# Patient Record
Sex: Female | Born: 1950 | Race: Black or African American | Marital: Single
Health system: Midwestern US, Community
[De-identification: ages and names within clinical notes are randomized; demographics above are authoritative.]

## PROBLEM LIST (undated history)

## (undated) DIAGNOSIS — Z1239 Encounter for other screening for malignant neoplasm of breast: Secondary | ICD-10-CM

## (undated) DIAGNOSIS — M25561 Pain in right knee: Secondary | ICD-10-CM

## (undated) DIAGNOSIS — F419 Anxiety disorder, unspecified: Secondary | ICD-10-CM

## (undated) DIAGNOSIS — M1711 Unilateral primary osteoarthritis, right knee: Secondary | ICD-10-CM

## (undated) DIAGNOSIS — G8929 Other chronic pain: Secondary | ICD-10-CM

## (undated) DIAGNOSIS — M792 Neuralgia and neuritis, unspecified: Secondary | ICD-10-CM

## (undated) DIAGNOSIS — R413 Other amnesia: Secondary | ICD-10-CM

## (undated) DIAGNOSIS — G4733 Obstructive sleep apnea (adult) (pediatric): Secondary | ICD-10-CM

## (undated) DIAGNOSIS — M545 Low back pain, unspecified: Secondary | ICD-10-CM

## (undated) DIAGNOSIS — J159 Unspecified bacterial pneumonia: Secondary | ICD-10-CM

## (undated) DIAGNOSIS — I2699 Other pulmonary embolism without acute cor pulmonale: Secondary | ICD-10-CM

## (undated) DIAGNOSIS — N63 Unspecified lump in unspecified breast: Secondary | ICD-10-CM

## (undated) DIAGNOSIS — R1310 Dysphagia, unspecified: Secondary | ICD-10-CM

## (undated) DIAGNOSIS — R059 Cough, unspecified: Secondary | ICD-10-CM

## (undated) DIAGNOSIS — Z09 Encounter for follow-up examination after completed treatment for conditions other than malignant neoplasm: Secondary | ICD-10-CM

## (undated) DIAGNOSIS — R079 Chest pain, unspecified: Secondary | ICD-10-CM

## (undated) DIAGNOSIS — E119 Type 2 diabetes mellitus without complications: Secondary | ICD-10-CM

## (undated) DIAGNOSIS — J4520 Mild intermittent asthma, uncomplicated: Secondary | ICD-10-CM

## (undated) DIAGNOSIS — Z9989 Dependence on other enabling machines and devices: Secondary | ICD-10-CM

## (undated) DIAGNOSIS — E7800 Pure hypercholesterolemia, unspecified: Secondary | ICD-10-CM

## (undated) DIAGNOSIS — Z981 Arthrodesis status: Secondary | ICD-10-CM

## (undated) DIAGNOSIS — J189 Pneumonia, unspecified organism: Principal | ICD-10-CM

## (undated) DIAGNOSIS — N390 Urinary tract infection, site not specified: Principal | ICD-10-CM

## (undated) DIAGNOSIS — M549 Dorsalgia, unspecified: Secondary | ICD-10-CM

## (undated) DIAGNOSIS — L309 Dermatitis, unspecified: Secondary | ICD-10-CM

## (undated) DIAGNOSIS — Z87442 Personal history of urinary calculi: Secondary | ICD-10-CM

## (undated) DIAGNOSIS — J309 Allergic rhinitis, unspecified: Secondary | ICD-10-CM

## (undated) DIAGNOSIS — R251 Tremor, unspecified: Secondary | ICD-10-CM

## (undated) DIAGNOSIS — M1990 Unspecified osteoarthritis, unspecified site: Secondary | ICD-10-CM

## (undated) DIAGNOSIS — G4730 Sleep apnea, unspecified: Secondary | ICD-10-CM

## (undated) DIAGNOSIS — M25572 Pain in left ankle and joints of left foot: Secondary | ICD-10-CM

## (undated) DIAGNOSIS — I499 Cardiac arrhythmia, unspecified: Secondary | ICD-10-CM

## (undated) DIAGNOSIS — G4700 Insomnia, unspecified: Secondary | ICD-10-CM

## (undated) DIAGNOSIS — F329 Major depressive disorder, single episode, unspecified: Secondary | ICD-10-CM

## (undated) DIAGNOSIS — Z86711 Personal history of pulmonary embolism: Secondary | ICD-10-CM

## (undated) DIAGNOSIS — D649 Anemia, unspecified: Secondary | ICD-10-CM

## (undated) DIAGNOSIS — F319 Bipolar disorder, unspecified: Secondary | ICD-10-CM

## (undated) DIAGNOSIS — R51 Headache: Secondary | ICD-10-CM

## (undated) DIAGNOSIS — F99 Mental disorder, not otherwise specified: Secondary | ICD-10-CM

## (undated) DIAGNOSIS — H1010 Acute atopic conjunctivitis, unspecified eye: Secondary | ICD-10-CM

## (undated) DIAGNOSIS — Z789 Other specified health status: Secondary | ICD-10-CM

## (undated) DIAGNOSIS — J45909 Unspecified asthma, uncomplicated: Secondary | ICD-10-CM

## (undated) DIAGNOSIS — I10 Essential (primary) hypertension: Secondary | ICD-10-CM

## (undated) DIAGNOSIS — F32A Depression, unspecified: Secondary | ICD-10-CM

## (undated) DIAGNOSIS — M25569 Pain in unspecified knee: Secondary | ICD-10-CM

## (undated) DIAGNOSIS — Z86718 Personal history of other venous thrombosis and embolism: Secondary | ICD-10-CM

## (undated) DIAGNOSIS — E785 Hyperlipidemia, unspecified: Secondary | ICD-10-CM

## (undated) DIAGNOSIS — N189 Chronic kidney disease, unspecified: Secondary | ICD-10-CM

## (undated) DIAGNOSIS — J069 Acute upper respiratory infection, unspecified: Secondary | ICD-10-CM

## (undated) DIAGNOSIS — F39 Unspecified mood [affective] disorder: Secondary | ICD-10-CM

## (undated) DIAGNOSIS — I209 Angina pectoris, unspecified: Secondary | ICD-10-CM

## (undated) HISTORY — DX: Tremor, unspecified: R25.1

## (undated) HISTORY — PX: SHOULDER ARTHROSCOPY: SHX128

## (undated) HISTORY — DX: Dermatitis, unspecified: L30.9

## (undated) HISTORY — DX: Type 2 diabetes mellitus without complications: E11.9

## (undated) HISTORY — DX: Personal history of urinary calculi: Z87.442

## (undated) HISTORY — PX: BREAST SURGERY: SHX581

## (undated) HISTORY — DX: Other amnesia: R41.3

## (undated) HISTORY — PX: NASAL SINUS SURGERY: SHX719

## (undated) HISTORY — DX: Personal history of pulmonary embolism: Z86.711

## (undated) HISTORY — DX: Dependence on other enabling machines and devices: Z99.89

## (undated) HISTORY — PX: TRIGGER FINGER RELEASE: SHX641

## (undated) HISTORY — PX: BACK SURGERY: SHX140

## (undated) HISTORY — DX: Pain in left ankle and joints of left foot: M25.572

## (undated) HISTORY — PX: HAMMER TOE SURGERY: SHX385

## (undated) HISTORY — DX: Acute atopic conjunctivitis, unspecified eye: H10.10

## (undated) HISTORY — PX: TUBAL LIGATION: SHX77

## (undated) HISTORY — DX: Other chronic pain: G89.29

## (undated) HISTORY — DX: Other specified health status: Z78.9

## (undated) HISTORY — PX: TOE SURGERY: SHX1073

## (undated) HISTORY — DX: Unspecified mood (affective) disorder: F39

## (undated) HISTORY — PX: BUNIONECTOMY: SHX129

## (undated) HISTORY — DX: Insomnia, unspecified: G47.00

## (undated) HISTORY — DX: Pain in unspecified knee: M25.569

## (undated) HISTORY — DX: Acute atopic conjunctivitis, unspecified eye: J30.9

## (undated) HISTORY — DX: Dorsalgia, unspecified: M54.9

## (undated) HISTORY — PX: ABDOMINAL HYSTERECTOMY: SHX81

## (undated) HISTORY — PX: CATARACT EXTRACTION: SUR2

## (undated) HISTORY — DX: Acute upper respiratory infection, unspecified: J06.9

## (undated) HISTORY — DX: Obstructive sleep apnea (adult) (pediatric): G47.33

## (undated) HISTORY — PX: OTHER SURGICAL HISTORY: SHX169

## (undated) HISTORY — PX: CARPAL TUNNEL RELEASE: SHX101

## (undated) HISTORY — DX: Hyperlipidemia, unspecified: E78.5

---

## 2001-01-24 ENCOUNTER — Encounter (INDEPENDENT_AMBULATORY_CARE_PROVIDER_SITE_OTHER): Payer: Self-pay

## 2001-01-24 ENCOUNTER — Other Ambulatory Visit: Admission: RE | Admit: 2001-01-24 | Discharge: 2001-01-24 | Payer: Self-pay | Admitting: Obstetrics and Gynecology

## 2001-02-07 ENCOUNTER — Ambulatory Visit (HOSPITAL_COMMUNITY): Admission: RE | Admit: 2001-02-07 | Discharge: 2001-02-07 | Payer: Self-pay | Admitting: Obstetrics and Gynecology

## 2001-02-07 ENCOUNTER — Encounter: Payer: Self-pay | Admitting: Obstetrics and Gynecology

## 2001-03-01 ENCOUNTER — Encounter: Payer: Self-pay | Admitting: Obstetrics and Gynecology

## 2001-03-07 ENCOUNTER — Encounter (INDEPENDENT_AMBULATORY_CARE_PROVIDER_SITE_OTHER): Payer: Self-pay | Admitting: Specialist

## 2001-03-07 ENCOUNTER — Inpatient Hospital Stay (HOSPITAL_COMMUNITY): Admission: RE | Admit: 2001-03-07 | Discharge: 2001-03-09 | Payer: Self-pay | Admitting: Obstetrics and Gynecology

## 2008-12-05 HISTORY — PX: NM MYOVIEW LTD: HXRAD82

## 2009-06-04 HISTORY — PX: TRANSTHORACIC ECHOCARDIOGRAM: SHX275

## 2010-12-10 ENCOUNTER — Encounter
Admission: RE | Admit: 2010-12-10 | Discharge: 2010-12-10 | Payer: Self-pay | Source: Home / Self Care | Attending: Neurosurgery | Admitting: Neurosurgery

## 2011-02-03 ENCOUNTER — Other Ambulatory Visit (HOSPITAL_COMMUNITY): Payer: Self-pay | Admitting: Neurosurgery

## 2011-02-03 ENCOUNTER — Ambulatory Visit (HOSPITAL_COMMUNITY)
Admission: RE | Admit: 2011-02-03 | Discharge: 2011-02-03 | Disposition: A | Discharge status: Home / Self Care | Payer: Medicaid Other | Source: Ambulatory Visit | Attending: Neurosurgery | Admitting: Neurosurgery

## 2011-02-03 ENCOUNTER — Encounter (HOSPITAL_COMMUNITY)
Admission: RE | Admit: 2011-02-03 | Discharge: 2011-02-03 | Disposition: A | Discharge status: Home / Self Care | Payer: Medicaid Other | Source: Ambulatory Visit | Attending: Neurosurgery | Admitting: Neurosurgery

## 2011-02-03 DIAGNOSIS — I10 Essential (primary) hypertension: Secondary | ICD-10-CM | POA: Insufficient documentation

## 2011-02-03 DIAGNOSIS — Z01810 Encounter for preprocedural cardiovascular examination: Secondary | ICD-10-CM | POA: Insufficient documentation

## 2011-02-03 DIAGNOSIS — Z01818 Encounter for other preprocedural examination: Secondary | ICD-10-CM | POA: Insufficient documentation

## 2011-02-03 DIAGNOSIS — J45909 Unspecified asthma, uncomplicated: Secondary | ICD-10-CM | POA: Insufficient documentation

## 2011-02-03 DIAGNOSIS — M5126 Other intervertebral disc displacement, lumbar region: Secondary | ICD-10-CM | POA: Insufficient documentation

## 2011-02-03 DIAGNOSIS — Z01811 Encounter for preprocedural respiratory examination: Secondary | ICD-10-CM | POA: Insufficient documentation

## 2011-02-03 DIAGNOSIS — Z01812 Encounter for preprocedural laboratory examination: Secondary | ICD-10-CM | POA: Insufficient documentation

## 2011-02-03 LAB — CBC
HCT: 39.7 % (ref 36.0–46.0)
MCH: 27.5 pg (ref 26.0–34.0)
MCHC: 31.7 g/dL (ref 30.0–36.0)
MCV: 86.7 fL (ref 78.0–100.0)
RDW: 14.8 % (ref 11.5–15.5)

## 2011-02-03 LAB — BASIC METABOLIC PANEL
BUN: 9 mg/dL (ref 6–23)
Calcium: 9.5 mg/dL (ref 8.4–10.5)
GFR calc non Af Amer: >60 mL/min (ref >60)
Glucose, Bld: 120 mg/dL — ABNORMAL HIGH (ref 70–99)
Potassium: 4.3 mEq/L (ref 3.5–5.1)

## 2011-02-03 LAB — SURGICAL PCR SCREEN: Staphylococcus aureus: NEGATIVE

## 2011-02-07 ENCOUNTER — Inpatient Hospital Stay (HOSPITAL_COMMUNITY): Payer: Medicaid Other

## 2011-02-07 ENCOUNTER — Inpatient Hospital Stay (HOSPITAL_COMMUNITY)
Admission: RE | Admit: 2011-02-07 | Discharge: 2011-02-09 | DRG: 460 | Disposition: A | Discharge status: Home / Self Care | Payer: Medicaid Other | Source: Ambulatory Visit | Attending: Neurosurgery | Admitting: Neurosurgery

## 2011-02-07 DIAGNOSIS — E119 Type 2 diabetes mellitus without complications: Secondary | ICD-10-CM | POA: Diagnosis present

## 2011-02-07 DIAGNOSIS — I10 Essential (primary) hypertension: Secondary | ICD-10-CM | POA: Diagnosis present

## 2011-02-07 DIAGNOSIS — Q762 Congenital spondylolisthesis: Secondary | ICD-10-CM

## 2011-02-07 DIAGNOSIS — M48061 Spinal stenosis, lumbar region without neurogenic claudication: Principal | ICD-10-CM | POA: Diagnosis present

## 2011-02-07 DIAGNOSIS — IMO0002 Reserved for concepts with insufficient information to code with codable children: Secondary | ICD-10-CM | POA: Diagnosis present

## 2011-02-07 LAB — GLUCOSE, CAPILLARY
Glucose-Capillary: 192 mg/dL — ABNORMAL HIGH (ref 70–99)
Glucose-Capillary: 207 mg/dL — ABNORMAL HIGH (ref 70–99)

## 2011-02-08 LAB — GLUCOSE, CAPILLARY
Glucose-Capillary: 205 mg/dL — ABNORMAL HIGH (ref 70–99)
Glucose-Capillary: 328 mg/dL — ABNORMAL HIGH (ref 70–99)

## 2011-02-09 LAB — GLUCOSE, CAPILLARY: Glucose-Capillary: 167 mg/dL — ABNORMAL HIGH (ref 70–99)

## 2011-02-10 NOTE — Op Note (Signed)
NAME:  Chelsea Benson, TERRY NO.:  [id number]  MEDICAL RECORD NO.:  [id number]           PATIENT TYPE:  I  LOCATION:  3013                         FACILITY:  [facility]  PHYSICIAN:  Donalee Citrin, M.D.        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  PROCEDURE:  Anterolateral fusion L4-5 using the NuVasive XLIF anterolateral system with a 10-mm lordotic x 45-mm PEEK cage packed with Actifuse and Osteocel, and then the posterior percutaneous pedicle screw placement unilaterally at L4-5 using the NuVasive SpheRX EXT percutaneous pedicle screw system on the left and open reduction spinal deformity.  SURGEON:  Donalee Citrin, MD  ASSISTANT:  Kathaleen Maser. Pool, MD  ANESTHESIA:  General endotracheal.  HISTORY OF PRESENT ILLNESS:  The patient is a very pleasant 59-year female who has had progressed worsening back and left leg pain, radiating down what appeared to be an L4 nerve root pattern.  MRI scan and subsequent CT myelography showed grade 1 spondylolisthesis at L4-5 with severe foraminal stenosis predominantly from the collapse and instability on flexion/extension views.  The patient is recommended anterolateral fusion with posterior augmentation with unilateral pedicle screws.  Risks and benefits of the operation were explained to the patient.  The patient understood and agreed to proceed forth.  The patient was brought to the OR, was induced general anesthesia without muscle relaxation, was positioned in left lateral decubitus with left side up.  Using fluoroscopy, the appropriate entry site for the L4- 5 disk space was selected.  The bed was broken and positioned, to careful not to over extend her psoas and over stretch her psoas.  After adequate prepping and draping was  achieved, 2 incisions were drawn out, one in the lateral placed overlying the disk space, and the other in the retroperitoneal, but a fingerbreadth away.  Both incisions were incised. Finger was passed through the retroperitoneal incision through the lumbodorsal fascial, the TP was palpated as well as the iliac crest and the 12th rib.  The retroperitoneal fat was then freed up and my finger passed over the psoas and then swept up into the lateral incision passing the dilator was stopped my finger down through the psoas with stimulation once I entered the superficial aspect of psoas and confirmed safe zone.  Then fluoroscopy confirmed.  Trajectory in position where one to be in the disk space docking of the fifth yard line.  Then again with direct stimulation, this gave me numbers that were all in the teens, passing dilators sequentially over the spot after K-wire anchored myself into the disk space again gave me stimulation and never exceeded or never was lower than the low teens.  At this point, the retractor blades were selected and retractor was put together and the retractors were passed over the dilator again under direct visualization and direct stimulation.  The posterior electrode and the retractor was stimulated as I was going down, this got me number down that  was similar to around 6, however, this was also felt to be safe.  This was then docked, I anchored in place and then under direct visualization, disk space was inspected.  A ball-tip probe again confirmed good positioning.  The retractor was positioned in a favor position for the diskectomy and the anterolateral fusion and again stimulation each step along the way never saw numbers less than 6 and all that was posterior from the posterior lip of the retractor blade and direction stimulation of the ball-tip probe always showed teens even in placement of shim, so at this point the shim was placed.  The disk space was incised  and then the disk space was cleaned out using up to 3 rongeurs, pushes and pulls, raft Cobbs to release the contralateral annulus as well as a 16-dilator to open up the disk space as well.  Then using trials of 8 standard and 10 Lordotic, 10 Lordotic was found to be the appropriate sizing with a 45-mm length. Then the cage was packed after adequate endplate preparation had been achieved.  Cages were packed with Actifuse mixed with Osteocel and then the cage was inserted.  Again, fluoroscopy was used along the way to confirm good positioning of the cage and after adequate positioning of cage had been achieved and there was no free run activity with the neuro monitoring, the retractor was collapsed down and removed, under direct visualization to confirm no significant hemorrhage.  The wound was copiously irrigated and no bleeding was identified medially prior to retractor being removed.  Then, the both incisions were closed with interrupted Vicryl.  The patient was flipped over prone for the posterior aspect of the case.  Then for posterior, again the patient was not relaxed, kept for stimulation.  Began using AP and lateral fluoroscopy, 2 incisions were mapped out to dock the diamond tip on the lateral margins of the facet complex for lateral entering of the pedicle.  Then using direct stimulation, advancing of the Jamshidi with neuro monitoring at each step along the way confirmed good placement from lateral to medial trajectory and then AP and lateral fluoroscopy confirmed good position in Jamshidi and the K-wire was placed and an sequential dilation was carried out and at each pedicle of the L4-L5 on the left and then each pedicle was tapped again with direct stimulation at each step along the way and then after through tapping two 65 x 45 screws were selected and inserted again with direct stimulation along the way.  After the screws were in placed adequate depth maintaining some  caudal on the screw heads, the subfascial knife was then placed, freeing up the space between the 2 screw heads.  The rod was then placed, tapped down with the checker and then a nut was anchored into the L5 screws subsequently into L4 screw.  Then the L4-L5 screws were compressed and final tightening was achieved at both screws.  Then, both the retractor systems were removed.  Postop fluoroscopy confirmed good position of screws, rods and bone graft, and then the wounds were copiously irrigated, meticulous hemostasis was maintained, and closed with interrupted Vicryl.          ______________________________ Donalee Citrin, M.D.     GC/MEDQ  D:  02/07/2011  T:  02/08/2011  Job:  [id number]  Electronically Signed by Donalee Citrin M.D. on 02/09/2011 04:02:18 PM

## 2011-02-20 NOTE — Discharge Summary (Signed)
  NAME:  Chelsea Benson, Chelsea Benson             ACCOUNT NO.:  [id number]  MEDICAL RECORD NO.:  [id number]           PATIENT TYPE:  I  LOCATION:  3013                         FACILITY:  [facility]  PHYSICIAN:  Donalee Citrin, M.D.        DATE OF BIRTH:  December 02, 1951  DATE OF ADMISSION:  02/07/2011 DATE OF DISCHARGE:  02/09/2011                              DISCHARGE SUMMARY   ADMITTING DIAGNOSIS:  Grade 1 spondylolisthesis and lumbar spinal stenosis, L4-5.  PROCEDURE:  Anterolateral interbody fusion L4-5 using NuVasive XLIF system and posterior pedicle screw fixation L4-5 using the NuVasive percutaneous pedicle screw system.  HOSPITAL COURSE:  The patient was admitted to the hospital as an EMA, went to the operating room, underwent the aforementioned procedure. Postoperatively, the patient did very well and recovered in the floor. On the floor, the patient convalesced well, was doing very well on the first postoperative day, had a little bit of thigh numbness and a little bit of initial iliopsoas weakness.  This was significantly improving over the 24-48 hours the patient was observed in the hospital.  She would ambulating with physical therapy.  She was voiding spontaneously. She was afebrile, and at the time of discharge her wounds were clean and dry and her pain was well controlled on pills.  The patient was stably discharged home on day 2 with scheduled followup in 1-2 weeks.  She will be discharged on oxycodone and Flexeril.          ______________________________ Donalee Citrin, M.D.     GC/MEDQ  D:  02/09/2011  T:  02/09/2011  Job:  [id number]  Electronically Signed by Donalee Citrin M.D. on 02/20/2011 03:56:47 PM

## 2011-03-22 ENCOUNTER — Ambulatory Visit
Admission: RE | Admit: 2011-03-22 | Discharge: 2011-03-22 | Disposition: A | Discharge status: Home / Self Care | Payer: Medicaid Other | Source: Ambulatory Visit | Attending: Neurosurgery | Admitting: Neurosurgery

## 2011-03-22 ENCOUNTER — Other Ambulatory Visit: Payer: Self-pay | Admitting: Neurosurgery

## 2011-03-22 DIAGNOSIS — M48061 Spinal stenosis, lumbar region without neurogenic claudication: Secondary | ICD-10-CM

## 2011-04-22 NOTE — H&P (Signed)
[facility]  Patient:    Chelsea Benson, Chelsea Benson                MRN: [id number] Adm. Date:  [id number] Attending:  Michaele Offer                         History and Physical  CHIEF COMPLAINT:  Symptomatic leiomyomatosis uterus.  HISTORY OF PRESENT ILLNESS:  This is a 60 year old, black female, gravida 3, para 2-0-1-2, who I saw for an annual examination on February 20 of this year. She states she previous had regular menses but her last menstrual period was normal and then she started bleeding one week later and had bleeding off and on since then.  She is sexually active without care of problems, has occasional slight urine leak and has normal bowel movements.  On physical examination, she had an 18-week size, irregular sized uterus, consistent with leiomyomatous.  An endometrial biopsy was performed which sounded to 8 cm and returned with benign mixed phase endometrium.  She had a pelvic ultrasound performed which reveals an 11 x 15 x 5 cm uterus with at least one large fibroid measuring 8 x 7 x 7 cm and the impression was that there were other fibroids that just could not be specifically measured.  The left ovary had a 4.6 x 3 cm simple ovarian cyst, and no other significant lesions.  Due to her irregular bleeding and significantly enlarged uterus, she desires definitive surgical therapy and is admitted for this at this time.  PAST OBSTETRICAL HISTORY:  In 1972, vaginal delivery at 34 weeks, 6 pound 4 ounces and pregnancy was complicated by hypertension.  In 1980 she had a vaginal delivery at 40 weeks and 8 pounds and no complications.  She has also had one spontaneous abortion which was treated with a D&C.  PAST MEDICAL HISTORY:  Hypertension and allergies.  Her primary care physician is Dr. Aneta Mins.  PAST SURGICAL HISTORY:  Tubal ligation.  Removal of a cyst from her right leg, no surgery.  The previously mentioned D&C.  Left breast  biopsy and right shoulder surgery.  CURRENT MEDICATIONS: 1. Tiazac 360 mg p.o. q.d. 2. Hydrochlorothiazide 25 mg p.o. q.d. 3. Allegra-D one p.o. q.d.  ALLERGIES:  ______  GYNECOLOGIC HISTORY:  Last Pap smear was 1996 prior to this recent Pap smear which was normal with benign reactive changes.  She has a history of Chlamydia.  REVIEW OF SYSTEMS:  Significant for the above-mentioned occasional slight urine leak.  Normal bowel movements and is otherwise negative.  FAMILY HISTORY:  Maternal grandmother with breast cancer and a sister with uterine cancer.  SOCIAL HISTORY:  The patient is single and denies alcohol, tobacco, or drug use.  PHYSICAL EXAMINATION:  VITAL SIGNS:  Weight is 148 pounds.  Blood pressure is 146/100, pulse was 72.  GENERAL:  She is a well-developed, well-nourished, black female, who is in no acute distress.  HEENT:  Pupils are equally round and reactive to light and accommodation and her extraocular muscles are intact.  Oropharynx is clear without erythema or exudates.  NECK:  Supple without lymphadenopathy or thyromegaly.  LUNGS:  Clear to auscultation.  HEART:  Regular rate and rhythm without murmur.  BREASTS:  Examination in the sitting and supine position reveals no dominant masses, adenopathy, skin change, or nipple discharge.  ABDOMEN:  Soft and nontender, nondistended and her uterus is palpable 1 cm beneath her umbilicus.  EXTREMITIES:  Trace edema, are nontender and DTRs are 2/4 and symmetric.  PELVIC:  On pelvic examination, external genitalia reveals no lesions.  On speculum examination, her cervix appears normal and the Pap smear performed was normal.  She has a grade 1-2 cystocele and a grade 1 rectocele. On bimanual examination, she has an approximately 18-week size irregular uterus with palpable myomas and no adnexal masses.  Rectovaginal confirms this.  ASSESSMENT:  Irregular bleeding due to a leiomyomatous uterus that  is approximately 18-week size.  The patient also has a cystocele and rectocele and desires definitive surgical therapy.  Risks of surgery including bleeding, infection and damage to surrounding organs have been discussed with the patient.  PLAN:  Admit the patient on the day of surgery for a total abdominal hysterectomy with bilateral salpingo-oophorectomy followed by anterior and posterior colporrhaphy. DD:  03/06/01 TD:  03/07/01 Job: [id number]

## 2011-04-22 NOTE — Op Note (Signed)
[facility]  Patient:    Chelsea Benson, Chelsea Benson                MRN: [id number] Proc. Date: 03/07/01 Adm. Date:  [id number] Attending:  Michaele Offer                           Operative Report  PREOPERATIVE DIAGNOSES:  Symptomatic leiomyomatous uterus and left ovarian cyst, cystocele and rectocele.  POSTOPERATIVE DIAGNOSES:  Symptomatic leiomyomatous uterus and left ovarian cyst, cystocele and rectocele.  PROCEDURE PERFORMED:  Total abdominal hysterectomy, bilateral salpingo-oophorectomy, anterior and posterior colporrhaphy.  SURGEON:  Zenaida Niece, M.D.  ASSISTANT:  Alvino Chapel, M.D.  ANESTHESIA:  General endotracheal tube.  ESTIMATED BLOOD LOSS:  600 cc.  CHEMOPROPHYLAXIS:  Ancef 1 g prior to surgery.  FINDINGS:  Significantly enlarged uterus with multiple fibroids.  One significant fibroid was coming off the anterior portion of the uterus in a subserosal location and was as big as the uterus measuring approximately eight weeks size and the area had other multiple fibroids.  The omentum was adherent to this large fibroid.  She had a grade 2 cystocele and a grade 1 rectocele. Counts were correct and condition was stable.  DESCRIPTION OF PROCEDURE:  After appropriate informed consent was obtained, the patient was taken to the operating room and placed in the dorsosupine position.  General anesthesia was induced and she was placed in mobile stirrups.  Her abdomen, perineum, and vagina were then prepped and draped in the usual sterile fashion.  Her abdomen was then entered via a standard Pfannenstiel incision.  A self-retaining retractor was placed including a bladder blade and the bowels were packed into the upper abdomen.  The omentum was freed from the large fibroid using the PK field clamp with adequate hemostasis and good division of the adhesions, and no significant bleeding. This fibroid was then clamped at its  origin from the uterus with two Heaneys.  It was then removed sharply and these pedicles were sutured with #1 chromic. Kelly clamps were placed on each uterine cornua and the uterus was elevated. Both round ligaments were divided with electrocautery and the peritoneum was connected across the anterior portion of the uterus to push the bladder inferior.  There was noted to be fibroid in the left broad ligament.  The infundibulopelvic ligaments were isolated, clamped, transected, and doubly ligated with #1 chromic with adequate hemostasis.  The uterine arteries were then skeletonized and using the PK field device, they were clamped, coagulated, and cut.  Due to the significant size of these vessels and the large fibroids, this device did not seem to work very well.  Thus, the uterine arteries were reclamped and ligated with #1 chromic.  Cardinal ligaments and uterosacral ligaments were clamped, transected, and ligated with #1 chromic. The bladder was pushed well inferior.  The vaginal angle was then clamped on her right side and the vagina was entered.  The remainder of the cervix was removed sharply.  Vaginal angles were sutured with #1 chromic and the remainder of the vagina was closed with running locking #1 chromic with adequate hemostasis.  The pelvis was copiously irrigated and all pedicles inspected and found to be hemostatic.  The bowel packs were removed and the omentum was pulled down beneath the incision.  The subfascial space was irrigated and made hemostatic with electrocautery.  The fascia was closed in a running fashion starting at both ends and  meeting in the middle with 0 Vicryl. The subcutaneous tissue was then irrigated and made hemostatic with electrocautery.  The skin was then closed with staples.  Please note that prior to irrigation of the pelvis, the uterosacral ligaments were had been plicated in the midline with one suture of 0 silk with good reapproximation. A towel  was placed over the abdominal incision and the legs were raised in the mobile stirrups.  A weighted speculum was inserted into the vagina.  The anterior vaginal apex was grasped with long Allis and a small portion of vaginal mucosa was removed sharply.  The vaginal mucosa was then dissected in the midline from the vaginal apex to within 2 cm of the urethral meatus.  The vaginal mucosa was then freed up laterally with sharp and blunt dissection to mobilize the cystocele.  Bleeding was controlled with electrocautery.  The cystocele was then reduced with interrupted sutures of 2-0 Vicryl with adequate reduction.  Excess vaginal mucosa was removed and the vagina was closed with running locking 2-0 Vicryl to the vaginal apex.  The weighted speculum was then removed and Allis clamps were used to grab the hymenal ring at a distance that would allow two fingers to easily pass into the vagina.  A piece of vaginal mucosa was removed sharply.  The vagina was then dissected from the underlying tissue in the midline from the hymenal ring to the vaginal apex.  The vagina was then dissected laterally, sharply, and bluntly to mobilize the rectocele.  A finger was inserted into the rectum with a clean glove and there was no injury to the rectum and no evidence of an enterocele. The rectocele was then reduced with interrupted sutures of 2-0 Vicryl with good support.  Excess vaginal mucosa was removed and the vagina was closed with running locking 2-0 Vicryl from the vaginal apex to the hymenal ring. The vagina was then inspected and found to be hemostatic.  The vagina was then packed with 2 inch Iodoform gauze.  The patient was then taken down from stirrups, extubated in the operating room, and taken to the recovery room in stable condition after tolerating the procedure well. DD:  03/07/01 TD:  03/07/01 Job: [id number]

## 2011-04-22 NOTE — Discharge Summary (Signed)
[facility]  Patient:    Chelsea Benson, Chelsea Benson                MRN: [id number] Adm. Date:  [id number] Disc. Date: [id number] Attending:  Michaele Offer                           Discharge Summary  ADMISSION DIAGNOSIS:  Symptomatic leiomyomatous uterus, left ovarian cyst, cystocele, and rectocele.  DISCHARGE DIAGNOSIS:  Symptomatic leiomyomatous uterus, left ovarian cyst, cystocele, and rectocele.  PROCEDURES:  Total abdominal hysterectomy, bilateral salpingo-oophorectomy, anterior and posterior colporrhaphy.  COMPLICATIONS:  None.  CONSULTATIONS:  None.  HISTORY OF PRESENT ILLNESS:  Briefly, this is a 60 year old black female, gravida 3, para 2-0-1-2, with recent abnormal uterine bleeding.  She had an 18-week-size, irregular-sized uterus on exam and a benign endometrial biopsy. Ultrasound reveals an 11 x 15 x 5 cm uterus with at least one large fibroid measuring 8 x 7 x 7 cm as well as a 4.6 x 3 cm left ovarian cyst.  Patient is admitted for definitive surgical therapy.  PAST MEDICAL HISTORY:  Significant for two vaginal deliveries without complications and one spontaneous abortion.  Medical history significant for hypertension and allergies.  PAST SURGICAL HISTORY:  Tubal ligation, removal of a cyst from her right leg, D&C, left breast biopsy, and right shoulder surgery.  MEDICATIONS: 1. Tiazac 360 mg q.d. 2. Hydrochlorothiazide 25 mg q.d. 3. Allegra D q.d.  PHYSICAL EXAMINATION:  Significant for a benign abdomen with a uterus palpable 1 cm below her umbilicus.  On pelvic exam, she has a grade 1 to 2 cystocele and grade 1 rectocele.  Uterus is approximately 18 weeks size and irregular, and she does have a fullness in her left adnexa.  HOSPITAL COURSE:  Patient was admitted on the day of surgery and underwent a TAH-BSO with anterior and posterior repair under general anesthesia. Estimated blood loss was 600 cc.  There was an enlarged  uterus with multiple fibroids, with a large subserosal fundal myoma which had omentum adherent to it.  She had a grade 1 to 2 cystocele, grade 1 rectocele, and a simple left ovarian cyst and a normal right ovary.   Surgery went without complications. Postoperatively, the patient did very well.  On postoperative day #1, her vaginal packing was removed with minimal stain.  Preoperative hemoglobin 11.7, postoperative was 9.4.  By the afternoon of postoperative day #2, she was ambulating, tolerating a regular diet, and had adequate pain control, and was felt to be stable enough for discharge home.  CONDITION ON DISCHARGE:  Stable.  DISPOSITION:  Discharged to home.  DISCHARGE INSTRUCTIONS:  Her diet is a regular diet.  Her activity is pelvic rest, no strenuous activity, and no driving.  Follow-up is in three to four days for staple removal.  Medications are Percocet p.r.n. pain and estradiol 1 mg p.o. q.d.  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  This tumor did have mitotic activity that was not associated with significant cytologic atypia.  She had a normal endometrium, adenomyosis, intramural leiomyomata, normal right tube and ovary and left ovary had a benign ovarian follicular cyst and a benign paratubal cyst. DD:  03/17/01 TD:  03/17/01 Job: [id number]

## 2011-05-03 ENCOUNTER — Other Ambulatory Visit: Payer: Self-pay | Admitting: Neurosurgery

## 2011-05-03 ENCOUNTER — Ambulatory Visit
Admission: RE | Admit: 2011-05-03 | Discharge: 2011-05-03 | Disposition: A | Discharge status: Home / Self Care | Payer: Medicaid Other | Source: Ambulatory Visit | Attending: Neurosurgery | Admitting: Neurosurgery

## 2011-05-03 DIAGNOSIS — M545 Low back pain: Secondary | ICD-10-CM

## 2011-08-02 ENCOUNTER — Other Ambulatory Visit: Payer: Self-pay | Admitting: Neurosurgery

## 2011-08-02 ENCOUNTER — Ambulatory Visit
Admission: RE | Admit: 2011-08-02 | Discharge: 2011-08-02 | Disposition: A | Discharge status: Home / Self Care | Payer: Medicaid Other | Source: Ambulatory Visit | Attending: Neurosurgery | Admitting: Neurosurgery

## 2011-08-02 DIAGNOSIS — M549 Dorsalgia, unspecified: Secondary | ICD-10-CM

## 2012-01-09 ENCOUNTER — Other Ambulatory Visit: Payer: Self-pay | Admitting: Neurosurgery

## 2012-01-09 DIAGNOSIS — M549 Dorsalgia, unspecified: Secondary | ICD-10-CM

## 2012-01-12 ENCOUNTER — Ambulatory Visit
Admission: RE | Admit: 2012-01-12 | Discharge: 2012-01-12 | Disposition: A | Discharge status: Home / Self Care | Payer: Medicaid Other | Source: Ambulatory Visit | Attending: Neurosurgery | Admitting: Neurosurgery

## 2012-01-12 VITALS — BP 135/83 | HR 70 | Temp 97.4°F | Ht 65.0 in | Wt 164.0 lb

## 2012-01-12 DIAGNOSIS — M549 Dorsalgia, unspecified: Secondary | ICD-10-CM

## 2012-01-12 MED ORDER — DIAZEPAM 5 MG PO TABS
10.0000 mg | ORAL_TABLET | Freq: Once | ORAL | Status: AC
  Administered 2012-01-12: 10 mg via ORAL

## 2012-01-12 MED ORDER — ONDANSETRON HCL 4 MG/2ML IJ SOLN
4.0000 mg | Freq: Once | INTRAMUSCULAR | Status: AC
  Administered 2012-01-12: 4 mg via INTRAMUSCULAR

## 2012-01-12 MED ORDER — HYDROMORPHONE HCL PF 2 MG/ML IJ SOLN
1.5000 mg | Freq: Once | INTRAMUSCULAR | Status: AC
  Administered 2012-01-12: 1.5 mg via INTRAMUSCULAR

## 2012-01-12 MED ORDER — ONDANSETRON HCL 4 MG/2ML IJ SOLN: 4.0000 mg | Freq: Four times a day (QID) | INTRAMUSCULAR | Status: DC | PRN

## 2012-01-12 NOTE — Progress Notes (Addendum)
Patient waiting in nursing station to proceed with myelogram when she stated she thought she was going to throw up.  Patient did vomit small amount clear emesis into trash can.  Emesis tinged with peachy color, assumed to be valium she had been administered 10-15 minutes earlier.  Medicated for nausea.  Patient doesn't feel sick, just says it's her nerves and withdrawal from being off Prozac the past two days for the myelogram.   As an aside, patient states she does get good pain relief from her Hydrocodone 10/325.  jkl

## 2012-07-27 ENCOUNTER — Other Ambulatory Visit: Payer: Self-pay | Admitting: Neurosurgery

## 2012-07-27 DIAGNOSIS — IMO0002 Reserved for concepts with insufficient information to code with codable children: Secondary | ICD-10-CM

## 2012-07-31 ENCOUNTER — Ambulatory Visit
Admission: RE | Admit: 2012-07-31 | Discharge: 2012-07-31 | Disposition: A | Discharge status: Home / Self Care | Payer: Medicare Other | Source: Ambulatory Visit | Attending: Neurosurgery | Admitting: Neurosurgery

## 2012-07-31 DIAGNOSIS — IMO0002 Reserved for concepts with insufficient information to code with codable children: Secondary | ICD-10-CM

## 2012-07-31 MED ORDER — GADOBENATE DIMEGLUMINE 529 MG/ML IV SOLN
15.0000 mL | Freq: Once | INTRAVENOUS | Status: AC | PRN
  Administered 2012-07-31: 15 mL via INTRAVENOUS

## 2012-08-17 ENCOUNTER — Other Ambulatory Visit: Payer: Self-pay | Admitting: Neurosurgery

## 2012-08-21 ENCOUNTER — Other Ambulatory Visit: Payer: Self-pay | Admitting: Neurosurgery

## 2012-08-31 ENCOUNTER — Other Ambulatory Visit (HOSPITAL_COMMUNITY): Payer: Medicare Other

## 2012-09-12 ENCOUNTER — Inpatient Hospital Stay: Admit: 2012-09-12 | Payer: Self-pay | Admitting: Neurosurgery

## 2012-09-12 SURGERY — POSTERIOR LUMBAR FUSION 1 LEVEL
Anesthesia: General | Site: Back

## 2012-09-27 ENCOUNTER — Other Ambulatory Visit: Payer: Self-pay | Admitting: Neurosurgery

## 2012-10-02 ENCOUNTER — Other Ambulatory Visit (HOSPITAL_COMMUNITY): Payer: Medicare Other

## 2012-10-04 ENCOUNTER — Encounter (HOSPITAL_COMMUNITY): Payer: Self-pay

## 2012-10-04 ENCOUNTER — Ambulatory Visit (HOSPITAL_COMMUNITY)
Admission: RE | Admit: 2012-10-04 | Discharge: 2012-10-04 | Disposition: A | Discharge status: Home / Self Care | Payer: Medicare Other | Source: Ambulatory Visit | Attending: Anesthesiology | Admitting: Anesthesiology

## 2012-10-04 ENCOUNTER — Encounter (HOSPITAL_COMMUNITY)
Admission: RE | Admit: 2012-10-04 | Discharge: 2012-10-04 | Disposition: A | Discharge status: Home / Self Care | Payer: Medicare Other | Source: Ambulatory Visit | Attending: Neurosurgery | Admitting: Neurosurgery

## 2012-10-04 DIAGNOSIS — Z01818 Encounter for other preprocedural examination: Secondary | ICD-10-CM | POA: Insufficient documentation

## 2012-10-04 HISTORY — DX: Mental disorder, not otherwise specified: F99

## 2012-10-04 HISTORY — DX: Pneumonia, unspecified organism: J18.9

## 2012-10-04 HISTORY — DX: Anxiety disorder, unspecified: F41.9

## 2012-10-04 HISTORY — DX: Chronic kidney disease, unspecified: N18.9

## 2012-10-04 HISTORY — DX: Unspecified asthma, uncomplicated: J45.909

## 2012-10-04 HISTORY — DX: Unspecified osteoarthritis, unspecified site: M19.90

## 2012-10-04 HISTORY — DX: Essential (primary) hypertension: I10

## 2012-10-04 HISTORY — DX: Headache: R51

## 2012-10-04 HISTORY — DX: Type 2 diabetes mellitus without complications: E11.9

## 2012-10-04 LAB — CBC
HCT: 37.7 % (ref 36.0–46.0)
Hemoglobin: 12 g/dL (ref 12.0–15.0)
MCH: 26.7 pg (ref 26.0–34.0)
MCHC: 31.8 g/dL (ref 30.0–36.0)
WBC: 5.8 10*3/uL (ref 4.0–10.5)

## 2012-10-04 LAB — BASIC METABOLIC PANEL
CO2: 26 mEq/L (ref 19–32)
Calcium: 9.5 mg/dL (ref 8.4–10.5)
Creatinine, Ser: 0.71 mg/dL (ref 0.50–1.10)
GFR calc Af Amer: >90 mL/min (ref >90)

## 2012-10-04 LAB — TYPE AND SCREEN
ABO/RH(D): A POS
Antibody Screen: NEGATIVE

## 2012-10-04 LAB — SURGICAL PCR SCREEN
MRSA, PCR: NEGATIVE
Staphylococcus aureus: POSITIVE — AB

## 2012-10-04 NOTE — Pre-Procedure Instructions (Signed)
20 TYLIN [address]ADLEY  10/04/2012   Your procedure is scheduled on:  Monday  10/08/12   Report to [facility] at 1130 AM.  Call this number if you have problems the morning of surgery: [phone]   Remember:   Do not eat food OR DRINK :After Midnight   Take these medicines the morning of surgery with A SIP OF WATER: may use your inhalers, dilitazem, prozac,hydroxyzine, pain medicine if needed    Do not wear jewelry, make-up or nail polish.  Do not wear lotions, powders, or perfumes. You may wear deodorant.  Do not shave 48 hours prior to surgery.   Do not bring valuables to the hospital.  Contacts, dentures or bridgework may not be worn into surgery.  Leave suitcase in the car. After surgery it may be brought to your room.  For patients admitted to the hospital, checkout time is 11:00 AM the day of discharge.   Patients discharged the day of surgery will not be allowed to drive home.  Name and phone number of your driver: /w friend   Special Instructions: Shower using CHG 2 nights before surgery and the night before surgery.  If you shower the day of surgery use CHG.  Use special wash - you have one bottle of CHG for all showers.  You should use approximately 1/3 of the bottle for each shower.   Please read over the following fact sheets that you were given: Pain Booklet, Coughing and Deep Breathing, Blood Transfusion Information, Total Joint Packet, MRSA Information and Surgical Site Infection Prevention

## 2012-10-05 NOTE — Progress Notes (Signed)
2nd Request made for [facility] for cardiac records.

## 2012-10-07 MED ORDER — CEFAZOLIN SODIUM-DEXTROSE 2-3 GM-% IV SOLR
2.0000 g | INTRAVENOUS | Status: AC
  Administered 2012-10-08: 2 g via INTRAVENOUS
  Filled 2012-10-07: qty 50

## 2012-10-08 ENCOUNTER — Inpatient Hospital Stay (HOSPITAL_COMMUNITY): Payer: Medicare Other | Admitting: Anesthesiology

## 2012-10-08 ENCOUNTER — Inpatient Hospital Stay (HOSPITAL_COMMUNITY): Payer: Medicare Other

## 2012-10-08 ENCOUNTER — Encounter (HOSPITAL_COMMUNITY): Payer: Self-pay | Admitting: Anesthesiology

## 2012-10-08 ENCOUNTER — Inpatient Hospital Stay (HOSPITAL_COMMUNITY)
Admission: RE | Admit: 2012-10-08 | Discharge: 2012-10-11 | DRG: 460 | Disposition: A | Discharge status: Home Health | Payer: Medicare Other | Source: Ambulatory Visit | Attending: Neurosurgery | Admitting: Neurosurgery

## 2012-10-08 ENCOUNTER — Encounter (HOSPITAL_COMMUNITY): Payer: Self-pay | Admitting: *Deleted

## 2012-10-08 ENCOUNTER — Encounter (HOSPITAL_COMMUNITY)
Admission: RE | Disposition: A | Discharge status: Home Health | Payer: Self-pay | Source: Ambulatory Visit | Attending: Neurosurgery

## 2012-10-08 DIAGNOSIS — Z23 Encounter for immunization: Secondary | ICD-10-CM

## 2012-10-08 DIAGNOSIS — Z87442 Personal history of urinary calculi: Secondary | ICD-10-CM

## 2012-10-08 DIAGNOSIS — M47817 Spondylosis without myelopathy or radiculopathy, lumbosacral region: Principal | ICD-10-CM | POA: Diagnosis present

## 2012-10-08 DIAGNOSIS — Y92009 Unspecified place in unspecified non-institutional (private) residence as the place of occurrence of the external cause: Secondary | ICD-10-CM

## 2012-10-08 DIAGNOSIS — J45909 Unspecified asthma, uncomplicated: Secondary | ICD-10-CM | POA: Diagnosis present

## 2012-10-08 DIAGNOSIS — E119 Type 2 diabetes mellitus without complications: Secondary | ICD-10-CM | POA: Diagnosis present

## 2012-10-08 DIAGNOSIS — I10 Essential (primary) hypertension: Secondary | ICD-10-CM | POA: Diagnosis present

## 2012-10-08 DIAGNOSIS — T84498A Other mechanical complication of other internal orthopedic devices, implants and grafts, initial encounter: Secondary | ICD-10-CM | POA: Diagnosis present

## 2012-10-08 DIAGNOSIS — Y831 Surgical operation with implant of artificial internal device as the cause of abnormal reaction of the patient, or of later complication, without mention of misadventure at the time of the procedure: Secondary | ICD-10-CM | POA: Diagnosis present

## 2012-10-08 DIAGNOSIS — F411 Generalized anxiety disorder: Secondary | ICD-10-CM | POA: Diagnosis present

## 2012-10-08 DIAGNOSIS — M51379 Other intervertebral disc degeneration, lumbosacral region without mention of lumbar back pain or lower extremity pain: Secondary | ICD-10-CM | POA: Diagnosis present

## 2012-10-08 DIAGNOSIS — M5137 Other intervertebral disc degeneration, lumbosacral region: Secondary | ICD-10-CM | POA: Diagnosis present

## 2012-10-08 DIAGNOSIS — Z79899 Other long term (current) drug therapy: Secondary | ICD-10-CM

## 2012-10-08 LAB — GLUCOSE, CAPILLARY: Glucose-Capillary: 125 mg/dL — ABNORMAL HIGH (ref 70–99)

## 2012-10-08 SURGERY — POSTERIOR LUMBAR FUSION 1 LEVEL
Anesthesia: General | Site: Back | Wound class: Clean

## 2012-10-08 MED ORDER — ONDANSETRON HCL 4 MG/2ML IJ SOLN: 4.0000 mg | Freq: Once | INTRAMUSCULAR | Status: DC | PRN

## 2012-10-08 MED ORDER — DICYCLOMINE HCL 20 MG PO TABS
10.0000 mg | ORAL_TABLET | Freq: Four times a day (QID) | ORAL | Status: DC | PRN
  Filled 2012-10-08: qty 1

## 2012-10-08 MED ORDER — MONTELUKAST SODIUM 10 MG PO TABS
10.0000 mg | ORAL_TABLET | Freq: Every day | ORAL | Status: DC
  Administered 2012-10-08 – 2012-10-10 (×3): 10 mg via ORAL
  Filled 2012-10-08 (×4): qty 1

## 2012-10-08 MED ORDER — EPHEDRINE SULFATE 50 MG/ML IJ SOLN
INTRAMUSCULAR | Status: DC | PRN
  Administered 2012-10-08 (×2): 10 mg via INTRAVENOUS

## 2012-10-08 MED ORDER — SODIUM CHLORIDE 0.9 % IJ SOLN
3.0000 mL | Freq: Two times a day (BID) | INTRAMUSCULAR | Status: DC
  Administered 2012-10-09 – 2012-10-11 (×3): 3 mL via INTRAVENOUS

## 2012-10-08 MED ORDER — METFORMIN HCL 500 MG PO TABS
1000.0000 mg | ORAL_TABLET | Freq: Two times a day (BID) | ORAL | Status: DC
  Administered 2012-10-08 – 2012-10-11 (×6): 1000 mg via ORAL
  Filled 2012-10-08 (×8): qty 2

## 2012-10-08 MED ORDER — HEMOSTATIC AGENTS (NO CHARGE) OPTIME
TOPICAL | Status: DC | PRN
  Administered 2012-10-08 (×2): 1 via TOPICAL

## 2012-10-08 MED ORDER — ACETAMINOPHEN 10 MG/ML IV SOLN
1000.0000 mg | Freq: Once | INTRAVENOUS | Status: AC | PRN
  Administered 2012-10-08: 1000 mg via INTRAVENOUS

## 2012-10-08 MED ORDER — MENTHOL 3 MG MT LOZG: 1.0000 | LOZENGE | OROMUCOSAL | Status: DC | PRN

## 2012-10-08 MED ORDER — SIMVASTATIN 40 MG PO TABS
40.0000 mg | ORAL_TABLET | Freq: Every day | ORAL | Status: DC
  Filled 2012-10-08: qty 1

## 2012-10-08 MED ORDER — DILTIAZEM HCL ER COATED BEADS 360 MG PO CP24
360.0000 mg | ORAL_CAPSULE | Freq: Every day | ORAL | Status: DC
  Administered 2012-10-09 – 2012-10-11 (×3): 360 mg via ORAL
  Filled 2012-10-08 (×4): qty 1

## 2012-10-08 MED ORDER — ATORVASTATIN CALCIUM 20 MG PO TABS
20.0000 mg | ORAL_TABLET | Freq: Every day | ORAL | Status: DC
  Administered 2012-10-09 – 2012-10-11 (×3): 20 mg via ORAL
  Filled 2012-10-08 (×4): qty 1

## 2012-10-08 MED ORDER — PHENOL 1.4 % MT LIQD: 1.0000 | OROMUCOSAL | Status: DC | PRN

## 2012-10-08 MED ORDER — DICYCLOMINE HCL 20 MG PO TABS
10.0000 mg | ORAL_TABLET | Freq: Three times a day (TID) | ORAL | Status: DC
  Filled 2012-10-08 (×3): qty 1

## 2012-10-08 MED ORDER — DEXAMETHASONE SODIUM PHOSPHATE 10 MG/ML IJ SOLN
INTRAMUSCULAR | Status: AC
  Administered 2012-10-08: 10 mg via INTRAVENOUS
  Filled 2012-10-08: qty 1

## 2012-10-08 MED ORDER — BUPIVACAINE HCL (PF) 0.25 % IJ SOLN
INTRAMUSCULAR | Status: DC | PRN
  Administered 2012-10-08: 10 mL

## 2012-10-08 MED ORDER — DILTIAZEM HCL ER BEADS 240 MG PO CP24: 360.0000 mg | ORAL_CAPSULE | Freq: Every day | ORAL | Status: DC

## 2012-10-08 MED ORDER — HYDROXYZINE HCL 25 MG PO TABS
100.0000 mg | ORAL_TABLET | Freq: Every evening | ORAL | Status: DC | PRN
  Administered 2012-10-08: 100 mg via ORAL
  Filled 2012-10-08 (×2): qty 4

## 2012-10-08 MED ORDER — ACETAMINOPHEN 10 MG/ML IV SOLN
INTRAVENOUS | Status: AC
  Filled 2012-10-08: qty 100

## 2012-10-08 MED ORDER — NEOSTIGMINE METHYLSULFATE 1 MG/ML IJ SOLN
INTRAMUSCULAR | Status: DC | PRN
  Administered 2012-10-08: 3 mg via INTRAVENOUS

## 2012-10-08 MED ORDER — HYDROMORPHONE HCL PF 1 MG/ML IJ SOLN
0.5000 mg | INTRAMUSCULAR | Status: DC | PRN
  Administered 2012-10-08 – 2012-10-10 (×6): 1 mg via INTRAVENOUS
  Filled 2012-10-08 (×6): qty 1

## 2012-10-08 MED ORDER — CEFAZOLIN SODIUM 1-5 GM-% IV SOLN
1.0000 g | Freq: Three times a day (TID) | INTRAVENOUS | Status: AC
  Administered 2012-10-08 – 2012-10-10 (×6): 1 g via INTRAVENOUS
  Filled 2012-10-08 (×6): qty 50

## 2012-10-08 MED ORDER — ACETAMINOPHEN 650 MG RE SUPP
650.0000 mg | RECTAL | Status: DC | PRN
  Filled 2012-10-08: qty 1

## 2012-10-08 MED ORDER — MIRTAZAPINE 30 MG PO TABS
30.0000 mg | ORAL_TABLET | Freq: Every day | ORAL | Status: DC
  Administered 2012-10-08 – 2012-10-10 (×3): 30 mg via ORAL
  Filled 2012-10-08 (×4): qty 1

## 2012-10-08 MED ORDER — LIDOCAINE HCL (CARDIAC) 20 MG/ML IV SOLN
INTRAVENOUS | Status: DC | PRN
  Administered 2012-10-08: 50 mg via INTRAVENOUS

## 2012-10-08 MED ORDER — BACITRACIN 50000 UNITS IM SOLR
INTRAMUSCULAR | Status: AC
  Filled 2012-10-08: qty 1

## 2012-10-08 MED ORDER — THROMBIN 20000 UNITS EX SOLR
CUTANEOUS | Status: DC | PRN
  Administered 2012-10-08 (×2): via TOPICAL

## 2012-10-08 MED ORDER — PROPOFOL 10 MG/ML IV BOLUS
INTRAVENOUS | Status: DC | PRN
  Administered 2012-10-08: 150 mg via INTRAVENOUS

## 2012-10-08 MED ORDER — HYDROMORPHONE HCL PF 1 MG/ML IJ SOLN
0.2500 mg | INTRAMUSCULAR | Status: DC | PRN
  Administered 2012-10-08: 0.5 mg via INTRAVENOUS
  Administered 2012-10-08: 1 mg via INTRAVENOUS
  Administered 2012-10-08: 0.5 mg via INTRAVENOUS

## 2012-10-08 MED ORDER — FLUTICASONE PROPIONATE HFA 44 MCG/ACT IN AERO
1.0000 | INHALATION_SPRAY | Freq: Two times a day (BID) | RESPIRATORY_TRACT | Status: DC
  Administered 2012-10-09 – 2012-10-11 (×5): 1 via RESPIRATORY_TRACT
  Filled 2012-10-08 (×2): qty 10.6

## 2012-10-08 MED ORDER — ACETAMINOPHEN 10 MG/ML IV SOLN
1000.0000 mg | Freq: Once | INTRAVENOUS | Status: DC
  Filled 2012-10-08: qty 100

## 2012-10-08 MED ORDER — OXYCODONE-ACETAMINOPHEN 5-325 MG PO TABS
2.0000 | ORAL_TABLET | ORAL | Status: AC | PRN
  Administered 2012-10-08: 2 via ORAL

## 2012-10-08 MED ORDER — SODIUM CHLORIDE 0.9 % IJ SOLN: 3.0000 mL | INTRAMUSCULAR | Status: DC | PRN

## 2012-10-08 MED ORDER — FLUOXETINE HCL 20 MG PO CAPS
20.0000 mg | ORAL_CAPSULE | Freq: Every day | ORAL | Status: DC
  Administered 2012-10-09 – 2012-10-11 (×3): 20 mg via ORAL
  Filled 2012-10-08 (×4): qty 1

## 2012-10-08 MED ORDER — ACETAMINOPHEN 10 MG/ML IV SOLN
1000.0000 mg | Freq: Four times a day (QID) | INTRAVENOUS | Status: AC
  Administered 2012-10-08 – 2012-10-09 (×4): 1000 mg via INTRAVENOUS
  Filled 2012-10-08 (×4): qty 100

## 2012-10-08 MED ORDER — VECURONIUM BROMIDE 10 MG IV SOLR
INTRAVENOUS | Status: DC | PRN
  Administered 2012-10-08 (×3): 2 mg via INTRAVENOUS

## 2012-10-08 MED ORDER — LACTATED RINGERS IV SOLN
INTRAVENOUS | Status: DC | PRN
  Administered 2012-10-08 (×3): via INTRAVENOUS

## 2012-10-08 MED ORDER — HYDROMORPHONE HCL PF 1 MG/ML IJ SOLN
INTRAMUSCULAR | Status: AC
  Filled 2012-10-08: qty 1

## 2012-10-08 MED ORDER — OXYCODONE-ACETAMINOPHEN 5-325 MG PO TABS
ORAL_TABLET | ORAL | Status: AC
  Filled 2012-10-08: qty 2

## 2012-10-08 MED ORDER — ACETAMINOPHEN 650 MG RE SUPP: 650.0000 mg | RECTAL | Status: DC | PRN

## 2012-10-08 MED ORDER — ONDANSETRON HCL 4 MG/2ML IJ SOLN: 4.0000 mg | INTRAMUSCULAR | Status: DC | PRN

## 2012-10-08 MED ORDER — DEXAMETHASONE SODIUM PHOSPHATE 10 MG/ML IJ SOLN: 10.0000 mg | INTRAMUSCULAR | Status: DC

## 2012-10-08 MED ORDER — ONDANSETRON HCL 4 MG/2ML IJ SOLN
INTRAMUSCULAR | Status: DC | PRN
  Administered 2012-10-08: 4 mg via INTRAVENOUS

## 2012-10-08 MED ORDER — SODIUM CHLORIDE 0.9 % IV SOLN: 250.0000 mL | INTRAVENOUS | Status: DC

## 2012-10-08 MED ORDER — ACETAMINOPHEN 325 MG PO TABS: 650.0000 mg | ORAL_TABLET | ORAL | Status: DC | PRN

## 2012-10-08 MED ORDER — ALUM & MAG HYDROXIDE-SIMETH 200-200-20 MG/5ML PO SUSP: 30.0000 mL | Freq: Four times a day (QID) | ORAL | Status: DC | PRN

## 2012-10-08 MED ORDER — DOCUSATE SODIUM 100 MG PO CAPS
100.0000 mg | ORAL_CAPSULE | Freq: Two times a day (BID) | ORAL | Status: DC
  Administered 2012-10-08 – 2012-10-11 (×6): 100 mg via ORAL
  Filled 2012-10-08 (×5): qty 1

## 2012-10-08 MED ORDER — ALBUTEROL SULFATE HFA 108 (90 BASE) MCG/ACT IN AERS: 2.0000 | INHALATION_SPRAY | RESPIRATORY_TRACT | Status: DC | PRN

## 2012-10-08 MED ORDER — GLYCOPYRROLATE 0.2 MG/ML IJ SOLN
INTRAMUSCULAR | Status: DC | PRN
  Administered 2012-10-08: 0.4 mg via INTRAVENOUS

## 2012-10-08 MED ORDER — ACETAMINOPHEN 325 MG PO TABS
650.0000 mg | ORAL_TABLET | ORAL | Status: DC | PRN
  Administered 2012-10-11: 650 mg via ORAL
  Filled 2012-10-08: qty 2

## 2012-10-08 MED ORDER — SODIUM CHLORIDE 0.9 % IR SOLN
Status: DC | PRN
  Administered 2012-10-08: 1

## 2012-10-08 MED ORDER — SODIUM CHLORIDE 0.9 % IR SOLN
Status: DC | PRN
  Administered 2012-10-08: 07:00:00

## 2012-10-08 MED ORDER — SODIUM CHLORIDE 0.9 % IV SOLN
INTRAVENOUS | Status: AC
  Filled 2012-10-08: qty 500

## 2012-10-08 MED ORDER — OXYCODONE-ACETAMINOPHEN 7.5-325 MG PO TABS: 1.0000 | ORAL_TABLET | ORAL | Status: DC | PRN

## 2012-10-08 MED ORDER — TRAZODONE HCL 150 MG PO TABS
300.0000 mg | ORAL_TABLET | Freq: Every day | ORAL | Status: DC
  Administered 2012-10-08 – 2012-10-10 (×3): 300 mg via ORAL
  Filled 2012-10-08 (×4): qty 2

## 2012-10-08 MED ORDER — TIZANIDINE HCL 2 MG PO TABS
2.0000 mg | ORAL_TABLET | Freq: Three times a day (TID) | ORAL | Status: DC | PRN
  Filled 2012-10-08: qty 1

## 2012-10-08 MED ORDER — ROCURONIUM BROMIDE 100 MG/10ML IV SOLN
INTRAVENOUS | Status: DC | PRN
  Administered 2012-10-08: 10 mg via INTRAVENOUS
  Administered 2012-10-08: 40 mg via INTRAVENOUS

## 2012-10-08 MED ORDER — OXYCODONE-ACETAMINOPHEN 5-325 MG PO TABS
2.0000 | ORAL_TABLET | ORAL | Status: DC | PRN
  Administered 2012-10-08: 2 via ORAL
  Filled 2012-10-08: qty 2

## 2012-10-08 MED ORDER — MIDAZOLAM HCL 5 MG/5ML IJ SOLN
INTRAMUSCULAR | Status: DC | PRN
  Administered 2012-10-08: 2 mg via INTRAVENOUS

## 2012-10-08 MED ORDER — RAMIPRIL 5 MG PO CAPS
5.0000 mg | ORAL_CAPSULE | Freq: Every day | ORAL | Status: DC
  Administered 2012-10-09 – 2012-10-11 (×3): 5 mg via ORAL
  Filled 2012-10-08 (×4): qty 1

## 2012-10-08 MED ORDER — LIDOCAINE-EPINEPHRINE 1 %-1:100000 IJ SOLN
INTRAMUSCULAR | Status: DC | PRN
  Administered 2012-10-08: 10 mL

## 2012-10-08 MED ORDER — FENTANYL CITRATE 0.05 MG/ML IJ SOLN
INTRAMUSCULAR | Status: DC | PRN
  Administered 2012-10-08: 100 ug via INTRAVENOUS
  Administered 2012-10-08: 50 ug via INTRAVENOUS

## 2012-10-08 MED ORDER — ARTIFICIAL TEARS OP OINT
TOPICAL_OINTMENT | OPHTHALMIC | Status: DC | PRN
  Administered 2012-10-08: 1 via OPHTHALMIC

## 2012-10-08 SURGICAL SUPPLY — 75 items
60mmrod straight nuvasive ×1 IMPLANT
ADH SKN CLS APL DERMABOND .7 (GAUZE/BANDAGES/DRESSINGS) ×1
APL SKNCLS STERI-STRIP NONHPOA (GAUZE/BANDAGES/DRESSINGS) ×1
BAG DECANTER FOR FLEXI CONT (MISCELLANEOUS) ×2 IMPLANT
BENZOIN TINCTURE PRP APPL 2/3 (GAUZE/BANDAGES/DRESSINGS) ×2 IMPLANT
BLADE SURG 11 STRL SS (BLADE) ×2 IMPLANT
BLADE SURG ROTATE 9660 (MISCELLANEOUS) IMPLANT
BRUSH SCRUB EZ PLAIN DRY (MISCELLANEOUS) ×2 IMPLANT
BUR MATCHSTICK NEURO 3.0 LAGG (BURR) ×2 IMPLANT
BUR PRECISION FLUTE 6.0 (BURR) ×2 IMPLANT
CANISTER SUCTION 2500CC (MISCELLANEOUS) ×2 IMPLANT
CLOTH BEACON ORANGE TIMEOUT ST (SAFETY) ×2 IMPLANT
CONT SPEC 4OZ CLIKSEAL STRL BL (MISCELLANEOUS) ×4 IMPLANT
COVER BACK TABLE 24X17X13 BIG (DRAPES) IMPLANT
COVER TABLE BACK 60X90 (DRAPES) ×2 IMPLANT
DECANTER SPIKE VIAL GLASS SM (MISCELLANEOUS) ×2 IMPLANT
DERMABOND ADVANCED (GAUZE/BANDAGES/DRESSINGS) ×1
DERMABOND ADVANCED .7 DNX12 (GAUZE/BANDAGES/DRESSINGS) ×1 IMPLANT
DRAPE C-ARM 42X72 X-RAY (DRAPES) ×4 IMPLANT
DRAPE LAPAROTOMY 100X72X124 (DRAPES) ×2 IMPLANT
DRAPE POUCH INSTRU U-SHP 10X18 (DRAPES) ×2 IMPLANT
DRAPE PROXIMA HALF (DRAPES) IMPLANT
DRAPE SURG 17X23 STRL (DRAPES) ×2 IMPLANT
DRSG OPSITE 4X5.5 SM (GAUZE/BANDAGES/DRESSINGS) ×3 IMPLANT
ELECT REM PT RETURN 9FT ADLT (ELECTROSURGICAL) ×2
ELECTRODE REM PT RTRN 9FT ADLT (ELECTROSURGICAL) ×1 IMPLANT
EVACUATOR 3/16  PVC DRAIN (DRAIN) ×1
EVACUATOR 3/16 PVC DRAIN (DRAIN) ×1 IMPLANT
GAUZE SPONGE 4X4 16PLY XRAY LF (GAUZE/BANDAGES/DRESSINGS) IMPLANT
GLOVE BIO SURGEON STRL SZ8 (GLOVE) ×4 IMPLANT
GLOVE BIOGEL PI IND STRL 8 (GLOVE) IMPLANT
GLOVE BIOGEL PI IND STRL 8.5 (GLOVE) IMPLANT
GLOVE BIOGEL PI INDICATOR 8 (GLOVE) ×2
GLOVE BIOGEL PI INDICATOR 8.5 (GLOVE) ×3
GLOVE ECLIPSE 7.5 STRL STRAW (GLOVE) IMPLANT
GLOVE ECLIPSE 8.0 STRL XLNG CF (GLOVE) ×1 IMPLANT
GLOVE EXAM NITRILE LRG STRL (GLOVE) IMPLANT
GLOVE EXAM NITRILE MD LF STRL (GLOVE) ×2 IMPLANT
GLOVE EXAM NITRILE XL STR (GLOVE) IMPLANT
GLOVE EXAM NITRILE XS STR PU (GLOVE) IMPLANT
GLOVE INDICATOR 8.5 STRL (GLOVE) ×4 IMPLANT
GOWN BRE IMP SLV AUR LG STRL (GOWN DISPOSABLE) ×1 IMPLANT
GOWN BRE IMP SLV AUR XL STRL (GOWN DISPOSABLE) ×4 IMPLANT
GOWN STRL REIN 2XL LVL4 (GOWN DISPOSABLE) ×1 IMPLANT
KIT BASIN OR (CUSTOM PROCEDURE TRAY) ×2 IMPLANT
KIT INFUSE SMALL (Orthopedic Implant) ×1 IMPLANT
KIT ROOM TURNOVER OR (KITS) ×2 IMPLANT
MILL MEDIUM DISP (BLADE) ×1 IMPLANT
NDL HYPO 25X1 1.5 SAFETY (NEEDLE) ×1 IMPLANT
NEEDLE HYPO 25X1 1.5 SAFETY (NEEDLE) ×2 IMPLANT
NS IRRIG 1000ML POUR BTL (IV SOLUTION) ×2 IMPLANT
PACK LAMINECTOMY NEURO (CUSTOM PROCEDURE TRAY) ×2 IMPLANT
PAD ARMBOARD 7.5X6 YLW CONV (MISCELLANEOUS) ×6 IMPLANT
PUTTY BONE DBX 5CC MIX (Putty) ×1 IMPLANT
ROD PREBENT 60MM (Rod) ×1 IMPLANT
SCREW LOCK (Screw) ×8 IMPLANT
SCREW LOCK 100X5.5X OPN (Screw) IMPLANT
SCREW LOCK 5.5MM ROD (Screw) ×2 IMPLANT
SCREW POLY 45X6.5 (Screw) IMPLANT
SCREW POLY 6.5X35 (Screw) ×2 IMPLANT
SCREW POLY 6.5X45MM (Screw) ×4 IMPLANT
SPACER CALIBER 10X22 9-13MM-12 (Spacer) ×2 IMPLANT
SPONGE GAUZE 4X4 12PLY (GAUZE/BANDAGES/DRESSINGS) ×2 IMPLANT
SPONGE LAP 4X18 X RAY DECT (DISPOSABLE) IMPLANT
SPONGE SURGIFOAM ABS GEL 100 (HEMOSTASIS) ×2 IMPLANT
STRIP CLOSURE SKIN 1/2X4 (GAUZE/BANDAGES/DRESSINGS) ×3 IMPLANT
SUT VIC AB 0 CT1 18XCR BRD8 (SUTURE) ×2 IMPLANT
SUT VIC AB 0 CT1 8-18 (SUTURE) ×4
SUT VIC AB 2-0 CT1 18 (SUTURE) ×2 IMPLANT
SUT VICRYL 4-0 PS2 18IN ABS (SUTURE) ×2 IMPLANT
SYR 20ML ECCENTRIC (SYRINGE) ×2 IMPLANT
TOWEL OR 17X24 6PK STRL BLUE (TOWEL DISPOSABLE) ×2 IMPLANT
TOWEL OR 17X26 10 PK STRL BLUE (TOWEL DISPOSABLE) ×2 IMPLANT
TRAY FOLEY CATH 14FRSI W/METER (CATHETERS) ×2 IMPLANT
WATER STERILE IRR 1000ML POUR (IV SOLUTION) ×2 IMPLANT

## 2012-10-08 NOTE — Progress Notes (Signed)
UR COMPLETED  

## 2012-10-08 NOTE — Preoperative (Signed)
Beta Blockers   Reason not to administer Beta Blockers:Not Applicable 

## 2012-10-08 NOTE — Anesthesia Preprocedure Evaluation (Signed)
Anesthesia Evaluation  Patient identified by MRN, date of birth, ID band Patient awake    Reviewed: Allergy & Precautions, H&P , NPO status , Patient's Chart, lab work & pertinent test results  Airway Mallampati: II      Dental  (+) Teeth Intact and Dental Advisory Given   Pulmonary  breath sounds clear to auscultation        Cardiovascular Rhythm:Regular Rate:Normal     Neuro/Psych    GI/Hepatic   Endo/Other    Renal/GU      Musculoskeletal   Abdominal   Peds  Hematology   Anesthesia Other Findings   Reproductive/Obstetrics                           Anesthesia Physical Anesthesia Plan  ASA: III  Anesthesia Plan: General   Post-op Pain Management:    Induction: Intravenous  Airway Management Planned: Oral ETT  Additional Equipment:   Intra-op Plan:   Post-operative Plan: Extubation in OR  Informed Consent: I have reviewed the patients History and Physical, chart, labs and discussed the procedure including the risks, benefits and alternatives for the proposed anesthesia with the patient or authorized representative who has indicated his/her understanding and acceptance.   Dental advisory given  Plan Discussed with: CRNA and Surgeon  Anesthesia Plan Comments: (Lumbar spondylosis with pseudoarthrosis Type 2 DM glucose 110 Htn  Plan GA with oral ETT  Kipp Brood, MD)        Anesthesia Quick Evaluation

## 2012-10-08 NOTE — Transfer of Care (Signed)
Immediate Anesthesia Transfer of Care Note  Patient: Chelsea Benson  Procedure(s) Performed: Procedure(s) (LRB) with comments: POSTERIOR LUMBAR FUSION 1 LEVEL (N/A) - LUMBAR FIVE SACRAL ONE POSTERIOR LUMBAR INTERBODY FUSION EXPLORATION  OF FUSION LUMBAR FOUR-FIVE POSSIBLE REDO  Patient Location: PACU  Anesthesia Type:General  Level of Consciousness: awake, alert , oriented and patient cooperative  Airway & Oxygen Therapy: Patient Spontanous Breathing and Patient connected to nasal cannula oxygen  Post-op Assessment: Report given to PACU RN, Post -op Vital signs reviewed and stable and Patient moving all extremities  Post vital signs: Reviewed and stable  Complications: No apparent anesthesia complications

## 2012-10-08 NOTE — H&P (Signed)
Chelsea Benson is an 61 y.o. female.   Chief Complaint: Back and left leg pain HPI: Patient is a very pleasant 61 year old female who previously undergone an L4-5 fusion with anterolateral cage implant with percutaneous screws patient initially did fairly well however over the last several weeks and months is a progress worsening back and left greater right leg pain sequential imaging has revealed progressive facet arthropathy and foraminal stenosis at the disc space below her fusion L5-S1 with progression of her degenerative disease at that level. In addition had also show questionable incomplete incorporation around the implant at L4-5. Patient went to physical therapy steroid injections anti-inflammatories and escalating doses of narcotics without any significant benefit. And due to patient's failure conservative treatment imaging findings and [facility] she was recommended posterior lumbar interbody fusion L4-5 with exploration of fusion removal of hardware L4-5. Axes reviewed the risks benefits of the operation as well as perioperative course and expectations of outcome alternatives of surgery she understands and agrees to proceed forward.  Past Medical History  Diagnosis Date  . H/O echocardiogram     states she had echo in [facility]. a couple of yrs. ago  . Hypertension   . Anxiety   . Mental disorder   . Asthma   . Pneumonia     seen in ED at [facility]- for pneumonia, Jan./ 2013- treated & sent home   . Diabetes mellitus without complication   . Chronic kidney disease     renal calculi  . Headache     sinus related   . Arthritis     DDD, spondylosis    Past Surgical History  Procedure Date  . Nasal sinus surgery   . Breast surgery     L cyst removed - 1972  . Carpal tunnel release     both hands   . Trigger finger release     L thumb  . Calf -r- cyst removed   . Shoulder arthroscopy     R shoulder- RCR  . Back surgery     2012- lumbar fusion  .  Great toe     removed arthritis   . Bunionectomy     L foot  . Abdominal hysterectomy     History reviewed. No pertinent family history. Social History:  reports that she has never smoked. She does not have any smokeless tobacco history on file. She reports that she drinks alcohol. She reports that she does not use illicit drugs.  Allergies:  Allergies  Allergen Reactions  . Crestor (Rosuvastatin Calcium) Other (See Comments)    Makes her heart beat really fast.  . Almond Meal     Tongue itches  . Cozaar Cough    Medications Prior to Admission  Medication Sig Dispense Refill  . albuterol (PROVENTIL HFA;VENTOLIN HFA) 108 (90 BASE) MCG/ACT inhaler Inhale 2 puffs into the lungs every 4 (four) hours as needed. For wheezing      . beclomethasone (QVAR) 80 MCG/ACT inhaler Inhale 1 puff into the lungs 2 (two) times daily.      Marland Kitchen diltiazem (TIAZAC) 360 MG 24 hr capsule Take 360 mg by mouth daily before breakfast.       . FLUoxetine (PROZAC) 20 MG capsule Take 20 mg by mouth daily before breakfast.       . hydrOXYzine (ATARAX/VISTARIL) 25 MG tablet Take 25-100 mg by mouth 3 (three) times daily as needed. Takes 1 tablet 3 times daily as needed and 4 tablets at bedtime as  needed for sleep      . metFORMIN (GLUCOPHAGE) 1000 MG tablet Take 1,000 mg by mouth 2 (two) times daily with a meal.      . mirtazapine (REMERON) 30 MG tablet Take 30 mg by mouth at bedtime.      . montelukast (SINGULAIR) 10 MG tablet Take 10 mg by mouth at bedtime.       Marland Kitchen OVER THE COUNTER MEDICATION Take 1 tablet by mouth daily as needed. For allergies. Takes [employer] [facility]      . oxyCODONE-acetaminophen (PERCOCET) 7.5-325 MG per tablet Take 1 tablet by mouth every 4 (four) hours as needed. For pain      . ramipril (ALTACE) 5 MG capsule Take 5 mg by mouth daily before breakfast.       . simvastatin (ZOCOR) 40 MG tablet Take 40 mg by mouth daily with breakfast.       . tiZANidine (ZANAFLEX) 2 MG tablet Take 2 mg by mouth  every 8 (eight) hours as needed. For spasms      . traZODone (DESYREL) 150 MG tablet Take 300 mg by mouth at bedtime as needed. For sleep      . dicyclomine (BENTYL) 20 MG tablet Take 10-20 mg by mouth every 6 (six) hours as needed. For abdominal cramping      . EPINEPHrine (EPIPEN) 0.3 mg/0.3 mL DEVI Inject 0.3 mg into the muscle once as needed. For bee stings        No results found for this or any previous visit (from the past 48 hour(s)). No results found.  Review of Systems  Constitutional: Negative.   HENT: Negative.   Eyes: Negative.   Respiratory: Negative.   Cardiovascular: Negative.   Gastrointestinal: Negative.   Genitourinary: Negative.   Musculoskeletal: Positive for myalgias, back pain and joint pain.  Skin: Negative.   Neurological: Positive for tingling.  Endo/Heme/Allergies: Negative.   Psychiatric/Behavioral: Negative.     Blood pressure 137/91, pulse 66, temperature 98.4 F (36.9 C), temperature source Oral, resp. rate 20, SpO2 100.00%. Physical Exam  Constitutional: She is oriented to person, place, and time. She appears well-developed and well-nourished.  HENT:  Head: Normocephalic.  Eyes: Conjunctivae normal are normal. Pupils are equal, round, and reactive to light.  Neck: Normal range of motion.  Cardiovascular: Normal rate.   Respiratory: Effort normal.  GI: Soft.  Musculoskeletal: Normal range of motion.  Neurological: She is alert and oriented to person, place, and time. She has normal strength. GCS eye subscore is 4. GCS verbal subscore is 5. GCS motor subscore is 6.  Reflex Scores:      Patellar reflexes are 0 on the right side and 0 on the left side.      Achilles reflexes are 0 on the right side and 0 on the left side.      Strength is 5 out of 5 in her iliopsoas, quads, hip she's, gastrocs, anterior tibialis, and EHL.     Assessment/Plan 61 year female presents for an L5-S1 posterior lumbar interbody fusion exploration with possible redo L4-5  fusion.  Chelse Matas P 10/08/2012, 7:22 AM

## 2012-10-08 NOTE — Op Note (Signed)
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 degenerative disc disease L5-S1  Postoperative diagnosis: Same  Procedure: #1 exploration of fusion removal of hardware L4-5  #2 decompressive lumbar laminectomy and excess will be needed with a standard interbody fusion L5-S1  #3 posterior lumbar interbody fusion L5-S1 using a caliber expandable peek cages packed with local autograft mixed with DBX and BMP  #4 redo posterior lateral fusion L4-5 using local are graft mixed DBX and BMP  #5 pedicle screw fixation L4-S1 using the nuvasive 5.5 pedicle screw system marrying up to the percutaneous system  #6 placement of a large Hemovac drain  Surgeon: Jillyn Hidden Jasiah Buntin  Assistant: Barnett Abu  Anesthesia: Gen.  EBL: Minimal less than 500  History of present illness: Patient is a 61 [3 the standard on the anterolateral fusion with percutaneous screws at L4-5 patient should do well however start progressive worsening back pain and left greater right leg pain rating down L5 nerve root pattern. Subsequent imaging showed a possible pseudoarthrosis but also progression of facet arthropathy degenerative disease and spinal stenosis at L5-S1 patient failed all forms of conservative treatment with anti-inflammatories physical therapy and steroid injections and due to the progression of clinical syndrome and imaging findings patient recommended L5-S1 posterior lumbar interbody fusion with exploration of fusion and redo posterior lateral fusion L4-5. I extensively reviewed the risks and benefits of the operation as well as perioperative course expectations about alternatives of surgery she understands and agrees to proceed forward.  Operative procedure: Patient brought into the or was induced under general anesthesia positioned prone the Wilson frame and back was prepped and draped in routine sterile fashion mid line incision was made and then around L4-S1 after infiltration of 10 cc lidocaine  with epi and subperiosteal dissections care on the lamina of L4 and L5 exposing TPS L4-5 and S1 on the right and exposing the hardware at L4-5 on the left this are was then disconnected the fusion was inspected and it was felt to be not completely solid or adequate was still some motion around the L4-5 segment. So central decompression was begun at L5-S1 with complete removal of spinous process and complete medial facetectomies there was marked overgrowth of the facet complex at L5-S1 with dense overgrowth into the L5 foramen radical L5 foraminotomies were carried out skeletonizing the nerve root FORAMEN. As allowed lateral axis the disc space as well. The S1 nerve was also identified and skeletonized flush with the S1 pedicle. Her adequate decompression achieved this taken the interbody work the space was incised the patient's right side and a size 10 distractor was inserted this was felt to have good apposition the endplates and this was cleaned out and the left and endplates were prepared a 9 mm expandable lordotic peek cages packed with local autograft mixed DBX and inserted after adequate endplate preparation left-sided expanded up to 11 mm then the distractor was removed fluoroscopy U. C7 we confirmed good position of the implant then working on the right side the spaces further cleaned and less central disc was removed central endplates were prepared a small piece of BMP and local are graft was packed centrally and a 9 mm cages to the right side and expanded to a probable size. Thoroughly the right body work been done at L5-S1 cicatrix replacement is an AP fluoroscopy at L4 and the right a pilot hole was identified and then using lateral fluoroscopy these were cannulated with the awl probed O55 Probed again a 6 5 x 45  screw inserted L4 and the right in a similar fashion 6 x 45 screws inserted at L5 and 05/09/1933 at S1 on the left side patient has screws at L4-L5 and 11 elect to keep his CT scan showed  adequate purchase and no loose the signs of loosening then the S1 pedicle was cannulated probed tapped probed again and a 6 5 x 35 screw inserted S1 with excellent purchase. Then postop AP lateral fluoroscopy confirmed good position of screws rods and implants then the wound scope to irrigate fixing space was maintained aggressive decortication was care MTPs or lateral gutters BMP local autograft mixed DBX and packed posterior laterally then to 6 the rods one lordotic of the right once to the left were then placed all screws were anchored down tot tightened down large ureter was placed the foraminal reinspected confirm patency no migration of graft material and Gelfoam was laid up the dura large ureter was placed and was closed in layers with after Vicryl and a running 4 septic or and skin benzoin and Steri-Strips were applied patient covered in stable condition. At the end of case on it counts were scheduled correct.

## 2012-10-08 NOTE — Anesthesia Postprocedure Evaluation (Signed)
  Anesthesia Post-op Note  Patient: Chelsea Benson  Procedure(s) Performed: Procedure(s) (LRB) with comments: POSTERIOR LUMBAR FUSION 1 LEVEL (N/A) - LUMBAR FIVE SACRAL ONE POSTERIOR LUMBAR INTERBODY FUSION EXPLORATION  OF FUSION LUMBAR FOUR-FIVE POSSIBLE REDO  Patient Location: PACU  Anesthesia Type:General  Level of Consciousness: awake, alert  and oriented  Airway and Oxygen Therapy: Patient Spontanous Breathing and Patient connected to nasal cannula oxygen  Post-op Pain: none  Post-op Assessment: Post-op Vital signs reviewed and Patient's Cardiovascular Status Stable  Post-op Vital Signs: stable  Complications: No apparent anesthesia complications

## 2012-10-08 NOTE — Anesthesia Procedure Notes (Signed)
Procedure Name: Intubation Date/Time: 10/08/2012 7:44 AM Performed by: Jerilee Hoh Pre-anesthesia Checklist: Patient identified, Emergency Drugs available, Suction available and Patient being monitored Patient Re-evaluated:Patient Re-evaluated prior to inductionOxygen Delivery Method: Circle system utilized Preoxygenation: Pre-oxygenation with 100% oxygen Intubation Type: IV induction Ventilation: Mask ventilation without difficulty and Oral airway inserted - appropriate to patient size Laryngoscope Size: Mac and 3 Grade View: Grade II Tube type: Oral Tube size: 7.5 mm Number of attempts: 1 Airway Equipment and Method: Stylet Placement Confirmation: ETT inserted through vocal cords under direct vision,  positive ETCO2 and breath sounds checked- equal and bilateral Secured at: 21 cm Tube secured with: Tape Dental Injury: Teeth and Oropharynx as per pre-operative assessment

## 2012-10-09 LAB — GLUCOSE, CAPILLARY
Glucose-Capillary: 120 mg/dL — ABNORMAL HIGH (ref 70–99)
Glucose-Capillary: 112 mg/dL — ABNORMAL HIGH (ref 70–99)

## 2012-10-09 MED ORDER — HYDROMORPHONE HCL 2 MG PO TABS
4.0000 mg | ORAL_TABLET | ORAL | Status: DC | PRN
  Administered 2012-10-09 – 2012-10-11 (×11): 4 mg via ORAL
  Filled 2012-10-09 (×11): qty 2

## 2012-10-09 MED ORDER — HYDROXYZINE HCL 25 MG PO TABS
75.0000 mg | ORAL_TABLET | Freq: Two times a day (BID) | ORAL | Status: DC | PRN
  Administered 2012-10-09: 50 mg via ORAL

## 2012-10-09 NOTE — Progress Notes (Signed)
Subjective: Patient reports Doing very well no leg pain in the living and voiding spontaneously  Objective: Vital signs in last 24 hours: Temp:  [97.2 F (36.2 C)-98.2 F (36.8 C)] 97.9 F (36.6 C) (11/05 0600) Pulse Rate:  [67-86] 75  (11/05 0600) Resp:  [14-18] 18  (11/05 0600) BP: (100-128)/(68-87) 117/81 mmHg (11/05 0600) SpO2:  [95 %-100 %] 99 % (11/05 0750) Weight:  [76.5 kg (168 lb 10.4 oz)] 76.5 kg (168 lb 10.4 oz) (11/05 0750)  Intake/Output from previous day: 11/04 0701 - 11/05 0700 In: 2780 [P.O.:580; I.V.:2200] Out: 3685 [Urine:3125; Drains:210; Blood:350] Intake/Output this shift:    Strength out of 5 wound dry  Lab Results: No results found for this basename: WBC:2,HGB:2,HCT:2,PLT:2 in the last 72 hours BMET No results found for this basename: NA:2,K:2,CL:2,CO2:2,GLUCOSE:2,BUN:2,CREATININE:2,CALCIUM:2 in the last 72 hours  Studies/Results: Dg Lumbar Spine 2-3 Views  10/08/2012  *RADIOLOGY REPORT*  Clinical Data: Lumbar fusion.  DG C-ARM 1-60 MIN,LUMBAR SPINE - 2-3 VIEW  Technique: Intraoperative spot images  Comparison:  07/31/2012  Findings: Bilateral pedicle screws are  present at L4, L5, and S1. Disc spacers in place at the L4-5 and L5 S1 discs.  Hardware and L4 and L5 is probably unchanged.  Hardware in the L5 S1 disc and S1 pedicles are new.  No breakage or loosening of the hardware.  No vertebral compression deformity.  Anatomic alignment.  IMPRESSION: L4-S1 fusion.   Original Report Authenticated By: Jolaine Click, M.D.    Dg C-arm 1-60 Min  10/08/2012  *RADIOLOGY REPORT*  Clinical Data: Lumbar fusion.  DG C-ARM 1-60 MIN,LUMBAR SPINE - 2-3 VIEW  Technique: Intraoperative spot images  Comparison:  07/31/2012  Findings: Bilateral pedicle screws are  present at L4, L5, and S1. Disc spacers in place at the L4-5 and L5 S1 discs.  Hardware and L4 and L5 is probably unchanged.  Hardware in the L5 S1 disc and S1 pedicles are new.  No breakage or loosening of the hardware.   No vertebral compression deformity.  Anatomic alignment.  IMPRESSION: L4-S1 fusion.   Original Report Authenticated By: Jolaine Click, M.D.     Assessment/Plan: Progressive mobilization with physical therapy possible discharge tomorrow  LOS: 1 day     Chelsea Benson P 10/09/2012, 9:36 AM

## 2012-10-09 NOTE — Evaluation (Signed)
Physical Therapy Evaluation Patient Details Name: Chelsea Benson MRN: [id number] DOB: [birth year] Today's Date: 10/09/2012 Time: 4098-1191 PT Time Calculation (min): 34 min  PT Assessment / Plan / Recommendation Clinical Impression  Pt s/p posterior lumbar fusion x 1 level presenting with expected pain at surgical site requiring minimal assist with mobility. Pt tolerated first PT session well and anticipate patient to be safe to return home with 24/7 supervision/assist and home PT with recommended DME. Patient well educated from recent previous back surgery.    PT Assessment  Patient needs continued PT services    Follow Up Recommendations  Home health PT;Supervision/Assistance - 24 hour    Does the patient have the potential to tolerate intense rehabilitation      Barriers to Discharge None      Equipment Recommendations  3 in 1 bedside comode    Recommendations for Other Services     Frequency Min 5X/week    Precautions / Restrictions Precautions Precautions: Back Precaution Booklet Issued: Yes (comment) Precaution Comments: pt with previous back surgery and able to recall all 3/3 precautions Required Braces or Orthoses: Spinal Brace Spinal Brace: Lumbar corset;Applied in sitting position Restrictions Weight Bearing Restrictions: No   Pain: 6/10 at surgical site, 7/10 post PT      Mobility  Bed Mobility Bed Mobility: Right Sidelying to Sit;Sitting - Scoot to Edge of Bed Right Sidelying to Sit: 4: Min assist;With rails;HOB flat Sitting - Scoot to [place] of Bed: 4: Min assist;With rail Details for Bed Mobility Assistance: increased time due to pain Transfers Transfers: Sit to Stand;Stand to Sit Sit to Stand: 4: Min assist;With upper extremity assist;From bed Stand to Sit: 4: Min guard;To chair/3-in-1;To toilet (v/c's for hand placement) Details for Transfer Assistance: v/c's for hand placement, increased time Ambulation/Gait Ambulation/Gait Assistance: 4: Min  guard Ambulation Distance (Feet): 150 Feet Assistive device: Rolling walker Ambulation/Gait Assistance Details: good technique, upright posture Gait Pattern: Step-through pattern;Decreased stride length Stairs: No    Shoulder Instructions     Exercises     PT Diagnosis: Difficulty walking  PT Problem List: Decreased strength;Decreased activity tolerance PT Treatment Interventions: DME instruction;Gait training;Stair training;Therapeutic exercise   PT Goals Acute Rehab PT Goals PT Goal Formulation: With patient Time For Goal Achievement: 10/16/12 Potential to Achieve Goals: Good Pt will Roll Supine to Right Side: with modified independence PT Goal: Rolling Supine to Right Side - Progress: Goal set today Pt will go Supine/Side to Sit: with modified independence;with HOB 0 degrees PT Goal: Supine/Side to Sit - Progress: Goal set today Pt will go Sit to Stand: with modified independence;with upper extremity assist (up to RW) PT Goal: Sit to Stand - Progress: Goal set today Pt will Ambulate: >150 feet;with modified independence;with rolling walker PT Goal: Ambulate - Progress: Goal set today Pt will Go Up / Down Stairs: 1-2 stairs;with min assist;with least restrictive assistive device PT Goal: Up/Down Stairs - Progress: Goal set today Additional Goals Additional Goal #1: Pt able to recall 3/3 back precautions and adhere to them 100% of time. PT Goal: Additional Goal #1 - Progress: Goal set today  Visit Information  Last PT Received On: 10/09/12    Subjective Data  Subjective: Pt received in R sidelying in bed agreeable to PT. Patient Stated Goal: home   Prior Functioning  Home Living Lives With: Family Available Help at Discharge: Available 24 hours/day (until next Wednesday then alone during the day) Type of Home: House Home Access: Stairs to enter [employer] of Steps:  2 Entrance Stairs-Rails: None Home Layout: One level Bathroom Shower/Tub: Teacher, music: Standard Bathroom Accessibility: Yes How Accessible: Accessible via walker Home Adaptive Equipment: Walker - rolling Prior Function Level of Independence: Independent Able to Take Stairs?: Yes Driving: Yes Vocation: On disability Communication Communication: No difficulties Dominant Hand: Right    Cognition  Overall Cognitive Status: Appears within functional limits for tasks assessed/performed Arousal/Alertness: Awake/alert Orientation Level: Appears intact for tasks assessed Behavior During Session: [facility] for tasks performed    Extremity/Trunk Assessment Right Upper Extremity Assessment RUE ROM/Strength/Tone: Within functional levels Left Upper Extremity Assessment LUE ROM/Strength/Tone: Within functional levels Right Lower Extremity Assessment RLE ROM/Strength/Tone: Within functional levels Left Lower Extremity Assessment LLE ROM/Strength/Tone: Within functional levels Trunk Assessment Trunk Assessment:  (surgical incision)   Balance    End of Session PT - End of Session Equipment Utilized During Treatment: Gait belt;Back brace Activity Tolerance: Patient tolerated treatment well Patient left: in chair;with call bell/phone within reach;with nursing in room Nurse Communication: Mobility status  GP     Marcene Brawn 10/09/2012, 9:30 AM  Lewis Shock, PT, DPT Pager #: [phone] Office #: [phone]

## 2012-10-09 NOTE — Evaluation (Signed)
Occupational Therapy Evaluation Patient Details Name: Chelsea Benson MRN: [id number] DOB: 03/02/1951 Today's Date: 10/09/2012 Time: 1191-4782 OT Time Calculation (min): 15 min  OT Assessment / Plan / Recommendation Clinical Impression  Pt is recovering from PLIF.  This is pt's second back surgery so she is aware of her precautions, but does not consistently generlize them.  Will follow acutely to address the below areas of deficit.  Do not anticipate pt will need further OT upon d/c home.    OT Assessment  Patient needs continued OT Services    Follow Up Recommendations  No OT follow up    Barriers to Discharge      Equipment Recommendations  3 in 1 bedside comode    Recommendations for Other Services    Frequency  Min 2X/week    Precautions / Restrictions Precautions Precautions: Sternal;Back Precaution Booklet Issued: Yes (comment) Precaution Comments: pt with previous back surgery and able to recall all 3/3 precautions Required Braces or Orthoses: Spinal Brace Spinal Brace: Lumbar corset;Applied in sitting position Restrictions Weight Bearing Restrictions: No   Pertinent Vitals/Pain 7/10 back, premedicated, repositioned    ADL  Eating/Feeding: Simulated;Independent Where Assessed - Eating/Feeding: Chair Grooming: Performed;Wash/dry hands;Supervision/safety Where Assessed - Grooming: Unsupported standing Upper Body Bathing: Simulated;Set up Where Assessed - Upper Body Bathing: Unsupported sitting Lower Body Bathing: Simulated;Moderate assistance Where Assessed - Lower Body Bathing: Supported sit to stand;Unsupported sitting Upper Body Dressing: Simulated;Set up Where Assessed - Upper Body Dressing: Unsupported sitting Lower Body Dressing: Performed;Moderate assistance Where Assessed - Lower Body Dressing: Supported sit to stand;Unsupported sitting Toilet Transfer: Performed;Min guard Statistician Method: Sit to Barista: Comfort height  toilet Toileting - Clothing Manipulation and Hygiene: Performed;Supervision/safety Where Assessed - Engineer, mining and Hygiene: Sit to stand from 3-in-1 or toilet Equipment Used: Gait belt;Back brace;Rolling walker Transfers/Ambulation Related to ADLs: min guard progressing to supervision for ambulation with RW ADL Comments: Pt was able to access feet by crossing foot over opposite knee, but not able to do s/p sx.  Will have family available to assist as needed until she is able to perform herself.    OT Diagnosis: Generalized weakness;Acute pain  OT Problem List: Impaired balance (sitting and/or standing);Decreased knowledge of use of DME or AE;Decreased knowledge of precautions;Pain OT Treatment Interventions: Self-care/ADL training;DME and/or AE instruction;Patient/family education   OT Goals Acute Rehab OT Goals OT Goal Formulation: With patient Time For Goal Achievement: 10/16/12 Potential to Achieve Goals: Good ADL Goals Pt Will Perform Tub/Shower Transfer: Tub transfer;Ambulation;with DME;Maintaining back safety precautions;Other (comment);with supervision (determine need for tub equipment) ADL Goal: Tub/Shower Transfer - Progress: Goal set today Miscellaneous OT Goals Miscellaneous OT Goal #1: Pt will generalize back precautions in mobility and ADL independently. OT Goal: Miscellaneous Goal #1 - Progress: Goal set today Miscellaneous OT Goal #2: Pt will be knowledgeable in use and availability of LB AE. OT Goal: Miscellaneous Goal #2 - Progress: Goal set today  Visit Information  Last OT Received On: 10/09/12 Assistance Needed: +1 PT/OT Co-Evaluation/Treatment: Yes    Subjective Data  Subjective: "I had a back surgery in March 2012." Patient Stated Goal: Home with assist of her family.   Prior Functioning     Home Living Lives With: Family Available Help at Discharge: Available 24 hours/day Type of Home: House Home Access: Stairs to enter [employer] of Steps: 2 Entrance Stairs-Rails: None Home Layout: One level Bathroom Shower/Tub: Engineer, manufacturing systems: Standard Bathroom Accessibility: Yes How Accessible: Accessible via  walker Home Adaptive Equipment: Walker - rolling Prior Function Level of Independence: Independent Able to Take Stairs?: Yes Driving: No Vocation: On disability Communication Communication: No difficulties Dominant Hand: Right         Vision/Perception     Cognition  Overall Cognitive Status: Appears within functional limits for tasks assessed/performed Arousal/Alertness: Awake/alert Orientation Level: Appears intact for tasks assessed Behavior During Session: [facility] for tasks performed    Extremity/Trunk Assessment Right Upper Extremity Assessment RUE ROM/Strength/Tone: [facility] for tasks assessed Left Upper Extremity Assessment LUE ROM/Strength/Tone: WFL for tasks assessed Right Lower Extremity Assessment RLE ROM/Strength/Tone: Within functional levels Left Lower Extremity Assessment LLE ROM/Strength/Tone: Within functional levels Trunk Assessment Trunk Assessment:  (surgical incision)     Mobility Bed Mobility Bed Mobility: Right Sidelying to Sit;Sitting - Scoot to Edge of Bed Right Sidelying to Sit: 4: Min assist;With rails;HOB flat Sitting - Scoot to [place] of Bed: 4: Min assist;With rail Details for Bed Mobility Assistance: increased time due to pain Transfers Sit to Stand: 4: Min assist;With upper extremity assist;From bed Stand to Sit: 4: Min guard;To chair/3-in-1;To toilet (v/c's for hand placement) Details for Transfer Assistance: v/c's for hand placement, increased time     Shoulder Instructions     Exercise     Balance     End of Session OT - End of Session Activity Tolerance: Patient tolerated treatment well Patient left: in chair;with call bell/phone within reach Nurse Communication: Mobility status;Other (comment) (pt urinated )  GO     Chelsea Benson 10/09/2012, 9:40 AM [phone]

## 2012-10-10 LAB — GLUCOSE, CAPILLARY
Glucose-Capillary: 149 mg/dL — ABNORMAL HIGH (ref 70–99)
Glucose-Capillary: 138 mg/dL — ABNORMAL HIGH (ref 70–99)

## 2012-10-10 MED FILL — Heparin Sodium (Porcine) Inj 1000 Unit/ML: INTRAMUSCULAR | Qty: 30 | Status: AC

## 2012-10-10 MED FILL — Sodium Chloride IV Soln 0.9%: INTRAVENOUS | Qty: 1000 | Status: AC

## 2012-10-10 MED FILL — Sodium Chloride Irrigation Soln 0.9%: Qty: 3000 | Status: AC

## 2012-10-10 NOTE — Progress Notes (Signed)
[facility]  Patient Status: New  [facility] is providing the following services: PT  If patient discharges after hours, please call [phone].   Kizzie Furnish 10/10/2012, 12:17 PM

## 2012-10-10 NOTE — Progress Notes (Signed)
Subjective: Patient reports That she's feeling well she's having no leg pain her back pain is getting well-controlled although the working on mobilization on the oral analgesics  Objective: Vital signs in last 24 hours: Temp:  [97.5 F (36.4 C)-98.2 F (36.8 C)] 97.7 F (36.5 C) (11/06 0600) Pulse Rate:  [63-86] 69  (11/06 0600) Resp:  [16-18] 16  (11/06 0600) BP: (107-126)/(61-86) 123/82 mmHg (11/06 0600) SpO2:  [93 %-99 %] 97 % (11/06 0744)  Intake/Output from previous day: 11/05 0701 - 11/06 0700 In: -  Out: 200 [Drains:200] Intake/Output this shift:    Strength is 5 out of 5 wound is clean dry and  Lab Results: No results found for this basename: WBC:2,HGB:2,HCT:2,PLT:2 in the last 72 hours BMET No results found for this basename: NA:2,K:2,CL:2,CO2:2,GLUCOSE:2,BUN:2,CREATININE:2,CALCIUM:2 in the last 72 hours  Studies/Results: No results found.  Assessment/Plan: Progressive mobilization today possible discharge tomorrow  LOS: 2 days     Chelsea Benson P 10/10/2012, 11:50 AM

## 2012-10-10 NOTE — Progress Notes (Signed)
Occupational Therapy Treatment and Discharge  Patient Details Name: Chelsea Benson MRN: [id number] DOB: 1951/05/18 Today's Date: 10/10/2012 Time: 8295-6213 OT Time Calculation (min): 25 min  OT Assessment / Plan / Recommendation Comments on Treatment Session Pt has met all goals and education completed.  Anticipate d/c home tomorrow.  Will sign off.    Follow Up Recommendations  No OT follow up    Barriers to Discharge       Equipment Recommendations  3 in 1 bedside comode;Tub/shower seat (shower chair with back)    Recommendations for Other Services    Frequency Min 2X/week   Plan All goals met and education completed, patient discharged from OT services;Discharge plan remains appropriate    Precautions / Restrictions Precautions Precautions: Back Precaution Comments: Pt able to independently recall 3/3 back precautions. Required Braces or Orthoses: Spinal Brace Spinal Brace: Lumbar corset;Applied in sitting position   Pertinent Vitals/Pain See vitals    ADL  Toilet Transfer: Buyer, retail Method: Sit to Barista: Other (comment) Nurse, children's) Tub/Shower Transfer: Performed;Supervision/safety Tub/Shower Transfer Method: Ambulating Equipment Used: Back brace;Rolling walker Transfers/Ambulation Related to ADLs: supervision with RW for safety.   ADL Comments: Pt ambulated from room to therapy gym with supervision and RW.  Pt able to safely perform tub transfer (side step into tub) with supervision for safety. Pt reports that she had a shower stool for her tub after her last surgery but no longer has it.  Pt states that if she is not able to get one covered by insurance, then she will be able to shop around for one.  Pt independently maintained back precautions throughout session.  Discussed LB bathing/dressing techniques with pt.  Anticipate that as pt becomes less sore, she will be able to cross her ankles over her knees as she  did prior to sx.  Will have family available to assist with ADLs also.      OT Diagnosis:    OT Problem List:   OT Treatment Interventions:     OT Goals Acute Rehab OT Goals OT Goal Formulation: With patient Time For Goal Achievement: 10/16/12 Potential to Achieve Goals: Good ADL Goals Pt Will Perform Tub/Shower Transfer: Tub transfer;Ambulation;with DME;Maintaining back safety precautions;Other (comment);with supervision ADL Goal: Tub/Shower Transfer - Progress: Met Miscellaneous OT Goals Miscellaneous OT Goal #1: Pt will generalize back precautions in mobility and ADL independently. OT Goal: Miscellaneous Goal #1 - Progress: Met Miscellaneous OT Goal #2: Pt will be knowledgeable in use and availability of LB AE. OT Goal: Miscellaneous Goal #2 - Progress: Met  Visit Information  Last OT Received On: 10/10/12 Assistance Needed: +1    Subjective Data      Prior Functioning       Cognition  Overall Cognitive Status: Appears within functional limits for tasks assessed/performed Arousal/Alertness: Awake/alert Orientation Level: Appears intact for tasks assessed Behavior During Session: [facility] for tasks performed    Mobility  Shoulder Instructions Bed Mobility Bed Mobility: Not assessed Transfers Transfers: Sit to Stand;Stand to Sit Sit to Stand: 5: Supervision;With upper extremity assist;With armrests;From chair/3-in-1 Stand to Sit: 5: Supervision;To chair/3-in-1;With armrests;With upper extremity assist Details for Transfer Assistance: VC for safe hand placement.  Increased time due to pain.       Exercises      Balance     End of Session OT - End of Session Equipment Utilized During Treatment: Back brace (RW) Activity Tolerance: Patient tolerated treatment well Patient left: in chair;with call bell/phone within reach Nurse  Communication: Patient requests pain meds;Mobility status  GO    10/10/2012 Cipriano Mile OTR/L Pager [phone] Office  [phone]  Cipriano Mile 10/10/2012, 8:43 AM

## 2012-10-10 NOTE — Progress Notes (Signed)
Physical Therapy Treatment Patient Details Name: Chelsea Benson MRN: [id number] DOB: 07/31/1951 Today's Date: 10/10/2012 Time: 6578-4696 PT Time Calculation (min): 17 min  PT Assessment / Plan / Recommendation Comments on Treatment Session  Pt POD# 2 lumbar fusion presenting with improved mobility from yesterday. Patient con't to have 8/10 pain but demo's safe transfer and ambuation technique with supervision. Patient safe to return home with family to provided 24/7 supervision/assist.    Follow Up Recommendations  Home health PT;Supervision/Assistance - 24 hour     Does the patient have the potential to tolerate intense rehabilitation     Barriers to Discharge        Equipment Recommendations  3 in 1 bedside comode;Tub/shower seat    Recommendations for Other Services    Frequency Min 3X/week   Plan Discharge plan remains appropriate    Precautions / Restrictions Precautions Precautions: Back Precaution Comments: patient able to recall 3/3 back precautions Required Braces or Orthoses: Spinal Brace Spinal Brace: Lumbar corset;Applied in sitting position Restrictions Weight Bearing Restrictions: No   Pertinent Vitals/Pain 8/10 at surgical site    Mobility  Bed Mobility Bed Mobility: Right Sidelying to Sit;Sit to Sidelying Right Right Sidelying to Sit: 4: Min guard;HOB flat (increaed time due to no rails) Sit to Sidelying Right: 5: Supervision;HOB flat (v/c's for technique) Transfers Transfers: Sit to Stand;Stand to Sit Sit to Stand: 5: Supervision;With upper extremity assist;With armrests;From chair/3-in-1 Stand to Sit: 5: Supervision;To chair/3-in-1;With armrests;With upper extremity assist Details for Transfer Assistance: v/c's for hand placement Ambulation/Gait Ambulation/Gait Assistance: 4: Min guard Ambulation Distance (Feet): 200 Feet Assistive device: Rolling walker Ambulation/Gait Assistance Details: safe technique Gait Pattern: Step-through  pattern;Decreased stride length Stairs: Yes Stairs Assistance: 4: Min assist Stairs Assistance Details (indicate cue type and reason): used HHA on R, and L handrail, pt reports daughter/husband to assist her on stairs due to no handrail. pt demo'd safe technique Number of Stairs: 3     Exercises     PT Diagnosis:    PT Problem List:   PT Treatment Interventions:     PT Goals Acute Rehab PT Goals PT Goal: Rolling Supine to Right Side - Progress: Progressing toward goal PT Goal: Supine/Side to Sit - Progress: Progressing toward goal PT Goal: Sit to Stand - Progress: Progressing toward goal PT Goal: Ambulate - Progress: Progressing toward goal PT Goal: Up/Down Stairs - Progress: Progressing toward goal Additional Goals PT Goal: Additional Goal #1 - Progress: Met  Visit Information  Last PT Received On: 10/10/12 Assistance Needed: +1    Subjective Data  Subjective: Pt received in R sidelying agreeable to PT. Pt with report of 8/10 back pain. Patient Stated Goal: home   Cognition  Overall Cognitive Status: Appears within functional limits for tasks assessed/performed Arousal/Alertness: Awake/alert Orientation Level: Appears intact for tasks assessed Behavior During Session: [facility] for tasks performed    Balance     End of Session PT - End of Session Equipment Utilized During Treatment: Gait belt;Back brace Activity Tolerance: Patient tolerated treatment well Patient left: in bed;with call bell/phone within reach;with family/visitor present Nurse Communication: Mobility status   GP     Marcene Brawn 10/10/2012, 12:09 PM  Lewis Shock, PT, DPT Pager #: [phone] Office #: [phone]

## 2012-10-11 LAB — GLUCOSE, CAPILLARY
Glucose-Capillary: 113 mg/dL — ABNORMAL HIGH (ref 70–99)
Glucose-Capillary: 95 mg/dL (ref 70–99)

## 2012-10-11 NOTE — Progress Notes (Signed)
Subjective: Patient reports she feels great pain is well controlled she feels up to going home today   Objective: Vital signs in last 24 hours: Temp:  [97.4 F (36.3 C)-100.7 F (38.2 C)] 100.7 F (38.2 C) (11/07 0200) Pulse Rate:  [80-97] 87  (11/07 0200) Resp:  [17-18] 18  (11/07 0200) BP: (106-117)/(57-69) 106/57 mmHg (11/07 0200) SpO2:  [94 %-98 %] 94 % (11/07 0200)  Intake/Output from previous day: 11/06 0701 - 11/07 0700 In: -  Out: 325 [Drains:325] Intake/Output this shift:    Strength 5 out of 5 wound clean and dry  Lab Results: No results found for this basename: WBC:2,HGB:2,HCT:2,PLT:2 in the last 72 hours BMET No results found for this basename: NA:2,K:2,CL:2,CO2:2,GLUCOSE:2,BUN:2,CREATININE:2,CALCIUM:2 in the last 72 hours  Studies/Results: No results found.  Assessment/Plan: discharge home  LOS: 3 days     Fabyan Loughmiller P 10/11/2012, 7:17 AM

## 2012-10-11 NOTE — Progress Notes (Signed)
Pt dc instructions provided. Pt verbalized understanding. Pt under no s/s distress. Pt iv d/c intact.

## 2012-10-11 NOTE — Discharge Summary (Signed)
Physician Discharge Summary  Patient ID: Chelsea Benson MRN: [id number] DOB/AGE: 1951/02/04 61 y.o.  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 *    Discharged Condition: good  Hospital Course: Patient is in the hospital underwent exploration of fusion removal of hardware L4-5 and a posterior lumbar interbody fusion L5-S1 postop patient did very well recovered in the floor patient on the floor was convalescing well was angling and voiding spontaneously tolerating a regular diet was still be discharged home postop day 3. Patient had her medicines are at home she did not need any prescriptions will see her back in one to 2 weeks in followup  Consults: Significant Diagnostic Studies: Treatments: Posterior lumbar interbody fusion L5-S1 redo posterior lateral fusion L4-5 Discharge Exam: Blood pressure 106/57, pulse 87, temperature 100.7 F (38.2 C), temperature source Oral, resp. rate 18, height 5\' 5"  (1.651 m), weight 76.5 kg (168 lb 10.4 oz), SpO2 94.00%. Strength out of 5 wound clean and dry  Disposition: Home     Medication List     As of 10/11/2012  7:20 AM    TAKE these medications         albuterol 108 (90 BASE) MCG/ACT inhaler   Commonly known as: PROVENTIL HFA;VENTOLIN HFA   Inhale 2 puffs into the lungs every 4 (four) hours as needed. For wheezing      beclomethasone 80 MCG/ACT inhaler   Commonly known as: QVAR   Inhale 1 puff into the lungs 2 (two) times daily.      dicyclomine 20 MG tablet   Commonly known as: BENTYL   Take 10-20 mg by mouth every 6 (six) hours as needed. For abdominal cramping      diltiazem 360 MG 24 hr capsule   Commonly known as: TIAZAC   Take 360 mg by mouth daily before breakfast.      EPIPEN 0.3 mg/0.3 mL Devi   Generic drug: EPINEPHrine   Inject 0.3 mg into the  muscle once as needed. For bee stings      FLUoxetine 20 MG capsule   Commonly known as: PROZAC   Take 20 mg by mouth daily before breakfast.      hydrOXYzine 25 MG tablet   Commonly known as: ATARAX/VISTARIL   Take 25-100 mg by mouth 3 (three) times daily as needed. Takes 1 tablet 3 times daily as needed and 4 tablets at bedtime as needed for sleep      metFORMIN 1000 MG tablet   Commonly known as: GLUCOPHAGE   Take 1,000 mg by mouth 2 (two) times daily with a meal.      mirtazapine 30 MG tablet   Commonly known as: REMERON   Take 30 mg by mouth at bedtime.      montelukast 10 MG tablet   Commonly known as: SINGULAIR   Take 10 mg by mouth at bedtime.      OVER THE COUNTER MEDICATION   Take 1 tablet by mouth daily as needed. For allergies. Takes [employer] Allergy      oxyCODONE-acetaminophen 7.5-325 MG per tablet   Commonly known as: PERCOCET   Take 1 tablet by mouth every 4 (four) hours as needed. For pain      ramipril 5 MG capsule   Commonly known as: ALTACE   Take 5 mg by mouth daily before breakfast.      simvastatin 40  MG tablet   Commonly known as: ZOCOR   Take 40 mg by mouth daily with breakfast.      tiZANidine 2 MG tablet   Commonly known as: ZANAFLEX   Take 2 mg by mouth every 8 (eight) hours as needed. For spasms      traZODone 150 MG tablet   Commonly known as: DESYREL   Take 300 mg by mouth at bedtime as needed. For sleep         Signed: Michelene Keniston P 10/11/2012, 7:20 AM

## 2013-05-11 DIAGNOSIS — M51379 Other intervertebral disc degeneration, lumbosacral region without mention of lumbar back pain or lower extremity pain: Secondary | ICD-10-CM | POA: Insufficient documentation

## 2013-05-11 DIAGNOSIS — M48061 Spinal stenosis, lumbar region without neurogenic claudication: Secondary | ICD-10-CM | POA: Insufficient documentation

## 2013-05-11 DIAGNOSIS — M5137 Other intervertebral disc degeneration, lumbosacral region: Secondary | ICD-10-CM | POA: Insufficient documentation

## 2013-09-06 DIAGNOSIS — M961 Postlaminectomy syndrome, not elsewhere classified: Secondary | ICD-10-CM | POA: Insufficient documentation

## 2014-08-07 DIAGNOSIS — F329 Major depressive disorder, single episode, unspecified: Secondary | ICD-10-CM | POA: Insufficient documentation

## 2014-08-07 DIAGNOSIS — F32A Depression, unspecified: Secondary | ICD-10-CM | POA: Insufficient documentation

## 2014-10-06 DIAGNOSIS — R519 Headache, unspecified: Secondary | ICD-10-CM | POA: Insufficient documentation

## 2014-10-06 DIAGNOSIS — I10 Essential (primary) hypertension: Secondary | ICD-10-CM | POA: Insufficient documentation

## 2014-10-06 DIAGNOSIS — G5621 Lesion of ulnar nerve, right upper limb: Secondary | ICD-10-CM | POA: Insufficient documentation

## 2014-10-06 DIAGNOSIS — J301 Allergic rhinitis due to pollen: Secondary | ICD-10-CM | POA: Insufficient documentation

## 2014-10-06 DIAGNOSIS — R51 Headache: Secondary | ICD-10-CM

## 2014-10-06 DIAGNOSIS — J45909 Unspecified asthma, uncomplicated: Secondary | ICD-10-CM | POA: Insufficient documentation

## 2014-10-06 DIAGNOSIS — M1990 Unspecified osteoarthritis, unspecified site: Secondary | ICD-10-CM | POA: Insufficient documentation

## 2014-12-23 DIAGNOSIS — F332 Major depressive disorder, recurrent severe without psychotic features: Secondary | ICD-10-CM | POA: Diagnosis not present

## 2014-12-24 DIAGNOSIS — F332 Major depressive disorder, recurrent severe without psychotic features: Secondary | ICD-10-CM | POA: Diagnosis not present

## 2015-01-07 ENCOUNTER — Inpatient Hospital Stay: Admit: 2015-01-07 | Payer: PRIVATE HEALTH INSURANCE | Primary: Family Medicine

## 2015-01-07 DIAGNOSIS — M6281 Muscle weakness (generalized): Secondary | ICD-10-CM

## 2015-01-07 NOTE — Progress Notes (Signed)
PT DAILY TREATMENT NOTE - MCR 8-14    Patient Name: Horace PorteousFrankie M Territo  Date:01/07/2015  DOB: 11/20/1951    Patient DOB Verified  Payor: [place] [employer] / Plan: [place] [employer] / Product Type: Managed Care Medicare /    In time:1120  Out time:1210  Total Treatment Time (min): Eval + 24  Total Timed Codes (min): Eval +24  1:1 Treatment Time (MC only): 50   Visit #: 1 of 10    Treatment Area: Muscle weakness (generalized) [M62.81]    SUBJECTIVE  Pain Level (0-10 scale): 10  Any medication changes, allergies to medications, adverse drug reactions, diagnosis change, or new procedure performed?:  No     Yes (see summary sheet for update)  Subjective functional status/changes:    No changes reported  Pt c/o back, knee and hand weakness.  She reports difficulty with grasping and holding things tight.  Pt reports no pain in the feet, they just feel week.  Pt reports that she has trouble walking and states that she falls all of the time.  Last fall was Jan 24 after putting magazines under her table then attempting to stand and walk resulted in her falling.  Onset for almost a year.  Pt reports that she has some back pain that causes her to lay down most of the day.  Pain is ongoing in her back.  (R) Medial meniscus repair. June 23,2015     Subjective:    What type of work do you do?     What type of activities do you like to do? Adriana SimasCook, go for walks, general house work,     Goal:  Hands get stronger, open cans with can openers, open cabinets, want to stop falling    PLOF:  Mod (I) with walking with SPC, able to use hands to do usual house work    OBJECTIVE  Inspection:  - TTP (B) superior glute, (B) PSIS,  - Worn areas at (B) shoe toes   - Good general LE strength  - Edema (R) Knee  - (B) Trendelenburg (R) > (L)    Gait: Slow reciprocal, (B) toe out, decreased hip flx,    Girth:  5 cm above   5 cm below    Grip:  (R) 44    (L) 42    Balance:  SLS (R) 17    (L) 20    Berg:     Get up and Go: 19, 12, 12    Sensation:    Special Test:         AROM  PROM  STRENGTH PAIN    Right Left Right Left Right Left    Hip Flx     4+ 4+    Hip Ext     5 5    Knee Flx 143 148   5 5    Knee Ext     5- 5-    DF     5 5    PF     5 5    Toe DF          Toe PF          HS 90/90          SLR          Biceps     4 4+    Tri     3+ 4+    Wrist flx     5  5    Wrist ext     5 5                              ROM Right Left   Rotation     SB     Flx         Pelvic Alignment Right Left TTP   Crest      PSIS      ASIS          10 min Manual Therapy:  Effleurage (R) knee   Rationale: decrease pain, increase ROM and increase tissue extensibility to tolerated increased activity levels  14 min Therapeutic Exercise:   See flow sheet : Gait training   Rationale: increase ROM, increase strength and improve coordination to improve the patient???s ability to tolerated increased activity levels            With    TE    TA    neuro    other: Patient Education:  Review HEP     Progressed/Changed HEP based on:    positioning    body mechanics    transfers    heat/ice application     other:             Other Objective/Functional Measures: See above     Pain Level (0-10 scale) post treatment: 0    ASSESSMENT/Changes in Function:      Patient will continue to benefit from skilled PT services to modify and progress therapeutic interventions, address functional mobility deficits, address ROM deficits, address strength deficits, analyze and address soft tissue restrictions, analyze and cue movement patterns and analyze and modify body mechanics/ergonomics to attain remaining goals.       See Plan of Care    See progress note/recertification    See Discharge Summary         Progress towards goals / Updated goals:  1.  Pt will be compliant and independent with HEP in order to facilitate PT sessions and aid with self management  2.  Pt to tolerate 30 min or more of TE and/or Interventions w/o increased s/s     1.  Pt will report 50% improvement or better with involvement to show a significant increase in function  2.  Pt will increase (B) grip to 50# to increase grip strength and prevent pt from dropping items.  3.  Pt will ambulate 500 feet (I) w/o toe drag or deviations so that she can perform usual walking without falling   4.  Pt will report no falls while walking for a 2 week period for carry over to safe community ambulation   4.  Pt will improve FOTO score to 49 to show significant improvement in function for progress to little difficulty with usual daily house duties    PLAN    Upgrade activities as tolerated       Continue plan of care    Update interventions per flow sheet         Discharge due to:_    Other:_      Theotis Barrio, PT 01/07/2015  11:23 AM

## 2015-01-07 NOTE — Progress Notes (Signed)
[facility] ??? [facility]  [address]  [place], [place] [id number]  [phone] fax    Plan of Care/ Statement of Necessity for Physical Therapy Services    Patient name: Cynthia Marsh Start of Care: 01/07/2015   Referral source: Shawn StallHarrington, Thomas A, MD DOB: 12/10/1950    Medical Diagnosis: Muscle weakness (generalized) [M62.81]   Onset Date: About a year ago    Treatment Diagnosis: Gait, UE weakness   Prior Hospitalization: see medical history Provider#: [id number]   Medications: Verified on Patient summary List    Comorbidities: Depression, Osteoporosis, DM, OA, HTN, Asthma, Visual Impaired   Prior Level of Function: Mod (I) with walking with SPC, able to use hands to do usual house work      [employer] Plan of Care and following information is based on the information from the initial evaluation.  Assessment/ key information: Patient is a 64 y.o. female referred to PT with the above Dx.  Patient seen today for c/o back pain, UE/LE weakness and frequent falls.  Patient presents to PT with an impaired gait, fair to good balance, decreased strength, decreased flexibility, and decreased mobility.  She has a pelvic obliquity, (+) (B) Trendelenburg and edema at the (R) knee.  Patient s/s appear to be consistent w/ diagnosis.  Patient demonstrates the potential to make functional gains within a reasonable time frame.  Patient will benefit from skilled PT to address impairments and improve functional mobility, strength, gait and balance for an improved quality of life.    Fall Risk Assessment: Patient demonstrates a low to medium Fall Risk     Problem List: pain affecting function, decrease ROM, decrease strength, edema affecting function, impaired gait/ balance, decrease ADL/ functional abilitiies, decrease activity tolerance, decrease flexibility/ joint mobility, decrease transfer abilities and other FOTO = 49   Treatment Plan may include any combination of the following: Therapeutic  exercise, Therapeutic activities, Neuromuscular re-education, Physical agent/modality, Gait/balance training, Manual therapy, Patient education, Self Care training, Functional mobility training, Home safety training and Stair training  Patient / Family readiness to learn indicated by: asking questions, trying to perform skills and interest  Persons(s) to be included in education: patient (P)  Barriers to Learning/Limitations: yes;  sensory deficits-vision  Measures taken:  Ensure written and verbal communication is understood by patient  Patient Goal (s): ???Hands get stronger, open cans with can openers, open cabinets, want to stop falling???  Patient Self Reported Health Status: fair  Rehabilitation Potential: good    Short Term Goals: To be accomplished in 5  treatments:  1.?? Pt will be compliant and independent with HEP in order to facilitate PT sessions and aid with self management  2.?? Pt to tolerate 30 min or more of TE and/or Interventions w/o increased s/s    Long Term Goals: To be accomplished in 10  treatments:  1.?? Pt will report 50% improvement or better with involvement to show a significant increase in function  2.?? Pt will increase (B) grip to 50# to increase grip strength and prevent pt from dropping items.  3.?? Pt will ambulate 500 feet (I) w/o toe drag or deviations so that she can perform usual walking without falling ??  4.?? Pt will report no falls while walking for a 2 week period for carry over to safe community ambulation ??  4.?? Pt will improve FOTO score to 49 to show significant improvement in function for progress to little difficulty with usual daily house duties  Frequency / Duration: Patient to be seen 2-3 times per week for 13  weeks.    Patient/ Caregiver education and instruction: Diagnosis, prognosis, self care, activity modification and exercises     Plan of care has been reviewed with PTA    G-Codes (GP)  Mobility  [id number] Current  CK= 40-59%  [id number] Goal  CK= 40-59%       The severity rating is based on clinical judgment and the FOTO score.    Certification Period: 01/07/15 - 04/07/15   Cynthia Cunanan T. Senaida Ores. , PT 01/07/2015 1:47 PM    ________________________________________________________________________    I certify that the above Therapy Services are being furnished while the patient is under my care. I agree with the treatment plan and certify that this therapy is necessary.    Physician's Signature:____________________  Date:____________Time: _________    Please sign and return to [facility] ??? [facility]    [address] [place], [place] [address]  [phone]   [phone] fax

## 2015-01-14 ENCOUNTER — Inpatient Hospital Stay: Admit: 2015-01-14 | Payer: PRIVATE HEALTH INSURANCE | Primary: Family Medicine

## 2015-01-14 NOTE — Progress Notes (Signed)
PT DAILY TREATMENT NOTE - MCR 8-14    Patient Name: Cynthia Marsh  Date:01/14/2015  DOB: [birth year]    Patient DOB Verified  Payor: [place] [employer] / Plan: [place] [employer] / Product Type: Managed Care Medicare /    In time:0900  Out time:1009  Total Treatment Time (min): 65  Total Timed Codes (min): 65  1:1 Treatment Time ([place] only): 72   Visit #: 2 of 10    Treatment Area: Muscle weakness (generalized) [M62.81]    SUBJECTIVE  Pain Level (0-10 scale): 5  Any medication changes, allergies to medications, adverse drug reactions, diagnosis change, or new procedure performed?:  No     Yes (see summary sheet for update)  Subjective functional status/changes:    No changes reported  Was doing fine since initial visit until yesterday when she had to do a lot of shoping    OBJECTIVE  56  1:1  46 min Therapeutic Exercise:   See flow sheet :   Rationale: increase ROM, increase strength and improve coordination to improve the patient???s ability to tolerated increased activity levels  9 min Therapeutic Activity:    See flow sheet :   Rationale: Improve ambulation  to improve the patient???s ability to tolerated increased activity levels.                  With    TE    TA    neuro    other: Patient Education:  Review HEP     Progressed/Changed HEP based on:    positioning    body mechanics    transfers    heat/ice application     other:             Other Objective/Functional Measures:       Pain Level (0-10 scale) post treatment: 0    ASSESSMENT/Changes in Function:   - Good tolerance with first full treatment    Patient will continue to benefit from skilled PT services to modify and progress therapeutic interventions, address functional mobility deficits, address ROM deficits, address strength deficits, analyze and address soft tissue restrictions, analyze and cue movement patterns and analyze and modify body mechanics/ergonomics to attain remaining goals.       See Plan of Care     See progress note/recertification    See Discharge Summary         Short Term Goals: To be accomplished in 5?? treatments:  1.?? Pt will be compliant and independent with HEP in order to facilitate PT sessions and aid with self management  2.?? Pt to tolerate 30 min or more of TE and/or Interventions w/o increased s/s - Met 01/14/15    Long Term Goals: To be accomplished in 10?? treatments:  1.?? Pt will report 50% improvement or better with involvement to show a significant increase in function  2.?? Pt will increase (B) grip to 50# to increase grip strength and prevent pt from dropping items.  3.?? Pt will ambulate 500 feet (I) w/o toe drag or deviations so that she can perform usual walking without falling ??  4.?? Pt will report no falls while walking for a 2 week period for carry over to safe community ambulation ??  4.?? Pt will improve FOTO score to 49 to show significant improvement in function for progress to little difficulty with usual daily house duties    PLAN    Upgrade activities as tolerated  Continue plan of care    Update interventions per flow sheet         Discharge due to:_    Other:_      Cynthia Marsh, PT 01/14/2015  9:03 AM

## 2015-01-16 ENCOUNTER — Inpatient Hospital Stay: Admit: 2015-01-16 | Payer: PRIVATE HEALTH INSURANCE | Primary: Family Medicine

## 2015-01-16 NOTE — Progress Notes (Signed)
PT DAILY TREATMENT NOTE - MCR 8-14    Patient Name: Cynthia Marsh  Date:01/16/2015  DOB: [birth year]    Patient DOB Verified  Payor: [place] [employer] / Plan: [place] / Product Type: Managed Care Medicare /    In time:9:30  Out time:10:09  Total Treatment Time (min): 39  Total Timed Codes (min): 39  1:1 Treatment Time ([place] only): 19  Visit #: 3 of 10    Treatment Area: Muscle weakness (generalized) [M62.81]    SUBJECTIVE  Pain Level (0-10 scale): (L)  Knee  7  Any medication changes, allergies to medications, adverse drug reactions, diagnosis change, or new procedure performed?:  No     Yes (see summary sheet for update)  Subjective functional status/changes:    No changes reported  Knee  Is hurting.    OBJECTIVE    Modality rationale: decrease edema, decrease inflammation, decrease pain and increase tissue extensibility to improve the patient???s ability to perform ADL   Min Type Additional Details     Estim: Att   Unatt        TENS instruct                  IFC  Premod   NMES                     Other:  w/US   w/ice   w/heat  Position:  Location:      Traction:  Cervical       Lumbar                        Prone          Supine                       Intermittent   Continuous Lbs:   before manual   after manual      Ultrasound: Continuous    Pulsed                           1MHz   3MHz Location:  W/cm2:      Iontophoresis with dexamethasone         Location:  Take home patch    In clinic      Ice       heat    Ice massage Position:  Location:      Laser    Other: Position:  Location:      Vasopneumatic Device Pressure:        lo  med  hi   Temperature:  lo  med  hi    Skin assessment post-treatment:  intact redness- no adverse reaction    redness ??? adverse reaction:     39 min Therapeutic Exercise:   See flow sheet :   Rationale: increase ROM and increase strength to improve the patient???s ability to perform ADL     min Therapeutic Activity:    See flow sheet :    Rationale:   to improve the patient???s ability to      min Neuromuscular Re-education:    See flow sheet :   Rationale:   to improve the patient???s ability to      min Manual Therapy:     Rationale: decrease pain, increase ROM, increase tissue extensibility and decrease edema  to perform ADL  min Gait Training:  ___ feet with ___ device on level surfaces with ___ level of assist   Rationale:          With    TE    TA    neuro    other: Patient Education:  Review HEP     Progressed/Changed HEP based on:    positioning    body mechanics    transfers    heat/ice application     other:      Other Objective/Functional Measures:      Pain Level (0-10 scale) post treatment: 0    ASSESSMENT/Changes in Function: Completed   Each there ex  Fairly well.Benefited   With  Treatment  Today.    Patient will continue to benefit from skilled PT services to address functional mobility deficits, address ROM deficits, address strength deficits and instruct in home and community integration to attain remaining goals.       See Plan of Care    See progress note/recertification    See Discharge Summary         Progress towards goals / Updated goals:  1.?? Pt will be compliant and independent with HEP in order to facilitate PT sessions and aid with self management  2.?? Pt to tolerate 30 min or more of TE and/or Interventions w/o increased s/s - Met 01/14/15    Long Term Goals: To be accomplished in 10?? treatments:  1.?? Pt will report 50% improvement or better with involvement to show a significant increase in function  2.?? Pt will increase (B) grip to 50# to increase grip strength and prevent pt from dropping items.  3.?? Pt will ambulate 500 feet (I) w/o toe drag or deviations so that she can perform usual walking without falling ??  4.?? Pt will report no falls while walking for a 2 week period for carry over to safe community ambulation ??  4.?? Pt will improve FOTO score to 49 to show significant improvement in  function for progress to little difficulty with usual daily house duties      PLAN    Upgrade activities as tolerated       Continue plan of care    Update interventions per flow sheet         Discharge due to:_    Other:_      Deirdre Gryder, PTA 01/16/2015  9:46 AM

## 2015-01-19 ENCOUNTER — Inpatient Hospital Stay: Admit: 2015-01-19 | Payer: PRIVATE HEALTH INSURANCE | Primary: Family Medicine

## 2015-01-19 NOTE — Progress Notes (Signed)
PT DAILY TREATMENT NOTE - MCR 8-14    Patient Name: Cynthia Marsh  Date:01/19/2015  DOB: 11/26/1951    Patient DOB Verified  Payor: [place] [employer] / Plan: [place] / Product Type: Managed Care Medicare /    In time:10:05  Out time:10:58  Total Treatment Time (min): 50  Total Timed Codes (min): 50  1:1 Treatment Time ([place] only): 40   Visit #: 4 of 10    Treatment Area: Muscle weakness (generalized) [M62.81]    SUBJECTIVE  Pain Level (0-10 scale): 0  Any medication changes, allergies to medications, adverse drug reactions, diagnosis change, or new procedure performed?:  No     Yes (see summary sheet for update)  Subjective functional status/changes:    No changes reported  I had  Virus.    OBJECTIVE    Modality rationale: decrease edema, decrease inflammation, decrease pain and increase tissue extensibility to improve the patient???s ability to perform ADL   Min Type Additional Details     Estim: Att   Unatt        TENS instruct                  IFC  Premod   NMES                     Other:  w/US   w/ice   w/heat  Position:  Location:      Traction:  Cervical       Lumbar                        Prone          Supine                       Intermittent   Continuous Lbs:   before manual   after manual      Ultrasound: Continuous    Pulsed                           1MHz   3MHz Location:  W/cm2:      Iontophoresis with dexamethasone         Location:  Take home patch    In clinic      Ice       heat    Ice massage Position:  Location:      Laser    Other: Position:  Location:      Vasopneumatic Device Pressure:        lo  med  hi   Temperature:  lo  med  hi    Skin assessment post-treatment:  intact redness- no adverse reaction    redness ??? adverse reaction:     50  1:1  40 min Therapeutic Exercise:   See flow sheet :   Rationale: increase ROM and increase strength to improve the patient???s ability to perform ADL     min Therapeutic Activity:    See flow sheet :    Rationale:   to improve the patient???s ability to       min Neuromuscular Re-education:    See flow sheet :   Rationale:   to improve the patient???s ability to      min Manual Therapy:     Rationale: decrease pain, increase ROM, increase tissue extensibility and decrease edema  to perform  ADL     min Gait Training:  ___ feet with ___ device on level surfaces with ___ level of assist   Rationale:          With    TE    TA    neuro    other: Patient Education:  Review HEP     Progressed/Changed HEP based on:    positioning    body mechanics    transfers    heat/ice application     other:      Other Objective/Functional Measures:      Pain Level (0-10 scale) post treatment: (R)  Knee  6    ASSESSMENT/Changes in Function: completed  Each there ex  Fairly  Well.  C/o  Pain at (R)  Knee  Due  To squats.  Discussed  wtih pt on  Performing  Squats  At comfortable  Pace.    Patient will continue to benefit from skilled PT services to address functional mobility deficits, address ROM deficits, address strength deficits and instruct in home and community integration to attain remaining goals.       See Plan of Care    See progress note/recertification    See Discharge Summary         Progress towards goals / Updated goals:  1.?? Pt will be compliant and independent with HEP in order to facilitate PT sessions and aid with self management  2.?? Pt to tolerate 30 min or more of TE and/or Interventions w/o increased s/s - Met 01/14/15    Long Term Goals: To be accomplished in 10?? treatments:  1.?? Pt will report 50% improvement or better with involvement to show a significant increase in function  2.?? Pt will increase (B) grip to 50# to increase grip strength and prevent pt from dropping items.  3.?? Pt will ambulate 500 feet (I) w/o toe drag or deviations so that she can perform usual walking without falling ??  4.?? Pt will report no falls while walking for a 2 week period for carry over to safe community ambulation ??   4.?? Pt will improve FOTO score to 49 to show significant improvement in function for progress to little difficulty with usual daily house duties        PLAN    Upgrade activities as tolerated       Continue plan of care    Update interventions per flow sheet         Discharge due to:_Trial  AMB/SLS  [place]    Other:_      Damarion Mendizabal, PTA 01/19/2015  10:15 AM

## 2015-01-22 ENCOUNTER — Inpatient Hospital Stay: Admit: 2015-01-22 | Payer: PRIVATE HEALTH INSURANCE | Primary: Family Medicine

## 2015-01-22 NOTE — Progress Notes (Signed)
PT DAILY TREATMENT NOTE - MCR 8-14    Patient Name: Cynthia Marsh  Date:01/22/2015  DOB: [birth year]    Patient DOB Verified  Payor: [place] [employer] / Plan: [place] / Product Type: Managed Care Medicare /    In time:900  Out time:1009   Total Treatment Time (min): 59  Total Timed Codes (min): 59  1:1 Treatment Time ([place] only): 20   Visit #: 5 of 10    Treatment Area: Muscle weakness (generalized) [M62.81]    SUBJECTIVE  Pain Level (0-10 scale): 0  Any medication changes, allergies to medications, adverse drug reactions, diagnosis change, or new procedure performed?:  No     Yes (see summary sheet for update)  Subjective functional status/changes:    No changes reported  I am feeling stronger since I started    OBJECTIVE    Modality rationale:     Min Type Additional Details     Estim: Att   Unatt        TENS instruct                  IFC  Premod   NMES                     Other:  w/US   w/ice   w/heat  Position:  Location:      Traction:  Cervical       Lumbar                        Prone          Supine                       Intermittent   Continuous Lbs:   before manual   after manual      Ultrasound: Continuous    Pulsed                           1MHz   3MHz Location:  W/cm2:      Iontophoresis with dexamethasone         Location:  Take home patch    In clinic      Ice       heat    Ice massage Position:  Location:      Laser    Other: Position:  Location:      Vasopneumatic Device Pressure:        lo  med  hi   Temperature:  lo  med  hi    Skin assessment post-treatment:  intact redness- no adverse reaction    redness ??? adverse reaction:     51  1:1=30 min Therapeutic Exercise:   See flow sheet :   Rationale: increase ROM, increase strength and improve coordination to improve the patient???s ability to aid with increase tolerance to ADLS and activities     min Therapeutic Activity:    See flow sheet :   Rationale:   to improve the patient???s ability to       8 min Neuromuscular Re-education:    See flow sheet :   Rationale: improve coordination, improve balance and increase proprioception  to improve the patient???s ability to aid with improved safety for gait     min Manual Therapy:     Rationale:  to      min  Gait Training:  ___ feet with ___ device on level surfaces with ___ level of assist   Rationale:          With    TE    TA    neuro    other: Patient Education:  Review HEP     Progressed/Changed HEP based on:    positioning    body mechanics    transfers    heat/ice application     other:      Other Objective/Functional Measures: FOTO 98, bike increased to 1.5 level     Pain Level (0-10 scale) post treatment: 0    ASSESSMENT/Changes in Function: Note significant improved FOTO and patient correlates feeling stronger and better since beginning PT.    Patient will continue to benefit from skilled PT services to modify and progress therapeutic interventions, address functional mobility deficits, address ROM deficits, address strength deficits, analyze and address soft tissue restrictions, analyze and cue movement patterns, analyze and modify body mechanics/ergonomics, assess and modify postural abnormalities and instruct in home and community integration to attain remaining goals.       See Plan of Care    See progress note/recertification    See Discharge Summary         Progress towards goals / Updated goals:  1.?? Pt will be compliant and independent with HEP in order to facilitate PT sessions and aid with self management- met 01/22/15  2.?? Pt to tolerate 30 min or more of TE and/or Interventions w/o increased s/s - Met 01/14/15    Long Term Goals: To be accomplished in 10?? treatments:  1.?? Pt will report 50% improvement or better with involvement to show a significant increase in function  2.?? Pt will increase (B) grip to 50# to increase grip strength and prevent pt from dropping items.  3.?? Pt will ambulate 500 feet (I) w/o toe drag or deviations so that she  can perform usual walking without falling - met 01/22/15??  4.?? Pt will report no falls while walking for a 2 week period for carry over to safe community ambulation ??  4.?? Pt will improve FOTO score to 49 to show significant improvement in function for progress to little difficulty with usual daily house duties- met 98 01/22/15      PLAN    Upgrade activities as tolerated       Continue plan of care    Update interventions per flow sheet         Discharge due to:_    Other:_      Forrest Moron, PT 01/22/2015  9:06 AM

## 2015-01-26 ENCOUNTER — Inpatient Hospital Stay: Admit: 2015-01-26 | Payer: PRIVATE HEALTH INSURANCE | Primary: Family Medicine

## 2015-01-26 NOTE — Progress Notes (Signed)
PT DAILY TREATMENT NOTE - MCR 8-14    Patient Name: Cynthia Marsh  Date:01/26/2015  DOB: 1951-01-03    Patient DOB Verified  Payor: [place] [employer] / Plan: [facility] [place] [employer] / Product Type: Managed Care Medicare /    In time:9:35  Out time:10:20  Total Treatment Time (min): 45  Total Timed Codes (min): 45  1:1 Treatment Time ([facility] only): 45  Visit #: 6 of 10    Treatment Area: Muscle weakness (generalized) [M62.81]    SUBJECTIVE  Pain Level (0-10 scale): 0  Any medication changes, allergies to medications, adverse drug reactions, diagnosis change, or new procedure performed?:  No     Yes (see summary sheet for update)  Subjective functional status/changes:    No changes reported  No pain  At  All this weekend.  Didn't  Take any  Pain meds.  Allergies  Just acting up.    OBJECTIVE    Modality rationale: decrease edema, decrease inflammation, decrease pain and increase tissue extensibility to improve the patient???s ability to perform  ADL   Min Type Additional Details     Estim: Att   Unatt        TENS instruct                  IFC  Premod   NMES                     Other:  w/US   w/ice   w/heat  Position:  Location:      Traction:  Cervical       Lumbar                        Prone          Supine                       Intermittent   Continuous Lbs:   before manual   after manual      Ultrasound: Continuous    Pulsed                             Location:  W/cm2:      Iontophoresis with dexamethasone         Location:  Take home patch    In clinic      Ice       heat    Ice massage Position:  Location:      Laser    Other: Position:  Location:      Vasopneumatic Device Pressure:        lo  med  hi   Temperature:  lo  med  hi    Skin assessment post-treatment:  intact redness- no adverse reaction    redness ??? adverse reaction:     45 min Therapeutic Exercise:   See flow sheet :   Rationale: increase ROM and increase strength to improve the patient???s  ability to perform ADL     min Therapeutic Activity:    See flow sheet :   Rationale:   to improve the patient???s ability to       min Neuromuscular Re-education:    See flow sheet :   Rationale:   to improve the patient???s ability to      min Manual Therapy:  Rationale: decrease pain, increase ROM, increase tissue extensibility and decrease edema  to perform ADL      min Gait Training:  ___ feet with ___ device on level surfaces with ___ level of assist   Rationale:          With    TE    TA    neuro    other: Patient Education:  Review HEP     Progressed/Changed HEP based on:    positioning    body mechanics    transfers    heat/ice application     other:      Other Objective/Functional Measures:  Increased  Rep  Per  Flow  sheet    Pain Level (0-10 scale) post treatment: 0    ASSESSMENT/Changes in Function: Completed   Each there ex  Fairly  Well.    Patient will continue to benefit from skilled PT services to address functional mobility deficits, address ROM deficits, address strength deficits and instruct in home and community integration to attain remaining goals.       See Plan of Care    See progress note/recertification    See Discharge Summary         Progress towards goals / Updated goals:  1.?? Pt will be compliant and independent with HEP in order to facilitate PT sessions and aid with self management- met 01/22/15  2.?? Pt to tolerate 30 min or more of TE and/or Interventions w/o increased s/s - Met 01/14/15    Long Term Goals: To be accomplished in 10?? treatments:  1.?? Pt will report 50% improvement or better with involvement to show a significant increase in function  2.?? Pt will increase (B) grip to 50# to increase grip strength and prevent pt from dropping items.  3.?? Pt will ambulate 500 feet (I) w/o toe drag or deviations so that she can perform usual walking without falling - met 01/22/15??  4.?? Pt will report no falls while walking for a 2 week period for carry over to safe community ambulation ??   4.?? Pt will improve FOTO score to 49 to show significant improvement in function for progress to little difficulty with usual daily house duties- met 98 01/22/15        PLAN    Upgrade activities as tolerated       Continue plan of care    Update interventions per flow sheet         Discharge due to:_    Other:_ REASSESS  Grip  Strength  NV     Shanie Mauzy, PTA 01/26/2015  9:46 AM

## 2015-01-28 ENCOUNTER — Inpatient Hospital Stay: Admit: 2015-01-28 | Payer: PRIVATE HEALTH INSURANCE | Primary: Family Medicine

## 2015-01-28 NOTE — Progress Notes (Signed)
PT DAILY TREATMENT NOTE - MCR 8-14    Patient Name: Cynthia Marsh  Date:01/28/2015  DOB: [birth year]    Patient DOB Verified  Payor: [place] [employer] / Plan: [place] / Product Type: Managed Care Medicare /    In time:847  Out time:938  Total Treatment Time (min): 52  Total Timed Codes (min): 52  1:1 Treatment Time ([place] only): 52  Visit #: 7 of 10    Treatment Area: Muscle weakness (generalized) [M62.81]    SUBJECTIVE  Pain Level (0-10 scale):0  Any medication changes, allergies to medications, adverse drug reactions, diagnosis change, or new procedure performed?:  No     Yes (see summary sheet for update)  Subjective functional status/changes:    No changes reported  Doing good    OBJECTIVE    Modality rationale:     Min Type Additional Details     Estim: Att   Unatt        TENS instruct                  IFC  Premod   NMES                     Other:  w/US   w/ice   w/heat  Position:  Location:      Traction:  Cervical       Lumbar                        Prone          Supine                       Intermittent   Continuous Lbs:   before manual   after manual      Ultrasound: Continuous    Pulsed                           1MHz   3MHz Location:  W/cm2:      Iontophoresis with dexamethasone         Location:  Take home patch    In clinic      Ice       heat    Ice massage Position:  Location:      Laser    Other: Position:  Location:      Vasopneumatic Device Pressure:        lo  med  hi   Temperature:  lo  med  hi    Skin assessment post-treatment:  intact redness- no adverse reaction    redness ??? adverse reaction:     44 min Therapeutic Exercise:   See flow sheet :   Rationale: increase ROM, increase strength and improve coordination to improve the patient???s ability to aid with increase tolerance to ADLs     min Therapeutic Activity:    See flow sheet :   Rationale:   to improve the patient???s ability to      8 min Neuromuscular Re-education:    See flow sheet :    Rationale: improve coordination, improve balance and increase proprioception  to improve the patient???s ability to aid with increase tolerance to ADLs     min Manual Therapy:     Rationale:  to      min Gait Training:  ___ feet with ___ device on level surfaces with  ___ level of assist   Rationale:          With    TE    TA    neuro    other: Patient Education:  Review HEP     Progressed/Changed HEP based on:    positioning    body mechanics    transfers    heat/ice application     other:      Other Objective/Functional Measures: tol well including increased resistance on bike to 2.5, increased web to green     Pain Level (0-10 scale) post treatment: 0    ASSESSMENT/Changes in Function: Patient demonstrates the potential to make further functional gains within a reasonable time frame. Patient requires continued skilled Physical Therapy so as to further maximize their strength, ROM, functional mobility and tolerance to ADLs and activities. Patient appears to be an appropriate candidate for skilled outpatient Physical Therapy.    Patient will continue to benefit from skilled PT services to modify and progress therapeutic interventions, address functional mobility deficits, address ROM deficits, address strength deficits, analyze and address soft tissue restrictions, analyze and cue movement patterns, analyze and modify body mechanics/ergonomics, assess and modify postural abnormalities and instruct in home and community integration to attain remaining goals.       See Plan of Care    See progress note/recertification    See Discharge Summary         Progress towards goals / Updated goals:  1.?? Pt will be compliant and independent with HEP in order to facilitate PT sessions and aid with self management- met 01/22/15  2.?? Pt to tolerate 30 min or more of TE and/or Interventions w/o increased s/s - Met 01/14/15    Long Term Goals: To be accomplished in 10?? treatments:   1.?? Pt will report 50% improvement or better with involvement to show a significant increase in function  2.?? Pt will increase (B) grip to 50# to increase grip strength and prevent pt from dropping items.  3.?? Pt will ambulate 500 feet (I) w/o toe drag or deviations so that she can perform usual walking without falling - met 01/22/15??  4.?? Pt will report no falls while walking for a 2 week period for carry over to safe community ambulation ??  4.?? Pt will improve FOTO score to 49 to show significant improvement in function for progress to little difficulty with usual daily house duties- met 98 01/22/15        PLAN    Upgrade activities as tolerated       Continue plan of care    Update interventions per flow sheet         Discharge due to:_    Other:_      Forrest Moron, PT 01/28/2015  8:56 AM

## 2015-01-30 ENCOUNTER — Inpatient Hospital Stay: Admit: 2015-01-30 | Payer: PRIVATE HEALTH INSURANCE | Primary: Family Medicine

## 2015-01-30 NOTE — Progress Notes (Signed)
PT DAILY TREATMENT NOTE - MCR 8-14    Patient Name: Cynthia Marsh  Date:01/30/2015  DOB: [birth year]    Patient DOB Verified  Payor: [place] [employer] / Plan: [place] / Product Type: Managed Care Medicare /    In time:9:33  Out time:10:22  Total Treatment Time (min): 49  Total Timed Codes (min): 49  1:1 Treatment Time ([place] only): 38  Visit #: 8 of 10    Treatment Area: Muscle weakness (generalized) [M62.81]    SUBJECTIVE  Pain Level (0-10 scale): 0  Any medication changes, allergies to medications, adverse drug reactions, diagnosis change, or new procedure performed?:  No     Yes (see summary sheet for update)  Subjective functional status/changes:    No changes reported  No pain  At LE  But  Back pain.    OBJECTIVE    Modality rationale: decrease edema, decrease inflammation, decrease pain and increase tissue extensibility to improve the patient???s ability to perform ADL   Min Type Additional Details     Estim: Att   Unatt        TENS instruct                  IFC  Premod   NMES                     Other:  w/US   w/ice   w/heat  Position:  Location:      Traction:  Cervical       Lumbar                        Prone          Supine                       Intermittent   Continuous Lbs:   before manual   after manual      Ultrasound: Continuous    Pulsed                           1MHz   3MHz Location:  W/cm2:      Iontophoresis with dexamethasone         Location:  Take home patch    In clinic      Ice       heat    Ice massage Position:  Location:      Laser    Other: Position:  Location:      Vasopneumatic Device Pressure:        lo  med  hi   Temperature:  lo  med  hi    Skin assessment post-treatment:  intact redness- no adverse reaction    redness ??? adverse reaction:     49 min Therapeutic Exercise:   See flow sheet :   Rationale: increase ROM and increase strength to improve the patient???s ability to perform ADL     min Therapeutic Activity:    See flow sheet :    Rationale:   to improve the patient???s ability to       min Neuromuscular Re-education:    See flow sheet :   Rationale:   to improve the patient???s ability to      min Manual Therapy:     Rationale: decrease pain, increase ROM, increase tissue extensibility and decrease edema  to perform  ADL     min Gait Training:  ___ feet with ___ device on level surfaces with ___ level of assist   Rationale:          With    TE    TA    neuro    other: Patient Education:  Review HEP     Progressed/Changed HEP based on:    positioning    body mechanics    transfers    heat/ice application     other:      Other Objective/Functional Measures: added  1.5#  SLR     Pain Level (0-10 scale) post treatment: 0  ASSESSMENT/Changes in Function: Completed  Each there ex  Fairly well.  Pain  At [facility]SP was resolved today following  Treatment.    Patient will continue to benefit from skilled PT services to address functional mobility deficits, address ROM deficits, address strength deficits and instruct in home and community integration to attain remaining goals.       See Plan of Care    See progress note/recertification    See Discharge Summary         Progress towards goals / Updated goals:  1.?? Pt will be compliant and independent with HEP in order to facilitate PT sessions and aid with self management- met 01/22/15  2.?? Pt to tolerate 30 min or more of TE and/or Interventions w/o increased s/s - Met 01/14/15    Long Term Goals: To be accomplished in 10?? treatments:  1.?? Pt will report 50% improvement or better with involvement to show a significant increase in function  2.?? Pt will increase (B) grip to 50# to increase grip strength and prevent pt from dropping items.  3.?? Pt will ambulate 500 feet (I) w/o toe drag or deviations so that she can perform usual walking without falling - met 01/22/15??  4.?? Pt will report no falls while walking for a 2 week period for carry over to safe community ambulation ??   4.?? Pt will improve FOTO score to 49 to show significant improvement in function for progress to little difficulty with usual daily house duties- met 98 01/22/15      PLAN    Upgrade activities as tolerated       Continue plan of care    Update interventions per flow sheet         Discharge due to:_    Other:_  Trial  TM/ grip  strength  NV    Moises Terpstra, PTA 01/30/2015  9:41 AM

## 2015-02-09 ENCOUNTER — Encounter: Payer: PRIVATE HEALTH INSURANCE | Primary: Family Medicine

## 2015-02-11 ENCOUNTER — Ambulatory Visit: Attending: Orthopaedic Surgery | Primary: Family Medicine

## 2015-02-11 ENCOUNTER — Ambulatory Visit: Admit: 2015-02-11 | Discharge: 2015-02-11 | Payer: MEDICARE | Attending: Orthopaedic Surgery | Primary: Family Medicine

## 2015-02-11 ENCOUNTER — Encounter

## 2015-02-11 DIAGNOSIS — G8929 Other chronic pain: Secondary | ICD-10-CM

## 2015-02-11 DIAGNOSIS — M25561 Pain in right knee: Secondary | ICD-10-CM

## 2015-02-11 NOTE — Patient Instructions (Addendum)
The doctor has ordered some laboratory studies for you. [facility] or [facility] to have your lab tests conducted. If you wish to go to another facility that is okay. Please have the lab forward a copy of the results to our office.    The patient is instructed to follow up in 1 week. They were advised to contact us if their condition worsens.     Knee Arthritis: Care Instructions  Your Care Instructions  Knee arthritis is a breakdown of the cartilage that cushions your knee joint. When the cartilage wears down, your bones rub against each other. This causes pain and stiffness. Knee arthritis tends to get worse with time.  Treatment for knee arthritis involves reducing pain, making the leg muscles stronger, and staying at a healthy body weight. The treatment usually does not improve the health of the cartilage, but it can reduce pain and improve how well your knee works.  You can take simple measures to protect your knee joints, ease your pain, and help you stay active.  Follow-up care is a key part of your treatment and safety. Be sure to make and go to all appointments, and call your doctor if you are having problems. It's also a good idea to know your test results and keep a list of the medicines you take.  How can you care for yourself at home?  ?? Know that knee arthritis will cause more pain on some days than on others.  ?? Stay at a healthy weight. Lose weight if you are overweight. When you stand up, the pressure on your knees from every pound of body weight is multiplied four times. So if you lose 10 pounds, you will reduce the pressure on your knees by 40 pounds.  ?? Talk to your doctor or physical therapist about exercises that will help ease joint pain.  ?? Stretch to help prevent stiffness and to prevent injury before you exercise. You may enjoy gentle forms of yoga to help keep your knee joints and muscles flexible.  ?? Walk instead of jog.   ?? Ride a bike. This makes your thigh muscles stronger and takes pressure off your knee.  ?? Wear well-fitting and comfortable shoes.  ?? Exercise in chest-deep water. This can help you exercise longer with less pain.  ?? Avoid exercises that include squatting or kneeling. They can put a lot of strain on your knees.  ?? Talk to your doctor to make sure that the exercise you do is not making the arthritis worse.  ?? Do not sit for long periods of time. Try to walk once in a while to keep your knee from getting stiff.  ?? Ask your doctor or physical therapist whether shoe inserts may reduce your arthritis pain.  ?? If you can afford it, get new athletic shoes at least every year. This can help reduce the strain on your knees.  ?? Use a device to help you do everyday activities.  ?? A cane or walking stick can help you keep your balance when you walk. Hold the cane or walking stick in the hand opposite the painful knee.  ?? If you feel like you may fall when you walk, try using crutches or a front-wheeled walker. These can prevent falls that could cause more damage to your knee.  ?? A knee brace may help keep your knee stable and prevent pain.  ?? You also can use other things to make life easier, such  as a higher toilet seat and handrails in the bathtub or shower.  ?? Take pain medicines exactly as directed.  ?? Do not wait until you are in severe pain. You will get better results if you take it sooner.  ?? If you are not taking a prescription pain medicine, take an over-the-counter medicine such as acetaminophen (Tylenol), ibuprofen (Advil, Motrin), or naproxen (Aleve). Read and follow all instructions on the label.  ?? Do not take two or more pain medicines at the same time unless the doctor told you to. Many pain medicines have acetaminophen, which is Tylenol. Too much acetaminophen (Tylenol) can be harmful.  ?? Tell your doctor if you take a blood thinner, have diabetes, or have allergies to shellfish.   ?? Ask your doctor if you might benefit from a shot of steroid medicine into your knee. This may provide pain relief for several months.  ?? Many people take the supplements glucosamine and chondroitin for osteoarthritis. Some people feel they help, but the medical research does not show that they work. Talk to your doctor before you take these supplements.  When should you call for help?  Call your doctor now or seek immediate medical care if:  ?? You have sudden swelling, warmth, or pain in your knee.  ?? You have knee pain and a fever or rash.  ?? You have such bad pain that you cannot use your knee.  Watch closely for changes in your health, and be sure to contact your doctor if you have any problems.   Where can you learn more?   Go to [email or web address]  Enter W187 in the search box to learn more about "Knee Arthritis: Care Instructions."   ?? 2006-2015 [employer]. Care instructions adapted under license by [employer] (which disclaims liability or warranty for this information). This care instruction is for use with your licensed healthcare professional. If you have questions about a medical condition or this instruction, always ask your healthcare professional. Healthwise, Incorporated disclaims any warranty or liability for your use of this information.  Content Version: 10.7.482551; Current as of: July 25, 2014

## 2015-02-11 NOTE — Progress Notes (Signed)
Patient: Cynthia Marsh                MRN: [id number]       SSN: Rosalita [id number]  Date of Birth: 10/18/1951        AGE: 64 y.o.        SEX: female  Body mass index is 27.79 kg/(m^2).    PCP: None  02/11/2015    Tomma LightningFrankie is a pleasant lady who is on disability for her back. She has severe arthritis involving the right knee, lateral and patellofemoral. [place] did not really help her. She has had an injection and a couple aspirations.  She still has persistent pain and swelling. She can walk for about 25 minutes on a good day.  Other days it is less. It can be down to 10 or 15.  Stairs, kneeling, and getting up and down from a chair can all be uncomfortable for her. She denies fevers, chills, night sweats, or weight loss.     She does have a history of asthma and known neuropathy as well.  She has a history of kidney stones, pneumonia, nervousness, depression, easy bleeding, and easy bruising.      She has had back surgeries.      Family history is positive for arthritis. Her sister had a knee replacement as well.    She does use some tobacco and alcohol. She is in [employer].      EXAMINATION:  She walks with a mildly antalgic gait owing to the affected extremity.  She has touch of a movie theatre sign when she first gets up and arises. She moves her head and neck adequately. There is no respiratory compromise or indrawing.  She has a touch of valgus when she ambulates.  She has good range of motion in the knee itself. The portals of entry are clean, dry, and well healed.  The calf is nontender.  There is mild evidence of neuropathy distally, although mainly sharp.  Tibialis anterior and EHL strength are 5/5.  The calf is nontender.  Straight leg raise is just equivocal today.     RADIOGRAPHS:  Review of radiographs including AP, tunnel, lateral, and skyline especially on the tunnel view reveal bone-on-bone contact in the  lateral compartment. The patellofemoral also has significant degenerative changes.    IMPRESSION/PLAN: Fairly severe arthritis right knee.     PLAN:  I would like to order some infectious labs. She had a couple of aspirations, and I do not know if these were sent. I am going to get some infectious labs to rule out infection. Assuming this is negative she can have a Cortisone injection every three months.  Certainly if that is not efficacious for her I would be very happy to replace her knee. It is really up to her as to the timing of surgery, although we usually do recommend maximizing her nonoperative management.  I will see her back to review the results of her blood work in the not too distant future.            REVIEW OF SYSTEMS:      CON: negative for weight loss, fever  EYE: negative for double vision  ENT: negative for hoarseness  RS:   negative for Tb  GI:    negative for blood in stool  GU:  negative for blood in urine  Other systems reviewed and noted below.  Past Medical History   Diagnosis Date   ??? Asthma    ??? Chronic kidney disease    ??? Diabetes (HCC)    ??? Hypertension    ??? Osteoporosis        History reviewed. No pertinent family history.    Current Outpatient Prescriptions   Medication Sig Dispense Refill   ??? ALPRAZolam (XANAX) 0.5 mg tablet   2   ??? busPIRone (BUSPAR) 15 mg tablet   4   ??? oxybutynin chloride XL (DITROPAN XL) 10 mg CR tablet   4   ??? QVAR 80 mcg/actuation inhaler   11   ??? HYDROcodone-acetaminophen (NORCO) 10-325 mg tablet   0   ??? zolpidem (AMBIEN) 10 mg tablet   3   ??? amLODIPine (NORVASC) 10 mg tablet   4   ??? azelastine (ASTELIN) 137 mcg (0.1 %) nasal spray   4   ??? FLUoxetine (PROZAC) 40 mg capsule   4   ??? hydrochlorothiazide (HYDRODIURIL) 25 mg tablet   4   ??? ramipril (ALTACE) 5 mg capsule   4   ??? PROLENSA 0.07 % drop   3   ??? montelukast (SINGULAIR) 10 mg tablet   3   ??? predniSONE (DELTASONE) 10 mg tablet   0       Allergies   Allergen Reactions   ??? Castor Oil Cough    ??? Crestor [Rosuvastatin] Unknown (comments)     Heart beats fast       Past Surgical History   Procedure Laterality Date   ??? Pr anesth,surgery of shoulder     ??? Pr hand/finger surgery unlisted     ??? Hx wrist fracture tx     ??? Pr laminectomy,cervical     ??? Hx back surgery         History     Social History   ??? Marital Status: SINGLE     Spouse Name: N/A   ??? Number of Children: N/A   ??? Years of Education: N/A     Occupational History   ??? Not on file.     Social History Main Topics   ??? Smoking status: Light Tobacco Smoker   ??? Smokeless tobacco: Never Used   ??? Alcohol Use: Not on file   ??? Drug Use: Not on file   ??? Sexual Activity: Not on file     Other Topics Concern   ??? Not on file     Social History Narrative   ??? No narrative on file       BP 115/79 mmHg   Pulse 70   Temp(Src) 95.8 ??F (35.4 ??C)   Ht  (1.626 m)   Wt 73.483 kg (162 lb)   BMI 27.79 kg/m2      PHYSICAL EXAMINATION:  GENERAL: Alert and oriented x3, in no acute distress, well-developed, well-nourished, afebrile.  HEART: No JVD.  EYES: No scleral icterus   NECK: No significant lymphadenopathy   LUNGS: No respiratory compromise or indrawing  ABDOMEN: Soft, non-tender, non-distended.           Electronically signed by: Gifford Shave, MD

## 2015-02-11 NOTE — Progress Notes (Signed)
Patient is here complaining of right knee pain.  She had a knee scope in June 2015.  She states she has not had any relief since the surgery.  She has had 2 aspirations and cortisone injections since the surgery without any relief.  Verbal order given by Dr. Meyer CoryMarlow for xray of the right knee

## 2015-02-11 NOTE — Progress Notes (Signed)
Chief Complaint   Patient presents with   ??? Knee Pain     right nx

## 2015-02-13 ENCOUNTER — Inpatient Hospital Stay: Admit: 2015-02-13 | Primary: Family Medicine

## 2015-02-13 ENCOUNTER — Inpatient Hospital Stay: Admit: 2015-02-13 | Payer: PRIVATE HEALTH INSURANCE | Primary: Family Medicine

## 2015-02-13 DIAGNOSIS — M6281 Muscle weakness (generalized): Secondary | ICD-10-CM

## 2015-02-13 DIAGNOSIS — G8929 Other chronic pain: Secondary | ICD-10-CM | POA: Diagnosis not present

## 2015-02-13 DIAGNOSIS — M25561 Pain in right knee: Secondary | ICD-10-CM | POA: Diagnosis not present

## 2015-02-13 NOTE — Progress Notes (Signed)
PT DAILY TREATMENT NOTE - MCR 3-16    Patient Name: Cynthia Marsh  Date:02/13/2015  DOB: Apr 30, 1951    Patient DOB Verified  Payor: [place] / Plan: [place] / Product Type: Managed Care Medicare /    In time:10:14  Out time:11:04  Total Treatment Time (min): 51  Total Timed Codes (min):   1:1 Treatment Time ([place] only):   Visit #: 9 of10    Treatment Area: Muscle weakness (generalized) [M62.81]    SUBJECTIVE  Pain Level (0-10 scale):8  Any medication changes, allergies to medications, adverse drug reactions, diagnosis change, or new procedure performed?:  No     Yes (see summary sheet for update)  Subjective functional status/changes:    No changes reported  Pain  At  (B)  Legs.    OBJECTIVE    Modality rationale: decrease edema, decrease inflammation, decrease pain and increase tissue extensibility to improve the patient???s ability to perform ADL   Min Type Additional Details     Estim:  Unatt       IFC  Premod                        Other:  w/ice   w/heat  Position:  Location:     Estim: Att    TENS instruct  NMES                    Other:  w/US   w/ice   w/heat  Position:  Location:      Traction:  Cervical       Lumbar                        Prone          Supine                       Intermittent   Continuous Lbs:   before manual   after manual      Ultrasound: Continuous    Pulsed                           1MHz   3MHz Location:  W/cm2:      Iontophoresis with dexamethasone         Location:  Take home patch    In clinic   10   Ice       heat post    Ice massage    Laser     Anodyne Position:supine  Location:(B)  HS      Laser with stim    Other: Position:  Location:      Vasopneumatic Device Pressure:        lo  med  hi   Temperature:  lo  med  hi    Skin assessment post-treatment:  intact redness- no adverse reaction    redness ??? adverse reaction:      min Eval                  Re-Eval       41 min Therapeutic Exercise:   See flow sheet :    Rationale: increase ROM and increase strength to improve the patient???s ability to perform ADL     min Therapeutic Activity:    See flow sheet :   Rationale:   to improve the  patient???s ability to       min Neuromuscular Re-education:    See flow sheet :   Rationale:   to improve the patient???s ability to      min Manual Therapy:     Rationale:  to      min Gait Training:  ___ feet with ___ device on level surfaces with ___ level of assist   Rationale:          With    TE    TA    neuro    other: Patient Education:  Review HEP     Progressed/Changed HEP based on:    positioning    body mechanics    transfers    heat/ice application     other:      Other Objective/Functional Measures: Soreness  (B)  HS    Pain Level (0-10 scale) post treatment: <8  (B)  LE    ASSESSMENT/Changes in Function: pt  Reports  She is  [place] trying  To put on her pants.  Held  Standing there x.  Overall fair response  To each there ex.    Patient will continue to benefit from skilled PT services to address functional mobility deficits, address ROM deficits, address strength deficits and instruct in home and community integration to attain remaining goals.       See Plan of Care    See progress note/recertification    See Discharge Summary         Progress towards goals / Updated goals:  Progress towards goals / Updated goals:  1.?? Pt will be compliant and independent with HEP in order to facilitate PT sessions and aid with self management- met 01/22/15  2.?? Pt to tolerate 30 min or more of TE and/or Interventions w/o increased s/s - Met 01/14/15    Long Term Goals: To be accomplished in 10?? treatments:  1.?? Pt will report 50% improvement or better with involvement to show a significant increase in function  2.?? Pt will increase (B) grip to 50# to increase grip strength and prevent pt from dropping items.  3.?? Pt will ambulate 500 feet (I) w/o toe drag or deviations so that she can perform usual walking without falling - met 01/22/15??   4.?? Pt will report no falls while walking for a 2 week period for carry over to safe community ambulation ??  4.?? Pt will improve FOTO score to 49 to show significant improvement in function for progress to little difficulty with usual daily house duties- met 98 01/22/15      PLAN    Upgrade activities as tolerated       Continue plan of care    Update interventions per flow sheet         Discharge due to:_    Other:_  REASSESS  GOALS  [place]    [place], PTA 02/13/2015  10:54 AM

## 2015-02-18 ENCOUNTER — Ambulatory Visit: Admit: 2015-02-18 | Discharge: 2015-02-18 | Payer: MEDICARE | Attending: Orthopaedic Surgery | Primary: Family Medicine

## 2015-02-18 DIAGNOSIS — M25561 Pain in right knee: Secondary | ICD-10-CM

## 2015-02-18 MED ORDER — BETAMETHASONE ACET & SOD PHOS 6 MG/ML SUSP FOR INJECTION
6 mg/mL | Freq: Once | INTRAMUSCULAR | Status: AC
Start: 2015-02-18 — End: 2015-02-18

## 2015-02-18 NOTE — Progress Notes (Signed)
Chief Complaint   Patient presents with   ??? Knee Pain     right

## 2015-02-18 NOTE — Progress Notes (Signed)
Patient: Cynthia Marsh                MRN: [id number]       SSN: [id number]  Date of Birth: 1950/12/25        AGE: 64 y.o.        SEX: female  Body mass index is 27.79 kg/(m^2).    PCP: None  02/18/2015    I saw Cynthia Marsh in followup.  She has had a couple of scopes, and we sent her for an infectious workup, which was negative.  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  I would recommend nonoperative measures.  Apparently, she is going to be moving back home, and she was like to have a cortisone injection today.  The pain is moderate and aching.  Again, the interleukin six, CBC, sed rate, C-reactive protein are all within normal limits.      PHYSICAL EXAMINATION:  On examination today, her scope portals are clean and dry.  There is no evidence for infection.  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  However, having said this, there is certainly no evidence for infection or DVT, good motion, mild quads atrophy but not severely so, VMO???s.  the hips rotate nicely.  The calf is nontender.  Homan's sign is negative.     PROCEDURE:  Under aseptic conditions and after informed, written consent with a time out, the right knee was injected with 1 cc of the Celestone preparation, i.e. 6 mg, which was well tolerated.    PLAN:  She is going to return to see [place] in about one month???s time for followup assessment.  I would recommend maximization of nonoperative measures.           REVIEW OF SYSTEMS:      CON: negative for weight loss, fever  EYE: negative for double vision  ENT: negative for hoarseness  RS:   negative for Tb  GI:    negative for blood in stool  GU:  negative for blood in urine  Other systems reviewed and noted below.          Past Medical History   Diagnosis Date   ??? Asthma    ??? Chronic kidney disease    ??? Diabetes (HCC)    ??? Hypertension    ??? Osteoporosis        History reviewed. No pertinent family history.     Current Outpatient Prescriptions   Medication Sig Dispense Refill   ??? betamethasone (CELESTONE SOLUSPAN) 6 mg/mL injection 1 mL by IntraMUSCular route once for 1 dose. 1 mL 0   ??? ALPRAZolam (XANAX) 0.5 mg tablet   2   ??? busPIRone (BUSPAR) 15 mg tablet   4   ??? oxybutynin chloride XL (DITROPAN XL) 10 mg CR tablet   4   ??? QVAR 80 mcg/actuation inhaler   11   ??? HYDROcodone-acetaminophen (NORCO) 10-325 mg tablet   0   ??? zolpidem (AMBIEN) 10 mg tablet   3   ??? amLODIPine (NORVASC) 10 mg tablet   4   ??? azelastine (ASTELIN) 137 mcg (0.1 %) nasal spray   4   ??? FLUoxetine (PROZAC) 40 mg capsule   4   ??? hydrochlorothiazide (HYDRODIURIL) 25 mg tablet   4   ??? ramipril (ALTACE) 5 mg capsule   4   ??? PROLENSA 0.07 % drop   3   ??? montelukast (SINGULAIR)  10 mg tablet   3   ??? predniSONE (DELTASONE) 10 mg tablet   0       Allergies   Allergen Reactions   ??? Castor Oil Cough   ??? Crestor [Rosuvastatin] Unknown (comments)     Heart beats fast       Past Surgical History   Procedure Laterality Date   ??? Pr anesth,surgery of shoulder     ??? Pr hand/finger surgery unlisted     ??? Hx wrist fracture tx     ??? Pr laminectomy,cervical     ??? Hx back surgery         History     Social History   ??? Marital Status: SINGLE     Spouse Name: N/A   ??? Number of Children: N/A   ??? Years of Education: N/A     Occupational History   ??? Not on file.     Social History Main Topics   ??? Smoking status: Light Tobacco Smoker   ??? Smokeless tobacco: Never Used   ??? Alcohol Use: Not on file   ??? Drug Use: Not on file   ??? Sexual Activity: Not on file     Other Topics Concern   ??? Not on file     Social History Narrative       BP 142/123 mmHg   Pulse 84   Temp(Src) 97.4 ??F (36.3 ??C)   Ht 5\' 4"  (1.626 m)   Wt 73.483 kg (162 lb)   BMI 27.79 kg/m2      PHYSICAL EXAMINATION:  GENERAL: Alert and oriented x3, in no acute distress, well-developed, well-nourished, afebrile.  HEART: No JVD.  EYES: No scleral icterus   NECK: No significant lymphadenopathy    LUNGS: No respiratory compromise or indrawing  ABDOMEN: Soft, non-tender, non-distended.           Electronically signed by: Cynthia ShaveAARON L Jayleana Colberg, MD

## 2015-02-18 NOTE — Progress Notes (Signed)
Verbal order given by Dr. Meyer CoryMarlow for cortisone injection into the right knee

## 2015-02-18 NOTE — Patient Instructions (Signed)
Mild to moderate right knee arthritis  Discussed cortisone injections and Euflexxa injections right knee

## 2015-02-20 ENCOUNTER — Inpatient Hospital Stay: Admit: 2015-02-20 | Payer: PRIVATE HEALTH INSURANCE | Primary: Family Medicine

## 2015-02-20 NOTE — Progress Notes (Addendum)
PT DAILY TREATMENT NOTE - MCR 3-16    Patient Name: Cynthia Marsh  Date:02/20/2015  DOB: 1951-05-18    Patient DOB Verified  Payor: [place] / Plan: [place] / Product Type: Managed Care Medicare /    In time:2:44  Out time:3:53  Total Treatment Time (min): 35  Total Timed Codes (min): 35  1:1 Treatment Time ([place] on): 35  Visit #: 10 of 10    Treatment Area: Muscle weakness (generalized) [M62.81]    SUBJECTIVE  Pain Level (0-10 scale): 0  Any medication changes, allergies to medications, adverse drug reactions, diagnosis change, or new procedure performed?:  No     Yes (see summary sheet for update)  Subjective functional status/changes:    No changes reported  Had   Cortisone  Shot yesterday   (R)  Knee.    OBJECTIVE    Modality rationale: decrease edema, decrease inflammation, decrease pain and increase tissue extensibility to improve the patient???s ability to perform ADL   Min Type Additional Details     Estim:  Unatt       IFC  Premod                        Other:  w/ice   w/heat  Position:  Location:     Estim: Att    TENS instruct  NMES                    Other:  w/US   w/ice   w/heat  Position:  Location:      Traction:  Cervical       Lumbar                        Prone          Supine                       Intermittent   Continuous Lbs:   before manual   after manual      Ultrasound: Continuous    Pulsed                           1MHz   3MHz Location:  W/cm2:      Iontophoresis with dexamethasone         Location:  Take home patch    In clinic      Ice       heat    Ice massage    Laser     Anodyne Position:  Location:      Laser with stim    Other: Position:  Location:      Vasopneumatic Device Pressure:        lo  med  hi   Temperature:  lo  med  hi    Skin assessment post-treatment:  intact redness- no adverse reaction    redness ??? adverse reaction:      min Eval                  Re-Eval       30 min Therapeutic Exercise:   See flow sheet :    Rationale: increase ROM, increase strength and improve coordination to improve the patient???s ability to perform ADL     5 min Therapeutic Activity:    See flow sheet :  Rationale: increase ROM and increase strength  to improve the patient???s ability to perform ADL      min Neuromuscular Re-education:    See flow sheet :   Rationale:   to improve the patient???s ability to      min Manual Therapy:     Rationale:  to      min Gait Training:  ___ feet with ___ device on level surfaces with ___ level of assist   Rationale:          With    TE    TA    neuro    other: Patient Education:  Review HEP     Progressed/Changed HEP based on:    positioning    body mechanics    transfers    heat/ice application     other: REASSESS GOALS/DC PROCEDURe     Other Objective/Functional Measures: FOTO:  84  (R) Grip    42.5, 38.6, 37.1    avg  39#   (L) 47.8,37.1, 36   40#    Pain Level (0-10 scale) post treatment: 0  ASSESSMENT/Changes in Function: Mrs.  Marsh  Reports  90%  Improvement. Also states  She can  Open  A jar  And  [employer]    Patient will continue to benefit from skilled PT services to address functional mobility deficits, address ROM deficits, address strength deficits and instruct in home and community integration to attain remaining goals.       See Plan of Care    See progress note/recertification    See Discharge Summary         Progress towards goals / Updated goals:  Progress towards goals / Updated goals:  1.?? Pt will be compliant and independent with HEP in order to facilitate PT sessions and aid with self management- met 01/22/15  2.?? Pt to tolerate 30 min or more of TE and/or Interventions w/o increased s/s - Met 01/14/15    Long Term Goals: To be accomplished in 10?? treatments:  1.?? Pt will report 50% improvement or better with involvement to show a significant increase in function  Met  02/20/15  2.?? Pt will increase (B) grip to 50# to increase grip strength and prevent  pt from dropping items.  Not met (R) Grip    42.5, 38.6, 37.1    avg  39#   (L) 47.8,37.1, 36   40#    3.?? Pt will ambulate 500 feet (I) w/o toe drag or deviations so that she can perform usual walking without falling - met 01/22/15??  4.?? Pt will report no falls while walking for a 2 week period for carry over to safe community ambulation ??  Not  Met 02/20/15  4.?? Pt will improve FOTO score to 49 to show significant improvement in function for progress to little difficulty with usual daily house duties- met 98 01/22/15   84  02/20/15      PLAN    Upgrade activities as tolerated       Continue plan of care    Update interventions per flow sheet         Discharge due to:_    Other:_FAX   DC NOTe      Denham Mose, PTA 02/20/2015  3:34 PM

## 2015-02-20 NOTE — Progress Notes (Signed)
[facility] ??? [facility]  [address] [place] [address]  [phone] fax    Discharge Summary    Patient name: Cynthia Marsh?? Start of Care: 01/07/2015??   Referral source: Shawn Stall, MD?? DOB: 1951/05/12 ??   Medical Diagnosis: Muscle weakness (generalized) [M62.81] Onset Date: About a year ago ??   Treatment Diagnosis: Gait, UE weakness??   Prior Hospitalization: see medical history?? Provider#: [id number]??   Medications: Verified on Patient summary List??   Comorbidities: Depression, Osteoporosis, DM, OA, HTN, Asthma, Visual Impaired  Prior Level of Function: Mod (I) with walking with SPC, able to use hands to do usual house work    Visits from Start of Care: 10    Missed Visits: 0  Reporting Period : 01/06/15 to 2015/03/12      Summary of Care:  Progress towards goals / Updated goals:Patient has shown good progress with this treatment program. Pain as of last visit was 0/10. Patient has shown decreased pain and increased strength and mobility.  Patient reports 90% improvement with overall involvement.  FOTO score is 84, an improvement of 35% since start of treatment.  All STG/LTGs achieved as identified below.  Patient now reports that she is able to open a jar and can lock her house door     Fall Risk Assessment: Patient demonstrates a low Fall Risk   1.?? Pt will be compliant and independent with HEP in order to facilitate PT sessions and aid with self management- met 11-Feb-2015  2.?? Pt to tolerate 30 min or more of TE and/or Interventions w/o increased s/s - Met 01/14/15  ??  Long Term Goals: To be accomplished in 10?? treatments:  1.?? Pt will report 50% improvement or better with involvement to show a significant increase in function Met 2015-03-12  2.?? Pt will increase (B) grip to 50# to increase grip strength and prevent pt from dropping items. Not met (R) Grip 42.5, 38.6, 37.1  avg 39# (L) 47.8,37.1, 36 40#  ??   3.?? Pt will ambulate 500 feet (I) w/o toe drag or deviations so that she can perform usual walking without falling - met 02-11-2015??  4.?? Pt will report no falls while walking for a 2 week period for carry over to safe community ambulation ?? Not Met 2015/03/12  4.?? Pt will improve FOTO score to 49 to show significant improvement in function for progress to little difficulty with usual daily house duties- met 98 02-11-2015 84 Mar 12, 2015  G-Codes (GP)  Mobility  L0761 Current  CI= 1-19% G8980 D/C  CI= 1-19%      The severity rating is based on clinical judgment and the FOTO score.      ASSESSMENT/RECOMMENDATIONS:  Discontinue therapy progressing towards or have reached established goals  Discontinue therapy due to lack of appreciable progress towards goals  Discontinue therapy due to lack of attendance or compliance  Other:    Thank you for this referral.     Theotis Barrio, PT 08/02/2015 10:57 AM

## 2015-03-05 ENCOUNTER — Encounter (HOSPITAL_COMMUNITY): Payer: Self-pay | Admitting: *Deleted

## 2015-03-05 ENCOUNTER — Emergency Department (HOSPITAL_COMMUNITY)
Admission: EM | Admit: 2015-03-05 | Discharge: 2015-03-05 | Disposition: A | Discharge status: Home / Self Care | Payer: Medicare (Managed Care) | Attending: Emergency Medicine | Admitting: Emergency Medicine

## 2015-03-05 DIAGNOSIS — J45909 Unspecified asthma, uncomplicated: Secondary | ICD-10-CM | POA: Diagnosis not present

## 2015-03-05 DIAGNOSIS — Z8701 Personal history of pneumonia (recurrent): Secondary | ICD-10-CM | POA: Diagnosis not present

## 2015-03-05 DIAGNOSIS — Z79899 Other long term (current) drug therapy: Secondary | ICD-10-CM | POA: Diagnosis not present

## 2015-03-05 DIAGNOSIS — M25461 Effusion, right knee: Secondary | ICD-10-CM | POA: Insufficient documentation

## 2015-03-05 DIAGNOSIS — F419 Anxiety disorder, unspecified: Secondary | ICD-10-CM | POA: Insufficient documentation

## 2015-03-05 DIAGNOSIS — N189 Chronic kidney disease, unspecified: Secondary | ICD-10-CM | POA: Diagnosis not present

## 2015-03-05 DIAGNOSIS — Z7951 Long term (current) use of inhaled steroids: Secondary | ICD-10-CM | POA: Insufficient documentation

## 2015-03-05 DIAGNOSIS — E119 Type 2 diabetes mellitus without complications: Secondary | ICD-10-CM | POA: Insufficient documentation

## 2015-03-05 DIAGNOSIS — I129 Hypertensive chronic kidney disease with stage 1 through stage 4 chronic kidney disease, or unspecified chronic kidney disease: Secondary | ICD-10-CM | POA: Diagnosis not present

## 2015-03-05 DIAGNOSIS — M25561 Pain in right knee: Secondary | ICD-10-CM | POA: Diagnosis present

## 2015-03-05 LAB — SYNOVIAL CELL COUNT + DIFF, W/ CRYSTALS
CRYSTALS FLUID: NONE SEEN
Eosinophils-Synovial: NONE SEEN % (ref 0–1)
Lymphocytes-Synovial Fld: 44 % — ABNORMAL HIGH (ref 0–20)
MONOCYTE-MACROPHAGE-SYNOVIAL FLUID: 32 % — AB (ref 50–90)
Neutrophil, Synovial: 24 % (ref 0–25)
WBC, Synovial: 53 /mm3 (ref 0–200)

## 2015-03-05 MED ORDER — NAPROXEN 500 MG PO TABS: 500.0000 mg | ORAL_TABLET | Freq: Two times a day (BID) | ORAL | Status: DC

## 2015-03-05 MED ORDER — LIDOCAINE HCL (PF) 1 % IJ SOLN
30.0000 mL | Freq: Once | INTRAMUSCULAR | Status: AC
  Administered 2015-03-05: 30 mL
  Filled 2015-03-05: qty 30

## 2015-03-05 NOTE — Discharge Instructions (Signed)
Take naproxen for knee pain and swelling. Rest, ice and elevate your knee.  Knee Effusion The medical term for having fluid in your knee is effusion. This is often due to an internal derangement of the knee. This means something is wrong inside the knee. Some of the causes of fluid in the knee may be torn cartilage, a torn ligament, or bleeding into the joint from an injury. Your knee is likely more difficult to bend and move. This is often because there is increased pain and pressure in the joint. The time it takes for recovery from a knee effusion depends on different factors, including:   Type of injury.  Your age.  Physical and medical conditions.  Rehabilitation Strategies. How long you will be away from your normal activities will depend on what kind of knee problem you have and how much damage is present. Your knee has two types of cartilage. Articular cartilage covers the bone ends and lets your knee bend and move smoothly. Two menisci, thick pads of cartilage that form a rim inside the joint, help absorb shock and stabilize your knee. Ligaments bind the bones together and support your knee joint. Muscles move the joint, help support your knee, and take stress off the joint itself. CAUSES  Often an effusion in the knee is caused by an injury to one of the menisci. This is often a tear in the cartilage. Recovery after a meniscus injury depends on how much meniscus is damaged and whether you have damaged other knee tissue. Small tears may heal on their own with conservative treatment. Conservative means rest, limited weight bearing activity and muscle strengthening exercises. Your recovery may take up to 6 weeks.  TREATMENT  Larger tears may require surgery. Meniscus injuries may be treated during arthroscopy. Arthroscopy is a procedure in which your surgeon uses a small telescope like instrument to look in your knee. Your caregiver can make a more accurate diagnosis (learning what is wrong) by  performing an arthroscopic procedure. If your injury is on the inner margin of the meniscus, your surgeon may trim the meniscus back to a smooth rim. In other cases your surgeon will try to repair a damaged meniscus with stitches (sutures). This may make rehabilitation take longer, but may provide better long term result by helping your knee keep its shock absorption capabilities. Ligaments which are completely torn usually require surgery for repair. HOME CARE INSTRUCTIONS  Use crutches as instructed.  If a brace is applied, use as directed.  Once you are home, an ice pack applied to your swollen knee may help with discomfort and help decrease swelling.  Keep your knee raised (elevated) when you are not up and around or on crutches.  Only take over-the-counter or prescription medicines for pain, discomfort, or fever as directed by your caregiver.  Your caregivers will help with instructions for rehabilitation of your knee. This often includes strengthening exercises.  You may resume a normal diet and activities as directed. SEEK MEDICAL CARE IF:   There is increased swelling in your knee.  You notice redness, swelling, or increasing pain in your knee.  An unexplained oral temperature above 102 F (38.9 C) develops. SEEK IMMEDIATE MEDICAL CARE IF:   You develop a rash.  You have difficulty breathing.  You have any allergic reactions from medications you may have been given.  There is severe pain with any motion of the knee. MAKE SURE YOU:   Understand these instructions.  Will watch your condition.  Will get help right away if you are not doing well or get worse. Document Released: 02/11/2004 Document Revised: 02/13/2012 Document Reviewed: 04/16/2008 [facility] Patient Information 2015 [place], [place]. This information is not intended to replace advice given to you by your health care provider. Make sure you discuss any questions you have with your health care  provider.  Knee Arthrocentesis Arthrocentesis is a procedure for removing fluid from a joint. The procedure is used to remove uncomfortable amounts of fluid from a joint or to get a sample of joint fluid for testing. Testing joint fluid can help your health care provider figure out the cause of the pain or swelling you are having in your joint. Infection or gout, among other conditions, can cause fluid to form in joints, resulting in pain or swelling.  LET YOUR CAREGIVER KNOW ABOUT:   Allergies.  Medications taken including herbs, eye drops, over the counter medications, and creams.  Use of steroids (by mouth or creams).  Previous problems with anesthetics or Novocaine.  History of blood clots (thrombophlebitis).  History of bleeding or blood problems.  Previous surgery.  Other health problems. RISKS AND COMPLICATIONS  A local anesthetic may not numb the area well enough and you may feel some minor discomfort. In rare cases, you may have an allergic reaction to the drug used to numb the skin.  More fluid may form in the joint.  You may develop infection or bleeding. BEFORE THE PROCEDURE  Wash all of the skin around the entire knee area. Try to remove any loose, scaling skin. There is no other specific preparation necessary unless advised otherwise by your caregiver. AFTER THE PROCEDURE   You can go home after the procedure.  You may need to put ice on the joint 15-20 minutes, 03-04 times a day until pain goes away.  You may need to put an elastic bandage on the joint. HOME CARE INSTRUCTIONS   Only take over-the-counter or prescription medicines for pain, discomfort, or fever as directed by your caregiver.  You should avoid stressing the joint. Unless advised otherwise, this includes jogging, bicycling, recreational climbing, hiking, and other activities that would put a lot of pressure on a knee joint.  Laying down and elevating the leg/knee above the level of your heart  can help to minimize return of swelling. SEEK MEDICAL CARE IF:   You have repeated or worsening swelling.  There is drainage from the puncture area.  You develop red streaking that extends above or below the site where the needle had been placed. SEEK IMMEDIATE MEDICAL CARE IF:   You develop a fever.  You have pain that gets worse even though you are taking pain medicine.  The area is red and warm and you have trouble moving the joint. MAKE SURE YOU:   Understand these instructions.  Will watch your condition.  Will get help right away if you are not doing well or get worse. Document Released: 02/09/2007 Document Revised: 02/13/2012 Document Reviewed: 11/05/2007 [facility] Patient Information 2015 [place], [place]. This information is not intended to replace advice given to you by your health care provider. Make sure you discuss any questions you have with your health care provider.

## 2015-03-05 NOTE — ED Provider Notes (Signed)
CSN: [id number]     Arrival date & time 03/05/15  1654 History   First MD Initiated Contact with Patient 03/05/15 1728     Chief Complaint  Patient presents with  . Knee Pain     (Consider location/radiation/quality/duration/timing/severity/associated sxs/prior Treatment) HPI Comments: 64 year old female presenting with right knee pain for the past 2 years. Reports she had a torn meniscus, had surgery September 2015 in [place]. She has been followed by orthopedics and her PCP in [place] since then, however recently moved to [place] and does not have a doctor in this area. One week ago, and on 3/9, she had "water" drawn from her knee for pain relief, and a cortisone injection. Reports she was feeling better up until earlier today. States her knee started to swell up again, pain worse with walking. She has not tried any other alleviating factors.  FROM.  Denies fever, skin color change or warmth. No new injury.  Patient is a 64 y.o. female presenting with knee pain. The history is provided by the patient.  Knee Pain   Past Medical History  Diagnosis Date  . H/O echocardiogram     states she had echo in [facility]. a couple of yrs. ago  . Hypertension   . Anxiety   . Mental disorder   . Asthma   . Pneumonia     seen in ED at [facility]- for pneumonia, Jan./ 2013- treated & sent home   . Diabetes mellitus without complication   . Chronic kidney disease     renal calculi  . Headache(784.0)     sinus related   . Arthritis     DDD, spondylosis   Past Surgical History  Procedure Laterality Date  . Nasal sinus surgery    . Breast surgery      L cyst removed - 1972  . Carpal tunnel release      both hands   . Trigger finger release      L thumb  . Calf -r- cyst removed    . Shoulder arthroscopy      R shoulder- RCR  . Back surgery      2012- lumbar fusion  . Great toe      removed arthritis   . Bunionectomy      L foot  . Abdominal hysterectomy     History  reviewed. No pertinent family history. History  Substance Use Topics  . Smoking status: Never Smoker   . Smokeless tobacco: Not on file  . Alcohol Use: Yes     Comment: rare use   OB History    No data available     Review of Systems  Musculoskeletal: Positive for joint swelling and arthralgias.  All other systems reviewed and are negative.     Allergies  Crestor; Almond meal; and Cozaar  Home Medications   Prior to Admission medications   Medication Sig Start Date End Date Taking? Authorizing Provider  albuterol (PROVENTIL HFA;VENTOLIN HFA) 108 (90 BASE) MCG/ACT inhaler Inhale 2 puffs into the lungs every 4 (four) hours as needed. For wheezing    Historical Provider, MD  beclomethasone (QVAR) 80 MCG/ACT inhaler Inhale 1 puff into the lungs 2 (two) times daily.    Historical Provider, MD  dicyclomine (BENTYL) 20 MG tablet Take 10-20 mg by mouth every 6 (six) hours as needed. For abdominal cramping    Historical Provider, MD  diltiazem (TIAZAC) 360 MG 24 hr capsule Take 360 mg by mouth daily before breakfast.  Historical Provider, MD  EPINEPHrine (EPIPEN) 0.3 mg/0.3 mL DEVI Inject 0.3 mg into the muscle once as needed. For bee stings    Historical Provider, MD  FLUoxetine (PROZAC) 20 MG capsule Take 20 mg by mouth daily before breakfast.     Historical Provider, MD  hydrOXYzine (ATARAX/VISTARIL) 25 MG tablet Take 25-100 mg by mouth 3 (three) times daily as needed. Takes 1 tablet 3 times daily as needed and 4 tablets at bedtime as needed for sleep    Historical Provider, MD  metFORMIN (GLUCOPHAGE) 1000 MG tablet Take 1,000 mg by mouth 2 (two) times daily with a meal.    Historical Provider, MD  mirtazapine (REMERON) 30 MG tablet Take 30 mg by mouth at bedtime.    Historical Provider, MD  montelukast (SINGULAIR) 10 MG tablet Take 10 mg by mouth at bedtime.     Historical Provider, MD  naproxen (NAPROSYN) 500 MG tablet Take 1 tablet (500 mg total) by mouth 2 (two) times daily.  03/05/15   Sahara Fujimoto M Orel Cooler, PA-C  OVER THE COUNTER MEDICATION Take 1 tablet by mouth daily as needed. For allergies. Takes [employer] [facility]    Historical Provider, MD  oxyCODONE-acetaminophen (PERCOCET) 7.5-325 MG per tablet Take 1 tablet by mouth every 4 (four) hours as needed. For pain    Historical Provider, MD  ramipril (ALTACE) 5 MG capsule Take 5 mg by mouth daily before breakfast.     Historical Provider, MD  simvastatin (ZOCOR) 40 MG tablet Take 40 mg by mouth daily with breakfast.     Historical Provider, MD  tiZANidine (ZANAFLEX) 2 MG tablet Take 2 mg by mouth every 8 (eight) hours as needed. For spasms    Historical Provider, MD  traZODone (DESYREL) 150 MG tablet Take 300 mg by mouth at bedtime as needed. For sleep    Historical Provider, MD   BP 113/79 mmHg  Pulse 82  Temp(Src) 98.2 F (36.8 C) (Oral)  Resp 20  SpO2 97% Physical Exam  Constitutional: She is oriented to person, place, and time. She appears well-developed and well-nourished. No distress.  HENT:  Head: Normocephalic and atraumatic.  Mouth/Throat: Oropharynx is clear and moist.  Eyes: Conjunctivae and EOM are normal.  Neck: Normal range of motion. Neck supple.  Cardiovascular: Normal rate, regular rhythm and normal heart sounds.   Pulmonary/Chest: Effort normal and breath sounds normal. No respiratory distress.  Musculoskeletal: Normal range of motion.  R knee with swelling antero-laterally. No erythema or warmth. FROM.  Neurological: She is alert and oriented to person, place, and time. No sensory deficit.  Skin: Skin is warm and dry.  Psychiatric: She has a normal mood and affect. Her behavior is normal.  Nursing note and vitals reviewed.   ED Course  ARTHOCENTESIS Date/Time: 03/05/2015 5:59 PM Performed by: Kathrynn SpeedHESS, Zayanna Pundt M Authorized by: Kathrynn SpeedHESS, Dex Blakely M Consent: Verbal consent obtained. Written consent obtained. Risks and benefits: risks, benefits and alternatives were discussed Consent given by:  patient Patient understanding: patient states understanding of the procedure being performed Patient consent: the patient's understanding of the procedure matches consent given Procedure consent: procedure consent matches procedure scheduled Site marked: the operative site was marked Imaging studies: imaging studies not available Required items: required blood products, implants, devices, and special equipment available Patient identity confirmed: verbally with patient and arm band Time out: Immediately prior to procedure a "time out" was called to verify the correct patient, procedure, equipment, support staff and site/side marked as required. Indications: joint swelling and pain  Body area: knee Joint: right knee Local anesthesia used: yes Anesthesia: local infiltration Local anesthetic: lidocaine 1% without epinephrine Anesthetic total: 0.5 ml Patient sedated: no Preparation: Patient was prepped and draped in the usual sterile fashion. Needle gauge: 18 G Ultrasound guidance: no Approach: anterior Aspirate: blood-tinged and clear Aspirate amount: 10 mL Lidocaine 1% amount: 5 ml Patient tolerance: Patient tolerated the procedure well with no immediate complications   (including critical care time) Labs Review Labs Reviewed - No data to display  Imaging Review No results found.   EKG Interpretation None      MDM   Final diagnoses:  Knee effusion, right   Neurovascularly intact. No erythema, warmth or fever concerning for infection. 10 mL fluid aspirated from knee. Sent for analysis. Advised rest, ice, elevation, NSAIDs. Resources given for orthopedic follow-up. Stable for discharge. Return precautions given. Patient states understanding of treatment care plan and is agreeable.  Kathrynn Speed, PA-C 03/05/15 1810  Linwood Dibbles, MD 03/07/15 [phone]

## 2015-03-05 NOTE — ED Notes (Signed)
Pt states she was dx with a tore meniscus on her right knee in June. Pt's knee pain is not getting any better. Pt was seen by doctor 3/9 had steroid shot in knee. Pt reports increase in swelling and pain. Pt ambulatory to triage room with steady gait.

## 2015-04-13 DIAGNOSIS — F332 Major depressive disorder, recurrent severe without psychotic features: Secondary | ICD-10-CM | POA: Diagnosis not present

## 2015-04-20 ENCOUNTER — Ambulatory Visit
Admit: 2015-04-20 | Discharge: 2015-04-20 | Payer: PRIVATE HEALTH INSURANCE | Attending: Internal Medicine | Primary: Family Medicine

## 2015-04-20 ENCOUNTER — Inpatient Hospital Stay: Admit: 2015-04-20 | Payer: MEDICARE | Primary: Family Medicine

## 2015-04-20 DIAGNOSIS — I10 Essential (primary) hypertension: Secondary | ICD-10-CM

## 2015-04-20 DIAGNOSIS — M545 Low back pain: Secondary | ICD-10-CM

## 2015-04-20 LAB — LIPID PANEL
CHOL/HDL Ratio: 3.5 (ref 0–5.0)
Cholesterol, total: 209 MG/DL — ABNORMAL HIGH (ref <200)
HDL Cholesterol: 59 MG/DL (ref 40–60)
LDL, calculated: 131.2 MG/DL — ABNORMAL HIGH (ref 0–100)
Triglyceride: 94 MG/DL (ref <150)
VLDL, calculated: 18.8 MG/DL

## 2015-04-20 LAB — METABOLIC PANEL, COMPREHENSIVE
A-G Ratio: 1.3 (ref 0.8–1.7)
ALT (SGPT): 21 U/L (ref 13–56)
AST (SGOT): 12 U/L — ABNORMAL LOW (ref 15–37)
Albumin: 4.4 g/dL (ref 3.4–5.0)
Alk. phosphatase: 58 U/L (ref 45–117)
Anion gap: 10 mmol/L (ref 3.0–18)
BUN/Creatinine ratio: 23 — ABNORMAL HIGH (ref 12–20)
BUN: 17 MG/DL (ref 7.0–18)
Bilirubin, total: 0.5 MG/DL (ref 0.2–1.0)
CO2: 29 mmol/L (ref 21–32)
Calcium: 10 MG/DL (ref 8.5–10.1)
Chloride: 100 mmol/L (ref 100–108)
Creatinine: 0.73 MG/DL (ref 0.6–1.3)
GFR est AA: >60 mL/min/{1.73_m2} (ref >60)
GFR est non-AA: >60 mL/min/{1.73_m2} (ref >60)
Globulin: 3.5 g/dL (ref 2.0–4.0)
Glucose: 103 mg/dL — ABNORMAL HIGH (ref 74–99)
Potassium: 3.8 mmol/L (ref 3.5–5.5)
Protein, total: 7.9 g/dL (ref 6.4–8.2)
Sodium: 139 mmol/L (ref 136–145)

## 2015-04-20 LAB — HEMOGLOBIN A1C W/O EAG: Hemoglobin A1c: 6.5 % — ABNORMAL HIGH (ref 4.2–5.6)

## 2015-04-20 MED ORDER — HYDROCODONE-ACETAMINOPHEN 10 MG-325 MG TAB
10-325 mg | ORAL_TABLET | Freq: Three times a day (TID) | ORAL | Status: DC | PRN
Start: 2015-04-20 — End: 2015-05-18

## 2015-04-20 NOTE — Progress Notes (Signed)
Patient is in the office today to establish care.   Patient states she has lower back pain 7/10.    Do you have an Advance Directive no  Do you want more information : information given     1. Have you been to the ER, urgent care clinic since your last visit?  Hospitalized since your last visit?No    2. Have you seen or consulted any other health care providers outside of the [facility] since your last visit?  Include any pap smears or colon screening. No

## 2015-04-20 NOTE — Progress Notes (Signed)
Cynthia Marsh is a 64 y.o.  female and presents with Establish Care; Cough; Back Pain; Diabetes; Hypertension; Depression; Asthma; Other; and Knee Pain      SUBJECTIVE:    Hypertension  Patient is here for evaluation of hypertension.  Age at onset of elevated blood pressure:  20.  She indicates that she is feeling well and denies any symptoms referable to her elevated blood pressure. Specifically denies chest pain, palpitations, dyspnea, orthopnea, PND or peripheral edema. Current medication regimen is as listed   below. Patient has these side effects of medication: cough she is having may be related to Altace if not improving on its own. .Cardiovascular risk factors: dyslipidemia, diabetes mellitus Use of agents associated with hypertension: none History of renal disease: negative  History of flank trauma: negative    Diabetes Mellitus  Patient presents for evaluation of for follow up on diabetes.  Onset of symptoms was at age 60. She describes symptoms as none. Course to date has been waxing and waning but better overall. Patient denies polyuria and visual disturbances. Home sugars are running: BGs consistently in an acceptable range Previous visits for this problem: none. Evaluation to date has been see lab results. Treatment goals: not assessed Treatment to date has been continued metformin: somewhat effective.   Back Pain  Patient presents for presents evaluation of low back problems.  Symptoms have been present for 4 years and include pain in low Back (aching in character; 7/10 in severity). Initial inciting event: was in accident 4 years ago. Alleviating factors identifiable by patient are recumbency. Exacerbating factors identifiable by patient are standing, walking. Treatments so far initiated by patient: pt has been on Norco 10/325 1 tab TID which makes it possible for her to doe chores around the home and walk for exercise.  Previous lower back problems: none. Previous workup: was  seen in [facility] and had 2 surgeries. Has been in pain Clinics  . Previous treatments: as above.    Anxiety  Patient complains of evaluation of anxiety disorder, sleep disturbance.  She has the following anxiety symptoms: insomnia, racing thoughts, psychomotor agitation, feelings of losing control, difficulty concentrating. Onset of symptoms was approximately several months ago, unchanged since that time. She denies current suicidal and homicidal ideation. Family history significant for anxiety.Possible organic causes contributing are: none. Risk factors: negative life event pt has had family difficulties with one child asking her to leave her home Previous treatment includes buspar, Prozac, Xanax, which was started 2 months ago when pt was under more stress dealing with family and insurance which she says continues.  and no other therapies.  She complains of the following side effects from the treatment: none.    Pt uses Qvar for her asthma on a daily basis and therefore only rarely uses her albuterol.     Pt's recent cough of 2 weeks is most likely due to allergies.     Past Medical History   Diagnosis Date   ??? Asthma    ??? Chronic kidney disease    ??? Diabetes ([place])    ??? Hypertension    ??? Osteoporosis    ??? Depression    ??? Anxiety    ??? Insomnia          Current Outpatient Prescriptions   Medication Sig   ??? mometasone (NASONEX) 50 mcg/actuation nasal spray 2 Sprays daily.   ??? albuterol (PROVENTIL HFA, VENTOLIN HFA, PROAIR HFA) 90 mcg/actuation inhaler Take  by inhalation.   ??? mirtazapine (REMERON)  30 mg tablet Take  by mouth nightly.   ??? calcium citrate-vitamin d3 (CITRACAL+D) 315-200 mg-unit tab Take 1 Tab by mouth daily (with breakfast).   ??? metFORMIN (GLUCOPHAGE) 1,000 mg tablet Take 1,000 mg by mouth two (2) times daily (with meals).   ??? albuterol-ipratropium (DUO-NEB) 2.5 mg-0.5 mg/3 ml nebu 3 mL by Nebulization route.   ??? HYDROcodone-acetaminophen (NORCO) 10-325 mg tablet Take 1 Tab by mouth  every eight (8) hours as needed for Pain. Max Daily Amount: 3 Tabs.   ??? ALPRAZolam (XANAX) 0.5 mg tablet    ??? busPIRone (BUSPAR) 15 mg tablet    ??? QVAR 80 mcg/actuation inhaler    ??? zolpidem (AMBIEN) 10 mg tablet    ??? amLODIPine (NORVASC) 10 mg tablet    ??? azelastine (ASTELIN) 137 mcg (0.1 %) nasal spray    ??? FLUoxetine (PROZAC) 40 mg capsule    ??? hydrochlorothiazide (HYDRODIURIL) 25 mg tablet    ??? ramipril (ALTACE) 5 mg capsule    ??? montelukast (SINGULAIR) 10 mg tablet    ??? oxybutynin chloride XL (DITROPAN XL) 10 mg CR tablet      No current facility-administered medications for this visit.     History     Social History   ??? Marital Status: SINGLE     Spouse Name: N/A   ??? Number of Children: N/A   ??? Years of Education: N/A     Occupational History   ??? Not on file.     Social History Main Topics   ??? Smoking status: Former Smoker   ??? Smokeless tobacco: Never Used   ??? Alcohol Use: Yes      Comment: on occasion   ??? Drug Use: Not on file   ??? Sexual Activity: Not on file     Other Topics Concern   ??? Not on file     Social History Narrative       Review of Systems   Constitutional: Negative for fever and chills.   HENT: Positive for congestion.    Eyes: Negative for blurred vision and double vision.   Respiratory: Positive for cough. Negative for shortness of breath.    Cardiovascular: Negative for chest pain, palpitations and claudication.   Gastrointestinal: Negative for heartburn, abdominal pain, constipation and blood in stool.   Genitourinary: Positive for frequency. Negative for dysuria and hematuria.   Musculoskeletal: Positive for back pain.   Skin: Negative for itching and rash.   Neurological: Negative for dizziness, tingling, focal weakness and headaches.   Endo/Heme/Allergies: Positive for environmental allergies. Negative for polydipsia.   Psychiatric/Behavioral: Positive for depression. Negative for suicidal ideas and memory loss. The patient is nervous/anxious.        OBJECTIVE:     BP 106/72 mmHg   Pulse 74   Temp(Src) 97.5 ??F (36.4 ??C) (Oral)   Resp 20   Ht $R'5\' 4"'mN$  (1.626 m)   Wt 170 lb (77.111 kg)   BMI 29.17 kg/m2   SpO2 98%       Physical Exam   Constitutional: She is oriented to person, place, and time and well-developed, well-nourished, and in no distress.   HENT:   Head: Normocephalic and atraumatic.   Right Ear: Tympanic membrane and ear canal normal.   Left Ear: Tympanic membrane and ear canal normal.   Nose: Nose normal.   Mouth/Throat: Uvula is midline, oropharynx is clear and moist and mucous membranes are normal.   Eyes: Right eye visual fields normal and left eye visual  fields normal. Conjunctivae, EOM and lids are normal. Pupils are equal, round, and reactive to light.   Neck: Trachea normal and normal range of motion. Neck supple. Normal carotid pulses present. Carotid bruit is not present. No thyroid mass and no thyromegaly present.   Cardiovascular: Normal rate, normal heart sounds, intact distal pulses and normal pulses.    Pulmonary/Chest: Effort normal and breath sounds normal.   Abdominal: Soft. Normal appearance, normal aorta and bowel sounds are normal. There is no tenderness. There is no rebound.   Musculoskeletal:        Right knee: She exhibits decreased range of motion.        Lumbar back: She exhibits decreased range of motion, tenderness and pain.   Lymphadenopathy:     She has no cervical adenopathy.   Neurological: She is alert and oriented to person, place, and time. She has normal motor skills, normal sensation, normal strength and intact cranial nerves. Gait normal.   Skin: Skin is warm, dry and intact. No rash noted.   Psychiatric: Memory and judgment normal. Her affect is blunt. She exhibits a depressed mood. She expresses no homicidal and no suicidal ideation. She expresses no suicidal plans and no homicidal plans.     Labs:   Lab Results  Component Value Date/Time   HEMOGLOBIN A1C 6.5 04/20/2015 10:49 AM   GLUCOSE 103 04/20/2015 10:49 AM    LDL, CALCULATED 131.2 04/20/2015 10:49 AM   CREATININE 0.73 04/20/2015 10:49 AM      Lab Results  Component Value Date/Time   CHOLESTEROL, TOTAL 209 04/20/2015 10:49 AM   HDL CHOLESTEROL 59 04/20/2015 10:49 AM   LDL, CALCULATED 131.2 04/20/2015 10:49 AM   TRIGLYCERIDE 94 04/20/2015 10:49 AM   CHOL/HDL RATIO 3.5 04/20/2015 10:49 AM       Lab Results  Component Value Date/Time   ALT 21 04/20/2015 10:49 AM   AST 12 04/20/2015 10:49 AM   ALK. PHOSPHATASE 58 04/20/2015 10:49 AM   BILIRUBIN, TOTAL 0.5 04/20/2015 10:49 AM       Lab Results  Component Value Date/Time   GFR EST AA >60 04/20/2015 10:49 AM   GFR EST NON-AA >60 04/20/2015 10:49 AM   CREATININE 0.73 04/20/2015 10:49 AM   BUN 17 04/20/2015 10:49 AM   SODIUM 139 04/20/2015 10:49 AM   POTASSIUM 3.8 04/20/2015 10:49 AM   CHLORIDE 100 04/20/2015 10:49 AM   CO2 29 04/20/2015 10:49 AM        Discussed the patient's BMI with her.  The BMI follow up plan is as follows: BMI is out of normal parameters and plan is as follows: I have counseled this patient on diet and exercise regimens.        Assessment/Plan      ICD-10-CM ICD-9-CM    1. Essential hypertension with goal blood pressure less than 140/90 I10 401.9 Well controlled on HCTZ, Norvasc and Altace check METABOLIC PANEL, COMPREHENSIVE   2. Type 2 diabetes mellitus without complication (HCC) X52.8 250.00 Well controlled on metformin LIPID PANEL      MICROALBUMIN, UR, RAND      HEMOGLOBIN A1C W/O EAG   3. Chronic bilateral low back pain without sciatica M54.5 724.2 PMP reviewed Will check 12-DRUG SCREEN W/CONF and continue NOrco 10/325 TID    4. Anxiety F41.9 300.00 Controlled on Prozac, Buspar and Xanax 0.5 mg BID    5. Mild intermittent asthma without complication U13.24 401.02 Stable on Qvar    6. Chronic pain of right knee  M25.561 719.46 Pt to f/u with Dr Luther Redo     G89.29 338.29    7. Long term current use of opiate analgesic Z79.891 V58.69 12-DRUG SCREEN W/CONF     Follow-up Disposition:   Return in about 3 months (around 07/21/2015) for labs 1 week before.    Reviewed plan of care. Patient has provided input and agrees with goals.

## 2015-04-20 NOTE — Patient Instructions (Signed)
A Healthy Lifestyle: Care Instructions  Your Care Instructions  A healthy lifestyle can help you feel good, stay at a healthy weight, and have plenty of energy for both work and play. A healthy lifestyle is something you can share with your whole family.  A healthy lifestyle also can lower your risk for serious health problems, such as high blood pressure, heart disease, and diabetes.  You can follow a few steps listed below to improve your health and the health of your family.  Follow-up care is a key part of your treatment and safety. Be sure to make and go to all appointments, and call your doctor if you are having problems. It???s also a good idea to know your test results and keep a list of the medicines you take.  How can you care for yourself at home?  ?? Do not eat too much sugar, fat, or fast foods. You can still have dessert and treats now and then. The goal is moderation.  ?? Start small to improve your eating habits. Pay attention to portion sizes, drink less juice and soda pop, and eat more fruits and vegetables.  ?? Eat a healthy amount of food. A 3-ounce serving of meat, for example, is about the size of a deck of cards. Fill the rest of your plate with vegetables and whole grains.  ?? Limit the amount of soda and sports drinks you have every day. Drink more water when you are thirsty.  ?? Eat at least 5 servings of fruits and vegetables every day. It may seem like a lot, but it is not hard to reach this goal. A serving or helping is 1 piece of fruit, 1 cup of vegetables, or 2 cups of leafy, raw vegetables. Have an apple or some carrot sticks as an afternoon snack instead of a candy bar. Try to have fruits and/or vegetables at every meal.  ?? Make exercise part of your daily routine. You may want to start with simple activities, such as walking, bicycling, or slow swimming. Try to be active 30 to 60 minutes every day. You do not need to do all 30 to 60  minutes all at once. For example, you can exercise 3 times a day for 10 or 20 minutes. Moderate exercise is safe for most people, but it is always a good idea to talk to your doctor before starting an exercise program.  ?? Keep moving. Mow the lawn, work in the garden, or clean your house. Take the stairs instead of the elevator at work.  ?? If you smoke, quit. People who smoke have an increased risk for heart attack, stroke, cancer, and other lung illnesses. Quitting is hard, but there are ways to boost your chance of quitting tobacco for good.  ?? Use nicotine gum, patches, or lozenges.  ?? Ask your doctor about stop-smoking programs and medicines.  ?? Keep trying.  In addition to reducing your risk of diseases in the future, you will notice some benefits soon after you stop using tobacco. If you have shortness of breath or asthma symptoms, they will likely get better within a few weeks after you quit.  ?? Limit how much alcohol you drink. Moderate amounts of alcohol (up to 2 drinks a day for men, 1 drink a day for women) are okay. But drinking too much can lead to liver problems, high blood pressure, and other health problems.  Family health  If you have a family, there are many things you can do   together to improve your health.  ?? Eat meals together as a family as often as possible.  ?? Eat healthy foods. This includes fruits, vegetables, lean meats and dairy, and whole grains.  ?? Include your family in your fitness plan. Most people think of activities such as jogging or tennis as the way to fitness, but there are many ways you and your family can be more active. Anything that makes you breathe hard and gets your heart pumping is exercise. Here are some tips:  ?? Walk to do errands or to take your child to school or the bus.  ?? Go for a family bike ride after dinner instead of watching TV.   Where can you learn more?   Go to http://www.healthwise.net/[facility]   Enter U807 in the search box to learn more about "A Healthy Lifestyle: Care Instructions."   ?? 2006-2016 [employer]. Care instructions adapted under license by [place] (which disclaims liability or warranty for this information). This care instruction is for use with your licensed healthcare professional. If you have questions about a medical condition or this instruction, always ask your healthcare professional. Healthwise, Incorporated disclaims any warranty or liability for your use of this information.  Content Version: 10.8.513193; Current as of: October 24, 2014

## 2015-04-21 LAB — MICROALBUMIN, UR, RAND W/ MICROALB/CREAT RATIO
Creatinine, urine random: 195.76 mg/dL — ABNORMAL HIGH (ref 30–125)
Microalbumin,urine random: 1.6 MG/DL (ref 0–3.0)
Microalbumin/Creat ratio (mg/g creat): 8 mg/g (ref 0–30)

## 2015-04-23 LAB — BENZODIAZEPINES, CONFIRM
ALPRAZOLAM: POSITIVE — AB
Alprazolam confirm: 925 ng/mL
BENZODIAZEPINES: POSITIVE ng/mL — AB
CLONAZEPAM: NEGATIVE
FLURAZEPAM: NEGATIVE
LORAZEPAM: NEGATIVE
MIDAZOLAM: NEGATIVE
NORDIAZEPAM: NEGATIVE
OXAZEPAM: NEGATIVE
TEMAZEPAM: NEGATIVE
TRIAZOLAM: NEGATIVE

## 2015-04-23 LAB — 12-DRUG SCREEN W/CONF
Amphetamines, urine: NEGATIVE ng/mL
Barbiturates: NEGATIVE ng/mL
Cannabinoids: NEGATIVE ng/mL
Cocaine metabolites: NEGATIVE ng/mL
Creatinine: 185.3 mg/dL (ref 20.0–300.0)
Ethanol, urine QT: NEGATIVE %
Meperidine: NEGATIVE ng/mL
Methadone: NEGATIVE ng/mL
Opiates: NEGATIVE ng/mL
Oxycodone/Oxymorphone, urine: NEGATIVE ng/mL
Phencyclidine: NEGATIVE ng/mL
Propoxyphene: NEGATIVE ng/mL

## 2015-05-05 MED ORDER — ALPRAZOLAM 0.5 MG TAB
0.5 mg | ORAL_TABLET | Freq: Two times a day (BID) | ORAL | Status: DC
Start: 2015-05-05 — End: 2015-06-03

## 2015-05-05 NOTE — Telephone Encounter (Signed)
Last OV 04/20/15  Next OV 08/03/15

## 2015-05-05 NOTE — Telephone Encounter (Signed)
Requested Prescriptions     Pending Prescriptions Disp Refills   ??? ALPRAZolam (XANAX) 0.5 mg tablet  2

## 2015-05-06 NOTE — Telephone Encounter (Signed)
Pt aware written RX request is ready for pickup.

## 2015-05-18 ENCOUNTER — Ambulatory Visit: Admit: 2015-05-18 | Discharge: 2015-05-18 | Payer: MEDICARE | Attending: Internal Medicine | Primary: Family Medicine

## 2015-05-18 DIAGNOSIS — R519 Headache, unspecified: Secondary | ICD-10-CM

## 2015-05-18 DIAGNOSIS — I10 Essential (primary) hypertension: Secondary | ICD-10-CM | POA: Diagnosis not present

## 2015-05-18 DIAGNOSIS — R51 Headache: Secondary | ICD-10-CM | POA: Diagnosis not present

## 2015-05-18 DIAGNOSIS — H60392 Other infective otitis externa, left ear: Secondary | ICD-10-CM | POA: Diagnosis not present

## 2015-05-18 DIAGNOSIS — R220 Localized swelling, mass and lump, head: Secondary | ICD-10-CM | POA: Diagnosis not present

## 2015-05-18 DIAGNOSIS — K120 Recurrent oral aphthae: Secondary | ICD-10-CM | POA: Diagnosis not present

## 2015-05-18 MED ORDER — CEPHALEXIN 500 MG CAP
500 mg | ORAL_CAPSULE | Freq: Three times a day (TID) | ORAL | Status: AC
Start: 2015-05-18 — End: 2015-05-28

## 2015-05-18 MED ORDER — PREDNISONE 10 MG TABLETS IN A DOSE PACK
10 mg | ORAL_TABLET | ORAL | Status: DC
Start: 2015-05-18 — End: 2015-06-11

## 2015-05-18 MED ORDER — HYDROCODONE-ACETAMINOPHEN 10 MG-325 MG TAB
10-325 mg | ORAL_TABLET | Freq: Three times a day (TID) | ORAL | Status: DC | PRN
Start: 2015-05-18 — End: 2015-07-10

## 2015-05-18 NOTE — Progress Notes (Signed)
Patient is in the office today for left ear pain and swelling and mouth lesions.    Do you have an Advance Directive no  Do you want more information: patient has not finished filling paper out.     1. Have you been to the ER, urgent care clinic since your last visit?  Hospitalized since your last visit?No    2. Have you seen or consulted any other health care providers outside of the [facility] since your last visit?  Include any pap smears or colon screening. No

## 2015-05-18 NOTE — Patient Instructions (Signed)
Back Pain: Care Instructions  Your Care Instructions     Back pain has many possible causes. It is often related to problems with muscles and ligaments of the back. It may also be related to problems with the nerves, discs, or bones of the back. Moving, lifting, standing, sitting, or sleeping in an awkward way can strain the back. Sometimes you don't notice the injury until later. Arthritis is another common cause of back pain.  Although it may hurt a lot, back pain usually improves on its own within several weeks. Most people recover in 12 weeks or less. Using good home treatment and being careful not to stress your back can help you feel better sooner.  Follow-up care is a key part of your treatment and safety. Be sure to make and go to all appointments, and call your doctor if you are having problems. It???s also a good idea to know your test results and keep a list of the medicines you take.  How can you care for yourself at home?  ?? Sit or lie in positions that are most comfortable and reduce your pain. Try one of these positions when you lie down:  ?? Lie on your back with your knees bent and supported by large pillows.  ?? Lie on the floor with your legs on the seat of a sofa or chair.  ?? Lie on your side with your knees and hips bent and a pillow between your legs.  ?? Lie on your stomach if it does not make pain worse.  ?? Do not sit up in bed, and avoid soft couches and twisted positions. Bed rest can help relieve pain at first, but it delays healing. Avoid bed rest after the first day of back pain.  ?? Change positions every 30 minutes. If you must sit for long periods of time, take breaks from sitting. Get up and walk around, or lie in a comfortable position.  ?? Try using a heating pad on a low or medium setting for 15 to 20 minutes every 2 or 3 hours. Try a warm shower in place of one session with the heating pad.  ?? You can also try an ice pack for 10 to 15 minutes every 2 to 3 hours.  Put a thin cloth between the ice pack and your skin.  ?? Take pain medicines exactly as directed.  ?? If the doctor gave you a prescription medicine for pain, take it as prescribed.  ?? If you are not taking a prescription pain medicine, ask your doctor if you can take an over-the-counter medicine.  ?? Take short walks several times a day. You can start with 5 to 10 minutes, 3 or 4 times a day, and work up to longer walks. Walk on level surfaces and avoid hills and stairs until your back is better.  ?? Return to work and other activities as soon as you can. Continued rest without activity is usually not good for your back.  ?? To prevent future back pain, do exercises to stretch and strengthen your back and stomach. Learn how to use good posture, safe lifting techniques, and proper body mechanics.  When should you call for help?  Call your doctor now or seek immediate medical care if:  ?? You have new or worsening numbness in your legs.  ?? You have new or worsening weakness in your legs. (This could make it hard to stand up.)  ?? You lose control of your bladder or bowels.    Watch closely for changes in your health, and be sure to contact your doctor if:  ?? Your pain gets worse.  ?? You are not getting better after 2 weeks.  Where can you learn more?  Go to http://www.healthwise.net/[employer]  Enter I594 in the search box to learn more about "Back Pain: Care Instructions."  ?? 2006-2016 [employer]. Care instructions adapted under license by [employer] (which disclaims liability or warranty for this information). This care instruction is for use with your licensed healthcare professional. If you have questions about a medical condition or this instruction, always ask your healthcare professional. Healthwise, Incorporated disclaims any warranty or liability for your use of this information.  Content Version: 10.9.538570; Current as of: Apr 25, 2014

## 2015-05-18 NOTE — Progress Notes (Signed)
Cynthia Marsh is a 64 y.o.  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 The ear never drained before. She has had some associated chills and swelling of the left side of her face. She has had increased nasal congestion and noticed some hoarsness in her throat. Pt has also had some ulcers in his upper lip with some mild swelling that was initially larger 2 days ago but is improving. Pt is on Altace but the associated with this swelling is atypical for angioedema.       Respiratory ROS: negative for - shortness of breath  Cardiovascular ROS: negative for - chest pain    Current Outpatient Prescriptions   Medication Sig   ??? HYDROcodone-acetaminophen (NORCO) 10-325 mg tablet Take 1 Tab by mouth every eight (8) hours as needed for Pain. Max Daily Amount: 3 Tabs.   ??? cephALEXin (KEFLEX) 500 mg capsule Take 1 Cap by mouth three (3) times daily for 10 days.   ??? predniSONE (STERAPRED DS) 10 mg dose pack Take 1 Tab by mouth See Admin Instructions.   ??? ALPRAZolam (XANAX) 0.5 mg tablet Take 1 Tab by mouth two (2) times a day. Max Daily Amount: 1 mg.   ??? mometasone (NASONEX) 50 mcg/actuation nasal spray 2 Sprays daily.   ??? mirtazapine (REMERON) 30 mg tablet Take  by mouth nightly.   ??? calcium citrate-vitamin d3 (CITRACAL+D) 315-200 mg-unit tab Take 1 Tab by mouth daily (with breakfast).   ??? metFORMIN (GLUCOPHAGE) 1,000 mg tablet Take 1,000 mg by mouth two (2) times daily (with meals).   ??? busPIRone (BUSPAR) 15 mg tablet    ??? oxybutynin chloride XL (DITROPAN XL) 10 mg CR tablet    ??? QVAR 80 mcg/actuation inhaler    ??? zolpidem (AMBIEN) 10 mg tablet    ??? amLODIPine (NORVASC) 10 mg tablet    ??? azelastine (ASTELIN) 137 mcg (0.1 %) nasal spray    ??? FLUoxetine (PROZAC) 40 mg capsule    ??? hydrochlorothiazide (HYDRODIURIL) 25 mg tablet    ??? ramipril (ALTACE) 5 mg capsule    ??? montelukast (SINGULAIR) 10 mg tablet     ??? albuterol (PROVENTIL HFA, VENTOLIN HFA, PROAIR HFA) 90 mcg/actuation inhaler Take  by inhalation.   ??? albuterol-ipratropium (DUO-NEB) 2.5 mg-0.5 mg/3 ml nebu 3 mL by Nebulization route.     No current facility-administered medications for this visit.         OBJECTIVE:  alert, well appearing, and in no distress  BP 92/60 mmHg   Pulse 80   Temp(Src) 97.9 ??F (36.6 ??C) (Oral)   Resp 20   Ht  (1.626 m)   Wt 171 lb (77.565 kg)   BMI 29.34 kg/m2   SpO2 98%   well developed and well nourished  Mouth - pt with 2, 5 mm ulcers on inside of top lip, mild swelling left face. Lips are not swollen.   HEENT mild drainage from small hole at top of left ear.          Assessment/Plan      ICD-10-CM ICD-9-CM    1. Left facial pain R51 784.0 Will treat with predniSONE (STERAPRED DS) 10 mg dose pack   2. Ulcer aphthous oral K12.0 528.2 Will l continue to monitor for now    3. Other infective acute otitis externa of left ear H60.392 380.10  Will treat with cephALEXin (KEFLEX) 500 mg capsule   4. Swelling of left side of face R22.0 784.2 Will  treat with predniSONE (STERAPRED DS) 10 mg dose pack if not improving or worsening pt to go the ER     Follow-up Disposition:  Return if symptoms worsen or fail to improve.    Reviewed plan of care. Patient has provided input and agrees with goals.

## 2015-06-02 ENCOUNTER — Emergency Department: Admit: 2015-06-02 | Payer: MEDICARE | Primary: Family Medicine

## 2015-06-02 ENCOUNTER — Inpatient Hospital Stay
Admit: 2015-06-02 | Discharge: 2015-06-02 | Disposition: A | Discharge status: Home / Self Care | Payer: MEDICARE | Attending: Emergency Medicine

## 2015-06-02 DIAGNOSIS — R42 Dizziness and giddiness: Secondary | ICD-10-CM

## 2015-06-02 DIAGNOSIS — R0602 Shortness of breath: Secondary | ICD-10-CM | POA: Diagnosis not present

## 2015-06-02 DIAGNOSIS — E119 Type 2 diabetes mellitus without complications: Secondary | ICD-10-CM | POA: Diagnosis not present

## 2015-06-02 DIAGNOSIS — H9319 Tinnitus, unspecified ear: Secondary | ICD-10-CM | POA: Diagnosis not present

## 2015-06-02 DIAGNOSIS — I10 Essential (primary) hypertension: Secondary | ICD-10-CM | POA: Diagnosis not present

## 2015-06-02 DIAGNOSIS — Z87891 Personal history of nicotine dependence: Secondary | ICD-10-CM | POA: Diagnosis not present

## 2015-06-02 DIAGNOSIS — R6883 Chills (without fever): Secondary | ICD-10-CM | POA: Diagnosis not present

## 2015-06-02 DIAGNOSIS — M810 Age-related osteoporosis without current pathological fracture: Secondary | ICD-10-CM | POA: Diagnosis not present

## 2015-06-02 DIAGNOSIS — R079 Chest pain, unspecified: Secondary | ICD-10-CM | POA: Diagnosis not present

## 2015-06-02 DIAGNOSIS — R51 Headache: Secondary | ICD-10-CM | POA: Diagnosis not present

## 2015-06-02 DIAGNOSIS — J45909 Unspecified asthma, uncomplicated: Secondary | ICD-10-CM | POA: Diagnosis not present

## 2015-06-02 LAB — CBC WITH AUTOMATED DIFF
ABS. BASOPHILS: 0 10*3/uL (ref 0.0–0.06)
ABS. EOSINOPHILS: 0.1 10*3/uL (ref 0.0–0.4)
ABS. LYMPHOCYTES: 2.1 10*3/uL (ref 0.9–3.6)
ABS. MONOCYTES: 0.5 10*3/uL (ref 0.05–1.2)
ABS. NEUTROPHILS: 3.2 10*3/uL (ref 1.8–8.0)
BASOPHILS: 0 % (ref 0–2)
EOSINOPHILS: 1 % (ref 0–5)
HCT: 38.5 % (ref 35.0–45.0)
HGB: 12.2 g/dL (ref 12.0–16.0)
LYMPHOCYTES: 36 % (ref 21–52)
MCH: 27.3 PG (ref 24.0–34.0)
MCHC: 31.7 g/dL (ref 31.0–37.0)
MCV: 86.1 FL (ref 74.0–97.0)
MONOCYTES: 8 % (ref 3–10)
MPV: 8.8 FL — ABNORMAL LOW (ref 9.2–11.8)
NEUTROPHILS: 55 % (ref 40–73)
PLATELET: 285 10*3/uL (ref 135–420)
RBC: 4.47 M/uL (ref 4.20–5.30)
RDW: 14.9 % — ABNORMAL HIGH (ref 11.6–14.5)
WBC: 5.8 10*3/uL (ref 4.6–13.2)

## 2015-06-02 LAB — METABOLIC PANEL, COMPREHENSIVE
A-G Ratio: 1 (ref 0.8–1.7)
ALT (SGPT): 40 U/L (ref 13–56)
AST (SGOT): 17 U/L (ref 15–37)
Albumin: 3.8 g/dL (ref 3.4–5.0)
Alk. phosphatase: 51 U/L (ref 45–117)
Anion gap: 5 mmol/L (ref 3.0–18)
BUN/Creatinine ratio: 24 — ABNORMAL HIGH (ref 12–20)
BUN: 17 MG/DL (ref 7.0–18)
Bilirubin, total: 0.3 MG/DL (ref 0.2–1.0)
CO2: 31 mmol/L (ref 21–32)
Calcium: 10.1 MG/DL (ref 8.5–10.1)
Chloride: 103 mmol/L (ref 100–108)
Creatinine: 0.72 MG/DL (ref 0.6–1.3)
GFR est AA: >60 mL/min/{1.73_m2} (ref >60)
GFR est non-AA: >60 mL/min/{1.73_m2} (ref >60)
Globulin: 3.9 g/dL (ref 2.0–4.0)
Glucose: 112 mg/dL — ABNORMAL HIGH (ref 74–99)
Potassium: 3.5 mmol/L (ref 3.5–5.5)
Protein, total: 7.7 g/dL (ref 6.4–8.2)
Sodium: 139 mmol/L (ref 136–145)

## 2015-06-02 LAB — URINALYSIS W/ RFLX MICROSCOPIC
Bilirubin: NEGATIVE
Blood: NEGATIVE
Glucose: NEGATIVE mg/dL
Ketone: NEGATIVE mg/dL
Nitrites: NEGATIVE
Protein: NEGATIVE mg/dL
Specific gravity: 1.02 (ref 1.005–1.030)
Urobilinogen: 1 EU/dL (ref 0.2–1.0)
pH (UA): 7.5 (ref 5.0–8.0)

## 2015-06-02 LAB — URINE MICROSCOPIC ONLY
Bacteria: NEGATIVE /hpf
RBC: NEGATIVE /hpf (ref 0–5)
WBC: 0 /hpf (ref 0–4)

## 2015-06-02 LAB — TSH 3RD GENERATION: TSH: 0.77 u[IU]/mL (ref 0.36–3.74)

## 2015-06-02 LAB — MAGNESIUM: Magnesium: 1.9 mg/dL (ref 1.8–2.4)

## 2015-06-02 LAB — GLUCOSE, POC: Glucose (POC): 119 mg/dL — ABNORMAL HIGH (ref 70–110)

## 2015-06-02 MED ORDER — SODIUM CHLORIDE 0.9% BOLUS IV
0.9 % | Freq: Once | INTRAVENOUS | Status: AC
Start: 2015-06-02 — End: 2015-06-02
  Administered 2015-06-02: 17:00:00 via INTRAVENOUS

## 2015-06-02 MED ORDER — FAMOTIDINE (PF) 20 MG/2 ML IV
20 mg/2 mL | INTRAVENOUS | Status: AC
Start: 2015-06-02 — End: 2015-06-02
  Administered 2015-06-02: 19:00:00 via INTRAVENOUS

## 2015-06-02 MED ORDER — MECLIZINE 12.5 MG TAB
12.5 mg | ORAL | Status: AC
Start: 2015-06-02 — End: 2015-06-02
  Administered 2015-06-02: 19:00:00 via ORAL

## 2015-06-02 MED ORDER — MECLIZINE 25 MG TAB
25 mg | ORAL_TABLET | Freq: Three times a day (TID) | ORAL | Status: DC | PRN
Start: 2015-06-02 — End: 2016-01-11

## 2015-06-02 MED ORDER — MECLIZINE 12.5 MG TAB
12.5 mg | Freq: Every day | ORAL | Status: DC
Start: 2015-06-02 — End: 2015-06-02

## 2015-06-02 MED ORDER — SODIUM CHLORIDE 0.9% BOLUS IV
0.9 % | Freq: Once | INTRAVENOUS | Status: AC
Start: 2015-06-02 — End: 2015-06-02
  Administered 2015-06-02: 19:00:00 via INTRAVENOUS

## 2015-06-02 MED FILL — SODIUM CHLORIDE 0.9 % IV: INTRAVENOUS | Qty: 500

## 2015-06-02 MED FILL — MECLIZINE 12.5 MG TAB: 12.5 mg | ORAL | Qty: 4

## 2015-06-02 MED FILL — FAMOTIDINE (PF) 20 MG/2 ML IV: 20 mg/2 mL | INTRAVENOUS | Qty: 2

## 2015-06-02 NOTE — ED Notes (Signed)
Patient to ER with c/o HA, dizziness. Same episode yesterday. Also c/o decreased appetite today. Ambulatory on arrival. . HA pain rated at 9/10.

## 2015-06-02 NOTE — Telephone Encounter (Signed)
Noted  

## 2015-06-02 NOTE — ED Notes (Signed)
I have reviewed discharge instructions with the patient.  The patient verbalized understanding.

## 2015-06-02 NOTE — ED Notes (Signed)
I performed a brief evaluation, including history and physical, of the patient here in triage and I have determined that pt will need further treatment and evaluation from the main side ER physician.  I have placed initial orders to help in expediting patients care.     June 02, 2015 at 12:25 PM - Garlon HatchetJohn W Anna Beaird, MD        Visit Vitals   Item Reading   ??? BP 130/83 mmHg   ??? Pulse 82   ??? Temp 98.9 ??F (37.2 ??C)   ??? Resp 18   ??? Ht 5\' 4"  (1.626 m)   ??? Wt 77.111 kg (170 lb)   ??? BMI 29.17 kg/m2   ??? SpO2 97%

## 2015-06-02 NOTE — ED Notes (Signed)
Pt states that her dizziness and HA are gone. Pt ambulated with steady gait and states that she had no dizziness with ambulation.

## 2015-06-02 NOTE — ED Provider Notes (Signed)
HPI Comments: 12:25 PM Cynthia Marsh is a 63 y.o. female with a history of HTN, DM, Asthma and CKD  who presents to ED c/o HA and Dizziness. Pt explains that she began to experience an intermittent HA 4 days ago that worsened today. She states that yesterday while getting out of her car she began to feel very lightheaded and explains, "everything was spinning." Today the pt explains that she went for a walk and upon returning home she began to feel similar symptoms as well as chills. Pt also c/o tinnitus 4 days ago that has since relived as well as increased belching (present) and metallic taste in mouth (present). Pt denies N/V or any other symptoms at this time. Pt denies any new medications or changes in old medications.No other acute complaints or concerns were noted.      PCP: Cynthia Brynda Peon, MD        The history is provided by the patient.        Past Medical History:   Diagnosis Date   ??? Asthma    ??? Chronic kidney disease    ??? Diabetes (HCC)    ??? Hypertension    ??? Osteoporosis    ??? Depression    ??? Anxiety    ??? Insomnia        Past Surgical History:   Procedure Laterality Date   ??? Pr anesth,surgery of shoulder     ??? Pr hand/finger surgery unlisted     ??? Pr laminectomy,cervical     ??? Hx cyst removal       calf    ??? Hx gyn       complete hyst    ??? Hx gyn       tubal ligation   ??? Hx wrist fracture tx     ??? Hx back surgery     ??? Hx orthopaedic       Right shoulder rotator cuff    ??? Hx orthopaedic       Left Little toe hammer toe, left big toe bunion removed   ??? Hx orthopaedic       Right knee torn meniscus   ??? Hx orthopaedic       CTS bilateral , Left thumb trigger finger    ??? Pr breast surgery procedure unlisted        Left cyst removed          Family History:   Problem Relation Age of Onset   ??? Diabetes Mother    ??? Diabetes Brother        History     Social History   ??? Marital Status: SINGLE     Spouse Name: N/A   ??? Number of Children: N/A   ??? Years of Education: N/A     Occupational History   ??? Not on file.      Social History Main Topics   ??? Smoking status: Former Smoker   ??? Smokeless tobacco: Never Used   ??? Alcohol Use: Yes      Comment: on occasion   ??? Drug Use: Not on file   ??? Sexual Activity: Not on file     Other Topics Concern   ??? Not on file     Social History Narrative         ALLERGIES: Castor oil; Cozaar; and Crestor    Review of Systems   Constitutional: Positive for chills. Negative for fever.   HENT: Positive for tinnitus. Negative for sore  throat.    Eyes: Negative for redness.   Respiratory: Negative for shortness of breath and wheezing.    Gastrointestinal: Negative for nausea, vomiting and abdominal pain.        "belching"     Genitourinary: Negative for dysuria.   Musculoskeletal: Negative for neck stiffness.   Skin: Negative for pallor.   Neurological: Positive for dizziness, light-headedness and headaches.   Hematological: Does not bruise/bleed easily.   All other systems reviewed and are negative.      Filed Vitals:    06/02/15 1358 06/02/15 1400 06/02/15 1430 06/02/15 1500   BP: 134/86 143/93 145/87 128/89   Pulse: 68 74 73 74   Temp:       Resp:  13 8 12    Height:       Weight:       SpO2:  97%  98%            Physical Exam   Constitutional: She is oriented to person, place, and time. She appears well-developed and well-nourished. No distress.   Active belching at bedside.    HENT:   Head: Normocephalic and atraumatic.   Mouth/Throat: Oropharynx is clear and moist.   Eyes: EOM are normal.   Neck: Normal range of motion. Neck supple.   Cardiovascular:   Capillary refill < 3 seconds   Pulmonary/Chest: Effort normal and breath sounds normal. No respiratory distress.   Abdominal: Soft. Bowel sounds are normal. There is no tenderness.   Musculoskeletal: Normal range of motion. She exhibits no edema.   Lymphadenopathy:     She has no cervical adenopathy.   Neurological: She is alert and oriented to person, place, and time. She has normal strength. No cranial nerve deficit.    No cerebellar ataxia on finger to nose test.   Strength 5/5  Sensation intact.  No facial droop. No slurred speech.  Cranial nerves intact.     Skin: Skin is warm. She is not diaphoretic.   Nursing note and vitals reviewed.       MDM  Number of Diagnoses or Management Options  Vertigo:   Diagnosis management comments: ddx vertigo, dehydration, orthostatic hypotension, metabolic    Consider CT head  IVF, meclizine    After meds and IVF, sx's resolved    I have reassessed the patient. I have discussed the workup, results and plan with the patient and patient is in agreement.  Patient is feeling much better.  Patient will be prescribed meclizine.  Patient was discharge in stable condition. Patient was given outpatient follow up.  Patient is to return to emergency department if any new or worsening condition.       Amount and/or Complexity of Data Reviewed  Clinical lab tests: ordered and reviewed  Tests in the radiology section of CPT??: ordered and reviewed  Tests in the medicine section of CPT??: ordered and reviewed    Risk of Complications, Morbidity, and/or Mortality  Presenting problems: moderate  Diagnostic procedures: moderate  Management options: moderate    Patient Progress  Patient progress: improved      Procedures    PROGRESS NOTES  1:17 PM: Cynthia Schlein, DO arrives to the bedside to evaluate the patient. Answered the patient's questions regarding the treatment plan.    3:42 PM Pt is doing well. States she feels better after fluids and Antivert, ambulating without difficulty states she wants to go home.       ED PHYSICIAN ORDERS  Orders Placed This Encounter   ???  XR CHEST PORT     Standing Status: Standing      Number of Occurrences: 1      Standing Expiration Date:      Order Specific Question:  Reason for Exam     Answer:  chest pain, sob, and/or arrhythmia   ??? CBC WITH AUTOMATED DIFF     Standing Status: Standing      Number of Occurrences: 1      Standing Expiration Date:     ??? METABOLIC PANEL, COMPREHENSIVE     Standing Status: Standing      Number of Occurrences: 1      Standing Expiration Date:    ??? MAGNESIUM     Standing Status: Standing      Number of Occurrences: 1      Standing Expiration Date:    ??? TSH 3RD GENERATION     Standing Status: Standing      Number of Occurrences: 1      Standing Expiration Date:    ??? URINALYSIS W/ RFLX MICROSCOPIC     Standing Status: Standing      Number of Occurrences: 1      Standing Expiration Date:    ??? URINE MICROSCOPIC ONLY     Standing Status: Standing      Number of Occurrences: 1      Standing Expiration Date:    ??? B/P LYING/SIT/STANDING     Standing Status: Standing      Number of Occurrences: 1      Standing Expiration Date:    ??? GLUCOSE, POC     Standing Status: Standing      Number of Occurrences: 1      Standing Expiration Date:    ??? EKG, 12 LEAD, INITIAL     Standing Status: Standing      Number of Occurrences: 1      Standing Expiration Date:      Order Specific Question:  Reason for Exam:     Answer:  CP/SOB/tachy   ??? sodium chloride 0.9 % bolus infusion 500 mL     Sig:    ??? famotidine (PF) (PEPCID) injection 20 mg     Sig:    ??? sodium chloride 0.9 % bolus infusion 500 mL     Sig:    ??? DISCONTD: meclizine (ANTIVERT) tablet 50 mg     Sig:    ??? meclizine (ANTIVERT) tablet 50 mg     Sig:            MEDICATIONS ORDERED  Medications   sodium chloride 0.9 % bolus infusion 500 mL (0 mL IntraVENous IV Completed 06/02/15 1428)   famotidine (PF) (PEPCID) injection 20 mg (20 mg IntraVENous Given 06/02/15 1433)   sodium chloride 0.9 % bolus infusion 500 mL (500 mL IntraVENous New Bag 06/02/15 1433)   meclizine (ANTIVERT) tablet 50 mg (50 mg Oral Given 06/02/15 1433)         Vitals:  Patient Vitals for the past 12 hrs:   Temp Pulse Resp BP SpO2   06/02/15 1500 - 74 12 128/89 mmHg 98 %   06/02/15 1430 - 73 8 145/87 mmHg -   06/02/15 1400 - 74 13 (!) 143/93 mmHg 97 %   06/02/15 1358 - 68 - 134/86 mmHg -   06/02/15 1355 - - - - 97 %    06/02/15 1223 98.9 ??F (37.2 ??C) 82 18 130/83 mmHg 97 %  RADIOLOGY INTERPRETATIONS  XR CHEST PORT   Impression: Per radiology    ??  Upper normal or mildly enlarged cardiac silhouette.  ??  Subsegmental atelectasis or scar in the right base. Otherwise no acute pulmonary  process.              LABORATORY RESULTS  Labs Reviewed   CBC WITH AUTOMATED DIFF - Abnormal; Notable for the following:     RDW 14.9 (*)     MPV 8.8 (*)     All other components within normal limits   METABOLIC PANEL, COMPREHENSIVE - Abnormal; Notable for the following:     Glucose 112 (*)     BUN/Creatinine ratio 24 (*)     All other components within normal limits   URINALYSIS W/ RFLX MICROSCOPIC - Abnormal; Notable for the following:     Leukocyte Esterase TRACE (*)     All other components within normal limits   GLUCOSE, POC - Abnormal; Notable for the following:     Glucose (POC) 119 (*)     All other components within normal limits   MAGNESIUM   TSH 3RD GENERATION   URINE MICROSCOPIC ONLY             EKG interpretation by ED Dr. Kristeen Mansews  1316: Normal Sinus Rhythm at a rate of 69. Normal QRS. No ST elevations. No T wave inversions.      ED DIAGNOSIS & DISPOSITION INFORMATION  Diagnosis:   1. Dizziness          Disposition: discharged    Follow-up Information     Follow up With Details Comments Contact Info    Marvia PicklesNoel L Hunte, MD Schedule an appointment as soon as possible for a visit in 2 days As needed [address]  [place] [id number]  [phone]      [facility]  If symptoms worsen [address]  [place] [id number]  [phone]          Discharge Medication List as of 06/02/2015  3:52 PM      START taking these medications    Details   meclizine (ANTIVERT) 25 mg tablet Take 1 Tab by mouth three (3) times daily as needed. Indications: VERTIGO, Print, Disp-10 Tab, R-0         CONTINUE these medications which have NOT CHANGED    Details   HYDROcodone-acetaminophen (NORCO) 10-325 mg tablet Take 1 Tab by mouth  every eight (8) hours as needed for Pain. Max Daily Amount: 3 Tabs., Print, Disp-90 Tab, R-0      predniSONE (STERAPRED DS) 10 mg dose pack Take 1 Tab by mouth See Admin Instructions., Normal, Disp-21 Tab, R-0      ALPRAZolam (XANAX) 0.5 mg tablet Take 1 Tab by mouth two (2) times a day. Max Daily Amount: 1 mg., Print, Disp-60 Tab, R-0      mometasone (NASONEX) 50 mcg/actuation nasal spray 2 Sprays daily., Historical Med      albuterol (PROVENTIL HFA, VENTOLIN HFA, PROAIR HFA) 90 mcg/actuation inhaler Take  by inhalation., Historical Med      mirtazapine (REMERON) 30 mg tablet Take  by mouth nightly., Historical Med      calcium citrate-vitamin d3 (CITRACAL+D) 315-200 mg-unit tab Take 1 Tab by mouth daily (with breakfast)., Historical Med      metFORMIN (GLUCOPHAGE) 1,000 mg tablet Take 1,000 mg by mouth two (2) times daily (with meals)., Historical Med      albuterol-ipratropium (DUO-NEB) 2.5 mg-0.5 mg/3  ml nebu 3 mL by Nebulization route., Historical Med      busPIRone (BUSPAR) 15 mg tablet Historical Med, R-4      oxybutynin chloride XL (DITROPAN XL) 10 mg CR tablet Historical Med, R-4      QVAR 80 mcg/actuation inhaler Historical Med, R-11, DAW      zolpidem (AMBIEN) 10 mg tablet Historical Med, R-3      amLODIPine (NORVASC) 10 mg tablet Historical Med, R-4      azelastine (ASTELIN) 137 mcg (0.1 %) nasal spray Historical Med, R-4      FLUoxetine (PROZAC) 40 mg capsule Historical Med, R-4      hydrochlorothiazide (HYDRODIURIL) 25 mg tablet Historical Med, R-4      ramipril (ALTACE) 5 mg capsule Historical Med, R-4      montelukast (SINGULAIR) 10 mg tablet Historical Med, R-3             ATTESTATION STATEMENT  Scribe Attestation:     I, Lanice Schwab, scribing for and in the presence of Angelena Sole, DO June 02, 2015 at 3:45 PM     Physician Attestation:   I personally performed the services described in this documentation, reviewed and edited the documentation which was dictated to the scribe in  my presence, and it accurately records my words and actions. Angelena Sole, DO June 02, 2015 at 3:45 PM

## 2015-06-02 NOTE — Telephone Encounter (Signed)
Pt called in she was concerned and not sure what to do put felt light headed twice yesterday. Pt also felt light headed while going for a walk early this morning so she cut her walk short and came home to to rest after resting pt took a shower and now she has chills and a bad taste in her mouth( metal ). I spoke with Cornetta the nurse navigator she advise me to advise the patient to go to the ER with symptom that she is having. I advise the patient to go to the ER and she will be gong

## 2015-06-03 DIAGNOSIS — F332 Major depressive disorder, recurrent severe without psychotic features: Secondary | ICD-10-CM | POA: Diagnosis not present

## 2015-06-03 LAB — EKG, 12 LEAD, INITIAL
Atrial Rate: 69 {beats}/min
Calculated P Axis: 70 degrees
Calculated R Axis: 1 degrees
Calculated T Axis: 36 degrees
Diagnosis: NORMAL
P-R Interval: 174 ms
Q-T Interval: 420 ms
QRS Duration: 76 ms
QTC Calculation (Bezet): 450 ms
Ventricular Rate: 69 {beats}/min

## 2015-06-04 ENCOUNTER — Ambulatory Visit: Admit: 2015-06-04 | Discharge: 2015-06-04 | Payer: MEDICARE | Attending: Internal Medicine | Primary: Family Medicine

## 2015-06-04 DIAGNOSIS — G4485 Primary stabbing headache: Secondary | ICD-10-CM

## 2015-06-04 DIAGNOSIS — I10 Essential (primary) hypertension: Secondary | ICD-10-CM | POA: Diagnosis not present

## 2015-06-04 MED ORDER — ALPRAZOLAM 0.5 MG TAB
0.5 mg | ORAL_TABLET | ORAL | Status: DC
Start: 2015-06-04 — End: 2015-07-10

## 2015-06-04 MED ORDER — BUTALBITAL-ACETAMINOPHEN-CAFFEINE 50 MG-325 MG-40 MG TAB
50-325-40 mg | ORAL_TABLET | ORAL | Status: DC | PRN
Start: 2015-06-04 — End: 2015-08-06

## 2015-06-04 MED ORDER — ALPRAZOLAM 0.5 MG TAB
0.5 mg | ORAL_TABLET | ORAL | Status: DC
Start: 2015-06-04 — End: 2015-06-04

## 2015-06-04 MED ORDER — OXYCODONE-ACETAMINOPHEN 10 MG-325 MG TAB
10-325 mg | ORAL_TABLET | Freq: Four times a day (QID) | ORAL | Status: DC | PRN
Start: 2015-06-04 — End: 2015-07-10

## 2015-06-04 NOTE — Telephone Encounter (Signed)
Tried to leave a message for patient, voice mail not set up.  Unable to leave a message.

## 2015-06-04 NOTE — Progress Notes (Signed)
Cynthia Marsh is a 64 y.o.  female and presents with Head Pain      SUBJECTIVE:    Headache  Patient complains of headache. She does have a headache at this time.     Description of Headaches:  Location of pain: right-sided unilateral, frontal  Radiation of pain?:none  Character of pain:sharp, stabbing  Severity of pain: 10  Accompanying symptoms: nausea, vertigo  Prodromal sx?: none  Rapidity of onset: gradual  Typical duration of individual headache: 1 day  Are most headaches similar in presentation? yes  Typical precipitants: none known    Temporal Pattern of Headaches:  Started having HAs 4 weeks ago  Worst time of day: morning  Awaken from sleep?: no  Seasonal pattern?: no  ???Clustering??? of HAs over time? yes - has had headaches worsening over the last 4 weeks  Overall pattern since problem began: gradually worsening    Degree of Functional Impairment: moderate    Current Use of Meds to Treat HA:  Abortive meds? opiates (Norco)  Daily use? yes - Norco  Prophylactic meds? none    Additional Relevant History:  History of head/neck trauma? no  History of head/neck surgery? yes  Family h/o headache problems? unknwon  Use of meds that might worsen HAs? no  Exposure to carbon monoxide? unknown   Substance use: denies     Pt went to ER 6/28 and had evaluation but did not have CT of head. Pt was told she may have vertigo and was given Antivert with improvement of nausea and dizziness but not headache.     Denies any current sinus problems. Has h/o allergies and has been on allergy shouts for 5 years but not in the last year.     Respiratory ROS: negative for - shortness of breath  Cardiovascular ROS: negative for - chest pain    Current Outpatient Prescriptions   Medication Sig   ??? ALPRAZolam (XANAX) 0.5 mg tablet TAKE 1 TABLET BY MOUTH TWICE DAILY. MAX DAILY AMOUNT: 1MG    ??? butalbital-acetaminophen-caffeine (FIORICET, ESGIC) 50-325-40 mg per tablet Take 1 Tab by mouth every four (4) hours as needed for Pain. Max  Daily Amount: 6 Tabs.   ??? oxyCODONE-acetaminophen (PERCOCET 10) 10-325 mg per tablet Take 1 Tab by mouth every six (6) hours as needed for Pain. Max Daily Amount: 4 Tabs.   ??? meclizine (ANTIVERT) 25 mg tablet Take 1 Tab by mouth three (3) times daily as needed. Indications: VERTIGO   ??? HYDROcodone-acetaminophen (NORCO) 10-325 mg tablet Take 1 Tab by mouth every eight (8) hours as needed for Pain. Max Daily Amount: 3 Tabs.   ??? predniSONE (STERAPRED DS) 10 mg dose pack Take 1 Tab by mouth See Admin Instructions.   ??? mometasone (NASONEX) 50 mcg/actuation nasal spray 2 Sprays daily.   ??? albuterol (PROVENTIL HFA, VENTOLIN HFA, PROAIR HFA) 90 mcg/actuation inhaler Take  by inhalation.   ??? mirtazapine (REMERON) 30 mg tablet Take  by mouth nightly.   ??? calcium citrate-vitamin d3 (CITRACAL+D) 315-200 mg-unit tab Take 1 Tab by mouth daily (with breakfast).   ??? metFORMIN (GLUCOPHAGE) 1,000 mg tablet Take 1,000 mg by mouth two (2) times daily (with meals).   ??? albuterol-ipratropium (DUO-NEB) 2.5 mg-0.5 mg/3 ml nebu 3 mL by Nebulization route.   ??? busPIRone (BUSPAR) 15 mg tablet    ??? oxybutynin chloride XL (DITROPAN XL) 10 mg CR tablet    ??? QVAR 80 mcg/actuation inhaler    ??? zolpidem (AMBIEN) 10 mg tablet    ???  amLODIPine (NORVASC) 10 mg tablet    ??? azelastine (ASTELIN) 137 mcg (0.1 %) nasal spray    ??? FLUoxetine (PROZAC) 40 mg capsule    ??? hydrochlorothiazide (HYDRODIURIL) 25 mg tablet    ??? ramipril (ALTACE) 5 mg capsule    ??? montelukast (SINGULAIR) 10 mg tablet      No current facility-administered medications for this visit.         OBJECTIVE:  in mild to moderate distress  BP 102/80 mmHg   Pulse 79   Temp(Src) 98.2 ??F (36.8 ??C) (Oral)   Resp 16   Ht  (1.626 m)   Wt 171 lb (77.565 kg)   BMI 29.34 kg/m2   SpO2 98%   well developed and well nourished      Labs: from ER reviewed       Assessment/Plan      ICD-10-CM ICD-9-CM    1. 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 Pt knows not to take Norco ad Percocet together.      Follow-up Disposition:  Return in about 1 week (around 06/11/2015) for Headache .    Reviewed plan of care. Patient has provided input and agrees with goals.

## 2015-06-04 NOTE — Patient Instructions (Signed)
A Healthy Lifestyle: Care Instructions  Your Care Instructions  A healthy lifestyle can help you feel good, stay at a healthy weight, and have plenty of energy for both work and play. A healthy lifestyle is something you can share with your whole family.  A healthy lifestyle also can lower your risk for serious health problems, such as high blood pressure, heart disease, and diabetes.  You can follow a few steps listed below to improve your health and the health of your family.  Follow-up care is a key part of your treatment and safety. Be sure to make and go to all appointments, and call your doctor if you are having problems. It???s also a good idea to know your test results and keep a list of the medicines you take.  How can you care for yourself at home?  ?? Do not eat too much sugar, fat, or fast foods. You can still have dessert and treats now and then. The goal is moderation.  ?? Start small to improve your eating habits. Pay attention to portion sizes, drink less juice and soda pop, and eat more fruits and vegetables.  ?? Eat a healthy amount of food. A 3-ounce serving of meat, for example, is about the size of a deck of cards. Fill the rest of your plate with vegetables and whole grains.  ?? Limit the amount of soda and sports drinks you have every day. Drink more water when you are thirsty.  ?? Eat at least 5 servings of fruits and vegetables every day. It may seem like a lot, but it is not hard to reach this goal. A serving or helping is 1 piece of fruit, 1 cup of vegetables, or 2 cups of leafy, raw vegetables. Have an apple or some carrot sticks as an afternoon snack instead of a candy bar. Try to have fruits and/or vegetables at every meal.  ?? Make exercise part of your daily routine. You may want to start with simple activities, such as walking, bicycling, or slow swimming. Try to be active 30 to 60 minutes every day. You do not need to do all 30 to 60  minutes all at once. For example, you can exercise 3 times a day for 10 or 20 minutes. Moderate exercise is safe for most people, but it is always a good idea to talk to your doctor before starting an exercise program.  ?? Keep moving. Mow the lawn, work in the garden, or clean your house. Take the stairs instead of the elevator at work.  ?? If you smoke, quit. People who smoke have an increased risk for heart attack, stroke, cancer, and other lung illnesses. Quitting is hard, but there are ways to boost your chance of quitting tobacco for good.  ?? Use nicotine gum, patches, or lozenges.  ?? Ask your doctor about stop-smoking programs and medicines.  ?? Keep trying.  In addition to reducing your risk of diseases in the future, you will notice some benefits soon after you stop using tobacco. If you have shortness of breath or asthma symptoms, they will likely get better within a few weeks after you quit.  ?? Limit how much alcohol you drink. Moderate amounts of alcohol (up to 2 drinks a day for men, 1 drink a day for women) are okay. But drinking too much can lead to liver problems, high blood pressure, and other health problems.  Family health  If you have a family, there are many things you can do   together to improve your health.  ?? Eat meals together as a family as often as possible.  ?? Eat healthy foods. This includes fruits, vegetables, lean meats and dairy, and whole grains.  ?? Include your family in your fitness plan. Most people think of activities such as jogging or tennis as the way to fitness, but there are many ways you and your family can be more active. Anything that makes you breathe hard and gets your heart pumping is exercise. Here are some tips:  ?? Walk to do errands or to take your child to school or the bus.  ?? Go for a family bike ride after dinner instead of watching TV.  Where can you learn more?  Go to http://www.healthwise.net/GoodHelpConnections   Enter U807 in the search box to learn more about "A Healthy Lifestyle: Care Instructions."  ?? 2006-2016 [employer]. Care instructions adapted under license by [employer] (which disclaims liability or warranty for this information). This care instruction is for use with your licensed healthcare professional. If you have questions about a medical condition or this instruction, always ask your healthcare professional. Healthwise, Incorporated disclaims any warranty or liability for your use of this information.  Content Version: 10.9.538570; Current as of: October 24, 2014

## 2015-06-04 NOTE — Progress Notes (Signed)
Patient is in the office today for transitional care. Patient states that she is having really bad headaches and pain medication does not give her any relief. Patient states that she no longer feel dizzy. Patient also wants to talk about finding a doctor to give her allergy shots.    Do you have an Advance Directive no  Do you want more information no    1. Have you been to the ER, urgent care clinic since your last visit?  Hospitalized since your last visit?Yes [place]    2. Have you seen or consulted any other health care providers outside of the [facility] since your last visit?  Include any pap smears or colon screening. No

## 2015-06-11 ENCOUNTER — Ambulatory Visit: Admit: 2015-06-11 | Discharge: 2015-06-11 | Payer: MEDICARE | Attending: Internal Medicine | Primary: Family Medicine

## 2015-06-11 DIAGNOSIS — G43711 Chronic migraine without aura, intractable, with status migrainosus: Secondary | ICD-10-CM

## 2015-06-11 MED ORDER — TOPIRAMATE 25 MG TAB
25 mg | ORAL_TABLET | ORAL | Status: DC
Start: 2015-06-11 — End: 2015-07-10

## 2015-06-11 NOTE — Progress Notes (Signed)
Patient is in the office today for a 1 week follow up.  Patient states she is having back and head pain 7/10.    Do you have an Advance Directive yes      1. Have you been to the ER, urgent care clinic since your last visit?  Hospitalized since your last visit?No    2. Have you seen or consulted any other health care providers outside of the [facility] since your last visit?  Include any pap smears or colon screening. No

## 2015-06-11 NOTE — Patient Instructions (Signed)
Headache: Care Instructions  Your Care Instructions     Headaches have many possible causes. Most headaches aren't a sign of a more serious problem, and they will get better on their own. Home treatment may help you feel better faster.  The doctor has checked you carefully, but problems can develop later. If you notice any problems or new symptoms, get medical treatment right away.  Follow-up care is a key part of your treatment and safety. Be sure to make and go to all appointments, and call your doctor if you are having problems. It's also a good idea to know your test results and keep a list of the medicines you take.  How can you care for yourself at home?  ?? Do not drive if you have taken a prescription pain medicine.  ?? Rest in a quiet, dark room until your headache is gone. Close your eyes and try to relax or go to sleep. Don't watch TV or read.  ?? Put a cold, moist cloth or cold pack on the painful area for 10 to 20 minutes at a time. Put a thin cloth between the cold pack and your skin.  ?? Use a warm, moist towel or a heating pad set on low to relax tight shoulder and neck muscles.  ?? Have someone gently massage your neck and shoulders.  ?? Take pain medicines exactly as directed.  ?? If the doctor gave you a prescription medicine for pain, take it as prescribed.  ?? If you are not taking a prescription pain medicine, ask your doctor if you can take an over-the-counter medicine.  ?? Be careful not to take pain medicine more often than the instructions allow, because you may get worse or more frequent headaches when the medicine wears off.  ?? Do not ignore new symptoms that occur with a headache, such as a fever, weakness or numbness, vision changes, or confusion. These may be signs of a more serious problem.  To prevent headaches  ?? Keep a headache diary so you can figure out what triggers your headaches. Avoiding triggers may help you prevent headaches. Record when  each headache began, how long it lasted, and what the pain was like (throbbing, aching, stabbing, or dull). Write down any other symptoms you had with the headache, such as nausea, flashing lights or dark spots, or sensitivity to bright light or loud noise. Note if the headache occurred near your period. List anything that might have triggered the headache, such as certain foods (chocolate, cheese, wine) or odors, smoke, bright light, stress, or lack of sleep.  ?? Find healthy ways to deal with stress. Headaches are most common during or right after stressful times. Take time to relax before and after you do something that has caused a headache in the past.  ?? Try to keep your muscles relaxed by keeping good posture. Check your jaw, face, neck, and shoulder muscles for tension, and try relaxing them. When sitting at a desk, change positions often, and stretch for 30 seconds each hour.  ?? Get plenty of sleep and exercise.  ?? Eat regularly and well. Long periods without food can trigger a headache.  ?? Treat yourself to a massage. Some people find that regular massages are very helpful in relieving tension.  ?? Limit caffeine by not drinking too much coffee, tea, or soda. But don't quit caffeine suddenly, because that can also give you headaches.  ?? Reduce eyestrain from computers by blinking frequently and looking away from   the computer screen every so often. Make sure you have proper eyewear and that your monitor is set up properly, about an arm's length away.  ?? Seek help if you have depression or anxiety. Your headaches may be linked to these conditions. Treatment can both prevent headaches and help with symptoms of anxiety or depression.  When should you call for help?  Call 911 anytime you think you may need emergency care. For example, call if:  ?? You have signs of a stroke. These may include:  ?? Sudden numbness, paralysis, or weakness in your face, arm, or leg, especially on only one side of your body.   ?? Sudden vision changes.  ?? Sudden trouble speaking.  ?? Sudden confusion or trouble understanding simple statements.  ?? Sudden problems with walking or balance.  ?? A sudden, severe headache that is different from past headaches.  Call your doctor now or seek immediate medical care if:  ?? You have a new or worse headache.  ?? Your headache gets much worse.  Where can you learn more?  Go to http://www.healthwise.net/GoodHelpConnections  Enter M271 in the search box to learn more about "Headache: Care Instructions."  ?? 2006-2016 [employer]. Care instructions adapted under license by [employer] (which disclaims liability or warranty for this information). This care instruction is for use with your licensed healthcare professional. If you have questions about a medical condition or this instruction, always ask your healthcare professional. [employer] disclaims any warranty or liability for your use of this information.  Content Version: 10.9.538570; Current as of: January 23, 2015

## 2015-06-12 ENCOUNTER — Inpatient Hospital Stay: Admit: 2015-06-12 | Payer: PRIVATE HEALTH INSURANCE | Attending: Internal Medicine | Primary: Family Medicine

## 2015-06-12 DIAGNOSIS — G4485 Primary stabbing headache: Secondary | ICD-10-CM

## 2015-06-12 NOTE — Progress Notes (Signed)
Quick Note:        Tell patient his/her CT of head was normal    ______

## 2015-06-12 NOTE — Progress Notes (Signed)
Message left on the VM for the patient to call back.

## 2015-06-15 NOTE — Progress Notes (Signed)
Quick Note:        Left message for patient to call the office.    ______

## 2015-06-15 NOTE — Progress Notes (Signed)
Patient is aware CT normal.  Patient verbalizes understanding.

## 2015-06-17 NOTE — Progress Notes (Signed)
Cynthia LevanFrankie Marsh is a 64 y.o.  female and presents with Headache      SUBJECTIVE:  Pt continues to c/o of chronic headaches associated with photophobia and nausea. CT of head was negative and she says percocet only decreases the pain for about 20 minutes.       Respiratory ROS: negative for - shortness of breath  Cardiovascular ROS: negative for - chest pain    Current Outpatient Prescriptions   Medication Sig   ??? topiramate (TOPAMAX) 25 mg tablet 1 tab daily for 1 week then BID   ??? mometasone (NASONEX) 50 mcg/actuation nasal spray 2 Sprays daily.   ??? albuterol (PROVENTIL HFA, VENTOLIN HFA, PROAIR HFA) 90 mcg/actuation inhaler Take  by inhalation.   ??? mirtazapine (REMERON) 30 mg tablet Take  by mouth nightly.   ??? calcium citrate-vitamin d3 (CITRACAL+D) 315-200 mg-unit tab Take 1 Tab by mouth daily (with breakfast).   ??? metFORMIN (GLUCOPHAGE) 1,000 mg tablet Take 1,000 mg by mouth two (2) times daily (with meals).   ??? busPIRone (BUSPAR) 15 mg tablet    ??? QVAR 80 mcg/actuation inhaler    ??? zolpidem (AMBIEN) 10 mg tablet    ??? amLODIPine (NORVASC) 10 mg tablet    ??? azelastine (ASTELIN) 137 mcg (0.1 %) nasal spray    ??? FLUoxetine (PROZAC) 40 mg capsule    ??? hydrochlorothiazide (HYDRODIURIL) 25 mg tablet    ??? ramipril (ALTACE) 5 mg capsule    ??? montelukast (SINGULAIR) 10 mg tablet    ??? ALPRAZolam (XANAX) 0.5 mg tablet TAKE 1 TABLET BY MOUTH TWICE DAILY. MAX DAILY AMOUNT: 1MG    ??? butalbital-acetaminophen-caffeine (FIORICET, ESGIC) 50-325-40 mg per tablet Take 1 Tab by mouth every four (4) hours as needed for Pain. Max Daily Amount: 6 Tabs.   ??? oxyCODONE-acetaminophen (PERCOCET 10) 10-325 mg per tablet Take 1 Tab by mouth every six (6) hours as needed for Pain. Max Daily Amount: 4 Tabs.   ??? meclizine (ANTIVERT) 25 mg tablet Take 1 Tab by mouth three (3) times daily as needed. Indications: VERTIGO   ??? HYDROcodone-acetaminophen (NORCO) 10-325 mg tablet Take 1 Tab by mouth  every eight (8) hours as needed for Pain. Max Daily Amount: 3 Tabs.   ??? albuterol-ipratropium (DUO-NEB) 2.5 mg-0.5 mg/3 ml nebu 3 mL by Nebulization route.   ??? oxybutynin chloride XL (DITROPAN XL) 10 mg CR tablet      No current facility-administered medications for this visit.         OBJECTIVE:  alert, well appearing, and in no distress  BP 106/70 mmHg   Pulse 71   Temp(Src) 98.1 ??F (36.7 ??C) (Oral)   Resp 20   Ht 5\' 4"  (1.626 m)   Wt 172 lb (78.019 kg)   BMI 29.51 kg/m2   SpO2 98%   well developed and well nourished            Assessment/Plan      ICD-10-CM ICD-9-CM    1. Intractable chronic migraine without aura and with status migrainosus G43.711 346.73 Will start on topiramate (TOPAMAX) 25 mg tablet and titrate up. If headache not improving will refer to neurology. Will use Fioricet prn for now     Follow-up Disposition:  Return if symptoms worsen or fail to improve.    Reviewed plan of care. Patient has provided input and agrees with goals.

## 2015-06-23 LAB — BENZODIAZEPINES, CONFIRM
ALPRAZOLAM: POSITIVE — AB
Alprazolam confirm: 925 ng/mL
BENZODIAZEPINES: POSITIVE ng/mL — AB
CLONAZEPAM: NEGATIVE
FLURAZEPAM: NEGATIVE
LORAZEPAM: NEGATIVE
MIDAZOLAM: NEGATIVE
NORDIAZEPAM: NEGATIVE
OXAZEPAM: NEGATIVE
TEMAZEPAM: NEGATIVE
TRIAZOLAM: NEGATIVE

## 2015-07-06 NOTE — Telephone Encounter (Signed)
Requested Prescriptions     Pending Prescriptions Disp Refills   ??? metFORMIN (GLUCOPHAGE) 1,000 mg tablet       Sig: Take 1 Tab by mouth two (2) times daily (with meals).

## 2015-07-07 MED ORDER — METFORMIN 1,000 MG TAB
1000 mg | ORAL_TABLET | ORAL | Status: DC
Start: 2015-07-07 — End: 2016-01-11

## 2015-07-07 MED ORDER — METFORMIN 1,000 MG TAB
1000 mg | ORAL_TABLET | Freq: Two times a day (BID) | ORAL | Status: DC
Start: 2015-07-07 — End: 2015-07-07

## 2015-07-10 ENCOUNTER — Ambulatory Visit: Admit: 2015-07-10 | Discharge: 2015-07-10 | Payer: MEDICARE | Attending: Internal Medicine | Primary: Family Medicine

## 2015-07-10 DIAGNOSIS — G43711 Chronic migraine without aura, intractable, with status migrainosus: Secondary | ICD-10-CM

## 2015-07-10 MED ORDER — HYDROCODONE-ACETAMINOPHEN 10 MG-325 MG TAB
10-325 mg | ORAL_TABLET | Freq: Three times a day (TID) | ORAL | Status: DC | PRN
Start: 2015-07-10 — End: 2015-08-06

## 2015-07-10 MED ORDER — LANCETS
PACK | Status: DC
Start: 2015-07-10 — End: 2015-07-10

## 2015-07-10 MED ORDER — FREESTYLE LANCETS 28 GAUGE
28 gauge | Status: DC
Start: 2015-07-10 — End: 2016-01-11

## 2015-07-10 MED ORDER — MOMETASONE 50 MCG/ACTUATION NASAL SPRAY
50 mcg/actuation | Freq: Every day | NASAL | Status: DC
Start: 2015-07-10 — End: 2016-01-11

## 2015-07-10 MED ORDER — TOPIRAMATE 25 MG TAB
25 mg | ORAL_TABLET | ORAL | Status: DC
Start: 2015-07-10 — End: 2016-01-11

## 2015-07-10 MED ORDER — ALPRAZOLAM 0.5 MG TAB
0.5 mg | ORAL_TABLET | ORAL | Status: DC
Start: 2015-07-10 — End: 2015-08-06

## 2015-07-10 MED ORDER — CODEINE-GUAIFENESIN 10 MG-100 MG/5 ML ORAL LIQUID
100-10 mg/5 mL | Freq: Four times a day (QID) | ORAL | Status: DC | PRN
Start: 2015-07-10 — End: 2015-08-06

## 2015-07-10 MED ORDER — PREDNISONE 10 MG TABLETS IN A DOSE PACK
10 mg | ORAL_TABLET | ORAL | Status: DC
Start: 2015-07-10 — End: 2015-08-06

## 2015-07-10 MED ORDER — FREESTYLE LITE STRIPS
ORAL_STRIP | Status: DC
Start: 2015-07-10 — End: 2016-01-11

## 2015-07-10 MED ORDER — BLOOD SUGAR DIAGNOSTIC TEST STRIPS
ORAL_STRIP | Status: DC
Start: 2015-07-10 — End: 2015-07-10

## 2015-07-10 NOTE — Patient Instructions (Signed)
Cough: Care Instructions  Your Care Instructions  A cough is your body's response to something that bothers your throat or airways. Many things can cause a cough. You might cough because of a cold or the flu, bronchitis, or asthma. Smoking, postnasal drip, allergies, and stomach acid that backs up into your throat also can cause coughs.  A cough is a symptom, not a disease. Most coughs stop when the cause, such as a cold, goes away. You can take a few steps at home to cough less and feel better.  Follow-up care is a key part of your treatment and safety. Be sure to make and go to all appointments, and call your doctor if you are having problems. It's also a good idea to know your test results and keep a list of the medicines you take.  How can you care for yourself at home?  ?? Drink lots of water and other fluids. This helps thin the mucus and soothes a dry or sore throat. Honey or lemon juice in hot water or tea may ease a dry cough.  ?? Take cough medicine as directed by your doctor.  ?? Prop up your head on pillows to help you breathe and ease a dry cough.  ?? Try cough drops to soothe a dry or sore throat. Cough drops don't stop a cough. Medicine-flavored cough drops are no better than candy-flavored drops or hard candy.  ?? Do not smoke. Avoid secondhand smoke. If you need help quitting, talk to your doctor about stop-smoking programs and medicines. These can increase your chances of quitting for good.  When should you call for help?  Call 911 anytime you think you may need emergency care. For example, call if:  ?? You have severe trouble breathing.  Call your doctor now or seek immediate medical care if:  ?? You cough up blood.  ?? You have new or worse trouble breathing.  ?? You have a new or higher fever.  ?? You have a new rash.  Watch closely for changes in your health, and be sure to contact your doctor if:  ?? You cough more deeply or more often, especially if you notice more mucus  or a change in the color of your mucus.  ?? You have new symptoms, such as a sore throat, an earache, or sinus pain.  ?? You do not get better as expected.  Where can you learn more?  Go to http://www.healthwise.net/GoodHelpConnections  Enter D279 in the search box to learn more about "Cough: Care Instructions."  ?? 2006-2016 [employer]. Care instructions adapted under license by [employer] (which disclaims liability or warranty for this information). This care instruction is for use with your licensed healthcare professional. If you have questions about a medical condition or this instruction, always ask your healthcare professional. Healthwise, Incorporated disclaims any warranty or liability for your use of this information.  Content Version: 10.9.538570; Current as of: July 25, 2014        Cough: Care Instructions  Your Care Instructions  A cough is your body's response to something that bothers your throat or airways. Many things can cause a cough. You might cough because of a cold or the flu, bronchitis, or asthma. Smoking, postnasal drip, allergies, and stomach acid that backs up into your throat also can cause coughs.  A cough is a symptom, not a disease. Most coughs stop when the cause, such as a cold, goes away. You can take a few steps at   home to cough less and feel better.  Follow-up care is a key part of your treatment and safety. Be sure to make and go to all appointments, and call your doctor if you are having problems. It's also a good idea to know your test results and keep a list of the medicines you take.  How can you care for yourself at home?  ?? Drink lots of water and other fluids. This helps thin the mucus and soothes a dry or sore throat. Honey or lemon juice in hot water or tea may ease a dry cough.  ?? Take cough medicine as directed by your doctor.  ?? Prop up your head on pillows to help you breathe and ease a dry cough.   ?? Try cough drops to soothe a dry or sore throat. Cough drops don't stop a cough. Medicine-flavored cough drops are no better than candy-flavored drops or hard candy.  ?? Do not smoke. Avoid secondhand smoke. If you need help quitting, talk to your doctor about stop-smoking programs and medicines. These can increase your chances of quitting for good.  When should you call for help?  Call 911 anytime you think you may need emergency care. For example, call if:  ?? You have severe trouble breathing.  Call your doctor now or seek immediate medical care if:  ?? You cough up blood.  ?? You have new or worse trouble breathing.  ?? You have a new or higher fever.  ?? You have a new rash.  Watch closely for changes in your health, and be sure to contact your doctor if:  ?? You cough more deeply or more often, especially if you notice more mucus or a change in the color of your mucus.  ?? You have new symptoms, such as a sore throat, an earache, or sinus pain.  ?? You do not get better as expected.  Where can you learn more?  Go to http://www.healthwise.net/GoodHelpConnections  Enter D279 in the search box to learn more about "Cough: Care Instructions."  ?? 2006-2016 [employer]. Care instructions adapted under license by [employer] (which disclaims liability or warranty for this information). This care instruction is for use with your licensed healthcare professional. If you have questions about a medical condition or this instruction, always ask your healthcare professional. Healthwise, Inc[employer] disclaims any warranty or liability for your use of this information.  Content Version: 10.9.538570; Current as of: July 25, 2014

## 2015-07-10 NOTE — Progress Notes (Signed)
Patient is in the office today for a medication refill.  Patient also states she has been coughing and she had a sharp pain in her back yesterday morning while washing dishes.    Do you have an Advance Directive yes      1. Have you been to the ER, urgent care clinic since your last visit?  Hospitalized since your last visit?No    2. Have you seen or consulted any other health care providers outside of the [facility] since your last visit?  Include any pap smears or colon screening. No

## 2015-07-10 NOTE — Progress Notes (Signed)
Cynthia Marsh is a 63 y.o.  female and presents with Medication Refill; Cough; and Head Pain      SUBJECTIVE:  Pt says her headaches are much improved on Topamax and she now rarely has headaches. She no longer has to use Fioricet. She still c/o chronic fatigue and now cough for the last few days she believes is due to allergies. She had a CXR about 6 weeks ago that was normal. Pt says when she coughed today her upper back began to hurt. She has been using her albuterol inhaler daily even with having Qvar in the last week. She denies any wheezing.       Respiratory ROS: negative for - shortness of breath  Cardiovascular ROS: negative for - chest pain    Current Outpatient Prescriptions   Medication Sig   ??? Lancets misc by Does Not Apply route.   ??? topiramate (TOPAMAX) 25 mg tablet 1 tab daily for 1 week then BID   ??? HYDROcodone-acetaminophen (NORCO) 10-325 mg tablet Take 1 Tab by mouth every eight (8) hours as needed for Pain. Max Daily Amount: 3 Tabs.   ??? ALPRAZolam (XANAX) 0.5 mg tablet TAKE 1 TABLET BY MOUTH TWICE DAILY. MAX DAILY AMOUNT:    ??? mometasone (NASONEX) 50 mcg/actuation nasal spray 2 Sprays by Both Nostrils route daily.   ??? Lancets (FREESTYLE LANCETS) misc Check blood sugar twice daily. DX E11.9   ??? glucose blood VI test strips (FREESTYLE LITE STRIPS) strip Check blood sugar twice daily. DX E11.9   ??? guaiFENesin-codeine (CHERATUSSIN AC) 100-10 mg/5 mL solution Take 10 mL by mouth four (4) times daily as needed for Cough. Max Daily Amount: 40 mL.   ??? predniSONE (STERAPRED DS) 10 mg dose pack Take 1 Tab by mouth See Admin Instructions.   ??? metFORMIN (GLUCOPHAGE) 1,000 mg tablet TAKE 1 TABLET BY MOUTH TWICE DAILY WITH MEALS   ??? mirtazapine (REMERON) 30 mg tablet Take  by mouth nightly.   ??? calcium citrate-vitamin d3 (CITRACAL+D) 315-200 mg-unit tab Take 1 Tab by mouth daily (with breakfast).   ??? busPIRone (BUSPAR) 15 mg tablet    ??? oxybutynin chloride XL (DITROPAN XL) 10 mg CR tablet     ??? QVAR 80 mcg/actuation inhaler    ??? zolpidem (AMBIEN) 10 mg tablet    ??? amLODIPine (NORVASC) 10 mg tablet    ??? azelastine (ASTELIN) 137 mcg (0.1 %) nasal spray    ??? FLUoxetine (PROZAC) 40 mg capsule    ??? hydrochlorothiazide (HYDRODIURIL) 25 mg tablet    ??? ramipril (ALTACE) 5 mg capsule    ??? montelukast (SINGULAIR) 10 mg tablet    ??? butalbital-acetaminophen-caffeine (FIORICET, ESGIC) 50-325-40 mg per tablet Take 1 Tab by mouth every four (4) hours as needed for Pain. Max Daily Amount: 6 Tabs.   ??? meclizine (ANTIVERT) 25 mg tablet Take 1 Tab by mouth three (3) times daily as needed. Indications: VERTIGO   ??? albuterol (PROVENTIL HFA, VENTOLIN HFA, PROAIR HFA) 90 mcg/actuation inhaler Take  by inhalation.   ??? albuterol-ipratropium (DUO-NEB) 2.5 mg-0.5 mg/3 ml nebu 3 mL by Nebulization route.     No current facility-administered medications for this visit.         OBJECTIVE:  oriented to person, place, and time and chronically ill appearing  BP 108/70 mmHg   Pulse 84   Temp(Src) 98.5 ??F (36.9 ??C) (Oral)   Resp 20   Ht  (1.626 m)   Wt 166 lb (75.297 kg)   BMI  28.48 kg/m2   SpO2 97%   well developed and well nourished  Chest - clear to auscultation, no wheezes, rales or rhonchi, symmetric air entry  Heart - normal rate, regular rhythm, normal S1, S2, no murmurs, rubs, clicks or gallops  Extremities - peripheral pulses normal, no pedal edema, no clubbing or cyanosis    Labs:   Lab Results   Component Value Date/Time    WBC 5.8 06/02/2015 01:10 PM    HGB 12.2 06/02/2015 01:10 PM    HCT 38.5 06/02/2015 01:10 PM    PLATELET 285 06/02/2015 01:10 PM    MCV 86.1 06/02/2015 01:10 PM       Lab Results   Component Value Date/Time    GFR EST AA >60 06/02/2015 01:10 PM    GFR EST NON-AA >60 06/02/2015 01:10 PM    CREATININE 0.72 06/02/2015 01:10 PM    BUN 17 06/02/2015 01:10 PM    SODIUM 139 06/02/2015 01:10 PM    POTASSIUM 3.5 06/02/2015 01:10 PM    CHLORIDE 103 06/02/2015 01:10 PM    CO2 31 06/02/2015 01:10 PM       Lab Results   Component Value Date/Time    TSH 0.77 06/02/2015 01:10 PM        Assessment/Plan      ICD-10-CM ICD-9-CM    1. Intractable chronic migraine without aura and with status migrainosus G43.711 346.73 Improved on topiramate (TOPAMAX) 25 mg tablet   2. Mild intermittent asthma without complication J45.20 493.90 Will treat with predniSONE (STERAPRED DS) 10 mg dose pack for probable mild exacerbation due to allergies    3. Cough R05 786.2 Will treat with guaiFENesin-codeine (CHERATUSSIN AC) 100-10 mg/5 mL solution. Pt not to take with Xanax and Norco      Follow-up Disposition:  Return if symptoms worsen or fail to improve.    Reviewed plan of care. Patient has provided input and agrees with goals.

## 2015-07-27 ENCOUNTER — Inpatient Hospital Stay
Admit: 2015-07-27 | Discharge: 2015-07-27 | Disposition: A | Discharge status: Home / Self Care | Payer: PRIVATE HEALTH INSURANCE | Attending: Emergency Medicine

## 2015-07-27 ENCOUNTER — Encounter: Attending: Internal Medicine | Primary: Family Medicine

## 2015-07-27 DIAGNOSIS — M25561 Pain in right knee: Secondary | ICD-10-CM

## 2015-07-27 NOTE — ED Triage Notes (Signed)
Pt reports R knee/ R leg pain started yesterday

## 2015-07-27 NOTE — ED Provider Notes (Signed)
HPI Comments: 11:09 AM Cynthia Marsh is a 64 y.o. female who presents to the ED c/o R knee pain. Pt notes the pain radiates from her knee down to her ankle. Pt states that she had knee surgery for a torn meniscus and now needs a total knee replacement. pt states that Dr. Meyer Cory is unable to see her today,  he normally gives her a shot. Pt has Hydrocodone and Naproxen prescriptions. Pt denies numbness and tingling in the R leg, being on blood thinners, any new trauma or injury. Pt has no other sx or complaints.      The history is provided by the patient.        Past Medical History:   Diagnosis Date   ??? Anxiety    ??? Asthma    ??? Chronic kidney disease    ??? Depression    ??? Diabetes (HCC)    ??? Hypertension    ??? Insomnia    ??? Osteoporosis        Past Surgical History:   Procedure Laterality Date   ??? Pr anesth,surgery of shoulder     ??? Pr hand/finger surgery unlisted     ??? Pr laminectomy,cervical     ??? Hx cyst removal       calf    ??? Hx gyn       complete hyst    ??? Hx gyn       tubal ligation   ??? Hx wrist fracture tx     ??? Hx back surgery     ??? Hx orthopaedic       Right shoulder rotator cuff    ??? Hx orthopaedic       Left Little toe hammer toe, left big toe bunion removed   ??? Hx orthopaedic       Right knee torn meniscus   ??? Hx orthopaedic       CTS bilateral , Left thumb trigger finger    ??? Pr breast surgery procedure unlisted        Left cyst removed          Family History:   Problem Relation Age of Onset   ??? Diabetes Mother    ??? Diabetes Brother        Social History     Social History   ??? Marital status: SINGLE     Spouse name: N/A   ??? Number of children: N/A   ??? Years of education: N/A     Occupational History   ??? Not on file.     Social History Main Topics   ??? Smoking status: Former Smoker   ??? Smokeless tobacco: Never Used   ??? Alcohol use Yes      Comment: on occasion   ??? Drug use: Not on file   ??? Sexual activity: Not on file     Other Topics Concern   ??? Not on file     Social History Narrative          ALLERGIES: Castor oil; Cozaar [losartan]; and Crestor [rosuvastatin]    Review of Systems   Musculoskeletal: Positive for arthralgias (R knee to ankle ).   Neurological: Negative for numbness.   All other systems reviewed and are negative.      Vitals:    07/27/15 1057   BP: 132/83   Pulse: 76   Resp: 20   Temp: 97.4 ??F (36.3 ??C)   SpO2: 99%   Weight: 73.5 kg (162 lb)  Height: 5\' 4"  (1.626 m)            Physical Exam   Constitutional: She is oriented to person, place, and time. She appears well-developed and well-nourished.   HENT:   Head: Normocephalic and atraumatic.   Neck: Neck supple. No JVD present.   Musculoskeletal: She exhibits no edema.   R knee: mild effusion, lateral aspect, passive ROM intact  No erythema or warmth  Pain with flexion and extension of knee  R dorsi/plantar flexion 5/5  Gross sensation intact BLE  Calf compartment soft.    Neurological: She is alert and oriented to person, place, and time.   Skin: Skin is warm and dry. No erythema.        MDM  Number of Diagnoses or Management Options  Chronic pain of right knee:   Diagnosis management comments: 64 y/o female presents with R knee pain. Hx of arthritis, torn meniscus, had recent long period of time sitting which she thinks exacerbated her sxs. Last cortisone shot 6 months ago. Slight effusion, no sign of septic joint, no recent trauma, no indication for labs or imaging.     advised to resume naproxen, will need follow up with Dr. Meyer Cory. No further narcotic scrips, pt on hydrocodone at this time  Continue use of knee brace.     ED Course       Procedures      Vitals:  Patient Vitals for the past 12 hrs:   Temp Pulse Resp BP SpO2   07/27/15 1057 97.4 ??F (36.3 ??C) 76 20 132/83 99 %       Medications ordered:   Medications - No data to display      Lab findings:  No results found for this or any previous visit (from the past 12 hour(s)).      Evaluation of patient:   I discussed the plan for discharge and out patient follow up. Patient  agrees and understands. Patient is discharged in stable condition.     Disposition:  Diagnosis:   1. Chronic pain of right knee        Disposition: Discharge    Follow-up Information     Follow up With Details Comments Contact Info    Gifford Shave, MD Schedule an appointment as soon as possible for a visit in 2 days for re-evaluation and further treatment [address]  [address]  [facility] [place] [address]  [phone]      Marvia Pickles, MD Schedule an appointment as soon as possible for a visit in 2 days for re-evaluation and further treatment [address] [place] [address]  [phone]      [facility] EMERGENCY DEPT  As needed, If symptoms worsen [address] [place] [address]  [phone]           Patient's Medications   Start Taking    No medications on file   Continue Taking    ALBUTEROL (PROVENTIL HFA, VENTOLIN HFA, PROAIR HFA) 90 MCG/ACTUATION INHALER    Take  by inhalation.    ALBUTEROL-IPRATROPIUM (DUO-NEB) 2.5 MG-0.5 MG/3 ML NEBU    3 mL by Nebulization route.    ALPRAZOLAM (XANAX) 0.5 MG TABLET    TAKE 1 TABLET BY MOUTH TWICE DAILY. MAX DAILY AMOUNT: 1MG     AMLODIPINE (NORVASC) 10 MG TABLET        AZELASTINE (ASTELIN) 137 MCG (0.1 %) NASAL SPRAY  BUSPIRONE (BUSPAR) 15 MG TABLET        BUTALBITAL-ACETAMINOPHEN-CAFFEINE (FIORICET, ESGIC) 50-325-40 MG PER TABLET    Take 1 Tab by mouth every four (4) hours as needed for Pain. Max Daily Amount: 6 Tabs.    CALCIUM CITRATE-VITAMIN D3 (CITRACAL+D) 315-200 MG-UNIT TAB    Take 1 Tab by mouth daily (with breakfast).    FLUOXETINE (PROZAC) 40 MG CAPSULE        FREESTYLE LANCETS 28 GAUGE MISC    CHECK BLOOD SUGAR TWICE DAILY    FREESTYLE LITE STRIPS STRIP    USE TO CHECK BLOOD SUGAR TWICE DAILY    GUAIFENESIN-CODEINE (CHERATUSSIN AC) 100-10 MG/5 ML SOLUTION    Take 10 mL by mouth four (4) times daily as needed for Cough. Max Daily Amount: 40 mL.    HYDROCHLOROTHIAZIDE (HYDRODIURIL) 25 MG TABLET         HYDROCODONE-ACETAMINOPHEN (NORCO) 10-325 MG TABLET    Take 1 Tab by mouth every eight (8) hours as needed for Pain. Max Daily Amount: 3 Tabs.    LANCETS MISC    by Does Not Apply route.    MECLIZINE (ANTIVERT) 25 MG TABLET    Take 1 Tab by mouth three (3) times daily as needed. Indications: VERTIGO    METFORMIN (GLUCOPHAGE) 1,000 MG TABLET    TAKE 1 TABLET BY MOUTH TWICE DAILY WITH MEALS    MIRTAZAPINE (REMERON) 30 MG TABLET    Take  by mouth nightly.    MOMETASONE (NASONEX) 50 MCG/ACTUATION NASAL SPRAY    2 Sprays by Both Nostrils route daily.    MONTELUKAST (SINGULAIR) 10 MG TABLET        OXYBUTYNIN CHLORIDE XL (DITROPAN XL) 10 MG CR TABLET        PREDNISONE (STERAPRED DS) 10 MG DOSE PACK    Take 1 Tab by mouth See Admin Instructions.    QVAR 80 MCG/ACTUATION INHALER        RAMIPRIL (ALTACE) 5 MG CAPSULE        TOPIRAMATE (TOPAMAX) 25 MG TABLET    1 tab daily for 1 week then BID    ZOLPIDEM (AMBIEN) 10 MG TABLET       These Medications have changed    No medications on file   Stop Taking    No medications on file         Scribe Attestation:   I, Brayton El, am scribing for and in the presence of Laurin Coder, DO on this day 07/27/15 at 11:09 AM   Brayton El, scribe    Provider Attestation:  I personally performed the services described in the documentation, reviewed the documentation, as recorded by the scribe in my presence, and it accurately and completely records my words and actions.  Laurin Coder, DO. 11:09 AM

## 2015-07-27 NOTE — ED Notes (Signed)
I have reviewed discharge instructions with the patient.  The patient verbalized understanding.  Patient armband removed and shredded

## 2015-07-27 NOTE — Telephone Encounter (Signed)
Pt calling regarding her right knee & leg swollen and painful x 1 day. She said has had surgery on torn meniscus x 2 years ago    Her ortho doctor is Dr Meyer Cory, who is in surgery today. His office advised her to go to the ED as they could not offer her an appointment until 08/07/2015    She went to [facility] ED today 07/27/2015. She said they do not do cortisone shots.    She is trying to find somewhere where she can get a cortisone shot asap.    Please advise

## 2015-07-27 NOTE — Telephone Encounter (Signed)
Dr Delford Field can do Cortisone injections so you can schedule pt with her for tomorrow but make sure she knows she has to be evaluated to see if the cortisone injection is indicated.

## 2015-07-28 NOTE — Telephone Encounter (Signed)
Pt was given message below and stated that she would just be in pain because she's called people and no one will do the injections for her.

## 2015-07-28 NOTE — Telephone Encounter (Signed)
No cortisone injections available in the office at this time. Patient may need to contact her orthopedics physician regarding the cortisone injection.  I called the patient to inform her of this but there was no answer. So I left her a message requesting that she call me back.

## 2015-07-29 ENCOUNTER — Other Ambulatory Visit: Admit: 2015-07-29 | Discharge: 2015-07-29 | Payer: MEDICARE | Attending: Internal Medicine | Primary: Family Medicine

## 2015-07-29 DIAGNOSIS — E119 Type 2 diabetes mellitus without complications: Secondary | ICD-10-CM

## 2015-07-30 ENCOUNTER — Inpatient Hospital Stay: Admit: 2015-07-30 | Payer: PRIVATE HEALTH INSURANCE | Primary: Family Medicine

## 2015-07-30 LAB — METABOLIC PANEL, COMPREHENSIVE
A-G Ratio: 1.8 (ref 1.1–2.5)
ALT (SGPT): 30 IU/L (ref 0–32)
AST (SGOT): 23 IU/L (ref 0–40)
Albumin: 4.2 g/dL (ref 3.6–4.8)
Alk. phosphatase: 44 IU/L (ref 39–117)
BUN/Creatinine ratio: 22 (ref 11–26)
BUN: 16 mg/dL (ref 8–27)
Bilirubin, total: 0.2 mg/dL (ref 0.0–1.2)
CO2: 28 mmol/L (ref 18–29)
Calcium: 9.5 mg/dL (ref 8.7–10.3)
Chloride: 102 mmol/L (ref 97–108)
Creatinine: 0.73 mg/dL (ref 0.57–1.00)
GFR est AA: 101 mL/min/{1.73_m2} (ref >59)
GFR est non-AA: 88 mL/min/{1.73_m2} (ref >59)
GLOBULIN, TOTAL: 2.3 g/dL (ref 1.5–4.5)
Glucose: 100 mg/dL — ABNORMAL HIGH (ref 65–99)
Potassium: 3.9 mmol/L (ref 3.5–5.2)
Protein, total: 6.5 g/dL (ref 6.0–8.5)
Sodium: 143 mmol/L (ref 134–144)

## 2015-07-30 LAB — HEMOGLOBIN A1C W/O EAG: Hemoglobin A1c: 6.9 % — ABNORMAL HIGH (ref 4.8–5.6)

## 2015-07-30 LAB — LIPID PANEL
Cholesterol, total: 176 mg/dL (ref 100–199)
HDL Cholesterol: 46 mg/dL (ref >39)
LDL, calculated: 111 mg/dL — ABNORMAL HIGH (ref 0–99)
Triglyceride: 96 mg/dL (ref 0–149)
VLDL, calculated: 19 mg/dL (ref 5–40)

## 2015-07-30 LAB — CVD REPORT

## 2015-07-30 NOTE — Telephone Encounter (Signed)
Pt received message below, but however are we able to do cortisone injections?

## 2015-07-30 NOTE — Telephone Encounter (Signed)
Yes, Dr. Delford Field does Cortisone injections.  However make sure supplies are in office before scheduling appointment.

## 2015-07-30 NOTE — Telephone Encounter (Signed)
Left message for patient we have supplies in the office and if patient would like to have injection at the office please call and schedule an appointment.  Any questions call the office.

## 2015-08-03 ENCOUNTER — Encounter: Attending: Internal Medicine | Primary: Family Medicine

## 2015-08-06 ENCOUNTER — Ambulatory Visit: Admit: 2015-08-06 | Discharge: 2015-08-06 | Payer: MEDICARE | Attending: Internal Medicine | Primary: Family Medicine

## 2015-08-06 DIAGNOSIS — E119 Type 2 diabetes mellitus without complications: Secondary | ICD-10-CM

## 2015-08-06 MED ORDER — OXYBUTYNIN CHLORIDE SR 10 MG 24 HR TAB
10 mg | ORAL_TABLET | Freq: Every day | ORAL | 4 refills | Status: DC
Start: 2015-08-06 — End: 2015-12-10

## 2015-08-06 MED ORDER — ALPRAZOLAM 0.5 MG TAB
0.5 mg | ORAL_TABLET | ORAL | 2 refills | Status: DC
Start: 2015-08-06 — End: 2015-10-06

## 2015-08-06 MED ORDER — PRAVASTATIN 40 MG TAB
40 mg | ORAL_TABLET | Freq: Every evening | ORAL | 3 refills | Status: DC
Start: 2015-08-06 — End: 2016-01-11

## 2015-08-06 MED ORDER — OXYCODONE-ACETAMINOPHEN 10 MG-325 MG TAB
10-325 mg | ORAL_TABLET | Freq: Four times a day (QID) | ORAL | 0 refills | Status: DC | PRN
Start: 2015-08-06 — End: 2015-09-02

## 2015-08-06 NOTE — Patient Instructions (Signed)
Back Pain: Care Instructions  Your Care Instructions     Back pain has many possible causes. It is often related to problems with muscles and ligaments of the back. It may also be related to problems with the nerves, discs, or bones of the back. Moving, lifting, standing, sitting, or sleeping in an awkward way can strain the back. Sometimes you don't notice the injury until later. Arthritis is another common cause of back pain.  Although it may hurt a lot, back pain usually improves on its own within several weeks. Most people recover in 12 weeks or less. Using good home treatment and being careful not to stress your back can help you feel better sooner.  Follow-up care is a key part of your treatment and safety. Be sure to make and go to all appointments, and call your doctor if you are having problems. It???s also a good idea to know your test results and keep a list of the medicines you take.  How can you care for yourself at home?  ?? Sit or lie in positions that are most comfortable and reduce your pain. Try one of these positions when you lie down:  ?? Lie on your back with your knees bent and supported by large pillows.  ?? Lie on the floor with your legs on the seat of a sofa or chair.  ?? Lie on your side with your knees and hips bent and a pillow between your legs.  ?? Lie on your stomach if it does not make pain worse.  ?? Do not sit up in bed, and avoid soft couches and twisted positions. Bed rest can help relieve pain at first, but it delays healing. Avoid bed rest after the first day of back pain.  ?? Change positions every 30 minutes. If you must sit for long periods of time, take breaks from sitting. Get up and walk around, or lie in a comfortable position.  ?? Try using a heating pad on a low or medium setting for 15 to 20 minutes every 2 or 3 hours. Try a warm shower in place of one session with the heating pad.  ?? You can also try an ice pack for 10 to 15 minutes every 2 to 3 hours.  Put a thin cloth between the ice pack and your skin.  ?? Take pain medicines exactly as directed.  ?? If the doctor gave you a prescription medicine for pain, take it as prescribed.  ?? If you are not taking a prescription pain medicine, ask your doctor if you can take an over-the-counter medicine.  ?? Take short walks several times a day. You can start with 5 to 10 minutes, 3 or 4 times a day, and work up to longer walks. Walk on level surfaces and avoid hills and stairs until your back is better.  ?? Return to work and other activities as soon as you can. Continued rest without activity is usually not good for your back.  ?? To prevent future back pain, do exercises to stretch and strengthen your back and stomach. Learn how to use good posture, safe lifting techniques, and proper body mechanics.  When should you call for help?  Call your doctor now or seek immediate medical care if:  ?? You have new or worsening numbness in your legs.  ?? You have new or worsening weakness in your legs. (This could make it hard to stand up.)  ?? You lose control of your bladder or bowels.    Watch closely for changes in your health, and be sure to contact your doctor if:  ?? Your pain gets worse.  ?? You are not getting better after 2 weeks.  Where can you learn more?  Go to http://www.healthwise.net/[employer]  Enter I594 in the search box to learn more about "Back Pain: Care Instructions."  ?? 2006-2016 [employer]. Care instructions adapted under license by [employer] (which disclaims liability or warranty for this information). This care instruction is for use with your licensed healthcare professional. If you have questions about a medical condition or this instruction, always ask your healthcare professional. Healthwise, Incorporated disclaims any warranty or liability for your use of this information.  Content Version: 10.9.538570; Current as of: Apr 25, 2014

## 2015-08-06 NOTE — Progress Notes (Signed)
Patient is in the office today for a 3 month follow up.  Patient states she is having back pain 10/10, and states hydrocodone is not helping.  Patient states she went to the ED for her knee pain.  Patient states she has been having chest pain but thinks it is due to anxiety due to her being stuck in [place] trying to get a bus back to [place].  Patient states her blood presusre has been running low as well.    Do you have an Advance Directive yes      1. Have you been to the ER, urgent care clinic since your last visit?  Hospitalized since your last visit?yes, [facility]    2. Have you seen or consulted any other health care providers outside of the [facility] since your last visit?  Include any pap smears or colon screening. No

## 2015-08-06 NOTE — Progress Notes (Signed)
Subjective:       Chief Complaint  The patient presents for follow up of diabetes, hypertension and high cholesterol.        HPI  Cynthia Marsh is a 64 y.o. female seen for follow up of diabetes.   Shealso has hypertension and hyperlipidemia. Diabetes well controlled, no significant medication side effects noted, on metformin, hypertension well controlled, no significant medication side effects noted, on current meds including dizziness, pt has had low BP but knows to hols BP meds if SBP<100, hyperlipidemia poorly controlled, off a statin.    Diet and Lifestyle: generally follows a low fat low cholesterol diet, follows a diabetic diet regularly, sedentary    Home BP Monitoring: is well controlled at home, ranging     Diabetic Review of Systems - home glucose monitoring: is performed sporadically, 1x/day.    Other symptoms and concerns: pt's migraine headaches are much improved on Topamax with pt no longer using Fioricet. Pt went on a bus trip to visit her family and had to wait on the bus when coming back. This exacerbated hr back pain and right knee arthritis. She had swelling of her right knee and is using brace now and will see orthopedics in a few weeks. Pt's back pain is not well controlled on NOrco and pt says she is barely functional. She had difficulty putting her clothes on this AM to come to her appt. Pt understands that Narcotics can be very addictive and they are not improving her function will have to stop them and refer her to a Pain Clinic. She is also at increased risk of complications since she takes Xanax    Pt is seen by [facility] psychiatry for anxiety but they do not prescribe Xanax.     Discussed the patient's BMI with her.  The BMI follow up plan is as follows: BMI is out of normal parameters and plan is as follows: I have counseled this patient on diet and exercise regimens.      Current Outpatient Prescriptions   Medication Sig    ??? ALPRAZolam (XANAX) 0.5 mg tablet TAKE 1 TABLET BY MOUTH TWICE DAILY. MAX DAILY AMOUNT: $RemoveBefor'1MG'[id number]$    ??? pravastatin (PRAVACHOL) 40 mg tablet Take 1 Tab by mouth nightly.   ??? oxybutynin chloride XL (DITROPAN XL) 10 mg CR tablet Take 1 Tab by mouth daily.   ??? oxyCODONE-acetaminophen (PERCOCET 10) 10-325 mg per tablet Take 1 Tab by mouth every six (6) hours as needed for Pain. Max Daily Amount: 4 Tabs.   ??? Lancets misc by Does Not Apply route.   ??? topiramate (TOPAMAX) 25 mg tablet 1 tab daily for 1 week then BID   ??? FREESTYLE LITE STRIPS strip USE TO CHECK BLOOD SUGAR TWICE DAILY   ??? FREESTYLE LANCETS 28 gauge misc CHECK BLOOD SUGAR TWICE DAILY   ??? metFORMIN (GLUCOPHAGE) 1,000 mg tablet TAKE 1 TABLET BY MOUTH TWICE DAILY WITH MEALS   ??? mirtazapine (REMERON) 30 mg tablet Take  by mouth nightly.   ??? calcium citrate-vitamin d3 (CITRACAL+D) 315-200 mg-unit tab Take 1 Tab by mouth daily (with breakfast).   ??? busPIRone (BUSPAR) 15 mg tablet    ??? QVAR 80 mcg/actuation inhaler    ??? zolpidem (AMBIEN) 10 mg tablet    ??? amLODIPine (NORVASC) 10 mg tablet    ??? azelastine (ASTELIN) 137 mcg (0.1 %) nasal spray    ??? FLUoxetine (PROZAC) 40 mg capsule    ??? hydrochlorothiazide (HYDRODIURIL) 25 mg tablet    ???  ramipril (ALTACE) 5 mg capsule    ??? montelukast (SINGULAIR) 10 mg tablet    ??? mometasone (NASONEX) 50 mcg/actuation nasal spray 2 Sprays by Both Nostrils route daily.   ??? meclizine (ANTIVERT) 25 mg tablet Take 1 Tab by mouth three (3) times daily as needed. Indications: VERTIGO   ??? albuterol (PROVENTIL HFA, VENTOLIN HFA, PROAIR HFA) 90 mcg/actuation inhaler Take  by inhalation.     No current facility-administered medications for this visit.              Review of Systems  Respiratory: negative for dyspnea on exertion  Cardiovascular: negative for chest pain    Objective:     Visit Vitals   ??? BP 104/80 (BP 1 Location: Left arm, BP Patient Position: Sitting)   ??? Pulse 74   ??? Temp 97.9 ??F (36.6 ??C) (Oral)   ??? Resp 20   ??? Ht 5\' 4"  (1.626 m)    ??? Wt 171 lb (77.6 kg)   ??? SpO2 98%   ??? BMI 29.35 kg/m2        Eyes - pupils equal and reactive, extraocular eye movements intact  Chest - clear to auscultation, no wheezes, rales or rhonchi, symmetric air entry  Heart - normal rate, regular rhythm, normal S1, S2, no murmurs, rubs, clicks or gallops  Musculoskeletal - right knee with mild swelling and brace in place      Labs:   Lab Results   Component Value Date/Time    HEMOGLOBIN A1C 6.9 07/29/2015 12:14 PM    HEMOGLOBIN A1C 6.5 04/20/2015 10:49 AM    GLUCOSE 100 07/29/2015 12:14 PM    GLUCOSE (POC) 119 06/02/2015 12:26 PM    MICROALBUMIN/CREAT RATIO (MG/G CREAT) 8 04/20/2015 10:49 AM    MICROALBUMIN,URINE RANDOM 1.60 04/20/2015 10:49 AM    LDL, CALCULATED 111 07/29/2015 12:14 PM    CREATININE 0.73 07/29/2015 12:14 PM      Lab Results   Component Value Date/Time    CHOLESTEROL, TOTAL 176 07/29/2015 12:14 PM    HDL CHOLESTEROL 46 07/29/2015 12:14 PM    LDL, CALCULATED 111 07/29/2015 12:14 PM    TRIGLYCERIDE 96 07/29/2015 12:14 PM    CHOL/HDL RATIO 3.5 04/20/2015 10:49 AM       Lab Results   Component Value Date/Time    ALT 30 07/29/2015 12:14 PM    AST 23 07/29/2015 12:14 PM    ALK. PHOSPHATASE 44 07/29/2015 12:14 PM    BILIRUBIN, TOTAL 0.2 07/29/2015 12:14 PM       Lab Results   Component Value Date/Time    GFR EST AA 101 07/29/2015 12:14 PM    GFR EST NON-AA 88 07/29/2015 12:14 PM    CREATININE 0.73 07/29/2015 12:14 PM    BUN 16 07/29/2015 12:14 PM    SODIUM 143 07/29/2015 12:14 PM    POTASSIUM 3.9 07/29/2015 12:14 PM    CHLORIDE 102 07/29/2015 12:14 PM    CO2 28 07/29/2015 12:14 PM            Assessment / Plan     Diabetes well controlled, no significant medication side effects noted  Hypertension well controlled, on current meds. Pt can hold BP meds if SBP<100  Hyperlipidemia poorly controlled, will start on Pravachol 40 mg QHS .      ICD-10-CM ICD-9-CM    1. Type 2 diabetes mellitus without complication (HCC) E11.9 250.00 HEMOGLOBIN A1C W/O EAG    2. Essential hypertension with goal blood pressure less than 140/90  Z61 096.0 METABOLIC PANEL, COMPREHENSIVE   3. High cholesterol E78.0 272.0 pravastatin (PRAVACHOL) 40 mg tablet      LIPID PANEL   4. Chronic bilateral low back pain without sciatica M54.5 724.2 REFERRAL TO PHYSICIAL MEDICINE REHAB      Will change to oxyCODONE-acetaminophen (PERCOCET 10) 10-325 mg per tablet to improve function which is getting around in home and doing chores around home   5. Intractable migraine without aura and without status migrainosus G43.019 346.11 Improved on Topamax             Diabetic issues reviewed with her: diabetic diet discussed in detail, and low cholesterol diet, weight control and daily exercise discussed.     Follow-up Disposition:  Return in about 3 months (around 11/05/2015) for labs 1 week before.      Reviewed plan of care. Patient has provided input and agrees with goals.

## 2015-08-17 ENCOUNTER — Ambulatory Visit: Attending: Physical Medicine & Rehabilitation | Primary: Family Medicine

## 2015-08-17 ENCOUNTER — Ambulatory Visit
Admit: 2015-08-17 | Discharge: 2015-08-25 | Payer: MEDICARE | Attending: Physical Medicine & Rehabilitation | Primary: Family Medicine

## 2015-08-17 DIAGNOSIS — M545 Low back pain, unspecified: Secondary | ICD-10-CM

## 2015-08-17 MED ORDER — CYCLOBENZAPRINE 10 MG TAB
10 mg | ORAL_TABLET | Freq: Three times a day (TID) | ORAL | 0 refills | Status: DC | PRN
Start: 2015-08-17 — End: 2015-09-07

## 2015-08-17 MED ORDER — KETOROLAC TROMETHAMINE 15 MG/ML INJECTION
15 mg/mL | Freq: Once | INTRAMUSCULAR | 0 refills | Status: AC
Start: 2015-08-17 — End: 2015-08-17

## 2015-08-17 NOTE — Progress Notes (Signed)
VORB ENTERED PER DR. Wilford Corner AS DOCUMENTED ON BLUE SHEET FOR MRI LUMBAR SPINE WITH AND WO CONTRAST FOR INCREASED LOW BACK PAIN AND HX SURGERY; RAISED TOILET SEAT; ROLLATOR; REFERRAL TO HOME HEALTH FOR ADL EVAL; LIFE ALERT-HIGH FALL RISK; TORADOL  IM X1 NOW.    After obtaining consent, and per orders of Dr. Wilford Corner, injection of Toradol  given by Cloria Spring, Judie Petit, LPN. Patient instructed to report any adverse reaction to me immediately. Pt tolerated well and without incident.

## 2015-08-17 NOTE — Patient Instructions (Addendum)
MyChart Activation    Thank you for requesting access to MyChart. Please follow the instructions below to securely access and download your online medical record. MyChart allows you to send messages to your doctor, view your test results, renew your prescriptions, schedule appointments, and more.    How Do I Sign Up?    1. In your internet browser, go to [email or web address]  2. Click on the First Time User? Click Here link in the Sign In box. You will be redirect to the New Member Sign Up page.  3. Enter your MyChart Access Code exactly as it appears below. You will not need to use this code after you???ve completed the sign-up process. If you do not sign up before the expiration date, you must request a new code.    MyChart Access Code: [email or web address]  Expires: 10/25/2015 11:26 AM (This is the date your MyChart access code will expire)    4. Enter the last four digits of your Social Security Number (xxxx) and Date of Birth (mm/dd/yyyy) as indicated and click Submit. You will be taken to the next sign-up page.  5. Create a MyChart ID. This will be your MyChart login ID and cannot be changed, so think of one that is secure and easy to remember.  6. Create a MyChart password. You can change your password at any time.  7. Enter your Password Reset Question and Answer. This can be used at a later time if you forget your password.   8. Enter your e-mail address. You will receive e-mail notification when new information is available in MyChart.  9. Click Sign Up. You can now view and download portions of your medical record.  10. Click the Download Summary menu link to download a portable copy of your medical information.    Additional Information    If you have questions, please visit the Frequently Asked Questions section of the MyChart website at [email or web address] Remember, MyChart is NOT to be used for urgent needs. For medical emergencies, dial 911.      Chronic Pain: Care Instructions   Your Care Instructions  Chronic pain is pain that lasts a long time (months or even years) and may or may not have a clear cause. It is different from acute pain, which usually does have a clear cause???like an injury or illness???and gets better over time. Chronic pain:  ?? Lasts over time but may vary from day to day.  ?? Does not go away despite efforts to end it.  ?? May disrupt your sleep and lead to fatigue.  ?? May cause depression or anxiety.  ?? May make your muscles tense, causing more pain.  ?? Can disrupt your work, hobbies, home life, and relationships with friends and family.  Chronic pain is a very real condition. It is not just in your head. Treatment can help and usually includes several methods used together, such as medicines, physical therapy, exercise, and other treatments. Learning how to relax and changing negative thought patterns can also help you cope.  Chronic pain is complex. Taking an active role in your treatment will help you better manage your pain. Tell your doctor if you have trouble dealing with your pain. You may have to try several things before you find what works best for you.  Follow-up care is a key part of your treatment and safety. Be sure to make and go to all appointments, and call your doctor if you are having problems. It???s also a good idea to  know your test results and keep a list of the medicines you take.  How can you care for yourself at home?  ?? Pace yourself. Break up large jobs into smaller tasks. Save harder tasks for days when you have less pain, or go back and forth between hard tasks and easier ones. Take rest breaks.  ?? Relax, and reduce stress. Relaxation techniques such as deep breathing or meditation can help.  ?? Keep moving. Gentle, daily exercise can help reduce pain over the long run. Try low- or no-impact exercises such as walking, swimming, and stationary biking. Do stretches to stay flexible.  ?? Try heat, cold packs, and massage.   ?? Get enough sleep. Chronic pain can make you tired and drain your energy. Talk with your doctor if you have trouble sleeping because of pain.  ?? Think positive. Your thoughts can affect your pain level. Do things that you enjoy to distract yourself when you have pain instead of focusing on the pain. See a movie, read a book, listen to music, or spend time with a friend.  ?? If you think you are depressed, talk to your doctor about treatment.  ?? Keep a daily pain diary. Record how your moods, thoughts, sleep patterns, activities, and medicine affect your pain. You may find that your pain is worse during or after certain activities or when you are feeling a certain emotion. Having a record of your pain can help you and your doctor find the best ways to treat your pain.  ?? Take pain medicines exactly as directed.  ?? If the doctor gave you a prescription medicine for pain, take it as prescribed.  ?? If you are not taking a prescription pain medicine, ask your doctor if you can take an over-the-counter medicine.  Reducing constipation caused by pain medicine  ?? Include fruits, vegetables, beans, and whole grains in your diet each day. These foods are high in fiber.  ?? Drink plenty of fluids, enough so that your urine is light yellow or clear like water. If you have kidney, heart, or liver disease and have to limit fluids, talk with your doctor before you increase the amount of fluids you drink.  ?? If your doctor recommends it, get more exercise. Walking is a good choice. Bit by bit, increase the amount you walk every day. Try for at least 30 minutes on most days of the week.  ?? Schedule time each day for a bowel movement. A daily routine may help. Take your time and do not strain when having a bowel movement.  When should you call for help?  Call your doctor now or seek immediate medical care if:  ?? Your pain gets worse or is out of control.  ?? You feel down or blue, or you do not enjoy things like you once did. You  may be depressed, which is common in people with chronic pain. Depression can be treated.  ?? You have vomiting or cramps for more than 2 hours.  Watch closely for changes in your health, and be sure to contact your doctor if:  ?? You cannot sleep because of pain.  ?? You are very worried or anxious about your pain.  ?? You have trouble taking your pain medicine.  ?? You have any concerns about your pain medicine.  ?? You have trouble with bowel movements, such as:  ?? No bowel movement in 3 days.  ?? Blood in the anal area, in your stool, or on the  toilet paper.  ?? Diarrhea for more than 24 hours.  Where can you learn more?  Go to [facility]  Enter N004 in the search box to learn more about "Chronic Pain: Care Instructions."  ?? 2006-2016 [employer]. Care instructions adapted under license by [employer] (which disclaims liability or warranty for this information). This care instruction is for use with your licensed healthcare professional. If you have questions about a medical condition or this instruction, always ask your healthcare professional. [place] any warranty or liability for your use of this information.  Content Version: 10.9.538570; Current as of: January 23, 2015

## 2015-08-17 NOTE — Progress Notes (Signed)
[facility]  [address]  [place], [place] [address]  Phone: [phone]  Fax: [phone]        Cynthia Marsh, Cynthia Marsh  DOB: Feb 27, 1951  PCP: Marvia Pickles, MD  08/18/2015    NEW PATIENT      ASSESSMENT AND PLAN     Cynthia Marsh was seen today for back pain.    Diagnoses and all orders for this visit:    Chronic bilateral low back pain without sciatica  -     [72110] LS Spine 4V  -     cyclobenzaprine (FLEXERIL) 10 mg tablet; Take 0.5-1 Tabs by mouth three (3) times daily as needed for Muscle Spasm(s).  -     MRI LUMB SPINE W WO CONT; Future  -     Generic Supply Order  -     REFERRAL TO HOME HEALTH  -     KETOROLAC TROMETHAMINE INJ  -     THER/PROPH/DIAG INJECTION, SUBCUT/IM    Hx of spinal fusion  -     cyclobenzaprine (FLEXERIL) 10 mg tablet; Take 0.5-1 Tabs by mouth three (3) times daily as needed for Muscle Spasm(s).  -     MRI LUMB SPINE W WO CONT; Future  -     Generic Supply Order  -     REFERRAL TO HOME HEALTH  -     KETOROLAC TROMETHAMINE INJ  -     THER/PROPH/DIAG INJECTION, SUBCUT/IM    Weakness of both legs  -     MRI LUMB SPINE W WO CONT; Future  -     Generic Supply Order  -     REFERRAL TO HOME HEALTH    Other orders  -     ketorolac (TORADOL) 15 mg/mL soln injection; 4 mL by IntraMUSCular route once for 1 dose.    1. Lumbar spine MRI w/ contrast.   2. Continue Percocet 10-325 mg QID from PCP.   3. Trial of Flexeril 10 mg QHS PRN.   4. Rx for raised toilet seat with bars.   5. Rx for Life Alert due to high fall risk.   6. Given note that states that she is unable to stand and prepare meals.  7. Referral to home health.     Follow-up Disposition:  Return for MRI/CT f/u.      CHIEF COMPLAINT  Cynthia Marsh is seen today in consultation at the request of Dr. Shawnie Pons for complaints of low back and tailbone pain since June 2010. Hx of 2 lumbar fusions done elsewhere.        HISTORY OF PRESENT ILLNESS   Cynthia Marsh is a 64 y.o. female.  Pt reports that her pain became exacerbated when she had to stand and sit at the bus stop. Last month she had to catch the bus from [place], [place] to [place], [place] and had to sit at the bus stop for 9 hours. She states that by the time she got to [facility], she was unable to get up off of the bus due to exacerbated back pain. Since last month, her back pain has been exacerbated. She states that her back pain mainly stays in her back. She denies any sciatic pain. Pt notes that she is unable to straighten up at home. She will have to hold onto the walls when she ambulates around her home. Pt notes that her back pain has been limiting her activity as she is always in intense pain. Pt  is emotionally stressed during the office visit due to her pain level. She denies any paresthesia or weakness in her BLE. 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 She moved here last summer. She states that she now receives her pain medication from her PCP. She was put on a stronger pain medication without benefit. Dr. Shawnie Pons manages her Percocet.  Her brother lives in the area. Denies persistent fevers, chills, weight changes, neurogenic bowel or bladder symptoms. Pt denies recent ED visits or hospitalizations. Pt denies any recent GI ulcers or bleeds.      Pt wishes to have a Rx for a raised toilet seat as the place she lives now is not handicap accessible. Pt states that she has suffered from many falls as wishes to have a Rx for life alert. She states that she has called her insurance and they cover life alert.      Pain Assessment  02/18/2015   Location of Pain Knee   Location Modifiers Right   Severity of Pain 7   Quality of Pain Sharp;Aching   Duration of Pain Persistent   Frequency of Pain Constant   Aggravating Factors -   Limiting Behavior -   Relieving Factors -   Result of Injury -         PAST MEDICAL HISTORY   Past Medical History   Diagnosis Date   ??? Anxiety    ??? Asthma     ??? Chronic kidney disease    ??? Depression    ??? Diabetes (HCC)    ??? Hypertension    ??? Insomnia    ??? Osteoporosis        Past Surgical History   Procedure Laterality Date   ??? Pr anesth,surgery of shoulder     ??? Pr hand/finger surgery unlisted     ??? Pr laminectomy,cervical     ??? Hx cyst removal       calf    ??? Hx gyn       complete hyst    ??? Hx gyn       tubal ligation   ??? Hx wrist fracture tx     ??? Hx back surgery  2012     L4/L5/S1 fused in [place]   ??? Hx orthopaedic       Right shoulder rotator cuff    ??? Hx orthopaedic       Left Little toe hammer toe, left big toe bunion removed   ??? Hx orthopaedic       Right knee torn meniscus   ??? Hx orthopaedic       CTS bilateral , Left thumb trigger finger    ??? Pr breast surgery procedure unlisted        Left cyst removed        MEDICATIONS      Current Outpatient Prescriptions   Medication Sig Dispense Refill   ??? cyclobenzaprine (FLEXERIL) 10 mg tablet Take 0.5-1 Tabs by mouth three (3) times daily as needed for Muscle Spasm(s). 90 Tab 0   ??? ALPRAZolam (XANAX) 0.5 mg tablet TAKE 1 TABLET BY MOUTH TWICE DAILY. MAX DAILY AMOUNT:  60 Tab 2   ??? pravastatin (PRAVACHOL) 40 mg tablet Take 1 Tab by mouth nightly. 90 Tab 3   ??? oxybutynin chloride XL (DITROPAN XL) 10 mg CR tablet Take 1 Tab by mouth daily. 90 Tab 4   ??? oxyCODONE-acetaminophen (PERCOCET 10) 10-325 mg per tablet Take 1 Tab by mouth every six (  6) hours as needed for Pain. Max Daily Amount: 4 Tabs. 90 Tab 0   ??? Lancets misc by Does Not Apply route.     ??? topiramate (TOPAMAX) 25 mg tablet 1 tab daily for 1 week then BID 60 Tab 5   ??? mometasone (NASONEX) 50 mcg/actuation nasal spray 2 Sprays by Both Nostrils route daily. 1 Container 5   ??? FREESTYLE LITE STRIPS strip USE TO CHECK BLOOD SUGAR TWICE DAILY 150 Strip 5   ??? FREESTYLE LANCETS 28 gauge misc CHECK BLOOD SUGAR TWICE DAILY 300 Each 5   ??? metFORMIN (GLUCOPHAGE) 1,000 mg tablet TAKE 1 TABLET BY MOUTH TWICE DAILY WITH MEALS 180 Tab 3    ??? meclizine (ANTIVERT) 25 mg tablet Take 1 Tab by mouth three (3) times daily as needed. Indications: VERTIGO 10 Tab 0   ??? albuterol (PROVENTIL HFA, VENTOLIN HFA, PROAIR HFA) 90 mcg/actuation inhaler Take  by inhalation.     ??? mirtazapine (REMERON) 30 mg tablet Take  by mouth nightly.     ??? calcium citrate-vitamin d3 (CITRACAL+D) 315-200 mg-unit tab Take 1 Tab by mouth daily (with breakfast).     ??? busPIRone (BUSPAR) 15 mg tablet   4   ??? QVAR 80 mcg/actuation inhaler   11   ??? zolpidem (AMBIEN) 10 mg tablet   3   ??? amLODIPine (NORVASC) 10 mg tablet   4   ??? azelastine (ASTELIN) 137 mcg (0.1 %) nasal spray   4   ??? FLUoxetine (PROZAC) 40 mg capsule   4   ??? hydrochlorothiazide (HYDRODIURIL) 25 mg tablet   4   ??? ramipril (ALTACE) 5 mg capsule   4   ??? montelukast (SINGULAIR) 10 mg tablet   3       ALLERGIES    Allergies   Allergen Reactions   ??? Cozaar [Losartan] Cough   ??? Crestor [Rosuvastatin] Unknown (comments)     Heart beats fast          SOCIAL HISTORY    Social History     Social History   ??? Marital status: SINGLE     Spouse name: N/A   ??? Number of children: N/A   ??? Years of education: N/A     Social History Main Topics   ??? Smoking status: Former Smoker   ??? Smokeless tobacco: Never Used   ??? Alcohol use Yes      Comment: on occasion   ??? Drug use: None   ??? Sexual activity: Not Asked     Other Topics Concern   ??? None     Social History Narrative     Social History Narrative      Problem Relation Age of Onset   ??? Diabetes Mother    ??? Diabetes Brother          REVIEW OF SYSTEMS  Review of Systems   Constitutional: Negative for chills, fever and weight loss.   Respiratory: Negative for shortness of breath.    Cardiovascular: Negative for chest pain.   Gastrointestinal: Positive for constipation.        Negative for fecal incontinence   Genitourinary: Positive for frequency. Negative for dysuria.        Negative for urinary incontinence   Musculoskeletal: Negative for falls.        Per HPI   Skin: Negative for rash.    Neurological: Negative for dizziness, tingling, tremors, focal weakness and headaches.   Endo/Heme/Allergies: Does not bruise/bleed easily.   Psychiatric/Behavioral: The  patient does not have insomnia.          PHYSICAL EXAMINATION  Visit Vitals   ??? BP 107/78   ??? Pulse 80   ??? Temp 96.9 ??F (36.1 ??C) (Oral)   ??? Resp 18   ??? Ht 5\' 4"  (1.626 m)   ??? Wt 173 lb 6.4 oz (78.7 kg)   ??? SpO2 98%   ??? BMI 29.76 kg/m2         Accompanied by self    Constitutional:  Well developed, well nourished, in no acute distress.   Psychiatric: Affect and mood are appropriate.   Integumentary: No rashes or abrasions noted on exposed areas.    Cardiovascular/Peripheral Vascular: Intact l pulses. No peripheral edema is noted.  Lymphatic:  No evidence of lymphedema. No cervical lymphadenopathy.     SPINE/MUSCULOSKELETAL EXAM      Lumbar spine:  No rash, ecchymosis, or gross obliquity. Increased muscle tone of lumbar paraspinals. No fasciculations. No focal atrophy is noted. Tenderness to palpation diffusely of lumbar paraspinals. Tenderness to palpation at the bilateral sciatic notch. SI joints non-tender. Trochanters non tender.     Sensation grossly intact to light touch.      MOTOR:       Hip Flex  Quads Hamstrings Ankle DF EHL Ankle PF   Right +4/5 +4/5 +4/5 +4/5 +4/5 +4/5   Left +4/5 +4/5 +4/5 +4/5 +4/5 +4/5       Straight Leg raise negative. Squat not tested.     Ambulation with single point cane. FWB.      RADIOGRAPHS  Lumbar spine xray films reviewed:  1) Hardware intact at L5/S1 and L4-5  2) Early disc space narrowing at L3-4  3) No instability   4) Cages at L4-5 and L5-S1      Written by Arther Dames, Simone Curia, as dictated by Wynelle Beckmann, MD.    Cynthia Marsh may have a reminder for a "due or due soon" health maintenance. I have asked that she contact her primary care provider for follow-up on this health maintenance.

## 2015-08-18 NOTE — Telephone Encounter (Signed)
[facility]  Ms Cynthia Marsh called to discuss dme order pls call her at [phone] ext [phone] ask for  annie

## 2015-08-20 NOTE — Telephone Encounter (Signed)
Called pt to inform that she could take the orders to a medical supply store. No answer, left voice message stating the above.

## 2015-08-20 NOTE — Telephone Encounter (Signed)
Pt is calling because she has not heard anything about getting her DME, she states that [place] [facility] is going to take longer than expected and she wants to know if she can just take the order to a medical supply store. Please call patient to discuss options.

## 2015-08-20 NOTE — Telephone Encounter (Signed)
pls see additional/attached call documentation:  Pt called for status update of rolling walker and toilet seat.

## 2015-08-21 ENCOUNTER — Encounter: Attending: Orthopaedic Surgery | Primary: Family Medicine

## 2015-08-27 ENCOUNTER — Ambulatory Visit: Primary: Family Medicine

## 2015-08-29 ENCOUNTER — Inpatient Hospital Stay
Admit: 2015-08-29 | Payer: PRIVATE HEALTH INSURANCE | Attending: Physical Medicine & Rehabilitation | Primary: Family Medicine

## 2015-08-29 DIAGNOSIS — M5126 Other intervertebral disc displacement, lumbar region: Secondary | ICD-10-CM

## 2015-08-29 MED ORDER — GADOBUTROL 7.5 MMOL/7.5 ML (1 MMOL/ML) IV SYRINGE
7.5 mmol/ mL (1 mmol/mL) | Freq: Once | INTRAVENOUS | Status: AC
Start: 2015-08-29 — End: 2015-08-29
  Administered 2015-08-29: 15:00:00 via INTRAVENOUS

## 2015-08-29 MED FILL — GADAVIST 7.5 MMOL/7.5 ML (1 MMOL/ML) INTRAVENOUS SYRINGE: 7.5 mmol/ mL (1 mmol/mL) | INTRAVENOUS | Qty: 15

## 2015-09-02 ENCOUNTER — Encounter

## 2015-09-02 MED ORDER — OXYCODONE-ACETAMINOPHEN 10 MG-325 MG TAB
10-325 mg | ORAL_TABLET | Freq: Four times a day (QID) | ORAL | 0 refills | Status: DC | PRN
Start: 2015-09-02 — End: 2015-10-06

## 2015-09-02 MED ORDER — AZELASTINE 137 MCG NASAL SPRAY AEROSOL
137 mcg (0.1 %) | Freq: Two times a day (BID) | NASAL | 4 refills | Status: DC
Start: 2015-09-02 — End: 2016-01-11

## 2015-09-02 NOTE — Telephone Encounter (Signed)
Requested Prescriptions     Pending Prescriptions Disp Refills   ??? oxyCODONE-acetaminophen (PERCOCET 10) 10-325 mg per tablet 90 Tab 0     Sig: Take 1 Tab by mouth every six (6) hours as needed for Pain. Max Daily Amount: 4 Tabs.   ??? ALPRAZolam (XANAX) 0.5 mg tablet 60 Tab 2     Sig: TAKE 1 TABLET BY MOUTH TWICE DAILY. MAX DAILY AMOUNT:    ??? azelastine (ASTELIN) 137 mcg (0.1 %) nasal spray 1 Bottle 4       Last office visit was  08/06/15  Next office visit is     11/05/15  Last prescribed 08/06/15  Please assist.

## 2015-09-02 NOTE — Telephone Encounter (Signed)
I called and spoke with the patient and informed the patient that her refill request was approved and script was sent to the pharmacy. Patient verbalized understanding. I informed her that the provider did not refill the xanax because she should have two refills on the last script that was written for her. Patient double checked her medication bottle and verified that she indeed have two refills left on it and will contact the pharmacy.

## 2015-09-02 NOTE — Telephone Encounter (Signed)
She does not have refills on the Xanax

## 2015-09-02 NOTE — Telephone Encounter (Addendum)
Requested Prescriptions     Pending Prescriptions Disp Refills   ??? oxyCODONE-acetaminophen (PERCOCET 10) 10-325 mg per tablet 90 Tab 0     Sig: Take 1 Tab by mouth every six (6) hours as needed for Pain. Max Daily Amount: 4 Tabs.   ??? ALPRAZolam (XANAX) 0.5 mg tablet 60 Tab 2     Sig: TAKE 1 TABLET BY MOUTH TWICE DAILY. MAX DAILY AMOUNT:       Requested Prescriptions     Pending Prescriptions Disp Refills   ??? oxyCODONE-acetaminophen (PERCOCET 10) 10-325 mg per tablet 90 Tab 0     Sig: Take 1 Tab by mouth every six (6) hours as needed for Pain. Max Daily Amount: 4 Tabs.   ??? ALPRAZolam (XANAX) 0.5 mg tablet 60 Tab 2     Sig: TAKE 1 TABLET BY MOUTH TWICE DAILY. MAX DAILY AMOUNT:    ??? azelastine (ASTELIN) 137 mcg (0.1 %) nasal spray 1 Bottle 4

## 2015-09-07 ENCOUNTER — Ambulatory Visit: Attending: Physical Medicine & Rehabilitation | Primary: Family Medicine

## 2015-09-07 ENCOUNTER — Ambulatory Visit: Admit: 2015-09-07 | Payer: MEDICARE | Attending: Physical Medicine & Rehabilitation | Primary: Family Medicine

## 2015-09-07 DIAGNOSIS — M545 Low back pain, unspecified: Secondary | ICD-10-CM

## 2015-09-07 MED ORDER — CYCLOBENZAPRINE 10 MG TAB
10 mg | ORAL_TABLET | Freq: Three times a day (TID) | ORAL | 0 refills | Status: DC | PRN
Start: 2015-09-07 — End: 2015-11-25

## 2015-09-07 MED ORDER — KETOROLAC TROMETHAMINE 15 MG/ML INJECTION
15 mg/mL | Freq: Once | INTRAMUSCULAR | 0 refills | Status: AC
Start: 2015-09-07 — End: 2015-09-07

## 2015-09-07 NOTE — Progress Notes (Signed)
[facility]  [address], [address]  [place], [place] [address]  Phone: [phone]  Fax: [phone]        Cynthia Marsh, Cynthia Marsh  DOB: Oct 29, 1951  PCP: Marvia Pickles, MD  09/07/2015    PROGRESS NOTE      ASSESSMENT AND PLAN    Cynthia Marsh was seen today for back pain.    Diagnoses and all orders for this visit:    Chronic bilateral low back pain without sciatica  -     cyclobenzaprine (FLEXERIL) 10 mg tablet; Take 0.5-1 Tabs by mouth three (3) times daily as needed for Muscle Spasm(s).  -     KETOROLAC TROMETHAMINE INJ  -     THER/PROPH/DIAG INJECTION, SUBCUT/IM    Hx of spinal fusion  -     KETOROLAC TROMETHAMINE INJ  -     THER/PROPH/DIAG INJECTION, SUBCUT/IM    Other orders  -     ketorolac (TORADOL) 15 mg/mL soln injection; 4 mL by IntraMUSCular route once for 1 dose.    1. Advised to take half tabs of Flexeril to reduce her drowsiness.   2. Given information on back pain.     Follow-up Disposition:  Return if symptoms worsen or fail to improve.      HISTORY OF PRESENT ILLNESS  Cynthia Marsh is a 64 y.o. female. Pt presents to the office for a f/u visit for lumbar MRI review. Images reviewed with patient, no acute findings.    Pt continues to have low back pain. She states that her back pain became exacerbated after a long bus ride from [place], [place] to [place], [place]. She states that she was unable to get up off of the bus due to exacerbated back pain.Her back pain radiates into her BLE. Her pain has slowly started to ease up but states that it is taking a prolonged amount of time to do so. She denies any saddle paresthesia. Pt takes Flexeril 10 mg TID PRN that she benefits from. She states that she has some drowsiness with Flexeril. Denies persistent fevers, chills, weight changes, neurogenic bowel or bladder symptoms. Pt denies recent ED visits or hospitalizations. Pt denies any recent GI ulcers, bleeds or renal dysfucntion.     Home health has not come by yet.      Pain Assessment  09/07/2015   Location of Pain Back   Location Modifiers Lateral   Severity of Pain 9   Quality of Pain Aching   Duration of Pain Persistent   Frequency of Pain Constant   Aggravating Factors Bending;Stretching;Straightening;Exercise;Kneeling;Squatting;Standing;Walking;Stairs   Limiting Behavior Yes   Relieving Factors Ice;Heat;Rest   Result of Injury No               MRI Results (most recent):    Results from Hospital Encounter encounter on 08/29/15   MRI LUMB SPINE W WO CONT   Narrative PROCEDURE:  MR lumbar spine without and with contrast.    CPT code 13086.    INDICATION:  Increased low back pain with weakness in both legs.  Most recent  back surgery in March '16.    COMPARISON:  None currently available..    TECHNIQUE:      - Pre-contrast:  Sagittal T1, FSE T2, FSEIR images are obtained without IV  gadolinium, supplemented by axial T1 and T2 images through selected levels of  the lumbar spine.    - Post-contrast:  Axial T1 and sagittal fat sat T1 images.  - Contrast:  7.5 mL Gadavist.    FINDINGS:     Surgical hardware components consisting of pedicle screws and interpedicular  rods are observed at L4-L5-S1 segment with L5 laminectomy.  Disc spacers are in  place at L4-5 and L5-S1 levels.  Of note, the L4-5 disc spacer is located along  the anterior aspect of the disc space whereas the L5-S1 disc spacers consisting  of 2 bars along both paramedian aspects appear to extend from the frontal to  posterior aspect of the disc space.  Without correlating plain films or CTs,  whether there is encroachment of the spinal canal or neural foramina by the disc  spacers cannot be determined confidently.      No acute fracture is detected.  There is minimal grade 1 spondylolisthesis at  L4-5 and L5-S1 levels.  No abnormal intrathecal contents are noted.  The conus  medullaris is identified at L1-2 level.    L1-2:  Unremarkable.    L2-3:  Mild disc bulge along the left posterolateral aspect.  Mild to moderate   bilateral facet hypertrophy.  No significant central canal stenosis or foraminal  stenosis.    L3-4:  No significant disc herniation or protrusion.  No significant disc bulge.  Pronounced bilateral facet hypertrophy.  Subtle T2 high signal fluid in the  facets.    L4-5:  Fused level.  No definite disc protrusion or herniation or significant  osteophyte formation.  No central canal or foraminal stenosis.    L5-S1:  Fused level.  No convincing evidence for significant disc protrusion or  herniation or significant osteophyte formation.  The increased T1 and T2 signal  at the vestigial disc space is presumably related to magnetic susceptibility  artifact associated with the adjacent hardware components.  As noted above,  whether there is encroachment upon the central canal or neural foramina by the  disc spacers is difficult to determine on current study although overall  appearances rather doubtful for significant encroachment on the right side.   Presence of encroachment on the left side cannot be entirely excluded.    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  Incidentally, there is mild  broad-based bulging disc-osteophyte at T11-12.         Impression IMPRESSION:    1.  Postsurgical changes as described above.  The L5-S1 level demonstrates disc  spaces extending anterior to posterior aspects of the L5-S1 disc space.  Because  of the magnetic susceptibility artifact associated with the disc space, it is  difficult to determine whether there is distinct encroachment by the disc space  or into the spinal canal or neural foramina.  Correlation with either plain  films or more preferably CT is recommended.    2.  Facet arthropathy at L3-4 bilaterally.  Less pronounced facet arthropathy at  the L2-3 as well.           PAST MEDICAL HISTORY   Past Medical History   Diagnosis Date   ??? Anxiety    ??? Asthma    ??? Chronic kidney disease    ??? Depression    ??? Diabetes (HCC)    ??? Hypertension    ??? Insomnia     ??? Osteoporosis        Past Surgical History   Procedure Laterality Date   ??? Pr anesth,surgery of shoulder     ??? Pr hand/finger surgery unlisted     ??? Pr laminectomy,cervical     ??? Hx cyst removal  calf    ??? Hx gyn       complete hyst    ??? Hx gyn       tubal ligation   ??? Hx wrist fracture tx     ??? Hx back surgery  2012     L4/L5/S1 fused in [place]   ??? Hx orthopaedic       Right shoulder rotator cuff    ??? Hx orthopaedic       Left Little toe hammer toe, left big toe bunion removed   ??? Hx orthopaedic       Right knee torn meniscus   ??? Hx orthopaedic       CTS bilateral , Left thumb trigger finger    ??? Pr breast surgery procedure unlisted        Left cyst removed    .      MEDICATIONS    Current Outpatient Prescriptions   Medication Sig Dispense Refill   ??? oxyCODONE-acetaminophen (PERCOCET 10) 10-325 mg per tablet Take 1 Tab by mouth every six (6) hours as needed for Pain. Max Daily Amount: 4 Tabs. 90 Tab 0   ??? azelastine (ASTELIN) 137 mcg (0.1 %) nasal spray 2 Sprays by Both Nostrils route two (2) times a day. 1 Bottle 4   ??? cyclobenzaprine (FLEXERIL) 10 mg tablet Take 0.5-1 Tabs by mouth three (3) times daily as needed for Muscle Spasm(s). 90 Tab 0   ??? ALPRAZolam (XANAX) 0.5 mg tablet TAKE 1 TABLET BY MOUTH TWICE DAILY. MAX DAILY AMOUNT:  60 Tab 2   ??? pravastatin (PRAVACHOL) 40 mg tablet Take 1 Tab by mouth nightly. 90 Tab 3   ??? oxybutynin chloride XL (DITROPAN XL) 10 mg CR tablet Take 1 Tab by mouth daily. 90 Tab 4   ??? Lancets misc by Does Not Apply route.     ??? topiramate (TOPAMAX) 25 mg tablet 1 tab daily for 1 week then BID 60 Tab 5   ??? mometasone (NASONEX) 50 mcg/actuation nasal spray 2 Sprays by Both Nostrils route daily. 1 Container 5   ??? FREESTYLE LITE STRIPS strip USE TO CHECK BLOOD SUGAR TWICE DAILY 150 Strip 5   ??? FREESTYLE LANCETS 28 gauge misc CHECK BLOOD SUGAR TWICE DAILY 300 Each 5   ??? metFORMIN (GLUCOPHAGE) 1,000 mg tablet TAKE 1 TABLET BY MOUTH TWICE DAILY WITH MEALS 180 Tab 3    ??? meclizine (ANTIVERT) 25 mg tablet Take 1 Tab by mouth three (3) times daily as needed. Indications: VERTIGO 10 Tab 0   ??? albuterol (PROVENTIL HFA, VENTOLIN HFA, PROAIR HFA) 90 mcg/actuation inhaler Take  by inhalation.     ??? mirtazapine (REMERON) 30 mg tablet Take  by mouth nightly.     ??? calcium citrate-vitamin d3 (CITRACAL+D) 315-200 mg-unit tab Take 1 Tab by mouth daily (with breakfast).     ??? busPIRone (BUSPAR) 15 mg tablet   4   ??? QVAR 80 mcg/actuation inhaler   11   ??? zolpidem (AMBIEN) 10 mg tablet   3   ??? amLODIPine (NORVASC) 10 mg tablet   4   ??? FLUoxetine (PROZAC) 40 mg capsule   4   ??? hydrochlorothiazide (HYDRODIURIL) 25 mg tablet   4   ??? ramipril (ALTACE) 5 mg capsule   4   ??? montelukast (SINGULAIR) 10 mg tablet   3        ALLERGIES  Allergies   Allergen Reactions   ??? Cozaar [Losartan] Cough   ??? Crestor [Rosuvastatin]  Unknown (comments)     Heart beats fast          SOCIAL HISTORY    Social History     Social History   ??? Marital status: SINGLE     Spouse name: N/A   ??? Number of children: N/A   ??? Years of education: N/A     Occupational History   ??? Not on file.     Social History Main Topics   ??? Smoking status: Former Smoker   ??? Smokeless tobacco: Never Used   ??? Alcohol use Yes      Comment: on occasion   ??? Drug use: Not on file   ??? Sexual activity: Not on file     Other Topics Concern   ??? Not on file     Social History Narrative       FAMILY HISTORY  Family History   Problem Relation Age of Onset   ??? Diabetes Mother    ??? Diabetes Brother        REVIEW OF SYSTEMS  Review of Systems   Constitutional: Negative for chills, fever and weight loss.   Respiratory: Negative for shortness of breath.    Cardiovascular: Negative for chest pain.   Gastrointestinal: Negative for constipation.        Negative for fecal incontinence   Genitourinary: Negative for dysuria.        Negative for urinary incontinence   Musculoskeletal: Negative for falls.        Per HPI   Skin: Negative for rash.    Neurological: Negative for dizziness, tingling, tremors, focal weakness and headaches.   Endo/Heme/Allergies: Does not bruise/bleed easily.   Psychiatric/Behavioral: The patient does not have insomnia.        PHYSICAL EXAMINATION  Visit Vitals   ??? BP 106/78   ??? Pulse 84   ??? Temp 98.2 ??F (36.8 ??C) (Oral)   ??? Resp 18   ??? Ht 5\' 4"  (1.626 m)   ??? Wt 169 lb 3.2 oz (76.7 kg)   ??? SpO2 98%   ??? BMI 29.04 kg/m2         Accompanied by self      Constitutional:  Well developed, well nourished, in no acute distress.   Psychiatric: Affect and mood are appropriate.   Integumentary: No rashes or abrasions noted on exposed areas.    Cardiovascular/Peripheral Vascular: Intact l pulses. No peripheral edema is noted.  Lymphatic:  No evidence of lymphedema. No cervical lymphadenopathy.     SPINE/MUSCULOSKELETAL EXAM        Lumbar spine:  No rash, ecchymosis, or gross obliquity. No fasciculations. No focal atrophy is noted. No pain with hip ROM. Tenderness to palpation L4-5 and L5-S1. No tenderness to palpation at the sciatic notch. SI joints non-tender. Trochanters non tender.       Sensation grossly intact to light touch.      MOTOR:       Hip Flex  Quads Hamstrings Ankle DF EHL Ankle PF   Right 4/5 4/5 4/5 4/5 4/5 4/5   Left 4/5 4/5 4/5 4/5 4/5 4/5       Squat not tested.     Ambulation with single point cane. FWB.      Written by Arther Dames, [employer], as dictated by Wynelle Beckmann, MD.    Ms. Orren may have a reminder for a "due or due soon" health maintenance. I have asked that she contact her primary care provider for follow-up on this  health maintenance.

## 2015-09-07 NOTE — Progress Notes (Signed)
After obtaining consent, and per orders of Dr. ARORA, injection of TORADOL 60 MG given by Tori M Ricks, LPN. Patient instructed to report any adverse reaction to me immediately. PT TOLERATED WELL AND WITHOUT INCIDENT.

## 2015-09-07 NOTE — Progress Notes (Signed)
VORB entered per Dr. Wilford Corner as documented on blue sheet: Toradol 60 mg IM

## 2015-09-07 NOTE — Patient Instructions (Addendum)
Learning About Relief for Back Pain  What is back tension and strain?     Back strain happens when you overstretch, or pull, a muscle in your back. You may hurt your back in an accident or when you exercise or lift something.  Most back pain will get better with rest and time. You can take care of yourself at home to help your back heal.  What can you do first to relieve back pain?  When you first feel back pain, try these steps:  ?? Walk. Take a short walk (10 to 20 minutes) on a level surface (no slopes, hills, or stairs) every 2 to 3 hours. Walk only distances you can manage without pain, especially leg pain.  ?? Relax. Find a comfortable position for rest. Some people are comfortable on the floor or a medium-firm bed with a small pillow under their head and another under their knees. Some people prefer to lie on their side with a pillow between their knees. Don't stay in one position for too long.  ?? Try heat or ice. Try using a heating pad on a low or medium setting, or take a warm shower, for 15 to 20 minutes every 2 to 3 hours. Or you can buy single-use heat wraps that last up to 8 hours. You can also try an ice pack for 10 to 15 minutes every 2 to 3 hours. You can use an ice pack or a bag of frozen vegetables wrapped in a thin towel. There is not strong evidence that either heat or ice will help, but you can try them to see if they help. You may also want to try switching between heat and cold.  ?? Take pain medicine exactly as directed.  ?? If the doctor gave you a prescription medicine for pain, take it as prescribed.  ?? If you are not taking a prescription pain medicine, ask your doctor if you can take an over-the-counter medicine.  What else can you do?  ?? Stretch and exercise. Exercises that increase flexibility may relieve your pain and make it easier for your muscles to keep your spine in a good, neutral position. And don't forget to keep walking.   ?? Do self-massage. You can use self-massage to unwind after work or school or to energize yourself in the morning. You can easily massage your feet, hands, or neck. Self-massage works best if you are in comfortable clothes and are sitting or lying in a comfortable position. Use oil or lotion to massage bare skin.  ?? Reduce stress. Back pain can lead to a vicious circle: Distress about the pain tenses the muscles in your back, which in turn causes more pain. Learn how to relax your mind and your muscles to lower your stress.  Where can you learn more?  Go to http://www.healthwise.net/[employer]  Enter Q517 in the search box to learn more about "Learning About Relief for Back Pain."  ?? 2006-2016 [employer]. Care instructions adapted under license by [employer] (which disclaims liability or warranty for this information). This care instruction is for use with your licensed healthcare professional. If you have questions about a medical condition or this instruction, always ask your healthcare professional. Healthwise, Incorporated disclaims any warranty or liability for your use of this information.  Content Version: 11.0.578772; Current as of: Apr 27, 2015

## 2015-09-08 NOTE — Addendum Note (Signed)
Addended by: Walker Kehr on: 09/08/2015 11:59 AM      Modules accepted: Level of Service

## 2015-09-11 ENCOUNTER — Telehealth

## 2015-09-11 NOTE — Telephone Encounter (Signed)
Tammy from [facility] home is calling requesting clarification of the order sent. Please advise [phone].

## 2015-09-11 NOTE — Telephone Encounter (Signed)
Called Tammy with [facility] and wanted to know if order could be changed to Eval for Home PT. She also stated that they do not do anything with [facility] and pt will need to go thru outside company.    [facility] ENTERED FOR HOME HEALTH EVAL FOR HOME PT.

## 2015-09-11 NOTE — Telephone Encounter (Signed)
Ok, noted.

## 2015-09-14 ENCOUNTER — Encounter: Admit: 2015-09-14 | Discharge: 2015-09-14 | Payer: PRIVATE HEALTH INSURANCE | Primary: Family Medicine

## 2015-09-16 ENCOUNTER — Encounter: Admit: 2015-09-16 | Discharge: 2015-09-16 | Payer: PRIVATE HEALTH INSURANCE | Primary: Family Medicine

## 2015-09-17 ENCOUNTER — Ambulatory Visit: Admit: 2015-09-17 | Discharge: 2015-09-17 | Payer: MEDICARE | Attending: Physician Assistant | Primary: Family Medicine

## 2015-09-17 ENCOUNTER — Encounter: Primary: Family Medicine

## 2015-09-17 ENCOUNTER — Encounter: Attending: Orthopaedic Surgery | Primary: Family Medicine

## 2015-09-17 DIAGNOSIS — M25561 Pain in right knee: Secondary | ICD-10-CM

## 2015-09-17 MED ORDER — BETAMETHASONE ACET & SOD PHOS 6 MG/ML SUSP FOR INJECTION
6 mg/mL | Freq: Once | INTRAMUSCULAR | 0 refills | Status: AC
Start: 2015-09-17 — End: 2015-09-17

## 2015-09-17 NOTE — Progress Notes (Signed)
[facility] [address]  [address], [address]  [place] [id number]  [phone]           Patient: Cynthia Marsh                MRN: [id number]       SSN: [id number]  Date of Birth: 03/26/1951        AGE: 64 y.o.        SEX: female  Body mass index is 30.31 kg/(m^2).    PCP: Marvia PicklesNoel L Hunte, MD  09/17/15      This office note has been dictated.      REVIEW OF SYSTEMS:  Constitutional: Negative for fever, chills, weight loss and malaise/fatigue.   HENT: Negative.    Eyes: Negative.    Respiratory: Negative.   Cardiovascular: Negative.   Gastrointestinal: No bowel incontinence or constipation.  Genitourinary: No bladder incontinence or saddle anesthesia.  Skin: Negative.   Neurological: Negative.    Endo/Heme/Allergies: Negative.    Psychiatric/Behavioral: Negative.  Musculoskeletal: As per HPI above.     Past Medical History   Diagnosis Date   ??? Anxiety    ??? Asthma    ??? Chronic kidney disease    ??? Depression    ??? Diabetes (HCC)    ??? Hypertension    ??? Insomnia    ??? Osteoporosis          Current Outpatient Prescriptions:   ???  betamethasone (CELESTONE SOLUSPAN) 6 mg/mL injection, 1 mL by IntraMUSCular route once for 1 dose., Disp: 1 mL, Rfl: 0  ???  fexofenadine (ALLEGRA) 180 mg tablet, Take 180 mg by mouth daily., Disp: , Rfl:   ???  EPINEPHrine (EPIPEN) 0.3 mg/0.3 mL injection, 0.3 mg by IntraMUSCular route once as needed for Allergic Response., Disp: , Rfl:   ???  cyclobenzaprine (FLEXERIL) 10 mg tablet, Take 0.5-1 Tabs by mouth three (3) times daily as needed for Muscle Spasm(s)., Disp: 90 Tab, Rfl: 0  ???  oxyCODONE-acetaminophen (PERCOCET 10) 10-325 mg per tablet, Take 1 Tab by mouth every six (6) hours as needed for Pain. Max Daily Amount: 4 Tabs., Disp: 90 Tab, Rfl: 0  ???  azelastine (ASTELIN) 137 mcg (0.1 %) nasal spray, 2 Sprays by Both Nostrils route two (2) times a day., Disp: 1 Bottle, Rfl: 4  ???  ALPRAZolam (XANAX) 0.5 mg tablet, TAKE 1 TABLET BY MOUTH TWICE DAILY.  MAX DAILY AMOUNT: 1MG , Disp: 60 Tab, Rfl: 2  ???  pravastatin (PRAVACHOL) 40 mg tablet, Take 1 Tab by mouth nightly., Disp: 90 Tab, Rfl: 3  ???  oxybutynin chloride XL (DITROPAN XL) 10 mg CR tablet, Take 1 Tab by mouth daily., Disp: 90 Tab, Rfl: 4  ???  Lancets misc, by Does Not Apply route., Disp: , Rfl:   ???  topiramate (TOPAMAX) 25 mg tablet, 1 tab daily for 1 week then BID, Disp: 60 Tab, Rfl: 5  ???  mometasone (NASONEX) 50 mcg/actuation nasal spray, 2 Sprays by Both Nostrils route daily., Disp: 1 Container, Rfl: 5  ???  FREESTYLE LITE STRIPS strip, USE TO CHECK BLOOD SUGAR TWICE DAILY, Disp: 150 Strip, Rfl: 5  ???  FREESTYLE LANCETS 28 gauge misc, CHECK BLOOD SUGAR TWICE DAILY, Disp: 300 Each, Rfl: 5  ???  metFORMIN (GLUCOPHAGE) 1,000 mg tablet, TAKE 1 TABLET BY MOUTH TWICE DAILY WITH MEALS, Disp: 180 Tab, Rfl: 3  ???  meclizine (ANTIVERT) 25 mg tablet, Take 1 Tab by mouth three (3)  times daily as needed. Indications: VERTIGO, Disp: 10 Tab, Rfl: 0  ???  albuterol (PROVENTIL HFA, VENTOLIN HFA, PROAIR HFA) 90 mcg/actuation inhaler, Take 2 Puffs by inhalation every six (6) hours as needed for Shortness of Breath., Disp: , Rfl:   ???  mirtazapine (REMERON) 30 mg tablet, Take 30 mg by mouth nightly., Disp: , Rfl:   ???  calcium citrate-vitamin d3 (CITRACAL+D) 315-200 mg-unit tab, Take 1 Tab by mouth daily (with breakfast)., Disp: , Rfl:   ???  busPIRone (BUSPAR) 15 mg tablet, Take 15 mg by mouth three (3) times daily., Disp: , Rfl: 4  ???  QVAR 80 mcg/actuation inhaler, Take 1 Puff by inhalation two (2) times a day., Disp: , Rfl: 11  ???  zolpidem (AMBIEN) 10 mg tablet, Take 10 mg by mouth nightly., Disp: , Rfl: 3  ???  amLODIPine (NORVASC) 10 mg tablet, Take 10 mg by mouth daily., Disp: , Rfl: 4  ???  FLUoxetine (PROZAC) 40 mg capsule, Take 40 mg by mouth daily., Disp: , Rfl: 4  ???  hydrochlorothiazide (HYDRODIURIL) 25 mg tablet, Take 25 mg by mouth daily., Disp: , Rfl: 4  ???  ramipril (ALTACE) 5 mg capsule, Take 5 mg by mouth daily., Disp: , Rfl:  4  ???  montelukast (SINGULAIR) 10 mg tablet, Take 10 mg by mouth daily., Disp: , Rfl: 3    Allergies   Allergen Reactions   ??? 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   ??? Cozaar [Losartan] Cough   ??? Crestor [Rosuvastatin] Unknown (comments)     Heart beats fast       Social History     Social History   ??? Marital status: SINGLE     Spouse name: N/A   ??? Number of children: N/A   ??? Years of education: N/A     Occupational History   ??? Not on file.     Social History Main Topics   ??? Smoking status: Former Smoker   ??? Smokeless tobacco: Never Used   ??? Alcohol use Yes      Comment: on occasion   ??? Drug use: Not on file   ??? Sexual activity: Not on file     Other Topics Concern   ??? Not on file     Social History Narrative       Past Surgical History   Procedure Laterality Date   ??? Pr anesth,surgery of shoulder     ??? Pr hand/finger surgery unlisted     ??? Pr laminectomy,cervical     ??? Hx cyst removal       calf    ??? Hx gyn       complete hyst    ??? Hx gyn       tubal ligation   ??? Hx wrist fracture tx     ??? Hx back surgery  2012     L4/L5/S1 fused in [place]   ??? Hx orthopaedic       Right shoulder rotator cuff    ??? Hx orthopaedic       Left Little toe hammer toe, left big toe bunion removed   ??? Hx orthopaedic       Right knee torn meniscus   ??? Hx orthopaedic       CTS bilateral , Left thumb trigger finger    ??? Pr breast surgery procedure unlisted        Left cyst removed            *  Patient was identified by name and date of birth   * Agreement on procedure being performed was verified  * Risks and Benefits explained to the patient  * Procedure site verified and marked as necessary  * Patient was positioned for comfort  * Consent was signed and verified  1:25 PM    The patient was instructed on post injection care.            We did see Ms. Bennis for follow-up in regards to her right knee.  The patient does have known advancing arthritis of the right knee.  She had an  injection by Dr. Paris Lore back in March, which gave her good relief. She has started having some discomfort in the last couple weeks. She denies any locking or giving way. There have been no injuries to the knee.  She does report discomfort getting up from a chair and going up and down stairs. She is unable to kneel on the right knee.  She denies any radiating pain, numbness, or tingling down the lower extremity.      PHYSICAL EXAMINATION: In general the patient is alert and oriented x 3 and is in no acute distress.  The patient is well-developed and well-nourished with a normal affect. The patient is afebrile.  HEENT:  Head is normocephalic and atraumatic.  Pupils are equally round and reactive to light and accommodation.  Extraocular eye movements are intact.  Neck is supple.  Trachea is midline. No JVD is present.  Breathing is nonlabored.  Examination of the lower extremities reveals pain free range of motion of the hips.  There is no pain with palpation to the trochanteric bursae.  There is negative straight leg raise. There is negative calf tenderness and swelling.  There is negative Homans.  There is no evidence of DVT noted. Right knee reveals the skin to be intact. There is no erythema or ecchymosis. There is no warmth or signs for infection or cellulitis.  There is minimal effusion with negative patellar ballottement.  There are no signs for infection or cellulitis.  She has discomfort with palpation to the lateral joint line, as well as patellofemoral grind and crepitus anteriorly with range of motion activities noted.     RADIOGRAPHS:  Review of radiographs including AP, tunnel, lateral, and skyline of the right knee confirms moderate osteoarthritis of the right knee with valgus deformity present.  No fracture or dislocation is seen.      ASSESSMENT: Right knee moderately advanced arthritis.    PLAN: At this point we will continue with conservative treatment. The last  injection worked quite well for her.  Today in the office we will move forward with a repeat Cortisone injection.  After informed and written consent the right knee was prepped with Betadine and 6 mg of Celestone was injected without complications.   The patient tolerated the injection well. She was instructed on post injection care. We will see her back in the office in approximately three months??? time for reevaluation and repeat injection if necessary.                     JR Katalina Magri MPAS, PA-C, ATC

## 2015-09-17 NOTE — Progress Notes (Signed)
Chief Complaint   Patient presents with   ??? Knee Pain     Right     10/10 pain.

## 2015-09-18 ENCOUNTER — Encounter: Primary: Family Medicine

## 2015-09-18 NOTE — Telephone Encounter (Signed)
-----   Message from Jeanie Sewereeta M Arora, MD sent at 09/16/2015 12:45 PM EDT -----  Regarding: FW: prescription  Action: Write letter      ----- Message -----     From: Othelia PullingSandra L Luczynski, PTA     Sent: 09/15/2015   9:07 PM       To: Jeanie Sewereeta M Arora, MD  Subject: prescription                                     Could we please have a script for a 3-1 commode and a 4 wheeled walker .

## 2015-09-18 NOTE — Telephone Encounter (Signed)
Called patient to inform her that Dr. Wilford CornerArora has ordered 3 in 1 commode and 4 wheeled walker.

## 2015-09-21 ENCOUNTER — Encounter: Primary: Family Medicine

## 2015-09-22 ENCOUNTER — Encounter: Admit: 2015-09-22 | Discharge: 2015-09-22 | Payer: PRIVATE HEALTH INSURANCE | Primary: Family Medicine

## 2015-09-23 ENCOUNTER — Encounter: Primary: Family Medicine

## 2015-09-24 ENCOUNTER — Encounter: Admit: 2015-09-24 | Discharge: 2015-09-24 | Payer: PRIVATE HEALTH INSURANCE | Primary: Family Medicine

## 2015-09-24 NOTE — Telephone Encounter (Signed)
Rhonda with [facility] did not get the order for the 3-1 commode which was ordered by Dr. Wilford CornerArora and which is what she needs for the patients ins to cover. Bjorn LoserRhonda did get order for raised toilet seat which is not covered by the ins. Bjorn LoserRhonda will fax over a form for Dr. Wilford CornerArora or NP to complete and have Dr. Wilford CornerArora to sign. Please call Bjorn LoserRhonda back at [phone]

## 2015-09-24 NOTE — Telephone Encounter (Signed)
Patient called ref the 3-1 commode. [facility] so they can give the chair to her.  Patient phone no is [phone]

## 2015-09-25 ENCOUNTER — Encounter: Admit: 2015-09-25 | Discharge: 2015-09-25 | Payer: PRIVATE HEALTH INSURANCE | Primary: Family Medicine

## 2015-09-25 NOTE — Telephone Encounter (Signed)
I called [facility] left message for Bjorn LoserRhonda to resend fax or call me, if I get new fax with 3-1 commode request Ill have Dr. Wilford CornerArora sign and fax back.

## 2015-09-25 NOTE — Telephone Encounter (Signed)
Have you seen a form from [facility] on the commode that Dr. Wilford CornerArora had ordered.

## 2015-09-28 ENCOUNTER — Encounter: Admit: 2015-09-28 | Discharge: 2015-09-28 | Payer: PRIVATE HEALTH INSURANCE | Primary: Family Medicine

## 2015-09-29 ENCOUNTER — Encounter: Primary: Family Medicine

## 2015-10-06 ENCOUNTER — Encounter

## 2015-10-06 DIAGNOSIS — F320 Major depressive disorder, single episode, mild: Secondary | ICD-10-CM | POA: Diagnosis not present

## 2015-10-06 MED ORDER — RAMIPRIL 5 MG CAP
5 mg | ORAL_CAPSULE | Freq: Every day | ORAL | 0 refills | Status: DC
Start: 2015-10-06 — End: 2016-01-05

## 2015-10-06 MED ORDER — MONTELUKAST 10 MG TAB
10 mg | ORAL_TABLET | Freq: Every day | ORAL | 0 refills | Status: DC
Start: 2015-10-06 — End: 2015-10-06

## 2015-10-06 MED ORDER — AMLODIPINE 10 MG TAB
10 mg | ORAL_TABLET | Freq: Every day | ORAL | 0 refills | Status: DC
Start: 2015-10-06 — End: 2015-10-06

## 2015-10-06 NOTE — Telephone Encounter (Signed)
Requested Prescriptions     Pending Prescriptions Disp Refills   ??? ramipril (ALTACE) 5 mg capsule  4     Sig: Take 1 Cap by mouth daily.   ??? amLODIPine (NORVASC) 10 mg tablet  4     Sig: Take 1 Tab by mouth daily.   ??? montelukast (SINGULAIR) 10 mg tablet  3     Sig: Take 1 Tab by mouth daily.       Last office visit was  08/06/15  Next office visit is     11/05/15    Please assist.

## 2015-10-06 NOTE — Telephone Encounter (Signed)
Requested Prescriptions     Pending Prescriptions Disp Refills   ??? oxyCODONE-acetaminophen (PERCOCET 10) 10-325 mg per tablet 90 Tab 0     Sig: Take 1 Tab by mouth every six (6) hours as needed for Pain. Max Daily Amount: 4 Tabs.   ??? ALPRAZolam (XANAX) 0.5 mg tablet 60 Tab 2     Sig: TAKE 1 TABLET BY MOUTH TWICE DAILY. MAX DAILY AMOUNT: 1MG         Pt out of these medications. Request please process asap. Has 2 pills left on xanax     Pts ph [phone] or hm [phone]

## 2015-10-06 NOTE — Telephone Encounter (Signed)
Amlodipine, Singulair, and Ramipril refilled and sent to pharmacy. Please notify pt.

## 2015-10-06 NOTE — Telephone Encounter (Signed)
Phone could not accept calls.

## 2015-10-06 NOTE — Telephone Encounter (Signed)
Requested Prescriptions     Pending Prescriptions Disp Refills   ??? oxyCODONE-acetaminophen (PERCOCET 10) 10-325 mg per tablet 90 Tab 0     Sig: Take 1 Tab by mouth every six (6) hours as needed for Pain. Max Daily Amount: 4 Tabs.   ??? ALPRAZolam (XANAX) 0.5 mg tablet 60 Tab 2     Sig: TAKE 1 TABLET BY MOUTH TWICE DAILY. MAX DAILY AMOUNT: 1MG        Last office visit was  08/06/15  Next office visit is     11/05/15    Please assist.

## 2015-10-06 NOTE — Telephone Encounter (Signed)
Requested Prescriptions     Pending Prescriptions Disp Refills   ??? ramipril (ALTACE) 5 mg capsule  4     Sig: Take 1 Cap by mouth daily.   ??? amLODIPine (NORVASC) 10 mg tablet  4     Sig: Take 1 Tab by mouth daily.   ??? montelukast (SINGULAIR) 10 mg tablet  3     Sig: Take 1 Tab by mouth daily.

## 2015-10-07 NOTE — Telephone Encounter (Signed)
Pt request please call her on this phone # when prescriptions are ready to be picked up.     [phone]

## 2015-10-08 MED ORDER — ALPRAZOLAM 0.5 MG TAB
0.5 mg | ORAL_TABLET | ORAL | 0 refills | Status: DC
Start: 2015-10-08 — End: 2015-12-10

## 2015-10-08 MED ORDER — OXYCODONE-ACETAMINOPHEN 10 MG-325 MG TAB
10-325 mg | ORAL_TABLET | Freq: Four times a day (QID) | ORAL | 0 refills | Status: DC | PRN
Start: 2015-10-08 — End: 2015-11-12

## 2015-10-08 NOTE — Telephone Encounter (Signed)
Pt aware written RX are ready for pickup.

## 2015-10-09 MED ORDER — AMLODIPINE 10 MG TAB
10 mg | ORAL_TABLET | ORAL | 0 refills | Status: DC
Start: 2015-10-09 — End: 2016-01-11

## 2015-10-09 MED ORDER — MONTELUKAST 10 MG TAB
10 mg | ORAL_TABLET | ORAL | 0 refills | Status: DC
Start: 2015-10-09 — End: 2015-11-16

## 2015-10-09 NOTE — Telephone Encounter (Signed)
Amlodipine and Singulair refilled and script printed. Please notify pt.

## 2015-10-09 NOTE — Telephone Encounter (Signed)
I called and spoke with the patient and informed the patient that her refill request was approved and sent to the pharmacy. Patient verbalized understanding.

## 2015-10-12 ENCOUNTER — Encounter: Attending: Family Medicine | Primary: Family Medicine

## 2015-10-14 ENCOUNTER — Encounter

## 2015-10-14 ENCOUNTER — Encounter: Attending: Family Medicine | Primary: Family Medicine

## 2015-10-14 ENCOUNTER — Ambulatory Visit: Admit: 2015-10-14 | Discharge: 2015-10-14 | Payer: MEDICARE | Attending: Family Medicine | Primary: Family Medicine

## 2015-10-14 DIAGNOSIS — M792 Neuralgia and neuritis, unspecified: Secondary | ICD-10-CM

## 2015-10-14 MED ORDER — GABAPENTIN 300 MG CAP
300 mg | ORAL_CAPSULE | Freq: Three times a day (TID) | ORAL | 0 refills | Status: DC
Start: 2015-10-14 — End: 2015-10-14

## 2015-10-14 MED ORDER — PREGABALIN 50 MG CAP
50 mg | ORAL_CAPSULE | Freq: Three times a day (TID) | ORAL | 0 refills | Status: DC
Start: 2015-10-14 — End: 2015-11-11

## 2015-10-14 MED ORDER — ALBUTEROL SULFATE HFA 90 MCG/ACTUATION AEROSOL INHALER
90 mcg/actuation | Freq: Four times a day (QID) | RESPIRATORY_TRACT | 5 refills | Status: DC | PRN
Start: 2015-10-14 — End: 2016-01-11

## 2015-10-14 MED ORDER — QVAR 80 MCG/ACTUATION METERED AEROSOL ORAL INHALER
80 mcg/actuation | Freq: Two times a day (BID) | RESPIRATORY_TRACT | 11 refills | Status: DC
Start: 2015-10-14 — End: 2015-11-15

## 2015-10-14 NOTE — Patient Instructions (Addendum)
Golfer's Elbow: Care Instructions  Your Care Instructions  The pain and soreness in the inner part of your elbow is caused by a problem called golfer's elbow. Bending the wrist over and over again has hurt the tendons that attach to your inner elbow. The muscles in your forearm also may hurt.  Golfer's elbow usually gets better with treatment at home. To heal the injured tendons, rest your elbow from wrist movements that make it hurt more. This can include movements like swinging a golf club or racquet or throwing a ball. To relieve pain, use ice and anti-inflammatory medicine. When your pain gets better, your doctor may suggest stretching and strengthening exercises.  Follow-up care is a key part of your treatment and safety. Be sure to make and go to all appointments, and call your doctor if you are having problems. It's also a good idea to know your test results and keep a list of the medicines you take.  How can you care for yourself at home?  ?? Rest your elbow and wrist. Try to avoid movements that are painful. You may have to do this for weeks to months. Follow your doctor's directions for how long to rest.  ?? Put ice or a cold pack on your elbow for 10 to 20 minutes at a time. Try to do this every 1 to 2 hours for the next 3 days (when you are awake) or until the swelling goes down. Put a thin cloth between the ice and your skin.  ?? Prop up the sore arm on a pillow when you ice it or anytime you sit or lie down during the next 3 days. Try to keep it above the level of your heart. This will help reduce swelling.  ?? Take pain medicine exactly as directed.  ?? If the doctor gave you a prescription medicine for pain, take it as prescribed.  ?? If you are not taking a prescription pain medicine, ask your doctor if you can take an over-the-counter medicine.  ?? Follow your doctor's or physical therapist's directions for exercise.  To prevent golfer's elbow   ?? After your elbow has healed, learn the best techniques for your work or sport. A physical or occupational therapist can help you.  When should you call for help?  Call your doctor now or seek immediate medical care if:  ?? Your pain gets worse.  ?? You cannot bend your elbow normally.  ?? You have tingling, weakness, or numbness in your hand and fingers.  ?? Your arm or hand is cool or pale or changes color.  Watch closely for changes in your health, and be sure to contact your doctor if:  ?? You have work problems caused by your elbow pain.  ?? Your pain is not better after 2 weeks.  Where can you learn more?  Go to StreetWrestling.at  Enter [phone] in the search box to learn more about "Golfer's Elbow: Care Instructions."  ?? 2006-2016 [employer]. Care instructions adapted under license by [employer] (which disclaims liability or warranty for this information). This care instruction is for use with your licensed healthcare professional. If you have questions about a medical condition or this instruction, always ask your healthcare professional. [place] any warranty or liability for your use of this information.  Content Version: 11.0.578772; Current as of: Apr 27, 2015        Golfer's Elbow: Exercises  Your Care Instructions  Here are some examples of typical rehabilitation  exercises for your condition. Start each exercise slowly. Ease off the exercise if you start to have pain.  Your doctor or physical therapist will tell you when you can start these exercises and which ones will work best for you.  How to do the exercises  Wrist extensor stretch    1. Extend your affected arm in front of you and make a fist with your palm facing down.  2. Bend your wrist so that your fist points toward the floor.  3. With your other hand, gently bend your wrist farther until you feel a mild to moderate stretch in your forearm.   4. Hold for at least 15 to 30 seconds.  5. Repeat 2 to 4 times.  6. Repeat steps 1 through 5 with your fingers pointing toward the floor.  Forearm extensor stretch    1. Place your affected elbow down at your side, bent at about 90 degrees. Then make a fist with your palm facing down.  2. Keeping your wrist bent, slowly straighten your elbow so your arm is down at your side. Then twist your fist out so your palm is facing out to the side and you feel a stretch.  3. Hold for at least 15 to 30 seconds.  4. Repeat 2 to 4 times.  Wrist flexor stretch    1. Extend your affected arm in front of you with your palm facing away from your body.  2. Bend back your wrist, pointing your hand up toward the ceiling.  3. With your other hand, gently bend your wrist farther until you feel a mild to moderate stretch in your forearm.  4. Hold for at least 15 to 30 seconds.  5. Repeat 2 to 4 times.  6. Repeat steps 1 through 5, but this time extend your affected arm in front of you with your palm facing up. Then bend back your wrist, pointing your hand toward the floor.  Wrist curls    1. Place your forearm on a table with your hand hanging over the edge of the table, palm up.  2. Place a 1- to 2-pound weight in your hand. This may be a dumbbell, a can of food, or a filled water bottle.  3. Slowly raise and lower the weight while keeping your forearm on the table and your palm facing up.  4. Repeat this motion 8 to 12 times.  5. Switch arms, and do steps 1 through 4.  6. Repeat with your hand facing down toward the floor. Switch arms.  Resisted wrist extension    1. Sit leaning forward with your legs slightly spread. Then place your affected forearm on your thigh with your hand and wrist in front of your knee.  2. Grasp one end of an exercise band with your palm down, and step on the other end.  3. Slowly bend your wrist upward for a count of 2, then lower your wrist slowly to a count of 5.  4. Repeat 8 to 12 times.   Resisted wrist flexion    1. Sit leaning forward with your legs slightly spread. Then place your affected forearm on your thigh with your hand and wrist in front of your knee.  2. Grasp one end of an exercise band with your palm up, and step on the other end.  3. Slowly bend your wrist upward for a count of 2, then lower your wrist slowly to a count of 5.  4. Repeat 8 to 12 times.  Neck stretch to the side    1. This stretch works best if you keep your shoulder down as you lean away from it. To help you remember to do this, start by relaxing your shoulders and lightly holding on to your thighs or your chair.  2. Tilt your head away from your affected elbow and toward your opposite shoulder. For example, if your right elbow is sore, keep your right shoulder down as you lean your head toward your left shoulder.  3. Hold for 15 to 30 seconds. Let the weight of your head stretch your muscles.  4. If you would like a little added stretch, use your hand to gently and steadily pull your head toward your shoulder. For example, if your right elbow is sore, use your left hand to gently pull your head toward your left shoulder.  5. Repeat 2 to 4 times.  Resisted forearm pronation    1. Sit leaning forward with your legs slightly spread. Then place your affected forearm on your thigh with your hand and wrist in front of your knee.  2. Grasp one end of an exercise band with your palm up, and step on the other end.  3. Keeping your wrist straight, roll your palm inward toward your thigh for a count of 2, then slowly move your wrist back to the starting position to a count of 5.  4. Repeat 8 to 12 times.  Resisted supination    1. Sit leaning forward with your legs slightly spread. Then place your affected forearm on your thigh with your hand and wrist in front of your knee.  2. Grasp one end of an exercise band with your palm down, and step on the other end.   3. Keeping your wrist straight, roll your palm outward and away from your thigh for a count of 2, then slowly move your wrist back to the starting position to a count of 5.  4. Repeat 8 to 12 times.  Follow-up care is a key part of your treatment and safety. Be sure to make and go to all appointments, and call your doctor if you are having problems. It's also a good idea to know your test results and keep a list of the medicines you take.  Where can you learn more?  Go to [employer]Stats.cahttp://www.healthwise.net/[employer]  Enter H459 in the search box to learn more about "Golfer's Elbow: Exercises."  ?? 2006-2016 [employer]. Care instructions adapted under license by [employer] (which disclaims liability or warranty for this information). This care instruction is for use with your licensed healthcare professional. If you have questions about a medical condition or this instruction, always ask your healthcare professional. [employer]wise, [employer] disclaims any warranty or liability for your use of this information.  Content Version: 11.0.578772; Current as of: Apr 27, 2015

## 2015-10-14 NOTE — Telephone Encounter (Signed)
Patient in office today requesting refills.

## 2015-10-14 NOTE — Progress Notes (Signed)
HISTORY OF PRESENT ILLNESS  Cynthia Marsh is a 64 y.o. female.  HPI  64 year old established female patient in today for an ongoing concern.    Patient reports hat initially she had lip swelling but that has resolved.  She was exposed to peanut butter and has a peanut allergy.    She would like to discuss leg pain, left, 8/10, stabbing, starts in ankle and does not radiate.    She also reports N/T in hands which wake her from sleep.  Allergies   Allergen Reactions   ??? 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   ??? Cozaar [Losartan] Cough   ??? Crestor [Rosuvastatin] Unknown (comments)     Heart beats fast       Past Medical History   Diagnosis Date   ??? Anxiety    ??? Asthma    ??? Chronic kidney disease    ??? Depression    ??? Diabetes (HCC)    ??? Hypertension    ??? Insomnia    ??? Osteoporosis        Family History   Problem Relation Age of Onset   ??? Diabetes Mother    ??? Diabetes Brother        Social History   Substance Use Topics   ??? Smoking status: Former Smoker   ??? Smokeless tobacco: Never Used   ??? Alcohol use Yes      Comment: on occasion        Current Outpatient Prescriptions   Medication Sig   ??? pregabalin (LYRICA) 50 mg capsule Take 1 Cap by mouth three (3) times daily. Max Daily Amount: 150 mg.   ??? montelukast (SINGULAIR) 10 mg tablet TAKE 1 TABLET BY MOUTH DAILY   ??? amLODIPine (NORVASC) 10 mg tablet TAKE 1 TABLET BY MOUTH DAILY   ??? oxyCODONE-acetaminophen (PERCOCET 10) 10-325 mg per tablet Take 1 Tab by mouth every six (6) hours as needed for Pain. Max Daily Amount: 4 Tabs.   ??? ALPRAZolam (XANAX) 0.5 mg tablet TAKE 1 TABLET BY MOUTH TWICE DAILY. MAX DAILY AMOUNT:    ??? ramipril (ALTACE) 5 mg capsule Take 1 Cap by mouth daily.   ??? fexofenadine (ALLEGRA) 180 mg tablet Take 180 mg by mouth daily.   ??? cyclobenzaprine (FLEXERIL) 10 mg tablet Take 0.5-1 Tabs by mouth three (3) times daily as needed for Muscle Spasm(s).   ??? azelastine (ASTELIN) 137 mcg (0.1 %) nasal spray 2 Sprays by Both  Nostrils route two (2) times a day.   ??? pravastatin (PRAVACHOL) 40 mg tablet Take 1 Tab by mouth nightly.   ??? oxybutynin chloride XL (DITROPAN XL) 10 mg CR tablet Take 1 Tab by mouth daily.   ??? Lancets misc by Does Not Apply route.   ??? topiramate (TOPAMAX) 25 mg tablet 1 tab daily for 1 week then BID   ??? mometasone (NASONEX) 50 mcg/actuation nasal spray 2 Sprays by Both Nostrils route daily.   ??? FREESTYLE LITE STRIPS strip USE TO CHECK BLOOD SUGAR TWICE DAILY   ??? FREESTYLE LANCETS 28 gauge misc CHECK BLOOD SUGAR TWICE DAILY   ??? metFORMIN (GLUCOPHAGE) 1,000 mg tablet TAKE 1 TABLET BY MOUTH TWICE DAILY WITH MEALS   ??? mirtazapine (REMERON) 30 mg tablet Take 30 mg by mouth nightly.   ??? calcium citrate-vitamin d3 (CITRACAL+D) 315-200 mg-unit tab Take 1 Tab by mouth daily (with breakfast).   ??? busPIRone (BUSPAR) 15 mg tablet Take 15 mg by  mouth three (3) times daily.   ??? zolpidem (AMBIEN) 10 mg tablet Take 10 mg by mouth nightly.   ??? FLUoxetine (PROZAC) 40 mg capsule Take 40 mg by mouth daily.   ??? hydrochlorothiazide (HYDRODIURIL) 25 mg tablet Take 25 mg by mouth daily.   ??? albuterol (PROVENTIL HFA, VENTOLIN HFA, PROAIR HFA) 90 mcg/actuation inhaler Take 2 Puffs by inhalation every six (6) hours as needed for Shortness of Breath.   ??? QVAR 80 mcg/actuation inhaler Take 1 Puff by inhalation two (2) times a day.   ??? EPINEPHrine (EPIPEN) 0.3 mg/0.3 mL injection 0.3 mg by IntraMUSCular route once as needed for Allergic Response.   ??? meclizine (ANTIVERT) 25 mg tablet Take 1 Tab by mouth three (3) times daily as needed. Indications: VERTIGO     No current facility-administered medications for this visit.         Past Surgical History   Procedure Laterality Date   ??? Pr anesth,surgery of shoulder     ??? Pr hand/finger surgery unlisted     ??? Pr laminectomy,cervical     ??? Hx cyst removal       calf    ??? Hx gyn       complete hyst    ??? Hx gyn       tubal ligation   ??? Hx wrist fracture tx     ??? Hx back surgery  2012      L4/L5/S1 fused in [place]   ??? Hx orthopaedic       Right shoulder rotator cuff    ??? Hx orthopaedic       Left Little toe hammer toe, left big toe bunion removed   ??? Hx orthopaedic       Right knee torn meniscus   ??? Hx orthopaedic       CTS bilateral , Left thumb trigger finger    ??? Pr breast surgery procedure unlisted        Left cyst removed        ROS  See HPI  Visit Vitals   ??? BP 92/65 (BP 1 Location: Left arm)   ??? Pulse 86   ??? Temp 97.1 ??F (36.2 ??C) (Oral)   ??? Resp 16   ??? Ht 5\' 4"  (1.626 m)   ??? Wt 172 lb (78 kg)   ??? SpO2 96%   ??? BMI 29.52 kg/m2     Physical Exam   Musculoskeletal:        Left elbow: She exhibits normal range of motion. Tenderness found. Medial epicondyle tenderness noted.        Cervical back: She exhibits normal range of motion, no tenderness, no bony tenderness, no swelling, no edema, no deformity, no laceration, no pain, no spasm and normal pulse.        Left foot: There is tenderness. There is normal range of motion, no bony tenderness and no swelling.   Mild tenderness in the posterior left ankle.          Diabetic foot exam performed by TRJ    Measurement  Response Physician Comment   Monofilament  R - 6/6  L - reduced sensation with micro filament    Pulse DP R - present  L - present    Vibratory R - normal  L -normal    Pulse TP R - present  L - present    Structural deformity R - None  L - None    Skin Integrity / Deformity  R - None  L - None       Reviewed by: Baruch Goldmann, DO       ASSESSMENT and PLAN    ICD-10-CM ICD-9-CM    1. Peripheral neuropathic pain G62.9 356.9 pregabalin (LYRICA) 50 mg capsule      DISCONTINUED: gabapentin (NEURONTIN) 300 mg capsule   2. Medial epicondylitis of elbow, left M77.02 726.31      -Advised symptomatic care  -RTC prn    Additional Instructions:  The patient understands that they should contact the office at any time if any questions or concerns develop.  They are also aware that they can call  our main office number at [phone] at any time if they would like to address any concerns with the physician.  They also understand that they should dial 911 if any acute emergency arises.  The patient understands that they should give [place] a minimum of 48 hours to complete prescription refills once they are requested.  The patient has also been instructed to contact us by calling the main office number if they have not received feedback within 2 weeks of having any tests completed.  The patient is a aware that they should read all package insert information when picking up the medications and that they should consult the pharmacist of a physician if they have any questions or concerns regarding the prescribed medications.  Discussed with the patient new medications given and patient instructed to read pharmacy literature regarding side effects and drug interactions.  Instructions for taking the medications were provided to the patient and the consequences of not taking it.    Follow-up Disposition:   Return if symptoms worsen or fail to improve.   Risk and benefits of new medication discussed in detail when indicated, patient was given the opportunity to ask questions   AVS provided  reviewed diet, exercise and weight control when indicated  Alarm signals discussed. ER precautions reviewed when indicated  Plan of care reviewed with patient. Understanding verbalized and they are in agreement with plan of care.     Baruch Goldmann, DO

## 2015-10-14 NOTE — Progress Notes (Signed)
Pt is here for rash on lip after eating peanut butter.     Do you have an advance directive yes    Pt declined mychart activation.    1. Have you been to the ER, urgent care clinic since your last visit?  Hospitalized since your last visit?No    2. Have you seen or consulted any other health care providers outside of the [facility] since your last visit?  Include any pap smears or colon screening. No      Called [facility] & LM on Pharmacy line to cancel gabapention per verbral order Dr Shelle Ironeresa Wright.

## 2015-10-22 ENCOUNTER — Encounter: Attending: Internal Medicine | Primary: Family Medicine

## 2015-10-28 NOTE — Telephone Encounter (Signed)
Pt is calling stating that she has been having issues with frequent urination and is unsure if it is caused by the cyst on her spine or if she she needs to see a urologist. Please advise [phone].

## 2015-10-30 NOTE — Telephone Encounter (Signed)
Last seen 09/07/2015 by Dr. Wilford CornerArora with follow-up disposition noted to return if symptoms worsen or fail to improve. Please advise.

## 2015-10-30 NOTE — Telephone Encounter (Signed)
Called pt and left voice message to follow up with her family doctor regarding the symptoms she is having.

## 2015-10-30 NOTE — Telephone Encounter (Signed)
She needs to see her PCP and make sure she does not have a UTI that can cause frequent urination

## 2015-10-30 NOTE — Telephone Encounter (Signed)
Pt returning call from Tori, advised her of message below and patient expressed understanding. No further action needed.

## 2015-10-30 NOTE — Telephone Encounter (Signed)
Noted, thank you.

## 2015-11-02 ENCOUNTER — Encounter: Attending: Internal Medicine | Primary: Family Medicine

## 2015-11-05 ENCOUNTER — Encounter: Attending: Internal Medicine | Primary: Family Medicine

## 2015-11-06 ENCOUNTER — Emergency Department: Admit: 2015-11-06 | Payer: PRIVATE HEALTH INSURANCE | Primary: Family Medicine

## 2015-11-06 ENCOUNTER — Inpatient Hospital Stay
Admit: 2015-11-06 | Discharge: 2015-11-09 | Disposition: A | Discharge status: Home / Self Care | Payer: PRIVATE HEALTH INSURANCE | Attending: Internal Medicine | Admitting: Internal Medicine

## 2015-11-06 DIAGNOSIS — I2699 Other pulmonary embolism without acute cor pulmonale: Principal | ICD-10-CM

## 2015-11-06 LAB — D-DIMER, QUANTITATIVE: D-Dimer, Quant: 0.58 ug/ml(FEU) — ABNORMAL HIGH (ref <0.46)

## 2015-11-06 LAB — CARDIAC PANEL,(CK, CKMB & TROPONIN)
CK - MB: <0.5 ng/ml — ABNORMAL LOW (ref 0.5–3.6)
CK - MB: 0.6 ng/ml (ref 0.5–3.6)
CK-MB Index: 1.5 % (ref 0.0–4.0)
CK: 46 U/L (ref 26–192)
CK: 39 U/L (ref 26–192)
Troponin-I, QT: <0.02 NG/ML (ref 0.0–0.045)
Troponin-I, QT: <0.02 NG/ML (ref 0.0–0.045)

## 2015-11-06 LAB — CBC WITH AUTOMATED DIFF
ABS. BASOPHILS: 0 10*3/uL (ref 0.0–0.06)
ABS. EOSINOPHILS: 0.1 10*3/uL (ref 0.0–0.4)
ABS. LYMPHOCYTES: 1.6 10*3/uL (ref 0.9–3.6)
ABS. MONOCYTES: 0.4 10*3/uL (ref 0.05–1.2)
ABS. NEUTROPHILS: 3 10*3/uL (ref 1.8–8.0)
BASOPHILS: 0 % (ref 0–2)
EOSINOPHILS: 2 % (ref 0–5)
HCT: 40.1 % (ref 35.0–45.0)
HGB: 13 g/dL (ref 12.0–16.0)
LYMPHOCYTES: 31 % (ref 21–52)
MCH: 27.3 PG (ref 24.0–34.0)
MCHC: 32.4 g/dL (ref 31.0–37.0)
MCV: 84.2 FL (ref 74.0–97.0)
MONOCYTES: 7 % (ref 3–10)
MPV: 9.3 FL (ref 9.2–11.8)
NEUTROPHILS: 60 % (ref 40–73)
PLATELET: 305 10*3/uL (ref 135–420)
RBC: 4.76 M/uL (ref 4.20–5.30)
RDW: 14.4 % (ref 11.6–14.5)
WBC: 5.1 10*3/uL (ref 4.6–13.2)

## 2015-11-06 LAB — EKG, 12 LEAD, SUBSEQUENT
Atrial Rate: 89 {beats}/min
Calculated P Axis: 59 degrees
Calculated R Axis: 2 degrees
Calculated T Axis: 16 degrees
Diagnosis: NORMAL
P-R Interval: 150 ms
Q-T Interval: 418 ms
QRS Duration: 66 ms
QTC Calculation (Bezet): 508 ms
Ventricular Rate: 89 {beats}/min

## 2015-11-06 LAB — METABOLIC PANEL, COMPREHENSIVE
A-G Ratio: 1 (ref 0.8–1.7)
ALT (SGPT): 32 U/L (ref 13–56)
AST (SGOT): 17 U/L (ref 15–37)
Albumin: 4 g/dL (ref 3.4–5.0)
Alk. phosphatase: 67 U/L (ref 45–117)
Anion gap: 10 mmol/L (ref 3.0–18)
BUN/Creatinine ratio: 26 — ABNORMAL HIGH (ref 12–20)
BUN: 20 MG/DL — ABNORMAL HIGH (ref 7.0–18)
Bilirubin, total: 0.5 MG/DL (ref 0.2–1.0)
CO2: 29 mmol/L (ref 21–32)
Calcium: 10.1 MG/DL (ref 8.5–10.1)
Chloride: 101 mmol/L (ref 100–108)
Creatinine: 0.78 MG/DL (ref 0.6–1.3)
GFR est AA: >60 mL/min/{1.73_m2} (ref >60)
GFR est non-AA: >60 mL/min/{1.73_m2} (ref >60)
Globulin: 4 g/dL (ref 2.0–4.0)
Glucose: 107 mg/dL — ABNORMAL HIGH (ref 74–99)
Potassium: 3.9 mmol/L (ref 3.5–5.5)
Protein, total: 8 g/dL (ref 6.4–8.2)
Sodium: 140 mmol/L (ref 136–145)

## 2015-11-06 LAB — EKG, 12 LEAD, INITIAL
Atrial Rate: 88 {beats}/min
Calculated P Axis: 53 degrees
Calculated R Axis: -10 degrees
Calculated T Axis: 15 degrees
Diagnosis: NORMAL
P-R Interval: 144 ms
Q-T Interval: 406 ms
QRS Duration: 72 ms
QTC Calculation (Bezet): 491 ms
Ventricular Rate: 88 {beats}/min

## 2015-11-06 LAB — MAGNESIUM: Magnesium: 1.8 mg/dL (ref 1.8–2.4)

## 2015-11-06 LAB — PTT: aPTT: 25.4 s (ref 23.0–36.4)

## 2015-11-06 LAB — D DIMER: D DIMER: 0.58 ug/ml(FEU) — ABNORMAL HIGH (ref <0.46)

## 2015-11-06 LAB — GLUCOSE, POC: Glucose (POC): 80 mg/dL (ref 70–110)

## 2015-11-06 MED ORDER — INSULIN LISPRO 100 UNIT/ML INJECTION
100 unit/mL | Freq: Four times a day (QID) | SUBCUTANEOUS | Status: DC
Start: 2015-11-06 — End: 2015-11-09
  Administered 2015-11-08 – 2015-11-09 (×3): via SUBCUTANEOUS

## 2015-11-06 MED ORDER — ASPIRIN 81 MG CHEWABLE TAB
81 mg | ORAL | Status: AC
Start: 2015-11-06 — End: 2015-11-06
  Administered 2015-11-06: 16:00:00 via ORAL

## 2015-11-06 MED ORDER — BUSPIRONE 5 MG TAB
5 mg | Freq: Three times a day (TID) | ORAL | Status: DC
Start: 2015-11-06 — End: 2015-11-09
  Administered 2015-11-07 – 2015-11-09 (×8): via ORAL

## 2015-11-06 MED ORDER — FLUTICASONE 50 MCG/ACTUATION NASAL SPRAY, SUSP
50 mcg/actuation | Freq: Every day | NASAL | Status: DC
Start: 2015-11-06 — End: 2015-11-09
  Administered 2015-11-07 – 2015-11-09 (×3): via NASAL

## 2015-11-06 MED ORDER — PRAVASTATIN 20 MG TAB
20 mg | Freq: Every evening | ORAL | Status: DC
Start: 2015-11-06 — End: 2015-11-09
  Administered 2015-11-07 – 2015-11-09 (×3): via ORAL

## 2015-11-06 MED ORDER — ENOXAPARIN 120 MG/0.8 ML SUB-Q SYRINGE
120 mg/0.8 mL | Freq: Two times a day (BID) | SUBCUTANEOUS | Status: DC
Start: 2015-11-06 — End: 2015-11-07
  Administered 2015-11-07 (×2): via SUBCUTANEOUS

## 2015-11-06 MED ORDER — PREGABALIN 50 MG CAP
50 mg | Freq: Three times a day (TID) | ORAL | Status: DC
Start: 2015-11-06 — End: 2015-11-09
  Administered 2015-11-07 – 2015-11-09 (×8): via ORAL

## 2015-11-06 MED ORDER — OXYBUTYNIN CHLORIDE SR 5 MG 24 HR TAB
5 mg | Freq: Every day | ORAL | Status: DC
Start: 2015-11-06 — End: 2015-11-09
  Administered 2015-11-07 – 2015-11-09 (×3): via ORAL

## 2015-11-06 MED ORDER — FLUTICASONE FUROATE 100 MCG/ACTUATION BREATH ACTIVATED INHALER
100 mcg/actuation | Freq: Every day | RESPIRATORY_TRACT | Status: DC
Start: 2015-11-06 — End: 2015-11-09
  Administered 2015-11-07 – 2015-11-09 (×5): via RESPIRATORY_TRACT

## 2015-11-06 MED ORDER — MONTELUKAST 10 MG TAB
10 mg | Freq: Every evening | ORAL | Status: DC
Start: 2015-11-06 — End: 2015-11-09
  Administered 2015-11-07 – 2015-11-09 (×3): via ORAL

## 2015-11-06 MED ORDER — LORATADINE 10 MG TAB
10 mg | Freq: Every day | ORAL | Status: DC
Start: 2015-11-06 — End: 2015-11-09
  Administered 2015-11-08 – 2015-11-09 (×2): via ORAL

## 2015-11-06 MED ORDER — SODIUM CHLORIDE 0.9 % IJ SYRG
Freq: Three times a day (TID) | INTRAMUSCULAR | Status: DC
Start: 2015-11-06 — End: 2015-11-09
  Administered 2015-11-07 – 2015-11-09 (×8): via INTRAVENOUS

## 2015-11-06 MED ORDER — ACETAMINOPHEN 325 MG TABLET
325 mg | ORAL | Status: DC | PRN
Start: 2015-11-06 — End: 2015-11-09

## 2015-11-06 MED ORDER — NITROGLYCERIN 2 % TRANSDERMAL OINTMENT
2 % | TRANSDERMAL | Status: AC
Start: 2015-11-06 — End: 2015-11-06
  Administered 2015-11-06: 16:00:00 via TOPICAL

## 2015-11-06 MED ORDER — ZOLPIDEM 5 MG TAB
5 mg | Freq: Every evening | ORAL | Status: DC
Start: 2015-11-06 — End: 2015-11-09
  Administered 2015-11-07 – 2015-11-09 (×3): via ORAL

## 2015-11-06 MED ORDER — CALCIUM CITRATE-VITAMIN D3 315 MG-250 UNIT TAB
315 mg-6.25 mcg (250 unit) | Freq: Every day | ORAL | Status: DC
Start: 2015-11-06 — End: 2015-11-09
  Administered 2015-11-07 – 2015-11-09 (×3): via ORAL

## 2015-11-06 MED ORDER — GLUCOSE 4 GRAM CHEWABLE TAB
4 gram | ORAL | Status: DC | PRN
Start: 2015-11-06 — End: 2015-11-09

## 2015-11-06 MED ORDER — FLUOXETINE 20 MG CAP
20 mg | Freq: Every day | ORAL | Status: DC
Start: 2015-11-06 — End: 2015-11-09
  Administered 2015-11-07 – 2015-11-09 (×3): via ORAL

## 2015-11-06 MED ORDER — DEXTROSE 50% IN WATER (D50W) IV SYRG
INTRAVENOUS | Status: DC | PRN
Start: 2015-11-06 — End: 2015-11-09

## 2015-11-06 MED ORDER — KETOROLAC TROMETHAMINE 30 MG/ML INJECTION
30 mg/mL (1 mL) | INTRAMUSCULAR | Status: AC
Start: 2015-11-06 — End: 2015-11-06
  Administered 2015-11-06: 18:00:00 via INTRAVENOUS

## 2015-11-06 MED ORDER — MORPHINE 4 MG/ML SYRINGE
4 mg/mL | INTRAMUSCULAR | Status: AC
Start: 2015-11-06 — End: 2015-11-06
  Administered 2015-11-06: 18:00:00 via INTRAVENOUS

## 2015-11-06 MED ORDER — IOPAMIDOL 61 % IV SOLN
300 mg iodine /mL (61 %) | Freq: Once | INTRAVENOUS | Status: DC
Start: 2015-11-06 — End: 2015-11-06
  Administered 2015-11-06: 19:00:00 via INTRAVENOUS

## 2015-11-06 MED ORDER — SODIUM CHLORIDE 0.9 % IJ SYRG
INTRAMUSCULAR | Status: DC | PRN
Start: 2015-11-06 — End: 2015-11-09

## 2015-11-06 MED ORDER — IOPAMIDOL 76 % IV SOLN
370 mg iodine /mL (76 %) | Freq: Once | INTRAVENOUS | Status: AC
Start: 2015-11-06 — End: 2015-11-06
  Administered 2015-11-06: 19:00:00 via INTRAVENOUS

## 2015-11-06 MED ORDER — OXYCODONE-ACETAMINOPHEN 10 MG-325 MG TAB
10-325 mg | Freq: Four times a day (QID) | ORAL | Status: DC | PRN
Start: 2015-11-06 — End: 2015-11-09
  Administered 2015-11-08 – 2015-11-09 (×4): via ORAL

## 2015-11-06 MED ORDER — ALBUTEROL SULFATE 0.083 % (0.83 MG/ML) SOLN FOR INHALATION
2.5 mg /3 mL (0.083 %) | Freq: Four times a day (QID) | RESPIRATORY_TRACT | Status: DC | PRN
Start: 2015-11-06 — End: 2015-11-09

## 2015-11-06 MED ORDER — ALBUTEROL SULFATE HFA 90 MCG/ACTUATION AEROSOL INHALER
90 mcg/actuation | Freq: Four times a day (QID) | RESPIRATORY_TRACT | Status: DC | PRN
Start: 2015-11-06 — End: 2015-11-06

## 2015-11-06 MED ORDER — GLUCAGON 1 MG INJECTION
1 mg | INTRAMUSCULAR | Status: DC | PRN
Start: 2015-11-06 — End: 2015-11-09

## 2015-11-06 MED ORDER — AZELASTINE 137 MCG NASAL SPRAY AEROSOL
137 mcg (0.1 %) | Freq: Two times a day (BID) | NASAL | Status: DC
Start: 2015-11-06 — End: 2015-11-09
  Administered 2015-11-07 – 2015-11-09 (×5): via NASAL

## 2015-11-06 MED ORDER — ALPRAZOLAM 0.5 MG TAB
0.5 mg | Freq: Two times a day (BID) | ORAL | Status: DC
Start: 2015-11-06 — End: 2015-11-09
  Administered 2015-11-07 – 2015-11-09 (×6): via ORAL

## 2015-11-06 MED ORDER — MORPHINE 4 MG/ML SYRINGE
4 mg/mL | INTRAMUSCULAR | Status: AC
Start: 2015-11-06 — End: 2015-11-06
  Administered 2015-11-06: 21:00:00 via INTRAVENOUS

## 2015-11-06 MED ORDER — MIRTAZAPINE 15 MG TAB
15 mg | Freq: Every evening | ORAL | Status: DC
Start: 2015-11-06 — End: 2015-11-09
  Administered 2015-11-07 – 2015-11-09 (×3): via ORAL

## 2015-11-06 MED ORDER — HEPARIN (PORCINE) 1,000 UNIT/ML IJ SOLN
1000 unit/mL | Freq: Once | INTRAMUSCULAR | Status: DC
Start: 2015-11-06 — End: 2015-11-06
  Administered 2015-11-06: 19:00:00 via INTRAVENOUS

## 2015-11-06 MED ORDER — HEPARIN (PORCINE) IN D5W 25,000 UNIT/250 ML IV
25000 unit/250 mL(100 unit/mL) | INTRAVENOUS | Status: DC
Start: 2015-11-06 — End: 2015-11-06

## 2015-11-06 MED FILL — BD POSIFLUSH NORMAL SALINE 0.9 % INJECTION SYRINGE: INTRAMUSCULAR | Qty: 10

## 2015-11-06 MED FILL — ISOVUE-370  76 % INTRAVENOUS SOLUTION: 370 mg iodine /mL (76 %) | INTRAVENOUS | Qty: 100

## 2015-11-06 MED FILL — ENOXAPARIN 80 MG/0.8 ML SUB-Q SYRINGE: 80 mg/0.8 mL | SUBCUTANEOUS | Qty: 0.8

## 2015-11-06 MED FILL — NITRO-BID 2 % TRANSDERMAL OINTMENT: 2 % | TRANSDERMAL | Qty: 1

## 2015-11-06 MED FILL — ASPIRIN 81 MG CHEWABLE TAB: 81 mg | ORAL | Qty: 4

## 2015-11-06 MED FILL — ISOVUE-300  61 % INTRAVENOUS SOLUTION: 300 mg iodine /mL (61 %) | INTRAVENOUS | Qty: 100

## 2015-11-06 MED FILL — MORPHINE 4 MG/ML SYRINGE: 4 mg/mL | INTRAMUSCULAR | Qty: 1

## 2015-11-06 MED FILL — KETOROLAC TROMETHAMINE 30 MG/ML INJECTION: 30 mg/mL (1 mL) | INTRAMUSCULAR | Qty: 1

## 2015-11-06 NOTE — ED Triage Notes (Signed)
Pt, c/o chest pain on & off  Since Tuesday, denies shirt ness of breath

## 2015-11-06 NOTE — ED Notes (Signed)
Report given to tara rn at bedside  pt stable on cardiac monitor, pt reports pain dr Awilda Metroberhanu at bedside, VSS

## 2015-11-06 NOTE — ED Provider Notes (Addendum)
HPI Comments: 10:00 AM Cynthia Marsh is a 64 y.o. female presenting to the ED with intermittent CP that began 3 days ago. She also reports dry cough as an associated symptom. Pt describes the pain as "sharp" and radiates to the collar bone. She states the pain lasts a few seconds, this morning it began while laying in the bed. She states that certain movements worsen the pain and applying pressure helps alleviate the pain. She did not take any aspirin when the pain began this morning. Pt denies nausea, vomiting, diarrhea, fever, diaphoresis, and cough. No other complaints at this time.               Patient is a 64 y.o. female presenting with chest pain. The history is provided by the patient.   Chest Pain (Angina)    Pertinent negatives include no abdominal pain, no cough, no fever, no headaches, no nausea, no shortness of breath and no vomiting.        Past Medical History:   Diagnosis Date   ??? Anxiety    ??? Asthma    ??? Chronic kidney disease    ??? Depression    ??? Diabetes ([place])    ??? Hypertension    ??? Insomnia    ??? Osteoporosis        Past Surgical History:   Procedure Laterality Date   ??? Pr anesth,surgery of shoulder     ??? Pr hand/finger surgery unlisted     ??? Pr laminectomy,cervical     ??? Hx cyst removal       calf    ??? Hx gyn       complete hyst    ??? Hx gyn       tubal ligation   ??? Hx wrist fracture tx     ??? Hx back surgery  2012     L4/L5/S1 fused in [place]   ??? Hx orthopaedic       Right shoulder rotator cuff    ??? Hx orthopaedic       Left Little toe hammer toe, left big toe bunion removed   ??? Hx orthopaedic       Right knee torn meniscus   ??? Hx orthopaedic       CTS bilateral , Left thumb trigger finger    ??? Pr breast surgery procedure unlisted        Left cyst removed          Family History:   Problem Relation Age of Onset   ??? Diabetes Mother    ??? Diabetes Brother        Social History     Social History   ??? Marital status: SINGLE     Spouse name: N/A   ??? Number of children: N/A   ??? Years of education: N/A      Occupational History   ??? Not on file.     Social History Main Topics   ??? Smoking status: Former Smoker   ??? Smokeless tobacco: Never Used   ??? Alcohol use Yes      Comment: on occasion   ??? Drug use: Not on file   ??? Sexual activity: Not on file     Other Topics Concern   ??? Not on file     Social History Narrative         ALLERGIES: Nuts [tree nut]; Cozaar [losartan]; and Crestor [rosuvastatin]    Review of Systems   Constitutional: Negative for fever.  HENT: Negative for congestion.    Respiratory: Negative for cough and shortness of breath.    Cardiovascular: Positive for chest pain. Negative for leg swelling.   Gastrointestinal: Negative for abdominal pain, nausea and vomiting.   Genitourinary: Negative for dysuria.   Musculoskeletal: Negative.    Neurological: Negative for speech difficulty and headaches.   All other systems reviewed and are negative.      Vitals:    11/06/15 1200 11/06/15 1215 11/06/15 1230 11/06/15 1245   BP: 95/74 108/77 110/86 105/85   Pulse: 90 89 88 91   Resp: _0 Temp:       SpO2: 96% 97% 98% 96%   Weight:       Height:                Physical Exam   Constitutional: She is oriented to person, place, and time. She appears well-developed and well-nourished. No distress.   HENT:   Head: Normocephalic and atraumatic.   Mouth/Throat: Oropharynx is clear and moist.   Eyes: Conjunctivae and EOM are normal. Pupils are equal, round, and reactive to light. No scleral icterus.   Neck: Trachea normal. Neck supple. No JVD present. No thyromegaly present.   Cardiovascular: Normal rate, regular rhythm, S1 normal and S2 normal.  Exam reveals no gallop and no friction rub.    No murmur heard.  Pulmonary/Chest: Effort normal and breath sounds normal. No accessory muscle usage. No respiratory distress.   Abdominal: Soft. Normal appearance. She exhibits no distension. There is no tenderness. There is no rigidity, no rebound and no guarding.    Musculoskeletal: She exhibits edema (trace RLE edema). She exhibits no tenderness.   R knee in immobilizer    Neurological: She is alert and oriented to person, place, and time. She has normal strength. No cranial nerve deficit or sensory deficit. Coordination normal.   Skin: Skin is warm and intact. No rash noted.   Psychiatric: She has a normal mood and affect. Her speech is normal and behavior is normal.   Vitals reviewed.       MDM  Number of Diagnoses or Management Options  Atypical chest pain:   Other acute pulmonary embolism without acute cor pulmonale [facility]):   Diagnosis management comments: Ryleigh Esqueda is a 64 y.o. Female coming in with atypical CP. Had a brief episode of hypoxia which resolved on its own. D dimer was added and positive. Small PE found in LLL. Will admit and start anticoagulation.    ED Course       Procedures    Vitals:  Patient Vitals for the past 12 hrs:   Temp Pulse Resp BP SpO2   11/06/15 1245 - 91 13 105/85 96 %   11/06/15 1230 - 88 13 110/86 98 %   11/06/15 1215 - 89 14 108/77 97 %   11/06/15 1200 - 90 16 95/74 96 %   11/06/15 1145 - 89 15 97/81 98 %   11/06/15 1130 - 91 8 98/66 96 %   11/06/15 1115 - 85 11 108/69 99 %   11/06/15 1100 - 88 16 114/79 99 %   11/06/15 1045 - 88 15 107/80 96 %   11/06/15 1030 - 85 18 104/86 96 %   11/06/15 1015 - 85 12 115/87 100 %   11/06/15 1000 - 90 18 (!) 121/96 100 %   11/06/15 0949 97.9 ??F (36.6 ??C) 70 18 127/76 97 %   Pulsox interpreted within  normal limits.       Medications ordered:   Medications   enoxaparin (LOVENOX) injection 80 mg (not administered)   aspirin chewable tablet 324 mg (324 mg Oral Given 11/06/15 1038)   nitroglycerin (NITROBID) 2 % ointment 1 Inch (1 Inch Topical Given 11/06/15 1038)   ketorolac (TORADOL) injection 30 mg (30 mg IntraVENous Given 11/06/15 1301)   morphine injection 2 mg (2 mg IntraVENous Given 11/06/15 1301)   iopamidol (ISOVUE-370) 76 % injection 75-100 mL (80 mL IntraVENous Given 11/06/15 1352)          Lab findings:  Recent Results (from the past 12 hour(s))   EKG, 12 LEAD, INITIAL    Collection Time: 11/06/15  9:56 AM   Result Value Ref Range    Ventricular Rate 88 BPM    Atrial Rate 88 BPM    P-R Interval 144 ms    QRS Duration 72 ms    Q-T Interval 406 ms    QTC Calculation (Bezet) 491 ms    Calculated P Axis 53 degrees    Calculated R Axis -10 degrees    Calculated T Axis 15 degrees    Diagnosis       Normal sinus rhythm  Possible Left atrial enlargement  Nonspecific ST and T wave abnormality  Prolonged QT  Abnormal ECG  When compared with ECG of 02-Jun-2015 13:07,  Nonspecific T wave abnormality now evident in Anterior leads  Confirmed by Elita Quick ([id number]) on 11/06/2015 2:12:14 PM     CBC WITH AUTOMATED DIFF    Collection Time: 11/06/15 10:32 AM   Result Value Ref Range    WBC 5.1 4.6 - 13.2 K/uL    RBC 4.76 4.20 - 5.30 M/uL    HGB 13.0 12.0 - 16.0 g/dL    HCT 40.1 35.0 - 45.0 %    MCV 84.2 74.0 - 97.0 FL    MCH 27.3 24.0 - 34.0 PG    MCHC 32.4 31.0 - 37.0 g/dL    RDW 14.4 11.6 - 14.5 %    PLATELET 305 135 - 420 K/uL    MPV 9.3 9.2 - 11.8 FL    NEUTROPHILS 60 40 - 73 %    LYMPHOCYTES 31 21 - 52 %    MONOCYTES 7 3 - 10 %    EOSINOPHILS 2 0 - 5 %    BASOPHILS 0 0 - 2 %    ABS. NEUTROPHILS 3.0 1.8 - 8.0 K/UL    ABS. LYMPHOCYTES 1.6 0.9 - 3.6 K/UL    ABS. MONOCYTES 0.4 0.05 - 1.2 K/UL    ABS. EOSINOPHILS 0.1 0.0 - 0.4 K/UL    ABS. BASOPHILS 0.0 0.0 - 0.06 K/UL    DF AUTOMATED     METABOLIC PANEL, COMPREHENSIVE    Collection Time: 11/06/15 10:32 AM   Result Value Ref Range    Sodium 140 136 - 145 mmol/L    Potassium 3.9 3.5 - 5.5 mmol/L    Chloride 101 100 - 108 mmol/L    CO2 29 21 - 32 mmol/L    Anion gap 10 3.0 - 18 mmol/L    Glucose 107 (H) 74 - 99 mg/dL    BUN 20 (H) 7.0 - 18 MG/DL    Creatinine 0.78 0.6 - 1.3 MG/DL    BUN/Creatinine ratio 26 (H) 12 - 20      GFR est AA >60 >60 ml/min/1.83m    GFR est non-AA >60 >60 ml/min/1.730m   Calcium 10.1 8.5 -  10.1 MG/DL    Bilirubin, total 0.5 0.2 - 1.0 MG/DL     ALT 32 13 - 56 U/L    AST 17 15 - 37 U/L    Alk. phosphatase 67 45 - 117 U/L    Protein, total 8.0 6.4 - 8.2 g/dL    Albumin 4.0 3.4 - 5.0 g/dL    Globulin 4.0 2.0 - 4.0 g/dL    A-G Ratio 1.0 0.8 - 1.7     MAGNESIUM    Collection Time: 11/06/15 10:32 AM   Result Value Ref Range    Magnesium 1.8 1.8 - 2.4 mg/dL   CARDIAC PANEL,(CK, CKMB & TROPONIN)    Collection Time: 11/06/15 10:32 AM   Result Value Ref Range    CK 46 26 - 192 U/L    CK - MB <0.5 (L) 0.5 - 3.6 ng/ml    CK-MB Index CANNOT BE CALCULATED 0.0 - 4.0 %    Troponin-I, Qt. <0.02 0.0 - 0.045 NG/ML   D DIMER    Collection Time: 11/06/15 10:32 AM   Result Value Ref Range    D DIMER 0.58 (H) <0.46 ug/ml(FEU)   CARDIAC PANEL,(CK, CKMB & TROPONIN)    Collection Time: 11/06/15  2:24 PM   Result Value Ref Range    CK 39 26 - 192 U/L    CK - MB 0.6 0.5 - 3.6 ng/ml    CK-MB Index 1.5 0.0 - 4.0 %    Troponin-I, Qt. <0.02 0.0 - 0.045 NG/ML   PTT    Collection Time: 11/06/15  2:24 PM   Result Value Ref Range    aPTT 25.4 23.0 - 36.4 SEC   EKG, 12 LEAD, SUBSEQUENT    Collection Time: 11/06/15  2:53 PM   Result Value Ref Range    Ventricular Rate 89 BPM    Atrial Rate 89 BPM    P-R Interval 150 ms    QRS Duration 66 ms    Q-T Interval 418 ms    QTC Calculation (Bezet) 508 ms    Calculated P Axis 59 degrees    Calculated R Axis 2 degrees    Calculated T Axis 16 degrees    Diagnosis       Normal sinus rhythm  Possible Left atrial enlargement  Nonspecific ST and T wave abnormality  Prolonged QT  Abnormal ECG  When compared with ECG of 06-Nov-2015 09:56,  No significant change was found         EKG interpretation by ED Physician:  10:00 sinus rhythm,  rate 88, T wave inversion in V3 and Lead 3, no ST segment elevations    X-Ray, CT or other radiology findings or impressions:    CTA CHEST W WO CONT   IMPRESSION:  ??  Positive PE left lower lobe.  ??  Report provided to the emergency department at 1414 hrs. Discussed with Dr.  Oletta Lamas , [facility] ??  Per Radiologist           XR CHEST PORT   IMPRESSION:  ??  Atherosclerosis.  Per Radiologist          Progress notes, Consult notes or additional Procedure notes:   3:05 PM Kathlen Mody, MD discussed care with Dr. Dossie Arbour [facility]) who suggest Lovenox instead of Heparin and agrees to admit . Standard discussion; including history of patient???s chief complaint, available diagnostic results, and treatment course.    Reevaluation of patient:   12:25 PM Pt has been re-examined by Evelena Leyden,  MD. Patient is resting on the stretcher in no acute distress. She was made aware of plan for CT.    Patient remains hemodynamically stable, agrees with admission and understands diagnosis.    Disposition:  Diagnosis:   1. Other acute pulmonary embolism without acute cor pulmonale ([place])    2. Atypical chest pain        Disposition: Admit    Follow-up Information     None           Patient's Medications   Start Taking    No medications on file   Continue Taking    ALBUTEROL (PROVENTIL HFA, VENTOLIN HFA, PROAIR HFA) 90 MCG/ACTUATION INHALER    Take 2 Puffs by inhalation every six (6) hours as needed for Shortness of Breath.    ALPRAZOLAM (XANAX) 0.5 MG TABLET    TAKE 1 TABLET BY MOUTH TWICE DAILY. MAX DAILY AMOUNT: 1MG    AMLODIPINE (NORVASC) 10 MG TABLET    TAKE 1 TABLET BY MOUTH DAILY    AZELASTINE (ASTELIN) 137 MCG (0.1 %) NASAL SPRAY    2 Sprays by Both Nostrils route two (2) times a day.    BUSPIRONE (BUSPAR) 15 MG TABLET    Take 15 mg by mouth three (3) times daily.    CALCIUM CITRATE-VITAMIN D3 (CITRACAL+D) 315-200 MG-UNIT TAB    Take 1 Tab by mouth daily (with breakfast).    CYCLOBENZAPRINE (FLEXERIL) 10 MG TABLET    Take 0.5-1 Tabs by mouth three (3) times daily as needed for Muscle Spasm(s).    EPINEPHRINE (EPIPEN) 0.3 MG/0.3 ML INJECTION    0.3 mg by IntraMUSCular route once as needed for Allergic Response.    FEXOFENADINE (ALLEGRA) 180 MG TABLET    Take 180 mg by mouth daily.     FLUOXETINE (PROZAC) 40 MG CAPSULE    Take 40 mg by mouth daily.    FREESTYLE LANCETS 28 GAUGE MISC    CHECK BLOOD SUGAR TWICE DAILY    FREESTYLE LITE STRIPS STRIP    USE TO CHECK BLOOD SUGAR TWICE DAILY    HYDROCHLOROTHIAZIDE (HYDRODIURIL) 25 MG TABLET    Take 25 mg by mouth daily.    LANCETS MISC    by Does Not Apply route.    MECLIZINE (ANTIVERT) 25 MG TABLET    Take 1 Tab by mouth three (3) times daily as needed. Indications: VERTIGO    METFORMIN (GLUCOPHAGE) 1,000 MG TABLET    TAKE 1 TABLET BY MOUTH TWICE DAILY WITH MEALS    MIRTAZAPINE (REMERON) 30 MG TABLET    Take 30 mg by mouth nightly.    MOMETASONE (NASONEX) 50 MCG/ACTUATION NASAL SPRAY    2 Sprays by Both Nostrils route daily.    MONTELUKAST (SINGULAIR) 10 MG TABLET    TAKE 1 TABLET BY MOUTH DAILY    OXYBUTYNIN CHLORIDE XL (DITROPAN XL) 10 MG CR TABLET    Take 1 Tab by mouth daily.    OXYCODONE-ACETAMINOPHEN (PERCOCET 10) 10-325 MG PER TABLET    Take 1 Tab by mouth every six (6) hours as needed for Pain. Max Daily Amount: 4 Tabs.    PRAVASTATIN (PRAVACHOL) 40 MG TABLET    Take 1 Tab by mouth nightly.    PREGABALIN (LYRICA) 50 MG CAPSULE    Take 1 Cap by mouth three (3) times daily. Max Daily Amount: 150 mg.    QVAR 80 MCG/ACTUATION INHALER    Take 1 Puff by inhalation two (2) times a day.    RAMIPRIL (  ALTACE) 5 MG CAPSULE    Take 1 Cap by mouth daily.    TOPIRAMATE (TOPAMAX) 25 MG TABLET    1 tab daily for 1 week then BID    ZOLPIDEM (AMBIEN) 10 MG TABLET    Take 10 mg by mouth nightly.   These Medications have changed    No medications on file   Stop Taking    No medications on file       SCRIBE ATTESTATION STATEMENT  Documented by: Ena Dawley scribing for, and in the presence of, Tanna Furry, MD 10:01 AM       PROVIDER ATTESTATION STATEMENT  I personally performed the services described in the documentation, reviewed the documentation, as recorded by the scribe in my presence, and it accurately and completely records my words and actions.   Tanna Furry, MD

## 2015-11-06 NOTE — ED Notes (Signed)
TRANSFER - OUT REPORT:    Verbal report given to tammy(name) on Rosalita LevanFrankie Dunnigan  being transferred to 2s(unit) for routine progression of care       Report consisted of patient???s Situation, Background, Assessment and   Recommendations(SBAR).     Information from the following report(s) SBAR was reviewed with the receiving nurse.    Lines:   Peripheral IV 11/06/15 Left Antecubital (Active)   Site Assessment Clean, dry, & intact 11/06/2015 12:55 PM   Phlebitis Assessment 0 11/06/2015 12:55 PM   Infiltration Assessment 0 11/06/2015 12:55 PM   Dressing Status Clean, dry, & intact 11/06/2015 12:55 PM   Hub Color/Line Status Blue;Flushed;Patent 11/06/2015 12:55 PM        Opportunity for questions and clarification was provided.      Patient transported with:   [employer]

## 2015-11-06 NOTE — H&P (Signed)
[facility]  ROUTINE H AND PS    Name:  TILDA, SAMUDIO  MR#:  [id number]  DOB:  11-06-1951  Account #:  [id number]  Date of Adm:  11/06/2015      PRIMARY CARE PHYSICIAN: Kennon Rounds, MD    CHIEF COMPLAINT: Chest pain.    HISTORY OF PRESENT ILLNESS: The patient is a 64 year old  female with no previous history of thromboembolism, comes in with a  chief complaint of left-sided chest pain which felt like a "pulled muscle."  This started about 3 days ago and progressively got worse. The chest  pain started on the left side of her chest and radiated to her collarbone  into the substernal region. Last night, it was especially worse and  continued to be worse when she woke up this morning. She presented  to the ER and was noted to have a finding of pulmonary embolism. We  have been asked to see the patient for further evaluation and  management. The patient reports that the pain is constant. It is worse  when she sits up. No clear associated symptoms, other than a cough  which she says she has had for several months.    The patient reports that she had traveled to [place] in a car,  which was about a 4-hour drive, for Thanksgiving several days ago.  After the travel, she said her right knee was swollen and she was seen  for it, I believe in an emergency room setting. She was placed in a  knee immobilizer and had been using the knee immobilizer since then.    REVIEW OF SYSTEMS: Please see above, otherwise 10-point review  of systems checked and negative.    PAST MEDICAL HISTORY  1. Type 2 diabetes mellitus.  2. Hypertension.  3. History of renal stones.  4. Anxiety disorder.  5. Depression.    PAST SURGICAL HISTORY  1. She has had a hysterectomy.  2. Rotator cuff repair.  3. Breast cyst removed, which she says was benign.  4. A cyst removed from her right calf, which she says was benign.    ALLERGIES  1. CRESTOR.  2. COZAAR.    SOCIAL HISTORY: She denies ever smoking cigarettes. She reports   she drinks alcohol occasionally. She denies recreational drugs. She is  on disability. She lives with her fiancee.    FAMILY HISTORY: The patient denies history of thromboembolism.  Mother with history of diabetes. She has a brother with history of  diabetes.    MEDICATIONS  1. Lyrica 50 mg 3 times a day.  2. Albuterol as needed.  3. QVAR 1 puff twice a day.  4. Singular 10 mg daily.  5. Norvasc 10 mg daily.  6. Percocet as needed.  7. Xanax 0.5 mg twice a day scheduled.  8. Altace 5 mg daily.  9. Allegra 180 daily.  10. EpiPen as needed.  11. Flexeril as needed.  12. Astelin 2 sprays both nostrils twice a day.  13. Pravachol 40 mg at night.  14. Ditropan XL 10 mg daily.  15. Topamax as needed.  16. Nasonex .  17. Metformin 1000 mg twice a day.  18. Remeron 30 mg at night.  19. Citracal.  20. Buspar 15 mg 3 times a day.  21. Ambien 10 mg at night.  22. Prozac 40 mg daily.  23. Hydrochlorothiazide 25 mg daily.    PHYSICAL EXAMINATION  VITAL SIGNS: She is afebrile, blood pressure low at  times, systolic 95  is the lowest, most current 105. The rest of her vital signs are  unremarkable.  GENERAL: She is a somewhat obese, very pleasant female, who  appears stated age, resting comfortably, in no acute distress.  PSYCHIATRIC: She is alert, awake, oriented. She has appropriate  affect. Recent and remote memory appear intact.  HEENT: Head is atraumatic, normocephalic. Sclerae anicteric.  Oropharynx without lesions. She has moist mucous membranes.  NECK: Supple.  LYMPHATICS: No neck or axillary lymphadenopathy.  CARDIOVASCULAR: Regular rate and rhythm. No murmurs. No  jugular venous distention. No peripheral edema.  PULMONARY: Clear to auscultation bilaterally.  GASTROINTESTINAL: Abdomen is soft, nontender, nondistended.  SKIN: Warm, dry, without lesions.  NEUROLOGICAL: Nonfocal.  MUSCULOSKELETAL: Unremarkable.    LABORATORY: CBC is unremarkable. Her electrolytes and renal   function all unremarkable. Liver testing unremarkable. She has 2 sets  of negative cardiac enzymes.    IMAGING STUDIES: Include a chest x-ray. Official read is  atherosclerosis. I have also seen this study independently and agree  with the official read.    She has a CT angiogram of the chest which showed that she had left  lower lobe pulmonary embolism.    She has duplex studies of her lower extremities bilateral which showed  no evidence of deep venous thrombosis in both legs.    Her EKG shows that she has normal sinus rhythm, no clear ST  elevations, QT prolongation.    IMPRESSION  1. Acute pulmonary embolism in this patient in the setting of recent car  travel and right knee immobilization secondary to knee pain. Here  currently she is stable from a respiratory standpoint. Her blood  pressure at times is low in the mid 90s systolic. Otherwise overall  appears to be stable.  2. History of type 2 diabetes mellitus on metformin, status post recent  contrast use. Metformin to be held.  3. History of hypertension.  4. History of depression and anxiety disorder. No active issues.    PLAN  1. At this time is to admit the patient, continue Lovenox that has been  started in the ER. The patient to get it a 2D echo of her heart. Continue  to monitor her hemodynamics. I am going to hold her blood pressure  medications, given her low-normal blood pressure at this time. Hold  her metformin, given recent IV contrast use. I am going to give her  corrective insulin for now. Given her blood sugar last measured is 107,  I am going to hold off on further long-acting medications to control her  glucose at this time.  2. Deep venous thrombosis prophylaxis. The patient on Lovenox.  3. Advanced directives. Discussed with patient. She would like to have a  FULL CODE STATUS.        Edilia BoKABAYE  Tezra Mahr, MD    Jackquline DenmarkKB / JRH  D:  11/06/2015   18:37  T:  11/06/2015   19:24  Job #:  [id number]

## 2015-11-07 LAB — GLUCOSE, POC
Glucose (POC): 92 mg/dL (ref 70–110)
Glucose (POC): 114 mg/dL — ABNORMAL HIGH (ref 70–110)
Glucose (POC): 112 mg/dL — ABNORMAL HIGH (ref 70–110)
Glucose (POC): 122 mg/dL — ABNORMAL HIGH (ref 70–110)

## 2015-11-07 LAB — HEMOGLOBIN A1C WITH EAG
Est. average glucose: 137 mg/dL
Hemoglobin A1c: 6.4 % — ABNORMAL HIGH (ref 4.2–5.6)

## 2015-11-07 MED ORDER — ENOXAPARIN 80 MG/0.8 ML SUB-Q SYRINGE
80 mg/0.8 mL | Freq: Two times a day (BID) | SUBCUTANEOUS | Status: DC
Start: 2015-11-07 — End: 2015-11-09
  Administered 2015-11-07 – 2015-11-09 (×4): via SUBCUTANEOUS

## 2015-11-07 MED ORDER — KETOROLAC TROMETHAMINE 30 MG/ML INJECTION
30 mg/mL (1 mL) | Freq: Four times a day (QID) | INTRAMUSCULAR | Status: DC | PRN
Start: 2015-11-07 — End: 2015-11-09

## 2015-11-07 MED ORDER — MORPHINE 2 MG/ML INJECTION
2 mg/mL | INTRAMUSCULAR | Status: DC | PRN
Start: 2015-11-07 — End: 2015-11-09
  Administered 2015-11-07 – 2015-11-09 (×8): via INTRAVENOUS

## 2015-11-07 MED FILL — OXYBUTYNIN CHLORIDE SR 5 MG 24 HR TAB: 5 mg | ORAL | Qty: 2

## 2015-11-07 MED FILL — FLUOXETINE 20 MG CAP: 20 mg | ORAL | Qty: 2

## 2015-11-07 MED FILL — ARNUITY ELLIPTA 100 MCG/ACTUATION POWDER FOR INHALATION: 100 mcg/actuation | RESPIRATORY_TRACT | Qty: 14

## 2015-11-07 MED FILL — MORPHINE 2 MG/ML INJECTION: 2 mg/mL | INTRAMUSCULAR | Qty: 1

## 2015-11-07 MED FILL — MONTELUKAST 10 MG TAB: 10 mg | ORAL | Qty: 1

## 2015-11-07 MED FILL — MIRTAZAPINE 15 MG TAB: 15 mg | ORAL | Qty: 2

## 2015-11-07 MED FILL — ALPRAZOLAM 0.5 MG TAB: 0.5 mg | ORAL | Qty: 1

## 2015-11-07 MED FILL — CALCITRATE 315 MG CALCIUM-6.25 MCG (250 UNIT) TABLET: 315 mg-6.25 mcg (250 unit) | ORAL | Qty: 1

## 2015-11-07 MED FILL — LYRICA 50 MG CAPSULE: 50 mg | ORAL | Qty: 1

## 2015-11-07 MED FILL — LORATADINE 10 MG TAB: 10 mg | ORAL | Qty: 1

## 2015-11-07 MED FILL — BUSPIRONE 5 MG TAB: 5 mg | ORAL | Qty: 3

## 2015-11-07 MED FILL — INSULIN LISPRO 100 UNIT/ML INJECTION: 100 unit/mL | SUBCUTANEOUS | Qty: 1

## 2015-11-07 MED FILL — PRAVASTATIN 20 MG TAB: 20 mg | ORAL | Qty: 2

## 2015-11-07 MED FILL — ENOXAPARIN 80 MG/0.8 ML SUB-Q SYRINGE: 80 mg/0.8 mL | SUBCUTANEOUS | Qty: 0.8

## 2015-11-07 MED FILL — AZELASTINE 137 MCG NASAL SPRAY AEROSOL: 137 mcg (0.1 %) | NASAL | Qty: 1

## 2015-11-07 MED FILL — ZOLPIDEM 5 MG TAB: 5 mg | ORAL | Qty: 2

## 2015-11-07 MED FILL — ENOXAPARIN 120 MG/0.8 ML SUB-Q SYRINGE: 120 mg/0.8 mL | SUBCUTANEOUS | Qty: 0.8

## 2015-11-07 MED FILL — FLUTICASONE 50 MCG/ACTUATION NASAL SPRAY, SUSP: 50 mcg/actuation | NASAL | Qty: 15.8

## 2015-11-07 NOTE — Procedures (Signed)
[facility]  *** FINAL REPORT ***    Name: Cynthia Marsh, Cynthia Marsh  MRN: [id number]  DOB: 09 Jul 1951  HIS Order #: [id number]  TRAKnet Visit #: [id number]  Date: 06 Nov 2015    TYPE OF TEST: Peripheral Venous Testing    REASON FOR TEST  Atypical chest pain, Pulmonary embolism    Right Leg:-  Deep venous thrombosis:           No  Superficial venous thrombosis:    No  Deep venous insufficiency:        Not examined  Superficial venous insufficiency: Not examined    Left Leg:-  Deep venous thrombosis:           No  Superficial venous thrombosis:    No  Deep venous insufficiency:        Not examined  Superficial venous insufficiency: Not examined      INTERPRETATION/FINDINGS  Duplex images were obtained using 2-D gray scale, color flow, and  spectral Doppler analysis.  Right leg :  1. Deep veins visualized include the common femoral, femoral,  popliteal, posterior tibial and peroneal veins.  2. No evidence of deep venous thrombosis detected in the veins  visualized.  3. Superficial veins visualized include the great saphenous vein.  4. No evidence of superficial thrombosis detected.  5. Normal multiphasic flow in the posterior tibial artery.  Left leg :  1. Deep veins visualized include the common femoral, femoral,  popliteal, posterior tibial and peroneal veins.  2. No evidence of deep venous thrombosis detected in the veins  visualized.  3. Superficial veins visualized include the great saphenous vein.  4. No evidence of superficial thrombosis detected.  5. Normal multiphasic flow in the posterior tibial artery.    ADDITIONAL COMMENTS    I have personally reviewed the data relevant to the interpretation of  this  study.    TECHNOLOGIST: Zack Sealobert Dolensky, RCIS, RVS  Signed: 11/06/2015 06:22 PM    PHYSICIAN: Liz BeachMarc A. Lorine Bearsamacho, MD  Signed: 11/07/2015 12:32 PM

## 2015-11-07 NOTE — Procedures (Signed)
[facility]  *** FINAL REPORT ***    Name: Bozarth, Genette  MRN: [id number]  DOB: 09 Jul 1951  HIS Order #: [id number]  TRAKnet Visit #: [id number]  Date: 06 Nov 2015    TYPE OF TEST: Peripheral Venous Testing    REASON FOR TEST  Atypical chest pain, Pulmonary embolism    Right Leg:-  Deep venous thrombosis:           No  Superficial venous thrombosis:    No  Deep venous insufficiency:        Not examined  Superficial venous insufficiency: Not examined    Left Leg:-  Deep venous thrombosis:           No  Superficial venous thrombosis:    No  Deep venous insufficiency:        Not examined  Superficial venous insufficiency: Not examined      INTERPRETATION/FINDINGS  Duplex images were obtained using 2-D gray scale, color flow, and  spectral Doppler analysis.  Right leg :  1. Deep veins visualized include the common femoral, femoral,  popliteal, posterior tibial and peroneal veins.  2. No evidence of deep venous thrombosis detected in the veins  visualized.  3. Superficial veins visualized include the great saphenous vein.  4. No evidence of superficial thrombosis detected.  5. Normal multiphasic flow in the posterior tibial artery.  Left leg :  1. Deep veins visualized include the common femoral, femoral,  popliteal, posterior tibial and peroneal veins.  2. No evidence of deep venous thrombosis detected in the veins  visualized.  3. Superficial veins visualized include the great saphenous vein.  4. No evidence of superficial thrombosis detected.  5. Normal multiphasic flow in the posterior tibial artery.    ADDITIONAL COMMENTS    I have personally reviewed the data relevant to the interpretation of  this  study.    TECHNOLOGIST: Robert Dolensky, RCIS, RVS  Signed: 11/06/2015 06:22 PM    PHYSICIAN: Olina Melfi A. Nadina Fomby, MD  Signed: 11/07/2015 12:32 PM

## 2015-11-07 NOTE — Progress Notes (Signed)
Patient meets inpatient criteria so changed to inpatient

## 2015-11-07 NOTE — Progress Notes (Signed)
Echocardiogram completed, report to follow.

## 2015-11-07 NOTE — Progress Notes (Signed)
[facility]  Progress Note    Patient: Cynthia Marsh Age: 64 y.o. DOB: 05/22/1951 MR#: [id number] SSN: [id number]  Date/Time: 11/07/2015 12:43 PM    Subjective/24-hour events:     No SOB, palpitations.  Does still complain of some pleuritic chest pain with movement.    Assessment:   Acute pulmonary embolism  DM 2  JHTN  Depression/anxiety    Plan:  Continue Lovenox for now.  Hoping that patient will be able to obtain xarelto as outpatient.  [facility]  If able to transition to Xarelto, anticipate discharge tomorrow if no acute issues develop in interim      Case discussed with:  Patient  Family  Nursing  Case Management  DVT Prophylaxis:  Lovenox  Hep SQ  SCDs  Coumadin   On Heparin gtt    Objective:   VS:   Visit Vitals   ??? BP 129/74 (BP 1 Location: Left arm, BP Patient Position: At rest)   ??? Pulse 88   ??? Temp 97 ??F (36.1 ??C)   ??? Resp 18   ??? Ht 5\' 4"  (1.626 m)   ??? Wt 76.4 kg (168 lb 6.4 oz)   ??? SpO2 100%   ??? BMI 28.91 kg/m2      Tmax/24hrs: Temp (24hrs), Avg:97.6 ??F (36.4 ??C), Min:97 ??F (36.1 ??C), Max:98.1 ??F (36.7 ??C)    Intake/Output Summary (Last 24 hours) at 11/07/15 1243  Last data filed at 11/07/15 0923   Gross per 24 hour   Intake    240 ml   Output    200 ml   Net     40 ml       General:  In NAD.  Cardiovascular: RRR.   Pulmonary:  Clear, no wheezes.  Effort nonlabored.  GI:  Abdomen soft, NTTP.  Extremities:  Warm, no ischemia.  Neuro:  Awake and alert, motor nonfocal.    Labs:    Recent Results (from the past 24 hour(s))   CARDIAC PANEL,(CK, CKMB & TROPONIN)    Collection Time: 11/06/15  2:24 PM   Result Value Ref Range    CK 39 26 - 192 U/L    CK - MB 0.6 0.5 - 3.6 ng/ml    CK-MB Index 1.5 0.0 - 4.0 %    Troponin-I, Qt. <0.02 0.0 - 0.045 NG/ML   PTT    Collection Time: 11/06/15  2:24 PM   Result Value Ref Range    aPTT 25.4 23.0 - 36.4 SEC   HEMOGLOBIN A1C WITH EAG    Collection Time: 11/06/15  2:24 PM   Result Value Ref Range     Hemoglobin A1c 6.4 (H) 4.2 - 5.6 %    Est. average glucose 137 mg/dL   EKG, 12 LEAD, SUBSEQUENT    Collection Time: 11/06/15  2:53 PM   Result Value Ref Range    Ventricular Rate 89 BPM    Atrial Rate 89 BPM    P-R Interval 150 ms    QRS Duration 66 ms    Q-T Interval 418 ms    QTC Calculation (Bezet) 508 ms    Calculated P Axis 59 degrees    Calculated R Axis 2 degrees    Calculated T Axis 16 degrees    Diagnosis       Normal sinus rhythm  Left atrial enlargement  Nonspecific ST and T wave abnormality  Prolonged QT  Abnormal ECG  When compared with ECG of  06-Nov-2015 09:56,  No significant change was found  Confirmed by Roney Marion ([id number]) on 11/06/2015 4:37:02 PM     GLUCOSE, POC    Collection Time: 11/06/15  5:42 PM   Result Value Ref Range    Glucose (POC) 80 70 - 110 mg/dL   GLUCOSE, POC    Collection Time: 11/06/15 11:01 PM   Result Value Ref Range    Glucose (POC) 92 70 - 110 mg/dL   GLUCOSE, POC    Collection Time: 11/07/15  8:07 AM   Result Value Ref Range    Glucose (POC) 114 (H) 70 - 110 mg/dL   GLUCOSE, POC    Collection Time: 11/07/15 11:39 AM   Result Value Ref Range    Glucose (POC) 112 (H) 70 - 110 mg/dL       Signed By: Areatha Keas, MD     November 07, 2015 12:43 PM

## 2015-11-07 NOTE — Other (Signed)
Bedside and Verbal shift change report given to Harvin Hazel RN (oncoming nurse) by Orlie Dakin, RN (offgoing nurse). Report included the following information SBAR, Kardex, MAR and Recent Results.    SITUATION:   ? Code Status: Full Code  ? Reason for Admission: Acute pulmonary embolism (HCC)  ? Atypical chest pain  ? Atypical chest pain  ? Pulmonary emboli (HCC)    ? Hospital day: 0  ? Problem List:       Hospital Problems  Date Reviewed: Nov 30, 2015          Codes Class Noted POA    Pulmonary emboli [facility]) ICD-10-CM: I26.99  ICD-9-CM: 415.19  11/07/2015 Unknown        Atypical chest pain ICD-10-CM: R07.89  ICD-9-CM: 786.59  November 30, 2015 Unknown        Acute pulmonary embolism (HCC) ICD-10-CM: I26.99  ICD-9-CM: 415.19  11/30/15 Unknown              BACKGROUND:    Past Medical History:   Past Medical History   Diagnosis Date   ??? Anxiety    ??? Asthma    ??? Chronic kidney disease    ??? Depression    ??? Diabetes (HCC)    ??? Hypertension    ??? Insomnia    ??? Osteoporosis          Patient taking anticoagulants yes     ASSESSMENT:   ? Changes in Assessment Throughout Shift: none    ? Patient has Central Line: no Reasons if yes: n/a  ? Patient has Foley Cath: no Reasons if yes: n/a     ? Last Vitals:     Vitals:    11/07/15 1514   BP: 115/79   Pulse: 85   Resp: 18   Temp: 97.4 ??F (36.3 ??C)   SpO2:    Weight:    Height:        ? IV and DRAINS (will only show if present)   Peripheral IV 2015/11/30 Left Antecubital-Site Assessment: Clean, dry, & intact    ? WOUND (if present)   Wound Type:  none   Dressing present Dressing Present : No   Wound Concerns/Notes:  none    ? PAIN    Pain Assessment    Pain Intensity 1: 4 (11/07/15 1600)    Pain Location 1: Chest    Pain Intervention(s) 1: Medication (see MAR)    Patient Stated Pain Goal: 0  o Interventions for Pain:  none  o Intervention effective: no  o Time of last intervention: n/a   o Reassessment Completed: no     ? Last 3 Weights:  Last 3 Recorded Weights in this Encounter     11-30-2015 0949 11/07/15 0430   Weight: 78 kg (172 lb) 76.4 kg (168 lb 6.4 oz)     Weight change:     ? INTAKE/OUPUT    Current Shift: 12/03 0701 - 12/03 1900  In: 390 [P.O.:390]  Out: 302 [Urine:302]    Last three shifts:      ? LAB RESULTS     Recent Labs      11/30/15   1032   WBC  5.1   HGB  13.0   HCT  40.1   PLT  305        Recent Labs      Nov 30, 2015   1032   NA  140   K  3.9   GLU  107*   BUN  20*  CREA  0.78   CA  10.1   MG  1.8       RECOMMENDATIONS AND DISCHARGE PLANNING     1. Pending tests/procedures/ Plan of Care or Other Needs: none     2. Discharge plan for patient and Needs/Barriers: home vs snf    3. Estimated Discharge Date: 2-3 days Posted on Whiteboard in Patient???s Room: yes      4. The patient's care plan was reviewed with the oncoming nurse.       "HEALS" SAFETY CHECK      Fall Risk    Total Score: 3    Safety Measures: Safety Measures: Bed/Chair alarm on, Bed/Chair-Wheels locked, Bed in low position, Call light within reach, Side rails X 3    A safety check occurred in the patient's room between off going nurse and oncoming nurse listed above.    The safety check included the below items  Area Items   H  High Alert Medications ? Verify all high alert medication drips (heparin, PCA, etc.)   E  Equipment ? Suction is set up for ALL patients (with yanker)  ? Red plugs utilized for all equipment (IV pumps, etc.)  ? WOW???s wiped down at end of shift.  ? Room stocked with oxygen, suction, and other unit-specific supplies   A  Alarms ? Bed alarm is set for fall risk patients  ? Ensure chair alarm is in place and activated if patient is up in a chair   L  Lines ? Check IV for any infiltration  ? Foley bag is empty if patient has a Foley   ? Tubing and IV bags are labeled   S  Safety   ? Room is clean, patient is clean, and equipment is clean.  ? Hallways are clear from equipment besides carts.   ? Fall bracelet on for fall risk patients  ? Ensure room is clear and free of clutter   ? Suction is set up for ALL patients (with yanker)  ? Hallways are clear from equipment besides carts.   ? Isolation precautions followed, supplies available outside room, sign posted     Orlie DakinBrittney N Keitra Carusone, RN

## 2015-11-08 LAB — GLUCOSE, POC
Glucose (POC): 135 mg/dL — ABNORMAL HIGH (ref 70–110)
Glucose (POC): 231 mg/dL — ABNORMAL HIGH (ref 70–110)
Glucose (POC): 157 mg/dL — ABNORMAL HIGH (ref 70–110)
Glucose (POC): 108 mg/dL (ref 70–110)

## 2015-11-08 MED ORDER — AMLODIPINE 10 MG TAB
10 mg | Freq: Every day | ORAL | Status: DC
Start: 2015-11-08 — End: 2015-11-09
  Administered 2015-11-08 – 2015-11-09 (×2): via ORAL

## 2015-11-08 MED ORDER — RAMIPRIL 5 MG CAP
5 mg | Freq: Every day | ORAL | Status: DC
Start: 2015-11-08 — End: 2015-11-08

## 2015-11-08 MED ORDER — AMLODIPINE 10 MG TAB
10 mg | Freq: Every day | ORAL | Status: DC
Start: 2015-11-08 — End: 2015-11-08

## 2015-11-08 MED ORDER — RAMIPRIL 5 MG CAP
5 mg | Freq: Every day | ORAL | Status: DC
Start: 2015-11-08 — End: 2015-11-09
  Administered 2015-11-08 – 2015-11-09 (×2): via ORAL

## 2015-11-08 MED FILL — MORPHINE 2 MG/ML INJECTION: 2 mg/mL | INTRAMUSCULAR | Qty: 1

## 2015-11-08 MED FILL — BUSPIRONE 5 MG TAB: 5 mg | ORAL | Qty: 3

## 2015-11-08 MED FILL — LORATADINE 10 MG TAB: 10 mg | ORAL | Qty: 1

## 2015-11-08 MED FILL — AMLODIPINE 10 MG TAB: 10 mg | ORAL | Qty: 1

## 2015-11-08 MED FILL — FLUOXETINE 20 MG CAP: 20 mg | ORAL | Qty: 2

## 2015-11-08 MED FILL — CALCITRATE 315 MG CALCIUM-6.25 MCG (250 UNIT) TABLET: 315 mg-6.25 mcg (250 unit) | ORAL | Qty: 1

## 2015-11-08 MED FILL — OXYBUTYNIN CHLORIDE SR 5 MG 24 HR TAB: 5 mg | ORAL | Qty: 2

## 2015-11-08 MED FILL — ZOLPIDEM 5 MG TAB: 5 mg | ORAL | Qty: 2

## 2015-11-08 MED FILL — RAMIPRIL 5 MG CAP: 5 mg | ORAL | Qty: 1

## 2015-11-08 MED FILL — ALPRAZOLAM 0.5 MG TAB: 0.5 mg | ORAL | Qty: 1

## 2015-11-08 MED FILL — MONTELUKAST 10 MG TAB: 10 mg | ORAL | Qty: 1

## 2015-11-08 MED FILL — ENOXAPARIN 80 MG/0.8 ML SUB-Q SYRINGE: 80 mg/0.8 mL | SUBCUTANEOUS | Qty: 0.8

## 2015-11-08 MED FILL — LYRICA 50 MG CAPSULE: 50 mg | ORAL | Qty: 1

## 2015-11-08 MED FILL — OXYCODONE-ACETAMINOPHEN 10 MG-325 MG TAB: 10-325 mg | ORAL | Qty: 1

## 2015-11-08 MED FILL — MIRTAZAPINE 15 MG TAB: 15 mg | ORAL | Qty: 2

## 2015-11-08 MED FILL — PRAVASTATIN 20 MG TAB: 20 mg | ORAL | Qty: 2

## 2015-11-08 NOTE — Other (Signed)
Bedside and Verbal shift change report given to Chauntee RN (oncoming nurse) by Orlie Dakin, RN (offgoing nurse). Report included the following information SBAR, Kardex, MAR and Recent Results.    SITUATION:   ? Code Status: Full Code  Reason for Admission: Acute pulmonary embolism (HCC)  Atypical chest pain  Atypical chest pain  ? Pulmonary emboli (HCC)    ? Hospital day: 1  ? Problem List:       Hospital Problems  Date Reviewed: Dec 05, 2015          Codes Class Noted POA    Pulmonary emboli [facility]) ICD-10-CM: I26.99  ICD-9-CM: 415.19  11/07/2015 Unknown        Atypical chest pain ICD-10-CM: R07.89  ICD-9-CM: 786.59  05-Dec-2015 Unknown        Acute pulmonary embolism (HCC) ICD-10-CM: I26.99  ICD-9-CM: 415.19  December 05, 2015 Unknown              BACKGROUND:    Past Medical History:   Past Medical History   Diagnosis Date   ??? Anxiety    ??? Asthma    ??? Chronic kidney disease    ??? Depression    ??? Diabetes (HCC)    ??? Hypertension    ??? Insomnia    ??? Osteoporosis          Patient taking anticoagulants yes     ASSESSMENT:   ? Changes in Assessment Throughout Shift: none    ? Patient has Central Line: no Reasons if yes: n/a  ? Patient has Foley Cath: no Reasons if yes: n/a     ? Last Vitals:     Vitals:    11/08/15 1524   BP: 118/83   Pulse: 85   Resp: 20   Temp: 98.2 ??F (36.8 ??C)   SpO2: 95%   Weight:    Height:        ? IV and DRAINS (will only show if present)   Peripheral IV 2015/12/05 Left Antecubital-Site Assessment: Clean, dry, & intact    ? WOUND (if present)   Wound Type:  none   Dressing present Dressing Present : No   Wound Concerns/Notes:  none    ? PAIN    Pain Assessment    Pain Intensity 1: 9 (11/08/15 1731)    Pain Location 1: Chest    Pain Intervention(s) 1: Medication (see MAR)    Patient Stated Pain Goal: 0  o Interventions for Pain:  none  o Intervention effective: no  o Time of last intervention: n/a   o Reassessment Completed: no     ? Last 3 Weights:  Last 3 Recorded Weights in this Encounter     2015-12-05 0949 11/07/15 0430 11/08/15 0707   Weight: 78 kg (172 lb) 76.4 kg (168 lb 6.4 oz) 75 kg (165 lb 4.8 oz)     Weight change:     ? INTAKE/OUPUT    Current Shift: 12/04 0701 - 12/04 1900  In: 720 [P.O.:720]  Out: 900 [Urine:900]    Last three shifts: 12-05-2023 1901 - 12/04 0700  In: 390 [P.O.:390]  Out: 652 [Urine:652]    ? LAB RESULTS     Recent Labs      2015-12-05   1032   WBC  5.1   HGB  13.0   HCT  40.1   PLT  305        Recent Labs      Dec 05, 2015   1032   NA  140  K  3.9   GLU  107*   BUN  20*   CREA  0.78   CA  10.1   MG  1.8       RECOMMENDATIONS AND DISCHARGE PLANNING     1. Pending tests/procedures/ Plan of Care or Other Needs: none     2. Discharge plan for patient and Needs/Barriers: home vs snf    3. Estimated Discharge Date: 2-3 days Posted on Whiteboard in Patient???s Room: yes      4. The patient's care plan was reviewed with the oncoming nurse.       "HEALS" SAFETY CHECK      Fall Risk    Total Score: 3    Safety Measures: Safety Measures: Bed/Chair alarm on, Bed/Chair-Wheels locked, Bed in low position, Call light within reach, Side rails X 3    A safety check occurred in the patient's room between off going nurse and oncoming nurse listed above.    The safety check included the below items  Area Items   H  High Alert Medications ? Verify all high alert medication drips (heparin, PCA, etc.)   E  Equipment ? Suction is set up for ALL patients (with yanker)  ? Red plugs utilized for all equipment (IV pumps, etc.)  ? WOW???s wiped down at end of shift.  ? Room stocked with oxygen, suction, and other unit-specific supplies   A  Alarms ? Bed alarm is set for fall risk patients  ? Ensure chair alarm is in place and activated if patient is up in a chair   L  Lines ? Check IV for any infiltration  ? Foley bag is empty if patient has a Foley   ? Tubing and IV bags are labeled   S  Safety   ? Room is clean, patient is clean, and equipment is clean.  ? Hallways are clear from equipment besides carts.    ? Fall bracelet on for fall risk patients  ? Ensure room is clear and free of clutter  ? Suction is set up for ALL patients (with yanker)  ? Hallways are clear from equipment besides carts.   ? Isolation precautions followed, supplies available outside room, sign posted     Orlie DakinBrittney N Godfrey Tritschler, RN

## 2015-11-08 NOTE — Other (Signed)
MD Lindie SpruceWyatt notified that the pt stated she takes norvasc 10mg  daily and ramipril 5mg  daily. MD stated he will put the orders in.

## 2015-11-08 NOTE — Progress Notes (Signed)
Chaplain conducted an initial consultation and Spiritual Assessment for Cynthia Marsh, who is a 64 y.o.,female. Patient???s Primary Language is: [place].   According to the patient???s EMR Religious Affiliation is: [employer].     The reason the Patient came to the hospital is:   Patient Active Problem List    Diagnosis Date Noted   ??? Pulmonary emboli (HCC) 11/07/2015   ??? Atypical chest pain 11/06/2015   ??? Acute pulmonary embolism (HCC) 11/06/2015   ??? Hx of spinal fusion 08/17/2015   ??? Intractable migraine without aura and without status migrainosus 08/06/2015   ??? Mild intermittent asthma without complication 04/20/2015   ??? Chronic pain of right knee 04/20/2015   ??? Chronic bilateral low back pain without sciatica 04/20/2015   ??? Type 2 diabetes mellitus without complication (HCC) 04/20/2015   ??? Essential hypertension with goal blood pressure less than 140/90 04/20/2015   ??? Depression    ??? Anxiety    ??? Insomnia         The Chaplain provided the following Interventions:  Initiated a relationship of care and support.   Listened empathically.  Provided chaplaincy education.  Provided information about Spiritual Care Services.  Offered prayer and assurance of continued prayers on patient's behalf.     The following outcomes were achieved:  Patient shared limited information about both their medical narrative and spiritual journey/beliefs.  Patient processed feeling about current hospitalization.  Patient expressed gratitude for the Chaplain's visit.    Assessment:  Patient did not mention any religious/cultural needs that will affect patient???s preferences in health care.  Patient did not indicate any spiritual or religious issues which require Spiritual Care Services interventions at this time.     Plan:  Chaplains will continue to follow and will provide pastoral care on an as needed/requested basis.  Chaplain recommends bedside caregivers page chaplain on duty if patient shows signs of acute spiritual or emotional distress.     Delsa Granaharles H. Blondell RevealWinslow, M.Div  [employer] manag[employer] Chaplain  Spiritual Care Dept.  [phone]

## 2015-11-08 NOTE — Progress Notes (Signed)
[facility]  Progress Note    Patient: Cynthia Marsh Age: 64 y.o. DOB: 03/28/1951 MR#: [id number] SSN: [id number]  Date/Time: 11/08/2015 1:29 PM    Subjective/24-hour events:     Complains of having some L leg pain overnight, but o/w nothing new/acute.  No new chest pain or SOB.    Assessment:   Acute pulmonary embolism  DM 2  JHTN  Depression/anxiety    Plan:  Continue Lovenox for now.  Called patient's outpatient pharmacy and prior auth will be required to obtain xarelto at necessary dose that she will need at discharge.  [facility] with hope that discount card will be able to be provided.  Resumed home antihypertensive therapy as previously prescribed.  Home tomorrow if xarelto able to be obtained.    Case discussed with:  Patient  Family  Nursing  Case Management  DVT Prophylaxis:  Lovenox  Hep SQ  SCDs  Coumadin   On Heparin gtt    Objective:   VS:   Visit Vitals   ??? BP 105/74 (BP 1 Location: Left arm, BP Patient Position: Sitting)   ??? Pulse 91   ??? Temp 97.9 ??F (36.6 ??C)   ??? Resp 20   ??? Ht 5\' 4"  (1.626 m)   ??? Wt 75 kg (165 lb 4.8 oz)   ??? SpO2 97%   ??? BMI 28.37 kg/m2      Tmax/24hrs: Temp (24hrs), Avg:97.6 ??F (36.4 ??C), Min:97.4 ??F (36.3 ??C), Max:98 ??F (36.7 ??C)      Intake/Output Summary (Last 24 hours) at 11/08/15 1329  Last data filed at 11/08/15 1109   Gross per 24 hour   Intake    150 ml   Output   1352 ml   Net  -1202 ml       General:  In NAD.  Cardiovascular: RRR.   Pulmonary:  Clear, no wheezes.  Effort nonlabored.  GI:  Abdomen soft, NTTP.  Extremities:  Warm, no ischemia.  Neuro:  Awake and alert, motor nonfocal.    Labs:    Recent Results (from the past 24 hour(s))   GLUCOSE, POC    Collection Time: 11/07/15  4:45 PM   Result Value Ref Range    Glucose (POC) 122 (H) 70 - 110 mg/dL   GLUCOSE, POC    Collection Time: 11/07/15 10:55 PM   Result Value Ref Range    Glucose (POC) 135 (H) 70 - 110 mg/dL   GLUCOSE, POC    Collection Time: 11/08/15 10:01 AM    Result Value Ref Range    Glucose (POC) 231 (H) 70 - 110 mg/dL   GLUCOSE, POC    Collection Time: 11/08/15 11:24 AM   Result Value Ref Range    Glucose (POC) 157 (H) 70 - 110 mg/dL       Signed By: Areatha KeasArmando J Laree Garron, MD     November 08, 2015 1:29 PM

## 2015-11-08 NOTE — Other (Signed)
MD Lindie SpruceWyatt gave orders for norvasc and altace to be given today.

## 2015-11-08 NOTE — Other (Signed)
Bedside and Verbal shift change report given to B Herbalist (Cabin crew) by Harvin Hazel, RN (offgoing nurse). Report included the following information SBAR, Kardex, MAR and Recent Results.    SITUATION:   ? Code Status: Full Code  Reason for Admission: Acute pulmonary embolism (HCC)  Atypical chest pain  Atypical chest pain  ? Pulmonary emboli (HCC)    ? Hospital day: 1  ? Problem List:       Hospital Problems  Date Reviewed: Nov 13, 2015          Codes Class Noted POA    Pulmonary emboli [facility]) ICD-10-CM: I26.99  ICD-9-CM: 415.19  11/07/2015 Unknown        Atypical chest pain ICD-10-CM: R07.89  ICD-9-CM: 786.59  2015-11-13 Unknown        Acute pulmonary embolism (HCC) ICD-10-CM: I26.99  ICD-9-CM: 415.19  11-13-2015 Unknown              BACKGROUND:    Past Medical History:   Past Medical History   Diagnosis Date   ??? Anxiety    ??? Asthma    ??? Chronic kidney disease    ??? Depression    ??? Diabetes (HCC)    ??? Hypertension    ??? Insomnia    ??? Osteoporosis          Patient taking anticoagulants yes     ASSESSMENT:   ? Changes in Assessment Throughout Shift: none    ? Patient has Central Line: no Reasons if yes: n/a  ? Patient has Foley Cath: no Reasons if yes: n/a     ? Last Vitals:     Vitals:    11/08/15 0420   BP: 127/90   Pulse: 82   Resp: 20   Temp: 97.4 ??F (36.3 ??C)   SpO2: 95%   Weight:    Height:        ? IV and DRAINS (will only show if present)   Peripheral IV 11/13/15 Left Antecubital-Site Assessment: Clean, dry, & intact    ? WOUND (if present)   Wound Type:  none   Dressing present Dressing Present : No   Wound Concerns/Notes:  none    ? PAIN    Pain Assessment    Pain Intensity 1: 0 (11/08/15 0707)    Pain Location 1: Chest    Pain Intervention(s) 1: Medication (see MAR)    Patient Stated Pain Goal: 0  o Interventions for Pain:  none  o Intervention effective: no  o Time of last intervention: n/a   o Reassessment Completed: no     ? Last 3 Weights:  Last 3 Recorded Weights in this Encounter     2015/11/13 0949 11/07/15 0430   Weight: 78 kg (172 lb) 76.4 kg (168 lb 6.4 oz)     Weight change:     ? INTAKE/OUPUT    Current Shift:      Last three shifts: 11-13-2023 1901 - 12/04 0700  In: 390 [P.O.:390]  Out: 652 [Urine:652]    ? LAB RESULTS     Recent Labs      11-13-2015   1032   WBC  5.1   HGB  13.0   HCT  40.1   PLT  305        Recent Labs      2015-11-13   1032   NA  140   K  3.9   GLU  107*   BUN  20*   CREA  0.78  CA  10.1   MG  1.8       RECOMMENDATIONS AND DISCHARGE PLANNING     1. Pending tests/procedures/ Plan of Care or Other Needs: none     2. Discharge plan for patient and Needs/Barriers: home vs snf    3. Estimated Discharge Date: 2-3 days Posted on Whiteboard in Patient???s Room: yes      4. The patient's care plan was reviewed with the oncoming nurse.       "HEALS" SAFETY CHECK      Fall Risk    Total Score: 3    Safety Measures: Safety Measures: Bed/Chair alarm on, Bed/Chair-Wheels locked, Bed in low position, Call light within reach, Side rails X 3    A safety check occurred in the patient's room between off going nurse and oncoming nurse listed above.    The safety check included the below items  Area Items   H  High Alert Medications ? Verify all high alert medication drips (heparin, PCA, etc.)   E  Equipment ? Suction is set up for ALL patients (with yanker)  ? Red plugs utilized for all equipment (IV pumps, etc.)  ? WOW???s wiped down at end of shift.  ? Room stocked with oxygen, suction, and other unit-specific supplies   A  Alarms ? Bed alarm is set for fall risk patients  ? Ensure chair alarm is in place and activated if patient is up in a chair   L  Lines ? Check IV for any infiltration  ? Foley bag is empty if patient has a Foley   ? Tubing and IV bags are labeled   S  Safety   ? Room is clean, patient is clean, and equipment is clean.  ? Hallways are clear from equipment besides carts.   ? Fall bracelet on for fall risk patients  ? Ensure room is clear and free of clutter   ? Suction is set up for ALL patients (with yanker)  ? Hallways are clear from equipment besides carts.   ? Isolation precautions followed, supplies available outside room, sign posted     Harvin Hazelamatha Scott, RN

## 2015-11-09 LAB — GLUCOSE, POC
Glucose (POC): 129 mg/dL — ABNORMAL HIGH (ref 70–110)
Glucose (POC): 197 mg/dL — ABNORMAL HIGH (ref 70–110)
Glucose (POC): 152 mg/dL — ABNORMAL HIGH (ref 70–110)

## 2015-11-09 MED ORDER — HYDROMORPHONE 2 MG TAB
2 mg | ORAL_TABLET | ORAL | 0 refills | Status: DC | PRN
Start: 2015-11-09 — End: 2015-12-10

## 2015-11-09 MED ORDER — RIVAROXABAN 15 MG (42)-20 MG (9) TABLETS IN A DOSE PACK
15 mg (42)- 20 mg (9) | ORAL | 0 refills | Status: DC
Start: 2015-11-09 — End: 2015-12-01

## 2015-11-09 MED FILL — RAMIPRIL 5 MG CAP: 5 mg | ORAL | Qty: 1

## 2015-11-09 MED FILL — LORATADINE 10 MG TAB: 10 mg | ORAL | Qty: 1

## 2015-11-09 MED FILL — LYRICA 50 MG CAPSULE: 50 mg | ORAL | Qty: 1

## 2015-11-09 MED FILL — MORPHINE 2 MG/ML INJECTION: 2 mg/mL | INTRAMUSCULAR | Qty: 1

## 2015-11-09 MED FILL — FLUOXETINE 20 MG CAP: 20 mg | ORAL | Qty: 2

## 2015-11-09 MED FILL — ALPRAZOLAM 0.5 MG TAB: 0.5 mg | ORAL | Qty: 1

## 2015-11-09 MED FILL — OXYCODONE-ACETAMINOPHEN 10 MG-325 MG TAB: 10-325 mg | ORAL | Qty: 1

## 2015-11-09 MED FILL — CALCITRATE 315 MG CALCIUM-6.25 MCG (250 UNIT) TABLET: 315 mg-6.25 mcg (250 unit) | ORAL | Qty: 1

## 2015-11-09 MED FILL — OXYBUTYNIN CHLORIDE SR 5 MG 24 HR TAB: 5 mg | ORAL | Qty: 2

## 2015-11-09 MED FILL — BUSPIRONE 5 MG TAB: 5 mg | ORAL | Qty: 3

## 2015-11-09 MED FILL — PRAVASTATIN 20 MG TAB: 20 mg | ORAL | Qty: 2

## 2015-11-09 MED FILL — MIRTAZAPINE 15 MG TAB: 15 mg | ORAL | Qty: 2

## 2015-11-09 MED FILL — ENOXAPARIN 80 MG/0.8 ML SUB-Q SYRINGE: 80 mg/0.8 mL | SUBCUTANEOUS | Qty: 0.8

## 2015-11-09 MED FILL — INSULIN LISPRO 100 UNIT/ML INJECTION: 100 unit/mL | SUBCUTANEOUS | Qty: 1

## 2015-11-09 MED FILL — AMLODIPINE 10 MG TAB: 10 mg | ORAL | Qty: 1

## 2015-11-09 MED FILL — ZOLPIDEM 5 MG TAB: 5 mg | ORAL | Qty: 2

## 2015-11-09 MED FILL — MONTELUKAST 10 MG TAB: 10 mg | ORAL | Qty: 1

## 2015-11-09 NOTE — Other (Signed)
IDR/SLIDR Summary          Patient: Cynthia LevanFrankie Marsh MRN: [id number]    Age: 64 y.o.     Birthdate: 09/21/1951 Room/Bed: 205/01   Admit Diagnosis: Acute pulmonary embolism (HCC)  Atypical chest pain  Atypical chest pain  Pulmonary emboli (HCC)  Principal Diagnosis: <principal problem not specified>     Goals: Anticoagulate to treat PE  Readmission: NO  Quality Measure: Not applicable  VTE Prophylaxis: Chemical  Influenza Vaccine screening completed? YES  Pneumococcal Vaccine screening completed? YES  Mobility needs: No   Nutrition plan:No  Consults:  Financial concerns:No  Escalated to CM? YES  RRAT Score: 19   Interventions:Home Health  Testing due for pt today? NO  LOS: 2 days Expected length of stay 2 days  Discharge plan: home   PCP: Marvia PicklesNoel L Hunte, MD  Transportation needs: No    Days before discharge:one day until discharge   Discharge disposition: Home    Signed:     Anselm PancoastChauntee K Dancy, RN  11/09/2015  12:28 AM

## 2015-11-09 NOTE — Progress Notes (Signed)
Cynthia Marsh  Progress Note    Patient: Cynthia LevanFrankie Marsh Age: 64 y.o. DOB: 10/16/1951 MR#: [id number] SSN: [id number]  Date/Time: 11/09/2015 11:51 AM    Subjective/24-hour events:     Complains of having some L leg pain overnight, but o/w nothing new/acute.  No new chest pain or SOB.    Assessment:   Acute pulmonary embolism  DM 2  JHTN  Depression/anxiety    Plan:  D/C lovenox and initiate xarelto.  Home today once medication obtained.    Case discussed with:  Patient  Family  Nursing  Case Management  DVT Prophylaxis:  Lovenox  Hep SQ  SCDs  Coumadin   On Heparin gtt    Objective:   VS:   Visit Vitals   ??? BP 110/75 (BP 1 Location: Left arm, BP Patient Position: At rest)   ??? Pulse 88   ??? Temp 97.5 ??F (36.4 ??C)   ??? Resp 20   ??? Ht 5\' 4"  (1.626 m)   ??? Wt 78.7 kg (173 lb 9.6 oz)   ??? SpO2 95%   ??? BMI 29.8 kg/m2      Tmax/24hrs: Temp (24hrs), Avg:97.7 ??F (36.5 ??C), Min:97.1 ??F (36.2 ??C), Max:98.2 ??F (36.8 ??C)      Intake/Output Summary (Last 24 hours) at 11/09/15 1151  Last data filed at 11/09/15 1124   Gross per 24 hour   Intake   1920 ml   Output    800 ml   Net   1120 ml       General:  In NAD.  Cardiovascular: RRR.   Pulmonary:  Clear, no wheezes.  Effort nonlabored.  GI:  Abdomen soft, NTTP.  Extremities:  Warm, no ischemia.  Neuro:  Awake and alert, motor nonfocal.    Labs:    Recent Results (from the past 24 hour(s))   GLUCOSE, POC    Collection Time: 11/08/15  5:00 PM   Result Value Ref Range    Glucose (POC) 108 70 - 110 mg/dL   GLUCOSE, POC    Collection Time: 11/08/15  8:37 PM   Result Value Ref Range    Glucose (POC) 129 (H) 70 - 110 mg/dL   GLUCOSE, POC    Collection Time: 11/09/15  9:32 AM   Result Value Ref Range    Glucose (POC) 197 (H) 70 - 110 mg/dL   GLUCOSE, POC    Collection Time: 11/09/15 11:41 AM   Result Value Ref Range    Glucose (POC) 152 (H) 70 - 110 mg/dL       Signed By: Areatha KeasArmando J Tattianna Schnarr, MD     November 09, 2015 11:51 AM

## 2015-11-09 NOTE — Other (Signed)
GLYCEMIC CONTROL PLAN OF CARE    Assessment:  Pt is 64 yr old female admitted on 11/06/15 with chest pain. Pt with past medical history significant for T2DM, HTN, renal stones, anxiety disorder, depression.    Recommendations:  Continue correctional lispro ACHS. Monitor for need to advance to very insulin resistant scale lispro per protocol.     Will monitor inpatient for intervention.    Most recent blood glucose values:  Results for Rosalita LevanJACKSON, Cassi (MRN [id number]) as of 11/09/2015 14:16   Ref. Range 11/08/2015 17:00 11/08/2015 20:37 11/09/2015 09:32 11/09/2015 11:41   GLUCOSE,FAST - POC Latest Ref Range: 70 - 110 mg/dL 130108 865129 (H) 784197 (H) 696152 (H)     Current A1C:  6.4% (11/06/15) equivalent  to ave Blood Glucose of 137mg /dl for 2-3 months prior to admission    Current hospital diabetes medications:   correctional lispro ACHS- normal insulin sensitivity scale    Previous day Insulin TDD:  12/04: 2 units correctional lispro    Diet:   Diabetic consistent carbohydrate with glucerna once daily  Meal Intake:  Patient Vitals for the past 100 hrs:   % Diet Eaten   11/09/15 0936 50 %   11/08/15 1822 100 %   11/07/15 1339 100 %   11/07/15 0923 40 %     Home diabetes medications:  1000 mg metformin two times daily    Goals:  Pt BG will be within target range by _12/08/16____    Education:  ___  Refer to Diabetes Education Record             _x__  Education not indicated at this time    Francis GainesAlison Helm, MS, RD, CDE

## 2015-11-09 NOTE — Discharge Summary (Signed)
Discharge Summary    Patient: Cynthia Marsh MRN: [id number]  CSN: [id number]    Date of Birth: May 04, 1951  Age: 64 y.o.  Sex: female    DOA: 11/06/2015 LOS:  LOS: 2 days   Discharge Date: 11/09/2015     Admission Diagnoses:  Chest pain  Acute pulmonary embolism    Discharge Diagnoses:    Acute pulmonary embolism  DM 2  JHTN  Depression/anxiety    Discharge Condition: Stable    PHYSICAL EXAM  Visit Vitals   ??? BP 110/75 (BP 1 Location: Left arm, BP Patient Position: At rest)   ??? Pulse 88   ??? Temp 97.5 ??F (36.4 ??C)   ??? Resp 20   ??? Ht  (1.626 m)   ??? Wt 78.7 kg (173 lb 9.6 oz)   ??? SpO2 95%   ??? BMI 29.8 kg/m2       General: In NAD.  HEENT: NC, Atraumatic.  Anicteric sclerae.  Lungs:  CTA Bilaterally. No Wheezing/Rhonchi/Rales.  Heart:  RRR.  Abdomen: Soft, Non distended, Non tender.   Extremities: Warm, no ischemia.  Psych:???? Mood normal, not anxious or agitated.  Neurologic:?? Alert/appropriate, motor nonfocal.      Hospital Course:   See admission H&P for full details of HPI.  Patient admitted to telemetry bed and started on Lovenox for anticoagulation.  She continued to have some pleuritic chest pain, but did not have any hypoxia or other significant symptoms.  Xarelto was initiated and patient was provided with a month's supply of medication prior to discharge.  Her outpatient pharmacy was contacted by me and I was told that the medication would be covered by her insurance once the once-daily dosing kicked in.  Patient has remained medically stable and is stable for discharge home with outpateint follow up as advised.    Consults:   None    Significant Diagnostic Studies:   2D echo:  SUMMARY:  Left ventricle: Systolic function was normal by visual assessment.  Ejection fraction was estimated to be 60 %. There were no regional wall  motion abnormalities. Doppler parameters were consistent with abnormal  left ventricular relaxation (grade 1 diastolic dysfunction).     Right ventricle: Systolic pressure was within the normal range. Estimated  peak pressure was 30 mmHg.      CXR:  IMPRESSION:  ??  Atherosclerosis.      CTA chest:  IMPRESSION:  ??  Positive PE left lower lobe.      Discharge Medications:     Discharge Medication List as of 11/09/2015  4:12 PM      START taking these medications    Details   rivaroxaban (XARELTO) 15 mg (42)- 20 mg (9) DsPk Take as directed.  Take with food, at approximately the same time each day., Print, Disp-1 Dose Pack, R-0      HYDROmorphone (DILAUDID) 2 mg tablet Take 1 Tab by mouth every four (4) hours as needed for Pain. Max Daily Amount: 12 mg., Print, Disp-20 Tab, R-0         CONTINUE these medications which have NOT CHANGED    Details   albuterol (PROVENTIL HFA, VENTOLIN HFA, PROAIR HFA) 90 mcg/actuation inhaler Take 2 Puffs by inhalation every six (6) hours as needed for Shortness of Breath., Normal, Disp-1 Inhaler, R-5      pregabalin (LYRICA) 50 mg capsule Take 1 Cap by mouth three (3) times daily. Max Daily Amount: 150 mg., Print, Disp-90 Cap, R-0      QVAR  80 mcg/actuation inhaler Take 1 Puff by inhalation two (2) times a day., Normal, Disp-8.7 g, R-11, DAW      montelukast (SINGULAIR) 10 mg tablet TAKE 1 TABLET BY MOUTH DAILY, Normal**Patient requests 90 days supply**Disp-90 Tab, R-0      amLODIPine (NORVASC) 10 mg tablet TAKE 1 TABLET BY MOUTH DAILY, Normal**Patient requests 90 days supply**Disp-90 Tab, R-0      ALPRAZolam (XANAX) 0.5 mg tablet TAKE 1 TABLET BY MOUTH TWICE DAILY. MAX DAILY AMOUNT: 1MG , Print, Disp-60 Tab, R-0      oxyCODONE-acetaminophen (PERCOCET 10) 10-325 mg per tablet Take 1 Tab by mouth every six (6) hours as needed for Pain. Max Daily Amount: 4 Tabs., Print, Disp-90 Tab, R-0      ramipril (ALTACE) 5 mg capsule Take 1 Cap by mouth daily., Normal, Disp-30 Cap, R-0      fexofenadine (ALLEGRA) 180 mg tablet Take 180 mg by mouth daily., Historical Med       cyclobenzaprine (FLEXERIL) 10 mg tablet Take 0.5-1 Tabs by mouth three (3) times daily as needed for Muscle Spasm(s)., Normal, Disp-90 Tab, R-0      azelastine (ASTELIN) 137 mcg (0.1 %) nasal spray 2 Sprays by Both Nostrils route two (2) times a day., Normal, Disp-1 Bottle, R-4      pravastatin (PRAVACHOL) 40 mg tablet Take 1 Tab by mouth nightly., Normal, Disp-90 Tab, R-3      oxybutynin chloride XL (DITROPAN XL) 10 mg CR tablet Take 1 Tab by mouth daily., Normal, Disp-90 Tab, R-4      topiramate (TOPAMAX) 25 mg tablet 1 tab daily for 1 week then BID, Normal, Disp-60 Tab, R-5      mometasone (NASONEX) 50 mcg/actuation nasal spray 2 Sprays by Both Nostrils route daily., Normal, Disp-1 Container, R-5      metFORMIN (GLUCOPHAGE) 1,000 mg tablet TAKE 1 TABLET BY MOUTH TWICE DAILY WITH MEALS, Normal**Patient requests 90 days supply**Disp-180 Tab, R-3      meclizine (ANTIVERT) 25 mg tablet Take 1 Tab by mouth three (3) times daily as needed. Indications: VERTIGO, Print, Disp-10 Tab, R-0      mirtazapine (REMERON) 30 mg tablet Take 30 mg by mouth nightly., Historical Med      calcium citrate-vitamin d3 (CITRACAL+D) 315-200 mg-unit tab Take 1 Tab by mouth daily (with breakfast)., Historical Med      busPIRone (BUSPAR) 15 mg tablet Take 15 mg by mouth three (3) times daily., Historical Med, R-4      zolpidem (AMBIEN) 10 mg tablet Take 10 mg by mouth nightly., Historical Med, R-3      FLUoxetine (PROZAC) 40 mg capsule Take 40 mg by mouth daily., Historical Med, R-4      hydrochlorothiazide (HYDRODIURIL) 25 mg tablet Take 25 mg by mouth daily., Historical Med, R-4      EPINEPHrine (EPIPEN) 0.3 mg/0.3 mL injection 0.3 mg by IntraMUSCular route once as needed for Allergic Response., Historical Med      !! Lancets misc by Does Not Apply route., Historical Med      FREESTYLE LITE STRIPS strip USE TO CHECK BLOOD SUGAR TWICE DAILY, Normal**Patient requests 90 days supply**Disp-150 Strip, R-5       !! FREESTYLE LANCETS 28 gauge misc CHECK BLOOD SUGAR TWICE DAILY, Normal**Patient requests 90 days supply**Disp-300 Each, R-5       !! - Potential duplicate medications found. Please discuss with provider.            Activity: As tolerated    Diet: Cardiac Diet  and Diabetic Diet    Follow-up: with PCP, Marvia PicklesNoel L Hunte, MD in 1-2 weeks.      Melvern SampleArmando J. Lindie SpruceWyatt, MD  [facility]

## 2015-11-09 NOTE — Progress Notes (Signed)
CKD ruled out.

## 2015-11-09 NOTE — Other (Signed)
Discharge instructions explained and understood by the patient. IV discontinued, no redness, swelling or pain noted.  Pt discharged off the unit via wheelchair with [facility] transport.

## 2015-11-09 NOTE — Other (Signed)
Pt care received. Pt A/O x1. Pt resting in bed. Denies pain. Pt stable. Call light within reach, bed in lowest position and locked.

## 2015-11-09 NOTE — Progress Notes (Signed)
NUTRITION    BPA/MST Referral       RECOMMENDATIONS / PLAN:     - Add supplements: Glucerna Shake once daily   - Continue RD inpatient monitoring and evaluation     NUTRITION INTERVENTIONS & DIAGNOSIS:      Meals/Snacks: modified diet   Medical food supplementation: add     Vitamin and mineral supplementation: calcium-vitamin D   Nutrition related medication management: remeron      Nutrition Diagnosis: Unintentional weight loss related to inadequate energy intake as evidenced by 5 lb, 3% weight loss x 3 months.     Meets ASPEN/AND criteria for malnutrition:    Yes (see below)     No     Unable to determine at this time    ASSESSMENT:     Subjective:  Consuming most of meals and tolerating diet.   Current Appetite:    Good      Fair      Poor      Other:  Appetite/meal intake prior to admission:    Good      Fair      Poor      Other:    Diet: DIET DIABETIC CONSISTENT CARB Regular     Average po intake adequate to meet patients estimated nutritional needs:    Yes      No    Unable to determine at this time  Current Meal Intake: Patient Vitals for the past 100 hrs:   % Diet Eaten   11/09/15 0936 50 %   11/08/15 1822 100 %   11/07/15 1339 100 %   11/07/15 0923 40 %      Food Allergies: nuts (almonds per patient)   Feeding Limitations:   Swallowing difficulty     Chewing difficulty     Other:    BM:  12/4  Skin Integrity: Intact  Edema: 1+ RLE  Pertinent Medications: Reviewed     Labs:No results for input(s): NA, K, CL, CO2, GLU, BUN, CREA, CA, MG, PHOS, ALB, PREALB, TBIL, SGOT, ALT in the last 72 hours.    Intake/Output Summary (Last 24 hours) at 11/09/15 1404  Last data filed at 11/09/15 1124   Gross per 24 hour   Intake   1920 ml   Output    800 ml   Net   1120 ml       Anthropometrics:  Ht Readings from Last 1 Encounters:   11/06/15 $RemoveB'5\' 4"'KTRGeSmj$  (1.626 m)     Last 3 Recorded Weights in this Encounter    11/08/15 0707 11/09/15 0407 11/09/15 0413   Weight: 75 kg (165 lb 4.8 oz) 78.7 kg (173 lb 8 oz) 78.7 kg (173 lb 9.6  oz)     Body mass index is 29.8 kg/(m^2).  Overweight     Weight History: 5 lb, 3% weight loss over 3 months PTA per patient     Weight Metrics 11/09/2015 10/14/2015 09/17/2015 09/14/2015 09/07/2015 08/17/2015 08/06/2015   Weight 173 lb 9.6 oz 172 lb 176 lb 9.6 oz 169 lb 169 lb 3.2 oz 173 lb 6.4 oz 171 lb   BMI 29.8 kg/m2 29.52 kg/m2 30.31 kg/m2 29.01 kg/m2 29.04 kg/m2 29.76 kg/m2 29.35 kg/m2        Admitting Diagnosis: Acute pulmonary embolism (HCC)  Atypical chest pain  Atypical chest pain  Pulmonary emboli [facility])  Past Medical History   Diagnosis Date   ??? Anxiety    ??? Asthma    ??? Chronic kidney  disease    ??? Depression    ??? Diabetes ([place])    ??? Hypertension    ??? Insomnia    ??? Osteoporosis        Education Needs:         None identified   Identified - Not appropriate at this time    Identified and addressed - refer to education log  Learning Limitations:    None identified   Identified    Cultural, religious & ethnic food preferences:   None identified     Identified and addressed     ESTIMATED NUTRITION NEEDS:     Calories: 1596-1729 kcal (MSJx1.2-1.3) based on   Actual BW: 79 kg      IBW:   CHO: 200-216 gm (50% kcal)   Protein: 63-79 gm (0.8-1 gm/kg) based on   Actual BW: 79 kg      IBW:   Fluid: 1 mL/kcal     MONITORING & EVALUATION:     Nutrition Goal(s):  1. Po intake of meals will meet >75% of patient estimated nutritional needs within the next 5-7 days.  Outcome:   Met/Ongoing      Not Met     New/Initial Goal     Monitoring:    Monitor meal intake     Monitor diet tolerance      Monitor supplement intake    Previous Recommendations (for follow-up assessments only):        Implemented          Not Implemented (RD to address)      No Recommendation Made     Discharge Planning: cardiac, diabetic diet    Participated in care planning, discharge planning, & interdisciplinary rounds as appropriate      Cynthia Marsh, [place], Narcissa   Pager: [phone]

## 2015-11-09 NOTE — Other (Signed)
Bedside and Verbal shift change report given to Rhea BeltonKris Callaway RN (oncoming nurse) by Anselm Pancoasthauntee K Dancy, RN (offgoing nurse). Report included the following information SBAR, Kardex, MAR and Recent Results.    SITUATION:   ? Code Status: Full Code  Reason for Admission: Acute pulmonary embolism (HCC)  Atypical chest pain  Atypical chest pain  ? Pulmonary emboli (HCC)    ? Hospital day: 2  ? Problem List:       Hospital Problems  Date Reviewed: 11/06/2015          Codes Class Noted POA    Pulmonary emboli [facility](HCC) ICD-10-CM: I26.99  ICD-9-CM: 415.19  11/07/2015 Unknown        Atypical chest pain ICD-10-CM: R07.89  ICD-9-CM: 786.59  11/06/2015 Unknown        Acute pulmonary embolism (HCC) ICD-10-CM: I26.99  ICD-9-CM: 415.19  11/06/2015 Unknown              BACKGROUND:    Past Medical History:   Past Medical History   Diagnosis Date   ??? Anxiety    ??? Asthma    ??? Chronic kidney disease    ??? Depression    ??? Diabetes (HCC)    ??? Hypertension    ??? Insomnia    ??? Osteoporosis          Patient taking anticoagulants yes     ASSESSMENT:   ? Changes in Assessment Throughout Shift: none    ? Patient has Central Line: no Reasons if yes: n/a  ? Patient has Foley Cath: no Reasons if yes: n/a     ? Last Vitals:     Vitals:    11/09/15 0005   BP: 112/78   Pulse: 78   Resp: 12   Temp: 97.6 ??F (36.4 ??C)   SpO2: 92%   Weight:    Height:        ? IV and DRAINS (will only show if present)   Peripheral IV 11/06/15 Left Antecubital-Site Assessment: Clean, dry, & intact    ? WOUND (if present)   Wound Type:  none   Dressing present Dressing Present : No   Wound Concerns/Notes:  none    ? PAIN    Pain Assessment    Pain Intensity 1: 0 (11/08/15 2259)    Pain Location 1: Chest    Pain Intervention(s) 1: Medication (see MAR)    Patient Stated Pain Goal: 0  o Interventions for Pain:  none  o Intervention effective: no  o Time of last intervention: n/a   o Reassessment Completed: no     ? Last 3 Weights:  Last 3 Recorded Weights in this Encounter     11/06/15 0949 11/07/15 0430 11/08/15 0707   Weight: 78 kg (172 lb) 76.4 kg (168 lb 6.4 oz) 75 kg (165 lb 4.8 oz)     Weight change:     ? INTAKE/OUPUT    Current Shift:      Last three shifts: 12/03 0701 - 12/04 1900  In: 1110 [P.O.:1110]  Out: 1552 [Urine:1552]    ? LAB RESULTS     Recent Labs      11/06/15   1032   WBC  5.1   HGB  13.0   HCT  40.1   PLT  305        Recent Labs      11/06/15   1032   NA  140   K  3.9   GLU  107*  BUN  20*   CREA  0.78   CA  10.1   MG  1.8       RECOMMENDATIONS AND DISCHARGE PLANNING     1. Pending tests/procedures/ Plan of Care or Other Needs: none     2. Discharge plan for patient and Needs/Barriers: home vs snf    3. Estimated Discharge Date: 2-3 days Posted on Whiteboard in Patient???s Room: yes      4. The patient's care plan was reviewed with the oncoming nurse.       "HEALS" SAFETY CHECK      Fall Risk    Total Score: 3    Safety Measures: Safety Measures: Bed/Chair-Wheels locked, Bed in low position, Call light within reach    A safety check occurred in the patient's room between off going nurse and oncoming nurse listed above.    The safety check included the below items  Area Items   H  High Alert Medications ? Verify all high alert medication drips (heparin, PCA, etc.)   E  Equipment ? Suction is set up for ALL patients (with yanker)  ? Red plugs utilized for all equipment (IV pumps, etc.)  ? WOW???s wiped down at end of shift.  ? Room stocked with oxygen, suction, and other unit-specific supplies   A  Alarms ? Bed alarm is set for fall risk patients  ? Ensure chair alarm is in place and activated if patient is up in a chair   L  Lines ? Check IV for any infiltration  ? Foley bag is empty if patient has a Foley   ? Tubing and IV bags are labeled   S  Safety   ? Room is clean, patient is clean, and equipment is clean.  ? Hallways are clear from equipment besides carts.   ? Fall bracelet on for fall risk patients  ? Ensure room is clear and free of clutter   ? Suction is set up for ALL patients (with yanker)  ? Hallways are clear from equipment besides carts.   ? Isolation precautions followed, supplies available outside room, sign posted     Chauntee Wendall Stade, RN

## 2015-11-11 ENCOUNTER — Encounter

## 2015-11-11 MED ORDER — PREGABALIN 50 MG CAP
50 mg | ORAL_CAPSULE | Freq: Three times a day (TID) | ORAL | 5 refills | Status: DC
Start: 2015-11-11 — End: 2015-11-19

## 2015-11-11 NOTE — Patient Instructions (Signed)
This is documentation from previous entry 11/11/15. Patient has follow up visit 125/8/16

## 2015-11-11 NOTE — Patient Instructions (Signed)
Patient has follow up visit with Dr. Shawnie PonsHunte 11/12/15

## 2015-11-11 NOTE — Telephone Encounter (Signed)
Last OV 10/14/2015  Next OV 11/12/2015    Last Refill 10/14/2015 by Dr. Delford FieldWright

## 2015-11-12 ENCOUNTER — Ambulatory Visit: Admit: 2015-11-12 | Discharge: 2015-11-12 | Payer: MEDICARE | Attending: Internal Medicine | Primary: Family Medicine

## 2015-11-12 ENCOUNTER — Encounter: Attending: Internal Medicine | Primary: Family Medicine

## 2015-11-12 DIAGNOSIS — I2699 Other pulmonary embolism without acute cor pulmonale: Secondary | ICD-10-CM

## 2015-11-12 LAB — AMB POC URINALYSIS DIP STICK MANUAL W/O MICRO
Bilirubin (UA POC): NEGATIVE
Blood (UA POC): NEGATIVE
Glucose (UA POC): NEGATIVE
Ketones (UA POC): NEGATIVE
Leukocyte esterase (UA POC): NEGATIVE
Nitrites (UA POC): NEGATIVE
Protein (UA POC): NEGATIVE mg/dL
Specific gravity (UA POC): 1.02 (ref 1.001–1.035)
Urobilinogen (UA POC): 0.2 (ref 0.2–1)
pH (UA POC): 7.5 (ref 4.6–8.0)

## 2015-11-12 MED ORDER — OXYCODONE-ACETAMINOPHEN 10 MG-325 MG TAB
10-325 mg | ORAL_TABLET | Freq: Four times a day (QID) | ORAL | 0 refills | Status: DC | PRN
Start: 2015-11-12 — End: 2015-12-10

## 2015-11-12 MED ORDER — FLUTICASONE FUROATE 100 MCG/ACTUATION BREATH ACTIVATED INHALER
100 mcg/actuation | Freq: Every day | RESPIRATORY_TRACT | 5 refills | Status: DC
Start: 2015-11-12 — End: 2015-11-15

## 2015-11-12 NOTE — Patient Instructions (Signed)
Pulmonary Embolism: Care Instructions  Your Care Instructions  Pulmonary embolism is the sudden blockage of an artery in the lung. Blood clots in the deep veins of the leg or pelvis (deep vein thrombosis, or DVT) are the most common cause. These blood clots can travel to the lungs.  Pulmonary embolism can be very serious. Because you have had one pulmonary embolism, you are at greater risk for having another one. But you can take steps to prevent another pulmonary embolism by following your doctor's instructions.  You will probably take a prescription blood-thinning medicine to prevent blood clots. A blood thinner can stop a blood clot from growing larger and prevent new clots from forming.  Follow-up care is a key part of your treatment and safety. Be sure to make and go to all appointments, and call your doctor if you are having problems. It's also a good idea to know your test results and keep a list of the medicines you take.  How can you care for yourself at home?  ?? Take your medicines exactly as prescribed. Call your doctor if you think you are having a problem with your medicine. You will get more details on the specific medicines your doctor prescribes.  ?? If you are taking a blood thinner, be sure you get instructions about how to take your medicine safely. Blood thinners can cause serious bleeding problems.  Preventing future pulmonary embolisms  ?? Exercise. Keep blood moving in your legs to keep clots from forming. If you are traveling by car, stop every hour or so. Get out and walk around for a few minutes. If you are traveling by bus, train, or plane, get out of your seat and walk up and down the aisles every hour or so. You also can do leg exercises while you are seated. Pump your feet up and down by pulling your toes up toward your knees then pointing them down.  ?? Get up out of bed as soon as possible after an illness or surgery.   ?? Do not smoke. If you need help quitting, talk to your doctor about stop-smoking programs and medicines. These can increase your chances of quitting for good.  ?? Check with your doctor before taking hormone or birth control pills. These may increase your risk of blot clots.  ?? Ask your doctor about wearing compression stockings to help prevent blood clots in your legs. You can buy these with a prescription at medical supply stores and some drugstores.  When should you call for help?  Call 911 anytime you think you may need emergency care. For example, call if:  ?? You have shortness of breath.  ?? You have chest pain.  ?? You passed out (lost consciousness).  ?? You cough up blood.  Call your doctor now or seek immediate medical care if:  ?? You have new or worsening pain or swelling in your leg.  Watch closely for changes in your health, and be sure to contact your doctor if:  ?? You do not get better as expected.  Where can you learn more?  Go to http://www.healthwise.net/[employer]  Enter A877 in the search box to learn more about "Pulmonary Embolism: Care Instructions."  ?? 2006-2016 [employer]. Care instructions adapted under license by [employer] (which disclaims liability or warranty for this information). This care instruction is for use with your licensed healthcare professional. If you have questions about a medical condition or this instruction, always ask your healthcare professional. Healthwise, Inc[employer]   disclaims any warranty or liability for your use of this information.  Content Version: 11.0.578772; Current as of: May 09, 2015

## 2015-11-12 NOTE — Progress Notes (Signed)
Patient is in the office today for a hospital  follow up.  Patient states she will need a refiill on Xarleto.    Do you have an Advance Directive yes      1. Have you been to the ER, urgent care clinic since your last visit?  Hospitalized since your last visit?yes, [facility].    2. Have you seen or consulted any other health care providers outside of [facility] since your last visit?  Include any pap smears or colon screening. No

## 2015-11-12 NOTE — Progress Notes (Signed)
Cynthia Marsh is a 64 y.o.  female and presents with Hospital Follow Up ([facility]); Asthma (was in car for 4 hrs then chest pain arnuity better than qvar ); Blood Clot (will be on Xarelto for 6 months  had left leg pain then chest pain ); Leg Swelling (mild try compression stockings ); and Urinary Frequency (check urine )      SUBJECTIVE:  Pt was admitted 11/06/15 to 11/09/15 for pulmonary embolism. She initially went to the ER with chest pain and w/u showed pulmonary embolism. Pt a few days before she had the chest pain went on a 4 hour trip to [place]. When she came back from the trip she was having right knee pain and some swelling and was placed in a knee immobilizer. Pt is currently on Xarelto and will continue on medication for 3 to 6 months depending on how well she is doing. Her PVLs were negative so would lean towards 6 months. Pt has been having LE edema due to venous insuffiencey. She will use compression stockings to try and control the leg swelling. Her 2D Echo was normal. Pt c/o of some recent increased urinary frequency. Her BS have been under good control. Pt continues on Percocet which also helps to control some of the mild chest discomfort she is having.  Pt also has Asthma for which she was on Qvar but changed to Arnuity in hospital which worked better for her. If insurance does not pay for it will consider other steroid inhaler like Pulmicort.        Respiratory ROS: negative for - hemoptysis   Cardiovascular ROS: negative for - palpitations     Current Outpatient Prescriptions   Medication Sig   ??? oxyCODONE-acetaminophen (PERCOCET 10) 10-325 mg per tablet Take 1 Tab by mouth every six (6) hours as needed for Pain. Max Daily Amount: 4 Tabs.   ??? pregabalin (LYRICA) 50 mg capsule Take 1 Cap by mouth three (3) times daily. Max Daily Amount: 150 mg.   ??? rivaroxaban (XARELTO) 15 mg (42)- 20 mg (9) DsPk Take as directed.  Take with food, at approximately the same time each day.    ??? HYDROmorphone (DILAUDID) 2 mg tablet Take 1 Tab by mouth every four (4) hours as needed for Pain. Max Daily Amount: 12 mg.   ??? amLODIPine (NORVASC) 10 mg tablet TAKE 1 TABLET BY MOUTH DAILY   ??? ALPRAZolam (XANAX) 0.5 mg tablet TAKE 1 TABLET BY MOUTH TWICE DAILY. MAX DAILY AMOUNT:    ??? ramipril (ALTACE) 5 mg capsule Take 1 Cap by mouth daily.   ??? fexofenadine (ALLEGRA) 180 mg tablet Take 180 mg by mouth daily.   ??? EPINEPHrine (EPIPEN) 0.3 mg/0.3 mL injection 0.3 mg by IntraMUSCular route once as needed for Allergic Response.   ??? cyclobenzaprine (FLEXERIL) 10 mg tablet Take 0.5-1 Tabs by mouth three (3) times daily as needed for Muscle Spasm(s).   ??? azelastine (ASTELIN) 137 mcg (0.1 %) nasal spray 2 Sprays by Both Nostrils route two (2) times a day.   ??? pravastatin (PRAVACHOL) 40 mg tablet Take 1 Tab by mouth nightly.   ??? oxybutynin chloride XL (DITROPAN XL) 10 mg CR tablet Take 1 Tab by mouth daily.   ??? Lancets misc by Does Not Apply route.   ??? topiramate (TOPAMAX) 25 mg tablet 1 tab daily for 1 week then BID   ??? mometasone (NASONEX) 50 mcg/actuation nasal spray 2 Sprays by Both Nostrils route daily.   ??? FREESTYLE LITE STRIPS  strip USE TO CHECK BLOOD SUGAR TWICE DAILY   ??? FREESTYLE LANCETS 28 gauge misc CHECK BLOOD SUGAR TWICE DAILY   ??? metFORMIN (GLUCOPHAGE) 1,000 mg tablet TAKE 1 TABLET BY MOUTH TWICE DAILY WITH MEALS   ??? mirtazapine (REMERON) 30 mg tablet Take 30 mg by mouth nightly.   ??? calcium citrate-vitamin d3 (CITRACAL+D) 315-200 mg-unit tab Take 1 Tab by mouth daily (with breakfast).   ??? busPIRone (BUSPAR) 15 mg tablet Take 15 mg by mouth three (3) times daily.   ??? zolpidem (AMBIEN) 10 mg tablet Take 10 mg by mouth nightly.   ??? FLUoxetine (PROZAC) 40 mg capsule Take 40 mg by mouth daily.   ??? hydrochlorothiazide (HYDRODIURIL) 25 mg tablet Take 25 mg by mouth daily.   ??? montelukast (SINGULAIR) 10 mg tablet TAKE 1 TABLET BY MOUTH DAILY    ??? budesonide (PULMICORT) 180 mcg/actuation aepb inhaler Take 2 Puffs by inhalation two (2) times a day.   ??? albuterol (PROVENTIL HFA, VENTOLIN HFA, PROAIR HFA) 90 mcg/actuation inhaler Take 2 Puffs by inhalation every six (6) hours as needed for Shortness of Breath.   ??? meclizine (ANTIVERT) 25 mg tablet Take 1 Tab by mouth three (3) times daily as needed. Indications: VERTIGO     No current facility-administered medications for this visit.          OBJECTIVE:  alert, well appearing, and in no distress  Visit Vitals   ??? BP 110/80 (BP 1 Location: Left arm, BP Patient Position: Sitting)   ??? Pulse 84   ??? Temp 98.4 ??F (36.9 ??C) (Oral)   ??? Resp 20   ??? Ht 5\' 4"  (1.626 m)   ??? Wt 177 lb (80.3 kg)   ??? SpO2 98%   ??? BMI 30.38 kg/m2      well developed and well nourished  Chest - clear to auscultation, no wheezes, rales or rhonchi, symmetric air entry  Heart - normal rate, regular rhythm, normal S1, S2, no murmurs, rubs, clicks or gallops  Extremities - pedal edema 1 +    Labs:   Lab Results   Component Value Date/Time    WBC 5.1 11/06/2015 10:32 AM    HGB 13.0 11/06/2015 10:32 AM    HCT 40.1 11/06/2015 10:32 AM    PLATELET 305 11/06/2015 10:32 AM    MCV 84.2 11/06/2015 10:32 AM       Lab Results   Component Value Date/Time    GFR EST AA >60 11/06/2015 10:32 AM    GFR EST NON-AA >60 11/06/2015 10:32 AM    CREATININE 0.78 11/06/2015 10:32 AM    BUN 20 11/06/2015 10:32 AM    SODIUM 140 11/06/2015 10:32 AM    POTASSIUM 3.9 11/06/2015 10:32 AM    CHLORIDE 101 11/06/2015 10:32 AM    CO2 29 11/06/2015 10:32 AM          Assessment/Plan      ICD-10-CM ICD-9-CM    1. Other acute pulmonary embolism without acute cor pulmonale (HCC) I26.99  Pt doing well on Xarelto. Will consider treatment for 3-6 months since there is known cause     2. Chronic bilateral low back pain without sciatica M54.5 724.2 controlled on oxyCODONE-acetaminophen (PERCOCET 10) 10-325 mg per tablet    G89.29 338.29     3. Mild intermittent asthma without complication J45.20 493.90 Controlled on steroid inhaler    4. Urinary frequency R35.0 788.41 AMB POC URINALYSIS DIP STICK MANUAL W/O MICRO   5. Encounter for screening mammogram  for malignant neoplasm of breast  Z12.31 V76.12 MAM MAMMO BI SCREENING DIGTL     Follow-up Disposition:  Return in about 3 weeks (around 12/03/2015) for PE.    Reviewed plan of care. Patient has provided input and agrees with goals.

## 2015-11-13 ENCOUNTER — Ambulatory Visit: Admit: 2015-11-13 | Discharge: 2015-11-13 | Payer: MEDICARE | Attending: Physician Assistant | Primary: Family Medicine

## 2015-11-13 ENCOUNTER — Encounter

## 2015-11-13 DIAGNOSIS — M1711 Unilateral primary osteoarthritis, right knee: Secondary | ICD-10-CM

## 2015-11-13 NOTE — Telephone Encounter (Signed)
PA started for medication.

## 2015-11-13 NOTE — Progress Notes (Signed)
[facility]  [address], [address]  [place] [address]  [phone]           Patient: Cynthia Marsh                MRN: [id number]       SSN: [id number]  Date of Birth: 1951/04/09        AGE: 64 y.o.        SEX: female  Body mass index is 30.21 kg/(m^2).    PCP: Marvia Pickles, MD  11/13/15      This office note has been dictated.      REVIEW OF SYSTEMS:  Constitutional: Negative for fever, chills, weight loss and malaise/fatigue.   HENT: Negative.    Eyes: Negative.    Respiratory: Negative.   Cardiovascular: Negative.   Gastrointestinal: No bowel incontinence or constipation.  Genitourinary: No bladder incontinence or saddle anesthesia.  Skin: Negative.   Neurological: Negative.    Endo/Heme/Allergies: Negative.    Psychiatric/Behavioral: Negative.  Musculoskeletal: As per HPI above.     Past Medical History   Diagnosis Date   ??? Anxiety    ??? Asthma    ??? Chronic kidney disease    ??? Depression    ??? Diabetes (HCC)    ??? Hypertension    ??? Insomnia    ??? Osteoporosis    ??? Thromboembolus (HCC)          Current Outpatient Prescriptions:   ???  fluticasone furoate (ARNUITY ELLIPTA) 100 mcg/actuation dsdv inhaler, Take 1 Puff by inhalation daily., Disp: 1 Inhaler, Rfl: 5  ???  oxyCODONE-acetaminophen (PERCOCET 10) 10-325 mg per tablet, Take 1 Tab by mouth every six (6) hours as needed for Pain. Max Daily Amount: 4 Tabs., Disp: 90 Tab, Rfl: 0  ???  pregabalin (LYRICA) 50 mg capsule, Take 1 Cap by mouth three (3) times daily. Max Daily Amount: 150 mg., Disp: 90 Cap, Rfl: 5  ???  rivaroxaban (XARELTO) 15 mg (42)- 20 mg (9) DsPk, Take as directed. Take with food, at approximately the same time each day., Disp: 1 Dose Pack, Rfl: 0  ???  HYDROmorphone (DILAUDID) 2 mg tablet, Take 1 Tab by mouth every four (4) hours as needed for Pain. Max Daily Amount: 12 mg., Disp: 20 Tab, Rfl: 0  ???  albuterol (PROVENTIL HFA, VENTOLIN HFA, PROAIR HFA) 90 mcg/actuation  inhaler, Take 2 Puffs by inhalation every six (6) hours as needed for Shortness of Breath., Disp: 1 Inhaler, Rfl: 5  ???  QVAR 80 mcg/actuation inhaler, Take 1 Puff by inhalation two (2) times a day., Disp: 8.7 g, Rfl: 11  ???  montelukast (SINGULAIR) 10 mg tablet, TAKE 1 TABLET BY MOUTH DAILY, Disp: 90 Tab, Rfl: 0  ???  amLODIPine (NORVASC) 10 mg tablet, TAKE 1 TABLET BY MOUTH DAILY, Disp: 90 Tab, Rfl: 0  ???  ALPRAZolam (XANAX) 0.5 mg tablet, TAKE 1 TABLET BY MOUTH TWICE DAILY. MAX DAILY AMOUNT: , Disp: 60 Tab, Rfl: 0  ???  ramipril (ALTACE) 5 mg capsule, Take 1 Cap by mouth daily., Disp: 30 Cap, Rfl: 0  ???  fexofenadine (ALLEGRA) 180 mg tablet, Take 180 mg by mouth daily., Disp: , Rfl:   ???  EPINEPHrine (EPIPEN) 0.3 mg/0.3 mL injection, 0.3 mg by IntraMUSCular route once as needed for Allergic Response., Disp: , Rfl:   ???  azelastine (ASTELIN) 137 mcg (0.1 %) nasal spray, 2 Sprays by Both Nostrils route two (2) times  a day., Disp: 1 Bottle, Rfl: 4  ???  pravastatin (PRAVACHOL) 40 mg tablet, Take 1 Tab by mouth nightly., Disp: 90 Tab, Rfl: 3  ???  oxybutynin chloride XL (DITROPAN XL) 10 mg CR tablet, Take 1 Tab by mouth daily., Disp: 90 Tab, Rfl: 4  ???  Lancets misc, by Does Not Apply route., Disp: , Rfl:   ???  topiramate (TOPAMAX) 25 mg tablet, 1 tab daily for 1 week then BID, Disp: 60 Tab, Rfl: 5  ???  mometasone (NASONEX) 50 mcg/actuation nasal spray, 2 Sprays by Both Nostrils route daily., Disp: 1 Container, Rfl: 5  ???  FREESTYLE LITE STRIPS strip, USE TO CHECK BLOOD SUGAR TWICE DAILY, Disp: 150 Strip, Rfl: 5  ???  FREESTYLE LANCETS 28 gauge misc, CHECK BLOOD SUGAR TWICE DAILY, Disp: 300 Each, Rfl: 5  ???  metFORMIN (GLUCOPHAGE) 1,000 mg tablet, TAKE 1 TABLET BY MOUTH TWICE DAILY WITH MEALS, Disp: 180 Tab, Rfl: 3  ???  meclizine (ANTIVERT) 25 mg tablet, Take 1 Tab by mouth three (3) times daily as needed. Indications: VERTIGO, Disp: 10 Tab, Rfl: 0  ???  mirtazapine (REMERON) 30 mg tablet, Take 30 mg by mouth nightly., Disp: , Rfl:    ???  calcium citrate-vitamin d3 (CITRACAL+D) 315-200 mg-unit tab, Take 1 Tab by mouth daily (with breakfast)., Disp: , Rfl:   ???  busPIRone (BUSPAR) 15 mg tablet, Take 15 mg by mouth three (3) times daily., Disp: , Rfl: 4  ???  zolpidem (AMBIEN) 10 mg tablet, Take 10 mg by mouth nightly., Disp: , Rfl: 3  ???  FLUoxetine (PROZAC) 40 mg capsule, Take 40 mg by mouth daily., Disp: , Rfl: 4  ???  hydrochlorothiazide (HYDRODIURIL) 25 mg tablet, Take 25 mg by mouth daily., Disp: , Rfl: 4  ???  cyclobenzaprine (FLEXERIL) 10 mg tablet, Take 0.5-1 Tabs by mouth three (3) times daily as needed for Muscle Spasm(s)., Disp: 90 Tab, Rfl: 0    Allergies   Allergen Reactions   ??? 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   ??? Almond Other (comments)     Tongue itches   ??? Carvedilol Other (comments)   ??? Cozaar [Losartan] Cough   ??? Crestor [Rosuvastatin] Unknown (comments)     Heart beats fast       Social History     Social History   ??? Marital status: SINGLE     Spouse name: N/A   ??? Number of children: N/A   ??? Years of education: N/A     Occupational History   ??? Not on file.     Social History Main Topics   ??? Smoking status: Former Smoker   ??? Smokeless tobacco: Never Used   ??? Alcohol use Yes      Comment: on occasion   ??? Drug use: No   ??? Sexual activity: Not on file     Other Topics Concern   ??? Not on file     Social History Narrative       Past Surgical History   Procedure Laterality Date   ??? Pr anesth,surgery of shoulder     ??? Pr hand/finger surgery unlisted     ??? Pr laminectomy,cervical     ??? Hx cyst removal       calf    ??? Hx gyn       complete hyst    ??? Hx gyn       tubal ligation   ???  Hx wrist fracture tx     ??? Hx back surgery  2012     L4/L5/S1 fused in [place]   ??? Hx orthopaedic       Right shoulder rotator cuff    ??? Hx orthopaedic       Left Little toe hammer toe, left big toe bunion removed   ??? Hx orthopaedic       Right knee torn meniscus   ??? Hx orthopaedic       CTS bilateral , Left thumb trigger finger     ??? Pr breast surgery procedure unlisted        Left cyst removed              We did see Cynthia Marsh for follow-up in regards to her right knee. The patient did receive a Cortisone injection back in October, which gave her only approximately eight days??? worth of relief.  She does state that [place] went [place] her knee swelled up considerably. She went to the emergency room where she was treated with a brace. The knee has improved, however, still causing a lot of discomfort. It is waking her up at night. She does get some instability in the knee with some giving out at times.  She denies any fevers or chills.        PHYSICAL EXAMINATION: In general the patient is alert and oriented x 3 and is in no acute distress.  The patient is well-developed and well-nourished with a normal affect. The patient is afebrile.  HEENT:  Head is normocephalic and atraumatic.  Pupils are equally round and reactive to light and accommodation.  Extraocular eye movements are intact.  Neck is supple.  Trachea is midline. No JVD is present.  Breathing is nonlabored.  Examination of the lower extremities reveals pain free range of motion of the hips. There is no pain with palpation to the trochanteric bursae. There is negative straight leg raise. There is negative calf tenderness and swelling.  There is negative Homans.  There is no evidence of DVT noted. Examination of the right knee reveals the skin to be intact. There is no erythema or ecchymosis. There is no warmth or signs for infection or cellulitis. There is negative joint effusion and negative patellar ballottement. She does have discomfort with palpation to the medial joint line, as well as patellofemoral grind. She does have some instability in the knee especially to the lateral side where the LCL is stretched.  Range of motion is well preserved. Neurovascular status is intact. She has discomfort with McMurray???s test with a palpable click present medially.       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         PLAN: At this point we will order a rheumatologic workup on the patient. We will also order an MRI of the right knee to evaluate for meniscal pathology.  We will see her back afterwards for review.  We have ordered a hinged knee brace for her.                JR Thetis Schwimmer MPAS, PA-C, ATC

## 2015-11-16 MED ORDER — BUDESONIDE 180 MCG/INHALATION BREATH ACTIVATED POWDER AEROSOL
180 mcg/actuation | Freq: Two times a day (BID) | RESPIRATORY_TRACT | 5 refills | Status: DC
Start: 2015-11-16 — End: 2016-01-11

## 2015-11-16 NOTE — Telephone Encounter (Signed)
Last OV 11/12/2015  Next OV 12/10/2015

## 2015-11-17 MED ORDER — MONTELUKAST 10 MG TAB
10 mg | ORAL_TABLET | ORAL | 0 refills | Status: DC
Start: 2015-11-17 — End: 2016-01-11

## 2015-11-17 NOTE — Telephone Encounter (Signed)
Patient called in and stated that she is taking arnuity ellipta 100 and she is unable to get this medication from her pharmacy and wanted to know if something else could be called in for her.Please advise

## 2015-11-18 NOTE — Telephone Encounter (Signed)
Forwarding to Dr.Hunte.

## 2015-11-19 ENCOUNTER — Ambulatory Visit: Admit: 2015-11-19 | Discharge: 2015-11-19 | Payer: MEDICARE | Attending: Family Medicine | Primary: Family Medicine

## 2015-11-19 ENCOUNTER — Encounter

## 2015-11-19 DIAGNOSIS — J069 Acute upper respiratory infection, unspecified: Secondary | ICD-10-CM

## 2015-11-19 MED ORDER — HYDROCODONE 10 MG-CHLORPHENIRAMINE 8 MG/5 ML ORAL SUSP EXTEND.REL 12HR
10-8 mg/5 mL | Freq: Two times a day (BID) | ORAL | 0 refills | Status: DC | PRN
Start: 2015-11-19 — End: 2015-12-10

## 2015-11-19 MED ORDER — AZITHROMYCIN 250 MG TAB
250 mg | ORAL_TABLET | ORAL | 0 refills | Status: AC
Start: 2015-11-19 — End: 2015-11-24

## 2015-11-19 NOTE — Progress Notes (Signed)
HISTORY OF PRESENT ILLNESS  Cynthia LevanFrankie Tax is a 64 y.o. female.  HPI  64 year old established female patient in today for an acute concern.    Patient with hx of recent dx of PE in the left lower lung lobe currently on xarelto.    She reports 1 month of dry cough.  1 day ago her cough became productive with clear mucous.  She reports chills, sweats and HA.  She also reports posterior chest wall since her dx of PE.  She denies SOB and palpitations.  HA is throbbing, 6/10, with relief from percocet last evening.  She denies sick contacts, nausea, vomiting, diarrhea or constipation    She reports decreased water intake    She has watery crusty eyes which she reports is irritated.      Allergies   Allergen Reactions   ??? 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   ??? Almond Other (comments)     Tongue itches   ??? Carvedilol Other (comments)   ??? Cozaar [Losartan] Cough   ??? Crestor [Rosuvastatin] Unknown (comments)     Heart beats fast       Past Medical History   Diagnosis Date   ??? Anxiety    ??? Asthma    ??? Chronic kidney disease    ??? Depression    ??? Diabetes (HCC)    ??? Hypertension    ??? Insomnia    ??? Osteoporosis    ??? Thromboembolus (HCC)        Family History   Problem Relation Age of Onset   ??? Diabetes Mother    ??? Diabetes Brother        Social History   Substance Use Topics   ??? Smoking status: Former Smoker   ??? Smokeless tobacco: Never Used   ??? Alcohol use Yes      Comment: on occasion        Current Outpatient Prescriptions   Medication Sig   ??? azithromycin (ZITHROMAX) 250 mg tablet Take 2 tablets today, then take 1 tablet daily   ??? chlorpheniramine-HYDROcodone (TUSSIONEX) 10-8 mg/5 mL suspension Take 5 mL by mouth every twelve (12) hours as needed for Cough. Max Daily Amount: 10 mL.   ??? montelukast (SINGULAIR) 10 mg tablet TAKE 1 TABLET BY MOUTH DAILY   ??? budesonide (PULMICORT) 180 mcg/actuation aepb inhaler Take 2 Puffs by inhalation two (2) times a day.    ??? oxyCODONE-acetaminophen (PERCOCET 10) 10-325 mg per tablet Take 1 Tab by mouth every six (6) hours as needed for Pain. Max Daily Amount: 4 Tabs.   ??? pregabalin (LYRICA) 50 mg capsule Take 1 Cap by mouth three (3) times daily. Max Daily Amount: 150 mg.   ??? rivaroxaban (XARELTO) 15 mg (42)- 20 mg (9) DsPk Take as directed.  Take with food, at approximately the same time each day.   ??? HYDROmorphone (DILAUDID) 2 mg tablet Take 1 Tab by mouth every four (4) hours as needed for Pain. Max Daily Amount: 12 mg.   ??? amLODIPine (NORVASC) 10 mg tablet TAKE 1 TABLET BY MOUTH DAILY   ??? ALPRAZolam (XANAX) 0.5 mg tablet TAKE 1 TABLET BY MOUTH TWICE DAILY. MAX DAILY AMOUNT: 1MG    ??? ramipril (ALTACE) 5 mg capsule Take 1 Cap by mouth daily.   ??? fexofenadine (ALLEGRA) 180 mg tablet Take 180 mg by mouth daily.   ??? azelastine (ASTELIN) 137 mcg (0.1 %) nasal spray 2 Sprays by Both Nostrils  route two (2) times a day.   ??? pravastatin (PRAVACHOL) 40 mg tablet Take 1 Tab by mouth nightly.   ??? oxybutynin chloride XL (DITROPAN XL) 10 mg CR tablet Take 1 Tab by mouth daily.   ??? Lancets misc by Does Not Apply route.   ??? topiramate (TOPAMAX) 25 mg tablet 1 tab daily for 1 week then BID   ??? mometasone (NASONEX) 50 mcg/actuation nasal spray 2 Sprays by Both Nostrils route daily.   ??? FREESTYLE LITE STRIPS strip USE TO CHECK BLOOD SUGAR TWICE DAILY   ??? FREESTYLE LANCETS 28 gauge misc CHECK BLOOD SUGAR TWICE DAILY   ??? metFORMIN (GLUCOPHAGE) 1,000 mg tablet TAKE 1 TABLET BY MOUTH TWICE DAILY WITH MEALS   ??? meclizine (ANTIVERT) 25 mg tablet Take 1 Tab by mouth three (3) times daily as needed. Indications: VERTIGO   ??? mirtazapine (REMERON) 30 mg tablet Take 30 mg by mouth nightly.   ??? calcium citrate-vitamin d3 (CITRACAL+D) 315-200 mg-unit tab Take 1 Tab by mouth daily (with breakfast).   ??? busPIRone (BUSPAR) 15 mg tablet Take 15 mg by mouth three (3) times daily.   ??? zolpidem (AMBIEN) 10 mg tablet Take 10 mg by mouth nightly.    ??? FLUoxetine (PROZAC) 40 mg capsule Take 40 mg by mouth daily.   ??? hydrochlorothiazide (HYDRODIURIL) 25 mg tablet Take 25 mg by mouth daily.   ??? albuterol (PROVENTIL HFA, VENTOLIN HFA, PROAIR HFA) 90 mcg/actuation inhaler Take 2 Puffs by inhalation every six (6) hours as needed for Shortness of Breath.   ??? EPINEPHrine (EPIPEN) 0.3 mg/0.3 mL injection 0.3 mg by IntraMUSCular route once as needed for Allergic Response.   ??? cyclobenzaprine (FLEXERIL) 10 mg tablet Take 0.5-1 Tabs by mouth three (3) times daily as needed for Muscle Spasm(s).     No current facility-administered medications for this visit.         Past Surgical History   Procedure Laterality Date   ??? Pr anesth,surgery of shoulder     ??? Pr hand/finger surgery unlisted     ??? Pr laminectomy,cervical     ??? Hx cyst removal       calf    ??? Hx gyn       complete hyst    ??? Hx gyn       tubal ligation   ??? Hx wrist fracture tx     ??? Hx back surgery  2012     L4/L5/S1 fused in [place]   ??? Hx orthopaedic       Right shoulder rotator cuff    ??? Hx orthopaedic       Left Little toe hammer toe, left big toe bunion removed   ??? Hx orthopaedic       Right knee torn meniscus   ??? Hx orthopaedic       CTS bilateral , Left thumb trigger finger    ??? Pr breast surgery procedure unlisted        Left cyst removed          ROS  See HPI    Visit Vitals   ??? BP 104/75   ??? Pulse 88   ??? Temp 98.1 ??F (36.7 ??C) (Oral)   ??? Resp 16   ??? Ht  (1.626 m)   ??? Wt 172 lb 12.8 oz (78.4 kg)   ??? SpO2 100%   ??? BMI 29.66 kg/m2     Physical Exam   Constitutional: She is oriented to person, place,  and time.   Uses walker, ill appearing   HENT:   Nose: No mucosal edema or rhinorrhea. Right sinus exhibits no maxillary sinus tenderness and no frontal sinus tenderness. Left sinus exhibits no maxillary sinus tenderness and no frontal sinus tenderness.   Mouth/Throat: Mucous membranes are dry. Posterior oropharyngeal erythema present.   Eyes:   Irritated conjunctiva b/l   Cardiovascular: Tachycardia present.     No murmur heard.  Occasional skipped beat   Pulmonary/Chest: She has decreased breath sounds.   Decreased breath sounds in the b/l upper lobes   Musculoskeletal: Normal range of motion.   Neurological: She is alert and oriented to person, place, and time.       ASSESSMENT and PLAN    ICD-10-CM ICD-9-CM    1. Upper respiratory tract infection, unspecified type J06.9 465.9 XR CHEST PA LAT      azithromycin (ZITHROMAX) 250 mg tablet      chlorpheniramine-HYDROcodone (TUSSIONEX) 10-8 mg/5 mL suspension   2. Other pulmonary embolism without acute cor pulmonale, unspecified chronicity (HCC) I26.99 415.19 EKG, 12 LEAD, INITIAL     -Emergent ED precautions reviewed.  -Patient to have imaging done today for evaluation.  -RTC 1 week follow up     Additional Instructions:  The patient understands that they should contact the office at any time if any questions or concerns develop.  They are also aware that they can call our main office number at [phone] at any time if they would like to address any concerns with the physician.  They also understand that they should dial 911 if any acute emergency arises.  The patient understands that they should give [place] a minimum of 48 hours to complete prescription refills once they are requested.  The patient has also been instructed to contact us by calling the main office number if they have not received feedback within 2 weeks of having any tests completed.  The patient is a aware that they should read all package insert information when picking up the medications and that they should consult the pharmacist of a physician if they have any questions or concerns regarding the prescribed medications.  Discussed with the patient new medications given and patient instructed to read pharmacy literature regarding side effects and drug interactions.  Instructions for taking the medications were provided to the patient and the consequences of not taking it.    Follow-up Disposition:    Return if symptoms worsen or fail to improve.   Risk and benefits of new medication discussed in detail when indicated, patient was given the opportunity to ask questions   AVS provided  reviewed diet, exercise and weight control when indicated  Alarm signals discussed. ER precautions reviewed when indicated  Plan of care reviewed with patient. Understanding verbalized and they are in agreement with plan of care.     Baruch Goldmann, DO

## 2015-11-19 NOTE — Patient Instructions (Addendum)
Upper Respiratory Infection (Cold): Care Instructions  Your Care Instructions     An upper respiratory infection, or URI, is an infection of the nose, sinuses, or throat. URIs are spread by coughs, sneezes, and direct contact. The common cold is the most frequent kind of URI. The flu and sinus infections are other kinds of URIs.  Almost all URIs are caused by viruses. Antibiotics won't cure them. But you can treat most infections with home care. This may include drinking lots of fluids and taking over-the-counter pain medicine. You will probably feel better in 4 to 10 days.  The doctor has checked you carefully, but problems can develop later. If you notice any problems or new symptoms, get medical treatment right away.  Follow-up care is a key part of your treatment and safety. Be sure to make and go to all appointments, and call your doctor if you are having problems. It's also a good idea to know your test results and keep a list of the medicines you take.  How can you care for yourself at home?  ?? To prevent dehydration, drink plenty of fluids, enough so that your urine is light yellow or clear like water. Choose water and other caffeine-free clear liquids until you feel better. If you have kidney, heart, or liver disease and have to limit fluids, talk with your doctor before you increase the amount of fluids you drink.  ?? Take an over-the-counter pain medicine, such as acetaminophen (Tylenol), ibuprofen (Advil, Motrin), or naproxen (Aleve). Read and follow all instructions on the label.  ?? Before you use cough and cold medicines, check the label. These medicines may not be safe for young children or for people with certain health problems.  ?? Be careful when taking over-the-counter cold or flu medicines and Tylenol at the same time. Many of these medicines have acetaminophen, which is Tylenol. Read the labels to make sure that you are not taking  more than the recommended dose. Too much acetaminophen (Tylenol) can be harmful.  ?? Get plenty of rest.  ?? Do not smoke or allow others to smoke around you. If you need help quitting, talk to your doctor about stop-smoking programs and medicines. These can increase your chances of quitting for good.  When should you call for help?  Call 911 anytime you think you may need emergency care. For example, call if:  ?? You have severe trouble breathing.  Call your doctor now or seek immediate medical care if:  ?? You seem to be getting much sicker.  ?? You have new or worse trouble breathing.  ?? You have a new or higher fever.  ?? You have a new rash.  Watch closely for changes in your health, and be sure to contact your doctor if:  ?? You have a new symptom, such as a sore throat, an earache, or sinus pain.  ?? You cough more deeply or more often, especially if you notice more mucus or a change in the color of your mucus.  ?? You do not get better as expected.  Where can you learn more?  Go to http://www.healthwise.net/[employer]Help[employer]ctions  Enter K520 in the search box to learn more about "Upper Respiratory Infection (Cold): Care Instructions."  ?? 2006-2016 [employer]. Care instructions adapted under license by [employer] (which disclaims liability or warranty for this information). This care instruction is for use with your licensed healthcare professional. If you have questions about a medical condition or this instruction, always ask your   healthcare professional. Healthwise, Incorporated disclaims any warranty or liability for your use of this information.  Content Version: 11.0.578772; Current as of: June 04, 2015

## 2015-11-19 NOTE — Progress Notes (Signed)
Pt is here for being tired, cough, sneezing,  generalized bodyaches that started last night    Do you have an advance directive yes    Pt declined  mychart activation.    1. Have you been to the ER, urgent care clinic since your last visit?  Hospitalized since your last visit?No    2. Have you seen or consulted any other health care providers outside of the [facility] since your last visit?  Include any pap smears or colon screening. Yes Dr Hyacinth MeekerMiller, Orthopaedic 11/13/15

## 2015-11-20 ENCOUNTER — Inpatient Hospital Stay: Admit: 2015-11-20 | Payer: PRIVATE HEALTH INSURANCE | Primary: Family Medicine

## 2015-11-20 ENCOUNTER — Telehealth

## 2015-11-20 DIAGNOSIS — J069 Acute upper respiratory infection, unspecified: Secondary | ICD-10-CM

## 2015-11-20 DIAGNOSIS — I2699 Other pulmonary embolism without acute cor pulmonale: Secondary | ICD-10-CM

## 2015-11-20 LAB — EKG, 12 LEAD, INITIAL
Atrial Rate: 71 {beats}/min
Calculated P Axis: 48 degrees
Calculated R Axis: 0 degrees
Calculated T Axis: 23 degrees
Diagnosis: NORMAL
P-R Interval: 146 ms
Q-T Interval: 432 ms
QRS Duration: 72 ms
QTC Calculation (Bezet): 469 ms
Ventricular Rate: 71 {beats}/min

## 2015-11-20 MED ORDER — LYRICA 50 MG CAPSULE
50 mg | ORAL_CAPSULE | ORAL | 0 refills | Status: DC
Start: 2015-11-20 — End: 2016-01-11

## 2015-11-20 NOTE — Telephone Encounter (Signed)
Pt request referral to cardiologist     She said she had OV with Dr Delford FieldWright 11/19/2015 and EKG was ordered and done today 11/20/2015    She also said she has a blood clot behind her left lung and her and her family would like her to see a specialist     [place] [address] [address] [employer] [place]medicare & medicaid)

## 2015-11-20 NOTE — Telephone Encounter (Signed)
Cynthia Marsh called in and stated that she would like to have a referral placed to see Dr. Bea GraffZuniega for a blood clot that is in her lungs. Please advise.

## 2015-11-20 NOTE — Telephone Encounter (Signed)
Patient is aware she will be referred to pulmonary and patient is also aware EKG negative and would like to be referred to Cardiology.

## 2015-11-20 NOTE — Telephone Encounter (Signed)
Please advise patient that no pneumonia on CXR, she has some possible scarring and decreased flow but nothing worrisome.  EKG was also negative.    Referral is in

## 2015-11-20 NOTE — Telephone Encounter (Signed)
Patient is aware prescription is ready for pick up.

## 2015-11-20 NOTE — Telephone Encounter (Signed)
Patient is aware new inhaler was sent to the pharmacy.  Patient states she has already picked it up.

## 2015-11-22 NOTE — Telephone Encounter (Signed)
Not sure why cardiology consult is needed. Since you saw her recently I will defer to you

## 2015-11-23 NOTE — Telephone Encounter (Signed)
Called patient and she reports she is requesting cardiology referral to have her heart checked.  I advised her of negative EKG but per her request she would like to have her heart checked by the specialist.

## 2015-11-24 NOTE — Telephone Encounter (Signed)
Patient is aware of the following appointments:    Dr. Junius Finnerhough Cardiology [phone]  November 25, 2015 at 11:00 am  [address]    Dr. Neale BurlyFreeman Pulmonary [phone]  December 14, 2014 at 2:30pm   [address]    Patient is aware to bring insurance card, picture ID and list of Medication. Also patient is aware Pulmonary will send packet of information if she receives information please fill it out and take with her to appointment. If she does not receive information before appointment she will need to come 30 min early.  Patient is aware to call and confirm both appointments.

## 2015-11-25 ENCOUNTER — Encounter: Attending: Cardiovascular Disease | Primary: Family Medicine

## 2015-11-25 ENCOUNTER — Inpatient Hospital Stay: Admit: 2015-11-25 | Payer: PRIVATE HEALTH INSURANCE | Attending: Physician Assistant | Primary: Family Medicine

## 2015-11-25 ENCOUNTER — Encounter

## 2015-11-25 DIAGNOSIS — S83206A Unspecified tear of unspecified meniscus, current injury, right knee, initial encounter: Secondary | ICD-10-CM

## 2015-11-25 MED ORDER — CYCLOBENZAPRINE 10 MG TAB
10 mg | ORAL_TABLET | Freq: Three times a day (TID) | ORAL | 0 refills | Status: DC | PRN
Start: 2015-11-25 — End: 2016-01-11

## 2015-11-25 NOTE — Telephone Encounter (Signed)
Last Visit: 09/07/2015 with MD Wilford CornerArora    Next Appointment: no f/u instructions  Previous Refill Encounters: 09/07/2015 per MD Wilford CornerArora #[phone]     Requested Prescriptions     Pending Prescriptions Disp Refills   ??? cyclobenzaprine (FLEXERIL) 10 mg tablet 90 Tab 0     Sig: Take 0.5-1 Tabs by mouth three (3) times daily as needed for Muscle Spasm(s).

## 2015-11-26 ENCOUNTER — Inpatient Hospital Stay: Admit: 2015-11-26 | Payer: PRIVATE HEALTH INSURANCE | Attending: Internal Medicine | Primary: Family Medicine

## 2015-11-26 DIAGNOSIS — Z1231 Encounter for screening mammogram for malignant neoplasm of breast: Secondary | ICD-10-CM

## 2015-11-26 NOTE — Progress Notes (Signed)
Orders placed

## 2015-11-26 NOTE — Telephone Encounter (Signed)
Patient is aware of the following appointments:    Dr. Neale BurlyFreeman Pulmonary [phone]  Dec 15, 2015 at 2:30 pm check in at 2:15 pm  [address] [facility]    Dr. Junius Finnerhough [phone] Cardiology  November 25, 2015 at 11:00 am - patient states she will have to reschedule this appointment.

## 2015-11-27 ENCOUNTER — Encounter: Primary: Family Medicine

## 2015-12-01 ENCOUNTER — Ambulatory Visit
Admit: 2015-12-01 | Discharge: 2015-12-01 | Payer: MEDICARE | Attending: Cardiovascular Disease | Primary: Family Medicine

## 2015-12-01 DIAGNOSIS — I2699 Other pulmonary embolism without acute cor pulmonale: Secondary | ICD-10-CM

## 2015-12-01 MED ORDER — RIVAROXABAN 20 MG TAB
20 mg | ORAL_TABLET | Freq: Every day | ORAL | 4 refills | Status: DC
Start: 2015-12-01 — End: 2016-01-11

## 2015-12-01 NOTE — Progress Notes (Signed)
HISTORY OF PRESENT ILLNESS  Cynthia Marsh is a 64 y.o. female.    HPI    Patient presents for a new office visit.  Patient was referred here for evaluation of chest pain.  She was diagnosed with a pulmonary embolus at the beginning of December 2016 and started on oral anticoagulation with Xarelto.  Patient has remained on oral anticoagulation for the past 3 weeks.  She states her chest pain has significantly improved over the past few weeks.  She denies any new shortness of breath, no orthopnea, PND, or leg swelling.  She underwent an echocardiogram earlier this month which was essentially normal.  She does not have a previous cardiac history.  She does have a past medical history significant for hypertension, dyslipidemia, and asthma.    She reports that she has not been very active because of chronic right knee arthritic type pains.  She has not had a previous blood clot, nor does she have a family history for blood clots.    Past Medical History   Diagnosis Date   ??? Anxiety    ??? Asthma    ??? Cardiac echocardiogram 11/07/2015     EF 60%.  No RWMA.  Gr 1 DDfx.  RVSP 30 mmHg.     ??? Cardiovascular lower extremity venous duplex 11/06/2015     No DVT bilaterally.   ??? Chronic kidney disease    ??? Depression    ??? Diabetes (HCC)    ??? Hypertension    ??? Insomnia    ??? Osteoporosis    ??? Thromboembolus [facility](HCC)      Current Outpatient Prescriptions   Medication Sig Dispense Refill   ??? cyclobenzaprine (FLEXERIL) 10 mg tablet Take 0.5-1 Tabs by mouth three (3) times daily as needed for Muscle Spasm(s). 90 Tab 0   ??? LYRICA 50 mg capsule TAKE ONE CAPSULE BY MOUTH THREE TIMES DAILY. MAX DAILY AMOUNT: 150MG  90 Cap 0   ??? chlorpheniramine-HYDROcodone (TUSSIONEX) 10-8 mg/5 mL suspension Take 5 mL by mouth every twelve (12) hours as needed for Cough. Max Daily Amount: 10 mL. 115 mL 0   ??? montelukast (SINGULAIR) 10 mg tablet TAKE 1 TABLET BY MOUTH DAILY 90 Tab 0   ??? budesonide (PULMICORT) 180 mcg/actuation aepb inhaler Take 2 Puffs by  inhalation two (2) times a day. 1 Inhaler 5   ??? oxyCODONE-acetaminophen (PERCOCET 10) 10-325 mg per tablet Take 1 Tab by mouth every six (6) hours as needed for Pain. Max Daily Amount: 4 Tabs. 90 Tab 0   ??? rivaroxaban (XARELTO) 15 mg (42)- 20 mg (9) DsPk Take as directed.  Take with food, at approximately the same time each day. 1 Dose Pack 0   ??? HYDROmorphone (DILAUDID) 2 mg tablet Take 1 Tab by mouth every four (4) hours as needed for Pain. Max Daily Amount: 12 mg. 20 Tab 0   ??? albuterol (PROVENTIL HFA, VENTOLIN HFA, PROAIR HFA) 90 mcg/actuation inhaler Take 2 Puffs by inhalation every six (6) hours as needed for Shortness of Breath. 1 Inhaler 5   ??? amLODIPine (NORVASC) 10 mg tablet TAKE 1 TABLET BY MOUTH DAILY 90 Tab 0   ??? ALPRAZolam (XANAX) 0.5 mg tablet TAKE 1 TABLET BY MOUTH TWICE DAILY. MAX DAILY AMOUNT: 1MG  60 Tab 0   ??? ramipril (ALTACE) 5 mg capsule Take 1 Cap by mouth daily. 30 Cap 0   ??? fexofenadine (ALLEGRA) 180 mg tablet Take 180 mg by mouth daily.     ??? EPINEPHrine (EPIPEN) 0.3  mg/0.3 mL injection 0.3 mg by IntraMUSCular route once as needed for Allergic Response.     ??? azelastine (ASTELIN) 137 mcg (0.1 %) nasal spray 2 Sprays by Both Nostrils route two (2) times a day. 1 Bottle 4   ??? pravastatin (PRAVACHOL) 40 mg tablet Take 1 Tab by mouth nightly. 90 Tab 3   ??? oxybutynin chloride XL (DITROPAN XL) 10 mg CR tablet Take 1 Tab by mouth daily. 90 Tab 4   ??? Lancets misc by Does Not Apply route.     ??? topiramate (TOPAMAX) 25 mg tablet 1 tab daily for 1 week then BID 60 Tab 5   ??? mometasone (NASONEX) 50 mcg/actuation nasal spray 2 Sprays by Both Nostrils route daily. 1 Container 5   ??? FREESTYLE LITE STRIPS strip USE TO CHECK BLOOD SUGAR TWICE DAILY 150 Strip 5   ??? FREESTYLE LANCETS 28 gauge misc CHECK BLOOD SUGAR TWICE DAILY 300 Each 5   ??? metFORMIN (GLUCOPHAGE) 1,000 mg tablet TAKE 1 TABLET BY MOUTH TWICE DAILY WITH MEALS 180 Tab 3   ??? meclizine (ANTIVERT) 25 mg tablet Take 1 Tab by mouth three (3) times  daily as needed. Indications: VERTIGO 10 Tab 0   ??? mirtazapine (REMERON) 30 mg tablet Take 30 mg by mouth nightly.     ??? calcium citrate-vitamin d3 (CITRACAL+D) 315-200 mg-unit tab Take 1 Tab by mouth daily (with breakfast).     ??? busPIRone (BUSPAR) 15 mg tablet Take 15 mg by mouth three (3) times daily.  4   ??? zolpidem (AMBIEN) 10 mg tablet Take 10 mg by mouth nightly.  3   ??? FLUoxetine (PROZAC) 40 mg capsule Take 40 mg by mouth daily.  4   ??? hydrochlorothiazide (HYDRODIURIL) 25 mg tablet Take 25 mg by mouth daily.  4     Allergies   Allergen Reactions   ??? 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   ??? Almond Other (comments)     Tongue itches   ??? Carvedilol Other (comments)   ??? Cozaar [Losartan] Cough   ??? Crestor [Rosuvastatin] Unknown (comments)     Heart beats fast      Social History   Substance Use Topics   ??? Smoking status: Former Smoker   ??? Smokeless tobacco: Never Used   ??? Alcohol use Yes      Comment: on occasion     Family History   Problem Relation Age of Onset   ??? Diabetes Mother    ??? Diabetes Brother        Review of Systems   Constitutional: Negative for chills, fever and weight loss.   HENT: Negative for nosebleeds.    Eyes: Negative for blurred vision and double vision.   Respiratory: Negative for cough, shortness of breath and wheezing.    Cardiovascular: Negative for chest pain, palpitations, orthopnea, claudication, leg swelling and PND.   Gastrointestinal: Negative for abdominal pain, heartburn, nausea and vomiting.   Genitourinary: Negative for dysuria and hematuria.   Musculoskeletal: Negative for falls and myalgias.   Skin: Negative for rash.   Neurological: Negative for dizziness, focal weakness and headaches.   Endo/Heme/Allergies: Does not bruise/bleed easily.   Psychiatric/Behavioral: Negative for substance abuse.     Visit Vitals   ??? BP 100/80   ??? Pulse 86   ??? Wt 79.4 kg (175 lb)   ??? SpO2 97%   ??? BMI 30.04 kg/m2       Physical Exam  Constitutional: She is oriented to person, place, and time. She appears well-developed and well-nourished.   HENT:   Head: Normocephalic and atraumatic.   Eyes: Conjunctivae are normal.   Neck: Neck supple. No JVD present. Carotid bruit is not present.   Cardiovascular: Normal rate, regular rhythm, S1 normal, S2 normal and normal pulses.  Exam reveals no gallop.    No murmur heard.  Pulmonary/Chest: Effort normal and breath sounds normal. She has no wheezes. She has no rales.   Abdominal: Soft. Bowel sounds are normal. There is no tenderness.   Musculoskeletal: She exhibits no edema, tenderness or deformity.   Neurological: She is alert and oriented to person, place, and time.   Skin: Skin is warm and dry. No rash noted. No erythema.   Psychiatric: She has a normal mood and affect. Her behavior is normal. Thought content normal.     EKG: November 19, 2014.  Normal sinus rhythm, normal axis, normal QTc interval, no ST or T-wave abnormalities.  Normal tracing.  Compared to the previous EKG from 2 weeks prior, nonspecific T-wave changes have resolved.    ASSESSMENT and PLAN    Pulmonary embolus.  Patient was diagnosed with a unilateral PE involving the left lower lobe of her lung on CT scan in December 2016.  She also underwent a venous duplex scan which was negative for a DVT.  Patient was initially started on subcutaneous Lovenox and was transitioned to oral Xarelto for anticoagulation.  Patient underwent an echocardiogram during her hospital stay earlier this month which was normal.  I would recommend a minimum of 6 months anticoagulation.    Chest pain.  This was likely from her pulmonary embolus since it has improved significantly since starting on oral anticoagulation.  Her EKG changes were likely from her PE which have now normalized.  No further cardiac workup needed.    Hypertension.  Patient's blood pressure appears well-controlled on her current medical regimen, which I would continue.     Dyslipidemia.  Patient is managed with pravastatin.  This is followed by her PCP.     Diabetes mellitus, type II.  This is also followed by her PCP.  Her goal A1c should be less than 7.    Patient can follow-up as needed.

## 2015-12-01 NOTE — Telephone Encounter (Signed)
Pt called in requesting refill of her   Requested Prescriptions     Pending Prescriptions Disp Refills   ??? rivaroxaban (XARELTO) 15 mg (42)- 20 mg (9) DsPk 1 Dose Pack 0     Sig: Take as directed.  Take with food, at approximately the same time each day.   .Marland Kitchen

## 2015-12-01 NOTE — Telephone Encounter (Signed)
Requested Prescriptions     Pending Prescriptions Disp Refills   ??? rivaroxaban (XARELTO) 15 mg (42)- 20 mg (9) DsPk 1 Dose Pack 0     Sig: Take as directed.  Take with food, at approximately the same time each day.       Last office visit was  11/19/15 wright  Next office visit is     12/10/15    Please assist.

## 2015-12-01 NOTE — Telephone Encounter (Signed)
Script sent in

## 2015-12-03 ENCOUNTER — Emergency Department: Admit: 2015-12-03 | Payer: PRIVATE HEALTH INSURANCE | Primary: Family Medicine

## 2015-12-03 ENCOUNTER — Inpatient Hospital Stay
Admit: 2015-12-03 | Discharge: 2015-12-05 | Disposition: A | Discharge status: Home / Self Care | Payer: PRIVATE HEALTH INSURANCE | Attending: Emergency Medicine

## 2015-12-03 DIAGNOSIS — G92 Toxic encephalopathy: Secondary | ICD-10-CM

## 2015-12-03 LAB — CBC WITH AUTOMATED DIFF
ABS. BASOPHILS: 0 10*3/uL (ref 0.0–0.06)
ABS. EOSINOPHILS: 0.1 10*3/uL (ref 0.0–0.4)
ABS. LYMPHOCYTES: 2.1 10*3/uL (ref 0.9–3.6)
ABS. MONOCYTES: 0.6 10*3/uL (ref 0.05–1.2)
ABS. NEUTROPHILS: 4.9 10*3/uL (ref 1.8–8.0)
BASOPHILS: 0 % (ref 0–2)
EOSINOPHILS: 2 % (ref 0–5)
HCT: 37.3 % (ref 35.0–45.0)
HGB: 11.4 g/dL — ABNORMAL LOW (ref 12.0–16.0)
LYMPHOCYTES: 27 % (ref 21–52)
MCH: 26.1 PG (ref 24.0–34.0)
MCHC: 30.6 g/dL — ABNORMAL LOW (ref 31.0–37.0)
MCV: 85.4 FL (ref 74.0–97.0)
MONOCYTES: 8 % (ref 3–10)
MPV: 9.9 FL (ref 9.2–11.8)
NEUTROPHILS: 63 % (ref 40–73)
PLATELET: 282 10*3/uL (ref 135–420)
RBC: 4.37 M/uL (ref 4.20–5.30)
RDW: 14.9 % — ABNORMAL HIGH (ref 11.6–14.5)
WBC: 7.8 10*3/uL (ref 4.6–13.2)

## 2015-12-03 LAB — METABOLIC PANEL, BASIC
Anion gap: 11 mmol/L (ref 3.0–18)
BUN/Creatinine ratio: 22 — ABNORMAL HIGH (ref 12–20)
BUN: 14 MG/DL (ref 7.0–18)
CO2: 29 mmol/L (ref 21–32)
Calcium: 9.9 MG/DL (ref 8.5–10.1)
Chloride: 102 mmol/L (ref 100–108)
Creatinine: 0.63 MG/DL (ref 0.6–1.3)
GFR est AA: >60 mL/min/{1.73_m2} (ref >60)
GFR est non-AA: >60 mL/min/{1.73_m2} (ref >60)
Glucose: 89 mg/dL (ref 74–99)
Potassium: 3.3 mmol/L — ABNORMAL LOW (ref 3.5–5.5)
Sodium: 142 mmol/L (ref 136–145)

## 2015-12-03 LAB — URINE MICROSCOPIC ONLY
RBC: 4 /hpf (ref 0–5)
WBC: 4 /hpf (ref 0–4)

## 2015-12-03 LAB — URINALYSIS W/ RFLX MICROSCOPIC
Bilirubin: NEGATIVE
Glucose: NEGATIVE mg/dL
Ketone: NEGATIVE mg/dL
Nitrites: NEGATIVE
Protein: NEGATIVE mg/dL
Specific gravity: 1.017 (ref 1.005–1.030)
Urobilinogen: 0.2 EU/dL (ref 0.2–1.0)
pH (UA): 7 (ref 5.0–8.0)

## 2015-12-03 LAB — PROTHROMBIN TIME + INR
INR: 1.4 — ABNORMAL HIGH (ref 0.8–1.2)
Prothrombin time: 16.7 s — ABNORMAL HIGH (ref 11.5–15.2)

## 2015-12-03 LAB — PTT: aPTT: 36.7 s — ABNORMAL HIGH (ref 23.0–36.4)

## 2015-12-03 NOTE — ED Provider Notes (Addendum)
[place]  [facility]      64 y.o. female with a PMH of Asthma, CKD, DM, HTN, and PE currently on Xarelto presents to the ED c/o congestion and generalized body aches.  Pt states symptoms started 3 days ago.  She notes having pain in her left flank.  Reports also having a dry cough for the past few days.  As an aside, patient states she has been confused today, thinking she had cooked food when she hadn't.  Also was unable to tie her shoes and get her clothes on.  She also was not walking straight, leaning and falling to the left.  Pt thinks she may have had a stroke.  Notes her last "normal" was the night of the 27th.  Denies fever, chills, chest pain, SOB, numbness, or other symptoms.     No current facility-administered medications for this encounter.      Current Outpatient Prescriptions   Medication Sig   ??? rivaroxaban (XARELTO) 20 mg tab tablet Take 1 Tab by mouth daily.   ??? cyclobenzaprine (FLEXERIL) 10 mg tablet Take 0.5-1 Tabs by mouth three (3) times daily as needed for Muscle Spasm(s).   ??? LYRICA 50 mg capsule TAKE ONE CAPSULE BY MOUTH THREE TIMES DAILY. MAX DAILY AMOUNT: 150MG    ??? montelukast (SINGULAIR) 10 mg tablet TAKE 1 TABLET BY MOUTH DAILY   ??? budesonide (PULMICORT) 180 mcg/actuation aepb inhaler Take 2 Puffs by inhalation two (2) times a day.   ??? albuterol (PROVENTIL HFA, VENTOLIN HFA, PROAIR HFA) 90 mcg/actuation inhaler Take 2 Puffs by inhalation every six (6) hours as needed for Shortness of Breath.   ??? amLODIPine (NORVASC) 10 mg tablet TAKE 1 TABLET BY MOUTH DAILY   ??? ALPRAZolam (XANAX) 0.5 mg tablet TAKE 1 TABLET BY MOUTH TWICE DAILY. MAX DAILY AMOUNT: 1MG    ??? ramipril (ALTACE) 5 mg capsule Take 1 Cap by mouth daily.   ??? fexofenadine (ALLEGRA) 180 mg tablet Take 180 mg by mouth daily.   ??? azelastine (ASTELIN) 137 mcg (0.1 %) nasal spray 2 Sprays by Both Nostrils route two (2) times a day.   ??? pravastatin (PRAVACHOL) 40 mg tablet Take 1 Tab by mouth nightly.    ??? oxybutynin chloride XL (DITROPAN XL) 10 mg CR tablet Take 1 Tab by mouth daily.   ??? mometasone (NASONEX) 50 mcg/actuation nasal spray 2 Sprays by Both Nostrils route daily.   ??? metFORMIN (GLUCOPHAGE) 1,000 mg tablet TAKE 1 TABLET BY MOUTH TWICE DAILY WITH MEALS   ??? meclizine (ANTIVERT) 25 mg tablet Take 1 Tab by mouth three (3) times daily as needed. Indications: VERTIGO   ??? mirtazapine (REMERON) 30 mg tablet Take 30 mg by mouth nightly.   ??? calcium citrate-vitamin d3 (CITRACAL+D) 315-200 mg-unit tab Take 1 Tab by mouth daily (with breakfast).   ??? busPIRone (BUSPAR) 15 mg tablet Take 15 mg by mouth three (3) times daily.   ??? zolpidem (AMBIEN) 10 mg tablet Take 10 mg by mouth nightly.   ??? FLUoxetine (PROZAC) 40 mg capsule Take 40 mg by mouth daily.   ??? hydrochlorothiazide (HYDRODIURIL) 25 mg tablet Take 25 mg by mouth daily.   ??? chlorpheniramine-HYDROcodone (TUSSIONEX) 10-8 mg/5 mL suspension Take 5 mL by mouth every twelve (12) hours as needed for Cough. Max Daily Amount: 10 mL.   ??? oxyCODONE-acetaminophen (PERCOCET 10) 10-325 mg per tablet Take 1 Tab by mouth every six (6) hours as needed for Pain. Max Daily Amount: 4 Tabs.   ???  HYDROmorphone (DILAUDID) 2 mg tablet Take 1 Tab by mouth every four (4) hours as needed for Pain. Max Daily Amount: 12 mg.   ??? EPINEPHrine (EPIPEN) 0.3 mg/0.3 mL injection 0.3 mg by IntraMUSCular route once as needed for Allergic Response.   ??? Lancets misc by Does Not Apply route.   ??? topiramate (TOPAMAX) 25 mg tablet 1 tab daily for 1 week then BID   ??? FREESTYLE LITE STRIPS strip USE TO CHECK BLOOD SUGAR TWICE DAILY   ??? FREESTYLE LANCETS 28 gauge misc CHECK BLOOD SUGAR TWICE DAILY       Past Medical History   Diagnosis Date   ??? Anxiety    ??? Asthma    ??? Cardiac echocardiogram 11/07/2015     EF 60%.  No RWMA.  Gr 1 DDfx.  RVSP 30 mmHg.     ??? Cardiovascular lower extremity venous duplex 11/06/2015     No DVT bilaterally.   ??? Chronic kidney disease    ??? Depression    ??? Diabetes (HCC)     ??? Hypertension    ??? Insomnia    ??? Osteoporosis    ??? Thromboembolus [facility])        Past Surgical History   Procedure Laterality Date   ??? Pr anesth,surgery of shoulder     ??? Pr hand/finger surgery unlisted     ??? Pr laminectomy,cervical     ??? Hx cyst removal       calf    ??? Hx gyn       complete hyst    ??? Hx gyn       tubal ligation   ??? Hx wrist fracture tx     ??? Hx back surgery  2012     L4/L5/S1 fused in [place]   ??? Hx orthopaedic       Right shoulder rotator cuff    ??? Hx orthopaedic       Left Little toe hammer toe, left big toe bunion removed   ??? Hx orthopaedic       Right knee torn meniscus   ??? Hx orthopaedic       CTS bilateral , Left thumb trigger finger    ??? Pr breast surgery procedure unlisted        Left cyst removed    ??? Hx cyst incision and drainage Left    ??? Hx hysterectomy     ??? Hx oophorectomy         Family History   Problem Relation Age of Onset   ??? Diabetes Mother    ??? Diabetes Brother        Social History     Social History   ??? Marital status: SINGLE     Spouse name: N/A   ??? Number of children: N/A   ??? Years of education: N/A     Occupational History   ??? Not on file.     Social History Main Topics   ??? Smoking status: Former Smoker   ??? Smokeless tobacco: Never Used   ??? Alcohol use Yes      Comment: on occasion   ??? Drug use: No   ??? Sexual activity: Not on file     Other Topics Concern   ??? Not on file     Social History Narrative       Allergies   Allergen Reactions   ??? 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   ??? Barron Schmid  Other (comments)     Tongue itches   ??? Carvedilol Other (comments)   ??? Cozaar [Losartan] Cough   ??? Crestor [Rosuvastatin] Unknown (comments)     Heart beats fast       Patient's primary care provider (as noted in EPIC):  Marvia Pickles, MD    REVIEW OF SYSTEMS:    Constitutional:  Negative for fever, diaphoresis.  HENT:  + for congestion.    Respiratory:  + cough.  Negative for shortness of breath.    Cardiovascular:  Negative for chest pain and palpitations.    Gastrointestinal:  Negative for diarrhea.  Genitourinary:  Negative for flank pain.   Musculoskeletal:  Negative for back pain.   Skin:  Negative for pallor.   Neurological:  + dizziness.  Negative for focal numbness, weakness or tingling.  Negative for slurred speech.  Psych: + confusion.   All other systems negative as reviewed.     Visit Vitals   ??? BP 107/77   ??? Pulse 82   ??? Temp 98.3 ??F (36.8 ??C)   ??? Resp 20   ??? Ht  (1.626 m)   ??? Wt 79.4 kg (175 lb)   ??? SpO2 97%   ??? BMI 30.04 kg/m2       PHYSICAL EXAM:    CONSTITUTIONAL:  Alert, well developed;  well nourished.  HEAD:  Normocephalic, atraumatic.  EYES:  EOMI.  Non-icteric sclera.  Normal conjunctiva.  ENTM:  Nose:  no rhinorrhea.  Throat:  no erythema or exudate, mucous membranes moist.  NECK:  Supple  RESPIRATORY:  Chest clear, equal breath sounds, good air movement.  CARDIOVASCULAR:  Regular rate and rhythm.  No murmurs, rubs, or gallops.  GI:  Normal bowel sounds, abdomen soft and non-tender.  No rebound or guarding.  BACK:  Non-tender.  UPPER EXT:  Normal inspection.  LOWER EXT:  No edema, no calf tenderness.  Distal pulses intact.  NEURO:  Patient leans to left and nearly falls to left when attempting to walk.  Moves all four extremities.  Normal motor exam and sensation in all four extremities.  Normal CN II-XII exam.  Finger-to-nose is ataxic.  Pt unable to perform heel-to-shin.   SKIN:  No rashes;  Normal for age.  PSYCH:  Alert and normal affect.    DIFFERENTIAL DIAGNOSES/ MEDICAL DECISION MAKING:   Dehydration, hyperglycemia-induced weakness, electrolyte and/or endocrine imbalance, CVA, intracranial hemorrhage, sepsis, cardiac arrhythmia, central versus peripheral vertigo, illicit drug intoxication, alcohol intoxication, prescribed drug toxicity, pregnancy in females patients, anxiety disorder, versus other etiologies or a combination of the above.      ED COURSE:      Abnormal lab results from this emergency department encounter:  Labs Reviewed    PROTHROMBIN TIME + INR - Abnormal; Notable for the following:        Result Value    Prothrombin time 16.7 (*)     INR 1.4 (*)     All other components within normal limits   PTT - Abnormal; Notable for the following:     aPTT 36.7 (*)     All other components within normal limits   CBC WITH AUTOMATED DIFF - Abnormal; Notable for the following:     HGB 11.4 (*)     MCHC 30.6 (*)     RDW 14.9 (*)     All other components within normal limits   METABOLIC PANEL, BASIC - Abnormal; Notable for the following:     Potassium 3.3 (*)  BUN/Creatinine ratio 22 (*)     All other components within normal limits       Lab values for this patient within approximately the last 12 hours:  Recent Results (from the past 12 hour(s))   EKG, 12 LEAD, INITIAL    Collection Time: 12/03/15 12:57 PM   Result Value Ref Range    Ventricular Rate 84 BPM    Atrial Rate 84 BPM    P-R Interval 156 ms    QRS Duration 88 ms    Q-T Interval 380 ms    QTC Calculation (Bezet) 449 ms    Calculated P Axis 53 degrees    Calculated R Axis -2 degrees    Calculated T Axis 26 degrees    Diagnosis       Sinus rhythm with premature atrial complexes  Otherwise normal ECG  When compared with ECG of 20-Nov-2015 11:56,  premature atrial complexes are now present     PROTHROMBIN TIME + INR    Collection Time: 12/03/15  2:10 PM   Result Value Ref Range    Prothrombin time 16.7 (H) 11.5 - 15.2 sec    INR 1.4 (H) 0.8 - 1.2     PTT    Collection Time: 12/03/15  2:10 PM   Result Value Ref Range    aPTT 36.7 (H) 23.0 - 36.4 SEC   CBC WITH AUTOMATED DIFF    Collection Time: 12/03/15  2:10 PM   Result Value Ref Range    WBC 7.8 4.6 - 13.2 K/uL    RBC 4.37 4.20 - 5.30 M/uL    HGB 11.4 (L) 12.0 - 16.0 g/dL    HCT 96.0 45.4 - 09.8 %    MCV 85.4 74.0 - 97.0 FL    MCH 26.1 24.0 - 34.0 PG    MCHC 30.6 (L) 31.0 - 37.0 g/dL    RDW 11.9 (H) 14.7 - 14.5 %    PLATELET 282 135 - 420 K/uL    MPV 9.9 9.2 - 11.8 FL    NEUTROPHILS 63 40 - 73 %    LYMPHOCYTES 27 21 - 52 %     MONOCYTES 8 3 - 10 %    EOSINOPHILS 2 0 - 5 %    BASOPHILS 0 0 - 2 %    ABS. NEUTROPHILS 4.9 1.8 - 8.0 K/UL    ABS. LYMPHOCYTES 2.1 0.9 - 3.6 K/UL    ABS. MONOCYTES 0.6 0.05 - 1.2 K/UL    ABS. EOSINOPHILS 0.1 0.0 - 0.4 K/UL    ABS. BASOPHILS 0.0 0.0 - 0.06 K/UL    DF AUTOMATED     METABOLIC PANEL, BASIC    Collection Time: 12/03/15  2:10 PM   Result Value Ref Range    Sodium 142 136 - 145 mmol/L    Potassium 3.3 (L) 3.5 - 5.5 mmol/L    Chloride 102 100 - 108 mmol/L    CO2 29 21 - 32 mmol/L    Anion gap 11 3.0 - 18 mmol/L    Glucose 89 74 - 99 mg/dL    BUN 14 7.0 - 18 MG/DL    Creatinine 8.29 0.6 - 1.3 MG/DL    BUN/Creatinine ratio 22 (H) 12 - 20      GFR est AA >60 >60 ml/min/1.81m2    GFR est non-AA >60 >60 ml/min/1.70m2    Calcium 9.9 8.5 - 10.1 MG/DL       Radiologist and cardiologist interpretations if available at time of this  note:  XR CHEST AP LAT   Final Result      CT HEAD WO CONT    (Results Pending)     CXR IMPRESSION:   1. Bilateral subsegmental atelectasis particularly in the left upper lobe.  2. No definite pleural effusion although the posterior costophrenic angles are  slightly ill defined.      CT BRAIN IMPRESSION:??  The brain looks normal for age.  ??  EKG: Sinus rhythm rate of 84bpm, PAC's, no STEMI.     IMPRESSION AND MEDICAL DECISION MAKING:  Pt having confusion, difficulty dressing/putting on shoes, and falls to the left with walking. Neuro examination: finger-to-nose, heel-to-shin, and gait abnormal.  CT unremarkable.  Plan for MRI.     7:05 PM Dr. Leonides Grills has seen and evaluated the patient.  She was able to walk normally at this time, but is still ataxic in her finger-to-nose movements.  He recommends get teleneurology involved and obtain ETOH and UDS.     Consult 7:06 PM:   I have discussed case with Dr. Manson Passey.  Standard discussion regarding this patient's presenting symptoms, PMHX, exam, vital signs, available ancillary and diagnostic studies.  Consulting MD states: he agrees to take  over care of the patient at this time.      Nicolette Bang, PA

## 2015-12-03 NOTE — ED Notes (Signed)
Pt resting comfortably in bed, on monitor. Call bell in reach

## 2015-12-03 NOTE — Progress Notes (Signed)
Vascular study completed.

## 2015-12-03 NOTE — ED Notes (Signed)
Pt now sts that she felt tired yesterday and stayed in bed most of the day, was forgetful, and had trouble ambulating. She had an episode of dizziness this morning, per friend at bedside it was around 11, and had difficulty walking. Also with c/o generalized pain since Tuesday. Got pt out of bed, pt noted to be weak on the left side and unable to ambulate. Pt alert to person and place, not sure of what day it was and stated that it was 2017. Left arm noted to be weaker than right, speech slightly slurred, pt sts drowsiness but able to stay awake and carry on conversation. Follows all commands.

## 2015-12-03 NOTE — ED Notes (Signed)
Pt ambulated without diff to BR assisted x one meal tray prepared for pt

## 2015-12-03 NOTE — ED Notes (Signed)
Vitals:  Patient Vitals for the past 12 hrs:   Temp Pulse Resp BP SpO2   12/03/15 2200 - 79 16 101/66 92 %   12/03/15 2130 - 76 16 96/73 96 %   12/03/15 2100 - 80 18 103/75 95 %   12/03/15 2030 - 83 17 103/77 96 %   12/03/15 2000 - 81 18 117/78 92 %   12/03/15 1930 - 77 14 102/74 93 %   12/03/15 1915 - 82 13 103/74 94 %   12/03/15 1905 - 80 17 108/75 91 %   12/03/15 1532 - 85 16 108/79 92 %   12/03/15 1430 - 82 20 107/77 97 %   12/03/15 1403 - 87 14 - 96 %   12/03/15 1401 - - - 111/80 -   12/03/15 1222 98.3 ??F (36.8 ??C) 96 16 101/72 95 %   12/03/15 1218 - - - 101/72 95 %       Medications ordered:   Medications   potassium chloride (K-DUR, KLOR-CON) SR tablet 40 mEq (not administered)           X-Ray, CT or other radiology findings or impressions:  DUPLEX LOWER EXT VENOUS BILAT   No Embolism detected.     Interpreted by Radiology.          Progress notes, Consult notes or additional Procedure notes:   7:00 PM Patient care signed out to me.     11:03 PM Consult:  Discussed care with Dr. Wanda Plumpirghayu Desai, hospitalist. Standard discussion; including history of patient???s chief complaint, available diagnostic results, and treatment course. Agrees to admit patient to telemetry.     11:06 PM Consult:  Discussed care with Dr. Lethea KillingsGebel, Neurology. Standard discussion; including history of patient???s chief complaint, available diagnostic results, and treatment course. Agrees with plans for admission. Will consult pt in the hospital.      Reevaluation of patient:   I have reassessed the patient.  Patient is feeling well. I informed her of plans for admission. She agrees. All questions and concerns were addressed at this time.     Disposition:  Diagnosis: No diagnosis found.    Disposition: admission    Follow-up Information     None           Patient's Medications   Start Taking    No medications on file   Continue Taking    ALBUTEROL (PROVENTIL HFA, VENTOLIN HFA, PROAIR HFA) 90 MCG/ACTUATION  INHALER    Take 2 Puffs by inhalation every six (6) hours as needed for Shortness of Breath.    ALPRAZOLAM (XANAX) 0.5 MG TABLET    TAKE 1 TABLET BY MOUTH TWICE DAILY. MAX DAILY AMOUNT: 1MG     AMLODIPINE (NORVASC) 10 MG TABLET    TAKE 1 TABLET BY MOUTH DAILY    AZELASTINE (ASTELIN) 137 MCG (0.1 %) NASAL SPRAY    2 Sprays by Both Nostrils route two (2) times a day.    BUDESONIDE (PULMICORT) 180 MCG/ACTUATION AEPB INHALER    Take 2 Puffs by inhalation two (2) times a day.    BUSPIRONE (BUSPAR) 15 MG TABLET    Take 15 mg by mouth three (3) times daily.    CALCIUM CITRATE-VITAMIN D3 (CITRACAL+D) 315-200 MG-UNIT TAB    Take 1 Tab by mouth daily (with breakfast).    CHLORPHENIRAMINE-HYDROCODONE (TUSSIONEX) 10-8 MG/5 ML SUSPENSION    Take 5 mL by mouth every twelve (12) hours as needed for Cough. Max Daily Amount: 10 mL.  CYCLOBENZAPRINE (FLEXERIL) 10 MG TABLET    Take 0.5-1 Tabs by mouth three (3) times daily as needed for Muscle Spasm(s).    EPINEPHRINE (EPIPEN) 0.3 MG/0.3 ML INJECTION    0.3 mg by IntraMUSCular route once as needed for Allergic Response.    FEXOFENADINE (ALLEGRA) 180 MG TABLET    Take 180 mg by mouth daily.    FLUOXETINE (PROZAC) 40 MG CAPSULE    Take 40 mg by mouth daily.    FREESTYLE LANCETS 28 GAUGE MISC    CHECK BLOOD SUGAR TWICE DAILY    FREESTYLE LITE STRIPS STRIP    USE TO CHECK BLOOD SUGAR TWICE DAILY    HYDROCHLOROTHIAZIDE (HYDRODIURIL) 25 MG TABLET    Take 25 mg by mouth daily.    HYDROMORPHONE (DILAUDID) 2 MG TABLET    Take 1 Tab by mouth every four (4) hours as needed for Pain. Max Daily Amount: 12 mg.    LANCETS MISC    by Does Not Apply route.    LYRICA 50 MG CAPSULE    TAKE ONE CAPSULE BY MOUTH THREE TIMES DAILY. MAX DAILY AMOUNT:     MECLIZINE (ANTIVERT) 25 MG TABLET    Take 1 Tab by mouth three (3) times daily as needed. Indications: VERTIGO    METFORMIN (GLUCOPHAGE) 1,000 MG TABLET    TAKE 1 TABLET BY MOUTH TWICE DAILY WITH MEALS     MIRTAZAPINE (REMERON) 30 MG TABLET    Take 30 mg by mouth nightly.    MOMETASONE (NASONEX) 50 MCG/ACTUATION NASAL SPRAY    2 Sprays by Both Nostrils route daily.    MONTELUKAST (SINGULAIR) 10 MG TABLET    TAKE 1 TABLET BY MOUTH DAILY    OXYBUTYNIN CHLORIDE XL (DITROPAN XL) 10 MG CR TABLET    Take 1 Tab by mouth daily.    OXYCODONE-ACETAMINOPHEN (PERCOCET 10) 10-325 MG PER TABLET    Take 1 Tab by mouth every six (6) hours as needed for Pain. Max Daily Amount: 4 Tabs.    PRAVASTATIN (PRAVACHOL) 40 MG TABLET    Take 1 Tab by mouth nightly.    RAMIPRIL (ALTACE) 5 MG CAPSULE    Take 1 Cap by mouth daily.    RIVAROXABAN (XARELTO) 20 MG TAB TABLET    Take 1 Tab by mouth daily.    TOPIRAMATE (TOPAMAX) 25 MG TABLET    1 tab daily for 1 week then BID    ZOLPIDEM (AMBIEN) 10 MG TABLET    Take 10 mg by mouth nightly.   These Medications have changed    No medications on file   Stop Taking    No medications on file         Scribe Attestation  I, Maudie Mercury, am scribing for, and in the presence of  Sherren Kerns, MD 11:01 PM, 12/03/15.    Signed by: Maudie Mercury, Scribe, 12/03/15, 11:01 PM    Physician Attestation  I personally performed the services described in the documentation, reviewed the documentation, as recorded by the scribe in my presence, and it accurately and completely records my words and actions.    Sherren Kerns, MD

## 2015-12-03 NOTE — ED Notes (Signed)
Vascular at bedside for Venous Duplex lower extremities.

## 2015-12-03 NOTE — ED Notes (Signed)
Pt resting  Pending  MRI results

## 2015-12-03 NOTE — Other (Signed)
Facility Name: [facility]   ??  ??  ??  ??  ??  ??   Patient Demographics    ?? Patient Name Sex DOB Address Phone   ?? Rosalita LevanJackson, Kaliann Female 04/29/1951 [address]  [place] [id number] [phone] (Home) *Preferred*  [phone] (Mobile)   ??   Hospital Account    ?? Name Acct ID Class Status Primary Coverage   ?? Rosalita LevanJackson, Stephania [id number] INPATIENT Open [place] [employer] [place] [employer]   ??      ??   Guarantor Account (for Hospital Account [id number])    ?? Name Relation to Pt Service Area Active? Acct Type   ?? Rosalita LevanJackson, Anwen Self BONSC Yes Personal/Family   ?? Address Phone         ?? [address]  [place], [place] [id number] [phone](H)     ??      ??   Coverage Information (for Hospital Account [id number])    ?? 1. [place] [employer]/[place] [employer]    ?? F/O Payor/Plan Subscriber DOB Subscriber Sex Precert #   ?? [place] [employer] [employer] 05-02-2051 F    ?? Subscriber Subscriber #   ?? Rosalita LevanJackson, [place] [id number]   ?? Grp # Group Name   ??  [employer]    ?? Address Phone   ?? [address]  [place], [place] [phone]    ?? Policy Number Status Effective Date Benefits Phone   ?? [id number] -  -   ?? Auth/Cert   ??    ??      ?? 2. [place] [employer]/[place] [employer]    ?? F/O Payor/Plan Subscriber DOB Subscriber Sex Precert #   ?? [place] [employer] [employer] 05-02-2051 F    ?? Subscriber Subscriber #   ?? Rosalita LevanJackson, Anadia DUAL   ?? Grp # Group Name   ??     ?? Address Phone   ?? [address]  [place], [place] [phone]    ?? Policy Number Status Effective Date Benefits Phone   ?? DUAL -  -   ?? Auth/Cert   ??    ??      ??      ??   Admission Information - Patient Record Only    ?? Arrival Date/Time: 12/03/2015 1214 Admit Date/Time: 12/03/2015 1216 IP  Adm. Date/Time: 12/03/2015 2309   ?? Admission Type: Emergency Point of Origin: Non-health Care Facility/self Admit Category:    ?? Means of Arrival: Car Primary Service: Medicine Secondary Service: N/A   ?? Transfer Source:  [facility]   ?? Admit Provider:  Attending Provider: Layla MawJames O Carleo, MD Referring Provider:    ??   Admission Information    ?? Attending Provider Admission Dx:R41.82 Admitted On   ??   12/03/15   ?? Service Isolation Code Status   ?? MEDICINE  Prior   ?? Allergies         ?? Nuts [Tree Nut], Almond, Carvedilol, Cozaar [Losartan], Crestor [Rosuvastatin]     ??   Admission Information    ?? Unit/Bed: [facility] EMERGENCY DEPT/06 Service: MEDICINE   ?? Admitting provider:  Phone:    ?? Attending provider:  Phone:    ?? PCP: [name]  Brynda Peon, MD Phone: [phone]   ?? Admission dx: Altered mental status Patient class: I   ?? Admission type: ER     ??

## 2015-12-03 NOTE — ED Notes (Signed)
Bedside and Verbal shift change report given to Joseph P Bonneville, RN (oncoming nurse) by Marie Gibson RN (offgoing nurse). Report included the following information SBAR, Kardex, MAR and Recent Results.

## 2015-12-03 NOTE — ED Notes (Signed)
Pt to ct at this time

## 2015-12-03 NOTE — ED Notes (Signed)
Pt to MRI, checklist done and signed by patient

## 2015-12-03 NOTE — ED Triage Notes (Signed)
Pt with c/o generalized body aches and congestion for over a week, ran out of cough medicine, productive cough with white sputum per pt. Discharged on 12/5 for pulmonary embolism, now taking xarelto. resp even and unlabored.

## 2015-12-03 NOTE — ED Notes (Signed)
Pt back from MRI, sts year is [id number], knows birthday, reoriented to time and place. speech still slurred. Follows all commands, drowsy but arouses easily. Iv sl. No change in pt condition

## 2015-12-03 NOTE — ED Notes (Signed)
Pt  Asking if she can take her on medication PA aware okay with pt taking  meds

## 2015-12-04 LAB — ETHYL ALCOHOL: ALCOHOL(ETHYL),SERUM: <3 MG/DL (ref 0–3)

## 2015-12-04 LAB — DRUG SCREEN, URINE (HBV ONLY)
AMPHETAMINES: NEGATIVE
BARBITURATES: NEGATIVE
BENZODIAZEPINES: POSITIVE — AB
COCAINE: NEGATIVE
Methamphetamines: NEGATIVE
OPIATES: POSITIVE — AB
PCP(PHENCYCLIDINE): NEGATIVE
THC (TH-CANNABINOL): NEGATIVE
TRICYCLICS: POSITIVE — AB

## 2015-12-04 LAB — EKG, 12 LEAD, INITIAL
Atrial Rate: 84 {beats}/min
Calculated P Axis: 53 degrees
Calculated R Axis: -2 degrees
Calculated T Axis: 26 degrees
P-R Interval: 156 ms
Q-T Interval: 380 ms
QRS Duration: 88 ms
QTC Calculation (Bezet): 449 ms
Ventricular Rate: 84 {beats}/min

## 2015-12-04 MED ORDER — POTASSIUM CHLORIDE SR 20 MEQ TAB, PARTICLES/CRYSTALS
20 mEq | Freq: Three times a day (TID) | ORAL | Status: DC
Start: 2015-12-04 — End: 2015-12-04
  Administered 2015-12-04: 04:00:00 via ORAL

## 2015-12-04 MED ORDER — NALOXONE 0.4 MG/ML INJECTION
0.4 mg/mL | Freq: Once | INTRAMUSCULAR | Status: AC
Start: 2015-12-04 — End: 2015-12-03
  Administered 2015-12-04: 04:00:00 via INTRAVENOUS

## 2015-12-04 MED ORDER — ONDANSETRON (PF) 4 MG/2 ML INJECTION
4 mg/2 mL | INTRAMUSCULAR | Status: AC
Start: 2015-12-04 — End: 2015-12-04
  Administered 2015-12-04: 14:00:00 via INTRAVENOUS

## 2015-12-04 MED ORDER — ALPRAZOLAM 0.5 MG TAB
0.5 mg | Freq: Two times a day (BID) | ORAL | Status: DC
Start: 2015-12-04 — End: 2015-12-05
  Administered 2015-12-04 (×2): via ORAL

## 2015-12-04 MED ORDER — POTASSIUM CHLORIDE 20 MEQ ORAL PACKET FOR SOLUTION
20 mEq | Freq: Three times a day (TID) | ORAL | Status: DC
Start: 2015-12-04 — End: 2015-12-05
  Administered 2015-12-04 (×3): via ORAL

## 2015-12-04 MED FILL — ALPRAZOLAM 0.25 MG TAB: 0.25 mg | ORAL | Qty: 2

## 2015-12-04 MED FILL — ONDANSETRON (PF) 4 MG/2 ML INJECTION: 4 mg/2 mL | INTRAMUSCULAR | Qty: 2

## 2015-12-04 MED FILL — NALOXONE 0.4 MG/ML INJECTION: 0.4 mg/mL | INTRAMUSCULAR | Qty: 1

## 2015-12-04 MED FILL — POTASSIUM CHLORIDE 20 MEQ ORAL PACKET FOR SOLUTION: 20 mEq | ORAL | Qty: 2

## 2015-12-04 MED FILL — POTASSIUM CHLORIDE SR 20 MEQ TAB, PARTICLES/CRYSTALS: 20 mEq | ORAL | Qty: 2

## 2015-12-04 NOTE — Procedures (Signed)
[facility]  *** FINAL REPORT ***    Name: Cynthia Marsh, Terrin  MRN: [id number]  DOB: 09 Jul 1951  HIS Order #: [id number]  TRAKnet Visit #: [id number]  Date: 03 Dec 2015    TYPE OF TEST: Peripheral Venous Testing    REASON FOR TEST  Pain in limb, Limb swelling, Limb Pain    Right Leg:-  Deep venous thrombosis:           No  Superficial venous thrombosis:    No  Deep venous insufficiency:        Not examined  Superficial venous insufficiency: Not examined    Left Leg:-  Deep venous thrombosis:           No  Superficial venous thrombosis:    No  Deep venous insufficiency:        Not examined  Superficial venous insufficiency: Not examined      INTERPRETATION/FINDINGS  Duplex images were obtained using 2-D gray scale, color flow and  spectral doppler analysis.  Right leg :  1. No evidence of deep venous thrombosis detected in the veins  visualized.  2. Deep vein(s) visualized include the common femoral, femoral,  popliteal, posterior tibial and peroneal veins.  3. No evidence of superficial thrombosis detected.  4. Superficial vein(s) visualized include the proximal great saphenous   vein.  5. Pulsatile flow detected.  6. Multiphasic flow in the posterior tibial artery.  Left leg :  1. No evidence of deep venous thrombosis detected in the veins  visualized.  2. Deep vein(s) visualized include the common femoral, femoral,  popliteal, posterior tibial and peroneal veins.  3. No evidence of superficial thrombosis detected.  4. Superficial vein(s) visualized include the proximal great saphenous   vein.  5. Pulsatile flow detected.  6. Multiphasic flow in the posterior tibial artery.    ADDITIONAL COMMENTS  No significant change since previous study 27 days ago.    I have personally reviewed the data relevant to the interpretation of  this  study.    TECHNOLOGIST: Denny PeonJennifer Tempesco, RVT  Signed: 12/03/2015 10:20 PM    PHYSICIAN: Livingston DionesJun Raima Geathers MD  Signed: 12/04/2015 08:42 AM

## 2015-12-04 NOTE — ED Notes (Signed)
[facility]

## 2015-12-04 NOTE — ED Notes (Signed)
Pt was reassessed and os taking her medications from home per permission from Dr. Maurine SimmeringWentzel. Home medications chart was updated with current times of administration. Pt has no further complaints. MD notified and will continue to monitor.

## 2015-12-04 NOTE — ED Notes (Signed)
Attempted to call report to 2 s nurse in report unable to received report.

## 2015-12-04 NOTE — Other (Signed)
.  TRANSFER - OUT REPORT:    Verbal report given to Tammy Scott(name) on Cynthia LevanFrankie Marsh  being [facility] for urgent transfer       Report consisted of patient???s Situation, Background, Assessment and   Recommendations(SBAR).     Information from the following report(s) ED Summary was reviewed with the receiving nurse.    Lines:   Peripheral IV 12/03/15 Right Hand (Active)   Site Assessment Clean, dry, & intact 12/03/2015  2:19 PM   Phlebitis Assessment 0 12/03/2015  2:19 PM   Infiltration Assessment 0 12/03/2015  2:19 PM   Dressing Status Clean, dry, & intact 12/03/2015  2:19 PM   Dressing Type 4 X 4 12/03/2015  2:19 PM   Alcohol Cap Used No 12/03/2015  2:19 PM        Opportunity for questions and clarification was provided.      Patient transported with:   Patient???s medications from home

## 2015-12-04 NOTE — ED Notes (Signed)
Pt. Tolerated the medication better after zofran and changing the form of medication. MD notified and will continue to monitor.

## 2015-12-04 NOTE — ED Notes (Signed)
Pt. Reassessed at this time. Pt is stable at this time and notified the RN that she is having her medications brought in and food brought in for herself due to her specific foods she eats. MD notified and will continue to monitor.

## 2015-12-04 NOTE — ED Notes (Signed)
Pt. Was assisted to the restroom and reassessed with no complaints. MD notified and will continue to monitor.

## 2015-12-04 NOTE — ED Triage Notes (Signed)
Bedside and Verbal shift change report given to Bear StearnsJason RN (oncoming nurse) by Delrae RendJoseph P Bonneville, RN (offgoing nurse). Report included the following information SBAR, Kardex, Intake/Output and Recent Results.

## 2015-12-04 NOTE — ED Notes (Signed)
Pt. Was assisted to the restroom at this time and she was updated with bed status. Pt requested some bathing supplies and was given the supplies. She requested to bathe later this evening. Pt was reassessed and has no complaints at this time. MD notified and will continue to monitor.

## 2015-12-04 NOTE — ED Notes (Signed)
Bedside report completed and given to [facility] RN. Pt stable at the time of report.

## 2015-12-04 NOTE — ED Notes (Signed)
Pt. Reassessed at this time, pt assisted to the restroom by the tech. Pt is requesting some breakfast and asking about her regular medications from home. Dr. Maurine SimmeringWentzel was notified and advised she could take her regular home medications and she could eat. Pt was notified and stated she would get her meds from home. Food is being given to the patient. She has no further complaints at this time. Will continue to monitor.

## 2015-12-04 NOTE — ED Notes (Signed)
Pt reassessed and assisted to the Restroom. Pt is stable at this time. No further complaints from the pt. MD notified and will continue to monitor.

## 2015-12-04 NOTE — ED Notes (Signed)
Pt pain free waiting for transport oriented x 4 denies any pain.

## 2015-12-04 NOTE — Procedures (Signed)
[facility]  *** FINAL REPORT ***    Name: Cynthia Marsh, Cynthia Marsh  MRN: [id number]  DOB: 09 Jul 1951  HIS Order #: [id number]  TRAKnet Visit #: [id number]  Date: 03 Dec 2015    TYPE OF TEST: Peripheral Venous Testing    REASON FOR TEST  Pain in limb, Limb swelling, Limb Pain    Right Leg:-  Deep venous thrombosis:           No  Superficial venous thrombosis:    No  Deep venous insufficiency:        Not examined  Superficial venous insufficiency: Not examined    Left Leg:-  Deep venous thrombosis:           No  Superficial venous thrombosis:    No  Deep venous insufficiency:        Not examined  Superficial venous insufficiency: Not examined      INTERPRETATION/FINDINGS  Duplex images were obtained using 2-D gray scale, color flow and  spectral doppler analysis.  Right leg :  1. No evidence of deep venous thrombosis detected in the veins  visualized.  2. Deep vein(s) visualized include the common femoral, femoral,  popliteal, posterior tibial and peroneal veins.  3. No evidence of superficial thrombosis detected.  4. Superficial vein(s) visualized include the proximal great saphenous   vein.  5. Pulsatile flow detected.  6. Multiphasic flow in the posterior tibial artery.  Left leg :  1. No evidence of deep venous thrombosis detected in the veins  visualized.  2. Deep vein(s) visualized include the common femoral, femoral,  popliteal, posterior tibial and peroneal veins.  3. No evidence of superficial thrombosis detected.  4. Superficial vein(s) visualized include the proximal great saphenous   vein.  5. Pulsatile flow detected.  6. Multiphasic flow in the posterior tibial artery.    ADDITIONAL COMMENTS  No significant change since previous study 27 days ago.    I have personally reviewed the data relevant to the interpretation of  this  study.    TECHNOLOGIST: Jennifer Tempesco, RVT  Signed: 12/03/2015 10:20 PM    PHYSICIAN: Zoeya Gramajo MD  Signed: 12/04/2015 08:42 AM

## 2015-12-04 NOTE — ED Notes (Signed)
Pt. Reassessed with no further complaints at this time and updated on admission bed status. Pt is content and in good spirits watching TV. MD notified and will continue to monitor.

## 2015-12-04 NOTE — ED Notes (Signed)
Pt. Has no complaints and is watching tv, she denies any pain at this time. She was updated on admission status.

## 2015-12-04 NOTE — ED Notes (Signed)
Pt. Is requesting her percocet at this time. Pt was assisted to the restroom. Pt. Was reassessed and MD was notified. Will continue to monitor.

## 2015-12-04 NOTE — ED Notes (Signed)
Pt. Reassessed and assisted to the bathroom. Pt was given a diet tray at this time and requested to take her home medication. Pt took her home medications and the MD was notified. Will continue to monitor.

## 2015-12-05 LAB — CBC WITH AUTOMATED DIFF
ABS. BASOPHILS: 0 10*3/uL (ref 0.0–0.1)
ABS. EOSINOPHILS: 0.1 10*3/uL (ref 0.0–0.4)
ABS. LYMPHOCYTES: 1.7 10*3/uL (ref 0.9–3.6)
ABS. MONOCYTES: 0.6 10*3/uL (ref 0.05–1.2)
ABS. NEUTROPHILS: 2.4 10*3/uL (ref 1.8–8.0)
BASOPHILS: 0 % (ref 0–2)
EOSINOPHILS: 1 % (ref 0–5)
HCT: 37.3 % (ref 35.0–45.0)
HGB: 12 g/dL (ref 12.0–16.0)
LYMPHOCYTES: 36 % (ref 21–52)
MCH: 27 PG (ref 24.0–34.0)
MCHC: 32.2 g/dL (ref 31.0–37.0)
MCV: 83.8 FL (ref 74.0–97.0)
MONOCYTES: 13 % — ABNORMAL HIGH (ref 3–10)
MPV: 9.6 FL (ref 9.2–11.8)
NEUTROPHILS: 50 % (ref 40–73)
PLATELET: 299 10*3/uL (ref 135–420)
RBC: 4.45 M/uL (ref 4.20–5.30)
RDW: 14.6 % — ABNORMAL HIGH (ref 11.6–14.5)
WBC: 4.8 10*3/uL (ref 4.6–13.2)

## 2015-12-05 LAB — METABOLIC PANEL, BASIC
Anion gap: 8 mmol/L (ref 3.0–18)
BUN/Creatinine ratio: 17 (ref 12–20)
BUN: 10 MG/DL (ref 7.0–18)
CO2: 27 mmol/L (ref 21–32)
Calcium: 9.9 MG/DL (ref 8.5–10.1)
Chloride: 105 mmol/L (ref 100–108)
Creatinine: 0.6 MG/DL (ref 0.6–1.3)
GFR est AA: >60 mL/min/{1.73_m2} (ref >60)
GFR est non-AA: >60 mL/min/{1.73_m2} (ref >60)
Glucose: 95 mg/dL (ref 74–99)
Potassium: 4.1 mmol/L (ref 3.5–5.5)
Sodium: 140 mmol/L (ref 136–145)

## 2015-12-05 LAB — GLUCOSE, POC: Glucose (POC): 128 mg/dL — ABNORMAL HIGH (ref 70–110)

## 2015-12-05 MED ORDER — SODIUM CHLORIDE 0.9 % IJ SYRG
Freq: Three times a day (TID) | INTRAMUSCULAR | Status: DC
Start: 2015-12-05 — End: 2015-12-05
  Administered 2015-12-05 (×2): via INTRAVENOUS

## 2015-12-05 MED ORDER — DEXTROSE 50% IN WATER (D50W) IV SYRG
INTRAVENOUS | Status: DC | PRN
Start: 2015-12-05 — End: 2015-12-05

## 2015-12-05 MED ORDER — INSULIN LISPRO 100 UNIT/ML INJECTION
100 unit/mL | Freq: Four times a day (QID) | SUBCUTANEOUS | Status: DC
Start: 2015-12-05 — End: 2015-12-05

## 2015-12-05 MED ORDER — ACETAMINOPHEN 325 MG TABLET
325 mg | ORAL | Status: DC | PRN
Start: 2015-12-05 — End: 2015-12-05

## 2015-12-05 MED ORDER — GLUCAGON 1 MG INJECTION
1 mg | INTRAMUSCULAR | Status: DC | PRN
Start: 2015-12-05 — End: 2015-12-05

## 2015-12-05 MED ORDER — ALBUTEROL SULFATE HFA 90 MCG/ACTUATION AEROSOL INHALER
90 mcg/actuation | Freq: Four times a day (QID) | RESPIRATORY_TRACT | Status: DC | PRN
Start: 2015-12-05 — End: 2015-12-05

## 2015-12-05 MED ORDER — BUSPIRONE 5 MG TAB
5 mg | Freq: Three times a day (TID) | ORAL | Status: DC
Start: 2015-12-05 — End: 2015-12-05
  Administered 2015-12-05: 14:00:00 via ORAL

## 2015-12-05 MED ORDER — FLUOXETINE 20 MG CAP
20 mg | Freq: Every day | ORAL | Status: DC
Start: 2015-12-05 — End: 2015-12-05
  Administered 2015-12-05: 14:00:00 via ORAL

## 2015-12-05 MED ORDER — ALBUTEROL SULFATE 0.083 % (0.83 MG/ML) SOLN FOR INHALATION
2.5 mg /3 mL (0.083 %) | Freq: Four times a day (QID) | RESPIRATORY_TRACT | Status: DC | PRN
Start: 2015-12-05 — End: 2015-12-05

## 2015-12-05 MED ORDER — GLUCOSE 4 GRAM CHEWABLE TAB
4 gram | ORAL | Status: DC | PRN
Start: 2015-12-05 — End: 2015-12-05

## 2015-12-05 MED ORDER — PREGABALIN 75 MG CAP
75 mg | Freq: Two times a day (BID) | ORAL | Status: DC
Start: 2015-12-05 — End: 2015-12-05
  Administered 2015-12-05: 14:00:00 via ORAL

## 2015-12-05 MED ORDER — RIVAROXABAN 20 MG TAB
20 mg | Freq: Every day | ORAL | Status: DC
Start: 2015-12-05 — End: 2015-12-05
  Administered 2015-12-05: 14:00:00 via ORAL

## 2015-12-05 MED ORDER — FLUTICASONE 50 MCG/ACTUATION NASAL SPRAY, SUSP
50 mcg/actuation | Freq: Every day | NASAL | Status: DC
Start: 2015-12-05 — End: 2015-12-05
  Administered 2015-12-05: 14:00:00 via NASAL

## 2015-12-05 MED ORDER — SODIUM CHLORIDE 0.9 % IJ SYRG
INTRAMUSCULAR | Status: DC | PRN
Start: 2015-12-05 — End: 2015-12-05

## 2015-12-05 MED ORDER — RAMIPRIL 5 MG CAP
5 mg | Freq: Every day | ORAL | Status: DC
Start: 2015-12-05 — End: 2015-12-05
  Administered 2015-12-05: 14:00:00 via ORAL

## 2015-12-05 MED ORDER — FLUTICASONE FUROATE 100 MCG/ACTUATION BREATH ACTIVATED INHALER
100 mcg/actuation | Freq: Every day | RESPIRATORY_TRACT | Status: DC
Start: 2015-12-05 — End: 2015-12-05
  Administered 2015-12-05: 15:00:00 via RESPIRATORY_TRACT

## 2015-12-05 MED ORDER — METFORMIN 500 MG TAB
500 mg | Freq: Two times a day (BID) | ORAL | Status: DC
Start: 2015-12-05 — End: 2015-12-05
  Administered 2015-12-05: 14:00:00 via ORAL

## 2015-12-05 MED ORDER — CALCIUM CITRATE-VITAMIN D3 315 MG-250 UNIT TAB
315 mg-6.25 mcg (250 unit) | Freq: Two times a day (BID) | ORAL | Status: DC
Start: 2015-12-05 — End: 2015-12-05
  Administered 2015-12-05: 14:00:00 via ORAL

## 2015-12-05 MED ORDER — OXYCODONE-ACETAMINOPHEN 5 MG-325 MG TAB
5-325 mg | Freq: Once | ORAL | Status: AC
Start: 2015-12-05 — End: 2015-12-05
  Administered 2015-12-05: 15:00:00 via ORAL

## 2015-12-05 MED ORDER — AMLODIPINE 10 MG TAB
10 mg | Freq: Every day | ORAL | Status: DC
Start: 2015-12-05 — End: 2015-12-05

## 2015-12-05 MED FILL — METFORMIN 500 MG TAB: 500 mg | ORAL | Qty: 2

## 2015-12-05 MED FILL — BUSPIRONE 5 MG TAB: 5 mg | ORAL | Qty: 3

## 2015-12-05 MED FILL — LYRICA 75 MG CAPSULE: 75 mg | ORAL | Qty: 1

## 2015-12-05 MED FILL — RAMIPRIL 5 MG CAP: 5 mg | ORAL | Qty: 1

## 2015-12-05 MED FILL — OXYCODONE-ACETAMINOPHEN 5 MG-325 MG TAB: 5-325 mg | ORAL | Qty: 1

## 2015-12-05 MED FILL — XARELTO 20 MG TABLET: 20 mg | ORAL | Qty: 1

## 2015-12-05 MED FILL — BD POSIFLUSH NORMAL SALINE 0.9 % INJECTION SYRINGE: INTRAMUSCULAR | Qty: 10

## 2015-12-05 MED FILL — ARNUITY ELLIPTA 100 MCG/ACTUATION POWDER FOR INHALATION: 100 mcg/actuation | RESPIRATORY_TRACT | Qty: 14

## 2015-12-05 MED FILL — CALCITRATE 315 MG CALCIUM-6.25 MCG (250 UNIT) TABLET: 315 mg-6.25 mcg (250 unit) | ORAL | Qty: 1

## 2015-12-05 MED FILL — FLUTICASONE 50 MCG/ACTUATION NASAL SPRAY, SUSP: 50 mcg/actuation | NASAL | Qty: 16

## 2015-12-05 MED FILL — FLUOXETINE 20 MG CAP: 20 mg | ORAL | Qty: 2

## 2015-12-05 NOTE — Other (Signed)
Received report from Tammy Scott RN. Patient resting in bed, denies needs. Will continue to monitor.

## 2015-12-05 NOTE — Progress Notes (Signed)
[facility]  Progress Note    Patient: Cynthia LevanFrankie Marsh Age: 64 y.o. DOB: 09/18/1951 MR#: [id number] SSN: [id number]  Date/Time: 12/05/2015 9:06 AM    Subjective:     No new events, mentation at baseline    Objective:   VS:   Visit Vitals   ??? BP 120/82 (BP 1 Location: Left arm, BP Patient Position: At rest)   ??? Pulse 92   ??? Temp 97.2 ??F (36.2 ??C)   ??? Resp 19   ??? Ht 5\' 4"  (1.626 m)   ??? Wt 79.3 kg (174 lb 12.8 oz)   ??? SpO2 98%   ??? BMI 30 kg/m2      Tmax/24hrs: Temp (24hrs), Avg:97.4 ??F (36.3 ??C), Min:97.2 ??F (36.2 ??C), Max:97.8 ??F (36.6 ??C)     Input/Output: No intake or output data in the 24 hours ending 12/05/15 0906    General:  nad  Cardiovascular:  s1s2  Pulmonary:  clear  GI:  benign  Extremities:  No edema  Additional:  No skin changes    Labs:    Recent Results (from the past 24 hour(s))   METABOLIC PANEL, BASIC    Collection Time: 12/05/15  6:33 AM   Result Value Ref Range    Sodium 140 136 - 145 mmol/L    Potassium 4.1 3.5 - 5.5 mmol/L    Chloride 105 100 - 108 mmol/L    CO2 27 21 - 32 mmol/L    Anion gap 8 3.0 - 18 mmol/L    Glucose 95 74 - 99 mg/dL    BUN 10 7.0 - 18 MG/DL    Creatinine 7.820.60 0.6 - 1.3 MG/DL    BUN/Creatinine ratio 17 12 - 20      GFR est AA >60 >60 ml/min/1.2173m2    GFR est non-AA >60 >60 ml/min/1.6373m2    Calcium 9.9 8.5 - 10.1 MG/DL   CBC WITH AUTOMATED DIFF    Collection Time: 12/05/15  6:33 AM   Result Value Ref Range    WBC 4.8 4.6 - 13.2 K/uL    RBC 4.45 4.20 - 5.30 M/uL    HGB 12.0 12.0 - 16.0 g/dL    HCT 95.637.3 21.335.0 - 08.645.0 %    MCV 83.8 74.0 - 97.0 FL    MCH 27.0 24.0 - 34.0 PG    MCHC 32.2 31.0 - 37.0 g/dL    RDW 57.814.6 (H) 46.911.6 - 14.5 %    PLATELET 299 135 - 420 K/uL    MPV 9.6 9.2 - 11.8 FL    NEUTROPHILS 50 40 - 73 %    LYMPHOCYTES 36 21 - 52 %    MONOCYTES 13 (H) 3 - 10 %    EOSINOPHILS 1 0 - 5 %    BASOPHILS 0 0 - 2 %    ABS. NEUTROPHILS 2.4 1.8 - 8.0 K/UL    ABS. LYMPHOCYTES 1.7 0.9 - 3.6 K/UL    ABS. MONOCYTES 0.6 0.05 - 1.2 K/UL     ABS. EOSINOPHILS 0.1 0.0 - 0.4 K/UL    ABS. BASOPHILS 0.0 0.0 - 0.1 K/UL    DF AUTOMATED     GLUCOSE, POC    Collection Time: 12/05/15  8:42 AM   Result Value Ref Range    Glucose (POC) 128 (H) 70 - 110 mg/dL     Additional Data Reviewed:      Current Facility-Administered Medications   Medication Dose Route Frequency   ??? [START ON  12/06/2015] amLODIPine (NORVASC) tablet 10 mg  10 mg Oral DAILY   ??? fluticasone furoate (ARNUITY ELLIPTA) 100 mcg/puff  2 Puff Inhalation DAILY   ??? busPIRone (BUSPAR) tablet 15 mg  15 mg Oral TID   ??? calcium citrate-vitamin D3 (CITRACAL WITH VITAMIN D MAXIMUM) tablet 1 Tab  1 Tab Oral BID   ??? FLUoxetine (PROzac) capsule 40 mg  40 mg Oral DAILY   ??? pregabalin (LYRICA) capsule 75 mg  75 mg Oral BID   ??? metFORMIN (GLUCOPHAGE) tablet 1,000 mg  1,000 mg Oral BID WITH MEALS   ??? fluticasone (FLONASE) 50 mcg/actuation nasal spray 2 Spray  2 Spray Both Nostrils DAILY   ??? rivaroxaban (XARELTO) tablet 20 mg  20 mg Oral DAILY   ??? ramipril (ALTACE) capsule 5 mg  5 mg Oral DAILY   ??? insulin lispro (HUMALOG) injection   SubCUTAneous AC&HS   ??? glucose chewable tablet 16 g  4 Tab Oral PRN   ??? glucagon (GLUCAGEN) injection 1 mg  1 mg IntraMUSCular PRN   ??? dextrose (D50W) injection syrg 12.5-25 g  25-50 mL IntraVENous PRN   ??? sodium chloride (NS) flush 5-10 mL  5-10 mL IntraVENous Q8H   ??? sodium chloride (NS) flush 5-10 mL  5-10 mL IntraVENous PRN   ??? acetaminophen (TYLENOL) tablet 650 mg  650 mg Oral Q4H PRN   ??? albuterol (PROVENTIL VENTOLIN) nebulizer solution 2.5 mg  2.5 mg Nebulization Q6H PRN       Assessment/Plan:     Patient Active Problem List   Diagnosis Code   ??? Depression F32.9   ??? Anxiety F41.9   ??? Insomnia G47.00   ??? Mild intermittent asthma without complication J45.20   ??? Chronic pain of right knee M25.561, G89.29   ??? 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 Type 2 diabetes mellitus without complication (HCC) E11.9    ??? Essential hypertension with goal blood pressure less than 140/90 I10   ??? Intractable migraine without aura and without status migrainosus G43.019   ??? Hx of spinal fusion Z98.1   ??? Atypical chest pain R07.89   ??? Acute pulmonary embolism (HCC) I26.99   ??? Pulmonary emboli (HCC) I26.99   ??? Hypokalemia E87.6   ??? Ataxia R27.0   ??? Altered mental status R41.82   ??? Encephalopathy acute G93.40       Plan:    Dc home  Fu with pcp     Case discussed with:  Patient  Family  Nursing  Case Management  DVT Prophylaxis:  Lovenox  Hep SQ  SCDs  Coumadin   On Heparin gtt    Signed By: Chauncey ReadingMichael S Jarrod Bodkins, MD

## 2015-12-05 NOTE — Progress Notes (Signed)
11:49 AM  Reviewed discharge information and medications wuith patient, who verbalizes understanding.  Pt is very involved with her own care.  IV and tele box removed.  Pt to discharge home via wheelchair.

## 2015-12-05 NOTE — Progress Notes (Signed)
Albuterol NEB was therapeutically interchanged for Albuterol INH  per the P&T Committee approved Production assistant, radioTherapeutic Interchanges Policy.    Su GrandLarry M Salim, [facility]amonRPH, Pharmacist  12/05/2015 3:19 AM

## 2015-12-05 NOTE — Other (Signed)
Bedside and Verbal shift change report given to Jessica, RN (oncoming nurse) by Christina E Cournoyer, RN (offgoing nurse). Report included the following information SBAR, Kardex, Intake/Output, MAR and Recent Results.

## 2015-12-05 NOTE — H&P (Signed)
History and Physical    Patient: Cynthia Marsh MRN: [id number]  SSN: [id number]    Date of Birth: 1951-10-20  Age: 64 y.o.  Sex: female      Subjective:      Cynthia Marsh is a 63 y.o. female with PMH of Asthma , DM2, HTN , H/o PE on [facility] , being admitted for AMS .     Patient is confused regarding history of current events     Per ED MD patient came at [facility] with Craig . Not much history was available , She received CT head , MRI head , DVT LL , Chest xray - all were unremarkable . Patient was excessively somnolent .     Here at [facility] - she just came ( 12:14 AM ) , sleepy , after talking to her she woke up . Her speech id ok but she thinks she is at her home , Reason she went to ED was " her foot was injured with glass" ( No Evidence of any injury . She appears confused , no focal deficit . Patient is ambulatory . She says she also has cough .     Full CODE   DVT prophylaxis - Xarelto     Past Medical History   Diagnosis Date   ??? Anxiety    ??? Asthma    ??? Cardiac echocardiogram 11/07/2015     EF 60%.  No RWMA.  Gr 1 DDfx.  RVSP 30 mmHg.     ??? Cardiovascular lower extremity venous duplex 11/06/2015     No DVT bilaterally.   ??? Chronic kidney disease    ??? Depression    ??? Diabetes ([place])    ??? Hypertension    ??? Insomnia    ??? Osteoporosis    ??? Thromboembolus [facility])      Past Surgical History   Procedure Laterality Date   ??? Pr anesth,surgery of shoulder     ??? Pr hand/finger surgery unlisted     ??? Pr laminectomy,cervical     ??? Hx cyst removal       calf    ??? Hx gyn       complete hyst    ??? Hx gyn       tubal ligation   ??? Hx wrist fracture tx     ??? Hx back surgery  2012     L4/L5/S1 fused in [place]   ??? Hx orthopaedic       Right shoulder rotator cuff    ??? Hx orthopaedic       Left Little toe hammer toe, left big toe bunion removed   ??? Hx orthopaedic       Right knee torn meniscus   ??? Hx orthopaedic       CTS bilateral , Left thumb trigger finger    ??? Pr breast surgery procedure unlisted        Left cyst removed     ??? Hx cyst incision and drainage Left    ??? Hx hysterectomy     ??? Hx oophorectomy        Family History   Problem Relation Age of Onset   ??? Diabetes Mother    ??? Diabetes Brother      Social History   Substance Use Topics   ??? Smoking status: Former Smoker   ??? Smokeless tobacco: Never Used   ??? Alcohol use Yes      Comment: on occasion      Prior to  Admission medications    Medication Sig Start Date End Date Taking? Authorizing Provider   beclomethasone (QVAR) 80 mcg/actuation inhaler Take 1 Puff by inhalation two (2) times a day.   Yes Phys Other, MD   rivaroxaban (XARELTO) 20 mg tab tablet Take 1 Tab by mouth daily. 12/01/15  Yes Thana Farr, MD   cyclobenzaprine (FLEXERIL) 10 mg tablet Take 0.5-1 Tabs by mouth three (3) times daily as needed for Muscle Spasm(s). 11/25/15  Yes Lyla Glassing, NP   LYRICA 50 mg capsule TAKE ONE CAPSULE BY MOUTH THREE TIMES DAILY. MAX DAILY AMOUNT: '150MG'$  11/20/15  Yes Lowella Fairy, DO   montelukast (SINGULAIR) 10 mg tablet TAKE 1 TABLET BY MOUTH DAILY 11/17/15  Yes Lowella Fairy, DO   budesonide (PULMICORT) 180 mcg/actuation aepb inhaler Take 2 Puffs by inhalation two (2) times a day. 11/15/15  Yes Thana Farr, MD   albuterol (PROVENTIL HFA, VENTOLIN HFA, PROAIR HFA) 90 mcg/actuation inhaler Take 2 Puffs by inhalation every six (6) hours as needed for Shortness of Breath. 10/14/15  Yes Thana Farr, MD   amLODIPine (NORVASC) 10 mg tablet TAKE 1 TABLET BY MOUTH DAILY 10/09/15  Yes Nurudeen S Lawani, DO   ALPRAZolam (XANAX) 0.5 mg tablet TAKE 1 TABLET BY MOUTH TWICE DAILY. MAX DAILY AMOUNT: '1MG'$  10/08/15  Yes Lowella Fairy, DO   ramipril (ALTACE) 5 mg capsule Take 1 Cap by mouth daily. 10/06/15  Yes Nurudeen S Lawani, DO   fexofenadine (ALLEGRA) 180 mg tablet Take 180 mg by mouth daily.   Yes Thana Farr, MD   azelastine (ASTELIN) 137 mcg (0.1 %) nasal spray 2 Sprays by Both Nostrils route two (2) times a day. 09/02/15  Yes Thana Farr, MD    pravastatin (PRAVACHOL) 40 mg tablet Take 1 Tab by mouth nightly. 08/06/15  Yes Thana Farr, MD   oxybutynin chloride XL (DITROPAN XL) 10 mg CR tablet Take 1 Tab by mouth daily. 08/06/15  Yes Thana Farr, MD   mometasone (NASONEX) 50 mcg/actuation nasal spray 2 Sprays by Both Nostrils route daily. 07/10/15  Yes Thana Farr, MD   metFORMIN (GLUCOPHAGE) 1,000 mg tablet TAKE 1 TABLET BY MOUTH TWICE DAILY WITH MEALS 07/07/15  Yes Thana Farr, MD   meclizine (ANTIVERT) 25 mg tablet Take 1 Tab by mouth three (3) times daily as needed. Indications: VERTIGO 06/02/15  Yes Blake Divine, DO   mirtazapine (REMERON) 30 mg tablet Take 30 mg by mouth nightly.   Yes Historical Provider   calcium citrate-vitamin d3 (CITRACAL+D) 315-200 mg-unit tab Take 1 Tab by mouth daily (with breakfast).   Yes Historical Provider   busPIRone (BUSPAR) 15 mg tablet Take 15 mg by mouth three (3) times daily. 02/04/15  Yes Historical Provider   zolpidem (AMBIEN) 10 mg tablet Take 10 mg by mouth nightly. 01/18/15  Yes Historical Provider   FLUoxetine (PROZAC) 40 mg capsule Take 40 mg by mouth daily. 01/06/15  Yes Historical Provider   hydrochlorothiazide (HYDRODIURIL) 25 mg tablet Take 25 mg by mouth daily. 01/06/15  Yes Historical Provider   chlorpheniramine-HYDROcodone (TUSSIONEX) 10-8 mg/5 mL suspension Take 5 mL by mouth every twelve (12) hours as needed for Cough. Max Daily Amount: 10 mL. 11/19/15   Lowella Fairy, DO   oxyCODONE-acetaminophen (PERCOCET 10) 10-325 mg per tablet Take 1 Tab by mouth every six (6) hours as needed for Pain. Max Daily Amount: 4 Tabs. 11/12/15   Thana Farr,  MD   HYDROmorphone (DILAUDID) 2 mg tablet Take 1 Tab by mouth every four (4) hours as needed for Pain. Max Daily Amount: 12 mg. 11/09/15   Ignacia Bayley, MD   EPINEPHrine (EPIPEN) 0.3 mg/0.3 mL injection 0.3 mg by IntraMUSCular route once as needed for Allergic Response.    Thana Farr, MD   Lancets misc by Does Not Apply route.    Historical Provider    topiramate (TOPAMAX) 25 mg tablet 1 tab daily for 1 week then BID 07/10/15   Thana Farr, MD   FREESTYLE LITE STRIPS strip USE TO CHECK BLOOD SUGAR TWICE DAILY 07/10/15   Thana Farr, MD   FREESTYLE LANCETS 28 gauge misc CHECK BLOOD SUGAR TWICE DAILY 07/10/15   Thana Farr, MD        Allergies   Allergen Reactions   ??? 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   ??? Almond Other (comments)     Tongue itches   ??? Carvedilol Other (comments)   ??? Cozaar [Losartan] Cough   ??? Crestor [Rosuvastatin] Unknown (comments)     Heart beats fast       Review of Systems:  A comprehensive review of systems was negative except for that written in the History of Present Illness.    Objective:     Vitals:    12/04/15 1800 12/04/15 1921 12/04/15 2046 12/04/15 2200   BP: 126/42 125/87 (!) 132/93 107/73   Pulse: 90 81 86 84   Resp: '25 22 18 18   '$ Temp:   97.8 ??F (36.6 ??C) 97.4 ??F (36.3 ??C)   SpO2: 95% 99% 97% 98%   Weight:       Height:            Physical Exam:  General appearance - awake but confused   Mental status - confused, disoriented to place , person   Eyes - pupils equal and reactive, extraocular eye movements intact  Ears - bilateral TM's and external ear canals normal  Nose - normal and patent, no erythema, discharge or polyps  Mouth - mucous membranes moist, pharynx normal without lesions  Neck - supple, no significant adenopathy  Chest - clear to auscultation, no wheezes, rales or rhonchi, symmetric air entry  Heart - normal rate, regular rhythm, normal S1, S2, no murmurs, rubs, clicks or gallops  Abdomen - soft, nontender, nondistended, no masses or organomegaly  Neurological - motor and sensory grossly normal bilaterally  Musculoskeletal - no joint tenderness, deformity or swelling  Extremities - peripheral pulses normal, no pedal edema, no clubbing or cyanosis  Skin - normal coloration and turgor, no rashes, no suspicious skin lesions noted     Assessment:     Hospital Problems  Date Reviewed: 17-Dec-2015           Codes Class Noted POA    Hypokalemia ICD-10-CM: E87.6  ICD-9-CM: 276.8  12/03/2015 Unknown        Ataxia ICD-10-CM: R27.0  ICD-9-CM: 781.3  12/03/2015 Unknown        Altered mental status ICD-10-CM: R41.82  ICD-9-CM: 780.97  12/03/2015 Unknown        Type 2 diabetes mellitus without complication ([place]) WNU-27-OZ: E11.9  ICD-9-CM: 250.00  04/20/2015 Yes        Essential hypertension with goal blood pressure less than 140/90 ICD-10-CM: I10  ICD-9-CM: 401.9  04/20/2015 Yes  CBC:  Lab Results   Component Value Date/Time    WBC 7.8 12/03/2015 02:10 PM    HGB 11.4 12/03/2015 02:10 PM    HCT 37.3 12/03/2015 02:10 PM    PLATELET 282 12/03/2015 02:10 PM    MCV 85.4 12/03/2015 02:10 PM        CMP:  Lab Results   Component Value Date/Time    Sodium 142 12/03/2015 02:10 PM    Potassium 3.3 12/03/2015 02:10 PM    Chloride 102 12/03/2015 02:10 PM    CO2 29 12/03/2015 02:10 PM    Anion gap 11 12/03/2015 02:10 PM    Glucose 89 12/03/2015 02:10 PM    BUN 14 12/03/2015 02:10 PM    Creatinine 0.63 12/03/2015 02:10 PM    BUN/Creatinine ratio 22 12/03/2015 02:10 PM    GFR est AA >60 12/03/2015 02:10 PM    GFR est non-AA >60 12/03/2015 02:10 PM    Calcium 9.9 12/03/2015 02:10 PM    ALT 32 11/06/2015 10:32 AM    AST 17 11/06/2015 10:32 AM    Alk. phosphatase 67 11/06/2015 10:32 AM    Protein, total 8.0 11/06/2015 10:32 AM    Albumin 4.0 11/06/2015 10:32 AM    Globulin 4.0 11/06/2015 10:32 AM    A-G Ratio 1.0 11/06/2015 10:32 AM        PT/INR  Lab Results   Component Value Date/Time    INR 1.4 12/03/2015 02:10 PM    Prothrombin time 16.7 12/03/2015 02:10 PM            EKG: No results found for this or any previous visit.       Plan:   Acute encephalopathy   - AMS of unknown etiology , ? Drug OD , CVA ( MRI , CT head - negative )   - Drug screen positive for Opiates , Benzo , Tricyclic   - Patient is becoming more awake but due to profound AMS she is still confused   - Continue to observe in patient     HTN   - Continue her home meds   - Monitor BP    DM2  - SSI     H/o PE continue Xarelto     Hypokalemia   - Replace K+     Signed By: Eyvonne Left, MD     December 05, 2015

## 2015-12-05 NOTE — Progress Notes (Signed)
Chaplain conducted an initial consultation and Spiritual Assessment for Cynthia Marsh, who is a 64 y.o.,female. Patient???s Primary Language is: [place].   According to the patient???s EMR Religious Affiliation is: Baptist.     The reason the Patient came to the hospital is:   Patient Active Problem List    Diagnosis Date Noted   ??? Encephalopathy acute 12/05/2015   ??? Hypokalemia 12/03/2015   ??? Ataxia 12/03/2015   ??? Altered mental status 12/03/2015   ??? Pulmonary emboli (HCC) 11/07/2015   ??? Atypical chest pain 11/06/2015   ??? Acute pulmonary embolism (HCC) 11/06/2015   ??? Hx of spinal fusion 08/17/2015   ??? Intractable migraine without aura and without status migrainosus 08/06/2015   ??? Mild intermittent asthma without complication 04/20/2015   ??? Chronic pain of right knee 04/20/2015   ??? Chronic bilateral low back pain without sciatica 04/20/2015   ??? Type 2 diabetes mellitus without complication (HCC) 04/20/2015   ??? Essential hypertension with goal blood pressure less than 140/90 04/20/2015   ??? Depression    ??? Anxiety    ??? Insomnia         The Chaplain provided the following Interventions:  Initiated a relationship of care and support.   Listened empathically.  Provided chaplaincy education.  Provided information about Spiritual Care Services.  Offered prayer and assurance of continued prayers on patient's behalf.   Chart reviewed.    The following outcomes were achieved:  Patient shared limited information about both their medical narrative and spiritual journey/beliefs.  Patient processed feeling about current hospitalization.  Patient expressed gratitude for the Chaplain's visit.    Assessment:  Patient does not have any religious/cultural needs that will affect patient???s preferences in health care.  Patient did not indicate any spiritual or religious issues which require Spiritual Care Services interventions at this time.     Plan:  Chaplains will continue to follow and will provide pastoral care on an as  needed/requested basis.  Chaplain recommends bedside caregivers page chaplain on duty if patient shows signs of acute spiritual or emotional distress.    Delsa Granaharles H. Blondell RevealWinslow, M.Div  [employer] Chaplain  Spiritual Care Dept.  [phone]

## 2015-12-05 NOTE — Discharge Summary (Signed)
[facility]  [facility]CHARGE [facility]    Name:  Cynthia LevanJACKSON, [place]  MR#:  [id number]  DOB:  1951-10-28  Account #:  [id number]  Date of Adm:  12/03/2015  Date of Discharge:      DISCHARGE DIAGNOSES: Toxic and metabolic encephalopathy  secondary to polypharmacy.    SECONDARY DIAGNOSES: Include:  1. History of pulmonary embolism. She is on Xarelto.  2. Chronic kidney disease.  3. Depression.  4. Diabetes.    CONSULTANTS: None.    PROCEDURES  1. MRI, 12/03/2015, unremarkable exam for patient age.  2. CT head, 12/03/2015, brain looks normal for age.  3. Lower extremity Dopplers, 12/04/2015, no lower extremity DVT.    HISTORY OF PRESENT ILLNESS AND HOSPITAL COURSE: The  patient is a 64 year old female who has past medical history as  mentioned above. Please refer to the dictated history and physical.  She was admitted, as she was having some changes in her mental  status. She was worked up for an infectious etiology. No antibiotics  were given during her hospitalization. Did not have an elevated white  count. There is no evidence of any fevers to suggest an infection. She  had a chest x-ray, MRI, CT, ultrasound with the results as stated  above. She was continued on her Xarelto for her history of a PE that  was diagnosed in December of 2016. During her hospitalization,  though, a urinary drug study, done on 12/03/2015, showed it was  positive for benzodiazepines, opiates, and tricyclics. She states that  she takes all of these, does not abuse medications. There was some  concern for possibly over use of her medications, but as the patient  has mentation at baseline and no other complaints she is discharged  home today.    DISCHARGE MEDICATIONS  1.  Albuterol as needed.  2.  Allegra 180 daily.  3.  Xanax as prescribed.  4.  Norvasc 10 daily.  5.  Astelin spray.  6.  Pulmicort twice daily.  7.  BuSpar 15 t.i.d.  8.  Calcium and vitamin D.  9.  Tussionex as needed.  10. Flexeril as prescribed.  11. EpiPen.   12. Prozac 40 daily.  13. Hydrochlorothiazide 25 daily.  14. Dilaudid as prescribed.  15. Lyrica 50 three times daily.  16. Antivert as needed.  17. Metformin 1000 twice daily with meals.  18. Remeron 30 at night.  19. Nasonex.  20. Singulair.  21. Oxybutynin.  22. Percocet as prescribed.  23. Pravachol 40 at night.  24. QVAR twice daily.  25. Ramipril 5 daily.  26. Xarelto 20 daily.  27. Topamax 20 twice daily.  28. Ambien as prescribed.    DISPOSITION: Discharged home.    DIETH: She should be on a diabetic diet.    FOLLOWUP: Follow up with Dr. Shawnie PonsHunte and all other consultants as  scheduled.        Lakeyta Vandenheuvel, MD    MC / CR  D:  12/05/2015   09:11  T:  12/05/2015   09:36  Job #:  [id number]

## 2015-12-06 DIAGNOSIS — Z86711 Personal history of pulmonary embolism: Secondary | ICD-10-CM

## 2015-12-06 HISTORY — DX: Personal history of pulmonary embolism: Z86.711

## 2015-12-10 ENCOUNTER — Ambulatory Visit: Admit: 2015-12-10 | Discharge: 2015-12-10 | Payer: MEDICARE | Attending: Internal Medicine | Primary: Family Medicine

## 2015-12-10 DIAGNOSIS — G8929 Other chronic pain: Secondary | ICD-10-CM

## 2015-12-10 MED ORDER — OXYBUTYNIN CHLORIDE SR 10 MG 24 HR TAB
10 mg | ORAL_TABLET | Freq: Every day | ORAL | 2 refills | Status: DC
Start: 2015-12-10 — End: 2016-01-11

## 2015-12-10 MED ORDER — BENZONATATE 100 MG CAP
100 mg | ORAL_CAPSULE | Freq: Three times a day (TID) | ORAL | 1 refills | Status: AC | PRN
Start: 2015-12-10 — End: 2015-12-17

## 2015-12-10 MED ORDER — TRIMETHOPRIM-POLYMYXIN B 0.1 %-10,000 UNIT/ML EYE DROPS
10000 unit- 1 mg/mL | OPHTHALMIC | 0 refills | Status: DC
Start: 2015-12-10 — End: 2015-12-15

## 2015-12-10 MED ORDER — OXYCODONE-ACETAMINOPHEN 10 MG-325 MG TAB
10-325 mg | ORAL_TABLET | Freq: Four times a day (QID) | ORAL | 0 refills | Status: DC | PRN
Start: 2015-12-10 — End: 2016-01-05

## 2015-12-10 MED ORDER — ALPRAZOLAM 0.5 MG TAB
0.5 mg | ORAL_TABLET | ORAL | 0 refills | Status: DC
Start: 2015-12-10 — End: 2016-01-05

## 2015-12-10 NOTE — Patient Instructions (Signed)
Hip Pain: Care Instructions  Your Care Instructions  Hip pain may be caused by many things, including overuse, a fall, or a twisting movement. Another cause of hip pain is arthritis. Your pain may increase when you stand up, walk, or squat. The pain may come and go or may be constant.  Home treatment can help relieve hip pain, swelling, and stiffness. If your pain is ongoing, you may need more tests and treatment.  Follow-up care is a key part of your treatment and safety. Be sure to make and go to all appointments, and call your doctor if you are having problems. It???s also a good idea to know your test results and keep a list of the medicines you take.  How can you care for yourself at home?  ?? Take pain medicines exactly as directed.  ?? If the doctor gave you a prescription medicine for pain, take it as prescribed.  ?? If you are not taking a prescription pain medicine, ask your doctor if you can take an over-the-counter medicine.  ?? Rest and protect your hip. Take a break from any activity, including standing or walking, that may cause pain.  ?? Put ice or a cold pack against your hip for 10 to 20 minutes at a time. Try to do this every 1 to 2 hours for the next 3 days (when you are awake) or until the swelling goes down. Put a thin cloth between the ice and your skin.  ?? Sleep on your healthy side with a pillow between your knees, or sleep on your back with pillows under your knees.  ?? If there is no swelling, you can put moist heat, a heating pad, or a warm cloth on your hip. Do gentle stretching exercises to help keep your hip flexible.  ?? Learn how to prevent falls. Have your vision and hearing checked regularly. Wear slippers or shoes with a nonskid sole.  ?? Stay at a healthy weight.  ?? Wear comfortable shoes.  When should you call for help?  Call 911 anytime you think you may need emergency care. For example, call if:  ?? You have sudden chest pain and shortness of breath, or you cough up blood.   ?? You are not able to stand or walk or bear weight.  ?? Your buttocks, legs, or feet feel numb or tingly.  ?? Your leg or foot is cool or pale or changes color.  ?? You have severe pain.  Call your doctor now or seek immediate medical care if:  ?? You have signs of infection, such as:  ?? Increased pain, swelling, warmth, or redness in the hip area.  ?? Red streaks leading from the hip area.  ?? Pus draining from the hip area.  ?? A fever.  ?? You have signs of a blood clot, such as:  ?? Pain in your calf, back of the knee, thigh, or groin.  ?? Redness and swelling in your leg or groin.  ?? You are not able to bend, straighten, or move your leg normally.  ?? You have trouble urinating or having bowel movements.  Watch closely for changes in your health, and be sure to contact your doctor if:  ?? You do not get better as expected.  Where can you learn more?  Go to http://www.healthwise.net/[employer].  Enter Z720 in the search box to learn more about "Hip Pain: Care Instructions."  Current as of: May 01, 2015  Content Version: 11.1  ?? 2006-2016 Healthwise,   Incorporated. Care instructions adapted under license by [employer] (which disclaims liability or warranty for this information). If you have questions about a medical condition or this instruction, always ask your healthcare professional. Healthwise, Incorporated disclaims any warranty or liability for your use of this information.

## 2015-12-10 NOTE — Progress Notes (Signed)
Cynthia Marsh is a 65 y.o.  female and presents with Hospital Follow Up (MV) and Anxiety      SUBJECTIVE:  Pt was recently hospitalized for delirium that was probably due to oversedation from polypharmacy. Pt had a completely normal evaluation in the hospital but with recent pulmonary embolism using percocet in combination with Xanax is probably decreasing her respiratory drive. Pt was told that she will be tapered off Xanax with just 30 more tablets. If her anxiety was so severe that it was needed it would have to come from her NP Toney Rakes) at [facility] psychiatry that prescribes her medications.  Pt will also be referred to Pain management since she appears to be using more narcotics but still is not very functional. If she is not accepted to Pain Management will begin to reduce her narcotic pain meds.      Pt c/o redness in right eye today along with drainage and feeling like there is sand in her eye.      Pt continues to c/o cough so she was given tessalon Perles. She continues on Xarelto for pulmonary embolism.      Respiratory ROS: negative for - shortness of breath  Cardiovascular ROS: negative for - chest pain    Current Outpatient Prescriptions   Medication Sig   ??? oxyCODONE-acetaminophen (PERCOCET 10) 10-325 mg per tablet Take 1 Tab by mouth every six (6) hours as needed for Pain. Max Daily Amount: 4 Tabs.   ??? ALPRAZolam (XANAX) 0.5 mg tablet TAKE 1 TABLET BY MOUTH TWICE DAILY. MAX DAILY AMOUNT: 1MG    ??? oxybutynin chloride XL (DITROPAN XL) 10 mg CR tablet Take 1 Tab by mouth daily.   ??? benzonatate (TESSALON) 100 mg capsule Take 1 Cap by mouth three (3) times daily as needed for Cough for up to 7 days.   ??? trimethoprim-polymyxin b (POLYTRIM) ophthalmic solution Administer 1 Drop to right eye every four (4) hours for 10 days.   ??? rivaroxaban (XARELTO) 20 mg tab tablet Take 1 Tab by mouth daily.   ??? LYRICA 50 mg capsule TAKE ONE CAPSULE BY MOUTH THREE TIMES DAILY. MAX DAILY AMOUNT: 150MG     ??? montelukast (SINGULAIR) 10 mg tablet TAKE 1 TABLET BY MOUTH DAILY   ??? budesonide (PULMICORT) 180 mcg/actuation aepb inhaler Take 2 Puffs by inhalation two (2) times a day.   ??? amLODIPine (NORVASC) 10 mg tablet TAKE 1 TABLET BY MOUTH DAILY   ??? ramipril (ALTACE) 5 mg capsule Take 1 Cap by mouth daily.   ??? pravastatin (PRAVACHOL) 40 mg tablet Take 1 Tab by mouth nightly.   ??? Lancets misc by Does Not Apply route.   ??? FREESTYLE LITE STRIPS strip USE TO CHECK BLOOD SUGAR TWICE DAILY   ??? FREESTYLE LANCETS 28 gauge misc CHECK BLOOD SUGAR TWICE DAILY   ??? metFORMIN (GLUCOPHAGE) 1,000 mg tablet TAKE 1 TABLET BY MOUTH TWICE DAILY WITH MEALS   ??? mirtazapine (REMERON) 30 mg tablet Take 30 mg by mouth nightly.   ??? calcium citrate-vitamin d3 (CITRACAL+D) 315-200 mg-unit tab Take 1 Tab by mouth daily (with breakfast).   ??? busPIRone (BUSPAR) 15 mg tablet Take 15 mg by mouth three (3) times daily.   ??? FLUoxetine (PROZAC) 40 mg capsule Take 40 mg by mouth daily.   ??? hydrochlorothiazide (HYDRODIURIL) 25 mg tablet Take 25 mg by mouth daily.   ??? XARELTO 15 mg (42)- 20 mg (9) DsPk TK PO AT THE SAME TIME QD UTD WITH FOOD.   ???  beclomethasone (QVAR) 80 mcg/actuation inhaler Take 1 Puff by inhalation two (2) times a day.   ??? cyclobenzaprine (FLEXERIL) 10 mg tablet Take 0.5-1 Tabs by mouth three (3) times daily as needed for Muscle Spasm(s).   ??? albuterol (PROVENTIL HFA, VENTOLIN HFA, PROAIR HFA) 90 mcg/actuation inhaler Take 2 Puffs by inhalation every six (6) hours as needed for Shortness of Breath.   ??? fexofenadine (ALLEGRA) 180 mg tablet Take 180 mg by mouth daily.   ??? EPINEPHrine (EPIPEN) 0.3 mg/0.3 mL injection 0.3 mg by IntraMUSCular route once as needed for Allergic Response.   ??? azelastine (ASTELIN) 137 mcg (0.1 %) nasal spray 2 Sprays by Both Nostrils route two (2) times a day.   ??? topiramate (TOPAMAX) 25 mg tablet 1 tab daily for 1 week then BID   ??? mometasone (NASONEX) 50 mcg/actuation nasal spray 2 Sprays by Both  Nostrils route daily.   ??? meclizine (ANTIVERT) 25 mg tablet Take 1 Tab by mouth three (3) times daily as needed. Indications: VERTIGO   ??? zolpidem (AMBIEN) 10 mg tablet Take 10 mg by mouth nightly.     No current facility-administered medications for this visit.          OBJECTIVE:  alert, well appearing, and in no distress  Visit Vitals   ??? BP 110/80 (BP 1 Location: Left arm, BP Patient Position: Sitting)   ??? Pulse 91   ??? Temp 97.9 ??F (36.6 ??C) (Oral)   ??? Resp 20   ??? Ht 5\' 4"  (1.626 m)   ??? Wt 178 lb (80.7 kg)   ??? SpO2 97%   ??? BMI 30.55 kg/m2      well developed and well nourished  Chest - clear to auscultation, no wheezes, rales or rhonchi, symmetric air entry  Heart - normal rate, regular rhythm, normal S1, S2, no murmurs, rubs, clicks or gallops  Extremities - peripheral pulses normal, no pedal edema, no clubbing or cyanosis    Labs:   Lab Results   Component Value Date/Time    GFR est AA >60 12/05/2015 06:33 AM    GFR est non-AA >60 12/05/2015 06:33 AM    Creatinine 0.60 12/05/2015 06:33 AM    BUN 10 12/05/2015 06:33 AM    Sodium 140 12/05/2015 06:33 AM    Potassium 4.1 12/05/2015 06:33 AM    Chloride 105 12/05/2015 06:33 AM    CO2 27 12/05/2015 06:33 AM          Assessment/Plan      ICD-10-CM ICD-9-CM    1. Chronic bilateral low back pain without sciatica M54.5 724.2 Pain not optimally controlled on oxyCODONE-acetaminophen (PERCOCET 10) 10-325 mg per tablet    G89.29 338.29 REFERRAL TO PAIN CLINIC for better management of her chronic pain    2. Anxiety F41.9 300.00 Due to recent hospitalization will taper off ALPRAZolam (XANAX) 0.5 mg tablet and defer to psychiatry if they want to keep her on xanax. She is already on Prozac and Buspar which is monitored by psychiatry    3. Other acute pulmonary embolism without acute cor pulmonale (HCC) I26.99  Pt doing well on Xarelto      Follow-up Disposition:  Return in about 3 months (around 03/09/2016) for labs 1 week before.     Reviewed plan of care. Patient has provided input and agrees with goals.

## 2015-12-10 NOTE — Progress Notes (Signed)
Patient is in the office today for a hospital follow up.  Patient states she has right sided hip pain 9/10.    Do you have an Advance Directive yes    1. Have you been to the ER, urgent care clinic since your last visit?  Hospitalized since your last visit?yes, HV and MV.     2. Have you seen or consulted any other health care providers outside of the [facility] since your last visit?  Include any pap smears or colon screening. No

## 2015-12-11 ENCOUNTER — Encounter: Attending: Internal Medicine | Primary: Family Medicine

## 2015-12-11 ENCOUNTER — Encounter: Attending: Physician Assistant | Primary: Family Medicine

## 2015-12-15 ENCOUNTER — Ambulatory Visit: Admit: 2015-12-15 | Discharge: 2015-12-15 | Payer: MEDICARE | Attending: Pulmonary Disease | Primary: Family Medicine

## 2015-12-15 ENCOUNTER — Encounter: Attending: Pulmonary Disease | Primary: Family Medicine

## 2015-12-15 DIAGNOSIS — I2699 Other pulmonary embolism without acute cor pulmonale: Secondary | ICD-10-CM

## 2015-12-15 NOTE — Progress Notes (Signed)
Call placed to [facility] to get name of participating gastroenterologist.   Unable to get #.  Patient also calls and gets a # for Dr Sandria SenterJoseph Hollis [phone].  Appointment made for Thuesday Jan 17 @ 10:30.

## 2015-12-15 NOTE — Progress Notes (Signed)
[facility]  Pulmonary, Critical Care, and Sleep Medicine    Dear Cynthia Marsh,    Chief complaint:  Pulmonary embolus    HPI:  Cynthia Marsh is 65 years old and comes to the office at your request concerning a pulmonary embolus that occurred about 6 weeks ago.  The patient said she experienced sudden substernal chest pain and was evaluated and diagnosed with a pulmonary embolus and no source was discovered.  The patient denied dizziness or shortness of breath or significant leg pain or swelling.  She says now she has no shortness of breath but she does have a vague aching chest discomfort which is substernal and nonpleuritic as well as pain at the front of her clavicles.  Allergies   Allergen Reactions   ??? 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   ??? Almond Other (comments)     Tongue itches   ??? Carvedilol Other (comments)   ??? Cozaar [Losartan] Cough   ??? Crestor [Rosuvastatin] Unknown (comments)     Heart beats fast     Current Outpatient Prescriptions   Medication Sig   ??? HYDROmorphone (DILAUDID) 2 mg tablet Take  by mouth every four (4) hours as needed for Pain.   ??? oxyCODONE-acetaminophen (PERCOCET 10) 10-325 mg per tablet Take 1 Tab by mouth every six (6) hours as needed for Pain. Max Daily Amount: 4 Tabs.   ??? ALPRAZolam (XANAX) 0.5 mg tablet TAKE 1 TABLET BY MOUTH TWICE DAILY. MAX DAILY AMOUNT: 1MG    ??? oxybutynin chloride XL (DITROPAN XL) 10 mg CR tablet Take 1 Tab by mouth daily.   ??? benzonatate (TESSALON) 100 mg capsule Take 1 Cap by mouth three (3) times daily as needed for Cough for up to 7 days.   ??? beclomethasone (QVAR) 80 mcg/actuation inhaler Take 1 Puff by inhalation two (2) times a day.   ??? rivaroxaban (XARELTO) 20 mg tab tablet Take 1 Tab by mouth daily.   ??? cyclobenzaprine (FLEXERIL) 10 mg tablet Take 0.5-1 Tabs by mouth three (3) times daily as needed for Muscle Spasm(s).   ??? LYRICA 50 mg capsule TAKE ONE CAPSULE BY MOUTH THREE TIMES DAILY. MAX  DAILY AMOUNT: 150MG    ??? montelukast (SINGULAIR) 10 mg tablet TAKE 1 TABLET BY MOUTH DAILY   ??? budesonide (PULMICORT) 180 mcg/actuation aepb inhaler Take 2 Puffs by inhalation two (2) times a day.   ??? albuterol (PROVENTIL HFA, VENTOLIN HFA, PROAIR HFA) 90 mcg/actuation inhaler Take 2 Puffs by inhalation every six (6) hours as needed for Shortness of Breath.   ??? amLODIPine (NORVASC) 10 mg tablet TAKE 1 TABLET BY MOUTH DAILY   ??? ramipril (ALTACE) 5 mg capsule Take 1 Cap by mouth daily.   ??? fexofenadine (ALLEGRA) 180 mg tablet Take 180 mg by mouth daily.   ??? EPINEPHrine (EPIPEN) 0.3 mg/0.3 mL injection 0.3 mg by IntraMUSCular route once as needed for Allergic Response.   ??? azelastine (ASTELIN) 137 mcg (0.1 %) nasal spray 2 Sprays by Both Nostrils route two (2) times a day.   ??? pravastatin (PRAVACHOL) 40 mg tablet Take 1 Tab by mouth nightly.   ??? topiramate (TOPAMAX) 25 mg tablet 1 tab daily for 1 week then BID   ??? mometasone (NASONEX) 50 mcg/actuation nasal spray 2 Sprays by Both Nostrils route daily.   ??? metFORMIN (GLUCOPHAGE) 1,000 mg tablet TAKE 1 TABLET BY MOUTH TWICE DAILY WITH MEALS   ??? meclizine (ANTIVERT) 25 mg tablet  Take 1 Tab by mouth three (3) times daily as needed. Indications: VERTIGO   ??? mirtazapine (REMERON) 30 mg tablet Take 30 mg by mouth nightly.   ??? calcium citrate-vitamin d3 (CITRACAL+D) 315-200 mg-unit tab Take 1 Tab by mouth daily (with breakfast).   ??? busPIRone (BUSPAR) 15 mg tablet Take 15 mg by mouth three (3) times daily.   ??? zolpidem (AMBIEN) 10 mg tablet Take 10 mg by mouth nightly.   ??? FLUoxetine (PROZAC) 40 mg capsule Take 40 mg by mouth daily.   ??? hydrochlorothiazide (HYDRODIURIL) 25 mg tablet Take 25 mg by mouth daily.   ??? Lancets misc by Does Not Apply route.   ??? FREESTYLE LITE STRIPS strip USE TO CHECK BLOOD SUGAR TWICE DAILY   ??? FREESTYLE LANCETS 28 gauge misc CHECK BLOOD SUGAR TWICE DAILY     No current facility-administered medications for this visit.      Past Medical History    Diagnosis Date   ??? Anxiety    ??? Asthma    ??? Cardiac echocardiogram 11/07/2015     EF 60%.  No RWMA.  Gr 1 DDfx.  RVSP 30 mmHg.     ??? Cardiovascular lower extremity venous duplex 11/06/2015     No DVT bilaterally.   ??? Chronic kidney disease    ??? Depression    ??? Diabetes (HCC)    ??? Hypertension    ??? Insomnia    ??? Osteoporosis    ??? Pneumonia 2010   ??? Thromboembolus [facility])    she denies a history of coronary artery disease liver disease or ulcers tuberculosis or cancer  Past Surgical History   Procedure Laterality Date   ??? Pr anesth,surgery of shoulder     ??? Pr hand/finger surgery unlisted     ??? Pr laminectomy,cervical     ??? Hx cyst removal       calf    ??? Hx gyn       complete hyst    ??? Hx gyn       tubal ligation   ??? Hx wrist fracture tx     ??? Hx back surgery  2012     L4/L5/S1 fused in [place]   ??? Hx orthopaedic       Right shoulder rotator cuff    ??? Hx orthopaedic       Left Little toe hammer toe, left big toe bunion removed   ??? Hx orthopaedic       Right knee torn meniscus   ??? Hx orthopaedic       CTS bilateral , Left thumb trigger finger    ??? Pr breast surgery procedure unlisted        Left cyst removed    ??? Hx cyst incision and drainage Left    ??? Hx hysterectomy     ??? Hx oophorectomy        social history: Lifelong nonsmoker    Family History   Problem Relation Age of Onset   ??? Diabetes Mother    ??? Diabetes Brother    ??? Cancer Sister    ??? Cancer Maternal Grandmother    ??? Cancer Daughter        Review of systems:  She denies syncope focal muscle weakness or numbness trouble hearing trouble saying chronic abdominal pain melena blood in her stools dysuria hematuria rashes but has significant arthritic complaints.  She denies fever or chills or weight loss but has a poor appetite.  The patient also reports  for the last 7 or 8 months difficulty with swallowing with almost all the food she swallows and with pills. Also hx of possible witnessed sleep apnea    Physical Exam:  Visit Vitals    ??? BP 99/75 (BP 1 Location: Left arm, BP Patient Position: Sitting)   ??? Pulse 88   ??? Temp 98 ??F (36.7 ??C) (Oral)   ??? Resp 20   ??? Ht 5\' 4"  (1.626 m)   ??? Wt 83 kg (183 lb)   ??? SpO2 96%   ??? BMI 31.41 kg/m2       Well-developed well-nourished  HEENT: WNL  Lymph node exam: Supraclavicular or cervical lymph nodes negative  Chest: Equal symmetrical expansion no dullness to percussion no wheezes rales rubs  Heart: Regular rhythm no gallop or murmur no JVD no peripheral edema  Abdomen: Soft nontender no organomegaly mass  Skin: No rashes  Musculoskeletal: No acute joint inflammation or muscle wasting  Extremities: No cyanosis clubbing or calf tenderness  Neurological: Alert and oriented    LABS:  Review of CTA chest 11/06/15: No significant abnormalities of the lung parenchyma pulmonary emboli noted by radiologist  O2 sat room air at rest 96%    Impression:   Pulmonary emboli with no etiology such as no recent surgery trauma or history of recurrent thrombosis or family history.  Of consult concern would be the possibility of a malignancy but the patient has had a hysterectomy and has frequent mammograms so this would be unlikely.  She does however have a significant history of recent dysphagia along with unusual anterior chest wall and clavicle discomfort and therefore it would be reasonable to rule out esophageal malignancy    Plan:  Screen for abnormal clotting  Refer to gastroenterology for evaluation of esophagus  Continues Xarelto possibly lifelong but certainly for at least 6 months  eval for possible OSA in future  Followup in 3 months or sooner if required    Thanks for allowing me to share in this patient's evaluation    Sincerely,    Lamont Snowball, MD , [facility]    CC: Marvia Pickles, MD     [address] [place], [place] [address]     P: [phone]     F: [phone]

## 2015-12-15 NOTE — Patient Instructions (Signed)
Call for symptoms such as unexplained shortness of breath dizziness or chest discomfort  Notify the office if there is a problem obtaining a consultation with the gastroenterologist

## 2015-12-16 ENCOUNTER — Encounter

## 2015-12-17 ENCOUNTER — Ambulatory Visit: Admit: 2015-12-17 | Discharge: 2015-12-17 | Payer: MEDICARE | Attending: Physician Assistant | Primary: Family Medicine

## 2015-12-17 DIAGNOSIS — M1711 Unilateral primary osteoarthritis, right knee: Secondary | ICD-10-CM

## 2015-12-17 NOTE — Progress Notes (Signed)
Marland Kitchen   [facility]  [address]  [place] [address]  [phone]           Patient: Cynthia Marsh                MRN: [id number]       SSN: [id number]  Date of Birth: 1950-12-23        AGE: 65 y.o.        SEX: female  Body mass index is 31.21 kg/(m^2).    PCP: Marvia Pickles, MD  12/17/15      This office note has been dictated.      REVIEW OF SYSTEMS:  Constitutional: Negative for fever, chills, weight loss and malaise/fatigue.   HENT: Negative.    Eyes: Negative.    Respiratory: Negative.   Cardiovascular: Negative.   Gastrointestinal: No bowel incontinence or constipation.  Genitourinary: No bladder incontinence or saddle anesthesia.  Skin: Negative.   Neurological: Negative.    Endo/Heme/Allergies: Negative.    Psychiatric/Behavioral: Negative.  Musculoskeletal: As per HPI above.     Past Medical History   Diagnosis Date   ??? Anxiety    ??? Asthma    ??? Cardiac echocardiogram 11/07/2015     EF 60%.  No RWMA.  Gr 1 DDfx.  RVSP 30 mmHg.     ??? Cardiovascular lower extremity venous duplex 11/06/2015     No DVT bilaterally.   ??? Chronic kidney disease    ??? Depression    ??? Diabetes (HCC)    ??? Hypertension    ??? Insomnia    ??? Osteoporosis    ??? Pneumonia 2010   ??? Thromboembolus (HCC)          Current Outpatient Prescriptions:   ???  HYDROmorphone (DILAUDID) 2 mg tablet, Take  by mouth every four (4) hours as needed for Pain., Disp: , Rfl:   ???  oxyCODONE-acetaminophen (PERCOCET 10) 10-325 mg per tablet, Take 1 Tab by mouth every six (6) hours as needed for Pain. Max Daily Amount: 4 Tabs., Disp: 90 Tab, Rfl: 0  ???  ALPRAZolam (XANAX) 0.5 mg tablet, TAKE 1 TABLET BY MOUTH TWICE DAILY. MAX DAILY AMOUNT: 1MG , Disp: 30 Tab, Rfl: 0  ???  oxybutynin chloride XL (DITROPAN XL) 10 mg CR tablet, Take 1 Tab by mouth daily., Disp: 90 Tab, Rfl: 2  ???  beclomethasone (QVAR) 80 mcg/actuation inhaler, Take 1 Puff by inhalation two (2) times a day., Disp: , Rfl:    ???  rivaroxaban (XARELTO) 20 mg tab tablet, Take 1 Tab by mouth daily., Disp: 30 Tab, Rfl: 4  ???  cyclobenzaprine (FLEXERIL) 10 mg tablet, Take 0.5-1 Tabs by mouth three (3) times daily as needed for Muscle Spasm(s)., Disp: 90 Tab, Rfl: 0  ???  LYRICA 50 mg capsule, TAKE ONE CAPSULE BY MOUTH THREE TIMES DAILY. MAX DAILY AMOUNT: 150MG , Disp: 90 Cap, Rfl: 0  ???  montelukast (SINGULAIR) 10 mg tablet, TAKE 1 TABLET BY MOUTH DAILY, Disp: 90 Tab, Rfl: 0  ???  budesonide (PULMICORT) 180 mcg/actuation aepb inhaler, Take 2 Puffs by inhalation two (2) times a day., Disp: 1 Inhaler, Rfl: 5  ???  albuterol (PROVENTIL HFA, VENTOLIN HFA, PROAIR HFA) 90 mcg/actuation inhaler, Take 2 Puffs by inhalation every six (6) hours as needed for Shortness of Breath., Disp: 1 Inhaler, Rfl: 5  ???  amLODIPine (NORVASC) 10 mg tablet, TAKE 1 TABLET BY MOUTH DAILY, Disp: 90 Tab, Rfl: 0  ???  ramipril (ALTACE) 5 mg capsule, Take 1 Cap by mouth daily., Disp: 30 Cap, Rfl: 0  ???  fexofenadine (ALLEGRA) 180 mg tablet, Take 180 mg by mouth daily., Disp: , Rfl:   ???  EPINEPHrine (EPIPEN) 0.3 mg/0.3 mL injection, 0.3 mg by IntraMUSCular route once as needed for Allergic Response., Disp: , Rfl:   ???  azelastine (ASTELIN) 137 mcg (0.1 %) nasal spray, 2 Sprays by Both Nostrils route two (2) times a day., Disp: 1 Bottle, Rfl: 4  ???  pravastatin (PRAVACHOL) 40 mg tablet, Take 1 Tab by mouth nightly., Disp: 90 Tab, Rfl: 3  ???  Lancets misc, by Does Not Apply route., Disp: , Rfl:   ???  mometasone (NASONEX) 50 mcg/actuation nasal spray, 2 Sprays by Both Nostrils route daily., Disp: 1 Container, Rfl: 5  ???  FREESTYLE LITE STRIPS strip, USE TO CHECK BLOOD SUGAR TWICE DAILY, Disp: 150 Strip, Rfl: 5  ???  FREESTYLE LANCETS 28 gauge misc, CHECK BLOOD SUGAR TWICE DAILY, Disp: 300 Each, Rfl: 5  ???  metFORMIN (GLUCOPHAGE) 1,000 mg tablet, TAKE 1 TABLET BY MOUTH TWICE DAILY WITH MEALS, Disp: 180 Tab, Rfl: 3  ???  mirtazapine (REMERON) 30 mg tablet, Take 30 mg by mouth nightly., Disp: , Rfl:    ???  calcium citrate-vitamin d3 (CITRACAL+D) 315-200 mg-unit tab, Take 1 Tab by mouth daily (with breakfast)., Disp: , Rfl:   ???  busPIRone (BUSPAR) 15 mg tablet, Take 15 mg by mouth three (3) times daily., Disp: , Rfl: 4  ???  zolpidem (AMBIEN) 10 mg tablet, Take 10 mg by mouth nightly., Disp: , Rfl: 3  ???  FLUoxetine (PROZAC) 40 mg capsule, Take 40 mg by mouth daily., Disp: , Rfl: 4  ???  hydrochlorothiazide (HYDRODIURIL) 25 mg tablet, Take 25 mg by mouth daily., Disp: , Rfl: 4  ???  benzonatate (TESSALON) 100 mg capsule, Take 1 Cap by mouth three (3) times daily as needed for Cough for up to 7 days., Disp: 21 Cap, Rfl: 1  ???  topiramate (TOPAMAX) 25 mg tablet, 1 tab daily for 1 week then BID, Disp: 60 Tab, Rfl: 5  ???  meclizine (ANTIVERT) 25 mg tablet, Take 1 Tab by mouth three (3) times daily as needed. Indications: VERTIGO, Disp: 10 Tab, Rfl: 0    Allergies   Allergen Reactions   ??? 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   ??? Almond Other (comments)     Tongue itches   ??? Carvedilol Other (comments)   ??? Cozaar [Losartan] Cough   ??? Crestor [Rosuvastatin] Unknown (comments)     Heart beats fast       Social History     Social History   ??? Marital status: SINGLE     Spouse name: N/A   ??? Number of children: N/A   ??? Years of education: N/A     Occupational History   ??? Not on file.     Social History Main Topics   ??? Smoking status: Never Smoker   ??? Smokeless tobacco: Never Used   ??? Alcohol use Yes      Comment: on occasion   ??? Drug use: No   ??? Sexual activity: Not on file     Other Topics Concern   ??? Not on file     Social History Narrative       Past Surgical History   Procedure Laterality Date   ??? Pr anesth,surgery of shoulder     ???  Pr hand/finger surgery unlisted     ??? Pr laminectomy,cervical     ??? Hx cyst removal       calf    ??? Hx gyn       complete hyst    ??? Hx gyn       tubal ligation   ??? Hx wrist fracture tx     ??? Hx back surgery  2012     L4/L5/S1 fused in [place]   ??? Hx orthopaedic        Right shoulder rotator cuff    ??? Hx orthopaedic       Left Little toe hammer toe, left big toe bunion removed   ??? Hx orthopaedic       Right knee torn meniscus   ??? Hx orthopaedic       CTS bilateral , Left thumb trigger finger    ??? Pr breast surgery procedure unlisted        Left cyst removed    ??? Hx cyst incision and drainage Left    ??? Hx hysterectomy     ??? Hx oophorectomy                 We did see Ms. Belmares for follow-up in regards to her right knee. We had sent her for lab work, as well as an MRI. She presents today for reevaluation.  She has had a Cortisone injection for the knee, which has not helped her much. She does have some back problems.  She is on Percocet from her family doctor, as well as a neuropathic medicine, Neurontin.      In regards to the knee, she does report her pain is a 10 out of 10.  She has pain on the outside part of her knee. She has had no locking or giving way. She does have pain with increased ambulation, as well as at rest. She has some trouble sleeping at night.  She has had no recent fevers, chills, systemic changes, or injuries to report.  She denies chest pain or shortness of breath.    PHYSICAL EXAMINATION: In general the patient is alert and oriented x 3 and is in no acute distress.  The patient is well-developed and well-nourished with a normal affect. The patient is afebrile.  HEENT:  Head is normocephalic and atraumatic.  Pupils are equally round and reactive to light and accommodation.  Extraocular eye movements are intact.  Neck is supple.  Trachea is midline. No JVD is present.  Breathing is nonlabored.  Examination of the lower extremities reveals pain free range of motion of the hips. There is no pain with palpation to the trochanteric bursae.  There is negative straight leg raise. There is negative calf tenderness and swelling.  There is negative Homans.  There is no evidence of DVT noted.  Examination of the right knee reveals the skin to be intact. There  is no erythema or ecchymosis. There is no warmth or signs for infection or cellulitis.  There is pain with palpation to the lateral joint line, as well as patellofemoral grind and some mild crepitus anteriorly with range of motion activities.      RADIOGRAPHS:  Review of previous radiographs reveals a valgus deformity present to the right knee with near bone-on-bone eburnation of the lateral compartment.      Review of the MRI reveals pronounced lateral joint line osteoarthritis with complex meniscal tearing.      LABORATORY DATA:  Review of labs  reveals a CBC WBC of 4.0, uric acid of 3.7, C-reactive protein of less than 0.3, negative Lyme???s titer, negative ANA, and sedimentation rate of 35.     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      PLAN:  At this point the patient is really not having any mechanical symptoms.  We are going to get her preapproved for a series of viscosupplementation to try to maximize her nonoperative treatment.  She is heading towards knee replacement on the right side. She does have a hinged brace that she has just been fitted for.  We will see her back in the office once the injections are preapproved again to try to maximize her nonoperative treatment.  She will call with any questions or concerns that shall arise.               JR Wednesday Ericsson MPAS, PA-C, ATC

## 2015-12-17 NOTE — Progress Notes (Signed)
Chief Complaint   Patient presents with   ??? Knee Pain     Right     10/10 pain.

## 2015-12-18 NOTE — Telephone Encounter (Signed)
Patient contacted and told this.

## 2015-12-18 NOTE — Telephone Encounter (Signed)
Pt getting percocet from pcp  Continue getting from pcp as well as contact pcp for fluid pills

## 2015-12-18 NOTE — Telephone Encounter (Signed)
Lab results were reported in office visit on 12-17-15

## 2015-12-18 NOTE — Telephone Encounter (Signed)
Please find results of labs done at [facility]

## 2015-12-18 NOTE — Telephone Encounter (Signed)
PT CALLING IN STATES THAT JTM IS SUPPOSED TO CALL HER REGARDING BLOOD TEST RESULTS? PLEASE CALL PT AND ADVISE, THANK YOU.

## 2015-12-18 NOTE — Telephone Encounter (Signed)
Pt called and states that she would like to know if she can be prescribed something for her swelling and something for her pain as well. Please call pt with further information at [phone] when [facility] are [facility].

## 2015-12-22 NOTE — Telephone Encounter (Signed)
Attempted to call pt twice

## 2015-12-22 NOTE — Telephone Encounter (Signed)
Please call pt with results from labs reported in my last office note

## 2015-12-22 NOTE — Telephone Encounter (Signed)
PATIENT CALLED AGAIN STATING THAT SHE NEVER RECEIVED THE TEST RESULTS WHEN SHE SAW PA MILLER ON 12/17/2015.    PATIENT IS REQUESTING A  CALL BACK   AT TEL. [phone].

## 2015-12-23 NOTE — Telephone Encounter (Signed)
Patient advised of lab results as JR dictated in office note. She will await approval of injections

## 2015-12-23 NOTE — Telephone Encounter (Signed)
Patient advised to make a f/u appt.

## 2015-12-23 NOTE — Telephone Encounter (Signed)
Patient called back - said someone called her and asked her to schedule appointment with [place] but she didn't know why.  Does this have anything to do with the injection request?  No notes in CC, patient is confused and doesn't remember who called her.  Please advise patient at [phone]

## 2015-12-23 NOTE — Telephone Encounter (Signed)
PATIENT RETURNED CALL.  SHE CAN BE REACHED AT [phone]

## 2015-12-23 NOTE — Telephone Encounter (Signed)
Patient called with fax number for [facility] to authorize visco injections.  Please fax order to them at [phone] for authorization.

## 2015-12-26 LAB — HYPERCOAGULATION PROFILE
Antithrombin Activity, Plasma: 109 %
Antithrombin Activity, plasma: 109 %
PROTEIN S ACTIVITY (CLOTTABLE) 500606: 153 % — ABNORMAL HIGH
Protein C Activity (Chromogenic): 110 %
Protein C Activity (Chromogenic): 110 %
Protein S activity, clottable: 153 % — ABNORMAL HIGH

## 2015-12-28 ENCOUNTER — Telehealth

## 2015-12-28 NOTE — Telephone Encounter (Signed)
The patient was given the message.

## 2015-12-28 NOTE — Telephone Encounter (Signed)
Let pt know they need to do some additional studies of her breast. Nothing to worry about at this time. This happens occasionally.

## 2015-12-29 NOTE — Telephone Encounter (Signed)
Is she no longer being seen at [facility]

## 2015-12-29 NOTE — Telephone Encounter (Signed)
Pt would like to be referred to a psychologist anywhere as long as they accept [employer].    Please call and advise.

## 2015-12-30 NOTE — Telephone Encounter (Signed)
Called the patient to ask her if she is no longer seeing a provider at [facility] but there was no answer.   So i left her a message requesting a return call.

## 2015-12-30 NOTE — Telephone Encounter (Signed)
Left message for patient to call the office.

## 2015-12-31 ENCOUNTER — Telehealth

## 2015-12-31 NOTE — Telephone Encounter (Signed)
Refer pt to [facility] for anxiety

## 2015-12-31 NOTE — Telephone Encounter (Signed)
Pt returned call re: Dr Lanae Crumbly question?  Is she no longer being seen at [facility]     Pt advised No she is no longer being seen there or any other office for psychiatry

## 2016-01-01 ENCOUNTER — Inpatient Hospital Stay: Admit: 2016-01-01 | Payer: PRIVATE HEALTH INSURANCE | Attending: Internal Medicine | Primary: Family Medicine

## 2016-01-01 ENCOUNTER — Encounter

## 2016-01-01 ENCOUNTER — Ambulatory Visit: Payer: PRIVATE HEALTH INSURANCE | Primary: Family Medicine

## 2016-01-01 DIAGNOSIS — N63 Unspecified lump in unspecified breast: Secondary | ICD-10-CM

## 2016-01-01 DIAGNOSIS — R928 Other abnormal and inconclusive findings on diagnostic imaging of breast: Secondary | ICD-10-CM

## 2016-01-01 NOTE — Telephone Encounter (Signed)
I called and spoke with the pt. Informed her that she was referred to [facility]. And they should contact her for an appointment.

## 2016-01-05 ENCOUNTER — Encounter

## 2016-01-05 NOTE — Telephone Encounter (Signed)
Requested Prescriptions     Pending Prescriptions Disp Refills   ??? ALPRAZolam (XANAX) 0.5 mg tablet 30 Tab 0     Sig: TAKE 1 TABLET BY MOUTH TWICE DAILY. MAX DAILY AMOUNT:    ??? ramipril (ALTACE) 5 mg capsule 30 Cap 0     Sig: Take 1 Cap by mouth daily.   ??? oxyCODONE-acetaminophen (PERCOCET 10) 10-325 mg per tablet 90 Tab 0     Sig: Take 1 Tab by mouth every six (6) hours as needed for Pain. Max Daily Amount: 4 Tabs.       Last office visit was  12/10/15  Next office visit is     03/10/16    Xanax 30 tabs prescribed 12/10/15  Percocet 90 tabs prescribed 12/10/15    Please assist.

## 2016-01-05 NOTE — Telephone Encounter (Signed)
Pt called in requesting refill of her   Requested Prescriptions     Pending Prescriptions Disp Refills   ??? ALPRAZolam (XANAX) 0.5 mg tablet 30 Tab 0     Sig: TAKE 1 TABLET BY MOUTH TWICE DAILY. MAX DAILY AMOUNT:    ??? ramipril (ALTACE) 5 mg capsule 30 Cap 0     Sig: Take 1 Cap by mouth daily.   ??? oxyCODONE-acetaminophen (PERCOCET 10) 10-325 mg per tablet 90 Tab 0     Sig: Take 1 Tab by mouth every six (6) hours as needed for Pain. Max Daily Amount: 4 Tabs.   Marland Kitchen

## 2016-01-06 MED ORDER — ALPRAZOLAM 0.5 MG TAB
0.5 mg | ORAL_TABLET | ORAL | 0 refills | Status: DC
Start: 2016-01-06 — End: 2016-01-11

## 2016-01-06 MED ORDER — OXYCODONE-ACETAMINOPHEN 10 MG-325 MG TAB
10-325 mg | ORAL_TABLET | Freq: Four times a day (QID) | ORAL | 0 refills | Status: DC | PRN
Start: 2016-01-06 — End: 2016-01-11

## 2016-01-06 MED ORDER — RAMIPRIL 5 MG CAP
5 mg | ORAL_CAPSULE | Freq: Every day | ORAL | 0 refills | Status: DC
Start: 2016-01-06 — End: 2016-01-11

## 2016-01-06 NOTE — Telephone Encounter (Signed)
Xanax and Percocet refilled and script printed. Ramipril refilled and sent to pharmacy. Please notify pt.

## 2016-01-06 NOTE — Telephone Encounter (Signed)
I called and spoke with the patient and informed the patient that her refill request was approved and script is available at the office. Patient verbalized understanding.

## 2016-01-08 ENCOUNTER — Ambulatory Visit: Admit: 2016-01-08 | Discharge: 2016-01-08 | Payer: MEDICARE | Attending: Physician Assistant | Primary: Family Medicine

## 2016-01-08 DIAGNOSIS — M1711 Unilateral primary osteoarthritis, right knee: Secondary | ICD-10-CM

## 2016-01-08 MED ORDER — SODIUM HYALURONATE 25 MG/2.5 ML (10 MG/ML) INTRA-ARTICULAR SYRINGE
10 mg/mL | Freq: Once | INTRA_ARTICULAR | 0 refills | Status: AC
Start: 2016-01-08 — End: 2016-01-08

## 2016-01-08 NOTE — Progress Notes (Signed)
Chief Complaint   Patient presents with   ??? Knee Pain     Right     4/10 pain.

## 2016-01-08 NOTE — Progress Notes (Signed)
[facility]  [address]  [place] [address]  [phone]           Patient: Cynthia Marsh                MRN: [id number]       SSN: [id number]  Date of Birth: 1951-04-13        AGE: 65 y.o.        SEX: female  Body mass index is 31.07 kg/(m^2).    PCP: Marvia Pickles, MD  01/08/16      This office note has been dictated.      REVIEW OF SYSTEMS:  Constitutional: Negative for fever, chills, weight loss and malaise/fatigue.   HENT: Negative.    Eyes: Negative.    Respiratory: Negative.   Cardiovascular: Negative.   Gastrointestinal: No bowel incontinence or constipation.  Genitourinary: No bladder incontinence or saddle anesthesia.  Skin: Negative.   Neurological: Negative.    Endo/Heme/Allergies: Negative.    Psychiatric/Behavioral: Negative.  Musculoskeletal: As per HPI above.     Past Medical History   Diagnosis Date   ??? Anxiety    ??? Asthma    ??? 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 Cardiac echocardiogram 11/07/2015     EF 60%.  No RWMA.  Gr 1 DDfx.  RVSP 30 mmHg.     ??? Cardiovascular lower extremity venous duplex 11/06/2015     No DVT bilaterally.   ??? Chronic kidney disease    ??? Depression    ??? Diabetes (HCC)    ??? Hypertension    ??? Insomnia    ??? Osteoporosis    ??? Pneumonia 2010   ??? Thromboembolus [facility])          Current Outpatient Prescriptions:   ???  sodium hyaluronate (SUPARTZ FX/HYALGAN/GENIVSC) 10 mg/mL syrg injection, 2 mL by Intra artICUlar route once for 1 dose., Disp: 2 mL, Rfl: 0  ???  ALPRAZolam (XANAX) 0.5 mg tablet, TAKE 1 TABLET BY MOUTH TWICE DAILY. MAX DAILY AMOUNT: , Disp: 30 Tab, Rfl: 0  ???  ramipril (ALTACE) 5 mg capsule, Take 1 Cap by mouth daily., Disp: 30 Cap, Rfl: 0  ???  oxyCODONE-acetaminophen (PERCOCET 10) 10-325 mg per tablet, Take 1 Tab by mouth every six (6) hours as needed for Pain. Max Daily Amount: 4 Tabs., Disp: 90 Tab, Rfl: 0  ???  HYDROmorphone (DILAUDID) 2 mg tablet, Take  by mouth every four (4)  hours as needed for Pain., Disp: , Rfl:   ???  oxybutynin chloride XL (DITROPAN XL) 10 mg CR tablet, Take 1 Tab by mouth daily., Disp: 90 Tab, Rfl: 2  ???  beclomethasone (QVAR) 80 mcg/actuation inhaler, Take 1 Puff by inhalation two (2) times a day., Disp: , Rfl:   ???  rivaroxaban (XARELTO) 20 mg tab tablet, Take 1 Tab by mouth daily., Disp: 30 Tab, Rfl: 4  ???  LYRICA 50 mg capsule, TAKE ONE CAPSULE BY MOUTH THREE TIMES DAILY. MAX DAILY AMOUNT: , Disp: 90 Cap, Rfl: 0  ???  montelukast (SINGULAIR) 10 mg tablet, TAKE 1 TABLET BY MOUTH DAILY, Disp: 90 Tab, Rfl: 0  ???  budesonide (PULMICORT) 180 mcg/actuation aepb inhaler, Take 2 Puffs by inhalation two (2) times a day., Disp: 1 Inhaler, Rfl: 5  ???  albuterol (PROVENTIL HFA, VENTOLIN HFA, PROAIR HFA) 90 mcg/actuation inhaler, Take 2 Puffs by inhalation every six (6) hours as needed for Shortness  of Breath., Disp: 1 Inhaler, Rfl: 5  ???  amLODIPine (NORVASC) 10 mg tablet, TAKE 1 TABLET BY MOUTH DAILY, Disp: 90 Tab, Rfl: 0  ???  fexofenadine (ALLEGRA) 180 mg tablet, Take 180 mg by mouth daily., Disp: , Rfl:   ???  EPINEPHrine (EPIPEN) 0.3 mg/0.3 mL injection, 0.3 mg by IntraMUSCular route once as needed for Allergic Response., Disp: , Rfl:   ???  azelastine (ASTELIN) 137 mcg (0.1 %) nasal spray, 2 Sprays by Both Nostrils route two (2) times a day., Disp: 1 Bottle, Rfl: 4  ???  pravastatin (PRAVACHOL) 40 mg tablet, Take 1 Tab by mouth nightly., Disp: 90 Tab, Rfl: 3  ???  Lancets misc, by Does Not Apply route., Disp: , Rfl:   ???  topiramate (TOPAMAX) 25 mg tablet, 1 tab daily for 1 week then BID, Disp: 60 Tab, Rfl: 5  ???  mometasone (NASONEX) 50 mcg/actuation nasal spray, 2 Sprays by Both Nostrils route daily., Disp: 1 Container, Rfl: 5  ???  FREESTYLE LITE STRIPS strip, USE TO CHECK BLOOD SUGAR TWICE DAILY, Disp: 150 Strip, Rfl: 5  ???  FREESTYLE LANCETS 28 gauge misc, CHECK BLOOD SUGAR TWICE DAILY, Disp: 300 Each, Rfl: 5  ???  metFORMIN (GLUCOPHAGE) 1,000 mg tablet, TAKE 1 TABLET BY MOUTH TWICE  DAILY WITH MEALS, Disp: 180 Tab, Rfl: 3  ???  meclizine (ANTIVERT) 25 mg tablet, Take 1 Tab by mouth three (3) times daily as needed. Indications: VERTIGO, Disp: 10 Tab, Rfl: 0  ???  mirtazapine (REMERON) 30 mg tablet, Take 30 mg by mouth nightly., Disp: , Rfl:   ???  calcium citrate-vitamin d3 (CITRACAL+D) 315-200 mg-unit tab, Take 1 Tab by mouth daily (with breakfast)., Disp: , Rfl:   ???  busPIRone (BUSPAR) 15 mg tablet, Take 15 mg by mouth three (3) times daily., Disp: , Rfl: 4  ???  zolpidem (AMBIEN) 10 mg tablet, Take 10 mg by mouth nightly., Disp: , Rfl: 3  ???  FLUoxetine (PROZAC) 40 mg capsule, Take 40 mg by mouth daily., Disp: , Rfl: 4  ???  hydrochlorothiazide (HYDRODIURIL) 25 mg tablet, Take 25 mg by mouth daily., Disp: , Rfl: 4  ???  cyclobenzaprine (FLEXERIL) 10 mg tablet, Take 0.5-1 Tabs by mouth three (3) times daily as needed for Muscle Spasm(s)., Disp: 90 Tab, Rfl: 0    Allergies   Allergen Reactions   ??? 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   ??? Almond Other (comments)     Tongue itches   ??? Carvedilol Other (comments)   ??? Cozaar [Losartan] Cough   ??? Crestor [Rosuvastatin] Unknown (comments)     Heart beats fast       Social History     Social History   ??? Marital status: SINGLE     Spouse name: N/A   ??? Number of children: N/A   ??? Years of education: N/A     Occupational History   ??? Not on file.     Social History Main Topics   ??? Smoking status: Never Smoker   ??? Smokeless tobacco: Never Used   ??? Alcohol use Yes      Comment: on occasion   ??? Drug use: No   ??? Sexual activity: Not on file     Other Topics Concern   ??? Not on file     Social History Narrative       Past Surgical History   Procedure Laterality Date   ???  Pr anesth,surgery of shoulder     ??? Pr hand/finger surgery unlisted     ??? Pr laminectomy,cervical     ??? Hx cyst removal       calf    ??? Hx gyn       complete hyst    ??? Hx gyn       tubal ligation   ??? Hx wrist fracture tx     ??? Hx back surgery  2012     L4/L5/S1 fused in [place]    ??? Hx orthopaedic       Right shoulder rotator cuff    ??? Hx orthopaedic       Left Little toe hammer toe, left big toe bunion removed   ??? Hx orthopaedic       Right knee torn meniscus   ??? Hx orthopaedic       CTS bilateral , Left thumb trigger finger    ??? Pr breast surgery procedure unlisted        Left cyst removed    ??? Hx cyst incision and drainage Left    ??? Hx hysterectomy     ??? Hx oophorectomy             * Patient was identified by name and date of birth   * Agreement on procedure being performed was verified  * Risks and Benefits explained to the patient  * Procedure site verified and marked as necessary  * Patient was positioned for comfort  * Consent was signed and verified  4:21 PM    The patient was instructed on post injection care.            We did see Ms. Kissner for follow-up in regards to her right knee. The patient has worsening discomfort in the right knee. She presents today for follow-up and continued evaluation and treatment options. She had a Cortisone injection, which gave her moderate relief.  She still has discomfort with ambulation. She has decreased walking tolerance, pain with stairs, pain with getting up from a chair, and pain going up and down stairs.  She has some occasional night pain. She has had no locking or giving way at this point.      PHYSICAL EXAMINATION: In general the patient is alert and oriented x 3 and is in no acute distress.  The patient is well-developed and well-nourished with a normal affect. The patient is afebrile.  HEENT:  Head is normocephalic and atraumatic.  Pupils are equally round and reactive to light and accommodation.  Extraocular eye movements are intact.  Neck is supple.  Trachea is midline. No JVD is present.  Breathing is nonlabored.  Examination of the lower extremities reveals pain free range of motion of the hips.  There is no pain with palpation to the trochanteric bursae.  There is negative straight leg raise. There is negative calf tenderness  and swelling.  There is negative Homans.  There is no evidence of DVT noted. The right knee reveals the skin to be intact.  There is no erythema or ecchymosis. There is no warmth or signs for infection or cellulitis. There is discomfort with palpation to the medial joint line, as well as patellofemoral grind and crepitus anteriorly with range of motion activities.     ASSESSMENT:  Right knee osteoarthritis.     PLAN:  At this point we are going to maximize her nonoperative treatment and move forward with an injection of viscosupplementation into the right knee  today.  After informed and written consent the right knee was prepped with Betadine and the Euflexxa injection was placed into the right knee without complications.   The patient tolerated the injection well. She was instructed on post injection care. We will see her back in the office next week for her second injection.                       JR Jesiah Grismer MPAS, PA-C, ATC

## 2016-01-11 ENCOUNTER — Ambulatory Visit: Attending: Physical Medicine & Rehabilitation | Primary: Family Medicine

## 2016-01-11 ENCOUNTER — Ambulatory Visit: Admit: 2016-01-11 | Payer: MEDICARE | Attending: Physical Medicine & Rehabilitation | Primary: Family Medicine

## 2016-01-11 ENCOUNTER — Encounter

## 2016-01-11 DIAGNOSIS — G8929 Other chronic pain: Secondary | ICD-10-CM

## 2016-01-11 DIAGNOSIS — M545 Low back pain, unspecified: Secondary | ICD-10-CM

## 2016-01-11 MED ORDER — CYCLOBENZAPRINE 10 MG TAB
10 mg | ORAL_TABLET | ORAL | 2 refills | Status: DC
Start: 2016-01-11 — End: 2016-02-02

## 2016-01-11 NOTE — Patient Instructions (Addendum)
Back Care and Preventing Injuries: Care Instructions  Your Care Instructions  You can hurt your back doing many everyday activities: lifting a heavy box, bending down to garden, exercising at the gym, and even getting out of bed. But you can keep your back strong and healthy by doing some exercises. You also can follow a few tips for sitting, sleeping, and lifting to avoid hurting your back again.  Talk to your doctor before you start an exercise program. Ask for help if you want to learn more about keeping your back healthy.  Follow-up care is a key part of your treatment and safety. Be sure to make and go to all appointments, and call your doctor if you are having problems. It's also a good idea to know your test results and keep a list of the medicines you take.  How can you care for yourself at home?  ?? Stay at a healthy weight to avoid strain on your lower back.  ?? Do not smoke. Smoking increases the risk of osteoporosis, which weakens the spine. If you need help quitting, talk to your doctor about stop-smoking programs and medicines. These can increase your chances of quitting for good.  ?? Make sure you sleep in a position that maintains your back's normal curves and on a mattress that feels comfortable. Sleep on your side with a pillow between your knees, or sleep on your back with a pillow under your knees. These positions can reduce strain on your back.  ?? When you get out of bed, lie on your side and bend both knees. Drop your feet over the edge of the bed as you push up with both arms. Scoot to the edge of the bed. Make sure your feet are in line with your rear end (buttocks), and then stand up.  ?? If you must stand for a long time, put one foot on a stool, ledge, or box.  Exercise to strengthen your back and other muscles  ?? Get at least 30 minutes of exercise on most days of the week. Walking is a good choice. You also may want to do other activities, such as running,  swimming, cycling, or playing tennis or team sports.  ?? Stretch your back muscles. Here are few exercises to try:  ?? Lie on your back with your knees bent and your feet flat on the floor. Gently pull one bent knee to your chest. Put that foot back on the floor, and then pull the other knee to your chest. Hold for 15 to 30 seconds. Repeat 2 to 4 times.  ?? Do pelvic tilts. Lie on your back with your knees bent. Tighten your stomach muscles. Pull your belly button (navel) in and up toward your ribs. You should feel like your back is pressing to the floor and your hips and pelvis are slightly lifting off the floor. Hold for 6 seconds while breathing smoothly.  ?? Keep your core muscles strong. The muscles of your back, belly (abdomen), and buttocks support your spine.  ?? Pull in your belly, and imagine pulling your navel toward your spine. Hold this for 6 seconds, then relax. Remember to keep breathing normally as you tense your muscles.  ?? Do curl-ups. Always do them with your knees bent. Keep your low back on the floor, and curl your shoulders toward your knees using a smooth, slow motion. Keep your arms folded across your chest. If this bothers your neck, try putting your hands behind your neck (not   your head), with your elbows spread apart.  ?? Lie on your back with your knees bent and your feet flat on the floor. Tighten your belly muscles, and then push with your feet and raise your buttocks up a few inches. Hold this position 6 seconds as you continue to breathe normally, then lower yourself slowly to the floor. Repeat 8 to 12 times.  ?? If you like group exercise, try Pilates or yoga. These classes have poses that strengthen the core muscles.  Protect your back when you sit  ?? Place a small pillow, a rolled-up towel, or a lumbar roll in the curve of your back if you need extra support.  ?? Sit in a chair that is low enough to let you place both feet flat on the  floor with both knees nearly level with your hips. If your chair or desk is too high, use a foot rest to raise your knees.  ?? When driving, keep your knees nearly level with your hips. Sit straight, and drive with both hands on the steering wheel. Your arms should be in a slightly bent position.  ?? Try a kneeling chair, which helps tilt your hips forward. This takes pressure off your lower back.  ?? Try sitting on an exercise ball. It can rock from side to side, which helps keep your back loose.  Lift properly  ?? Squat down, bending at the hips and knees only. If you need to, put one knee to the floor and extend your other knee in front of you, bent at a right angle (half kneeling).  ?? Press your chest straight forward. This helps keep your upper back straight while keeping a slight arch in your low back.  ?? Hold the load as close to your body as possible, at the level of your navel.  ?? Use your feet to change direction, taking small steps.  ?? Lead with your hips as you change direction. Keep your shoulders in line with your hips as you move. Do not twist your body.  ?? Set down your load carefully, squatting with your knees and hips only.  When should you call for help?  Watch closely for changes in your health, and be sure to contact your doctor if:  ?? You want more exercises to make your back and other core muscles stronger.  Where can you learn more?  Go to http://www.healthwise.net/[employer].  Enter S810 in the search box to learn more about "Back Care and Preventing Injuries: Care Instructions."  Current as of: Apr 27, 2015  Content Version: 11.1  ?? 2006-2016 [employer]. Care instructions adapted under license by [employer] (which disclaims liability or warranty for this information). If you have questions about a medical condition or this instruction, always ask your healthcare professional. Healthwise, Incorporated disclaims any warranty or liability for your use of this  information.

## 2016-01-11 NOTE — Progress Notes (Signed)
[facility]  [address], [address]  [place], [place] [address]  Phone: [phone]  Fax: [phone]        Cynthia, Marsh  DOB: 10-27-1951  PCP: Cynthia Pickles, MD    PROGRESS NOTE      ASSESSMENT AND PLAN    [name] was seen today for back pain.    Diagnoses and all orders for this visit:    Chronic bilateral low back pain without sciatica    History of lumbar fusion, L4-sacrum    Other orders  -     cyclobenzaprine (FLEXERIL) 10 mg tablet; 1/2 tab PO every day-BID PRN pain    1. Continue Flexeril prn   2. No indications for surgery or injections at this time.   3. May receive Flexeril refills from PCP in future.   4. Released from specialty care to PCP.     Follow-up Disposition:  Return if symptoms worsen or fail to improve.      HISTORY OF PRESENT ILLNESS  Cynthia Marsh is a 65 y.o. female. Pt presents to the office for a f/u visit for low back pain. Pt is fused from L4 to the sacrum.     Pt continues to have back pain  mostly felt on the left side with prolonged sitting. She has been unable to go on long trips due to this pain. Her pain will radiate into her LLE at times. Flexeril 5mg  helpful, no side effects. Taking PRN.    Pt wears a RT knee brace due to her knee pain, she is supposed to have knee surgery. She is unable to have surgery at this time due UE DVT.Marland Kitchen No paresthesia or weakness in her BLE. No saddle paresthesia. She reports benefit of Flexeril 5 mg qD. No longer taking Naprosyn. Pt is on Xarelto due to her blood clot. Denies persistent fevers, chills, weight changes, neurogenic bowel or bladder symptoms. Pt denies recent ED visits or hospitalizations.       Pain Assessment  01/11/2016   Location of Pain Back   Location Modifiers -   Severity of Pain 8   Quality of Pain Aching;Throbbing   Duration of Pain -   Frequency of Pain Constant   Aggravating Factors Walking;Standing;Stairs;Kneeling   Limiting Behavior -   Relieving Factors (No Data)    Relieving Factors Comment laying down   Result of Injury -   Work-Related Injury -   Type of Injury -       PAST MEDICAL HISTORY   Past Medical History   Diagnosis Date   ??? Anxiety    ??? Asthma    ??? 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 Cardiac echocardiogram 11/07/2015     EF 60%.  No RWMA.  Gr 1 DDfx.  RVSP 30 mmHg.     ??? Cardiovascular lower extremity venous duplex 11/06/2015     No DVT bilaterally.   ??? Chronic kidney disease    ??? Depression    ??? Diabetes (HCC)    ??? Hypertension    ??? Insomnia    ??? Osteoporosis    ??? Pneumonia 2010   ??? Thromboembolus [facility])        Past Surgical History   Procedure Laterality Date   ??? Pr anesth,surgery of shoulder     ??? Pr hand/finger surgery unlisted     ??? Pr laminectomy,cervical     ??? Hx cyst removal  calf    ??? Hx gyn       complete hyst    ??? Hx gyn       tubal ligation   ??? Hx wrist fracture tx     ??? Hx back surgery  2012     L4/L5/S1 fused in [place]   ??? Hx orthopaedic       Right shoulder rotator cuff    ??? Hx orthopaedic       Left Little toe hammer toe, left big toe bunion removed   ??? Hx orthopaedic       Right knee torn meniscus   ??? Hx orthopaedic       CTS bilateral , Left thumb trigger finger    ??? Pr breast surgery procedure unlisted        Left cyst removed    ??? Hx cyst incision and drainage Left    ??? Hx hysterectomy     ??? Hx oophorectomy     .      MEDICATIONS    Current Outpatient Prescriptions   Medication Sig Dispense Refill   ??? naproxen (NAPROSYN) 500 mg tablet Take 500 mg by mouth.     ??? aspirin delayed-release 81 mg tablet Frequency:   Dosage:0.0     Instructions:  Note:     ??? diltiazem hcl 360 mg Tb24 Frequency:   Dosage:0.0     Instructions:  Note:     ??? HYDROcodone-acetaminophen (NORCO) 10-325 mg tablet TAKE 1 TABLET 3 to 4 times daily if needed for pain (105 tablets at most over 30 days)     ??? albuterol-ipratropium (DUO-NEB) 2.5 mg-0.5 mg/3 ml nebu Frequency:   Dosage:0.0     Instructions:  Note:      ??? ketotifen (ALAWAY) 0.025 % (0.035 %) ophthalmic solution Frequency:   Dosage:0.0     Instructions:  Note:     ??? simvastatin (ZOCOR) 20 mg tablet Frequency:   Dosage:0.0     Instructions:  Note:     ??? triamcinolone (ARISTOCORT) 0.5 % topical cream Frequency:   Dosage:0.0     Instructions:  Note:     ??? hydrOXYzine pamoate (VISTARIL) 25 mg capsule Take 25 mg by mouth.     ??? dicyclomine (BENTYL) 20 mg tablet Take 10-20 mg by mouth.     ??? dilTIAZem (TIAZAC) 360 mg SR capsule Take 360 mg by mouth.     ??? hydrOXYzine HCl (ATARAX) 25 mg tablet Take 25-100 mg by mouth.     ??? simvastatin (ZOCOR) 40 mg tablet Take 40 mg by mouth.     ??? tiZANidine (ZANAFLEX) 2 mg tablet Take 2 mg by mouth.     ??? traZODone (DESYREL) 150 mg tablet Take 300 mg by mouth.     ??? diclofenac (VOLTAREN) 1 % gel Apply  to affected area.     ??? tiZANidine (ZANAFLEX) 2 mg capsule Take as directed.     ??? benzonatate (TESSALON) 100 mg capsule TK 1 C PO TID FOR UP TO 7 DAYS PRF COUGH  1   ??? chlorpheniramine-HYDROcodone (TUSSIONEX) 10-8 mg/5 mL suspension TK 5 ML PO Q 12 H PRF COUGH  0   ??? trimethoprim-polymyxin b (POLYTRIM) ophthalmic solution INT 1 GTT IN OD Q 4 H FOR 10 DAYS  0   ??? ALPRAZolam (XANAX) 0.5 mg tablet TAKE 1 TABLET BY MOUTH TWICE DAILY. MAX DAILY AMOUNT:  30 Tab 0   ??? ramipril (ALTACE) 5 mg capsule Take 1 Cap by mouth  daily. 30 Cap 0   ??? oxyCODONE-acetaminophen (PERCOCET 10) 10-325 mg per tablet Take 1 Tab by mouth every six (6) hours as needed for Pain. Max Daily Amount: 4 Tabs. 90 Tab 0   ??? HYDROmorphone (DILAUDID) 2 mg tablet Take  by mouth every four (4) hours as needed for Pain.     ??? oxybutynin chloride XL (DITROPAN XL) 10 mg CR tablet Take 1 Tab by mouth daily. 90 Tab 2   ??? beclomethasone (QVAR) 80 mcg/actuation inhaler Take 1 Puff by inhalation two (2) times a day.     ??? rivaroxaban (XARELTO) 20 mg tab tablet Take 1 Tab by mouth daily. 30 Tab 4   ??? cyclobenzaprine (FLEXERIL) 10 mg tablet Take 0.5-1 Tabs by mouth three  (3) times daily as needed for Muscle Spasm(s). 90 Tab 0   ??? LYRICA 50 mg capsule TAKE ONE CAPSULE BY MOUTH THREE TIMES DAILY. MAX DAILY AMOUNT: 150MG  90 Cap 0   ??? montelukast (SINGULAIR) 10 mg tablet TAKE 1 TABLET BY MOUTH DAILY 90 Tab 0   ??? budesonide (PULMICORT) 180 mcg/actuation aepb inhaler Take 2 Puffs by inhalation two (2) times a day. 1 Inhaler 5   ??? albuterol (PROVENTIL HFA, VENTOLIN HFA, PROAIR HFA) 90 mcg/actuation inhaler Take 2 Puffs by inhalation every six (6) hours as needed for Shortness of Breath. 1 Inhaler 5   ??? amLODIPine (NORVASC) 10 mg tablet TAKE 1 TABLET BY MOUTH DAILY 90 Tab 0   ??? fexofenadine (ALLEGRA) 180 mg tablet Take 180 mg by mouth daily.     ??? EPINEPHrine (EPIPEN) 0.3 mg/0.3 mL injection 0.3 mg by IntraMUSCular route once as needed for Allergic Response.     ??? azelastine (ASTELIN) 137 mcg (0.1 %) nasal spray 2 Sprays by Both Nostrils route two (2) times a day. 1 Bottle 4   ??? pravastatin (PRAVACHOL) 40 mg tablet Take 1 Tab by mouth nightly. 90 Tab 3   ??? Lancets misc by Does Not Apply route.     ??? topiramate (TOPAMAX) 25 mg tablet 1 tab daily for 1 week then BID 60 Tab 5   ??? mometasone (NASONEX) 50 mcg/actuation nasal spray 2 Sprays by Both Nostrils route daily. 1 Container 5   ??? FREESTYLE LITE STRIPS strip USE TO CHECK BLOOD SUGAR TWICE DAILY 150 Strip 5   ??? FREESTYLE LANCETS 28 gauge misc CHECK BLOOD SUGAR TWICE DAILY 300 Each 5   ??? metFORMIN (GLUCOPHAGE) 1,000 mg tablet TAKE 1 TABLET BY MOUTH TWICE DAILY WITH MEALS 180 Tab 3   ??? meclizine (ANTIVERT) 25 mg tablet Take 1 Tab by mouth three (3) times daily as needed. Indications: VERTIGO 10 Tab 0   ??? mirtazapine (REMERON) 30 mg tablet Take 30 mg by mouth nightly.     ??? calcium citrate-vitamin d3 (CITRACAL+D) 315-200 mg-unit tab Take 1 Tab by mouth daily (with breakfast).     ??? busPIRone (BUSPAR) 15 mg tablet Take 15 mg by mouth three (3) times daily.  4   ??? zolpidem (AMBIEN) 10 mg tablet Take 10 mg by mouth nightly.  3    ??? FLUoxetine (PROZAC) 40 mg capsule Take 40 mg by mouth daily.  4   ??? hydrochlorothiazide (HYDRODIURIL) 25 mg tablet Take 25 mg by mouth daily.  4        ALLERGIES  Allergies   Allergen Reactions   ??? 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   ??? Almond Other (comments)  Tongue itches   ??? Carvedilol Other (comments)   ??? Cozaar [Losartan] Cough   ??? Crestor [Rosuvastatin] Unknown (comments)     Heart beats fast          SOCIAL HISTORY    Social History     Social History   ??? Marital status: SINGLE     Spouse name: N/A   ??? Number of children: N/A   ??? Years of education: N/A     Occupational History   ??? Not on file.     Social History Main Topics   ??? Smoking status: Never Smoker   ??? Smokeless tobacco: Never Used   ??? Alcohol use Yes      Comment: on occasion   ??? Drug use: No   ??? Sexual activity: Not on file     Other Topics Concern   ??? Not on file     Social History Narrative       FAMILY HISTORY  Family History   Problem Relation Age of Onset   ??? Diabetes Mother    ??? Diabetes Brother    ??? Cancer Sister    ??? Cancer Maternal Grandmother    ??? Cancer Daughter        REVIEW OF SYSTEMS  Review of Systems   Constitutional: Negative for chills, fever and weight loss.   Respiratory: Negative for shortness of breath.    Cardiovascular: Negative for chest pain.   Gastrointestinal: Negative for constipation.        Negative for fecal incontinence   Genitourinary: Negative for dysuria.        Negative for urinary incontinence   Musculoskeletal:        Per HPI   Skin: Negative for rash.   Neurological: Negative for dizziness, tingling, tremors, focal weakness and headaches.   Endo/Heme/Allergies: Does not bruise/bleed easily.   Psychiatric/Behavioral: The patient does not have insomnia.        PHYSICAL EXAMINATION  Visit Vitals   ??? BP 127/90   ??? Pulse 75   ??? Temp 98.2 ??F (36.8 ??C) (Oral)   ??? Resp 18   ??? Ht  (1.626 m)   ??? Wt 170 lb 12.8 oz (77.5 kg)   ??? SpO2 97%   ??? BMI 29.32 kg/m2         Accompanied by self.       Constitutional:  Well developed, well nourished, in no acute distress.   Psychiatric: Affect and mood are appropriate.   Integumentary: No rashes or abrasions noted on exposed areas.    Cardiovascular/Peripheral Vascular: Intact l pulses. No peripheral edema is noted.  Lymphatic:  No evidence of lymphedema. No cervical lymphadenopathy.     SPINE/MUSCULOSKELETAL EXAM       Lumbar spine:  No rash, ecchymosis, or gross obliquity. No fasciculations. No focal atrophy is noted. Tenderness to palpation LT iliac crest. No tenderness to palpation at the sciatic notch. SI joints non-tender. Trochanters non tender.      Sensation grossly intact to light touch.      MOTOR:       Hip Flex  Quads Hamstrings Ankle DF EHL Ankle PF   Right +4/5 +4/5 +4/5 +4/5 +4/5 +4/5   Left +4/5 +4/5 +4/5 +4/5 +4/5 +4/5       Straight Leg raise negative.    Ambulation with walker. FWB.      Written by Arther Dames, [employer]ribe[employer], as dictated by Wynelle Beckmann, MD.    Cynthia Marsh may have  a reminder for a "due or due soon" health maintenance. I have asked that she contact her primary care provider for follow-up on this health maintenance.

## 2016-01-15 ENCOUNTER — Ambulatory Visit: Admit: 2016-01-15 | Discharge: 2016-01-15 | Payer: MEDICARE | Attending: Physician Assistant | Primary: Family Medicine

## 2016-01-15 DIAGNOSIS — M1711 Unilateral primary osteoarthritis, right knee: Secondary | ICD-10-CM

## 2016-01-15 MED ORDER — SODIUM HYALURONATE 25 MG/2.5 ML (10 MG/ML) INTRA-ARTICULAR SYRINGE
10 mg/mL | Freq: Once | INTRA_ARTICULAR | 0 refills | Status: AC
Start: 2016-01-15 — End: 2016-01-15

## 2016-01-15 NOTE — Telephone Encounter (Signed)
Patient called in and stated that the pain medication is not working for her back pain. Patient also stated that she is taking a muscle relaxer and that is not working. Patient wants to know what can she do about this. Please advise.

## 2016-01-15 NOTE — Telephone Encounter (Signed)
Pt will need office visit  to revaluate her pain meds

## 2016-01-15 NOTE — Progress Notes (Signed)
Patient: Cynthia Marsh                MRN: [id number]       SSN: [id number]  Date of Birth: November 17, 1951        AGE: 64 y.o.        SEX: female  Body mass index is 29.18 kg/(m^2).    PCP: Marvia Pickles, MD  01/15/16        Pt presents today for their 2nd euflexxa  injection for Right knee .  There has been recent fevers/chills, systemic changes, or injuries to report.    PE:  General A&Ox3, NAD  Exam of the knees reveals skin intact, no erythema, no ecchymosis, no signs for infection.  No cyanosis, clubbing, or edema present distally.  Neg calf tenderness, neg homans, no signs for DVT present.    A: Right knee OA    P: * Patient was identified by name and date of birth       * Agreement on procedure being performed was verified      * Risks and Benefits explained to the patient      * Procedure site verified and marked as necessary      * Patient was positioned for comfort      * Consent was signed and verified     2:46 PM      the Right was injected without complications.  Patient was instructed     on injection care.       Sharen Hones, PA-C

## 2016-01-15 NOTE — Telephone Encounter (Signed)
Called and left a message for the pt requesting that she call the office to schedule an appointment.  Per the provider "pt will need office visit to re-evaluate her pain meds."

## 2016-01-18 ENCOUNTER — Inpatient Hospital Stay
Admit: 2016-01-18 | Discharge: 2016-01-18 | Disposition: A | Discharge status: Home / Self Care | Payer: PRIVATE HEALTH INSURANCE | Attending: Emergency Medicine

## 2016-01-18 DIAGNOSIS — M25572 Pain in left ankle and joints of left foot: Secondary | ICD-10-CM

## 2016-01-18 LAB — D-DIMER, QUANTITATIVE: D-Dimer, Quant: <0.27 ug/ml(FEU) (ref <0.46)

## 2016-01-18 LAB — CBC WITH AUTOMATED DIFF
ABS. BASOPHILS: 0 10*3/uL (ref 0.0–0.06)
ABS. EOSINOPHILS: 0.1 10*3/uL (ref 0.0–0.4)
ABS. LYMPHOCYTES: 1.9 10*3/uL (ref 0.9–3.6)
ABS. MONOCYTES: 0.3 10*3/uL (ref 0.05–1.2)
ABS. NEUTROPHILS: 1.6 10*3/uL — ABNORMAL LOW (ref 1.8–8.0)
BASOPHILS: 1 % (ref 0–2)
EOSINOPHILS: 4 % (ref 0–5)
HCT: 37.6 % (ref 35.0–45.0)
HGB: 11.6 g/dL — ABNORMAL LOW (ref 12.0–16.0)
LYMPHOCYTES: 47 % (ref 21–52)
MCH: 26.1 PG (ref 24.0–34.0)
MCHC: 30.9 g/dL — ABNORMAL LOW (ref 31.0–37.0)
MCV: 84.5 FL (ref 74.0–97.0)
MONOCYTES: 8 % (ref 3–10)
MPV: 9.3 FL (ref 9.2–11.8)
NEUTROPHILS: 40 % (ref 40–73)
PLATELET: 310 10*3/uL (ref 135–420)
RBC: 4.45 M/uL (ref 4.20–5.30)
RDW: 15 % — ABNORMAL HIGH (ref 11.6–14.5)
WBC: 3.9 10*3/uL — ABNORMAL LOW (ref 4.6–13.2)

## 2016-01-18 LAB — METABOLIC PANEL, BASIC
Anion gap: 9 mmol/L (ref 3.0–18)
BUN/Creatinine ratio: 24 — ABNORMAL HIGH (ref 12–20)
BUN: 16 MG/DL (ref 7.0–18)
CO2: 29 mmol/L (ref 21–32)
Calcium: 9.2 MG/DL (ref 8.5–10.1)
Chloride: 103 mmol/L (ref 100–108)
Creatinine: 0.67 MG/DL (ref 0.6–1.3)
GFR est AA: >60 mL/min/{1.73_m2} (ref >60)
GFR est non-AA: >60 mL/min/{1.73_m2} (ref >60)
Glucose: 104 mg/dL — ABNORMAL HIGH (ref 74–99)
Potassium: 3.3 mmol/L — ABNORMAL LOW (ref 3.5–5.5)
Sodium: 141 mmol/L (ref 136–145)

## 2016-01-18 LAB — D DIMER: D DIMER: <0.27 ug/ml(FEU) (ref <0.46)

## 2016-01-18 NOTE — ED Triage Notes (Signed)
Woke up this morning and had sudden onset bilateral lower leg pain. Pain goes all the way up her legs, no pain now.

## 2016-01-18 NOTE — ED Provider Notes (Addendum)
HPI Comments: 11:23 AM. Cynthia Marsh is a 65 y.o. female with a history of asthma, CKD, DM, HTN, thromboembolus, and osteoporosis who presents to the ED c/o bilateral leg pain, which started today. She mentions that her right ankle was "aching" yesterday. She states that she was watching TV when she developed pain in her ankles, which radiated up her legs. She reports that the leg pain lasted a couple minutes and then went away after walking around. She notes that the leg pain resembled the pain she had from her blood clot approximately 2 months ago. She mentions that she is taking Xarelto. There are no other concerns at this time.      The history is provided by the patient.        Past Medical History:   Diagnosis Date   ??? Anxiety    ??? Asthma    ??? 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 Cardiac echocardiogram 11/07/2015     EF 60%.  No RWMA.  Gr 1 DDfx.  RVSP 30 mmHg.     ??? Cardiovascular lower extremity venous duplex 11/06/2015     No DVT bilaterally.   ??? Chronic kidney disease    ??? Depression    ??? Diabetes (HCC)    ??? Hypertension    ??? Insomnia    ??? Osteoporosis    ??? Pneumonia 2010   ??? Thromboembolus [facility])        Past Surgical History:   Procedure Laterality Date   ??? Pr anesth,surgery of shoulder     ??? Pr hand/finger surgery unlisted     ??? Pr laminectomy,cervical     ??? Hx cyst removal       calf    ??? Hx gyn       complete hyst    ??? Hx gyn       tubal ligation   ??? Hx wrist fracture tx     ??? Hx back surgery  2012     L4/L5/S1 fused in [place]   ??? Hx orthopaedic       Right shoulder rotator cuff    ??? Hx orthopaedic       Left Little toe hammer toe, left big toe bunion removed   ??? Hx orthopaedic       Right knee torn meniscus   ??? Hx orthopaedic       CTS bilateral , Left thumb trigger finger    ??? Pr breast surgery procedure unlisted        Left cyst removed    ??? Hx cyst incision and drainage Left    ??? Hx hysterectomy     ??? Hx oophorectomy           Family History:   Problem Relation Age of Onset    ??? Diabetes Mother    ??? Diabetes Brother    ??? Cancer Sister    ??? Cancer Maternal Grandmother    ??? Cancer Daughter        Social History     Social History   ??? Marital status: SINGLE     Spouse name: N/A   ??? Number of children: N/A   ??? Years of education: N/A     Occupational History   ??? Not on file.     Social History Main Topics   ??? Smoking status: Never Smoker   ??? Smokeless tobacco: Never Used   ??? Alcohol use Yes  Comment: on occasion   ??? Drug use: No   ??? Sexual activity: Not on file     Other Topics Concern   ??? Not on file     Social History Narrative         ALLERGIES: Nuts [tree nut]; Almond; Carvedilol; Cozaar [losartan]; and Crestor [rosuvastatin]    Review of Systems   Constitutional: Negative for chills, fatigue, fever and unexpected weight change.   HENT: Negative for congestion and rhinorrhea.    Respiratory: Negative for chest tightness and shortness of breath.    Cardiovascular: Negative for chest pain, palpitations and leg swelling.   Gastrointestinal: Negative for abdominal pain, nausea and vomiting.   Genitourinary: Negative for dysuria.   Musculoskeletal: Positive for myalgias (bilateral legs). Negative for back pain.   Skin: Negative for rash.   Neurological: Negative for dizziness and weakness.   Psychiatric/Behavioral: The patient is not nervous/anxious.        Vitals:    01/18/16 1100   BP: 108/73   Pulse: 81   Resp: 16   Temp: 98.2 ??F (36.8 ??C)   SpO2: 96%   Weight: 78 kg (172 lb)   Height:  (1.626 m)            Physical Exam   Constitutional: She is oriented to person, place, and time. She appears well-developed and well-nourished. No distress.   HENT:   Head: Normocephalic and atraumatic.   Right Ear: External ear normal.   Left Ear: External ear normal.   Nose: Nose normal.   Mouth/Throat: Oropharynx is clear and moist.   Eyes: Conjunctivae and EOM are normal. Pupils are equal, round, and reactive to light. No scleral icterus.    Neck: Normal range of motion. Neck supple. No JVD present. No tracheal deviation present. No thyromegaly present.   Cardiovascular: Normal rate, regular rhythm, normal heart sounds and intact distal pulses.  Exam reveals no gallop and no friction rub.    No murmur heard.  Pulmonary/Chest: Effort normal and breath sounds normal. She exhibits no tenderness.   Abdominal: Soft. Bowel sounds are normal. She exhibits no distension. There is no tenderness. There is no rebound and no guarding.   Musculoskeletal: Normal range of motion. She exhibits no edema or tenderness.   Lymphadenopathy:     She has no cervical adenopathy.   Neurological: She is alert and oriented to person, place, and time. No cranial nerve deficit. Coordination normal.   No sensory loss, Gait normal, Motor 5/5   Skin: Skin is warm and dry.   Psychiatric: She has a normal mood and affect. Her behavior is normal. Judgment and thought content normal.   Nursing note and vitals reviewed.       MDM  Number of Diagnoses or Management Options  Arthralgia of both lower legs:   Diagnosis management comments: Results reviewed with pt, she remains asymptomatic, she agrees with dispo and F/U plan.  Reuel Derby, MD  12:12 PM         Amount and/or Complexity of Data Reviewed  Clinical lab tests: ordered and reviewed      ED Course       Procedures      Vitals:  Patient Vitals for the past 12 hrs:   Temp Pulse Resp BP SpO2   01/18/16 1100 98.2 ??F (36.8 ??C) 81 16 108/73 96 %     96% on RA, indicating adequate oxygenation.    Medications ordered:   Medications - No data  to display      Lab findings:  No results found for this or any previous visit (from the past 12 hour(s)).      X-Ray, CT or other radiology findings or impressions:  No orders to display       Progress notes, Consult notes or additional Procedure notes:         Disposition:  Diagnosis: No diagnosis found.    Disposition:     Follow-up Information     None            Patient's Medications    Start Taking    No medications on file   Continue Taking    CYCLOBENZAPRINE (FLEXERIL) 10 MG TABLET    1/2 tab PO every day-BID PRN pain    DILTIAZEM HCL 360 MG TB24    Frequency:   Dosage:0.0     Instructions:  Note:    HYDROCODONE-ACETAMINOPHEN (NORCO) 10-325 MG TABLET    TAKE 1 TABLET 3 to 4 times daily if needed for pain (105 tablets at most over 30 days)    HYDROXYZINE PAMOATE (VISTARIL) 25 MG CAPSULE    Take 25 mg by mouth.    SIMVASTATIN (ZOCOR) 20 MG TABLET    Frequency:   Dosage:0.0     Instructions:  Note:   These Medications have changed    No medications on file   Stop Taking    No medications on file       Scribe Attestation:   I, Carolina Sink, am scribing for and in the presence of Reuel Derby, MD January 18, 2016 at 11:28 AM     Signed by: Carolina Sink, Scribe, 01/18/16, 11:28 AM     Physician Attestation:   I personally performed the services described in this documentation, reviewed and edited the documentation which was dictated to the scribe in my presence, and it accurately records my words and actions.   Reuel Derby, MD

## 2016-01-18 NOTE — ED Notes (Signed)
Cynthia Marsh is a 65 y.o. female that was discharged in stable. Pt was accompanied by spouse. Pt is not driving. The patients diagnosis, condition and treatment were explained to  patient and aftercare instructions were given.  The patient verbalized understanding. Patient armband removed and shredded.

## 2016-01-20 ENCOUNTER — Inpatient Hospital Stay: Admit: 2016-01-20 | Payer: PRIVATE HEALTH INSURANCE | Attending: Gastroenterology | Primary: Family Medicine

## 2016-01-20 DIAGNOSIS — K228 Other specified diseases of esophagus: Secondary | ICD-10-CM

## 2016-01-20 MED ORDER — BARIUM SULFATE 96 % (W/W) ORAL SUSP, RECON
96 % (w/w) | Freq: Once | ORAL | Status: AC
Start: 2016-01-20 — End: 2016-01-20
  Administered 2016-01-20: 14:00:00 via ORAL

## 2016-01-20 MED ORDER — SOD BICARB-CITRIC AC-SIMETH 2.21 GRAM-1.53 GRAM/4 GRAM ORAL GRAN IN PK
Freq: Once | ORAL | Status: AC
Start: 2016-01-20 — End: 2016-01-20
  Administered 2016-01-20: 13:00:00 via ORAL

## 2016-01-20 MED ORDER — BARIUM SULFATE 98 % ORAL SUSP, RECON
98 % | Freq: Once | ORAL | Status: AC
Start: 2016-01-20 — End: 2016-01-20
  Administered 2016-01-20: 13:00:00 via ORAL

## 2016-01-20 MED ORDER — BARIUM SULFATE 700 MG TAB
700 mg | Freq: Once | ORAL | Status: AC
Start: 2016-01-20 — End: 2016-01-20
  Administered 2016-01-20: 13:00:00 via ORAL

## 2016-01-20 MED FILL — E-Z-PAQUE 96 % (W/W) ORAL POWDER FOR SUSPENSION: 96 % (w/w) | ORAL | Qty: 176

## 2016-01-20 MED FILL — E-Z-HD BARIUM 98 % ORAL SUSPENSION: 98 % | ORAL | Qty: 135

## 2016-01-20 MED FILL — E-Z-GAS II 2.21 GRAM-1.53 GRAM/4 GRAM ORAL EFFERVESCENT GRANULES PACK: ORAL | Qty: 1

## 2016-01-20 MED FILL — E-Z DISK 700 MG TABLET: 700 mg | ORAL | Qty: 1

## 2016-01-22 ENCOUNTER — Telehealth

## 2016-01-22 ENCOUNTER — Ambulatory Visit: Admit: 2016-01-22 | Discharge: 2016-01-22 | Payer: MEDICARE | Attending: Physician Assistant | Primary: Family Medicine

## 2016-01-22 DIAGNOSIS — M1711 Unilateral primary osteoarthritis, right knee: Secondary | ICD-10-CM

## 2016-01-22 MED ORDER — SODIUM HYALURONATE 25 MG/2.5 ML (10 MG/ML) INTRA-ARTICULAR SYRINGE
10 mg/mL | Freq: Once | INTRA_ARTICULAR | 0 refills | Status: AC
Start: 2016-01-22 — End: 2016-01-22

## 2016-01-22 NOTE — Telephone Encounter (Signed)
Pt notified.

## 2016-01-22 NOTE — Progress Notes (Signed)
Patient: Cynthia Marsh                MRN: [id number]       SSN: [id number]  Date of Birth: [birth year]        AGE: 65 y.o.        SEX: female  Body mass index is 29.52 kg/(m^2).    PCP: Marvia Pickles, MD  01/22/16        Pt presents today for their 3rd euflexxa  injection for Right knee .  There has been recent fevers/chills, systemic changes, or injuries to report.    PE:  General A&Ox3, NAD  Exam of the knees reveals skin intact, no erythema, no ecchymosis, no signs for infection.  No cyanosis, clubbing, or edema present distally.  Neg calf tenderness, neg homans, no signs for DVT present.    A: Right knee OA    P: * Patient was identified by name and date of birth       * Agreement on procedure being performed was verified      * Risks and Benefits explained to the patient      * Procedure site verified and marked as necessary      * Patient was positioned for comfort      * Consent was signed and verified     2:36 PM      the Right was injected without complications.  Patient was instructed     on injection care.       Sharen Hones, PA-C

## 2016-01-22 NOTE — Telephone Encounter (Signed)
Patient called in and stated that she would like to have a referral placed to pain management. Patient stated that she feel that they can treat her better. Please advise.

## 2016-01-22 NOTE — Telephone Encounter (Signed)
Refer pt to Dr Nelva Bush. Order placed

## 2016-01-29 ENCOUNTER — Encounter: Attending: Physician Assistant | Primary: Family Medicine

## 2016-02-01 NOTE — Telephone Encounter (Signed)
Noted  

## 2016-02-01 NOTE — Telephone Encounter (Signed)
Patient called in and stated that she had a visit with [facility] and they informed her that they are unable to give her medication and that she should see Dr. Shawnie Pons. Patient stated that she will make an appointment to come in and see Dr. Shawnie Pons. Patient scheduled for February 08, 2016. Please be advised.

## 2016-02-02 ENCOUNTER — Inpatient Hospital Stay
Admit: 2016-02-02 | Discharge: 2016-02-05 | Disposition: A | Discharge status: Home / Self Care | Payer: PRIVATE HEALTH INSURANCE | Attending: Psychiatry | Admitting: Psychiatry

## 2016-02-02 DIAGNOSIS — F332 Major depressive disorder, recurrent severe without psychotic features: Principal | ICD-10-CM

## 2016-02-02 LAB — METABOLIC PANEL, COMPREHENSIVE
A-G Ratio: 1.1 (ref 0.8–1.7)
ALT (SGPT): 24 U/L (ref 13–56)
AST (SGOT): 14 U/L — ABNORMAL LOW (ref 15–37)
Albumin: 4.4 g/dL (ref 3.4–5.0)
Alk. phosphatase: 69 U/L (ref 45–117)
Anion gap: 11 mmol/L (ref 3.0–18)
BUN/Creatinine ratio: 26 — ABNORMAL HIGH (ref 12–20)
BUN: 22 MG/DL — ABNORMAL HIGH (ref 7.0–18)
Bilirubin, total: 0.4 MG/DL (ref 0.2–1.0)
CO2: 26 mmol/L (ref 21–32)
Calcium: 9.6 MG/DL (ref 8.5–10.1)
Chloride: 104 mmol/L (ref 100–108)
Creatinine: 0.84 MG/DL (ref 0.6–1.3)
GFR est AA: >60 mL/min/{1.73_m2} (ref >60)
GFR est non-AA: >60 mL/min/{1.73_m2} (ref >60)
Globulin: 3.9 g/dL (ref 2.0–4.0)
Glucose: 116 mg/dL — ABNORMAL HIGH (ref 74–99)
Potassium: 3.3 mmol/L — ABNORMAL LOW (ref 3.5–5.5)
Protein, total: 8.3 g/dL — ABNORMAL HIGH (ref 6.4–8.2)
Sodium: 141 mmol/L (ref 136–145)

## 2016-02-02 LAB — CBC WITH AUTOMATED DIFF
ABS. BASOPHILS: 0 10*3/uL (ref 0.0–0.06)
ABS. EOSINOPHILS: 0.1 10*3/uL (ref 0.0–0.4)
ABS. LYMPHOCYTES: 1.6 10*3/uL (ref 0.9–3.6)
ABS. MONOCYTES: 0.4 10*3/uL (ref 0.05–1.2)
ABS. NEUTROPHILS: 2.3 10*3/uL (ref 1.8–8.0)
BASOPHILS: 1 % (ref 0–2)
EOSINOPHILS: 1 % (ref 0–5)
HCT: 40.3 % (ref 35.0–45.0)
HGB: 12.4 g/dL (ref 12.0–16.0)
LYMPHOCYTES: 37 % (ref 21–52)
MCH: 25.7 PG (ref 24.0–34.0)
MCHC: 30.8 g/dL — ABNORMAL LOW (ref 31.0–37.0)
MCV: 83.4 FL (ref 74.0–97.0)
MONOCYTES: 8 % (ref 3–10)
MPV: 9.4 FL (ref 9.2–11.8)
NEUTROPHILS: 53 % (ref 40–73)
PLATELET: 359 10*3/uL (ref 135–420)
RBC: 4.83 M/uL (ref 4.20–5.30)
RDW: 14.9 % — ABNORMAL HIGH (ref 11.6–14.5)
WBC: 4.4 10*3/uL — ABNORMAL LOW (ref 4.6–13.2)

## 2016-02-02 LAB — DRUG SCREEN, URINE (HBV ONLY)
AMPHETAMINES: NEGATIVE
BARBITURATES: NEGATIVE
BENZODIAZEPINES: NEGATIVE
COCAINE: NEGATIVE
METHADONE: NEGATIVE
Methamphetamines: NEGATIVE
OPIATES: NEGATIVE
PCP(PHENCYCLIDINE): NEGATIVE
THC (TH-CANNABINOL): NEGATIVE
TRICYCLICS: POSITIVE — AB

## 2016-02-02 LAB — GLUCOSE, POC: Glucose (POC): 99 mg/dL (ref 70–110)

## 2016-02-02 LAB — ETHYL ALCOHOL: ALCOHOL(ETHYL),SERUM: 4 MG/DL — ABNORMAL HIGH (ref 0–3)

## 2016-02-02 MED ORDER — RIVAROXABAN 10 MG TAB
10 mg | Freq: Every day | ORAL | Status: DC
Start: 2016-02-02 — End: 2016-02-02

## 2016-02-02 MED ORDER — METFORMIN 500 MG TAB
500 mg | Freq: Two times a day (BID) | ORAL | Status: DC
Start: 2016-02-02 — End: 2016-02-02
  Administered 2016-02-02: 23:00:00 via ORAL

## 2016-02-02 MED FILL — METFORMIN 500 MG TAB: 500 mg | ORAL | Qty: 2

## 2016-02-02 MED FILL — XARELTO 10 MG TABLET: 10 mg | ORAL | Qty: 1

## 2016-02-02 NOTE — ED Triage Notes (Signed)
Patient takes Prozac 40 mg , Xanax .5 every day, Buspar 30 mg TID, Remeron 20 mg every day, and Ambien 10 mg at hs.  She states these don't work for her anymore. Her next appointment with her PMD is Monday and with psych is March 10, 2016. Just wants to hang on til Monday so she can see her doctor. Is not currently suicidal. Is depressed.

## 2016-02-02 NOTE — ED Notes (Signed)
Received patient from [facility] ED for crisis evaluation.  The patient is awake, alert, oriented X 3.  Denies SI or HI at this time.  VSS, refer to flow sheet.  Crisis RN paged to inform that the patient has arrived to our ED.

## 2016-02-02 NOTE — ED Notes (Signed)
Patient received by Medical Transport Team for transport to inpatient bed.  Patient in stable condition at time of transport.  Personal belongings accompanying patient to inpatient bed.  Medical transport team received report on patient's condition and concerns at time of departure.   Transport team denies further questions or concerns related to patient at time of departure.

## 2016-02-02 NOTE — ED Notes (Signed)
1530: I assumed care of this patient from Dr. Jillyn Ledger.  Patient presented with anxiety/depression, sent from [facility] to be evaluated by crisis.      She was evaluated at 1550 hrs.: She is sitting on stretcher, tearful, cooperative.  She is willing to speak with a crisis counselor, denies any current suicidal or homicidal ideation    Patient was accepted to inpatient psychiatry        Disposition:    Transfer to crisis      Portions of this chart were created with Dragon medical speech to text program.   Unrecognized errors may be present.

## 2016-02-02 NOTE — ED Provider Notes (Signed)
HPI Comments: 1:03 PM Cynthia Marsh is a 65 y.o. female with a history of DM, Asthma, HTN, Hypercholesterolemia, CKD and Aortic Aneurysm who presents to the emergency department for evaluation of increased anxiety. Pt states that she has had increased stress as of late and feels that none of her current anxiety and depression medications are working. She states that she is out of her Xanax and will be prescribed more once evaluated by her PCP on 3/7. She also notes a follow-up appointment scheduled with her psychiatrist. Pt denies SI or HI at this time but does note some SI recently. Patient denies the use of tobacco, EtOH, or illicit drugs. No other complaints or concerns at this time.     PCP: Thana Farr, MD              The history is provided by the patient.        Past Medical History:   Diagnosis Date   ??? Anxiety    ??? Asthma    ??? 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 Cardiac echocardiogram 11/07/2015    EF 60%.  No RWMA.  Gr 1 DDfx.  RVSP 30 mmHg.     ??? Cardiovascular lower extremity venous duplex 11/06/2015    No DVT bilaterally.   ??? Chronic kidney disease    ??? Depression    ??? Diabetes ([place])    ??? Hypertension    ??? Insomnia    ??? Osteoporosis    ??? Pneumonia 2010   ??? Thromboembolus [facility])        Past Surgical History:   Procedure Laterality Date   ??? BREAST SURGERY PROCEDURE UNLISTED       Left cyst removed    ??? HAND/FINGER SURGERY UNLISTED     ??? HX BACK SURGERY  2012    L4/L5/S1 fused in [place]   ??? HX CYST INCISION AND DRAINAGE Left    ??? HX CYST REMOVAL      calf    ??? HX GYN      complete hyst    ??? HX GYN      tubal ligation   ??? HX HYSTERECTOMY     ??? HX OOPHORECTOMY     ??? HX ORTHOPAEDIC      Right shoulder rotator cuff    ??? HX ORTHOPAEDIC      Left Little toe hammer toe, left big toe bunion removed   ??? HX ORTHOPAEDIC      Right knee torn meniscus   ??? HX ORTHOPAEDIC      CTS bilateral , Left thumb trigger finger    ??? HX WRIST FRACTURE TX     ??? LAMINECTOMY,CERVICAL      ??? PR ANESTH,SURGERY OF SHOULDER           Family History:   Problem Relation Age of Onset   ??? Diabetes Mother    ??? Diabetes Brother    ??? Cancer Sister    ??? Cancer Maternal Grandmother    ??? Cancer Daughter        Social History     Social History   ??? Marital status: SINGLE     Spouse name: N/A   ??? Number of children: N/A   ??? Years of education: N/A     Occupational History   ??? Not on file.     Social History Main Topics   ??? Smoking status: Never Smoker   ??? Smokeless tobacco: Never Used   ???  Alcohol use Yes      Comment: on occasion   ??? Drug use: No   ??? Sexual activity: Not on file     Other Topics Concern   ??? Not on file     Social History Narrative         ALLERGIES: Nuts [tree nut]; Almond; Carvedilol; Cozaar [losartan]; and Crestor [rosuvastatin]    Review of Systems   Constitutional: Negative.  Negative for chills, diaphoresis and fever.   HENT: Negative.  Negative for congestion, rhinorrhea and sore throat.    Eyes: Negative.  Negative for pain, discharge and redness.   Respiratory: Negative.  Negative for cough, chest tightness, shortness of breath and wheezing.    Cardiovascular: Negative.  Negative for chest pain.   Gastrointestinal: Negative.  Negative for abdominal pain, constipation, diarrhea, nausea and vomiting.   Genitourinary: Negative.  Negative for dysuria, flank pain, frequency, hematuria and urgency.   Musculoskeletal: Negative.  Negative for back pain and neck pain.   Skin: Negative.  Negative for rash.   Neurological: Negative.  Negative for syncope, weakness, numbness and headaches.   Psychiatric/Behavioral: Negative.    All other systems reviewed and are negative.      Vitals:    02/02/16 1231 02/02/16 1430   BP: 116/72 119/86   Pulse: 68 88   Resp: 18 16   Temp: 98.2 ??F (36.8 ??C)    SpO2: 100% 100%   Weight: 78 kg (172 lb)    Height: _0  (1.626 m)             Physical Exam   Constitutional: She appears well-developed and well-nourished.  Non-toxic  appearance. She does not have a sickly appearance. She does not appear ill. No distress.   HENT:   Head: Normocephalic and atraumatic.   Mouth/Throat: Oropharynx is clear and moist. No oropharyngeal exudate.   Eyes: Conjunctivae and EOM are normal. Pupils are equal, round, and reactive to light. No scleral icterus.   Neck: Normal range of motion. Neck supple. No hepatojugular reflux and no JVD present. No tracheal deviation present. No thyromegaly present.   Cardiovascular: Normal rate, regular rhythm, S1 normal, S2 normal, normal heart sounds, intact distal pulses and normal pulses.  Exam reveals no gallop, no S3 and no S4.    No murmur heard.  Pulses:       Radial pulses are 2+ on the right side, and 2+ on the left side.        Dorsalis pedis pulses are 2+ on the right side, and 2+ on the left side.   Pulmonary/Chest: Effort normal and breath sounds normal. No respiratory distress. She has no decreased breath sounds. She has no wheezes. She has no rhonchi. She has no rales.   Abdominal: Soft. Normal appearance and bowel sounds are normal. She exhibits no distension and no mass. There is no hepatosplenomegaly. There is no tenderness. There is no rigidity, no rebound, no guarding, no CVA tenderness, no tenderness at McBurney's point and negative Murphy's sign.   Musculoskeletal: Normal range of motion.   Strength 5/5 throughout    Lymphadenopathy:        Head (right side): No submental, no submandibular, no preauricular and no occipital adenopathy present.        Head (left side): No submental, no submandibular, no preauricular and no occipital adenopathy present.     She has no cervical adenopathy.        Right: No supraclavicular adenopathy present.  Left: No supraclavicular adenopathy present.   Neurological: She is alert. She has normal strength and normal reflexes. She is not disoriented. No cranial nerve deficit or sensory deficit. Coordination and gait normal. GCS eye subscore is 4. GCS verbal subscore  is 5. GCS motor subscore is 6.   Grossly intact    Skin: Skin is warm, dry and intact. No rash noted. She is not diaphoretic.   Psychiatric: She has a normal mood and affect. Her speech is normal and behavior is normal. Judgment and thought content normal. Cognition and memory are normal.   Nursing note and vitals reviewed.       MDM  Number of Diagnoses or Management Options  Suicidal behavior:   Diagnosis management comments: Suicidal ideation  Axiety   Depression     ED Course       Procedures    Vitals:  Patient Vitals for the past 12 hrs:   Temp Pulse Resp BP SpO2   02/02/16 1430 - 88 16 119/86 100 %   02/02/16 1231 98.2 ??F (36.8 ??C) 68 18 116/72 100 %   100 %. Percentage is within normal limits.       Medications ordered:   Medications - No data to display      Lab findings:  Recent Results (from the past 12 hour(s))   CBC WITH AUTOMATED DIFF    Collection Time: 02/02/16  1:15 PM   Result Value Ref Range    WBC 4.4 (L) 4.6 - 13.2 K/uL    RBC 4.83 4.20 - 5.30 M/uL    HGB 12.4 12.0 - 16.0 g/dL    HCT 40.3 35.0 - 45.0 %    MCV 83.4 74.0 - 97.0 FL    MCH 25.7 24.0 - 34.0 PG    MCHC 30.8 (L) 31.0 - 37.0 g/dL    RDW 14.9 (H) 11.6 - 14.5 %    PLATELET 359 135 - 420 K/uL    MPV 9.4 9.2 - 11.8 FL    NEUTROPHILS 53 40 - 73 %    LYMPHOCYTES 37 21 - 52 %    MONOCYTES 8 3 - 10 %    EOSINOPHILS 1 0 - 5 %    BASOPHILS 1 0 - 2 %    ABS. NEUTROPHILS 2.3 1.8 - 8.0 K/UL    ABS. LYMPHOCYTES 1.6 0.9 - 3.6 K/UL    ABS. MONOCYTES 0.4 0.05 - 1.2 K/UL    ABS. EOSINOPHILS 0.1 0.0 - 0.4 K/UL    ABS. BASOPHILS 0.0 0.0 - 0.06 K/UL    DF AUTOMATED     METABOLIC PANEL, COMPREHENSIVE    Collection Time: 02/02/16  1:15 PM   Result Value Ref Range    Sodium 141 136 - 145 mmol/L    Potassium 3.3 (L) 3.5 - 5.5 mmol/L    Chloride 104 100 - 108 mmol/L    CO2 26 21 - 32 mmol/L    Anion gap 11 3.0 - 18 mmol/L    Glucose 116 (H) 74 - 99 mg/dL    BUN 22 (H) 7.0 - 18 MG/DL    Creatinine 0.84 0.6 - 1.3 MG/DL    BUN/Creatinine ratio 26 (H) 12 - 20       GFR est AA >60 >60 ml/min/1.40m    GFR est non-AA >60 >60 ml/min/1.727m   Calcium 9.6 8.5 - 10.1 MG/DL    Bilirubin, total 0.4 0.2 - 1.0 MG/DL    ALT (SGPT) 24 13 - 56  U/L    AST (SGOT) 14 (L) 15 - 37 U/L    Alk. phosphatase 69 45 - 117 U/L    Protein, total 8.3 (H) 6.4 - 8.2 g/dL    Albumin 4.4 3.4 - 5.0 g/dL    Globulin 3.9 2.0 - 4.0 g/dL    A-G Ratio 1.1 0.8 - 1.7     ETHYL ALCOHOL    Collection Time: 02/02/16  1:15 PM   Result Value Ref Range    ALCOHOL(ETHYL),SERUM 4 (H) 0 - 3 MG/DL   DRUG SCREEN, URINE ([facility] ONLY)    Collection Time: 02/02/16  1:15 PM   Result Value Ref Range    PCP(PHENCYCLIDINE) NEGATIVE  NEG      THC (TH-CANNABINOL) NEGATIVE  NEG      AMPHETAMINE NEGATIVE  NEG      Methamphetamines NEGATIVE       BARBITURATES NEGATIVE  NEG      BENZODIAZEPINE NEGATIVE  NEG      METHADONE NEGATIVE  NEG      COCAINE NEGATIVE  NEG      OPIATES NEGATIVE  NEG      TRICYCLICS POSITIVE (A) NEG      HDSCOM        Specimen analysis was performed without chain of custody handling.  These results should be used for medical purposes only and not for legal or employment purposes.  Unconfirmed screening results must not be used for non-medical purposes.     Progress notes, Consult notes or additional notes:  1:31 PM Consult:  Discussed Marsh with Tim from Crisis. Standard discussion; including history of patient???s chief complaint, available diagnostic results, and treatment course. He will evaluate the patient and recommends calling [facility] to initiate transfer.     1:45 PM Consult:  Discussed Marsh with Dr. Elmer Bales, ED physician. Standard discussion; including history of patient???s chief complaint, available diagnostic results, and treatment course. He accepts the patient as an ED to ED transfer.     Disposition:Transfered to [facility]    Diagnosis: No diagnosis found.      Scribe Attestation:     I, Brook for and in the presence of Darrold Span, DO February 02, 2016 at 2:36 PM        Physician Attestation:   I personally performed the services described in this documentation, reviewed and edited the documentation which was dictated to the scribe in my presence, and it accurately records my words and actions. [place], DO  February 02, 2016 at 2:36 PM    Signed by: Beatriz Chancellor, Scribe February 02, 2016, 2:36 PM

## 2016-02-02 NOTE — ED Notes (Signed)
TRANSFER - OUT REPORT:    Verbal report given to Asencion Noble, RN(name) on Cynthia Marsh  being transferred to [facility], Emergency Department(unit) for routine progression of care       Report consisted of patient???s Situation, Background, Assessment and   Recommendations(SBAR).     Information from the following report(s) SBAR, ED Summary, Procedure Summary and MAR was reviewed with the receiving nurse.    Lines:       Opportunity for questions and clarification was provided.

## 2016-02-02 NOTE — Other (Signed)
Comprehensive Assessment Form Part 1    Section I - Disposition        The plan is to admit to the Adult Unit room 122-1  The admitting Psychiatrist will be Dr. Gale Journey  The admitting Diagnosis is Depression    Section II - Integrated Summary    This is a 65 year old female with a history of diabetes, asthma, HTN, hypercholesterolemia, aortic aneurysm, a cyst on her spine,  2 back surgeries, depression and anxiety who presented in [facility] ED saying her medications are not working. She said she is on Prozac, Buspar, Remeron, Ambien and Xanax. She has run out of the Xanax and Ambien but claims she is taking the other medications. She was sent to [facility] ED for a crisis evaluation because she told them she had suicidal and homicidal ideations. Patient is very depressed and tearful. She said in December she was told that she had a blood clot in her left lung and a cyst on her spine. States she was told that she couldn't have surgery at that time because she might end up paralyzed.She said she has screws and rods in her back and is on Oxycodone. She claims she is getting ready to start in a pain management program. Stated "all I keep thinking is I'm gonna die and not see my kids and not see my grand kids grow up." She is followed by Toney Rakes at [facility]. She called them and was told that they would not be able to see her until April the 6th. She called her PCP and was told they wouldn't be able to see her until Monday. She said "I don't know if I can wait that long." She said "I feel like I want to explode." She said she doesn't want to get out of bed, she doesn't want to eat and she doesn't want to bathe. She said she tries to act like she is fine around her family because she doesn't want to worry them. She said she has had 2 psych admissions a long time ago when she was living in [place].                       Peggyann Juba, RN

## 2016-02-02 NOTE — Behavioral Health Treatment Team (Signed)
Patient arrived via w/c escorted from [facility] ED escorted by security and ED nursing staff; patient is alert and oriented x 4; stated that she been feeling overwhelmed with anxiety and depression and medications are not working; also, stated that she was having suicidal and homicidal ideations without a plan; depressed mood; teary-eyed during assessment; denied thoughts of harm to self or others; denied hallucinations; patient oriented to unit and room; will monitor per protocol.    Ms. Cindee Lame [facility]) is her therapist.

## 2016-02-02 NOTE — Other (Signed)
TRANSFER - OUT REPORT:    Verbal report given to [facility] RN(name) on Cynthia Marsh  being transferred to Behavioral (unit) for routine progression of care       Report consisted of patient???s Situation, Background, Assessment and   Recommendations(SBAR).     Information from the following report(s) SBAR and ED Summary was reviewed with the receiving nurse.    Lines:       Opportunity for questions and clarification was provided.      Patient transported with:   [employer]

## 2016-02-03 ENCOUNTER — Encounter

## 2016-02-03 LAB — GLUCOSE, POC
Glucose (POC): 175 mg/dL — ABNORMAL HIGH (ref 70–110)
Glucose (POC): 140 mg/dL — ABNORMAL HIGH (ref 70–110)

## 2016-02-03 LAB — METABOLIC PANEL, COMPREHENSIVE
A-G Ratio: 1.2 (ref 0.8–1.7)
ALT (SGPT): 23 U/L (ref 13–56)
AST (SGOT): 13 U/L — ABNORMAL LOW (ref 15–37)
Albumin: 4.1 g/dL (ref 3.4–5.0)
Alk. phosphatase: 66 U/L (ref 45–117)
Anion gap: 8 mmol/L (ref 3.0–18)
BUN/Creatinine ratio: 27 — ABNORMAL HIGH (ref 12–20)
BUN: 19 MG/DL — ABNORMAL HIGH (ref 7.0–18)
Bilirubin, total: 0.4 MG/DL (ref 0.2–1.0)
CO2: 27 mmol/L (ref 21–32)
Calcium: 9.8 MG/DL (ref 8.5–10.1)
Chloride: 104 mmol/L (ref 100–108)
Creatinine: 0.71 MG/DL (ref 0.6–1.3)
GFR est AA: >60 mL/min/{1.73_m2} (ref >60)
GFR est non-AA: >60 mL/min/{1.73_m2} (ref >60)
Globulin: 3.4 g/dL (ref 2.0–4.0)
Glucose: 131 mg/dL — ABNORMAL HIGH (ref 74–99)
Potassium: 3.6 mmol/L (ref 3.5–5.5)
Protein, total: 7.5 g/dL (ref 6.4–8.2)
Sodium: 139 mmol/L (ref 136–145)

## 2016-02-03 LAB — CBC WITH AUTOMATED DIFF
ABS. BASOPHILS: 0 10*3/uL (ref 0.0–0.1)
ABS. EOSINOPHILS: 0.1 10*3/uL (ref 0.0–0.4)
ABS. LYMPHOCYTES: 2 10*3/uL (ref 0.9–3.6)
ABS. MONOCYTES: 0.4 10*3/uL (ref 0.05–1.2)
ABS. NEUTROPHILS: 2.3 10*3/uL (ref 1.8–8.0)
BASOPHILS: 0 % (ref 0–2)
EOSINOPHILS: 1 % (ref 0–5)
HCT: 37.5 % (ref 35.0–45.0)
HGB: 11.9 g/dL — ABNORMAL LOW (ref 12.0–16.0)
LYMPHOCYTES: 43 % (ref 21–52)
MCH: 26.4 PG (ref 24.0–34.0)
MCHC: 31.7 g/dL (ref 31.0–37.0)
MCV: 83.1 FL (ref 74.0–97.0)
MONOCYTES: 8 % (ref 3–10)
MPV: 9.7 FL (ref 9.2–11.8)
NEUTROPHILS: 48 % (ref 40–73)
PLATELET: 355 10*3/uL (ref 135–420)
RBC: 4.51 M/uL (ref 4.20–5.30)
RDW: 14.7 % — ABNORMAL HIGH (ref 11.6–14.5)
WBC: 4.7 10*3/uL (ref 4.6–13.2)

## 2016-02-03 LAB — TSH 3RD GENERATION: TSH: 0.72 u[IU]/mL (ref 0.36–3.74)

## 2016-02-03 MED ORDER — RIVAROXABAN 10 MG TAB
10 mg | Freq: Every day | ORAL | Status: DC
Start: 2016-02-03 — End: 2016-02-02

## 2016-02-03 MED ORDER — BUSPIRONE 5 MG TAB
5 mg | Freq: Three times a day (TID) | ORAL | Status: DC
Start: 2016-02-03 — End: 2016-02-05
  Administered 2016-02-03 – 2016-02-05 (×6): via ORAL

## 2016-02-03 MED ORDER — METFORMIN 500 MG TAB
500 mg | Freq: Two times a day (BID) | ORAL | Status: DC
Start: 2016-02-03 — End: 2016-02-05
  Administered 2016-02-03 – 2016-02-05 (×5): via ORAL

## 2016-02-03 MED ORDER — DULOXETINE 20 MG CAP, DELAYED RELEASE
20 mg | Freq: Two times a day (BID) | ORAL | Status: DC
Start: 2016-02-03 — End: 2016-02-04
  Administered 2016-02-03 – 2016-02-04 (×3): via ORAL

## 2016-02-03 MED ORDER — HALOPERIDOL LACTATE 5 MG/ML IJ SOLN
5 mg/mL | INTRAMUSCULAR | Status: DC | PRN
Start: 2016-02-03 — End: 2016-02-04

## 2016-02-03 MED ORDER — DILTIAZEM ER 180 MG 24 HR CAP
180 mg | Freq: Every day | ORAL | Status: DC
Start: 2016-02-03 — End: 2016-02-05
  Administered 2016-02-03 – 2016-02-05 (×3): via ORAL

## 2016-02-03 MED ORDER — RIVAROXABAN 20 MG TAB
20 mg | Freq: Every day | ORAL | Status: DC
Start: 2016-02-03 — End: 2016-02-05
  Administered 2016-02-03 – 2016-02-04 (×2): via ORAL

## 2016-02-03 MED ORDER — TRAZODONE 50 MG TAB
50 mg | Freq: Every evening | ORAL | Status: DC | PRN
Start: 2016-02-03 — End: 2016-02-04
  Administered 2016-02-04: 03:00:00 via ORAL

## 2016-02-03 MED ORDER — SIMVASTATIN 20 MG TAB
20 mg | Freq: Every day | ORAL | Status: DC
Start: 2016-02-03 — End: 2016-02-03

## 2016-02-03 MED ORDER — HYDROCODONE-ACETAMINOPHEN 10 MG-325 MG TAB
10-325 mg | Freq: Three times a day (TID) | ORAL | Status: DC | PRN
Start: 2016-02-03 — End: 2016-02-03

## 2016-02-03 MED ORDER — HALOPERIDOL 5 MG TAB
5 mg | ORAL | Status: DC | PRN
Start: 2016-02-03 — End: 2016-02-04

## 2016-02-03 MED ORDER — HYDROXYZINE PAMOATE 25 MG CAP
25 mg | ORAL | Status: DC | PRN
Start: 2016-02-03 — End: 2016-02-05

## 2016-02-03 MED ORDER — RIVAROXABAN 10 MG TAB
10 mg | Freq: Every day | ORAL | Status: DC
Start: 2016-02-03 — End: 2016-02-03
  Administered 2016-02-03: 03:00:00 via ORAL

## 2016-02-03 MED ORDER — TRAZODONE 50 MG TAB
50 mg | Freq: Every evening | ORAL | Status: DC
Start: 2016-02-03 — End: 2016-02-04
  Administered 2016-02-04: 02:00:00 via ORAL

## 2016-02-03 MED ORDER — HYDROCODONE-ACETAMINOPHEN 10 MG-325 MG TAB
10-325 mg | Freq: Three times a day (TID) | ORAL | Status: DC | PRN
Start: 2016-02-03 — End: 2016-02-04

## 2016-02-03 MED FILL — XARELTO 20 MG TABLET: 20 mg | ORAL | Qty: 1

## 2016-02-03 MED FILL — DULOXETINE 20 MG CAP, DELAYED RELEASE: 20 mg | ORAL | Qty: 1

## 2016-02-03 MED FILL — BUSPIRONE 5 MG TAB: 5 mg | ORAL | Qty: 3

## 2016-02-03 MED FILL — METFORMIN 500 MG TAB: 500 mg | ORAL | Qty: 2

## 2016-02-03 NOTE — H&P (Addendum)
History and Physical        Patient: Cynthia Marsh               Sex: female          DOA: 02/02/2016         Date of Birth:  May 11, 1951      Age:  65 y.o.        LOS:  LOS: 1 day        HPI:     Cynthia Marsh is a 65 y.o. female who was admitted from [facility] ER experiencing homicidal and suicidal ideation.    Active Problems:    Depression ()        Past Medical History:   Diagnosis Date   ??? Anxiety    ??? Asthma    ??? 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 Cardiac echocardiogram 11/07/2015    EF 60%.  No RWMA.  Gr 1 DDfx.  RVSP 30 mmHg.     ??? Cardiovascular lower extremity venous duplex 11/06/2015    No DVT bilaterally.   ??? Chronic kidney disease    ??? Depression    ??? Diabetes (HCC)    ??? Hypertension    ??? Insomnia    ??? Osteoporosis    ??? Pneumonia 2010   ??? Thromboembolus [facility])        Past Surgical History:   Procedure Laterality Date   ??? BREAST SURGERY PROCEDURE UNLISTED       Left cyst removed    ??? HAND/FINGER SURGERY UNLISTED     ??? HX BACK SURGERY  2012    L4/L5/S1 fused in [place]   ??? HX CYST INCISION AND DRAINAGE Left    ??? HX CYST REMOVAL      calf    ??? HX GYN      complete hyst    ??? HX GYN      tubal ligation   ??? HX HYSTERECTOMY     ??? HX OOPHORECTOMY     ??? HX ORTHOPAEDIC      Right shoulder rotator cuff    ??? HX ORTHOPAEDIC      Left Little toe hammer toe, left big toe bunion removed   ??? HX ORTHOPAEDIC      Right knee torn meniscus   ??? HX ORTHOPAEDIC      CTS bilateral , Left thumb trigger finger    ??? HX WRIST FRACTURE TX     ??? LAMINECTOMY,CERVICAL     ??? PR ANESTH,SURGERY OF SHOULDER         Family History   Problem Relation Age of Onset   ??? Diabetes Mother    ??? Diabetes Brother    ??? Cancer Sister    ??? Cancer Maternal Grandmother    ??? Cancer Daughter        Social History     Social History   ??? Marital status: SINGLE     Spouse name: N/A   ??? Number of children: 2   ??? Years of education: 3 years college     Social History Main Topics   ??? Smoking status: Never Smoker    ??? Smokeless tobacco: Never Used   ??? Alcohol use Yes      Comment: on occasion   ??? Drug use: No   ??? Sexual activity: Not Asked     Other Topics Concern   ??? None     Social History Narrative  Patient states she lives with her significant other. Reports appetite is poor and sleep is poor. States she receives disability.    Prior to Admission medications    Medication Sig Start Date End Date Taking? Authorizing Provider   diltiazem hcl 360 mg Tb24 Frequency:   Dosage:0.0     Instructions:  Note: 06/11/12  Yes Historical Provider   HYDROcodone-acetaminophen (NORCO) 10-325 mg tablet TAKE 1 TABLET 3 to 4 times daily if needed for pain (105 tablets at most over 30 days) 06/28/14  Yes Historical Provider   simvastatin (ZOCOR) 20 mg tablet Frequency:   Dosage:0.0     Instructions:  Note: 06/06/13  Yes Historical Provider   hydrOXYzine pamoate (VISTARIL) 25 mg capsule Take 25 mg by mouth. 08/03/12  Yes Historical Provider       Allergies   Allergen Reactions   ??? 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   ??? Almond Other (comments)     Tongue itches   ??? Carvedilol Other (comments)   ??? Cozaar [Losartan] Cough   ??? Crestor [Rosuvastatin] Unknown (comments)     Heart beats fast       Review of Systems  A comprehensive review of systems was negative.      Physical Exam:      Visit Vitals   ??? BP 115/87 (BP 1 Location: Right arm, BP Patient Position: Sitting)   ??? Pulse 89   ??? Temp 96.5 ??F (35.8 ??C)   ??? Resp 15   ??? Ht 5\' 4"  (1.626 m)   ??? Wt 172 lb (78 kg)   ??? SpO2 96%   ??? BMI 29.52 kg/m2       Physical Exam:    General:  Alert, cooperative, well developed, well nourished AA female, no distress, appears stated age. Uses walker to assist with ambulation   Eyes:  Conjunctivae/corneas clear. PERRL, EOMs intact. Fundi benign   Ears:  Normal TMs and external ear canals both ears.   Nose: Nares normal. Septum midline. Mucosa normal. No drainage or sinus tenderness.    Mouth/Throat: Lips, mucosa, and tongue normal. Teeth and gums normal.   Neck: Supple, symmetrical, trachea midline, no adenopathy, thyroid: no enlargement/tenderness/nodules, no carotid bruit and no JVD.   Back:   Symmetric, no curvature. ROM normal. No CVA tenderness.   Lungs:   Clear to auscultation bilaterally.   Heart:  Regular rate and rhythm, S1, S2 normal, no murmur, click, rub or gallop.   Abdomen:   Soft, non-tender. Bowel sounds normal. No masses,  No organomegaly.   Extremities: Extremities normal, atraumatic, no cyanosis or edema. Brace to right knee.   Pulses: 2+ and symmetric all extremities.   Skin: Skin color, texture, turgor normal. No rashes or lesions   Lymph nodes: Cervical, supraclavicular, and axillary nodes normal.   Neurologic: CNII-XII intact. Normal strength, sensation and reflexes throughout.           Assessment/Plan     Depression  Suicidal ideation  Homicidal ideation  Right knee pain  Labs reviewed  Continue treatment per physician's orders

## 2016-02-03 NOTE — Progress Notes (Signed)
Problem: Suicide/Homicide (Adult/Pediatric)  Goal: *STG: Remains safe in hospital  Patient will remain safe during hospital stay.   Outcome: Progressing Towards Goal  Patient denies thoughts of self harm.  Goal: *STG: Seeks staff when feelings of self harm or harm towards others arise  Patient will seek staff, if thoughts of harm to self or others occur.   Outcome: Progressing Towards Goal  Patient establishes a therapeutic relationship with staff and seek staff member when feelings of self harm arise.  Goal: *STG: Attends activities and groups  AEB attending at least three groups/activities per day while hospitalized.   Outcome: Progressing Towards Goal  Patient attends and actively participates in group.  Goal: *STG/LTG: Complies with medication therapy  AEB by taking medication as prescribed daily while hospitalized.   Outcome: Progressing Towards Goal  Patient is medication compliant.    Comments:   Patient has been up in milieu most of evening.  She is alert and oriented.  She is cooperative and compliant.  She denies thoughts of harm to self or others.  Will continue to monitor and provide a safe and therapeutic environment.

## 2016-02-03 NOTE — Progress Notes (Signed)
Spiritual class    She was reluctant to join class    Participated in class    Answered questions well    Does not see her self as honorable        Achilles Dunk  Spiritual Care  [phone]

## 2016-02-03 NOTE — H&P (Addendum)
BEHAVIORAL HEALTH HISTORY AND PHYSICAL    Patient: Cynthia Marsh               Sex: female          DOA: 02/02/2016       Date of Birth:  [birth year]      Age:  65 y.o.        LOS:  LOS: 1 day     HISTORY OF PRESENT ILLNESS:     Cynthia Marsh is a 65 y.o.  African American female who presents with a 2 week history of increased anxiety, worsening depression, poor sleep, with suicidal ideation, and overwhelming feeling of despair. She follows with [facility], and is prescribed Buspar, Remeron, Xanax, Prozac, and Ambien.    She reports that her recent diagnosis of pulmonary embolism, and then a cyst on her spine which was diagnosed and seen incidentally on an MRI of her back back in 08/2015; she had asked for it to be removed, but this was precluded with the PE dx in 11/2015. She feels her body is falling apart, and essentially she is dying. She si worried she will be on xarelto for the rest of her life. She gets aggravated from pain in her back at times, and is on high dose of Oxycodone. Her next psych appt is not till April for medication management, and she decided to come in for medication adjustment.    She endorses depressive symptoms such as SIG E CAPS; has poor sleep, Ambien and Remeron does not work anymore. She also struggles with generalised anxiety such as excessive worry about everyday things, overthinking things; Feeling restless and have trouble relaxing. She has a hard time concentrating, is easily startled and she has trouble falling asleep or staying asleep.  She feels easily tired or tired all the time and c/o of headaches, muscle aches, stomach aches, or unexplained pains. There is difficulty swallowing, and though GI is examining this, this is likely part of her anxiety disorder.      There are no endorsed symptoms of Bipolar disorder.  There are no perceptual abnormalities.  She has no hx to satisfy PTSD criteria.    Past Psych Hx; this is admission #3; she has been admitted b4 in  [place] in [place], and a [facility] in [place] before. No hx of self harm, but has had several thoughts of doing so. She has no other medication trials.    Med Hx; see below.    Allergies; Cozzar-cough; Crestor-makes her achy.    Legal Hx; Hx of GL X 1; back 30 years ago. No probation, no pending charges.    S/A Hx; Hx of THC use, last in 2009. No DIP, no DUI, and no hx of HIV, or hepatitis.    Social Hx; Born in [place], and raised in [place]. Mother died at age 28, and Father at age 33. She is the oldest in a family of 7. Did 3 years of college, studied Tax inspector. 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 Has 2 children.    Active Problems:    Depression ()         Past Medical History:   Diagnosis Date   ??? Anxiety    ??? Asthma    ??? 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 Cardiac echocardiogram 11/07/2015    EF 60%.  No RWMA.  Gr  1 DDfx.  RVSP 30 mmHg.     ??? Cardiovascular lower extremity venous duplex 11/06/2015    No DVT bilaterally.   ??? Chronic kidney disease    ??? Depression    ??? Diabetes (Cynthia Marsh)    ??? Hypertension    ??? Insomnia    ??? Osteoporosis    ??? Pneumonia 2010   ??? Thromboembolus Cynthia Marsh)         Past Surgical History:   Procedure Laterality Date   ??? BREAST SURGERY PROCEDURE UNLISTED       Left cyst removed    ??? HAND/FINGER SURGERY UNLISTED     ??? HX BACK SURGERY  2012    L4/L5/S1 fused in [place]   ??? HX CYST INCISION AND DRAINAGE Left    ??? HX CYST REMOVAL      calf    ??? HX GYN      complete hyst    ??? HX GYN      tubal ligation   ??? HX HYSTERECTOMY     ??? HX OOPHORECTOMY     ??? HX ORTHOPAEDIC      Right shoulder rotator cuff    ??? HX ORTHOPAEDIC      Left Little toe hammer toe, left big toe bunion removed   ??? HX ORTHOPAEDIC      Right knee torn meniscus   ??? HX ORTHOPAEDIC      CTS bilateral , Left thumb trigger finger    ??? HX WRIST FRACTURE TX     ??? LAMINECTOMY,CERVICAL     ??? PR ANESTH,SURGERY OF SHOULDER          Social History   Substance Use Topics   ??? Smoking status: Never Smoker   ??? Smokeless tobacco: Never Used   ??? Alcohol use Yes      Comment: on occasion        Family History   Problem Relation Age of Onset   ??? Diabetes Mother    ??? Diabetes Brother    ??? Cancer Sister    ??? Cancer Maternal Grandmother    ??? Cancer Daughter         Allergies   Allergen Reactions   ??? 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   ??? Almond Other (comments)     Tongue itches   ??? Carvedilol Other (comments)   ??? Cozaar [Losartan] Cough   ??? Crestor [Rosuvastatin] Unknown (comments)     Heart beats fast        Prior to Admission medications    Medication Sig Start Date End Date Taking? Authorizing Provider   diltiazem hcl 360 mg Tb24 Frequency:   Dosage:0.0     Instructions:  Note: 06/11/12  Yes Historical Provider   HYDROcodone-acetaminophen (NORCO) 10-325 mg tablet TAKE 1 TABLET 3 to 4 times daily if needed for pain (105 tablets at most over 30 days) 06/28/14  Yes Historical Provider   simvastatin (ZOCOR) 20 mg tablet Frequency:   Dosage:0.0     Instructions:  Note: 06/06/13  Yes Historical Provider   hydrOXYzine pamoate (VISTARIL) 25 mg capsule Take 25 mg by mouth. 08/03/12  Yes Historical Provider       VITALS:    Visit Vitals   ??? BP 115/87 (BP 1 Location: Right arm, BP Patient Position: Sitting)   ??? Pulse 89   ??? Temp 96.5 ??F (35.8 ??C)   ??? Resp 15   ??? Ht '5\' 4"'$  (1.626 m)   ???  Wt 78 kg (172 lb)   ??? SpO2 96%   ??? BMI 29.52 kg/m2       Labs:      Results for orders placed or performed during the Marsh encounter of 02/02/16   CBC WITH AUTOMATED DIFF   Result Value Ref Range    WBC 4.4 (L) 4.6 - 13.2 K/uL    RBC 4.83 4.20 - 5.30 M/uL    HGB 12.4 12.0 - 16.0 g/dL    HCT 40.3 35.0 - 45.0 %    MCV 83.4 74.0 - 97.0 FL    MCH 25.7 24.0 - 34.0 PG    MCHC 30.8 (L) 31.0 - 37.0 g/dL    RDW 14.9 (H) 11.6 - 14.5 %    PLATELET 359 135 - 420 K/uL    MPV 9.4 9.2 - 11.8 FL    NEUTROPHILS 53 40 - 73 %    LYMPHOCYTES 37 21 - 52 %    MONOCYTES 8 3 - 10 %     EOSINOPHILS 1 0 - 5 %    BASOPHILS 1 0 - 2 %    ABS. NEUTROPHILS 2.3 1.8 - 8.0 K/UL    ABS. LYMPHOCYTES 1.6 0.9 - 3.6 K/UL    ABS. MONOCYTES 0.4 0.05 - 1.2 K/UL    ABS. EOSINOPHILS 0.1 0.0 - 0.4 K/UL    ABS. BASOPHILS 0.0 0.0 - 0.06 K/UL    DF AUTOMATED     METABOLIC PANEL, COMPREHENSIVE   Result Value Ref Range    Sodium 141 136 - 145 mmol/L    Potassium 3.3 (L) 3.5 - 5.5 mmol/L    Chloride 104 100 - 108 mmol/L    CO2 26 21 - 32 mmol/L    Anion gap 11 3.0 - 18 mmol/L    Glucose 116 (H) 74 - 99 mg/dL    BUN 22 (H) 7.0 - 18 MG/DL    Creatinine 0.84 0.6 - 1.3 MG/DL    BUN/Creatinine ratio 26 (H) 12 - 20      GFR est AA >60 >60 ml/min/1.58m    GFR est non-AA >60 >60 ml/min/1.766m   Calcium 9.6 8.5 - 10.1 MG/DL    Bilirubin, total 0.4 0.2 - 1.0 MG/DL    ALT (SGPT) 24 13 - 56 U/L    AST (SGOT) 14 (L) 15 - 37 U/L    Alk. phosphatase 69 45 - 117 U/L    Protein, total 8.3 (H) 6.4 - 8.2 g/dL    Albumin 4.4 3.4 - 5.0 g/dL    Globulin 3.9 2.0 - 4.0 g/dL    A-G Ratio 1.1 0.8 - 1.7     ETHYL ALCOHOL   Result Value Ref Range    ALCOHOL(ETHYL),SERUM 4 (H) 0 - 3 MG/DL   DRUG SCREEN, URINE (HBV ONLY)   Result Value Ref Range    PCP(PHENCYCLIDINE) NEGATIVE  NEG      THC (TH-CANNABINOL) NEGATIVE  NEG      AMPHETAMINE NEGATIVE  NEG      Methamphetamines NEGATIVE       BARBITURATES NEGATIVE  NEG      BENZODIAZEPINE NEGATIVE  NEG      METHADONE NEGATIVE  NEG      COCAINE NEGATIVE  NEG      OPIATES NEGATIVE  NEG      TRICYCLICS POSITIVE (A) NEG      HDSCOM        Specimen analysis was performed without chain of custody handling.  These  results should be used for medical purposes only and not for legal or employment purposes.  Unconfirmed screening results must not be used for non-medical purposes.   CBC WITH AUTOMATED DIFF   Result Value Ref Range    WBC 4.7 4.6 - 13.2 K/uL    RBC 4.51 4.20 - 5.30 M/uL    HGB 11.9 (L) 12.0 - 16.0 g/dL    HCT 37.5 35.0 - 45.0 %    MCV 83.1 74.0 - 97.0 FL    MCH 26.4 24.0 - 34.0 PG     MCHC 31.7 31.0 - 37.0 g/dL    RDW 14.7 (H) 11.6 - 14.5 %    PLATELET 355 135 - 420 K/uL    MPV 9.7 9.2 - 11.8 FL    NEUTROPHILS 48 40 - 73 %    LYMPHOCYTES 43 21 - 52 %    MONOCYTES 8 3 - 10 %    EOSINOPHILS 1 0 - 5 %    BASOPHILS 0 0 - 2 %    ABS. NEUTROPHILS 2.3 1.8 - 8.0 K/UL    ABS. LYMPHOCYTES 2.0 0.9 - 3.6 K/UL    ABS. MONOCYTES 0.4 0.05 - 1.2 K/UL    ABS. EOSINOPHILS 0.1 0.0 - 0.4 K/UL    ABS. BASOPHILS 0.0 0.0 - 0.1 K/UL    DF AUTOMATED     METABOLIC PANEL, COMPREHENSIVE   Result Value Ref Range    Sodium 139 136 - 145 mmol/L    Potassium 3.6 3.5 - 5.5 mmol/L    Chloride 104 100 - 108 mmol/L    CO2 27 21 - 32 mmol/L    Anion gap 8 3.0 - 18 mmol/L    Glucose 131 (H) 74 - 99 mg/dL    BUN 19 (H) 7.0 - 18 MG/DL    Creatinine 0.71 0.6 - 1.3 MG/DL    BUN/Creatinine ratio 27 (H) 12 - 20      GFR est AA >60 >60 ml/min/1.85m    GFR est non-AA >60 >60 ml/min/1.752m   Calcium 9.8 8.5 - 10.1 MG/DL    Bilirubin, total 0.4 0.2 - 1.0 MG/DL    ALT (SGPT) 23 13 - 56 U/L    AST (SGOT) 13 (L) 15 - 37 U/L    Alk. 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   HEENT: No change in vision, no earache, tinnitus, sore throat or sinus congestion.   NECK:  No pain or stiffness.   PULMONARY: No shortness of breath, cough or wheeze.    GASTROINTESTINAL: No abdominal pain, nausea, vomiting or diarrhea, melena or bright red blood per rectum.  GENITOURINARY: No urinary frequency, urgency, hesitancy or dysuria. MUSCULOSKELETAL: No joint or muscle pain, no back pain, no recent trauma. DERMATOLOGIC: No rash, no itching, no lesions.  ENDOCRINE: No polyuria, polydipsia, no heat or cold intolerance. No recent change in weight.   HEMATOLOGICAL: No anemia or easy bruising or bleeding. \\NEUROLOGIC: No headache, seizures, numbness, tingling or weakness. \\denies f/c, pain, n/v, d/c, SOB, CP, weakness/numbness, difficulty urinating    MINI MENTAL STATUS EXAM: :   Orientation- Oriented in all spheres  Short-term memory: shows no evidence of impairment  Attention:Normal  Repeat phrase "no ifs, ands, or buts."  Follow three stage command- follow written command (CLOSE YOUR EYES)\\- write a spontaneous sentence  Copy a simple design      MENTAL STATUS EXAM:  Appearance:shows no evidence of impairment  Behavior: shows no evidence of impairment  Motor: restless and within normal limits  Speech: shows no evidence of impairment  Mood: anxious, depressed, irritable and sad  Affect: anxious, depressed, iritable and sad  Thought Process: is circumstantial  Thought Content: no evidence of impairment  Perception:None  Cognition: impaired decision making  Insight: The patient shows little insight  Judgment: is psychologically impaired and is cognitively impaired    RISK ASSESSMENT:   Prior Attempts: NO  Lethality of Attempts: NO  Weapons at [place] at Home: NO  Alcohol/Drug Use: YES  Protective Factors:adequate family/social support, children, contacts reliable for safety, no organized plan and no means/access to weapons      ASSESSMENT: Patient is a  65 y.o.  African American female who presents with a 2 week history of increased anxiety, worsening depression, poor sleep, with suicidal ideation, and overwhelming feeling of despair. She  follows with [facility], and is prescribed Buspar, Remeron, Xanax, Prozac, and Ambien.      CURRENT MEDICATIONS:     Current Facility-Administered Medications   Medication Dose Route Frequency   ??? dilTIAZem CD (CARDIZEM CD) capsule 360 mg  360 mg Oral DAILY   ??? simvastatin (ZOCOR) tablet 20 mg  20 mg Oral DAILY   ??? DULoxetine (CYMBALTA) capsule 20 mg  20 mg Oral BID   ??? traZODone (DESYREL) tablet 50 mg  50 mg Oral QHS   ??? busPIRone (BUSPAR) tablet 15 mg  15 mg Oral TID   ??? metFORMIN (GLUCOPHAGE) tablet 1,000 mg  1,000 mg Oral BID WITH MEALS   ??? rivaroxaban (XARELTO) tablet 10 mg  10 mg Oral DAILY WITH DINNER       Axis I:MDD, severe, recurrent, w/o psychosis            Generalized Anxiety Disorder             Somatic Symptom Disorder  Axis II: Personality D/O NOS  Axis III: HTN, hyperlipidemia, Asthma, NIDDM II, Hx of PE, Dysphagia; S/P Back surgery revison             laminectomy with rods/screws/plates.  Axis UX:NATF stressors, chronic medical illnesses, inadequate income  Axis V: 40     Plan:  1. Continue with inpatient psychiatric treatment  2. Continue with suicide or assault precautions  3. Patient is to continue with Art/OT and family therapy sessions  4. Will need to talk with outpatient psychiatrist/therapist for more collateral  5. Will need to talk with parents for  more collateral  6. Medications: Start Cymbalta 20 mg po BID, target dose of 30 mg BID; Consider Effexor if thsi fails.                            Resume Buspar at 15 mg tid;                            Trazodone for sleep.                             D/C Remeron, Prozac, Ambien.                             Resume Home meds; on a high dose of Oxycodone. Not recommended. Reduce.             Xanax HIGHLY NOT RECOMMENDED.                               On Xarelto for PE. Dose is 10 mg, usual dose is 20 mg. Pharmacy called. I see nowhere in chart review where this dose was  adjusted. Will call Hospitalist and ask for advice-re she should be on '20mg'$ . Increase dose.  7. Labs: None needed so far  8. SW to help with disposition      Disposition:  Home w/Family           ___________________________________________________    Attending Physician: Fredia Beets, MD

## 2016-02-03 NOTE — Behavioral Health Treatment Team (Signed)
SW Contact: Pt. Is a 65 year old female with history of anxiety and depression .  Pt. Was admitted to this facility for ideations to harm self, increased anxiety, decrease appetite and sleep disturbance.   SW met with the pt to discuss d/c planning. Pt. Stated she has been receiving mental health outpt services with [facility]. Pt stated she contacted her provider to tell them she feels as though the medication  Have not been working for her. Pt stated she could not get an early appointment. Pt stated was so depressed she could not function. Pt admits she did not want to get out of bed.  SW discussed self-efficacy, safety plan , positive coping skill and discussed mental health community resources such as mental health supports and or skill building.

## 2016-02-03 NOTE — Behavioral Health Treatment Team (Signed)
Cardizem and Zocor new orders, shows but not loaded in pyxis. Message sent to pharmacy

## 2016-02-03 NOTE — Other (Signed)
OCCUPATIONAL THERAPY PROGRESS NOTE  Group Time:  1530  Attendance:    The patient attended full group.  Participation:  The patient participated with moderate elaboration in the activity.  Attention:  The patient was able to focus on the activity.  Interaction:  The patient occasionally  interacts with others.  Limited disclosure.  Participated as asked and appropriately.

## 2016-02-03 NOTE — Behavioral Health Treatment Team (Cosign Needed)
Pt has been in day area all shift.Participated in group and responsive on approach.Did complain of some aches   but overall compliant on unit.Ambulating well with use of walker and denies any self harm thoughts.She was encouraged   to talk with doctor about any other issues of importance to her.

## 2016-02-04 ENCOUNTER — Inpatient Hospital Stay: Admit: 2016-02-04 | Payer: PRIVATE HEALTH INSURANCE | Primary: Family Medicine

## 2016-02-04 LAB — GLUCOSE, POC
Glucose (POC): 144 mg/dL — ABNORMAL HIGH (ref 70–110)
Glucose (POC): 106 mg/dL (ref 70–110)

## 2016-02-04 MED ORDER — DULOXETINE 30 MG CAP, DELAYED RELEASE
30 mg | Freq: Two times a day (BID) | ORAL | Status: DC
Start: 2016-02-04 — End: 2016-02-05
  Administered 2016-02-04 – 2016-02-05 (×2): via ORAL

## 2016-02-04 MED ORDER — TRAMADOL 50 MG TAB
50 mg | Freq: Three times a day (TID) | ORAL | Status: DC | PRN
Start: 2016-02-04 — End: 2016-02-05
  Administered 2016-02-05: 13:00:00 via ORAL

## 2016-02-04 MED ORDER — TRAZODONE 100 MG TAB
100 mg | Freq: Every evening | ORAL | Status: DC
Start: 2016-02-04 — End: 2016-02-05
  Administered 2016-02-05: 01:00:00 via ORAL

## 2016-02-04 MED FILL — BUSPIRONE 5 MG TAB: 5 mg | ORAL | Qty: 3

## 2016-02-04 MED FILL — TRAZODONE 50 MG TAB: 50 mg | ORAL | Qty: 1

## 2016-02-04 MED FILL — DULOXETINE 20 MG CAP, DELAYED RELEASE: 20 mg | ORAL | Qty: 1

## 2016-02-04 MED FILL — METFORMIN 500 MG TAB: 500 mg | ORAL | Qty: 2

## 2016-02-04 MED FILL — CYMBALTA 30 MG CAPSULE,DELAYED RELEASE: 30 mg | ORAL | Qty: 1

## 2016-02-04 MED FILL — DILTIAZEM ER 180 MG 24 HR CAP: 180 mg | ORAL | Qty: 2

## 2016-02-04 MED FILL — XARELTO 20 MG TABLET: 20 mg | ORAL | Qty: 1

## 2016-02-04 NOTE — Behavioral Health Treatment Team (Signed)
Cynthia Marsh is  participating in [place].     Group time: 15 minutes    Personal goal for participation: Community announcement    Goal orientation: community    Group therapy participation: fully participated    Therapeutic interventions reviewed and discussed: Staff encouraged Pt. To report any maintenance/housekeeping or community concerns to staff so it can be addressed.    Impression of participation: Pt. Did not have any maintenance/housekeeping or community concerns to report to staff .

## 2016-02-04 NOTE — Behavioral Health Treatment Team (Signed)
PT stated that she's had a great day. Doing very well since two of her medications were changed. PT stated she thinks her body grew a tolerance to her previous medications, that she was on for seven years, PT  and that is why they weren't working as well. PT ate half of her dinner-jokingly stated that she'd rather have "some soul food". PT visited with her husband today-visit went very well. PT informed this Clinical research associatewriter that her husband really misses her and her cooking. PT husband is lonely without her in the household. PT is in great spirits-ready for discharge tomorrow. [place],  and spend time with her children and grandchildren. PT is ambulating very well. No S/I. No thoughts of harming others.

## 2016-02-04 NOTE — Behavioral Health Treatment Team (Signed)
Patient received a visit from her boyfriend, visit went well.

## 2016-02-04 NOTE — Behavioral Health Treatment Team (Signed)
Pt attended goal group in which she discussed her physical issues and dealing with the pain.Reports is scheduled to have   MRI done today to see if there has been a change in the growth.Taking each day at a time.
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
## 2016-02-04 NOTE — Other (Signed)
OCCUPATIONAL THERAPY PROGRESS NOTE  Group Time:  1530  Attendance:    The patient attended full group.  Participation:  The patient participated fully in the activity.  Attention:  The patient was able to focus on the activity.  Interaction:  The patient occasionally  interacts with others.  fPartaicipated in discussion on stress management.  Affect seemed slightly brighter today.

## 2016-02-04 NOTE — Behavioral Health Treatment Team (Signed)
GROUP THERAPY PROGRESS NOTE    Cynthia PorteousFrankie M Marsh is not participating in AA Group    Group time: 1hour  Personal goal for participation:  Seek information on Alcohol      Goal orientation: active  Group therapy participation:   Fully participated    Therapeutic interventions reviewed and discussed: Staff encouraged Pt. To participate in Group    Impression of participation:   Pt. Refuse chose to sat at the table in the day area

## 2016-02-04 NOTE — Progress Notes (Signed)
Problem: Suicide/Homicide (Adult/Pediatric)  Goal: *STG: Remains safe in hospital  Patient will remain safe during hospital stay.   Outcome: Progressing Towards Goal  Patient remains safe while hospitalized.   Goal: *STG: Attends activities and groups  AEB attending at least three groups/activities per day while hospitalized.   Outcome: Progressing Towards Goal  Patient has attended groups today.    Problem: Falls - Risk of  Goal: *Absence of falls  Patient will remain fall free throughout hospital stay.   Outcome: Progressing Towards Goal  Patient remains free of falls and ambulates with her rollator.     Comments:   Patient has been in the milieu all morning, eating breakfast and attending groups. Patient states that she needs to work on her coping skills, as every time something happens in her life to her medically, she does not cope well. Patient describes a recent PE and that is how she ended up being depressed because she cannot control these things and that makes her feel hopeless. Patient states the last time she had these feeling was when she fell at her job and hurt her back and became disabled. Patient states that she cannot deal with things out of her control and that makes her feel helpless. Patient is willing to talk about her experiences and she is also talking to peers occasionally.

## 2016-02-05 ENCOUNTER — Encounter

## 2016-02-05 LAB — GLUCOSE, POC: Glucose (POC): 138 mg/dL — ABNORMAL HIGH (ref 70–110)

## 2016-02-05 MED ORDER — RIVAROXABAN 20 MG TAB
20 mg | ORAL_TABLET | Freq: Every day | ORAL | 0 refills | Status: AC
Start: 2016-02-05 — End: 2016-03-06

## 2016-02-05 MED ORDER — TRAZODONE 100 MG TAB
100 mg | ORAL_TABLET | Freq: Every evening | ORAL | 0 refills | Status: AC
Start: 2016-02-05 — End: 2016-03-06

## 2016-02-05 MED ORDER — METFORMIN 1,000 MG TAB
1000 mg | ORAL_TABLET | Freq: Two times a day (BID) | ORAL | 0 refills | Status: AC
Start: 2016-02-05 — End: 2016-03-06

## 2016-02-05 MED ORDER — BUSPIRONE 15 MG TAB
15 mg | ORAL_TABLET | Freq: Three times a day (TID) | ORAL | 0 refills | Status: AC
Start: 2016-02-05 — End: 2016-03-06

## 2016-02-05 MED ORDER — DULOXETINE 30 MG CAP, DELAYED RELEASE
30 mg | ORAL_CAPSULE | Freq: Two times a day (BID) | ORAL | 0 refills | Status: AC
Start: 2016-02-05 — End: 2016-03-06

## 2016-02-05 MED FILL — TRAZODONE 100 MG TAB: 100 mg | ORAL | Qty: 1

## 2016-02-05 MED FILL — BUSPIRONE 5 MG TAB: 5 mg | ORAL | Qty: 3

## 2016-02-05 MED FILL — TRAMADOL 50 MG TAB: 50 mg | ORAL | Qty: 1

## 2016-02-05 MED FILL — CYMBALTA 30 MG CAPSULE,DELAYED RELEASE: 30 mg | ORAL | Qty: 1

## 2016-02-05 MED FILL — DILTIAZEM ER 180 MG 24 HR CAP: 180 mg | ORAL | Qty: 2

## 2016-02-05 MED FILL — METFORMIN 500 MG TAB: 500 mg | ORAL | Qty: 2

## 2016-02-05 NOTE — Behavioral Health Treatment Team (Signed)
GROUP THERAPY PROGRESS NOTE    Cynthia PorteousFrankie M Marsh is participating in [facility].     Group time: 30 minutes  Discussed any concerns they have on the unit.anpd procedure for discharge.Informed of keeping   follow up appointments and taking meds as prescribed.Contact doctor if there are any issues.

## 2016-02-05 NOTE — Discharge Summary (Signed)
[facility]  Discharge Summary     Patient ID:  Cynthia Marsh  [id number]  65 y.o.  1951-01-11    Admit date: 02/02/2016    Discharge date and time:      Admission Diagnoses: Depression    Discharge Diagnoses:   Axis I:  MDD, severe, recurrent, w/o psychosis  Generalized Anxiety Disorder  Somatic Symptom Disorder  Axis II: Personality D/O NOS  Axis III: HTN, hyperlipidemia, Asthma, NIDDM II, Hx of PE, Dysphagia; S/P Back surgery revison laminectomy with rods/screws/plates.  Axis WG:NFAO stressors, chronic medical illnesses, inadequate income  Axis V: 60    Problem List as of 02/05/2016  Date Reviewed: 02-17-16          Codes Class Noted - Resolved    History of lumbar fusion, L4-sacrum ICD-10-CM: Z98.890  ICD-9-CM: V45.4  01/11/2016 - Present        Encephalopathy acute ICD-10-CM: G93.40  ICD-9-CM: 348.30  12/05/2015 - Present        Hypokalemia ICD-10-CM: E87.6  ICD-9-CM: 276.8  12/03/2015 - Present        Ataxia ICD-10-CM: R27.0  ICD-9-CM: 781.3  12/03/2015 - Present        Altered mental status ICD-10-CM: R41.82  ICD-9-CM: 780.97  12/03/2015 - Present        Pulmonary emboli (HCC) ICD-10-CM: I26.99  ICD-9-CM: 415.19  11/07/2015 - Present        Atypical chest pain ICD-10-CM: R07.89  ICD-9-CM: 786.59  11/06/2015 - Present        Acute pulmonary embolism (HCC) ICD-10-CM: I26.99  ICD-9-CM: 415.19  11/06/2015 - Present        Hx of spinal fusion (Chronic) ICD-10-CM: Z98.1  ICD-9-CM: V45.4  08/17/2015 - Present        Intractable migraine without aura and without status migrainosus ICD-10-CM: G43.019  ICD-9-CM: 346.11  08/06/2015 - Present        Depression ICD-10-CM: F32.9  ICD-9-CM: 311  Unknown - Present        Anxiety ICD-10-CM: F41.9  ICD-9-CM: 300.00  Unknown - Present        Insomnia ICD-10-CM: G47.00  ICD-9-CM: 780.52  Unknown - Present        Mild intermittent asthma without complication ICD-10-CM: J45.20  ICD-9-CM: 493.90  04/20/2015 - Present        Chronic pain of right knee ICD-10-CM: M25.561, G89.29   ICD-9-CM: 719.46, 338.29  04/20/2015 - Present        Chronic bilateral low back pain without sciatica ICD-10-CM: M54.5, G89.29  ICD-9-CM: 724.2, 338.29  04/20/2015 - Present        Type 2 diabetes mellitus without complication (HCC) ICD-10-CM: E11.9  ICD-9-CM: 250.00  04/20/2015 - Present        Essential hypertension with goal blood pressure less than 140/90 ICD-10-CM: I10  ICD-9-CM: 401.9  04/20/2015 - Present              Disposition: home    Discharged Condition: good    Past Medical History:   Diagnosis Date   ??? Anxiety    ??? Asthma    ??? 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 Cardiac echocardiogram 11/07/2015    EF 60%.  No RWMA.  Gr 1 DDfx.  RVSP 30 mmHg.     ??? Cardiovascular lower extremity venous duplex 11/06/2015    No DVT bilaterally.   ??? Chronic kidney disease    ??? Depression    ??? Diabetes (HCC)    ???  Hypertension    ??? Insomnia    ??? Osteoporosis    ??? Pneumonia 2010   ??? Thromboembolus (HCC)       Family History   Problem Relation Age of Onset   ??? Diabetes Mother    ??? Diabetes Brother    ??? Cancer Sister    ??? Cancer Maternal Grandmother    ??? Cancer Daughter       Social History   Substance Use Topics   ??? Smoking status: Never Smoker   ??? Smokeless tobacco: Never Used   ??? Alcohol use Yes      Comment: on occasion     Past Surgical History:   Procedure Laterality Date   ??? BREAST SURGERY PROCEDURE UNLISTED       Left cyst removed    ??? HAND/FINGER SURGERY UNLISTED     ??? HX BACK SURGERY  2012    L4/L5/S1 fused in [place]   ??? HX CYST INCISION AND DRAINAGE Left    ??? HX CYST REMOVAL      calf    ??? HX GYN      complete hyst    ??? HX GYN      tubal ligation   ??? HX HYSTERECTOMY     ??? HX OOPHORECTOMY     ??? HX ORTHOPAEDIC      Right shoulder rotator cuff    ??? HX ORTHOPAEDIC      Left Little toe hammer toe, left big toe bunion removed   ??? HX ORTHOPAEDIC      Right knee torn meniscus   ??? HX ORTHOPAEDIC      CTS bilateral , Left thumb trigger finger    ??? HX WRIST FRACTURE TX     ??? LAMINECTOMY,CERVICAL      ??? PR ANESTH,SURGERY OF SHOULDER        Prior to Admission medications    Medication Sig Start Date End Date Taking? Authorizing Provider   busPIRone (BUSPAR) 15 mg tablet Take 1 Tab by mouth three (3) times daily for 30 days. Indications: GENERALIZED ANXIETY DISORDER 02/05/16 03/06/16 Yes Carlette Palmatier Shea Stakes, MD   DULoxetine (CYMBALTA) 30 mg capsule Take 1 Cap by mouth two (2) times a day for 30 days. Indications: GENERALIZED ANXIETY DISORDER, major depressive disorder, NEUROPATHIC PAIN 02/05/16 03/06/16 Yes [place] Welcome Shea Stakes, MD   metFORMIN (GLUCOPHAGE) 1,000 mg tablet Take 1 Tab by mouth two (2) times daily (with meals) for 30 days. Indications: type 2 diabetes mellitus 02/05/16 03/06/16 Yes Kaitlin Alcindor Shea Stakes, MD   rivaroxaban (XARELTO) 20 mg tab tablet Take 1 Tab by mouth daily (with dinner) for 30 days. Indications: PREVENTION OF PULMONARY THROMBOEMBOLISM RECURRENCE 02/05/16 03/06/16 Yes Alistar Mcenery Shea Stakes, MD   traZODone (DESYREL) 100 mg tablet Take 1 Tab by mouth nightly for 30 days. Indications: insomnia associated with depression 02/05/16 03/06/16 Yes Denene Alamillo Shea Stakes, MD   diltiazem hcl 360 mg Tb24 Frequency:   Dosage:0.0     Instructions:  Note: 06/11/12  Yes Historical Provider   HYDROcodone-acetaminophen (NORCO) 10-325 mg tablet TAKE 1 TABLET 3 to 4 times daily if needed for pain (105 tablets at most over 30 days) 06/28/14  Yes Historical Provider   simvastatin (ZOCOR) 20 mg tablet Frequency:   Dosage:0.0     Instructions:  Note: 06/06/13  Yes Historical Provider   hydrOXYzine pamoate (VISTARIL) 25 mg capsule Take 25 mg by mouth. 08/03/12  Yes Historical Provider     Allergies   Allergen Reactions   ???  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   ??? Almond Other (comments)     Tongue itches   ??? Carvedilol Other (comments)   ??? Cozaar [Losartan] Cough   ??? Crestor [Rosuvastatin] Unknown (comments)     Heart beats fast      Hospital Course:   The patient was admitted to the special treatment unit on suicide precautions. Patient was started on Cymbalta BID 20 mg, Buspar resumed at 15 mg TID, and given trazodone for sleep. Prozac, Ambien was discontinued. She came in on Xarelto for her hx of PE of 10 mg. Pharm thought it should be 20 mg. Past record preview showed it should be 20 mg last by Jan 2017. It was agreed and we kept it at 20 mg. Trazodone needed to be adjusted a couple of times for maximal efficacy. She said she was doing very well since two of her medications were changed-Prozac 40 mg, and Ambien 10 mg HS. Patient stated she thinks her body grew a tolerance to her previous medications, that she was on for seven years, PT and that is why they weren't working as well. Precautions were discontinued on the next day and patient was transferred to the open Adult Unit subsequently. . The patient was engaged in individual, group, and indivudual therapies, and occupational therapy. Her c/o of pain was addressed with another CT scan recommended after her last MRI of 08/2015. There was no interval changes. The cyst she was worried about on her sacrum was incidental, and to be monitored routinely. She was happy about that.     At the time of discharge, the patient denied homicidal or suicidal ideation. Patient was not psychotic and was capable of self care and competent to make their own financial and medical decisions. The patient has an understanding of treatment recommendations and medication management on discharge.     Discharge Exams:  Mental Status exam: WNL   Physical exam: None required on discharge    Lab/Data Review:  All lab results for the last 24 hours reviewed.      Consultations (including impressions and outcomes): None    Psychiatric Testing ; on discharge, reality based.    Treatment and Response: Excellent    Significant adverse reaction to drugs: none    Procedures/Operations: None    Current Discharge Medication List       START taking these medications    Details   busPIRone (BUSPAR) 15 mg tablet Take 1 Tab by mouth three (3) times daily for 30 days. Indications: GENERALIZED ANXIETY DISORDER  Qty: 90 Tab, Refills: 0      DULoxetine (CYMBALTA) 30 mg capsule Take 1 Cap by mouth two (2) times a day for 30 days. Indications: GENERALIZED ANXIETY DISORDER, major depressive disorder, NEUROPATHIC PAIN  Qty: 60 Cap, Refills: 0      metFORMIN (GLUCOPHAGE) 1,000 mg tablet Take 1 Tab by mouth two (2) times daily (with meals) for 30 days. Indications: type 2 diabetes mellitus  Qty: 60 Tab, Refills: 0      rivaroxaban (XARELTO) 20 mg tab tablet Take 1 Tab by mouth daily (with dinner) for 30 days. Indications: PREVENTION OF PULMONARY THROMBOEMBOLISM RECURRENCE  Qty: 30 Tab, Refills: 0      traZODone (DESYREL) 100 mg tablet Take 1 Tab by mouth nightly for 30 days. Indications: insomnia associated with depression  Qty: 30 Tab, Refills: 0         CONTINUE these medications which have  NOT CHANGED    Details   diltiazem hcl 360 mg Tb24 Frequency:   Dosage:0.0     Instructions:  Note:      hydrOXYzine pamoate (VISTARIL) 25 mg capsule Take 25 mg by mouth.         STOP taking these medications       HYDROcodone-acetaminophen (NORCO) 10-325 mg tablet Comments:   Reason for Stopping:         simvastatin (ZOCOR) 20 mg tablet Comments:   Reason for Stopping:                 Activity: Activity as tolerated  Diet: Low fat, Low cholesterol      Follow-up with:  Pt. Receive outpt services with [facility] [address] [place], [place] [address] phone [phone] has a counseling appointment on 02/18/16 @ 1:00      -and medication appointment for 03/05/16 with Toney Rakes for medication management [facility] [address] [place], [place] [address] phone: [phone]    Copy of D/C summary to: Marvia Pickles, MD     Signed:  Toy Care, MD  02/05/2016  10:21 AM

## 2016-02-05 NOTE — Behavioral Health Treatment Team (Signed)
Patient has been discharged to self and will [facility] and [facility] (Counseling and Medication Management). Patient has been discharged with scripts and follow up appointments in place. Patient has been educated and provided with additional emergency numbers to seek additional support if in need. Patient has signed discharge paper work with property returned. Patient has been escorted off unit to awaiting transportation for transport to home.

## 2016-02-05 NOTE — Behavioral Health Treatment Team (Signed)
SW discussed the case with the treating psychiatrist    SW Contact: Pt. Is a 65 year old female with history of anxiety and depression . Pt. Was admitted to this facility for ideations to harm self, increased anxiety, decrease appetite and sleep disturbance. SW met with the pt to discuss d/c planning. The pt. Plans to return home to her admitting address. Pt denies ideations and hallucinations. Pt plans to follow-up aftercare with [facility] [address] [place] [address] phone [phone] has a counseling appointment on 02/18/16 @ 1:00   and medication appointment for 03/05/16 Prudencio Burly for medication management [facility] [address] [place] [address] phone [phone] has a counseling appointment on 02/18/16 @ 1:00   and medication appointment for 03/05/16 Prudencio Burly for medication management.

## 2016-02-05 NOTE — Progress Notes (Signed)
Behavioral Health Progress Note      02/05/2016    Cynthia Marsh    Current Diagnosis:    Axis I:  MDD, severe, recurrent, w/o psychosis  Generalized Anxiety Disorder  Somatic Symptom Disorder  Axis II: Personality D/O NOS  Axis III: HTN, hyperlipidemia, Asthma, NIDDM II, Hx of PE, Dysphagia; S/P Back surgery revison laminectomy with rods/screws/plates.  Axis ZO:XWRU stressors, chronic medical illnesses, inadequate income  Axis V: 60      Report from the nursing staff changes in the medical condition while patient has been on the unit:  "PT stated that she's had a great day. Doing very well since two of her medications were changed. PT stated she thinks her body grew a tolerance to her previous medications, that she was on for seven years, PT and that is why they weren't working as well.".     Recent Results (from the past 24 hour(s))   GLUCOSE, POC    Collection Time: 02/04/16  4:07 PM   Result Value Ref Range    Glucose (POC) 106 70 - 110 mg/dL   GLUCOSE, POC    Collection Time: 02/05/16  5:58 AM   Result Value Ref Range    Glucose (POC) 138 (H) 70 - 110 mg/dL       Sleep:shows no evidence of impairment    Intake: Good    Patient Comments:  She says she continues to feel a lot better since her arrival.   Has tolerated Cymbalta so far w/o any issues. Mood has improved, and she feels ready to go home today.    She said she did not sleep well last night, even on 100 mg trazodone, unfortunately she did not have any PRN trazodone to use, and the on call MD was not called for her. Overall she feels better " than i have in 2 years", and wants to go home today. She is a voluntary admission.    Mental Status Exam:    Unremarkable except; Logical, coherent, goal-directed, non-delusional; soft speech, non-spontaneous; guarded; no perceptual abnormalities, no SI/HI, improving insight( "i should have done this a long time ago"), no victimstance/blaming ideology.      Medications:     Current Facility-Administered Medications   Medication Dose Route Frequency   ??? DULoxetine (CYMBALTA) capsule 30 mg  30 mg Oral BID   ??? traZODone (DESYREL) tablet 100 mg  100 mg Oral QHS   ??? dilTIAZem CD (CARDIZEM CD) capsule 360 mg  360 mg Oral DAILY   ??? busPIRone (BUSPAR) tablet 15 mg  15 mg Oral TID   ??? rivaroxaban (XARELTO) tablet 20 mg  20 mg Oral DAILY WITH DINNER   ??? metFORMIN (GLUCOPHAGE) tablet 1,000 mg  1,000 mg Oral BID WITH MEALS         Treatment Plan:  1. D/Continue with inpatient psychiatric treatment  2. D/Continue with suicide or assault precautions  3. Patient is to continue with Art/OT and family therapy sessions  4. Medications: Cymbalta to 30 mg po BID, target dose of 30 mg BID;    Buspar at 15 mg tid;  -IncreasedTrazodone to 150 mg po hs for sleep;  Resume Home meds; on a high dose of Oxycodone. Not recommended. Will change to PRN. Interestingly she has not needed a single dose, and says she has not used it in 2 weeks now. D/C so it is not an option to use. Replace with Motrin at home if needed. She is also scheduled to follow  with pain management on discharge.  On Xarelto for PE. Dose is 10 mg, usual dose is 20 mg.   7. Labs: None needed so far. Imaging; get a CT lumbar spine to assess for encroachment of disk spacers on spinal cord.  Result:   IMPRESSION::   ??  Status post ALIF at L4-5 and L5-S1 with bilateral posterior fixation and fusion  L4-S1 with L5 decompressive laminectomy.  ??  Mild disc space narrowing L2-3. Canal and foramina are patent throughout.    No mention was made about the Tavlov cyst as requested.    -SW to help with disposition    Anticipated Discharge:  Today. Pt. [facility]pt [facility]    Kahleb Mcclane A Laurie Lovejoy, MD  02/05/2016

## 2016-02-08 ENCOUNTER — Encounter: Attending: Internal Medicine | Primary: Family Medicine

## 2016-02-09 ENCOUNTER — Telehealth

## 2016-02-09 MED ORDER — OXYCODONE-ACETAMINOPHEN 10 MG-325 MG TAB
10-325 mg | ORAL_TABLET | Freq: Four times a day (QID) | ORAL | 0 refills | Status: DC | PRN
Start: 2016-02-09 — End: 2016-04-27

## 2016-02-09 NOTE — Telephone Encounter (Signed)
Per previous doc under referral pt requested that notes be sent to Dr topali's office.    Re-faxed notes to Dr winke's office.

## 2016-02-09 NOTE — Telephone Encounter (Signed)
Pt called Dr. Nelva BushWinke office and they stated that they need medical records sent over and that Dr. Shawnie PonsHunte need to prescribe pain meds unitl pt get in their office.    Pt would like to request oxycodone 10-325 mg

## 2016-02-11 NOTE — Telephone Encounter (Signed)
Patient has an appointment with [facility].  Dr. Nelva BushWinke did not accept patients insurance.  Left message for patient prescription is ready for pick up at the front office.  Any questions call the office.

## 2016-02-19 ENCOUNTER — Ambulatory Visit: Admit: 2016-02-19 | Discharge: 2016-02-19 | Payer: MEDICARE | Attending: Physician Assistant | Primary: Family Medicine

## 2016-02-19 DIAGNOSIS — M1711 Unilateral primary osteoarthritis, right knee: Secondary | ICD-10-CM

## 2016-02-19 NOTE — Progress Notes (Signed)
[facility] [address]  [address]  [place] [address]  [phone]           Patient: Cynthia Marsh                MRN: [id number]       SSN: [id number]  Date of Birth: [birth year]        AGE: 65 y.o.        SEX: female  Body mass index is 31.93 kg/(m^2).    PCP: Marvia Pickles, MD  02/19/16      This office note has been dictated.      REVIEW OF SYSTEMS:  Constitutional: Negative for fever, chills, weight loss and malaise/fatigue.   HENT: Negative.    Eyes: Negative.    Respiratory: Negative.   Cardiovascular: Negative.   Gastrointestinal: No bowel incontinence or constipation.  Genitourinary: No bladder incontinence or saddle anesthesia.  Skin: Negative.   Neurological: Negative.    Endo/Heme/Allergies: Negative.    Psychiatric/Behavioral: Negative.  Musculoskeletal: As per HPI above.     Past Medical History:   Diagnosis Date   ??? Anxiety    ??? Asthma    ??? 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 Cardiac echocardiogram 11/07/2015    EF 60%.  No RWMA.  Gr 1 DDfx.  RVSP 30 mmHg.     ??? Cardiovascular lower extremity venous duplex 11/06/2015    No DVT bilaterally.   ??? Chronic kidney disease    ??? Depression    ??? Diabetes (HCC)    ??? Hypertension    ??? Insomnia    ??? Osteoporosis    ??? Pneumonia 2010   ??? Thromboembolus [facility])          Current Outpatient Prescriptions:   ???  oxyCODONE-acetaminophen (PERCOCET 10) 10-325 mg per tablet, Take 1 Tab by mouth every six (6) hours as needed for Pain. Max Daily Amount: 4 Tabs., Disp: 90 Tab, Rfl: 0  ???  busPIRone (BUSPAR) 15 mg tablet, Take 1 Tab by mouth three (3) times daily for 30 days. Indications: GENERALIZED ANXIETY DISORDER, Disp: 90 Tab, Rfl: 0  ???  DULoxetine (CYMBALTA) 30 mg capsule, Take 1 Cap by mouth two (2) times a day for 30 days. Indications: GENERALIZED ANXIETY DISORDER, major depressive disorder, NEUROPATHIC PAIN, Disp: 60 Cap, Rfl: 0  ???  metFORMIN (GLUCOPHAGE) 1,000 mg tablet, Take 1 Tab by mouth two (2)  times daily (with meals) for 30 days. Indications: type 2 diabetes mellitus, Disp: 60 Tab, Rfl: 0  ???  rivaroxaban (XARELTO) 20 mg tab tablet, Take 1 Tab by mouth daily (with dinner) for 30 days. Indications: PREVENTION OF PULMONARY THROMBOEMBOLISM RECURRENCE, Disp: 30 Tab, Rfl: 0  ???  traZODone (DESYREL) 100 mg tablet, Take 1 Tab by mouth nightly for 30 days. Indications: insomnia associated with depression (Patient taking differently: Take 150 mg by mouth nightly. Indications: insomnia associated with depression), Disp: 30 Tab, Rfl: 0  ???  diltiazem hcl 360 mg Tb24, Frequency:   Dosage:0.0     Instructions:  Note:, Disp: , Rfl:   ???  hydrOXYzine pamoate (VISTARIL) 25 mg capsule, Take 25 mg by mouth., Disp: , Rfl:     Allergies   Allergen Reactions   ??? 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   ??? Almond Other (comments)     Tongue itches   ??? Carvedilol Other (comments)   ???  Cozaar [Losartan] Cough   ??? Crestor [Rosuvastatin] Unknown (comments)     Heart beats fast       Social History     Social History   ??? Marital status: SINGLE     Spouse name: N/A   ??? Number of children: N/A   ??? Years of education: N/A     Occupational History   ??? Not on file.     Social History Main Topics   ??? Smoking status: Never Smoker   ??? Smokeless tobacco: Never Used   ??? Alcohol use Yes      Comment: on occasion   ??? Drug use: No   ??? Sexual activity: Not on file     Other Topics Concern   ??? Not on file     Social History Narrative       Past Surgical History:   Procedure Laterality Date   ??? BREAST SURGERY PROCEDURE UNLISTED       Left cyst removed    ??? HAND/FINGER SURGERY UNLISTED     ??? HX BACK SURGERY  2012    L4/L5/S1 fused in [place]   ??? HX CYST INCISION AND DRAINAGE Left    ??? HX CYST REMOVAL      calf    ??? HX GYN      complete hyst    ??? HX GYN      tubal ligation   ??? HX HYSTERECTOMY     ??? HX OOPHORECTOMY     ??? HX ORTHOPAEDIC      Right shoulder rotator cuff    ??? HX ORTHOPAEDIC       Left Little toe hammer toe, left big toe bunion removed   ??? HX ORTHOPAEDIC      Right knee torn meniscus   ??? HX ORTHOPAEDIC      CTS bilateral , Left thumb trigger finger    ??? HX WRIST FRACTURE TX     ??? LAMINECTOMY,CERVICAL     ??? PR ANESTH,SURGERY OF SHOULDER               We did see Cynthia Marsh for follow-up in regards to her right knee. The patient does have known advanced arthritis with a valgus deformity. The patient does have near bone-on-bone eburnation with joint space narrowing of the knee.  She had a series of viscosupplementation, which actually worked quite well for her.  Her pain level is a 0 out of 10. The knee is not affecting her at this point.  She is able to do most of her daily activities without complications.      She is currently taking no antiinflammatories.  She has had no recent fevers, chills, systemic changes, or injuries to report.     PHYSICAL EXAMINATION: In general the patient is alert and oriented x 3 and is in no acute distress.  The patient is well-developed and well-nourished with a normal affect.  The patient is afebrile. HEENT:  Head is normocephalic and atraumatic.  Pupils are equally round and reactive to light and accommodation.  Extraocular eye movements are intact.  Neck is supple.  Trachea is midline. No JVD is present.  Breathing is nonlabored.   Examination of the lower extremities reveals pain free range of motion of the hips.  There is no pain with palpation to the trochanteric bursae.  There is negative straight leg raise. There is negative calf tenderness and swelling.  There is negative Homans.  There is no evidence of DVT  noted. Examination of the right knee reveals the skin to be intact.  There is no erythema or ecchymosis.  There is no warmth or signs for infection or cellulitis.  There is negative joint effusion and negative patellar ballottement.  There is no pain with palpation about the knee. She does  have some crepitus anteriorly with range of motion activities.  A valgus deformity is present.      RADIOGRAPHS:  Review of previous radiographs does confirm near end stage arthritis of the right knee with a valgus collapse pattern.      ASSESSMENT:  Right knee advanced osteoarthritis.     PLAN:  At this point the patient did well with conservative viscosupplementation. We will plan on seeing her back in six months??? time for reevaluation, at which point we can repeat the series for her. If she has any discomfort prior to that we have discussed Cortisone injections as well.                  JR Irais Mottram MPAS, PA-C, ATC

## 2016-03-03 ENCOUNTER — Encounter: Attending: Internal Medicine | Primary: Family Medicine

## 2016-03-10 ENCOUNTER — Encounter: Attending: Internal Medicine | Primary: Family Medicine

## 2016-03-14 ENCOUNTER — Ambulatory Visit: Admit: 2016-03-14 | Discharge: 2016-03-14 | Payer: MEDICARE | Attending: Pulmonary Disease | Primary: Family Medicine

## 2016-03-14 DIAGNOSIS — I2699 Other pulmonary embolism without acute cor pulmonale: Secondary | ICD-10-CM

## 2016-03-14 NOTE — Patient Instructions (Signed)
Continue Xarelto  Call for symptoms such as increasing shortness of breath

## 2016-03-14 NOTE — Progress Notes (Signed)
[facility]  Pulmonary, Critical Care, and Sleep Medicine      Chief complaint:  Pulmonary embolus    HPI:  Horace PorteousFrankie M Holaday is 65 years old and returns the office today for followup concerning pulmonary embolus.  The patient had negative PPD L studies for DVT but was having symptoms of right knee discomfort and swelling previous to the PE.  She is now on anticoagulation denies any chest pain except chest discomfort in her upper chest when she lays down it is nonpleuritic she also has a dry cough and relates shortness of breath occasionally with exertion she denies any ankle swelling presently  Allergies   Allergen Reactions   ??? 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   ??? Almond Other (comments)     Tongue itches   ??? Carvedilol Other (comments)   ??? Cozaar [Losartan] Cough   ??? Crestor [Rosuvastatin] Unknown (comments)     Heart beats fast     Current Outpatient Prescriptions   Medication Sig   ??? XARELTO 20 mg tab tablet Take 1 Tab by mouth daily.   ??? amLODIPine (NORVASC) 10 mg tablet Take 1 Tab by mouth daily.   ??? azelastine (ASTELIN) 137 mcg (0.1 %) nasal spray 1 Spray by Both Nostrils route two (2) times a day.   ??? QVAR 80 mcg/actuation aero Take 1 Puff by inhalation daily.   ??? cyclobenzaprine (FLEXERIL) 10 mg tablet Take 0.5 Tabs by mouth two (2) times a day.   ??? DULoxetine (CYMBALTA) 30 mg capsule Take 1 Cap by mouth daily.   ??? fluticasone (FLONASE) 50 mcg/actuation nasal spray 2 Sprays by Both Nostrils route nightly.   ??? hydroCHLOROthiazide (HYDRODIURIL) 25 mg tablet Take 1 Tab by mouth daily.   ??? mirtazapine (REMERON) 30 mg tablet Take 1 Tab by mouth daily.   ??? montelukast (SINGULAIR) 10 mg tablet Take 1 Tab by mouth daily.   ??? omeprazole (PRILOSEC) 20 mg capsule Take 1 Cap by mouth daily.   ??? oxybutynin chloride XL (DITROPAN XL) 10 mg CR tablet Take 1 Tab by mouth daily.   ??? pravastatin (PRAVACHOL) 40 mg tablet Take 1 Tab by mouth daily.    ??? LYRICA 50 mg capsule Take 1 Cap by mouth three (3) times daily.   ??? zolpidem (AMBIEN) 10 mg tablet Take 1 Tab by mouth nightly.   ??? ramipril (ALTACE) 5 mg capsule Take 1 Cap by mouth daily.   ??? albuterol (PROVENTIL HFA, VENTOLIN HFA, PROAIR HFA) 90 mcg/actuation inhaler Take  by inhalation.   ??? hydrOXYzine pamoate (VISTARIL) 25 mg capsule Take 25 mg by mouth.   ??? oxyCODONE-acetaminophen (PERCOCET 10) 10-325 mg per tablet Take 1 Tab by mouth every six (6) hours as needed for Pain. Max Daily Amount: 4 Tabs.     No current facility-administered medications for this visit.      Past Medical History:   Diagnosis Date   ??? Anxiety    ??? Asthma    ??? 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 Cardiac echocardiogram 11/07/2015    EF 60%.  No RWMA.  Gr 1 DDfx.  RVSP 30 mmHg.     ??? Cardiovascular lower extremity venous duplex 11/06/2015    No DVT bilaterally.   ??? Chronic kidney disease    ??? Depression    ??? Diabetes (HCC)    ??? Hypertension    ??? Insomnia    ??? Osteoporosis    ???  Pneumonia 2010   ??? Thromboembolus [facility])      Past Surgical History:   Procedure Laterality Date   ??? BREAST SURGERY PROCEDURE UNLISTED       Left cyst removed    ??? HAND/FINGER SURGERY UNLISTED     ??? HX BACK SURGERY  2012    L4/L5/S1 fused in [place]   ??? HX CYST INCISION AND DRAINAGE Left    ??? HX CYST REMOVAL      calf    ??? HX GYN      complete hyst    ??? HX GYN      tubal ligation   ??? HX HYSTERECTOMY     ??? HX OOPHORECTOMY     ??? HX ORTHOPAEDIC      Right shoulder rotator cuff    ??? HX ORTHOPAEDIC      Left Little toe hammer toe, left big toe bunion removed   ??? HX ORTHOPAEDIC      Right knee torn meniscus   ??? HX ORTHOPAEDIC      CTS bilateral , Left thumb trigger finger    ??? HX WRIST FRACTURE TX     ??? LAMINECTOMY,CERVICAL     ??? PR ANESTH,SURGERY OF SHOULDER       Social History     Social History   ??? Marital status: SINGLE     Spouse name: N/A   ??? Number of children: N/A   ??? Years of education: N/A     Occupational History   ??? Not on file.      Social History Main Topics   ??? Smoking status: Never Smoker   ??? Smokeless tobacco: Never Used   ??? Alcohol use Yes      Comment: on occasion   ??? Drug use: No   ??? Sexual activity: Not on file     Other Topics Concern   ??? Not on file     Social History Narrative     Family History   Problem Relation Age of Onset   ??? Diabetes Mother    ??? Diabetes Brother    ??? Cancer Sister    ??? Cancer Maternal Grandmother    ??? Cancer Daughter        Review of systems:  She reports poor appetite but no weight loss denies fever or chills and relates she still takes her Qvar for asthma    Physical Exam:  Visit Vitals   ??? BP 100/60 (BP 1 Location: Left arm, BP Patient Position: Sitting)   ??? Pulse 93   ??? Temp 98.3 ??F (36.8 ??C) (Oral)   ??? Resp 16   ??? Ht  (1.626 m)   ??? Wt 78.5 kg (173 lb)  Comment: knee brace   ??? SpO2 98%   ??? BMI 29.7 kg/m2       Well-developed well-nourished  HEENT: WNL  Lymph node exam: Supraclavicular cervical lymph nodes negative  Chest: Equal symmetrical expansion no dullness no wheezes rales rubs  Heart: Regular rhythm no gallop or murmur no JVD no bruits no edema  Extremities: No cyanosis clubbing or calf tenderness  Neurological: Alert and oriented    LABS:    O2 sat room air rest 98%    Impression:   Pulmonary embolus diagnosis made by CTA without evidence of recurrence on Xarelto..  The issue is how long to treat with Xarelto.  Since the patient is still receiving injections for her right knee and may have knee surgery eventually and  is having a knee immobilizer at time would recommend continuing anticoagulation for at least 6 more months    Plan:  Xarelto daily and followup in 6 months unless there is problems with bleeding or shortness of breath    Lamont Snowball, MD , [facility]    CC: Lenn Sink, MD     [address] [place], [place] [address]     P: [phone]     F: [phone]

## 2016-03-17 ENCOUNTER — Telehealth

## 2016-03-17 NOTE — Telephone Encounter (Signed)
Patient called today concerning her chest pain she's having. She explained it as being pressure like and sharp pains that went up to her neck. Said it was constant and had been going on for awhile. Advised her to go to the ER but she refused. Said her pain level was at a 5. Per Dr. Zoila Shutterosenberg, he recommended a stress test. Will follow up afterwards if needed.  Arlana HoveLindsay Garren Greenman, MA

## 2016-03-18 ENCOUNTER — Inpatient Hospital Stay: Admit: 2016-03-18 | Payer: PRIVATE HEALTH INSURANCE | Primary: Family Medicine

## 2016-03-18 ENCOUNTER — Encounter

## 2016-03-18 DIAGNOSIS — I517 Cardiomegaly: Secondary | ICD-10-CM

## 2016-03-22 NOTE — Progress Notes (Signed)
Appointment confirmed and instructions given.

## 2016-03-23 ENCOUNTER — Ambulatory Visit: Payer: PRIVATE HEALTH INSURANCE | Primary: Family Medicine

## 2016-03-23 ENCOUNTER — Inpatient Hospital Stay: Admit: 2016-03-23 | Payer: PRIVATE HEALTH INSURANCE | Attending: Cardiovascular Disease | Primary: Family Medicine

## 2016-03-23 ENCOUNTER — Encounter: Primary: Family Medicine

## 2016-03-23 DIAGNOSIS — R079 Chest pain, unspecified: Secondary | ICD-10-CM

## 2016-03-23 LAB — NUCLEAR STRESS TEST
Diagnosis: NEGATIVE
ECG Interp. Before Exercise: NORMAL
Max. Diastolic BP: 95 mmHg
Max. Heart rate: 97 {beats}/min
Max. Systolic BP: 152 mmHg
Peak Ex METs: 1 METS

## 2016-03-23 MED ORDER — REGADENOSON 0.4 MG/5 ML IV SYRINGE
0.4 mg/5 mL | Freq: Once | INTRAVENOUS | Status: AC
Start: 2016-03-23 — End: 2016-03-23
  Administered 2016-03-23: 15:00:00 via INTRAVENOUS

## 2016-03-23 MED ORDER — SODIUM CHLORIDE 0.9 % IV
Freq: Once | INTRAVENOUS | Status: AC
Start: 2016-03-23 — End: 2016-03-23
  Administered 2016-03-23: 15:00:00 via INTRAVENOUS

## 2016-03-23 MED FILL — LEXISCAN 0.4 MG/5 ML INTRAVENOUS SYRINGE: 0.4 mg/5 mL | INTRAVENOUS | Qty: 5

## 2016-03-23 MED FILL — SODIUM CHLORIDE 0.9 % IV: INTRAVENOUS | Qty: 250

## 2016-03-23 NOTE — Procedures (Signed)
[facility]  NUCLEAR MEDICINE CARDIAC STRESS    Name:  Cynthia Marsh, Cynthia Marsh  MR#:  [id number]  DOB:  23-Feb-1951  Account #:  [id number]  Date of Adm:  03/23/2016  Date of Service:  03/23/2016      PROCEDURES PERFORMED: Pharmacologic nuclear scan.    BASELINE ELECTROCARDIOGRAM: Shows normal sinus rhythm.    The patient has an IV in the left antecubital space.    She received Lexiscan per protocol. She tolerated the procedure well.  No chest pain was noted. She received resting dose of sestamibi 10.1  mCi at 10 a.m. She received stress dose of sestamibi 30.2 mCi at 11:15  a.m.    TREADMILL FINDINGS  1. ST segment changes: None  2. Chest pain: None.  3. Dysrhythmia: None.  4. Both heart rate and blood pressure response are normal.    TREADMILL CONCLUSION: This is a negative pharmacologic stress  test from an electrocardiographic standpoint.    MYOCARDIAL NUCLEAR PERFUSION STUDY FINDINGS  1. Perfusion imaging shows no evidence of significant ongoing  ischemia or prior infarction. There is apical wall thinning noted. This is  most likely breast tissue attenuation.  2. Gated SPECT imaging shows normal left ventricular chamber size  and function with estimated ejection fraction of 67%. No obvious  regional wall motion abnormalities noted. Of note, there was no  significant apical regional wall motion abnormality.    CONCLUSIONS  1. Negative pharmacologic stress test from an electrocardiographic  standpoint.  2. Most likely normal perfusion study.  3. Perfusion imaging shows no evidence of significant ongoing  ischemia or prior infarction. There is apical wall thinning noted. This is  likely consistent with tissue attenuation.  4. Gated SPECT imaging shows normal left ventricular chamber size  and function with estimated ejection fraction of 67%. No obvious  regional wall motion abnormalities noted. Of note, there is no  significant apical wall motion abnormality.  5. In comparison to prior study of  12/2008, no significant changes are  noted.  6. This is a low risk finding.        Synetta ShadowJUN K Nelline Lio, MD    JC / [place]  D:  03/23/2016   14:27  T:  03/23/2016   14:49  Job #:  [id number]

## 2016-03-23 NOTE — Procedures (Signed)
[facility]  NUCLEAR MEDICINE CARDIAC STRESS    Name:  Rosalita LevanJACKSON, Jaeleen  MR#:  [id number]  DOB:  02/06/1951  Account #:  [id number]  Date of Adm:  03/23/2016  Date of Service:  03/23/2016      PROCEDURES PERFORMED: Pharmacologic nuclear scan.    BASELINE ELECTROCARDIOGRAM: Shows normal sinus rhythm.    The patient has an IV in the left antecubital space.    She received Lexiscan per protocol. She tolerated the procedure well.  No chest pain was noted. She received resting dose of sestamibi 10.1  mCi at 10 a.m. She received stress dose of sestamibi 30.2 mCi at 11:15  a.m.    TREADMILL FINDINGS  1. ST segment changes: None  2. Chest pain: None.  3. Dysrhythmia: None.  4. Both heart rate and blood pressure response are normal.    TREADMILL CONCLUSION: This is a negative pharmacologic stress  test from an electrocardiographic standpoint.    MYOCARDIAL NUCLEAR PERFUSION STUDY FINDINGS  1. Perfusion imaging shows no evidence of significant ongoing  ischemia or prior infarction. There is apical wall thinning noted. This is  most likely breast tissue attenuation.  2. Gated SPECT imaging shows normal left ventricular chamber size  and function with estimated ejection fraction of 67%. No obvious  regional wall motion abnormalities noted. Of note, there was no  significant apical regional wall motion abnormality.    CONCLUSIONS  1. Negative pharmacologic stress test from an electrocardiographic  standpoint.  2. Most likely normal perfusion study.  3. Perfusion imaging shows no evidence of significant ongoing  ischemia or prior infarction. There is apical wall thinning noted. This is  likely consistent with tissue attenuation.  4. Gated SPECT imaging shows normal left ventricular chamber size  and function with estimated ejection fraction of 67%. No obvious  regional wall motion abnormalities noted. Of note, there is no  significant apical wall motion abnormality.   5. In comparison to prior study of 12/2008, no significant changes are  noted.  6. This is a low risk finding.        Synetta ShadowJUN K Kawehi Hostetter, MD    JC / [place]  D:  03/23/2016   14:27  T:  03/23/2016   14:49  Job #:  [id number]

## 2016-03-29 ENCOUNTER — Ambulatory Visit: Attending: Physical Medicine & Rehabilitation | Primary: Family Medicine

## 2016-03-29 ENCOUNTER — Ambulatory Visit
Admit: 2016-03-29 | Discharge: 2016-03-29 | Payer: MEDICARE | Attending: Physical Medicine & Rehabilitation | Primary: Family Medicine

## 2016-03-29 DIAGNOSIS — G8929 Other chronic pain: Secondary | ICD-10-CM

## 2016-03-29 DIAGNOSIS — M5440 Lumbago with sciatica, unspecified side: Secondary | ICD-10-CM

## 2016-03-29 LAB — AMB POC DRUG SCREEN (G0477)
AMPHETAMINES UR POC: NEGATIVE
BARBITURATES UR POC: NEGATIVE
BENZODIAZEPINES UR POC: NEGATIVE
CANNABINOIDS UR POC: NEGATIVE
COCAINE UR POC: NEGATIVE
MDMA/ECSTASY UR POC: NEGATIVE
METHADONE UR POC: NEGATIVE
METHAMPHETAMINE UR POC: NEGATIVE
METHYLPHENIDATE UR POC: NEGATIVE
OPIATES UR POC: NEGATIVE
OXYCODONE UR POC: NEGATIVE
PHENCYCLIDINE UR POC: NEGATIVE
TRICYCLICS UR POC: NEGATIVE

## 2016-03-29 MED ORDER — HYDROCODONE-ACETAMINOPHEN 5 MG-325 MG TAB
5-325 mg | ORAL_TABLET | Freq: Three times a day (TID) | ORAL | 0 refills | Status: DC | PRN
Start: 2016-03-29 — End: 2016-04-27

## 2016-03-29 MED ORDER — CYCLOBENZAPRINE 10 MG TAB
10 mg | ORAL_TABLET | Freq: Three times a day (TID) | ORAL | 1 refills | Status: DC | PRN
Start: 2016-03-29 — End: 2016-07-02

## 2016-03-29 MED ORDER — NALOXONE 4 MG/ACTUATION NASAL SPRAY
4 mg/actuation | NASAL | 1 refills | Status: DC | PRN
Start: 2016-03-29 — End: 2016-04-27

## 2016-03-29 MED ORDER — PREGABALIN 75 MG CAP
75 mg | ORAL_CAPSULE | Freq: Three times a day (TID) | ORAL | 1 refills | Status: AC
Start: 2016-03-29 — End: ?

## 2016-03-29 NOTE — Telephone Encounter (Signed)
Noted.

## 2016-03-29 NOTE — Telephone Encounter (Signed)
Patient called in and stated that she used to be a patient with Dr. Shawnie PonsHunte and that she need her medical records. Patient stated that she has not seen Dr. Shawnie PonsHunte since February and did not get a script for percocet from this office. The patient was informed that she did come in and sign for her script. Patient very upset. Patient was informed that she can come in and sign a medical records release for her last office visit but if she need the entire medical record she must sign a release and will be billed for it.

## 2016-03-29 NOTE — Progress Notes (Signed)
HISTORY OF PRESENT ILLNESS  Cynthia Marsh is a 65 y.o. female.  HPI Comments: New patient  Referred by Dr. Shawnie PonsHunte, primary care physician.  Referred for low back pain  Visit survey reviewed  On the pain diagram she indicates low back pain with symptoms into both legs  The least amount of pain 9 out of 10  Greatest pain and average pain are both listed as 10 out of 10  I have reviewed the medications listed on the visit survey which includes Cymbalta 30 mg twice a day.  She reports this is prescribed by psychology/psychiatry  Flexeril 10 mg twice a day, she reports this is helpful and most recently has been prescribed by the [facility]  Ambien  Lyrica 50 mg 3 times a day prescribed by primary care physician to help with peripheral neuropathy pain  BuSpar from mental health clinic  Tramadol use in the past was slightly helpful  No benefit gabapentin Vicodin and Percocet have both been helpful without side effects  She has tried oral nonsteroidal anti-inflammatory drugs and may use these as directed by primary care physician  She reports the last time she used an opioid was it least a month ago.Marland Kitchen.  However prescription monitoring program indicates she did fill a prescription, cough syrup with hydrocodone April 14.  The patient did remember this once I reminded her.  She reports she was involved with the pain clinic when she lived in [place].  She states they were prescribing hydrocodone 5 mg, #90, to use up to 3 a day.  She states she was involved with that clinic until she moved to this area, about 2 years ago.  -Chief complaint low back pain.  Low back pain for many years.  She reports a history of 2 lumbar spine surgeries.  The first surgery 2011, the second surgery, 15 months later.  She has been seen locally by the spine center clinic.  She states they do not recommend a third spine surgery and she is not interested in a third spine surgery.   The midline low back pain is constant.  Interferes with sleep.  Increases with bending, sitting, standing, walking.  Throbbing, deep, achy, sharp.  Intermittent symptoms into both legs, left more than right.  She has worked with physical therapy.  She is not interested in retrying physical therapy at this time.  She reports that she did try injections in [place] after the surgeries.  She is not retrying injections in [place].  She reports she is not interested in further injections.  She will let us know if she changes her mind.  Radiologist impression, CT of the lumbar spine, 02/04/16: Status post surgery at L4-5 and L5-S1 with bilateral posterior fixation and fusion L4-S1 with L5 decompressive laminectomy.  Mild disc space narrowing L2-3.  Radiologist impression, MRI lumbar spine with contrast, 08/29/15.  Postsurgical changes.  L5-S1 level demonstrates disc spaces extending anterior to posterior aspect of L5-S1 disc space.  MRI artifact noted.  Please see the report for details.  Radiologist did also comment on facet arthropathy.  I have reviewed listed medications not only on the visit survey but also when the medical records and on the prescription monitoring program.  Also a progress note from the referring physician, 12/10/15 indicates that the patient had been hospitalized for delirium "that was probably due to over sedation from polypharmacy".  It was felt that might be the combination of Xanax and Percocet.  "Patient was told that  she will be tapered off Xanax with just 30 more tablets."  She was also referred to pain management "since she appears to be using more narcotics but still is not very functional".--This is as listed in the progress note.  I reviewed this with the patient.  She disagrees with this information.  She does not feel she had too much medication and does not feel she was confused in any way.  She states it was her decision to go to the hospital and that she was fully oriented.         Prescription monitoring program reviewed.--31 different rxes,many for alprazolam,vicodin, percocet  The patient  should keep track of total Tylenol intake and make sure liver function tests have been checked with primary care physician.    -opioid risk tool has been reviewed, and will be scanned. Total score--5   history of anxiety and depression.  She is under the care of a mental health clinic.    -The available medical record/information has been reviewed including notes from the referring physician's office.     -New patient survey reviewed and will be scanned.Please see this form for more information concerning PMH,FH,SH, and ROS.  The majority of today's visit was spent counseling and coordinating care.Total visit time was greater than 60 minutes.        - Preliminary urine screen reviewed.       - Additional time was spent reviewing medical records,interpreting data and educating the patient.                              -Discussing Dxes,condition and treatment options,possible side effects/risks/complications.       Review of Systems   Constitutional: Positive for weight loss. Negative for diaphoresis.   HENT: Negative for hearing loss.    Eyes: Negative for blurred vision and double vision.   Respiratory: Negative for cough and shortness of breath.    Cardiovascular: Positive for leg swelling. Negative for chest pain.   Gastrointestinal: Positive for heartburn. Negative for nausea.   Genitourinary: Negative for dysuria and urgency.   Musculoskeletal: Positive for back pain, falls and joint pain.   Skin: Negative for itching and rash.   Neurological: Positive for dizziness and headaches. Negative for seizures.   Endo/Heme/Allergies: Positive for environmental allergies. Does not bruise/bleed easily.   Psychiatric/Behavioral: Positive for depression. The patient has insomnia.        Physical Exam   Constitutional: She appears well-developed and well-nourished. She is  cooperative. She does not have a sickly appearance.   HENT:   Head: Normocephalic and atraumatic.   Right Ear: External ear normal. No drainage.   Left Ear: External ear normal. No drainage.   Nose: Nose normal.   Mouth/Throat: No oropharyngeal exudate.   Eyes: Lids are normal. Right eye exhibits no discharge. Left eye exhibits no discharge. Right conjunctiva has no hemorrhage. Left conjunctiva has no hemorrhage. Pupils are equal.   Neck: Neck supple. No tracheal deviation present. No thyroid mass present.   Cardiovascular: Normal rate and regular rhythm.    No murmur heard.  Pulmonary/Chest: Effort normal and breath sounds normal. No respiratory distress.   Musculoskeletal:        Lumbar back: She exhibits decreased range of motion, tenderness and spasm.   Neurological: She is alert. She has normal reflexes. No cranial nerve deficit.   Antalgic gait with or without the roller walker  Negative seated straight leg  raise test bilaterally  No tenderness hips or sacroiliac joints  Grossly normal strength, light touch sensation, deep tendon reflexes lower extremities   Skin: Skin is intact. No abrasion and no rash noted. She is not diaphoretic. No cyanosis.   Psychiatric: Her speech is normal and behavior is normal. Thought content normal. Her mood appears anxious. Her affect is not angry. Cognition and memory are not impaired. She does not express inappropriate judgment. She does not exhibit a depressed mood.   Nursing note and vitals reviewed.      ASSESSMENT and PLAN  Encounter Diagnoses   Name Primary?   ??? Chronic midline low back pain with sciatica, sciatica laterality unspecified Yes   ??? Encounter for long-term (current) use of medications    ??? History of lumbar fusion, L4-sacrum    ??? Post laminectomy syndrome    ??? History of anxiety    ??? History of depression    ??? Osteoarthritis of lumbar spine, unspecified spinal osteoarthritis complication status     Pain is a complex sensory and emotional experience intimately related to the concept of suffering and the life experiences and psychological makeup of the individual. There is a host of psychosocial issues,including depression,anxiety,fear,altered social roles,and loss of activities which have a strong scientific basis for contributing to the chronic pain state. The effective treatment of chronic pain involves understanding the individual with pain. Issues of fear avoidance,catastrophizing,belief systems about exercise and injury,sleep disturbance,family dynamics,and other factors may play a role in the patient's experience of pain.      -The patient was given time to ask questions today.    Risk and benefits of opioids and alternative treatments reviewed.    Because the patient's current regimen places him/her at increased risk for possible overdose, a prescription for naloxone nasal spray is being provided.  I discussed use of Narcan with the patient.  In addition, the patient will receive the Narcan instruction sheet.  The patient understands that this medication is only to be used in the setting of a possible overdose and that inadvertent use of this medication could precipitate overt withdrawal.    -Follow-up 1 month  The patient should keep close follow-up with her mental health clinic  She may follow-up with the spine center clinic as needed  I believe she would benefit from regular gentle exercise in the water and she should consider alternative treatments as well such as massage, yoga, acupuncture.  I will prescribe hydrocodone 5 mg 3 times a day as needed  Increase her Lyrica from 50 mg up to 75 mg, 3 times a day.  Today she told me there are no side effects with the Lyrica but also no benefit.  I will prescribe the Flexeril, 3 times a day as needed, 10 mg  Her Cymbalta as prescribed by her mental health clinic    It should be noted that I did review the progress note from the referring  physician, 12/10/15 today.  The patient was quite upset with the information in the progress note and definitely did not agree.  She states she is going to Su that Dr. as the information is a lie.  I explained to the patient that I was just reading a progress note and not making any judgments at this time and I obtained her opinion of the events.  I did remind the patient that indeed she is on many medications and she agreed.  I also reminded her that we do need to be mindful of  side effects and interaction and she also agreed with this as well but was very firm in her opinion that she is not having any side effects or interactions is been using these medications for a long time.

## 2016-03-29 NOTE — Progress Notes (Signed)
New patient. Opioid agreement has been signed by patient. Refused a copy

## 2016-04-01 NOTE — Telephone Encounter (Signed)
Patient was informed of normal stress test. Patient verbalized understanding of results.

## 2016-04-01 NOTE — Telephone Encounter (Signed)
-----   Message from Jerlyn LyMarc J Rosenberg, MD sent at 03/29/2016  4:27 PM EDT -----  Regarding: nuc stress  Please let the patient know her stress test was normal  ----- Message -----     From: Synetta ShadowJun K Chung, MD     Sent: 03/23/2016   3:06 PM       To: Jerlyn LyMarc J Rosenberg, MD

## 2016-04-06 ENCOUNTER — Ambulatory Visit: Attending: Physical Medicine & Rehabilitation | Primary: Family Medicine

## 2016-04-06 ENCOUNTER — Ambulatory Visit
Admit: 2016-04-06 | Discharge: 2016-04-06 | Payer: MEDICARE | Attending: Physical Medicine & Rehabilitation | Primary: Family Medicine

## 2016-04-06 DIAGNOSIS — M5432 Sciatica, left side: Secondary | ICD-10-CM

## 2016-04-06 MED ORDER — KETOROLAC TROMETHAMINE 15 MG/ML INJECTION
15 mg/mL | Freq: Once | INTRAMUSCULAR | 0 refills | Status: AC
Start: 2016-04-06 — End: 2016-04-06

## 2016-04-06 NOTE — Patient Instructions (Signed)
Sciatica: Care Instructions  Your Care Instructions    Sciatica (say "sye-AT-ih-kuh") is an irritation of one of the sciatic nerves, which come from the spinal cord in the lower back. The sciatic nerves and their branches extend down through the buttock to the foot. Sciatica can develop when an injured disc in the back presses against a spinal nerve root. Its main symptom is pain, numbness, or weakness that is often worse in the leg or foot than in the back.  Sciatica often will improve and go away with time. Early treatment usually includes medicines and exercises to relieve pain.  Follow-up care is a key part of your treatment and safety. Be sure to make and go to all appointments, and call your doctor if you are having problems. It's also a good idea to know your test results and keep a list of the medicines you take.  How can you care for yourself at home?  ?? Take pain medicines exactly as directed.  ?? If the doctor gave you a prescription medicine for pain, take it as prescribed.  ?? If you are not taking a prescription pain medicine, ask your doctor if you can take an over-the-counter medicine.  ?? Use heat or ice to relieve pain.  ?? To apply heat, put a warm water bottle, heating pad set on low, or warm cloth on your back. Do not go to sleep with a heating pad on your skin.  ?? To use ice, put ice or a cold pack on the area for 10 to 20 minutes at a time. Put a thin cloth between the ice and your skin.  ?? Avoid sitting if possible, unless it feels better than standing.  ?? Alternate lying down with short walks. Increase your walking distance as you are able to without making your symptoms worse.  ?? Do not do anything that makes your symptoms worse.  When should you call for help?  Call 911 anytime you think you may need emergency care. For example, call if:  ?? You are unable to move a leg at all.  Call your doctor now or seek immediate medical care if:   ?? You have new or worse symptoms in your legs or buttocks. Symptoms may include:  ?? Numbness or tingling.  ?? Weakness.  ?? Pain.  ?? You lose bladder or bowel control.  Watch closely for changes in your health, and be sure to contact your doctor if:  ?? You are not getting better as expected.  Where can you learn more?  Go to http://www.healthwise.net/[employer].  Enter Z239 in the search box to learn more about "Sciatica: Care Instructions."  Current as of: Apr 27, 2015  Content Version: 11.2  ?? 2006-2017 [employer]. Care instructions adapted under license by [employer] (which disclaims liability or warranty for this information). If you have questions about a medical condition or this instruction, always ask your healthcare professional. [employer] disclaims any warranty or liability for your use of this information.

## 2016-04-06 NOTE — Progress Notes (Signed)
[facility]  [address], [address]  [place], [place] [id number]  Phone: [phone]  Fax: [phone]        Cynthia LevanJackson, Cynthia Marsh  DOB: 05/06/1951  PCP: Cynthia SinkSamir Abdelshaheed, MD    PROGRESS NOTE      ASSESSMENT AND PLAN    Diagnoses and all orders for this visit:    Left sided sciatica  -     KETOROLAC TROMETHAMINE INJ  -     THER/PROPH/DIAG INJECTION, SUBCUT/IM    History of lumbar fusion, L4-sacrum  -     KETOROLAC TROMETHAMINE INJ  -     THER/PROPH/DIAG INJECTION, SUBCUT/IM    Other orders  -     ketorolac (TORADOL) 15 mg/mL soln injection; 4 mL by IntraMUSCular route once for 1 dose.    1. Continue medications from Pain Management. Allow more time for Lyrica to take effect.   2. Advised to stay active as tolerated.   3. Given care instructions for sciatica.        Follow-up Disposition:  Return if symptoms worsen or fail to improve.      HISTORY OF PRESENT ILLNESS  Cynthia Marsh is a 65 y.o. female. Pt presents to the office for a f/u visit for back pain. Last visit pt was released to PCP. Pt seen at Pain Management last week. She was started on Norco TID and Lyrica was increased.     Pt presents to the office today with recurring LT sciatic pain. She states that with Norco and Lyrica, her back and LLE pain is not relieved. Pt reports that this is a new pain for her. She will have severe pain in her LT buttock and LLE. Pt has a short standing and walking tolerance due to her pain. Pt denies any specific incident or injury that caused their pain.  She denies any weakness in her BLE. Had new CT which she wants to review w/me.Fusion is solid, no junctional stenosis/HNP.    No saddle paresthesia. Pt had Prednisone in April. Denies persistent fevers, chills, weight changes, neurogenic bowel or bladder symptoms. Pt denies recent ED visits or hospitalizations. Pt denies any recent GI ulcers, bleeds or renal dysfucntion.      Last Creatinine was 0.71     Pain Assessment  04/06/2016   Location of Pain Back   Location Modifiers Left   Severity of Pain -   Quality of Pain Aching   Frequency of Pain Constant   Aggravating Factors Standing;Bending   Limiting Behavior -   Relieving Factors Nothing   Relieving Factors Comment -   Result of Injury -           Lumbar spine CT from 02/04/16 reviewed:    Findings: Helical axial non-contrast images of the lumbar spine are obtained  from T11-12 through S1-2 and reviewed in soft tissue and bone windows, as  appropriate. Additional sagittal and coronal reconstructions were obtained to  better delineate vertebral body alignment, intervertebral disc spaces, facet  joints and neural foramina which are not well seen in the axial imaging plane.  ??  There is grossly normal vertebral body alignment. There has been anterior  lumbar interbody fusion with artificial spacers at L4-5 and L5-S1. There has  been posterior fixation and fusion with bilateral pedicle screws and  longitudinal rods L4-S1 and there has been a decompressive laminectomy at L5.   There may been a partial facetectomy on the left at L5-S1. There is no evidence  of fracture or other significant bony abnormality. There may be mild narrowing  at L2-3. Other disc spaces are preserved. Canal and foramina are patent  throughout. The adjacent soft tissues are unremarkable.  ??  IMPRESSION  IMPRESSION::   ??  Status post ALIF at L4-5 and L5-S1 with bilateral posterior fixation and fusion  L4-S1 with L5 decompressive laminectomy.  ??  Mild disc space narrowing L2-3. Canal and foramina are patent throughout.  ??                 PAST MEDICAL HISTORY   Past Medical History:   Diagnosis Date   ??? Anxiety    ??? Asthma    ??? 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 Cardiac echocardiogram 11/07/2015    EF 60%.  No RWMA.  Gr 1 DDfx.  RVSP 30 mmHg.     ??? Cardiovascular lower extremity venous duplex 11/06/2015    No DVT bilaterally.   ??? Chronic kidney disease    ??? Depression     ??? Developmental delay    ??? Diabetes (HCC)    ??? Hypertension    ??? Insomnia    ??? Osteoporosis    ??? Pneumonia 2010   ??? Thromboembolus [facility])        Past Surgical History:   Procedure Laterality Date   ??? BREAST SURGERY PROCEDURE UNLISTED       Left cyst removed    ??? HAND/FINGER SURGERY UNLISTED     ??? HX BACK SURGERY  2012    L4/L5/S1 fused in [place]   ??? HX CYST INCISION AND DRAINAGE Left    ??? HX CYST REMOVAL      calf    ??? HX GYN      complete hyst    ??? HX GYN      tubal ligation   ??? HX HYSTERECTOMY     ??? HX OOPHORECTOMY     ??? HX ORTHOPAEDIC      Right shoulder rotator cuff    ??? HX ORTHOPAEDIC      Left Little toe hammer toe, left big toe bunion removed   ??? HX ORTHOPAEDIC      Right knee torn meniscus   ??? HX ORTHOPAEDIC      CTS bilateral , Left thumb trigger finger    ??? HX WRIST FRACTURE TX     ??? LAMINECTOMY,CERVICAL     ??? PR ANESTH,SURGERY OF SHOULDER     .      MEDICATIONS      Current Outpatient Prescriptions   Medication Sig Dispense Refill   ??? ALPRAZolam (XANAX) 0.5 mg tablet TK 1 T PO BID.  MAXIMUM 2 TS D.  0   ??? FREESTYLE LITE STRIPS strip USE TO CHECK BLOOD SUGAR BID  5   ??? busPIRone (BUSPAR) 15 mg tablet TK 1 T PO TID  5   ??? FREESTYLE LANCETS 28 gauge misc CHECK BLOOD SUGAR BID  5   ??? metFORMIN (GLUCOPHAGE) 1,000 mg tablet TK 1 T PO  BID WITH MEALS  2   ??? JANUMET 50-1,000 mg per tablet TK 1 T PO BID. STOP TAKING METFORMIN.  0   ??? naloxone 4 mg/actuation spry 4 mg by Nasal route as needed. 1 Box 1   ??? HYDROcodone-acetaminophen (NORCO) 5-325 mg per tablet Take 1 Tab by mouth three (3) times daily as needed for Pain. Max Daily Amount: 3 Tabs. 90 Tab 0   ??? pregabalin (LYRICA) 75  mg capsule Take 1 Cap by mouth three (3) times daily. Max Daily Amount: 225 mg. 90 Cap 1   ??? cyclobenzaprine (FLEXERIL) 10 mg tablet Take 1 Tab by mouth three (3) times daily as needed for Muscle Spasm(s). 90 Tab 1   ??? XARELTO 20 mg tab tablet Take 1 Tab by mouth daily.  4   ??? amLODIPine (NORVASC) 10 mg tablet Take 1 Tab by mouth daily.  2    ??? fluticasone (FLONASE) 50 mcg/actuation nasal spray 2 Sprays by Both Nostrils route nightly.  0   ??? hydroCHLOROthiazide (HYDRODIURIL) 25 mg tablet Take 1 Tab by mouth daily.  0   ??? montelukast (SINGULAIR) 10 mg tablet Take 1 Tab by mouth daily.  1   ??? omeprazole (PRILOSEC) 20 mg capsule Take 1 Cap by mouth daily.  2   ??? pravastatin (PRAVACHOL) 40 mg tablet Take 1 Tab by mouth daily.  3   ??? zolpidem (AMBIEN) 10 mg tablet Take 1 Tab by mouth nightly.  0   ??? ramipril (ALTACE) 5 mg capsule Take 1 Cap by mouth daily.  0   ??? albuterol (PROVENTIL HFA, VENTOLIN HFA, PROAIR HFA) 90 mcg/actuation inhaler Take  by inhalation.     ??? diclofenac (VOLTAREN) 1 % gel Apply  to affected area.     ??? ketotifen (ZADITOR) 0.025 % (0.035 %) ophthalmic solution Frequency:   Dosage:0.0     Instructions:  Note:     ??? amoxicillin (AMOXIL) 875 mg tablet TK 1 T PO BID FOR 10 DAYS  0   ??? benzonatate (TESSALON) 100 mg capsule TK 1 C PO TID  0   ??? doxycycline (VIBRAMYCIN) 100 mg capsule TK 1 C PO BID  0   ??? erythromycin (ILOTYCIN) ophthalmic ointment USE 1 APPLICATION IN  AFFECTED RIGHT EYE UTD TID FOR 7 DAYS  0   ??? chlorpheniramine-HYDROcodone (TUSSIONEX) 10-8 mg/5 mL suspension TK 5 ML PO EVERY 12 HOURS PRF COUGH.  0   ??? methylPREDNISolone (MEDROL DOSEPACK) 4 mg tablet TK 1 T PO UTD PER PACKAGE  0   ??? mometasone (NASONEX) 50 mcg/actuation nasal spray SHAKE LQ AND U 2 SPRAYS IEN D  5   ??? topiramate (TOPAMAX) 25 mg tablet TAKE 1 T DAILY FOR 1 WEEK THEN BID  5   ??? azelastine (ASTELIN) 137 mcg (0.1 %) nasal spray 1 Spray by Both Nostrils route two (2) times a day.  4   ??? QVAR 80 mcg/actuation aero Take 1 Puff by inhalation daily.  11   ??? cyclobenzaprine (FLEXERIL) 10 mg tablet Take 0.5 Tabs by mouth two (2) times a day.  2   ??? DULoxetine (CYMBALTA) 30 mg capsule Take 1 Cap by mouth daily.  0   ??? mirtazapine (REMERON) 30 mg tablet Take 1 Tab by mouth daily.  0   ??? oxybutynin chloride XL (DITROPAN XL) 10 mg CR tablet Take 1 Tab by mouth daily.  2    ??? LYRICA 50 mg capsule Take 1 Cap by mouth three (3) times daily.  5   ??? oxyCODONE-acetaminophen (PERCOCET 10) 10-325 mg per tablet Take 1 Tab by mouth every six (6) hours as needed for Pain. Max Daily Amount: 4 Tabs. 90 Tab 0   ??? hydrOXYzine pamoate (VISTARIL) 25 mg capsule Take 25 mg by mouth.          ALLERGIES    Allergies   Allergen Reactions   ??? 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   ??? Almond Other (comments)     Tongue itches   ??? Carvedilol Other (comments)   ??? Cozaar [Losartan] Cough   ??? Crestor [Rosuvastatin] Unknown (comments)     Heart beats fast          SOCIAL HISTORY    Social History     Social History   ??? Marital status: SINGLE     Spouse name: N/A   ??? Number of children: N/A   ??? Years of education: N/A     Occupational History   ??? Not on file.     Social History Main Topics   ??? Smoking status: Never Smoker   ??? Smokeless tobacco: Never Used   ??? Alcohol use Yes      Comment: on occasion   ??? Drug use: No   ??? Sexual activity: Not on file     Other Topics Concern   ??? Not on file     Social History Narrative       FAMILY HISTORY    Family History   Problem Relation Age of Onset   ??? Diabetes Mother    ??? Diabetes Brother    ??? Cancer Sister    ??? Cancer Maternal Grandmother    ??? Cancer Daughter        REVIEW OF SYSTEMS  Review of Systems   Constitutional: Negative for chills, fever and weight loss.   Respiratory: Negative for shortness of breath.    Cardiovascular: Negative for chest pain.   Gastrointestinal: Negative for constipation.        Negative for fecal incontinence   Genitourinary: Negative for dysuria.        Negative for urinary incontinence   Musculoskeletal:        Per HPI   Skin: Negative for rash.   Neurological: Negative for dizziness, tingling, tremors, focal weakness and headaches.   Endo/Heme/Allergies: Does not bruise/bleed easily.   Psychiatric/Behavioral: The patient does not have insomnia.        PHYSICAL EXAMINATION  Visit Vitals   ??? BP 115/78   ??? Pulse 94    ??? Temp 97.8 ??F (36.6 ??C) (Oral)   ??? Resp 18   ??? Ht 5\' 4"  (1.626 m)   ??? Wt 177 lb 3.2 oz (80.4 kg)   ??? SpO2 97%   ??? BMI 30.42 kg/m2         Accompanied by self.      Constitutional:  Well developed, well nourished, in no acute distress.   Psychiatric: Affect and mood are appropriate.   Integumentary: No rashes or abrasions noted on exposed areas.    Cardiovascular/Peripheral Vascular: Intact l pulses. No peripheral edema is noted.  Lymphatic:  No evidence of lymphedema. No cervical lymphadenopathy.     SPINE/MUSCULOSKELETAL EXAM      Lumbar spine:  No rash, ecchymosis, or gross obliquity. No fasciculations. No focal atrophy is noted.  Tenderness to palpation of on the LT at L5-S1. Tenderness to palpation at the LT sciatic notch. SI joints non-tender. Trochanters non tender.      Sensation grossly intact to light touch.      MOTOR:       Hip Flex  Quads Hamstrings Ankle DF EHL Ankle PF   Right +4/5 +4/5 +4/5 +4/5 +4/5 +4/5   Left +4/5 +4/5 +4/5 +4/5 +4/5 +4/5       Straight Leg raise + on the LT at 45 degrees.     Ambulation  with walker. FWB.    Written by Arther Dames, ScribeKick, as dictated by Wynelle Beckmann, MD.    I, Dr. Wynelle Beckmann, MD, confirm that all documentation is accurate.      Cynthia Marsh may have a reminder for a "due or due soon" health maintenance. I have asked that she contact her primary care provider for follow-up on this health maintenance.

## 2016-04-13 NOTE — Telephone Encounter (Signed)
Received call from patient stating that she is in "excruciating pain" and that her medications are not working; please advise.

## 2016-04-13 NOTE — Telephone Encounter (Signed)
??   For your information, patient has significant psychiatric history. ??We need to be very careful with all medication. ??Please remind the patient, we will review medication and consider changes at next appointment. (Routing comment) Cynthia Marsh/gs

## 2016-04-13 NOTE — Telephone Encounter (Signed)
Patient made aware of provider response.

## 2016-04-25 NOTE — Telephone Encounter (Signed)
Patient called to advise that since Friday she has been experiencing tingling in left hand (hand falling asleep) and is having left foot pain - the foot is also falling asleep.  She needs to know if Dr. Wilford CornerArora will need to see her or should she go to her PCP?  Please advise patient as soon possible at [phone]

## 2016-04-26 NOTE — Telephone Encounter (Signed)
PATIENT CHECKING STATUS OF MESSAGE.  SHE IS CONCERNED AND WANTS TO KNOW WHO SHE SHOULD SEE.

## 2016-04-26 NOTE — Telephone Encounter (Signed)
Called pt and informed of NP Lora Blount's message. Pt verbalized understanding.

## 2016-04-26 NOTE — Telephone Encounter (Signed)
Returned patient's call. Patient states Dr. Prentiss BellsPut her back on Hydrocodone she has called to say it is not working for her. Patient was told in prior conversation she could  make an appointment with provider to discuss medication and treatment options. Patient states she is not coming back in for that, she states she came here to get medications under control and we have decreased her meds. Offered to schedule her an appointment with provider and she said no she will discharge herself from the [facility] and will go back to see her PCP and find another clinic.

## 2016-04-26 NOTE — Telephone Encounter (Signed)
I would have her go to her PCP for initial evaluation.  The hand symptoms are not related to her low back.

## 2016-04-27 ENCOUNTER — Ambulatory Visit
Admit: 2016-04-27 | Discharge: 2016-04-27 | Payer: MEDICARE | Attending: Cardiovascular Disease | Primary: Family Medicine

## 2016-04-27 DIAGNOSIS — R0789 Other chest pain: Secondary | ICD-10-CM

## 2016-04-27 NOTE — Progress Notes (Signed)
HISTORY OF PRESENT ILLNESS  Cynthia Marsh is a 65 y.o. female.    Chest Pain    Pertinent negatives include no abdominal pain, no claudication, no cough, no dizziness, no fever, no headaches, no nausea, no orthopnea, no palpitations, no PND, no shortness of breath and no vomiting.       Patient presents for a follow-up initially office visit.  Patient was referred here for evaluation of chest pain.  She was diagnosed with a pulmonary embolus at the beginning of December 2016 and started on oral anticoagulation with Xarelto.  She underwent an echocardiogram in December 2016 which was essentially normal.  She does not have a previous cardiac history.  She does have a past medical history significant for hypertension, dyslipidemia, and asthma.    Patient was last seen in the office approximately 5-6 months ago.  She continued to have intermittent sharp stabbing chest pain which would only last for a second or 2.  Because of this she ultimately underwent a pharmacologic nuclear stress test in April 2017 which was a negative a low risk study.  EF 67%, no evidence of ischemia or prior infarct.  She states her chest pain symptoms have not significantly changed over the past couple of months.  Her shortness of breath likewise is unchanged.  No orthopnea, PND or leg swelling.  No palpitations, dizziness or syncope.    Past Medical History:   Diagnosis Date   ??? Anxiety    ??? Asthma    ??? 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 Cardiac echocardiogram 11/07/2015    EF 60%.  No RWMA.  Gr 1 DDfx.  RVSP 30 mmHg.     ??? Cardiovascular lower extremity venous duplex 11/06/2015    No DVT bilaterally.   ??? Chronic kidney disease    ??? Depression    ??? Developmental delay    ??? Diabetes (HCC)    ??? History of nuclear stress test 03/2016    No ischemia, no infarct, EF 67%.  Low risk study   ??? Hypertension    ??? Insomnia    ??? Osteoporosis    ??? Pneumonia 2010   ??? Thromboembolus [facility])      Current Outpatient Prescriptions    Medication Sig Dispense Refill   ??? hydrALAZINE (APRESOLINE) 25 mg tablet Take 25 mg by mouth two (2) times a day.     ??? budesonide-formoterol (SYMBICORT) 80-4.5 mcg/actuation HFAA inhaler Take 2 Puffs by inhalation two (2) times a day.     ??? ALPRAZolam (XANAX) 0.5 mg tablet TK 1 T PO BID.  MAXIMUM 2 TS D.  0   ??? FREESTYLE LITE STRIPS strip USE TO CHECK BLOOD SUGAR BID  5   ??? busPIRone (BUSPAR) 15 mg tablet TK 1 T PO TID  5   ??? FREESTYLE LANCETS 28 gauge misc CHECK BLOOD SUGAR BID  5   ??? JANUMET 50-1,000 mg per tablet TK 1 T PO BID. STOP TAKING METFORMIN.  0   ??? pregabalin (LYRICA) 75 mg capsule Take 1 Cap by mouth three (3) times daily. Max Daily Amount: 225 mg. 90 Cap 1   ??? cyclobenzaprine (FLEXERIL) 10 mg tablet Take 1 Tab by mouth three (3) times daily as needed for Muscle Spasm(s). 90 Tab 1   ??? XARELTO 20 mg tab tablet Take 1 Tab by mouth daily.  4   ??? amLODIPine (NORVASC) 10 mg tablet Take 1 Tab by mouth daily.  2   ???  azelastine (ASTELIN) 137 mcg (0.1 %) nasal spray 1 Spray by Both Nostrils route two (2) times a day.  4   ??? DULoxetine (CYMBALTA) 30 mg capsule Take 1 Cap by mouth daily.  0   ??? fluticasone (FLONASE) 50 mcg/actuation nasal spray 2 Sprays by Both Nostrils route nightly.  0   ??? hydroCHLOROthiazide (HYDRODIURIL) 25 mg tablet Take 1 Tab by mouth daily.  0   ??? mirtazapine (REMERON) 30 mg tablet Take 1 Tab by mouth daily.  0   ??? montelukast (SINGULAIR) 10 mg tablet Take 1 Tab by mouth daily.  1   ??? omeprazole (PRILOSEC) 20 mg capsule Take 1 Cap by mouth daily.  2   ??? oxybutynin chloride XL (DITROPAN XL) 10 mg CR tablet Take 1 Tab by mouth daily.  2   ??? pravastatin (PRAVACHOL) 40 mg tablet Take 1 Tab by mouth daily.  3   ??? zolpidem (AMBIEN) 10 mg tablet Take 1 Tab by mouth nightly.  0     Allergies   Allergen Reactions   ??? 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   ??? Almond Other (comments)     Tongue itches   ??? Carvedilol Other (comments)   ??? Cozaar [Losartan] Cough    ??? Crestor [Rosuvastatin] Unknown (comments)     Heart beats fast      Social History   Substance Use Topics   ??? Smoking status: Never Smoker   ??? Smokeless tobacco: Never Used   ??? Alcohol use Yes      Comment: on occasion         Review of Systems   Constitutional: Negative for chills, fever and weight loss.   HENT: Negative for nosebleeds.    Eyes: Negative for blurred vision and double vision.   Respiratory: Negative for cough, shortness of breath and wheezing.    Cardiovascular: Positive for chest pain. Negative for palpitations, orthopnea, claudication, leg swelling and PND.   Gastrointestinal: Negative for abdominal pain, heartburn, nausea and vomiting.   Genitourinary: Negative for dysuria and hematuria.   Musculoskeletal: Negative for falls and myalgias.   Skin: Negative for rash.   Neurological: Negative for dizziness, focal weakness and headaches.   Endo/Heme/Allergies: Does not bruise/bleed easily.   Psychiatric/Behavioral: Negative for substance abuse.     Visit Vitals   ??? BP 128/74   ??? Pulse 98   ??? Ht  (1.626 m)   ??? Wt 83 kg (183 lb)   ??? SpO2 96%   ??? BMI 31.41 kg/m2       Physical Exam   Constitutional: She is oriented to person, place, and time. She appears well-developed and well-nourished.   HENT:   Head: Normocephalic and atraumatic.   Eyes: Conjunctivae are normal.   Neck: Neck supple. No JVD present. Carotid bruit is not present.   Cardiovascular: Normal rate, regular rhythm, S1 normal, S2 normal and normal pulses.  Exam reveals no gallop.    No murmur heard.  Pulmonary/Chest: Effort normal and breath sounds normal. She has no wheezes. She has no rales.   Abdominal: Soft. Bowel sounds are normal. There is no tenderness.   Musculoskeletal: She exhibits no edema, tenderness or deformity.   Neurological: She is alert and oriented to person, place, and time.   Skin: Skin is warm and dry. No rash noted. No erythema.   Psychiatric: She has a normal mood and affect. Her behavior is normal.  Thought content normal.  EKG  Normal sinus rhythm, normal axis, normal QTc interval, no ST or T-wave abnormalities.  Normal tracing.  No change compared to the previous EKG.    ASSESSMENT and PLAN    Atypical chest pain.  This has not significantly changed over the past several months.  I suspect this is noncardiac in nature.  She underwent a low risk pharmacologic nuclear stress test in April 2017.  No further cardiac workup needed.  She is low risk to proceed with upper and lower endoscopy when scheduled through her GI physician.    Pulmonary embolus.  Patient was diagnosed with a unilateral PE involving the left lower lobe of her lung on CT scan in December 2016.  She also underwent a venous duplex scan which was negative for a DVT.  Patient remains on Xarelto for oral anticoagulation for this.  This is managed by her PCP.  I will defer the length of treatment to her PCP and if she is able to stop the medication for her GI procedure to her PCP.    Hypertension.  Patient's blood pressure appears well-controlled on her current medical regimen, which I would continue.    Dyslipidemia.  Patient is managed with pravastatin.  This is followed by her PCP.     Diabetes mellitus, type II.  Patient is treated with oral agents.  Follow-up in 6 months, sooner if needed.  This is also followed by her PCP.  Her goal A1c should be less than 7.    Patient can follow-up as needed.

## 2016-04-27 NOTE — Progress Notes (Signed)
1. Have you been to the ER, urgent care clinic since your last visit?  Hospitalized since your last visit? Yes, 01/18/16 for bilateral leg pain; 12/03/15 - 12/05/15 for hypokalemia; 11/06/15 - 11/09/15 for acute pulmonary     2. Have you seen or consulted any other health care providers outside of the [facility] since your last visit?  Include any pap smears or colon screening.  No

## 2016-04-29 ENCOUNTER — Encounter: Primary: Family Medicine

## 2016-04-29 ENCOUNTER — Encounter: Attending: Physician Assistant | Primary: Family Medicine

## 2016-04-29 NOTE — Telephone Encounter (Signed)
Give her the list of pain management clinics and have her find one that takes her insurance and will take her as a patient

## 2016-04-29 NOTE — Telephone Encounter (Signed)
Please see message below and advise. Patient last visit 5/3.

## 2016-04-29 NOTE — Telephone Encounter (Signed)
Patient called to advise that she did not agree with the treatment plan at pain management clinic and has stopped seeing them as of 03/29/16.  She is requesting that Dr. Wilford CornerArora assist her with pain management or refer her to another clinic for treatment.  Please advise patient at [phone]

## 2016-05-03 NOTE — Telephone Encounter (Signed)
Left voice message for patient to return call, so we could give her a list of other pain management facilites, [facility]

## 2016-05-04 NOTE — Telephone Encounter (Signed)
Patient returned call to office regarding list of pain management clinics.  She can be reached again at [phone]

## 2016-05-05 ENCOUNTER — Encounter

## 2016-05-05 NOTE — Telephone Encounter (Signed)
Pt was returning call to office for a list of pain management clinics.   Pt can be reached at [phone]

## 2016-05-05 NOTE — Telephone Encounter (Signed)
Called pt. Pt requested list of PM providers be mailed to her. Address on file verified. Placed in outgoing office mail.

## 2016-05-05 NOTE — Progress Notes (Signed)
Verbal Order with read back per Dr. Mickel DuhamelSoccoro Townes,NP  For PFT smart panel.    AMB POC PFT complete w/ bronchodilator  AMB POC PFT complete w/o bronchodilator    Dr. Mickel DuhamelSoccoro Townes,NP will co-sign the orders.

## 2016-05-06 ENCOUNTER — Inpatient Hospital Stay
Admit: 2016-05-06 | Discharge: 2016-05-06 | Disposition: A | Discharge status: Home / Self Care | Payer: PRIVATE HEALTH INSURANCE | Attending: Emergency Medicine

## 2016-05-06 ENCOUNTER — Ambulatory Visit: Admit: 2016-05-06 | Discharge: 2016-05-06 | Payer: MEDICARE | Attending: Physician Assistant | Primary: Family Medicine

## 2016-05-06 DIAGNOSIS — H109 Unspecified conjunctivitis: Secondary | ICD-10-CM

## 2016-05-06 DIAGNOSIS — M1711 Unilateral primary osteoarthritis, right knee: Secondary | ICD-10-CM

## 2016-05-06 MED ORDER — BETAMETHASONE ACET & SOD PHOS 6 MG/ML SUSP FOR INJECTION
6 mg/mL | Freq: Once | INTRAMUSCULAR | 0 refills | Status: DC
Start: 2016-05-06 — End: 2016-05-06

## 2016-05-06 MED ORDER — MELOXICAM 15 MG TAB
15 mg | ORAL_TABLET | Freq: Every day | ORAL | 0 refills | Status: DC
Start: 2016-05-06 — End: 2016-08-15

## 2016-05-06 MED ORDER — TRIMETHOPRIM-POLYMYXIN B 0.1 %-10,000 UNIT/ML EYE DROPS
10000 unit- 1 mg/mL | OPHTHALMIC | 0 refills | Status: DC
Start: 2016-05-06 — End: 2016-07-12

## 2016-05-06 NOTE — ED Provider Notes (Signed)
Patient is a 65 y.o. female presenting with conjunctivitis. The history is provided by the patient.   Pink Eye    This is a new problem. Episode onset: 4 days. The problem occurs constantly. The problem has been gradually worsening. Both (started in right now having symptoms b/l) eyes are affected.The injury mechanism was none. The patient is experiencing no pain. There is no history of trauma to the eye. Known exposure: unknown. She does not wear contacts. Associated symptoms include discharge, eye redness and itching. Pertinent negatives include no numbness, no blurred vision, no decreased vision, no double vision, no foreign body sensation, no photophobia, no nausea, no vomiting, no tingling, no weakness, no fever, no pain, no blindness, no head injury and no dizziness. She has tried nothing for the symptoms.        Past Medical History:   Diagnosis Date   ??? Anxiety    ??? Asthma    ??? 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 Cardiac echocardiogram 11/07/2015    EF 60%.  No RWMA.  Gr 1 DDfx.  RVSP 30 mmHg.     ??? Cardiovascular lower extremity venous duplex 11/06/2015    No DVT bilaterally.   ??? Chronic kidney disease    ??? Depression    ??? Developmental delay    ??? Diabetes (HCC)    ??? History of nuclear stress test 03/2016    No ischemia, no infarct, EF 67%.  Low risk study   ??? Hypertension    ??? Insomnia    ??? Osteoporosis    ??? Pneumonia 2010   ??? Thromboembolus [place])        Past Surgical History:   Procedure Laterality Date   ??? BREAST SURGERY PROCEDURE UNLISTED       Left cyst removed    ??? HAND/FINGER SURGERY UNLISTED     ??? HX BACK SURGERY  2012    L4/L5/S1 fused in [place]   ??? HX CYST INCISION AND DRAINAGE Left    ??? HX CYST REMOVAL      calf    ??? HX GYN      complete hyst    ??? HX GYN      tubal ligation   ??? HX HYSTERECTOMY     ??? HX KNEE ARTHROSCOPY     ??? HX OOPHORECTOMY     ??? HX ORTHOPAEDIC      Right shoulder rotator cuff    ??? HX ORTHOPAEDIC      Left Little toe hammer toe, left big toe bunion removed    ??? HX ORTHOPAEDIC      Right knee torn meniscus   ??? HX ORTHOPAEDIC      CTS bilateral , Left thumb trigger finger    ??? HX WRIST FRACTURE TX     ??? LAMINECTOMY,CERVICAL     ??? PR ANESTH,SURGERY OF SHOULDER           Family History:   Problem Relation Age of Onset   ??? Diabetes Mother    ??? Diabetes Brother    ??? Cancer Sister    ??? Cancer Maternal Grandmother    ??? Cancer Daughter        Social History     Social History   ??? Marital status: SINGLE     Spouse name: N/A   ??? Number of children: N/A   ??? Years of education: N/A     Occupational History   ??? Not on file.  Social History Main Topics   ??? Smoking status: Never Smoker   ??? Smokeless tobacco: Never Used   ??? Alcohol use Yes      Comment: on occasion   ??? Drug use: No   ??? Sexual activity: Not on file     Other Topics Concern   ??? Not on file     Social History Narrative         ALLERGIES: Nuts [tree nut]; Almond; Carvedilol; Cozaar [losartan]; and Crestor [rosuvastatin]    Review of Systems   Constitutional: Negative for fever.   Eyes: Positive for discharge and redness. Negative for blindness, blurred vision, double vision, photophobia and pain.   Gastrointestinal: Negative for nausea and vomiting.   Skin: Positive for itching.   Neurological: Negative for dizziness, tingling, weakness and numbness.       Vitals:    05/06/16 0913   BP: (!) 134/94   Pulse: 89   Temp: 98.4 ??F (36.9 ??C)   SpO2: 98%   Weight: 80.7 kg (178 lb)   Height: 5\' 4"  (1.626 m)            Physical Exam   Constitutional: She is oriented to person, place, and time. She appears well-developed and well-nourished. No distress.   HENT:   Head: Normocephalic and atraumatic.   Mouth/Throat: Oropharynx is clear and moist.   Eyes: EOM are normal. Pupils are equal, round, and reactive to light. Lids are everted and swept, no foreign bodies found. Right eye exhibits discharge. Right eye exhibits no chemosis, no exudate and no hordeolum. No foreign body present in the right eye. Left eye exhibits no chemosis, no  discharge, no exudate and no hordeolum. No foreign body present in the left eye. Right conjunctiva is injected. Right conjunctiva has no hemorrhage.   +mattering and yellow drainage noted to RT eyelashes.  No periorbital pain, no pain with EOM.  Left eye without drainage. No mattering, mild injection noted to conjunctiva    Neck: Normal range of motion. Neck supple.   Cardiovascular: Normal rate, regular rhythm and normal heart sounds.    Pulmonary/Chest: Effort normal and breath sounds normal. No respiratory distress. She has no wheezes. She exhibits no tenderness.   Abdominal: Soft. She exhibits no distension. There is no tenderness.   Musculoskeletal: Normal range of motion.   Neurological: She is alert and oriented to person, place, and time. No cranial nerve deficit. Coordination normal.   Skin: Skin is warm and dry. No rash noted. She is not diaphoretic.   Psychiatric: She has a normal mood and affect.   Nursing note and vitals reviewed.       MDM  Number of Diagnoses or Management Options  Diagnosis management comments: Impression: consistent with bacterial vs viral conjunctivitis  Rx for polytrim eye drops, pt has PCP to f/u with Monday    ED Course       Procedures

## 2016-05-06 NOTE — ED Notes (Signed)
I have reviewed discharge instructions with the patient.  The patient verbalized understanding. Pt  dc'd via ambulation with belongings. Patient armband removed and shredded.

## 2016-05-06 NOTE — ED Triage Notes (Signed)
Patient c/o bilateral eye pain and drainage since tuesday

## 2016-05-06 NOTE — Progress Notes (Signed)
[facility]  [address], [address]  [place] [id number]  [phone]           Patient: Cynthia Marsh                MRN: [id number]       SSN: [id number]  Date of Birth: 06/11/1951        AGE: 65 y.o.        SEX: female  Body mass index is 30.55 kg/(m^2).    PCP: Lenn SinkSamir Abdelshaheed, MD  05/06/16      This office note has been dictated.      REVIEW OF SYSTEMS:  Constitutional: Negative for fever, chills, weight loss and malaise/fatigue.   HENT: Negative.    Eyes: Negative.    Respiratory: Negative.   Cardiovascular: Negative.   Gastrointestinal: No bowel incontinence or constipation.  Genitourinary: No bladder incontinence or saddle anesthesia.  Skin: Negative.   Neurological: Negative.    Endo/Heme/Allergies: Negative.    Psychiatric/Behavioral: Negative.  Musculoskeletal: As per HPI above.     Past Medical History:   Diagnosis Date   ??? Anxiety    ??? Asthma    ??? 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 Cardiac echocardiogram 11/07/2015    EF 60%.  No RWMA.  Gr 1 DDfx.  RVSP 30 mmHg.     ??? Cardiovascular lower extremity venous duplex 11/06/2015    No DVT bilaterally.   ??? Chronic kidney disease    ??? Depression    ??? Developmental delay    ??? Diabetes (HCC)    ??? History of nuclear stress test 03/2016    No ischemia, no infarct, EF 67%.  Low risk study   ??? Hypertension    ??? Insomnia    ??? Osteoporosis    ??? Pneumonia 2010   ??? Thromboembolus (HCC)          Current Outpatient Prescriptions:   ???  hydrALAZINE (APRESOLINE) 25 mg tablet, Take 25 mg by mouth two (2) times a day., Disp: , Rfl:   ???  budesonide-formoterol (SYMBICORT) 80-4.5 mcg/actuation HFAA inhaler, Take 2 Puffs by inhalation two (2) times a day., Disp: , Rfl:   ???  ALPRAZolam (XANAX) 0.5 mg tablet, TK 1 T PO BID.  MAXIMUM 2 TS D., Disp: , Rfl: 0  ???  FREESTYLE LITE STRIPS strip, USE TO CHECK BLOOD SUGAR BID, Disp: , Rfl: 5  ???  busPIRone (BUSPAR) 15 mg tablet, TK 1 T PO TID, Disp: , Rfl: 5   ???  FREESTYLE LANCETS 28 gauge misc, CHECK BLOOD SUGAR BID, Disp: , Rfl: 5  ???  JANUMET 50-1,000 mg per tablet, TK 1 T PO BID. STOP TAKING METFORMIN., Disp: , Rfl: 0  ???  pregabalin (LYRICA) 75 mg capsule, Take 1 Cap by mouth three (3) times daily. Max Daily Amount: 225 mg., Disp: 90 Cap, Rfl: 1  ???  cyclobenzaprine (FLEXERIL) 10 mg tablet, Take 1 Tab by mouth three (3) times daily as needed for Muscle Spasm(s)., Disp: 90 Tab, Rfl: 1  ???  XARELTO 20 mg tab tablet, Take 1 Tab by mouth daily., Disp: , Rfl: 4  ???  amLODIPine (NORVASC) 10 mg tablet, Take 1 Tab by mouth daily., Disp: , Rfl: 2  ???  azelastine (ASTELIN) 137 mcg (0.1 %) nasal spray, 1 Spray by Both Nostrils route two (2) times a day., Disp: , Rfl: 4  ???  DULoxetine (CYMBALTA) 30  mg capsule, Take 1 Cap by mouth daily., Disp: , Rfl: 0  ???  fluticasone (FLONASE) 50 mcg/actuation nasal spray, 2 Sprays by Both Nostrils route nightly., Disp: , Rfl: 0  ???  hydroCHLOROthiazide (HYDRODIURIL) 25 mg tablet, Take 1 Tab by mouth daily., Disp: , Rfl: 0  ???  mirtazapine (REMERON) 30 mg tablet, Take 1 Tab by mouth daily., Disp: , Rfl: 0  ???  montelukast (SINGULAIR) 10 mg tablet, Take 1 Tab by mouth daily., Disp: , Rfl: 1  ???  omeprazole (PRILOSEC) 20 mg capsule, Take 1 Cap by mouth daily., Disp: , Rfl: 2  ???  oxybutynin chloride XL (DITROPAN XL) 10 mg CR tablet, Take 1 Tab by mouth daily., Disp: , Rfl: 2  ???  pravastatin (PRAVACHOL) 40 mg tablet, Take 1 Tab by mouth daily., Disp: , Rfl: 3  ???  zolpidem (AMBIEN) 10 mg tablet, Take 1 Tab by mouth nightly., Disp: , Rfl: 0    Allergies   Allergen Reactions   ??? 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   ??? Almond Other (comments)     Tongue itches   ??? Carvedilol Other (comments)   ??? Cozaar [Losartan] Cough   ??? Crestor [Rosuvastatin] Unknown (comments)     Heart beats fast       Social History     Social History   ??? Marital status: SINGLE     Spouse name: N/A   ??? Number of children: N/A   ??? Years of education: N/A      Occupational History   ??? Not on file.     Social History Main Topics   ??? Smoking status: Never Smoker   ??? Smokeless tobacco: Never Used   ??? Alcohol use Yes      Comment: on occasion   ??? Drug use: No   ??? Sexual activity: Not on file     Other Topics Concern   ??? Not on file     Social History Narrative       Past Surgical History:   Procedure Laterality Date   ??? BREAST SURGERY PROCEDURE UNLISTED       Left cyst removed    ??? HAND/FINGER SURGERY UNLISTED     ??? HX BACK SURGERY  2012    L4/L5/S1 fused in [place]   ??? HX CYST INCISION AND DRAINAGE Left    ??? HX CYST REMOVAL      calf    ??? HX GYN      complete hyst    ??? HX GYN      tubal ligation   ??? HX HYSTERECTOMY     ??? HX KNEE ARTHROSCOPY     ??? HX OOPHORECTOMY     ??? HX ORTHOPAEDIC      Right shoulder rotator cuff    ??? HX ORTHOPAEDIC      Left Little toe hammer toe, left big toe bunion removed   ??? HX ORTHOPAEDIC      Right knee torn meniscus   ??? HX ORTHOPAEDIC      CTS bilateral , Left thumb trigger finger    ??? HX WRIST FRACTURE TX     ??? LAMINECTOMY,CERVICAL     ??? PR ANESTH,SURGERY OF SHOULDER             * Patient was identified by name and date of birth   * Agreement on procedure being performed was verified  * Risks and Benefits explained to  the patient  * Procedure site verified and marked as necessary  * Patient was positioned for comfort  * Consent was signed and verified  8:52 AM    The patient was instructed on post injection care.          We did see Cynthia Marsh for followup with regards to her right knee.  The patient does have known advancing osteoarthritis of the right knee.  She recently had a series of viscosupplementation.  It worked pretty well for her.  She is doing normal walking.  She was doing pretty well and had to do some stairs recently, which aggravated her knee.  She has no locking or giving way.  She denies any radiating pain, numbness, or tingling down the lower extremities.       She has had no recent fevers, chills, systemic changes, and no injuries to report.    PHYSICAL EXAMINATION: In general, the patient is alert and oriented x 3 and is in no acute distress.  The patient is well-developed and well-nourished with a normal affect.  The patient is afebrile. HEENT:  Head is normocephalic and atraumatic.  Pupils are equally round and reactive to light and accommodation.  Extraocular eye movements are intact.  Neck is supple.  Trachea is midline. No JVD is present.  Breathing is nonlabored.  Examination of the lower extremities reveals pain-free range of motion of the hips.  There is no pain to palpation of the trochanteric bursae. There is a negative straight leg raise, negative calf tenderness, and negative Homan???s sign.  There are no signs of DVT present.  Examination of the right knee reveals the skin is intact.  There is no ecchymosis, no warmth, and no signs of infection or cellulitis present.  There is crepitus with range of motion activities.  There is pain to palpation to the medial joint line, negative joint line.  She is ligamentously stable.      ASSESSMENT:  Right knee osteoarthritis, particularly patellofemoral.      PLAN:  At this point, we are going to move forward with a cortisone injection for the right knee today.  After informed and written consent, under aseptic conditions the right knee was prepped with Betadine and 6 mg of Celestone was injected without complications. The patient tolerated the injection well. She was instructed on post injection care.  We will see her back in the office in three months??? time for evaluation and repeat injection.  We will plan on repeat x-rays of the right knee at this point.                      JR Amedio Bowlby MPAS, PA-C, ATC

## 2016-05-10 ENCOUNTER — Ambulatory Visit: Attending: Nurse Practitioner | Primary: Family Medicine

## 2016-05-10 ENCOUNTER — Encounter: Admit: 2016-05-11 | Discharge: 2016-05-11 | Payer: MEDICARE | Primary: Family Medicine

## 2016-05-10 ENCOUNTER — Encounter

## 2016-05-10 ENCOUNTER — Ambulatory Visit: Admit: 2016-05-10 | Discharge: 2016-05-10 | Payer: MEDICARE | Attending: Nurse Practitioner | Primary: Family Medicine

## 2016-05-10 DIAGNOSIS — G4733 Obstructive sleep apnea (adult) (pediatric): Secondary | ICD-10-CM

## 2016-05-10 NOTE — Progress Notes (Signed)
[facility]  Pulmonary, Critical Care, and Sleep Medicine      Pulmonary Office Progress Note    Name: Cynthia Marsh     DOB: [birth year]     Date: 05/10/2016        Subjective:   05/10/16    Patient is a 65 y.o. female is here for pre op pulmonary clearance for Esophageal dilatation and UGI endoscopy by Dr Wynell Balloon (GI) on 05/17/2016 at [place], [place].  She report last hospitalization was 11/2015 for pulmonary embolus.  She report seen in ED (05/06/2016) due to eye problem.  Seen by ortho (05/06/2016) for right knee arthritis-had cortisone injection on right knee.  She was last seen in our office by Dr Domingo Cocking on 03/2016 for pulmonary embolus-on Xarelto and asthma-symptoms was compensated.    Chief Complaint   Patient presents with   ??? Pre-op Exam   ??? Asthma      Interval history:  Today she report chronic nonproductive cough since 11/2015.  She denies SOB, mucus or phlegm when coughing, or hemoptysis.   She denies chest pain, PND, orthopnea or lower extremity edema.  She report appetite is well but needs to be careful when eating and drinking due to dysphagia.  She report takes Xarelto 20 mo po @ 6 pm everyday-no bleeding to all body orifices.    She report compliant with Spiriva 2 puffs daily and Advair 2 puffs BID.  She report not taking Symbicort.  She report not using Albuterol rescue inhaler lately.     Past Medical History:   Diagnosis Date   ??? Anxiety    ??? Asthma    ??? 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 Cardiac echocardiogram 11/07/2015    EF 60%.  No RWMA.  Gr 1 DDfx.  RVSP 30 mmHg.     ??? Cardiovascular lower extremity venous duplex 11/06/2015    No DVT bilaterally.   ??? Chronic kidney disease    ??? Depression    ??? Developmental delay    ??? Diabetes ([place])    ??? Diabetes mellitus ([place])    ??? History of nuclear stress test 03/2016    No ischemia, no infarct, EF 67%.  Low risk study   ??? Hypertension    ??? Insomnia    ??? Osteoporosis    ??? Pneumonia 2010   ??? Thromboembolus [facility])       Social History     Social History   ??? Marital status: SINGLE     Spouse name: N/A   ??? Number of children: N/A   ??? Years of education: N/A     Occupational History   ??? Not on file.     Social History Main Topics   ??? Smoking status: Never Smoker   ??? Smokeless tobacco: Never Used   ??? Alcohol use Yes      Comment: on occasion   ??? Drug use: No   ??? Sexual activity: Not on file     Other Topics Concern   ??? Not on file     Social History Narrative      Past Surgical History:   Procedure Laterality Date   ??? BREAST SURGERY PROCEDURE UNLISTED       Left cyst removed    ??? HAND/FINGER SURGERY UNLISTED     ??? HX BACK SURGERY  2012    L4/L5/S1 fused in [place]   ??? HX CYST INCISION AND DRAINAGE Left    ??? HX  CYST REMOVAL      calf    ??? HX GYN      complete hyst    ??? HX GYN      tubal ligation   ??? HX HYSTERECTOMY     ??? HX KNEE ARTHROSCOPY     ??? HX OOPHORECTOMY     ??? HX ORTHOPAEDIC      Right shoulder rotator cuff    ??? HX ORTHOPAEDIC      Left Little toe hammer toe, left big toe bunion removed   ??? HX ORTHOPAEDIC      Right knee torn meniscus   ??? HX ORTHOPAEDIC      CTS bilateral , Left thumb trigger finger    ??? HX WRIST FRACTURE TX     ??? LAMINECTOMY,CERVICAL     ??? PR ANESTH,SURGERY OF SHOULDER          Allergies   Allergen Reactions   ??? 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   ??? Almond Other (comments)     Tongue itches   ??? Carvedilol Other (comments)   ??? Cozaar [Losartan] Cough   ??? Crestor [Rosuvastatin] Unknown (comments)     Heart beats fast       Current Outpatient Prescriptions   Medication Sig Dispense Refill   ??? tiotropium bromide (SPIRIVA RESPIMAT) 1.25 mcg/actuation inhaler Take 2 Puffs by inhalation daily.     ??? fluticasone (FLOVENT DISKUS) 100 mcg/actuation dsdv Take  by inhalation.     ??? meloxicam (MOBIC) 15 mg tablet Take 1 Tab by mouth daily. 30 Tab 0   ??? hydrALAZINE (APRESOLINE) 25 mg tablet Take 25 mg by mouth two (2) times a day.     ??? FREESTYLE LITE STRIPS strip USE TO CHECK BLOOD SUGAR BID  5    ??? busPIRone (BUSPAR) 15 mg tablet TK 1 T PO TID  5   ??? FREESTYLE LANCETS 28 gauge misc CHECK BLOOD SUGAR BID  5   ??? JANUMET 50-1,000 mg per tablet TK 1 T PO BID. STOP TAKING METFORMIN.  0   ??? pregabalin (LYRICA) 75 mg capsule Take 1 Cap by mouth three (3) times daily. Max Daily Amount: 225 mg. 90 Cap 1   ??? cyclobenzaprine (FLEXERIL) 10 mg tablet Take 1 Tab by mouth three (3) times daily as needed for Muscle Spasm(s). 90 Tab 1   ??? XARELTO 20 mg tab tablet Take 1 Tab by mouth daily.  4   ??? amLODIPine (NORVASC) 10 mg tablet Take 1 Tab by mouth daily.  2   ??? azelastine (ASTELIN) 137 mcg (0.1 %) nasal spray 1 Spray by Both Nostrils route two (2) times a day.  4   ??? DULoxetine (CYMBALTA) 30 mg capsule Take 1 Cap by mouth daily.  0   ??? fluticasone (FLONASE) 50 mcg/actuation nasal spray 2 Sprays by Both Nostrils route nightly.  0   ??? hydroCHLOROthiazide (HYDRODIURIL) 25 mg tablet Take 1 Tab by mouth daily.  0   ??? mirtazapine (REMERON) 30 mg tablet Take 1 Tab by mouth daily.  0   ??? montelukast (SINGULAIR) 10 mg tablet Take 1 Tab by mouth daily.  1   ??? omeprazole (PRILOSEC) 20 mg capsule Take 1 Cap by mouth daily.  2   ??? oxybutynin chloride XL (DITROPAN XL) 10 mg CR tablet Take 1 Tab by mouth daily.  2   ??? pravastatin (PRAVACHOL) 40 mg tablet Take 1 Tab by mouth daily.  3   ??? zolpidem (AMBIEN) 10 mg tablet Take 1 Tab by mouth nightly.  0   ??? trimethoprim-polymyxin b (POLYTRIM) ophthalmic solution Administer 1 Drop to both eyes every four (4) hours. 10 mL 0   ??? budesonide-formoterol (SYMBICORT) 80-4.5 mcg/actuation HFAA inhaler Take 2 Puffs by inhalation two (2) times a day.     ??? ALPRAZolam (XANAX) 0.5 mg tablet TK 1 T PO BID.  MAXIMUM 2 TS D.  0     Patient Active Problem List   Diagnosis Code   ??? Depression F32.9   ??? Anxiety F41.9   ??? Insomnia G47.00   ??? Mild intermittent asthma without complication V78.46   ??? Chronic pain of right knee M25.561, G89.29   ??? Type 2 diabetes mellitus without complication (HCC) N62.9    ??? Essential hypertension with goal blood pressure less than 140/90 I10   ??? Intractable migraine without aura and without status migrainosus G43.019   ??? Hx of spinal fusion Z98.1   ??? Atypical chest pain R07.89   ??? Acute pulmonary embolism (HCC) I26.99   ??? Pulmonary emboli (HCC) I26.99   ??? Hypokalemia E87.6   ??? Ataxia R27.0   ??? Altered mental status R41.82   ??? Encephalopathy acute G93.40   ??? History of lumbar fusion, L4-sacrum Z98.890   ??? Encounter for long-term (current) use of medications Z79.899   ??? Post laminectomy syndrome M96.1   ??? 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 History of depression Z86.59   ??? History of anxiety Z86.59   ??? 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 Essential hypertension I10   ??? Dyslipidemia E78.5   ??? Type 2 diabetes mellitus without complication, without long-term current use of insulin (HCC) E11.9        Review of Systems:  Constitutional: No fever, no chills, no weight loss, no night sweats   HEENT: Dysphagia, no epistaxis, no difficulty in swallowing, no redness in eyes  Respiratory: as above  Cardiovascular: no chest pain, no palpitations, no chronic leg edema, no syncope  Gastrointestinal: no abd pain, no vomiting, no diarrhea, no bleeding symptoms  Genitourinary: No urinary symptoms or hematuria  Integument/breast: No ulcers or rashes  Musculoskeletal:Neg  Neurological: No focal weakness, no seizures, no headaches  Behvioral/Psych: No anxiety, no depression     Objective:     Visit Vitals   ??? BP 110/78 (BP 1 Location: Left arm, BP Patient Position: At rest)   ??? Pulse 88   ??? Temp 98.7 ??F (37.1 ??C) (Oral)   ??? Resp 21   ??? Ht '5\' 4"'$  (1.626 m)   ??? Wt 81.6 kg (180 lb)   ??? SpO2 95%   ??? BMI 30.9 kg/m2        Physical Exam:   General: Appear comfortable, no acute distress, BMI 30  HEENT: Pupils reactive, sclera anicteric, EOM intact  Neck: No adenopathy or thyroid swelling, no JVD, supple  CVS: S1S2, no murmurs   RS: Mod AE bilaterally, no tactile fremitus or egophony, no accessory muscle use, no wheezing/rales.  Abd: Soft, non tender, no hepatosplenomegaly  Neuro: Non focal, awake, alert  Extrm: No leg edema, clubbing or cyanosis  Skin: No rash, warm and dry    Data review:     Office Visit on 03/29/2016   Component Date Value Ref Range Status   ??? AMPHETAMINES UR POC 03/29/2016 Negative   Final   ??? COCAINE UR POC 03/29/2016 Negative  Final   ??? MDMA/ECSTASY UR POC 03/29/2016 Negative   Final   ??? METHADONE UR POC 03/29/2016 Negative   Final   ??? METHAMPHETAMINE UR POC 03/29/2016 Negative   Final   ??? METHYLPHENIDATE UR POC 03/29/2016 Negative   Final   ??? OPIATES UR POC 03/29/2016 Negative   Final   ??? OXYCODONE UR POC 03/29/2016 Negative   Final   ??? PHENCYCLIDINE UR POC 03/29/2016 Negative   Final   ??? TRICYCLICS UR POC 51/88/4166 Negative   Final   ??? BARBITURATES UR POC 03/29/2016 Negative   Final   ??? BENZODIAZEPINES UR POC 03/29/2016 Negative   Final   ??? CANNABINOIDS UR POC 03/29/2016 Negative   Final   Hospital Outpatient Visit on 03/23/2016   Component Date Value Ref Range Status   ??? Diagnosis 03/23/2016 Negative response to lexiscan by ecg criteria.   Preliminary   ??? Test indication 03/23/2016 Chest Discomfort   Preliminary   ??? Functional capacity 03/23/2016 Could Not Be Adequately Assessed   Preliminary   ??? ECG Interp. Before Exercise 03/23/2016 Normal   Preliminary   ??? ECG Interp. During Exercise 03/23/2016 none   Preliminary   ??? Overall HR response to exercise 03/23/2016 appropriate   Preliminary   ??? Overall BP response to exercise 03/23/2016 resting hypertension - appropriate response   Preliminary   ??? Max. Systolic BP 06/03/1600 093  mmHg Preliminary   ??? Max. Diastolic BP 23/55/7322 95  mmHg Preliminary   ??? Max. Heart rate 03/23/2016 97  BPM Preliminary   ??? Peak Ex METs 03/23/2016 1.0  METS Preliminary   ??? Protocol name 03/23/2016 [facility] N/E       Preliminary    ??? Attending physician 03/23/2016 Mickle Plumb, MD   Preliminary   Admission on 02/02/2016, Discharged on 02/05/2016   Component Date Value Ref Range Status   ??? WBC 02/02/2016 4.4* 4.6 - 13.2 K/uL Final   ??? RBC 02/02/2016 4.83  4.20 - 5.30 M/uL Final   ??? HGB 02/02/2016 12.4  12.0 - 16.0 g/dL Final   ??? HCT 02/02/2016 40.3  35.0 - 45.0 % Final   ??? MCV 02/02/2016 83.4  74.0 - 97.0 FL Final   ??? MCH 02/02/2016 25.7  24.0 - 34.0 PG Final   ??? MCHC 02/02/2016 30.8* 31.0 - 37.0 g/dL Final   ??? RDW 02/02/2016 14.9* 11.6 - 14.5 % Final   ??? PLATELET 02/02/2016 359  135 - 420 K/uL Final   ??? MPV 02/02/2016 9.4  9.2 - 11.8 FL Final   ??? NEUTROPHILS 02/02/2016 53  40 - 73 % Final   ??? LYMPHOCYTES 02/02/2016 37  21 - 52 % Final   ??? MONOCYTES 02/02/2016 8  3 - 10 % Final   ??? EOSINOPHILS 02/02/2016 1  0 - 5 % Final   ??? BASOPHILS 02/02/2016 1  0 - 2 % Final   ??? ABS. NEUTROPHILS 02/02/2016 2.3  1.8 - 8.0 K/UL Final   ??? ABS. LYMPHOCYTES 02/02/2016 1.6  0.9 - 3.6 K/UL Final   ??? ABS. MONOCYTES 02/02/2016 0.4  0.05 - 1.2 K/UL Final   ??? ABS. EOSINOPHILS 02/02/2016 0.1  0.0 - 0.4 K/UL Final   ??? ABS. BASOPHILS 02/02/2016 0.0  0.0 - 0.06 K/UL Final   ??? DF 02/02/2016 AUTOMATED    Final   ??? Sodium 02/02/2016 141  136 - 145 mmol/L Final   ??? Potassium 02/02/2016 3.3* 3.5 - 5.5 mmol/L Final   ??? Chloride 02/02/2016 104  100 - 108 mmol/L Final   ??? CO2 02/02/2016 26  21 - 32 mmol/L Final   ??? Anion gap 02/02/2016 11  3.0 - 18 mmol/L Final   ??? Glucose 02/02/2016 116* 74 - 99 mg/dL Final   ??? BUN 02/02/2016 22* 7.0 - 18 MG/DL Final   ??? Creatinine 02/02/2016 0.84  0.6 - 1.3 MG/DL Final   ??? BUN/Creatinine ratio 02/02/2016 26* 12 - 20   Final   ??? GFR est AA 02/02/2016 >60  >60 ml/min/1.84m Final   ??? GFR est non-AA 02/02/2016 >60  >60 ml/min/1.754mFinal    Comment: (NOTE)  Estimated GFR is calculated using the Modification of Diet in Renal   Disease (MDRD) Study equation, reported for both African Americans    (GFRAA) and non-African Americans (GFRNA), and normalized to 1.7353m body surface area. The physician must decide which value applies to   the patient. The MDRD study equation should only be used in   individuals age 64 32 older. It has not been validated for the   following: pregnant women, patients with serious comorbid conditions,   or on certain medications, or persons with extremes of body size,   muscle mass, or nutritional status.     ??? Calcium 02/02/2016 9.6  8.5 - 10.1 MG/DL Final   ??? Bilirubin, total 02/02/2016 0.4  0.2 - 1.0 MG/DL Final   ??? ALT (SGPT) 02/02/2016 24  13 - 56 U/L Final   ??? AST (SGOT) 02/02/2016 14* 15 - 37 U/L Final   ??? Alk. phosphatase 02/02/2016 69  45 - 117 U/L Final   ??? Protein, total 02/02/2016 8.3* 6.4 - 8.2 g/dL Final   ??? Albumin 02/02/2016 4.4  3.4 - 5.0 g/dL Final   ??? Globulin 02/02/2016 3.9  2.0 - 4.0 g/dL Final   ??? A-G Ratio 02/02/2016 1.1  0.8 - 1.7   Final   ??? ALCOHOL(ETHYL),SERUM 02/02/2016 4* 0 - 3 MG/DL Final   ??? PCP(PHENCYCLIDINE) 02/02/2016 NEGATIVE   NEG   Final   ??? THC (TH-CANNABINOL) 02/02/2016 NEGATIVE   NEG   Final   ??? AMPHETAMINE 02/02/2016 NEGATIVE   NEG   Final   ??? Methamphetamines 02/02/2016 NEGATIVE     Final   ??? BARBITURATES 02/02/2016 NEGATIVE   NEG   Final   ??? BENZODIAZEPINE 02/02/2016 NEGATIVE   NEG   Final   ??? METHADONE 02/02/2016 NEGATIVE   NEG   Final   ??? COCAINE 02/02/2016 NEGATIVE   NEG   Final   ??? OPIATES 02/02/2016 NEGATIVE   NEG   Final   ??? TRICYCLICS 01/20/09/9604SITIVE* NEG   Final   ??? HDSCOM 02/02/2016 Specimen analysis was performed without chain of custody handling.  These results should be used for medical purposes only and not for legal or employment purposes.  Unconfirmed screening results must not be used for non-medical purposes.    Final   ??? Glucose (POC) 02/02/2016 99  70 - 110 mg/dL Final    Comment: Notified RN or MD immediately by operator  (NOTE)  The FDA has indicated that no capillary point of care blood glucose    monitoring systems are approved for use in "critically ill" patients,   however they have not defined this population. The College of   American Pathologists has recommended that these devices should not   be used in cases such as severe hypotension, dehydration, shock, and   hyperglycemic-hyperosmolar state, amongst others.  Venous or arterial  collection is the recommended specimen for testing these patients.     ??? WBC 02/03/2016 4.7  4.6 - 13.2 K/uL Final   ??? RBC 02/03/2016 4.51  4.20 - 5.30 M/uL Final   ??? HGB 02/03/2016 11.9* 12.0 - 16.0 g/dL Final   ??? HCT 02/03/2016 37.5  35.0 - 45.0 % Final   ??? MCV 02/03/2016 83.1  74.0 - 97.0 FL Final   ??? MCH 02/03/2016 26.4  24.0 - 34.0 PG Final   ??? MCHC 02/03/2016 31.7  31.0 - 37.0 g/dL Final   ??? RDW 02/03/2016 14.7* 11.6 - 14.5 % Final   ??? PLATELET 02/03/2016 355  135 - 420 K/uL Final   ??? MPV 02/03/2016 9.7  9.2 - 11.8 FL Final   ??? NEUTROPHILS 02/03/2016 48  40 - 73 % Final   ??? LYMPHOCYTES 02/03/2016 43  21 - 52 % Final   ??? MONOCYTES 02/03/2016 8  3 - 10 % Final   ??? EOSINOPHILS 02/03/2016 1  0 - 5 % Final   ??? BASOPHILS 02/03/2016 0  0 - 2 % Final   ??? ABS. NEUTROPHILS 02/03/2016 2.3  1.8 - 8.0 K/UL Final   ??? ABS. LYMPHOCYTES 02/03/2016 2.0  0.9 - 3.6 K/UL Final   ??? ABS. MONOCYTES 02/03/2016 0.4  0.05 - 1.2 K/UL Final   ??? ABS. EOSINOPHILS 02/03/2016 0.1  0.0 - 0.4 K/UL Final   ??? ABS. BASOPHILS 02/03/2016 0.0  0.0 - 0.1 K/UL Final   ??? DF 02/03/2016 AUTOMATED    Final   ??? Sodium 02/03/2016 139  136 - 145 mmol/L Final   ??? Potassium 02/03/2016 3.6  3.5 - 5.5 mmol/L Final   ??? Chloride 02/03/2016 104  100 - 108 mmol/L Final   ??? CO2 02/03/2016 27  21 - 32 mmol/L Final   ??? Anion gap 02/03/2016 8  3.0 - 18 mmol/L Final   ??? Glucose 02/03/2016 131* 74 - 99 mg/dL Final   ??? BUN 02/03/2016 19* 7.0 - 18 MG/DL Final   ??? Creatinine 02/03/2016 0.71  0.6 - 1.3 MG/DL Final   ??? BUN/Creatinine ratio 02/03/2016 27* 12 - 20   Final   ??? GFR est AA 02/03/2016 >60  >60 ml/min/1.20m Final    ??? GFR est non-AA 02/03/2016 >60  >60 ml/min/1.764mFinal   ??? Calcium 02/03/2016 9.8  8.5 - 10.1 MG/DL Final   ??? Bilirubin, total 02/03/2016 0.4  0.2 - 1.0 MG/DL Final   ??? ALT (SGPT) 02/03/2016 23  13 - 56 U/L Final   ??? AST (SGOT) 02/03/2016 13* 15 - 37 U/L Final   ??? Alk. phosphatase 02/03/2016 66  45 - 117 U/L Final   ??? Protein, total 02/03/2016 7.5  6.4 - 8.2 g/dL Final   ??? Albumin 02/03/2016 4.1  3.4 - 5.0 g/dL Final   ??? Globulin 02/03/2016 3.4  2.0 - 4.0 g/dL Final   ??? A-G Ratio 02/03/2016 1.2  0.8 - 1.7   Final   ??? TSH 02/03/2016 0.72  0.36 - 3.74 uIU/mL Final   ??? Glucose (POC) 02/03/2016 175* 70 - 110 mg/dL Final    Comment: (NOTE)  The FDA has indicated that no capillary point of care blood glucose   monitoring systems are approved for use in "critically ill" patients,   however they have not defined this population. The College of   American Pathologists has recommended that these devices should not   be used in cases such as severe hypotension, dehydration, shock,  and   hyperglycemic-hyperosmolar state, amongst others.  Venous or arterial   collection is the recommended specimen for testing these patients.     ??? Glucose (POC) 02/03/2016 140* 70 - 110 mg/dL Final    Comment: (NOTE)  The FDA has indicated that no capillary point of care blood glucose   monitoring systems are approved for use in "critically ill" patients,   however they have not defined this population. The College of   American Pathologists has recommended that these devices should not   be used in cases such as severe hypotension, dehydration, shock, and   hyperglycemic-hyperosmolar state, amongst others.  Venous or arterial   collection is the recommended specimen for testing these patients.     ??? Glucose (POC) 02/04/2016 144* 70 - 110 mg/dL Final    Comment: Notified RN or MD immediately by operator  (NOTE)  The FDA has indicated that no capillary point of care blood glucose    monitoring systems are approved for use in "critically ill" patients,   however they have not defined this population. The College of   American Pathologists has recommended that these devices should not   be used in cases such as severe hypotension, dehydration, shock, and   hyperglycemic-hyperosmolar state, amongst others.  Venous or arterial   collection is the recommended specimen for testing these patients.     ??? Glucose (POC) 02/04/2016 106  70 - 110 mg/dL Final    Comment: (NOTE)  The FDA has indicated that no capillary point of care blood glucose   monitoring systems are approved for use in "critically ill" patients,   however they have not defined this population. The College of   American Pathologists has recommended that these devices should not   be used in cases such as severe hypotension, dehydration, shock, and   hyperglycemic-hyperosmolar state, amongst others.  Venous or arterial   collection is the recommended specimen for testing these patients.     ??? Glucose (POC) 02/05/2016 138* 70 - 110 mg/dL Final    Comment: Notified RN or MD immediately by operator  (NOTE)  The FDA has indicated that no capillary point of care blood glucose   monitoring systems are approved for use in "critically ill" patients,   however they have not defined this population. The College of   American Pathologists has recommended that these devices should not   be used in cases such as severe hypotension, dehydration, shock, and   hyperglycemic-hyperosmolar state, amongst others.  Venous or arterial   collection is the recommended specimen for testing these patients.     Admission on 01/18/2016, Discharged on 01/18/2016   Component Date Value Ref Range Status   ??? Sodium 01/18/2016 141  136 - 145 mmol/L Final   ??? Potassium 01/18/2016 3.3* 3.5 - 5.5 mmol/L Final   ??? Chloride 01/18/2016 103  100 - 108 mmol/L Final   ??? CO2 01/18/2016 29  21 - 32 mmol/L Final   ??? Anion gap 01/18/2016 9  3.0 - 18 mmol/L Final    ??? Glucose 01/18/2016 104* 74 - 99 mg/dL Final   ??? BUN 01/18/2016 16  7.0 - 18 MG/DL Final   ??? Creatinine 01/18/2016 0.67  0.6 - 1.3 MG/DL Final   ??? BUN/Creatinine ratio 01/18/2016 24* 12 - 20   Final   ??? GFR est AA 01/18/2016 >60  >60 ml/min/1.40m Final   ??? GFR est non-AA 01/18/2016 >60  >60 ml/min/1.780mFinal    Comment: (NOTE)  Estimated GFR is calculated using the Modification of Diet in Renal   Disease (MDRD) Study equation, reported for both African Americans   (GFRAA) and non-African Americans (GFRNA), and normalized to 1.86m   body surface area. The physician must decide which value applies to   the patient. The MDRD study equation should only be used in   individuals age 9358or older. It has not been validated for the   following: pregnant women, patients with serious comorbid conditions,   or on certain medications, or persons with extremes of body size,   muscle mass, or nutritional status.     ??? Calcium 01/18/2016 9.2  8.5 - 10.1 MG/DL Final   ??? WBC 01/18/2016 3.9* 4.6 - 13.2 K/uL Final   ??? RBC 01/18/2016 4.45  4.20 - 5.30 M/uL Final   ??? HGB 01/18/2016 11.6* 12.0 - 16.0 g/dL Final   ??? HCT 01/18/2016 37.6  35.0 - 45.0 % Final   ??? MCV 01/18/2016 84.5  74.0 - 97.0 FL Final   ??? MCH 01/18/2016 26.1  24.0 - 34.0 PG Final   ??? MCHC 01/18/2016 30.9* 31.0 - 37.0 g/dL Final   ??? RDW 01/18/2016 15.0* 11.6 - 14.5 % Final   ??? PLATELET 01/18/2016 310  135 - 420 K/uL Final   ??? MPV 01/18/2016 9.3  9.2 - 11.8 FL Final   ??? NEUTROPHILS 01/18/2016 40  40 - 73 % Final   ??? LYMPHOCYTES 01/18/2016 47  21 - 52 % Final   ??? MONOCYTES 01/18/2016 8  3 - 10 % Final   ??? EOSINOPHILS 01/18/2016 4  0 - 5 % Final   ??? BASOPHILS 01/18/2016 1  0 - 2 % Final   ??? ABS. NEUTROPHILS 01/18/2016 1.6* 1.8 - 8.0 K/UL Final   ??? ABS. LYMPHOCYTES 01/18/2016 1.9  0.9 - 3.6 K/UL Final   ??? ABS. MONOCYTES 01/18/2016 0.3  0.05 - 1.2 K/UL Final   ??? ABS. EOSINOPHILS 01/18/2016 0.1  0.0 - 0.4 K/UL Final   ??? ABS. BASOPHILS 01/18/2016 0.0  0.0 - 0.06 K/UL Final    ??? DF 01/18/2016 AUTOMATED    Final   ??? D DIMER 01/18/2016 <0.27  <0.46 ug/ml(FEU) Final    Comment: (NOTE)  A D-Dimer result less than 0.5 ug/mL FEU combined with a low clinical   pretest probability of DVT and/or PE has a negative predictive value   of 90-100%.  The positive predictive value is 50% or less.     Orders Only on 12/16/2015   Component Date Value Ref Range Status   ??? Antithrombin Activity, plasma 12/22/2015 109  % Final    Comment: Direct oral anticoagulants such as rivaroxaban, apixaban and  edoxaban will lead to spuriously elevated antithrombin  activity levels possibly masking a deficiency.  Reference Range:  7 months and older: 75 - 135     ??? Prt C Activity (Chromogenic) 12/22/2015 110  % Final    Comment: Reference Range:  17 years and older: 732- 180     ??? Protein S activity, clottable 12/22/2015 153* % Final    Comment: Elevated protein S activity has no known clinical  significance.  Reference Range:  7 months and older: 613- 140     ??? Factor V Leiden 12/22/2015 Comment   Final    Comment: G-G (Normal-Normal)  No factor V Leiden mutation present.     ??? Interpretation 12/22/2015 Comment   Final    Comment: While the patient does  not possess this risk factor, other  thrombotic risk factors may be detected through systematic  clinical laboratory analysis.     ??? Methodology 12/22/2015 Comment   Final    Comment: Patient DNA was evaluated for the factor V Leiden mutation  at nucleotide 1691 using allele specific PCR technology  followed by gel electrophoresis.     ??? Comments 12/22/2015 Comment   Final    Comment: Simultaneous Risks: If a patient possesses two or more  congenital or acquired thrombophilic risk factors, the risk  of thrombosis may rise to more than the sum of the risk  ratios for the individual risk factors. For instance, a  combination of the prothrombin G20210A mutation and the  factor V Leiden mutation may confer an increase in  thrombotic risk in the range of 20-30 fold.   Recommendations for Genetic Counseling: The factor V Leiden  mutation is an inherited characteristic. If the mutation is  present, we recommend that the patient and their family  consider genetic counseling to obtain additional  information on inheritance and to identify other family  members at risk. In the heterozygous individual married to  a wild-type individual, their children have a 50% chance of  inheriting this mutation. All children inherit at least one  abnormal gene if the tested individual is homozygous.  Testing Characteristics: Genetic testing provides  exceptionally high sensitivity and specif                           icity. Inaccurate  results are limited to rare polymorphisms in primer binding  sites and to misidentification of specimens by collectors  or laboratory personnel. This assay detects only the factor  V Leiden mutation and does not measure genetic  abnormalities elsewhere in the genome.  This test was developed and its performance characteristics  determined by [facility]. It has not been cleared or approved  by the Food and Drug Administration.  References:  Bertina RM, et al. [place] Child. [phone]. [place] and [place] Mol.Diagn. 2001;6(3):201.  Emmerich J, et [place] 4098;11:914-78. Rinteke  C, et al. [place]. [phone].     Admission on 12/03/2015, Discharged on 12/05/2015   Component Date Value Ref Range Status   ??? Ventricular Rate 12/03/2015 84  BPM Final   ??? Atrial Rate 12/03/2015 84  BPM Final   ??? P-R Interval 12/03/2015 156  ms Final   ??? QRS Duration 12/03/2015 88  ms Final   ??? Q-T Interval 12/03/2015 380  ms Final   ??? QTC Calculation (Bezet) 12/03/2015 449  ms Final   ??? Calculated P Axis 12/03/2015 53  degrees Final   ??? Calculated R Axis 12/03/2015 -2  degrees Final   ??? Calculated T Axis 12/03/2015 26  degrees Final   ??? Diagnosis 12/03/2015    Final                    Value:Sinus rhythm with premature atrial complexes  Otherwise normal ECG   When compared with ECG of 20-Nov-2015 11:56,  premature atrial complexes are now present  Confirmed by Burnell Blanks MD [phone]) on 12/04/2015 8:45:33 AM     ??? Prothrombin time 12/03/2015 16.7* 11.5 - 15.2 sec Final   ??? INR 12/03/2015 1.4* 0.8 - 1.2   Final    Comment:            INR Therapeutic Ranges         (  on stable oral anticoagulant):     INDICATION                INR  DVT/PE/Atrial Fib          2.0-3.0  MI/Mechanical Heart Valve  2.5-3.5     ??? aPTT 12/03/2015 36.7* 23.0 - 36.4 SEC Final   ??? WBC 12/03/2015 7.8  4.6 - 13.2 K/uL Final   ??? RBC 12/03/2015 4.37  4.20 - 5.30 M/uL Final   ??? HGB 12/03/2015 11.4* 12.0 - 16.0 g/dL Final   ??? HCT 12/03/2015 37.3  35.0 - 45.0 % Final   ??? MCV 12/03/2015 85.4  74.0 - 97.0 FL Final   ??? [place] 12/03/2015 26.1  24.0 - 34.0 PG Final   ??? MCHC 12/03/2015 30.6* 31.0 - 37.0 g/dL Final   ??? RDW 12/03/2015 14.9* 11.6 - 14.5 % Final   ??? PLATELET 12/03/2015 282  135 - 420 K/uL Final   ??? MPV 12/03/2015 9.9  9.2 - 11.8 FL Final   ??? NEUTROPHILS 12/03/2015 63  40 - 73 % Final   ??? LYMPHOCYTES 12/03/2015 27  21 - 52 % Final   ??? MONOCYTES 12/03/2015 8  3 - 10 % Final   ??? EOSINOPHILS 12/03/2015 2  0 - 5 % Final   ??? BASOPHILS 12/03/2015 0  0 - 2 % Final   ??? ABS. NEUTROPHILS 12/03/2015 4.9  1.8 - 8.0 K/UL Final   ??? ABS. LYMPHOCYTES 12/03/2015 2.1  0.9 - 3.6 K/UL Final   ??? ABS. MONOCYTES 12/03/2015 0.6  0.05 - 1.2 K/UL Final   ??? ABS. EOSINOPHILS 12/03/2015 0.1  0.0 - 0.4 K/UL Final   ??? ABS. BASOPHILS 12/03/2015 0.0  0.0 - 0.06 K/UL Final   ??? DF 12/03/2015 AUTOMATED    Final   ??? Sodium 12/03/2015 142  136 - 145 mmol/L Final   ??? Potassium 12/03/2015 3.3* 3.5 - 5.5 mmol/L Final   ??? Chloride 12/03/2015 102  100 - 108 mmol/L Final   ??? CO2 12/03/2015 29  21 - 32 mmol/L Final   ??? Anion gap 12/03/2015 11  3.0 - 18 mmol/L Final   ??? Glucose 12/03/2015 89  74 - 99 mg/dL Final   ??? BUN 12/03/2015 14  7.0 - 18 MG/DL Final   ??? Creatinine 12/03/2015 0.63  0.6 - 1.3 MG/DL Final    ??? BUN/Creatinine ratio 12/03/2015 22* 12 - 20   Final   ??? GFR est AA 12/03/2015 >60  >60 ml/min/1.47m Final   ??? GFR est non-AA 12/03/2015 >60  >60 ml/min/1.710mFinal    Comment: (NOTE)  Estimated GFR is calculated using the Modification of Diet in Renal   Disease (MDRD) Study equation, reported for both African Americans   (GFRAA) and non-African Americans (GFRNA), and normalized to 1.7352m body surface area. The physician must decide which value applies to   the patient. The MDRD study equation should only be used in   individuals age 75 38 older. It has not been validated for the   following: pregnant women, patients with serious comorbid conditions,   or on certain medications, or persons with extremes of body size,   muscle mass, or nutritional status.     ??? Calcium 12/03/2015 9.9  8.5 - 10.1 MG/DL Final   ??? Color 12/03/2015 YELLOW    Final   ??? Appearance 12/03/2015 CLEAR    Final   ??? Specific gravity 12/03/2015 1.017  1.005 - 1.030   Final   ???  pH (UA) 12/03/2015 7.0  5.0 - 8.0   Final   ??? Protein 12/03/2015 NEGATIVE   NEG mg/dL Final   ??? Glucose 12/03/2015 NEGATIVE   NEG mg/dL Final   ??? Ketone 12/03/2015 NEGATIVE   NEG mg/dL Final   ??? Bilirubin 12/03/2015 NEGATIVE   NEG   Final   ??? Blood 12/03/2015 TRACE* NEG   Final   ??? Urobilinogen 12/03/2015 0.2  0.2 - 1.0 EU/dL Final   ??? Nitrites 12/03/2015 NEGATIVE   NEG   Final   ??? Leukocyte Esterase 12/03/2015 TRACE* NEG   Final   ??? WBC 12/03/2015 4 to 10  0 - 4 /hpf Final   ??? RBC 12/03/2015 4 to 10  0 - 5 /hpf Final   ??? Epithelial cells 12/03/2015 FEW  0 - 5 /lpf Final   ??? Bacteria 12/03/2015 1+* NEG /hpf Final   ??? ALCOHOL(ETHYL),SERUM 12/03/2015 <3  0 - 3 MG/DL Final   ??? PCP(PHENCYCLIDINE) 12/03/2015 NEGATIVE   NEG   Final   ??? THC (TH-CANNABINOL) 12/03/2015 NEGATIVE   NEG   Final   ??? AMPHETAMINE 12/03/2015 NEGATIVE   NEG   Final   ??? Methamphetamines 12/03/2015 NEGATIVE     Final   ??? BARBITURATES 12/03/2015 NEGATIVE   NEG   Final    ??? BENZODIAZEPINE 12/03/2015 POSITIVE* NEG   Final   ??? COCAINE 12/03/2015 NEGATIVE   NEG   Final   ??? OPIATES 12/03/2015 POSITIVE* NEG   Final   ??? TRICYCLICS 16/09/9603 POSITIVE* NEG   Final   ??? HDSCOM 12/03/2015 Specimen analysis was performed without chain of custody handling.  These results should be used for medical purposes only and not for legal or employment purposes.  Unconfirmed screening results must not be used for non-medical purposes.    Final   ??? Sodium 12/05/2015 140  136 - 145 mmol/L Final   ??? Potassium 12/05/2015 4.1  3.5 - 5.5 mmol/L Final   ??? Chloride 12/05/2015 105  100 - 108 mmol/L Final   ??? CO2 12/05/2015 27  21 - 32 mmol/L Final   ??? Anion gap 12/05/2015 8  3.0 - 18 mmol/L Final   ??? Glucose 12/05/2015 95  74 - 99 mg/dL Final   ??? BUN 12/05/2015 10  7.0 - 18 MG/DL Final   ??? Creatinine 12/05/2015 0.60  0.6 - 1.3 MG/DL Final   ??? BUN/Creatinine ratio 12/05/2015 17  12 - 20   Final   ??? GFR est AA 12/05/2015 >60  >60 ml/min/1.21m Final   ??? GFR est non-AA 12/05/2015 >60  >60 ml/min/1.735mFinal    Comment: (NOTE)  Estimated GFR is calculated using the Modification of Diet in Renal   Disease (MDRD) Study equation, reported for both African Americans   (GFRAA) and non-African Americans (GFRNA), and normalized to 1.7335m body surface area. The physician must decide which value applies to   the patient. The MDRD study equation should only be used in   individuals age 24 70 older. It has not been validated for the   following: pregnant women, patients with serious comorbid conditions,   or on certain medications, or persons with extremes of body size,   muscle mass, or nutritional status.     ??? Calcium 12/05/2015 9.9  8.5 - 10.1 MG/DL Final   ??? WBC 12/05/2015 4.8  4.6 - 13.2 K/uL Final   ??? RBC 12/05/2015 4.45  4.20 - 5.30 M/uL Final   ??? HGB 12/05/2015 12.0  12.0 - 16.0 g/dL Final   ??? HCT 12/05/2015 37.3  35.0 - 45.0 % Final   ??? MCV 12/05/2015 83.8  74.0 - 97.0 FL Final    ??? [place] 12/05/2015 27.0  24.0 - 34.0 PG Final   ??? MCHC 12/05/2015 32.2  31.0 - 37.0 g/dL Final   ??? RDW 12/05/2015 14.6* 11.6 - 14.5 % Final   ??? PLATELET 12/05/2015 299  135 - 420 K/uL Final   ??? MPV 12/05/2015 9.6  9.2 - 11.8 FL Final   ??? NEUTROPHILS 12/05/2015 50  40 - 73 % Final   ??? LYMPHOCYTES 12/05/2015 36  21 - 52 % Final   ??? MONOCYTES 12/05/2015 13* 3 - 10 % Final   ??? EOSINOPHILS 12/05/2015 1  0 - 5 % Final   ??? BASOPHILS 12/05/2015 0  0 - 2 % Final   ??? ABS. NEUTROPHILS 12/05/2015 2.4  1.8 - 8.0 K/UL Final   ??? ABS. LYMPHOCYTES 12/05/2015 1.7  0.9 - 3.6 K/UL Final   ??? ABS. MONOCYTES 12/05/2015 0.6  0.05 - 1.2 K/UL Final   ??? ABS. EOSINOPHILS 12/05/2015 0.1  0.0 - 0.4 K/UL Final   ??? ABS. BASOPHILS 12/05/2015 0.0  0.0 - 0.1 K/UL Final   ??? DF 12/05/2015 AUTOMATED    Final   ??? Glucose (POC) 12/05/2015 128* 70 - 110 mg/dL Final    Comment: (NOTE)  The FDA has indicated that no capillary point of care blood glucose   monitoring systems are approved for use in "critically ill" patients,   however they have not defined this population. The College of   American Pathologists has recommended that these devices should not   be used in cases such as severe hypotension, dehydration, shock, and   hyperglycemic-hyperosmolar state, amongst others.  Venous or arterial   collection is the recommended specimen for testing these patients.     Hospital Outpatient Visit on 11/20/2015   Component Date Value Ref Range Status   ??? Ventricular Rate 11/20/2015 71  BPM Final   ??? Atrial Rate 11/20/2015 71  BPM Final   ??? P-R Interval 11/20/2015 146  ms Final   ??? QRS Duration 11/20/2015 72  ms Final   ??? Q-T Interval 11/20/2015 432  ms Final   ??? QTC Calculation (Bezet) 11/20/2015 469  ms Final   ??? Calculated P Axis 11/20/2015 48  degrees Final   ??? Calculated R Axis 11/20/2015 0  degrees Final   ??? Calculated T Axis 11/20/2015 23  degrees Final   ??? Diagnosis 11/20/2015    Final                    Value:Normal sinus rhythm  Normal ECG   When compared with ECG of 06-Nov-2015 14:53,  No significant change was found  Confirmed by Fredirick Maudlin [phone]) on 11/20/2015 3:16:35 PM     Office Visit on 11/12/2015   Component Date Value Ref Range Status   ??? Color (UA POC) 11/12/2015 Yellow   Preliminary   ??? Clarity (UA POC) 11/12/2015 Clear   Preliminary   ??? Glucose (UA POC) 11/12/2015 Negative  Negative Preliminary   ??? Bilirubin (UA POC) 11/12/2015 Negative  Negative Preliminary   ??? Ketones (UA POC) 11/12/2015 Negative  Negative Preliminary   ??? Specific gravity (UA POC) 11/12/2015 1.020  1.001 - 1.035 Preliminary   ??? Blood (UA POC) 11/12/2015 Negative  Negative Preliminary   ??? pH (UA POC) 11/12/2015 7.5  4.6 - 8.0  Preliminary   ??? Protein (UA POC) 11/12/2015 Negative  Negative mg/dL Preliminary   ??? Urobilinogen (UA POC) 11/12/2015 0.2 mg/dL  0.2 - 1 Preliminary   ??? Nitrites (UA POC) 11/12/2015 Negative  Negative Preliminary   ??? Leukocyte esterase (UA POC) 11/12/2015 Negative  Negative Preliminary       PFT's:  Date FEV1/FVC FEV1 Best FVC best TLC RV VC DLCO   04/15/16  95        05/10/16 105 83 78                             PFT 04/15/2016 done from PCP reviewed  Impression: Moderate restriction    Imaging:  I have personally reviewed the patient???s radiographs and have reviewed the reports:  XR Results (most recent):    Results from Hospital Encounter encounter on 03/18/16   XR CHEST PA LAT   Narrative CHEST PA AND LATERAL:    CPT CODE: 41324    COMPARISON: 11/20/2015    INDICATION: Dry cough for one month right-sided pressure and chest pain for one  month.    FINDINGS: The heart is slightly enlarged. The aorta is tortuous. There are areas  of parenchymal scarring in the lung bases bilaterally. No acute infiltrates are  seen. No pleural effusions are seen. Prominent degenerative changes are seen  involving the thoracic spine and right shoulder.         Impression Impression:    Mild cardiomegaly with no evidence of active pulmonary disease. Chronic   parenchymal scarring lung bases. No change from 11/20/2015.       CT Results (most recent):  Review of CTA chest 11/06/15: No significant abnormalities of the lung parenchyma pulmonary emboli noted by radiologist  O2 sat room air at rest 96%  ??  Impression:   Pulmonary emboli with no etiology such as no recent surgery trauma or history of recurrent thrombosis or family history. Of consult concern would be the possibility of a malignancy but the patient has had a hysterectomy and has frequent mammograms so this would be unlikely. She does however have a significant history of recent dysphagia along with unusual anterior chest wall and clavicle discomfort and therefore it would be reasonable to rule out esophageal malignancy    Results from Hospital Encounter encounter on 02/02/16   CT SPINE LUMB WO CONT   Narrative CT LUMBAR SPINE    CPT code: 40102    History: Follow-up back surgery of September 2016.  Asymptomatic.    Findings: Helical axial non-contrast images of the lumbar spine are obtained  from T11-12 through S1-2 and reviewed in soft tissue and bone windows, as  appropriate.  Additional sagittal and coronal reconstructions were obtained to  better delineate vertebral body alignment, intervertebral disc spaces, facet  joints and neural foramina which are not well seen in the axial imaging plane.    There is grossly normal vertebral body alignment.  There has been anterior  lumbar interbody fusion with artificial spacers at L4-5 and L5-S1.  There has  been posterior fixation and fusion with bilateral pedicle screws and  longitudinal rods L4-S1 and there has been a decompressive laminectomy at L5.   There may been a partial facetectomy on the left at L5-S1.  There is no evidence  of fracture or other significant bony abnormality.  There may be mild narrowing  at L2-3.  Other disc spaces are preserved.  Canal and foramina are patent  throughout.  The adjacent soft tissues are unremarkable.          Impression IMPRESSION::     Status post ALIF at L4-5 and L5-S1 with bilateral posterior fixation and fusion  L4-S1 with L5 decompressive laminectomy.    Mild disc space narrowing L2-3.  Canal and foramina are patent throughout.            Results for orders placed or performed in visit on 04/27/16   AMB POC EKG ROUTINE W/ 12 LEADS, INTER & REP    Impression    See progress note.   Results for orders placed or performed during the hospital encounter of 12/03/15   EKG, 12 LEAD, INITIAL   Result Value Ref Range    Ventricular Rate 84 BPM    Atrial Rate 84 BPM    P-R Interval 156 ms    QRS Duration 88 ms    Q-T Interval 380 ms    QTC Calculation (Bezet) 449 ms    Calculated P Axis 53 degrees    Calculated R Axis -2 degrees    Calculated T Axis 26 degrees    Diagnosis       Sinus rhythm with premature atrial complexes  Otherwise normal ECG  When compared with ECG of 20-Nov-2015 11:56,  premature atrial complexes are now present  Confirmed by Burnell Blanks MD [phone]) on 12/04/2015 8:45:33 AM        [place] Results (most recent):    Results from Hospital Encounter encounter on 01/01/16   US BREAST LT LIMITED=<3 QUAD   Narrative STUDY:    Diagnostic Mammogram, Left  Breast Ultrasound, Left    INDICATION:  Screening callback for microcalcifications within the left breast  and 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 They consist of approximately 5 punctate uniform calcifications  measuring 3 mm in extent. No mass or distortion.    With spot compression, the tubular nodularity seen within the 6:00 left breast  appears to represent duct ectasia. Ultrasound was utilized for further  evaluation.    Ultrasound, Left:  Ultrasound of the retroareolar and 6:00 left breast shows   moderate duct ectasia without intraductal mass. The patient denies nipple  discharge.         Impression IMPRESSION:     1. 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   2. 3 mm cluster of probably benign microcalcifications near the posterior 4:00  left breast.  3. Six-month follow-up left diagnostic mammogram and ultrasound recommended to  document stability.    BIRADS 3:  Probably Benign      Lab Results   Component Value Date/Time    D DIMER <0.27 01/18/2016 11:30 AM             Ms. Leckey has a reminder for a "due or due soon" health maintenance. I have asked that she contact her primary care provider for follow-up on this health maintenance.     IMPRESSION:   ?? Pre op clearance for Esophageal dilatation and UGI endsocopy.  ?? 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 Mild intermittent asthma, symptoms currently compensated.  ?? Hx Dysphagia -GI following.  ?? Hx OSA-not on  CPAP( moved here from [place] before      RECOMMENDATIONS:   ?? Low risk for pulmonary standpoint for complication, pt currently no symptoms of SOB or signs of right heart failure to suggest pulmonary hypertension or heart failure, may proceed for the scheduled procedure.  Recommend to hold Xarelto at least for 48 hrs and restart the day after the procedure unless contraindicated such as bleeding. Consider bridging to Lovenox if any concern regarding holding Xarelto.  ?? Dr Domingo Cocking spoke with Dr Wynell Balloon regarding recommendation for repeat CTA at this time, no recommendation for repeat CTA chest at this time since pt is asymptomatic for SOB and lower extremity edema suggest for pulmonary hypertension or any active pulmonary disease.  Recommend to placed pt on CPAP at night after surgery due to hx OSA-currently untreated.  ?? PFTs done from PCP office and here both reviewed with Dr Domingo Cocking  ?? Reviewed all medications and discussed concerns, answered questions.   ?? Split study to evaluate sleep apnea and if needed CPAP treatment.  ?? Follow-up in 6 wk with Dr Timmothy Sours after split study, or come back sooner should new symptoms or problems arise.       Larrie Kass, NP

## 2016-05-10 NOTE — Patient Instructions (Signed)
Sleep Apnea: Care Instructions  Your Care Instructions  Sleep apnea means that you frequently stop breathing for 10 seconds or longer during sleep. It can be mild to severe, based on the number of times an hour that you stop breathing or have slowed breathing.  Blocked or narrowed airways in your nose, mouth, or throat can cause sleep apnea. Your airway can become blocked when your throat muscles and tongue relax during sleep.  You can treat sleep apnea at home by making lifestyle changes. You also can use a CPAP breathing machine that keeps tissues in the throat from blocking your airway. Or your doctor may suggest that you use a breathing device while you sleep. It helps keep your airway open. This could be a device that you put in your mouth. Other examples include strips or disks that you use on your nose. In some cases, surgery may be needed to remove enlarged tissues in the throat.  Follow-up care is a key part of your treatment and safety. Be sure to make and go to all appointments, and call your doctor if you are having problems. It's also a good idea to know your test results and keep a list of the medicines you take.  How can you care for yourself at home?  ?? Lose weight, if needed. It may reduce the number of times you stop breathing or have slowed breathing.  ?? Sleep on your side. It may stop mild apnea. If you tend to roll onto your back, sew a pocket in the back of your pajama top. Put a tennis ball into the pocket, and stitch the pocket shut. This will help keep you from sleeping on your back.  ?? Avoid alcohol and medicines such as sleeping pills and sedatives before bed.  ?? Do not smoke. Smoking can make sleep apnea worse. If you need help quitting, talk to your doctor about stop-smoking programs and medicines. These can increase your chances of quitting for good.  ?? Prop up the head of your bed 4 to 6 inches by putting bricks under the legs of the bed.   ?? Treat breathing problems, such as a stuffy nose, caused by a cold or allergies.  ?? Try a continuous positive airway pressure (CPAP) breathing machine if your doctor recommends it. The machine keeps your airway open when you sleep.  ?? If CPAP does not work for you, ask your doctor if you can try other breathing machines. A bilevel positive airway pressure machine uses one type of air pressure for breathing in and another type for breathing out. Another device raises or lowers air pressure as needed while you breathe.  ?? Talk to your doctor if:  ?? Your nose feels dry or bleeds when you use one of these machines. You may need to increase moisture in the air. A humidifier may help.  ?? Your nose is runny or stuffy from using a breathing machine. Decongestants or a corticosteroid nasal spray may help.  ?? You are sleepy during the day and it gets in the way of the normal things you do. Do not drive when you are drowsy.  When should you call for help?  Watch closely for changes in your health, and be sure to contact your doctor if:  ?? You still have sleep apnea even though you have made lifestyle changes.  ?? You are thinking of trying a device such as CPAP.  ?? You are having problems using a CPAP or similar machine.    Where can you learn more?  Go to http://www.healthwise.net/[employer]Help[employer]ctions.  Enter J936 in the search box to learn more about "Sleep Apnea: Care Instructions."  Current as of: Apr 27, 2015  Content Version: 11.2  ?? 2006-2017 [employer]. Care instructions adapted under license by [employer] (which disclaims liability or warranty for this information). If you have questions about a medical condition or this instruction, always ask your healthcare professional. [employer] disclaims any warranty or liability for your use of this information.

## 2016-05-16 ENCOUNTER — Ambulatory Visit

## 2016-05-16 ENCOUNTER — Inpatient Hospital Stay: Admit: 2016-05-16 | Payer: PRIVATE HEALTH INSURANCE | Attending: Family Medicine | Primary: Family Medicine

## 2016-05-16 ENCOUNTER — Encounter

## 2016-05-16 DIAGNOSIS — I2699 Other pulmonary embolism without acute cor pulmonale: Secondary | ICD-10-CM

## 2016-05-16 DIAGNOSIS — Z09 Encounter for follow-up examination after completed treatment for conditions other than malignant neoplasm: Secondary | ICD-10-CM

## 2016-05-16 MED ORDER — IOPAMIDOL 76 % IV SOLN
370 mg iodine /mL (76 %) | Freq: Once | INTRAVENOUS | Status: AC
Start: 2016-05-16 — End: 2016-05-16
  Administered 2016-05-16: 18:00:00 via INTRAVENOUS

## 2016-05-16 MED FILL — ISOVUE-370  76 % INTRAVENOUS SOLUTION: 370 mg iodine /mL (76 %) | INTRAVENOUS | Qty: 100

## 2016-05-17 LAB — CREATININE, POC
Creatinine, POC: 0.8 MG/DL (ref 0.6–1.3)
GFRAA, POC: >60 mL/min/{1.73_m2} (ref >60)
GFRNA, POC: >60 mL/min/{1.73_m2} (ref >60)

## 2016-05-18 ENCOUNTER — Ambulatory Visit: Payer: PRIVATE HEALTH INSURANCE | Primary: Family Medicine

## 2016-05-24 DIAGNOSIS — G4733 Obstructive sleep apnea (adult) (pediatric): Secondary | ICD-10-CM

## 2016-05-24 NOTE — Progress Notes (Signed)
??   Patient arrived for sleep study.  ?? Physician orders were reviewed.   ?? Patient education to include initiation of PAP therapy per protocol.  ?? Patient questions were answered.  ?? The patient demonstrated good understanding of the positive airway pressure device.

## 2016-05-25 ENCOUNTER — Inpatient Hospital Stay: Admit: 2016-05-25 | Payer: PRIVATE HEALTH INSURANCE | Attending: Nurse Practitioner | Primary: Family Medicine

## 2016-05-25 NOTE — Progress Notes (Signed)
?   Sleep study completed per physician order.   ? Patient discharged from sleep center.  ? Patient awake, alert and able to leave the facility under their own power.  ? Instructed to follow up with their sleep physician for results.

## 2016-06-01 ENCOUNTER — Ambulatory Visit: Admit: 2016-06-01 | Discharge: 2016-06-01 | Payer: MEDICARE | Attending: Specialist | Primary: Family Medicine

## 2016-06-01 DIAGNOSIS — M7552 Bursitis of left shoulder: Secondary | ICD-10-CM

## 2016-06-01 MED ORDER — BUPIVACAINE (PF) 0.25 % (2.5 MG/ML) IJ SOLN
0.25 % (2.5 mg/mL) | Freq: Once | INTRAMUSCULAR | 0 refills | Status: AC
Start: 2016-06-01 — End: 2016-06-01

## 2016-06-01 MED ORDER — BETAMETHASONE ACET & SOD PHOS 6 MG/ML SUSP FOR INJECTION
6 mg/mL | Freq: Once | INTRAMUSCULAR | 0 refills | Status: AC
Start: 2016-06-01 — End: 2016-06-01

## 2016-06-01 NOTE — Progress Notes (Signed)
Patient: Cynthia Marsh                MRN: [id number]       SSN: [id number]  Date of Birth: 05/16/1951        AGE: 65 y.o.        SEX: female    PCP: Lenn SinkSamir Abdelshaheed, MD  06/01/16    Chief Complaint   Patient presents with   ??? Shoulder Pain     Left     HISTORY:  Cynthia Marsh is a 65 y.o. female who is seen for a 1 month history of worsening spontaneous onset left shoulder pain.  She states no history of injury. She states that she experiencing severe pain and is now unable to move her shoulder even to get up from a chair. She states she has been unable to sleep because of the pain. She states the pain radiates from her left shoulder down through her left arm. She states she has been holding her left arm to her chest. She notes she is [place] rot[place] in [place]. She states she is s/p lumbar [place] in [place], [place] many years ago.  She states that her back surgery made her worse. She is currently being seen by Dr. Wilford CornerArora who has prescribed Flexiril, Lyrica, and hydrocodone.     Pain Assessment  05/06/2016   Location of Pain Knee   Location Modifiers Right   Severity of Pain 0   Quality of Pain Aching   Duration of Pain -   Frequency of Pain Intermittent   Aggravating Factors Stairs;Walking;Standing;Exercise   Limiting Behavior Yes   Relieving Factors Rest   Relieving Factors Comment -   Result of Injury No   Work-Related Injury -   Type of Injury -     Occupation, etc:  Cynthia Marsh states that she has been disabled since 2010 for her back problems. 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 She recently moved from [place] 2 years ago because her daughter is a Runner, broadcasting/film/videoteacher in [place]. She [place] [address] [place] her female friend. She has two grown children.  Current weight is 180 pounds.  She is 5'4" tall.  She is diabetic and hypertensive.     Lab Results    Component Value Date/Time    Hemoglobin A1c 6.4 11/06/2015 02:24 PM     Weight Metrics 06/01/2016 05/10/2016 05/06/2016 05/06/2016 04/27/2016 04/06/2016 03/29/2016   Weight 180 lb 6.4 oz 180 lb 178 lb 178 lb 183 lb 177 lb 3.2 oz 172 lb   BMI 30.97 kg/m2 30.9 kg/m2 30.55 kg/m2 30.55 kg/m2 31.41 kg/m2 30.42 kg/m2 29.52 kg/m2   Some encounter information is confidential and restricted. Go to Review Flowsheets activity to see all data.       Patient Active Problem List   Diagnosis Code   ??? Depression F32.9   ??? Anxiety F41.9   ??? Insomnia G47.00   ??? Mild intermittent asthma without complication J45.20   ??? Chronic pain of right knee M25.561, G89.29   ??? Type 2 diabetes mellitus without complication (HCC) E11.9   ??? Essential hypertension with goal blood pressure less than 140/90 I10   ??? Intractable migraine without aura and without status migrainosus G43.019   ??? Hx of spinal fusion Z98.1   ??? Atypical chest pain R07.89   ??? Acute  pulmonary embolism (HCC) I26.99   ??? Pulmonary emboli (HCC) I26.99   ??? Hypokalemia E87.6   ??? Ataxia R27.0   ??? Altered mental status R41.82   ??? Encephalopathy acute G93.40   ??? History of lumbar fusion, L4-sacrum Z98.890   ??? Encounter for long-term (current) use of medications Z79.899   ??? Post laminectomy syndrome M96.1   ??? 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 History of depression Z86.59   ??? History of anxiety Z86.59   ??? 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 Essential hypertension I10   ??? Dyslipidemia E78.5   ??? Type 2 diabetes mellitus without complication, without long-term current use of insulin (HCC) E11.9     REVIEW OF SYSTEMS: All Below are Negative except: See HPI   Constitutional: negative for fever, chills, and weight loss.   Cardiovascular: negative for chest pain, claudication, leg swelling, SOB, DOE   Gastrointestinal: Negative for pain, N/V/C/D, Blood in stool or urine, dysuria,  hematuria, incontinence, pelvic pain.   Musculoskeletal: See HPI    Neurological: Negative for dizziness and weakness.   Negative for headaches, Visual changes, confusion, seizures   Phychiatric/Behavioral: Negative for depression, memory loss, substance  abuse.    Extremities: Negative for hair changes, rash, or skin lesion changes.   Hematologic: Negative for bleeding problems, bruising, pallor or swollen lymph  nodes   Peripheral Vascular: No calf pain, no circulation deficits.    Social History     Social History   ??? Marital status: SINGLE     Spouse name: N/A   ??? Number of children: N/A   ??? Years of education: N/A     Occupational History   ??? Not on file.     Social History Main Topics   ??? Smoking status: Never Smoker   ??? Smokeless tobacco: Never Used   ??? Alcohol use Yes      Comment: on occasion   ??? Drug use: No   ??? Sexual activity: Not on file     Other Topics Concern   ??? Not on file     Social History Narrative      Allergies   Allergen Reactions   ??? 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   ??? Almond Other (comments)     Tongue itches   ??? Carvedilol Other (comments)   ??? Cozaar [Losartan] Cough   ??? Crestor [Rosuvastatin] Unknown (comments)     Heart beats fast      Current Outpatient Prescriptions   Medication Sig   ??? oxyCODONE-acetaminophen (PERCOCET 10) 10-325 mg per tablet Take  by mouth.   ??? tiotropium bromide (SPIRIVA RESPIMAT) 1.25 mcg/actuation inhaler Take 2 Puffs by inhalation daily.   ??? fluticasone (FLOVENT DISKUS) 100 mcg/actuation dsdv Take  by inhalation.   ??? meloxicam (MOBIC) 15 mg tablet Take 1 Tab by mouth daily.   ??? hydrALAZINE (APRESOLINE) 25 mg tablet Take 25 mg by mouth two (2) times a day.   ??? budesonide-formoterol (SYMBICORT) 80-4.5 mcg/actuation HFAA inhaler Take 2 Puffs by inhalation two (2) times a day.   ??? FREESTYLE LITE STRIPS strip USE TO CHECK BLOOD SUGAR BID   ??? busPIRone (BUSPAR) 15 mg tablet TK 1 T PO TID   ??? FREESTYLE LANCETS 28 gauge misc CHECK BLOOD SUGAR BID    ??? JANUMET 50-1,000 mg per tablet TK 1 T PO BID. STOP TAKING METFORMIN.   ??? pregabalin (LYRICA) 75 mg  capsule Take 1 Cap by mouth three (3) times daily. Max Daily Amount: 225 mg.   ??? cyclobenzaprine (FLEXERIL) 10 mg tablet Take 1 Tab by mouth three (3) times daily as needed for Muscle Spasm(s).   ??? XARELTO 20 mg tab tablet Take 1 Tab by mouth daily.   ??? amLODIPine (NORVASC) 10 mg tablet Take 1 Tab by mouth daily.   ??? azelastine (ASTELIN) 137 mcg (0.1 %) nasal spray 1 Spray by Both Nostrils route two (2) times a day.   ??? DULoxetine (CYMBALTA) 30 mg capsule Take 1 Cap by mouth daily.   ??? fluticasone (FLONASE) 50 mcg/actuation nasal spray 2 Sprays by Both Nostrils route nightly.   ??? hydroCHLOROthiazide (HYDRODIURIL) 25 mg tablet Take 1 Tab by mouth daily.   ??? mirtazapine (REMERON) 30 mg tablet Take 1 Tab by mouth daily.   ??? montelukast (SINGULAIR) 10 mg tablet Take 1 Tab by mouth daily.   ??? omeprazole (PRILOSEC) 20 mg capsule Take 1 Cap by mouth daily.   ??? oxybutynin chloride XL (DITROPAN XL) 10 mg CR tablet Take 1 Tab by mouth daily.   ??? pravastatin (PRAVACHOL) 40 mg tablet Take 1 Tab by mouth daily.   ??? zolpidem (AMBIEN) 10 mg tablet Take 1 Tab by mouth nightly.   ??? trimethoprim-polymyxin b (POLYTRIM) ophthalmic solution Administer 1 Drop to both eyes every four (4) hours.   ??? ALPRAZolam (XANAX) 0.5 mg tablet TK 1 T PO BID.  MAXIMUM 2 TS D.     No current facility-administered medications for this visit.       PHYSICAL EXAMINATION:  Visit Vitals   ??? BP 129/79   ??? Pulse 90   ??? Temp 97.4 ??F (36.3 ??C) (Oral)   ??? Ht 5\' 4"  (1.626 m)   ??? Wt 180 lb 6.4 oz (81.8 kg)   ??? BMI 30.97 kg/m2      ORTHO EXAMINATION:  Examination Neck   Skin Intact   Tenderness +   Tightness +   Flexion Decreased 25%   Extension Decreased 25%   Lateral bend left Normal   Lateral bend right Normal   Masses -   Biceps reflex Normal   Triceps reflex Normal   Brachioradialis reflex Normal       Examination Right shoulder Left shoulder    Skin Intact Intact   Effusion - -   Biceps deformity - -   Atrophy - -   AC joint tenderness - -   Acromial tenderness + +   Biceps tenderness - -   Forward flexion/Elevation ROM 170 170, with crepitatoin   Active abduction ROM 160 160   External rotation ROM 40 35   Internal rotation ROM 90 85   Apprehension - -   Impingement - +   Drop Arm Test - -   Neurovascular Intact Intact   She ambulates using a rollator walker  PROCEDURE:  After discussing treatment options, patient's left shoulder was injected with 4 cc Marcaine and 1/2 cc Celestone.    Chart reviewed for the following:   I, Tonia BroomsSteven C Amelda Hapke, MD, have reviewed the History, Physical and updated the Allergic reactions for Cynthia Marsh     TIME OUT performed immediately prior to start of procedure:  I, Tonia BroomsSteven C Jamarian Jacinto, MD, have performed the following reviews on Andre M Peden prior to the start of the procedure:            * Patient was identified by name and date of birth   *  Agreement on procedure being performed was verified  * Risks and Benefits explained to the patient  * Procedure site verified and marked as necessary  * Patient was positioned for comfort  * Consent was obtained     Time: 9:53 AM     Date of procedure: 06/01/2016    Procedure performed by:  Tonia Brooms, MD    Ms. Randa tolerated the procedure well with no complications.    RADIOGRAPHS:  XR SHOULDER LEFT 06/01/16  IMPRESSION:  Three views - No fractures, mild acromioclavicular narrowing, no glenohumeral narrowing, no calcific densities.    XR CERVICAL SPINE 06/01/16  IMPRESSION:  Two views - No fractures, no disc space narrowing, no prevertebral swelling, no subluxations. Moderate facet joint space narrowing    IMPRESSION:      ICD-10-CM ICD-9-CM    1. 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      DRAIN/INJECT LARGE JOINT/BURSA       bupivacaine, PF, (MARCAINE, PF,) 0.25 % (2.5 mg/mL) injection      REFERRAL TO PHYSICAL THERAPY    mild   2. Left shoulder pain, unspecified chronicity M25.512 719.41 AMB POC XRAY, SHOULDER; COMPLETE, 2+      betamethasone (CELESTONE SOLUSPAN) 6 mg/mL injection      BETAMETHASONE ACETATE & SODIUM PHOSPHATE INJECTION 3 MG EA.      DRAIN/INJECT LARGE JOINT/BURSA      bupivacaine, PF, (MARCAINE, PF,) 0.25 % (2.5 mg/mL) injection      REFERRAL TO PHYSICAL THERAPY   3. History of lumbar fusion, L4-sacrum Z98.890 V45.4 AMB POC XRAY, SPINE, CERVICAL; 2 OR 3      REFERRAL TO PHYSICAL THERAPY   4. Left sided sciatica M54.32 724.3    5. BMI 30.0-30.9,adult Z68.30 V85.30    6. 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      DRAIN/INJECT LARGE JOINT/BURSA      bupivacaine, PF, (MARCAINE, PF,) 0.25 % (2.5 mg/mL) injection      REFERRAL TO PHYSICAL THERAPY   7. Bursitis of shoulder, left M75.52 726.10 betamethasone (CELESTONE SOLUSPAN) 6 mg/mL injection      BETAMETHASONE ACETATE & SODIUM PHOSPHATE INJECTION 3 MG EA.      DRAIN/INJECT LARGE JOINT/BURSA      bupivacaine, PF, (MARCAINE, PF,) 0.25 % (2.5 mg/mL) injection      REFERRAL TO PHYSICAL THERAPY     PLAN:  After discussing treatment options, patient's left shoulder was injected with 4 cc Marcaine and 1/2 cc Celestone. She will start a brief course of outpatient physical therapy to her left shoudler.  She will follow up as needed.  We discussed a possible left shoulder MRI in the future if pain continues.      Scribed by Deatra Canter (Scribekick) as dictated by Tonia Brooms, MD

## 2016-06-01 NOTE — Progress Notes (Signed)
Chief Complaint   Patient presents with   ??? Shoulder Pain     Left     0/10 pain.

## 2016-06-01 NOTE — Patient Instructions (Signed)
Shoulder Bursitis: Exercises  Your Care Instructions  Here are some examples of typical rehabilitation exercises for your condition. Start each exercise slowly. Ease off the exercise if you start to have pain.  Your doctor or physical therapist will tell you when you can start these exercises and which ones will work best for you.  How to do the exercises  Posterior stretching exercise    1. Hold the elbow of your injured arm with your other hand.  2. Use your hand to pull your injured arm gently up and across your body. You will feel a gentle stretch across the back of your injured shoulder.  3. Hold for at least 15 to 30 seconds. Then slowly lower your arm.  4. Repeat 2 to 4 times.  Up-the-back stretch    Note: Your doctor or physical therapist may want you to wait to do this stretch until you have regained most of your range of motion and strength. You can do this stretch in different ways. Hold any of these stretches for at least 15 to 30 seconds. Repeat them 2 to 4 times.  1. Put your hand in your back pocket. Let it rest there to stretch your shoulder.  2. With your other hand, hold your injured arm (palm outward) behind your back by the wrist. Pull your arm up gently to stretch your shoulder.  3. Next, put a towel over your other shoulder. Put the hand of your injured arm behind your back. Now hold the back end of the towel. With the other hand, hold the front end of the towel in front of your body. Pull gently on the front end of the towel. This will bring your hand farther up your back to stretch your shoulder.  Overhead stretch    1. Standing about an arm's length away, grasp onto a solid surface. You could use a countertop, a doorknob, or the back of a sturdy chair.  2. With your knees slightly bent, bend forward with your arms straight. Lower your upper body, and let your shoulders stretch.  3. As your shoulders are able to stretch farther, you may need to take a step or two backward.   4. Hold for at least 15 to 30 seconds. Then stand up and relax. If you had stepped back during your stretch, step forward so you can keep your hands on the solid surface.  5. Repeat 2 to 4 times.  Shoulder flexion (lying down)    Note: To make a wand for this exercise, use a piece of PVC pipe or a broom handle with the broom removed. Make the wand about a foot wider than your shoulders.  1. Lie on your back, holding a wand with both hands. Your palms should face down as you hold the wand.  2. Keeping your elbows straight, slowly raise your arms over your head. Raise them until you feel a stretch in your shoulders, upper back, and chest.  3. Hold for 15 to 30 seconds.  4. Repeat 2 to 4 times.  Shoulder rotation (lying down)    Note: To make a wand for this exercise, use a piece of PVC pipe or a broom handle with the broom removed. Make the wand about a foot wider than your shoulders.  1. Lie on your back. Hold a wand with both hands with your elbows bent and palms up.  2. Keep your elbows close to your body, and move the wand across your body toward the sore   arm.  3. Hold for 8 to 12 seconds.  4. Repeat 2 to 4 times.  Shoulder blade squeeze    1. Stand with your arms at your sides, and squeeze your shoulder blades together. Do not raise your shoulders up as you squeeze.  2. Hold 6 seconds.  3. Repeat 8 to 12 times.  Shoulder flexor and extensor exercise    Note: These are isometric exercises. That means you contract your muscles without actually moving.  ?? Push forward (flex): Stand facing a wall or doorjamb, about 6 inches or less back. Hold your injured arm against your body. Make a closed fist with your thumb on top. Then gently push your hand forward into the wall with about 25% to 50% of your strength. Don't let your body move backward as you push. Hold for about 6 seconds. Relax for a few seconds. Repeat 8 to 12 times.  ?? Push backward (extend): Stand with your back flat against a wall. Your  upper arm should be against the wall, with your elbow bent 90 degrees (your hand straight ahead). Push your elbow gently back against the wall with about 25% to 50% of your strength. Don't let your body move forward as you push. Hold for about 6 seconds. Relax for a few seconds. Repeat 8 to 12 times.  Scapular exercise: Wall push-ups    Note: This exercise is best done with your fingers somewhat turned out, rather than straight up and down.  1. Stand facing a wall, about 12 inches to 18 inches away.  2. Place your hands on the wall at shoulder height.  3. Slowly bend your elbows and bring your face to the wall. Keep your back and hips straight.  4. Push back to where you started.  5. Repeat 8 to 12 times.  6. When you can do this exercise against a wall comfortably, you can try it against a counter. You can then slowly progress to the end of a couch, then to a sturdy chair, and finally to the floor.  Scapular exercise: Retraction    Note: For this exercise, you will need elastic exercise material, such as surgical tubing or Thera-Band.  1. Put the band around a solid object at about waist level. (A bedpost will work well.) Each hand should hold an end of the band.  2. With your elbows at your sides and bent to 90 degrees, pull the band back. Your shoulder blades should move toward each other. Then move your arms back where you started.  3. Repeat 8 to 12 times.  4. If you have good range of motion in your shoulders, try this exercise with your arms lifted out to the sides. Keep your elbows at a 90-degree angle. Raise the elastic band up to about shoulder level. Pull the band back to move your shoulder blades toward each other. Then move your arms back where you started.  Internal rotator strengthening exercise    1. Start by tying a piece of elastic exercise material to a doorknob. You can use surgical tubing or Thera-Band.  2. Stand or sit with your shoulder relaxed and your elbow bent 90 degrees.  Your upper arm should rest comfortably against your side. Squeeze a rolled towel between your elbow and your body for comfort. This will help keep your arm at your side.  3. Hold one end of the elastic band in the hand of the painful arm.  4. Slowly rotate your forearm toward your body until   it touches your belly. Slowly move it back to where you started.  5. Keep your elbow and upper arm firmly tucked against the towel roll or at your side.  6. Repeat 8 to 12 times.  External rotator strengthening exercise    1. Start by tying a piece of elastic exercise material to a doorknob. You can use surgical tubing or Thera-Band. (You may also hold one end of the band in each hand.)  2. Stand or sit with your shoulder relaxed and your elbow bent 90 degrees. Your upper arm should rest comfortably against your side. Squeeze a rolled towel between your elbow and your body for comfort. This will help keep your arm at your side.  3. Hold one end of the elastic band with the hand of the painful arm.  4. Start with your forearm across your belly. Slowly rotate the forearm out away from your body. Keep your elbow and upper arm tucked against the towel roll or the side of your body until you begin to feel tightness in your shoulder. Slowly move your arm back to where you started.  5. Repeat 8 to 12 times.  Follow-up care is a key part of your treatment and safety. Be sure to make and go to all appointments, and call your doctor if you are having problems. It's also a good idea to know your test results and keep a list of the medicines you take.  Where can you learn more?  Go to http://www.healthwise.net/GoodHelp[employer]ctions.  Enter Z022 in the search box to learn more about "Shoulder Bursitis: Exercises."  Current as of: February 23, 2016  Content Version: 11.3  ?? 2006-2017 [employer]. Care instructions adapted under license by [employer] (which disclaims liability or warranty  for this information). If you have questions about a medical condition or this instruction, always ask your healthcare professional. Healthwise, Inc[employer] disclaims any warranty or liability for your use of this information.       Joint Injections: Care Instructions  Your Care Instructions  Joint injections are shots into a joint, such as the knee. They may be used to put in medicines, such as pain relievers. Or they can be used to take out fluid. Sometimes the fluid is tested in a lab. This can help find the cause of a joint problem.  A corticosteroid, or steroid, shot is used to reduce inflammation in tendons or joints. It is often used to treat problems such as arthritis, tendinitis, and bursitis.  Steroids can be injected directly into a painful, inflamed joint. They can also help reduce inflammation of a bursa. A bursa is a sac of fluid. It cushions and lubricates areas where tendons, ligaments, skin, muscles, or bones rub against each other.  A steroid shot can sometimes help with short-term pain relief when other treatments haven't worked. If steroid shots help, pain may improve for weeks or months.  Follow-up care is a key part of your treatment and safety. Be sure to make and go to all appointments, and call your doctor if you are having problems. It's also a good idea to know your test results and keep a list of the medicines you take.  How can you care for yourself at home?  ?? Put ice or a cold pack on the area for 10 to 20 minutes at a time. Put a thin cloth between the ice and your skin.  ?? Take anti-inflammatory medicines to reduce pain, swelling, or inflammation. These include ibuprofen (Advil, Motrin)   and naproxen (Aleve). Read and follow all instructions on the label.  ?? Avoid strenuous activities for several days, especially those that put stress on the area where you got the shot.  ?? If you have dressings over the area, keep them clean and dry. You may  remove them when your doctor tells you to.  When should you call for help?  Call your doctor now or seek immediate medical care if:  ?? You have signs of infection, such as:  ?? Increased pain, swelling, warmth, or redness.  ?? Red streaks leading from the site.  ?? Pus draining from the site.  ?? A fever.  Watch closely for changes in your health, and be sure to contact your doctor if you have any problems.  Where can you learn more?  Go to http://www.healthwise.net/GoodHelpConnections.  Enter N616 in the search box to learn more about "Joint Injections: Care Instructions."  Current as of: February 23, 2016  Content Version: 11.3  ?? 2006-2017 [employer]. Care instructions adapted under license by [employer] (which disclaims liability or warranty for this information). If you have questions about a medical condition or this instruction, always ask your healthcare professional. [employer] disclaims any warranty or liability for your use of this information.

## 2016-06-03 ENCOUNTER — Inpatient Hospital Stay: Admit: 2016-06-03 | Payer: PRIVATE HEALTH INSURANCE | Primary: Family Medicine

## 2016-06-03 DIAGNOSIS — M25512 Pain in left shoulder: Secondary | ICD-10-CM

## 2016-06-03 NOTE — Progress Notes (Signed)
PT DAILY TREATMENT NOTE/SHOULDER EVAL 3-16    Patient Name: Horace PorteousFrankie M Allocca  Date:06/03/2016  DOB: 09/07/1951    Patient DOB Verified  Payor: [place] [employer] / Plan: [place] [employer] / Product Type: Managed Care Medicare /    In time:8:33am  Out time:9:04am  Total Treatment Time (min): 31  Total Timed Codes (min): 31  1:1 Treatment Time ([facility] only): 31   Visit #: 1 of 1    Treatment Area: Left shoulder pain [M25.512]    SUBJECTIVE  Pain Level (0-10 scale): 0  constant intermittent improving worsening no change since onset    Any medication changes, allergies to medications, adverse drug reactions, diagnosis change, or new procedure performed?:  No     Yes (see summary sheet for update)  Subjective functional status/changes:     Pt c/o intermittent L shoulder pain that comes and goes starting 1 month ago that will intermittently limit her ability to push up from bed, reach overhead, dress, and perform household chores. She reports sometimes pain is not present, but when it is, it reaches into her L lateral shoulder and that she finds relief with holding her arm against her body  With her contralateral arm. She reports no pain upon arrival today.    PLOF: sedentary, but independent void of shoulder pain  Limitations to PLOF: limited household chores, intermittent limited lifting, OH reach, pain intermittently when arm hangs at side, unable to push up from bed or chair intermittently d/t pain.  Mechanism of Injury: insidious onset  Current symptoms/Complaints: pt reports no pain currently since injection 2 days ago  Previous Treatment/Compliance: L shoulder injection 06/01/2016 (pt reports steroid injection) with some relief  PMHx/Surgical Hx: 2x L/S fusions per pt report, R RTC repair 1999, arthritis R knee, DMII, HTN  Work Hx: on disability d/t back problems per pt report  Living Situation:   Pt Goals: "Get back to normal."   Barriers: pain financial time transportation other  Motivation: pt appears well motivated  Substance use: Alcohol Tobacco other:   FABQ Score: low elevate  Cognition: A & O x 4    Other:    OBJECTIVE/EXAMINATION  Domestic Life: see above  Activity/Recreational Limitations: see above  Mobility:   Self Care:     31 min Eval                  Re-Eval           With    TE    TA    neuro    other: Patient Education:  Review HEP     Progressed/Changed HEP based on:    positioning    body mechanics    transfers    heat/ice application     other:      Other Objective/Functional Measures:    Physical Therapy Evaluation - Shoulder    Posture:  Poor     Fair     Good    Describe:    ROM:   Unable to assess at this time                                           AROM  PROM   Left Right  Left Right   Flexion 140 141 Flexion 171 160   Extension   Extension 78 pain at end range overpressure only 70   Scaption/ABD 118 116 Scaptin/ABD 170 131   ER @ 0 Degrees   ER @ 0 Degrees     ER @ 90 Degrees   ER @ 90 Degrees 96 81   IR @ 90 Degrees   IR @ 90 Degrees 72 62     FER: R T2, L T1 no pain  FIR: R T9, L T7 no pain    C/S AROM: Rotation R 68, L 69, SB R 20, L 19, Flex 38, ext 33 no pain    End Feel / Painful Arc: (-)    Strength:    Unable to assess at this time                                                                            L (1-5) R (1-5) Pain   Flexors 4 4  Yes    No   Abductors 4 4  Yes    No   External Rotators 4 4  Yes    No   Internal Rotators 4+ 4+  Yes    No   Supraspinatus    Yes    No   Serratus Anterior    Yes    No   Lower Trapezius    Yes    No   Elbow Flexion 5 5  Yes    No   Elbow Extension 5 5  Yes    No       Scapulohumoral Control / Rhythm:   Able to eccentrically lower with good control? Left:  Yes    No     Right:  Yes    No    Accessory Motions:    Palpation   Min   Mod   Severe    Location: no TTP   Min   Mod   Severe    Location:    Min   Mod   Severe    Location:     Optional Tests:    Sensation Left Right Reflexes Left Right   Biceps (C5)   Biceps (C5)     Palmer Radial(C6-7)   Brachioradialis (C6)     Palmer Ulnar(C8-T1)   Triceps (C7)       Adson's Test   Pos    Neg Yergason's Test  Pos    Neg  Roo's Test   Pos    Neg Gilchrist's Sign  Pos    Neg  Neer's Test   Pos    Neg Clunk Test   Pos    Neg  Hawkin's Test   Pos    Neg AC Joint   Pos    Neg  Speed's Test   Pos    Neg SC Joint   Pos    Neg  Empty Can   Pos    Neg Pectoral Tightness  Pos    Neg  Anterior Apprehension  Pos    Neg   Posterior Apprehension  Pos    Neg  (-) Spurling's bilat  (-) Compression  test bilat    Other Tests / Comments:    Full cans: 2# and 4# for 6 reps no pain or significant compensation  OH shelf reach: 6# 6 reps void of pain or significant compensation, fairly symmetrical to contralateral shoulder  Chair Dips: 10 reps void of pain or limitation with BUEs    Pain Level (0-10 scale) post treatment: 0    ASSESSMENT/Changes in Function: Per POC.       See Plan of Care    See progress note/recertification    See Discharge Summary         Progress towards goals / Updated goals:  Per POC.    PLAN    Upgrade activities as tolerated       Continue plan of care    Update interventions per flow sheet         Discharge due to:_No impairments or functional deficits noted during evaluation.     Other:_  Advised pt to continue with her physician prescribed HEP (reviewed with pt to ensure appropriateness) daily and to f/u with her referring physician and PT if dysfunction returns.    Darlis Loan, PT, DPT, ATC, CSCS 06/03/2016  8:38 AM

## 2016-06-03 NOTE — Progress Notes (Signed)
[facility] ??? [facility]  [address]  [place], [place] [id number]  [phone]  [phone] fax    Plan of Care/ Statement of Necessity for Physical Therapy Services    Patient name: Cynthia PorteousFrankie M Reininger Start of Care: 06/03/2016   Referral source: Cynthia BroomsBlasdell, Cynthia C, MD DOB: 10/20/1951    Medical Diagnosis: Left shoulder pain [M25.512]   Onset Date:1 month ago    Treatment Diagnosis: NA, no impairments or functional deficits present   Prior Hospitalization: see medical history Provider#: [id number]   Medications: Verified on Patient summary List    Comorbidities: 2x L/S fusions per pt report, R RTC repair 1999, arthritis R knee, DMII, HTN   Prior Level of Function: sedentary, but independent void of shoulder pain      The Plan of Care and following information is based on the information from the initial evaluation.  Assessment/ key information: Pt is a 65 year old female presenting with reports of L shoulder pain onset 1 month ago without specific reported MOI. She reports initially having pain limiting her ability to [facility], dress, perform household chores, and pushing up from her bed, however, she reports since having a cortizone injection 2 days ago that her pain has abolished. At this time, pt demonstrates no significant impairments of functional deficits demonstrating symmetrical or better L shoulder AROM/PROM, strength, and function as compared to the uninvolved side, only reporting discomfort with end range shoulder extension with overpressure, and (-) special testing. Pt has been instructed to continue her daily HEP as prescribed by her physician and to f/u with orthopaedics and PT if her dysfunction returns. DC from skilled PT at this time.    Evaluation Complexity History LOW Complexity : Zero comorbidities / personal factors that will impact the outcome / POC; Examination LOW Complexity : 1-2 Standardized tests and measures addressing body  structure, function, activity limitation and / or participation in recreation  ;Presentation LOW Complexity : Stable, uncomplicated  ;Clinical Decision Making MEDIUM Complexity : FOTO score of 26-74  Overall Complexity Rating: LOW   Problem List: other NA   Treatment Plan may include any combination of the following: Other: NA  Patient / Family readiness to learn indicated by: asking questions, trying to perform skills and interest  Persons(s) to be included in education: patient (P)  Barriers to Learning/Limitations: None  Patient Goal (s): NA  Patient Self Reported Health Status: fair  Rehabilitation Potential: NA    Short Term Goals: To be accomplished in NA   NA  Long Term Goals: To be accomplished in NA   NA  Frequency / Duration: Patient to be seen NA times per week for NA    Patient/ Caregiver education and instruction: Diagnosis, prognosis, self care     Plan of care has been reviewed with PTA    G-Codes (GP)     Carry  [phone] Current  CJ= 20-39%   [id number] Goal  CJ= 20-39%  [id number] D/Marsh  CJ= 20-39%    The severity rating is based on clinical judgment and the FOTO score.    Certification Period: 06/03/2016 - 06/03/2016  Darlis Loananiel M Janell Keeling, PT, DPT, ATC, CSCS 06/03/2016 9:15 AM    ________________________________________________________________________    I certify that the above Therapy Services are being furnished while the patient is under my care. I agree with the treatment plan and certify that this therapy is necessary.    Physician's Signature:____________________  Date:____________Time: _________  Please sign and return to [facility] ??? [facility]  [place] [address]  [phone]  [phone] fax

## 2016-06-09 ENCOUNTER — Encounter

## 2016-06-09 NOTE — Telephone Encounter (Signed)
PT [phone]).  WANTS TO KNOW THE RESULTS OF SLEEP STUDY DONE 05/16/16 @[facility] .

## 2016-06-09 NOTE — Telephone Encounter (Signed)
Opened in error.

## 2016-06-10 NOTE — Telephone Encounter (Signed)
LVM to schedule Titration

## 2016-06-14 NOTE — Telephone Encounter (Signed)
[name] called to speak with Dr. Wilford CornerArora ref the procedure Dr. Sheron Nightingaleopalli wants to perform.[name] stated Dr. Wilford CornerArora had not mentioned the procedure he discussed with her and she is uncomfortable with this procedure. Please call [name] back at [phone]

## 2016-06-14 NOTE — Telephone Encounter (Signed)
06/14/2016: Returned patient call, left message for patient to return call. J.Kemon Devincenzi, LPN [id number]

## 2016-06-14 NOTE — Telephone Encounter (Signed)
Called pt and informed Dr. Wilford CornerArora is out of the office for the week. Pt states she would really like to talk to Dr. Wilford CornerArora because she is not comfortable with the procedures that pain mgmt would like to do. Pt offered follow up appointment with Dr. Wilford CornerArora to discuss alternative treatement. Pt agreed and in process of transferring, call was disconnected. Called pt back, received voice mail, left message to return call to office to schedule f/u appointment with Dr. Wilford CornerArora.

## 2016-06-23 ENCOUNTER — Inpatient Hospital Stay: Payer: PRIVATE HEALTH INSURANCE | Primary: Family Medicine

## 2016-06-30 DIAGNOSIS — G4733 Obstructive sleep apnea (adult) (pediatric): Secondary | ICD-10-CM

## 2016-06-30 NOTE — Progress Notes (Signed)
??   Patient arrived for sleep study.  ?? Physician orders were reviewed.   ?? Patient education to include initiation of PAP therapy per protocol.  ?? Patient questions were answered.  ?? The patient demonstrated good understanding of the positive airway pressure device.

## 2016-07-01 ENCOUNTER — Inpatient Hospital Stay: Admit: 2016-07-01 | Payer: PRIVATE HEALTH INSURANCE | Attending: Pulmonary Disease | Primary: Family Medicine

## 2016-07-01 NOTE — Progress Notes (Signed)
?   Sleep study completed per physician order.   ? Patient discharged from sleep center.  ? Patient awake, alert and able to leave the facility under their own power.  ? Instructed to follow up with their sleep physician for results.

## 2016-07-02 ENCOUNTER — Inpatient Hospital Stay
Admit: 2016-07-02 | Discharge: 2016-07-02 | Disposition: A | Discharge status: Home / Self Care | Payer: PRIVATE HEALTH INSURANCE | Attending: Emergency Medicine

## 2016-07-02 DIAGNOSIS — M5442 Lumbago with sciatica, left side: Secondary | ICD-10-CM

## 2016-07-02 MED ORDER — CYCLOBENZAPRINE 5 MG TAB
5 mg | ORAL_TABLET | Freq: Every evening | ORAL | 0 refills | Status: AC
Start: 2016-07-02 — End: ?

## 2016-07-02 MED ORDER — HYDROCODONE-ACETAMINOPHEN 5 MG-325 MG TAB
5-325 mg | ORAL | Status: AC
Start: 2016-07-02 — End: 2016-07-02
  Administered 2016-07-02: 14:00:00 via ORAL

## 2016-07-02 MED FILL — HYDROCODONE-ACETAMINOPHEN 5 MG-325 MG TAB: 5-325 mg | ORAL | Qty: 1

## 2016-07-02 NOTE — ED Triage Notes (Signed)
Patient arrived via EMS from home c/o left flank pain with shooting pain down leg. Patient rates pain 10/10

## 2016-07-02 NOTE — ED Provider Notes (Signed)
HPI Comments: Cynthia Marsh is a 65 y.o. female that presents to the ED with a complaint of low back pain and sciatica x1 week.  Pt states that her issues with her back are chronic following multiple fusion surgeries a few years ago.  Pt states that she has seen her spine dr here last month and has a follow up again next month.  Pt denies bowel or bladder changes, paresthesias or anesthesia.  No other complaints at this time    Patient is a 65 y.o. female presenting with back pain.   Back Pain    Pertinent negatives include no chest pain.        Past Medical History:   Diagnosis Date   ??? Anxiety    ??? Asthma    ??? 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 Cardiac echocardiogram 11/07/2015    EF 60%.  No RWMA.  Gr 1 DDfx.  RVSP 30 mmHg.     ??? Cardiovascular lower extremity venous duplex 11/06/2015    No DVT bilaterally.   ??? Chronic kidney disease    ??? Depression    ??? Developmental delay    ??? Diabetes (HCC)    ??? Diabetes mellitus (HCC)    ??? History of nuclear stress test 03/2016    No ischemia, no infarct, EF 67%.  Low risk study   ??? Hypertension    ??? Insomnia    ??? Osteoporosis    ??? Pneumonia 2010   ??? Thromboembolus [facility])        Past Surgical History:   Procedure Laterality Date   ??? BREAST SURGERY PROCEDURE UNLISTED       Left cyst removed    ??? HAND/FINGER SURGERY UNLISTED     ??? HX BACK SURGERY  2012    L4/L5/S1 fused in [place]   ??? HX CYST INCISION AND DRAINAGE Left    ??? HX CYST REMOVAL      calf    ??? HX GYN      complete hyst    ??? HX GYN      tubal ligation   ??? HX HYSTERECTOMY     ??? HX KNEE ARTHROSCOPY     ??? HX OOPHORECTOMY     ??? HX ORTHOPAEDIC      Right shoulder rotator cuff    ??? HX ORTHOPAEDIC      Left Little toe hammer toe, left big toe bunion removed   ??? HX ORTHOPAEDIC      Right knee torn meniscus   ??? HX ORTHOPAEDIC      CTS bilateral , Left thumb trigger finger    ??? HX WRIST FRACTURE TX     ??? LAMINECTOMY,CERVICAL     ??? PR ANESTH,SURGERY OF SHOULDER           Family History:    Problem Relation Age of Onset   ??? Diabetes Mother    ??? Diabetes Brother    ??? Cancer Sister    ??? Cancer Maternal Grandmother    ??? Cancer Daughter        Social History     Social History   ??? Marital status: SINGLE     Spouse name: N/A   ??? Number of children: N/A   ??? Years of education: N/A     Occupational History   ??? Not on file.     Social History Main Topics   ??? Smoking status: Never Smoker   ??? Smokeless tobacco: Never  Used   ??? Alcohol use Yes      Comment: on occasion   ??? Drug use: No   ??? Sexual activity: Not on file     Other Topics Concern   ??? Not on file     Social History Narrative         ALLERGIES: Nuts [tree nut]; Almond; Carvedilol; Cozaar [losartan]; and Crestor [rosuvastatin]    Review of Systems   Respiratory: Negative for shortness of breath.    Cardiovascular: Negative for chest pain.   Musculoskeletal: Positive for back pain. Negative for myalgias and neck pain.   Skin: Negative for color change and wound.   All other systems reviewed and are negative.      Vitals:    07/02/16 0946 07/02/16 0952   BP: 140/84    Pulse: (!) 104    Resp: 16    Temp: 99 ??F (37.2 ??C)    SpO2: 98% 98%   Weight: 78.5 kg (173 lb)    Height: 5\' 4"  (1.626 m)             Physical Exam   Constitutional: She is oriented to person, place, and time. She appears well-developed and well-nourished.   HENT:   Head: Normocephalic and atraumatic.   Eyes: Conjunctivae are normal. Pupils are equal, round, and reactive to light.   Neck: Normal range of motion. Neck supple.   Cardiovascular: Normal rate, regular rhythm and normal heart sounds.    Pulmonary/Chest: Effort normal and breath sounds normal. No respiratory distress.   Musculoskeletal:        Lumbar back: She exhibits decreased range of motion, tenderness and spasm. She exhibits no bony tenderness, no swelling, no edema, no deformity, no laceration, no pain and normal pulse.        Back:    Neurological: She is alert and oriented to person, place, and time. She  has normal strength. GCS eye subscore is 4. GCS verbal subscore is 5. GCS motor subscore is 6.   Reflex Scores:       Achilles reflexes are 2+ on the right side and 2+ on the left side.  Skin: Skin is warm and dry.   Psychiatric: She has a normal mood and affect. Her behavior is normal.        MDM  Number of Diagnoses or Management Options  Chronic left-sided low back pain with left-sided sciatica:   Essential hypertension:   Type 2 diabetes mellitus without complication, with long-term current use of insulin [facility]):   Diagnosis management comments: Pt given Norco in ED for pain but pt states that will not touch her pain.    I have reassessed the patient and discussed their results and diagnosis. Pt will be discharged in stable condition. Patient is to return to emergency department if any new or worsening condition. Patient understands and verbalizes agreement with plan.      Impression: chronic back pain with sciatica    Plan: discharge home  Anti inflammatory medications and muscle relaxants  Hot packs as tolerated  Follow up with Spine Dr      Risk of Complications, Morbidity, and/or Mortality  Presenting problems: low  Diagnostic procedures: low  Management options: low    Patient Progress  Patient progress: stable    ED Course       Procedures             Vitals:  Patient Vitals for the past 12 hrs:   Temp Pulse Resp  BP SpO2   07/02/16 0952 - - - - 98 %   07/02/16 0946 99 ??F (37.2 ??C) (!) 104 16 140/84 98 %         Medications ordered:   Medications   HYDROcodone-acetaminophen (NORCO) 5-325 mg per tablet 1 Tab (1 Tab Oral Given 07/02/16 1015)         Lab findings:  No results found for this or any previous visit (from the past 12 hour(s)).        X-Ray, CT or other radiology findings or impressions:  No orders to display       Progress notes, Consult notes or additional Procedure notes:       Disposition:  Diagnosis: No diagnosis found.    Disposition: discharge    Follow-up Information     None            Patient's Medications   Start Taking    No medications on file   Continue Taking    ALPRAZOLAM (XANAX) 0.5 MG TABLET    TK 1 T PO BID.  MAXIMUM 2 TS D.    AMLODIPINE (NORVASC) 10 MG TABLET    Take 1 Tab by mouth daily.    AZELASTINE (ASTELIN) 137 MCG (0.1 %) NASAL SPRAY    1 Spray by Both Nostrils route two (2) times a day.    BUDESONIDE-FORMOTEROL (SYMBICORT) 80-4.5 MCG/ACTUATION HFAA INHALER    Take 2 Puffs by inhalation two (2) times a day.    BUSPIRONE (BUSPAR) 15 MG TABLET    TK 1 T PO TID    CYCLOBENZAPRINE (FLEXERIL) 10 MG TABLET    Take 1 Tab by mouth three (3) times daily as needed for Muscle Spasm(s).    DULOXETINE (CYMBALTA) 30 MG CAPSULE    Take 1 Cap by mouth daily.    FLUTICASONE (FLONASE) 50 MCG/ACTUATION NASAL SPRAY    2 Sprays by Both Nostrils route nightly.    FLUTICASONE (FLOVENT DISKUS) 100 MCG/ACTUATION DSDV    Take  by inhalation.    FREESTYLE LANCETS 28 GAUGE MISC    CHECK BLOOD SUGAR BID    FREESTYLE LITE STRIPS STRIP    USE TO CHECK BLOOD SUGAR BID    HYDRALAZINE (APRESOLINE) 25 MG TABLET    Take 25 mg by mouth two (2) times a day.    HYDROCHLOROTHIAZIDE (HYDRODIURIL) 25 MG TABLET    Take 1 Tab by mouth daily.    JANUMET 50-1,000 MG PER TABLET    TK 1 T PO BID. STOP TAKING METFORMIN.    MELOXICAM (MOBIC) 15 MG TABLET    Take 1 Tab by mouth daily.    MIRTAZAPINE (REMERON) 30 MG TABLET    Take 1 Tab by mouth daily.    MONTELUKAST (SINGULAIR) 10 MG TABLET    Take 1 Tab by mouth daily.    OMEPRAZOLE (PRILOSEC) 20 MG CAPSULE    Take 1 Cap by mouth daily.    OXYBUTYNIN CHLORIDE XL (DITROPAN XL) 10 MG CR TABLET    Take 1 Tab by mouth daily.    OXYCODONE-ACETAMINOPHEN (PERCOCET 10) 10-325 MG PER TABLET    Take  by mouth.    PRAVASTATIN (PRAVACHOL) 40 MG TABLET    Take 1 Tab by mouth daily.    PREGABALIN (LYRICA) 75 MG CAPSULE    Take 1 Cap by mouth three (3) times daily. Max Daily Amount: 225 mg.    TIOTROPIUM BROMIDE (SPIRIVA RESPIMAT) 1.25 MCG/ACTUATION INHALER    Take  2 Puffs  by inhalation daily.    TRIMETHOPRIM-POLYMYXIN B (POLYTRIM) OPHTHALMIC SOLUTION    Administer 1 Drop to both eyes every four (4) hours.    XARELTO 20 MG TAB TABLET    Take 1 Tab by mouth daily.    ZOLPIDEM (AMBIEN) 10 MG TABLET    Take 1 Tab by mouth nightly.   These Medications have changed    No medications on file   Stop Taking    No medications on file

## 2016-07-02 NOTE — ED Notes (Signed)
I have reviewed discharge instructions with the patient.  The patient verbalized understanding.

## 2016-07-03 ENCOUNTER — Emergency Department

## 2016-07-03 ENCOUNTER — Emergency Department: Admit: 2016-07-03 | Payer: PRIVATE HEALTH INSURANCE | Primary: Family Medicine

## 2016-07-03 ENCOUNTER — Inpatient Hospital Stay
Admit: 2016-07-03 | Discharge: 2016-07-03 | Disposition: A | Discharge status: Home / Self Care | Payer: PRIVATE HEALTH INSURANCE | Attending: Emergency Medicine

## 2016-07-03 DIAGNOSIS — N390 Urinary tract infection, site not specified: Secondary | ICD-10-CM

## 2016-07-03 LAB — METABOLIC PANEL, COMPREHENSIVE
A-G Ratio: 0.8 (ref 0.8–1.7)
ALT (SGPT): 27 U/L (ref 13–56)
AST (SGOT): 19 U/L (ref 15–37)
Albumin: 3.8 g/dL (ref 3.4–5.0)
Alk. phosphatase: 71 U/L (ref 45–117)
Anion gap: 7 mmol/L (ref 3.0–18)
BUN/Creatinine ratio: 18 (ref 12–20)
BUN: 14 MG/DL (ref 7.0–18)
Bilirubin, total: 0.6 MG/DL (ref 0.2–1.0)
CO2: 29 mmol/L (ref 21–32)
Calcium: 9.4 MG/DL (ref 8.5–10.1)
Chloride: 101 mmol/L (ref 100–108)
Creatinine: 0.78 MG/DL (ref 0.6–1.3)
GFR est AA: >60 mL/min/{1.73_m2} (ref >60)
GFR est non-AA: >60 mL/min/{1.73_m2} (ref >60)
Globulin: 4.5 g/dL — ABNORMAL HIGH (ref 2.0–4.0)
Glucose: 146 mg/dL — ABNORMAL HIGH (ref 74–99)
Potassium: 3.2 mmol/L — ABNORMAL LOW (ref 3.5–5.5)
Protein, total: 8.3 g/dL — ABNORMAL HIGH (ref 6.4–8.2)
Sodium: 137 mmol/L (ref 136–145)

## 2016-07-03 LAB — SED RATE (ESR): Sed rate, automated: 39 mm/hr — ABNORMAL HIGH (ref 0–30)

## 2016-07-03 LAB — URINALYSIS W/ RFLX MICROSCOPIC
Bilirubin: NEGATIVE
Blood: NEGATIVE
Glucose: NEGATIVE mg/dL
Ketone: 15 mg/dL — AB
Nitrites: NEGATIVE
Protein: 30 mg/dL — AB
Specific gravity: 1.02 (ref 1.005–1.030)
Urobilinogen: 1 EU/dL (ref 0.2–1.0)
pH (UA): 6.5 (ref 5.0–8.0)

## 2016-07-03 LAB — CBC WITH AUTOMATED DIFF
ABS. BASOPHILS: 0 10*3/uL (ref 0.0–0.1)
ABS. EOSINOPHILS: 0 10*3/uL (ref 0.0–0.4)
ABS. LYMPHOCYTES: 1.5 10*3/uL (ref 0.9–3.6)
ABS. MONOCYTES: 0.9 10*3/uL (ref 0.05–1.2)
ABS. NEUTROPHILS: 5.9 10*3/uL (ref 1.8–8.0)
BASOPHILS: 0 % (ref 0–2)
EOSINOPHILS: 0 % (ref 0–5)
HCT: 37.6 % (ref 35.0–45.0)
HGB: 12.2 g/dL (ref 12.0–16.0)
LYMPHOCYTES: 18 % — ABNORMAL LOW (ref 21–52)
MCH: 26.8 PG (ref 24.0–34.0)
MCHC: 32.4 g/dL (ref 31.0–37.0)
MCV: 82.5 FL (ref 74.0–97.0)
MONOCYTES: 10 % (ref 3–10)
MPV: 9.6 FL (ref 9.2–11.8)
NEUTROPHILS: 72 % (ref 40–73)
PLATELET: 348 10*3/uL (ref 135–420)
RBC: 4.56 M/uL (ref 4.20–5.30)
RDW: 15.2 % — ABNORMAL HIGH (ref 11.6–14.5)
WBC: 8.3 10*3/uL (ref 4.6–13.2)

## 2016-07-03 LAB — POC LACTIC ACID: Lactic Acid (POC): 0.9 mmol/L (ref 0.4–2.0)

## 2016-07-03 LAB — URINE MICROSCOPIC ONLY: RBC: NEGATIVE /hpf (ref 0–5)

## 2016-07-03 LAB — GLUCOSE, POC: Glucose (POC): 121 mg/dL — ABNORMAL HIGH (ref 70–110)

## 2016-07-03 MED ORDER — SODIUM CHLORIDE 0.9 % IJ SYRG
INTRAMUSCULAR | Status: DC | PRN
Start: 2016-07-03 — End: 2016-07-03

## 2016-07-03 MED ORDER — SODIUM CHLORIDE 0.9% BOLUS IV
0.9 % | Freq: Once | INTRAVENOUS | Status: AC
Start: 2016-07-03 — End: 2016-07-03
  Administered 2016-07-03: 16:00:00 via INTRAVENOUS

## 2016-07-03 MED ORDER — CIPROFLOXACIN 500 MG TAB
500 mg | ORAL_TABLET | Freq: Two times a day (BID) | ORAL | 0 refills | Status: DC
Start: 2016-07-03 — End: 2016-07-03

## 2016-07-03 MED ORDER — GADOBUTROL 7.5 MMOL/7.5 ML (1 MMOL/ML) IV SYRINGE
7.5 mmol/ mL (1 mmol/mL) | Freq: Once | INTRAVENOUS | Status: AC
Start: 2016-07-03 — End: 2016-07-03
  Administered 2016-07-03: 22:00:00 via INTRAVENOUS

## 2016-07-03 MED ORDER — LEVOFLOXACIN 750 MG TAB
750 mg | ORAL_TABLET | Freq: Every day | ORAL | 0 refills | Status: DC
Start: 2016-07-03 — End: 2016-07-12

## 2016-07-03 MED ORDER — HYDROMORPHONE (PF) 1 MG/ML IJ SOLN
1 mg/mL | Freq: Once | INTRAMUSCULAR | Status: AC
Start: 2016-07-03 — End: 2016-07-03
  Administered 2016-07-03: 21:00:00 via INTRAVENOUS

## 2016-07-03 MED ORDER — POTASSIUM CHLORIDE 20 MEQ ORAL PACKET FOR SOLUTION
20 mEq | ORAL | Status: AC
Start: 2016-07-03 — End: 2016-07-03
  Administered 2016-07-03: 20:00:00 via ORAL

## 2016-07-03 MED ORDER — HYDROMORPHONE (PF) 1 MG/ML IJ SOLN
1 mg/mL | Freq: Once | INTRAMUSCULAR | Status: AC
Start: 2016-07-03 — End: 2016-07-03
  Administered 2016-07-03: 16:00:00 via INTRAVENOUS

## 2016-07-03 MED ORDER — CIPROFLOXACIN IN D5W 400 MG/200 ML IV PIGGY BACK
400 mg/200 mL | INTRAVENOUS | Status: AC
Start: 2016-07-03 — End: 2016-07-03
  Administered 2016-07-03: 20:00:00 via INTRAVENOUS

## 2016-07-03 MED FILL — POTASSIUM CHLORIDE 20 MEQ ORAL PACKET FOR SOLUTION: 20 mEq | ORAL | Qty: 2

## 2016-07-03 MED FILL — CIPROFLOXACIN IN D5W 400 MG/200 ML IV PIGGY BACK: 400 mg/200 mL | INTRAVENOUS | Qty: 200

## 2016-07-03 MED FILL — SODIUM CHLORIDE 0.9 % IV: INTRAVENOUS | Qty: 2500

## 2016-07-03 MED FILL — HYDROMORPHONE (PF) 1 MG/ML IJ SOLN: 1 mg/mL | INTRAMUSCULAR | Qty: 1

## 2016-07-03 MED FILL — BD POSIFLUSH NORMAL SALINE 0.9 % INJECTION SYRINGE: INTRAMUSCULAR | Qty: 10

## 2016-07-03 MED FILL — GADAVIST 7.5 MMOL/7.5 ML (1 MMOL/ML) INTRAVENOUS SYRINGE: 7.5 mmol/ mL (1 mmol/mL) | INTRAVENOUS | Qty: 15

## 2016-07-03 NOTE — ED Notes (Signed)
Patient is in bed resting with eyes open , patient is alert and oriented at this time. Patient is stable on the monitor. Call bell is within reach. Patient has been updated on plan of care. Will continue to monitor at this time.

## 2016-07-03 NOTE — ED Notes (Signed)
I have reviewed discharge instructions with the patient.  The patient verbalized understanding.  Discharge medications reviewed with patient and appropriate educational materials and side effects teaching were provided.

## 2016-07-03 NOTE — ED Notes (Signed)
Patient has just returned back to the rom.

## 2016-07-03 NOTE — ED Triage Notes (Signed)
Pt stated she was discharged yesterday from the ER home with flexeril for lower back pain radiating to the left leg, pt aao*4, no s/s of cardiac or respiratory distress, no loss of bowel or bladder,

## 2016-07-03 NOTE — ED Notes (Signed)
Patient had 37 mL post void at this time.

## 2016-07-03 NOTE — ED Provider Notes (Signed)
HPI Comments: 11:21 AM  The patient is a 65 y.o. Female with a history of chronic back pain on pain management who presents with severe, constant left lower back pain radiating down her left leg which worsened 2 days ago. Pt also complains of suprapubic abdominal pain. She states that she has had similar sx in the past when she had a kidney stone, but she does not believe she has a kidney stone at this time. She denies numbness in lower extremities or rectum, V/D, general malaise, hemoptysis, rashes, sore throat, neck pain, CP, SOB and a hx of cancer. She does have a hx of HTN, DM and chronic lower back pain and has been undergoing pain management for the past 2 weeks. She states that she had 2 back surgeries in 2011 and 2013, and that she currently has MRI compatible hardware in her back.    She last had an MRI done on 08/2015.        PCP: Cynthia Plumb, MD       The history is provided by the patient. No language interpreter was used.        Past Medical History:   Diagnosis Date   ??? Anxiety    ??? Asthma    ??? 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 Cardiac echocardiogram 11/07/2015    EF 60%.  No RWMA.  Gr 1 DDfx.  RVSP 30 mmHg.     ??? Cardiovascular lower extremity venous duplex 11/06/2015    No DVT bilaterally.   ??? Chronic kidney disease    ??? Depression    ??? Developmental delay    ??? Diabetes ([place])    ??? Diabetes mellitus ([place])    ??? History of nuclear stress test 03/2016    No ischemia, no infarct, EF 67%.  Low risk study   ??? Hypertension    ??? Insomnia    ??? Osteoporosis    ??? Pneumonia 2010   ??? Thromboembolus [facility])        Past Surgical History:   Procedure Laterality Date   ??? BREAST SURGERY PROCEDURE UNLISTED       Left cyst removed    ??? HAND/FINGER SURGERY UNLISTED     ??? HX BACK SURGERY  2012    L4/L5/S1 fused in [place]   ??? HX CYST INCISION AND DRAINAGE Left    ??? HX CYST REMOVAL      calf    ??? HX GYN      complete hyst    ??? HX GYN      tubal ligation   ??? HX HYSTERECTOMY     ??? HX KNEE ARTHROSCOPY      ??? HX OOPHORECTOMY     ??? HX ORTHOPAEDIC      Right shoulder rotator cuff    ??? HX ORTHOPAEDIC      Left Little toe hammer toe, left big toe bunion removed   ??? HX ORTHOPAEDIC      Right knee torn meniscus   ??? HX ORTHOPAEDIC      CTS bilateral , Left thumb trigger finger    ??? HX WRIST FRACTURE TX     ??? LAMINECTOMY,CERVICAL     ??? PR ANESTH,SURGERY OF SHOULDER           Family History:   Problem Relation Age of Onset   ??? Diabetes Mother    ??? Diabetes Brother    ??? Cancer Sister    ??? Cancer Maternal Grandmother    ???  Cancer Daughter        Social History     Social History   ??? Marital status: SINGLE     Spouse name: N/A   ??? Number of children: N/A   ??? Years of education: N/A     Occupational History   ??? Not on file.     Social History Main Topics   ??? Smoking status: Never Smoker   ??? Smokeless tobacco: Never Used   ??? Alcohol use Yes      Comment: on occasion   ??? Drug use: No   ??? Sexual activity: Not on file     Other Topics Concern   ??? Not on file     Social History Narrative         ALLERGIES: Nuts [tree nut]; Almond; Carvedilol; Cozaar [losartan]; and Crestor [rosuvastatin]    Review of Systems   Constitutional: Negative for appetite change and chills.   HENT: Negative.  Negative for congestion, sore throat and trouble swallowing.    Eyes: Negative.  Negative for visual disturbance.   Respiratory: Negative.  Negative for cough, shortness of breath and wheezing.    Cardiovascular: Negative.  Negative for chest pain, palpitations and leg swelling.   Gastrointestinal: Positive for abdominal pain (Suprapubic ). Negative for blood in stool, diarrhea and vomiting.   Genitourinary: Negative.  Negative for difficulty urinating, dysuria, frequency and vaginal discharge.   Musculoskeletal: Positive for back pain (left lower back). Negative for myalgias and neck pain.        Positive for pain in her left leg   Skin: Negative.  Negative for rash and wound.   Neurological: Negative.  Negative for dizziness, syncope, speech  difficulty, weakness, light-headedness, numbness and headaches.   Psychiatric/Behavioral: Negative.  Negative for hallucinations, self-injury and suicidal ideas.   All other systems reviewed and are negative.      Vitals:    07/03/16 1111 07/03/16 1145 07/03/16 1635   BP: 122/86 120/89    Pulse: (!) 110 (!) 105 99   Resp: '18 20 19   '$ Temp: (!) 100.5 ??F (38.1 ??C)     SpO2: 94% 90% 96%            Physical Exam   Constitutional: She is oriented to person, place, and time. She appears well-developed and well-nourished. No distress.   HENT:   Head: Normocephalic and atraumatic.   Mouth/Throat: Oropharynx is clear and moist.   Dry mucous membranes   Eyes: Conjunctivae and EOM are normal. Pupils are equal, round, and reactive to light. No scleral icterus.   Neck: Trachea normal and normal range of motion. Neck supple. No JVD present. No thyromegaly present.   Cardiovascular: Regular rhythm, S1 normal and S2 normal.  Exam reveals no gallop and no friction rub.    No murmur heard.  Tachycardic   Pulmonary/Chest: Effort normal and breath sounds normal. No accessory muscle usage. No respiratory distress.   Abdominal: Soft. Normal appearance. She exhibits no distension. There is no tenderness. There is no rigidity, no rebound and no guarding.   Musculoskeletal: Normal range of motion. She exhibits tenderness. She exhibits no edema.   Tenderness to palpation through lower thoracic and entire lumbar and left lumbar paraspinal muscles.   Neurological: She is alert and oriented to person, place, and time. She has normal strength and normal reflexes. No cranial nerve deficit or sensory deficit. Coordination normal.   Normal LE strength and sensation   Skin: Skin is warm and intact.  No rash noted.   Psychiatric: She has a normal mood and affect. Her speech is normal and behavior is normal.   Vitals reviewed.       MDM  Number of Diagnoses or Management Options  Acute febrile illness:    Acute left-sided low back pain with left-sided sciatica:   CAP (community acquired pneumonia):   Urinary tract infection without hematuria, site unspecified:   Diagnosis management comments: Cynthia Marsh is a 65 y.o. Female coming in with husband for acute on chronic back pain. Low grade fever, but also with evidence of UTI. MRI done to r/o discitis, epidural abscess or other spinal infection. No evidence of spinal emergency, but infiltrate seen on MRI concerning for CAP. No risk factors for HCAP. Normal WBC, lactate and HR normalized, no concern for sepsis. Will start on Levaquin to cover for both CAP and UTI and will advise to follow up with PCP tomorrow or return immediately for worsening symptoms.     ED Course       Procedures      Vitals:  Patient Vitals for the past 12 hrs:   Temp Pulse Resp BP SpO2   07/03/16 1635 - 99 19 - 96 %   07/03/16 1145 - (!) 105 20 120/89 90 %   07/03/16 1111 (!) 100.5 ??F (38.1 ??C) (!) 110 18 122/86 94 %   Pulse ox reviewed    Medications Ordered:  Medications   sodium chloride (NS) flush 5-10 mL (not administered)   sodium chloride 0.9 % bolus infusion 2,355 mL (0 mL/kg ?? 78.5 kg IntraVENous IV Completed 07/03/16 1325)   HYDROmorphone (PF) (DILAUDID) injection 1 mg (1 mg IntraVENous Given 07/03/16 1140)   ciprofloxacin (CIPRO) 400 mg IVPB (premix) (0 mg IntraVENous IV Completed 07/03/16 1631)   potassium chloride (KLOR-CON) packet 40 mEq (40 mEq Oral Given 07/03/16 1530)   HYDROmorphone (PF) (DILAUDID) injection 1 mg (1 mg IntraVENous Given 07/03/16 1643)   gadobutrol (Gadavist) contrast solution 7.5 mL (7.5 mL IntraVENous Given 07/03/16 1750)         Lab Findings:  Recent Results (from the past 12 hour(s))   METABOLIC PANEL, COMPREHENSIVE    Collection Time: 07/03/16 11:23 AM   Result Value Ref Range    Sodium 137 136 - 145 mmol/L    Potassium 3.2 (L) 3.5 - 5.5 mmol/L    Chloride 101 100 - 108 mmol/L    CO2 29 21 - 32 mmol/L    Anion gap 7 3.0 - 18 mmol/L     Glucose 146 (H) 74 - 99 mg/dL    BUN 14 7.0 - 18 MG/DL    Creatinine 0.78 0.6 - 1.3 MG/DL    BUN/Creatinine ratio 18 12 - 20      GFR est AA >60 >60 ml/min/1.84m    GFR est non-AA >60 >60 ml/min/1.726m   Calcium 9.4 8.5 - 10.1 MG/DL    Bilirubin, total 0.6 0.2 - 1.0 MG/DL    ALT (SGPT) 27 13 - 56 U/L    AST (SGOT) 19 15 - 37 U/L    Alk. phosphatase 71 45 - 117 U/L    Protein, total 8.3 (H) 6.4 - 8.2 g/dL    Albumin 3.8 3.4 - 5.0 g/dL    Globulin 4.5 (H) 2.0 - 4.0 g/dL    A-G Ratio 0.8 0.8 - 1.7     CBC WITH AUTOMATED DIFF    Collection Time: 07/03/16 11:23 AM   Result Value Ref  Range    WBC 8.3 4.6 - 13.2 K/uL    RBC 4.56 4.20 - 5.30 M/uL    HGB 12.2 12.0 - 16.0 g/dL    HCT 37.6 35.0 - 45.0 %    MCV 82.5 74.0 - 97.0 FL    MCH 26.8 24.0 - 34.0 PG    MCHC 32.4 31.0 - 37.0 g/dL    RDW 15.2 (H) 11.6 - 14.5 %    PLATELET 348 135 - 420 K/uL    MPV 9.6 9.2 - 11.8 FL    NEUTROPHILS 72 40 - 73 %    LYMPHOCYTES 18 (L) 21 - 52 %    MONOCYTES 10 3 - 10 %    EOSINOPHILS 0 0 - 5 %    BASOPHILS 0 0 - 2 %    ABS. NEUTROPHILS 5.9 1.8 - 8.0 K/UL    ABS. LYMPHOCYTES 1.5 0.9 - 3.6 K/UL    ABS. MONOCYTES 0.9 0.05 - 1.2 K/UL    ABS. EOSINOPHILS 0.0 0.0 - 0.4 K/UL    ABS. BASOPHILS 0.0 0.0 - 0.1 K/UL    DF AUTOMATED     SED RATE (ESR)    Collection Time: 07/03/16 11:23 AM   Result Value Ref Range    Sed rate, automated 39 (H) 0 - 30 mm/hr   POC LACTIC ACID    Collection Time: 07/03/16 11:29 AM   Result Value Ref Range    Lactic Acid (POC) 0.9 0.4 - 2.0 mmol/L   EKG, 12 LEAD, INITIAL    Collection Time: 07/03/16 11:34 AM   Result Value Ref Range    Ventricular Rate 105 BPM    Atrial Rate 105 BPM    P-R Interval 140 ms    QRS Duration 72 ms    Q-T Interval 358 ms    QTC Calculation (Bezet) 473 ms    Calculated P Axis 45 degrees    Calculated R Axis 18 degrees    Calculated T Axis 42 degrees    Diagnosis       Sinus tachycardia  Biatrial enlargement  Nonspecific ST abnormality  Abnormal ECG  When compared with ECG of 23-Mar-2016 10:56,   Vent. rate has increased BY  36 BPM     GLUCOSE, POC    Collection Time: 07/03/16 12:56 PM   Result Value Ref Range    Glucose (POC) 121 (H) 70 - 110 mg/dL   URINALYSIS W/ RFLX MICROSCOPIC    Collection Time: 07/03/16  1:14 PM   Result Value Ref Range    Color YELLOW      Appearance CLOUDY      Specific gravity 1.020 1.005 - 1.030      pH (UA) 6.5 5.0 - 8.0      Protein 30 (A) NEG mg/dL    Glucose NEGATIVE  NEG mg/dL    Ketone 15 (A) NEG mg/dL    Bilirubin NEGATIVE  NEG      Blood NEGATIVE  NEG      Urobilinogen 1.0 0.2 - 1.0 EU/dL    Nitrites NEGATIVE  NEG      Leukocyte Esterase LARGE (A) NEG     URINE MICROSCOPIC ONLY    Collection Time: 07/03/16  1:14 PM   Result Value Ref Range    WBC TOO NUMEROUS TO COUNT 0 - 4 /hpf    RBC NEGATIVE  0 - 5 /hpf    Epithelial cells 2+ 0 - 5 /lpf    Bacteria 2+ (A) NEG /  hpf       EKG Interpretation by ED physician:  Rhythm: ST  Rate: 105 bpm  Interpretation: No Acute Ischemic Changes   EKG interpret by Evelena Leyden, MD 11:38 AM      X-ray, CT or radiology findings or impressions:  Mri Thorac Spine W Wo Cont    Result Date: 07/03/2016  MRI [facility] SPINE W WO CONT INDICATION:   pain, fever, remote surgical hx. Arrived in the emergency department today and yesterday for lower back pain and sciatica for one week, chronic following multiple fusion surgeries a few years ago. Decreased range of motion, tenderness and spasm at the lumbar back. Symptoms worse today. COMPARISON: CTA chest 05/16/2016 CONTRAST:    7.5 cc IV Gadavist. TECHNIQUE: Pre and postcontrast sagittal and axial imaging of the thoracic spine. FINDINGS: Partial imaging of the lungs demonstrates consolidation in the right lower lobe, and more mild in the left lower lobe. Remainder of the mediastinum and aortic appearance is stable. No hydronephrosis. No abnormality or enhancement seen in the thoracic spinal canal. No spondylolisthesis. Vertebral body heights are  maintained, without evidence of acute fracture. No suspicious bone lesions or definitive marrow enhancement abnormality. There is some paravertebral enhancement along T9 and T10 levels that could be attributed to prominent vasculature or adjacent pneumonia/pleural change. Disc space narrowing throughout the mid thoracic levels. No spinal stenosis. Neural foramen are widely patent. IMPRESSION: 1. Evidence of pneumonia in the right greater than left lower lobe. 2. Lower thoracic paravertebral enhancement felt to be related to vessels and abutting reactive changes. No acute thoracic spine findings. Follow-up CT imaging with contrast can be performed to exclude acute infectious paraspinal process here.    Mri Lumb Spine W Wo Cont    Result Date: 07/03/2016  MRI LUMB SPINE W WO CONT INDICATION:   pain, fever, remote surgical hx. Emergency department visit today and yesterday for worsening back pain and sciatica. Worsening symptomatology today. COMPARISON: Lumbar spine CT 02/04/2016 TECHNIQUE: MR imaging of the lumbar spine was performed with and without contrast.  7.5 cc IV Gadavist FINDINGS: L4-L5-S1 posterior fusion with bilateral pedicle screws and rods. Satisfactory alignment. Minimal anterolisthesis of L3 on L4. Vertebral body heights are maintained. No acute fracture. The T1 and T2 signal intensities of the surgical levels L4 and L5 are within normal limits, with postcontrast enhancement abnormalities potentially artifact related to altered fat saturation from hardware. Postsurgical collection within the laminectomy bed of L5, not rim enhancement, and may represent seroma. No acute retroperitoneal findings. Lower thoracic and lumbar spinal canal is without enhancement abnormality or critical spinal stenosis. Perineural cysts in the lower sacral canal. T12/L1:  The spinal canal and neuroforamina are widely patent. L1/2:  The spinal canal and neural foramina are widely patent.  L2/3:  Mild canal narrowing without critical stenosis. Neural foramen are patent. L3/4:  Mild central disc protrusion with mild canal stenosis. Contributing hypertrophy of the facet joints and ligamentum flavum. Neural foramen are widely patent. L4/5:  Fusion level. No spinal stenosis or abnormal canal enhancement. Neural foramen are widely patent. L5/S1:  Fusion level. No spinal stenosis. Neural foramen are patent, mildly narrowed bilaterally. Partly enhancing tissue surrounding the thecal sac within the spinal canal at this level may contribute to impingement of exiting L5 bilateral nerves. IMPRESSION: 1. Satisfactory appearance of the lumbosacral fusion. Partly enhancing fibrotic tissue in the L5 spinal canal may contribute to impingement of preemergent L5 nerves, with mild narrowing of the neural foramen. 2. Mild postsurgical collection  in the L5 laminectomy bed. Seroma favored. 3. Mild degenerative change, with mild L3/L4 canal stenosis.    Xr Chest Port    Result Date: 07/03/2016  Portable chest x-ray, upright AP view, time 1210 hours on 06/16/2016: INDICATION: Sepsis. History of diabetes, hypertension, and chronic kidney disease. COMPARISON STUDY: Chest x-ray on 03/18/2016. FINDINGS: There is moderate cardiomegaly. Pulmonary vascularity is not cephalized. In left lung base there are mild subsegmental atelectatic changes without confluent infiltrates. In right lung base there are segmental or subsegmental atelectatic changes, with or without infiltrates. Lungs are mildly hypoventilated. IMPRESSION: Moderate cardiomegaly or mild-to-moderate cardiomegaly without cardiac decompensation. Mild to moderate segmental or subsegmental atelectatic changes, with or without associated pneumonic infiltrates in the right lung base. Focal mild discoid atelectasis at left lung base.    Progress notes, consult notes, or additional procedure notes:  3:56 PM  Checked on pt. Her pain had improved with first dose of med, but has now  returned. I discussed results for UTI with pt. She agress to wait for MRI results    Patient was re-evaluated and resting comfortably in bed. HR normalized. Discussed all results and follow up with PCP tomorrow. Strict return precautions given and patient verbally states understanding of plan.    Diagnosis:   1. Urinary tract infection without hematuria, site unspecified    2. Acute left-sided low back pain with left-sided sciatica    3. Acute febrile illness    4. CAP (community acquired pneumonia)      Disposition: Discharged    Follow-up Information     Follow up With Details Comments [place], MD In 2 days  [place] [id number]  [phone]      [facility] EMERGENCY DEPT  As needed, If symptoms worsen [place] [id number]  [phone]          Patient's Medications   Start Taking    LEVOFLOXACIN (LEVAQUIN) 750 MG TABLET    Take 1 Tab by mouth daily for 10 days.   Continue Taking    ALPRAZOLAM (XANAX) 0.5 MG TABLET    TK 1 T PO BID.  MAXIMUM 2 TS D.    AMLODIPINE (NORVASC) 10 MG TABLET    Take 1 Tab by mouth daily.    AZELASTINE (ASTELIN) 137 MCG (0.1 %) NASAL SPRAY    1 Spray by Both Nostrils route two (2) times a day.    BUDESONIDE-FORMOTEROL (SYMBICORT) 80-4.5 MCG/ACTUATION HFAA INHALER    Take 2 Puffs by inhalation two (2) times a day.    BUSPIRONE (BUSPAR) 15 MG TABLET    TK 1 T PO TID    CYCLOBENZAPRINE (FLEXERIL) 5 MG TABLET    Take 1 Tab by mouth nightly.    DULOXETINE (CYMBALTA) 30 MG CAPSULE    Take 1 Cap by mouth daily.    FLUTICASONE (FLONASE) 50 MCG/ACTUATION NASAL SPRAY    2 Sprays by Both Nostrils route nightly.    FLUTICASONE (FLOVENT DISKUS) 100 MCG/ACTUATION DSDV    Take  by inhalation.    FREESTYLE LANCETS 28 GAUGE MISC    CHECK BLOOD SUGAR BID    FREESTYLE LITE STRIPS STRIP    USE TO CHECK BLOOD SUGAR BID    HYDRALAZINE (APRESOLINE) 25 MG TABLET    Take 25 mg by mouth two (2) times a day.     HYDROCHLOROTHIAZIDE (HYDRODIURIL) 25 MG TABLET    Take 1 Tab by  mouth daily.    JANUMET 50-1,000 MG PER TABLET    TK 1 T PO BID. STOP TAKING METFORMIN.    MELOXICAM (MOBIC) 15 MG TABLET    Take 1 Tab by mouth daily.    MIRTAZAPINE (REMERON) 30 MG TABLET    Take 1 Tab by mouth daily.    MONTELUKAST (SINGULAIR) 10 MG TABLET    Take 1 Tab by mouth daily.    OMEPRAZOLE (PRILOSEC) 20 MG CAPSULE    Take 1 Cap by mouth daily.    OXYBUTYNIN CHLORIDE XL (DITROPAN XL) 10 MG CR TABLET    Take 1 Tab by mouth daily.    OXYCODONE-ACETAMINOPHEN (PERCOCET 10) 10-325 MG PER TABLET    Take  by mouth.    PRAVASTATIN (PRAVACHOL) 40 MG TABLET    Take 1 Tab by mouth daily.    PREGABALIN (LYRICA) 75 MG CAPSULE    Take 1 Cap by mouth three (3) times daily. Max Daily Amount: 225 mg.    TIOTROPIUM BROMIDE (SPIRIVA RESPIMAT) 1.25 MCG/ACTUATION INHALER    Take 2 Puffs by inhalation daily.    TRIMETHOPRIM-POLYMYXIN B (POLYTRIM) OPHTHALMIC SOLUTION    Administer 1 Drop to both eyes every four (4) hours.    XARELTO 20 MG TAB TABLET    Take 1 Tab by mouth daily.    ZOLPIDEM (AMBIEN) 10 MG TABLET    Take 1 Tab by mouth nightly.   These Medications have changed    No medications on file   Stop Taking    No medications on file       Scribe Attestation      Cala Bradford acting as a scribe for and in the presence of Evelena Leyden, MD      July 03, 2016 at 11:21 AM    [place] for, and in the presence of, Evelena Leyden, MD 3:02 PM     Signed by: Candice Camp, Scribe, 07/03/16 3:02 PM       Provider Attestation:      I personally performed the services described in the documentation, reviewed the documentation, as recorded by the scribe in my presence, and it accurately and completely records my words and actions. July 03, 2016 at 1:59 PM - Evelena Leyden, MD

## 2016-07-03 NOTE — ED Notes (Cosign Needed)
I performed a brief evaluation, including history and physical, of the patient here in triage and I have determined that pt will need further treatment and evaluation from the main side ER physician.  I have placed initial orders to help in expediting patients care.     July 03, 2016 at 11:16 AM - Joanna Hews, PA        Visit Vitals   ??? BP 122/86 (BP 1 Location: Right arm, BP Patient Position: At rest)   ??? Pulse (!) 110   ??? Temp (!) 100.5 ??F (38.1 ??C)   ??? Resp 18   ??? SpO2 94%        Pt seen here yesterday for same. Feeling worse. Taking oxy and flexeril w/o relief. Diabetic, hx of kidney stones; febrile, tachy c/o back pain.

## 2016-07-04 ENCOUNTER — Encounter: Attending: Pulmonary Disease | Primary: Family Medicine

## 2016-07-04 LAB — EKG 12-LEAD
Atrial Rate: 105 {beats}/min
P Axis: 45 degrees
P-R Interval: 140 ms
Q-T Interval: 358 ms
QRS Duration: 72 ms
QTc Calculation (Bazett): 473 ms
R Axis: 18 degrees
T Axis: 42 degrees
Ventricular Rate: 105 {beats}/min

## 2016-07-04 LAB — CULTURE, URINE
Culture result:: 100000
Culture: 100000

## 2016-07-04 LAB — EKG, 12 LEAD, INITIAL
Atrial Rate: 105 {beats}/min
Calculated P Axis: 45 degrees
Calculated R Axis: 18 degrees
Calculated T Axis: 42 degrees
P-R Interval: 140 ms
Q-T Interval: 358 ms
QRS Duration: 72 ms
QTC Calculation (Bezet): 473 ms
Ventricular Rate: 105 {beats}/min

## 2016-07-05 ENCOUNTER — Emergency Department: Admit: 2016-07-05 | Payer: PRIVATE HEALTH INSURANCE | Primary: Family Medicine

## 2016-07-05 ENCOUNTER — Inpatient Hospital Stay
Admit: 2016-07-05 | Discharge: 2016-07-12 | Disposition: A | Discharge status: Home Health | Payer: PRIVATE HEALTH INSURANCE | Attending: Family Medicine | Admitting: Family Medicine

## 2016-07-05 DIAGNOSIS — J189 Pneumonia, unspecified organism: Secondary | ICD-10-CM

## 2016-07-05 LAB — CBC WITH AUTOMATED DIFF
ABS. BASOPHILS: 0 10*3/uL (ref 0.0–0.1)
ABS. EOSINOPHILS: 0 10*3/uL (ref 0.0–0.4)
ABS. LYMPHOCYTES: 1.3 10*3/uL (ref 0.9–3.6)
ABS. MONOCYTES: 1.4 10*3/uL — ABNORMAL HIGH (ref 0.05–1.2)
ABS. NEUTROPHILS: 6.4 10*3/uL (ref 1.8–8.0)
BASOPHILS: 0 % (ref 0–2)
EOSINOPHILS: 0 % (ref 0–5)
HCT: 33.2 % — ABNORMAL LOW (ref 35.0–45.0)
HGB: 10.7 g/dL — ABNORMAL LOW (ref 12.0–16.0)
LYMPHOCYTES: 15 % — ABNORMAL LOW (ref 21–52)
MCH: 26 PG (ref 24.0–34.0)
MCHC: 32.2 g/dL (ref 31.0–37.0)
MCV: 80.6 FL (ref 74.0–97.0)
MONOCYTES: 15 % — ABNORMAL HIGH (ref 3–10)
MPV: 9.5 FL (ref 9.2–11.8)
NEUTROPHILS: 70 % (ref 40–73)
PLATELET: 336 10*3/uL (ref 135–420)
RBC: 4.12 M/uL — ABNORMAL LOW (ref 4.20–5.30)
RDW: 15 % — ABNORMAL HIGH (ref 11.6–14.5)
WBC: 9.1 10*3/uL (ref 4.6–13.2)

## 2016-07-05 LAB — METABOLIC PANEL, BASIC
Anion gap: 11 mmol/L (ref 3.0–18)
BUN/Creatinine ratio: 19 (ref 12–20)
BUN: 15 MG/DL (ref 7.0–18)
CO2: 28 mmol/L (ref 21–32)
Calcium: 9.9 MG/DL (ref 8.5–10.1)
Chloride: 97 mmol/L — ABNORMAL LOW (ref 100–108)
Creatinine: 0.78 MG/DL (ref 0.6–1.3)
GFR est AA: >60 mL/min/{1.73_m2} (ref >60)
GFR est non-AA: >60 mL/min/{1.73_m2} (ref >60)
Glucose: 118 mg/dL — ABNORMAL HIGH (ref 74–99)
Potassium: 3 mmol/L — ABNORMAL LOW (ref 3.5–5.5)
Sodium: 136 mmol/L (ref 136–145)

## 2016-07-05 LAB — PROTHROMBIN TIME + INR
INR: 1.7 — ABNORMAL HIGH (ref 0.8–1.2)
Prothrombin time: 19.4 s — ABNORMAL HIGH (ref 11.5–15.2)

## 2016-07-05 LAB — CARDIAC PANEL,(CK, CKMB & TROPONIN)
CK - MB: 3.9 ng/ml — ABNORMAL HIGH (ref <3.6)
CK-MB Index: 0.3 % (ref 0.0–4.0)
CK: 1272 U/L — ABNORMAL HIGH (ref 26–192)
Troponin-I, QT: <0.02 NG/ML (ref 0.0–0.045)

## 2016-07-05 LAB — PTT: aPTT: 47.6 s — ABNORMAL HIGH (ref 23.0–36.4)

## 2016-07-05 LAB — POC LACTIC ACID: Lactic Acid (POC): 1 mmol/L (ref 0.4–2.0)

## 2016-07-05 LAB — MAGNESIUM: Magnesium: 2 mg/dL (ref 1.6–2.6)

## 2016-07-05 MED ORDER — IOPAMIDOL 76 % IV SOLN
370 mg iodine /mL (76 %) | Freq: Once | INTRAVENOUS | Status: AC
Start: 2016-07-05 — End: 2016-07-05
  Administered 2016-07-05: 23:00:00 via INTRAVENOUS

## 2016-07-05 MED ORDER — SODIUM CHLORIDE 0.9% BOLUS IV
0.9 % | Freq: Once | INTRAVENOUS | Status: AC
Start: 2016-07-05 — End: 2016-07-05
  Administered 2016-07-05: 22:00:00 via INTRAVENOUS

## 2016-07-05 MED ORDER — RIVAROXABAN 20 MG TAB
20 mg | Freq: Every day | ORAL | Status: DC
Start: 2016-07-05 — End: 2016-07-12
  Administered 2016-07-06 – 2016-07-12 (×7): via ORAL

## 2016-07-05 MED ORDER — POTASSIUM CHLORIDE 10 MEQ/100 ML IV PIGGY BACK
10 mEq/0 mL | INTRAVENOUS | Status: AC
Start: 2016-07-05 — End: 2016-07-05
  Administered 2016-07-05: via INTRAVENOUS

## 2016-07-05 MED ORDER — HYDROMORPHONE (PF) 2 MG/ML IJ SOLN
2 mg/mL | Freq: Once | INTRAMUSCULAR | Status: AC
Start: 2016-07-05 — End: 2016-07-05
  Administered 2016-07-06: via INTRAVENOUS

## 2016-07-05 MED ORDER — OXYBUTYNIN CHLORIDE SR 5 MG 24 HR TAB
5 mg | Freq: Every day | ORAL | Status: DC
Start: 2016-07-05 — End: 2016-07-12
  Administered 2016-07-06 – 2016-07-12 (×7): via ORAL

## 2016-07-05 MED ORDER — PREGABALIN 75 MG CAP
75 mg | Freq: Three times a day (TID) | ORAL | Status: DC
Start: 2016-07-05 — End: 2016-07-12
  Administered 2016-07-06 – 2016-07-12 (×20): via ORAL

## 2016-07-05 MED ORDER — DULOXETINE 30 MG CAP, DELAYED RELEASE
30 mg | Freq: Every day | ORAL | Status: DC
Start: 2016-07-05 — End: 2016-07-12
  Administered 2016-07-06 – 2016-07-12 (×7): via ORAL

## 2016-07-05 MED ORDER — POTASSIUM CHLORIDE 20 MEQ ORAL PACKET FOR SOLUTION
20 mEq | ORAL | Status: AC
Start: 2016-07-05 — End: 2016-07-05
  Administered 2016-07-05: 23:00:00 via ORAL

## 2016-07-05 MED ORDER — SODIUM CHLORIDE 0.9% BOLUS IV
0.9 % | Freq: Once | INTRAVENOUS | Status: AC
Start: 2016-07-05 — End: 2016-07-05
  Administered 2016-07-05: 23:00:00 via INTRAVENOUS

## 2016-07-05 MED ORDER — AZELASTINE 137 MCG NASAL SPRAY AEROSOL
137 mcg (0.1 %) | Freq: Two times a day (BID) | NASAL | Status: DC
Start: 2016-07-05 — End: 2016-07-12
  Administered 2016-07-06 – 2016-07-12 (×14): via NASAL

## 2016-07-05 MED ORDER — BUDESONIDE-FORMOTEROL HFA 80 MCG-4.5 MCG/ACTUATION AEROSOL INHALER
Freq: Two times a day (BID) | RESPIRATORY_TRACT | Status: DC
Start: 2016-07-05 — End: 2016-07-07
  Administered 2016-07-06 – 2016-07-07 (×4): via RESPIRATORY_TRACT

## 2016-07-05 MED ORDER — HYDRALAZINE 25 MG TAB
25 mg | Freq: Three times a day (TID) | ORAL | Status: DC | PRN
Start: 2016-07-05 — End: 2016-07-12

## 2016-07-05 MED ORDER — OXYCODONE-ACETAMINOPHEN 10 MG-325 MG TAB
10-325 mg | Freq: Four times a day (QID) | ORAL | Status: DC | PRN
Start: 2016-07-05 — End: 2016-07-12
  Administered 2016-07-06 – 2016-07-12 (×19): via ORAL

## 2016-07-05 MED ORDER — SODIUM CHLORIDE 0.9 % IV PIGGY BACK
3.375 gram | Freq: Four times a day (QID) | INTRAVENOUS | Status: DC
Start: 2016-07-05 — End: 2016-07-12
  Administered 2016-07-06 – 2016-07-12 (×27): via INTRAVENOUS

## 2016-07-05 MED ORDER — ZOLPIDEM 5 MG TAB
5 mg | Freq: Every evening | ORAL | Status: DC | PRN
Start: 2016-07-05 — End: 2016-07-12
  Administered 2016-07-07 – 2016-07-12 (×5): via ORAL

## 2016-07-05 MED FILL — SODIUM CHLORIDE 0.9 % IV: INTRAVENOUS | Qty: 1000

## 2016-07-05 MED FILL — ISOVUE-370  76 % INTRAVENOUS SOLUTION: 370 mg iodine /mL (76 %) | INTRAVENOUS | Qty: 100

## 2016-07-05 MED FILL — POTASSIUM CHLORIDE 20 MEQ ORAL PACKET FOR SOLUTION: 20 mEq | ORAL | Qty: 2

## 2016-07-05 MED FILL — POTASSIUM CHLORIDE 10 MEQ/100 ML IV PIGGY BACK: 10 mEq/0 mL | INTRAVENOUS | Qty: 100

## 2016-07-05 NOTE — ED Notes (Addendum)
TRANSFER - OUT REPORT:    Verbal report given to Jerrilyn Cairo, RN(name) on Cynthia Marsh  being transferred to 2S(unit) for routine progression of care       Report consisted of patient???s Situation, Background, Assessment and   Recommendations(SBAR).     Information from the following report(s) SBAR, ED Summary and Cardiac Rhythm Sinus Tach was reviewed with the receiving nurse.    Lines:   Peripheral IV 07/05/16 Right Hand (Active)   Site Assessment Clean, dry, & intact 07/05/2016  5:38 PM   Phlebitis Assessment 0 07/05/2016  5:38 PM   Infiltration Assessment 0 07/05/2016  5:38 PM   Dressing Status Clean, dry, & intact 07/05/2016  5:38 PM   Dressing Type Transparent;Tape 07/05/2016  5:38 PM   Hub Color/Line Status Pink;Patent;Flushed 07/05/2016  5:38 PM   Action Taken Blood drawn 07/05/2016  5:38 PM        Opportunity for questions and clarification was provided.      Patient transported with:   Monitor

## 2016-07-05 NOTE — ED Notes (Signed)
Patient C/O 10/10 mid sternal chest pain post CTA requiring contrast. EKG will be ordered and will notify provider of patient complaint.

## 2016-07-05 NOTE — ED Notes (Signed)
Bedside shift change report received from Lu'Shell Hayes,RN  Report included the following information ED Summary, MAR and Recent Results.

## 2016-07-05 NOTE — ED Notes (Signed)
2LNC applied due to patient 02 saturation decrease to 84%, patient also C/O SOB upon sitting upward.

## 2016-07-05 NOTE — ED Notes (Signed)
ADDENDUM      Patient's care was signed over to me at 1900 on 07/05/16. Patient's care signed over to me pending cta result and admission. Pt in bed, no distress, reports some central nonradiating chest tightness similar to pain she has had intermittently. ekg obtained and no acute change, repeat enzymes ordered and iv pain medication given with some relief. I have discussed results of work up with patient.  resp cx and abx ordered.     I have spoken with Dr Kenna Gilbert, about patient. He agrees with plan of admission.         IMPRESSION:   1. CAP (community acquired pneumonia)    2. Non-traumatic rhabdomyolysis    3. Hypokalemia        DISPO: Admitted    Gustavo Lah, MD

## 2016-07-05 NOTE — Progress Notes (Signed)
Delay in administration of potassium 10 mEq IV R/T no IV pump. Passed the potassium on to [employer]. Secretary called for IV pump

## 2016-07-05 NOTE — ED Triage Notes (Signed)
EMS reports SOB since Friday;Dx with double pneumonia/chest pain with non productive cough on Sunday; started Antibiotics on Monday

## 2016-07-05 NOTE — ED Provider Notes (Signed)
HPI Comments: 5:17 PM Cynthia Marsh is a 65 y.o. female with h/o CKD, Asthma, HTN, PE, and chronic back pain who presents to ED complaining of chest pain and SOB onset Sunday but worsened over the past 2 days. Patient was seen in the ED on Sunday and wasn't feeling well. She had a fever on Sunday as well as cough and dysuria and received and extensive workup including an MRI of the Thoracic and lumbar spine to rule out discitis/ epidural abscess. Has chronic Lumbar back pain and is on pain management. She also had a UTI and was given Levaquin and has been taking them as instructed. Today she is feeling "wiped out" and has chest pain that is worse with coughing. Denies burning during urination and swelling. No other concerns or symptoms at this time.      PCP: Lenn Sink, MD      The history is provided by the patient.        Past Medical History:   Diagnosis Date   ??? Anxiety    ??? Asthma    ??? 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 Cardiac echocardiogram 11/07/2015    EF 60%.  No RWMA.  Gr 1 DDfx.  RVSP 30 mmHg.     ??? Cardiovascular lower extremity venous duplex 11/06/2015    No DVT bilaterally.   ??? Chronic kidney disease    ??? Depression    ??? Developmental delay    ??? Diabetes (HCC)    ??? Diabetes mellitus (HCC)    ??? History of nuclear stress test 03/2016    No ischemia, no infarct, EF 67%.  Low risk study   ??? Hypertension    ??? Insomnia    ??? Osteoporosis    ??? Pneumonia 2010   ??? Thromboembolus [facility])        Past Surgical History:   Procedure Laterality Date   ??? BREAST SURGERY PROCEDURE UNLISTED       Left cyst removed    ??? HAND/FINGER SURGERY UNLISTED     ??? HX BACK SURGERY  2012    L4/L5/S1 fused in [place]   ??? HX CYST INCISION AND DRAINAGE Left    ??? HX CYST REMOVAL      calf    ??? HX GYN      complete hyst    ??? HX GYN      tubal ligation   ??? HX HYSTERECTOMY     ??? HX KNEE ARTHROSCOPY     ??? HX OOPHORECTOMY     ??? HX ORTHOPAEDIC      Right shoulder rotator cuff    ??? HX ORTHOPAEDIC       Left Little toe hammer toe, left big toe bunion removed   ??? HX ORTHOPAEDIC      Right knee torn meniscus   ??? HX ORTHOPAEDIC      CTS bilateral , Left thumb trigger finger    ??? HX WRIST FRACTURE TX     ??? LAMINECTOMY,CERVICAL     ??? PR ANESTH,SURGERY OF SHOULDER           Family History:   Problem Relation Age of Onset   ??? Diabetes Mother    ??? Diabetes Brother    ??? Cancer Sister    ??? Cancer Maternal Grandmother    ??? Cancer Daughter        Social History     Social History   ??? Marital status: SINGLE  Spouse name: N/A   ??? Number of children: N/A   ??? Years of education: N/A     Occupational History   ??? Not on file.     Social History Main Topics   ??? Smoking status: Never Smoker   ??? Smokeless tobacco: Never Used   ??? Alcohol use Yes      Comment: on occasion   ??? Drug use: No   ??? Sexual activity: Not on file     Other Topics Concern   ??? Not on file     Social History Narrative         ALLERGIES: Nuts [tree nut]; Almond; Carvedilol; Cozaar [losartan]; and Crestor [rosuvastatin]    Review of Systems   Constitutional: Negative.  Negative for appetite change, chills and fever.   HENT: Negative.  Negative for congestion, sore throat and trouble swallowing.    Eyes: Negative.  Negative for visual disturbance.   Respiratory: Positive for cough and shortness of breath. Negative for wheezing.    Cardiovascular: Positive for chest pain. Negative for palpitations and leg swelling.   Gastrointestinal: Negative.  Negative for abdominal pain, blood in stool, diarrhea and vomiting.   Genitourinary: Negative.  Negative for difficulty urinating, dysuria, frequency and vaginal discharge.   Musculoskeletal: Negative.  Negative for back pain and myalgias.   Skin: Negative.  Negative for rash and wound.   Neurological: Negative.  Negative for dizziness, syncope, speech difficulty, weakness, light-headedness, numbness and headaches.   Psychiatric/Behavioral: Negative.  Negative for hallucinations, self-injury and suicidal ideas.    All other systems reviewed and are negative.      Vitals:    07/05/16 1800 07/05/16 1815 07/05/16 1830 07/05/16 1845   BP: 112/77 106/75 118/79 116/83   Pulse: (!) 101 (!) 102 (!) 101 (!) 103   Resp: Temp:       SpO2: 96% 95% 95% 96%   Weight:       Height:                Physical Exam   Constitutional: She is oriented to person, place, and time. She appears well-developed and well-nourished. No distress.   HENT:   Head: Normocephalic and atraumatic.   Mouth/Throat: Oropharynx is clear and moist. Mucous membranes are dry.   Eyes: Conjunctivae and EOM are normal. Pupils are equal, round, and reactive to light. No scleral icterus.   Neck: Trachea normal and normal range of motion. Neck supple. No JVD present. No thyromegaly present.   Cardiovascular: Regular rhythm, S1 normal and S2 normal.  Tachycardia present.  Exam reveals no gallop and no friction rub.    No murmur heard.  Pulmonary/Chest: Effort normal. No accessory muscle usage. No respiratory distress. She has no wheezes. She has rales.   Coughing occasionally during exam. Scattering rales in lungs bilaterally. No wheezing   Abdominal: Soft. Normal appearance. She exhibits no distension. There is no tenderness. There is no rigidity, no rebound and no guarding.   Musculoskeletal: Normal range of motion. She exhibits no edema or tenderness.   Neurological: She is alert and oriented to person, place, and time. She has normal strength. No cranial nerve deficit or sensory deficit. Coordination normal.   Skin: Skin is warm and intact. No rash noted.   Psychiatric: She has a normal mood and affect. Her speech is normal and behavior is normal.   Vitals reviewed.       MDM  Number of Diagnoses or Management  Options  CAP (community acquired pneumonia):   Hypokalemia:   Non-traumatic rhabdomyolysis:   Diagnosis management comments: Cynthia Marsh is a 65 y.o. Female coming in with cough, CP and SOB. CXR consistent with BL CAP, patient  concerned about PE, will CTa and plan for rehydration and admission.    ED Course       Procedures  Vitals:  Patient Vitals for the past 12 hrs:   Temp Pulse Resp BP SpO2   07/05/16 1845 - (!) 103 18 116/83 96 %   07/05/16 1830 - (!) 101 27 118/79 95 %   07/05/16 1815 - (!) 102 27 106/75 95 %   07/05/16 1800 - (!) 101 21 112/77 96 %   07/05/16 1745 - (!) 103 27 115/74 92 %   07/05/16 1730 - (!) 105 18 106/66 92 %   07/05/16 1718 - (!) 107 14 - -   07/05/16 1717 - - - 110/78 -   07/05/16 1710 98.9 ??F (37.2 ??C) - - - -       Medications ordered:   Medications   potassium chloride 10 mEq in 100 ml IVPB (not administered)   sodium chloride 0.9 % bolus infusion 1,000 mL (0 mL IntraVENous IV Completed 07/05/16 1823)   potassium chloride (KLOR-CON) packet 40 mEq (40 mEq Oral Given 07/05/16 1845)   sodium chloride 0.9 % bolus infusion 1,000 mL (1,000 mL IntraVENous New Bag 07/05/16 1851)         Lab findings:  Recent Results (from the past 12 hour(s))   EKG, 12 LEAD, INITIAL    Collection Time: 07/05/16  5:25 PM   Result Value Ref Range    Ventricular Rate 105 BPM    Atrial Rate 105 BPM    P-R Interval 144 ms    QRS Duration 70 ms    Q-T Interval 348 ms    QTC Calculation (Bezet) 459 ms    Calculated P Axis 62 degrees    Calculated R Axis -6 degrees    Calculated T Axis 36 degrees    Diagnosis       Sinus tachycardia  Otherwise normal ECG  When compared with ECG of 03-Jul-2016 11:34,  No significant change was found     CBC WITH AUTOMATED DIFF    Collection Time: 07/05/16  5:30 PM   Result Value Ref Range    WBC 9.1 4.6 - 13.2 K/uL    RBC 4.12 (L) 4.20 - 5.30 M/uL    HGB 10.7 (L) 12.0 - 16.0 g/dL    HCT 16.1 (L) 09.6 - 45.0 %    MCV 80.6 74.0 - 97.0 FL    MCH 26.0 24.0 - 34.0 PG    MCHC 32.2 31.0 - 37.0 g/dL    RDW 04.5 (H) 40.9 - 14.5 %    PLATELET 336 135 - 420 K/uL    MPV 9.5 9.2 - 11.8 FL    NEUTROPHILS 70 40 - 73 %    LYMPHOCYTES 15 (L) 21 - 52 %    MONOCYTES 15 (H) 3 - 10 %    EOSINOPHILS 0 0 - 5 %    BASOPHILS 0 0 - 2 %     ABS. NEUTROPHILS 6.4 1.8 - 8.0 K/UL    ABS. LYMPHOCYTES 1.3 0.9 - 3.6 K/UL    ABS. MONOCYTES 1.4 (H) 0.05 - 1.2 K/UL    ABS. EOSINOPHILS 0.0 0.0 - 0.4 K/UL    ABS. BASOPHILS 0.0 0.0 - 0.1 K/UL  DF AUTOMATED     METABOLIC PANEL, BASIC    Collection Time: 07/05/16  5:30 PM   Result Value Ref Range    Sodium 136 136 - 145 mmol/L    Potassium 3.0 (L) 3.5 - 5.5 mmol/L    Chloride 97 (L) 100 - 108 mmol/L    CO2 28 21 - 32 mmol/L    Anion gap 11 3.0 - 18 mmol/L    Glucose 118 (H) 74 - 99 mg/dL    BUN 15 7.0 - 18 MG/DL    Creatinine 1.61 0.6 - 1.3 MG/DL    BUN/Creatinine ratio 19 12 - 20      GFR est AA >60 >60 ml/min/1.39m2    GFR est non-AA >60 >60 ml/min/1.32m2    Calcium 9.9 8.5 - 10.1 MG/DL   CARDIAC PANEL,(CK, CKMB & TROPONIN)    Collection Time: 07/05/16  5:30 PM   Result Value Ref Range    CK 1272 (H) 26 - 192 U/L    CK - MB 3.9 (H) <3.6 ng/ml    CK-MB Index 0.3 0.0 - 4.0 %    Troponin-I, Qt. <0.02 0.0 - 0.045 NG/ML   MAGNESIUM    Collection Time: 07/05/16  5:30 PM   Result Value Ref Range    Magnesium 2.0 1.6 - 2.6 mg/dL   POC LACTIC ACID    Collection Time: 07/05/16  5:39 PM   Result Value Ref Range    Lactic Acid (POC) 1.0 0.4 - 2.0 mmol/L       EKG interpretation by ED Physician:  17:30 AM Sinus Tachycardia at rate of 105 bpm. No acute ischemic changes.     X-Ray, CT or other radiology findings or impressions:  XR CHEST SNGL V    (Results Pending)   CTA CHEST W OR W WO CONT    (Results Pending)   CXR  Developing rt and lt lower lobe infiltrate.    Progress notes, Consult notes, and Reevaluation of patient:   Patient hemodynamically stable, will transfer care to Dr. Tami Lin pending CTA and admission.    Disposition:  Diagnosis:   1. CAP (community acquired pneumonia)    2. Non-traumatic rhabdomyolysis    3. Hypokalemia        Disposition: pending    Follow-up Information     None           Patient's Medications   Start Taking    No medications on file   Continue Taking     ALPRAZOLAM (XANAX) 0.5 MG TABLET    TK 1 T PO BID.  MAXIMUM 2 TS D.    AMLODIPINE (NORVASC) 10 MG TABLET    Take 1 Tab by mouth daily.    AZELASTINE (ASTELIN) 137 MCG (0.1 %) NASAL SPRAY    1 Spray by Both Nostrils route two (2) times a day.    BUDESONIDE-FORMOTEROL (SYMBICORT) 80-4.5 MCG/ACTUATION HFAA INHALER    Take 2 Puffs by inhalation two (2) times a day.    BUSPIRONE (BUSPAR) 15 MG TABLET    TK 1 T PO TID    CYCLOBENZAPRINE (FLEXERIL) 5 MG TABLET    Take 1 Tab by mouth nightly.    DULOXETINE (CYMBALTA) 30 MG CAPSULE    Take 1 Cap by mouth daily.    FLUTICASONE (FLONASE) 50 MCG/ACTUATION NASAL SPRAY    2 Sprays by Both Nostrils route nightly.    FLUTICASONE (FLOVENT DISKUS) 100 MCG/ACTUATION DSDV    Take  by inhalation.  FREESTYLE LANCETS 28 GAUGE MISC    CHECK BLOOD SUGAR BID    FREESTYLE LITE STRIPS STRIP    USE TO CHECK BLOOD SUGAR BID    HYDRALAZINE (APRESOLINE) 25 MG TABLET    Take 25 mg by mouth two (2) times a day.    HYDROCHLOROTHIAZIDE (HYDRODIURIL) 25 MG TABLET    Take 1 Tab by mouth daily.    JANUMET 50-1,000 MG PER TABLET    TK 1 T PO BID. STOP TAKING METFORMIN.    LEVOFLOXACIN (LEVAQUIN) 750 MG TABLET    Take 1 Tab by mouth daily for 10 days.    MELOXICAM (MOBIC) 15 MG TABLET    Take 1 Tab by mouth daily.    MIRTAZAPINE (REMERON) 30 MG TABLET    Take 1 Tab by mouth daily.    MONTELUKAST (SINGULAIR) 10 MG TABLET    Take 1 Tab by mouth daily.    OMEPRAZOLE (PRILOSEC) 20 MG CAPSULE    Take 1 Cap by mouth daily.    OXYBUTYNIN CHLORIDE XL (DITROPAN XL) 10 MG CR TABLET    Take 1 Tab by mouth daily.    OXYCODONE-ACETAMINOPHEN (PERCOCET 10) 10-325 MG PER TABLET    Take  by mouth.    PRAVASTATIN (PRAVACHOL) 40 MG TABLET    Take 1 Tab by mouth daily.    PREGABALIN (LYRICA) 75 MG CAPSULE    Take 1 Cap by mouth three (3) times daily. Max Daily Amount: 225 mg.    TIOTROPIUM BROMIDE (SPIRIVA RESPIMAT) 1.25 MCG/ACTUATION INHALER    Take 2 Puffs by inhalation daily.     TRIMETHOPRIM-POLYMYXIN B (POLYTRIM) OPHTHALMIC SOLUTION    Administer 1 Drop to both eyes every four (4) hours.    XARELTO 20 MG TAB TABLET    Take 1 Tab by mouth daily.    ZOLPIDEM (AMBIEN) 10 MG TABLET    Take 1 Tab by mouth nightly.   These Medications have changed    No medications on file   Stop Taking    No medications on file     Scribe Attestation  Mable Fill scribing for and in the presence of Garlon Hatchet, MD (07/05/16)      Physician Attestation  I personally performed the services described in this documentation, reviewed and edited the documentation which was dictated to the scribe in my presence, and it accurately records my words and actions.    Garlon Hatchet, MD (07/05/16)      Signed by: Mable Fill, Scribe, July 05, 2016 at 7:07 PM

## 2016-07-06 LAB — EKG 12-LEAD
Atrial Rate: 105 {beats}/min
Atrial Rate: 97 {beats}/min
Diagnosis: NORMAL
P Axis: 62 degrees
P Axis: 63 degrees
P-R Interval: 144 ms
P-R Interval: 140 ms
Q-T Interval: 348 ms
Q-T Interval: 382 ms
QRS Duration: 70 ms
QRS Duration: 70 ms
QTc Calculation (Bazett): 459 ms
QTc Calculation (Bazett): 485 ms
R Axis: -6 degrees
R Axis: 1 degrees
T Axis: 36 degrees
T Axis: 36 degrees
Ventricular Rate: 105 {beats}/min
Ventricular Rate: 97 {beats}/min

## 2016-07-06 LAB — ECHOCARDIOGRAM COMPLETE 2D W DOPPLER W COLOR: Left Ventricular Ejection Fraction: 60

## 2016-07-06 LAB — HEMOGLOBIN A1C WITH EAG
Est. average glucose: 169 mg/dL
Hemoglobin A1c: 7.5 % — ABNORMAL HIGH (ref 4.2–5.6)

## 2016-07-06 LAB — CBC WITH AUTOMATED DIFF
ABS. BASOPHILS: 0 10*3/uL (ref 0.0–0.1)
ABS. EOSINOPHILS: 0.1 10*3/uL (ref 0.0–0.4)
ABS. LYMPHOCYTES: 1.4 10*3/uL (ref 0.9–3.6)
ABS. MONOCYTES: 1.1 10*3/uL (ref 0.05–1.2)
ABS. NEUTROPHILS: 4.4 10*3/uL (ref 1.8–8.0)
BASOPHILS: 0 % (ref 0–2)
EOSINOPHILS: 1 % (ref 0–5)
HCT: 29.1 % — ABNORMAL LOW (ref 35.0–45.0)
HGB: 9.2 g/dL — ABNORMAL LOW (ref 12.0–16.0)
LYMPHOCYTES: 21 % (ref 21–52)
MCH: 26.1 PG (ref 24.0–34.0)
MCHC: 31.6 g/dL (ref 31.0–37.0)
MCV: 82.4 FL (ref 74.0–97.0)
MONOCYTES: 16 % — ABNORMAL HIGH (ref 3–10)
MPV: 9.9 FL (ref 9.2–11.8)
NEUTROPHILS: 62 % (ref 40–73)
PLATELET: 299 10*3/uL (ref 135–420)
RBC: 3.53 M/uL — ABNORMAL LOW (ref 4.20–5.30)
RDW: 15.5 % — ABNORMAL HIGH (ref 11.6–14.5)
WBC: 7 10*3/uL (ref 4.6–13.2)

## 2016-07-06 LAB — LIPID PANEL
CHOL/HDL Ratio: 5.4 — ABNORMAL HIGH (ref 0–5.0)
Cholesterol, total: 113 MG/DL (ref <200)
HDL Cholesterol: 21 MG/DL — ABNORMAL LOW (ref 40–60)
LDL, calculated: 62.2 MG/DL (ref 0–100)
Triglyceride: 149 MG/DL (ref <150)
VLDL, calculated: 29.8 MG/DL

## 2016-07-06 LAB — EKG, 12 LEAD, INITIAL
Atrial Rate: 105 {beats}/min
Atrial Rate: 97 {beats}/min
Calculated P Axis: 62 degrees
Calculated P Axis: 63 degrees
Calculated R Axis: -6 degrees
Calculated R Axis: 1 degrees
Calculated T Axis: 36 degrees
Calculated T Axis: 36 degrees
Diagnosis: NORMAL
P-R Interval: 144 ms
P-R Interval: 140 ms
Q-T Interval: 348 ms
Q-T Interval: 382 ms
QRS Duration: 70 ms
QRS Duration: 70 ms
QTC Calculation (Bezet): 459 ms
QTC Calculation (Bezet): 485 ms
Ventricular Rate: 105 {beats}/min
Ventricular Rate: 97 {beats}/min

## 2016-07-06 LAB — METABOLIC PANEL, COMPREHENSIVE
A-G Ratio: 0.6 — ABNORMAL LOW (ref 0.8–1.7)
ALT (SGPT): 26 U/L (ref 13–56)
AST (SGOT): 36 U/L (ref 15–37)
Albumin: 2.3 g/dL — ABNORMAL LOW (ref 3.4–5.0)
Alk. phosphatase: 59 U/L (ref 45–117)
Anion gap: 10 mmol/L (ref 3.0–18)
BUN/Creatinine ratio: 21 — ABNORMAL HIGH (ref 12–20)
BUN: 11 MG/DL (ref 7.0–18)
Bilirubin, total: 0.6 MG/DL (ref 0.2–1.0)
CO2: 25 mmol/L (ref 21–32)
Calcium: 8.3 MG/DL — ABNORMAL LOW (ref 8.5–10.1)
Chloride: 104 mmol/L (ref 100–108)
Creatinine: 0.52 MG/DL — ABNORMAL LOW (ref 0.6–1.3)
GFR est AA: >60 mL/min/{1.73_m2} (ref >60)
GFR est non-AA: >60 mL/min/{1.73_m2} (ref >60)
Globulin: 4.1 g/dL — ABNORMAL HIGH (ref 2.0–4.0)
Glucose: 113 mg/dL — ABNORMAL HIGH (ref 74–99)
Potassium: 3.2 mmol/L — ABNORMAL LOW (ref 3.5–5.5)
Protein, total: 6.4 g/dL (ref 6.4–8.2)
Sodium: 139 mmol/L (ref 136–145)

## 2016-07-06 LAB — URINALYSIS W/ RFLX MICROSCOPIC
Bilirubin: NEGATIVE
Blood: NEGATIVE
Glucose: NEGATIVE mg/dL
Ketone: 40 mg/dL — AB
Leukocyte Esterase: NEGATIVE
Nitrites: NEGATIVE
Specific gravity: >1.03 — ABNORMAL HIGH (ref 1.005–1.030)
Urobilinogen: 1 EU/dL (ref 0.2–1.0)
pH (UA): 6 (ref 5.0–8.0)

## 2016-07-06 LAB — GLUCOSE, POC
Glucose (POC): 132 mg/dL — ABNORMAL HIGH (ref 70–110)
Glucose (POC): 123 mg/dL — ABNORMAL HIGH (ref 70–110)
Glucose (POC): 185 mg/dL — ABNORMAL HIGH (ref 70–110)
Glucose (POC): 191 mg/dL — ABNORMAL HIGH (ref 70–110)

## 2016-07-06 LAB — CARDIAC PANEL,(CK, CKMB & TROPONIN)
CK - MB: 2.9 ng/ml (ref <3.6)
CK - MB: 2.3 ng/ml (ref <3.6)
CK-MB Index: 0.3 % (ref 0.0–4.0)
CK-MB Index: 0.3 % (ref 0.0–4.0)
CK: 909 U/L — ABNORMAL HIGH (ref 26–192)
CK: 867 U/L — ABNORMAL HIGH (ref 26–192)
Troponin-I, QT: <0.02 NG/ML (ref 0.0–0.045)
Troponin-I, QT: 0.02 NG/ML (ref 0.0–0.045)

## 2016-07-06 LAB — MAGNESIUM: Magnesium: 1.9 mg/dL (ref 1.6–2.6)

## 2016-07-06 LAB — URINE MICROSCOPIC ONLY
Bacteria: NEGATIVE /hpf
Epithelial cells: NEGATIVE /lpf (ref 0–5)
Hyaline cast: 0 /lpf (ref 0–2)
RBC: NEGATIVE /hpf (ref 0–5)
WBC: 0 /hpf (ref 0–4)

## 2016-07-06 LAB — TSH 3RD GENERATION: TSH: 1.34 u[IU]/mL (ref 0.36–3.74)

## 2016-07-06 LAB — STREP PNEUMO AG, URINE: Strep pneumo Ag, urine: NEGATIVE

## 2016-07-06 LAB — LEGIONELLA PNEUMOPHILA AG, URINE: Legionella Ag, urine: NEGATIVE

## 2016-07-06 MED ORDER — BUSPIRONE 5 MG TAB
5 mg | ORAL | Status: AC
Start: 2016-07-06 — End: 2016-07-05
  Administered 2016-07-06: 02:00:00 via ORAL

## 2016-07-06 MED ORDER — ALBUTEROL SULFATE 0.083 % (0.83 MG/ML) SOLN FOR INHALATION
2.5 mg /3 mL (0.083 %) | RESPIRATORY_TRACT | Status: DC
Start: 2016-07-06 — End: 2016-07-06
  Administered 2016-07-06 (×2): via RESPIRATORY_TRACT

## 2016-07-06 MED ORDER — VANCOMYCIN 1,000 MG IV SOLR
1000 mg | Freq: Two times a day (BID) | INTRAVENOUS | Status: DC
Start: 2016-07-06 — End: 2016-07-07
  Administered 2016-07-06 – 2016-07-07 (×3): via INTRAVENOUS

## 2016-07-06 MED ORDER — TIOTROPIUM BROMIDE 18 MCG CAPS WITH INHALATION DEVICE
18 mcg | Freq: Every day | RESPIRATORY_TRACT | Status: DC
Start: 2016-07-06 — End: 2016-07-06
  Administered 2016-07-06: 02:00:00 via RESPIRATORY_TRACT

## 2016-07-06 MED ORDER — PANTOPRAZOLE 40 MG TAB, DELAYED RELEASE
40 mg | Freq: Every day | ORAL | Status: DC
Start: 2016-07-06 — End: 2016-07-12
  Administered 2016-07-06 – 2016-07-12 (×7): via ORAL

## 2016-07-06 MED ORDER — VANCOMYCIN 10 GRAM IV SOLR
10 gram | Freq: Once | INTRAVENOUS | Status: AC
Start: 2016-07-06 — End: 2016-07-06
  Administered 2016-07-06: 02:00:00 via INTRAVENOUS

## 2016-07-06 MED ORDER — INSULIN LISPRO 100 UNIT/ML INJECTION
100 unit/mL | Freq: Four times a day (QID) | SUBCUTANEOUS | Status: DC
Start: 2016-07-06 — End: 2016-07-12
  Administered 2016-07-06 – 2016-07-09 (×8): via SUBCUTANEOUS

## 2016-07-06 MED ORDER — PHARMACY INFORMATION NOTE
Status: AC
Start: 2016-07-06 — End: 2016-07-07
  Administered 2016-07-07: 13:00:00

## 2016-07-06 MED ORDER — ALBUTEROL SULFATE 0.083 % (0.83 MG/ML) SOLN FOR INHALATION
2.5 mg /3 mL (0.083 %) | RESPIRATORY_TRACT | Status: DC | PRN
Start: 2016-07-06 — End: 2016-07-07

## 2016-07-06 MED ORDER — POTASSIUM CHLORIDE 20 MEQ ORAL PACKET FOR SOLUTION
20 mEq | ORAL | Status: AC
Start: 2016-07-06 — End: 2016-07-06
  Administered 2016-07-06: 15:00:00 via ORAL

## 2016-07-06 MED ORDER — TIOTROPIUM BROMIDE 18 MCG CAPS WITH INHALATION DEVICE
18 mcg | Freq: Every day | RESPIRATORY_TRACT | Status: DC
Start: 2016-07-06 — End: 2016-07-12
  Administered 2016-07-07 – 2016-07-12 (×7): via RESPIRATORY_TRACT

## 2016-07-06 MED ORDER — MONTELUKAST 10 MG TAB
10 mg | ORAL | Status: AC
Start: 2016-07-06 — End: 2016-07-05
  Administered 2016-07-06: 02:00:00 via ORAL

## 2016-07-06 MED FILL — LYRICA 75 MG CAPSULE: 75 mg | ORAL | Qty: 1

## 2016-07-06 MED FILL — OXYCODONE-ACETAMINOPHEN 10 MG-325 MG TAB: 10-325 mg | ORAL | Qty: 1

## 2016-07-06 MED FILL — INSULIN LISPRO 100 UNIT/ML INJECTION: 100 unit/mL | SUBCUTANEOUS | Qty: 1

## 2016-07-06 MED FILL — CYMBALTA 30 MG CAPSULE,DELAYED RELEASE: 30 mg | ORAL | Qty: 1

## 2016-07-06 MED FILL — PIPERACILLIN-TAZOBACTAM 3.375 GRAM IV SOLR: 3.375 gram | INTRAVENOUS | Qty: 3.38

## 2016-07-06 MED FILL — POTASSIUM CHLORIDE 20 MEQ ORAL PACKET FOR SOLUTION: 20 mEq | ORAL | Qty: 2

## 2016-07-06 MED FILL — OXYBUTYNIN CHLORIDE SR 5 MG 24 HR TAB: 5 mg | ORAL | Qty: 2

## 2016-07-06 MED FILL — SYMBICORT 80 MCG-4.5 MCG/ACTUATION HFA AEROSOL INHALER: RESPIRATORY_TRACT | Qty: 6.9

## 2016-07-06 MED FILL — DILAUDID (PF) 2 MG/ML INJECTION SOLUTION: 2 mg/mL | INTRAMUSCULAR | Qty: 1

## 2016-07-06 MED FILL — MONTELUKAST 10 MG TAB: 10 mg | ORAL | Qty: 1

## 2016-07-06 MED FILL — VANCOMYCIN 1,000 MG IV SOLR: 1000 mg | INTRAVENOUS | Qty: 1000

## 2016-07-06 MED FILL — ALBUTEROL SULFATE 0.083 % (0.83 MG/ML) SOLN FOR INHALATION: 2.5 mg /3 mL (0.083 %) | RESPIRATORY_TRACT | Qty: 1

## 2016-07-06 MED FILL — PANTOPRAZOLE 40 MG TAB, DELAYED RELEASE: 40 mg | ORAL | Qty: 1

## 2016-07-06 MED FILL — SPIRIVA WITH HANDIHALER 18 MCG AND INHALATION CAPSULES: 18 mcg | RESPIRATORY_TRACT | Qty: 5

## 2016-07-06 MED FILL — AZELASTINE 137 MCG NASAL SPRAY AEROSOL: 137 mcg (0.1 %) | NASAL | Qty: 1

## 2016-07-06 MED FILL — PHARMACY INFORMATION NOTE: Qty: 1

## 2016-07-06 MED FILL — BUSPIRONE 5 MG TAB: 5 mg | ORAL | Qty: 3

## 2016-07-06 MED FILL — XARELTO 20 MG TABLET: 20 mg | ORAL | Qty: 1

## 2016-07-06 NOTE — Consults (Signed)
[facility]  Pulmonary, Critical Care, and Sleep Medicine    Initial Patient Consult    Name: Cynthia Marsh MRN: [id number]   DOB: 1951/04/30 Hospital: [facility]   Date: 07/06/2016          Subjective:     This patient has been seen and evaluated at the request of Dr. Tyler Pita  for pneumonia. Patient is a 65 y.o. female who presented to the ED with complaints of shortness of breath and pneumonia and lower back pain who was admitted. Patient stated that she initially went to the ED on Saturday for her back pain and was sent home patient stated that she had continued back pain and also developed some shortness of breath when walking from the bedroom to the kitchen and initially attributed the shortness of breath to the heat and humidity outside. Patient stated that the shortness of breath started about 2 weeks prior today, but became worse on Sunday. She went back to the ED where an MRI of her back was done to look for any infectious spinal process. It was negative for a spinal process how ever Right sided pneumonia was diagnosed and the patient was sent home on Levaquin for 10 days starting on 7/30. Patient stated that she a few day later went to her PCP office and was instructed to go to the ED if symptoms worsened in which her shortness of breath and chest pain did. CTA was done negative for PE but showed pneumonia and some small pleural effusions, Patient was admitted. Started on Zosyn and Vancomycin IV for Pneumonia, Echo done. Patient denies having fever and chills, diarrhea, vomiting, constipation, dizziness, abdominal pain and headache. Denies COPD and sleep apnea. Patient states that she lives with her husband who is disabled and has COPD.        Past Medical History:   Diagnosis Date   ??? Anxiety    ??? Asthma    ??? 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 Cardiac echocardiogram 11/07/2015    EF 60%.  No RWMA.  Gr 1 DDfx.  RVSP 30 mmHg.     ??? Cardiovascular  lower extremity venous duplex 11/06/2015    No DVT bilaterally.   ??? Chronic kidney disease    ??? Depression    ??? Developmental delay    ??? Diabetes ([place])    ??? Diabetes mellitus ([place])    ??? History of nuclear stress test 03/2016    No ischemia, no infarct, EF 67%.  Low risk study   ??? Hypertension    ??? Insomnia    ??? Osteoporosis    ??? Pneumonia 2010   ??? Thromboembolus [facility])       Past Surgical History:   Procedure Laterality Date   ??? BREAST SURGERY PROCEDURE UNLISTED       Left cyst removed    ??? HAND/FINGER SURGERY UNLISTED     ??? HX BACK SURGERY  2012    L4/L5/S1 fused in [place]   ??? HX CYST INCISION AND DRAINAGE Left    ??? HX CYST REMOVAL      calf    ??? HX GYN      complete hyst    ??? HX GYN      tubal ligation   ??? HX HYSTERECTOMY     ??? HX KNEE ARTHROSCOPY     ??? HX OOPHORECTOMY     ??? HX ORTHOPAEDIC      Right shoulder rotator  cuff    ??? HX ORTHOPAEDIC      Left Little toe hammer toe, left big toe bunion removed   ??? HX ORTHOPAEDIC      Right knee torn meniscus   ??? HX ORTHOPAEDIC      CTS bilateral , Left thumb trigger finger    ??? HX WRIST FRACTURE TX     ??? LAMINECTOMY,CERVICAL     ??? PR ANESTH,SURGERY OF SHOULDER        Prior to Admission medications    Medication Sig Start Date End Date Taking? Authorizing Provider   levoFLOXacin (LEVAQUIN) 750 mg tablet Take 1 Tab by mouth daily for 10 days. 07/03/16 07/13/16  Evelena Leyden, MD   cyclobenzaprine (FLEXERIL) 5 mg tablet Take 1 Tab by mouth nightly. 07/02/16   Darren Doristine Bosworth, PA   oxyCODONE-acetaminophen (PERCOCET 10) 10-325 mg per tablet Take  by mouth.    Historical Provider   tiotropium bromide (SPIRIVA RESPIMAT) 1.25 mcg/actuation inhaler Take 2 Puffs by inhalation daily.    Historical Provider   fluticasone (FLOVENT DISKUS) 100 mcg/actuation dsdv Take  by inhalation.    Historical Provider   meloxicam (MOBIC) 15 mg tablet Take 1 Tab by mouth daily. 05/06/16   Rogelio Seen, PA-C   trimethoprim-polymyxin b (POLYTRIM) ophthalmic solution Administer 1 Drop to both eyes every four  (4) hours. 05/06/16   Hurman Horn, PA-C   hydrALAZINE (APRESOLINE) 25 mg tablet Take 25 mg by mouth two (2) times a day.    Historical Provider   budesonide-formoterol (SYMBICORT) 80-4.5 mcg/actuation HFAA inhaler Take 2 Puffs by inhalation two (2) times a day.    Historical Provider   ALPRAZolam (XANAX) 0.5 mg tablet TK 1 T PO BID.  MAXIMUM 2 TS D. 01/07/16   Historical Provider   FREESTYLE LITE STRIPS strip USE TO CHECK BLOOD SUGAR BID 03/08/16   Historical Provider   busPIRone (BUSPAR) 15 mg tablet TK 1 T PO TID 03/18/16   Historical Provider   FREESTYLE LANCETS 28 gauge misc CHECK BLOOD SUGAR BID 03/08/16   Historical Provider   JANUMET 50-1,000 mg per tablet TK 1 T PO BID. STOP TAKING METFORMIN. 03/17/16   Historical Provider   pregabalin (LYRICA) 75 mg capsule Take 1 Cap by mouth three (3) times daily. Max Daily Amount: 225 mg. 03/29/16   Orlena Sheldon, MD   XARELTO 20 mg tab tablet Take 1 Tab by mouth daily. 03/07/16   Historical Provider   amLODIPine (NORVASC) 10 mg tablet Take 1 Tab by mouth daily. 02/07/16   Historical Provider   azelastine (ASTELIN) 137 mcg (0.1 %) nasal spray 1 Spray by Both Nostrils route two (2) times a day. 01/06/16   Historical Provider   DULoxetine (CYMBALTA) 30 mg capsule Take 1 Cap by mouth daily. 03/09/16   Historical Provider   fluticasone (FLONASE) 50 mcg/actuation nasal spray 2 Sprays by Both Nostrils route nightly. 02/16/16   Historical Provider   hydroCHLOROthiazide (HYDRODIURIL) 25 mg tablet Take 1 Tab by mouth daily. 01/02/16   Historical Provider   mirtazapine (REMERON) 30 mg tablet Take 1 Tab by mouth daily. 03/09/16   Historical Provider   montelukast (SINGULAIR) 10 mg tablet Take 1 Tab by mouth daily. 02/16/16   Historical Provider   omeprazole (PRILOSEC) 20 mg capsule Take 1 Cap by mouth daily. 02/02/16   Historical Provider   oxybutynin chloride XL (DITROPAN XL) 10 mg CR tablet Take 1 Tab by mouth daily. 03/08/16   Historical  Provider   pravastatin (PRAVACHOL) 40 mg tablet Take 1 Tab by  mouth daily. 02/07/16   Historical Provider   zolpidem (AMBIEN) 10 mg tablet Take 1 Tab by mouth nightly. 03/09/16   Historical Provider     Allergies   Allergen Reactions   ??? 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   ??? Almond Other (comments)     Tongue itches   ??? Carvedilol Other (comments)   ??? Cozaar [Losartan] Cough   ??? Crestor [Rosuvastatin] Unknown (comments)     Heart beats fast      Social History   Substance Use Topics   ??? Smoking status: Never Smoker   ??? Smokeless tobacco: Never Used   ??? Alcohol use Yes      Comment: on occasion      Family History   Problem Relation Age of Onset   ??? Diabetes Mother    ??? Diabetes Brother    ??? Cancer Sister    ??? Cancer Maternal Grandmother    ??? Cancer Daughter       Current Facility-Administered Medications   Medication Dose Route Frequency   ??? albuterol (PROVENTIL VENTOLIN) nebulizer solution 2.5 mg  2.5 mg Nebulization Q4H RT   ??? [START ON 07/07/2016] Vancomycin Trough Due   1 Each Other Rx Dosing/Monitoring   ??? piperacillin-tazobactam (ZOSYN) 3.375 g in 0.9% sodium chloride (MBP/ADV) 100 mL MBP  3.375 g IntraVENous Q6H   ??? azelastine (ASTELIN) 147mg/spray nasal spray  1 Spray Both Nostrils BID   ??? budesonide-formoterol (SYMBICORT) 80-4.5 mcg inhaler  2 Puff Inhalation BID RT   ??? DULoxetine (CYMBALTA) capsule 30 mg  30 mg Oral DAILY   ??? oxybutynin chloride XL (DITROPAN XL) tablet 10 mg  10 mg Oral DAILY   ??? pregabalin (LYRICA) capsule 75 mg  75 mg Oral TID   ??? rivaroxaban (XARELTO) tablet 20 mg  20 mg Oral DAILY   ??? insulin lispro (HUMALOG) injection   SubCUTAneous AC&HS   ??? pantoprazole (PROTONIX) tablet 40 mg  40 mg Oral ACB   ??? vancomycin (VANCOCIN) 1,000 mg in 0.9% sodium chloride (MBP/ADV) 250 mL adv  1,000 mg IntraVENous Q12H       Review of Systems:  Pertinent items are noted in HPI.    Objective:   Vital Signs:    Visit Vitals   ??? BP 111/76 (BP 1 Location: Left arm, BP Patient Position: At rest)   ??? Pulse 94   ??? Temp 97.1 ??F (36.2 ??C)   ??? Resp 18   ???  Ht 5' 4"  (1.626 m)   ??? Wt 82.5 kg (181 lb 14.4 oz)   ??? SpO2 95%   ??? Breastfeeding No   ??? BMI 31.22 kg/m2       O2 Device: Nasal cannula   O2 Flow Rate (L/min): 2 l/min (increased to 3 LPM)   Temp (24hrs), Avg:97.7 ??F (36.5 ??C), Min:97.1 ??F (36.2 ??C), Max:98.9 ??F (37.2 ??C)       Intake/Output:   Last shift:      08/02 0701 - 08/02 1900  In: 360 [P.O.:360]  Out: 750 [Urine:750]  Last 3 shifts: 07/31 1901 - 08/02 0700  In: 200 [I.V.:200]  Out: 300 [Urine:300]    Intake/Output Summary (Last 24 hours) at 07/06/16 1628  Last data filed at 07/06/16 1507   Gross per 24 hour   Intake  560 ml   Output             1050 ml   Net             -490 ml      Physical Exam:   General:  Alert, cooperative, no distress, appears stated age.   Head:  Normocephalic, without obvious abnormality, atraumatic.   Eyes:  Conjunctivae/corneas clear. PERRL, EOMs intact.   Nose: Nares normal. Mucosa normal. No drainage or sinus tenderness.   Throat: Lips, mucosa, and tongue normal. Teeth and gums normal.   Neck: Supple, symmetrical, trachea midline, no adenopathy, thyroid: no enlargment/tenderness/nodules, no carotid bruit and no JVD.       Lungs:   Crackles and Diminished sounds in lower right base, no wheezing, rales. Good airway entry, no dyspnea.    Chest wall:  No tenderness or deformity.   Heart:  Regular rate and rhythm, S1, S2 normal, no murmur, click, rub or gallop.   Abdomen:   Soft, non-tender. Bowel sounds normal. No masses,  No organomegaly.   Extremities: Extremities normal, atraumatic, no cyanosis. + trace edema.   Pulses: 2+ and symmetric all extremities.   Skin: Skin color, texture, turgor normal. No rashes or lesions   Lymph nodes: Cervical, supraclavicular nodes- normal.   Neurologic: Grossly nonfocal     Data review:     Recent Results (from the past 24 hour(s))   EKG, 12 LEAD, INITIAL    Collection Time: 07/05/16  5:25 PM   Result Value Ref Range    Ventricular Rate 105 BPM    Atrial Rate 105 BPM    P-R Interval  144 ms    QRS Duration 70 ms    Q-T Interval 348 ms    QTC Calculation (Bezet) 459 ms    Calculated P Axis 62 degrees    Calculated R Axis -6 degrees    Calculated T Axis 36 degrees    Diagnosis       Sinus tachycardia  Otherwise normal ECG  When compared with ECG of 03-Jul-2016 11:34,  No significant change was found  Confirmed by Burnell Blanks MD [id number]) on 07/05/2016 8:30:23 PM     CBC WITH AUTOMATED DIFF    Collection Time: 07/05/16  5:30 PM   Result Value Ref Range    WBC 9.1 4.6 - 13.2 K/uL    RBC 4.12 (L) 4.20 - 5.30 M/uL    HGB 10.7 (L) 12.0 - 16.0 g/dL    HCT 33.2 (L) 35.0 - 45.0 %    MCV 80.6 74.0 - 97.0 FL    MCH 26.0 24.0 - 34.0 PG    MCHC 32.2 31.0 - 37.0 g/dL    RDW 15.0 (H) 11.6 - 14.5 %    PLATELET 336 135 - 420 K/uL    MPV 9.5 9.2 - 11.8 FL    NEUTROPHILS 70 40 - 73 %    LYMPHOCYTES 15 (L) 21 - 52 %    MONOCYTES 15 (H) 3 - 10 %    EOSINOPHILS 0 0 - 5 %    BASOPHILS 0 0 - 2 %    ABS. NEUTROPHILS 6.4 1.8 - 8.0 K/UL    ABS. LYMPHOCYTES 1.3 0.9 - 3.6 K/UL    ABS. MONOCYTES 1.4 (H) 0.05 - 1.2 K/UL    ABS. EOSINOPHILS 0.0 0.0 - 0.4 K/UL    ABS. BASOPHILS 0.0 0.0 - 0.1 K/UL    DF AUTOMATED     METABOLIC PANEL, BASIC  Collection Time: 07/05/16  5:30 PM   Result Value Ref Range    Sodium 136 136 - 145 mmol/L    Potassium 3.0 (L) 3.5 - 5.5 mmol/L    Chloride 97 (L) 100 - 108 mmol/L    CO2 28 21 - 32 mmol/L    Anion gap 11 3.0 - 18 mmol/L    Glucose 118 (H) 74 - 99 mg/dL    BUN 15 7.0 - 18 MG/DL    Creatinine 0.78 0.6 - 1.3 MG/DL    BUN/Creatinine ratio 19 12 - 20      GFR est AA >60 >60 ml/min/1.61m    GFR est non-AA >60 >60 ml/min/1.756m   Calcium 9.9 8.5 - 10.1 MG/DL   CARDIAC PANEL,(CK, CKMB & TROPONIN)    Collection Time: 07/05/16  5:30 PM   Result Value Ref Range    CK 1272 (H) 26 - 192 U/L    CK - MB 3.9 (H) <3.6 ng/ml    CK-MB Index 0.3 0.0 - 4.0 %    Troponin-I, Qt. <0.02 0.0 - 0.045 NG/ML   MAGNESIUM    Collection Time: 07/05/16  5:30 PM   Result Value Ref Range    Magnesium 2.0 1.6 - 2.6 mg/dL    PTT    Collection Time: 07/05/16  5:30 PM   Result Value Ref Range    aPTT 47.6 (H) 23.0 - 36.4 SEC   PROTHROMBIN TIME + INR    Collection Time: 07/05/16  5:30 PM   Result Value Ref Range    Prothrombin time 19.4 (H) 11.5 - 15.2 sec    INR 1.7 (H) 0.8 - 1.2     POC LACTIC ACID    Collection Time: 07/05/16  5:39 PM   Result Value Ref Range    Lactic Acid (POC) 1.0 0.4 - 2.0 mmol/L   EKG, 12 LEAD, INITIAL    Collection Time: 07/05/16  7:58 PM   Result Value Ref Range    Ventricular Rate 97 BPM    Atrial Rate 97 BPM    P-R Interval 140 ms    QRS Duration 70 ms    Q-T Interval 382 ms    QTC Calculation (Bezet) 485 ms    Calculated P Axis 63 degrees    Calculated R Axis 1 degrees    Calculated T Axis 36 degrees    Diagnosis       Normal sinus rhythm  Normal ECG  When compared with ECG of 05-Jul-2016 17:25,  No significant change was found  Confirmed by Hoffmier, ThMarcello MooresD ([id number]), editor BuBurton [phone]   07/06/2016 6:29:12 AM     URINALYSIS W/ RFLX MICROSCOPIC    Collection Time: 07/05/16  8:24 PM   Result Value Ref Range    Color YELLOW      Appearance CLEAR      Specific gravity >1.030 (H) 1.005 - 1.030    pH (UA) 6.0 5.0 - 8.0      Protein TRACE (A) NEG mg/dL    Glucose NEGATIVE  NEG mg/dL    Ketone 40 (A) NEG mg/dL    Bilirubin NEGATIVE  NEG      Blood NEGATIVE  NEG      Urobilinogen 1.0 0.2 - 1.0 EU/dL    Nitrites NEGATIVE  NEG      Leukocyte Esterase NEGATIVE  NEG     STREP PNEUMO AG, URINE    Collection Time: 07/05/16  8:24 PM   Result Value  Ref Range    Strep pneumo Ag, urine NEGATIVE  NEG     LEGIONELLA PNEUMOPHILA AG, URINE     Collection Time: 07/05/16  8:24 PM   Result Value Ref Range    Legionella Ag, urine NEGATIVE  NEG     CULTURE, URINE    Collection Time: 07/05/16  8:24 PM   Result Value Ref Range    Special Requests: NO SPECIAL REQUESTS      Culture result: NO GROWTH AFTER 13 HOURS     URINE MICROSCOPIC ONLY    Collection Time: 07/05/16  8:24 PM   Result Value Ref Range    WBC 0 to 2 0 - 4  /hpf    RBC NEGATIVE  0 - 5 /hpf    Epithelial cells NEGATIVE  0 - 5 /lpf    Bacteria NEGATIVE  NEG /hpf    Mucus 1+ (A) NEG /lpf    Hyaline cast 0 to 2 0 - 2 /lpf   GLUCOSE, POC    Collection Time: 07/05/16  8:29 PM   Result Value Ref Range    Glucose (POC) 132 (H) 70 - 110 mg/dL   CARDIAC PANEL,(CK, CKMB & TROPONIN)    Collection Time: 07/05/16 10:32 PM   Result Value Ref Range    CK 909 (H) 26 - 192 U/L    CK - MB 2.9 <3.6 ng/ml    CK-MB Index 0.3 0.0 - 4.0 %    Troponin-I, Qt. <0.02 0.0 - 0.045 NG/ML   TSH 3RD GENERATION    Collection Time: 07/05/16 10:32 PM   Result Value Ref Range    TSH 1.34 0.36 - 3.74 uIU/mL   HEMOGLOBIN A1C WITH EAG    Collection Time: 07/06/16  2:35 AM   Result Value Ref Range    Hemoglobin A1c 7.5 (H) 4.2 - 5.6 %    Est. average glucose 169 mg/dL   CARDIAC PANEL,(CK, CKMB & TROPONIN)    Collection Time: 07/06/16  2:36 AM   Result Value Ref Range    CK 867 (H) 26 - 192 U/L    CK - MB 2.3 <3.6 ng/ml    CK-MB Index 0.3 0.0 - 4.0 %    Troponin-I, Qt. 0.02 0.0 - 0.045 NG/ML   CBC WITH AUTOMATED DIFF    Collection Time: 07/06/16  2:36 AM   Result Value Ref Range    WBC 7.0 4.6 - 13.2 K/uL    RBC 3.53 (L) 4.20 - 5.30 M/uL    HGB 9.2 (L) 12.0 - 16.0 g/dL    HCT 29.1 (L) 35.0 - 45.0 %    MCV 82.4 74.0 - 97.0 FL    MCH 26.1 24.0 - 34.0 PG    MCHC 31.6 31.0 - 37.0 g/dL    RDW 15.5 (H) 11.6 - 14.5 %    PLATELET 299 135 - 420 K/uL    MPV 9.9 9.2 - 11.8 FL    NEUTROPHILS 62 40 - 73 %    LYMPHOCYTES 21 21 - 52 %    MONOCYTES 16 (H) 3 - 10 %    EOSINOPHILS 1 0 - 5 %    BASOPHILS 0 0 - 2 %    ABS. NEUTROPHILS 4.4 1.8 - 8.0 K/UL    ABS. LYMPHOCYTES 1.4 0.9 - 3.6 K/UL    ABS. MONOCYTES 1.1 0.05 - 1.2 K/UL    ABS. EOSINOPHILS 0.1 0.0 - 0.4 K/UL    ABS. BASOPHILS 0.0 0.0 - 0.1 K/UL  DF AUTOMATED     METABOLIC PANEL, COMPREHENSIVE    Collection Time: 07/06/16  2:36 AM   Result Value Ref Range    Sodium 139 136 - 145 mmol/L    Potassium 3.2 (L) 3.5 - 5.5 mmol/L    Chloride 104 100 - 108 mmol/L    CO2 25 21 -  32 mmol/L    Anion gap 10 3.0 - 18 mmol/L    Glucose 113 (H) 74 - 99 mg/dL    BUN 11 7.0 - 18 MG/DL    Creatinine 0.52 (L) 0.6 - 1.3 MG/DL    BUN/Creatinine ratio 21 (H) 12 - 20      GFR est AA >60 >60 ml/min/1.75m    GFR est non-AA >60 >60 ml/min/1.732m   Calcium 8.3 (L) 8.5 - 10.1 MG/DL    Bilirubin, total 0.6 0.2 - 1.0 MG/DL    ALT (SGPT) 26 13 - 56 U/L    AST (SGOT) 36 15 - 37 U/L    Alk. phosphatase 59 45 - 117 U/L    Protein, total 6.4 6.4 - 8.2 g/dL    Albumin 2.3 (L) 3.4 - 5.0 g/dL    Globulin 4.1 (H) 2.0 - 4.0 g/dL    A-G Ratio 0.6 (L) 0.8 - 1.7     MAGNESIUM    Collection Time: 07/06/16  2:36 AM   Result Value Ref Range    Magnesium 1.9 1.6 - 2.6 mg/dL   LIPID PANEL    Collection Time: 07/06/16  2:36 AM   Result Value Ref Range    LIPID PROFILE          Cholesterol, total 113 <200 MG/DL    Triglyceride 149 <150 MG/DL    HDL Cholesterol 21 (L) 40 - 60 MG/DL    LDL, calculated 62.2 0 - 100 MG/DL    VLDL, calculated 29.8 MG/DL    CHOL/HDL Ratio 5.4 (H) 0 - 5.0     GLUCOSE, POC    Collection Time: 07/06/16  8:37 AM   Result Value Ref Range    Glucose (POC) 123 (H) 70 - 110 mg/dL   GLUCOSE, POC    Collection Time: 07/06/16 10:53 AM   Result Value Ref Range    Glucose (POC) 185 (H) 70 - 110 mg/dL     Results for JAANDRIAN, URBACHMRN [id number] of 07/06/2016 20:52   Ref. Range 11/06/2015 10:32 11/06/2015 14:24 07/05/2016 17:30 07/05/2016 22:32 07/06/2016 02:36   CK Latest Ref Range: 26 - 192 U/L 46 39 1272 (H) 909 (H) 867 (H)     Results for JAERMIE, GLENDENNINGMRN [id number] of 07/06/2016 20:52   Ref. Range 02/02/2016 13:15 02/03/2016 06:22 07/03/2016 11:23 07/05/2016 17:30 07/06/2016 02:36   HGB Latest Ref Range: 12.0 - 16.0 g/dL 12.4 11.9 (L) 12.2 10.7 (L) 9.2 (L)       Imaging:  I have personally reviewed the patient???s radiographs and have reviewed the reports:  CXR Results  (Last 48 hours)               07/05/16 1725  XR CHEST SNGL V Final result    Narrative:  TWO VIEW CHEST RADIOGRAPH        CPT CODE: 7125366      INDICATION: Shortness of breath, pneumonia.       COMPARISON: 07/03/2016       FINDINGS:        Lungs are hypoinflated. Cardiomediastinal silhouette is unchanged. Pulmonary  vasculature is normal. There is increased linear densities at the right greater   than left lung bases. There may be small bilateral pleural effusions. Biapical   pleural scarring. No pneumothorax.       IMPRESSION:       Increased linear densities at the right greater than left lung bases with   suspected small pleural effusions. Morphology is more suggestive of subsegmental   atelectasis, though infection cannot be entirely excluded.             CT Results  (Last 48 hours)               07/05/16 1924  CTA CHEST W OR W WO CONT Final result    Narrative:  CT Chest Pulmonary       CPT CODE: [id number]       HISTORY: Chest pain and shortness of breath. History of PE.       COMPARISON: Chest 2 views 07/05/2016. CT chest pulmonary 05/16/2016.       TECHNIQUE: 2.5 millimeter contiguous axial images from the lung apices to the   level of the adrenal glands following timed contrast bolus for maximum   intravascular enhancement of the pulmonary arteries.  Images reviewed in soft   tissue and lung windows. Coronal and sagittal MIP reformations were performed   for better evaluation of tortuous branching pulmonary vessels. Isovue-370 100 mL   IV contrast.       FINDINGS:        Suboptimal contrast bolus timing. The main pulmonary artery and lobar branches   are fairly well opacified, but the segmental and distal branches are not as well   opacified with contrast. Within this limitation there is no definite finding of   an acute pulmonary embolus. No focal filling defect within the main pulmonary   arteries or lobar branches. Some heterogeneous contrast density within the right   upper lobe and right middle lobe segmental branches, but not occlusive.       Aorta is moderately well opacified, appears normal.       Small bilateral pleural effusions. Lobar  consolidation in the posterior right   lower lobe. Some subsegmental opacification in the right middle lobe. Some   linear opacities in the lingula and posterior left lower lobe likely   subsegmental atelectasis.       The heart is enlarged, similar to the prior exam. No pericardial effusion.       Small hiatal hernia. Visible portions of the liver and spleen appear normal.   Adrenals are not enlarged. Visible portions of the pancreas and kidneys   unremarkable.       IMPRESSION:       Less than optimal contrast bolus. Proximal pulmonary arteries are well   opacified, more distal vessels not as well opacified limiting evaluation for   pulmonary embolus beyond the lobar branches.       Within the limitations of the exam there is no finding of central pulmonary   embolus.       Lobar consolidation in the posterior right lower lobe consistent with pneumonia.   Right middle lobe opacity that may be atelectasis or infection.              ECHO:  Echo Results  (Last 48 hours)               07/06/16 1343  2D ECHO COMPLETE ADULT (TTE) W OR WO CONTR Final result    Narrative:  [facility]   [address] [place] [address]   [phone]       Transthoracic Echocardiogram       Patient: SYRINA WAKE   MRN: [id number]   [place] #: [id number]   DOB: 1951-05-29   Age: 5 years   Gender: Female   Height: 64 in   Weight: 180.6 lb   BSA: 1.88 m-sq   BP: 101 / 71 mmHg   Study date: 06-Jul-2016   Status: Routine   Location: [facility]   DMS ACC #: [id number]       Allergies: TREE NUT, ALMOND, CARVEDILOL, LOSARTAN, ROSUVASTATIN       Referring_Ordering Physician:  Donna Christen Hospitalist   Interpreting Group:  [facility]   Interpreting Physician:  Jun K. Boyd Kerbs, MD   Technologist:  Jackelyn Hoehn, RCS, RDCS       IMPRESSIONS:   A clinically significant myopathic process is ruled out. There was no   significant valvular heart disease.       SUMMARY:   Procedure information: This was a technically difficult study.        Left ventricle: Systolic function was normal by visual assessment.   Ejection fraction was estimated to be 60 %. No obvious wall motion   abnormalities identified in the views obtained.       Right ventricle: Systolic pressure was mildly increased. Estimated peak   pressure was 45 mmHg.       COMPARISONS:   Comparison was made with the previous study of 07-Nov-2015. Pulmonary   artery pressure has increased from 30 mmHg to 45 mmHg.       INDICATIONS: Abnormal cardiac markers.       HISTORY: Prior history: Risk factors: hypertension, diabetes, and CKD. PE.       PROCEDURE: This was a routine study. The study included complete 2D   imaging, M-mode, complete spectral Doppler, and color Doppler. The heart   rate was 96 bpm, at the start of the study. Systolic blood pressure was   101 mmHg, at the start of the study. Diastolic blood pressure was 71 mmHg,   at the start of the study. Images were obtained from the parasternal,   apical, and subcostal acoustic windows. Echocardiographic views were   limited by poor acoustic window availability. This was a technically   difficult study.       LEFT VENTRICLE: Size was normal. Systolic function was normal by visual   assessment. Ejection fraction was estimated to be 60 %. No obvious wall   motion abnormalities identified in the views obtained. Wall thickness was   normal. DOPPLER: Left ventricular diastolic function parameters were   normal for the patient's age.       RIGHT VENTRICLE: The size was normal. Systolic function was normal.   DOPPLER: Systolic pressure was mildly increased. Estimated peak pressure   was 45 mmHg.       LEFT ATRIUM: Size was normal. LA volume index was 22 ml/m-sq.       RIGHT ATRIUM: Size was normal.       MITRAL VALVE: There was annular calcification. Normal valve structure.   DOPPLER: There was no evidence for stenosis. There was no significant   regurgitation.       AORTIC VALVE: The valve was probably trileaflet. Leaflets exhibited good    mobility. DOPPLER: There was no stenosis. There was no significant   regurgitation.       TRICUSPID VALVE: Normal valve structure.  DOPPLER: There was no evidence   for tricuspid stenosis. There was mild regurgitation. Right atrial   pressure estimate: 3 mmHg.       PULMONIC VALVE: Not well visualized, but normal Doppler findings.       AORTA: The root exhibited normal size.       SYSTEMIC VEINS: IVC: The inferior vena cava was normal in size.   Respirophasic changes were normal.       PERICARDIUM: Insignificant pericardial effusion and/or pericardial fat was   present.       SYSTEM MEASUREMENT TABLES       2D   Ao Diam: 3.04 cm   IVSd: 0.97 cm   LVIDd: 4.03 cm   LVIDs: 2.45 cm   LVPWd: 1.01 cm   LAAs A2C: 16.29 cm2   LAAs A4C: 16.92 cm2   LAESV A-L A2C: 40.51 ml   LAESV A-L A4C: 41.65 ml   LAESV Index (A-L): 22.38 ml/m2   LAESV MOD A2C: 38.65 ml   LAESV MOD A4C: 39.84 ml   LAESV(A-L): 42.07 ml   LALs A2C: 5.56 cm   LALs A4C: 5.83 cm   LVOT Diam: 1.97 cm   RA Area: 13.39 cm2   RV Minor: 3.61 cm       CW   TR Vmax: 3.23 m/s   IVRT: 94.58 ms   TR maxPG: 41.81 mmHG       MM   Tapse: 2.07 cm       PW   LVOT VTI: 19.24 cm   LVOT Vmax: 1.14 m/s   LVOT Vmean: 0.79 m/s   MV A Vel: 1 m/s   MV Dec Slope: 4.98 m/s2   MV DecT: 191.67 ms   MV E Vel: 0.96 m/s   MV E/A Ratio: 0.95   E': 0.07 m/s   E/E': 14   LVOT maxPG: 5.18 mmHG   LVOT meanPG: 2.86 mmHG   LVSI Dopp: 31.33 ml/m2   LVSV Dopp: 58.89 ml   Lateral E': 0.1 m/s   Lateral E/E': 9.71       Prepared and E-signed by       Jun K. Boyd Kerbs, MD   Signed 06-Jul-2016 16:16:43                 IMPRESSION:   ?? Right lower lobe pneumonia- Probable CAP vs other (Strep Pneumo and Legionella Ag negative)- Failure of OP therapy with levaquin  ?? Asthma  ?? Cough  ?? Rhabdomyolosis- mild CPK downtrending  ?? Anemia Hb: 12-->9 : no evidence of acute bleeding on Xarelto  ?? Hypokalemia  ?? Hypoalbuminemia  Past Hx  ?? DM2  ?? HTN  ?? PE 2016 - on Xarelto  ?? Depression/Anxiety  ?? Chronic Lower Back  Pain - managed by pain clinic      RECOMMENDATIONS:   ?? Supplemental O2 3L at this time to keep sats > 94% Titrate as tolerated to RA as patient clinically improves.  ?? Patient started Levaquin on 7/30 but was d/c'd. Continue current IV abx Zosyn and Vancomycin for broad spectrum coverage. - Streamline based on cultures and/or at 72 hrs  ?? Obtain sputum sample and send to lab, will f/o.  ?? Monitor for leukocytosis and fever.  ?? Do not feel steroids are needed at this time. Patient was not wheezing on my exam. Consider if respiratory status worsen or wheezing develops.  ?? Broncho hygiene protocol, ICS to bedside  ?? Chest xray in the am. Phos in the am.   ??  Continue home inhalor medications, Cont Pulmicort and Spiriva, albuterol PRN for wheezing.   ?? Continue anticoagulation - Xarelto. - Trend H/H  ?? Glucose control per primary.  ?? Will follow, Thank You.            Meyer Russel, NP     Pulmonary Staff  I have independently evaluated the patient. I agree with the above evaluation , assessment and recommendations along with the following observations. The patient feels much better today. Her low back pain is much improved. Her pain/tenderness is over the lumbosacral region on palpation. No rashes.She had a better appetite today and was able to eat all her meals. She had subjective fevers and chills over the weekend but none today.  The patient also states that she feels she is breathing easier but does have anterior chest pain with cough. + sputum production. No hemoptysis. No wheezing on my exam.    Melissa Montane, DO  Pulmonary, Sleep and Critical Care Medicine

## 2016-07-06 NOTE — Other (Signed)
Diabetes Patient/Family Education Record    Factors That  May Influence Patients Ability  to Learn or  Comply With  Recommendations:       Language barrier       Cultural needs      Motivation       Cognitive limitation       Physical      Education       Physiological factors      Hearing/vision/speaking impairment      Religious beliefs       Financial factors     Other:     No factors identified at this time.     Person Instructed:      Patient      Family     Other- mr. brooks     Preference for Learning:      Verbal      Written     Demonstration     Level of Comprehension & Competence:      Good                                       Fair                                       Poor                               Needs Reinforcement     Teachback completed    Education Component:     Medication management,       Nutritional management including the role of carbohydrate intake     Exercise     Signs, symptoms, and treatment of hyperglycemia and hypoglycemia    Treatment of hyperglycemia and hypoglycemia     Importance of blood glucose monitoring and how to obtain a blood glucose meter      Instruction on use of blood glucose meter     Discuss the importance of HbA1C monitoring and provide patient with  results     Sick day guidelines     Proper use and disposal of lancets, needles, syringes or insulin pens (if appropriate)     Potential long-term complications (retinopathy, kidney disease, neuropathy, heart disease, stroke, vascular disease, foot care)    Provide emergency contact number and contact number for more information      Goal:  Patient/family will demonstrate understanding of Diabetes Self Management Skills by: (date) _8/9/17______  Plan for post-discharge education or self-management support:     Outpatient class schedule provided             Patient Declined     Scheduled for outpatient classes (date) _______       Francis Gaines, MS, RD, CDE  Pager: [phone]

## 2016-07-06 NOTE — Progress Notes (Signed)
Completed Echocardiogram. Report to follow. Patient to be transported back to room.    Erica Kellam, RCS, RDCS

## 2016-07-06 NOTE — H&P (Addendum)
History & Physical    Patient: Cynthia Marsh MRN: [id number]  CSN: [id number]    Date of Birth: Sep 23, 1951  Age: 65 y.o.  Sex: female      DOA: 07/05/2016    Chief Complaint:   Chief Complaint   Patient presents with   ??? Shortness of Breath   ??? Chest Pain          HPI:     65 y.o. female with h/o CKD, Asthma, HTN, PE 2016, and chronic back pain who presentedto ED after evaluation at our office yesterday  She has been  complaining of chest pain and SOB onset Sunday but worsened over the past 2 days. Pt was seen at the ER twice last week end diagnosed with pneumonia and UTI   Pt started on Levaquin.    Pt has h/o chronic back pain   Pt has been f/u spine center she had MRI of the Thoracic and lumbar spine to rule out discitis/ epidural abscess. Has chronic Lumbar back pain and is on pain management. MRI 07/03/16 thoracic spine showed pneumonia   1. Evidence of pneumonia in the right greater than left lower lobe.  2. Lower thoracic paravertebral enhancement felt to be related to vessels and  abutting reactive changes. No acute thoracic spine findings. Follow-up CT  imaging with contrast can be performed to exclude acute infectious paraspinal process here.     pt was c/o cough , fatigue at the office and has been feeling getting worse pt was sent back to ER WBC, CPK was elevated 1272 troponin I normal repeat CTA showed   Less than optimal contrast bolus. Proximal pulmonary arteries are well  opacified, more distal vessels not as well opacified limiting evaluation for  pulmonary embolus beyond the lobar branches.  ??  Within the limitations of the exam there is no finding of central pulmonary  embolus.  ??  Lobar consolidation in the posterior right lower lobe consistent with pneumonia.  Right middle lobe opacity that may be atelectasis or infection.   ??     Pt was started on zosyn and vancomycin urine stret AG and legionella negative . Pt is feeling better today still has some soreness in the chest from coughing      Pt has H/O chronic cough/ asthma   for 6 months had APPT with Dr Neale Burly this week but she couldn't make it  Past Medical History:   Diagnosis Date   ??? Anxiety    ??? Asthma    ??? 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 Cardiac echocardiogram 11/07/2015    EF 60%.  No RWMA.  Gr 1 DDfx.  RVSP 30 mmHg.     ??? Cardiovascular lower extremity venous duplex 11/06/2015    No DVT bilaterally.   ??? Chronic kidney disease    ??? Depression    ??? Developmental delay    ??? Diabetes (HCC)    ??? Diabetes mellitus (HCC)    ??? History of nuclear stress test 03/2016    No ischemia, no infarct, EF 67%.  Low risk study   ??? Hypertension    ??? Insomnia    ??? Osteoporosis    ??? Pneumonia 2010   ??? Thromboembolus [facility])        Past Surgical History:   Procedure Laterality Date   ??? BREAST SURGERY PROCEDURE UNLISTED       Left cyst removed    ??? HAND/FINGER SURGERY UNLISTED     ???  HX BACK SURGERY  2012    L4/L5/S1 fused in [place]   ??? HX CYST INCISION AND DRAINAGE Left    ??? HX CYST REMOVAL      calf    ??? HX GYN      complete hyst    ??? HX GYN      tubal ligation   ??? HX HYSTERECTOMY     ??? HX KNEE ARTHROSCOPY     ??? HX OOPHORECTOMY     ??? HX ORTHOPAEDIC      Right shoulder rotator cuff    ??? HX ORTHOPAEDIC      Left Little toe hammer toe, left big toe bunion removed   ??? HX ORTHOPAEDIC      Right knee torn meniscus   ??? HX ORTHOPAEDIC      CTS bilateral , Left thumb trigger finger    ??? HX WRIST FRACTURE TX     ??? LAMINECTOMY,CERVICAL     ??? PR ANESTH,SURGERY OF SHOULDER         Family History   Problem Relation Age of Onset   ??? Diabetes Mother    ??? Diabetes Brother    ??? Cancer Sister    ??? Cancer Maternal Grandmother    ??? Cancer Daughter        Social History     Social History   ??? Marital status: SINGLE     Spouse name: N/A   ??? Number of children: N/A   ??? Years of education: N/A     Social History Main Topics   ??? Smoking status: Never Smoker   ??? Smokeless tobacco: Never Used   ??? Alcohol use Yes      Comment: on occasion   ??? Drug use: No    ??? Sexual activity: Not Asked     Other Topics Concern   ??? None     Social History Narrative       Prior to Admission medications    Medication Sig Start Date End Date Taking? Authorizing Provider   levoFLOXacin (LEVAQUIN) 750 mg tablet Take 1 Tab by mouth daily for 10 days. 07/03/16 07/13/16  Garlon Hatchet, MD   cyclobenzaprine (FLEXERIL) 5 mg tablet Take 1 Tab by mouth nightly. 07/02/16   Darren Anselm Pancoast, PA   oxyCODONE-acetaminophen (PERCOCET 10) 10-325 mg per tablet Take  by mouth.    Historical Provider   tiotropium bromide (SPIRIVA RESPIMAT) 1.25 mcg/actuation inhaler Take 2 Puffs by inhalation daily.    Historical Provider   fluticasone (FLOVENT DISKUS) 100 mcg/actuation dsdv Take  by inhalation.    Historical Provider   meloxicam (MOBIC) 15 mg tablet Take 1 Tab by mouth daily. 05/06/16   Sharen Hones, PA-C   trimethoprim-polymyxin b (POLYTRIM) ophthalmic solution Administer 1 Drop to both eyes every four (4) hours. 05/06/16   Earl Gala, PA-C   hydrALAZINE (APRESOLINE) 25 mg tablet Take 25 mg by mouth two (2) times a day.    Historical Provider   budesonide-formoterol (SYMBICORT) 80-4.5 mcg/actuation HFAA inhaler Take 2 Puffs by inhalation two (2) times a day.    Historical Provider   ALPRAZolam (XANAX) 0.5 mg tablet TK 1 T PO BID.  MAXIMUM 2 TS D. 01/07/16   Historical Provider   FREESTYLE LITE STRIPS strip USE TO CHECK BLOOD SUGAR BID 03/08/16   Historical Provider   busPIRone (BUSPAR) 15 mg tablet TK 1 T PO TID 03/18/16   Historical Provider   FREESTYLE LANCETS 28 gauge misc  CHECK BLOOD SUGAR BID 03/08/16   Historical Provider   JANUMET 50-1,000 mg per tablet TK 1 T PO BID. STOP TAKING METFORMIN. 03/17/16   Historical Provider   pregabalin (LYRICA) 75 mg capsule Take 1 Cap by mouth three (3) times daily. Max Daily Amount: 225 mg. 03/29/16   Harriett Rush, MD   XARELTO 20 mg tab tablet Take 1 Tab by mouth daily. 03/07/16   Historical Provider   amLODIPine (NORVASC) 10 mg tablet Take 1 Tab by mouth daily. 02/07/16    Historical Provider   azelastine (ASTELIN) 137 mcg (0.1 %) nasal spray 1 Spray by Both Nostrils route two (2) times a day. 01/06/16   Historical Provider   DULoxetine (CYMBALTA) 30 mg capsule Take 1 Cap by mouth daily. 03/09/16   Historical Provider   fluticasone (FLONASE) 50 mcg/actuation nasal spray 2 Sprays by Both Nostrils route nightly. 02/16/16   Historical Provider   hydroCHLOROthiazide (HYDRODIURIL) 25 mg tablet Take 1 Tab by mouth daily. 01/02/16   Historical Provider   mirtazapine (REMERON) 30 mg tablet Take 1 Tab by mouth daily. 03/09/16   Historical Provider   montelukast (SINGULAIR) 10 mg tablet Take 1 Tab by mouth daily. 02/16/16   Historical Provider   omeprazole (PRILOSEC) 20 mg capsule Take 1 Cap by mouth daily. 02/02/16   Historical Provider   oxybutynin chloride XL (DITROPAN XL) 10 mg CR tablet Take 1 Tab by mouth daily. 03/08/16   Historical Provider   pravastatin (PRAVACHOL) 40 mg tablet Take 1 Tab by mouth daily. 02/07/16   Historical Provider   zolpidem (AMBIEN) 10 mg tablet Take 1 Tab by mouth nightly. 03/09/16   Historical Provider       Allergies   Allergen Reactions   ??? 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   ??? Almond Other (comments)     Tongue itches   ??? Carvedilol Other (comments)   ??? Cozaar [Losartan] Cough   ??? Crestor [Rosuvastatin] Unknown (comments)     Heart beats fast       Review of Systems  GENERAL: Patient alert, awake and oriented times 3, able to communicate full sentences and not in distress.   HEENT: No change in vision, no earache, tinnitus, sore throat or sinus congestion.   NECK: No pain or stiffness.   CARDIOVASCULAR chest muscle pain or pressure. No palpitations.   PULMONARY: positive shortness of breath,  Dry cough no wheeze.   GASTROINTESTINAL: No abdominal pain, nausea, H/O reflux vomiting or diarrhea, melena or       bright red blood per rectum.   GENITOURINARY: No urinary frequency, urgency, hesitancy or dysuria.    MUSCULOSKELETAL:chronic knee pain and  back pain  DERMATOLOGIC: No rash, no itching, no lesions.   ENDOCRINE: No polyuria, polydipsia, no heat or cold intolerance. No recent change in    weight.   HEMATOLOGICAL: No anemia or easy bruising or bleeding.   NEUROLOGIC: No headache, seizures, numbness, generalized weakness  PSYCHIATRIC: No depression, H/O anxiety,  No significant mood disorder, no loss of interest in normal       activity or change in sleep pattern.        Physical Exam:     Physical Exam:  Visit Vitals   ??? BP 101/71 (BP 1 Location: Left arm, BP Patient Position: At rest)   ??? Pulse 89   ??? Temp 97.2 ??F (36.2 ??C)   ??? Resp 20   ??? Ht 5'  4" (1.626 m)   ??? Wt 82.5 kg (181 lb 14.4 oz)   ??? SpO2 95%   ??? Breastfeeding No   ??? BMI 31.22 kg/m2    O2 Flow Rate (L/min): 3 l/min O2 Device: Nasal cannula    Temp (24hrs), Avg:97.8 ??F (36.6 ??C), Min:97.2 ??F (36.2 ??C), Max:98.9 ??F (37.2 ??C)        07/31 1901 - 08/02 0700  In: 200 [I.V.:200]  Out: 300 [Urine:300]    General:  Alert, cooperative, no distress, appears stated age.   Head:  Normocephalic, without obvious abnormality, atraumatic.   Eyes:  Conjunctivae/corneas clear. PERRL, EOMs intact.   Nose: Nares normal. No drainage or sinus tenderness.   Throat: Lips, mucosa, and tongue normal. Teeth and gums normal.   Neck: Supple, symmetrical, trachea midline, no adenopathy, thyroid: no enlargement/tenderness/nodules, no carotid bruit and no JVD.   Back:   ROM normal. No CVA tenderness.   Lungs:   Crepitation Rt lower lung occasional expiratory wheezingf   Chest wall:  No tenderness or deformity.   Heart:  Regular rate and rhythm, S1, S2 normal, no murmur, click, rub or gallop.     Abdomen: Soft, non-tender. Bowel sounds normal. No masses,  No organomegaly.   Extremities: Extremities normal, atraumatic, no cyanosis or edema.   Pulses: 2+ and symmetric all extremities.   Skin: Skin color, texture, turgor normal. No rashes or lesions    Neurologic: CNII-XII intact. No focal motor or sensory deficit.       Labs Reviewed:    All lab results for the last 24 hours reviewed.  CT, CXR and EKG    Procedures/imaging: see electronic medical records for all procedures/Xrays and details which were not copied into this note but were reviewed prior to creation of Plan        Assessment/Plan     Active Problems:    Depression ()      Anxiety ()      Mild intermittent asthma without complication (04/20/2015)      Type 2 diabetes mellitus without complication (HCC) (04/20/2015)      Hx of spinal fusion (08/17/2015)      Dyslipidemia (04/27/2016)      Community acquired pneumonia (07/05/2016)      Rhabdomyolysis (07/05/2016)      Pneumonia (07/05/2016)         Plan:     Community Acquired pneumonia/ asthma   Zosyn and vancomycin  Check respiratory culture and blood culture is pending  Urine strept Ag and Legionella AG negative  Albuterol nebulizer q 4h prn  Swallow evaluation  Pulmonary consult    Ashma  Symbicort 2 puffs BID  Albuterol nebulizer q 4 h prn      Rhabdomyolysis   Improved with hydration in the ER  Hold Pravachol  Check lipid profile  Check echo    Hypokalemia  Potassium oral 40 meg x 1   Recheck lab in AM    UTI  Urine culture is pending    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

## 2016-07-06 NOTE — Progress Notes (Signed)
Problem: Dysphagia (Adult)  Goal: *Acute Goals and Plan of Care (Insert Text)  Rec:   Reg diet with thin liquids  Aspiration precautions  Oral care TID  Meds as tolerated  Outcome: Resolved/Met Date Met:  07/06/16  SPEECH LANGUAGE PATHOLOGY BEDSIDE SWALLOW EVALUATION/DISCHARGE     Patient: Cynthia Marsh (65 y.o. female)  Date: 07/06/2016  Primary Diagnosis: Pneumonia        Precautions: aspiration         ASSESSMENT :  Based on the objective data described below, the patient presents with oropharyngeal swallow fxn essentially [facility]. Strength, ROM, and coordination of the orofacial musculature were all found to be [facility]. All structures were intact and symmetrical. The pt presented with adequate oral transit times on all consistencies. Mastication time was appropriate. No s/sx of aspiration noted; hyolaryngeal elevation and excursion appeared adequate on all consistencies. No oral stasis noted post swallow. The pt denied globus sensation post swallow. Rec reg diet with thin liquids, aspiration precautions, oral care TID, and meds as tolerated. Please order MBS if silent aspiration is a concern. Otherwise, no further SLP services are indicated at this time. Please re-consult if needed.        PLAN :  Recommendations and Planned Interventions: See above  Frequency/Duration: Eval only  Discharge Recommendations: Outpatient and To Be Determined       SUBJECTIVE:   Patient stated ???I don't have any trouble???.      OBJECTIVE:       Past Medical History:   Diagnosis Date   ??? Anxiety     ??? Asthma     ??? Blood clot associated with vein wall inflammation     ??? Cardiac echocardiogram 11/07/2015     EF 60%.  No RWMA.  Gr 1 DDfx.  RVSP 30 mmHg.     ??? Cardiovascular lower extremity venous duplex 11/06/2015     No DVT bilaterally.   ??? Chronic kidney disease     ??? Depression     ??? Developmental delay     ??? Diabetes ([place])     ??? Diabetes mellitus ([place])     ??? History of nuclear stress test 03/2016      No ischemia, no infarct, EF 67%.  Low risk study   ??? Hypertension     ??? Insomnia     ??? Osteoporosis     ??? Pneumonia 2010   ??? Thromboembolus [facility])       Past Surgical History:   Procedure Laterality Date   ??? BREAST SURGERY PROCEDURE UNLISTED          Left cyst removed    ??? HAND/FINGER SURGERY UNLISTED       ??? HX BACK SURGERY   2012     L4/L5/S1 fused in [place]   ??? HX CYST INCISION AND DRAINAGE Left     ??? HX CYST REMOVAL         calf    ??? HX GYN         complete hyst    ??? HX GYN         tubal ligation   ??? HX HYSTERECTOMY       ??? HX KNEE ARTHROSCOPY       ??? HX OOPHORECTOMY       ??? HX ORTHOPAEDIC         Right shoulder rotator cuff    ??? HX ORTHOPAEDIC         Left Little toe hammer toe,  left big toe bunion removed   ??? HX ORTHOPAEDIC         Right knee torn meniscus   ??? HX ORTHOPAEDIC         CTS bilateral , Left thumb trigger finger    ??? HX WRIST FRACTURE TX       ??? LAMINECTOMY,CERVICAL       ??? PR ANESTH,SURGERY OF SHOULDER         Prior Level of Function/Home Situation: apartment  Home Situation  Home Environment: Apartment  # Steps to Enter: 1  One/Two Story Residence: One story  Living Alone: No  Support Systems: Family member(s)  Patient Expects to be Discharged to:: Apartment  Current DME Used/Available at Home: Gilford Rile, Environmental consultant, rollator  Diet prior to admission: reg with thin  Current Diet:  Reg with thin    Cognitive and Communication Status:  Neurologic State: Alert  Orientation Level: Oriented X4  Cognition: Follows commands   Oral Assessment:  Oral Assessment  Labial: No impairment  Dentition: Natural;Intact  Oral Hygiene: Adequate  Lingual: No impairment  Velum: No impairment  Mandible: No impairment  P.O. Trials:  Patient Position: [facility] at [facility]  Vocal quality prior to P.O.: No impairment  Consistency Presented: Thin liquid;Solid;Puree  How Presented: Self-fed/presented;Cup/sip;Spoon;Straw  Bolus Acceptance: No impairment  Bolus Formation/Control: No impairment  Propulsion: No impairment  Oral Residue: None   Initiation of Swallow: No impairment  Laryngeal Elevation: Functional  Aspiration Signs/Symptoms: None  Pharyngeal Phase Characteristics: No impairment, issues, or problems   Effective Modifications: None  Cues for Modifications: None        Oral Phase Severity: No impairment  Pharyngeal Phase Severity : No impairment     GCODESwallowing: G8996 Swallow Current Status CH= 0%  G8997 Swallow Goal Status CH= 0%  G8998 Swallow D/C Status CH= 0%     The severity rating is based on the following outcomes:             Clinical Judgment     Pain:  Pt c/o 0/10 pain prior to evaluation.  Pt c/o 0/10 pain post evaluation.     After treatment:   '[ ]'$             Patient left in no apparent distress sitting up in chair  '[X]'$             Patient left in no apparent distress in bed  '[X]'$             Call bell left within reach  '[X]'$             Nursing notified  '[ ]'$             Caregiver present  '[ ]'$             Bed alarm activated      COMMUNICATION/EDUCATION:   '[X]'$             SLP educated pt with regard to compensatory swallow strategies and                  aspiration/reflux precautions including: small bites/sips,                  alternate liquids/solids, decrease feeding rate, HOB > 45 with all po, and                             upright in bed at 30 degrees after po for at least  45 minutes.   '[X]'$             Patient/family have participated as able in goal setting and plan of care.     Thank you for this referral.     Eddie North, M.S. CCC-SLP/L  Speech-Language Pathologist

## 2016-07-06 NOTE — Progress Notes (Signed)
Problem: Mobility Impaired (Adult and Pediatric)  Goal: *Acute Goals and Plan of Care (Insert Text)  STG for 8.2.17 to be completed by 8.9.17 (7 days).  1. Pt will complete supine to/from sit with S in order to complete EOB activity.  2. Pt will complete sit to/from stand transfer with RW and S in prep for ambulation.  3. Pt will ambulate 100 feet with RW and min A in order to negotiate household distances.   Outcome: Progressing Towards Goal  PHYSICAL THERAPY EVALUATION     Patient: Cynthia Marsh (65 y.o. female)  Date: 07/06/2016  Primary Diagnosis: Pneumonia  Precautions: Fall         ASSESSMENT : 65 yo female presents with impaired functional mobility, decreased activity tolerance, and generalized weakness s/p pneumonia. Pt educated on role of therapy during acute stay, verbalized understanding. Pt reported no pain in back while supine, reported difficulty breathing. Spo2 on 2L NC resting supine: 91%. Pt educated on logroll technique to decrease back pain, pt verbalized understanding but demo poor compliance despite max VCs for technique. Pt completed full supine to sit with max A due to complaints of pain in back. Pt sat EOB only 15 seconds due to complaints of pain, returned supine. SpO2 on 2L NC after returning supine: 88%, increased to 90% within 30 seconds. Recommend continued therapy during acute stay.     Eval Complexity: History: MEDIUM  Complexity : 1-2 comorbidities / personal factors will impact the outcome/ POC Exam:LOW Complexity : 1-2 Standardized tests and measures addressing body structure, function, activity limitation and / or participation in recreation  Presentation: LOW Complexity : Stable, uncomplicated  Clinical Decision Making:Low Complexity back pain, SOB Overall Complexity:LOW      Patient will benefit from skilled intervention to address the above impairments.  Patient???s rehabilitation potential is considered to be Guarded   Factors which may influence rehabilitation potential include:   [ ]          None noted  [ ]          Mental ability/status  [X]          Medical condition  [X]          Home/family situation and support systems  [ ]          Safety awareness  [X]          Pain tolerance/management  [ ]          Other:        PLAN :Recommend continued therapy during acute stay.  Recommendations and Planned Interventions:  [X]            Bed Mobility Training             [X]     Neuromuscular Re-Education  [X]            Transfer Training                   [ ]     Orthotic/Prosthetic Training  [X]            Gait Training                          [ ]     Modalities  [X]            Therapeutic Exercises          [ ]     Edema Management/Control  [X]            Therapeutic Activities            [  X]    Patient and Family Training/Education  [ ]            Other (comment):     Frequency/Duration: Patient will be followed by physical therapy 3-5 times a week to address goals.  Discharge Recommendations: Skilled Nursing Facility  Further Equipment Recommendations for Discharge: To be determined at next level of care prior to home.       SUBJECTIVE:   Patient stated ???I thought the pain was away.???      OBJECTIVE DATA SUMMARY:       Past Medical History:   Diagnosis Date   ??? Anxiety     ??? Asthma     ??? Blood clot associated with vein wall inflammation     ??? Cardiac echocardiogram 11/07/2015     EF 60%.  No RWMA.  Gr 1 DDfx.  RVSP 30 mmHg.     ??? Cardiovascular lower extremity venous duplex 11/06/2015     No DVT bilaterally.   ??? Chronic kidney disease     ??? Depression     ??? Developmental delay     ??? Diabetes (HCC)     ??? Diabetes mellitus (HCC)     ??? History of nuclear stress test 03/2016     No ischemia, no infarct, EF 67%.  Low risk study   ??? Hypertension     ??? Insomnia     ??? Osteoporosis     ??? Pneumonia 2010   ??? Thromboembolus [facility])       Past Surgical History:   Procedure Laterality Date   ??? BREAST SURGERY PROCEDURE UNLISTED          Left cyst removed     ??? HAND/FINGER SURGERY UNLISTED       ??? HX BACK SURGERY   2012     L4/L5/S1 fused in [place]   ??? HX CYST INCISION AND DRAINAGE Left     ??? HX CYST REMOVAL         calf    ??? HX GYN         complete hyst    ??? HX GYN         tubal ligation   ??? HX HYSTERECTOMY       ??? HX KNEE ARTHROSCOPY       ??? HX OOPHORECTOMY       ??? HX ORTHOPAEDIC         Right shoulder rotator cuff    ??? HX ORTHOPAEDIC         Left Little toe hammer toe, left big toe bunion removed   ??? HX ORTHOPAEDIC         Right knee torn meniscus   ??? HX ORTHOPAEDIC         CTS bilateral , Left thumb trigger finger    ??? HX WRIST FRACTURE TX       ??? LAMINECTOMY,CERVICAL       ??? PR ANESTH,SURGERY OF SHOULDER         Barriers to Learning/Limitations: None  Compensate with: visual, verbal, tactile, kinesthetic cues/model  GCODES(GP):Mobility [id number] Current  CK= 40-59%  [id number] Goal  CI= 1-19%.  The severity rating is based on the [facility] Sitting Balance Scale  0: Pt performs 25% or less of sitting activity (Max assist) CN, 100% impaired.  1: Pt supports self with upper extremities but requires therapist assistance. Pt performs 25-50% of effort (Mod assist) CM, 80% to <100% impaired.  1+: Pt supports self with upper  extremities but requires therapist assistance. Pt performs >50% effort. (Min assist). CL, 60% to <80% impaired.  2: Pt supports self independently with both upper extremities. CL, 60% to <80% impaired.  2+: Pt support self independently with 1 upper extremity. CK, 40% to <60% impaired.  3: Pt sits without upper extremity support for up to 30 seconds. CK, 40% to <60% impaired.  3+: Pt sits without upper extremity support for 30 seconds or greater. CJ, 20% to <40% impaired.  4: Pt moves and returns trunkal midpoint 1-2 inches in one plane. CJ, 20% to <40% impaired.  4+: Pt moves and returns trunkal midpoint 1-2 inches in multiple planes. CI, 1% to <20% impaired.  5: Pt moves and returns trunkal midpoint in all planes greater than 2 inches. CH, 0% impaired.   Prior Level of Function/Home Situation: Pt reports she was mod (I) with rollator.  Home Situation  Home Environment: Private residence  # Steps to Enter: 0  One/Two Story Residence: One story  Living Alone: No  Support Systems: Family member(s), Friends \\ neighbors  Patient Expects to be Discharged to:: Apartment  Current DME Used/Available at Home: Environmental consultant, Occupational hygienist, Medical laboratory scientific officer, straight  Critical Behavior:  Neurologic State: Alert  Orientation Level: Oriented X4  Cognition: Follows commands  Psychosocial  Patient Behaviors: Calm;Cooperative  Purposeful Interaction: Yes  Pt Identified Daily Priority: Clinical issues (comment)  Caritas Process: Nurture loving kindness  Caring Interventions: Reassure  Reassure: Caring rounds  Strength:    Strength: Generally decreased, functional  Tone & Sensation:   Sensation: Impaired (occasional tingling (B) L5)  Range Of Motion:  AROM: Generally decreased, functional  PROM: Within functional limits  Functional Mobility:  Bed Mobility:  Rolling: Minimum assistance  Supine to Sit: Moderate assistance;Maximum assistance;Assist x1;Additional time  Sit to Supine: Moderate assistance;Assist x1;Additional time  Transfers:  Sit to Stand:  (did not assess)  Balance:   Sitting: Impaired  Sitting - Static: Fair (occasional)  Sitting - Dynamic: Fair (occasional)  Ambulation/Gait Training:  Gait Description (WDL):  (did not assess)  Pain:  Pre-treatment level: 0  Location: back  Post-treatment level: 10 when sitting, 5 after returning supine  Activity Tolerance:   Poor, pt unable to tolerate sitting up EOB more than 15 seconds due to complaints of back pain.  Please refer to the flowsheet for vital signs taken during this treatment.  After treatment:            Patient left in no apparent distress sitting up in chair           Patient left in no apparent distress in bed           Call bell left within reach           Nursing notified           Caregiver present            Bed alarm activated      COMMUNICATION/EDUCATION:            Fall prevention education was provided and the patient/caregiver indicated understanding.           Patient/family have participated as able in goal setting and plan of care.           Patient/family agree to work toward stated goals and plan of care.           Patient understands intent and goals of therapy, but is neutral about his/her participation.    Patient is unable to participate in goal setting and plan of care.     Thank you for this referral.  Luz Brazen PT, DPT   Time Calculation: 12 mins

## 2016-07-06 NOTE — Progress Notes (Signed)
PCCM     Patient not in room. Off floor for testing. Will return later this afternoon for consult.    Jeanell Sparrow, NP

## 2016-07-06 NOTE — Progress Notes (Signed)
Chaplain conducted an initial consultation and Spiritual Assessment for Cynthia Marsh, who is a 64 y.o.,female. Patient???s Primary Language is: [place].   According to the patient???s EMR Religious Affiliation is: Baptist.     The reason the Patient came to the hospital is:   Patient Active Problem List    Diagnosis Date Noted   ??? Community acquired pneumonia 07/05/2016   ??? Rhabdomyolysis 07/05/2016   ??? Pneumonia 07/05/2016   ??? Essential hypertension 04/27/2016   ??? Dyslipidemia 04/27/2016   ??? Type 2 diabetes mellitus without complication, without long-term current use of insulin (HCC) 04/27/2016   ??? Encounter for long-term (current) use of medications 03/29/2016   ??? Post laminectomy syndrome 03/29/2016   ??? Chronic midline low back pain with sciatica 03/29/2016   ??? History of depression 03/29/2016   ??? History of anxiety 03/29/2016   ??? Osteoarthritis of lumbar spine 03/29/2016   ??? History of lumbar fusion, L4-sacrum 01/11/2016   ??? Encephalopathy acute 12/05/2015   ??? Hypokalemia 12/03/2015   ??? Ataxia 12/03/2015   ??? Altered mental status 12/03/2015   ??? Pulmonary emboli (HCC) 11/07/2015   ??? Atypical chest pain 11/06/2015   ??? Acute pulmonary embolism (HCC) 11/06/2015   ??? Hx of spinal fusion 08/17/2015   ??? Intractable migraine without aura and without status migrainosus 08/06/2015   ??? Mild intermittent asthma without complication 04/20/2015   ??? Chronic pain of right knee 04/20/2015   ??? Type 2 diabetes mellitus without complication (HCC) 04/20/2015   ??? Essential hypertension with goal blood pressure less than 140/90 04/20/2015   ??? Depression    ??? Anxiety    ??? Insomnia         The Chaplain provided the following Interventions:  Initiated a relationship of care and support.   Listened empathically.  Provided information about Spiritual Care Services.  Chart reviewed.    The following outcomes where achieved:  Patient expressed gratitude for chaplain's visit.    Assessment:   Patient does not have any religious/cultural needs that will affect patient???s preferences in health care.  There are no spiritual or religious issues which require intervention at this time.     Plan:  Chaplains will continue to follow and will provide pastoral care on an as needed/requested basis.  Chaplain recommends bedside caregivers page chaplain on duty if patient shows signs of acute spiritual or emotional distress.    Chaplain Eilene Ghazi  Spiritual Care  [phone])

## 2016-07-06 NOTE — Other (Signed)
Bedside and Verbal shift change report given to J Paxton, RN (oncoming nurse) by Zaneta L Hobbs, RN   (offgoing nurse). Report included the following information SBAR, Kardex, Intake/Output and MAR.

## 2016-07-06 NOTE — Other (Signed)
Bedside and Verbal shift change report given to Thompson Grayer (oncoming nurse) by [email or web address] (offgoing nurse). Report included the following information Kardex, Intake/Output and MAR.

## 2016-07-06 NOTE — Other (Signed)
GLYCEMIC CONTROL PLAN OF CARE    Assessment:  Pt is 65 yr old female admitted on 07/05/16 with chest pain and shortness of breath. Pt with past medical history significant for CKD, Asthma, PE, T2DM, PNA, HTN, Insomnia.    Recommendations:  Pt was not accepting insulin. Diabetic education.    Continue correctional lispro ACHS.     Will continue inpatient monitoring for intervention.    Most recent blood glucose values:  Results for LEVIA, WEINBAUM (MRN [id number]) as of 07/06/2016 15:04   Ref. Range 07/05/2016 20:29 07/06/2016 08:37 07/06/2016 10:53   GLUCOSE,FAST - POC Latest Ref Range: 70 - 110 mg/dL 850 (H) 277 (H) 412 (H)     Current A1C:  7.5%- 07/06/16 estimated average blood glucose of 169mg /dl over the past 2-3 months    Current hospital diabetes medications:  Correctional lispro ACHS- normal insulin sensitivity scale    Diet:   Diabetic consistent carbohydrate    Home diabetes medications:  Pt reports taking 50-1000 mg janumet daily    Goals:  Pt BG will be within target range by 8/5/17_____    Education:  __x_  Refer to Diabetes Education Record             ___  Education not indicated at this time    Francis Gaines, MS, RD, CDE  Pager: [phone]

## 2016-07-06 NOTE — Consults (Addendum)
[facility]  Pulmonary, Critical Care, and Sleep Medicine    Initial Patient Consult    Name: Cynthia Marsh MRN: [id number]   DOB: [birth year] Hospital: [facility]   Date: 07/06/2016          Subjective:     This patient has been seen and evaluated at the request of Dr. Tyler Pita  for pneumonia. Patient is a 65 y.o. female who presented to the ED with complaints of shortness of breath and pneumonia and lower back pain who was admitted. Patient stated that she initially went to the ED on Saturday for her back pain and was sent home patient stated that she had continued back pain and also developed some shortness of breath when walking from the bedroom to the kitchen and initially attributed the shortness of breath to the heat and humidity outside. Patient stated that the shortness of breath started about 2 weeks prior today, but became worse on Sunday. She went back to the ED where an MRI of her back was done to look for any infectious spinal process. It was negative for a spinal process how ever Right sided pneumonia was diagnosed and the patient was sent home on Levaquin for 10 days starting on 7/30. Patient stated that she a few day later went to her PCP office and was instructed to go to the ED if symptoms worsened in which her shortness of breath and chest pain did. CTA was done negative for PE but showed pneumonia and some small pleural effusions, Patient was admitted. Started on Zosyn and Vancomycin IV for Pneumonia, Echo done. Patient denies having fever and chills, diarrhea, vomiting, constipation, dizziness, abdominal pain and headache. Denies COPD and sleep apnea. Patient states that she lives with her husband who is disabled and has COPD.        Past Medical History:   Diagnosis Date   ??? Anxiety    ??? Asthma    ??? 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 Cardiac echocardiogram 11/07/2015    EF 60%.  No RWMA.  Gr 1 DDfx.  RVSP 30 mmHg.      ??? Cardiovascular lower extremity venous duplex 11/06/2015    No DVT bilaterally.   ??? Chronic kidney disease    ??? Depression    ??? Developmental delay    ??? Diabetes ([place])    ??? Diabetes mellitus ([place])    ??? History of nuclear stress test 03/2016    No ischemia, no infarct, EF 67%.  Low risk study   ??? Hypertension    ??? Insomnia    ??? Osteoporosis    ??? Pneumonia 2010   ??? Thromboembolus [facility])       Past Surgical History:   Procedure Laterality Date   ??? BREAST SURGERY PROCEDURE UNLISTED       Left cyst removed    ??? HAND/FINGER SURGERY UNLISTED     ??? HX BACK SURGERY  2012    L4/L5/S1 fused in [place]   ??? HX CYST INCISION AND DRAINAGE Left    ??? HX CYST REMOVAL      calf    ??? HX GYN      complete hyst    ??? HX GYN      tubal ligation   ??? HX HYSTERECTOMY     ??? HX KNEE ARTHROSCOPY     ??? HX OOPHORECTOMY     ??? HX ORTHOPAEDIC      Right shoulder rotator  cuff    ??? HX ORTHOPAEDIC      Left Little toe hammer toe, left big toe bunion removed   ??? HX ORTHOPAEDIC      Right knee torn meniscus   ??? HX ORTHOPAEDIC      CTS bilateral , Left thumb trigger finger    ??? HX WRIST FRACTURE TX     ??? LAMINECTOMY,CERVICAL     ??? PR ANESTH,SURGERY OF SHOULDER        Prior to Admission medications    Medication Sig Start Date End Date Taking? Authorizing Provider   levoFLOXacin (LEVAQUIN) 750 mg tablet Take 1 Tab by mouth daily for 10 days. 07/03/16 07/13/16  Evelena Leyden, MD   cyclobenzaprine (FLEXERIL) 5 mg tablet Take 1 Tab by mouth nightly. 07/02/16   Darren Doristine Bosworth, PA   oxyCODONE-acetaminophen (PERCOCET 10) 10-325 mg per tablet Take  by mouth.    Historical Provider   tiotropium bromide (SPIRIVA RESPIMAT) 1.25 mcg/actuation inhaler Take 2 Puffs by inhalation daily.    Historical Provider   fluticasone (FLOVENT DISKUS) 100 mcg/actuation dsdv Take  by inhalation.    Historical Provider   meloxicam (MOBIC) 15 mg tablet Take 1 Tab by mouth daily. 05/06/16   Rogelio Seen, PA-C   trimethoprim-polymyxin b (POLYTRIM) ophthalmic solution Administer 1 Drop  to both eyes every four (4) hours. 05/06/16   Hurman Horn, PA-C   hydrALAZINE (APRESOLINE) 25 mg tablet Take 25 mg by mouth two (2) times a day.    Historical Provider   budesonide-formoterol (SYMBICORT) 80-4.5 mcg/actuation HFAA inhaler Take 2 Puffs by inhalation two (2) times a day.    Historical Provider   ALPRAZolam (XANAX) 0.5 mg tablet TK 1 T PO BID.  MAXIMUM 2 TS D. 01/07/16   Historical Provider   FREESTYLE LITE STRIPS strip USE TO CHECK BLOOD SUGAR BID 03/08/16   Historical Provider   busPIRone (BUSPAR) 15 mg tablet TK 1 T PO TID 03/18/16   Historical Provider   FREESTYLE LANCETS 28 gauge misc CHECK BLOOD SUGAR BID 03/08/16   Historical Provider   JANUMET 50-1,000 mg per tablet TK 1 T PO BID. STOP TAKING METFORMIN. 03/17/16   Historical Provider   pregabalin (LYRICA) 75 mg capsule Take 1 Cap by mouth three (3) times daily. Max Daily Amount: 225 mg. 03/29/16   Orlena Sheldon, MD   XARELTO 20 mg tab tablet Take 1 Tab by mouth daily. 03/07/16   Historical Provider   amLODIPine (NORVASC) 10 mg tablet Take 1 Tab by mouth daily. 02/07/16   Historical Provider   azelastine (ASTELIN) 137 mcg (0.1 %) nasal spray 1 Spray by Both Nostrils route two (2) times a day. 01/06/16   Historical Provider   DULoxetine (CYMBALTA) 30 mg capsule Take 1 Cap by mouth daily. 03/09/16   Historical Provider   fluticasone (FLONASE) 50 mcg/actuation nasal spray 2 Sprays by Both Nostrils route nightly. 02/16/16   Historical Provider   hydroCHLOROthiazide (HYDRODIURIL) 25 mg tablet Take 1 Tab by mouth daily. 01/02/16   Historical Provider   mirtazapine (REMERON) 30 mg tablet Take 1 Tab by mouth daily. 03/09/16   Historical Provider   montelukast (SINGULAIR) 10 mg tablet Take 1 Tab by mouth daily. 02/16/16   Historical Provider   omeprazole (PRILOSEC) 20 mg capsule Take 1 Cap by mouth daily. 02/02/16   Historical Provider   oxybutynin chloride XL (DITROPAN XL) 10 mg CR tablet Take 1 Tab by mouth daily. 03/08/16   Historical  Provider    pravastatin (PRAVACHOL) 40 mg tablet Take 1 Tab by mouth daily. 02/07/16   Historical Provider   zolpidem (AMBIEN) 10 mg tablet Take 1 Tab by mouth nightly. 03/09/16   Historical Provider     Allergies   Allergen Reactions   ??? 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   ??? Almond Other (comments)     Tongue itches   ??? Carvedilol Other (comments)   ??? Cozaar [Losartan] Cough   ??? Crestor [Rosuvastatin] Unknown (comments)     Heart beats fast      Social History   Substance Use Topics   ??? Smoking status: Never Smoker   ??? Smokeless tobacco: Never Used   ??? Alcohol use Yes      Comment: on occasion      Family History   Problem Relation Age of Onset   ??? Diabetes Mother    ??? Diabetes Brother    ??? Cancer Sister    ??? Cancer Maternal Grandmother    ??? Cancer Daughter       Current Facility-Administered Medications   Medication Dose Route Frequency   ??? albuterol (PROVENTIL VENTOLIN) nebulizer solution 2.5 mg  2.5 mg Nebulization Q4H RT   ??? [START ON 07/07/2016] Vancomycin Trough Due   1 Each Other Rx Dosing/Monitoring   ??? piperacillin-tazobactam (ZOSYN) 3.375 g in 0.9% sodium chloride (MBP/ADV) 100 mL MBP  3.375 g IntraVENous Q6H   ??? azelastine (ASTELIN) 125mg/spray nasal spray  1 Spray Both Nostrils BID   ??? budesonide-formoterol (SYMBICORT) 80-4.5 mcg inhaler  2 Puff Inhalation BID RT   ??? DULoxetine (CYMBALTA) capsule 30 mg  30 mg Oral DAILY   ??? oxybutynin chloride XL (DITROPAN XL) tablet 10 mg  10 mg Oral DAILY   ??? pregabalin (LYRICA) capsule 75 mg  75 mg Oral TID   ??? rivaroxaban (XARELTO) tablet 20 mg  20 mg Oral DAILY   ??? insulin lispro (HUMALOG) injection   SubCUTAneous AC&HS   ??? pantoprazole (PROTONIX) tablet 40 mg  40 mg Oral ACB   ??? vancomycin (VANCOCIN) 1,000 mg in 0.9% sodium chloride (MBP/ADV) 250 mL adv  1,000 mg IntraVENous Q12H       Review of Systems:  Pertinent items are noted in HPI.    Objective:   Vital Signs:    Visit Vitals   ??? BP 111/76 (BP 1 Location: Left arm, BP Patient Position: At rest)    ??? Pulse 94   ??? Temp 97.1 ??F (36.2 ??C)   ??? Resp 18   ??? Ht '5\' 4"'$  (1.626 m)   ??? Wt 82.5 kg (181 lb 14.4 oz)   ??? SpO2 95%   ??? Breastfeeding No   ??? BMI 31.22 kg/m2       O2 Device: Nasal cannula   O2 Flow Rate (L/min): 2 l/min (increased to 3 LPM)   Temp (24hrs), Avg:97.7 ??F (36.5 ??C), Min:97.1 ??F (36.2 ??C), Max:98.9 ??F (37.2 ??C)       Intake/Output:   Last shift:      08/02 0701 - 08/02 1900  In: 360 [P.O.:360]  Out: 750 [Urine:750]  Last 3 shifts: 07/31 1901 - 08/02 0700  In: 200 [I.V.:200]  Out: 300 [Urine:300]    Intake/Output Summary (Last 24 hours) at 07/06/16 1628  Last data filed at 07/06/16 1507   Gross per 24 hour   Intake  560 ml   Output             1050 ml   Net             -490 ml      Physical Exam:   General:  Alert, cooperative, no distress, appears stated age.   Head:  Normocephalic, without obvious abnormality, atraumatic.   Eyes:  Conjunctivae/corneas clear. PERRL, EOMs intact.   Nose: Nares normal. Mucosa normal. No drainage or sinus tenderness.   Throat: Lips, mucosa, and tongue normal. Teeth and gums normal.   Neck: Supple, symmetrical, trachea midline, no adenopathy, thyroid: no enlargment/tenderness/nodules, no carotid bruit and no JVD.       Lungs:   Crackles and Diminished sounds in lower right base, no wheezing, rales. Good airway entry, no dyspnea.    Chest wall:  No tenderness or deformity.   Heart:  Regular rate and rhythm, S1, S2 normal, no murmur, click, rub or gallop.   Abdomen:   Soft, non-tender. Bowel sounds normal. No masses,  No organomegaly.   Extremities: Extremities normal, atraumatic, no cyanosis. + trace edema.   Pulses: 2+ and symmetric all extremities.   Skin: Skin color, texture, turgor normal. No rashes or lesions   Lymph nodes: Cervical, supraclavicular nodes- normal.   Neurologic: Grossly nonfocal     Data review:     Recent Results (from the past 24 hour(s))   EKG, 12 LEAD, INITIAL    Collection Time: 07/05/16  5:25 PM   Result Value Ref Range     Ventricular Rate 105 BPM    Atrial Rate 105 BPM    P-R Interval 144 ms    QRS Duration 70 ms    Q-T Interval 348 ms    QTC Calculation (Bezet) 459 ms    Calculated P Axis 62 degrees    Calculated R Axis -6 degrees    Calculated T Axis 36 degrees    Diagnosis       Sinus tachycardia  Otherwise normal ECG  When compared with ECG of 03-Jul-2016 11:34,  No significant change was found  Confirmed by Burnell Blanks MD [phone]) on 07/05/2016 8:30:23 PM     CBC WITH AUTOMATED DIFF    Collection Time: 07/05/16  5:30 PM   Result Value Ref Range    WBC 9.1 4.6 - 13.2 K/uL    RBC 4.12 (L) 4.20 - 5.30 M/uL    HGB 10.7 (L) 12.0 - 16.0 g/dL    HCT 33.2 (L) 35.0 - 45.0 %    MCV 80.6 74.0 - 97.0 FL    MCH 26.0 24.0 - 34.0 PG    MCHC 32.2 31.0 - 37.0 g/dL    RDW 15.0 (H) 11.6 - 14.5 %    PLATELET 336 135 - 420 K/uL    MPV 9.5 9.2 - 11.8 FL    NEUTROPHILS 70 40 - 73 %    LYMPHOCYTES 15 (L) 21 - 52 %    MONOCYTES 15 (H) 3 - 10 %    EOSINOPHILS 0 0 - 5 %    BASOPHILS 0 0 - 2 %    ABS. NEUTROPHILS 6.4 1.8 - 8.0 K/UL    ABS. LYMPHOCYTES 1.3 0.9 - 3.6 K/UL    ABS. MONOCYTES 1.4 (H) 0.05 - 1.2 K/UL    ABS. EOSINOPHILS 0.0 0.0 - 0.4 K/UL    ABS. BASOPHILS 0.0 0.0 - 0.1 K/UL    DF AUTOMATED     METABOLIC PANEL, BASIC  Collection Time: 07/05/16  5:30 PM   Result Value Ref Range    Sodium 136 136 - 145 mmol/L    Potassium 3.0 (L) 3.5 - 5.5 mmol/L    Chloride 97 (L) 100 - 108 mmol/L    CO2 28 21 - 32 mmol/L    Anion gap 11 3.0 - 18 mmol/L    Glucose 118 (H) 74 - 99 mg/dL    BUN 15 7.0 - 18 MG/DL    Creatinine 0.78 0.6 - 1.3 MG/DL    BUN/Creatinine ratio 19 12 - 20      GFR est AA >60 >60 ml/min/1.38m    GFR est non-AA >60 >60 ml/min/1.721m   Calcium 9.9 8.5 - 10.1 MG/DL   CARDIAC PANEL,(CK, CKMB & TROPONIN)    Collection Time: 07/05/16  5:30 PM   Result Value Ref Range    CK 1272 (H) 26 - 192 U/L    CK - MB 3.9 (H) <3.6 ng/ml    CK-MB Index 0.3 0.0 - 4.0 %    Troponin-I, Qt. <0.02 0.0 - 0.045 NG/ML   MAGNESIUM     Collection Time: 07/05/16  5:30 PM   Result Value Ref Range    Magnesium 2.0 1.6 - 2.6 mg/dL   PTT    Collection Time: 07/05/16  5:30 PM   Result Value Ref Range    aPTT 47.6 (H) 23.0 - 36.4 SEC   PROTHROMBIN TIME + INR    Collection Time: 07/05/16  5:30 PM   Result Value Ref Range    Prothrombin time 19.4 (H) 11.5 - 15.2 sec    INR 1.7 (H) 0.8 - 1.2     POC LACTIC ACID    Collection Time: 07/05/16  5:39 PM   Result Value Ref Range    Lactic Acid (POC) 1.0 0.4 - 2.0 mmol/L   EKG, 12 LEAD, INITIAL    Collection Time: 07/05/16  7:58 PM   Result Value Ref Range    Ventricular Rate 97 BPM    Atrial Rate 97 BPM    P-R Interval 140 ms    QRS Duration 70 ms    Q-T Interval 382 ms    QTC Calculation (Bezet) 485 ms    Calculated P Axis 63 degrees    Calculated R Axis 1 degrees    Calculated T Axis 36 degrees    Diagnosis       Normal sinus rhythm  Normal ECG  When compared with ECG of 05-Jul-2016 17:25,  No significant change was found  Confirmed by Hoffmier, ThMarcello MooresD ([id number]), editor BuBurton Apley1989 [phone]   07/06/2016 6:29:12 AM     URINALYSIS W/ RFLX MICROSCOPIC    Collection Time: 07/05/16  8:24 PM   Result Value Ref Range    Color YELLOW      Appearance CLEAR      Specific gravity >1.030 (H) 1.005 - 1.030    pH (UA) 6.0 5.0 - 8.0      Protein TRACE (A) NEG mg/dL    Glucose NEGATIVE  NEG mg/dL    Ketone 40 (A) NEG mg/dL    Bilirubin NEGATIVE  NEG      Blood NEGATIVE  NEG      Urobilinogen 1.0 0.2 - 1.0 EU/dL    Nitrites NEGATIVE  NEG      Leukocyte Esterase NEGATIVE  NEG     STREP PNEUMO AG, URINE    Collection Time: 07/05/16  8:24 PM   Result Value  Ref Range    Strep pneumo Ag, urine NEGATIVE  NEG     LEGIONELLA PNEUMOPHILA AG, URINE     Collection Time: 07/05/16  8:24 PM   Result Value Ref Range    Legionella Ag, urine NEGATIVE  NEG     CULTURE, URINE    Collection Time: 07/05/16  8:24 PM   Result Value Ref Range    Special Requests: NO SPECIAL REQUESTS      Culture result: NO GROWTH AFTER 13 HOURS      URINE MICROSCOPIC ONLY    Collection Time: 07/05/16  8:24 PM   Result Value Ref Range    WBC 0 to 2 0 - 4 /hpf    RBC NEGATIVE  0 - 5 /hpf    Epithelial cells NEGATIVE  0 - 5 /lpf    Bacteria NEGATIVE  NEG /hpf    Mucus 1+ (A) NEG /lpf    Hyaline cast 0 to 2 0 - 2 /lpf   GLUCOSE, POC    Collection Time: 07/05/16  8:29 PM   Result Value Ref Range    Glucose (POC) 132 (H) 70 - 110 mg/dL   CARDIAC PANEL,(CK, CKMB & TROPONIN)    Collection Time: 07/05/16 10:32 PM   Result Value Ref Range    CK 909 (H) 26 - 192 U/L    CK - MB 2.9 <3.6 ng/ml    CK-MB Index 0.3 0.0 - 4.0 %    Troponin-I, Qt. <0.02 0.0 - 0.045 NG/ML   TSH 3RD GENERATION    Collection Time: 07/05/16 10:32 PM   Result Value Ref Range    TSH 1.34 0.36 - 3.74 uIU/mL   HEMOGLOBIN A1C WITH EAG    Collection Time: 07/06/16  2:35 AM   Result Value Ref Range    Hemoglobin A1c 7.5 (H) 4.2 - 5.6 %    Est. average glucose 169 mg/dL   CARDIAC PANEL,(CK, CKMB & TROPONIN)    Collection Time: 07/06/16  2:36 AM   Result Value Ref Range    CK 867 (H) 26 - 192 U/L    CK - MB 2.3 <3.6 ng/ml    CK-MB Index 0.3 0.0 - 4.0 %    Troponin-I, Qt. 0.02 0.0 - 0.045 NG/ML   CBC WITH AUTOMATED DIFF    Collection Time: 07/06/16  2:36 AM   Result Value Ref Range    WBC 7.0 4.6 - 13.2 K/uL    RBC 3.53 (L) 4.20 - 5.30 M/uL    HGB 9.2 (L) 12.0 - 16.0 g/dL    HCT 29.1 (L) 35.0 - 45.0 %    MCV 82.4 74.0 - 97.0 FL    MCH 26.1 24.0 - 34.0 PG    MCHC 31.6 31.0 - 37.0 g/dL    RDW 15.5 (H) 11.6 - 14.5 %    PLATELET 299 135 - 420 K/uL    MPV 9.9 9.2 - 11.8 FL    NEUTROPHILS 62 40 - 73 %    LYMPHOCYTES 21 21 - 52 %    MONOCYTES 16 (H) 3 - 10 %    EOSINOPHILS 1 0 - 5 %    BASOPHILS 0 0 - 2 %    ABS. NEUTROPHILS 4.4 1.8 - 8.0 K/UL    ABS. LYMPHOCYTES 1.4 0.9 - 3.6 K/UL    ABS. MONOCYTES 1.1 0.05 - 1.2 K/UL    ABS. EOSINOPHILS 0.1 0.0 - 0.4 K/UL    ABS. BASOPHILS 0.0 0.0 - 0.1 K/UL  DF AUTOMATED     METABOLIC PANEL, COMPREHENSIVE    Collection Time: 07/06/16  2:36 AM   Result Value Ref Range     Sodium 139 136 - 145 mmol/L    Potassium 3.2 (L) 3.5 - 5.5 mmol/L    Chloride 104 100 - 108 mmol/L    CO2 25 21 - 32 mmol/L    Anion gap 10 3.0 - 18 mmol/L    Glucose 113 (H) 74 - 99 mg/dL    BUN 11 7.0 - 18 MG/DL    Creatinine 0.52 (L) 0.6 - 1.3 MG/DL    BUN/Creatinine ratio 21 (H) 12 - 20      GFR est AA >60 >60 ml/min/1.19m    GFR est non-AA >60 >60 ml/min/1.755m   Calcium 8.3 (L) 8.5 - 10.1 MG/DL    Bilirubin, total 0.6 0.2 - 1.0 MG/DL    ALT (SGPT) 26 13 - 56 U/L    AST (SGOT) 36 15 - 37 U/L    Alk. phosphatase 59 45 - 117 U/L    Protein, total 6.4 6.4 - 8.2 g/dL    Albumin 2.3 (L) 3.4 - 5.0 g/dL    Globulin 4.1 (H) 2.0 - 4.0 g/dL    A-G Ratio 0.6 (L) 0.8 - 1.7     MAGNESIUM    Collection Time: 07/06/16  2:36 AM   Result Value Ref Range    Magnesium 1.9 1.6 - 2.6 mg/dL   LIPID PANEL    Collection Time: 07/06/16  2:36 AM   Result Value Ref Range    LIPID PROFILE          Cholesterol, total 113 <200 MG/DL    Triglyceride 149 <150 MG/DL    HDL Cholesterol 21 (L) 40 - 60 MG/DL    LDL, calculated 62.2 0 - 100 MG/DL    VLDL, calculated 29.8 MG/DL    CHOL/HDL Ratio 5.4 (H) 0 - 5.0     GLUCOSE, POC    Collection Time: 07/06/16  8:37 AM   Result Value Ref Range    Glucose (POC) 123 (H) 70 - 110 mg/dL   GLUCOSE, POC    Collection Time: 07/06/16 10:53 AM   Result Value Ref Range    Glucose (POC) 185 (H) 70 - 110 mg/dL     Results for JAEVANEE, LUBRANOMRN [id number] of 07/06/2016 20:52   Ref. Range 11/06/2015 10:32 11/06/2015 14:24 07/05/2016 17:30 07/05/2016 22:32 07/06/2016 02:36   CK Latest Ref Range: 26 - 192 U/L 46 39 1272 (H) 909 (H) 867 (H)     Results for JATORUNN, CHANCELLORMRN [id number] of 07/06/2016 20:52   Ref. Range 02/02/2016 13:15 02/03/2016 06:22 07/03/2016 11:23 07/05/2016 17:30 07/06/2016 02:36   HGB Latest Ref Range: 12.0 - 16.0 g/dL 12.4 11.9 (L) 12.2 10.7 (L) 9.2 (L)       Imaging:  I have personally reviewed the patient???s radiographs and have reviewed the reports:  CXR Results  (Last 48 hours)                07/05/16 1725  XR CHEST SNGL V Final result    Narrative:  TWO VIEW CHEST RADIOGRAPH        CPT CODE: 7162831     INDICATION: Shortness of breath, pneumonia.       COMPARISON: 07/03/2016       FINDINGS:        Lungs are hypoinflated. Cardiomediastinal silhouette is unchanged. Pulmonary  vasculature is normal. There is increased linear densities at the right greater   than left lung bases. There may be small bilateral pleural effusions. Biapical   pleural scarring. No pneumothorax.       IMPRESSION:       Increased linear densities at the right greater than left lung bases with   suspected small pleural effusions. Morphology is more suggestive of subsegmental   atelectasis, though infection cannot be entirely excluded.             CT Results  (Last 48 hours)               07/05/16 1924  CTA CHEST W OR W WO CONT Final result    Narrative:  CT Chest Pulmonary       CPT CODE: [id number]       HISTORY: Chest pain and shortness of breath. History of PE.       COMPARISON: Chest 2 views 07/05/2016. CT chest pulmonary 05/16/2016.       TECHNIQUE: 2.5 millimeter contiguous axial images from the lung apices to the   level of the adrenal glands following timed contrast bolus for maximum   intravascular enhancement of the pulmonary arteries.  Images reviewed in soft   tissue and lung windows. Coronal and sagittal MIP reformations were performed   for better evaluation of tortuous branching pulmonary vessels. Isovue-370 100 mL   IV contrast.       FINDINGS:        Suboptimal contrast bolus timing. The main pulmonary artery and lobar branches   are fairly well opacified, but the segmental and distal branches are not as well   opacified with contrast. Within this limitation there is no definite finding of   an acute pulmonary embolus. No focal filling defect within the main pulmonary   arteries or lobar branches. Some heterogeneous contrast density within the right    upper lobe and right middle lobe segmental branches, but not occlusive.       Aorta is moderately well opacified, appears normal.       Small bilateral pleural effusions. Lobar consolidation in the posterior right   lower lobe. Some subsegmental opacification in the right middle lobe. Some   linear opacities in the lingula and posterior left lower lobe likely   subsegmental atelectasis.       The heart is enlarged, similar to the prior exam. No pericardial effusion.       Small hiatal hernia. Visible portions of the liver and spleen appear normal.   Adrenals are not enlarged. Visible portions of the pancreas and kidneys   unremarkable.       IMPRESSION:       Less than optimal contrast bolus. Proximal pulmonary arteries are well   opacified, more distal vessels not as well opacified limiting evaluation for   pulmonary embolus beyond the lobar branches.       Within the limitations of the exam there is no finding of central pulmonary   embolus.       Lobar consolidation in the posterior right lower lobe consistent with pneumonia.   Right middle lobe opacity that may be atelectasis or infection.              ECHO:  Echo Results  (Last 48 hours)               07/06/16 1343  2D ECHO COMPLETE ADULT (TTE) W OR WO CONTR Final result    Narrative:  [facility]   [address] [place] [address]   [phone]       Transthoracic Echocardiogram       Patient: Cynthia Marsh   MRN: [id number]   [facility] #: [id number]   DOB: [birth year]   Age: 16 years   Gender: Female   Height: 64 in   Weight: 180.6 lb   BSA: 1.88 m-sq   BP: 101 / 71 mmHg   Study date: 06-Jul-2016   Status: Routine   Location: [facility]   DMS ACC #: [id number]       Allergies: TREE NUT, ALMOND, CARVEDILOL, LOSARTAN, ROSUVASTATIN       Referring_Ordering Physician:  Donna Christen Hospitalist   Interpreting Group:  [place]   Interpreting Physician:  Jun K. Boyd Kerbs, MD   Technologist:  Jackelyn Hoehn, RCS, RDCS       IMPRESSIONS:    A clinically significant myopathic process is ruled out. There was no   significant valvular heart disease.       SUMMARY:   Procedure information: This was a technically difficult study.       Left ventricle: Systolic function was normal by visual assessment.   Ejection fraction was estimated to be 60 %. No obvious wall motion   abnormalities identified in the views obtained.       Right ventricle: Systolic pressure was mildly increased. Estimated peak   pressure was 45 mmHg.       COMPARISONS:   Comparison was made with the previous study of 07-Nov-2015. Pulmonary   artery pressure has increased from 30 mmHg to 45 mmHg.       INDICATIONS: Abnormal cardiac markers.       HISTORY: Prior history: Risk factors: hypertension, diabetes, and CKD. PE.       PROCEDURE: This was a routine study. The study included complete 2D   imaging, M-mode, complete spectral Doppler, and color Doppler. The heart   rate was 96 bpm, at the start of the study. Systolic blood pressure was   101 mmHg, at the start of the study. Diastolic blood pressure was 71 mmHg,   at the start of the study. Images were obtained from the parasternal,   apical, and subcostal acoustic windows. Echocardiographic views were   limited by poor acoustic window availability. This was a technically   difficult study.       LEFT VENTRICLE: Size was normal. Systolic function was normal by visual   assessment. Ejection fraction was estimated to be 60 %. No obvious wall   motion abnormalities identified in the views obtained. Wall thickness was   normal. DOPPLER: Left ventricular diastolic function parameters were   normal for the patient's age.       RIGHT VENTRICLE: The size was normal. Systolic function was normal.   DOPPLER: Systolic pressure was mildly increased. Estimated peak pressure   was 45 mmHg.       LEFT ATRIUM: Size was normal. LA volume index was 22 ml/m-sq.       RIGHT ATRIUM: Size was normal.        MITRAL VALVE: There was annular calcification. Normal valve structure.   DOPPLER: There was no evidence for stenosis. There was no significant   regurgitation.       AORTIC VALVE: The valve was probably trileaflet. Leaflets exhibited good   mobility. DOPPLER: There was no stenosis. There was no significant   regurgitation.       TRICUSPID VALVE: Normal valve structure.  DOPPLER: There was no evidence   for tricuspid stenosis. There was mild regurgitation. Right atrial   pressure estimate: 3 mmHg.       PULMONIC VALVE: Not well visualized, but normal Doppler findings.       AORTA: The root exhibited normal size.       SYSTEMIC VEINS: IVC: The inferior vena cava was normal in size.   Respirophasic changes were normal.       PERICARDIUM: Insignificant pericardial effusion and/or pericardial fat was   present.       SYSTEM MEASUREMENT TABLES       2D   Ao Diam: 3.04 cm   IVSd: 0.97 cm   LVIDd: 4.03 cm   LVIDs: 2.45 cm   LVPWd: 1.01 cm   LAAs A2C: 16.29 cm2   LAAs A4C: 16.92 cm2   LAESV A-L A2C: 40.51 ml   LAESV A-L A4C: 41.65 ml   LAESV Index (A-L): 22.38 ml/m2   LAESV MOD A2C: 38.65 ml   LAESV MOD A4C: 39.84 ml   LAESV(A-L): 42.07 ml   LALs A2C: 5.56 cm   LALs A4C: 5.83 cm   LVOT Diam: 1.97 cm   RA Area: 13.39 cm2   RV Minor: 3.61 cm       CW   TR Vmax: 3.23 m/s   IVRT: 94.58 ms   TR maxPG: 41.81 mmHG       MM   Tapse: 2.07 cm       PW   LVOT VTI: 19.24 cm   LVOT Vmax: 1.14 m/s   LVOT Vmean: 0.79 m/s   MV A Vel: 1 m/s   MV Dec Slope: 4.98 m/s2   MV DecT: 191.67 ms   MV E Vel: 0.96 m/s   MV E/A Ratio: 0.95   E': 0.07 m/s   E/E': 14   LVOT maxPG: 5.18 mmHG   LVOT meanPG: 2.86 mmHG   LVSI Dopp: 31.33 ml/m2   LVSV Dopp: 58.89 ml   Lateral E': 0.1 m/s   Lateral E/E': 9.71       Prepared and E-signed by       Jun K. Boyd Kerbs, MD   Signed 06-Jul-2016 16:16:43                 IMPRESSION:   ?? Right lower lobe pneumonia- Probable CAP vs other (Strep Pneumo and Legionella Ag negative)- Failure of OP therapy with levaquin  ?? Asthma   ?? Cough  ?? Rhabdomyolosis- mild CPK downtrending  ?? Anemia Hb: 12-->9 : no evidence of acute bleeding on Xarelto  ?? Hypokalemia  ?? Hypoalbuminemia  Past Hx  ?? DM2  ?? HTN  ?? PE 2016 - on Xarelto  ?? Depression/Anxiety  ?? Chronic Lower Back Pain - managed by pain clinic      RECOMMENDATIONS:   ?? Supplemental O2 3L at this time to keep sats > 94% Titrate as tolerated to RA as patient clinically improves.  ?? Patient started Levaquin on 7/30 but was d/c'd. Continue current IV abx Zosyn and Vancomycin for broad spectrum coverage. - Streamline based on cultures and/or at 72 hrs  ?? Obtain sputum sample and send to lab, will f/o.  ?? Monitor for leukocytosis and fever.  ?? Do not feel steroids are needed at this time. Patient was not wheezing on my exam. Consider if respiratory status worsen or wheezing develops.  ?? Broncho hygiene protocol, ICS to bedside  ?? Chest xray in the am. Phos in the am.   ??  Continue home inhalor medications, Cont Pulmicort and Spiriva, albuterol PRN for wheezing.   ?? Continue anticoagulation - Xarelto. - Trend H/H  ?? Glucose control per primary.  ?? Will follow, Thank You.            Meyer Russel, NP     Pulmonary Staff  I have independently evaluated the patient. I agree with the above evaluation , assessment and recommendations along with the following observations. The patient feels much better today. Her low back pain is much improved. Her pain/tenderness is over the lumbosacral region on palpation. No rashes.She had a better appetite today and was able to eat all her meals. She had subjective fevers and chills over the weekend but none today.  The patient also states that she feels she is breathing easier but does have anterior chest pain with cough. + sputum production. No hemoptysis. No wheezing on my exam.    Melissa Montane, DO  Pulmonary, Sleep and Critical Care Medicine

## 2016-07-07 ENCOUNTER — Inpatient Hospital Stay

## 2016-07-07 ENCOUNTER — Inpatient Hospital Stay: Admit: 2016-07-07 | Payer: PRIVATE HEALTH INSURANCE | Primary: Family Medicine

## 2016-07-07 LAB — EKG 12-LEAD
Atrial Rate: 208 {beats}/min
Atrial Rate: 87 {beats}/min
Q-T Interval: 270 ms
Q-T Interval: 274 ms
QRS Duration: 72 ms
QRS Duration: 74 ms
QTc Calculation (Bazett): 456 ms
QTc Calculation (Bazett): 470 ms
R Axis: 7 degrees
R Axis: 8 degrees
T Axis: 23 degrees
T Axis: 31 degrees
Ventricular Rate: 172 {beats}/min
Ventricular Rate: 177 {beats}/min

## 2016-07-07 LAB — CULTURE, URINE
Culture result:: NO GROWTH
Culture: NO GROWTH

## 2016-07-07 LAB — METABOLIC PANEL, BASIC
Anion gap: 8 mmol/L (ref 3.0–18)
BUN/Creatinine ratio: 22 — ABNORMAL HIGH (ref 12–20)
BUN: 11 MG/DL (ref 7.0–18)
CO2: 26 mmol/L (ref 21–32)
Calcium: 9.2 MG/DL (ref 8.5–10.1)
Chloride: 104 mmol/L (ref 100–108)
Creatinine: 0.5 MG/DL — ABNORMAL LOW (ref 0.6–1.3)
GFR est AA: >60 mL/min/{1.73_m2} (ref >60)
GFR est non-AA: >60 mL/min/{1.73_m2} (ref >60)
Glucose: 159 mg/dL — ABNORMAL HIGH (ref 74–99)
Potassium: 3.8 mmol/L (ref 3.5–5.5)
Sodium: 138 mmol/L (ref 136–145)

## 2016-07-07 LAB — VANCOMYCIN, TROUGH
Reported dose date: 20170802
Reported dose time:: 2200
Reported dose:: 1000 UNITS
Vancomycin,trough: 8.6 ug/mL — ABNORMAL LOW (ref 10.0–20.0)

## 2016-07-07 LAB — CARDIAC PANEL,(CK, CKMB & TROPONIN)
CK - MB: <1 ng/ml (ref <3.6)
CK: 543 U/L — ABNORMAL HIGH (ref 26–192)
Troponin-I, QT: 0.02 NG/ML (ref 0.0–0.045)

## 2016-07-07 LAB — CBC W/O DIFF
HCT: 30.3 % — ABNORMAL LOW (ref 35.0–45.0)
HGB: 9.6 g/dL — ABNORMAL LOW (ref 12.0–16.0)
MCH: 25.9 PG (ref 24.0–34.0)
MCHC: 31.7 g/dL (ref 31.0–37.0)
MCV: 81.9 FL (ref 74.0–97.0)
MPV: 9.2 FL (ref 9.2–11.8)
PLATELET: 312 10*3/uL (ref 135–420)
RBC: 3.7 M/uL — ABNORMAL LOW (ref 4.20–5.30)
RDW: 14.9 % — ABNORMAL HIGH (ref 11.6–14.5)
WBC: 6.6 10*3/uL (ref 4.6–13.2)

## 2016-07-07 LAB — EKG, 12 LEAD, SUBSEQUENT
Atrial Rate: 208 {beats}/min
Atrial Rate: 87 {beats}/min
Calculated R Axis: 7 degrees
Calculated R Axis: 8 degrees
Calculated T Axis: 23 degrees
Calculated T Axis: 31 degrees
Q-T Interval: 270 ms
Q-T Interval: 274 ms
QRS Duration: 72 ms
QRS Duration: 74 ms
QTC Calculation (Bezet): 456 ms
QTC Calculation (Bezet): 470 ms
Ventricular Rate: 172 {beats}/min
Ventricular Rate: 177 {beats}/min

## 2016-07-07 LAB — GLUCOSE, POC
Glucose (POC): 151 mg/dL — ABNORMAL HIGH (ref 70–110)
Glucose (POC): 156 mg/dL — ABNORMAL HIGH (ref 70–110)
Glucose (POC): 163 mg/dL — ABNORMAL HIGH (ref 70–110)
Glucose (POC): 198 mg/dL — ABNORMAL HIGH (ref 70–110)
Glucose (POC): 106 mg/dL (ref 70–110)

## 2016-07-07 LAB — TSH 3RD GENERATION: TSH: 1.62 u[IU]/mL (ref 0.36–3.74)

## 2016-07-07 MED ORDER — DILTIAZEM ER 120 MG 24 HR CAP
120 mg | Freq: Every day | ORAL | Status: DC
Start: 2016-07-07 — End: 2016-07-12
  Administered 2016-07-08 – 2016-07-12 (×5): via ORAL

## 2016-07-07 MED ORDER — LORAZEPAM 2 MG/ML IJ SOLN
2 mg/mL | INTRAMUSCULAR | Status: AC
Start: 2016-07-07 — End: 2016-07-07
  Administered 2016-07-07: 09:00:00

## 2016-07-07 MED ORDER — DILTIAZEM HCL 5 MG/ML IV SOLN
5 mg/mL | Freq: Once | INTRAVENOUS | Status: AC
Start: 2016-07-07 — End: 2016-07-07
  Administered 2016-07-07: 06:00:00 via INTRAVENOUS

## 2016-07-07 MED ORDER — SODIUM CHLORIDE 0.9 % IV PIGGY BACK
INTRAVENOUS | Status: AC
Start: 2016-07-07 — End: 2016-07-07
  Administered 2016-07-07: 07:00:00

## 2016-07-07 MED ORDER — DILTIAZEM 100 MG IV POWDER FOR SOLUTION
100 mg | INTRAVENOUS | Status: AC
Start: 2016-07-07 — End: 2016-07-07
  Administered 2016-07-07: 07:00:00

## 2016-07-07 MED ORDER — ALBUTEROL SULFATE 1.25 MG/3 ML (0.042 %) SOLN FOR INHALATION
1.25 mg/3 mL | Freq: Four times a day (QID) | RESPIRATORY_TRACT | Status: DC | PRN
Start: 2016-07-07 — End: 2016-07-12

## 2016-07-07 MED ORDER — MORPHINE 2 MG/ML INJECTION
2 mg/mL | Freq: Once | INTRAMUSCULAR | Status: AC
Start: 2016-07-07 — End: 2016-07-07
  Administered 2016-07-07: 06:00:00 via INTRAVENOUS

## 2016-07-07 MED ORDER — BUDESONIDE 0.5 MG/2 ML NEB SUSPENSION
0.5 mg/2 mL | Freq: Two times a day (BID) | RESPIRATORY_TRACT | Status: DC
Start: 2016-07-07 — End: 2016-07-12
  Administered 2016-07-08 – 2016-07-12 (×10): via RESPIRATORY_TRACT

## 2016-07-07 MED ORDER — DILTIAZEM HCL 5 MG/ML IV SOLN
5 mg/mL | INTRAVENOUS | Status: AC
Start: 2016-07-07 — End: 2016-07-07
  Administered 2016-07-07: 06:00:00

## 2016-07-07 MED ORDER — ADENOSINE 3 MG/ML IV SOLN
3 mg/mL | Freq: Once | INTRAVENOUS | Status: AC
Start: 2016-07-07 — End: 2016-07-07
  Administered 2016-07-07: 08:00:00 via INTRAVENOUS

## 2016-07-07 MED ORDER — VANCOMYCIN 10 GRAM IV SOLR
10 gram | Freq: Two times a day (BID) | INTRAVENOUS | Status: DC
Start: 2016-07-07 — End: 2016-07-08
  Administered 2016-07-07: 18:00:00 via INTRAVENOUS

## 2016-07-07 MED ORDER — METOPROLOL TARTRATE 5 MG/5 ML IV SOLN
5 mg/ mL | INTRAVENOUS | Status: AC
Start: 2016-07-07 — End: 2016-07-07

## 2016-07-07 MED ORDER — MORPHINE 2 MG/ML INJECTION
2 mg/mL | INTRAMUSCULAR | Status: AC
Start: 2016-07-07 — End: 2016-07-07
  Administered 2016-07-07: 06:00:00

## 2016-07-07 MED ORDER — SODIUM CHLORIDE 0.9 % IV PIGGY BACK
100 mg | INTRAVENOUS | Status: DC
Start: 2016-07-07 — End: 2016-07-07

## 2016-07-07 MED ORDER — SODIUM CHLORIDE 0.9 % IV PIGGY BACK
100 mg | INTRAVENOUS | Status: DC
Start: 2016-07-07 — End: 2016-07-07
  Administered 2016-07-07 (×4): via INTRAVENOUS

## 2016-07-07 MED ORDER — LORAZEPAM 2 MG/ML IJ SOLN
2 mg/mL | Freq: Once | INTRAMUSCULAR | Status: AC
Start: 2016-07-07 — End: 2016-07-07
  Administered 2016-07-07: 08:00:00 via INTRAVENOUS

## 2016-07-07 MED FILL — ZOLPIDEM 5 MG TAB: 5 mg | ORAL | Qty: 2

## 2016-07-07 MED FILL — LYRICA 75 MG CAPSULE: 75 mg | ORAL | Qty: 1

## 2016-07-07 MED FILL — VANCOMYCIN 10 GRAM IV SOLR: 10 gram | INTRAVENOUS | Qty: 1500

## 2016-07-07 MED FILL — PANTOPRAZOLE 40 MG TAB, DELAYED RELEASE: 40 mg | ORAL | Qty: 1

## 2016-07-07 MED FILL — LORAZEPAM 2 MG/ML IJ SOLN: 2 mg/mL | INTRAMUSCULAR | Qty: 1

## 2016-07-07 MED FILL — VANCOMYCIN 1,000 MG IV SOLR: 1000 mg | INTRAVENOUS | Qty: 1000

## 2016-07-07 MED FILL — CYMBALTA 30 MG CAPSULE,DELAYED RELEASE: 30 mg | ORAL | Qty: 1

## 2016-07-07 MED FILL — MORPHINE 2 MG/ML INJECTION: 2 mg/mL | INTRAMUSCULAR | Qty: 1

## 2016-07-07 MED FILL — DILTIAZEM 100 MG IV POWDER FOR SOLUTION: 100 mg | INTRAVENOUS | Qty: 100

## 2016-07-07 MED FILL — XARELTO 20 MG TABLET: 20 mg | ORAL | Qty: 1

## 2016-07-07 MED FILL — INSULIN LISPRO 100 UNIT/ML INJECTION: 100 unit/mL | SUBCUTANEOUS | Qty: 1

## 2016-07-07 MED FILL — OXYCODONE-ACETAMINOPHEN 10 MG-325 MG TAB: 10-325 mg | ORAL | Qty: 1

## 2016-07-07 MED FILL — METOPROLOL TARTRATE 5 MG/5 ML IV SOLN: 5 mg/ mL | INTRAVENOUS | Qty: 5

## 2016-07-07 MED FILL — ADENOSINE 3 MG/ML IV SOLN: 3 mg/mL | INTRAVENOUS | Qty: 2

## 2016-07-07 MED FILL — PIPERACILLIN-TAZOBACTAM 3.375 GRAM IV SOLR: 3.375 gram | INTRAVENOUS | Qty: 3.38

## 2016-07-07 MED FILL — OXYBUTYNIN CHLORIDE SR 5 MG 24 HR TAB: 5 mg | ORAL | Qty: 2

## 2016-07-07 MED FILL — DILTIAZEM HCL 5 MG/ML IV SOLN: 5 mg/mL | INTRAVENOUS | Qty: 5

## 2016-07-07 MED FILL — SODIUM CHLORIDE 0.9 % IV PIGGY BACK: INTRAVENOUS | Qty: 100

## 2016-07-07 NOTE — Consults (Signed)
[facility] - Consult Note    Consultation request by Lenn Sink, MD for advice/opinion related to evaluating Pneumonia    Date of  Admission: 07/05/2016  5:09 PM   Primary Care Physician:  Lenn Sink, MD     The patient was seen, examined, and independently evaluated and I agree with the below assessment and plan by Eliot Ford, PA-C with the following comments. This patient had some PSVT which resolved with adenosine presumably secondary to the stress of her underlying pulmonary infection and agree with weaning of Cardizem drip and starting oral Cardizem for now, although oral beta blockers could also be used. Will follow acutely with you.       Assessment:     Patient Active Problem List   Diagnosis Code   ??? Depression F32.9   ??? Anxiety F41.9   ??? Insomnia G47.00   ??? Mild intermittent asthma without complication J45.20   ??? Chronic pain of right knee M25.561, G89.29   ??? Type 2 diabetes mellitus without complication (HCC) E11.9   ??? Essential hypertension with goal blood pressure less than 140/90 I10   ??? Intractable migraine without aura and without status migrainosus G43.019   ??? Hx of spinal fusion Z98.1   ??? Atypical chest pain R07.89   ??? Acute pulmonary embolism (HCC) I26.99   ??? Pulmonary emboli (HCC) I26.99   ??? Hypokalemia E87.6   ??? Ataxia R27.0   ??? Altered mental status R41.82   ??? Encephalopathy acute G93.40   ??? History of lumbar fusion, L4-sacrum Z98.890   ??? Encounter for long-term (current) use of medications Z79.899   ??? Post laminectomy syndrome M96.1   ??? 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 History of depression Z86.59   ??? History of anxiety Z86.59   ??? 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 Essential hypertension I10   ??? Dyslipidemia E78.5   ??? Type 2 diabetes mellitus without complication, without long-term current use of insulin (HCC) E11.9   ??? Community acquired pneumonia J18.9   ??? Rhabdomyolysis M62.82   ??? Pneumonia J18.9       -Pneumonia failed  outpatient treatment.  -SVT, new diagnosis, broke with adenosine, likely driven by underlying pulmonary process.  -Hypokalemia, replacing.  -H/o Asthma.  -H/o pulmonary embolism, on Xarelto.  -Hypertension.  -Hyperlipidemia.  -Diabetes mellitus.   -Normal LV function EF 60% by echo this admission.     Plan:     Will start PO cardizem and wean off cardizem drip.  Would continue treatment of underlying pulmonary process.  Continue to follow electrolytes and replace as needed.     History of Present Illness:     This is a 65 y.o. female admitted for Pneumonia.      Patient complains of:   SOB and cough. Patient is a 65 year old female who has had recent cough and SOB. She was diagnosed with PNA in ER and sent home. She continued to feel worse so she came back to ER and is admitted for PNA. She is now improving. Cardiology is asked to comment as patient was found to have SVT which broke with adenosine. Currently in sinus tach with HR low 100s on cardizem drip.  Patient was symptomatic during episode with palpitations. Patient denies previous episodes. Patient denies CP. Patient has chronic orthopnea. Patient denies LEE.      Cardiac risk factors:       Review of Symptoms:   Constitutional: negative  for fevers and chills  Eyes: negative for visual disturbance  Ears, nose, mouth, throat, and face: negative for nasal congestion  Respiratory: positive for cough  Cardiovascular: negative for chest pain  Gastrointestinal: negative for vomiting and diarrhea  Genitourinary:negative for dysuria  Hematologic/lymphatic: negative for bleeding  Musculoskeletal:negative for muscle weakness  Neurological: negative for dizziness     Past Medical History:     Past Medical History:   Diagnosis Date   ??? Anxiety    ??? Asthma    ??? 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 Cardiac echocardiogram 11/07/2015    EF 60%.  No RWMA.  Gr 1 DDfx.  RVSP 30 mmHg.     ??? Cardiovascular lower extremity venous duplex 11/06/2015    No DVT  bilaterally.   ??? Chronic kidney disease    ??? Depression    ??? Developmental delay    ??? Diabetes (HCC)    ??? Diabetes mellitus (HCC)    ??? History of nuclear stress test 03/2016    No ischemia, no infarct, EF 67%.  Low risk study   ??? Hypertension    ??? Insomnia    ??? Osteoporosis    ??? Pneumonia 2010   ??? Thromboembolus [facility])          Social History:     Social History     Social History   ??? Marital status: SINGLE     Spouse name: N/A   ??? Number of children: N/A   ??? Years of education: N/A     Social History Main Topics   ??? Smoking status: Never Smoker   ??? Smokeless tobacco: Never Used   ??? Alcohol use Yes      Comment: on occasion   ??? Drug use: No   ??? Sexual activity: Not Asked     Other Topics Concern   ??? None     Social History Narrative        Family History:     Family History   Problem Relation Age of Onset   ??? Diabetes Mother    ??? Diabetes Brother    ??? Cancer Sister    ??? Cancer Maternal Grandmother    ??? Cancer Daughter         Medications:     Allergies   Allergen Reactions   ??? 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   ??? Almond Other (comments)     Tongue itches   ??? Carvedilol Other (comments)   ??? Cozaar [Losartan] Cough   ??? Crestor [Rosuvastatin] Unknown (comments)     Heart beats fast        Current Facility-Administered Medications   Medication Dose Route Frequency   ??? morphine 2 mg/mL injection       ??? metoprolol (LOPRESSOR) 5 mg/5 mL injection       ??? dilTIAZem (CARDIZEM) 5 mg/mL injection       ??? dilTIAZem (CARDIZEM) 100 mg in 0.9% sodium chloride (MBP/ADV) 100 mL infusion  15 mg/hr IntraVENous CONTINUOUS   ??? dilTIAZem (CARDIZEM) 100 mg       ??? 0.9% sodium chloride (MBP/ADV) 0.9 % infusion       ??? LORazepam (ATIVAN) 2 mg/mL injection       ??? vancomycin (VANCOCIN) 1,500 mg in 0.9% sodium chloride 500 mL IVPB  1,500 mg IntraVENous Q12H   ??? budesonide (PULMICORT) 500 mcg/2 ml nebulizer suspension  500 mcg Nebulization BID RT   ???  albuterol (ACCUNEB) nebulizer solution 1.25 mg  1.25 mg Nebulization  Q6H PRN   ??? tiotropium (SPIRIVA) inhalation capsule 18 mcg  1 Cap Inhalation DAILY   ??? piperacillin-tazobactam (ZOSYN) 3.375 g in 0.9% sodium chloride (MBP/ADV) 100 mL MBP  3.375 g IntraVENous Q6H   ??? azelastine (ASTELIN) 174mcg/spray nasal spray  1 Spray Both Nostrils BID   ??? DULoxetine (CYMBALTA) capsule 30 mg  30 mg Oral DAILY   ??? hydrALAZINE (APRESOLINE) tablet 25 mg  25 mg Oral TID PRN   ??? oxybutynin chloride XL (DITROPAN XL) tablet 10 mg  10 mg Oral DAILY   ??? oxyCODONE-acetaminophen (PERCOCET 10) 10-325 mg per tablet 1 Tab  1 Tab Oral Q6H PRN   ??? pregabalin (LYRICA) capsule 75 mg  75 mg Oral TID   ??? rivaroxaban (XARELTO) tablet 20 mg  20 mg Oral DAILY   ??? zolpidem (AMBIEN) tablet 10 mg  10 mg Oral QHS PRN   ??? insulin lispro (HUMALOG) injection   SubCUTAneous AC&HS   ??? pantoprazole (PROTONIX) tablet 40 mg  40 mg Oral ACB         Physical Exam:     Visit Vitals   ??? BP 118/79 (BP 1 Location: Right arm, BP Patient Position: At rest)   ??? Pulse 100   ??? Temp 98.7 ??F (37.1 ??C)   ??? Resp 20   ??? Ht  (1.626 m)   ??? Wt 83.1 kg (183 lb 3.2 oz)   ??? SpO2 94%   ??? Breastfeeding No   ??? BMI 31.45 kg/m2     BP Readings from Last 3 Encounters:   07/07/16 118/79   07/03/16 127/88   07/02/16 140/84     Pulse Readings from Last 3 Encounters:   07/07/16 100   07/03/16 (!) 101   07/02/16 (!) 104     Wt Readings from Last 3 Encounters:   07/07/16 83.1 kg (183 lb 3.2 oz)   07/02/16 78.5 kg (173 lb)   06/01/16 81.8 kg (180 lb 6.4 oz)       General:  alert, cooperative, no distress, appears stated age  Neck:  no JVD  Lungs:  clear to auscultation bilaterally  Heart:  regular rate and rhythm  Abdomen:  abdomen is soft without significant tenderness, masses, organomegaly or guarding  Extremities:  extremities normal, atraumatic, no cyanosis or edema  Skin: Warm and dry. no hyperpigmentation, vitiligo, or suspicious lesions  Neuro: alert, oriented x3, affect appropriate  Psych: non focal     Data Review:     Recent Labs      07/07/16    0200  07/06/16   0236  07/05/16   1730   WBC  6.6  7.0  9.1   HGB  9.6*  9.2*  10.7*   HCT  30.3*  29.1*  33.2*   PLT  312  299  336     Recent Labs      07/07/16   0200  07/06/16   0236  07/05/16   1730   NA  138  139  136   K  3.8  3.2*  3.0*   CL  104  104  97*   CO2  GLU  159*  113*  118*   BUN  CREA  0.50*  0.52*  0.78   CA  9.2  8.3*  9.9   MG   --  1.9  2.0   ALB   --   2.3*   --    SGOT   --   36   --    ALT   --   26   --    INR   --    --   1.7*       Results for orders placed or performed during the hospital encounter of 07/05/16   EKG, 12 LEAD, INITIAL   Result Value Ref Range    Ventricular Rate 97 BPM    Atrial Rate 97 BPM    P-R Interval 140 ms    QRS Duration 70 ms    Q-T Interval 382 ms    QTC Calculation (Bezet) 485 ms    Calculated P Axis 63 degrees    Calculated R Axis 1 degrees    Calculated T Axis 36 degrees    Diagnosis       Normal sinus rhythm  Normal ECG  When compared with ECG of 05-Jul-2016 17:25,  No significant change was found  Confirmed by Fenton Foy MD [id number]), editor Rolan Bucco [phone]) on   07/06/2016 6:29:12 AM     Results for orders placed or performed in visit on 04/27/16   AMB POC EKG ROUTINE W/ 12 LEADS, INTER & REP    Impression    See progress note.       All Cardiac Markers in the last 24 hours:    Lab Results   Component Value Date/Time    CPK 543 (H) 07/07/2016 02:00 AM    CKMB <1.0 07/07/2016 02:00 AM    CKND1  07/07/2016 02:00 AM     CALCULATION NOT PERFORMED WHEN RESULT IS BELOW LINEAR LIMIT    TROIQ 0.02 07/07/2016 02:00 AM       Last Lipid:    Lab Results   Component Value Date/Time    Cholesterol, total 113 07/06/2016 02:36 AM    HDL Cholesterol 21 07/06/2016 02:36 AM    LDL, calculated 62.2 07/06/2016 02:36 AM    Triglyceride 149 07/06/2016 02:36 AM    CHOL/HDL Ratio 5.4 07/06/2016 02:36 AM       Signed By: Tiney Rouge, PA     July 07, 2016

## 2016-07-07 NOTE — Progress Notes (Addendum)
[facility]  Pulmonary, Critical Care, and Sleep Medicine    Pulmonary Progress Note    Name: Cynthia Marsh MRN: [id number]   DOB: [birth year] Hospital: [facility]   Date: 07/07/2016          Subjective:    Patient is a 65 y.o. female who presented to the ED with complaints of shortness of breath and pneumonia and lower back pain who was admitted. CTA done, negative for PE. Patient started on abx for pneumonia.     07/07/16  ?? Patient laying bed partially on her side, dozed off while finishing lunch.   ?? Patient stated that her breathing is better and that her back also feels better as well.  ?? Patient was rapid responded x 2 last night for SVT. Was given adenosine and Cardizem. She is now on a Cardizem gtt. Currently heart rate is controlled. EKG during event negative for A-fib. Cardiology consulted and is to see patient today.   ?? Afebrile, Normotensive  ?? Denies nausea, vomiting, dizziness, headache.    Past Medical History:   Diagnosis Date   ??? Anxiety    ??? Asthma    ??? 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 Cardiac echocardiogram 11/07/2015    EF 60%.  No RWMA.  Gr 1 DDfx.  RVSP 30 mmHg.     ??? Cardiovascular lower extremity venous duplex 11/06/2015    No DVT bilaterally.   ??? Chronic kidney disease    ??? Depression    ??? Developmental delay    ??? Diabetes (HCC)    ??? Diabetes mellitus (HCC)    ??? History of nuclear stress test 03/2016    No ischemia, no infarct, EF 67%.  Low risk study   ??? Hypertension    ??? Insomnia    ??? Osteoporosis    ??? Pneumonia 2010   ??? Thromboembolus [facility])       Past Surgical History:   Procedure Laterality Date   ??? BREAST SURGERY PROCEDURE UNLISTED       Left cyst removed    ??? HAND/FINGER SURGERY UNLISTED     ??? HX BACK SURGERY  2012    L4/L5/S1 fused in [place]   ??? HX CYST INCISION AND DRAINAGE Left    ??? HX CYST REMOVAL      calf    ??? HX GYN      complete hyst    ??? HX GYN      tubal ligation   ??? HX HYSTERECTOMY     ??? HX KNEE ARTHROSCOPY      ??? HX OOPHORECTOMY     ??? HX ORTHOPAEDIC      Right shoulder rotator cuff    ??? HX ORTHOPAEDIC      Left Little toe hammer toe, left big toe bunion removed   ??? HX ORTHOPAEDIC      Right knee torn meniscus   ??? HX ORTHOPAEDIC      CTS bilateral , Left thumb trigger finger    ??? HX WRIST FRACTURE TX     ??? LAMINECTOMY,CERVICAL     ??? PR ANESTH,SURGERY OF SHOULDER        Prior to Admission medications    Medication Sig Start Date End Date Taking? Authorizing Provider   levoFLOXacin (LEVAQUIN) 750 mg tablet Take 1 Tab by mouth daily for 10 days. 07/03/16 07/13/16  Garlon Hatchet, MD   cyclobenzaprine (FLEXERIL) 5 mg tablet Take 1 Tab by  mouth nightly. 07/02/16   Darren Anselm Pancoast, PA   oxyCODONE-acetaminophen (PERCOCET 10) 10-325 mg per tablet Take  by mouth.    Historical Provider   tiotropium bromide (SPIRIVA RESPIMAT) 1.25 mcg/actuation inhaler Take 2 Puffs by inhalation daily.    Historical Provider   fluticasone (FLOVENT DISKUS) 100 mcg/actuation dsdv Take  by inhalation.    Historical Provider   meloxicam (MOBIC) 15 mg tablet Take 1 Tab by mouth daily. 05/06/16   Sharen Hones, PA-C   trimethoprim-polymyxin b (POLYTRIM) ophthalmic solution Administer 1 Drop to both eyes every four (4) hours. 05/06/16   Earl Gala, PA-C   hydrALAZINE (APRESOLINE) 25 mg tablet Take 25 mg by mouth two (2) times a day.    Historical Provider   budesonide-formoterol (SYMBICORT) 80-4.5 mcg/actuation HFAA inhaler Take 2 Puffs by inhalation two (2) times a day.    Historical Provider   ALPRAZolam (XANAX) 0.5 mg tablet TK 1 T PO BID.  MAXIMUM 2 TS D. 01/07/16   Historical Provider   FREESTYLE LITE STRIPS strip USE TO CHECK BLOOD SUGAR BID 03/08/16   Historical Provider   busPIRone (BUSPAR) 15 mg tablet TK 1 T PO TID 03/18/16   Historical Provider   FREESTYLE LANCETS 28 gauge misc CHECK BLOOD SUGAR BID 03/08/16   Historical Provider   JANUMET 50-1,000 mg per tablet TK 1 T PO BID. STOP TAKING METFORMIN. 03/17/16   Historical Provider    pregabalin (LYRICA) 75 mg capsule Take 1 Cap by mouth three (3) times daily. Max Daily Amount: 225 mg. 03/29/16   Harriett Rush, MD   XARELTO 20 mg tab tablet Take 1 Tab by mouth daily. 03/07/16   Historical Provider   amLODIPine (NORVASC) 10 mg tablet Take 1 Tab by mouth daily. 02/07/16   Historical Provider   azelastine (ASTELIN) 137 mcg (0.1 %) nasal spray 1 Spray by Both Nostrils route two (2) times a day. 01/06/16   Historical Provider   DULoxetine (CYMBALTA) 30 mg capsule Take 1 Cap by mouth daily. 03/09/16   Historical Provider   fluticasone (FLONASE) 50 mcg/actuation nasal spray 2 Sprays by Both Nostrils route nightly. 02/16/16   Historical Provider   hydroCHLOROthiazide (HYDRODIURIL) 25 mg tablet Take 1 Tab by mouth daily. 01/02/16   Historical Provider   mirtazapine (REMERON) 30 mg tablet Take 1 Tab by mouth daily. 03/09/16   Historical Provider   montelukast (SINGULAIR) 10 mg tablet Take 1 Tab by mouth daily. 02/16/16   Historical Provider   omeprazole (PRILOSEC) 20 mg capsule Take 1 Cap by mouth daily. 02/02/16   Historical Provider   oxybutynin chloride XL (DITROPAN XL) 10 mg CR tablet Take 1 Tab by mouth daily. 03/08/16   Historical Provider   pravastatin (PRAVACHOL) 40 mg tablet Take 1 Tab by mouth daily. 02/07/16   Historical Provider   zolpidem (AMBIEN) 10 mg tablet Take 1 Tab by mouth nightly. 03/09/16   Historical Provider     Allergies   Allergen Reactions   ??? 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   ??? Almond Other (comments)     Tongue itches   ??? Carvedilol Other (comments)   ??? Cozaar [Losartan] Cough   ??? Crestor [Rosuvastatin] Unknown (comments)     Heart beats fast      Social History   Substance Use Topics   ??? Smoking status: Never Smoker   ??? Smokeless tobacco: Never Used   ??? Alcohol use Yes  Comment: on occasion      Family History   Problem Relation Age of Onset   ??? Diabetes Mother    ??? Diabetes Brother    ??? Cancer Sister    ??? Cancer Maternal Grandmother    ??? Cancer Daughter        Current Facility-Administered Medications   Medication Dose Route Frequency   ??? morphine 2 mg/mL injection       ??? metoprolol (LOPRESSOR) 5 mg/5 mL injection       ??? dilTIAZem (CARDIZEM) 5 mg/mL injection       ??? dilTIAZem (CARDIZEM) 100 mg in 0.9% sodium chloride (MBP/ADV) 100 mL infusion  15 mg/hr IntraVENous CONTINUOUS   ??? dilTIAZem (CARDIZEM) 100 mg       ??? 0.9% sodium chloride (MBP/ADV) 0.9 % infusion       ??? LORazepam (ATIVAN) 2 mg/mL injection       ??? vancomycin (VANCOCIN) 1,500 mg in 0.9% sodium chloride 500 mL IVPB  1,500 mg IntraVENous Q12H   ??? tiotropium (SPIRIVA) inhalation capsule 18 mcg  1 Cap Inhalation DAILY   ??? piperacillin-tazobactam (ZOSYN) 3.375 g in 0.9% sodium chloride (MBP/ADV) 100 mL MBP  3.375 g IntraVENous Q6H   ??? azelastine (ASTELIN) 155mcg/spray nasal spray  1 Spray Both Nostrils BID   ??? budesonide-formoterol (SYMBICORT) 80-4.5 mcg inhaler  2 Puff Inhalation BID RT   ??? DULoxetine (CYMBALTA) capsule 30 mg  30 mg Oral DAILY   ??? oxybutynin chloride XL (DITROPAN XL) tablet 10 mg  10 mg Oral DAILY   ??? pregabalin (LYRICA) capsule 75 mg  75 mg Oral TID   ??? rivaroxaban (XARELTO) tablet 20 mg  20 mg Oral DAILY   ??? insulin lispro (HUMALOG) injection   SubCUTAneous AC&HS   ??? pantoprazole (PROTONIX) tablet 40 mg  40 mg Oral ACB       Review of Systems:  Pertinent items are noted in HPI.    Objective:   Vital Signs:    Visit Vitals   ??? BP 118/79 (BP 1 Location: Right arm, BP Patient Position: At rest)   ??? Pulse 100   ??? Temp 98.7 ??F (37.1 ??C)   ??? Resp 20   ??? Ht 5\' 4"  (1.626 m)   ??? Wt 83.1 kg (183 lb 3.2 oz)   ??? SpO2 94%   ??? Breastfeeding No   ??? BMI 31.45 kg/m2       O2 Device: Nasal cannula   O2 Flow Rate (L/min): 3 l/min   Temp (24hrs), Avg:98.5 ??F (36.9 ??C), Min:97.1 ??F (36.2 ??C), Max:99.9 ??F (37.7 ??C)       Intake/Output:   Last shift:      08/03 0701 - 08/03 1900  In: 360 [P.O.:360]  Out: 200 [Urine:200]  Last 3 shifts: 08/01 1901 - 08/03 0700  In: 37773.9 [P.O.:37060; I.V.:713.9]   Out: 1450 [Urine:1450]    Intake/Output Summary (Last 24 hours) at 07/07/16 1226  Last data filed at 07/07/16 1113   Gross per 24 hour   Intake         37573.92 ml   Output              900 ml   Net         36673.92 ml      Physical Exam:   General:  Alert, cooperative, no distress, appears stated age.   Head:  Normocephalic, without obvious abnormality, atraumatic.   Eyes:  Conjunctivae/corneas clear. PERRL, EOMs intact.  Nose: Nares normal. Mucosa normal. No drainage or sinus tenderness.   Throat: Lips, mucosa, and tongue normal. Teeth and gums normal.   Neck: Supple, symmetrical, trachea midline, no adenopathy, thyroid: no enlargment/tenderness/nodules, no carotid bruit and no JVD.       Lungs:   Diminished lung sounds noted in lower right base, no wheezing or rales. Good airway entry, no dyspnea.    Chest wall:  No tenderness or deformity.   Heart:  Regular rate and rhythm, S1, S2 normal, no murmur, click, rub or gallop.   Abdomen:   Soft, non-tender. Bowel sounds normal. No masses,  No organomegaly.   Extremities: Extremities normal, atraumatic, no cyanosis. + trace edema.   Pulses: 2+ and symmetric all extremities.   Skin: Skin color, texture, turgor normal. No rashes or lesions       Neurologic: Grossly nonfocal     Data review:     Recent Results (from the past 24 hour(s))   GLUCOSE, POC    Collection Time: 07/06/16  5:11 PM   Result Value Ref Range    Glucose (POC) 191 (H) 70 - 110 mg/dL   GLUCOSE, POC    Collection Time: 07/06/16  8:57 PM   Result Value Ref Range    Glucose (POC) 151 (H) 70 - 110 mg/dL   EKG, 12 LEAD, SUBSEQUENT    Collection Time: 07/07/16  1:31 AM   Result Value Ref Range    Ventricular Rate 172 BPM    Atrial Rate 87 BPM    QRS Duration 72 ms    Q-T Interval 270 ms    QTC Calculation (Bezet) 456 ms    Calculated R Axis 7 degrees    Calculated T Axis 31 degrees    Diagnosis       Supraventricular tachycardia  Septal infarct , age undetermined  Abnormal ECG   When compared with ECG of 05-Jul-2016 19:58,  Vent. rate has increased BY  75 BPM  Septal infarct is now present     GLUCOSE, POC    Collection Time: 07/07/16  1:46 AM   Result Value Ref Range    Glucose (POC) 156 (H) 70 - 110 mg/dL   CBC W/O DIFF    Collection Time: 07/07/16  2:00 AM   Result Value Ref Range    WBC 6.6 4.6 - 13.2 K/uL    RBC 3.70 (L) 4.20 - 5.30 M/uL    HGB 9.6 (L) 12.0 - 16.0 g/dL    HCT 09.8 (L) 11.9 - 45.0 %    MCV 81.9 74.0 - 97.0 FL    MCH 25.9 24.0 - 34.0 PG    MCHC 31.7 31.0 - 37.0 g/dL    RDW 14.7 (H) 82.9 - 14.5 %    PLATELET 312 135 - 420 K/uL    MPV 9.2 9.2 - 11.8 FL   METABOLIC PANEL, BASIC    Collection Time: 07/07/16  2:00 AM   Result Value Ref Range    Sodium 138 136 - 145 mmol/L    Potassium 3.8 3.5 - 5.5 mmol/L    Chloride 104 100 - 108 mmol/L    CO2 26 21 - 32 mmol/L    Anion gap 8 3.0 - 18 mmol/L    Glucose 159 (H) 74 - 99 mg/dL    BUN 11 7.0 - 18 MG/DL    Creatinine 5.62 (L) 0.6 - 1.3 MG/DL    BUN/Creatinine ratio 22 (H) 12 - 20      GFR  est AA >60 >60 ml/min/1.12m2    GFR est non-AA >60 >60 ml/min/1.56m2    Calcium 9.2 8.5 - 10.1 MG/DL   CARDIAC PANEL,(CK, CKMB & TROPONIN)    Collection Time: 07/07/16  2:00 AM   Result Value Ref Range    CK 543 (H) 26 - 192 U/L    CK - MB <1.0 <3.6 ng/ml    CK-MB Index  0.0 - 4.0 %     CALCULATION NOT PERFORMED WHEN RESULT IS BELOW LINEAR LIMIT    Troponin-I, Qt. 0.02 0.0 - 0.045 NG/ML   TSH 3RD GENERATION    Collection Time: 07/07/16  2:00 AM   Result Value Ref Range    TSH 1.62 0.36 - 3.74 uIU/mL   EKG, 12 LEAD, SUBSEQUENT    Collection Time: 07/07/16  4:12 AM   Result Value Ref Range    Ventricular Rate 177 BPM    Atrial Rate 208 BPM    QRS Duration 74 ms    Q-T Interval 274 ms    QTC Calculation (Bezet) 470 ms    Calculated R Axis 8 degrees    Calculated T Axis 23 degrees    Diagnosis       Supraventricular tachycardia  Nonspecific ST abnormality  Abnormal ECG  When compared with ECG of 07-Jul-2016 01:31,   Criteria for Septal infarct are no longer present     GLUCOSE, POC    Collection Time: 07/07/16  7:21 AM   Result Value Ref Range    Glucose (POC) 163 (H) 70 - 110 mg/dL   VANCOMYCIN, TROUGH    Collection Time: 07/07/16 10:00 AM   Result Value Ref Range    Vancomycin,trough 8.6 (L) 10.0 - 20.0 ug/mL    Reported dose date: [id number]      Reported dose time: 2200      Reported dose: 1000 MG UNITS   GLUCOSE, POC    Collection Time: 07/07/16 11:56 AM   Result Value Ref Range    Glucose (POC) 198 (H) 70 - 110 mg/dL     Results for GENIEVE, RAMASWAMY (MRN [id number]) as of 07/06/2016 20:52   Ref. Range 11/06/2015 10:32 11/06/2015 14:24 07/05/2016 17:30 07/05/2016 22:32 07/06/2016 02:36   CK Latest Ref Range: 26 - 192 U/L 46 39 1272 (H) 909 (H) 867 (H)     Results for AVNOOR, KOURY (MRN [id number]) as of 07/06/2016 20:52   Ref. Range 02/02/2016 13:15 02/03/2016 06:22 07/03/2016 11:23 07/05/2016 17:30 07/06/2016 02:36   HGB Latest Ref Range: 12.0 - 16.0 g/dL 44.0 10.2 (L) 72.5 36.6 (L) 9.2 (L)       Imaging:  I have personally reviewed the patient???s radiographs and have reviewed the reports:  CXR Results  (Last 48 hours)               07/07/16 0611  XR CHEST PORT Final result    Impression:  Impression:  Low lung volume chest radiograph with bibasilar atelectasis, stable   on the right, probably improved slightly on the left.               Narrative:  Description:  Portable upright chest radiograph single view       Clinical Indication:  Follow-up pneumonia       Comparison: July 05, 2016.       Findings:  Low lung volume radiograph. Streaky opacities at the right lung base   are stable. There appears to been some improved aeration at the left lung  base.   Blunting of the right costophrenic angle noted. Cardiac silhouette mildly   enlarged, stable. Mediastinum within normal limits. No pneumothorax or   subdiaphragmatic free air.           07/05/16 1725  XR CHEST SNGL V Final result    Narrative:  TWO VIEW CHEST RADIOGRAPH         CPT CODE: 16109       INDICATION: Shortness of breath, pneumonia.       COMPARISON: 07/03/2016       FINDINGS:        Lungs are hypoinflated. Cardiomediastinal silhouette is unchanged. Pulmonary   vasculature is normal. There is increased linear densities at the right greater   than left lung bases. There may be small bilateral pleural effusions. Biapical   pleural scarring. No pneumothorax.       IMPRESSION:       Increased linear densities at the right greater than left lung bases with   suspected small pleural effusions. Morphology is more suggestive of subsegmental   atelectasis, though infection cannot be entirely excluded.             CT Results  (Last 48 hours)               07/05/16 1924  CTA CHEST W OR W WO CONT Final result    Narrative:  CT Chest Pulmonary       CPT CODE: 60454       HISTORY: Chest pain and shortness of breath. History of PE.       COMPARISON: Chest 2 views 07/05/2016. CT chest pulmonary 05/16/2016.       TECHNIQUE: 2.5 millimeter contiguous axial images from the lung apices to the   level of the adrenal glands following timed contrast bolus for maximum   intravascular enhancement of the pulmonary arteries.  Images reviewed in soft   tissue and lung windows. Coronal and sagittal MIP reformations were performed   for better evaluation of tortuous branching pulmonary vessels. Isovue-370 100 mL   IV contrast.       FINDINGS:        Suboptimal contrast bolus timing. The main pulmonary artery and lobar branches   are fairly well opacified, but the segmental and distal branches are not as well   opacified with contrast. Within this limitation there is no definite finding of   an acute pulmonary embolus. No focal filling defect within the main pulmonary   arteries or lobar branches. Some heterogeneous contrast density within the right   upper lobe and right middle lobe segmental branches, but not occlusive.       Aorta is moderately well opacified, appears normal.        Small bilateral pleural effusions. Lobar consolidation in the posterior right   lower lobe. Some subsegmental opacification in the right middle lobe. Some   linear opacities in the lingula and posterior left lower lobe likely   subsegmental atelectasis.       The heart is enlarged, similar to the prior exam. No pericardial effusion.       Small hiatal hernia. Visible portions of the liver and spleen appear normal.   Adrenals are not enlarged. Visible portions of the pancreas and kidneys   unremarkable.       IMPRESSION:       Less than optimal contrast bolus. Proximal pulmonary arteries are well   opacified, more distal vessels not as well opacified limiting evaluation for  pulmonary embolus beyond the lobar branches.       Within the limitations of the exam there is no finding of central pulmonary   embolus.       Lobar consolidation in the posterior right lower lobe consistent with pneumonia.   Right middle lobe opacity that may be atelectasis or infection.              ECHO:  Echo Results  (Last 48 hours)               07/06/16 1343  2D ECHO COMPLETE ADULT (TTE) W OR WO CONTR Final result    Narrative:  [facility]   [address]   [place], [place] [address]   [phone]       Transthoracic Echocardiogram       Patient: JARETZY LHOMMEDIEU   MRN: [id number]   ACCT #: [id number]   DOB: 1951-10-07   Age: 20 years   Gender: Female   Height: 64 in   Weight: 180.6 lb   BSA: 1.88 m-sq   BP: 101 / 71 mmHg   Study date: 06-Jul-2016   Status: Routine   Location: [facility]   DMS ACC #: [id number]       Allergies: TREE NUT, ALMOND, CARVEDILOL, LOSARTAN, ROSUVASTATIN       Referring_Ordering Physician:  Sondra Barges Hospitalist   Interpreting Group:  [facility]   Interpreting Physician:  Jun K. Dory Peru, MD   Technologist:  Lauralyn Primes, RCS, RDCS       IMPRESSIONS:   A clinically significant myopathic process is ruled out. There was no   significant valvular heart disease.       SUMMARY:    Procedure information: This was a technically difficult study.       Left ventricle: Systolic function was normal by visual assessment.   Ejection fraction was estimated to be 60 %. No obvious wall motion   abnormalities identified in the views obtained.       Right ventricle: Systolic pressure was mildly increased. Estimated peak   pressure was 45 mmHg.       COMPARISONS:   Comparison was made with the previous study of 07-Nov-2015. Pulmonary   artery pressure has increased from 30 mmHg to 45 mmHg.       INDICATIONS: Abnormal cardiac markers.       HISTORY: Prior history: Risk factors: hypertension, diabetes, and CKD. PE.       PROCEDURE: This was a routine study. The study included complete 2D   imaging, M-mode, complete spectral Doppler, and color Doppler. The heart   rate was 96 bpm, at the start of the study. Systolic blood pressure was   784 mmHg, at the start of the study. Diastolic blood pressure was 71 mmHg,   at the start of the study. Images were obtained from the parasternal,   apical, and subcostal acoustic windows. Echocardiographic views were   limited by poor acoustic window availability. This was a technically   difficult study.       LEFT VENTRICLE: Size was normal. Systolic function was normal by visual   assessment. Ejection fraction was estimated to be 60 %. No obvious wall   motion abnormalities identified in the views obtained. Wall thickness was   normal. DOPPLER: Left ventricular diastolic function parameters were   normal for the patient's age.       RIGHT VENTRICLE: The size was normal. Systolic function was normal.   DOPPLER: Systolic pressure was mildly  increased. Estimated peak pressure   was 45 mmHg.       LEFT ATRIUM: Size was normal. LA volume index was 22 ml/m-sq.       RIGHT ATRIUM: Size was normal.       MITRAL VALVE: There was annular calcification. Normal valve structure.   DOPPLER: There was no evidence for stenosis. There was no significant   regurgitation.        AORTIC VALVE: The valve was probably trileaflet. Leaflets exhibited good   mobility. DOPPLER: There was no stenosis. There was no significant   regurgitation.       TRICUSPID VALVE: Normal valve structure. DOPPLER: There was no evidence   for tricuspid stenosis. There was mild regurgitation. Right atrial   pressure estimate: 3 mmHg.       PULMONIC VALVE: Not well visualized, but normal Doppler findings.       AORTA: The root exhibited normal size.       SYSTEMIC VEINS: IVC: The inferior vena cava was normal in size.   Respirophasic changes were normal.       PERICARDIUM: Insignificant pericardial effusion and/or pericardial fat was   present.       SYSTEM MEASUREMENT TABLES       2D   Ao Diam: 3.04 cm   IVSd: 0.97 cm   LVIDd: 4.03 cm   LVIDs: 2.45 cm   LVPWd: 1.01 cm   LAAs A2C: 16.29 cm2   LAAs A4C: 16.92 cm2   LAESV A-L A2C: 40.51 ml   LAESV A-L A4C: 41.65 ml   LAESV Index (A-L): 22.38 ml/m2   LAESV MOD A2C: 38.65 ml   LAESV MOD A4C: 39.84 ml   LAESV(A-L): 42.07 ml   LALs A2C: 5.56 cm   LALs A4C: 5.83 cm   LVOT Diam: 1.97 cm   RA Area: 13.39 cm2   RV Minor: 3.61 cm       CW   TR Vmax: 3.23 m/s   IVRT: 94.58 ms   TR maxPG: 41.81 mmHG       MM   Tapse: 2.07 cm       PW   LVOT VTI: 19.24 cm   LVOT Vmax: 1.14 m/s   LVOT Vmean: 0.79 m/s   MV A Vel: 1 m/s   MV Dec Slope: 4.98 m/s2   MV DecT: 191.67 ms   MV E Vel: 0.96 m/s   MV E/A Ratio: 0.95   E': 0.07 m/s   E/E': 14   LVOT maxPG: 5.18 mmHG   LVOT meanPG: 2.86 mmHG   LVSI Dopp: 31.33 ml/m2   LVSV Dopp: 58.89 ml   Lateral E': 0.1 m/s   Lateral E/E': 9.71       Prepared and E-signed by       Jun K. Dory Peru, MD   Signed 06-Jul-2016 16:16:43                 IMPRESSION:   ?? Right lower lobe pneumonia- Probable CAP vs other (Strep Pneumo and Legionella Ag negative)- Failure of OP therapy with levaquin  ?? SVT 8/2-07/07/16  ?? Asthma  ?? Cough  ?? Rhabdomyolosis- mild CPK downtrending  ?? Anemia Hb: 12-->9 : no evidence of acute bleeding on Xarelto - improving today.  ?? Hypokalemia   ?? Hypoalbuminemia  Past Hx  ?? DM2  ?? HTN  ?? PE 2016 - on Xarelto  ?? Depression/Anxiety  ?? Chronic Lower Back Pain - managed by pain clinic  ?? OSA?- final  report pending      RECOMMENDATIONS:   ?? Supplemental O2 3L at this time to keep sats > 94% Titrate as tolerated to RA as patient clinically improves.  ?? Continue current IV abx Zosyn and Vancomycin for broad spectrum coverage. - Streamline based on cultures and/or at 72 hrs  ?? Obtain sputum sample if possible.  ?? Monitor for leukocytosis and fever.  ?? Broncho hygiene protocol, ICS to bedside  ?? SVT last night will change inhaler medications. D/C Symbicort (LABA/ICS) and start Pulmicort (ICS) and change PRN albuterol (SABA) to 1.25 q 6 hrs PRN. Monitor for repeat tachycardia. Continue Spiriva (inhaled anticholinergic).  ?? Patient has been seen in our clinic for a sleep study to evaluate for OSA. Recent sleep study completed. Will see if we can expedite study results.  ?? Continue anticoagulation - Xarelto. - Trend H/H monitor for bleeding.  ?? Glucose control per primary.            Jeanell Sparrow, NP     Pulmonary Staff    I have evaluated the patient. I agree with the above evaluation and assessment and recommendations. Agree with discontinuing LABA in the setting of SVT. Will attempt to expedite pend sleep study results as well.     Dennie Bible, DO

## 2016-07-07 NOTE — Other (Addendum)
Pt's HR 178 Pt's asymptomatic.pt stated "I was playing a game on my phone and it stressing me out". Tele shows SVT- EKG done and RRT called. Pt denies chest pain and SOB. O2 Sat 85% on 2 L. RT here and increased O2 to 6l- O2 Sat 96%. Pt denies SOB. C/O back pain and medicated with Morphine as ordered (See MAR)    0230 Cradizem 10mg  IV bolus given followed by Cardizem 10mg /hr IV as ordered. Will continue to monitor pt. V/S stable.    0300 Pt's HR 179 at this time.Cardizem drip changed to Cardizem 14 mg/hr as ordered. Will continue to monitor pt.

## 2016-07-07 NOTE — Other (Signed)
Bedside and Verbal shift change report given to April RN (oncoming nurse) by Tommie Ard (offgoing nurse). Report included the following information SBAR, Kardex, MAR and Recent Results.    SITUATION:   ? Code Status: Full Code  ? Reason for Admission: Pneumonia    ? Hospital day: 2  ? Problem List:       Hospital Problems  Date Reviewed: July 19, 2016          Codes Class Noted POA    Community acquired pneumonia ICD-10-CM: J18.9  ICD-9-CM: 486  07-19-16 Yes        Rhabdomyolysis ICD-10-CM: M62.82  ICD-9-CM: 728.88  07-19-16 Yes        Pneumonia ICD-10-CM: J18.9  ICD-9-CM: 486  07/19/16 Unknown        Dyslipidemia ICD-10-CM: E78.5  ICD-9-CM: 272.4  04/27/2016 Yes        Hx of spinal fusion (Chronic) ICD-10-CM: Z98.1  ICD-9-CM: V45.4  08/17/2015 Yes        Depression ICD-10-CM: F32.9  ICD-9-CM: 311  Unknown Yes        Anxiety ICD-10-CM: F41.9  ICD-9-CM: 300.00  Unknown Yes        Mild intermittent asthma without complication ICD-10-CM: J45.20  ICD-9-CM: 493.90  04/20/2015 Yes        Type 2 diabetes mellitus without complication (HCC) ICD-10-CM: E11.9  ICD-9-CM: 250.00  04/20/2015 Yes              BACKGROUND:    Past Medical History:   Past Medical History:   Diagnosis Date   ??? Anxiety    ??? Asthma    ??? 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 Cardiac echocardiogram 11/07/2015    EF 60%.  No RWMA.  Gr 1 DDfx.  RVSP 30 mmHg.     ??? Cardiovascular lower extremity venous duplex 11/06/2015    No DVT bilaterally.   ??? Chronic kidney disease    ??? Depression    ??? Developmental delay    ??? Diabetes (HCC)    ??? Diabetes mellitus (HCC)    ??? History of nuclear stress test 03/2016    No ischemia, no infarct, EF 67%.  Low risk study   ??? Hypertension    ??? Insomnia    ??? Osteoporosis    ??? Pneumonia 2010   ??? Thromboembolus [facility])          Patient taking anticoagulants yes     ASSESSMENT:   ? Changes in Assessment Throughout Shift: NONE    ? Patient has Central Line: no Reasons if yes: NA   ? Patient has Foley Cath: no Reasons if yes: NA     ? Last Vitals:     Vitals:    07/07/16 0716 07/07/16 1109 07/07/16 1541 07/07/16 1937   BP: 123/85 118/79 130/84 125/80   Pulse: (!) 102 100 92 94   Resp: 20 20 20 18    Temp: 98.3 ??F (36.8 ??C) 98.7 ??F (37.1 ??C) 99.3 ??F (37.4 ??C) 97.2 ??F (36.2 ??C)   SpO2: 95% 94% 92% 91%   Weight:       Height:           ? IV and DRAINS (will only show if present)   Peripheral IV  Left Antecubital-Site Assessment: Clean, dry, & intact  Peripheral IV 07/07/16 Left Arm-Site Assessment: Clean, dry, & intact  [REMOVED] Peripheral IV 07/19/16 Right Hand-Site Assessment: Clean, dry, & intact    ? WOUND (if present)  Wound Type:  none   Dressing present Dressing Present : No   Wound Concerns/Notes:  none    ? PAIN    Pain Assessment    Pain Intensity 1: 7 (07/07/16 1950)    Pain Location 1: Hip    Pain Intervention(s) 1: Medication (see MAR)    Patient Stated Pain Goal: 0  o Interventions for Pain:  SEE MAR  o Intervention effective: yes  o Time of last intervention: SEE MAR   o Reassessment Completed: yes     ? Last 3 Weights:  Last 3 Recorded Weights in this Encounter    07/05/16 1710 07/06/16 0400 07/07/16 0653   Weight: 78.5 kg (173 lb) 82.5 kg (181 lb 14.4 oz) 83.1 kg (183 lb 3.2 oz)     Weight change: 4.627 kg (10 lb 3.2 oz)    ? INTAKE/OUPUT    Current Shift:      Last three shifts: 08/02 0701 - 08/03 1900  In: 37933.9 [P.O.:37420; I.V.:513.9]  Out: 1350 [Urine:1350]    ? LAB RESULTS     Recent Labs      07/07/16   0200  07/06/16   0236  07/05/16   1730   WBC  6.6  7.0  9.1   HGB  9.6*  9.2*  10.7*   HCT  30.3*  29.1*  33.2*   PLT  312  299  336        Recent Labs      07/07/16   0200  07/06/16   0236  07/05/16   1730   NA  138  139  136   K  3.8  3.2*  3.0*   GLU  159*  113*  118*   BUN  CREA  0.50*  0.52*  0.78   CA  9.2  8.3*  9.9   MG   --   1.9  2.0   INR   --    --   1.7*       RECOMMENDATIONS AND DISCHARGE PLANNING      1. Pending tests/procedures/ Plan of Care or Other Needs: NONE     2. Discharge plan for patient and Needs/Barriers: NONE    3. Estimated Discharge Date: PENDING Posted on Whiteboard in Patient???s Room: no      4. The patient's care plan was reviewed with the oncoming nurse.       "HEALS" SAFETY CHECK      Fall Risk    Total Score: 3    Safety Measures: Safety Measures: Bed/Chair-Wheels locked, Bed in low position, Call light within reach, Fall prevention (comment), Gripper socks, Side rails X 3    A safety check occurred in the patient's room between off going nurse and oncoming nurse listed above.    The safety check included the below items  Area Items   H  High Alert Medications ? Verify all high alert medication drips (heparin, PCA, etc.)   E  Equipment ? Suction is set up for ALL patients (with yanker)  ? Red plugs utilized for all equipment (IV pumps, etc.)  ? WOW???s wiped down at end of shift.  ? Room stocked with oxygen, suction, and other unit-specific supplies   A  Alarms ? Bed alarm is set for fall risk patients  ? Ensure chair alarm is in place and activated if patient is up in a chair   L  Lines ? Check IV  for any infiltration  ? Foley bag is empty if patient has a Foley   ? Tubing and IV bags are labeled   S  Safety   ? Room is clean, patient is clean, and equipment is clean.  ? Hallways are clear from equipment besides carts.   ? Fall bracelet on for fall risk patients  ? Ensure room is clear and free of clutter  ? Suction is set up for ALL patients (with yanker)  ? Hallways are clear from equipment besides carts.   ? Isolation precautions followed, supplies available outside room, sign posted     Tommie Ard

## 2016-07-07 NOTE — Other (Signed)
Bedside shift change report given to Jasmine RN (oncoming nurse) by J Marcelo (offgoing nurse).  Report given with SBAR, Kardex, Intake/Output, MAR and Recent Results.

## 2016-07-07 NOTE — Progress Notes (Addendum)
Pt with RRT this AM. To be seen by cardiology this AM. Will await cardiology recs prior to PT treatment in order to follow up as medically appropriate.    1212: Spoke with [facility] RN who stated pt has been stable for her, reported pt had 2 RRTs this AM due to supraventricular tachycardia. Jasmine RN stated pt just received pain meds. Entered room to discuss with patient, pt drowsy, barely able to hold eyes open, likely due to pain meds. Cardiology recs remain not noted at this time.    Will hold today and follow up tomorrow.    Thank you.  Philemon Kingdom PT, DPT

## 2016-07-07 NOTE — Consults (Addendum)
[facility] - Consult Note    Consultation request by Lenn Sink, MD for advice/opinion related to evaluating Pneumonia    Date of  Admission: 07/05/2016  5:09 PM   Primary Care Physician:  Lenn Sink, MD     The patient was seen, examined, and independently evaluated and I agree with the below assessment and plan by Eliot Ford, PA-C with the following comments. This patient had some PSVT which resolved with adenosine presumably secondary to the stress of her underlying pulmonary infection and agree with weaning of Cardizem drip and starting oral Cardizem for now, although oral beta blockers could also be used. Will follow acutely with you.       Assessment:     Patient Active Problem List   Diagnosis Code   ??? Depression F32.9   ??? Anxiety F41.9   ??? Insomnia G47.00   ??? Mild intermittent asthma without complication J45.20   ??? Chronic pain of right knee M25.561, G89.29   ??? Type 2 diabetes mellitus without complication (HCC) E11.9   ??? Essential hypertension with goal blood pressure less than 140/90 I10   ??? Intractable migraine without aura and without status migrainosus G43.019   ??? Hx of spinal fusion Z98.1   ??? Atypical chest pain R07.89   ??? Acute pulmonary embolism (HCC) I26.99   ??? Pulmonary emboli (HCC) I26.99   ??? Hypokalemia E87.6   ??? Ataxia R27.0   ??? Altered mental status R41.82   ??? Encephalopathy acute G93.40   ??? History of lumbar fusion, L4-sacrum Z98.890   ??? Encounter for long-term (current) use of medications Z79.899   ??? Post laminectomy syndrome M96.1   ??? 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 History of depression Z86.59   ??? History of anxiety Z86.59   ??? 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 Essential hypertension I10   ??? Dyslipidemia E78.5   ??? Type 2 diabetes mellitus without complication, without long-term current use of insulin (HCC) E11.9   ??? Community acquired pneumonia J18.9   ??? Rhabdomyolysis M62.82   ??? Pneumonia J18.9        -Pneumonia failed outpatient treatment.  -SVT, new diagnosis, broke with adenosine, likely driven by underlying pulmonary process.  -Hypokalemia, replacing.  -H/o Asthma.  -H/o pulmonary embolism, on Xarelto.  -Hypertension.  -Hyperlipidemia.  -Diabetes mellitus.   -Normal LV function EF 60% by echo this admission.     Plan:     Will start PO cardizem and wean off cardizem drip.  Would continue treatment of underlying pulmonary process.  Continue to follow electrolytes and replace as needed.     History of Present Illness:     This is a 65 y.o. female admitted for Pneumonia.      Patient complains of:   SOB and cough. Patient is a 65 year old female who has had recent cough and SOB. She was diagnosed with PNA in ER and sent home. She continued to feel worse so she came back to ER and is admitted for PNA. She is now improving. Cardiology is asked to comment as patient was found to have SVT which broke with adenosine. Currently in sinus tach with HR low 100s on cardizem drip.  Patient was symptomatic during episode with palpitations. Patient denies previous episodes. Patient denies CP. Patient has chronic orthopnea. Patient denies LEE.      Cardiac risk factors:       Review of Symptoms:   Constitutional: negative  for fevers and chills  Eyes: negative for visual disturbance  Ears, nose, mouth, throat, and face: negative for nasal congestion  Respiratory: positive for cough  Cardiovascular: negative for chest pain  Gastrointestinal: negative for vomiting and diarrhea  Genitourinary:negative for dysuria  Hematologic/lymphatic: negative for bleeding  Musculoskeletal:negative for muscle weakness  Neurological: negative for dizziness     Past Medical History:     Past Medical History:   Diagnosis Date   ??? Anxiety    ??? Asthma    ??? 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 Cardiac echocardiogram 11/07/2015    EF 60%.  No RWMA.  Gr 1 DDfx.  RVSP 30 mmHg.      ??? Cardiovascular lower extremity venous duplex 11/06/2015    No DVT bilaterally.   ??? Chronic kidney disease    ??? Depression    ??? Developmental delay    ??? Diabetes (HCC)    ??? Diabetes mellitus (HCC)    ??? History of nuclear stress test 03/2016    No ischemia, no infarct, EF 67%.  Low risk study   ??? Hypertension    ??? Insomnia    ??? Osteoporosis    ??? Pneumonia 2010   ??? Thromboembolus [facility])          Social History:     Social History     Social History   ??? Marital status: SINGLE     Spouse name: N/A   ??? Number of children: N/A   ??? Years of education: N/A     Social History Main Topics   ??? Smoking status: Never Smoker   ??? Smokeless tobacco: Never Used   ??? Alcohol use Yes      Comment: on occasion   ??? Drug use: No   ??? Sexual activity: Not Asked     Other Topics Concern   ??? None     Social History Narrative        Family History:     Family History   Problem Relation Age of Onset   ??? Diabetes Mother    ??? Diabetes Brother    ??? Cancer Sister    ??? Cancer Maternal Grandmother    ??? Cancer Daughter         Medications:     Allergies   Allergen Reactions   ??? 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   ??? Almond Other (comments)     Tongue itches   ??? Carvedilol Other (comments)   ??? Cozaar [Losartan] Cough   ??? Crestor [Rosuvastatin] Unknown (comments)     Heart beats fast        Current Facility-Administered Medications   Medication Dose Route Frequency   ??? morphine 2 mg/mL injection       ??? metoprolol (LOPRESSOR) 5 mg/5 mL injection       ??? dilTIAZem (CARDIZEM) 5 mg/mL injection       ??? dilTIAZem (CARDIZEM) 100 mg in 0.9% sodium chloride (MBP/ADV) 100 mL infusion  15 mg/hr IntraVENous CONTINUOUS   ??? dilTIAZem (CARDIZEM) 100 mg       ??? 0.9% sodium chloride (MBP/ADV) 0.9 % infusion       ??? LORazepam (ATIVAN) 2 mg/mL injection       ??? vancomycin (VANCOCIN) 1,500 mg in 0.9% sodium chloride 500 mL IVPB  1,500 mg IntraVENous Q12H   ??? budesonide (PULMICORT) 500 mcg/2 ml nebulizer suspension  500 mcg Nebulization BID RT    ???  albuterol (ACCUNEB) nebulizer solution 1.25 mg  1.25 mg Nebulization Q6H PRN   ??? tiotropium (SPIRIVA) inhalation capsule 18 mcg  1 Cap Inhalation DAILY   ??? piperacillin-tazobactam (ZOSYN) 3.375 g in 0.9% sodium chloride (MBP/ADV) 100 mL MBP  3.375 g IntraVENous Q6H   ??? azelastine (ASTELIN) 168mcg/spray nasal spray  1 Spray Both Nostrils BID   ??? DULoxetine (CYMBALTA) capsule 30 mg  30 mg Oral DAILY   ??? hydrALAZINE (APRESOLINE) tablet 25 mg  25 mg Oral TID PRN   ??? oxybutynin chloride XL (DITROPAN XL) tablet 10 mg  10 mg Oral DAILY   ??? oxyCODONE-acetaminophen (PERCOCET 10) 10-325 mg per tablet 1 Tab  1 Tab Oral Q6H PRN   ??? pregabalin (LYRICA) capsule 75 mg  75 mg Oral TID   ??? rivaroxaban (XARELTO) tablet 20 mg  20 mg Oral DAILY   ??? zolpidem (AMBIEN) tablet 10 mg  10 mg Oral QHS PRN   ??? insulin lispro (HUMALOG) injection   SubCUTAneous AC&HS   ??? pantoprazole (PROTONIX) tablet 40 mg  40 mg Oral ACB         Physical Exam:     Visit Vitals   ??? BP 118/79 (BP 1 Location: Right arm, BP Patient Position: At rest)   ??? Pulse 100   ??? Temp 98.7 ??F (37.1 ??C)   ??? Resp 20   ??? Ht 5\' 4"  (1.626 m)   ??? Wt 83.1 kg (183 lb 3.2 oz)   ??? SpO2 94%   ??? Breastfeeding No   ??? BMI 31.45 kg/m2     BP Readings from Last 3 Encounters:   07/07/16 118/79   07/03/16 127/88   07/02/16 140/84     Pulse Readings from Last 3 Encounters:   07/07/16 100   07/03/16 (!) 101   07/02/16 (!) 104     Wt Readings from Last 3 Encounters:   07/07/16 83.1 kg (183 lb 3.2 oz)   07/02/16 78.5 kg (173 lb)   06/01/16 81.8 kg (180 lb 6.4 oz)       General:  alert, cooperative, no distress, appears stated age  Neck:  no JVD  Lungs:  clear to auscultation bilaterally  Heart:  regular rate and rhythm  Abdomen:  abdomen is soft without significant tenderness, masses, organomegaly or guarding  Extremities:  extremities normal, atraumatic, no cyanosis or edema  Skin: Warm and dry. no hyperpigmentation, vitiligo, or suspicious lesions   Neuro: alert, oriented x3, affect appropriate  Psych: non focal     Data Review:     Recent Labs      07/07/16   0200  07/06/16   0236  07/05/16   1730   WBC  6.6  7.0  9.1   HGB  9.6*  9.2*  10.7*   HCT  30.3*  29.1*  33.2*   PLT  312  299  336     Recent Labs      07/07/16   0200  07/06/16   0236  07/05/16   1730   NA  138  139  136   K  3.8  3.2*  3.0*   CL  104  104  97*   CO2  26  25  28    GLU  159*  113*  118*   BUN  11  11  15    CREA  0.50*  0.52*  0.78   CA  9.2  8.3*  9.9   MG   --  1.9  2.0   ALB   --   2.3*   --    SGOT   --   36   --    ALT   --   26   --    INR   --    --   1.7*       Results for orders placed or performed during the hospital encounter of 07/05/16   EKG, 12 LEAD, INITIAL   Result Value Ref Range    Ventricular Rate 97 BPM    Atrial Rate 97 BPM    P-R Interval 140 ms    QRS Duration 70 ms    Q-T Interval 382 ms    QTC Calculation (Bezet) 485 ms    Calculated P Axis 63 degrees    Calculated R Axis 1 degrees    Calculated T Axis 36 degrees    Diagnosis       Normal sinus rhythm  Normal ECG  When compared with ECG of 05-Jul-2016 17:25,  No significant change was found  Confirmed by Fenton Foy MD ([id number]), editor Rolan Bucco [phone]) on   07/06/2016 6:29:12 AM     Results for orders placed or performed in visit on 04/27/16   AMB POC EKG ROUTINE W/ 12 LEADS, INTER & REP    Impression    See progress note.       All Cardiac Markers in the last 24 hours:    Lab Results   Component Value Date/Time    CPK 543 (H) 07/07/2016 02:00 AM    CKMB <1.0 07/07/2016 02:00 AM    CKND1  07/07/2016 02:00 AM     CALCULATION NOT PERFORMED WHEN RESULT IS BELOW LINEAR LIMIT    TROIQ 0.02 07/07/2016 02:00 AM       Last Lipid:    Lab Results   Component Value Date/Time    Cholesterol, total 113 07/06/2016 02:36 AM    HDL Cholesterol 21 07/06/2016 02:36 AM    LDL, calculated 62.2 07/06/2016 02:36 AM    Triglyceride 149 07/06/2016 02:36 AM    CHOL/HDL Ratio 5.4 07/06/2016 02:36 AM        Signed By: Tiney Rouge, PA     July 07, 2016

## 2016-07-07 NOTE — Progress Notes (Addendum)
HR 180 Tele shows SVT pt's asymptomatic.EKG done. Notified Dr Nada Libman and RRT called.Ativan given as ordered followed by Adenosine 6mg  given by ICU RN. Tele shows coverted to ST 115. Will continue to monitor pt.

## 2016-07-07 NOTE — Progress Notes (Addendum)
Rapid Response Note  [facility]    Patient: Cynthia Marsh 65 y.o. female  [id number]  02-Jun-1951      Admit Date: 07/05/2016   Admission Diagnosis: Pneumonia    RAPID RESPONSE     Rapid response called for tachycardia Cynthia Marsh 170s at 01:39. The patient was seen and evaluated at bedside. The patient was asymptomatic and had no complaints except for her back pain, which she is chronic for her. According to the patient, her heart rate tends to go very high when she is Cynthia Marsh a pain. Her vitals reveals SpO2 Cynthia Marsh the upper 90s, her blood pressure was within normal range with SBP Cynthia Marsh 110s. Her heart rate was consistently Cynthia Marsh the 160-170s. The patient was assess for carotid bruit and vasovagal maneuver with carotid massage were attempted without any response Cynthia Marsh the heart rate. The patient was subsequently given morphine for her pain and also Cardizem for the heart rate. Her heart rate decreased to 80s for few seconds, but climbed back into 150-170s. The decision was made to start cardizem ggt and her primary care team physician, Dr. Nada Libman, was contacted for further instruction.    Medications Reviewed    Review of Systems   Constitutional: Positive for chills. Negative for diaphoresis and fever.   Eyes: Negative for blurred vision and double vision.   Respiratory: Negative for shortness of breath.    Cardiovascular: Negative for chest pain, palpitations and leg swelling.   Neurological: Negative for dizziness, tingling, weakness and headaches.        OBJECTIVE     Visit Vitals   ??? BP 105/71   ??? Pulse (!) 174   ??? Temp 97.6 ??F (36.4 ??C)   ??? Resp 18   ??? Ht 5\' 4"  (1.626 m)   ??? Wt 82.5 kg (181 lb 14.4 oz)   ??? SpO2 95%   ??? Breastfeeding No   ??? BMI 31.22 kg/m2       Physical Exam   Constitutional: She is oriented to person, place, and time. She appears well-developed and well-nourished.   HENT:   Head: Normocephalic and atraumatic.   Eyes: EOM are normal. Pupils are equal, round, and reactive to light.    Neck: Normal range of motion. Neck supple.   No carotid bruit appreciated bilaterally   Cardiovascular: Regular rhythm.    Tachycardic   Pulmonary/Chest: Effort normal and breath sounds normal. No respiratory distress.   Abdominal: Soft. Bowel sounds are normal.   Musculoskeletal: She exhibits no edema.   Neurological: She is alert and oriented to person, place, and time.   Skin: Skin is warm and dry.   Psychiatric: She has a normal mood and affect.       Medications administered: Cardizem 10mg  IV, morphine 5mg  IV    EKG: Sinus tachycardia. No evidence of acute ischemia. No ST elevation or depression. Nonspecific T wave changes.    Labs: CBC, BMP, Mg, cardiac panel    ASSESSMENT, PLAN & DISPOSITION   Cynthia Marsh is a 65 y.o. year old female admitted for Pneumonia. Rapid response called for tachycardia. Patient condition currently: stable.    Sustained tachycardia- The patient was given Cardizem Cynthia Marsh lieu of beta blockers as she reported cough and worsening asthma symptoms with beta blockers Cynthia Marsh the past. The cardizem worked momentarily so decision was made to start Cardizem drip   - Continue Cardizem drip 10mg /hr. With HR goal 60-100   - Consult cardiology Cynthia Marsh the AM   - Control  pain per current pain management regimen   - FU with labs    Disposition: Per primary team    Attending Dr. Nada Libman notified of rapid response. Cynthia Marsh agreement with plan. Primary team resuming care.       Cynthia Mackie Deshea Pooley, MD  07/07/16 2:17 AM        Addendum    Another rapid response was called at 03:59 for sustained tachycardia. The patient was asymptomatic and her pain was well controlled, however, her HR was still Cynthia Marsh 170s while at /hr Cardizem drip. The patient's BP was within normal limits and she was satting well Cynthia Marsh 90s on room air. After discussion with Dr. Nada Libman, the patient was given Ativan  prior to getting  Adenosine. The patient was monitored through the telemetry  monitoring during the process, and after the administration of Adenosine, her HR was brought down to 110s and has been sustaining at that rate Perian Tedder sinus rhythm. The patient will be monitored and will continue with Cardizem drip at /hr

## 2016-07-07 NOTE — Progress Notes (Addendum)
Progress Note    Patient: Cynthia Marsh MRN: [id number]  CSN: [id number]    Date of Birth: June 03, 1951  Age: 65 y.o.  Sex: female    DOA: 07/05/2016 LOS:  LOS: 2 days                    Subjective:   Overnight patient had RRT for SVT with HR 180s, patient was asymptomatic with normal BP during the episode, started on diltiazem drip that initially helped but then resumed HR 170s, given adenosine 6mg  x1 after pre-treatment with ativan and HR has been 90-110s since without further SVT.  Cardiology consulted.    Cynthia Marsh reports feeling tired this am.  Otherwise no acute complaints.    Chief Complaint:   Chief Complaint   Patient presents with   ??? Shortness of Breath   ??? Chest Pain       Review of systems  General: No fevers or chills.  Cardiovascular: No chest pain or pressure. No palpitations.   Pulmonary: No shortness of breath.  +cough.   Gastrointestinal: No abdominal pain, nausea, vomiting or diarrhea.   Genitourinary: No urinary frequency, urgency, hesitancy or dysuria.   Musculoskeletal: No joint or muscle pain, no back pain, no recent trauma.    Neurologic: No headache, numbness, tingling or weakness.     Objective:     Physical Exam:  Visit Vitals   ??? BP 118/79 (BP 1 Location: Right arm, BP Patient Position: At rest)   ??? Pulse 100   ??? Temp 98.7 ??F (37.1 ??C)   ??? Resp 20   ??? Ht 5\' 4"  (1.626 m)   ??? Wt 83.1 kg (183 lb 3.2 oz)   ??? SpO2 94%   ??? Breastfeeding No   ??? BMI 31.45 kg/m2        General:         Alert, cooperative, no acute distress????  HEENT: NC, Atraumatic.  PERRLA, anicteric sclerae.  Lungs: Rales bilateral lung fields, most notably RLL.  Heart:  Regular  rhythm,?? No murmur, No Rubs, No Gallops, slightly tachycardic.  Abdomen: Soft, Non distended, Non tender. ??+Bowel sounds, no HSM  Extremities: No c/c/e  Psych:???? Good insight.??Not anxious or agitated.  Neurologic:?? CN 2-12 grossly intact, Alert and oriented X 3.  No acute neurological                                 Deficits,     Intake and Output:   Current Shift:  08/03 0701 - 08/03 1900  In: 360 [P.O.:360]  Out: 200 [Urine:200]  Last three shifts:  08/01 1901 - 08/03 0700  In: 37773.9 [P.O.:37060; I.V.:713.9]  Out: 1450 [Urine:1450]    Labs: Results:       Chemistry Recent Labs      07/07/16   0200  07/06/16   0236  07/05/16   1730   GLU  159*  113*  118*   NA  138  139  136   K  3.8  3.2*  3.0*   CL  104  104  97*   CO2  26  25  28    BUN  11  11  15    CREA  0.50*  0.52*  0.78   CA  9.2  8.3*  9.9   AGAP  8  10  11    BUCR  22*  21*  19   AP   --  59   --    TP   --   6.4   --    ALB   --   2.3*   --    GLOB   --   4.1*   --    AGRAT   --   0.6*   --       CBC w/Diff Recent Labs      07/07/16   0200  07/06/16   0236  07/05/16   1730   WBC  6.6  7.0  9.1   RBC  3.70*  3.53*  4.12*   HGB  9.6*  9.2*  10.7*   HCT  30.3*  29.1*  33.2*   PLT  312  299  336   GRANS   --   62  70   LYMPH   --   21  15*   EOS   --   1  0      Cardiac Enzymes Recent Labs      07/07/16   0200  07/06/16   0236   CPK  543*  867*   CKND1  CALCULATION NOT PERFORMED WHEN RESULT IS BELOW LINEAR LIMIT  0.3      Coagulation Recent Labs      07/05/16   1730   PTP  19.4*   INR  1.7*   APTT  47.6*       Lipid Panel Lab Results   Component Value Date/Time    Cholesterol, total 113 07/06/2016 02:36 AM    HDL Cholesterol 21 07/06/2016 02:36 AM    LDL, calculated 62.2 07/06/2016 02:36 AM    VLDL, calculated 29.8 07/06/2016 02:36 AM    Triglyceride 149 07/06/2016 02:36 AM    CHOL/HDL Ratio 5.4 07/06/2016 02:36 AM      BNP No results for input(s): BNPP in the last 72 hours.   Liver Enzymes Recent Labs      07/06/16   0236   TP  6.4   ALB  2.3*   AP  59   SGOT  36      Thyroid Studies Lab Results   Component Value Date/Time    TSH 1.62 07/07/2016 02:00 AM          Procedures/imaging: see electronic medical records for all procedures/Xrays and details which were not copied into this note but were reviewed prior to creation of Plan    Medications:   Current Facility-Administered Medications    Medication Dose Route Frequency   ??? morphine 2 mg/mL injection       ??? metoprolol (LOPRESSOR) 5 mg/5 mL injection       ??? dilTIAZem (CARDIZEM) 5 mg/mL injection       ??? dilTIAZem (CARDIZEM) 100 mg in 0.9% sodium chloride (MBP/ADV) 100 mL infusion  15 mg/hr IntraVENous CONTINUOUS   ??? dilTIAZem (CARDIZEM) 100 mg       ??? 0.9% sodium chloride (MBP/ADV) 0.9 % infusion       ??? LORazepam (ATIVAN) 2 mg/mL injection       ??? vancomycin (VANCOCIN) 1,500 mg in 0.9% sodium chloride 500 mL IVPB  1,500 mg IntraVENous Q12H   ??? Vancomycin Trough Due   1 Each Other Rx Dosing/Monitoring   ??? tiotropium (SPIRIVA) inhalation capsule 18 mcg  1 Cap Inhalation DAILY   ??? albuterol (PROVENTIL VENTOLIN) nebulizer solution 2.5 mg  2.5 mg Nebulization Q4H PRN   ??? piperacillin-tazobactam (ZOSYN) 3.375 g in 0.9% sodium chloride (MBP/ADV) 100 mL  MBP  3.375 g IntraVENous Q6H   ??? azelastine (ASTELIN) 145mcg/spray nasal spray  1 Spray Both Nostrils BID   ??? budesonide-formoterol (SYMBICORT) 80-4.5 mcg inhaler  2 Puff Inhalation BID RT   ??? DULoxetine (CYMBALTA) capsule 30 mg  30 mg Oral DAILY   ??? hydrALAZINE (APRESOLINE) tablet 25 mg  25 mg Oral TID PRN   ??? oxybutynin chloride XL (DITROPAN XL) tablet 10 mg  10 mg Oral DAILY   ??? oxyCODONE-acetaminophen (PERCOCET 10) 10-325 mg per tablet 1 Tab  1 Tab Oral Q6H PRN   ??? pregabalin (LYRICA) capsule 75 mg  75 mg Oral TID   ??? rivaroxaban (XARELTO) tablet 20 mg  20 mg Oral DAILY   ??? zolpidem (AMBIEN) tablet 10 mg  10 mg Oral QHS PRN   ??? insulin lispro (HUMALOG) injection   SubCUTAneous AC&HS   ??? pantoprazole (PROTONIX) tablet 40 mg  40 mg Oral ACB       Assessment/Plan     Active Problems:    Depression ()      Anxiety ()      Mild intermittent asthma without complication (04/20/2015)      Type 2 diabetes mellitus without complication (HCC) (04/20/2015)      Hx of spinal fusion (08/17/2015)      Dyslipidemia (04/27/2016)      Community acquired pneumonia (07/05/2016)      Rhabdomyolysis (07/05/2016)       Pneumonia (07/05/2016)      Plan:  ??   Community Acquired pneumonia failed outpatient treatment with PO levaquin  Continue Zosyn and vancomycin (8/2-  Check respiratory culture and blood culture is pending  Urine strept Ag and Legionella AG negative  Albuterol nebulizer q 4h prn  Swallow evaluation  Pulmonary consult, Dr. Benjamine Sprague, appreciated, will f/u recommendations  ??  SVT  Resolved, now sinus tachycardia  F/u echo  Appreciate cardiology consult, will follow up recommendations. Continue diltiazem pending cardiology recommendations.  Discussed with PA Amy Barco, patient sees Dr. Joannie Springs.    Asthma  Symbicort 2 puffs BID  Albuterol nebulizer q 4 h prn  ??  ??  Rhabdomyolysis   Mild, Improved with hydration in the ER  Holding Pravachol  Lipids at goal, would recommend continue pravachol when acute illness resolves  Check echo  ??  Hypokalemia  Resolved with supplementation, monitor and repleate as needed  ??  UTI  Follow up culture, likely covered by zosyn  ??  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  Hypertension  Hydralazine prn for SBP above 160  Hold Norvasc and HCTZ, resume if bp sustained in hypertensive range  ??  Hyperlipidemia  Hold Pravachol as above  ??  Diabetes Mellitus type 2  Sliding scale coverage  Check HG A1c  Holding janumet, resume on 8/5 (72hrs after last IV contrast load -8/2 CTA)  ??    Pt and ot evaluation and treatment  DVT/GI Prophylaxis: H2B/PPI and Xarelto and protonix      Rowan Blase, MD  07/07/2016 11:51 AM

## 2016-07-07 NOTE — Progress Notes (Signed)
.  Respiratory Care Assessment for Bronchial hygiene or Lung Expansion Therapy  Patient  Cynthia Marsh     65 y.o.   female     07/07/2016  4:25 PM  Patient Active Problem List   Diagnosis Code   ??? Depression F32.9   ??? Anxiety F41.9   ??? Insomnia G47.00   ??? Mild intermittent asthma without complication J45.20   ??? Chronic pain of right knee M25.561, G89.29   ??? Type 2 diabetes mellitus without complication (HCC) E11.9   ??? Essential hypertension with goal blood pressure less than 140/90 I10   ??? Intractable migraine without aura and without status migrainosus G43.019   ??? Hx of spinal fusion Z98.1   ??? Atypical chest pain R07.89   ??? Acute pulmonary embolism (HCC) I26.99   ??? Pulmonary emboli (HCC) I26.99   ??? Hypokalemia E87.6   ??? Ataxia R27.0   ??? Altered mental status R41.82   ??? Encephalopathy acute G93.40   ??? History of lumbar fusion, L4-sacrum Z98.890   ??? Encounter for long-term (current) use of medications Z79.899   ??? Post laminectomy syndrome M96.1   ??? 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 History of depression Z86.59   ??? History of anxiety Z86.59   ??? 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 Essential hypertension I10   ??? Dyslipidemia E78.5   ??? Type 2 diabetes mellitus without complication, without long-term current use of insulin (HCC) E11.9   ??? Community acquired pneumonia J18.9   ??? Rhabdomyolysis M62.82   ??? Pneumonia J18.9       ABG:  Date:07/07/2016  No results found for: PH, PHI, PCO2, PCO2I, PO2, PO2I, HCO3, HCO3I, FIO2, FIO2I    Chest X-ray:  Date:07/07/2016    Results from Hospital Encounter encounter on 07/05/16   XR CHEST PORT   Narrative Description:  Portable upright chest radiograph single view    Clinical Indication:  Follow-up pneumonia    Comparison: July 05, 2016.    Findings:  Low lung volume radiograph. Streaky opacities at the right lung base  are stable. There appears to been some improved aeration at the left lung base.   Blunting of the right costophrenic angle noted. Cardiac silhouette mildly  enlarged, stable. Mediastinum within normal limits. No pneumothorax or  subdiaphragmatic free air.         Impression Impression:  Low lung volume chest radiograph with bibasilar atelectasis, stable  on the right, probably improved slightly on the left.            Lab Test:  Date:07/07/2016  WBC:   Lab Results   Component Value Date/Time    WBC 6.6 07/07/2016 02:00 AM   HGB: Lab Results   Component Value Date/Time    HGB 9.6 07/07/2016 02:00 AM    PLTS: Lab Results   Component Value Date/Time    PLATELET 312 07/07/2016 02:00 AM       SaO2%/flow:   SpO2 Readings from Last 1 Encounters:   07/07/16 92%       Vital Signs:   Patient Vitals for the past 8 hrs:   Temp Pulse Resp BP SpO2   07/07/16 1541 99.3 ??F (37.4 ??C) 92 20 130/84 92 %   07/07/16 1109 98.7 ??F (37.1 ??C) 100 20 118/79 94 %         RA/O2 flow/device: 3 LPM NC      First Inital Assesment:     Yes No  Reevaluation/Reassessment:    Yes No     CHART REVIEW   Points 0 X 1 X 2 X 3 X 4 X Points   Pulmonary History Smoking History (1) none  Recent Smoking History <1 PPD  Recent Smoking History >1 PPD  Pulmonary Disease or Impairment  Severe Pulmonary Disease  3   Surgical History No Surgery  General Surgery  Lower Abdominal  Thoracic or Upper Abdominal  Thoracic & Pulmonary Disease  0   CXR Clear or not indicated  Chronic changes or CXR Pending  Infiltrate, atelectasis or pleural effusions  Infiltrates in more than 1lobe  Infiltrates +atelectasis  +/or pleural effusions  2     PATIENT ASSESSMENT    0 X 1 X 2 X 3 X 4 X Points   Respiratory Pattern Regular pattern RR 12-20  Increased RR 21-27   Mild Dyspnea at rest, irregular pattern RR 28-32  Moderate Dyspnea at rest, Use of accessory muscles, RR 33-36  Severe Dyspnea, Use of accessory muscles RR >36  0   Mental Status Alert Oriented cooperative  Confused, Follows commands  Lethargic, Does not follow commands  Obtunded  Unresponsive  0    Breath Sounds Clear  Decreased Unilaterally  Decreased Bilaterally  Mild Scattered wheezing or Crackles in bases  Severe Wheezing or rhonchi  0   Cough Strong dry NPC  Strong Productive  Weak NPC  Weak productive or weak with rhonchi  No cough or may require suctioning  0   Level of Activity Ambulatory  Ambulatory with assistance  Temporarily Non-ambulatory  Non-Ambulatory, able to position self  Unable to position self, confined to bed  1   Total Points/Score: 6     Specific Intervention Chart()    Bronchial Hygiene/Secretion Clearance:    EZPAP Rotation bed with vibration    CPT with percussor CPT via vest   Oscillastiang positive pressure expiratory device      Lung Expansion:    Facilities manager w/RT visits Incentive Spirometer w/nursing    EZPAP      *Suctioning:    Nasal Tracheal Tracheal     *suctioning will be ordered and done PRN with an associated frequency such as QID/PRN based on score       Other:    Care Plan   Level # Score Modality Frequency Comment   Level 1 >17      Level 2 14-17      Level 3 10-13      Level 4 1-9        BRONCHIAL HYGIENE SCORING AND FREQUENCY GUIDELINES   Frequency Indications/Findings Level #   Q4 ATC Copious secretions, SOB, unable to sleep 1   QID & PRN at night Moderate amounts of secretions 2   TID or Q6 wa Small amounts of secretions and poor cough: recent history of secretions 3   BID or Q8 wa Unable to deep breathe and cough effectively 4        Comments:  IS with RN    Respiratory Therapist: Adonis Huguenin, RT

## 2016-07-07 NOTE — Progress Notes (Signed)
OT orders received and chart reviewed. Patient had an RRT this morning and awaiting cardiology recommendation. Will hold until medically stable. Thank you.    Ann Maki OTR/L

## 2016-07-08 LAB — METABOLIC PANEL, BASIC
Anion gap: 7 mmol/L (ref 3.0–18)
BUN/Creatinine ratio: 23 — ABNORMAL HIGH (ref 12–20)
BUN: 11 MG/DL (ref 7.0–18)
CO2: 28 mmol/L (ref 21–32)
Calcium: 8.9 MG/DL (ref 8.5–10.1)
Chloride: 106 mmol/L (ref 100–108)
Creatinine: 0.47 MG/DL — ABNORMAL LOW (ref 0.6–1.3)
GFR est AA: >60 mL/min/{1.73_m2} (ref >60)
GFR est non-AA: >60 mL/min/{1.73_m2} (ref >60)
Glucose: 140 mg/dL — ABNORMAL HIGH (ref 74–99)
Potassium: 3.7 mmol/L (ref 3.5–5.5)
Sodium: 141 mmol/L (ref 136–145)

## 2016-07-08 LAB — GLUCOSE, POC
Glucose (POC): 160 mg/dL — ABNORMAL HIGH (ref 70–110)
Glucose (POC): 138 mg/dL — ABNORMAL HIGH (ref 70–110)
Glucose (POC): 161 mg/dL — ABNORMAL HIGH (ref 70–110)
Glucose (POC): 71 mg/dL (ref 70–110)

## 2016-07-08 LAB — PHOSPHORUS: Phosphorus: 3.6 MG/DL (ref 2.5–4.9)

## 2016-07-08 MED ORDER — VANCOMYCIN 10 GRAM IV SOLR
10 gram | Freq: Two times a day (BID) | INTRAVENOUS | Status: DC
Start: 2016-07-08 — End: 2016-07-10
  Administered 2016-07-08 – 2016-07-10 (×4): via INTRAVENOUS

## 2016-07-08 MED ORDER — PHARMACY VANCOMYCIN NOTE
Freq: Once | Status: DC
Start: 2016-07-08 — End: 2016-07-08

## 2016-07-08 MED ORDER — PHARMACY VANCOMYCIN NOTE
Freq: Once | Status: DC
Start: 2016-07-08 — End: 2016-07-10

## 2016-07-08 MED FILL — OXYBUTYNIN CHLORIDE SR 5 MG 24 HR TAB: 5 mg | ORAL | Qty: 2

## 2016-07-08 MED FILL — VANCOMYCIN 1,000 MG IV SOLR: 1000 mg | INTRAVENOUS | Qty: 1500

## 2016-07-08 MED FILL — LYRICA 75 MG CAPSULE: 75 mg | ORAL | Qty: 1

## 2016-07-08 MED FILL — PANTOPRAZOLE 40 MG TAB, DELAYED RELEASE: 40 mg | ORAL | Qty: 1

## 2016-07-08 MED FILL — OXYCODONE-ACETAMINOPHEN 10 MG-325 MG TAB: 10-325 mg | ORAL | Qty: 1

## 2016-07-08 MED FILL — VANCOMYCIN 10 GRAM IV SOLR: 10 gram | INTRAVENOUS | Qty: 1500

## 2016-07-08 MED FILL — BUDESONIDE 0.5 MG/2 ML NEB SUSPENSION: 0.5 mg/2 mL | RESPIRATORY_TRACT | Qty: 1

## 2016-07-08 MED FILL — DILTIAZEM ER 120 MG 24 HR CAP: 120 mg | ORAL | Qty: 1

## 2016-07-08 MED FILL — PHARMACY VANCOMYCIN NOTE: Qty: 1

## 2016-07-08 MED FILL — PIPERACILLIN-TAZOBACTAM 3.375 GRAM IV SOLR: 3.375 gram | INTRAVENOUS | Qty: 3.38

## 2016-07-08 MED FILL — XARELTO 20 MG TABLET: 20 mg | ORAL | Qty: 1

## 2016-07-08 MED FILL — CYMBALTA 30 MG CAPSULE,DELAYED RELEASE: 30 mg | ORAL | Qty: 1

## 2016-07-08 MED FILL — INSULIN LISPRO 100 UNIT/ML INJECTION: 100 unit/mL | SUBCUTANEOUS | Qty: 1

## 2016-07-08 NOTE — Progress Notes (Signed)
[facility] - Progress Note  Admit Date: 07/15/16    Assessment:     Hospital Problems  Date Reviewed: 07-15-2016          Codes Class Noted POA    Community acquired pneumonia ICD-10-CM: J18.9  ICD-9-CM: 486  Jul 15, 2016 Yes        Rhabdomyolysis ICD-10-CM: M62.82  ICD-9-CM: 728.88  Jul 15, 2016 Yes        Pneumonia ICD-10-CM: J18.9  ICD-9-CM: 486  15-Jul-2016 Unknown        Dyslipidemia ICD-10-CM: E78.5  ICD-9-CM: 272.4  04/27/2016 Yes        Hx of spinal fusion (Chronic) ICD-10-CM: Z98.1  ICD-9-CM: V45.4  08/17/2015 Yes        Depression ICD-10-CM: F32.9  ICD-9-CM: 311  Unknown Yes        Anxiety ICD-10-CM: F41.9  ICD-9-CM: 300.00  Unknown Yes        Mild intermittent asthma without complication ICD-10-CM: J45.20  ICD-9-CM: 493.90  04/20/2015 Yes        Type 2 diabetes mellitus without complication (HCC) ICD-10-CM: E11.9  ICD-9-CM: 250.00  04/20/2015 Yes              ??-Pneumonia failed outpatient treatment.  -SVT, new diagnosis, broke with adenosine, likely driven by underlying pulmonary process.  -Hypokalemia, replacing.  -H/o Asthma.  -H/o pulmonary embolism, on Xarelto.  -Hypertension.  -Hyperlipidemia.  -Diabetes mellitus.   -Normal LV function EF 60% by echo this admission.    Plan:     Rhythm stable, continue cardizem.  Continue work-up and treatment of underlying infectious process.    Subjective:     No new complaints.     Objective:      Patient Vitals for the past 8 hrs:   Temp Pulse Resp BP SpO2   07/08/16 1116 98.8 ??F (37.1 ??C) 97 20 137/87 96 %   07/08/16 0717 97.8 ??F (36.6 ??C) 89 18 115/84 95 %   07/08/16 0420 97.3 ??F (36.3 ??C) 93 16 (!) 151/93 92 %         Patient Vitals for the past 96 hrs:   Weight   07/08/16 0536 86.5 kg (190 lb 9.6 oz)   07/07/16 0653 83.1 kg (183 lb 3.2 oz)   07/06/16 0400 82.5 kg (181 lb 14.4 oz)   Jul 15, 2016 1710 78.5 kg (173 lb)                    Intake/Output Summary (Last 24 hours) at 07/08/16 1215  Last data filed at 07/08/16 0909   Gross per 24 hour    Intake              240 ml   Output                0 ml   Net              240 ml       Physical Exam:  General:  alert, cooperative  Neck:  no JVD  Lungs:  rhonchi scattered  Heart:  regular rate and rhythm  Abdomen:  abdomen is soft   Extremities:  no edema    Data Review:     Labs: Results:       Chemistry Recent Labs      07/08/16   0615  07/07/16   0200  07/06/16   0236  07/15/2016   1730   GLU  140*  159*  113*  118*  NA  141  138  139  136   K  3.7  3.8  3.2*  3.0*   CL  106  104  104  97*   CO2  BUN  CREA  0.47*  0.50*  0.52*  0.78   CA  8.9  9.2  8.3*  9.9   MG   --    --   1.9  2.0   PHOS  3.6   --    --    --    AGAP  BUCR  23*  22*  21*  19   AP   --    --   59   --    TP   --    --   6.4   --    ALB   --    --   2.3*   --    GLOB   --    --   4.1*   --    AGRAT   --    --   0.6*   --       CBC w/Diff Recent Labs      07/07/16   0200  07/06/16   0236  07/05/16   1730   WBC  6.6  7.0  9.1   RBC  3.70*  3.53*  4.12*   HGB  9.6*  9.2*  10.7*   HCT  30.3*  29.1*  33.2*   PLT  312  299  336   GRANS   --   62  70   LYMPH   --   21  15*   EOS   --   1  0      Cardiac Enzymes No results found for: CPK, CKMMB, CKMB, RCK3, CKMBT, CKNDX, CKND1, MYO, TROPT, TROIQ, TROI, TROPT, TNIPOC, BNP, BNPP   Coagulation Recent Labs      07/05/16   1730   PTP  19.4*   INR  1.7*   APTT  47.6*       Lipid Panel Lab Results   Component Value Date/Time    Cholesterol, total 113 07/06/2016 02:36 AM    HDL Cholesterol 21 07/06/2016 02:36 AM    LDL, calculated 62.2 07/06/2016 02:36 AM    VLDL, calculated 29.8 07/06/2016 02:36 AM    Triglyceride 149 07/06/2016 02:36 AM    CHOL/HDL Ratio 5.4 07/06/2016 02:36 AM      BNP No results found for: BNP, BNPP, XBNPT   Liver Enzymes Recent Labs      07/06/16   0236   TP  6.4   ALB  2.3*   AP  59   SGOT  36      Digoxin    Thyroid Studies Lab Results   Component Value Date/Time    TSH 1.62 07/07/2016 02:00 AM          Signed By: Tiney Rouge, PA      July 08, 2016

## 2016-07-08 NOTE — Progress Notes (Signed)
Problem: Mobility Impaired (Adult and Pediatric)  Goal: *Acute Goals and Plan of Care (Insert Text)  STG for 8.2.17 to be completed by 8.9.17 (7 days).  1. Pt will complete supine to/from sit with S in order to complete EOB activity.  2. Pt will complete sit to/from stand transfer with RW and S in prep for ambulation.  3. Pt will ambulate 100 feet with RW and min A in order to negotiate household distances.   Outcome: Progressing Towards Goal  PHYSICAL THERAPY TREATMENT     Patient: Cynthia Marsh (65 y.o. female)  Date: 07/08/2016  Diagnosis: Pneumonia <principal problem not specified>       Precautions:  fall, O2  Chart, physical therapy assessment, plan of care and goals were reviewed.      ASSESSMENT:  Pt demonstrated increased independence with functional mobility and was able to transfer and ambulate with RW and supervision on 2L of O2 via nasal cannula.    Progression toward goals:  [X]       Improving appropriately and progressing toward goals  [ ]       Improving slowly and progressing toward goals  [ ]       Not making progress toward goals and plan of care will be adjusted       PLAN:  Patient continues to benefit from skilled intervention to address the above impairments.  Continue treatment per established plan of care.  Discharge Recommendations:  Home Health  Further Equipment Recommendations for Discharge:  rolling walker       G-CODES:      Mobility [phone] Current  CI= 1-19%.  The severity rating is based on the Level of Assistance required for Functional Mobility and ADLs.         SUBJECTIVE:   Patient stated ???I'm ready to get out of here.???      OBJECTIVE DATA SUMMARY:   Critical Behavior:  Neurologic State: Alert  Orientation Level: Oriented X4  Cognition: Follows commands, Appropriate decision making     Functional Mobility Training:  Bed Mobility:  Supine to Sit: Supervision  Sit to Supine: Supervision  Transfers:  Sit to Stand: Supervision  Stand to Sit: Supervision  Balance:   Sitting - Static: Good (unsupported)  Sitting - Dynamic: Good (unsupported)  Standing: With support  Standing - Static: Good  Standing - Dynamic : Fair  Ambulation/Gait Training:   ambulated 80 feet with RW and supervision        Pain:  Pt reports 3/10 pain or discomfort prior to treatment.    Pt reports 3/10 pain or discomfort post treatment.         Activity Tolerance:   Fair-  Please refer to the flowsheet for vital signs taken during this treatment.  After treatment:   [ ]  Patient left in no apparent distress sitting up in chair  [X]  Patient left in no apparent distress sitting edge of bed  [X]  Call bell left within reach  [X]  Nursing notified  [ ]  Caregiver present  [ ]  Bed alarm activated    Educated patient on calling for assistance to get up     Jannifer Hick, PT   Time Calculation: 8 mins

## 2016-07-08 NOTE — Progress Notes (Addendum)
Problem: Self Care Deficits Care Plan (Adult)  Goal: *Acute Goals and Plan of Care (Insert Text)  OCCUPATIONAL THERAPY EVALUATION/DISCHARGE     Patient: Cynthia Marsh (65 y.o. female)  Date: 07/08/2016  Primary Diagnosis: Pneumonia   Precautions: none noted         ASSESSMENT AND RECOMMENDATIONS:  Based on the objective data described below, the patient presents with out functional deficits. Therefore, skilled occupational therapy is not indicated at this time.  Discharge Recommendations: None  Further Equipment Recommendations for Discharge: N/A       COMPLEXITY      Eval Complexity: History: LOW Complexity : Brief history review ; Examination: LOW Complexity : 1-3 performance deficits relating to physical, cognitive , or psychosocial skils that result in activity limitations and / or participation restrictions ; Decision Making:LOW Complexity : No comorbidities that affect functional and no verbal or physical assistance needed to complete eval tasks  Assessment: LOW Complexity          G-CODES:      Self Care G8987 Current  CH= 0%  G8988 Goal  CH= 0%  G8989 D/C  CH= 0%.  The severity rating is based on the Level of Assistance required for Functional Mobility and ADLs.      SUBJECTIVE:   Patient stated ???I can do everything.???      OBJECTIVE DATA SUMMARY:       Past Medical History:   Diagnosis Date   ??? Anxiety     ??? Asthma     ??? Blood clot associated with vein wall inflammation     ??? Cardiac echocardiogram 11/07/2015     EF 60%.  No RWMA.  Gr 1 DDfx.  RVSP 30 mmHg.     ??? Cardiovascular lower extremity venous duplex 11/06/2015     No DVT bilaterally.   ??? Chronic kidney disease     ??? Depression     ??? Developmental delay     ??? Diabetes (HCC)     ??? Diabetes mellitus (HCC)     ??? History of nuclear stress test 03/2016     No ischemia, no infarct, EF 67%.  Low risk study   ??? Hypertension     ??? Insomnia     ??? Osteoporosis     ??? Pneumonia 2010   ??? Thromboembolus [facility])       Past Surgical History:    Procedure Laterality Date   ??? BREAST SURGERY PROCEDURE UNLISTED          Left cyst removed    ??? HAND/FINGER SURGERY UNLISTED       ??? HX BACK SURGERY   2012     L4/L5/S1 fused in [place]   ??? HX CYST INCISION AND DRAINAGE Left     ??? HX CYST REMOVAL         calf    ??? HX GYN         complete hyst    ??? HX GYN         tubal ligation   ??? HX HYSTERECTOMY       ??? HX KNEE ARTHROSCOPY       ??? HX OOPHORECTOMY       ??? HX ORTHOPAEDIC         Right shoulder rotator cuff    ??? HX ORTHOPAEDIC         Left Little toe hammer toe, left big toe bunion removed   ??? HX ORTHOPAEDIC  Right knee torn meniscus   ??? HX ORTHOPAEDIC         CTS bilateral , Left thumb trigger finger    ??? HX WRIST FRACTURE TX       ??? LAMINECTOMY,CERVICAL       ??? PR ANESTH,SURGERY OF SHOULDER         Prior Level of Function/Home Situation: she lives with a handicapped person who is not able to assist her; she has a Psychiatric nurse  Home Situation  Home Environment: Private residence  # Steps to Enter: 0  One/Two Story Residence: One story  Living Alone: No  Support Systems: Family member(s), Friends \\ neighbors  Patient Expects to be Discharged to:: Apartment  Current DME Used/Available at Home: Environmental consultant, rollator, Medical laboratory scientific officer, straight       Right hand dominant            Left hand dominant  Cognitive/Behavioral Status:  Neurologic State: Alert  Orientation Level: Oriented X4  Skin: no skin integrity issues noted  Edema: no extremity edema noted  Vision/Perceptual:      tracking is WFL    Coordination:   BUE's WFL   Balance:   independent standing during LB clothes management  Strength:  BUE's: 4/5   Tone & Sensation:  BUE's WFL   Range of Motion:  BUE's AROM is WFL  Functional Mobility and Transfers for ADLs:  Bed Mobility:   supine to sit and return to supine is independent   Transfers:   independent sit to stand and stepping forward/back in preparation for transfers   ADL Assessment:   self feeding: independent (simulated)  Grooming: independent (simulated)   UB bathing/dressing: independent (simulated)  LB bathing/dressing: independent   Pain:  Pt reports 0/10 pain or discomfort prior to treatment.    Pt reports 0/10 pain or discomfort post treatment.   Activity Tolerance:   No SOB noted and no c/o generalized fatigue  Please refer to the flowsheet for vital signs taken during this treatment.  After treatment:     Patient left in no apparent distress sitting up in chair    Patient left in no apparent distress in bed    Call bell left within reach    Nursing notified    Caregiver present    Bed alarm activated      COMMUNICATION/EDUCATION:   Communication/Collaboration:        Home safety education was provided and the patient/caregiver indicated understanding.        Patient/family have participated as able and agree with findings and recommendations.        Patient is unable to participate in plan of care at this time.     Melany Guernsey, OTR/L  Time Calculation: 13 mins

## 2016-07-08 NOTE — Other (Signed)
Bedside shift change report given to Lakeema, RN (oncoming nurse) by April M Roser (offgoing nurse). Report included the following information SBAR, Kardex, ED Summary, Intake/Output, MAR, Accordion, Recent Results and Med Rec Status.

## 2016-07-08 NOTE — Progress Notes (Signed)
Care Management Interventions  PCP Verified by CM: Yes (last seen "July 2017")  Mode of Transport at Discharge:  (family/friend)  Transition of Care Consult (CM Consult): Discharge Planning  Physical Therapy Consult: Yes  Occupational Therapy Consult: Yes  Speech Therapy Consult: Yes  Current Support Network: Other (lives with friend)  Confirm Follow Up Transport: Family  Plan discussed with Pt/Family/Caregiver: Yes  Discharge Location  Discharge Placement:  (TBD)    Pt is a 65 year old admitted for pneumonia. Pt is alert and oriented and alone in room. Pt reports that she lives with a friend in a 1st floor apt with no steps to enter. Pt reports that prior to admission she was independent in her ADLs and that she has a rollator at home.  PT was recommending SNF but today is recommending HH on discharge. Pt given list of SNFs in area. Pt reports that she plans to return home on discharge and that she has transportation home with family/friend.

## 2016-07-08 NOTE — Progress Notes (Addendum)
[facility]  Pulmonary, Critical Care, and Sleep Medicine    Pulmonary Progress Note    Name: Cynthia Marsh MRN: [id number]   DOB: 1951/01/15 Hospital: [facility]   Date: 07/08/2016          HPI/Subjective:   Patient is a 65 y.o. female who presented to the ED with complaints of shortness of breath and pneumonia and lower back pain who was admitted. CTA done, negative for PE. Patient started on abx for pneumonia.     07/08/16  ?? Patient is alert awake and oriented sitting up in bed. No complaints of pain, nausea.   ?? States that breathing has improved today, states that she was able to get some sleep last night as well.  ?? No further runs of SVT, LABA stopped yesterday.   ?? Afebrile, Normotensive  ?? Denies nausea, vomiting, dizziness, headache.    Past Medical History:   Diagnosis Date   ??? Anxiety    ??? Asthma    ??? 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 Cardiac echocardiogram 11/07/2015    EF 60%.  No RWMA.  Gr 1 DDfx.  RVSP 30 mmHg.     ??? Cardiovascular lower extremity venous duplex 11/06/2015    No DVT bilaterally.   ??? Chronic kidney disease    ??? Depression    ??? Developmental delay    ??? Diabetes (HCC)    ??? Diabetes mellitus (HCC)    ??? History of nuclear stress test 03/2016    No ischemia, no infarct, EF 67%.  Low risk study   ??? Hypertension    ??? Insomnia    ??? Osteoporosis    ??? Pneumonia 2010   ??? Thromboembolus [facility])       Past Surgical History:   Procedure Laterality Date   ??? BREAST SURGERY PROCEDURE UNLISTED       Left cyst removed    ??? HAND/FINGER SURGERY UNLISTED     ??? HX BACK SURGERY  2012    L4/L5/S1 fused in [place]   ??? HX CYST INCISION AND DRAINAGE Left    ??? HX CYST REMOVAL      calf    ??? HX GYN      complete hyst    ??? HX GYN      tubal ligation   ??? HX HYSTERECTOMY     ??? HX KNEE ARTHROSCOPY     ??? HX OOPHORECTOMY     ??? HX ORTHOPAEDIC      Right shoulder rotator cuff    ??? HX ORTHOPAEDIC      Left Little toe hammer toe, left big toe bunion removed    ??? HX ORTHOPAEDIC      Right knee torn meniscus   ??? HX ORTHOPAEDIC      CTS bilateral , Left thumb trigger finger    ??? HX WRIST FRACTURE TX     ??? LAMINECTOMY,CERVICAL     ??? PR ANESTH,SURGERY OF SHOULDER        Prior to Admission medications    Medication Sig Start Date End Date Taking? Authorizing Provider   levoFLOXacin (LEVAQUIN) 750 mg tablet Take 1 Tab by mouth daily for 10 days. 07/03/16 07/13/16  Garlon Hatchet, MD   cyclobenzaprine (FLEXERIL) 5 mg tablet Take 1 Tab by mouth nightly. 07/02/16   Darren Anselm Pancoast, PA   oxyCODONE-acetaminophen (PERCOCET 10) 10-325 mg per tablet Take  by mouth.    Historical Provider  tiotropium bromide (SPIRIVA RESPIMAT) 1.25 mcg/actuation inhaler Take 2 Puffs by inhalation daily.    Historical Provider   fluticasone (FLOVENT DISKUS) 100 mcg/actuation dsdv Take  by inhalation.    Historical Provider   meloxicam (MOBIC) 15 mg tablet Take 1 Tab by mouth daily. 05/06/16   Sharen Hones, PA-C   trimethoprim-polymyxin b (POLYTRIM) ophthalmic solution Administer 1 Drop to both eyes every four (4) hours. 05/06/16   Earl Gala, PA-C   hydrALAZINE (APRESOLINE) 25 mg tablet Take 25 mg by mouth two (2) times a day.    Historical Provider   budesonide-formoterol (SYMBICORT) 80-4.5 mcg/actuation HFAA inhaler Take 2 Puffs by inhalation two (2) times a day.    Historical Provider   ALPRAZolam (XANAX) 0.5 mg tablet TK 1 T PO BID.  MAXIMUM 2 TS D. 01/07/16   Historical Provider   FREESTYLE LITE STRIPS strip USE TO CHECK BLOOD SUGAR BID 03/08/16   Historical Provider   busPIRone (BUSPAR) 15 mg tablet TK 1 T PO TID 03/18/16   Historical Provider   FREESTYLE LANCETS 28 gauge misc CHECK BLOOD SUGAR BID 03/08/16   Historical Provider   JANUMET 50-1,000 mg per tablet TK 1 T PO BID. STOP TAKING METFORMIN. 03/17/16   Historical Provider   pregabalin (LYRICA) 75 mg capsule Take 1 Cap by mouth three (3) times daily. Max Daily Amount: 225 mg. 03/29/16   Harriett Rush, MD    XARELTO 20 mg tab tablet Take 1 Tab by mouth daily. 03/07/16   Historical Provider   amLODIPine (NORVASC) 10 mg tablet Take 1 Tab by mouth daily. 02/07/16   Historical Provider   azelastine (ASTELIN) 137 mcg (0.1 %) nasal spray 1 Spray by Both Nostrils route two (2) times a day. 01/06/16   Historical Provider   DULoxetine (CYMBALTA) 30 mg capsule Take 1 Cap by mouth daily. 03/09/16   Historical Provider   fluticasone (FLONASE) 50 mcg/actuation nasal spray 2 Sprays by Both Nostrils route nightly. 02/16/16   Historical Provider   hydroCHLOROthiazide (HYDRODIURIL) 25 mg tablet Take 1 Tab by mouth daily. 01/02/16   Historical Provider   mirtazapine (REMERON) 30 mg tablet Take 1 Tab by mouth daily. 03/09/16   Historical Provider   montelukast (SINGULAIR) 10 mg tablet Take 1 Tab by mouth daily. 02/16/16   Historical Provider   omeprazole (PRILOSEC) 20 mg capsule Take 1 Cap by mouth daily. 02/02/16   Historical Provider   oxybutynin chloride XL (DITROPAN XL) 10 mg CR tablet Take 1 Tab by mouth daily. 03/08/16   Historical Provider   pravastatin (PRAVACHOL) 40 mg tablet Take 1 Tab by mouth daily. 02/07/16   Historical Provider   zolpidem (AMBIEN) 10 mg tablet Take 1 Tab by mouth nightly. 03/09/16   Historical Provider     Allergies   Allergen Reactions   ??? 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   ??? Almond Other (comments)     Tongue itches   ??? Carvedilol Other (comments)   ??? Cozaar [Losartan] Cough   ??? Crestor [Rosuvastatin] Unknown (comments)     Heart beats fast      Social History   Substance Use Topics   ??? Smoking status: Never Smoker   ??? Smokeless tobacco: Never Used   ??? Alcohol use Yes      Comment: on occasion      Family History   Problem Relation Age of Onset   ??? Diabetes Mother    ???  Diabetes Brother    ??? Cancer Sister    ??? Cancer Maternal Grandmother    ??? Cancer Daughter       Current Facility-Administered Medications   Medication Dose Route Frequency    ??? vancomycin (VANCOCIN) 1,500 mg in 0.9% sodium chloride 500 mL IVPB  1,500 mg IntraVENous Q12H   ??? [START ON 07/10/2016] Vancomycin trough @ 10:30 prior to 11:00 dose on 8/6. Thanks!   Other ONCE   ??? budesonide (PULMICORT) 500 mcg/2 ml nebulizer suspension  500 mcg Nebulization BID RT   ??? dilTIAZem CD (CARDIZEM CD) capsule 120 mg  120 mg Oral DAILY   ??? tiotropium (SPIRIVA) inhalation capsule 18 mcg  1 Cap Inhalation DAILY   ??? piperacillin-tazobactam (ZOSYN) 3.375 g in 0.9% sodium chloride (MBP/ADV) 100 mL MBP  3.375 g IntraVENous Q6H   ??? azelastine (ASTELIN) 147mcg/spray nasal spray  1 Spray Both Nostrils BID   ??? DULoxetine (CYMBALTA) capsule 30 mg  30 mg Oral DAILY   ??? oxybutynin chloride XL (DITROPAN XL) tablet 10 mg  10 mg Oral DAILY   ??? pregabalin (LYRICA) capsule 75 mg  75 mg Oral TID   ??? rivaroxaban (XARELTO) tablet 20 mg  20 mg Oral DAILY   ??? insulin lispro (HUMALOG) injection   SubCUTAneous AC&HS   ??? pantoprazole (PROTONIX) tablet 40 mg  40 mg Oral ACB       Review of Systems:  Pertinent items are noted in HPI.    Objective:   Vital Signs:    Visit Vitals   ??? BP 137/87 (BP 1 Location: Left arm, BP Patient Position: Sitting)   ??? Pulse 97   ??? Temp 98.8 ??F (37.1 ??C)   ??? Resp 20   ??? Ht 5\' 4"  (1.626 m)   ??? Wt 86.5 kg (190 lb 9.6 oz)   ??? SpO2 96%   ??? Breastfeeding No   ??? BMI 32.72 kg/m2       O2 Device: Nasal cannula   O2 Flow Rate (L/min): 2 l/min   Temp (24hrs), Avg:98.1 ??F (36.7 ??C), Min:97.2 ??F (36.2 ??C), Max:99.3 ??F (37.4 ??C)       Intake/Output:   Last shift:      08/04 0701 - 08/04 1900  In: 240 [P.O.:240]  Out: -   Last 3 shifts: 08/02 1901 - 08/04 0700  In: 1233.9 [P.O.:720; I.V.:513.9]  Out: 600 [Urine:600]    Intake/Output Summary (Last 24 hours) at 07/08/16 1308  Last data filed at 07/08/16 0909   Gross per 24 hour   Intake              240 ml   Output                0 ml   Net              240 ml      Physical Exam:   General:  Alert, cooperative, no distress, appears stated age.    Head:  Normocephalic, without obvious abnormality, atraumatic.   Eyes:  Conjunctivae/corneas clear. PERRL, EOMs intact.   Nose: Nares normal. Mucosa normal. No drainage or sinus tenderness.   Throat: Lips, mucosa, and tongue normal. Teeth and gums normal.   Neck: Supple, symmetrical, trachea midline, no adenopathy, thyroid: no enlargment/tenderness/nodules, no carotid bruit and no JVD.       Lungs:   Diminished lung sounds noted in lower right base, no wheezing or rales. Good airway entry, no dyspnea.    Chest  wall:  No tenderness or deformity.   Heart:  Regular rate and rhythm, S1, S2 normal, no murmur, click, rub or gallop.   Abdomen:   Soft, non-tender. Bowel sounds normal. No masses,  No organomegaly.   Extremities: Extremities normal, atraumatic, no cyanosis. + trace edema.   Pulses: 2+ and symmetric all extremities.   Skin: Skin color, texture, turgor normal. No rashes or lesions       Neurologic: Grossly nonfocal     Data review:     Recent Results (from the past 24 hour(s))   GLUCOSE, POC    Collection Time: 07/07/16  3:38 PM   Result Value Ref Range    Glucose (POC) 106 70 - 110 mg/dL   GLUCOSE, POC    Collection Time: 07/07/16  9:02 PM   Result Value Ref Range    Glucose (POC) 160 (H) 70 - 110 mg/dL   METABOLIC PANEL, BASIC    Collection Time: 07/08/16  6:15 AM   Result Value Ref Range    Sodium 141 136 - 145 mmol/L    Potassium 3.7 3.5 - 5.5 mmol/L    Chloride 106 100 - 108 mmol/L    CO2 28 21 - 32 mmol/L    Anion gap 7 3.0 - 18 mmol/L    Glucose 140 (H) 74 - 99 mg/dL    BUN 11 7.0 - 18 MG/DL    Creatinine 1.61 (L) 0.6 - 1.3 MG/DL    BUN/Creatinine ratio 23 (H) 12 - 20      GFR est AA >60 >60 ml/min/1.68m2    GFR est non-AA >60 >60 ml/min/1.71m2    Calcium 8.9 8.5 - 10.1 MG/DL   PHOSPHORUS    Collection Time: 07/08/16  6:15 AM   Result Value Ref Range    Phosphorus 3.6 2.5 - 4.9 MG/DL   GLUCOSE, POC    Collection Time: 07/08/16  7:21 AM   Result Value Ref Range    Glucose (POC) 138 (H) 70 - 110 mg/dL    GLUCOSE, POC    Collection Time: 07/08/16 11:19 AM   Result Value Ref Range    Glucose (POC) 161 (H) 70 - 110 mg/dL       Imaging:  I have personally reviewed the patient???s radiographs and have reviewed the reports:  CXR Results  (Last 48 hours)               07/07/16 0611  XR CHEST PORT Final result    Impression:  Impression:  Low lung volume chest radiograph with bibasilar atelectasis, stable   on the right, probably improved slightly on the left.               Narrative:  Description:  Portable upright chest radiograph single view       Clinical Indication:  Follow-up pneumonia       Comparison: July 05, 2016.       Findings:  Low lung volume radiograph. Streaky opacities at the right lung base   are stable. There appears to been some improved aeration at the left lung base.   Blunting of the right costophrenic angle noted. Cardiac silhouette mildly   enlarged, stable. Mediastinum within normal limits. No pneumothorax or   subdiaphragmatic free air.                 CT Results  (Last 48 hours)    None           ECHO:  Echo Results  (  Last 48 hours)               07/06/16 1343  2D ECHO COMPLETE ADULT (TTE) W OR WO CONTR Final result    Narrative:  [facility]   [address]   [place], [place] [address]   [phone]       Transthoracic Echocardiogram       Patient: Cynthia Marsh   MRN: [id number]   ACCT #: [id number]   DOB: 1951-09-26   Age: 44 years   Gender: Female   Height: 64 in   Weight: 180.6 lb   BSA: 1.88 m-sq   BP: 101 / 71 mmHg   Study date: 06-Jul-2016   Status: Routine   Location: [facility]   DMS ACC #: [id number]       Allergies: TREE NUT, ALMOND, CARVEDILOL, LOSARTAN, ROSUVASTATIN       Referring_Ordering Physician:  Sondra Barges Hospitalist   Interpreting Group:  [facility]   Interpreting Physician:  Jun K. Dory Peru, MD   Technologist:  Lauralyn Primes, RCS, RDCS       IMPRESSIONS:   A clinically significant myopathic process is ruled out. There was no   significant valvular heart disease.        SUMMARY:   Procedure information: This was a technically difficult study.       Left ventricle: Systolic function was normal by visual assessment.   Ejection fraction was estimated to be 60 %. No obvious wall motion   abnormalities identified in the views obtained.       Right ventricle: Systolic pressure was mildly increased. Estimated peak   pressure was 45 mmHg.       COMPARISONS:   Comparison was made with the previous study of 07-Nov-2015. Pulmonary   artery pressure has increased from 30 mmHg to 45 mmHg.       INDICATIONS: Abnormal cardiac markers.       HISTORY: Prior history: Risk factors: hypertension, diabetes, and CKD. PE.       PROCEDURE: This was a routine study. The study included complete 2D   imaging, M-mode, complete spectral Doppler, and color Doppler. The heart   rate was 96 bpm, at the start of the study. Systolic blood pressure was   784 mmHg, at the start of the study. Diastolic blood pressure was 71 mmHg,   at the start of the study. Images were obtained from the parasternal,   apical, and subcostal acoustic windows. Echocardiographic views were   limited by poor acoustic window availability. This was a technically   difficult study.       LEFT VENTRICLE: Size was normal. Systolic function was normal by visual   assessment. Ejection fraction was estimated to be 60 %. No obvious wall   motion abnormalities identified in the views obtained. Wall thickness was   normal. DOPPLER: Left ventricular diastolic function parameters were   normal for the patient's age.       RIGHT VENTRICLE: The size was normal. Systolic function was normal.   DOPPLER: Systolic pressure was mildly increased. Estimated peak pressure   was 45 mmHg.       LEFT ATRIUM: Size was normal. LA volume index was 22 ml/m-sq.       RIGHT ATRIUM: Size was normal.       MITRAL VALVE: There was annular calcification. Normal valve structure.   DOPPLER: There was no evidence for stenosis. There was no significant   regurgitation.  AORTIC VALVE: The valve was probably trileaflet. Leaflets exhibited good   mobility. DOPPLER: There was no stenosis. There was no significant   regurgitation.       TRICUSPID VALVE: Normal valve structure. DOPPLER: There was no evidence   for tricuspid stenosis. There was mild regurgitation. Right atrial   pressure estimate: 3 mmHg.       PULMONIC VALVE: Not well visualized, but normal Doppler findings.       AORTA: The root exhibited normal size.       SYSTEMIC VEINS: IVC: The inferior vena cava was normal in size.   Respirophasic changes were normal.       PERICARDIUM: Insignificant pericardial effusion and/or pericardial fat was   present.       SYSTEM MEASUREMENT TABLES       2D   Ao Diam: 3.04 cm   IVSd: 0.97 cm   LVIDd: 4.03 cm   LVIDs: 2.45 cm   LVPWd: 1.01 cm   LAAs A2C: 16.29 cm2   LAAs A4C: 16.92 cm2   LAESV A-L A2C: 40.51 ml   LAESV A-L A4C: 41.65 ml   LAESV Index (A-L): 22.38 ml/m2   LAESV MOD A2C: 38.65 ml   LAESV MOD A4C: 39.84 ml   LAESV(A-L): 42.07 ml   LALs A2C: 5.56 cm   LALs A4C: 5.83 cm   LVOT Diam: 1.97 cm   RA Area: 13.39 cm2   RV Minor: 3.61 cm       CW   TR Vmax: 3.23 m/s   IVRT: 94.58 ms   TR maxPG: 41.81 mmHG       MM   Tapse: 2.07 cm       PW   LVOT VTI: 19.24 cm   LVOT Vmax: 1.14 m/s   LVOT Vmean: 0.79 m/s   MV A Vel: 1 m/s   MV Dec Slope: 4.98 m/s2   MV DecT: 191.67 ms   MV E Vel: 0.96 m/s   MV E/A Ratio: 0.95   E': 0.07 m/s   E/E': 14   LVOT maxPG: 5.18 mmHG   LVOT meanPG: 2.86 mmHG   LVSI Dopp: 31.33 ml/m2   LVSV Dopp: 58.89 ml   Lateral E': 0.1 m/s   Lateral E/E': 9.71       Prepared and E-signed by       Jun K. Dory Peru, MD   Signed 06-Jul-2016 16:16:43                 IMPRESSION:   ?? Right lower lobe pneumonia- Probable CAP vs other (Strep Pneumo and Legionella Ag negative)- Failure of OP therapy with Levaquin   ?? SVT 8/2-07/07/16  ?? Asthma intermittent  ?? Cough - improved  ?? Anemia Hb: 12-->9 : no evidence of acute bleeding on Xarelto - improving, stable, asymptomatic    ?? OSA - newly diagnosed - will set up for Home C-pap.   Past Hx  ?? DM2  ?? HTN  ?? PE 2016 - on Xarelto  ?? Depression/Anxiety  ?? Chronic Lower Back Pain - managed by pain clinic      RECOMMENDATIONS:   ?? Supplemental O2 3L at this time to keep sats > 94% Titrate as tolerated to RA as patient clinically improves.  ?? Continue Zosyn, D/C vancomycin.   ?? Monitor for leukocytosis and fever.  ?? Broncho hygiene protocol, ICS to bedside  ?? Continue Pulmicort (ICS) and change PRN albuterol (SABA) to 1.25 q 6 hrs PRN. Monitor for repeat tachycardia.  Continue Spiriva (inhaled anticholinergic).  ?? Continue anticoagulation - Xarelto.    ?? Glucose control per primary.  ?? No further SVT seen last night.   ??   ?? DISPO:  ?? Consult case management and order DME for Cpap machine, settings 12 cwp, on 1 LPM O2 q night.- per sleep study- with new pneumonia the patient may have a slightly higher requirement. The patient should sleep with either 1LPM oxygen or the current requirement for walking- default to the higher setting.    ?? Please assess need for oxygen at rest and with walking.    ??   ?? Follow up pneumonia with pulmonologist Dr. Neale Burly in 4-6 wks after discharge and Sleep Physician Dr. Benjamine Sprague as well in 4- 6 wks.             Jeanell Sparrow, NP     Pulmonary Staff    I have independently evaluated the patient. I agree with the above along with the following observations. The patient states that she is feeling much better today. Breathing and back pain are much improved. The patient underwent a recent CPAP titration study prior to admission.  HR for the study ran 80-90's. No SVT. OSA and nocturanl hypoxemia were best optimized with CPAP 12 cwp + 1 LPM supplemental oxygen.    I have provided the patient with CPAP and OSA education. I have discussed with the patient and family the need to keep CPAP and accessories clean. Goal of CPAP therapy is to use as much as possible while sleeping and  napping. The minimal amount of use is 4 hrs/night 5/7 nights per week.    From as asthma standpoint the patient was taking Symbicort (LABA/ICS) and Spiriva.  LABA discontinued due to SVT. We are continuing with Spiriva, ICS and PRN albuterol. The patient did not need a breathing treatment last night and feels that her breathing has been improving. Monitor for any wheezing or brocnhospasm.    Dennie Bible, DO  Pulmonary, Sleep and Critical Care Medicine

## 2016-07-08 NOTE — Other (Signed)
IDR/SLIDR Summary          Patient: Cynthia Marsh MRN: [id number]    Age: 65 y.o.     Birthdate: [birth year] Room/Bed: 201/01   Admit Diagnosis: Pneumonia  Principal Diagnosis: <principal problem not specified>   Goals: Go home  Readmission: NO  Quality Measure: SEPSIS  VTE Prophylaxis: Chemical  Influenza Vaccine screening completed? NO  Pneumococcal Vaccine screening completed? NO  Mobility needs: No   Nutrition plan:Yes  Consults:O.T. and Respiratory    Financial concerns:No  Escalated to CM? NO  RRAT Score: 15   Interventions:Home Health  Testing due for pt today? NO  LOS: 3 days Expected length of stay 3 days  Discharge plan: Go home   PCP: Lenn Sink, MD  Transportation needs: Yes, No    Days before discharge:two or more days before discharge   Discharge disposition: Home    Signed:     April M Roser  07/08/2016  5:12 AM

## 2016-07-08 NOTE — Progress Notes (Addendum)
Progress Note    Patient: Cynthia Marsh MRN: [id number]  CSN: [id number]    Date of Birth: [birth year]  Age: 65 y.o.  Sex: female    DOA: 07/05/2016 LOS:  LOS: 3 days                    Subjective:   No acute overnight events.    Cynthia Marsh reports feeling more rested than yesturday.  Is eager to return home. Otherwise no acute complaints.    Chief Complaint:    Chief Complaint   Patient presents with   ??? Shortness of Breath   ??? Chest Pain       Review of systems  General: No fevers or chills.  Cardiovascular: No chest pain or pressure. No palpitations.   Pulmonary: No shortness of breath.  +cough (reports improving).   Gastrointestinal: No abdominal pain, nausea, vomiting or diarrhea.   Genitourinary: No urinary frequency, urgency, hesitancy or dysuria.   Musculoskeletal: No joint or muscle pain, no back pain, no recent trauma.    Neurologic: No headache, numbness, tingling or weakness.     Objective:     Physical Exam:  Visit Vitals   ??? BP 115/84 (BP 1 Location: Right arm, BP Patient Position: At rest)   ??? Pulse 89   ??? Temp 97.8 ??F (36.6 ??C)   ??? Resp 18   ??? Ht  (1.626 m)   ??? Wt 86.5 kg (190 lb 9.6 oz)   ??? SpO2 95%   ??? Breastfeeding No   ??? BMI 32.72 kg/m2        General:         Alert, cooperative, no acute distress????  HEENT: NC, Atraumatic.  PERRLA, anicteric sclerae.  Lungs: Rales bilateral lung fields, most notably RLL.  Heart:  Regular  rhythm,?? No murmur, No Rubs, No Gallops, slightly tachycardic.  Abdomen: Soft, Non distended, Non tender. ??+Bowel sounds, no HSM  Extremities: No c/c/e  Psych:???? Good insight.??Not anxious or agitated.  Neurologic:?? CN 2-12 grossly intact, Alert and oriented X 3.  No acute neurological                                 Deficits,     Intake and Output:  Current Shift:  08/04 0701 - 08/04 1900  In: 240 [P.O.:240]  Out: -   Last three shifts:  08/02 1901 - 08/04 0700  In: 1233.9 [P.O.:720; I.V.:513.9]  Out: 600 [Urine:600]    Labs: Results:       Chemistry Recent Labs       07/08/16   0615  07/07/16   0200  07/06/16   0236   GLU  140*  159*  113*   NA  141  138  139   K  3.7  3.8  3.2*   CL  106  104  104   CO2  BUN  CREA  0.47*  0.50*  0.52*   CA  8.9  9.2  8.3*   AGAP  BUCR  23*  22*  21*   AP   --    --   59   TP   --    --   6.4   ALB   --    --   2.3*  GLOB   --    --   4.1*   AGRAT   --    --   0.6*      CBC w/Diff Recent Labs      07/07/16   0200  07/06/16   0236  07/05/16   1730   WBC  6.6  7.0  9.1   RBC  3.70*  3.53*  4.12*   HGB  9.6*  9.2*  10.7*   HCT  30.3*  29.1*  33.2*   PLT  312  299  336   GRANS   --   62  70   LYMPH   --   21  15*   EOS   --   1  0      Cardiac Enzymes Recent Labs      07/07/16   0200  07/06/16   0236   CPK  543*  867*   CKND1  CALCULATION NOT PERFORMED WHEN RESULT IS BELOW LINEAR LIMIT  0.3      Coagulation Recent Labs      07/05/16   1730   PTP  19.4*   INR  1.7*   APTT  47.6*       Lipid Panel Lab Results   Component Value Date/Time    Cholesterol, total 113 07/06/2016 02:36 AM    HDL Cholesterol 21 07/06/2016 02:36 AM    LDL, calculated 62.2 07/06/2016 02:36 AM    VLDL, calculated 29.8 07/06/2016 02:36 AM    Triglyceride 149 07/06/2016 02:36 AM    CHOL/HDL Ratio 5.4 07/06/2016 02:36 AM      BNP No results for input(s): BNPP in the last 72 hours.   Liver Enzymes Recent Labs      07/06/16   0236   TP  6.4   ALB  2.3*   AP  59   SGOT  36      Thyroid Studies Lab Results   Component Value Date/Time    TSH 1.62 07/07/2016 02:00 AM          Procedures/imaging: see electronic medical records for all procedures/Xrays and details which were not copied into this note but were reviewed prior to creation of Plan    Medications:   Current Facility-Administered Medications   Medication Dose Route Frequency   ??? vancomycin (VANCOCIN) 1,500 mg in 0.9% sodium chloride 500 mL IVPB  1,500 mg IntraVENous Q12H   ??? budesonide (PULMICORT) 500 mcg/2 ml nebulizer suspension  500 mcg Nebulization BID RT    ??? albuterol (ACCUNEB) nebulizer solution 1.25 mg  1.25 mg Nebulization Q6H PRN   ??? dilTIAZem CD (CARDIZEM CD) capsule 120 mg  120 mg Oral DAILY   ??? tiotropium (SPIRIVA) inhalation capsule 18 mcg  1 Cap Inhalation DAILY   ??? piperacillin-tazobactam (ZOSYN) 3.375 g in 0.9% sodium chloride (MBP/ADV) 100 mL MBP  3.375 g IntraVENous Q6H   ??? azelastine (ASTELIN) 126mcg/spray nasal spray  1 Spray Both Nostrils BID   ??? DULoxetine (CYMBALTA) capsule 30 mg  30 mg Oral DAILY   ??? hydrALAZINE (APRESOLINE) tablet 25 mg  25 mg Oral TID PRN   ??? oxybutynin chloride XL (DITROPAN XL) tablet 10 mg  10 mg Oral DAILY   ??? oxyCODONE-acetaminophen (PERCOCET 10) 10-325 mg per tablet 1 Tab  1 Tab Oral Q6H PRN   ??? pregabalin (LYRICA) capsule 75 mg  75 mg Oral TID   ??? rivaroxaban (XARELTO) tablet 20 mg  20 mg Oral DAILY   ???  zolpidem (AMBIEN) tablet 10 mg  10 mg Oral QHS PRN   ??? insulin lispro (HUMALOG) injection   SubCUTAneous AC&HS   ??? pantoprazole (PROTONIX) tablet 40 mg  40 mg Oral ACB       Assessment/Plan     Active Problems:    Depression ()      Anxiety ()      Mild intermittent asthma without complication (04/20/2015)      Type 2 diabetes mellitus without complication (HCC) (04/20/2015)      Hx of spinal fusion (08/17/2015)      Dyslipidemia (04/27/2016)      Community acquired pneumonia (07/05/2016)      Rhabdomyolysis (07/05/2016)      Pneumonia (07/05/2016)      Plan:  ??  Community Acquired pneumonia failed outpatient treatment with PO levaquin  Continue Zosyn and vancomycin (8/2-present  Follow up respiratory culture and blood culture is pending  Urine strept Ag and Legionella AG negative  Continue Albuterol to 1.25 q 6 hrs PRN  Tolerating PO well, ST recommended reg diet with thin liquids  Pulmonary consult, Dr. Benjamine Sprague, appreciated, will f/u recommendations  ??  SVT  Resolved, now sinus tachycardia, off dilt drip  Echo overall unremarkable, EF 60%  Appreciate cardiology consult, will follow up recommendations. Transitioned to PO cardizem.       Asthma  Symbicort 2 puffs BID discontinued, continue Pulmicort   Albuterol (SABA) to 1.25 q 6 hrs PRN   Continue Spiriva   ??  Rhabdomyolysis   Mild, Improved with hydration in the ER  Holding Pravachol  Lipids at goal, would recommend continue pravachol when acute illness resolves  ??  Hypokalemia  Resolved with supplementation, monitor and repleate as needed  ??  UTI - non-issue  Follow up culture - mixed flora/no growth 2 days, likely contaminated urine sample  ??  Chronic back pain and arthritis Rt knee  Percocet prn  F/u spine as outpatient  ????  Hypertension  Hydralazine prn for SBP above 160  Hold Norvasc and HCTZ, resume if bp sustained in hypertensive range, with BP trending upwards will likely need to restart in am  ??  Hyperlipidemia  Hold Pravachol as above  ??  Diabetes Mellitus type 2  Sliding scale coverage  HG A1c 7.5  Holding janumet, resume tomorrow 8/5 (72hrs after last IV contrast load -8/2 CTA)  ??    Pt and ot evaluation and treatment - discharged as no requirements noted  DVT/GI Prophylaxis: H2B/PPI and Xarelto and protonix      Anne Shutter, MD  07/08/2016 13:13

## 2016-07-09 LAB — METABOLIC PANEL, BASIC
Anion gap: 8 mmol/L (ref 3.0–18)
BUN/Creatinine ratio: 21 — ABNORMAL HIGH (ref 12–20)
BUN: 11 MG/DL (ref 7.0–18)
CO2: 26 mmol/L (ref 21–32)
Calcium: 9.2 MG/DL (ref 8.5–10.1)
Chloride: 105 mmol/L (ref 100–108)
Creatinine: 0.53 MG/DL — ABNORMAL LOW (ref 0.6–1.3)
GFR est AA: >60 mL/min/{1.73_m2} (ref >60)
GFR est non-AA: >60 mL/min/{1.73_m2} (ref >60)
Glucose: 181 mg/dL — ABNORMAL HIGH (ref 74–99)
Potassium: 3.6 mmol/L (ref 3.5–5.5)
Sodium: 139 mmol/L (ref 136–145)

## 2016-07-09 LAB — GLUCOSE, POC
Glucose (POC): 129 mg/dL — ABNORMAL HIGH (ref 70–110)
Glucose (POC): 129 mg/dL — ABNORMAL HIGH (ref 70–110)
Glucose (POC): 224 mg/dL — ABNORMAL HIGH (ref 70–110)
Glucose (POC): 82 mg/dL (ref 70–110)

## 2016-07-09 LAB — CULTURE, BLOOD
Culture result:: NO GROWTH
Culture result:: NO GROWTH

## 2016-07-09 MED ORDER — SITAGLIPTIN 50 MG TAB
50 mg | Freq: Every day | ORAL | Status: DC
Start: 2016-07-09 — End: 2016-07-12
  Administered 2016-07-10 – 2016-07-12 (×3): via ORAL

## 2016-07-09 MED ORDER — METFORMIN 500 MG TAB
500 mg | Freq: Two times a day (BID) | ORAL | Status: DC
Start: 2016-07-09 — End: 2016-07-12
  Administered 2016-07-09 – 2016-07-12 (×6): via ORAL

## 2016-07-09 MED FILL — BUDESONIDE 0.5 MG/2 ML NEB SUSPENSION: 0.5 mg/2 mL | RESPIRATORY_TRACT | Qty: 1

## 2016-07-09 MED FILL — PIPERACILLIN-TAZOBACTAM 3.375 GRAM IV SOLR: 3.375 gram | INTRAVENOUS | Qty: 3.38

## 2016-07-09 MED FILL — VANCOMYCIN 10 GRAM IV SOLR: 10 gram | INTRAVENOUS | Qty: 1500

## 2016-07-09 MED FILL — OXYCODONE-ACETAMINOPHEN 10 MG-325 MG TAB: 10-325 mg | ORAL | Qty: 1

## 2016-07-09 MED FILL — LYRICA 75 MG CAPSULE: 75 mg | ORAL | Qty: 1

## 2016-07-09 MED FILL — CYMBALTA 30 MG CAPSULE,DELAYED RELEASE: 30 mg | ORAL | Qty: 1

## 2016-07-09 MED FILL — ZOLPIDEM 5 MG TAB: 5 mg | ORAL | Qty: 2

## 2016-07-09 MED FILL — PANTOPRAZOLE 40 MG TAB, DELAYED RELEASE: 40 mg | ORAL | Qty: 1

## 2016-07-09 MED FILL — OXYBUTYNIN CHLORIDE SR 5 MG 24 HR TAB: 5 mg | ORAL | Qty: 2

## 2016-07-09 MED FILL — METFORMIN 500 MG TAB: 500 mg | ORAL | Qty: 2

## 2016-07-09 MED FILL — XARELTO 20 MG TABLET: 20 mg | ORAL | Qty: 1

## 2016-07-09 MED FILL — INSULIN LISPRO 100 UNIT/ML INJECTION: 100 unit/mL | SUBCUTANEOUS | Qty: 1

## 2016-07-09 MED FILL — DILTIAZEM ER 120 MG 24 HR CAP: 120 mg | ORAL | Qty: 1

## 2016-07-09 NOTE — Other (Signed)
Bedside and Verbal shift change report given to Tammy Scott (oncoming nurse) by J.paxton (offgoing nurse). Report included the following information Kardex, Intake/Output and MAR.

## 2016-07-09 NOTE — Progress Notes (Signed)
Problem: Mobility Impaired (Adult and Pediatric)  Goal: *Acute Goals and Plan of Care (Insert Text)  STG for 8.2.17 to be completed by 8.9.17 (7 days).  1. Pt will complete supine to/from sit with S in order to complete EOB activity.  2. Pt will complete sit to/from stand transfer with RW and S in prep for ambulation.  3. Pt will ambulate 100 feet with RW and min A in order to negotiate household distances.   Outcome: Progressing Towards Goal  PHYSICAL THERAPY TREATMENT     Patient: Cynthia Marsh (65 y.o. female)  Date: 07/09/2016  Diagnosis: Pneumonia <principal problem not specified>  Precautions:     Chart, physical therapy assessment, plan of care and goals were reviewed.      ASSESSMENT:  Pt found supine in bed willing to work with PT. Pt found with ripped gown, supplied with 2 gowns for ambulation into hallway. Pt sat EOB then states she would like to change gowns on her own and ambulated into bathroom without AD. Pt with fair + balance when doing so. Pt changed gowns in bathroom, requiring assistance to don gown on backside. Pt ambulated into hallway with RW and 2LO2 via nasal cannula. Pt able to increase distance this tx, returning to EOB to perform therex. Pt then returned to supine and left with needs in reach. PT reports 0 SOB this tx.     Education: therex , gait.  Progression toward goals:        Improving appropriately and progressing toward goals        Improving slowly and progressing toward goals        Not making progress toward goals and plan of care will be adjusted       PLAN:  Patient continues to benefit from skilled intervention to address the above impairments.  Continue treatment per established plan of care.  Discharge Recommendations:  Home Health  Further Equipment Recommendations for Discharge:  rolling walker / pt has rollator       SUBJECTIVE:   Patient stated ???I gotta do it.???      OBJECTIVE DATA SUMMARY:   Critical Behavior:  Neurologic State: Alert   Orientation Level: Oriented X4  Cognition: Appropriate decision making  Functional Mobility Training:  Bed Mobility:  Supine to Sit: Supervision  Sit to Supine: Supervision  Transfers:  Sit to Stand: Supervision  Stand to Sit: Supervision  Balance:  Sitting: Intact  Sitting - Static: Good (unsupported)  Sitting - Dynamic: Good (unsupported)  Standing: Impaired;With support  Standing - Static: Good  Standing - Dynamic : Fair (+)  Ambulation/Gait Training:  Distance (ft): 220 Feet (ft)  Assistive Device: Walker, rolling  Ambulation - Level of Assistance: Contact guard assistance;Stand-by asssistance  Gait Abnormalities: Decreased step clearance  Base of Support: Narrowed  Speed/Cadence: Slow  Step Length: Left shortened;Right shortened     Therapeutic Exercises:   LAQ, seated marches x 10 bilaterally.  Pain:  Pre tx pain: 0  Post tx pain: 0     Activity Tolerance:   Fair  Please refer to the flowsheet for vital signs taken during this treatment.  After treatment:    Patient left in no apparent distress sitting up in chair   Patient left in no apparent distress in bed   Call bell left within reach   Nursing notified   Caregiver present   Bed alarm activated      Rita Ohara, PTA  Time Calculation: 25 mins     Mobility G8978 Current  CI= 1-19%.  The severity rating is based on the Level of Assistance required for Functional Mobility and ADLs.     Mobility  G8979 Goal  CI= 1-19%.  The severity rating is based on the Level of Assistance required for Functional Mobility and ADLs.

## 2016-07-09 NOTE — Progress Notes (Signed)
Progress Note    Patient: Cynthia Marsh MRN: [id number]  CSN: [id number]    Date of Birth: 10-May-1951  Age: 65 y.o.  Sex: female    DOA: 07/05/2016 LOS:  LOS: 4 days                    Subjective:   Pt is feeling better had OSA test waiting for c-pap machine pt has been on o2 by NC 2 L/M cough improved no chest pain has been c/o frequency and incontinence pt has been on oxybutynin before admission     Pt tolerating her diet . Pt had pt and ot evaluation would like to go to [facility] course  65 y.o. female with h/o CKD, Asthma, HTN, PE 2016, and chronic back pain who presentedto ED after evaluation at our office yesterday  She has been  complaining of chest pain and SOB onset Sunday but worsened over the past 2 days. Pt was seen at the ER twice last week end diagnosed with pneumonia and UTI   Pt started on Levaquin.  ??  Pt has h/o chronic back pain   Pt has been f/u spine center she had MRI of the Thoracic and lumbar spine to rule out discitis/ epidural abscess. Has chronic Lumbar back pain and is on pain management. MRI 07/03/16 thoracic spine showed pneumonia   1. Evidence of pneumonia in the right greater than left lower lobe.  2. Lower thoracic paravertebral enhancement felt to be related to vessels and  abutting reactive changes. No acute thoracic spine findings. Follow-up CT  imaging with contrast can be performed to exclude acute infectious paraspinal process here.  ??   pt was c/o cough , fatigue at the office and has been feeling getting worse pt was sent back to ER WBC, CPK was elevated 1272 troponin I normal repeat CTA showed   Less than optimal contrast bolus. Proximal pulmonary arteries are well  opacified, more distal vessels not as well opacified limiting evaluation for  pulmonary embolus beyond the lobar branches.  ????  Within the limitations of the exam there is no finding of central pulmonary  embolus.  ????  Lobar consolidation in the posterior right lower lobe consistent with pneumonia.   Right middle lobe opacity that may be atelectasis or infection.   ????   ??  Pt was started on zosyn and vancomycin urine stret AG and legionella negative . Pt is feeling better today still has some soreness in the chest from coughing   ??  Pt has H/O chronic cough/ asthma   for 6 months had APPT with Dr Neale Burly before admission  but she couldn't make it    8/2 pt  had RRT for SVT with HR 180s, patient was asymptomatic with normal BP during the episode, started on diltiazem drip that initially helped but then resumed HR 170s, given adenosine 6mg  x1 after pre-treatment with ativan and HR has been 90-110s since without further SVT.  Cardiology consulted.      Chief Complaint:   Chief Complaint   Patient presents with   ??? Shortness of Breath   ??? Chest Pain       Review of systems  General: No fevers or chills.  Cardiovascular: No chest pain or pressure. No palpitations.   Pulmonary: No shortness of breath.  +cough.   Gastrointestinal: No abdominal pain, nausea, vomiting or diarrhea.   Genitourinary: No urinary frequency, urgency, hesitancy or dysuria.  Musculoskeletal: No joint or muscle pain, no back pain, no recent trauma.    Neurologic: No headache, numbness, tingling or weakness.     Objective:     Physical Exam:  Visit Vitals   ??? BP 111/78 (BP 1 Location: Right arm, BP Patient Position: At rest)   ??? Pulse 95   ??? Temp 98.6 ??F (37 ??C)   ??? Resp 20   ??? Ht 5\' 4"  (1.626 m)   ??? Wt 78.7 kg (173 lb 9.6 oz)   ??? SpO2 95%   ??? Breastfeeding No   ??? BMI 29.8 kg/m2        General:         Alert, cooperative, no acute distress????  HEENT: NC, Atraumatic.  PERRLA, anicteric sclerae.  Lungs: Rales bilateral lung fields, most notably RLL.  Heart:  Regular  rhythm,?? No murmur, No Rubs, No Gallops, slightly tachycardic.  Abdomen: Soft, Non distended, Non tender. ??+Bowel sounds, no HSM  Extremities: No c/c/e  Psych:???? Good insight.??Not anxious or agitated.  Neurologic:?? CN 2-12 grossly intact, Alert and oriented X 3.  No acute  neurological                                 Deficits,     Intake and Output:  Current Shift:  08/05 0701 - 08/05 1900  In: 240 [P.O.:240]  Out: -   Last three shifts:  08/03 1901 - 08/05 0700  In: 960 [P.O.:960]  Out: 400 [Urine:400]    Labs: Results:       Chemistry Recent Labs      07/09/16   0425  07/08/16   0615  07/07/16   0200   GLU  181*  140*  159*   NA  139  141  138   K  3.6  3.7  3.8   CL  105  106  104   CO2  26  28  26    BUN  11  11  11    CREA  0.53*  0.47*  0.50*   CA  9.2  8.9  9.2   AGAP  8  7  8    BUCR  21*  23*  22*      CBC w/Diff Recent Labs      07/07/16   0200   WBC  6.6   RBC  3.70*   HGB  9.6*   HCT  30.3*   PLT  312      Cardiac Enzymes Recent Labs      07/07/16   0200   CPK  543*   CKND1  CALCULATION NOT PERFORMED WHEN RESULT IS BELOW LINEAR LIMIT      Coagulation No results for input(s): PTP, INR, APTT in the last 72 hours.    No lab exists for component: INREXT, INREXT    Lipid Panel Lab Results   Component Value Date/Time    Cholesterol, total 113 07/06/2016 02:36 AM    HDL Cholesterol 21 07/06/2016 02:36 AM    LDL, calculated 62.2 07/06/2016 02:36 AM    VLDL, calculated 29.8 07/06/2016 02:36 AM    Triglyceride 149 07/06/2016 02:36 AM    CHOL/HDL Ratio 5.4 07/06/2016 02:36 AM      BNP No results for input(s): BNPP in the last 72 hours.   Liver Enzymes No results for input(s): TP, ALB, TBIL, AP, SGOT, GPT in the last 72 hours.  No lab exists for component: DBIL   Thyroid Studies Lab Results   Component Value Date/Time    TSH 1.62 07/07/2016 02:00 AM          Procedures/imaging: see electronic medical records for all procedures/Xrays and details which were not copied into this note but were reviewed prior to creation of Plan    Medications:   Current Facility-Administered Medications   Medication Dose Route Frequency   ??? vancomycin (VANCOCIN) 1,500 mg in 0.9% sodium chloride 500 mL IVPB  1,500 mg IntraVENous Q12H   ??? [START ON 07/10/2016] Vancomycin trough @ 10:30 prior to 11:00 dose on  8/6. Thanks!   Other ONCE   ??? budesonide (PULMICORT) 500 mcg/2 ml nebulizer suspension  500 mcg Nebulization BID RT   ??? albuterol (ACCUNEB) nebulizer solution 1.25 mg  1.25 mg Nebulization Q6H PRN   ??? dilTIAZem CD (CARDIZEM CD) capsule 120 mg  120 mg Oral DAILY   ??? tiotropium (SPIRIVA) inhalation capsule 18 mcg  1 Cap Inhalation DAILY   ??? piperacillin-tazobactam (ZOSYN) 3.375 g in 0.9% sodium chloride (MBP/ADV) 100 mL MBP  3.375 g IntraVENous Q6H   ??? azelastine (ASTELIN) 130mcg/spray nasal spray  1 Spray Both Nostrils BID   ??? DULoxetine (CYMBALTA) capsule 30 mg  30 mg Oral DAILY   ??? hydrALAZINE (APRESOLINE) tablet 25 mg  25 mg Oral TID PRN   ??? oxybutynin chloride XL (DITROPAN XL) tablet 10 mg  10 mg Oral DAILY   ??? oxyCODONE-acetaminophen (PERCOCET 10) 10-325 mg per tablet 1 Tab  1 Tab Oral Q6H PRN   ??? pregabalin (LYRICA) capsule 75 mg  75 mg Oral TID   ??? rivaroxaban (XARELTO) tablet 20 mg  20 mg Oral DAILY   ??? zolpidem (AMBIEN) tablet 10 mg  10 mg Oral QHS PRN   ??? insulin lispro (HUMALOG) injection   SubCUTAneous AC&HS   ??? pantoprazole (PROTONIX) tablet 40 mg  40 mg Oral ACB       Assessment/Plan     Active Problems:    Depression ()      Anxiety ()      Mild intermittent asthma without complication (04/20/2015)      Type 2 diabetes mellitus without complication (HCC) (04/20/2015)      Hx of spinal fusion (08/17/2015)      Dyslipidemia (04/27/2016)      Community acquired pneumonia (07/05/2016)      Rhabdomyolysis (07/05/2016)      Pneumonia (07/05/2016)      Plan:  ??   Community Acquired pneumonia failed outpatient treatment with PO levaquin  Continue Zosyn and vancomycin (8/2-  respiratory culture  No result and blood culture urine culture negative  Urine strept Ag and Legionella AG negative  Albuterol 1.25  nebulizer q 4h prn  Swallow evaluation  Switch Symbicort to Pulmicort nebulizer after SVT   F/u pulmonary consult   ??  SVT  Resolved, now sinus tachycardia  Echo EJF 60 % no wall motion abnormalities    Pt on oral Cardizem CD 120 mg daily    Asthma  Pulmicort q 12 h   Albuterol 1.25  nebulizer q 4 h prn  ??  ??  Rhabdomyolysis   Mild, Improved with hydration in the ER  Holding Pravachol  Recheck CPK  ??  Hypokalemia  Resolved with supplementation, monitor and repleate as needed  ??  UTI  Culture is negative  ??  Chronic back pain and arthritis Rt knee  Percocet prn  F/u spine as  outpatinet  ????  Hypertension  Hydralazine prn for SBP above 160  Cardizem Cd 120 mg daily  ??  Hyperlipidemia  Hold Pravachol as above  ??  Diabetes Mellitus type 2  Sliding scale coverage  Check HG A1c7.%  Restart  Janumet continue to monitor with Humalog  ??     restart Pt and ot evaluation and treatment  Pt feels weak would like to have rehab  Cbc, cmp, mag CPK    DVT/GI Prophylaxis: H2B/PPI and Xarelto and protonix      Lenn Sink, MD  07/09/2016 11:51 AM

## 2016-07-09 NOTE — Other (Signed)
Bedside shift change report given to Burton Apley, RN (oncoming nurse) by Harvin Hazel, RN (offgoing nurse). Report included the following information SBAR, Kardex, ED Summary, Intake/Output, MAR, Accordion, Recent Results and Med Rec Status.

## 2016-07-09 NOTE — Progress Notes (Signed)
[facility]  Pulmonary, Critical Care, and Sleep Medicine    Pulmonary Progress Note    Name: BERRY GALLACHER MRN: [id number]   DOB: 02/25/1951 Hospital: [facility]   Date: 07/09/2016          Subjective:    Patient is a 65 y.o. female who presented to the ED with complaints of shortness of breath and pneumonia and lower back pain who was admitted. CTA done, negative for PE. Patient started on abx for pneumonia.     07/09/16  ?? Tolerated breakfast  ?? Patient stated that her breathing is better and that her back also feels better as well.  ?? Patient was rapid responded 8/3 for SVT. Was given adenosine and Cardizem. She is now on po Cardizem. Currently heart rate is controlled. EKG during event negative for A-fib. Cardiology following  ?? Afebrile, Normotensive  ?? Denies nausea, vomiting, dizziness, headache.    Past Medical History:   Diagnosis Date   ??? Anxiety    ??? Asthma    ??? 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 Cardiac echocardiogram 11/07/2015    EF 60%.  No RWMA.  Gr 1 DDfx.  RVSP 30 mmHg.     ??? Cardiovascular lower extremity venous duplex 11/06/2015    No DVT bilaterally.   ??? Chronic kidney disease    ??? Depression    ??? Developmental delay    ??? Diabetes (HCC)    ??? Diabetes mellitus (HCC)    ??? History of nuclear stress test 03/2016    No ischemia, no infarct, EF 67%.  Low risk study   ??? Hypertension    ??? Insomnia    ??? Osteoporosis    ??? Pneumonia 2010   ??? Thromboembolus [facility])       Past Surgical History:   Procedure Laterality Date   ??? BREAST SURGERY PROCEDURE UNLISTED       Left cyst removed    ??? HAND/FINGER SURGERY UNLISTED     ??? HX BACK SURGERY  2012    L4/L5/S1 fused in [place]   ??? HX CYST INCISION AND DRAINAGE Left    ??? HX CYST REMOVAL      calf    ??? HX GYN      complete hyst    ??? HX GYN      tubal ligation   ??? HX HYSTERECTOMY     ??? HX KNEE ARTHROSCOPY     ??? HX OOPHORECTOMY     ??? HX ORTHOPAEDIC      Right shoulder rotator cuff    ??? HX ORTHOPAEDIC       Left Little toe hammer toe, left big toe bunion removed   ??? HX ORTHOPAEDIC      Right knee torn meniscus   ??? HX ORTHOPAEDIC      CTS bilateral , Left thumb trigger finger    ??? HX WRIST FRACTURE TX     ??? LAMINECTOMY,CERVICAL     ??? PR ANESTH,SURGERY OF SHOULDER        Prior to Admission medications    Medication Sig Start Date End Date Taking? Authorizing Provider   levoFLOXacin (LEVAQUIN) 750 mg tablet Take 1 Tab by mouth daily for 10 days. 07/03/16 07/13/16  Garlon Hatchet, MD   cyclobenzaprine (FLEXERIL) 5 mg tablet Take 1 Tab by mouth nightly. 07/02/16   Darren Anselm Pancoast, PA   oxyCODONE-acetaminophen (PERCOCET 10) 10-325 mg per tablet Take  by mouth.  Historical Provider   tiotropium bromide (SPIRIVA RESPIMAT) 1.25 mcg/actuation inhaler Take 2 Puffs by inhalation daily.    Historical Provider   fluticasone (FLOVENT DISKUS) 100 mcg/actuation dsdv Take  by inhalation.    Historical Provider   meloxicam (MOBIC) 15 mg tablet Take 1 Tab by mouth daily. 05/06/16   Rogelio Seen, PA-C   trimethoprim-polymyxin b (POLYTRIM) ophthalmic solution Administer 1 Drop to both eyes every four (4) hours. 05/06/16   Hurman Horn, PA-C   hydrALAZINE (APRESOLINE) 25 mg tablet Take 25 mg by mouth two (2) times a day.    Historical Provider   budesonide-formoterol (SYMBICORT) 80-4.5 mcg/actuation HFAA inhaler Take 2 Puffs by inhalation two (2) times a day.    Historical Provider   ALPRAZolam (XANAX) 0.5 mg tablet TK 1 T PO BID.  MAXIMUM 2 TS D. 01/07/16   Historical Provider   FREESTYLE LITE STRIPS strip USE TO CHECK BLOOD SUGAR BID 03/08/16   Historical Provider   busPIRone (BUSPAR) 15 mg tablet TK 1 T PO TID 03/18/16   Historical Provider   FREESTYLE LANCETS 28 gauge misc CHECK BLOOD SUGAR BID 03/08/16   Historical Provider   JANUMET 50-1,000 mg per tablet TK 1 T PO BID. STOP TAKING METFORMIN. 03/17/16   Historical Provider   pregabalin (LYRICA) 75 mg capsule Take 1 Cap by mouth three (3) times  daily. Max Daily Amount: 225 mg. 03/29/16   Orlena Sheldon, MD   XARELTO 20 mg tab tablet Take 1 Tab by mouth daily. 03/07/16   Historical Provider   amLODIPine (NORVASC) 10 mg tablet Take 1 Tab by mouth daily. 02/07/16   Historical Provider   azelastine (ASTELIN) 137 mcg (0.1 %) nasal spray 1 Spray by Both Nostrils route two (2) times a day. 01/06/16   Historical Provider   DULoxetine (CYMBALTA) 30 mg capsule Take 1 Cap by mouth daily. 03/09/16   Historical Provider   fluticasone (FLONASE) 50 mcg/actuation nasal spray 2 Sprays by Both Nostrils route nightly. 02/16/16   Historical Provider   hydroCHLOROthiazide (HYDRODIURIL) 25 mg tablet Take 1 Tab by mouth daily. 01/02/16   Historical Provider   mirtazapine (REMERON) 30 mg tablet Take 1 Tab by mouth daily. 03/09/16   Historical Provider   montelukast (SINGULAIR) 10 mg tablet Take 1 Tab by mouth daily. 02/16/16   Historical Provider   omeprazole (PRILOSEC) 20 mg capsule Take 1 Cap by mouth daily. 02/02/16   Historical Provider   oxybutynin chloride XL (DITROPAN XL) 10 mg CR tablet Take 1 Tab by mouth daily. 03/08/16   Historical Provider   pravastatin (PRAVACHOL) 40 mg tablet Take 1 Tab by mouth daily. 02/07/16   Historical Provider   zolpidem (AMBIEN) 10 mg tablet Take 1 Tab by mouth nightly. 03/09/16   Historical Provider     Allergies   Allergen Reactions   ??? 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   ??? Almond Other (comments)     Tongue itches   ??? Carvedilol Other (comments)   ??? Cozaar [Losartan] Cough   ??? Crestor [Rosuvastatin] Unknown (comments)     Heart beats fast      Social History   Substance Use Topics   ??? Smoking status: Never Smoker   ??? Smokeless tobacco: Never Used   ??? Alcohol use Yes      Comment: on occasion      Family History   Problem Relation Age of Onset   ??? Diabetes  Mother    ??? Diabetes Brother    ??? Cancer Sister    ??? Cancer Maternal Grandmother    ??? Cancer Daughter       Current Facility-Administered Medications    Medication Dose Route Frequency   ??? vancomycin (VANCOCIN) 1,500 mg in 0.9% sodium chloride 500 mL IVPB  1,500 mg IntraVENous Q12H   ??? [START ON 07/10/2016] Vancomycin trough @ 10:30 prior to 11:00 dose on 8/6. Thanks!   Other ONCE   ??? budesonide (PULMICORT) 500 mcg/2 ml nebulizer suspension  500 mcg Nebulization BID RT   ??? dilTIAZem CD (CARDIZEM CD) capsule 120 mg  120 mg Oral DAILY   ??? tiotropium (SPIRIVA) inhalation capsule 18 mcg  1 Cap Inhalation DAILY   ??? piperacillin-tazobactam (ZOSYN) 3.375 g in 0.9% sodium chloride (MBP/ADV) 100 mL MBP  3.375 g IntraVENous Q6H   ??? azelastine (ASTELIN) 112mcg/spray nasal spray  1 Spray Both Nostrils BID   ??? DULoxetine (CYMBALTA) capsule 30 mg  30 mg Oral DAILY   ??? oxybutynin chloride XL (DITROPAN XL) tablet 10 mg  10 mg Oral DAILY   ??? pregabalin (LYRICA) capsule 75 mg  75 mg Oral TID   ??? rivaroxaban (XARELTO) tablet 20 mg  20 mg Oral DAILY   ??? insulin lispro (HUMALOG) injection   SubCUTAneous AC&HS   ??? pantoprazole (PROTONIX) tablet 40 mg  40 mg Oral ACB       Review of Systems:  Pertinent items are noted in HPI.    Objective:   Vital Signs:    Visit Vitals   ??? BP (!) 152/102 (BP 1 Location: Right arm, BP Patient Position: At rest)   ??? Pulse 98   ??? Temp 99.2 ??F (37.3 ??C)   ??? Resp 18   ??? Ht  (1.626 m)   ??? Wt 78.7 kg (173 lb 9.6 oz)   ??? SpO2 92%   ??? Breastfeeding No   ??? BMI 29.8 kg/m2       O2 Device: Room air   O2 Flow Rate (L/min): 2 l/min   Temp (24hrs), Avg:98.6 ??F (37 ??C), Min:97.8 ??F (36.6 ??C), Max:99.2 ??F (37.3 ??C)       Intake/Output:   Last shift:      08/05 0701 - 08/05 1900  In: 240 [P.O.:240]  Out: -   Last 3 shifts: 08/03 1901 - 08/05 0700  In: 960 [P.O.:960]  Out: 400 [Urine:400]    Intake/Output Summary (Last 24 hours) at 07/09/16 1109  Last data filed at 07/09/16 1610   Gross per 24 hour   Intake              960 ml   Output              400 ml   Net              560 ml      Physical Exam:   General:  Alert, cooperative, no distress, appears stated age.    Head:  Normocephalic, without obvious abnormality, atraumatic.   Eyes:  Conjunctivae/corneas clear. PERRL, EOMs intact.   Nose: Nares normal. Mucosa normal. No drainage or sinus tenderness.   Throat: Lips, mucosa, and tongue normal. Teeth and gums normal.   Neck: Supple, symmetrical, trachea midline, no adenopathy, thyroid: no enlargment/tenderness/nodules, no carotid bruit and no JVD.       Lungs:   Diminished lung sounds noted in lower right base, no wheezing or rales. Good airway entry, no dyspnea.  Chest wall:  No tenderness or deformity.   Heart:  Regular rate and rhythm, S1, S2 normal, no murmur, click, rub or gallop.   Abdomen:   Soft, non-tender. Bowel sounds normal. No masses,  No organomegaly.   Extremities: Extremities normal, atraumatic, no cyanosis. + trace edema.   Pulses: 2+ and symmetric all extremities.   Skin: Skin color, texture, turgor normal. No rashes or lesions       Neurologic: Grossly nonfocal     Data review:     Recent Results (from the past 24 hour(s))   GLUCOSE, POC    Collection Time: 07/08/16 11:19 AM   Result Value Ref Range    Glucose (POC) 161 (H) 70 - 110 mg/dL   GLUCOSE, POC    Collection Time: 07/08/16  3:44 PM   Result Value Ref Range    Glucose (POC) 71 70 - 110 mg/dL   GLUCOSE, POC    Collection Time: 07/08/16 10:13 PM   Result Value Ref Range    Glucose (POC) 129 (H) 70 - 110 mg/dL   METABOLIC PANEL, BASIC    Collection Time: 07/09/16  4:25 AM   Result Value Ref Range    Sodium 139 136 - 145 mmol/L    Potassium 3.6 3.5 - 5.5 mmol/L    Chloride 105 100 - 108 mmol/L    CO2 26 21 - 32 mmol/L    Anion gap 8 3.0 - 18 mmol/L    Glucose 181 (H) 74 - 99 mg/dL    BUN 11 7.0 - 18 MG/DL    Creatinine 8.75 (L) 0.6 - 1.3 MG/DL    BUN/Creatinine ratio 21 (H) 12 - 20      GFR est AA >60 >60 ml/min/1.70m2    GFR est non-AA >60 >60 ml/min/1.41m2    Calcium 9.2 8.5 - 10.1 MG/DL   GLUCOSE, POC    Collection Time: 07/09/16  7:22 AM   Result Value Ref Range     Glucose (POC) 129 (H) 70 - 110 mg/dL     Results for KAYELEIGH, DIPIETRANTONIO (MRN [id number]) as of 07/06/2016 20:52   Ref. Range 11/06/2015 10:32 11/06/2015 14:24 07/05/2016 17:30 07/05/2016 22:32 07/06/2016 02:36   CK Latest Ref Range: 26 - 192 U/L 46 39 1272 (H) 909 (H) 867 (H)     Results for DOLLICIA, ROHMAN (MRN [id number]) as of 07/06/2016 20:52   Ref. Range 02/02/2016 13:15 02/03/2016 06:22 07/03/2016 11:23 07/05/2016 17:30 07/06/2016 02:36   HGB Latest Ref Range: 12.0 - 16.0 g/dL 16.0 10.9 (L) 32.3 55.7 (L) 9.2 (L)       Imaging:  I have personally reviewed the patient???s radiographs and have reviewed the reports:  CXR Results  (Last 48 hours)    None          CT Results  (Last 48 hours)    None           ECHO:  Echo Results  (Last 48 hours)    None          IMPRESSION:   ?? Right lower lobe pneumonia- Probable CAP vs other (Strep Pneumo and Legionella Ag negative)- Failure of OP therapy with levaquin  ?? SVT 8/2-07/07/16  ?? Asthma  ?? Cough  ?? Rhabdomyolosis- mild CPK and Creatinine downtrending  ?? Anemia Hb: 12-->9 : no evidence of acute bleeding on Xarelto - improving today.  ?? Hypokalemia resolved  ?? Hypoalbuminemia  Past Hx  ?? DM2  ?? HTN  ?? PE  2016 - on Xarelto  ?? Depression/Anxiety  ?? Chronic Lower Back Pain - managed by pain clinic  ?? OSA?- final report pending      RECOMMENDATIONS:   ?? Supplemental O2 3L at this time to keep sats > 94% Titrate as tolerated to RA as patient clinically improves.  ?? Continue current IV abx Zosyn and Vancomycin for broad spectrum coverage. - Streamline based on cultures and/or at 72 hrs  ?? Obtain sputum sample if possible.  ?? Increase ambulation  ?? Monitor for leukocytosis and fever.  ?? Broncho hygiene protocol, ICS to bedside  ?? SVT last night will change inhaler medications. Will keep off Symbicort (LABA/ICS) in light of SVT and start Pulmicort (ICS) and change PRN albuterol (SABA) to 1.25 q 6 hrs PRN. Monitor for repeat tachycardia. Continue Spiriva (inhaled anticholinergic).   ?? Encouraged incentive spirometry  ?? Continue anticoagulation - Xarelto. - Trend H/H monitor for bleeding.  ?? Glucose control per primary.              Isabell Jarvis, MD

## 2016-07-10 LAB — CBC WITH AUTOMATED DIFF
ABS. BASOPHILS: 0 10*3/uL (ref 0.0–0.06)
ABS. EOSINOPHILS: 0.2 10*3/uL (ref 0.0–0.4)
ABS. LYMPHOCYTES: 1.3 10*3/uL (ref 0.9–3.6)
ABS. MONOCYTES: 0.6 10*3/uL (ref 0.05–1.2)
ABS. NEUTROPHILS: 3.3 10*3/uL (ref 1.8–8.0)
BASOPHILS: 0 % (ref 0–2)
EOSINOPHILS: 4 % (ref 0–5)
HCT: 31.4 % — ABNORMAL LOW (ref 35.0–45.0)
HGB: 9.8 g/dL — ABNORMAL LOW (ref 12.0–16.0)
LYMPHOCYTES: 23 % (ref 21–52)
MCH: 25.9 PG (ref 24.0–34.0)
MCHC: 31.2 g/dL (ref 31.0–37.0)
MCV: 82.8 FL (ref 74.0–97.0)
MONOCYTES: 12 % — ABNORMAL HIGH (ref 3–10)
MPV: 9.4 FL (ref 9.2–11.8)
NEUTROPHILS: 61 % (ref 40–73)
PLATELET: 411 10*3/uL (ref 135–420)
RBC: 3.79 M/uL — ABNORMAL LOW (ref 4.20–5.30)
RDW: 15.1 % — ABNORMAL HIGH (ref 11.6–14.5)
WBC: 5.5 10*3/uL (ref 4.6–13.2)

## 2016-07-10 LAB — VANCOMYCIN, TROUGH
Reported dose date: 20170805
Reported dose time:: 2300
Reported dose:: 1500 UNITS
Vancomycin,trough: 20.3 ug/mL — CR (ref 10.0–20.0)

## 2016-07-10 LAB — METABOLIC PANEL, COMPREHENSIVE
A-G Ratio: 0.6 — ABNORMAL LOW (ref 0.8–1.7)
ALT (SGPT): 56 U/L (ref 13–56)
AST (SGOT): 30 U/L (ref 15–37)
Albumin: 2.4 g/dL — ABNORMAL LOW (ref 3.4–5.0)
Alk. phosphatase: 71 U/L (ref 45–117)
Anion gap: 5 mmol/L (ref 3.0–18)
BUN/Creatinine ratio: 16 (ref 12–20)
BUN: 9 MG/DL (ref 7.0–18)
Bilirubin, total: 0.4 MG/DL (ref 0.2–1.0)
CO2: 29 mmol/L (ref 21–32)
Calcium: 9.2 MG/DL (ref 8.5–10.1)
Chloride: 108 mmol/L (ref 100–108)
Creatinine: 0.56 MG/DL — ABNORMAL LOW (ref 0.6–1.3)
GFR est AA: >60 mL/min/{1.73_m2} (ref >60)
GFR est non-AA: >60 mL/min/{1.73_m2} (ref >60)
Globulin: 4.2 g/dL — ABNORMAL HIGH (ref 2.0–4.0)
Glucose: 137 mg/dL — ABNORMAL HIGH (ref 74–99)
Potassium: 3.8 mmol/L (ref 3.5–5.5)
Protein, total: 6.6 g/dL (ref 6.4–8.2)
Sodium: 142 mmol/L (ref 136–145)

## 2016-07-10 LAB — CK: CK: 80 U/L (ref 26–192)

## 2016-07-10 LAB — GLUCOSE, POC
Glucose (POC): 132 mg/dL — ABNORMAL HIGH (ref 70–110)
Glucose (POC): 95 mg/dL (ref 70–110)
Glucose (POC): 140 mg/dL — ABNORMAL HIGH (ref 70–110)
Glucose (POC): 90 mg/dL (ref 70–110)

## 2016-07-10 LAB — MAGNESIUM: Magnesium: 2 mg/dL (ref 1.6–2.6)

## 2016-07-10 MED FILL — OXYCODONE-ACETAMINOPHEN 10 MG-325 MG TAB: 10-325 mg | ORAL | Qty: 1

## 2016-07-10 MED FILL — OXYBUTYNIN CHLORIDE SR 5 MG 24 HR TAB: 5 mg | ORAL | Qty: 2

## 2016-07-10 MED FILL — BUDESONIDE 0.5 MG/2 ML NEB SUSPENSION: 0.5 mg/2 mL | RESPIRATORY_TRACT | Qty: 1

## 2016-07-10 MED FILL — CYMBALTA 30 MG CAPSULE,DELAYED RELEASE: 30 mg | ORAL | Qty: 1

## 2016-07-10 MED FILL — PIPERACILLIN-TAZOBACTAM 3.375 GRAM IV SOLR: 3.375 gram | INTRAVENOUS | Qty: 3.38

## 2016-07-10 MED FILL — XARELTO 20 MG TABLET: 20 mg | ORAL | Qty: 1

## 2016-07-10 MED FILL — PANTOPRAZOLE 40 MG TAB, DELAYED RELEASE: 40 mg | ORAL | Qty: 1

## 2016-07-10 MED FILL — DILTIAZEM ER 120 MG 24 HR CAP: 120 mg | ORAL | Qty: 1

## 2016-07-10 MED FILL — LYRICA 75 MG CAPSULE: 75 mg | ORAL | Qty: 1

## 2016-07-10 MED FILL — ZOLPIDEM 5 MG TAB: 5 mg | ORAL | Qty: 2

## 2016-07-10 MED FILL — METFORMIN 500 MG TAB: 500 mg | ORAL | Qty: 2

## 2016-07-10 MED FILL — JANUVIA 50 MG TABLET: 50 mg | ORAL | Qty: 2

## 2016-07-10 MED FILL — VANCOMYCIN 10 GRAM IV SOLR: 10 gram | INTRAVENOUS | Qty: 1500

## 2016-07-10 NOTE — Other (Signed)
Patient sitting on side of bed talking on phone. Requests medication for pain at this time.

## 2016-07-10 NOTE — Progress Notes (Signed)
PT eval order received and chart reviewed. Pt already on caseload, will continue to follow. Judd LienMarissa Conti, PT, DPT.

## 2016-07-10 NOTE — Progress Notes (Addendum)
Progress Note    Patient: Cynthia PorteousFrankie M Marsh MRN: [id number]  CSN: [id number]    Date of Birth: 11/10/1951  Age: 65 y.o.  Sex: female    DOA: 07/05/2016 LOS:  LOS: 5 days                    Subjective:   Pt continue to improve  Pt  has OSA test waiting for c-pap machine pt has been on o2 by NC 2 L/M cough improved no chest pain has been c/o frequency and incontinence pt has been on oxybutynin before admission     Pt tolerating her diet . Pt had pt and ot evaluation with recommendation to go home with home health pt still on IV ABT.    Pt can be off 0 2 by NC in Am use O 2 at night until she get c-pap. CPK back to normal off cholesterol medication     Hospital course  65 y.o. female with h/o CKD, Asthma, HTN, PE 2016, and chronic back pain who presentedto ED after evaluation at our office yesterday  She has been  complaining of chest pain and SOB onset Sunday  Before admission  but worsened over the last 2 days before admission . Pt was seen at the ER twice last week end diagnosed with pneumonia and UTI  Pt started on Levaquin as outpatient without much response   ??  Pt has h/o chronic back pain   Pt has been f/u spine center she had MRI of the Thoracic and lumbar spine to rule out discitis/ epidural abscess. Has chronic Lumbar back pain and is on pain management. MRI 07/03/16 thoracic spine showed pneumonia   1. Evidence of pneumonia in the right greater than left lower lobe.  2. Lower thoracic paravertebral enhancement felt to be related to vessels and  abutting reactive changes. No acute thoracic spine findings. Follow-up CT  imaging with contrast can be performed to exclude acute infectious paraspinal process here.  ??   pt was c/o cough , fatigue at the office and has been feeling getting worse pt was sent back to ER WBC, CPK was elevated 1272 troponin I normal repeat CTA showed   Less than optimal contrast bolus. Proximal pulmonary arteries are well   opacified, more distal vessels not as well opacified limiting evaluation for  pulmonary embolus beyond the lobar branches.  ????  Within the limitations of the exam there is no finding of central pulmonary  embolus.  ????  Lobar consolidation in the posterior right lower lobe consistent with pneumonia.  Right middle lobe opacity that may be atelectasis or infection.   ????   ??  Pt was started on zosyn and vancomycin urine stret AG and legionella negative . Pt is feeling better today still has some soreness in the chest from coughing   ??  Pt has H/O chronic cough/ asthma   for 6 months had APPT with Dr Neale Burlyfreeman before admission  but she couldn't make it    8/2 pt  had RRT for SVT with HR 180s, patient was asymptomatic with normal BP during the episode, started on diltiazem drip that initially helped but then resumed HR 170s, given adenosine 6mg  x1 after pre-treatment with ativan and HR has been 90-110s since without further SVT.  Cardiology consulted.      Chief Complaint:   Chief Complaint   Patient presents with   ??? Shortness of Breath   ??? Chest Pain  Review of systems  General: No fevers or chills.  Cardiovascular: No chest pain or pressure. No palpitations.   Pulmonary: No shortness of breath. No cough   Gastrointestinal: No abdominal pain, nausea, vomiting or diarrhea.   Genitourinary: No urinary frequency, urgency, hesitancy or dysuria.   Musculoskeletal: No joint or muscle pain, no back pain, no recent trauma.    Neurologic: No headache, numbness, tingling or weakness.     Objective:     Physical Exam:  Visit Vitals   ??? BP 123/79 (BP 1 Location: Left arm, BP Patient Position: At rest)   ??? Pulse (!) 102   ??? Temp 97.8 ??F (36.6 ??C)   ??? Resp 18   ??? Ht 5\' 4"  (1.626 m)   ??? Wt 82.5 kg (181 lb 14.4 oz)   ??? SpO2 96%   ??? Breastfeeding No   ??? BMI 31.22 kg/m2        General:         Alert, cooperative, no acute distress????  HEENT: NC, Atraumatic.  PERRLA, anicteric sclerae.   Lungs: Rales bilateral lung fields, most notably RLL.  Heart:  Regular  rhythm,?? No murmur, No Rubs, No Gallops, slightly tachycardic.  Abdomen: Soft, Non distended, Non tender. ??+Bowel sounds, no HSM  Extremities: Trace lower limb edema  Psych:???? Not anxious or agitated.  Neurologic:?? CN 2-12 grossly intact, Alert and oriented X 3.  No acute neurological                                 Deficits,     Intake and Output:  Current Shift:  08/06 0701 - 08/06 1900  In: 360 [P.O.:360]  Out: -   Last three shifts:  08/04 1901 - 08/06 0700  In: 2280 [P.O.:1080; I.V.:1200]  Out: 0     Labs: Results:       Chemistry Recent Labs      07/10/16   0322  07/09/16   0425  07/08/16   0615   GLU  137*  181*  140*   NA  142  139  141   K  3.8  3.6  3.7   CL  108  105  106   CO2  29  26  28    BUN  9  11  11    CREA  0.56*  0.53*  0.47*   CA  9.2  9.2  8.9   AGAP  5  8  7    BUCR  16  21*  23*   AP  71   --    --    TP  6.6   --    --    ALB  2.4*   --    --    GLOB  4.2*   --    --    AGRAT  0.6*   --    --       CBC w/Diff Recent Labs      07/10/16   0322   WBC  5.5   RBC  3.79*   HGB  9.8*   HCT  31.4*   PLT  411   GRANS  61   LYMPH  23   EOS  4      Cardiac Enzymes Recent Labs      07/10/16   0322   CPK  80      Coagulation No results for input(s): PTP,  INR, APTT in the last 72 hours.    No lab exists for component: INREXT, INREXT    Lipid Panel Lab Results   Component Value Date/Time    Cholesterol, total 113 07/06/2016 02:36 AM    HDL Cholesterol 21 07/06/2016 02:36 AM    LDL, calculated 62.2 07/06/2016 02:36 AM    VLDL, calculated 29.8 07/06/2016 02:36 AM    Triglyceride 149 07/06/2016 02:36 AM    CHOL/HDL Ratio 5.4 07/06/2016 02:36 AM      BNP No results for input(s): BNPP in the last 72 hours.   Liver Enzymes Recent Labs      07/10/16   0322   TP  6.6   ALB  2.4*   AP  71   SGOT  30      Thyroid Studies Lab Results   Component Value Date/Time    TSH 1.62 07/07/2016 02:00 AM           Procedures/imaging: see electronic medical records for all procedures/Xrays and details which were not copied into this note but were reviewed prior to creation of Plan    Prior to Admission Medications   Prescriptions Last Dose Informant Patient Reported? Taking?   ALPRAZolam (XANAX) 0.5 mg tablet   Yes No   Sig: TK 1 T PO BID.  MAXIMUM 2 TS D.   DULoxetine (CYMBALTA) 30 mg capsule   Yes No   Sig: Take 1 Cap by mouth daily.   FREESTYLE LANCETS 28 gauge misc   Yes No   Sig: CHECK BLOOD SUGAR BID   FREESTYLE LITE STRIPS strip   Yes No   Sig: USE TO CHECK BLOOD SUGAR BID   JANUMET 50-1,000 mg per tablet   Yes No   Sig: TK 1 T PO BID. STOP TAKING METFORMIN.   XARELTO 20 mg tab tablet   Yes No   Sig: Take 1 Tab by mouth daily.   amLODIPine (NORVASC) 10 mg tablet   Yes No   Sig: Take 1 Tab by mouth daily.   azelastine (ASTELIN) 137 mcg (0.1 %) nasal spray   Yes No   Sig: 1 Spray by Both Nostrils route two (2) times a day.   budesonide-formoterol (SYMBICORT) 80-4.5 mcg/actuation HFAA inhaler   Yes No   Sig: Take 2 Puffs by inhalation two (2) times a day.   busPIRone (BUSPAR) 15 mg tablet   Yes No   Sig: TK 1 T PO TID   cyclobenzaprine (FLEXERIL) 5 mg tablet   No No   Sig: Take 1 Tab by mouth nightly.   fluticasone (FLONASE) 50 mcg/actuation nasal spray   Yes No   Sig: 2 Sprays by Both Nostrils route nightly.   fluticasone (FLOVENT DISKUS) 100 mcg/actuation dsdv   Yes No   Sig: Take  by inhalation.   hydrALAZINE (APRESOLINE) 25 mg tablet   Yes No   Sig: Take 25 mg by mouth two (2) times a day.   hydroCHLOROthiazide (HYDRODIURIL) 25 mg tablet   Yes No   Sig: Take 1 Tab by mouth daily.   levoFLOXacin (LEVAQUIN) 750 mg tablet   No No   Sig: Take 1 Tab by mouth daily for 10 days.   meloxicam (MOBIC) 15 mg tablet   No No   Sig: Take 1 Tab by mouth daily.   mirtazapine (REMERON) 30 mg tablet   Yes No   Sig: Take 1 Tab by mouth daily.   montelukast (SINGULAIR) 10 mg tablet   Yes No  Sig: Take 1 Tab by mouth daily.    omeprazole (PRILOSEC) 20 mg capsule   Yes No   Sig: Take 1 Cap by mouth daily.   oxyCODONE-acetaminophen (PERCOCET 10) 10-325 mg per tablet   Yes No   Sig: Take  by mouth.   oxybutynin chloride XL (DITROPAN XL) 10 mg CR tablet   Yes No   Sig: Take 1 Tab by mouth daily.   pravastatin (PRAVACHOL) 40 mg tablet   Yes No   Sig: Take 1 Tab by mouth daily.   pregabalin (LYRICA) 75 mg capsule   No No   Sig: Take 1 Cap by mouth three (3) times daily. Max Daily Amount: 225 mg.   tiotropium bromide (SPIRIVA RESPIMAT) 1.25 mcg/actuation inhaler   Yes No   Sig: Take 2 Puffs by inhalation daily.   trimethoprim-polymyxin b (POLYTRIM) ophthalmic solution   No No   Sig: Administer 1 Drop to both eyes every four (4) hours.   zolpidem (AMBIEN) 10 mg tablet   Yes No   Sig: Take 1 Tab by mouth nightly.      Facility-Administered Medications: None      Medications:   Current Facility-Administered Medications   Medication Dose Route Frequency   ??? metFORMIN (GLUCOPHAGE) tablet 1,000 mg  1,000 mg Oral BID WITH MEALS   ??? SITagliptin (JANUVIA) tablet 100 mg  100 mg Oral DAILY   ??? budesonide (PULMICORT) 500 mcg/2 ml nebulizer suspension  500 mcg Nebulization BID RT   ??? albuterol (ACCUNEB) nebulizer solution 1.25 mg  1.25 mg Nebulization Q6H PRN   ??? dilTIAZem CD (CARDIZEM CD) capsule 120 mg  120 mg Oral DAILY   ??? tiotropium (SPIRIVA) inhalation capsule 18 mcg  1 Cap Inhalation DAILY   ??? piperacillin-tazobactam (ZOSYN) 3.375 g in 0.9% sodium chloride (MBP/ADV) 100 mL MBP  3.375 g IntraVENous Q6H   ??? azelastine (ASTELIN) 121mcg/spray nasal spray  1 Spray Both Nostrils BID   ??? DULoxetine (CYMBALTA) capsule 30 mg  30 mg Oral DAILY   ??? hydrALAZINE (APRESOLINE) tablet 25 mg  25 mg Oral TID PRN   ??? oxybutynin chloride XL (DITROPAN XL) tablet 10 mg  10 mg Oral DAILY   ??? oxyCODONE-acetaminophen (PERCOCET 10) 10-325 mg per tablet 1 Tab  1 Tab Oral Q6H PRN   ??? pregabalin (LYRICA) capsule 75 mg  75 mg Oral TID    ??? rivaroxaban (XARELTO) tablet 20 mg  20 mg Oral DAILY   ??? zolpidem (AMBIEN) tablet 10 mg  10 mg Oral QHS PRN   ??? insulin lispro (HUMALOG) injection   SubCUTAneous AC&HS   ??? pantoprazole (PROTONIX) tablet 40 mg  40 mg Oral ACB       Assessment/Plan     Active Problems:    Depression ()      Anxiety ()      Mild intermittent asthma without complication (04/20/2015)      Type 2 diabetes mellitus without complication (HCC) (04/20/2015)      Hx of spinal fusion (08/17/2015)      Dyslipidemia (04/27/2016)      Community acquired pneumonia (07/05/2016)      Rhabdomyolysis (07/05/2016)      Pneumonia (07/05/2016)      Plan:  ??   Community Acquired pneumonia failed outpatient treatment with PO levaquin  Continue Zosyn and vancomycin (8/2-  respiratory culture  No result and blood culture urine culture negative  Urine strept Ag and Legionella AG negative  Albuterol 1.25  nebulizer q 4h  prn  Swallow evaluation  Switch Symbicort to Pulmicort nebulizer after SVT   F/u pulmonary consult   ??  SVT  Resolved, now sinus tachycardia  Echo EJF 60 % no wall motion abnormalities   Pt on oral Cardizem CD 120 mg daily    Asthma  Pulmicort q 12 h   Albuterol 1.25  nebulizer q 4 h prn  ??  ??  Rhabdomyolysis   Mild, Improved with hydration in the ER  Holding Pravachol  Recheck CPK  ??  Hypokalemia  Resolved with supplementation, monitor and repleate as needed  ??  UTI  Culture is negative  ??  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  Hypertension  Hydralazine prn for SBP above 160  Cardizem Cd 120 mg daily  ??  Hyperlipidemia  Hold Pravachol as above  ??  Diabetes Mellitus type 2  Sliding scale coverage  Check HG A1c 7%  Restart  Janumet continue to monitor with Humalog  ??    Continue Pt and ot evaluation and treatment  Continue to monitor lab cbc, cmp, mag   Discussed with Dr Mcarthur Rossetti will keep IV ABT for more 2 days      DVT/GI Prophylaxis: H2B/PPI and Xarelto and protonix      Lenn Sink, MD   07/10/2016 2:32  PM

## 2016-07-10 NOTE — Other (Signed)
Bedside and Verbal shift change report given to Charlene RN (oncoming nurse) by J.paxton (offgoing nurse). Report included the following information Kardex, Intake/Output and MAR.

## 2016-07-10 NOTE — Progress Notes (Addendum)
Pt needs CPAP machine. Orders faxed to [facility]. Called [facility] answering service and spoke to provider on call who states the driver will be calling CM back today.     1330 - Spoke to [place]driver with [facility] who states the earliest the CPAP can be delivered is tomorrow.

## 2016-07-10 NOTE — Other (Signed)
Bedside shift report received from Dimas Millinammy Scott, RN. Call light within reach. No needs noted at this time.

## 2016-07-10 NOTE — Progress Notes (Addendum)
[facility]  Pulmonary, Critical Care, and Sleep Medicine    Pulmonary Progress Note    Name: Cynthia Marsh MRN: [id number]   DOB: 1951/03/09 Hospital: [facility]   Date: 07/10/2016          Subjective:    Patient is a 65 y.o. female who presented to the ED with complaints of shortness of breath and pneumonia and lower back pain who was admitted. CTA done, negative for PE. Patient started on abx for pneumonia.     07/10/16  ?? Tolerated breakfast  ?? Patient stated that her breathing is better and that her back also feels better as well.  ?? Patient was rapid responded 8/3 for SVT. Was given adenosine and Cardizem. She is now on po Cardizem. Currently heart rate is controlled. EKG during event negative for A-fib. Cardiology following  ?? Afebrile, Normotensive  ?? Denies nausea, vomiting, dizziness, headache.    Past Medical History:   Diagnosis Date   ??? Anxiety    ??? Asthma    ??? 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 Cardiac echocardiogram 11/07/2015    EF 60%.  No RWMA.  Gr 1 DDfx.  RVSP 30 mmHg.     ??? Cardiovascular lower extremity venous duplex 11/06/2015    No DVT bilaterally.   ??? Chronic kidney disease    ??? Depression    ??? Developmental delay    ??? Diabetes ([place])    ??? Diabetes mellitus ([place])    ??? History of nuclear stress test 03/2016    No ischemia, no infarct, EF 67%.  Low risk study   ??? Hypertension    ??? Insomnia    ??? Osteoporosis    ??? Pneumonia 2010   ??? Thromboembolus [facility])       Past Surgical History:   Procedure Laterality Date   ??? BREAST SURGERY PROCEDURE UNLISTED       Left cyst removed    ??? HAND/FINGER SURGERY UNLISTED     ??? HX BACK SURGERY  2012    L4/L5/S1 fused in [place]   ??? HX CYST INCISION AND DRAINAGE Left    ??? HX CYST REMOVAL      calf    ??? HX GYN      complete hyst    ??? HX GYN      tubal ligation   ??? HX HYSTERECTOMY     ??? HX KNEE ARTHROSCOPY     ??? HX OOPHORECTOMY     ??? HX ORTHOPAEDIC      Right shoulder rotator cuff    ??? HX ORTHOPAEDIC       Left Little toe hammer toe, left big toe bunion removed   ??? HX ORTHOPAEDIC      Right knee torn meniscus   ??? HX ORTHOPAEDIC      CTS bilateral , Left thumb trigger finger    ??? HX WRIST FRACTURE TX     ??? LAMINECTOMY,CERVICAL     ??? PR ANESTH,SURGERY OF SHOULDER        Prior to Admission medications    Medication Sig Start Date End Date Taking? Authorizing Provider   levoFLOXacin (LEVAQUIN) 750 mg tablet Take 1 Tab by mouth daily for 10 days. 07/03/16 07/13/16  Evelena Leyden, MD   cyclobenzaprine (FLEXERIL) 5 mg tablet Take 1 Tab by mouth nightly. 07/02/16   Darren Doristine Bosworth, PA   oxyCODONE-acetaminophen (PERCOCET 10) 10-325 mg per tablet Take  by mouth.  Historical Provider   tiotropium bromide (SPIRIVA RESPIMAT) 1.25 mcg/actuation inhaler Take 2 Puffs by inhalation daily.    Historical Provider   fluticasone (FLOVENT DISKUS) 100 mcg/actuation dsdv Take  by inhalation.    Historical Provider   meloxicam (MOBIC) 15 mg tablet Take 1 Tab by mouth daily. 05/06/16   Rogelio Seen, PA-C   trimethoprim-polymyxin b (POLYTRIM) ophthalmic solution Administer 1 Drop to both eyes every four (4) hours. 05/06/16   Hurman Horn, PA-C   hydrALAZINE (APRESOLINE) 25 mg tablet Take 25 mg by mouth two (2) times a day.    Historical Provider   budesonide-formoterol (SYMBICORT) 80-4.5 mcg/actuation HFAA inhaler Take 2 Puffs by inhalation two (2) times a day.    Historical Provider   ALPRAZolam (XANAX) 0.5 mg tablet TK 1 T PO BID.  MAXIMUM 2 TS D. 01/07/16   Historical Provider   FREESTYLE LITE STRIPS strip USE TO CHECK BLOOD SUGAR BID 03/08/16   Historical Provider   busPIRone (BUSPAR) 15 mg tablet TK 1 T PO TID 03/18/16   Historical Provider   FREESTYLE LANCETS 28 gauge misc CHECK BLOOD SUGAR BID 03/08/16   Historical Provider   JANUMET 50-1,000 mg per tablet TK 1 T PO BID. STOP TAKING METFORMIN. 03/17/16   Historical Provider   pregabalin (LYRICA) 75 mg capsule Take 1 Cap by mouth three (3) times  daily. Max Daily Amount: 225 mg. 03/29/16   Orlena Sheldon, MD   XARELTO 20 mg tab tablet Take 1 Tab by mouth daily. 03/07/16   Historical Provider   amLODIPine (NORVASC) 10 mg tablet Take 1 Tab by mouth daily. 02/07/16   Historical Provider   azelastine (ASTELIN) 137 mcg (0.1 %) nasal spray 1 Spray by Both Nostrils route two (2) times a day. 01/06/16   Historical Provider   DULoxetine (CYMBALTA) 30 mg capsule Take 1 Cap by mouth daily. 03/09/16   Historical Provider   fluticasone (FLONASE) 50 mcg/actuation nasal spray 2 Sprays by Both Nostrils route nightly. 02/16/16   Historical Provider   hydroCHLOROthiazide (HYDRODIURIL) 25 mg tablet Take 1 Tab by mouth daily. 01/02/16   Historical Provider   mirtazapine (REMERON) 30 mg tablet Take 1 Tab by mouth daily. 03/09/16   Historical Provider   montelukast (SINGULAIR) 10 mg tablet Take 1 Tab by mouth daily. 02/16/16   Historical Provider   omeprazole (PRILOSEC) 20 mg capsule Take 1 Cap by mouth daily. 02/02/16   Historical Provider   oxybutynin chloride XL (DITROPAN XL) 10 mg CR tablet Take 1 Tab by mouth daily. 03/08/16   Historical Provider   pravastatin (PRAVACHOL) 40 mg tablet Take 1 Tab by mouth daily. 02/07/16   Historical Provider   zolpidem (AMBIEN) 10 mg tablet Take 1 Tab by mouth nightly. 03/09/16   Historical Provider     Allergies   Allergen Reactions   ??? 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   ??? Almond Other (comments)     Tongue itches   ??? Carvedilol Other (comments)   ??? Cozaar [Losartan] Cough   ??? Crestor [Rosuvastatin] Unknown (comments)     Heart beats fast      Social History   Substance Use Topics   ??? Smoking status: Never Smoker   ??? Smokeless tobacco: Never Used   ??? Alcohol use Yes      Comment: on occasion      Family History   Problem Relation Age of Onset   ??? Diabetes  Mother    ??? Diabetes Brother    ??? Cancer Sister    ??? Cancer Maternal Grandmother    ??? Cancer Daughter       Current Facility-Administered Medications    Medication Dose Route Frequency   ??? metFORMIN (GLUCOPHAGE) tablet 1,000 mg  1,000 mg Oral BID WITH MEALS   ??? SITagliptin (JANUVIA) tablet 100 mg  100 mg Oral DAILY   ??? vancomycin (VANCOCIN) 1,500 mg in 0.9% sodium chloride 500 mL IVPB  1,500 mg IntraVENous Q12H   ??? Vancomycin trough @ 10:30 prior to 11:00 dose on 8/6. Thanks!   Other ONCE   ??? budesonide (PULMICORT) 500 mcg/2 ml nebulizer suspension  500 mcg Nebulization BID RT   ??? dilTIAZem CD (CARDIZEM CD) capsule 120 mg  120 mg Oral DAILY   ??? tiotropium (SPIRIVA) inhalation capsule 18 mcg  1 Cap Inhalation DAILY   ??? piperacillin-tazobactam (ZOSYN) 3.375 g in 0.9% sodium chloride (MBP/ADV) 100 mL MBP  3.375 g IntraVENous Q6H   ??? azelastine (ASTELIN) 173mg/spray nasal spray  1 Spray Both Nostrils BID   ??? DULoxetine (CYMBALTA) capsule 30 mg  30 mg Oral DAILY   ??? oxybutynin chloride XL (DITROPAN XL) tablet 10 mg  10 mg Oral DAILY   ??? pregabalin (LYRICA) capsule 75 mg  75 mg Oral TID   ??? rivaroxaban (XARELTO) tablet 20 mg  20 mg Oral DAILY   ??? insulin lispro (HUMALOG) injection   SubCUTAneous AC&HS   ??? pantoprazole (PROTONIX) tablet 40 mg  40 mg Oral ACB       Review of Systems:  Pertinent items are noted in HPI.    Objective:   Vital Signs:    Visit Vitals   ??? BP (!) 144/97 (BP 1 Location: Left arm, BP Patient Position: At rest)   ??? Pulse 84   ??? Temp 98.4 ??F (36.9 ??C)   ??? Resp 18   ??? Ht '5\' 4"'$  (1.626 m)   ??? Wt 82.5 kg (181 lb 14.4 oz)   ??? SpO2 98%   ??? Breastfeeding No   ??? BMI 31.22 kg/m2       O2 Device: Nasal cannula   O2 Flow Rate (L/min): 2 l/min   Temp (24hrs), Avg:98.2 ??F (36.8 ??C), Min:97.8 ??F (36.6 ??C), Max:98.6 ??F (37 ??C)       Intake/Output:   Last shift:      08/06 0701 - 08/06 1900  In: 360 [P.O.:360]  Out: -   Last 3 shifts: 08/04 1901 - 08/06 0700  In: 2280 [P.O.:1080; I.V.:1200]  Out: 0     Intake/Output Summary (Last 24 hours) at 07/10/16 1050  Last data filed at 07/10/16 0955   Gross per 24 hour   Intake             1560 ml    Output                0 ml   Net             1560 ml      Physical Exam:   General:  Alert, cooperative, no distress, appears stated age.   Head:  Normocephalic, without obvious abnormality, atraumatic.   Eyes:  Conjunctivae/corneas clear. PERRL, EOMs intact.   Nose: Nares normal. Mucosa normal. No drainage or sinus tenderness.   Throat: Lips, mucosa, and tongue normal. Teeth and gums normal.   Neck: Supple, symmetrical, trachea midline, no adenopathy, thyroid: no enlargment/tenderness/nodules, no carotid bruit and no JVD.  Lungs:   Diminished lung sounds noted in lower right base, no wheezing or rales. Good airway entry, no dyspnea.    Chest wall:  No tenderness or deformity.   Heart:  Regular rate and rhythm, S1, S2 normal, no murmur, click, rub or gallop.   Abdomen:   Soft, non-tender. Bowel sounds normal. No masses,  No organomegaly.   Extremities: Extremities normal, atraumatic, no cyanosis. + trace edema.   Pulses: 2+ and symmetric all extremities.   Skin: Skin color, texture, turgor normal. No rashes or lesions       Neurologic: Grossly nonfocal     Data review:     Recent Results (from the past 24 hour(s))   GLUCOSE, POC    Collection Time: 07/09/16 11:25 AM   Result Value Ref Range    Glucose (POC) 224 (H) 70 - 110 mg/dL   GLUCOSE, POC    Collection Time: 07/09/16  5:05 PM   Result Value Ref Range    Glucose (POC) 82 70 - 110 mg/dL   GLUCOSE, POC    Collection Time: 07/09/16 11:03 PM   Result Value Ref Range    Glucose (POC) 132 (H) 70 - 110 mg/dL   CBC WITH AUTOMATED DIFF    Collection Time: 07/10/16  3:22 AM   Result Value Ref Range    WBC 5.5 4.6 - 13.2 K/uL    RBC 3.79 (L) 4.20 - 5.30 M/uL    HGB 9.8 (L) 12.0 - 16.0 g/dL    HCT 31.4 (L) 35.0 - 45.0 %    MCV 82.8 74.0 - 97.0 FL    MCH 25.9 24.0 - 34.0 PG    MCHC 31.2 31.0 - 37.0 g/dL    RDW 15.1 (H) 11.6 - 14.5 %    PLATELET 411 135 - 420 K/uL    MPV 9.4 9.2 - 11.8 FL    NEUTROPHILS 61 40 - 73 %    LYMPHOCYTES 23 21 - 52 %     MONOCYTES 12 (H) 3 - 10 %    EOSINOPHILS 4 0 - 5 %    BASOPHILS 0 0 - 2 %    ABS. NEUTROPHILS 3.3 1.8 - 8.0 K/UL    ABS. LYMPHOCYTES 1.3 0.9 - 3.6 K/UL    ABS. MONOCYTES 0.6 0.05 - 1.2 K/UL    ABS. EOSINOPHILS 0.2 0.0 - 0.4 K/UL    ABS. BASOPHILS 0.0 0.0 - 0.06 K/UL    DF AUTOMATED     METABOLIC PANEL, COMPREHENSIVE    Collection Time: 07/10/16  3:22 AM   Result Value Ref Range    Sodium 142 136 - 145 mmol/L    Potassium 3.8 3.5 - 5.5 mmol/L    Chloride 108 100 - 108 mmol/L    CO2 29 21 - 32 mmol/L    Anion gap 5 3.0 - 18 mmol/L    Glucose 137 (H) 74 - 99 mg/dL    BUN 9 7.0 - 18 MG/DL    Creatinine 0.56 (L) 0.6 - 1.3 MG/DL    BUN/Creatinine ratio 16 12 - 20      GFR est AA >60 >60 ml/min/1.21m    GFR est non-AA >60 >60 ml/min/1.77m   Calcium 9.2 8.5 - 10.1 MG/DL    Bilirubin, total 0.4 0.2 - 1.0 MG/DL    ALT (SGPT) 56 13 - 56 U/L    AST (SGOT) 30 15 - 37 U/L    Alk. phosphatase 71 45 - 117 U/L  Protein, total 6.6 6.4 - 8.2 g/dL    Albumin 2.4 (L) 3.4 - 5.0 g/dL    Globulin 4.2 (H) 2.0 - 4.0 g/dL    A-G Ratio 0.6 (L) 0.8 - 1.7     MAGNESIUM    Collection Time: 07/10/16  3:22 AM   Result Value Ref Range    Magnesium 2.0 1.6 - 2.6 mg/dL   CK    Collection Time: 07/10/16  3:22 AM   Result Value Ref Range    CK 80 26 - 192 U/L   GLUCOSE, POC    Collection Time: 07/10/16  7:14 AM   Result Value Ref Range    Glucose (POC) 95 70 - 110 mg/dL     Results for JERELENE, SALAAM (MRN [id number]) as of 07/06/2016 20:52   Ref. Range 11/06/2015 10:32 11/06/2015 14:24 07/05/2016 17:30 07/05/2016 22:32 07/06/2016 02:36   CK Latest Ref Range: 26 - 192 U/L 46 39 1272 (H) 909 (H) 867 (H)     Results for LILYBETH, VIEN (MRN [id number]) as of 07/06/2016 20:52   Ref. Range 02/02/2016 13:15 02/03/2016 06:22 07/03/2016 11:23 07/05/2016 17:30 07/06/2016 02:36   HGB Latest Ref Range: 12.0 - 16.0 g/dL 12.4 11.9 (L) 12.2 10.7 (L) 9.2 (L)       Imaging:  I have personally reviewed the patient???s radiographs and have reviewed the reports:   CXR Results  (Last 48 hours)    None          CT Results  (Last 48 hours)    None           ECHO:  Echo Results  (Last 48 hours)    None          IMPRESSION:   ?? Right lower lobe pneumonia- Probable CAP vs other (Strep Pneumo and Legionella Ag negative)- Failure of OP therapy with levaquin  ?? SVT 8/2-07/07/16  ?? Asthma  ?? Cough  ?? Rhabdomyolosis- both Creatinine and CPK normalized  ?? Anemia Hb: 12-->9 : no evidence of acute bleeding on Xarelto - improving today.  ?? Hypokalemia resolved  ?? Hypoalbuminemia  Past Hx  ?? DM2  ?? HTN  ?? PE 2016 - on Xarelto  ?? Depression/Anxiety  ?? Chronic Lower Back Pain - managed by pain clinic  ?? OSA?- final report pending      RECOMMENDATIONS:   ?? Discontinue daytime O2  ?? Will need outpatient CPAP titration. Pt with previous diagnosis of OSA and not on CPAP at home  ?? Continue  IV abx Zosyn and discontinue Vancomycin . Streamline further based on results  ?? Obtain sputum sample if possible.  ?? Increase ambulation  ?? Monitor for leukocytosis and fever.  ?? Bronchial hygiene protocol   ?? No SVT overnight. Will keep off Symbicort (LABA/ICS) in light of SVT and start Pulmicort (ICS) and change PRN albuterol (SABA) to 1.25 q 6 hrs PRN. Monitor for repeat tachycardia. Continue Spiriva (inhaled anticholinergic).  ?? Encouraged incentive spirometry  ?? Continue anticoagulation - Xarelto. - Trend H/H monitor for bleeding.  ?? Glucose control per primary.              Amada Kingfisher, MD

## 2016-07-11 LAB — GLUCOSE, POC
Glucose (POC): 115 mg/dL — ABNORMAL HIGH (ref 70–110)
Glucose (POC): 103 mg/dL (ref 70–110)
Glucose (POC): 105 mg/dL (ref 70–110)
Glucose (POC): 90 mg/dL (ref 70–110)

## 2016-07-11 LAB — METABOLIC PANEL, COMPREHENSIVE
A-G Ratio: 0.6 — ABNORMAL LOW (ref 0.8–1.7)
ALT (SGPT): 44 U/L (ref 13–56)
AST (SGOT): 20 U/L (ref 15–37)
Albumin: 2.6 g/dL — ABNORMAL LOW (ref 3.4–5.0)
Alk. phosphatase: 72 U/L (ref 45–117)
Anion gap: 6 mmol/L (ref 3.0–18)
BUN/Creatinine ratio: 11 — ABNORMAL LOW (ref 12–20)
BUN: 8 MG/DL (ref 7.0–18)
Bilirubin, total: 0.1 MG/DL — ABNORMAL LOW (ref 0.2–1.0)
CO2: 30 mmol/L (ref 21–32)
Calcium: 9.3 MG/DL (ref 8.5–10.1)
Chloride: 106 mmol/L (ref 100–108)
Creatinine: 0.74 MG/DL (ref 0.6–1.3)
GFR est AA: >60 mL/min/{1.73_m2} (ref >60)
GFR est non-AA: >60 mL/min/{1.73_m2} (ref >60)
Globulin: 4.7 g/dL — ABNORMAL HIGH (ref 2.0–4.0)
Glucose: 123 mg/dL — ABNORMAL HIGH (ref 74–99)
Potassium: 4.1 mmol/L (ref 3.5–5.5)
Protein, total: 7.3 g/dL (ref 6.4–8.2)
Sodium: 142 mmol/L (ref 136–145)

## 2016-07-11 LAB — CBC WITH AUTOMATED DIFF
ABS. BASOPHILS: 0 10*3/uL (ref 0.0–0.1)
ABS. EOSINOPHILS: 0.2 10*3/uL (ref 0.0–0.4)
ABS. LYMPHOCYTES: 1.4 10*3/uL (ref 0.9–3.6)
ABS. MONOCYTES: 0.8 10*3/uL (ref 0.05–1.2)
ABS. NEUTROPHILS: 3.5 10*3/uL (ref 1.8–8.0)
BASOPHILS: 0 % (ref 0–2)
EOSINOPHILS: 3 % (ref 0–5)
HCT: 32 % — ABNORMAL LOW (ref 35.0–45.0)
HGB: 10.1 g/dL — ABNORMAL LOW (ref 12.0–16.0)
LYMPHOCYTES: 24 % (ref 21–52)
MCH: 26 PG (ref 24.0–34.0)
MCHC: 31.6 g/dL (ref 31.0–37.0)
MCV: 82.3 FL (ref 74.0–97.0)
MONOCYTES: 14 % — ABNORMAL HIGH (ref 3–10)
MPV: 9.2 FL (ref 9.2–11.8)
NEUTROPHILS: 59 % (ref 40–73)
PLATELET: 482 10*3/uL — ABNORMAL HIGH (ref 135–420)
RBC: 3.89 M/uL — ABNORMAL LOW (ref 4.20–5.30)
RDW: 15.3 % — ABNORMAL HIGH (ref 11.6–14.5)
WBC: 5.9 10*3/uL (ref 4.6–13.2)

## 2016-07-11 LAB — MAGNESIUM: Magnesium: 2.1 mg/dL (ref 1.6–2.6)

## 2016-07-11 MED FILL — BUDESONIDE 0.5 MG/2 ML NEB SUSPENSION: 0.5 mg/2 mL | RESPIRATORY_TRACT | Qty: 1

## 2016-07-11 MED FILL — XARELTO 20 MG TABLET: 20 mg | ORAL | Qty: 1

## 2016-07-11 MED FILL — LYRICA 75 MG CAPSULE: 75 mg | ORAL | Qty: 1

## 2016-07-11 MED FILL — PIPERACILLIN-TAZOBACTAM 3.375 GRAM IV SOLR: 3.375 gram | INTRAVENOUS | Qty: 3.38

## 2016-07-11 MED FILL — OXYCODONE-ACETAMINOPHEN 10 MG-325 MG TAB: 10-325 mg | ORAL | Qty: 1

## 2016-07-11 MED FILL — METFORMIN 500 MG TAB: 500 mg | ORAL | Qty: 2

## 2016-07-11 MED FILL — CYMBALTA 30 MG CAPSULE,DELAYED RELEASE: 30 mg | ORAL | Qty: 1

## 2016-07-11 MED FILL — JANUVIA 50 MG TABLET: 50 mg | ORAL | Qty: 2

## 2016-07-11 MED FILL — OXYBUTYNIN CHLORIDE SR 5 MG 24 HR TAB: 5 mg | ORAL | Qty: 2

## 2016-07-11 MED FILL — PANTOPRAZOLE 40 MG TAB, DELAYED RELEASE: 40 mg | ORAL | Qty: 1

## 2016-07-11 MED FILL — ZOLPIDEM 5 MG TAB: 5 mg | ORAL | Qty: 2

## 2016-07-11 MED FILL — DILTIAZEM ER 120 MG 24 HR CAP: 120 mg | ORAL | Qty: 1

## 2016-07-11 NOTE — Progress Notes (Signed)
Problem: Mobility Impaired (Adult and Pediatric)  Goal: *Acute Goals and Plan of Care (Insert Text)  STG for 8.2.17 to be completed by 8.9.17 (7 days).  1. Pt will complete supine to/from sit with S in order to complete EOB activity.  2. Pt will complete sit to/from stand transfer with RW and S in prep for ambulation.  3. Pt will ambulate 100 feet with RW and min A in order to negotiate household distances.   PHYSICAL THERAPY TREATMENT     Patient: Cynthia Marsh (65 y.o. female)  Date: 07/11/2016  Diagnosis: Pneumonia <principal problem not specified>       Precautions:     Chart, physical therapy assessment, plan of care and goals were reviewed.      ASSESSMENT:  Pt found lying semi supine propped up on Left UE with LE hanging off bed. Pt reported high pain but showed willingness to participate in PT. Due to notation of pt weaning off of sup. O2, pt performed therex and gait mechanics without O2. Pt vitals checked after 30 ft of amb and sit to stand, (O2: 96%, HR: 99-102,). Pt able required minimial cuing for correct UE placement during sit to stand as well as cues during amb to keep minimum distance between RW and wall. Pt required no rest breaks and had no SOB during amb. Pt will continue to wean off O2 and use as needed. Pt able to achieve community amb distances but, still requires minimal VC for safety with sit to stand transfer. Pt would benefit for transfer to [facility] for continued education for house hold amb and safety.  Progression toward goals:        Improving appropriately and progressing toward goals        Improving slowly and progressing toward goals        Not making progress toward goals and plan of care will be adjusted       PLAN:  Patient continues to benefit from skilled intervention to address the above impairments.  Continue treatment per established plan of care.  Discharge Recommendations:  Home Health  Further Equipment Recommendations for Discharge:  N/A (pt has rollator           SUBJECTIVE:   Patient stated ???My back is hurting me, I'm in 10/10 pain. I can walk I have been doing good.???      OBJECTIVE DATA SUMMARY:   Critical Behavior:  Neurologic State: Alert  Orientation Level: Oriented X4  Cognition: Follows commands  Functional Mobility Training:  Bed Mobility:  Supine to Sit: Supervision  Sit to Supine: Supervision  Scooting: Supervision  Transfers:  Sit to Stand: Supervision  Stand to Sit: Supervision  Balance:  Sitting: Intact  Sitting - Static: Good (unsupported)  Sitting - Dynamic: Good (unsupported)  Standing: Impaired;With support  Standing - Static: Good;Other (comment) (with support of RW)  Standing - Dynamic : Fair  Ambulation/Gait Training:  Distance (ft): 240 Feet (ft)  Assistive Device: Walker, rolling  Ambulation - Level of Assistance: Stand-by asssistance;Contact guard assistance  Gait Abnormalities: Decreased step clearance  Base of Support: Narrowed  Speed/Cadence: Slow  Step Length: Left shortened;Right shortened   Therapeutic Exercises:   Seated EOB: H/R, T/R, marches, LAQ  Sets: 1  Reps: 20  Pain:  Pain Scale 1: Numeric (0 - 10)  Pain Intensity 1: 3  Pain Intervention(s) 1: Medication (see MAR)  Activity Tolerance:   Fair+  Please refer to the flowsheet for vital signs taken during  this treatment.  After treatment:   [ ]  Patient left in no apparent distress sitting up in chair  [X]  Patient left in no apparent distress in bed  [X]  Call bell left within reach  [ ]  Nursing notified  [ ]  Caregiver present  [ ]  Bed alarm activated      Julieanne MansonAustin H Nycere Presley   Time Calculation: 24 mins

## 2016-07-11 NOTE — Progress Notes (Signed)
Problem: Falls - Risk of  Goal: *Absence of falls  Outcome: Progressing Towards Goal  No falls to date while inpatient.

## 2016-07-11 NOTE — Progress Notes (Signed)
Problem: Pneumonia: Day 1  Goal: *Oxygen saturation within defined limits  Outcome: Progressing Towards Goal  Pt has been weaned off oxygen.  Will reassess throughout the night

## 2016-07-11 NOTE — Progress Notes (Signed)
Pulmonology Progress Note    Subjective:   -feeling well  -no pulmonary complaints today  -only complaint is acute on chronic back pain    Current Facility-Administered Medications   Medication Dose Route Frequency   ??? metFORMIN (GLUCOPHAGE) tablet 1,000 mg  1,000 mg Oral BID WITH MEALS   ??? SITagliptin (JANUVIA) tablet 100 mg  100 mg Oral DAILY   ??? budesonide (PULMICORT) 500 mcg/2 ml nebulizer suspension  500 mcg Nebulization BID RT   ??? albuterol (ACCUNEB) nebulizer solution 1.25 mg  1.25 mg Nebulization Q6H PRN   ??? dilTIAZem CD (CARDIZEM CD) capsule 120 mg  120 mg Oral DAILY   ??? tiotropium (SPIRIVA) inhalation capsule 18 mcg  1 Cap Inhalation DAILY   ??? piperacillin-tazobactam (ZOSYN) 3.375 g in 0.9% sodium chloride (MBP/ADV) 100 mL MBP  3.375 g IntraVENous Q6H   ??? azelastine (ASTELIN) 187mg/spray nasal spray  1 Spray Both Nostrils BID   ??? DULoxetine (CYMBALTA) capsule 30 mg  30 mg Oral DAILY   ??? hydrALAZINE (APRESOLINE) tablet 25 mg  25 mg Oral TID PRN   ??? oxybutynin chloride XL (DITROPAN XL) tablet 10 mg  10 mg Oral DAILY   ??? oxyCODONE-acetaminophen (PERCOCET 10) 10-325 mg per tablet 1 Tab  1 Tab Oral Q6H PRN   ??? pregabalin (LYRICA) capsule 75 mg  75 mg Oral TID   ??? rivaroxaban (XARELTO) tablet 20 mg  20 mg Oral DAILY   ??? zolpidem (AMBIEN) tablet 10 mg  10 mg Oral QHS PRN   ??? insulin lispro (HUMALOG) injection   SubCUTAneous AC&HS   ??? pantoprazole (PROTONIX) tablet 40 mg  40 mg Oral ACB       Review of Systems:  -per subjective    Objective:     Visit Vitals   ??? BP (!) 143/93 (BP 1 Location: Right arm, BP Patient Position: Sitting)   ??? Pulse 94   ??? Temp 97 ??F (36.1 ??C)   ??? Resp 16   ??? Ht '5\' 4"'$  (1.626 m)   ??? Wt 78 kg (172 lb)   ??? SpO2 99%   ??? Breastfeeding No   ??? BMI 29.52 kg/m2    O2 Flow Rate (L/min): 2 l/min O2 Device: Room air    08/05 1901 - 08/07 0700  In: 960 [P.O.:360; I.V.:600]  Out: -        Physical Exam:   Visit Vitals   ??? BP (!) 143/93 (BP 1 Location: Right arm, BP Patient Position: Sitting)    ??? Pulse 94   ??? Temp 97 ??F (36.1 ??C)   ??? Resp 16   ??? Ht '5\' 4"'$  (1.626 m)   ??? Wt 78 kg (172 lb)   ??? SpO2 99%   ??? Breastfeeding No   ??? BMI 29.52 kg/m2     General appearance: alert, cooperative, no distress, appears stated age  Head: Normocephalic, without obvious abnormality, atraumatic  Neck: supple, symmetrical, trachea midline, no adenopathy, no carotid bruit and no JVD  Lungs: clear to auscultation bilaterally  Heart: regular rate and rhythm, S1, S2 normal, no murmur, click, rub or gallop  Abdomen: soft, non-tender. Bowel sounds normal. No masses,  no organomegaly  Extremities: extremities normal, atraumatic, no cyanosis or edema  Pulses: 2+ and symmetric  Skin: Skin color, texture, turgor normal. No rashes or lesions  Neurologic: Grossly normal    Data Review:    Recent Results (from the past 24 hour(s))   GLUCOSE, POC    Collection Time: 07/10/16  4:06 PM   Result Value Ref Range    Glucose (POC) 90 70 - 110 mg/dL   GLUCOSE, POC    Collection Time: 07/10/16 10:13 PM   Result Value Ref Range    Glucose (POC) 115 (H) 70 - 110 mg/dL   CBC WITH AUTOMATED DIFF    Collection Time: 07/11/16  4:15 AM   Result Value Ref Range    WBC 5.9 4.6 - 13.2 K/uL    RBC 3.89 (L) 4.20 - 5.30 M/uL    HGB 10.1 (L) 12.0 - 16.0 g/dL    HCT 32.0 (L) 35.0 - 45.0 %    MCV 82.3 74.0 - 97.0 FL    MCH 26.0 24.0 - 34.0 PG    MCHC 31.6 31.0 - 37.0 g/dL    RDW 15.3 (H) 11.6 - 14.5 %    PLATELET 482 (H) 135 - 420 K/uL    MPV 9.2 9.2 - 11.8 FL    NEUTROPHILS 59 40 - 73 %    LYMPHOCYTES 24 21 - 52 %    MONOCYTES 14 (H) 3 - 10 %    EOSINOPHILS 3 0 - 5 %    BASOPHILS 0 0 - 2 %    ABS. NEUTROPHILS 3.5 1.8 - 8.0 K/UL    ABS. LYMPHOCYTES 1.4 0.9 - 3.6 K/UL    ABS. MONOCYTES 0.8 0.05 - 1.2 K/UL    ABS. EOSINOPHILS 0.2 0.0 - 0.4 K/UL    ABS. BASOPHILS 0.0 0.0 - 0.1 K/UL    DF AUTOMATED     MAGNESIUM    Collection Time: 07/11/16  4:15 AM   Result Value Ref Range    Magnesium 2.1 1.6 - 2.6 mg/dL   METABOLIC PANEL, COMPREHENSIVE     Collection Time: 07/11/16  4:15 AM   Result Value Ref Range    Sodium 142 136 - 145 mmol/L    Potassium 4.1 3.5 - 5.5 mmol/L    Chloride 106 100 - 108 mmol/L    CO2 30 21 - 32 mmol/L    Anion gap 6 3.0 - 18 mmol/L    Glucose 123 (H) 74 - 99 mg/dL    BUN 8 7.0 - 18 MG/DL    Creatinine 0.74 0.6 - 1.3 MG/DL    BUN/Creatinine ratio 11 (L) 12 - 20      GFR est AA >60 >60 ml/min/1.67m    GFR est non-AA >60 >60 ml/min/1.718m   Calcium 9.3 8.5 - 10.1 MG/DL    Bilirubin, total 0.1 (L) 0.2 - 1.0 MG/DL    ALT (SGPT) 44 13 - 56 U/L    AST (SGOT) 20 15 - 37 U/L    Alk. phosphatase 72 45 - 117 U/L    Protein, total 7.3 6.4 - 8.2 g/dL    Albumin 2.6 (L) 3.4 - 5.0 g/dL    Globulin 4.7 (H) 2.0 - 4.0 g/dL    A-G Ratio 0.6 (L) 0.8 - 1.7     GLUCOSE, POC    Collection Time: 07/11/16  7:24 AM   Result Value Ref Range    Glucose (POC) 103 70 - 110 mg/dL   GLUCOSE, POC    Collection Time: 07/11/16 12:20 PM   Result Value Ref Range    Glucose (POC) 105 70 - 110 mg/dL       Images: No new imaging today    Assessment:   1.  RML/RLL infiltrate c/w pneumonia, failing outpatient levaquin  2.  Asthma  3.  OSA  4.  SVT - avoiding LABA's  5.  PE in 2016 on Xarelto    Plan:   1. Complete 7 days of IV abx (last day tomorrow).  Anticipate DC after that  2. Continue Pulmicort and Spiriva, with SABA prn  3. Pulmonary hygiene including incentive spirometer and ambulation.    With anticipated discharge tomorrow, will sign off.  Should have close follow-up in the pulmonary clinic. Please call with questions.    Total time spent with patient: 25 minutes

## 2016-07-11 NOTE — Other (Signed)
Bedside shift change report given to Zadie, RN (oncoming nurse) by April, RN (offgoing nurse). Report included the following information SBAR, Kardex, ED Summary, Intake/Output, MAR, Accordion, Recent Results and Med Rec Status.

## 2016-07-11 NOTE — Progress Notes (Cosign Needed)
Progress Note    Patient: Cynthia Marsh MRN: [id number]  CSN: [id number]    Date of Birth: 10-24-1951  Age: 65 y.o.  Sex: female    DOA: 07/05/2016 LOS:  LOS: 6 days                    Subjective:       Pt continue to improve.  Pt  has OSA test waiting for c-pap machine.  Nasal cannula removed to attempt to wean off oxygen. Assessed pt's oxygen via pulse oxymetry pt read 96%.  Kept pt on room air, will reassess    8/6: Pt tolerating her diet . Pt had pt and ot evaluation with recommendation to go home with home health pt still on IV ABT.    Pt can be off 0 2 by NC in Am use O 2 at night until she get c-pap. CPK back to normal off cholesterol medication     Hospital course  65 y.o. female with h/o CKD, Asthma, HTN, PE 2016, and chronic back pain who presentedto ED after evaluation at our office yesterday  She has been  complaining of chest pain and SOB onset Sunday  Before admission  but worsened over the last 2 days before admission . Pt was seen at the ER twice last week end diagnosed with pneumonia and UTI  Pt started on Levaquin as outpatient without much response   ??  Pt has h/o chronic back pain   Pt has been f/u spine center she had MRI of the Thoracic and lumbar spine to rule out discitis/ epidural abscess. Has chronic Lumbar back pain and is on pain management. MRI 07/03/16 thoracic spine showed pneumonia   1. Evidence of pneumonia in the right greater than left lower lobe.  2. Lower thoracic paravertebral enhancement felt to be related to vessels and  abutting reactive changes. No acute thoracic spine findings. Follow-up CT  imaging with contrast can be performed to exclude acute infectious paraspinal process here.  ??   pt was c/o cough , fatigue at the office and has been feeling getting worse pt was sent back to ER WBC, CPK was elevated 1272 troponin I normal repeat CTA showed   Less than optimal contrast bolus. Proximal pulmonary arteries are well   opacified, more distal vessels not as well opacified limiting evaluation for  pulmonary embolus beyond the lobar branches.  ????  Within the limitations of the exam there is no finding of central pulmonary  embolus.  ????  Lobar consolidation in the posterior right lower lobe consistent with pneumonia.  Right middle lobe opacity that may be atelectasis or infection.   ????   ??  Pt was started on zosyn and vancomycin urine stret AG and legionella negative . Pt is feeling better today still has some soreness in the chest from coughing   ??  Pt has H/O chronic cough/ asthma   for 6 months had APPT with Dr Neale Burly before admission  but she couldn't make it    8/2 pt  had RRT for SVT with HR 180s, patient was asymptomatic with normal BP during the episode, started on diltiazem drip that initially helped but then resumed HR 170s, given adenosine 6mg  x1 after pre-treatment with ativan and HR has been 90-110s since without further SVT.  Cardiology consulted.      Chief Complaint:   Chief Complaint   Patient presents with   ??? Shortness of Breath   ??? Chest Pain  Review of systems  General: No fevers or chills.  Cardiovascular: No chest pain or pressure. No palpitations.   Pulmonary: No shortness of breath. No cough   Gastrointestinal: No abdominal pain, nausea, vomiting or diarrhea.   Genitourinary: No urinary frequency, urgency, hesitancy or dysuria.   Musculoskeletal: No joint or muscle pain, no back pain, no recent trauma.    Neurologic: No headache, numbness, tingling or weakness.     Objective:     Physical Exam:  Visit Vitals   ??? BP 126/87 (BP 1 Location: Right arm)   ??? Pulse 83   ??? Temp 97.2 ??F (36.2 ??C)   ??? Resp 16   ??? Ht 5\' 4"  (1.626 m)   ??? Wt 78 kg (172 lb)   ??? SpO2 96%   ??? Breastfeeding No   ??? BMI 29.52 kg/m2        General:         Alert, cooperative, no acute distress????  HEENT: NC, Atraumatic.  PERRLA, anicteric sclerae.  Lungs: Rales bilateral lung fields, most notably RLL.   Heart:  Regular  rhythm,?? No murmur, No Rubs, No Gallops, slightly tachycardic.  Abdomen: Soft, Non distended, Non tender. ??+Bowel sounds, no HSM  Extremities: Trace lower limb edema  Psych:???? Not anxious or agitated.  Neurologic:?? CN 2-12 grossly intact, Alert and oriented X 3.  No acute neurological                                 Deficits,     Intake and Output:  Current Shift:     Last three shifts:  08/05 1901 - 08/07 0700  In: 960 [P.O.:360; I.V.:600]  Out: -     Labs: Results:       Chemistry Recent Labs      07/11/16   0415  07/10/16   0322  07/09/16   0425   GLU  123*  137*  181*   NA  142  142  139   K  4.1  3.8  3.6   CL  106  108  105   CO2  30  29  26    BUN  8  9  11    CREA  0.74  0.56*  0.53*   CA  9.3  9.2  9.2   AGAP  6  5  8    BUCR  11*  16  21*   AP  72  71   --    TP  7.3  6.6   --    ALB  2.6*  2.4*   --    GLOB  4.7*  4.2*   --    AGRAT  0.6*  0.6*   --       CBC w/Diff Recent Labs      07/11/16   0415  07/10/16   0322   WBC  5.9  5.5   RBC  3.89*  3.79*   HGB  10.1*  9.8*   HCT  32.0*  31.4*   PLT  482*  411   GRANS  59  61   LYMPH  24  23   EOS  3  4      Cardiac Enzymes Recent Labs      07/10/16   0322   CPK  80      Coagulation No results for input(s): PTP, INR, APTT in the last 72 hours.  No lab exists for component: INREXT, INREXT    Lipid Panel Lab Results   Component Value Date/Time    Cholesterol, total 113 07/06/2016 02:36 AM    HDL Cholesterol 21 07/06/2016 02:36 AM    LDL, calculated 62.2 07/06/2016 02:36 AM    VLDL, calculated 29.8 07/06/2016 02:36 AM    Triglyceride 149 07/06/2016 02:36 AM    CHOL/HDL Ratio 5.4 07/06/2016 02:36 AM      BNP No results for input(s): BNPP in the last 72 hours.   Liver Enzymes Recent Labs      07/11/16   0415   TP  7.3   ALB  2.6*   AP  72   SGOT  20      Thyroid Studies Lab Results   Component Value Date/Time    TSH 1.62 07/07/2016 02:00 AM          Procedures/imaging: see electronic medical records for all  procedures/Xrays and details which were not copied into this note but were reviewed prior to creation of Plan    Prior to Admission Medications   Prescriptions Last Dose Informant Patient Reported? Taking?   ALPRAZolam (XANAX) 0.5 mg tablet   Yes No   Sig: TK 1 T PO BID.  MAXIMUM 2 TS D.   DULoxetine (CYMBALTA) 30 mg capsule   Yes No   Sig: Take 1 Cap by mouth daily.   FREESTYLE LANCETS 28 gauge misc   Yes No   Sig: CHECK BLOOD SUGAR BID   FREESTYLE LITE STRIPS strip   Yes No   Sig: USE TO CHECK BLOOD SUGAR BID   JANUMET 50-1,000 mg per tablet   Yes No   Sig: TK 1 T PO BID. STOP TAKING METFORMIN.   XARELTO 20 mg tab tablet   Yes No   Sig: Take 1 Tab by mouth daily.   amLODIPine (NORVASC) 10 mg tablet   Yes No   Sig: Take 1 Tab by mouth daily.   azelastine (ASTELIN) 137 mcg (0.1 %) nasal spray   Yes No   Sig: 1 Spray by Both Nostrils route two (2) times a day.   budesonide-formoterol (SYMBICORT) 80-4.5 mcg/actuation HFAA inhaler   Yes No   Sig: Take 2 Puffs by inhalation two (2) times a day.   busPIRone (BUSPAR) 15 mg tablet   Yes No   Sig: TK 1 T PO TID   cyclobenzaprine (FLEXERIL) 5 mg tablet   No No   Sig: Take 1 Tab by mouth nightly.   fluticasone (FLONASE) 50 mcg/actuation nasal spray   Yes No   Sig: 2 Sprays by Both Nostrils route nightly.   fluticasone (FLOVENT DISKUS) 100 mcg/actuation dsdv   Yes No   Sig: Take  by inhalation.   hydrALAZINE (APRESOLINE) 25 mg tablet   Yes No   Sig: Take 25 mg by mouth two (2) times a day.   hydroCHLOROthiazide (HYDRODIURIL) 25 mg tablet   Yes No   Sig: Take 1 Tab by mouth daily.   levoFLOXacin (LEVAQUIN) 750 mg tablet   No No   Sig: Take 1 Tab by mouth daily for 10 days.   meloxicam (MOBIC) 15 mg tablet   No No   Sig: Take 1 Tab by mouth daily.   mirtazapine (REMERON) 30 mg tablet   Yes No   Sig: Take 1 Tab by mouth daily.   montelukast (SINGULAIR) 10 mg tablet   Yes No   Sig: Take 1 Tab by mouth daily.  omeprazole (PRILOSEC) 20 mg capsule   Yes No    Sig: Take 1 Cap by mouth daily.   oxyCODONE-acetaminophen (PERCOCET 10) 10-325 mg per tablet   Yes No   Sig: Take  by mouth.   oxybutynin chloride XL (DITROPAN XL) 10 mg CR tablet   Yes No   Sig: Take 1 Tab by mouth daily.   pravastatin (PRAVACHOL) 40 mg tablet   Yes No   Sig: Take 1 Tab by mouth daily.   pregabalin (LYRICA) 75 mg capsule   No No   Sig: Take 1 Cap by mouth three (3) times daily. Max Daily Amount: 225 mg.   tiotropium bromide (SPIRIVA RESPIMAT) 1.25 mcg/actuation inhaler   Yes No   Sig: Take 2 Puffs by inhalation daily.   trimethoprim-polymyxin b (POLYTRIM) ophthalmic solution   No No   Sig: Administer 1 Drop to both eyes every four (4) hours.   zolpidem (AMBIEN) 10 mg tablet   Yes No   Sig: Take 1 Tab by mouth nightly.      Facility-Administered Medications: None      Medications:   Current Facility-Administered Medications   Medication Dose Route Frequency   ??? metFORMIN (GLUCOPHAGE) tablet 1,000 mg  1,000 mg Oral BID WITH MEALS   ??? SITagliptin (JANUVIA) tablet 100 mg  100 mg Oral DAILY   ??? budesonide (PULMICORT) 500 mcg/2 ml nebulizer suspension  500 mcg Nebulization BID RT   ??? albuterol (ACCUNEB) nebulizer solution 1.25 mg  1.25 mg Nebulization Q6H PRN   ??? dilTIAZem CD (CARDIZEM CD) capsule 120 mg  120 mg Oral DAILY   ??? tiotropium (SPIRIVA) inhalation capsule 18 mcg  1 Cap Inhalation DAILY   ??? piperacillin-tazobactam (ZOSYN) 3.375 g in 0.9% sodium chloride (MBP/ADV) 100 mL MBP  3.375 g IntraVENous Q6H   ??? azelastine (ASTELIN) 13mcg/spray nasal spray  1 Spray Both Nostrils BID   ??? DULoxetine (CYMBALTA) capsule 30 mg  30 mg Oral DAILY   ??? hydrALAZINE (APRESOLINE) tablet 25 mg  25 mg Oral TID PRN   ??? oxybutynin chloride XL (DITROPAN XL) tablet 10 mg  10 mg Oral DAILY   ??? oxyCODONE-acetaminophen (PERCOCET 10) 10-325 mg per tablet 1 Tab  1 Tab Oral Q6H PRN   ??? pregabalin (LYRICA) capsule 75 mg  75 mg Oral TID   ??? rivaroxaban (XARELTO) tablet 20 mg  20 mg Oral DAILY    ??? zolpidem (AMBIEN) tablet 10 mg  10 mg Oral QHS PRN   ??? insulin lispro (HUMALOG) injection   SubCUTAneous AC&HS   ??? pantoprazole (PROTONIX) tablet 40 mg  40 mg Oral ACB       Assessment/Plan     Active Problems:    Depression ()      Anxiety ()      Mild intermittent asthma without complication (04/20/2015)      Type 2 diabetes mellitus without complication (HCC) (04/20/2015)      Hx of spinal fusion (08/17/2015)      Dyslipidemia (04/27/2016)      Community acquired pneumonia (07/05/2016)      Rhabdomyolysis (07/05/2016)      Pneumonia (07/05/2016)      Plan:  ??   Community Acquired pneumonia failed outpatient treatment with PO levaquin  Continue Zosyn and vancomycin (8/2-  respiratory culture  No result and blood culture urine culture negative  Urine strept Ag and Legionella AG negative  Albuterol 1.25  nebulizer q 4h prn  F/u pulmonary consult  ??  SVT  Resolved, now sinus tachycardia  Echo EJF 60 % no wall motion abnormalities   Pt on oral Cardizem CD 120 mg daily     Asthma  Pulmicort q 12 h   Albuterol 1.25  nebulizer q 4 h prn  ??  ??  Rhabdomyolysis   Mild, Improved with hydration in the ER  Holding Pravachol  Recheck CPK  ??  Hypokalemia  Resolved with supplementation, monitor and repleate as needed  ??  UTI  Culture is negative  ??  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  Hypertension  Hydralazine prn for SBP above 160  Cardizem Cd 120 mg daily  ??  Hyperlipidemia  Hold Pravachol as above  ??  Diabetes Mellitus type 2  Sliding scale coverage  Check HG A1c 7%  Restart  Janumet continue to monitor with Humalog  ??  Continue oxygen at night  Need to schedule CPAP titration study  Continue Pt and ot evaluation and treatment  Continue to monitor lab cbc, cmp, mag   Discussed with Dr Mcarthur Rossettizuniga will keep IV ABT 1 more day       DVT/GI Prophylaxis: H2B/PPI and Xarelto and protonix      Lanier EnsignChris Dinardo  07/11/2016 10:34  AM

## 2016-07-11 NOTE — Other (Signed)
Bedside and Verbal shift change report given to Maude Leriche, RN (oncoming nurse) by Herbert Seta (offgoing nurse). Report included the following information SBAR, Kardex, MAR and Recent Results.    SITUATION:   ? Code Status: Full Code  ? Reason for Admission: Pneumonia    ? Hospital day: 6  ? Problem List:       Hospital Problems  Date Reviewed: 25-Jul-2016          Codes Class Noted POA    Community acquired pneumonia ICD-10-CM: J18.9  ICD-9-CM: 486  Jul 25, 2016 Yes        Rhabdomyolysis ICD-10-CM: M62.82  ICD-9-CM: 728.88  25-Jul-2016 Yes        Pneumonia ICD-10-CM: J18.9  ICD-9-CM: 486  07-25-16 Unknown        Dyslipidemia ICD-10-CM: E78.5  ICD-9-CM: 272.4  04/27/2016 Yes        Hx of spinal fusion (Chronic) ICD-10-CM: Z98.1  ICD-9-CM: V45.4  08/17/2015 Yes        Depression ICD-10-CM: F32.9  ICD-9-CM: 311  Unknown Yes        Anxiety ICD-10-CM: F41.9  ICD-9-CM: 300.00  Unknown Yes        Mild intermittent asthma without complication ICD-10-CM: J45.20  ICD-9-CM: 493.90  04/20/2015 Yes        Type 2 diabetes mellitus without complication (HCC) ICD-10-CM: E11.9  ICD-9-CM: 250.00  04/20/2015 Yes              BACKGROUND:    Past Medical History:   Past Medical History:   Diagnosis Date   ??? Anxiety    ??? Asthma    ??? 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 Cardiac echocardiogram 11/07/2015    EF 60%.  No RWMA.  Gr 1 DDfx.  RVSP 30 mmHg.     ??? Cardiovascular lower extremity venous duplex 11/06/2015    No DVT bilaterally.   ??? Chronic kidney disease    ??? Depression    ??? Developmental delay    ??? Diabetes (HCC)    ??? Diabetes mellitus (HCC)    ??? History of nuclear stress test 03/2016    No ischemia, no infarct, EF 67%.  Low risk study   ??? Hypertension    ??? Insomnia    ??? Osteoporosis    ??? Pneumonia 2010   ??? Thromboembolus [facility])          Patient taking anticoagulants yes     ASSESSMENT:   ? Changes in Assessment Throughout Shift: None    ? Patient has Central Line: no Reasons if yes: N/A   ? Patient has Foley Cath: no Reasons if yes: N/A     ? Last Vitals:     Vitals:    07/11/16 0926 07/11/16 1100 07/11/16 1222 07/11/16 1950   BP:   (!) 143/93 137/88   Pulse:  (!) 102 94 99   Resp:   16 18   Temp:   97 ??F (36.1 ??C) 98.7 ??F (37.1 ??C)   SpO2: 96% 95% 99% 95%   Weight:       Height:           ? IV and DRAINS (will only show if present)   Peripheral IV  Left Antecubital-Site Assessment: Clean, dry, & intact  [REMOVED] Peripheral IV 07/07/16 Left Arm-Site Assessment: Clean, dry, & intact  [REMOVED] Peripheral IV 07-25-2016 Right Hand-Site Assessment: Clean, dry, & intact    ? WOUND (if present)   Wound Type:  none   Dressing present Dressing Present : No   Wound Concerns/Notes:  none    ? PAIN    Pain Assessment    Pain Intensity 1: 7 (07/11/16 1330)    Pain Location 1: Back    Pain Intervention(s) 1: Medication (see MAR)    Patient Stated Pain Goal: 4  o Interventions for Pain:  Medication given, see MAR  o Intervention effective: yes  o Time of last intervention: 1223   o Reassessment Completed: yes     ? Last 3 Weights:  Last 3 Recorded Weights in this Encounter    07/09/16 0500 07/10/16 0454 07/11/16 0400   Weight: 78.7 kg (173 lb 9.6 oz) 82.5 kg (181 lb 14.4 oz) 78 kg (172 lb)     Weight change: -4.491 kg (-9 lb 14.4 oz)    ? INTAKE/OUPUT    Current Shift:      Last three shifts: 08/06 0701 - 08/07 1900  In: 360 [P.O.:360]  Out: -     ? LAB RESULTS     Recent Labs      07/11/16   0415  07/10/16   0322   WBC  5.9  5.5   HGB  10.1*  9.8*   HCT  32.0*  31.4*   PLT  482*  411        Recent Labs      07/11/16   0415  07/10/16   0322  07/09/16   0425   NA  142  142  139   K  4.1  3.8  3.6   GLU  123*  137*  181*   BUN  8  9  11    CREA  0.74  0.56*  0.53*   CA  9.3  9.2  9.2   MG  2.1  2.0   --        RECOMMENDATIONS AND DISCHARGE PLANNING     1. Pending tests/procedures/ Plan of Care or Other Needs:  Pt is being weaned off oxygen, currently at 97% on room air.       2. Discharge plan for patient and Needs/Barriers: Plan to discharge 07/12/2016, working with PT    3. Estimated Discharge Date: 07/12/2016 Posted on Whiteboard in Patient???s Room: yes      4. The patient's care plan was reviewed with the oncoming nurse.       "HEALS" SAFETY CHECK      Fall Risk    Total Score: 3    Safety Measures: Safety Measures: Call light within reach, Bed in low position    A safety check occurred in the patient's room between off going nurse and oncoming nurse listed above.    The safety check included the below items  Area Items   H  High Alert Medications ? Verify all high alert medication drips (heparin, PCA, etc.)   E  Equipment ? Suction is set up for ALL patients (with yanker)  ? Red plugs utilized for all equipment (IV pumps, etc.)  ? WOW???s wiped down at end of shift.  ? Room stocked with oxygen, suction, and other unit-specific supplies   A  Alarms ? Bed alarm is set for fall risk patients  ? Ensure chair alarm is in place and activated if patient is up in a chair   L  Lines ? Check IV for any infiltration  ? Foley bag is empty if patient has a Foley   ? Tubing and IV bags  are labeled   S  Safety   ? Room is clean, patient is clean, and equipment is clean.  ? Hallways are clear from equipment besides carts.   ? Fall bracelet on for fall risk patients  ? Ensure room is clear and free of clutter  ? Suction is set up for ALL patients (with yanker)  ? Hallways are clear from equipment besides carts.   ? Isolation precautions followed, supplies available outside room, sign posted     Herbert Seta

## 2016-07-11 NOTE — Progress Notes (Signed)
Occupational Therapy Note:   Orders received, chart reviewed and this patient was evaluated and discharged by OT 07/09/16 as she was independent with her self care routine. She was interviewed and she reports that nothing has changed and she declines another OT evaluation. Will sign off as per this patient's request. Dorna MaiLori Williams Critser,OTR/L

## 2016-07-11 NOTE — Progress Notes (Signed)
Progress Note    Patient: Cynthia Marsh MRN: [id number]  CSN: [id number]    Date of Birth: [birth year]  Age: 65 y.o.  Sex: female    DOA: 07/05/2016 LOS:  LOS: 6 days                    Subjective:     Pt continue to improve.  Pt  has OSA test waiting for c-pap machine.  Nasal cannula removed to attempt to wean off oxygen. Assessed pt's oxygen via pulse oxymetry pt read 96%.  Kept pt on room air, will reassess    8/6: Pt tolerating her diet . Pt had pt and ot evaluation with recommendation to go home with home health pt still on IV ABT.    Pt can be off 0 2 by NC in AM . Ptshould  use O 2 at night until she get c-pap. CPK back to normal off cholesterol medication    Discussed with case manger will get o2 by NC at home at night until she gets her c-pap titration study and C-pap machine     Hospital course  65 y.o. female with h/o CKD, Asthma, HTN, PE 2016, and chronic back pain who presentedto ED after evaluation at our office   She has been  complaining of chest pain and SOB onset Sunday  Before admission  but worsened over the last 2 days before admission . Pt was seen at the ER twice last week end diagnosed with pneumonia and UTI  Pt started on Levaquin as outpatient without much response   ??  Pt has h/o chronic back pain   Pt has been f/u spine center she had MRI of the Thoracic and lumbar spine to rule out discitis/ epidural abscess. Has chronic Lumbar back pain and is on pain management. MRI 07/03/16 thoracic spine showed pneumonia   1. Evidence of pneumonia in the right greater than left lower lobe.  2. Lower thoracic paravertebral enhancement felt to be related to vessels and  abutting reactive changes. No acute thoracic spine findings. Follow-up CT  imaging with contrast can be performed to exclude acute infectious paraspinal process here.  ??   pt was c/o cough , fatigue at the office and has been feeling getting worse pt was sent back to ER WBC, CPK was elevated 1272 troponin I normal repeat CTA showed    Less than optimal contrast bolus. Proximal pulmonary arteries are well  opacified, more distal vessels not as well opacified limiting evaluation for  pulmonary embolus beyond the lobar branches.  ????  Within the limitations of the exam there is no finding of central pulmonary  embolus.  ????  Lobar consolidation in the posterior right lower lobe consistent with pneumonia.  Right middle lobe opacity that may be atelectasis or infection.   ????   ??  Pt was started on zosyn and vancomycin urine strept AG and legionella negative . Pt continue to improve on IV ABT   ??  Pt has H/O chronic cough/ asthma   for 6 months had APPT with Dr Neale Burly before admission  but she couldn't make it    8/2 pt  had RRT for SVT with HR 180s, patient was asymptomatic with normal BP during the episode, started on diltiazem drip that initially helped but then resumed HR 170s, given adenosine 6mg  x1 after pre-treatment with ativan and HR has been 90-110s since without further SVT.  Cardiology consulted.    Pt has been on  sinus Rhythm no chest pain no palpation on Cardizem CD 120    Discussed with Dr Mcarthur Rossetti pulmonary pt will finish course of ABT IV since she was not reponding to oral Levaquin as outpatient      Chief Complaint:   Chief Complaint   Patient presents with   ??? Shortness of Breath   ??? Chest Pain       Review of systems  General: No fevers or chills.  Cardiovascular: No chest pain or pressure. No palpitations.   Pulmonary: No shortness of breath. No cough   Gastrointestinal: No abdominal pain, nausea, vomiting or diarrhea.   Genitourinary: No urinary frequency, urgency, hesitancy or dysuria.   Musculoskeletal: No joint or muscle pain, no back pain, no recent trauma.    Neurologic: No headache, numbness, tingling or weakness.     Objective:     Physical Exam:  Visit Vitals   ??? BP 126/87 (BP 1 Location: Right arm)   ??? Pulse (!) 102   ??? Temp 97.2 ??F (36.2 ??C)   ??? Resp 16   ??? Ht 5\' 4"  (1.626 m)   ??? Wt 78 kg (172 lb)   ??? SpO2 95%    ??? Breastfeeding No   ??? BMI 29.52 kg/m2        General:         Alert, cooperative, no acute distress????  HEENT: NC, Atraumatic.  PERRLA, anicteric sclerae.  Lungs: Rales bilateral lung fields, most notably RLL.  Heart:  Regular  rhythm,?? No murmur, No Rubs, No Gallops, slightly tachycardic.  Abdomen: Soft, Non distended, Non tender. ??+Bowel sounds, no HSM  Extremities: Trace lower limb edema  Psych:???? Not anxious or agitated.  Neurologic:?? CN 2-12 grossly intact, Alert and oriented X 3.  No acute neurological                                 Deficits,     Intake and Output:  Current Shift:     Last three shifts:  08/05 1901 - 08/07 0700  In: 960 [P.O.:360; I.V.:600]  Out: -     Labs: Results:       Chemistry Recent Labs      07/11/16   0415  07/10/16   0322  07/09/16   0425   GLU  123*  137*  181*   NA  142  142  139   K  4.1  3.8  3.6   CL  106  108  105   CO2  30  29  26    BUN  8  9  11    CREA  0.74  0.56*  0.53*   CA  9.3  9.2  9.2   AGAP  6  5  8    BUCR  11*  16  21*   AP  72  71   --    TP  7.3  6.6   --    ALB  2.6*  2.4*   --    GLOB  4.7*  4.2*   --    AGRAT  0.6*  0.6*   --       CBC w/Diff Recent Labs      07/11/16   0415  07/10/16   0322   WBC  5.9  5.5   RBC  3.89*  3.79*   HGB  10.1*  9.8*   HCT  32.0*  31.4*  PLT  482*  411   GRANS  59  61   LYMPH  24  23   EOS  3  4      Cardiac Enzymes Recent Labs      07/10/16   0322   CPK  80      Coagulation No results for input(s): PTP, INR, APTT in the last 72 hours.    No lab exists for component: INREXT, INREXT    Lipid Panel Lab Results   Component Value Date/Time    Cholesterol, total 113 07/06/2016 02:36 AM    HDL Cholesterol 21 07/06/2016 02:36 AM    LDL, calculated 62.2 07/06/2016 02:36 AM    VLDL, calculated 29.8 07/06/2016 02:36 AM    Triglyceride 149 07/06/2016 02:36 AM    CHOL/HDL Ratio 5.4 07/06/2016 02:36 AM      BNP No results for input(s): BNPP in the last 72 hours.   Liver Enzymes Recent Labs      07/11/16   0415   TP  7.3   ALB  2.6*   AP  72    SGOT  20      Thyroid Studies Lab Results   Component Value Date/Time    TSH 1.62 07/07/2016 02:00 AM          Procedures/imaging: see electronic medical records for all procedures/Xrays and details which were not copied into this note but were reviewed prior to creation of Plan    Prior to Admission Medications   Prescriptions Last Dose Informant Patient Reported? Taking?   ALPRAZolam (XANAX) 0.5 mg tablet   Yes No   Sig: TK 1 T PO BID.  MAXIMUM 2 TS D.   DULoxetine (CYMBALTA) 30 mg capsule   Yes No   Sig: Take 1 Cap by mouth daily.   FREESTYLE LANCETS 28 gauge misc   Yes No   Sig: CHECK BLOOD SUGAR BID   FREESTYLE LITE STRIPS strip   Yes No   Sig: USE TO CHECK BLOOD SUGAR BID   JANUMET 50-1,000 mg per tablet   Yes No   Sig: TK 1 T PO BID. STOP TAKING METFORMIN.   XARELTO 20 mg tab tablet   Yes No   Sig: Take 1 Tab by mouth daily.   amLODIPine (NORVASC) 10 mg tablet   Yes No   Sig: Take 1 Tab by mouth daily.   azelastine (ASTELIN) 137 mcg (0.1 %) nasal spray   Yes No   Sig: 1 Spray by Both Nostrils route two (2) times a day.   budesonide-formoterol (SYMBICORT) 80-4.5 mcg/actuation HFAA inhaler   Yes No   Sig: Take 2 Puffs by inhalation two (2) times a day.   busPIRone (BUSPAR) 15 mg tablet   Yes No   Sig: TK 1 T PO TID   cyclobenzaprine (FLEXERIL) 5 mg tablet   No No   Sig: Take 1 Tab by mouth nightly.   fluticasone (FLONASE) 50 mcg/actuation nasal spray   Yes No   Sig: 2 Sprays by Both Nostrils route nightly.   fluticasone (FLOVENT DISKUS) 100 mcg/actuation dsdv   Yes No   Sig: Take  by inhalation.   hydrALAZINE (APRESOLINE) 25 mg tablet   Yes No   Sig: Take 25 mg by mouth two (2) times a day.   hydroCHLOROthiazide (HYDRODIURIL) 25 mg tablet   Yes No   Sig: Take 1 Tab by mouth daily.   levoFLOXacin (LEVAQUIN) 750 mg tablet   No No   Sig:  Take 1 Tab by mouth daily for 10 days.   meloxicam (MOBIC) 15 mg tablet   No No   Sig: Take 1 Tab by mouth daily.   mirtazapine (REMERON) 30 mg tablet   Yes No    Sig: Take 1 Tab by mouth daily.   montelukast (SINGULAIR) 10 mg tablet   Yes No   Sig: Take 1 Tab by mouth daily.   omeprazole (PRILOSEC) 20 mg capsule   Yes No   Sig: Take 1 Cap by mouth daily.   oxyCODONE-acetaminophen (PERCOCET 10) 10-325 mg per tablet   Yes No   Sig: Take  by mouth.   oxybutynin chloride XL (DITROPAN XL) 10 mg CR tablet   Yes No   Sig: Take 1 Tab by mouth daily.   pravastatin (PRAVACHOL) 40 mg tablet   Yes No   Sig: Take 1 Tab by mouth daily.   pregabalin (LYRICA) 75 mg capsule   No No   Sig: Take 1 Cap by mouth three (3) times daily. Max Daily Amount: 225 mg.   tiotropium bromide (SPIRIVA RESPIMAT) 1.25 mcg/actuation inhaler   Yes No   Sig: Take 2 Puffs by inhalation daily.   trimethoprim-polymyxin b (POLYTRIM) ophthalmic solution   No No   Sig: Administer 1 Drop to both eyes every four (4) hours.   zolpidem (AMBIEN) 10 mg tablet   Yes No   Sig: Take 1 Tab by mouth nightly.      Facility-Administered Medications: None      Medications:   Current Facility-Administered Medications   Medication Dose Route Frequency   ??? metFORMIN (GLUCOPHAGE) tablet 1,000 mg  1,000 mg Oral BID WITH MEALS   ??? SITagliptin (JANUVIA) tablet 100 mg  100 mg Oral DAILY   ??? budesonide (PULMICORT) 500 mcg/2 ml nebulizer suspension  500 mcg Nebulization BID RT   ??? albuterol (ACCUNEB) nebulizer solution 1.25 mg  1.25 mg Nebulization Q6H PRN   ??? dilTIAZem CD (CARDIZEM CD) capsule 120 mg  120 mg Oral DAILY   ??? tiotropium (SPIRIVA) inhalation capsule 18 mcg  1 Cap Inhalation DAILY   ??? piperacillin-tazobactam (ZOSYN) 3.375 g in 0.9% sodium chloride (MBP/ADV) 100 mL MBP  3.375 g IntraVENous Q6H   ??? azelastine (ASTELIN) 16037mcg/spray nasal spray  1 Spray Both Nostrils BID   ??? DULoxetine (CYMBALTA) capsule 30 mg  30 mg Oral DAILY   ??? hydrALAZINE (APRESOLINE) tablet 25 mg  25 mg Oral TID PRN   ??? oxybutynin chloride XL (DITROPAN XL) tablet 10 mg  10 mg Oral DAILY    ??? oxyCODONE-acetaminophen (PERCOCET 10) 10-325 mg per tablet 1 Tab  1 Tab Oral Q6H PRN   ??? pregabalin (LYRICA) capsule 75 mg  75 mg Oral TID   ??? rivaroxaban (XARELTO) tablet 20 mg  20 mg Oral DAILY   ??? zolpidem (AMBIEN) tablet 10 mg  10 mg Oral QHS PRN   ??? insulin lispro (HUMALOG) injection   SubCUTAneous AC&HS   ??? pantoprazole (PROTONIX) tablet 40 mg  40 mg Oral ACB       Assessment/Plan     Active Problems:    Depression ()      Anxiety ()      Mild intermittent asthma without complication (04/20/2015)      Type 2 diabetes mellitus without complication (HCC) (04/20/2015)      Hx of spinal fusion (08/17/2015)      Dyslipidemia (04/27/2016)      Community acquired pneumonia (07/05/2016)  Rhabdomyolysis (07/05/2016)      Pneumonia (07/05/2016)      Plan:  ??   Community Acquired pneumonia failed outpatient treatment with PO levaquin  Continue Zosyn and vancomycin x 7days  respiratory culture  No result and blood culture urine culture negative  Urine strept Ag and Legionella AG negative  Albuterol 1.25  nebulizer q 4h prn  F/u pulmonary consult  ??  SVT  Resolved, now sinus tachycardia  Echo EJF 60 % no wall motion abnormalities   Pt on oral Cardizem CD 120 mg daily  D/C norvasc     Asthma  Pulmicort q 12 h   Albuterol 1.25  nebulizer q 4 h prn  ??  ??  Rhabdomyolysis   Mild, Improved with hydration in the ER  Holding Pravachol  Recheck CPK  ??  Hypokalemia  Resolved with supplementation, monitor and repleate as needed  ??  UTI  Culture is negative  ??  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  Hypertension  Hydralazine prn for SBP above 160  Cardizem Cd 120 mg daily  ??  Hyperlipidemia  Hold Pravachol as above  ??  Diabetes Mellitus type 2  Sliding scale coverage  Check HG A1c 7%  Restart  Janumet continue to monitor with Humalog  ??  Continue oxygen at night  Need to schedule CPAP titration study  Continue Pt and ot evaluation and treatment  Cbc, cmp, mag in AM       DVT/GI Prophylaxis: H2B/PPI and Xarelto and protonix      Lenn Sink, MD  07/11/2016 11:30  AM

## 2016-07-11 NOTE — Progress Notes (Signed)
NUTRITION    Nutrition Screen      RECOMMENDATIONS / PLAN:     - No nutrition intervention indicated at this time will continue to monitor.   - Continue RD inpatient monitoring and evaluation.     NUTRITION INTERVENTIONS & DIAGNOSIS:      Meals/Snacks: modified diet   Medical food supplementation     Nutrition Diagnosis: No nutrition diagnosis at this time.       ASSESSMENT:     Pt reports good appetite with no recent unintentional weight loss.     Average po intake adequate to meet patients estimated nutritional needs:    Yes      No    Unable to determine at this time    Diet: DIET DIABETIC CONSISTENT CARB Regular      Food Allergies: almonds  Current Appetite:    Good      Fair      Poor      Other:  Appetite/meal intake prior to admission:    Good      Fair      Poor      Other:  Feeding Limitations:   Swallowing difficulty     Chewing difficulty     Other:  Current Meal Intake: Patient Vitals for the past 100 hrs:   % Diet Eaten   07/10/16 0955 100 %   07/09/16 1801 75 %   07/09/16 1423 60 %   07/09/16 0923 70 %   07/08/16 0909 50 %       BM:  8/7 per pt  Skin Integrity: no pressure injury noted  Edema: none noted  Pertinent Medications: Reviewed    Recent Labs      07/11/16   0415  07/10/16   0322  07/09/16   0425   NA  142  142  139   K  4.1  3.8  3.6   CL  106  108  105   CO2  _0 GLU  123*  137*  181*   BUN  _1 CREA  0.74  0.56*  0.53*   CA  9.3  9.2  9.2   MG  2.1  2.0   --    ALB  2.6*  2.4*   --    SGOT  20  30   --    ALT  44  56   --      No intake or output data in the 24 hours ending 07/11/16 1135    Anthropometrics:  Ht Readings from Last 1 Encounters:   07/06/16 _2  (1.626 m)     Last 3 Recorded Weights in this Encounter    07/09/16 0500 07/10/16 0454 07/11/16 0400   Weight: 78.7 kg (173 lb 9.6 oz) 82.5 kg (181 lb 14.4 oz) 78 kg (172 lb)     Body mass index is 29.52 kg/(m^2).          Weight History:  Pt states her weight has been variable but she has been  trying to lose weight PTA by watching her diet.    Weight Metrics 07/11/2016 07/02/2016 06/01/2016 05/10/2016 05/06/2016 05/06/2016 04/27/2016   Weight 172 lb 173 lb 180 lb 6.4 oz 180 lb 178 lb 178 lb 183 lb   BMI 29.52 kg/m2 29.7 kg/m2 30.97 kg/m2 30.9 kg/m2 30.55 kg/m2 30.55 kg/m2 31.41 kg/m2   Some encounter information is confidential and restricted. Go  to Review Flowsheets activity to see all data.        Admitting Diagnosis: Pneumonia  Pertinent PMHx: DM, HTN    Education Needs:         None identified   Identified - Not appropriate at this time    Identified and addressed - refer to education log  Learning Limitations:    None identified   Identified    Cultural, religious & ethnic food preferences:   None identified     Identified and addressed     ESTIMATED NUTRITION NEEDS:     Calories: 1578-1710 kcal (MSJx1.2-1.3) based on   Actual BW       IBW   Protein: 62-78 gm (0.8-1 gm/kg) based on   Actual BW       IBW   Fluid: 1 mL/kcal     MONITORING & EVALUATION:     Nutrition Goal(s):   1. Po intake of meals will meet >75% of patient estimated nutritional needs within the next 7 days.  Outcome:   Met/Ongoing      Not Met     New/Initial Goal     Monitoring:    Diet tolerance    Meal intake    Supplement intake    GI symptoms/ability to tolerate po diet    Respiratory status    Plan of care      Previous Recommendations (for follow-up assessments only):        Implemented          Not Implemented (RD to address)      No Recommendation Made     Discharge Planning: diabetic diet   Participated in care planning, discharge planning, & interdisciplinary rounds as appropriate      Nila Nephew, [place]   Pager: [phone]

## 2016-07-11 NOTE — Progress Notes (Addendum)
SW spoke with Jasmine DecemberSharon from [facility] regarding CPAP machine.  Per Jasmine DecemberSharon patient was not entered into their system over the weekend when contacted by CM.  Paperwork provided to [employer] Inc[employer]aron.    1145: Sharon from [facility] requesting amended order.  SW spoke with NP Dellyn who stated she will update the order and speak with physician to co-sign per DME requirements.

## 2016-07-11 NOTE — Other (Addendum)
0900:  Removed nasal cannula to attempt to wean pt from Oxygen.    0920:  Assessed pt's oxygen via pulse oxymetry pt read 96%.  Kept pt on room air, will reassess.    1100 : Assessed pt's oxygen via pulse ox while pt ambulating with PT on room air.  Pt's pulse 102, oxygen 95%.

## 2016-07-11 NOTE — Other (Signed)
1900 Assumed care of patient. Pt alert and oriented x 4. VSS. PIV CDI. No maintenance fluids noted. Pt in no distress RA. No complaints of pain at this time. Bed in low locked position and call bell within reach.     2000  Shift assessment completed. Pt medicated for pain with Percocet PO.    2100 Hourly rounding. Evening meds given.     2200 Blood sugar drawn, WNL. No coverage necessary. Pt requesting Ambien to help with sleep. Ambien given per MAR.    0000- 0600 Hourly rounding. Pt reassessed, resting quietly with no c/o of pain at this time    0700 Bedside shift change report given to Jerold CoombeZadie RN (oncoming nurse) by Tanda RockersAshleigh W., RN     (offgoing nurse). Report included the following information SBAR, Kardex, Intake/Output, MAR and Recent Results.

## 2016-07-12 LAB — CBC WITH AUTOMATED DIFF
ABS. BASOPHILS: 0 10*3/uL (ref 0.0–0.06)
ABS. EOSINOPHILS: 0.3 10*3/uL (ref 0.0–0.4)
ABS. LYMPHOCYTES: 1.7 10*3/uL (ref 0.9–3.6)
ABS. MONOCYTES: 0.8 10*3/uL (ref 0.05–1.2)
ABS. NEUTROPHILS: 3.6 10*3/uL (ref 1.8–8.0)
BASOPHILS: 0 % (ref 0–2)
EOSINOPHILS: 4 % (ref 0–5)
HCT: 31 % — ABNORMAL LOW (ref 35.0–45.0)
HGB: 9.8 g/dL — ABNORMAL LOW (ref 12.0–16.0)
LYMPHOCYTES: 27 % (ref 21–52)
MCH: 25.9 PG (ref 24.0–34.0)
MCHC: 31.6 g/dL (ref 31.0–37.0)
MCV: 82 FL (ref 74.0–97.0)
MONOCYTES: 13 % — ABNORMAL HIGH (ref 3–10)
MPV: 9 FL — ABNORMAL LOW (ref 9.2–11.8)
NEUTROPHILS: 56 % (ref 40–73)
PLATELET: 469 10*3/uL — ABNORMAL HIGH (ref 135–420)
RBC: 3.78 M/uL — ABNORMAL LOW (ref 4.20–5.30)
RDW: 15.1 % — ABNORMAL HIGH (ref 11.6–14.5)
WBC: 6.4 10*3/uL (ref 4.6–13.2)

## 2016-07-12 LAB — GLUCOSE, POC
Glucose (POC): 115 mg/dL — ABNORMAL HIGH (ref 70–110)
Glucose (POC): 102 mg/dL (ref 70–110)
Glucose (POC): 129 mg/dL — ABNORMAL HIGH (ref 70–110)

## 2016-07-12 LAB — MAGNESIUM: Magnesium: 1.9 mg/dL (ref 1.6–2.6)

## 2016-07-12 LAB — METABOLIC PANEL, COMPREHENSIVE
A-G Ratio: 0.6 — ABNORMAL LOW (ref 0.8–1.7)
ALT (SGPT): 37 U/L (ref 13–56)
AST (SGOT): 14 U/L — ABNORMAL LOW (ref 15–37)
Albumin: 2.6 g/dL — ABNORMAL LOW (ref 3.4–5.0)
Alk. phosphatase: 70 U/L (ref 45–117)
Anion gap: 5 mmol/L (ref 3.0–18)
BUN/Creatinine ratio: 12 (ref 12–20)
BUN: 9 MG/DL (ref 7.0–18)
Bilirubin, total: 0.3 MG/DL (ref 0.2–1.0)
CO2: 29 mmol/L (ref 21–32)
Calcium: 9.5 MG/DL (ref 8.5–10.1)
Chloride: 107 mmol/L (ref 100–108)
Creatinine: 0.78 MG/DL (ref 0.6–1.3)
GFR est AA: >60 mL/min/{1.73_m2} (ref >60)
GFR est non-AA: >60 mL/min/{1.73_m2} (ref >60)
Globulin: 4.4 g/dL — ABNORMAL HIGH (ref 2.0–4.0)
Glucose: 108 mg/dL — ABNORMAL HIGH (ref 74–99)
Potassium: 3.6 mmol/L (ref 3.5–5.5)
Protein, total: 7 g/dL (ref 6.4–8.2)
Sodium: 141 mmol/L (ref 136–145)

## 2016-07-12 MED ORDER — HYDROCHLOROTHIAZIDE 25 MG TAB
25 mg | Freq: Every day | ORAL | Status: DC
Start: 2016-07-12 — End: 2016-07-12
  Administered 2016-07-12: 13:00:00 via ORAL

## 2016-07-12 MED ORDER — BUDESONIDE 0.5 MG/2 ML NEB SUSPENSION
0.5 mg/2 mL | Freq: Two times a day (BID) | RESPIRATORY_TRACT | 0 refills | Status: AC
Start: 2016-07-12 — End: 2016-08-11

## 2016-07-12 MED ORDER — DILTIAZEM ER 120 MG 24 HR CAP
120 mg | ORAL_CAPSULE | Freq: Every day | ORAL | 0 refills | Status: AC
Start: 2016-07-12 — End: 2016-08-11

## 2016-07-12 MED FILL — LYRICA 75 MG CAPSULE: 75 mg | ORAL | Qty: 1

## 2016-07-12 MED FILL — PANTOPRAZOLE 40 MG TAB, DELAYED RELEASE: 40 mg | ORAL | Qty: 1

## 2016-07-12 MED FILL — PIPERACILLIN-TAZOBACTAM 3.375 GRAM IV SOLR: 3.375 gram | INTRAVENOUS | Qty: 3.38

## 2016-07-12 MED FILL — DILTIAZEM ER 120 MG 24 HR CAP: 120 mg | ORAL | Qty: 1

## 2016-07-12 MED FILL — OXYCODONE-ACETAMINOPHEN 10 MG-325 MG TAB: 10-325 mg | ORAL | Qty: 1

## 2016-07-12 MED FILL — BUDESONIDE 0.5 MG/2 ML NEB SUSPENSION: 0.5 mg/2 mL | RESPIRATORY_TRACT | Qty: 1

## 2016-07-12 MED FILL — JANUVIA 50 MG TABLET: 50 mg | ORAL | Qty: 2

## 2016-07-12 MED FILL — OXYBUTYNIN CHLORIDE SR 5 MG 24 HR TAB: 5 mg | ORAL | Qty: 2

## 2016-07-12 MED FILL — METFORMIN 500 MG TAB: 500 mg | ORAL | Qty: 2

## 2016-07-12 MED FILL — ZOLPIDEM 5 MG TAB: 5 mg | ORAL | Qty: 2

## 2016-07-12 MED FILL — XARELTO 20 MG TABLET: 20 mg | ORAL | Qty: 1

## 2016-07-12 MED FILL — CYMBALTA 30 MG CAPSULE,DELAYED RELEASE: 30 mg | ORAL | Qty: 1

## 2016-07-12 MED FILL — HYDROCHLOROTHIAZIDE 25 MG TAB: 25 mg | ORAL | Qty: 1

## 2016-07-12 NOTE — Discharge Summary (Signed)
Discharge Summary        Patient ID:        Cynthia Marsh        [id number]        65 y.o.        [birth year]        Admission Date: 07/05/2016      Discharge date and time: 07/12/2016        Inpatient care:   Problem that led to hospitalization: Active Problems:    Pneumonia (07/05/2016)      Depression ()      Anxiety ()      Mild intermittent asthma without complication (04/20/2015)      Type 2 diabetes mellitus without complication (HCC) (04/20/2015)      Hx of spinal fusion (08/17/2015)      Dyslipidemia (04/27/2016)      Community acquired pneumonia (07/05/2016)      Rhabdomyolysis (07/05/2016)      HPI:  65 y.o. female with h/o CKD, Asthma, HTN, PE 2016, and chronic back pain who presented to ED after evaluation at our office  ??She has been ??complaining of chest pain and SOB onset Sunday  Before admission  but worsened over the last 2 days before admission . Pt was seen at the ER twice last week end diagnosed with pneumonia and UTI  Pt started on Levaquin as outpatient without much response   ????  Pt has h/o chronic back pain ????Pt has been f/u spine center she had MRI of the Thoracic and lumbar spine to rule out discitis/ epidural abscess. Has chronic Lumbar back pain and is on pain management. MRI 07/03/16 thoracic spine showed pneumonia   1. Evidence of pneumonia in the right greater than left lower lobe.  2. Lower thoracic paravertebral enhancement felt to be related to vessels and  abutting reactive changes. No acute thoracic spine findings. Follow-up CT  imaging with contrast can be performed to exclude acute infectious paraspinal process here.  ????  ??pt was c/o cough , fatigue at the office and has been feeling getting worse pt was sent back to ER WBC, CPK was elevated 1272 troponin I normal repeat CTA showed   Less than optimal contrast bolus. Proximal pulmonary arteries are well  opacified, more distal vessels not as well opacified limiting evaluation for  pulmonary embolus beyond the lobar branches.  ????  Within the  limitations of the exam there is no finding of central pulmonary  embolus.  ????  Lobar consolidation in the posterior right lower lobe consistent with pneumonia.  Right middle lobe opacity that may be atelectasis or infection.   ????   Hospital course:  Pt was started on zosyn and vancomycin urine strept AG and legionella negative . Pt continue to improve on IV ABT.  Pulmonology consulted, Dr Mcarthur Rossetti, and recommended continue 7 days IV antibiotic therapy due to outpatient failure of po levaquin.  ????  Pt has H/O chronic cough/ asthma  ??for 6 months had APPT with Dr Neale Burly before admission  but she couldn't make it.  Seen by Pulmonology, due to episode of SVT switched from symbicort to pulmicort and spiriva.  She should continue this and follow up with pulmonology as directed.  ??  8/2 pt  had RRT for SVT with HR 180s, patient was asymptomatic with normal BP during the episode, started on diltiazem drip that initially helped but then resumed HR 170s, given adenosine 6mg  x1 after pre-treatment with ativan and HR has been 90-110s  since without further SVT.  Cardiology consulted.  Transitioned to Cardizem CD 120mg  daily and should continue this until followed up with her cardiologist (Dr. Jeneen MontgomeryMarc Rosenburg).  Has remained in sinus rhythm since this event.     Hypertension.  Patient's norvasc was discontinued as she was started on cardizem.  Restarted HCTZ on day of discharge as blood pressure was starting to increse.  Continue to hold hydralazine until follow up with PCM to determine need to start.    Mild Rhabdo, likely from acute illness.  Statin held, restarted upon discharge, follow up with PCM to monitor LFTs.    Suspected OSA.  Overnight had desats, required 2L Oxygen via NC while sleeping.  Home oxygen arranged while patient awaiting sleep study and CPAP.      Some weakness noted by PT, recommended home health PT upon discharge.  ????  Result Information    ?? Status Provider Status      ?? Final result (Exam End: 07/05/2016  ??7:24 PM) Open    ??   Study Result    ?? CT Chest Pulmonary  ??  CPT CODE: [id number]  ??  HISTORY: Chest pain and shortness of breath. History of PE.  ??  COMPARISON: Chest 2 views 07/05/2016. CT chest pulmonary 05/16/2016.  ??  TECHNIQUE: 2.5 millimeter contiguous axial images from the lung apices to the  level of the adrenal glands following timed contrast bolus for maximum  intravascular enhancement of the pulmonary arteries.  Images reviewed in soft  tissue and lung windows. Coronal and sagittal MIP reformations were performed  for better evaluation of tortuous branching pulmonary vessels. Isovue-370 100 mL  IV contrast.  ??  FINDINGS:   ??  Suboptimal contrast bolus timing. The main pulmonary artery and lobar branches  are fairly well opacified, but the segmental and distal branches are not as well  opacified with contrast. Within this limitation there is no definite finding of  an acute pulmonary embolus. No focal filling defect within the main pulmonary  arteries or lobar branches. Some heterogeneous contrast density within the right  upper lobe and right middle lobe segmental branches, but not occlusive.  ??  Aorta is moderately well opacified, appears normal.  ??  Small bilateral pleural effusions. Lobar consolidation in the posterior right  lower lobe. Some subsegmental opacification in the right middle lobe. Some  linear opacities in the lingula and posterior left lower lobe likely  subsegmental atelectasis.  ??  The heart is enlarged, similar to the prior exam. No pericardial effusion.  ??  Small hiatal hernia. Visible portions of the liver and spleen appear normal.  Adrenals are not enlarged. Visible portions of the pancreas and kidneys  unremarkable.  ??  IMPRESSION  IMPRESSION:  ??  Less than optimal contrast bolus. Proximal pulmonary arteries are well  opacified, more distal vessels not as well opacified limiting evaluation for  pulmonary embolus beyond the lobar branches.  ??  Within the limitations of the exam there is  no finding of central pulmonary  embolus.  ??  Lobar consolidation in the posterior right lower lobe consistent with pneumonia.  Right middle lobe opacity that may be atelectasis or infection.     07/06/2016 ??4:16 PM - Edi, Card Result In   ??   Narrative    ?? [facility]  [address]  [place], [place] [id number]  [phone]    Transthoracic Echocardiogram    Patient: Cynthia Marsh  MRN: [id number]  ACCT #: [id number]  DOB: Aug 10, 1951  Age: 22 years  Gender: Female  Height: 64 in  Weight: 180.6 lb  BSA: 1.88 m-sq  BP: 101 / 71 mmHg  Study date: 06-Jul-2016  Status: Routine  Location: [facility]  DMS ACC #: [id number]    Allergies: TREE NUT, ALMOND, CARVEDILOL, LOSARTAN, ROSUVASTATIN    Referring_Ordering Physician: ??[employer]  Interpreting Group: ??[facility]  Interpreting Physician: ??Jun K. Dory Peru, MD  Technologist: ??Lauralyn Primes, RCS, RDCS    IMPRESSIONS:  A clinically significant myopathic process is ruled out. There was no  significant valvular heart disease.    SUMMARY:  Procedure information: This was a technically difficult study.    Left ventricle: Systolic function was normal by visual assessment.  Ejection fraction was estimated to be 60 %. No obvious wall motion  abnormalities identified in the views obtained.    Right ventricle: Systolic pressure was mildly increased. Estimated peak  pressure was 45 mmHg.    COMPARISONS:  Comparison was made with the previous study of 07-Nov-2015. Pulmonary  artery pressure has increased from 30 mmHg to 45 mmHg.    INDICATIONS: Abnormal cardiac markers.    HISTORY: Prior history: Risk factors: hypertension, diabetes, and CKD. PE.    PROCEDURE: This was a routine study. The study included complete 2D  imaging, M-mode, complete spectral Doppler, and color Doppler. The heart  rate was 96 bpm, at the start of the study. Systolic blood pressure was  101 mmHg, at the start of the study. Diastolic blood pressure was 71 mmHg,  at the start of the study.  Images were obtained from the parasternal,  apical, and subcostal acoustic windows. Echocardiographic views were  limited by poor acoustic window availability. This was a technically  difficult study.    LEFT VENTRICLE: Size was normal. Systolic function was normal by visual  assessment. Ejection fraction was estimated to be 60 %. No obvious wall  motion abnormalities identified in the views obtained. Wall thickness was  normal. DOPPLER: Left ventricular diastolic function parameters were  normal for the patient's age.    RIGHT VENTRICLE: The size was normal. Systolic function was normal.  DOPPLER: Systolic pressure was mildly increased. Estimated peak pressure  was 45 mmHg.    LEFT ATRIUM: Size was normal. LA volume index was 22 ml/m-sq.    RIGHT ATRIUM: Size was normal.    MITRAL VALVE: There was annular calcification. Normal valve structure.  DOPPLER: There was no evidence for stenosis. There was no significant  regurgitation.    AORTIC VALVE: The valve was probably trileaflet. Leaflets exhibited good  mobility. DOPPLER: There was no stenosis. There was no significant  regurgitation.    TRICUSPID VALVE: Normal valve structure. DOPPLER: There was no evidence  for tricuspid stenosis. There was mild regurgitation. Right atrial  pressure estimate: 3 mmHg.    PULMONIC VALVE: Not well visualized, but normal Doppler findings.    AORTA: The root exhibited normal size.    SYSTEMIC VEINS: IVC: The inferior vena cava was normal in size.  Respirophasic changes were normal.    PERICARDIUM: Insignificant pericardial effusion and/or pericardial fat was  present.    SYSTEM MEASUREMENT TABLES    2D  Ao Diam: 3.04 cm  IVSd: 0.97 cm  LVIDd: 4.03 cm  LVIDs: 2.45 cm  LVPWd: 1.01 cm  LAAs A2C: 16.29 cm2  LAAs A4C: 16.92 cm2  LAESV A-L A2C: 40.51 ml  LAESV A-L A4C: 41.65 ml  LAESV Index (A-L): 22.38 ml/m2  LAESV MOD A2C: 38.65 ml  LAESV MOD A4C: 39.84 ml  LAESV(A-L): 42.07 ml  LALs A2C: 5.56 cm  LALs A4C: 5.83 cm  LVOT Diam: 1.97  cm  RA Area: 13.39 cm2  RV Minor: 3.61 cm    CW  TR Vmax: 3.23 m/s  IVRT: 94.58 ms  TR maxPG: 41.81 mmHG    MM  Tapse: 2.07 cm    PW  LVOT VTI: 19.24 cm  LVOT Vmax: 1.14 m/s  LVOT Vmean: 0.79 m/s  MV A Vel: 1 m/s  MV Dec Slope: 4.98 m/s2  MV DecT: 191.67 ms  MV E Vel: 0.96 m/s  MV E/A Ratio: 0.95  E': 0.07 m/s  E/E': 14  LVOT maxPG: 5.18 mmHG  LVOT meanPG: 2.86 mmHG  LVSI Dopp: 31.33 ml/m2  LVSV Dopp: 58.89 ml  Lateral E': 0.1 m/s  Lateral E/E': 9.71    Prepared and E-signed by    Jun K. Dory Peru, MD  Signed 06-Jul-2016 16:16:43                   Consults:Cardiology and Pulmonary/Intensive care    Procedures:             Final diagnoses (primary and secondary): <principal problem not specified>                                            Active Problems:    Pneumonia (07/05/2016)      Depression ()      Anxiety ()      Mild intermittent asthma without complication (04/20/2015)      Type 2 diabetes mellitus without complication (HCC) (04/20/2015)      Hx of spinal fusion (08/17/2015)      Dyslipidemia (04/27/2016)      Community acquired pneumonia (07/05/2016)      Rhabdomyolysis (07/05/2016)            Brief hospital course               Discharge Exam: General appearance: alert, cooperative, no distress, appears stated age  Head: Normocephalic, without obvious abnormality, atraumatic  Eyes: negative findings: anicteric sclera and conjunctivae and sclerae normal  Neck: supple, symmetrical, trachea midline, no adenopathy, thyroid: not enlarged, symmetric, no tenderness/mass/nodules, no carotid bruit and no JVD  Lungs: clear to auscultation bilaterally except for fine rales R base  Heart: regular rate and rhythm, S1, S2 normal, no murmur, click, rub or gallop  Abdomen: soft, non-tender. Bowel sounds normal. No masses,  no organomegaly  Extremities: extremities normal, atraumatic, no cyanosis or edema  Pulses: 2+ and symmetric  Skin: Skin color, texture, turgor normal. No rashes or lesions         Postdischarge care/patient  self-management          Medications at discharge  including reasons for change and indications for new ones:   Current Discharge Medication List      START taking these medications    Details   budesonide (PULMICORT) 0.5 mg/2 mL nbsp 2 mL by Nebulization route two (2) times a day for 30 days.  Qty: 60 Each, Refills: 0      dilTIAZem CD (CARDIZEM CD) 120 mg ER capsule Take 1 Cap by mouth daily for 30 days.  Qty: 30 Cap, Refills: 0         CONTINUE these medications which have NOT CHANGED    Details  cyclobenzaprine (FLEXERIL) 5 mg tablet Take 1 Tab by mouth nightly.  Qty: 7 Tab, Refills: 0      oxyCODONE-acetaminophen (PERCOCET 10) 10-325 mg per tablet Take  by mouth.      tiotropium bromide (SPIRIVA RESPIMAT) 1.25 mcg/actuation inhaler Take 2 Puffs by inhalation daily.      meloxicam (MOBIC) 15 mg tablet Take 1 Tab by mouth daily.  Qty: 30 Tab, Refills: 0    Associated Diagnoses: Primary osteoarthritis of right knee      ALPRAZolam (XANAX) 0.5 mg tablet TK 1 T PO BID.  MAXIMUM 2 TS D.  Refills: 0      FREESTYLE LITE STRIPS strip USE TO CHECK BLOOD SUGAR BID  Refills: 5      busPIRone (BUSPAR) 15 mg tablet TK 1 T PO TID  Refills: 5      FREESTYLE LANCETS 28 gauge misc CHECK BLOOD SUGAR BID  Refills: 5      JANUMET 50-1,000 mg per tablet TK 1 T PO BID. STOP TAKING METFORMIN.  Refills: 0      pregabalin (LYRICA) 75 mg capsule Take 1 Cap by mouth three (3) times daily. Max Daily Amount: 225 mg.  Qty: 90 Cap, Refills: 1      XARELTO 20 mg tab tablet Take 1 Tab by mouth daily.  Refills: 4      azelastine (ASTELIN) 137 mcg (0.1 %) nasal spray 1 Spray by Both Nostrils route two (2) times a day.  Refills: 4      DULoxetine (CYMBALTA) 30 mg capsule Take 1 Cap by mouth daily.  Refills: 0      fluticasone (FLONASE) 50 mcg/actuation nasal spray 2 Sprays by Both Nostrils route nightly.  Refills: 0      hydroCHLOROthiazide (HYDRODIURIL) 25 mg tablet Take 1 Tab by mouth daily.  Refills: 0      mirtazapine (REMERON) 30 mg tablet  Take 1 Tab by mouth daily.  Refills: 0      montelukast (SINGULAIR) 10 mg tablet Take 1 Tab by mouth daily.  Refills: 1      omeprazole (PRILOSEC) 20 mg capsule Take 1 Cap by mouth daily.  Refills: 2      oxybutynin chloride XL (DITROPAN XL) 10 mg CR tablet Take 1 Tab by mouth daily.  Refills: 2      pravastatin (PRAVACHOL) 40 mg tablet Take 1 Tab by mouth daily.  Refills: 3      zolpidem (AMBIEN) 10 mg tablet Take 1 Tab by mouth nightly.  Refills: 0         STOP taking these medications       levoFLOXacin (LEVAQUIN) 750 mg tablet Comments:   Reason for Stopping:         fluticasone (FLOVENT DISKUS) 100 mcg/actuation dsdv Comments:   Reason for Stopping:         trimethoprim-polymyxin b (POLYTRIM) ophthalmic solution Comments:   Reason for Stopping:         hydrALAZINE (APRESOLINE) 25 mg tablet Comments:   Reason for Stopping:         budesonide-formoterol (SYMBICORT) 80-4.5 mcg/actuation HFAA inhaler Comments:   Reason for Stopping:         amLODIPine (NORVASC) 10 mg tablet Comments:   Reason for Stopping:                   Pending laboratory work and tests:         Condition at discharge and functional status: improved and stable  Diet at discharge Regular Diet        Activities at discharge: mostly moving around the house        Discharge destination: home with home health PT and home O2 for OSA at night        Care needed and who will provide it        Documentation of patient education and special instructions        Patients cognitive function    Advanced care plan    Advance directives or surrogate decision maker documented or reasons for not  providing advance care plan    Contact information/plan for follow up care      24-hour phone number for hospital physician record               Name of the PCP   Follow-up appointments with specific reason for an appointment and proposed      management plan: Follow-up with Dr. Nada Libman or Dr. Kenna Gilbert in 1 week.  Recommended follow up procedures and tests  including imaging tests, infusion, etc with dates: Follow-up tests/labs LFTs, CK               Recommendations of subspecialty consultants: Cardiology and Pulmonary/Intensive care               Anticipated problems and suggested interventions

## 2016-07-12 NOTE — Home Health (Signed)
Received HH referral , Discharge noted for today, [facility] and medication management /education,PT/OT;  Nebulizer machine ordered from DME rep Jasmine DecemberSharon at [facility], Jasmine DecemberSharon states that Nebulizer machine will be delivered today to pt's home along with CPAP machine and supplies, DME rep Jasmine DecemberSharon also states that pt did not qualify for oxygen.[facility]. C.ELEY,LPN.

## 2016-07-12 NOTE — Progress Notes (Signed)
1416 07/12/16    Upon consulting attending nurse, was informed of pt discharge approval shortly. Made contact with pt who reported "I think I'm ok, I'm leaving soon anyway's." Pt reports they are received CPAP machine and will be receiving home health.     Kristine LineaAustin Blakley Michna LPTA

## 2016-07-12 NOTE — Progress Notes (Signed)
Care Management Interventions  PCP Verified by CM: Yes (last seen "July 2017")  Mode of Transport at Discharge:  (family/friend)  Transition of Care Consult (CM Consult): Home Health  [facility]: Yes  Physical Therapy Consult: Yes  Occupational Therapy Consult: Yes  Speech Therapy Consult: Yes  Current Support Network: Other (lives with friend)  Confirm Follow Up Transport: Family  Plan discussed with Pt/Family/Caregiver: Yes  Freedom of Choice Offered: Yes (for home health)  Discharge Location  Discharge Placement: Home with home health     CPAP and supplies will be delivered to pt's home this evening by [facility]. Agency was inquiring about the mask size for pt. Pt has not been using equipment while inpatient. Agency will fit pt with mask at her home. [facility]OC presented for [facility] health and [facility]. Agency Arline Asp(Cindy) contacted and referral submitted.

## 2016-07-12 NOTE — Progress Notes (Addendum)
Care Management Interventions  PCP Verified by CM: Yes (last seen "July 2017")  Mode of Transport at Discharge:  (family/friend)  Transition of Care Consult (CM Consult): Discharge Planning  Physical Therapy Consult: Yes  Occupational Therapy Consult: Yes  Speech Therapy Consult: Yes  Current Support Network: Other (lives with friend)  Confirm Follow Up Transport: Family  Plan discussed with Pt/Family/Caregiver: Yes  Discharge Location  Discharge Placement:  (TBD)     CM contacted Cynthia Marsh at [facility] who shares that they have a pt's profile and they are waiting for CM to send results of sleep study.  CM contacted Cynthia Marsh with [facility] who shares that she was provided with sleep study completed in June, 2017.  Plan is for equipment to be delivered to home today. [facility] is requesting for size of mask pt will be needing.

## 2016-07-12 NOTE — Progress Notes (Signed)
Chaplain conducted a Follow up consultation and Spiritual Assessment for Cynthia Marsh, who is a 65 y.o.,female.      The Chaplain provided the following Interventions:  Continued the relationship of care and support.   Listened empathically.  Offered prayer and assurance of continued prayer on patients behalf.   Chart reviewed.    The following outcomes were achieved:  Patient expressed gratitude for chaplain's visit.    Assessment:  There are no further spiritual or religious issues which require Spiritual Care Services interventions at this time.     Plan:  Chaplains will continue to follow and will provide pastoral care on an as needed/requested basis.  Chaplain recommends bedside caregivers page chaplain on duty if patient shows signs of acute spiritual or emotional distress.       [facility] Zimbrick-Rogers  Chaplain Intern  Spiritual Care   [phone]

## 2016-07-12 NOTE — Discharge Summary (Signed)
Discharge Summary        Patient ID:        Cynthia Marsh        [id number]        65 y.o.        [birth year]        Admission Date: 07/05/2016      Discharge date and time: 07/12/2016        Inpatient care:   Problem that led to hospitalization: Active Problems:    Pneumonia (07/05/2016)      Depression ()      Anxiety ()      Mild intermittent asthma without complication (04/20/2015)      Type 2 diabetes mellitus without complication (HCC) (04/20/2015)      Hx of spinal fusion (08/17/2015)      Dyslipidemia (04/27/2016)      Community acquired pneumonia (07/05/2016)      Rhabdomyolysis (07/05/2016)      HPI:  65 y.o. female with h/o CKD, Asthma, HTN, PE 2016, and chronic back pain who presented to ED after evaluation at our office  ??She has been ??complaining of chest pain and SOB onset Sunday  Before admission  but worsened over the last 2 days before admission . Pt was seen at the ER twice last week end diagnosed with pneumonia and UTI  Pt started on Levaquin as outpatient without much response   ????  Pt has h/o chronic back pain ????Pt has been f/u spine center she had MRI of the Thoracic and lumbar spine to rule out discitis/ epidural abscess. Has chronic Lumbar back pain and is on pain management. MRI 07/03/16 thoracic spine showed pneumonia   1. Evidence of pneumonia in the right greater than left lower lobe.  2. Lower thoracic paravertebral enhancement felt to be related to vessels and  abutting reactive changes. No acute thoracic spine findings. Follow-up CT  imaging with contrast can be performed to exclude acute infectious paraspinal process here.  ????  ??pt was c/o cough , fatigue at the office and has been feeling getting worse pt was sent back to ER WBC, CPK was elevated 1272 troponin I normal repeat CTA showed   Less than optimal contrast bolus. Proximal pulmonary arteries are well  opacified, more distal vessels not as well opacified limiting evaluation for  pulmonary embolus beyond the lobar branches.  ????   Within the limitations of the exam there is no finding of central pulmonary  embolus.  ????  Lobar consolidation in the posterior right lower lobe consistent with pneumonia.  Right middle lobe opacity that may be atelectasis or infection.   ????   Hospital course:  Pt was started on zosyn and vancomycin urine strept AG and legionella negative . Pt continue to improve on IV ABT.  Pulmonology consulted, Dr Mcarthur Rossetti, and recommended continue 7 days IV antibiotic therapy due to outpatient failure of po levaquin.  ????  Pt has H/O chronic cough/ asthma  ??for 6 months had APPT with Dr Neale Burly before admission  but she couldn't make it.  Seen by Pulmonology, due to episode of SVT switched from symbicort to pulmicort and spiriva.  She should continue this and follow up with pulmonology as directed.  ??  8/2 pt  had RRT for SVT with HR 180s, patient was asymptomatic with normal BP during the episode, started on diltiazem drip that initially helped but then resumed HR 170s, given adenosine 6mg  x1 after pre-treatment with ativan and HR has been 90-110s  since without further SVT.  Cardiology consulted.  Transitioned to Cardizem CD 120mg  daily and should continue this until followed up with her cardiologist (Dr. Jeneen Montgomery).  Has remained in sinus rhythm since this event.     Hypertension.  Patient's norvasc was discontinued as she was started on cardizem.  Restarted HCTZ on day of discharge as blood pressure was starting to increse.  Continue to hold hydralazine until follow up with PCM to determine need to start.    Mild Rhabdo, likely from acute illness.  Statin held, restarted upon discharge, follow up with PCM to monitor LFTs.    Suspected OSA.  Overnight had desats, required 2L Oxygen via NC while sleeping.  Home oxygen arranged while patient awaiting sleep study and CPAP.      Some weakness noted by PT, recommended home health PT upon discharge.  ????  Result Information    ?? Status Provider Status       ?? Final result (Exam End: 07/05/2016 ??7:24 PM) Open    ??   Study Result    ?? CT Chest Pulmonary  ??  CPT CODE: 04540  ??  HISTORY: Chest pain and shortness of breath. History of PE.  ??  COMPARISON: Chest 2 views 07/05/2016. CT chest pulmonary 05/16/2016.  ??  TECHNIQUE: 2.5 millimeter contiguous axial images from the lung apices to the  level of the adrenal glands following timed contrast bolus for maximum  intravascular enhancement of the pulmonary arteries.  Images reviewed in soft  tissue and lung windows. Coronal and sagittal MIP reformations were performed  for better evaluation of tortuous branching pulmonary vessels. Isovue-370 100 mL  IV contrast.  ??  FINDINGS:   ??  Suboptimal contrast bolus timing. The main pulmonary artery and lobar branches  are fairly well opacified, but the segmental and distal branches are not as well  opacified with contrast. Within this limitation there is no definite finding of  an acute pulmonary embolus. No focal filling defect within the main pulmonary  arteries or lobar branches. Some heterogeneous contrast density within the right  upper lobe and right middle lobe segmental branches, but not occlusive.  ??  Aorta is moderately well opacified, appears normal.  ??  Small bilateral pleural effusions. Lobar consolidation in the posterior right  lower lobe. Some subsegmental opacification in the right middle lobe. Some  linear opacities in the lingula and posterior left lower lobe likely  subsegmental atelectasis.  ??  The heart is enlarged, similar to the prior exam. No pericardial effusion.  ??  Small hiatal hernia. Visible portions of the liver and spleen appear normal.  Adrenals are not enlarged. Visible portions of the pancreas and kidneys  unremarkable.  ??  IMPRESSION  IMPRESSION:  ??  Less than optimal contrast bolus. Proximal pulmonary arteries are well  opacified, more distal vessels not as well opacified limiting evaluation for  pulmonary embolus beyond the lobar branches.  ??   Within the limitations of the exam there is no finding of central pulmonary  embolus.  ??  Lobar consolidation in the posterior right lower lobe consistent with pneumonia.  Right middle lobe opacity that may be atelectasis or infection.     07/06/2016 ??4:16 PM - Edi, Card Result In   ??   Narrative    ?? [facility]  [address]  [place], [place] [address]  [phone]    Transthoracic Echocardiogram    Patient: Cynthia Marsh  MRN: [id number]  ACCT #: [id number]  DOB: June 12, 1951  Age: 78 years  Gender: Female  Height: 64 in  Weight: 180.6 lb  BSA: 1.88 m-sq  BP: 101 / 71 mmHg  Study date: 06-Jul-2016  Status: Routine  Location: [facility]  DMS ACC #: [id number]    Allergies: TREE NUT, ALMOND, CARVEDILOL, LOSARTAN, ROSUVASTATIN    Referring_Ordering Physician: ??[employer]  Interpreting Group: ??[facility]  Interpreting Physician: ??Jun K. Dory Peru, MD  Technologist: ??Lauralyn Primes, RCS, RDCS    IMPRESSIONS:  A clinically significant myopathic process is ruled out. There was no  significant valvular heart disease.    SUMMARY:  Procedure information: This was a technically difficult study.    Left ventricle: Systolic function was normal by visual assessment.  Ejection fraction was estimated to be 60 %. No obvious wall motion  abnormalities identified in the views obtained.    Right ventricle: Systolic pressure was mildly increased. Estimated peak  pressure was 45 mmHg.    COMPARISONS:  Comparison was made with the previous study of 07-Nov-2015. Pulmonary  artery pressure has increased from 30 mmHg to 45 mmHg.    INDICATIONS: Abnormal cardiac markers.    HISTORY: Prior history: Risk factors: hypertension, diabetes, and CKD. PE.    PROCEDURE: This was a routine study. The study included complete 2D  imaging, M-mode, complete spectral Doppler, and color Doppler. The heart  rate was 96 bpm, at the start of the study. Systolic blood pressure was   454 mmHg, at the start of the study. Diastolic blood pressure was 71 mmHg,  at the start of the study. Images were obtained from the parasternal,  apical, and subcostal acoustic windows. Echocardiographic views were  limited by poor acoustic window availability. This was a technically  difficult study.    LEFT VENTRICLE: Size was normal. Systolic function was normal by visual  assessment. Ejection fraction was estimated to be 60 %. No obvious wall  motion abnormalities identified in the views obtained. Wall thickness was  normal. DOPPLER: Left ventricular diastolic function parameters were  normal for the patient's age.    RIGHT VENTRICLE: The size was normal. Systolic function was normal.  DOPPLER: Systolic pressure was mildly increased. Estimated peak pressure  was 45 mmHg.    LEFT ATRIUM: Size was normal. LA volume index was 22 ml/m-sq.    RIGHT ATRIUM: Size was normal.    MITRAL VALVE: There was annular calcification. Normal valve structure.  DOPPLER: There was no evidence for stenosis. There was no significant  regurgitation.    AORTIC VALVE: The valve was probably trileaflet. Leaflets exhibited good  mobility. DOPPLER: There was no stenosis. There was no significant  regurgitation.    TRICUSPID VALVE: Normal valve structure. DOPPLER: There was no evidence  for tricuspid stenosis. There was mild regurgitation. Right atrial  pressure estimate: 3 mmHg.    PULMONIC VALVE: Not well visualized, but normal Doppler findings.    AORTA: The root exhibited normal size.    SYSTEMIC VEINS: IVC: The inferior vena cava was normal in size.  Respirophasic changes were normal.    PERICARDIUM: Insignificant pericardial effusion and/or pericardial fat was  present.    SYSTEM MEASUREMENT TABLES    2D  Ao Diam: 3.04 cm  IVSd: 0.97 cm  LVIDd: 4.03 cm  LVIDs: 2.45 cm  LVPWd: 1.01 cm  LAAs A2C: 16.29 cm2  LAAs A4C: 16.92 cm2  LAESV A-L A2C: 40.51 ml  LAESV A-L A4C: 41.65 ml  LAESV Index (A-L): 22.38 ml/m2  LAESV MOD A2C: 38.65 ml  LAESV MOD A4C: 39.84 ml  LAESV(A-L): 42.07 ml  LALs A2C: 5.56 cm  LALs A4C: 5.83 cm  LVOT Diam: 1.97 cm  RA Area: 13.39 cm2  RV Minor: 3.61 cm    CW  TR Vmax: 3.23 m/s  IVRT: 94.58 ms  TR maxPG: 41.81 mmHG    MM  Tapse: 2.07 cm    PW  LVOT VTI: 19.24 cm  LVOT Vmax: 1.14 m/s  LVOT Vmean: 0.79 m/s  MV A Vel: 1 m/s  MV Dec Slope: 4.98 m/s2  MV DecT: 191.67 ms  MV E Vel: 0.96 m/s  MV E/A Ratio: 0.95  E': 0.07 m/s  E/E': 14  LVOT maxPG: 5.18 mmHG  LVOT meanPG: 2.86 mmHG  LVSI Dopp: 31.33 ml/m2  LVSV Dopp: 58.89 ml  Lateral E': 0.1 m/s  Lateral E/E': 9.71    Prepared and E-signed by    Jun K. Dory Peruhung, MD  Signed 06-Jul-2016 16:16:43                   Consults:Cardiology and Pulmonary/Intensive care    Procedures:             Final diagnoses (primary and secondary): <principal problem not specified>                                            Active Problems:    Pneumonia (07/05/2016)      Depression ()      Anxiety ()      Mild intermittent asthma without complication (04/20/2015)      Type 2 diabetes mellitus without complication (HCC) (04/20/2015)      Hx of spinal fusion (08/17/2015)      Dyslipidemia (04/27/2016)      Community acquired pneumonia (07/05/2016)      Rhabdomyolysis (07/05/2016)            Brief hospital course               Discharge Exam: General appearance: alert, cooperative, no distress, appears stated age  Head: Normocephalic, without obvious abnormality, atraumatic  Eyes: negative findings: anicteric sclera and conjunctivae and sclerae normal  Neck: supple, symmetrical, trachea midline, no adenopathy, thyroid: not enlarged, symmetric, no tenderness/mass/nodules, no carotid bruit and no JVD  Lungs: clear to auscultation bilaterally except for fine rales R base  Heart: regular rate and rhythm, S1, S2 normal, no murmur, click, rub or gallop  Abdomen: soft, non-tender. Bowel sounds normal. No masses,  no organomegaly  Extremities: extremities normal, atraumatic, no cyanosis or edema  Pulses: 2+ and symmetric   Skin: Skin color, texture, turgor normal. No rashes or lesions         Postdischarge care/patient self-management          Medications at discharge  including reasons for change and indications for new ones:   Current Discharge Medication List      START taking these medications    Details   budesonide (PULMICORT) 0.5 mg/2 mL nbsp 2 mL by Nebulization route two (2) times a day for 30 days.  Qty: 60 Each, Refills: 0      dilTIAZem CD (CARDIZEM CD) 120 mg ER capsule Take 1 Cap by mouth daily for 30 days.  Qty: 30 Cap, Refills: 0         CONTINUE these medications which have NOT CHANGED    Details   cyclobenzaprine (  FLEXERIL) 5 mg tablet Take 1 Tab by mouth nightly.  Qty: 7 Tab, Refills: 0      oxyCODONE-acetaminophen (PERCOCET 10) 10-325 mg per tablet Take  by mouth.      tiotropium bromide (SPIRIVA RESPIMAT) 1.25 mcg/actuation inhaler Take 2 Puffs by inhalation daily.      meloxicam (MOBIC) 15 mg tablet Take 1 Tab by mouth daily.  Qty: 30 Tab, Refills: 0    Associated Diagnoses: Primary osteoarthritis of right knee      ALPRAZolam (XANAX) 0.5 mg tablet TK 1 T PO BID.  MAXIMUM 2 TS D.  Refills: 0      FREESTYLE LITE STRIPS strip USE TO CHECK BLOOD SUGAR BID  Refills: 5      busPIRone (BUSPAR) 15 mg tablet TK 1 T PO TID  Refills: 5      FREESTYLE LANCETS 28 gauge misc CHECK BLOOD SUGAR BID  Refills: 5      JANUMET 50-1,000 mg per tablet TK 1 T PO BID. STOP TAKING METFORMIN.  Refills: 0      pregabalin (LYRICA) 75 mg capsule Take 1 Cap by mouth three (3) times daily. Max Daily Amount: 225 mg.  Qty: 90 Cap, Refills: 1      XARELTO 20 mg tab tablet Take 1 Tab by mouth daily.  Refills: 4      azelastine (ASTELIN) 137 mcg (0.1 %) nasal spray 1 Spray by Both Nostrils route two (2) times a day.  Refills: 4      DULoxetine (CYMBALTA) 30 mg capsule Take 1 Cap by mouth daily.  Refills: 0      fluticasone (FLONASE) 50 mcg/actuation nasal spray 2 Sprays by Both Nostrils route nightly.  Refills: 0       hydroCHLOROthiazide (HYDRODIURIL) 25 mg tablet Take 1 Tab by mouth daily.  Refills: 0      mirtazapine (REMERON) 30 mg tablet Take 1 Tab by mouth daily.  Refills: 0      montelukast (SINGULAIR) 10 mg tablet Take 1 Tab by mouth daily.  Refills: 1      omeprazole (PRILOSEC) 20 mg capsule Take 1 Cap by mouth daily.  Refills: 2      oxybutynin chloride XL (DITROPAN XL) 10 mg CR tablet Take 1 Tab by mouth daily.  Refills: 2      pravastatin (PRAVACHOL) 40 mg tablet Take 1 Tab by mouth daily.  Refills: 3      zolpidem (AMBIEN) 10 mg tablet Take 1 Tab by mouth nightly.  Refills: 0         STOP taking these medications       levoFLOXacin (LEVAQUIN) 750 mg tablet Comments:   Reason for Stopping:         fluticasone (FLOVENT DISKUS) 100 mcg/actuation dsdv Comments:   Reason for Stopping:         trimethoprim-polymyxin b (POLYTRIM) ophthalmic solution Comments:   Reason for Stopping:         hydrALAZINE (APRESOLINE) 25 mg tablet Comments:   Reason for Stopping:         budesonide-formoterol (SYMBICORT) 80-4.5 mcg/actuation HFAA inhaler Comments:   Reason for Stopping:         amLODIPine (NORVASC) 10 mg tablet Comments:   Reason for Stopping:                   Pending laboratory work and tests:         Condition at discharge and functional status: improved and stable  Diet at discharge Regular Diet        Activities at discharge: mostly moving around the house        Discharge destination: home with home health PT and home O2 for OSA at night        Care needed and who will provide it        Documentation of patient education and special instructions        Patients cognitive function    Advanced care plan    Advance directives or surrogate decision maker documented or reasons for not  providing advance care plan    Contact information/plan for follow up care      24-hour phone number for hospital physician record               Name of the PCP   Follow-up appointments with specific reason for an appointment and  proposed      management plan: Follow-up with Dr. Nada Libman or Dr. Kenna Gilbert in 1 week.  Recommended follow up procedures and tests including imaging tests, infusion, etc with dates: Follow-up tests/labs LFTs, CK               Recommendations of subspecialty consultants: Cardiology and Pulmonary/Intensive care               Anticipated problems and suggested interventions

## 2016-07-14 ENCOUNTER — Encounter: Admit: 2016-07-14 | Discharge: 2016-07-14 | Payer: PRIVATE HEALTH INSURANCE | Primary: Family Medicine

## 2016-07-18 ENCOUNTER — Encounter: Primary: Family Medicine

## 2016-07-18 ENCOUNTER — Ambulatory Visit: Admit: 2016-07-18 | Payer: MEDICARE | Attending: Family | Primary: Family Medicine

## 2016-07-18 ENCOUNTER — Encounter: Admit: 2016-07-18 | Discharge: 2016-07-18 | Payer: PRIVATE HEALTH INSURANCE | Primary: Family Medicine

## 2016-07-18 DIAGNOSIS — R0789 Other chest pain: Secondary | ICD-10-CM

## 2016-07-18 NOTE — Telephone Encounter (Signed)
Spoke with patient. Explained to her that she was supposed to be d/c home from the hospital with nighttime O2 until patient received her CPAP machine. Explained that when she uses her cpap machine at night, it should be helping to correct her low O2 sats with sleep. Patient verbalized understanding.

## 2016-07-18 NOTE — Progress Notes (Addendum)
Cynthia Marsh presents today for post hospital follow-up.  She was hospitalized from July 05, 2016 through July 12, 2016 for pneumonia.  Prior to hospitalization, she was treated for pneumonia and a UTI the week before for which she took Levaquin with no improvement.  She had a MRI done of the thoracic spine on July 03, 2016 which showed pneumonia, right greater than left.  She was started on Zosyn and vancomycin and followed by pulmonology.  During her hospitalization, she had an episode of SVT with a heart rate in the 180s.  She was a symptomatic with normal blood pressure and was started on a Cardizem drip that initially helped but her heart rate went back up.  She was given adenosine ??1 and she converted back to normal sinus rhythm.  She was transitioned to oral Cardizem afterwards.  Obstructive sleep apnea was suspected and prior to being discharged home, home oxygen was arranged and she is awaiting sleep study and her CPAP machine.    She is a 65 year old African-American female with history of chronic kidney disease, asthma, hypertension, chronic back pain, and pulmonary embolus in 2016.  She was last seen by Dr. Zoila Shutterosenberg on Apr 27, 2016.  Prior to that visit, she was complaining of intermittent sharp stabbing chest pain and she underwent a pharmacologic nuclear stress test in April 2017 which was considered negative and low risk.  Her ejection fraction was estimated to be 67% and there was no evidence of ischemia or prior infarct.  The chest pain that she is currently complaining about is unchanged from the previous pains that she complained about earlier this year.  During her hospitalization, she had an echocardiogram done (07/06/2016) and it showed an ejection fraction of 60%, no wall motion abnormalities, and RVSP of 45 mmHg (which is increased from 30 mmHg on echo done in December 2016).    She states that she has been feeling "okay."  She continues to have  intermittent episodes of sharp, stabbing chest pain (again similar to the pain she has had in the past).  It usually last for a few seconds and resolves on its own. It is not brought on by strenuous activity and is not accompanied by any other symptoms.  She denies chest tightness, heaviness, and palpitations.  She denies shortness of breath at rest, dyspnea on exertion, orthopnea and PND.   She denies abdominal bloating.  She admits to occasional lightheadedness, dizziness, and denies syncope.   She denies lower extremity edema and claudication.  Denies nausea, vomiting, diarrhea, melena, hematochezia.  Denies hematuria, urgency, frequency.  Denies fever, chills.  She complains of chronic back pain and she uses a wheeled walker for support when ambulating.      PMH:  Past Medical History:   Diagnosis Date   ??? Anxiety    ??? Asthma    ??? 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 Cardiac echocardiogram 11/07/2015    EF 60%.  No RWMA.  Gr 1 DDfx.  RVSP 30 mmHg.     ??? Cardiovascular lower extremity venous duplex 11/06/2015    No DVT bilaterally.   ??? Chronic kidney disease    ??? Depression    ??? Developmental delay    ??? Diabetes (HCC)    ??? Diabetes mellitus (HCC)    ??? History of nuclear stress test 03/2016    No ischemia, no infarct, EF 67%.  Low risk study   ??? Hypertension    ??? Insomnia    ???  Osteoporosis    ??? Pneumonia 2010   ??? Thromboembolus (HCC)        PSH:  Past Surgical History:   Procedure Laterality Date   ??? BREAST SURGERY PROCEDURE UNLISTED       Left cyst removed    ??? HAND/FINGER SURGERY UNLISTED     ??? HX BACK SURGERY  2012    L4/L5/S1 fused in [place]   ??? HX CYST INCISION AND DRAINAGE Left    ??? HX CYST REMOVAL      calf    ??? HX GYN      complete hyst    ??? HX GYN      tubal ligation   ??? HX HYSTERECTOMY     ??? HX KNEE ARTHROSCOPY     ??? HX OOPHORECTOMY     ??? HX ORTHOPAEDIC      Right shoulder rotator cuff    ??? HX ORTHOPAEDIC      Left Little toe hammer toe, left big toe bunion removed   ??? HX ORTHOPAEDIC       Right knee torn meniscus   ??? HX ORTHOPAEDIC      CTS bilateral , Left thumb trigger finger    ??? HX WRIST FRACTURE TX     ??? LAMINECTOMY,CERVICAL     ??? PR ANESTH,SURGERY OF SHOULDER         MEDS:  Current Outpatient Prescriptions   Medication Sig   ??? budesonide (PULMICORT) 0.5 mg/2 mL nbsp 2 mL by Nebulization route two (2) times a day for 30 days.   ??? dilTIAZem CD (CARDIZEM CD) 120 mg ER capsule Take 1 Cap by mouth daily for 30 days.   ??? cyclobenzaprine (FLEXERIL) 5 mg tablet Take 1 Tab by mouth nightly.   ??? oxyCODONE-acetaminophen (PERCOCET 10) 10-325 mg per tablet Take  by mouth.   ??? tiotropium bromide (SPIRIVA RESPIMAT) 1.25 mcg/actuation inhaler Take 2 Puffs by inhalation daily.   ??? meloxicam (MOBIC) 15 mg tablet Take 1 Tab by mouth daily.   ??? FREESTYLE LITE STRIPS strip USE TO CHECK BLOOD SUGAR BID   ??? busPIRone (BUSPAR) 15 mg tablet TK 1 T PO TID   ??? FREESTYLE LANCETS 28 gauge misc CHECK BLOOD SUGAR BID   ??? JANUMET 50-1,000 mg per tablet TK 1 T PO BID. STOP TAKING METFORMIN.   ??? pregabalin (LYRICA) 75 mg capsule Take 1 Cap by mouth three (3) times daily. Max Daily Amount: 225 mg.   ??? XARELTO 20 mg tab tablet Take 1 Tab by mouth daily.   ??? azelastine (ASTELIN) 137 mcg (0.1 %) nasal spray 1 Spray by Both Nostrils route two (2) times a day.   ??? DULoxetine (CYMBALTA) 30 mg capsule Take 1 Cap by mouth daily.   ??? hydroCHLOROthiazide (HYDRODIURIL) 25 mg tablet Take 1 Tab by mouth daily.   ??? mirtazapine (REMERON) 30 mg tablet Take 1 Tab by mouth daily.   ??? montelukast (SINGULAIR) 10 mg tablet Take 1 Tab by mouth daily.   ??? omeprazole (PRILOSEC) 20 mg capsule Take 1 Cap by mouth daily.   ??? oxybutynin chloride XL (DITROPAN XL) 10 mg CR tablet Take 1 Tab by mouth daily.   ??? zolpidem (AMBIEN) 10 mg tablet Take 1 Tab by mouth nightly.   ??? ALPRAZolam (XANAX) 0.5 mg tablet TK 1 T PO BID.  MAXIMUM 2 TS D.   ??? fluticasone (FLONASE) 50 mcg/actuation nasal spray 2 Sprays by Both Nostrils route nightly.    ??? pravastatin (PRAVACHOL)  40 mg tablet Take 1 Tab by mouth daily.     No current facility-administered medications for this visit.        Allergies and Sensitivities:  Allergies   Allergen Reactions   ??? 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   ??? Almond Other (comments)     Tongue itches   ??? Carvedilol Other (comments)   ??? Cozaar [Losartan] Cough   ??? Crestor [Rosuvastatin] Unknown (comments)     Heart beats fast       Family History:  Family History   Problem Relation Age of Onset   ??? Diabetes Mother    ??? Diabetes Brother    ??? Cancer Sister    ??? Cancer Maternal Grandmother    ??? Cancer Daughter        Social History:  She  reports that she has never smoked. She has never used smokeless tobacco.  She  reports that she drinks alcohol.      Physical:  Visit Vitals   ??? BP 100/76   ??? Pulse 88   ??? Ht 5\' 4"  (1.626 m)   ??? Wt 78.9 kg (174 lb)   ??? SpO2 96%   ??? BMI 29.87 kg/m2         Exam:  Neck:  Supple, no JVD, no carotid bruits  CV:  Normal S1 and  S2, no murmurs, rubs, or gallops noted  Lungs:  Clear to ausculation throughout, no wheezes or rales  Abd:  Soft, non-tender, non-distended with good bowel sounds.  No hepatosplenomegaly  Extremities:  No edema      Data:  EKG:   Normal sinus rhythm, rate 88. ??Low voltage in precordial leads. ??Otherwise normal tracing      LABS:  Lab Results   Component Value Date/Time    Sodium 141 07/12/2016 04:50 AM    Potassium 3.6 07/12/2016 04:50 AM    Chloride 107 07/12/2016 04:50 AM    CO2 29 07/12/2016 04:50 AM    Glucose 108 07/12/2016 04:50 AM    BUN 9 07/12/2016 04:50 AM    Creatinine 0.78 07/12/2016 04:50 AM     Lab Results   Component Value Date/Time    Cholesterol, total 113 07/06/2016 02:36 AM    HDL Cholesterol 21 07/06/2016 02:36 AM    LDL, calculated 62.2 07/06/2016 02:36 AM    Triglyceride 149 07/06/2016 02:36 AM    CHOL/HDL Ratio 5.4 07/06/2016 02:36 AM     Lab Results   Component Value Date/Time    ALT (SGPT) 37 07/12/2016 04:50 AM          Impression/Plan:  1.  Atypical chest pain  2.  Hypertension, blood pressure low normal  3.  History of pulmonary embolus in 2016  4.  Chronic kidney disease  5.  Asthma  6.  Chronic back pain    Cynthia Marsh seen today for a post-hospital follow-up.  Since being discharged home, she states that she has had intermittent episodes of sharp, stabbing chest pain similar to what she has been complaining about for several months.  It is not accompanied by any other symptoms and is not brought on by strenuous activity.  A pharmacologic nuclear stress test done in April of this year was negative and considered normal.  There were no perfusion imaging abnormalities concerning for ischemia or previous infarct.  She had an echocardiogram done (07/06/2016) and it showed an ejection fraction of 60%, no wall motion abnormalities,  and RVSP of 45 mmHg (which is increased from 30 mmHg on echo done in December 2016).  She denies any episodes of palpitations or rapid heart beats.  She complains of some mild dizziness/lightheadedness.      Her blood pressure today is borderline low today at 100/76.  Her heart rate is stable at 88 bpm.  Her lungs are clear and she does not exhibit any signs of volume overload at this time.  She was instructed to decrease her HCTZ to 12.5mg  once a day and return for a blood pressure check in 2 weeks.      She will follow-up with Dr. Zoila Shutter as scheduled and as needed.      Stevenson Clinch MSN, FNP-BC    Please note:  Portions of this chart were created with Dragon medical speech to text program.  Unrecognized errors may be present.

## 2016-07-18 NOTE — Patient Instructions (Addendum)
Decrease HCTZ (hydrochlorothiazide) to 12.5mg  once a day.  Take 1/2 of the 25mg  tablets once a day  All other medications to remain the same  Blood pressure recheck in 2 weeks.  Follow-up with Dr. Zoila Shutterosenberg as scheduled and as needed

## 2016-07-18 NOTE — Telephone Encounter (Signed)
Pt said that she has [facility] but that [facility] did not give her oxygen. She said that she is supposed to be on oxygen at night. Please call [phone]

## 2016-07-18 NOTE — Progress Notes (Signed)
1. Have you been to the ER, urgent care clinic since your last visit?  Hospitalized since your last visit?Yes  07/05/16  Chest pain/SOB     2. Have you seen or consulted any other health care providers [facility] since your last visit?  Include any pap smears or colon screening. No

## 2016-07-19 ENCOUNTER — Encounter: Admit: 2016-07-19 | Discharge: 2016-07-19 | Payer: PRIVATE HEALTH INSURANCE | Primary: Family Medicine

## 2016-07-20 ENCOUNTER — Encounter: Primary: Family Medicine

## 2016-07-21 ENCOUNTER — Encounter: Primary: Family Medicine

## 2016-07-22 ENCOUNTER — Encounter: Admit: 2016-07-22 | Discharge: 2016-07-22 | Payer: PRIVATE HEALTH INSURANCE | Primary: Family Medicine

## 2016-07-25 ENCOUNTER — Encounter: Admit: 2016-07-25 | Discharge: 2016-07-25 | Payer: PRIVATE HEALTH INSURANCE | Primary: Family Medicine

## 2016-07-25 NOTE — Telephone Encounter (Signed)
Pt wants dr to know she finally received her cpap machine and will start using it tonight 07/25/16.  But she has not gotten her oxygen yet.

## 2016-07-25 NOTE — Telephone Encounter (Signed)
Spoke with pt. She was asking again about the oxygen that was ordered at d/c from hospital to hold her over until she had sleep study and got on CPAP. She spoke with Shanda BumpsJessica last Monday and was advised also she does not need oxygen now she is on the CPAP. Pt remembers now about the call last week and states she understands now.

## 2016-07-25 NOTE — Telephone Encounter (Signed)
L/M

## 2016-07-26 ENCOUNTER — Inpatient Hospital Stay: Admit: 2016-07-26 | Payer: PRIVATE HEALTH INSURANCE | Primary: Family Medicine

## 2016-07-26 ENCOUNTER — Encounter: Admit: 2016-07-26 | Discharge: 2016-07-26 | Payer: PRIVATE HEALTH INSURANCE | Primary: Family Medicine

## 2016-07-26 ENCOUNTER — Encounter

## 2016-07-26 DIAGNOSIS — J159 Unspecified bacterial pneumonia: Secondary | ICD-10-CM

## 2016-07-27 ENCOUNTER — Encounter: Admit: 2016-07-27 | Discharge: 2016-07-27 | Payer: PRIVATE HEALTH INSURANCE | Primary: Family Medicine

## 2016-07-28 ENCOUNTER — Ambulatory Visit: Attending: Physical Medicine & Rehabilitation | Primary: Family Medicine

## 2016-07-28 ENCOUNTER — Ambulatory Visit
Admit: 2016-07-28 | Discharge: 2016-07-28 | Payer: MEDICARE | Attending: Physical Medicine & Rehabilitation | Primary: Family Medicine

## 2016-07-28 ENCOUNTER — Encounter: Primary: Family Medicine

## 2016-07-28 DIAGNOSIS — M961 Postlaminectomy syndrome, not elsewhere classified: Secondary | ICD-10-CM

## 2016-07-28 MED ORDER — PREGABALIN 25 MG CAP
25 mg | ORAL_CAPSULE | ORAL | 2 refills | Status: DC
Start: 2016-07-28 — End: 2016-07-29

## 2016-07-28 MED ORDER — KETOROLAC TROMETHAMINE 15 MG/ML INJECTION
15 mg/mL | Freq: Once | INTRAMUSCULAR | 0 refills | Status: AC
Start: 2016-07-28 — End: 2016-07-28

## 2016-07-28 NOTE — Progress Notes (Signed)
[facility]  [address]  [place], [place] [address]  Phone: [phone]  Fax: [phone]        [name], [name]  DOB: 09-02-1951  PCP: Lenn Sink, MD    PROGRESS NOTE      ASSESSMENT AND PLAN    Diagnoses and all orders for this visit:    1. Lumbar post-laminectomy syndrome  -     pregabalin (LYRICA) 25 mg capsule; 1 tab PO TID  Indications: pain  -     KETOROLAC TROMETHAMINE INJ  -     ketorolac (TORADOL) 15 mg/mL soln injection; 2 mL by IntraMUSCular route once for 1 dose. Indications: pain  -     THER/PROPH/DIAG INJECTION, SUBCUT/IM    1. Increase Lyrica to 100 mg TID. Discussed target range of 300-450mg /day  2. Continue Flexeril.   3. Given care information for chronic pain.   4. Pt does not wish to go to Pain Management.       Follow-up Disposition:  Return if symptoms worsen or fail to improve.      HISTORY OF PRESENT ILLNESS  Cynthia Marsh is a 65 y.o. female. Pt presents to the office for a f/u visit for left sciatica. Last visit pt was released to PCP. Pt was voluntarily discharged from Pain Management in 04/2016 from [facility]. She was sent a list of pain management providers. She contacted office about procedures recommended by Dr. Sheron Nightingale, she is no longer seeing him    Pt was in the hospital due to pneumonia, still feels weak, has home health, PT. Pt uses CPAP at night. Pt presents to the office today with recurring back pain. She notes that she now has paresthesias in her BLE. She has a short standing and walking tolerance. Her back pain wraps around her hips and into her BLE. Pt consults with Dr. Liliane Channel and states that she requires a RT knee replacement. Since she was discharged from the hospital, she has been having weakness in her BLE. She was seen by Dr. Sheron Nightingale who wanted to do a procedure on her that pt was uncomfortable with. Pt has muscle spasms in her back as well. Pt unable to perform day  to day activities due to pain. No saddle paresthesia. She is on Flexeril without benefit. Pt is on Lyrica 75 TID. Denies persistent fevers, chills, weight changes, neurogenic bowel or bladder symptoms. Pt denies any recent GI ulcers, bleeds or renal dysfucntion.  Tired of taking meds.  Had recent thoracic and lumbar MRIs done, intact fusion, deg jx changes at L3/4.    On chronic Xeralto, did ok w/last Toradol IM. Wants another injection, will give low dose.    Pain Assessment  07/28/2016   Location of Pain Back;Leg   Location Modifiers Right;Left   Severity of Pain 9   Quality of Pain Dull;Aching   Quality of Pain Comment weakness   Duration of Pain -   Frequency of Pain Constant   Aggravating Factors Walking;Standing   Limiting Behavior -   Relieving Factors (No Data)   Relieving Factors Comment sitting pain med   Result of Injury -   Work-Related Injury -   Type of Injury -             MRI Results:    Results from Hospital Encounter encounter on 07/03/16   MRI [facility] SPINE W WO CONT   Narrative MRI [facility] SPINE W WO CONT    INDICATION:  pain, fever, remote surgical hx. Arrived in the emergency  department today and yesterday for lower back pain and sciatica for one week,  chronic following multiple fusion surgeries a few years ago. Decreased range of  motion, tenderness and spasm at the lumbar back. Symptoms worse today.    COMPARISON: CTA chest 05/16/2016    CONTRAST:    7.5 cc IV Gadavist.    TECHNIQUE:   Pre and postcontrast sagittal and axial imaging of the thoracic spine.    FINDINGS:  Partial imaging of the lungs demonstrates consolidation in the right lower lobe,  and more mild in the left lower lobe. Remainder of the mediastinum and aortic  appearance is stable. No hydronephrosis.    No abnormality or enhancement seen in the thoracic spinal canal. No  spondylolisthesis. Vertebral body heights are maintained, without evidence of  acute fracture. No suspicious bone lesions or definitive marrow enhancement   abnormality. There is some paravertebral enhancement along T9 and T10 levels  that could be attributed to prominent vasculature or adjacent pneumonia/pleural  change. Disc space narrowing throughout the mid thoracic levels. No spinal  stenosis. Neural foramen are widely patent.    IMPRESSION:  1. Evidence of pneumonia in the right greater than left lower lobe.  2. Lower thoracic paravertebral enhancement felt to be related to vessels and  abutting reactive changes. No acute thoracic spine findings. Follow-up CT  imaging with contrast can be performed to exclude acute infectious paraspinal  process here.      MRI LUMB SPINE W WO CONT   Narrative MRI LUMB SPINE W WO CONT    INDICATION:   pain, fever, remote surgical hx. Emergency department visit today  and yesterday for worsening back pain and sciatica. Worsening symptomatology  today.    COMPARISON: Lumbar spine CT 02/04/2016    TECHNIQUE:   MR imaging of the lumbar spine was performed with and without contrast.  7.5 cc  IV Gadavist    FINDINGS:  L4-L5-S1 posterior fusion with bilateral pedicle screws and rods. Satisfactory  alignment. Minimal anterolisthesis of L3 on L4. Vertebral body heights are  maintained. No acute fracture. The T1 and T2 signal intensities of the surgical  levels L4 and L5 are within normal limits, with postcontrast enhancement  abnormalities potentially artifact related to altered fat saturation from  hardware. Postsurgical collection within the laminectomy bed of L5, not rim  enhancement, and may represent seroma. No acute retroperitoneal findings. Lower  thoracic and lumbar spinal canal is without enhancement abnormality or critical  spinal stenosis. Perineural cysts in the lower sacral canal.    T12/L1:  The spinal canal and neuroforamina are widely patent.    L1/2:  The spinal canal and neural foramina are widely patent.    L2/3:  Mild canal narrowing without critical stenosis. Neural foramen are  patent.     L3/4:  Mild central disc protrusion with mild canal stenosis. Contributing  hypertrophy of the facet joints and ligamentum flavum. Neural foramen are widely  patent.    L4/5:  Fusion level. No spinal stenosis or abnormal canal enhancement. Neural  foramen are widely patent.     L5/S1:  Fusion level. No spinal stenosis. Neural foramen are patent, mildly  narrowed bilaterally. Partly enhancing tissue surrounding the thecal sac within  the spinal canal at this level may contribute to impingement of exiting L5  bilateral nerves.    IMPRESSION:  1. Satisfactory appearance of the lumbosacral fusion. Partly enhancing fibrotic  tissue in the L5  spinal canal may contribute to impingement of preemergent L5  nerves, with mild narrowing of the neural foramen.  2. Mild postsurgical collection in the L5 laminectomy bed. Seroma favored.  3. Mild degenerative change, with mild L3/L4 canal stenosis.      Results from Hospital Encounter encounter on 12/03/15   MRI BRAIN WO CONT   Narrative MRI brain without contrast.    History: Dizziness, unsteady gait with weakness on the left side and unable to  ambulate this morning. Disoriented. Mild slurred speech and drowsiness.    Comparison: None.    Technique: Axial FSE T2, FLAIR, and T1 weighted images were obtained in addition  to sagittal T1-weighted images, coronal GRE images and axial diffusion weighted  images without contrast.    Findings:    The sulcal pattern and ventricular system are normal in size and configuration  for age, with mild atrophy. No parenchymal signal abnormality appreciated.  No  evidence of restricted diffusion to suggest acute infarction.  No gross evidence  of intracranial hemorrhage.  No mass effect.  Flow voids are present at major  vessels at the base of the brain suggesting patency.  The paranasal sinuses are  well aerated.  The mastoid air cells are clear.  The visualized bony structures  are unremarkable.         Impression Impression:     Unremarkable exam for patient age.              PAST MEDICAL HISTORY   Past Medical History:   Diagnosis Date   ??? Anxiety    ??? Asthma    ??? 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 Cardiac echocardiogram 11/07/2015    EF 60%.  No RWMA.  Gr 1 DDfx.  RVSP 30 mmHg.     ??? Cardiovascular lower extremity venous duplex 11/06/2015    No DVT bilaterally.   ??? Chronic kidney disease    ??? Depression    ??? Developmental delay    ??? Diabetes (HCC)    ??? Diabetes mellitus (HCC)    ??? History of nuclear stress test 03/2016    No ischemia, no infarct, EF 67%.  Low risk study   ??? Hypertension    ??? Insomnia    ??? Osteoporosis    ??? Pneumonia 2010   ??? Thromboembolus [facility])        Past Surgical History:   Procedure Laterality Date   ??? BREAST SURGERY PROCEDURE UNLISTED       Left cyst removed    ??? HAND/FINGER SURGERY UNLISTED     ??? HX BACK SURGERY  2012    L4/L5/S1 fused in [place]   ??? HX CYST INCISION AND DRAINAGE Left    ??? HX CYST REMOVAL      calf    ??? HX GYN      complete hyst    ??? HX GYN      tubal ligation   ??? HX HYSTERECTOMY     ??? HX KNEE ARTHROSCOPY     ??? HX OOPHORECTOMY     ??? HX ORTHOPAEDIC      Right shoulder rotator cuff    ??? HX ORTHOPAEDIC      Left Little toe hammer toe, left big toe bunion removed   ??? HX ORTHOPAEDIC      Right knee torn meniscus   ??? HX ORTHOPAEDIC      CTS bilateral , Left thumb trigger finger    ??? HX WRIST  FRACTURE TX     ??? LAMINECTOMY,CERVICAL     ??? PR ANESTH,SURGERY OF SHOULDER     .      MEDICATIONS    Current Outpatient Prescriptions   Medication Sig Dispense Refill   ??? OXYGEN-AIR DELIVERY SYSTEMS 1 L by Nasal route daily. bedtime with cpap     ??? budesonide (PULMICORT) 0.5 mg/2 mL nbsp 2 mL by Nebulization route two (2) times a day for 30 days. 60 Each 0   ??? dilTIAZem CD (CARDIZEM CD) 120 mg ER capsule Take 1 Cap by mouth daily for 30 days. 30 Cap 0   ??? ALPRAZolam (XANAX) 0.5 mg tablet TK 1 T PO BID.  MAXIMUM 2 TS D.  0   ??? FREESTYLE LITE STRIPS strip USE TO CHECK BLOOD SUGAR BID  5    ??? busPIRone (BUSPAR) 15 mg tablet TK 1 T PO TID  5   ??? FREESTYLE LANCETS 28 gauge misc CHECK BLOOD SUGAR BID  5   ??? JANUMET 50-1,000 mg per tablet TK 1 T PO BID. STOP TAKING METFORMIN.  0   ??? pregabalin (LYRICA) 75 mg capsule Take 1 Cap by mouth three (3) times daily. Max Daily Amount: 225 mg. 90 Cap 1   ??? azelastine (ASTELIN) 137 mcg (0.1 %) nasal spray 1 Spray by Both Nostrils route two (2) times a day.  4   ??? DULoxetine (CYMBALTA) 30 mg capsule Take 1 Cap by mouth daily.  0   ??? fluticasone (FLONASE) 50 mcg/actuation nasal spray 2 Sprays by Both Nostrils route nightly.  0   ??? hydroCHLOROthiazide (HYDRODIURIL) 25 mg tablet Take 1 Tab by mouth daily.  0   ??? mirtazapine (REMERON) 30 mg tablet Take 1 Tab by mouth daily.  0   ??? montelukast (SINGULAIR) 10 mg tablet Take 1 Tab by mouth daily.  1   ??? omeprazole (PRILOSEC) 20 mg capsule Take 1 Cap by mouth daily.  2   ??? oxybutynin chloride XL (DITROPAN XL) 10 mg CR tablet Take 1 Tab by mouth daily.  2   ??? pravastatin (PRAVACHOL) 40 mg tablet Take 1 Tab by mouth daily.  3   ??? cyclobenzaprine (FLEXERIL) 5 mg tablet Take 1 Tab by mouth nightly. 7 Tab 0   ??? oxyCODONE-acetaminophen (PERCOCET 10) 10-325 mg per tablet Take 1 Tab by mouth every six (6) hours as needed for Pain.     ??? tiotropium bromide (SPIRIVA RESPIMAT) 1.25 mcg/actuation inhaler Take 2 Puffs by inhalation daily.     ??? meloxicam (MOBIC) 15 mg tablet Take 1 Tab by mouth daily. 30 Tab 0   ??? XARELTO 20 mg tab tablet Take 1 Tab by mouth daily.  4   ??? zolpidem (AMBIEN) 10 mg tablet Take 1 Tab by mouth nightly.  0        ALLERGIES  Allergies   Allergen Reactions   ??? 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   ??? Almond Other (comments)     Tongue itches   ??? Carvedilol Other (comments)   ??? Cozaar [Losartan] Cough   ??? Crestor [Rosuvastatin] Unknown (comments)     Heart beats fast          SOCIAL HISTORY    Social History     Social History   ??? Marital status: SINGLE     Spouse name: N/A    ??? Number of children: N/A   ??? Years of education: N/A  Occupational History   ??? Not on file.     Social History Main Topics   ??? Smoking status: Never Smoker   ??? Smokeless tobacco: Never Used   ??? Alcohol use Yes      Comment: on occasion   ??? Drug use: No   ??? Sexual activity: Not on file     Other Topics Concern   ??? Not on file     Social History Narrative       FAMILY HISTORY  Family History   Problem Relation Age of Onset   ??? Diabetes Mother    ??? Diabetes Brother    ??? Cancer Sister    ??? Cancer Maternal Grandmother    ??? Cancer Daughter        REVIEW OF SYSTEMS  Review of Systems   Constitutional: Negative for chills, fever and weight loss.   Respiratory: Negative for shortness of breath.    Cardiovascular: Negative for chest pain.   Gastrointestinal: Negative for constipation.        Negative for fecal incontinence   Genitourinary: Negative for dysuria.        Negative for urinary incontinence   Musculoskeletal:        Per HPI   Skin: Negative for rash.   Neurological: Positive for tingling and focal weakness. Negative for dizziness, tremors and headaches.   Endo/Heme/Allergies: Does not bruise/bleed easily.   Psychiatric/Behavioral: The patient does not have insomnia.        PHYSICAL EXAMINATION  Visit Vitals   ??? BP 147/73   ??? Pulse 92   ??? Temp 98.2 ??F (36.8 ??C) (Oral)   ??? Resp 18   ??? Ht 5\' 4"  (1.626 m)   ??? Wt 176 lb (79.8 kg)   ??? SpO2 98%   ??? BMI 30.21 kg/m2         Accompanied by self.      Constitutional:  Well developed, well nourished, in no acute distress.   Psychiatric: Affect and mood are appropriate.   Integumentary: No rashes or abrasions noted on exposed areas.    Cardiovascular/Peripheral Vascular: Intact l pulses. No peripheral edema is noted.  Lymphatic:  No evidence of lymphedema. No cervical lymphadenopathy.     SPINE/MUSCULOSKELETAL EXAM        Lumbar spine:  No rash, ecchymosis, or gross obliquity. No fasciculations. No focal atrophy is noted.   Tenderness to palpation LT L4-5. Tenderness to  palpation of B/L SI joints. No tenderness to palpation at the sciatic notch. Trochanters non tender.      Sensation grossly intact to light touch.      MOTOR:       Hip Flex  Quads Hamstrings Ankle DF EHL Ankle PF   Right +4/5 +4/5 +4/5 +4/5 +4/5 +4/5   Left +4/5 +4/5 +4/5 +4/5 +4/5 +4/5     Straight Leg raise negative.     Ambulation with walker. FWB.    Written by Arther DamesJulia Bowles , ScribeKick, as dictated by Wynelle Beckmanneeta Karlissa Aron, MD.    I, Dr. Wynelle Beckmanneeta Wilmore Holsomback, MD, confirm that all documentation is accurate.      Cynthia Marsh may have a reminder for a "due or due soon" health maintenance. I have asked that she contact her primary care provider for follow-up on this health maintenance.

## 2016-07-28 NOTE — Progress Notes (Signed)
Verbal order entered per Dr. Wilford Corner as documented on blue sheet:  -Lyrica 100 mg 1 tab PO TID  -Toradol 30 mg IM

## 2016-07-28 NOTE — Patient Instructions (Addendum)
Chronic Pain: Care Instructions  Your Care Instructions  Chronic pain is pain that lasts a long time (months or even years) and may or may not have a clear cause. It is different from acute pain, which usually does have a clear cause???like an injury or illness???and gets better over time. Chronic pain:  ?? Lasts over time but may vary from day to day.  ?? Does not go away despite efforts to end it.  ?? May disrupt your sleep and lead to fatigue.  ?? May cause depression or anxiety.  ?? May make your muscles tense, causing more pain.  ?? Can disrupt your work, hobbies, home life, and relationships with friends and family.  Chronic pain is a very real condition. It is not just in your head. Treatment can help and usually includes several methods used together, such as medicines, physical therapy, exercise, and other treatments. Learning how to relax and changing negative thought patterns can also help you cope.  Chronic pain is complex. Taking an active role in your treatment will help you better manage your pain. Tell your doctor if you have trouble dealing with your pain. You may have to try several things before you find what works best for you.  Follow-up care is a key part of your treatment and safety. Be sure to make and go to all appointments, and call your doctor if you are having problems. It???s also a good idea to know your test results and keep a list of the medicines you take.  How can you care for yourself at home?  ?? Pace yourself. Break up large jobs into smaller tasks. Save harder tasks for days when you have less pain, or go back and forth between hard tasks and easier ones. Take rest breaks.  ?? Relax, and reduce stress. Relaxation techniques such as deep breathing or meditation can help.  ?? Keep moving. Gentle, daily exercise can help reduce pain over the long run. Try low- or no-impact exercises such as walking, swimming, and stationary biking. Do stretches to stay flexible.   ?? Try heat, cold packs, and massage.  ?? Get enough sleep. Chronic pain can make you tired and drain your energy. Talk with your doctor if you have trouble sleeping because of pain.  ?? Think positive. Your thoughts can affect your pain level. Do things that you enjoy to distract yourself when you have pain instead of focusing on the pain. See a movie, read a book, listen to music, or spend time with a friend.  ?? If you think you are depressed, talk to your doctor about treatment.  ?? Keep a daily pain diary. Record how your moods, thoughts, sleep patterns, activities, and medicine affect your pain. You may find that your pain is worse during or after certain activities or when you are feeling a certain emotion. Having a record of your pain can help you and your doctor find the best ways to treat your pain.  ?? Take pain medicines exactly as directed.  ?? If the doctor gave you a prescription medicine for pain, take it as prescribed.  ?? If you are not taking a prescription pain medicine, ask your doctor if you can take an over-the-counter medicine.  Reducing constipation caused by pain medicine  ?? Include fruits, vegetables, beans, and whole grains in your diet each day. These foods are high in fiber.  ?? Drink plenty of fluids, enough so that your urine is light yellow or clear like water. If you   have kidney, heart, or liver disease and have to limit fluids, talk with your doctor before you increase the amount of fluids you drink.  ?? If your doctor recommends it, get more exercise. Walking is a good choice. Bit by bit, increase the amount you walk every day. Try for at least 30 minutes on most days of the week.  ?? Schedule time each day for a bowel movement. A daily routine may help. Take your time and do not strain when having a bowel movement.  When should you call for help?  Call your doctor now or seek immediate medical care if:  ?? Your pain gets worse or is out of control.   ?? You feel down or blue, or you do not enjoy things like you once did. You may be depressed, which is common in people with chronic pain. Depression can be treated.  ?? You have vomiting or cramps for more than 2 hours.  Watch closely for changes in your health, and be sure to contact your doctor if:  ?? You cannot sleep because of pain.  ?? You are very worried or anxious about your pain.  ?? You have trouble taking your pain medicine.  ?? You have any concerns about your pain medicine.  ?? You have trouble with bowel movements, such as:  ?? No bowel movement in 3 days.  ?? Blood in the anal area, in your stool, or on the toilet paper.  ?? Diarrhea for more than 24 hours.  Where can you learn more?  Go to http://www.healthwise.net/[employer].  Enter N004 in the search box to learn more about "Chronic Pain: Care Instructions."  Current as of: September 18, 2015  Content Version: 11.3  ?? 2006-2017 [employer]. Care instructions adapted under license by [employer] (which disclaims liability or warranty for this information). If you have questions about a medical condition or this instruction, always ask your healthcare professional. Healthwise, Inc[employer] disclaims any warranty or liability for your use of this information.

## 2016-07-29 ENCOUNTER — Encounter: Primary: Family Medicine

## 2016-07-29 MED ORDER — PREGABALIN 100 MG CAP
100 mg | ORAL_CAPSULE | ORAL | 2 refills | Status: AC
Start: 2016-07-29 — End: ?

## 2016-07-29 NOTE — Addendum Note (Signed)
Addended by: Alphia Moh A on: 07/29/2016 04:19 PM      Modules accepted: Orders

## 2016-07-31 ENCOUNTER — Encounter: Primary: Family Medicine

## 2016-08-01 ENCOUNTER — Institutional Professional Consult (permissible substitution): Admit: 2016-08-01 | Discharge: 2016-08-01 | Payer: MEDICARE | Primary: Family Medicine

## 2016-08-01 ENCOUNTER — Encounter: Primary: Family Medicine

## 2016-08-01 DIAGNOSIS — I10 Essential (primary) hypertension: Secondary | ICD-10-CM

## 2016-08-01 NOTE — Progress Notes (Signed)
Cynthia Marsh is a 65 y.o. female that is here for a blood pressure check. Her current medications are listed below.     Current Outpatient Prescriptions   Medication Sig   ??? OXYGEN-AIR DELIVERY SYSTEMS 2 L by Nasal route daily. bedtime with cpap   ??? budesonide (PULMICORT) 0.5 mg/2 mL nbsp 2 mL by Nebulization route two (2) times a day for 30 days.   ??? dilTIAZem CD (CARDIZEM CD) 120 mg ER capsule Take 1 Cap by mouth daily for 30 days.   ??? cyclobenzaprine (FLEXERIL) 5 mg tablet Take 1 Tab by mouth nightly. (Patient taking differently: Take 10 mg by mouth nightly.)   ??? tiotropium bromide (SPIRIVA RESPIMAT) 1.25 mcg/actuation inhaler Take 2 Puffs by inhalation daily.   ??? FREESTYLE LITE STRIPS strip USE TO CHECK BLOOD SUGAR BID   ??? busPIRone (BUSPAR) 15 mg tablet TK 1 T PO TID   ??? FREESTYLE LANCETS 28 gauge misc CHECK BLOOD SUGAR BID   ??? JANUMET 50-1,000 mg per tablet TK 1 T PO BID. STOP TAKING METFORMIN.   ??? pregabalin (LYRICA) 75 mg capsule Take 1 Cap by mouth three (3) times daily. Max Daily Amount: 225 mg.   ??? XARELTO 20 mg tab tablet Take 1 Tab by mouth daily.   ??? azelastine (ASTELIN) 137 mcg (0.1 %) nasal spray 1 Spray by Both Nostrils route two (2) times a day.   ??? DULoxetine (CYMBALTA) 30 mg capsule Take 1 Cap by mouth daily.   ??? hydroCHLOROthiazide (HYDRODIURIL) 25 mg tablet Take 12.5 mg by mouth daily.   ??? mirtazapine (REMERON) 30 mg tablet Take 1 Tab by mouth daily.   ??? montelukast (SINGULAIR) 10 mg tablet Take 1 Tab by mouth daily.   ??? omeprazole (PRILOSEC) 20 mg capsule Take 1 Cap by mouth daily.   ??? oxybutynin chloride XL (DITROPAN XL) 10 mg CR tablet Take 1 Tab by mouth daily.   ??? zolpidem (AMBIEN) 10 mg tablet Take 1 Tab by mouth nightly.   ??? pregabalin (LYRICA) 100 mg capsule 1 tab PO TID  Indications: pain   ??? oxyCODONE-acetaminophen (PERCOCET 10) 10-325 mg per tablet Take 1 Tab by mouth every six (6) hours as needed for Pain.   ??? meloxicam (MOBIC) 15 mg tablet Take 1 Tab by mouth daily.    ??? ALPRAZolam (XANAX) 0.5 mg tablet TK 1 T PO BID.  MAXIMUM 2 TS D.   ??? fluticasone (FLONASE) 50 mcg/actuation nasal spray 2 Sprays by Both Nostrils route nightly.   ??? pravastatin (PRAVACHOL) 40 mg tablet Take 1 Tab by mouth daily.     No current facility-administered medications for this visit.                Her   Visit Vitals   ??? BP 124/72 (BP 1 Location: Left arm, BP Patient Position: Sitting)   ??? Pulse 94   ??? Wt 79.4 kg (175 lb)   ??? SpO2 93%   ??? BMI 30.04 kg/m2         Patient seen for 2 week BP check after HCTZ was decreased from 25 mg to 12.5 mg on 07/18/16. Patient reports intermittent chest pain since hospitalization for pneumonia earlier this month. She reports the pain is not as intense. She also reports chronic shortness of breath that has improved. Patient states she started using 2 liters of O2 on 07/29/16 with her CPAP every night. I discussed with Byrd Hesselbach NP. Patient to continue current medication regimen and follow up  with Dr. Zoila Shutter as scheduled and as needed Patient informed of instructions, read back and verbalized understanding Beather Arbour, LPN

## 2016-08-01 NOTE — Progress Notes (Signed)
Pt called to say that she had received her oxygen on Friday.

## 2016-08-02 ENCOUNTER — Encounter: Primary: Family Medicine

## 2016-08-02 ENCOUNTER — Encounter: Admit: 2016-08-02 | Discharge: 2016-08-02 | Payer: PRIVATE HEALTH INSURANCE | Primary: Family Medicine

## 2016-08-03 ENCOUNTER — Encounter: Admit: 2016-08-03 | Discharge: 2016-08-03 | Payer: PRIVATE HEALTH INSURANCE | Primary: Family Medicine

## 2016-08-03 ENCOUNTER — Emergency Department: Admit: 2016-08-03 | Payer: PRIVATE HEALTH INSURANCE | Primary: Family Medicine

## 2016-08-03 ENCOUNTER — Encounter: Primary: Family Medicine

## 2016-08-03 ENCOUNTER — Inpatient Hospital Stay
Admit: 2016-08-03 | Discharge: 2016-08-03 | Disposition: A | Discharge status: Home / Self Care | Payer: PRIVATE HEALTH INSURANCE | Attending: Emergency Medicine

## 2016-08-03 DIAGNOSIS — M25552 Pain in left hip: Secondary | ICD-10-CM

## 2016-08-03 LAB — METABOLIC PANEL, COMPREHENSIVE
A-G Ratio: 0.7 — ABNORMAL LOW (ref 0.8–1.7)
ALT (SGPT): 11 U/L — ABNORMAL LOW (ref 13–56)
AST (SGOT): 7 U/L — ABNORMAL LOW (ref 15–37)
Albumin: 3.2 g/dL — ABNORMAL LOW (ref 3.4–5.0)
Alk. phosphatase: 69 U/L (ref 45–117)
Anion gap: 5 mmol/L (ref 3.0–18)
BUN/Creatinine ratio: 21 — ABNORMAL HIGH (ref 12–20)
BUN: 16 MG/DL (ref 7.0–18)
Bilirubin, total: 0.2 MG/DL (ref 0.2–1.0)
CO2: 30 mmol/L (ref 21–32)
Calcium: 9.4 MG/DL (ref 8.5–10.1)
Chloride: 101 mmol/L (ref 100–108)
Creatinine: 0.77 MG/DL (ref 0.6–1.3)
GFR est AA: >60 mL/min/{1.73_m2} (ref >60)
GFR est non-AA: >60 mL/min/{1.73_m2} (ref >60)
Globulin: 4.4 g/dL — ABNORMAL HIGH (ref 2.0–4.0)
Glucose: 96 mg/dL (ref 74–99)
Potassium: 3.5 mmol/L (ref 3.5–5.5)
Protein, total: 7.6 g/dL (ref 6.4–8.2)
Sodium: 136 mmol/L (ref 136–145)

## 2016-08-03 LAB — CBC WITH AUTOMATED DIFF
ABS. BASOPHILS: 0 10*3/uL (ref 0.0–0.06)
ABS. EOSINOPHILS: 0.2 10*3/uL (ref 0.0–0.4)
ABS. LYMPHOCYTES: 1.5 10*3/uL (ref 0.9–3.6)
ABS. MONOCYTES: 0.6 10*3/uL (ref 0.05–1.2)
ABS. NEUTROPHILS: 3.2 10*3/uL (ref 1.8–8.0)
BASOPHILS: 1 % (ref 0–2)
EOSINOPHILS: 3 % (ref 0–5)
HCT: 32.7 % — ABNORMAL LOW (ref 35.0–45.0)
HGB: 10 g/dL — ABNORMAL LOW (ref 12.0–16.0)
LYMPHOCYTES: 28 % (ref 21–52)
MCH: 24.9 PG (ref 24.0–34.0)
MCHC: 30.6 g/dL — ABNORMAL LOW (ref 31.0–37.0)
MCV: 81.5 FL (ref 74.0–97.0)
MONOCYTES: 11 % — ABNORMAL HIGH (ref 3–10)
MPV: 9.2 FL (ref 9.2–11.8)
NEUTROPHILS: 57 % (ref 40–73)
PLATELET: 337 10*3/uL (ref 135–420)
RBC: 4.01 M/uL — ABNORMAL LOW (ref 4.20–5.30)
RDW: 15.6 % — ABNORMAL HIGH (ref 11.6–14.5)
WBC: 5.5 10*3/uL (ref 4.6–13.2)

## 2016-08-03 LAB — URINALYSIS W/ RFLX MICROSCOPIC
Bilirubin: NEGATIVE
Blood: NEGATIVE
Glucose: NEGATIVE mg/dL
Ketone: NEGATIVE mg/dL
Leukocyte Esterase: NEGATIVE
Nitrites: NEGATIVE
Protein: NEGATIVE mg/dL
Specific gravity: 1.014 (ref 1.005–1.030)
Urobilinogen: 0.2 EU/dL (ref 0.2–1.0)
pH (UA): 7.5 (ref 5.0–8.0)

## 2016-08-03 MED ORDER — OXYCODONE-ACETAMINOPHEN 5 MG-325 MG TAB
5-325 mg | ORAL_TABLET | Freq: Four times a day (QID) | ORAL | 0 refills | Status: DC | PRN
Start: 2016-08-03 — End: 2016-08-15

## 2016-08-03 MED ORDER — OXYCODONE-ACETAMINOPHEN 5 MG-325 MG TAB
5-325 mg | ORAL | Status: AC
Start: 2016-08-03 — End: 2016-08-03
  Administered 2016-08-03: 18:00:00 via ORAL

## 2016-08-03 MED ORDER — MORPHINE 2 MG/ML INJECTION
2 mg/mL | INTRAMUSCULAR | Status: AC
Start: 2016-08-03 — End: 2016-08-03
  Administered 2016-08-03: 16:00:00 via INTRAVENOUS

## 2016-08-03 MED FILL — OXYCODONE-ACETAMINOPHEN 5 MG-325 MG TAB: 5-325 mg | ORAL | Qty: 1

## 2016-08-03 MED FILL — MORPHINE 2 MG/ML INJECTION: 2 mg/mL | INTRAMUSCULAR | Qty: 1

## 2016-08-03 NOTE — ED Triage Notes (Signed)
Patient complains of left and right hip pain at this time, patient states it radiates to the lower abd area. Patient states it hurts worst when she walks . Patient is on cell phone at this time. Pain sx 1 day

## 2016-08-03 NOTE — ED Notes (Signed)
Patient armband removed and shredded  I have reviewed discharge instructions with the patient.  The patient verbalized understanding.

## 2016-08-03 NOTE — ED Provider Notes (Addendum)
HPI Comments: 65 yr old female, hx asthma, htn, DM, CKD, depression, and thromboembolism presents to the ED complaining of bilateral hip pain worse with ambulation that she states is "stretching across the lower stomach area" worsening over the past few days. Pt states she was in pain management for her chronic back pain but states she is no longer in pain management. Denies any known injury. No other complaints.     Patient is a 65 y.o. female presenting with hip pain and abdominal pain.   Hip Injury    Associated symptoms include back pain. Pertinent negatives include no neck pain.   Abdominal Pain    Associated symptoms include arthralgias and back pain. Pertinent negatives include no fever, no diarrhea, no nausea, no vomiting, no constipation, no dysuria, no frequency, no hematuria, no headaches, no myalgias and no chest pain.        Past Medical History:   Diagnosis Date   ??? Anxiety    ??? Asthma    ??? 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 Cardiac echocardiogram 11/07/2015    EF 60%.  No RWMA.  Gr 1 DDfx.  RVSP 30 mmHg.     ??? Cardiovascular lower extremity venous duplex 11/06/2015    No DVT bilaterally.   ??? Chronic kidney disease    ??? Depression    ??? Developmental delay    ??? Diabetes (HCC)    ??? Diabetes mellitus (HCC)    ??? History of nuclear stress test 03/2016    No ischemia, no infarct, EF 67%.  Low risk study   ??? Hypertension    ??? Insomnia    ??? Osteoporosis    ??? Pneumonia 2010   ??? Thromboembolus [facility])        Past Surgical History:   Procedure Laterality Date   ??? BREAST SURGERY PROCEDURE UNLISTED       Left cyst removed    ??? HAND/FINGER SURGERY UNLISTED     ??? HX BACK SURGERY  2012    L4/L5/S1 fused in [place]   ??? HX CYST INCISION AND DRAINAGE Left    ??? HX CYST REMOVAL      calf    ??? HX GYN      complete hyst    ??? HX GYN      tubal ligation   ??? HX HYSTERECTOMY     ??? HX KNEE ARTHROSCOPY     ??? HX OOPHORECTOMY     ??? HX ORTHOPAEDIC      Right shoulder rotator cuff    ??? HX ORTHOPAEDIC       Left Little toe hammer toe, left big toe bunion removed   ??? HX ORTHOPAEDIC      Right knee torn meniscus   ??? HX ORTHOPAEDIC      CTS bilateral , Left thumb trigger finger    ??? HX WRIST FRACTURE TX     ??? LAMINECTOMY,CERVICAL     ??? PR ANESTH,SURGERY OF SHOULDER           Family History:   Problem Relation Age of Onset   ??? Diabetes Mother    ??? Diabetes Brother    ??? Cancer Sister    ??? Cancer Maternal Grandmother    ??? Cancer Daughter        Social History     Social History   ??? Marital status: SINGLE     Spouse name: N/A   ??? Number of children: N/A   ???  Years of education: N/A     Occupational History   ??? Not on file.     Social History Main Topics   ??? Smoking status: Never Smoker   ??? Smokeless tobacco: Never Used   ??? Alcohol use Yes      Comment: on occasion   ??? Drug use: No   ??? Sexual activity: Not on file     Other Topics Concern   ??? Not on file     Social History Narrative         ALLERGIES: Nuts [tree nut]; Almond; Carvedilol; Cozaar [losartan]; and Crestor [rosuvastatin]    Review of Systems   Constitutional: Negative.  Negative for chills, diaphoresis, fatigue and fever.   HENT: Negative.  Negative for congestion, ear pain, rhinorrhea and sore throat.    Eyes: Negative.  Negative for pain and redness.   Respiratory: Negative.  Negative for cough, shortness of breath, wheezing and stridor.    Cardiovascular: Negative.  Negative for chest pain, palpitations and leg swelling.   Gastrointestinal: Positive for abdominal pain. Negative for constipation, diarrhea, nausea and vomiting.   Endocrine: Negative.    Genitourinary: Negative.  Negative for dysuria, flank pain, frequency and hematuria.   Musculoskeletal: Positive for arthralgias and back pain. Negative for myalgias, neck pain and neck stiffness.        Hip pain, chronic back pain   Skin: Negative.  Negative for rash and wound.   Allergic/Immunologic: Negative.    Neurological: Negative.  Negative for dizziness, seizures, syncope and headaches.    Hematological: Negative.    Psychiatric/Behavioral: Negative.    All other systems reviewed and are negative.      Vitals:    08/03/16 1045   BP: 100/70   Pulse: 96   Resp: 20   Temp: 98.4 ??F (36.9 ??C)   SpO2: 95%   Weight: 78.9 kg (174 lb)            Physical Exam   Constitutional: She is oriented to person, place, and time. She appears well-developed and well-nourished. She appears distressed.   Mildly distressed   HENT:   Head: Normocephalic.   Neck: Normal range of motion. Neck supple.   Cardiovascular: Normal rate, regular rhythm and normal heart sounds.  Exam reveals no gallop and no friction rub.    No murmur heard.  Pulmonary/Chest: Effort normal and breath sounds normal. No stridor. No respiratory distress. She has no wheezes. She has no rales.   Abdominal: Soft. Bowel sounds are normal. She exhibits no distension and no mass. There is no tenderness. There is no rebound and no guarding.   No abdominal TTP noted   Musculoskeletal: Normal range of motion. She exhibits tenderness. She exhibits no edema or deformity.   Mild TTP noted over the bilateral hips no radicular or referred pain elicited    Neurological: She is alert and oriented to person, place, and time. Coordination normal.   Skin: Skin is warm and dry. No rash noted. She is not diaphoretic. No erythema.   Psychiatric: She has a normal mood and affect. Her behavior is normal. Thought content normal.   Nursing note and vitals reviewed.       MDM  Number of Diagnoses or Management Options  Diagnosis management comments: Impression:  Hip pain    2 mg morphine given    RBC 4.01, hgb 10, hct 32.7, (pt chronically anemic), MCHC 30.6, RDW 15.6, monos 11, BUCR 21, GPT 11, SGOT 7, ALB 3.2, GLOB  4.4, AGRAT 0.7  UA unremarkable    X-ray hips bil: Negative bilateral hips..    Patient is stable for discharge at this time. Rx for very short course of percocet given. Rest and follow-up with PCP this week. Return to the ED  immediately for any new or worsening sx.  Toneisha Savary J Verne Cove, PA-C 1:35 PM        Amount and/or Complexity of Data Reviewed  Clinical lab tests: reviewed and ordered  Tests in the radiology section of CPT??: ordered and reviewed    Risk of Complications, Morbidity, and/or Mortality  Presenting problems: moderate  Diagnostic procedures: moderate  Management options: moderate    Patient Progress  Patient progress: stable    ED Course       Procedures

## 2016-08-04 ENCOUNTER — Encounter: Primary: Family Medicine

## 2016-08-04 NOTE — Telephone Encounter (Signed)
No, we will not be refilling this medication.     Per Dr. Bess Harvest note,   Increase Lyrica to 100 mg TID. Discussed target range of 300-450mg /day  2. Continue Flexeril.   3. Given care information for chronic pain.   4. Pt does not wish to go to Pain Management.     We can place a PM referral.

## 2016-08-04 NOTE — Telephone Encounter (Signed)
Pt is calling stating that she went to [facility] ER yesterday and was given Oxycodone 5-325mg  Qty 10 for her severe back pain. Pt wants to know will Dr. Wilford Corner refill it for her when she runs out. Please advise [phone].

## 2016-08-05 NOTE — Telephone Encounter (Signed)
Spoke with patient, informed of NP Buttery message below. Patient stated that is not working, and declined PM referral due to moving back to [place] soon. Patient ended the call.

## 2016-08-09 ENCOUNTER — Encounter: Primary: Family Medicine

## 2016-08-10 ENCOUNTER — Encounter: Admit: 2016-08-10 | Discharge: 2016-08-10 | Payer: PRIVATE HEALTH INSURANCE | Primary: Family Medicine

## 2016-08-11 ENCOUNTER — Encounter: Primary: Family Medicine

## 2016-08-12 ENCOUNTER — Encounter: Admit: 2016-08-12 | Discharge: 2016-08-12 | Payer: PRIVATE HEALTH INSURANCE | Primary: Family Medicine

## 2016-08-15 ENCOUNTER — Encounter: Admit: 2016-08-15 | Discharge: 2016-08-15 | Payer: PRIVATE HEALTH INSURANCE | Primary: Family Medicine

## 2016-08-15 ENCOUNTER — Inpatient Hospital Stay
Admit: 2016-08-15 | Discharge: 2016-08-15 | Disposition: A | Discharge status: Home / Self Care | Payer: PRIVATE HEALTH INSURANCE | Attending: Emergency Medicine

## 2016-08-15 DIAGNOSIS — T23011D Burn of unspecified degree of right thumb (nail), subsequent encounter: Secondary | ICD-10-CM

## 2016-08-15 NOTE — ED Triage Notes (Signed)
Pt presents to the ED with right 1st finger onset x2 weeks. Pt states grease "popped" onto 1st right finger, states "I saw my blister came off," x1 week ago. Pt states pain. Pt reports 6/10.

## 2016-08-15 NOTE — ED Provider Notes (Signed)
[place]  [facility]      65 y.o. female with noted PMH presents to the ED c/o a wound to her right thumb 2 weeks ago.  Pt states she had a drop of grease burn this finger 2 weeks ago.  Says she had a blister and then 1 week ago the blister came off.  Says she has some pain to this site and wanted to be checked out.  Last tetanus shot was 2013.  Denies numbness, weakness, other burn, other symptoms.     No current facility-administered medications for this encounter.      Current Outpatient Prescriptions   Medication Sig   ??? pregabalin (LYRICA) 100 mg capsule 1 tab PO TID  Indications: pain   ??? OXYGEN-AIR DELIVERY SYSTEMS 2 L by Nasal route daily. bedtime with cpap   ??? cyclobenzaprine (FLEXERIL) 5 mg tablet Take 1 Tab by mouth nightly. (Patient taking differently: Take 10 mg by mouth nightly.)   ??? tiotropium bromide (SPIRIVA RESPIMAT) 1.25 mcg/actuation inhaler Take 2 Puffs by inhalation daily.   ??? busPIRone (BUSPAR) 15 mg tablet TK 1 T PO TID   ??? JANUMET 50-1,000 mg per tablet TK 1 T PO BID. STOP TAKING METFORMIN.   ??? pregabalin (LYRICA) 75 mg capsule Take 1 Cap by mouth three (3) times daily. Max Daily Amount: 225 mg.   ??? azelastine (ASTELIN) 137 mcg (0.1 %) nasal spray 1 Spray by Both Nostrils route two (2) times a day.   ??? DULoxetine (CYMBALTA) 30 mg capsule Take 1 Cap by mouth daily.   ??? hydroCHLOROthiazide (HYDRODIURIL) 25 mg tablet Take 12.5 mg by mouth daily.   ??? mirtazapine (REMERON) 30 mg tablet Take 1 Tab by mouth daily.   ??? montelukast (SINGULAIR) 10 mg tablet Take 1 Tab by mouth daily.   ??? omeprazole (PRILOSEC) 20 mg capsule Take 1 Cap by mouth daily.   ??? oxybutynin chloride XL (DITROPAN XL) 10 mg CR tablet Take 1 Tab by mouth daily.   ??? zolpidem (AMBIEN) 10 mg tablet Take 1 Tab by mouth nightly.   ??? FREESTYLE LITE STRIPS strip USE TO CHECK BLOOD SUGAR BID   ??? FREESTYLE LANCETS 28 gauge misc CHECK BLOOD SUGAR BID       Past Medical History:   Diagnosis Date   ??? Anxiety    ??? Asthma     ??? 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 Cardiac echocardiogram 11/07/2015    EF 60%.  No RWMA.  Gr 1 DDfx.  RVSP 30 mmHg.     ??? Cardiovascular lower extremity venous duplex 11/06/2015    No DVT bilaterally.   ??? Chronic kidney disease    ??? Depression    ??? Developmental delay    ??? Diabetes (HCC)    ??? Diabetes mellitus (HCC)    ??? History of nuclear stress test 03/2016    No ischemia, no infarct, EF 67%.  Low risk study   ??? Hypertension    ??? Insomnia    ??? Osteoporosis    ??? Pneumonia 2010   ??? Thromboembolus [facility])        Past Surgical History:   Procedure Laterality Date   ??? BREAST SURGERY PROCEDURE UNLISTED       Left cyst removed    ??? HAND/FINGER SURGERY UNLISTED     ??? HX BACK SURGERY  2012    L4/L5/S1 fused in [place]   ??? HX CYST INCISION AND DRAINAGE Left    ???  HX CYST REMOVAL      calf    ??? HX GYN      complete hyst    ??? HX GYN      tubal ligation   ??? HX HYSTERECTOMY     ??? HX KNEE ARTHROSCOPY     ??? HX OOPHORECTOMY     ??? HX ORTHOPAEDIC      Right shoulder rotator cuff    ??? HX ORTHOPAEDIC      Left Little toe hammer toe, left big toe bunion removed   ??? HX ORTHOPAEDIC      Right knee torn meniscus   ??? HX ORTHOPAEDIC      CTS bilateral , Left thumb trigger finger    ??? HX WRIST FRACTURE TX     ??? LAMINECTOMY,CERVICAL     ??? PR ANESTH,SURGERY OF SHOULDER         Family History   Problem Relation Age of Onset   ??? Diabetes Mother    ??? Diabetes Brother    ??? Cancer Sister    ??? Cancer Maternal Grandmother    ??? Cancer Daughter        Social History     Social History   ??? Marital status: SINGLE     Spouse name: N/A   ??? Number of children: N/A   ??? Years of education: N/A     Occupational History   ??? Not on file.     Social History Main Topics   ??? Smoking status: Never Smoker   ??? Smokeless tobacco: Never Used   ??? Alcohol use Yes      Comment: on occasion   ??? Drug use: No   ??? Sexual activity: Not on file     Other Topics Concern   ??? Not on file     Social History Narrative       Allergies   Allergen Reactions    ??? 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   ??? Almond Other (comments)     Tongue itches   ??? Carvedilol Other (comments)   ??? Cozaar [Losartan] Cough   ??? Crestor [Rosuvastatin] Unknown (comments)     Heart beats fast       Patient's primary care provider (as noted in EPIC):  Lenn Sink, MD    Constitutional:  Denies malaise, fever, chills.   Extremity/MS:  + pain right thumb  Neuro:  Denies headache neurologic symptoms/deficits/paresthesias.   Skin: + wound check right thumb.  All other systems negative as reviewed.     Visit Vitals   ??? BP 116/82 (BP 1 Location: Left arm, BP Patient Position: Sitting)   ??? Pulse 95   ??? Temp 98.5 ??F (36.9 ??C)   ??? Resp 18   ??? Ht 5\' 4"  (1.626 m)   ??? Wt 78.9 kg (174 lb)   ??? SpO2 97%   ??? BMI 29.87 kg/m2       PHYSICAL EXAM:    CONSTITUTIONAL:  Alert, in no apparent distress;  well developed;  well nourished.  HEAD:  Normocephalic, atraumatic.  EYES:  EOMI.  Non-icteric sclera.  Normal conjunctiva.  ENTM:  Mouth: mucous membranes moist.  NECK:  Supple  RESPIRATORY:  Chest clear, equal breath sounds, good air movement. Without wheezes, rhonchi or rales.    CARDIOVASCULAR:  Regular rate and rhythm.  No murmurs, rubs, or gallops.  UPPER EXT:  See skin.  NVI distally with full ROM and 5/5  strength.   NEURO:  Moves all four extremities, and grossly normal motor exam.  SKIN: Right thumb dorsal aspect with 1cm region of granulation tissue with adjacent superficial scab present; without surrounding erythema/edema/warmth  PSYCH:  Alert and normal affect.    ED COURSE:      Pt had a burn to her right thumb 2 weeks ago.  Blister popped 1 week ago. She is worried because the blister skin is now peeling off.  The wound looks great, there is very well healed granulation tissue underlying the region.  Last tetanus was 2013.  Will discharge home.     Diagnosis:   1. Burn    2. Visit for wound check      Disposition: Discharge    Follow-up Information      Follow up With Details Comments Contact Info    Lenn SinkSamir Abdelshaheed, MD  As needed [address]  [facility]  [place] [id number]  [phone]      [facility]  If symptoms worsen [address]  [place] [id number]  [phone]          Patient's Medications   Start Taking    No medications on file   Continue Taking    AZELASTINE (ASTELIN) 137 MCG (0.1 %) NASAL SPRAY    1 Spray by Both Nostrils route two (2) times a day.    BUSPIRONE (BUSPAR) 15 MG TABLET    TK 1 T PO TID    CYCLOBENZAPRINE (FLEXERIL) 5 MG TABLET    Take 1 Tab by mouth nightly.    DULOXETINE (CYMBALTA) 30 MG CAPSULE    Take 1 Cap by mouth daily.    FREESTYLE LANCETS 28 GAUGE MISC    CHECK BLOOD SUGAR BID    FREESTYLE LITE STRIPS STRIP    USE TO CHECK BLOOD SUGAR BID    HYDROCHLOROTHIAZIDE (HYDRODIURIL) 25 MG TABLET    Take 12.5 mg by mouth daily.    JANUMET 50-1,000 MG PER TABLET    TK 1 T PO BID. STOP TAKING METFORMIN.    MIRTAZAPINE (REMERON) 30 MG TABLET    Take 1 Tab by mouth daily.    MONTELUKAST (SINGULAIR) 10 MG TABLET    Take 1 Tab by mouth daily.    OMEPRAZOLE (PRILOSEC) 20 MG CAPSULE    Take 1 Cap by mouth daily.    OXYBUTYNIN CHLORIDE XL (DITROPAN XL) 10 MG CR TABLET    Take 1 Tab by mouth daily.    OXYGEN-AIR DELIVERY SYSTEMS    2 L by Nasal route daily. bedtime with cpap    PREGABALIN (LYRICA) 100 MG CAPSULE    1 tab PO TID  Indications: pain    PREGABALIN (LYRICA) 75 MG CAPSULE    Take 1 Cap by mouth three (3) times daily. Max Daily Amount: 225 mg.    TIOTROPIUM BROMIDE (SPIRIVA RESPIMAT) 1.25 MCG/ACTUATION INHALER    Take 2 Puffs by inhalation daily.    ZOLPIDEM (AMBIEN) 10 MG TABLET    Take 1 Tab by mouth nightly.   These Medications have changed    No medications on file   Stop Taking    ALPRAZOLAM (XANAX) 0.5 MG TABLET    TK 1 T PO BID.  MAXIMUM 2 TS D.    FLUTICASONE (FLONASE) 50 MCG/ACTUATION NASAL SPRAY    2 Sprays by Both Nostrils route nightly.     MELOXICAM (MOBIC) 15 MG TABLET    Take 1 Tab by mouth daily.  OXYCODONE-ACETAMINOPHEN (PERCOCET) 5-325 MG PER TABLET    Take 1 Tab by mouth every six (6) hours as needed for Pain. Max Daily Amount: 4 Tabs.    PRAVASTATIN (PRAVACHOL) 40 MG TABLET    Take 1 Tab by mouth daily.    XARELTO 20 MG TAB TABLET    Take 1 Tab by mouth daily.     Nicolette Bang, PA

## 2016-08-15 NOTE — ED Notes (Signed)
Pt states ready for discharge. Pt states she will follow up with PCP as instructed by provider. Pt appears in NOAD.    I have reviewed discharge instructions with the patient. The patient verbalized understanding. Patient seen leaving ED ambulatory without difficulty or need for assistance, with S/O in no sign of distress. Patient armband removed and shredded.  .  Current Discharge Medication List

## 2016-08-16 ENCOUNTER — Ambulatory Visit: Admit: 2016-08-16 | Discharge: 2016-08-16 | Payer: MEDICARE | Attending: Pulmonary Disease | Primary: Family Medicine

## 2016-08-16 DIAGNOSIS — I2699 Other pulmonary embolism without acute cor pulmonale: Secondary | ICD-10-CM

## 2016-08-16 NOTE — Progress Notes (Signed)
Cynthia Marsh has a reminder for a "due or due soon" health maintenance. I have asked that she contact her primary care provider for follow-up on this health maintenance.

## 2016-08-16 NOTE — Progress Notes (Addendum)
[facility]  Pulmonary, Critical Care, and Sleep Medicine      Pulmonary Office -TOC    Name: Cynthia Marsh     DOB: Apr 18, 1951     Date: 08/16/2016        Subjective:   08/16/16    Patient is a 65 y.o. female is here for   Chief Complaint   Patient presents with   ??? Hospital Follow Up      She was recently hospitalized for pneumonia (07/2016) treated Zosyn and Vancomycin.  Had CT chest and CXR showed right sided pneumonia. Also had prior ED visit for pneumonia ( 06/2016) given px for Levaquin and was sent home. She was last seen in the clinic 05/2016 for pulmonary clearance for esophageal dilatation and UGI .  She was seen by  Dr Domingo Cocking (03/2016)  for pulmonary embolus-on Xarelto, Duplex LE was negative, she was kept on Xarelto until she had cortisone injection on her arthritic knee.  She report seen by hematologist-Xarelto was d/c over a wk ago.  She report moving back to [place] to be with daughter who just had a baby.  She will kept her appt with Dr Timmothy Sours for sleep apnea and CPAP on 08/19/2016.      She report intermittent SOB usually with exertion, no nocturnal symptoms.  She report occasional dry cough, no mucus or hemoptysis.  She denies chest pain or any ankle swelling.  She report chronic lower back pain     Past Medical History:   Diagnosis Date   ??? Anxiety    ??? Asthma    ??? 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 Cardiac echocardiogram 11/07/2015    EF 60%.  No RWMA.  Gr 1 DDfx.  RVSP 30 mmHg.     ??? Cardiovascular lower extremity venous duplex 11/06/2015    No DVT bilaterally.   ??? Chronic kidney disease    ??? Depression    ??? Developmental delay    ??? Diabetes ([place])    ??? Diabetes mellitus ([place])    ??? History of nuclear stress test 03/2016    No ischemia, no infarct, EF 67%.  Low risk study   ??? Hypertension    ??? Insomnia    ??? Osteoporosis    ??? Pneumonia 2010   ??? Thromboembolus [facility])      Social History     Social History   ??? Marital status: SINGLE     Spouse name: N/A    ??? Number of children: N/A   ??? Years of education: N/A     Occupational History   ??? Not on file.     Social History Main Topics   ??? Smoking status: Never Smoker   ??? Smokeless tobacco: Never Used   ??? Alcohol use Yes      Comment: on occasion   ??? Drug use: No   ??? Sexual activity: Not on file     Other Topics Concern   ??? Not on file     Social History Narrative      Past Surgical History:   Procedure Laterality Date   ??? BREAST SURGERY PROCEDURE UNLISTED       Left cyst removed    ??? HAND/FINGER SURGERY UNLISTED     ??? HX BACK SURGERY  2012    L4/L5/S1 fused in [place]   ??? HX CYST INCISION AND DRAINAGE Left    ??? HX CYST REMOVAL      calf    ???  HX GYN      complete hyst    ??? HX GYN      tubal ligation   ??? HX HYSTERECTOMY     ??? HX KNEE ARTHROSCOPY     ??? HX OOPHORECTOMY     ??? HX ORTHOPAEDIC      Right shoulder rotator cuff    ??? HX ORTHOPAEDIC      Left Little toe hammer toe, left big toe bunion removed   ??? HX ORTHOPAEDIC      Right knee torn meniscus   ??? HX ORTHOPAEDIC      CTS bilateral , Left thumb trigger finger    ??? HX WRIST FRACTURE TX     ??? LAMINECTOMY,CERVICAL     ??? PR ANESTH,SURGERY OF SHOULDER          Allergies   Allergen Reactions   ??? 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   ??? Almond Other (comments)     Tongue itches   ??? Carvedilol Other (comments)   ??? Cozaar [Losartan] Cough   ??? Crestor [Rosuvastatin] Unknown (comments)     Heart beats fast       Current Outpatient Prescriptions   Medication Sig Dispense Refill   ??? pioglitazone (ACTOS) 15 mg tablet Take  by mouth.     ??? dilTIAZem CD (CARTIA XT) 120 mg ER capsule Take  by mouth daily.     ??? pregabalin (LYRICA) 100 mg capsule 1 tab PO TID  Indications: pain 90 Cap 2   ??? OXYGEN-AIR DELIVERY SYSTEMS 2 L by Nasal route daily. bedtime with cpap     ??? cyclobenzaprine (FLEXERIL) 5 mg tablet Take 1 Tab by mouth nightly. (Patient taking differently: Take 10 mg by mouth nightly.) 7 Tab 0    ??? tiotropium bromide (SPIRIVA RESPIMAT) 1.25 mcg/actuation inhaler Take 2 Puffs by inhalation daily.     ??? FREESTYLE LITE STRIPS strip USE TO CHECK BLOOD SUGAR BID  5   ??? busPIRone (BUSPAR) 15 mg tablet TK 1 T PO TID  5   ??? FREESTYLE LANCETS 28 gauge misc CHECK BLOOD SUGAR BID  5   ??? JANUMET 50-1,000 mg per tablet TK 1 T PO BID. STOP TAKING METFORMIN.  0   ??? azelastine (ASTELIN) 137 mcg (0.1 %) nasal spray 1 Spray by Both Nostrils route two (2) times a day.  4   ??? DULoxetine (CYMBALTA) 30 mg capsule Take 1 Cap by mouth daily.  0   ??? hydroCHLOROthiazide (HYDRODIURIL) 25 mg tablet Take 12.5 mg by mouth daily.  0   ??? mirtazapine (REMERON) 30 mg tablet Take 1 Tab by mouth daily.  0   ??? montelukast (SINGULAIR) 10 mg tablet Take 1 Tab by mouth daily.  1   ??? omeprazole (PRILOSEC) 20 mg capsule Take 1 Cap by mouth daily.  2   ??? oxybutynin chloride XL (DITROPAN XL) 10 mg CR tablet Take 1 Tab by mouth daily.  2   ??? zolpidem (AMBIEN) 10 mg tablet Take 1 Tab by mouth nightly.  0   ??? pregabalin (LYRICA) 75 mg capsule Take 1 Cap by mouth three (3) times daily. Max Daily Amount: 225 mg. 90 Cap 1     Patient Active Problem List   Diagnosis Code   ??? Depression F32.9   ??? Anxiety F41.9   ??? Insomnia G47.00   ??? Mild intermittent asthma without complication H47.42   ??? Chronic pain of right knee M25.561,  G89.29   ??? Type 2 diabetes mellitus without complication (HCC) G31.5   ??? Essential hypertension with goal blood pressure less than 140/90 I10   ??? Intractable migraine without aura and without status migrainosus G43.019   ??? Hx of spinal fusion Z98.1   ??? Atypical chest pain R07.89   ??? Acute pulmonary embolism (HCC) I26.99   ??? Pulmonary emboli (HCC) I26.99   ??? Hypokalemia E87.6   ??? Ataxia R27.0   ??? Altered mental status R41.82   ??? Encephalopathy acute G93.40   ??? History of lumbar fusion, L4-sacrum Z98.890   ??? Encounter for long-term (current) use of medications Z79.899   ??? Post laminectomy syndrome M96.1    ??? 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 History of depression Z86.59   ??? History of anxiety Z86.59   ??? 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 Essential hypertension I10   ??? Dyslipidemia E78.5   ??? Type 2 diabetes mellitus without complication, without long-term current use of insulin (HCC) E11.9   ??? Community acquired pneumonia J18.9   ??? Rhabdomyolysis M62.82   ??? Pneumonia J18.9        Review of Systems:  Constitutional: No fever, no chills, no weight loss, no night sweats   HEENT: No epistaxis, no nasal drainage, no difficulty in swallowing, no redness in eyes  Respiratory: as above  Cardiovascular: no chest pain, no palpitations, no chronic leg edema, no syncope  Gastrointestinal: no abd pain, no vomiting, no diarrhea, no bleeding symptoms  Genitourinary: No urinary symptoms or hematuria  Integument/breast: No ulcers or rashes  Musculoskeletal:Neg  Neurological: No focal weakness, no seizures, no headaches  Behvioral/Psych: No anxiety, no depression     Objective:     Visit Vitals   ??? BP 112/74 (BP 1 Location: Left arm, BP Patient Position: Sitting)   ??? Pulse 93   ??? Temp 98.6 ??F (37 ??C) (Oral)   ??? Resp 16   ??? Ht '5\' 4"'$  (1.626 m)   ??? Wt 81.6 kg (180 lb)   ??? SpO2 97%  Comment: RA Rest   ??? BMI 30.9 kg/m2        Physical Exam:   General: Well developed female, appear comfortable, no acute distress  HEENT: Pupils reactive, sclera anicteric, EOM intact  Neck: No adenopathy or thyroid swelling, no JVD, supple  CVS: S1S2, no murmurs  RS: Mod AE bilaterally, no tactile fremitus or egophony, no accessory muscle use, lungs clear bilaterally, no wheezing.  Abd: Soft, non tender, no hepatosplenomegaly  Neuro: Non focal, awake, alert  Extrm: No leg edema, clubbing or cyanosis  Skin: No rash, warm and dry    Data review:     Admission on 08/03/2016, Discharged on 08/03/2016   Component Date Value Ref Range Status   ??? WBC 08/03/2016 5.5  4.6 - 13.2 K/uL Final    ??? RBC 08/03/2016 4.01* 4.20 - 5.30 M/uL Final   ??? HGB 08/03/2016 10.0* 12.0 - 16.0 g/dL Final   ??? HCT 08/03/2016 32.7* 35.0 - 45.0 % Final   ??? MCV 08/03/2016 81.5  74.0 - 97.0 FL Final   ??? MCH 08/03/2016 24.9  24.0 - 34.0 PG Final   ??? MCHC 08/03/2016 30.6* 31.0 - 37.0 g/dL Final   ??? RDW 08/03/2016 15.6* 11.6 - 14.5 % Final   ??? PLATELET 08/03/2016 337  135 - 420 K/uL Final   ??? MPV 08/03/2016 9.2  9.2 - 11.8 FL Final   ???  NEUTROPHILS 08/03/2016 57  40 - 73 % Final   ??? LYMPHOCYTES 08/03/2016 28  21 - 52 % Final   ??? MONOCYTES 08/03/2016 11* 3 - 10 % Final   ??? EOSINOPHILS 08/03/2016 3  0 - 5 % Final   ??? BASOPHILS 08/03/2016 1  0 - 2 % Final   ??? ABS. NEUTROPHILS 08/03/2016 3.2  1.8 - 8.0 K/UL Final   ??? ABS. LYMPHOCYTES 08/03/2016 1.5  0.9 - 3.6 K/UL Final   ??? ABS. MONOCYTES 08/03/2016 0.6  0.05 - 1.2 K/UL Final   ??? ABS. EOSINOPHILS 08/03/2016 0.2  0.0 - 0.4 K/UL Final   ??? ABS. BASOPHILS 08/03/2016 0.0  0.0 - 0.06 K/UL Final   ??? DF 08/03/2016 AUTOMATED    Final   ??? Sodium 08/03/2016 136  136 - 145 mmol/L Final   ??? Potassium 08/03/2016 3.5  3.5 - 5.5 mmol/L Final   ??? Chloride 08/03/2016 101  100 - 108 mmol/L Final   ??? CO2 08/03/2016 30  21 - 32 mmol/L Final   ??? Anion gap 08/03/2016 5  3.0 - 18 mmol/L Final   ??? Glucose 08/03/2016 96  74 - 99 mg/dL Final   ??? BUN 08/03/2016 16  7.0 - 18 MG/DL Final   ??? Creatinine 08/03/2016 0.77  0.6 - 1.3 MG/DL Final   ??? BUN/Creatinine ratio 08/03/2016 21* 12 - 20   Final   ??? GFR est AA 08/03/2016 >60  >60 ml/min/1.33m Final   ??? GFR est non-AA 08/03/2016 >60  >60 ml/min/1.715mFinal    Comment: (NOTE)  Estimated GFR is calculated using the Modification of Diet in Renal   Disease (MDRD) Study equation, reported for both African Americans   (GFRAA) and non-African Americans (GFRNA), and normalized to 1.7338m body surface area. The physician must decide which value applies to   the patient. The MDRD study equation should only be used in    individuals age 31 62 older. It has not been validated for the   following: pregnant women, patients with serious comorbid conditions,   or on certain medications, or persons with extremes of body size,   muscle mass, or nutritional status.     ??? Calcium 08/03/2016 9.4  8.5 - 10.1 MG/DL Final   ??? Bilirubin, total 08/03/2016 0.2  0.2 - 1.0 MG/DL Final   ??? ALT (SGPT) 08/03/2016 11* 13 - 56 U/L Final   ??? AST (SGOT) 08/03/2016 7* 15 - 37 U/L Final   ??? Alk. phosphatase 08/03/2016 69  45 - 117 U/L Final   ??? Protein, total 08/03/2016 7.6  6.4 - 8.2 g/dL Final   ??? Albumin 08/03/2016 3.2* 3.4 - 5.0 g/dL Final   ??? Globulin 08/03/2016 4.4* 2.0 - 4.0 g/dL Final   ??? A-G Ratio 08/03/2016 0.7* 0.8 - 1.7   Final   ??? Color 08/03/2016 YELLOW    Final   ??? Appearance 08/03/2016 CLEAR    Final   ??? Specific gravity 08/03/2016 1.014  1.005 - 1.030   Final   ??? pH (UA) 08/03/2016 7.5  5.0 - 8.0   Final   ??? Protein 08/03/2016 NEGATIVE   NEG mg/dL Final   ??? Glucose 08/03/2016 NEGATIVE   NEG mg/dL Final   ??? Ketone 08/03/2016 NEGATIVE   NEG mg/dL Final   ??? Bilirubin 08/03/2016 NEGATIVE   NEG   Final   ??? Blood 08/03/2016 NEGATIVE   NEG   Final   ??? Urobilinogen 08/03/2016 0.2  0.2 -  1.0 EU/dL Final   ??? Nitrites 08/03/2016 NEGATIVE   NEG   Final   ??? Leukocyte Esterase 08/03/2016 NEGATIVE   NEG   Final   Admission on 07/05/2016, Discharged on 07/12/2016   No results displayed because visit has over 200 results.      Admission on 07/03/2016, Discharged on 07/03/2016   Component Date Value Ref Range Status   ??? Special Requests: 07/03/2016 NO SPECIAL REQUESTS    Final   ??? Culture result: 07/03/2016 NO GROWTH 6 DAYS    Final   ??? Special Requests: 07/03/2016 NO SPECIAL REQUESTS    Final   ??? Culture result: 07/03/2016 NO GROWTH 6 DAYS    Final   ??? Color 07/03/2016 YELLOW    Final   ??? Appearance 07/03/2016 CLOUDY    Final   ??? Specific gravity 07/03/2016 1.020  1.005 - 1.030   Final   ??? pH (UA) 07/03/2016 6.5  5.0 - 8.0   Final    ??? Protein 07/03/2016 30* NEG mg/dL Final   ??? Glucose 07/03/2016 NEGATIVE   NEG mg/dL Final   ??? Ketone 07/03/2016 15* NEG mg/dL Final   ??? Bilirubin 07/03/2016 NEGATIVE   NEG   Final   ??? Blood 07/03/2016 NEGATIVE   NEG   Final   ??? Urobilinogen 07/03/2016 1.0  0.2 - 1.0 EU/dL Final   ??? Nitrites 07/03/2016 NEGATIVE   NEG   Final   ??? Leukocyte Esterase 07/03/2016 LARGE* NEG   Final   ??? Sodium 07/03/2016 137  136 - 145 mmol/L Final   ??? Potassium 07/03/2016 3.2* 3.5 - 5.5 mmol/L Final   ??? Chloride 07/03/2016 101  100 - 108 mmol/L Final   ??? CO2 07/03/2016 29  21 - 32 mmol/L Final   ??? Anion gap 07/03/2016 7  3.0 - 18 mmol/L Final   ??? Glucose 07/03/2016 146* 74 - 99 mg/dL Final   ??? BUN 07/03/2016 14  7.0 - 18 MG/DL Final   ??? Creatinine 07/03/2016 0.78  0.6 - 1.3 MG/DL Final   ??? BUN/Creatinine ratio 07/03/2016 18  12 - 20   Final   ??? GFR est AA 07/03/2016 >60  >60 ml/min/1.78m Final   ??? GFR est non-AA 07/03/2016 >60  >60 ml/min/1.730mFinal    Comment: (NOTE)  Estimated GFR is calculated using the Modification of Diet in Renal   Disease (MDRD) Study equation, reported for both African Americans   (GFRAA) and non-African Americans (GFRNA), and normalized to 1.7353m body surface area. The physician must decide which value applies to   the patient. The MDRD study equation should only be used in   individuals age 1 66 older. It has not been validated for the   following: pregnant women, patients with serious comorbid conditions,   or on certain medications, or persons with extremes of body size,   muscle mass, or nutritional status.     ??? Calcium 07/03/2016 9.4  8.5 - 10.1 MG/DL Final   ??? Bilirubin, total 07/03/2016 0.6  0.2 - 1.0 MG/DL Final   ??? ALT (SGPT) 07/03/2016 27  13 - 56 U/L Final   ??? AST (SGOT) 07/03/2016 19  15 - 37 U/L Final   ??? Alk. phosphatase 07/03/2016 71  45 - 117 U/L Final   ??? Protein, total 07/03/2016 8.3* 6.4 - 8.2 g/dL Final   ??? Albumin 07/03/2016 3.8  3.4 - 5.0 g/dL Final    ??? Globulin 07/03/2016 4.5* 2.0 - 4.0 g/dL  Final   ??? A-G Ratio 07/03/2016 0.8  0.8 - 1.7   Final   ??? WBC 07/03/2016 8.3  4.6 - 13.2 K/uL Final   ??? RBC 07/03/2016 4.56  4.20 - 5.30 M/uL Final   ??? HGB 07/03/2016 12.2  12.0 - 16.0 g/dL Final   ??? HCT 07/03/2016 37.6  35.0 - 45.0 % Final   ??? MCV 07/03/2016 82.5  74.0 - 97.0 FL Final   ??? MCH 07/03/2016 26.8  24.0 - 34.0 PG Final   ??? MCHC 07/03/2016 32.4  31.0 - 37.0 g/dL Final   ??? RDW 07/03/2016 15.2* 11.6 - 14.5 % Final   ??? PLATELET 07/03/2016 348  135 - 420 K/uL Final   ??? MPV 07/03/2016 9.6  9.2 - 11.8 FL Final   ??? NEUTROPHILS 07/03/2016 72  40 - 73 % Final   ??? LYMPHOCYTES 07/03/2016 18* 21 - 52 % Final   ??? MONOCYTES 07/03/2016 10  3 - 10 % Final   ??? EOSINOPHILS 07/03/2016 0  0 - 5 % Final   ??? BASOPHILS 07/03/2016 0  0 - 2 % Final   ??? ABS. NEUTROPHILS 07/03/2016 5.9  1.8 - 8.0 K/UL Final   ??? ABS. LYMPHOCYTES 07/03/2016 1.5  0.9 - 3.6 K/UL Final   ??? ABS. MONOCYTES 07/03/2016 0.9  0.05 - 1.2 K/UL Final   ??? ABS. EOSINOPHILS 07/03/2016 0.0  0.0 - 0.4 K/UL Final   ??? ABS. BASOPHILS 07/03/2016 0.0  0.0 - 0.1 K/UL Final   ??? DF 07/03/2016 AUTOMATED    Final   ??? Ventricular Rate 07/03/2016 105  BPM Final   ??? Atrial Rate 07/03/2016 105  BPM Final   ??? P-R Interval 07/03/2016 140  ms Final   ??? QRS Duration 07/03/2016 72  ms Final   ??? Q-T Interval 07/03/2016 358  ms Final   ??? QTC Calculation (Bezet) 07/03/2016 473  ms Final   ??? Calculated P Axis 07/03/2016 45  degrees Final   ??? Calculated R Axis 07/03/2016 18  degrees Final   ??? Calculated T Axis 07/03/2016 42  degrees Final   ??? Diagnosis 07/03/2016    Final                    Value:Sinus tachycardia  Biatrial enlargement  Nonspecific ST abnormality  Abnormal ECG  When compared with ECG of 23-Mar-2016 10:56,  Vent. rate has increased BY  36 BPM  Confirmed by Berna Spare MD, --- ([id number]) on 07/04/2016 11:23:23 AM     ??? Special Requests: 07/03/2016 NO SPECIAL REQUESTS    Final   ??? Culture result: 07/03/2016     Final                     Value:100000  COLONIES/mL  MIXED GRAM POSITIVE FLORA, PROBABLE SKIN/GENITAL CONTAMINATION.     ??? Sed rate, automated 07/03/2016 39* 0 - 30 mm/hr Final   ??? Lactic Acid (POC) 07/03/2016 0.9  0.4 - 2.0 mmol/L Final   ??? Glucose (POC) 07/03/2016 121* 70 - 110 mg/dL Final    Comment: (NOTE)  The FDA has indicated that no capillary point of care blood glucose   monitoring systems are approved for use in "critically ill" patients,   however they have not defined this population. The College of   American Pathologists has recommended that these devices should not   be used in cases such as severe hypotension, dehydration, shock, and   hyperglycemic-hyperosmolar state, amongst  others.  Venous or arterial   collection is the recommended specimen for testing these patients.     ??? WBC 07/03/2016 TOO NUMEROUS TO COUNT  0 - 4 /hpf Final   ??? RBC 07/03/2016 NEGATIVE   0 - 5 /hpf Final   ??? Epithelial cells 07/03/2016 2+  0 - 5 /lpf Final   ??? Bacteria 07/03/2016 2+* NEG /hpf Final   Hospital Outpatient Visit on 05/16/2016   Component Date Value Ref Range Status   ??? Creatinine, POC 05/16/2016 0.8  0.6 - 1.3 MG/DL Final   ??? GFRAA, POC 05/16/2016 >60  >60 ml/min/1.22m Final   ??? GFRNA, POC 05/16/2016 >60  >60 ml/min/1.772mFinal    Comment: Estimated GFR is calculated using the IDMS-traceable Modification of Diet in Renal Disease (MDRD) Study equation, reported for both African Americans (GFRAA) and non-African Americans (GFRNA), and normalized to 1.739mody surface area. The physician must decide which value applies to the patient.  The MDRD study equation should only be used in individuals age 45 58 older. It has not been validated for the following: pregnant women, patients with serious comorbid conditions, or on certain medications, or persons with extremes of body size, muscle mass, or nutritional status.     Office Visit on 03/29/2016   Component Date Value Ref Range Status    ??? AMPHETAMINES UR POC 03/29/2016 Negative   Final   ??? COCAINE UR POC 03/29/2016 Negative   Final   ??? MDMA/ECSTASY UR POC 03/29/2016 Negative   Final   ??? METHADONE UR POC 03/29/2016 Negative   Final   ??? METHAMPHETAMINE UR POC 03/29/2016 Negative   Final   ??? METHYLPHENIDATE UR POC 03/29/2016 Negative   Final   ??? OPIATES UR POC 03/29/2016 Negative   Final   ??? OXYCODONE UR POC 03/29/2016 Negative   Final   ??? PHENCYCLIDINE UR POC 03/29/2016 Negative   Final   ??? TRICYCLICS UR POC 03/18/77/2956gative   Final   ??? BARBITURATES UR POC 03/29/2016 Negative   Final   ??? BENZODIAZEPINES UR POC 03/29/2016 Negative   Final   ??? CANNABINOIDS UR POC 03/29/2016 Negative   Final   Hospital Outpatient Visit on 03/23/2016   Component Date Value Ref Range Status   ??? Diagnosis 03/23/2016 Negative response to lexiscan by ecg criteria.   Preliminary   ??? Test indication 03/23/2016 Chest Discomfort   Preliminary   ??? Functional capacity 03/23/2016 Could Not Be Adequately Assessed   Preliminary   ??? ECG Interp. Before Exercise 03/23/2016 Normal   Preliminary   ??? ECG Interp. During Exercise 03/23/2016 none   Preliminary   ??? Overall HR response to exercise 03/23/2016 appropriate   Preliminary   ??? Overall BP response to exercise 03/23/2016 resting hypertension - appropriate response   Preliminary   ??? Max. Systolic BP 03/25/29/86572846mHg Preliminary   ??? Max. Diastolic BP 04/96/29/5284  mmHg Preliminary   ??? Max. Heart rate 03/23/2016 97  BPM Preliminary   ??? Peak Ex METs 03/23/2016 1.0  METS Preliminary   ??? [facility]2016 [facility]       Preliminary   ??? Attending physician 03/23/2016 AbdMickle PlumbD   Preliminary     Pulmonary Function Test; 05/10/16  Patient effort:   Good  Meets ATS criteria for interpretation    Flows:  Normal flows with low normal FVC.  Normal FEV1  FEV 1% is normal    Normal Flow Volume Loop  Bronchodilator:  No significant improvement with bronchodilator therapy  Impression:  Normal study  Imaging:   I have personally reviewed the patient???s radiographs and have reviewed the reports:  XR Results (most recent):    Results from Hospital Encounter encounter on 08/03/16   XR HIPS BI W AP PELV   Narrative BILATERAL HIPS    HISTORY: Bilateral hip pain radiating to the lower abdomen, pain is exacerbated  by walking.    COMPARISON: None.    FINDINGS:     AP pelvis and bilateral frog views of the hips obtained. The bony pelvic ring is  intact. The left hip joint space is unremarkable. The right hip joint space is  unremarkable. There is no evidence of fracture or dislocation. Mineralization is  normal. Laminectomy at L5. Pedicle rod fusion at L4, L5, S1.         Impression IMPRESSION:    Negative bilateral hips..        Results from [facility] encounter on 07/05/16   CTA CHEST W OR W WO CONT   Narrative CT Chest Pulmonary    CPT CODE: 99357    HISTORY: Chest pain and shortness of breath. History of PE.    COMPARISON: Chest 2 views 07/05/2016. CT chest pulmonary 05/16/2016.    TECHNIQUE: 2.5 millimeter contiguous axial images from the lung apices to the  level of the adrenal glands following timed contrast bolus for maximum  intravascular enhancement of the pulmonary arteries.  Images reviewed in soft  tissue and lung windows. Coronal and sagittal MIP reformations were performed  for better evaluation of tortuous branching pulmonary vessels. Isovue-370 100 mL  IV contrast.    FINDINGS:     Suboptimal contrast bolus timing. The main pulmonary artery and lobar branches  are fairly well opacified, but the segmental and distal branches are not as well  opacified with contrast. Within this limitation there is no definite finding of  an acute pulmonary embolus. No focal filling defect within the main pulmonary  arteries or lobar branches. Some heterogeneous contrast density within the right  upper lobe and right middle lobe segmental branches, but not occlusive.    Aorta is moderately well opacified, appears normal.     Small bilateral pleural effusions. Lobar consolidation in the posterior right  lower lobe. Some subsegmental opacification in the right middle lobe. Some  linear opacities in the lingula and posterior left lower lobe likely  subsegmental atelectasis.    The heart is enlarged, similar to the prior exam. No pericardial effusion.    Small hiatal hernia. Visible portions of the liver and spleen appear normal.  Adrenals are not enlarged. Visible portions of the pancreas and kidneys  unremarkable.         Impression IMPRESSION:    Less than optimal contrast bolus. Proximal pulmonary arteries are well  opacified, more distal vessels not as well opacified limiting evaluation for  pulmonary embolus beyond the lobar branches.    Within the limitations of the exam there is no finding of central pulmonary  embolus.    Lobar consolidation in the posterior right lower lobe consistent with pneumonia.  Right middle lobe opacity that may be atelectasis or infection.        Results for orders placed or performed in visit on 07/18/16   AMB POC EKG ROUTINE W/ 12 LEADS, INTER & REP    Narrative    Normal sinus rhythm, rate 88.  Low voltage in precordial leads.  Otherwise normal tracing.   Results  for orders placed or performed during the hospital encounter of 07/05/16   EKG, 12 LEAD, INITIAL   Result Value Ref Range    Ventricular Rate 97 BPM    Atrial Rate 97 BPM    P-R Interval 140 ms    QRS Duration 70 ms    Q-T Interval 382 ms    QTC Calculation (Bezet) 485 ms    Calculated P Axis 63 degrees    Calculated R Axis 1 degrees    Calculated T Axis 36 degrees    Diagnosis       Normal sinus rhythm  Normal ECG  When compared with ECG of 05-Jul-2016 17:25,  No significant change was found  Confirmed by Burnell Blanks MD ([id number]), editor Burton Apley [phone]) on   07/06/2016 6:29:12 AM           Results from [place] encounter on 05/16/16   US BREASTS LIMITED=<3 QUAD   Narrative DIGITAL DIAGNOSTIC BILATERAL MAMMOGRAM   LIMITED BILATERAL BREAST ULTRASOUND    INDICATION:  First 6 month follow-up of right breast retroareolar asymmetry left  breast calcifications, left breast duct ectasia    COMPARISON:  12/2015, 11/2015      MAMMOGRAM TECHNIQUE/FINDINGS: Digital mammography was performed with CAD of both  breasts. Routine views, as well as left ML views and left magnification views  were obtained.    Right breast: Stable asymmetric tissue density at the upper outer subareolar  right breast. No new suspicious mass or suspicious calcifications are seen in  the right breast.    Left breast:  -Similar appearance of moderate duct ectasia in the left breast.  -Stable group of punctate microcalcifications at the far posterior upper outer  left breast.    ULTRASOUND FINDINGS: Ultrasound evaluation was performed of the subareolar right  breast and retroareolar left breast.  -Right breast: At the upper outer subareolar right breast, there is focal dense  fibroglandular tissue, which probably corresponds with the mammographic  asymmetry. Mild retroareolar duct ectasia noted in the right breast. No  suspicious solid mass was seen.    -Left breast: At the retroareolar left breast, multiple dilated ducts are noted,  without evidence for intraductal mass.           Impression IMPRESSION:     1. Stable group of calcifications at the upper outer left breast, and stable  left breast duct ectasia.  -Recommend short interval six-month follow-up diagnostic left mammogram and  ultrasound to complete one year surveillance.    2. Stable asymmetry at the upper outer subareolar right breast.  -Recommend short interval six-month follow-up diagnostic right mammogram to  complete one year surveillance. Ultrasound if necessary.    BIRADS 3:  Probably Benign  Breast density B: There are scattered areas of fibroglandular density      Lab Results   Component Value Date/Time    D DIMER <0.27 01/18/2016 11:30 AM              Cynthia Marsh has a reminder for a "due or due soon" health maintenance. I have asked that she contact her primary care provider for follow-up on this health maintenance.     IMPRESSION:   ?? Hospital follow up post pneumonia (07/2016)  ?? Hx Pulmonary Embolus- Xarelto d/c'd by hematologist a wk ago  ?? Mild intermittent asthma, symptoms currently compensated.  ?? Hx Dysphagia  -PCP/GI following.  ?? Hx OSA- on CPAP-schedule to see Dr Timmothy Sours.      RECOMMENDATIONS:   ??  Reviewed repeat CXR 8/22-showed resolution  ?? Imaging and hospitalization records reviewed and summarized.   ?? Annual preventive vaccination discussed, recommend yearly flu vaccine if no contraindication.  ?? Pulmonary toilette, encouraged use os/s of pulmonary embolus reoccurrence.  ?? Reviewed all medications and discussed concerns, answered questions.  ?? Pt report will be moving back to [place] to be with daughter, recommend repeat D-Dimer in 3 month  to assess possible pulmonary embolus reoccurrence.       Larrie Kass, NP     Addendum 08/16/16:    Patient interviewed and examined independently and agree with the findings and recommendations of Chalmers Cater, NP.  This includes the history which demonstrates a resolving cough and shortness of breath absence of fever since treatment for pneumonia and normal sounding lungs on physical exam and review of chest x-ray from several weeks ago showing significant resolution of infiltrates in both lower lobes.  Also discussed with the patient the need for follow-up concerning her previous pulmonary embolus  in [place].  The patient also as noted above has an appointment for sleep apnea evaluation    Penny Pia MD

## 2016-08-18 ENCOUNTER — Encounter

## 2016-08-19 ENCOUNTER — Ambulatory Visit: Attending: Pulmonary Disease | Primary: Family Medicine

## 2016-08-19 ENCOUNTER — Ambulatory Visit: Admit: 2016-08-19 | Discharge: 2016-08-19 | Payer: MEDICARE | Attending: Pulmonary Disease | Primary: Family Medicine

## 2016-08-19 DIAGNOSIS — G4733 Obstructive sleep apnea (adult) (pediatric): Secondary | ICD-10-CM

## 2016-08-19 NOTE — Progress Notes (Signed)
Addendum 08/25/16:    Overnight Pule Oximetry Performed with patient on CPAP but off supplemental oxygen. On 9/18-9/19/17.    No significant desaturations below 88% noted.   The mean SpO2 was 96  Patient can discontinue nocturnal oxygen with CPAP therapy.    [employer] Report Summary will be scanned into  System    Doran ClayMichelle Lorana Maffeo, DO, [facility]  Pulmonary, Sleep, Critical Care Medicine

## 2016-08-19 NOTE — Progress Notes (Signed)
[facility] Pulmonary Specialist  Pulmonary, Critical Care, and Sleep Medicine     Office Progress Note- Initial Evaluation      Primary Care Physician: Lenn Sink, MD     Reason for Visit:  Evaluation for follow up OSA and CPAP therapy    Assessment:  1. OSA- Moderate (AHI: 17)- Optimized  2. Asthma  3. Pneumonia- August 2017  4. Obesity  5. Diastolic hypertension in clinic today- assymptomatic    Discussion:  [name] is a 65 y.o. female who has moderate OSA with nocturnal hypoxia that appears adequately controlled with current therapy. The patient is happy with treatment and is benefiting with improved sleep quality and daytime alertness.  Additional comorbidities include: Asthma, diabetes. She states she plans to move out the area by the end of September 2017.    Plan:  ?? Continue with current CPAP therapy of 12 cwp  ?? Treatment goals discussed.  ?? Obtain overnight pulse oximetry on CPAP and off supplemental oxygen.  ?? Renew supplies for one year.  ?? Potential consequences of untreated sleep apnea, and/or excessive daytime sleepiness were discussed with the patient.  ?? Transport planner provided.  ?? Healthy lifestyle changes to include weight loss and exercise discussed.  ?? Healthy sleep habits were reviewed and encouraged.  ?? Driver and workplace safety reviewed and discussed as appropriate. Drowsy and/or inattentive driving should be avoided.  ?? Follow up with PCP regarding hypertension.  ?? Follow up with pulmonary provider as directed for follow up imaging to ensure resolution of pulmonary infiltrate  ?? If the patient does move to [place] as planned I have dicussed with the patient that she will need to follow up with both pulmonary and sleep physician.  ?? Follow-up in 3 months, sooner should new symptoms or problems arise.      History of Present Illness: Ms Cynthia Marsh is a 65 y.o. female patient who presents for follow up of OSA and newly started CPAP.  The  patient is follow in our Pulmonary Clinic by Dr. Neale Burly.  She was recently hospitalized for pneumonia.  She is currently on CPAP: 12 cwp. And 2LPM supplemental oxygen.The history was provided by the patient.    Overall the patient is very happy with her CPAP. Prior to starting therapy she reported EDS, and disrupted sleep She use to wake up at least twice nightly for unknown reasons. Her sleep is now much more consolidated. She states that she not only awakens feeling refreshed but that she feels more alert throughout the day. She does not feel as dragged out by the end of the day. She still has some sleep fragmentation from low back pain with occasional radicular pain radiating to her legs. She does not get up at night to use restroom.  She denies any vivid dream, sleep paralysis, hallucinations, sleep walking, talking or other parasomnia behaviors.  Denies symptoms suggestive of cataplexy.      Sleep-Wake History:   Occupation:  Not working- disabled                       Shift work: Yes  But not now    She goes to bed at 20-2200. During this time she will use her phone, listen to TV. [place]  so lately her mind has been preoccupied this the upcoming life transition.    Teeth clenching or grindingis reported.    Naps: are not reported.  Leg Symptoms/Pain: She does not have unpleasant or crawling sensation in legs or strong urge to move when inactive.     Mood: Preoccupied but otherwise normal      Stop Bang 06/30/2016 05/24/2016   Does the patient snore loudly (louder than talking or loud enough to be heard through closed doors)? 0 1   Does the patient often feel tired, fatigued, or sleepy during the daytime, even after a "good" night's sleep? 1 0   Has anyone ever observed the patient stop breathing during their sleep?  0 1   Does the patient have or are they being treated for high blood pressure? 0 1   Is the patient's BMI greater than 35? 0 0    Is your neck circumference greater than 17 inches (Female) or 16 inches (Female)? 0 0   Is the patient older than 50? 1 1   Is the patient female? 0 0   OSA Score 2 4   Has the patient been referred to Sleep Medicine? - 1   Has the patient previously been diagnosed with Obstructive Sleep Apnea? - 0        PHQ over the last two weeks 08/19/2016   PHQ Not Done -   Little interest or pleasure in doing things Not at all   Feeling down, depressed or hopeless Not at all   Total Score PHQ 2 0       Epworth Scale 08/19/2016 06/30/2016 05/24/2016   Sitting and Reading 0 1 2   Watching TV 3 3 0   Sitting, inactive in a public place (e.g. a movie theater or meeting) 0 0 0   As a passenger in a car for an hour, without a break 0 0 0   Lying down to rest in the afternoon, when circumstances permit 0 0 0   Sitting and talking to someone 0 0 0   Sitting quietly after lunch without alcohol 0 0 0   In a car, while stopped for a few minutes in traffic 0 0 0   Epworth Sleepiness Score 3 4 2       Caffeine: None per report.    Positive Airway Pressure (PAP) Compliance  Report & Summary Trends  DME: [facility]    Date >4 hr use % time AHI PAP Rx Median Use Time Leak-Median   8/15-9/13/17 63 2.2 CPAP 12  EPR 1 06:28:00 6.9                               Past Medical History:  Past Medical History:   Diagnosis Date   ??? Anxiety    ??? Asthma    ??? 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 Cardiac echocardiogram 11/07/2015    EF 60%.  No RWMA.  Gr 1 DDfx.  RVSP 30 mmHg.     ??? Cardiovascular lower extremity venous duplex 11/06/2015    No DVT bilaterally.   ??? Chronic kidney disease    ??? Depression    ??? Developmental delay    ??? Diabetes (HCC)    ??? Diabetes mellitus (HCC)    ??? History of nuclear stress test 03/2016    No ischemia, no infarct, EF 67%.  Low risk study   ??? Hypertension    ??? Insomnia    ??? Osteoporosis    ??? Pneumonia 2010   ??? Thromboembolus (HCC)  Past Surgical History:  Past Surgical History:   Procedure Laterality Date    ??? BREAST SURGERY PROCEDURE UNLISTED       Left cyst removed    ??? HAND/FINGER SURGERY UNLISTED     ??? HX BACK SURGERY  2012    L4/L5/S1 fused in [place]   ??? HX CYST INCISION AND DRAINAGE Left    ??? HX CYST REMOVAL      calf    ??? HX GYN      complete hyst    ??? HX GYN      tubal ligation   ??? HX HYSTERECTOMY     ??? HX KNEE ARTHROSCOPY     ??? HX OOPHORECTOMY     ??? HX ORTHOPAEDIC      Right shoulder rotator cuff    ??? HX ORTHOPAEDIC      Left Little toe hammer toe, left big toe bunion removed   ??? HX ORTHOPAEDIC      Right knee torn meniscus   ??? HX ORTHOPAEDIC      CTS bilateral , Left thumb trigger finger    ??? HX WRIST FRACTURE TX     ??? LAMINECTOMY,CERVICAL     ??? PR ANESTH,SURGERY OF SHOULDER         Family History:  Family History   Problem Relation Age of Onset   ??? Diabetes Mother    ??? Diabetes Brother    ??? Cancer Sister    ??? Cancer Maternal Grandmother    ??? Cancer Daughter        Social History:  Social History   Substance Use Topics   ??? Smoking status: Never Smoker   ??? Smokeless tobacco: Never Used   ??? Alcohol use Yes      Comment: on occasion        Medications:  Current Outpatient Prescriptions on File Prior to Visit   Medication Sig Dispense Refill   ??? pioglitazone (ACTOS) 15 mg tablet Take  by mouth.     ??? dilTIAZem CD (CARTIA XT) 120 mg ER capsule Take  by mouth daily.     ??? pregabalin (LYRICA) 100 mg capsule 1 tab PO TID  Indications: pain 90 Cap 2   ??? OXYGEN-AIR DELIVERY SYSTEMS 2 L by Nasal route daily. bedtime with cpap     ??? cyclobenzaprine (FLEXERIL) 5 mg tablet Take 1 Tab by mouth nightly. (Patient taking differently: Take 10 mg by mouth nightly.) 7 Tab 0   ??? tiotropium bromide (SPIRIVA RESPIMAT) 1.25 mcg/actuation inhaler Take 2 Puffs by inhalation daily.     ??? busPIRone (BUSPAR) 15 mg tablet TK 1 T PO TID  5   ??? JANUMET 50-1,000 mg per tablet TK 1 T PO BID. STOP TAKING METFORMIN.  0   ??? azelastine (ASTELIN) 137 mcg (0.1 %) nasal spray 1 Spray by Both Nostrils route two (2) times a day.  4    ??? DULoxetine (CYMBALTA) 30 mg capsule Take 1 Cap by mouth daily.  0   ??? hydroCHLOROthiazide (HYDRODIURIL) 25 mg tablet Take 12.5 mg by mouth daily.  0   ??? mirtazapine (REMERON) 30 mg tablet Take 1 Tab by mouth daily.  0   ??? montelukast (SINGULAIR) 10 mg tablet Take 1 Tab by mouth daily.  1   ??? omeprazole (PRILOSEC) 20 mg capsule Take 1 Cap by mouth daily.  2   ??? oxybutynin chloride XL (DITROPAN XL) 10 mg CR tablet Take 1 Tab by mouth daily.  2   ???  zolpidem (AMBIEN) 10 mg tablet Take 1 Tab by mouth nightly.  0   ??? FREESTYLE LITE STRIPS strip USE TO CHECK BLOOD SUGAR BID  5   ??? FREESTYLE LANCETS 28 gauge misc CHECK BLOOD SUGAR BID  5   ??? pregabalin (LYRICA) 75 mg capsule Take 1 Cap by mouth three (3) times daily. Max Daily Amount: 225 mg. 90 Cap 1     No current facility-administered medications on file prior to visit.         Allergy:  Allergies   Allergen Reactions   ??? 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   ??? Almond Other (comments)     Tongue itches   ??? Carvedilol Other (comments)   ??? Cozaar [Losartan] Cough   ??? Crestor [Rosuvastatin] Unknown (comments)     Heart beats fast       Review of Systems  General ROS: negative for - chills, fatigue, fever, malaise or sleep disturbance, has noted some night sweats which is new  ENT ROS: positive for - headaches  negative for - epistaxis, hearing change, nasal congestion, sinus pain, sneezing or sore throat  Hematological and Lymphatic ROS: negative for - bleeding problems, blood clots, bruising, jaundice, pallor or swollen lymph nodes  Endocrine ROS: negative for - polydipsia/polyuria, skin changes, temperature intolerance or unexpected weight changes  Respiratory ROS: no cough, shortness of breath, or wheezing  Cardiovascular ROS: no chest pain or dyspnea on exertion  Gastrointestinal ROS: no abdominal pain, change in bowel habits, or black or bloody stools  Genito-Urinary ROS: no dysuria, trouble voiding, or hematuria   Musculoskeletal ROS: positive for - intermittent low back pain  Neurological ROS: no TIA or stroke symptoms  Dermatological ROS: negative for - pruritus, rash or skin lesion changes   Psychological ROS: negative   Otherwise negative and per HPI      Physical Exam:  Blood pressure (!) 128/100, pulse 91, temperature 97.2 ??F (36.2 ??C), temperature source Oral, resp. rate 16, height 5\' 4"  (1.626 m), weight 81.6 kg (180 lb), SpO2 96 %.on room air, Body mass index is 30.9 kg/(m^2).   General: in no respiratory distress and acyanotic, appears stated age, alert, cooperative, no distress, appears stated age    HEENT: PERRL, EOMI, throat without erythema or exudate, Tongue-midline, normal porportion mild dental indention on tongue, good dentition Mallampati's score 3+, Uvula- midline, Tonsils not seen, pillars wide, narrow posterior oropharynx. Tympanic membranes are clear.   Neck: Supple,  no abnormally enlarged lymph nodes, thyroid is not enlarged, non-tender  Chest: normal  Lungs: moderate air entry, clear to auscultation bilaterally,   Heart: Regular rate and rhythm, S1S2 present, without murmur- DBP elevated today- no headache, dizziness or chest pain  Abdomen: Protruberant, bowel sounds normoactive, abdomen is soft without significant tenderness, or guarding  Extremity: negative for edema, cyanosis or clubbing- patient wearing dark-colored nail polish  Skin: Skin color, texture, turgor normal. No rashes or lesions    Data Reviewed:  CBC: Lab Results   Component Value Date/Time    WBC 5.5 08/03/2016 11:28 AM    HGB 10.0 08/03/2016 11:28 AM    HCT 32.7 08/03/2016 11:28 AM    PLATELET 337 08/03/2016 11:28 AM    MCV 81.5 08/03/2016 11:28 AM       BMP: Lab Results   Component Value Date/Time    Sodium 136 08/03/2016 11:28 AM    Potassium 3.5 08/03/2016 11:28 AM    Chloride 101 08/03/2016  11:28 AM    CO2 30 08/03/2016 11:28 AM    Anion gap 5 08/03/2016 11:28 AM    Glucose 96 08/03/2016 11:28 AM    BUN 16 08/03/2016 11:28 AM     Creatinine 0.77 08/03/2016 11:28 AM    BUN/Creatinine ratio 21 08/03/2016 11:28 AM    GFR est AA >60 08/03/2016 11:28 AM    GFR est non-AA >60 08/03/2016 11:28 AM    Calcium 9.4 08/03/2016 11:28 AM      Lab Results   Component Value Date/Time    TSH 1.62 07/07/2016 02:00 AM    TSH 1.34 07/05/2016 10:32 PM    TSH 0.72 02/03/2016 06:22 AM    TSH 0.77 06/02/2015 01:10 PM       Imaging:  I have personally reviewed the patient???s radiographs section     Results from Hospital Encounter encounter on 08/03/16   XR HIPS BI W AP PELV   Narrative BILATERAL HIPS    HISTORY: Bilateral hip pain radiating to the lower abdomen, pain is exacerbated  by walking.    COMPARISON: None.    FINDINGS:     AP pelvis and bilateral frog views of the hips obtained. The bony pelvic ring is  intact. The left hip joint space is unremarkable. The right hip joint space is  unremarkable. There is no evidence of fracture or dislocation. Mineralization is  normal. Laminectomy at L5. Pedicle rod fusion at L4, L5, S1.         Impression IMPRESSION:    Negative bilateral hips..          Results from Hospital Encounter encounter on 07/05/16   CTA CHEST W OR W WO CONT   Narrative CT Chest Pulmonary    CPT CODE: [id number]    HISTORY: Chest pain and shortness of breath. History of PE.    COMPARISON: Chest 2 views 07/05/2016. CT chest pulmonary 05/16/2016.    TECHNIQUE: 2.5 millimeter contiguous axial images from the lung apices to the  level of the adrenal glands following timed contrast bolus for maximum  intravascular enhancement of the pulmonary arteries.  Images reviewed in soft  tissue and lung windows. Coronal and sagittal MIP reformations were performed  for better evaluation of tortuous branching pulmonary vessels. Isovue-370 100 mL  IV contrast.    FINDINGS:     Suboptimal contrast bolus timing. The main pulmonary artery and lobar branches  are fairly well opacified, but the segmental and distal branches are not as well   opacified with contrast. Within this limitation there is no definite finding of  an acute pulmonary embolus. No focal filling defect within the main pulmonary  arteries or lobar branches. Some heterogeneous contrast density within the right  upper lobe and right middle lobe segmental branches, but not occlusive.    Aorta is moderately well opacified, appears normal.    Small bilateral pleural effusions. Lobar consolidation in the posterior right  lower lobe. Some subsegmental opacification in the right middle lobe. Some  linear opacities in the lingula and posterior left lower lobe likely  subsegmental atelectasis.    The heart is enlarged, similar to the prior exam. No pericardial effusion.    Small hiatal hernia. Visible portions of the liver and spleen appear normal.  Adrenals are not enlarged. Visible portions of the pancreas and kidneys  unremarkable.         Impression IMPRESSION:    Less than optimal contrast bolus. Proximal pulmonary arteries are well  opacified, more distal  vessels not as well opacified limiting evaluation for  pulmonary embolus beyond the lobar branches.    Within the limitations of the exam there is no finding of central pulmonary  embolus.    Lobar consolidation in the posterior right lower lobe consistent with pneumonia.  Right middle lobe opacity that may be atelectasis or infection.         Cardiac Echo: 07/06/16  SUMMARY:  Procedure information: This was a technically difficult study.    Left ventricle: Systolic function was normal by visual assessment.  Ejection fraction was estimated to be 60 %. No obvious wall motion  abnormalities identified in the views obtained.    Right ventricle: Systolic pressure was mildly increased. Estimated peak  pressure was 45 mmHg.    COMPARISONS:  Comparison was made with the previous study of 07-Nov-2015. Pulmonary  artery pressure has increased from 30 mmHg to 45 mmHg.    INDICATIONS: Abnormal cardiac markers.    Historical Sleep Testing Data:   Split Study: 06/09/16:  AHI:17 with persistent hypoxia  CPAP Titration: 07/07/16: Successful titration : CPAP 12 cwp  With oxygen at 1LPM    - right lobar infiltrate noted on CT chest 07/05/16        Doran Clay, DO, [facility]  Pulmonary, Sleep and Critical Care Medicine

## 2016-08-24 NOTE — Telephone Encounter (Signed)
Cynthia LevanFrankie Marsh called because she would like to have her results because she is going out of town. I told patient that Dr. Benjamine SpraguePerello has not read the results quite yet and that someone would be getting back with her once they have been read. Please [phone]

## 2016-08-25 ENCOUNTER — Telehealth

## 2016-08-25 ENCOUNTER — Inpatient Hospital Stay: Admit: 2016-08-25 | Payer: PRIVATE HEALTH INSURANCE | Attending: Family Medicine | Primary: Family Medicine

## 2016-08-25 DIAGNOSIS — R413 Other amnesia: Secondary | ICD-10-CM

## 2016-08-25 MED ORDER — GADOBUTROL 10 MMOL/10 ML (1 MMOL/ML) IV
10 mmol/ mL (1 mmol/mL) | Freq: Once | INTRAVENOUS | Status: AC
Start: 2016-08-25 — End: 2016-08-25
  Administered 2016-08-25: 14:00:00 via INTRAVENOUS

## 2016-08-25 MED FILL — GADAVIST 10 MMOL/10 ML (1 MMOL/ML) INTRAVENOUS SOLUTION: 10 mmol/ mL (1 mmol/mL) | INTRAVENOUS | Qty: 10

## 2016-08-25 NOTE — Telephone Encounter (Signed)
Discontinue and pick up order placed and faxed to [facility]. Patient notified that this has been done, copy of order will be left at the front desk for patient to pick up if she would like.

## 2016-08-25 NOTE — Telephone Encounter (Signed)
PT [phone]).   [facility] TOLD PT TO HAVE DR PERELLO WRITE A NOTE OR ORDER THAT PT NO LONGER NEEDS O2.  PLEASE CALL PT WHEN LETTER IS DONE AND SHE WILL PICK IT UP.

## 2016-08-25 NOTE — Telephone Encounter (Signed)
Message forwarded to Dr. Benjamine Sprague. Overnight POX results were faxed to our office by [facility] on 08/24/2016 and placed in Dr. Cecille Amsterdam box yesterday afternoon.

## 2016-08-26 ENCOUNTER — Ambulatory Visit: Attending: Physician Assistant | Primary: Family Medicine

## 2016-08-26 ENCOUNTER — Ambulatory Visit: Admit: 2016-08-26 | Discharge: 2016-08-26 | Payer: MEDICARE | Attending: Physician Assistant | Primary: Family Medicine

## 2016-08-26 DIAGNOSIS — M25561 Pain in right knee: Secondary | ICD-10-CM

## 2016-08-26 MED ORDER — BETAMETHASONE ACET & SOD PHOS 6 MG/ML SUSP FOR INJECTION
6 mg/mL | Freq: Once | INTRAMUSCULAR | 0 refills | Status: AC
Start: 2016-08-26 — End: 2016-08-26

## 2016-08-26 NOTE — Progress Notes (Signed)
[facility]  [address]  [place] [address]  [phone]           Patient: Cynthia Marsh                MRN: [id number]       SSN: [id number]  Date of Birth: September 09, 1951        AGE: 65 y.o.        SEX: female  Body mass index is 29.87 kg/(m^2).    PCP: Mickle Plumb, MD  08/26/16      This office note has been dictated.      REVIEW OF SYSTEMS:  Constitutional: Negative for fever, chills, weight loss and malaise/fatigue.   HENT: Negative.    Eyes: Negative.    Respiratory: Negative.   Cardiovascular: Negative.   Gastrointestinal: No bowel incontinence or constipation.  Genitourinary: No bladder incontinence or saddle anesthesia.  Skin: Negative.   Neurological: Negative.    Endo/Heme/Allergies: Negative.    Psychiatric/Behavioral: Negative.  Musculoskeletal: As per HPI above.     Past Medical History:   Diagnosis Date   ??? Anxiety    ??? Asthma    ??? 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 Cardiac echocardiogram 11/07/2015    EF 60%.  No RWMA.  Gr 1 DDfx.  RVSP 30 mmHg.     ??? Cardiovascular lower extremity venous duplex 11/06/2015    No DVT bilaterally.   ??? Chronic kidney disease    ??? Depression    ??? Developmental delay    ??? Diabetes ([place])    ??? Diabetes mellitus ([place])    ??? History of nuclear stress test 03/2016    No ischemia, no infarct, EF 67%.  Low risk study   ??? Hypertension    ??? Insomnia    ??? Osteoporosis    ??? Pneumonia 2010   ??? Thromboembolus ([place])          Current Outpatient Prescriptions:   ???  cpap machine kit, by Does Not Apply route., Disp: , Rfl:   ???  pioglitazone (ACTOS) 15 mg tablet, Take  by mouth., Disp: , Rfl:   ???  dilTIAZem CD (CARTIA XT) 120 mg ER capsule, Take  by mouth daily., Disp: , Rfl:   ???  pregabalin (LYRICA) 100 mg capsule, 1 tab PO TID  Indications: pain, Disp: 90 Cap, Rfl: 2  ???  OXYGEN-AIR DELIVERY SYSTEMS, 2 L by Nasal route daily. bedtime with cpap, Disp: , Rfl:    ???  cyclobenzaprine (FLEXERIL) 5 mg tablet, Take 1 Tab by mouth nightly. (Patient taking differently: Take 10 mg by mouth nightly.), Disp: 7 Tab, Rfl: 0  ???  tiotropium bromide (SPIRIVA RESPIMAT) 1.25 mcg/actuation inhaler, Take 2 Puffs by inhalation daily., Disp: , Rfl:   ???  FREESTYLE LITE STRIPS strip, USE TO CHECK BLOOD SUGAR BID, Disp: , Rfl: 5  ???  busPIRone (BUSPAR) 15 mg tablet, TK 1 T PO TID, Disp: , Rfl: 5  ???  FREESTYLE LANCETS 28 gauge misc, CHECK BLOOD SUGAR BID, Disp: , Rfl: 5  ???  JANUMET 50-1,000 mg per tablet, TK 1 T PO BID. STOP TAKING METFORMIN., Disp: , Rfl: 0  ???  pregabalin (LYRICA) 75 mg capsule, Take 1 Cap by mouth three (3) times daily. Max Daily Amount: 225 mg., Disp: 90 Cap, Rfl: 1  ???  azelastine (ASTELIN) 137 mcg (0.1 %) nasal spray, 1 Spray by Both Nostrils route  two (2) times a day., Disp: , Rfl: 4  ???  DULoxetine (CYMBALTA) 30 mg capsule, Take 1 Cap by mouth daily., Disp: , Rfl: 0  ???  hydroCHLOROthiazide (HYDRODIURIL) 25 mg tablet, Take 12.5 mg by mouth daily., Disp: , Rfl: 0  ???  mirtazapine (REMERON) 30 mg tablet, Take 1 Tab by mouth daily., Disp: , Rfl: 0  ???  montelukast (SINGULAIR) 10 mg tablet, Take 1 Tab by mouth daily., Disp: , Rfl: 1  ???  omeprazole (PRILOSEC) 20 mg capsule, Take 1 Cap by mouth daily., Disp: , Rfl: 2  ???  oxybutynin chloride XL (DITROPAN XL) 10 mg CR tablet, Take 1 Tab by mouth daily., Disp: , Rfl: 2  ???  zolpidem (AMBIEN) 10 mg tablet, Take 1 Tab by mouth nightly., Disp: , Rfl: 0  No current facility-administered medications for this visit.     Allergies   Allergen Reactions   ??? 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   ??? Almond Other (comments)     Tongue itches   ??? Carvedilol Other (comments)   ??? Cozaar [Losartan] Cough   ??? Crestor [Rosuvastatin] Unknown (comments)     Heart beats fast       Social History     Social History   ??? Marital status: SINGLE     Spouse name: N/A   ??? Number of children: N/A   ??? Years of education: N/A      Occupational History   ??? Not on file.     Social History Main Topics   ??? Smoking status: Never Smoker   ??? Smokeless tobacco: Never Used   ??? Alcohol use Yes      Comment: on occasion   ??? Drug use: No   ??? Sexual activity: Not on file     Other Topics Concern   ??? Not on file     Social History Narrative       Past Surgical History:   Procedure Laterality Date   ??? BREAST SURGERY PROCEDURE UNLISTED       Left cyst removed    ??? FOOT/TOES SURGERY PROC UNLISTED     ??? HAND/FINGER SURGERY UNLISTED     ??? HX BACK SURGERY  2012    L4/L5/S1 fused in [place]   ??? HX CYST INCISION AND DRAINAGE Left    ??? HX CYST REMOVAL      calf    ??? HX GYN      complete hyst    ??? HX GYN      tubal ligation   ??? HX HYSTERECTOMY     ??? HX KNEE ARTHROSCOPY     ??? HX OOPHORECTOMY     ??? HX ORTHOPAEDIC      Right shoulder rotator cuff    ??? HX ORTHOPAEDIC      Left Little toe hammer toe, left big toe bunion removed   ??? HX ORTHOPAEDIC      Right knee torn meniscus   ??? HX ORTHOPAEDIC      CTS bilateral , Left thumb trigger finger    ??? HX WRIST FRACTURE TX     ??? LAMINECTOMY,CERVICAL     ??? PR ANESTH,SURGERY OF SHOULDER             * Patient was identified by name and date of birth   * Agreement on procedure being performed was verified  * Risks and Benefits explained to the patient  * Procedure  site verified and marked as necessary  * Patient was positioned for comfort  * Consent was signed and verified  8:56 AM    The patient was instructed on post injection care.          We did see Cynthia Marsh for followup with regards to mainly her right knee.  The patient does have known advancing arthritis of the right knee.  She does get discomfort with extended walking.  She has less than 10-minute level walking tolerance.  She has trouble getting up from a chair and going up and down stairs.  She does get some locking in her knee at times.  She has occasional night pain.  She has had no recent fevers, chills,  systemic changes, and no injuries to report and no chest pain or shortness of breath.     PHYSICAL EXAMINATION:  In general, the patient is alert and oriented x 3 in no acute distress.  The patient is well-developed, well-nourished, with a normal affect.  The patient is afebrile. HEENT:  Head is normocephalic and atraumatic.  Pupils are equally round and reactive to light and accommodation.  Extraocular eye movements are intact.  Neck is supple.  Trachea is midline. No JVD is present.  Breathing is nonlabored.  Examination of the lower extremities reveals pain-free range of motion of the hips.  There is no pain to palpation of the greater trochanteric bursae.  There is negative straight leg raise.  There is negative calf tenderness.  There is negative Homan???s.  There is no evidence of DVT present. Examination of the right knee reveals the skin is intact.  There is no ecchymosis and no warmth.  There is negative joint effusion and negative patellar ballottement and no signs for infection or cellulitis present.  There is pain with palpation of the medial joint line and lateral joint line, as well as patellofemoral grind and crepitus anteriorly with range of motion activities is noted.  She has a negative McMurray's test.  However, she does have pain medially with the maneuver.       RADIOGRAPHS:  Radiographs in the office today, including AP, tunnel, lateral, and skyline of the right knee does confirm end-staged arthritis with bone-to-bone eburnation to the lateral and patellofemoral articulations.     ASSESSMENT:  Right knee advanced osteoarthritis.     PLAN:  At this point, we discussed treatment options.  She is still doing well with cortisone.  We will move forward with a cortisone injection today.   Under aseptic conditions, and after informed and written consent, with ultrasound-guided assistance, and a time out performed, the right knee was prepped with Betadine and 6 mg of Celestone was injected without  complications. The patient tolerated the injection well. The patient was instructed on post injection care.  We will see her back in the office in about three months??? time for evaluation.  She will call with any questions or concerns that shall arise.                    JR Ramadan Couey MPAS, PA-C, ATC

## 2016-09-02 ENCOUNTER — Ambulatory Visit: Admit: 2016-09-02 | Discharge: 2016-09-02 | Payer: MEDICARE | Attending: Physician Assistant | Primary: Family Medicine

## 2016-09-02 ENCOUNTER — Encounter: Attending: Physician Assistant | Primary: Family Medicine

## 2016-09-02 DIAGNOSIS — M1711 Unilateral primary osteoarthritis, right knee: Secondary | ICD-10-CM

## 2016-09-02 MED ORDER — IBUPROFEN 800 MG TAB
800 mg | ORAL_TABLET | Freq: Three times a day (TID) | ORAL | 1 refills | Status: AC | PRN
Start: 2016-09-02 — End: ?

## 2016-09-02 MED ORDER — HYDROCODONE-ACETAMINOPHEN 7.5 MG-325 MG TAB
ORAL_TABLET | Freq: Three times a day (TID) | ORAL | 0 refills | Status: AC | PRN
Start: 2016-09-02 — End: ?

## 2016-09-02 NOTE — Progress Notes (Signed)
Chief Complaint   Patient presents with   ??? Knee Pain     Right     10/10 pain.

## 2016-09-02 NOTE — Progress Notes (Signed)
[facility]  [address]  [place] [address]  [phone]           Patient: Cynthia Marsh                MRN: [id number]       SSN: [id number]  Date of Birth: Apr 29, 1951        AGE: 65 y.o.        SEX: female  Body mass index is 30.76 kg/(m^2).    PCP: Mickle Plumb, MD  09/02/16      This office note has been dictated.      REVIEW OF SYSTEMS:  Constitutional: Negative for fever, chills, weight loss and malaise/fatigue.   HENT: Negative.    Eyes: Negative.    Respiratory: Negative.   Cardiovascular: Negative.   Gastrointestinal: No bowel incontinence or constipation.  Genitourinary: No bladder incontinence or saddle anesthesia.  Skin: Negative.   Neurological: Negative.    Endo/Heme/Allergies: Negative.    Psychiatric/Behavioral: Negative.  Musculoskeletal: As per HPI above.     Past Medical History:   Diagnosis Date   ??? Anxiety    ??? Asthma    ??? 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 Cardiac echocardiogram 11/07/2015    EF 60%.  No RWMA.  Gr 1 DDfx.  RVSP 30 mmHg.     ??? Cardiovascular lower extremity venous duplex 11/06/2015    No DVT bilaterally.   ??? Chronic kidney disease    ??? Depression    ??? Developmental delay    ??? Diabetes ([place])    ??? Diabetes mellitus ([place])    ??? History of nuclear stress test 03/2016    No ischemia, no infarct, EF 67%.  Low risk study   ??? Hypertension    ??? Insomnia    ??? Osteoporosis    ??? Pneumonia 2010   ??? Thromboembolus ([place])          Current Outpatient Prescriptions:   ???  pioglitazone (ACTOS) 15 mg tablet, Take  by mouth., Disp: , Rfl:   ???  dilTIAZem CD (CARTIA XT) 120 mg ER capsule, Take  by mouth daily., Disp: , Rfl:   ???  pregabalin (LYRICA) 100 mg capsule, 1 tab PO TID  Indications: pain, Disp: 90 Cap, Rfl: 2  ???  cyclobenzaprine (FLEXERIL) 5 mg tablet, Take 1 Tab by mouth nightly. (Patient taking differently: Take 10 mg by mouth nightly.), Disp: 7 Tab, Rfl: 0   ???  tiotropium bromide (SPIRIVA RESPIMAT) 1.25 mcg/actuation inhaler, Take 2 Puffs by inhalation daily., Disp: , Rfl:   ???  FREESTYLE LITE STRIPS strip, USE TO CHECK BLOOD SUGAR BID, Disp: , Rfl: 5  ???  busPIRone (BUSPAR) 15 mg tablet, TK 1 T PO TID, Disp: , Rfl: 5  ???  FREESTYLE LANCETS 28 gauge misc, CHECK BLOOD SUGAR BID, Disp: , Rfl: 5  ???  JANUMET 50-1,000 mg per tablet, TK 1 T PO BID. STOP TAKING METFORMIN., Disp: , Rfl: 0  ???  azelastine (ASTELIN) 137 mcg (0.1 %) nasal spray, 1 Spray by Both Nostrils route two (2) times a day., Disp: , Rfl: 4  ???  DULoxetine (CYMBALTA) 30 mg capsule, Take 1 Cap by mouth daily., Disp: , Rfl: 0  ???  hydroCHLOROthiazide (HYDRODIURIL) 25 mg tablet, Take 12.5 mg by mouth daily., Disp: , Rfl: 0  ???  mirtazapine (REMERON) 30 mg tablet, Take 1 Tab by mouth daily., Disp: ,  Rfl: 0  ???  montelukast (SINGULAIR) 10 mg tablet, Take 1 Tab by mouth daily., Disp: , Rfl: 1  ???  oxybutynin chloride XL (DITROPAN XL) 10 mg CR tablet, Take 1 Tab by mouth daily., Disp: , Rfl: 2  ???  zolpidem (AMBIEN) 10 mg tablet, Take 1 Tab by mouth nightly., Disp: , Rfl: 0  ???  cpap machine kit, by Does Not Apply route., Disp: , Rfl:   ???  OXYGEN-AIR DELIVERY SYSTEMS, 2 L by Nasal route daily. bedtime with cpap, Disp: , Rfl:   ???  pregabalin (LYRICA) 75 mg capsule, Take 1 Cap by mouth three (3) times daily. Max Daily Amount: 225 mg., Disp: 90 Cap, Rfl: 1  ???  omeprazole (PRILOSEC) 20 mg capsule, Take 1 Cap by mouth daily., Disp: , Rfl: 2    Allergies   Allergen Reactions   ??? 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   ??? Almond Other (comments)     Tongue itches   ??? Carvedilol Other (comments)   ??? Cozaar [Losartan] Cough   ??? Crestor [Rosuvastatin] Unknown (comments)     Heart beats fast       Social History     Social History   ??? Marital status: SINGLE     Spouse name: N/A   ??? Number of children: N/A   ??? Years of education: N/A     Occupational History   ??? Not on file.     Social History Main Topics    ??? Smoking status: Never Smoker   ??? Smokeless tobacco: Never Used   ??? Alcohol use Yes      Comment: on occasion   ??? Drug use: No   ??? Sexual activity: Not on file     Other Topics Concern   ??? Not on file     Social History Narrative       Past Surgical History:   Procedure Laterality Date   ??? BREAST SURGERY PROCEDURE UNLISTED       Left cyst removed    ??? FOOT/TOES SURGERY PROC UNLISTED     ??? HAND/FINGER SURGERY UNLISTED     ??? HX BACK SURGERY  2012    L4/L5/S1 fused in [place]   ??? HX CYST INCISION AND DRAINAGE Left    ??? HX CYST REMOVAL      calf    ??? HX GYN      complete hyst    ??? HX GYN      tubal ligation   ??? HX HYSTERECTOMY     ??? HX KNEE ARTHROSCOPY     ??? HX OOPHORECTOMY     ??? HX ORTHOPAEDIC      Right shoulder rotator cuff    ??? HX ORTHOPAEDIC      Left Little toe hammer toe, left big toe bunion removed   ??? HX ORTHOPAEDIC      Right knee torn meniscus   ??? HX ORTHOPAEDIC      CTS bilateral , Left thumb trigger finger    ??? HX WRIST FRACTURE TX     ??? LAMINECTOMY,CERVICAL     ??? PR ANESTH,SURGERY OF SHOULDER                 We did see Ms. Brosious for followup with regards to her right knee.  The patient does have known advanced arthritis of the right knee and had an injection of cortisone about one week ago and it  has not helped her as much as she would like.  She did say she sat at the doctor???s office a long time yesterday, which aggravated her back and had some pain, which was radiating down her lower extremity, which is better today.  She has had no change in her bowel or bladder habits.  She has been taking occasional Motrin for her discomfort.      She has had no recent fevers, chills, systemic changes, and no injuries to report, and no night sweats.     PHYSICAL EXAMINATION:  In general, the patient is alert and oriented x 3 in no acute distress.  The patient is well-developed, well-nourished, with a normal affect.  The patient is afebrile. HEENT:  Head is normocephalic  and atraumatic.  Pupils are equally round and reactive to light and accommodation.  Extraocular eye movements are intact.  Neck is supple.  Trachea is midline. No JVD is present.  Breathing is nonlabored.  Examination of the lower extremities reveals pain-free range of motion of the hips.  There is no pain to palpation of the greater trochanteric bursae.  There is negative straight leg raise.  There is negative calf tenderness.  There is negative Homan???s.  There is no evidence of DVT present. Examination of the right knee reveals the skin is intact.  There is no erythema, ecchymosis, no warmth.  There is minimal effusion and negative patellar ballottement, and no signs of infection or cellulitis present.  She does have pain to palpation tricompartmentally and crepitus arising from the anterior compartment. Findings are consistent with advanced arthritis of the right knee.      RADIOGRAPHS:  Review of previous radiographs does confirm end-staged arthritis with bone-to-bone eburnation to the lateral and patellofemoral articulation with a valgus deformity present.     ASSESSMENT:  Right knee advanced osteoarthritis.    PLAN:  At this point, it may take a little longer, another couple of weeks, for the cortisone to fully take effect.  However, she does understand that as her arthritis is worsening, the cortisone is less effective.  She was given a refill of her Ibuprofen, as well as a prescription for Norco.  We will see her back in the office in about three months??? time for evaluation.  We can discuss knee replacement further at this point if she would like.             JR Syerra Abdelrahman MPAS, PA-C, ATC

## 2016-09-09 ENCOUNTER — Ambulatory Visit (HOSPITAL_COMMUNITY)
Admission: EM | Admit: 2016-09-09 | Discharge: 2016-09-09 | Disposition: A | Discharge status: Home / Self Care | Payer: Medicare (Managed Care) | Attending: Family Medicine | Admitting: Family Medicine

## 2016-09-09 ENCOUNTER — Encounter (HOSPITAL_COMMUNITY): Payer: Self-pay | Admitting: Emergency Medicine

## 2016-09-09 DIAGNOSIS — B9789 Other viral agents as the cause of diseases classified elsewhere: Secondary | ICD-10-CM | POA: Diagnosis not present

## 2016-09-09 DIAGNOSIS — J069 Acute upper respiratory infection, unspecified: Secondary | ICD-10-CM | POA: Diagnosis not present

## 2016-09-09 MED ORDER — AZITHROMYCIN 250 MG PO TABS: ORAL_TABLET | ORAL | 0 refills | Status: DC

## 2016-09-09 MED ORDER — IPRATROPIUM BROMIDE 0.06 % NA SOLN: 2.0000 | Freq: Four times a day (QID) | NASAL | 1 refills | Status: DC

## 2016-09-09 NOTE — ED Triage Notes (Signed)
C/o cold sx onset x5 days associated w/HA, congestion, bilateral ear fullness, dry cough and sneezing  Reports she felt warm but did not take her temp  A&O x4.... NAD

## 2016-09-09 NOTE — ED Provider Notes (Signed)
[facility]    CSN: [id number] Arrival date & time: 09/09/16  1516     History   Chief Complaint Chief Complaint  Patient presents with  . URI    HPI Chelsea Benson is a 65 y.o. female.   The history is provided by the patient.  URI  Presenting symptoms: congestion, cough and rhinorrhea   Presenting symptoms: no fever   Severity:  Mild Onset quality:  Gradual Duration:  5 days Progression:  Unchanged Chronicity:  New Relieved by:  None tried Worsened by:  Nothing Ineffective treatments:  None tried Risk factors: recent travel     Past Medical History:  Diagnosis Date  . Anxiety   . Arthritis    DDD, spondylosis  . Asthma   . Chronic kidney disease    renal calculi  . Diabetes mellitus without complication (H[facility])   . H/O echocardiogram    states she had echo in [facility]. a couple of yrs. ago  . Headache(784.0)    sinus related   . Hypertension   . Mental disorder   . Pneumonia    seen in ED at [facility]- for pneumonia, Jan./ 2013- treated & sent home     There are no active problems to display for this patient.   Past Surgical History:  Procedure Laterality Date  . ABDOMINAL HYSTERECTOMY    . BACK SURGERY     2012- lumbar fusion  . BREAST SURGERY     L cyst removed - 1972  . BUNIONECTOMY     L foot  . calf -R- cyst removed    . CARPAL TUNNEL RELEASE     both hands   . great toe     removed arthritis   . NASAL SINUS SURGERY    . SHOULDER ARTHROSCOPY     R shoulder- RCR  . TRIGGER FINGER RELEASE     L thumb    OB History    No data available       Home Medications    Prior to Admission medications   Medication Sig Start Date End Date Taking? Authorizing Provider  beclomethasone (QVAR) 80 MCG/ACT inhaler Inhale 1 puff into the lungs 2 (two) times daily.   Yes Historical Provider, MD  diltiazem (TIAZAC) 360 MG 24 hr capsule Take 360 mg by mouth daily before breakfast.    Yes Historical Provider, MD  FLUoxetine  (PROZAC) 20 MG capsule Take 20 mg by mouth daily before breakfast.    Yes Historical Provider, MD  hydrOXYzine (ATARAX/VISTARIL) 25 MG tablet Take 25-100 mg by mouth 3 (three) times daily as needed. Takes 1 tablet 3 times daily as needed and 4 tablets at bedtime as needed for sleep   Yes Historical Provider, MD  metFORMIN (GLUCOPHAGE) 1000 MG tablet Take 1,000 mg by mouth 2 (two) times daily with a meal.   Yes Historical Provider, MD  mirtazapine (REMERON) 30 MG tablet Take 30 mg by mouth at bedtime.   Yes Historical Provider, MD  montelukast (SINGULAIR) 10 MG tablet Take 10 mg by mouth at bedtime.    Yes Historical Provider, MD  naproxen (NAPROSYN) 500 MG tablet Take 1 tablet (500 mg total) by mouth 2 (two) times daily. 03/05/15  Yes Robyn M Hess, PA-C  OVER THE COUNTER MEDICATION Take 1 tablet by mouth daily as needed. For allergies. Takes [employer] [facility]   Yes Historical Provider, MD  ramipril (ALTACE) 5 MG capsule Take 5 mg by mouth daily before breakfast.  Yes Historical Provider, MD  simvastatin (ZOCOR) 40 MG tablet Take 40 mg by mouth daily with breakfast.    Yes Historical Provider, MD  tiZANidine (ZANAFLEX) 2 MG tablet Take 2 mg by mouth every 8 (eight) hours as needed. For spasms   Yes Historical Provider, MD  traZODone (DESYREL) 150 MG tablet Take 300 mg by mouth at bedtime as needed. For sleep   Yes Historical Provider, MD  albuterol (PROVENTIL HFA;VENTOLIN HFA) 108 (90 BASE) MCG/ACT inhaler Inhale 2 puffs into the lungs every 4 (four) hours as needed. For wheezing    Historical Provider, MD  dicyclomine (BENTYL) 20 MG tablet Take 10-20 mg by mouth every 6 (six) hours as needed. For abdominal cramping    Historical Provider, MD  EPINEPHrine (EPIPEN) 0.3 mg/0.3 mL DEVI Inject 0.3 mg into the muscle once as needed. For bee stings    Historical Provider, MD  oxyCODONE-acetaminophen (PERCOCET) 7.5-325 MG per tablet Take 1 tablet by mouth every 4 (four) hours as needed. For pain    Historical  Provider, MD    Family History History reviewed. No pertinent family history.  Social History Social History  Substance Use Topics  . Smoking status: Never Smoker  . Smokeless tobacco: Never Used  . Alcohol use Yes     Comment: rare use     Allergies   Crestor [rosuvastatin calcium]; Almond meal; and Cozaar   Review of Systems Review of Systems  Constitutional: Negative.  Negative for fever.  HENT: Positive for congestion, postnasal drip and rhinorrhea.   Respiratory: Positive for cough.   Cardiovascular: Negative.   All other systems reviewed and are negative.    Physical Exam Triage Vital Signs ED Triage Vitals  Enc Vitals Group     BP 09/09/16 1552 135/89     Pulse Rate 09/09/16 1552 85     Resp 09/09/16 1552 12     Temp 09/09/16 1552 98.4 F (36.9 C)     Temp Source 09/09/16 1552 Oral     SpO2 09/09/16 1552 100 %     Weight --      Height --      Head Circumference --      Peak Flow --      Pain Score 09/09/16 1624 7     Pain Loc --      Pain Edu? --      Excl. in GC? --    No data found.   Updated Vital Signs BP 135/89 (BP Location: Left Arm)   Pulse 85   Temp 98.4 F (36.9 C) (Oral)   Resp 12   SpO2 100%   Visual Acuity Right Eye Distance:   Left Eye Distance:   Bilateral Distance:    Right Eye Near:   Left Eye Near:    Bilateral Near:     Physical Exam  Constitutional: She appears well-developed and well-nourished.  HENT:  Right Ear: External ear normal.  Left Ear: External ear normal.  Nose: Mucosal edema and rhinorrhea present. No sinus tenderness.  Mouth/Throat: Oropharynx is clear and moist.  Eyes: Pupils are equal, round, and reactive to light.  Neck: Normal range of motion. Neck supple.  Pulmonary/Chest: Effort normal and breath sounds normal.  Lymphadenopathy:    She has no cervical adenopathy.  Nursing note and vitals reviewed.    UC Treatments / Results  Labs (all labs ordered are listed, but only abnormal results  are displayed) Labs Reviewed - No data to display  EKG  EKG Interpretation None       Radiology No results found.  Procedures Procedures (including critical care time)  Medications Ordered in UC Medications - No data to display   Initial Impression / Assessment and Plan / UC Course  I have reviewed the triage vital signs and the nursing notes.  Pertinent labs & imaging results that were available during my care of the patient were reviewed by me and considered in my medical decision making (see chart for details).  Clinical Course      Final Clinical Impressions(s) / UC Diagnoses   Final diagnoses:  None    New Prescriptions New Prescriptions   No medications on file     Linna HoffJames D Jamee Keach, MD 09/09/16 [phone]

## 2016-09-27 NOTE — Telephone Encounter (Signed)
[place] She is going to be seeing her new PCP in 2 weeks. Advised pt to talk with her PCP in [place] to get name of a sleep doctor in her area and she can sign release of information to be sent to new doctors. Pt states she understands

## 2016-09-27 NOTE — Telephone Encounter (Signed)
[place] and would like to know who we would recommend for her to see for sleep. Please call [phone]

## 2016-10-05 NOTE — Progress Notes (Signed)
Called pt several times phone is not accepting calls sent form to scan until we can get a hold of the pt to let them know we have forms here for them to be paid for

## 2016-10-12 NOTE — Progress Notes (Signed)
Pt called back and gave a new number to reach her... Informed her the forms are 20.00 pt states she will call back in to pay when she gets the money

## 2016-10-19 ENCOUNTER — Encounter: Attending: Cardiovascular Disease | Primary: Family Medicine

## 2016-11-03 DIAGNOSIS — M25569 Pain in unspecified knee: Secondary | ICD-10-CM | POA: Diagnosis not present

## 2016-11-03 DIAGNOSIS — F419 Anxiety disorder, unspecified: Secondary | ICD-10-CM | POA: Diagnosis not present

## 2016-11-03 DIAGNOSIS — J309 Allergic rhinitis, unspecified: Secondary | ICD-10-CM | POA: Diagnosis not present

## 2016-11-03 DIAGNOSIS — Z23 Encounter for immunization: Secondary | ICD-10-CM | POA: Diagnosis not present

## 2016-11-03 DIAGNOSIS — Z87898 Personal history of other specified conditions: Secondary | ICD-10-CM | POA: Diagnosis not present

## 2016-11-03 DIAGNOSIS — D509 Iron deficiency anemia, unspecified: Secondary | ICD-10-CM | POA: Diagnosis not present

## 2016-11-03 DIAGNOSIS — M549 Dorsalgia, unspecified: Secondary | ICD-10-CM | POA: Diagnosis not present

## 2016-11-03 DIAGNOSIS — E1149 Type 2 diabetes mellitus with other diabetic neurological complication: Secondary | ICD-10-CM | POA: Diagnosis not present

## 2016-11-03 DIAGNOSIS — I10 Essential (primary) hypertension: Secondary | ICD-10-CM | POA: Diagnosis not present

## 2016-11-03 DIAGNOSIS — J4540 Moderate persistent asthma, uncomplicated: Secondary | ICD-10-CM | POA: Diagnosis not present

## 2016-11-03 DIAGNOSIS — Z1231 Encounter for screening mammogram for malignant neoplasm of breast: Secondary | ICD-10-CM | POA: Diagnosis not present

## 2016-11-03 DIAGNOSIS — E785 Hyperlipidemia, unspecified: Secondary | ICD-10-CM | POA: Diagnosis not present

## 2016-11-09 DIAGNOSIS — F419 Anxiety disorder, unspecified: Secondary | ICD-10-CM | POA: Diagnosis not present

## 2016-11-09 DIAGNOSIS — F39 Unspecified mood [affective] disorder: Secondary | ICD-10-CM | POA: Diagnosis not present

## 2016-11-09 NOTE — Progress Notes (Signed)
Phone not accepting calls, sent to scan

## 2016-11-15 DIAGNOSIS — J329 Chronic sinusitis, unspecified: Secondary | ICD-10-CM | POA: Diagnosis not present

## 2016-11-25 ENCOUNTER — Encounter: Attending: Physician Assistant | Primary: Family Medicine

## 2016-11-26 ENCOUNTER — Emergency Department (HOSPITAL_COMMUNITY)
Admission: EM | Admit: 2016-11-26 | Discharge: 2016-11-26 | Disposition: A | Discharge status: Home / Self Care | Payer: Medicare (Managed Care) | Attending: Emergency Medicine | Admitting: Emergency Medicine

## 2016-11-26 ENCOUNTER — Encounter (HOSPITAL_COMMUNITY): Payer: Self-pay | Admitting: Emergency Medicine

## 2016-11-26 DIAGNOSIS — Z79899 Other long term (current) drug therapy: Secondary | ICD-10-CM | POA: Diagnosis not present

## 2016-11-26 DIAGNOSIS — G8929 Other chronic pain: Secondary | ICD-10-CM | POA: Diagnosis not present

## 2016-11-26 DIAGNOSIS — J45909 Unspecified asthma, uncomplicated: Secondary | ICD-10-CM | POA: Insufficient documentation

## 2016-11-26 DIAGNOSIS — N189 Chronic kidney disease, unspecified: Secondary | ICD-10-CM | POA: Insufficient documentation

## 2016-11-26 DIAGNOSIS — M549 Dorsalgia, unspecified: Secondary | ICD-10-CM | POA: Diagnosis not present

## 2016-11-26 DIAGNOSIS — I129 Hypertensive chronic kidney disease with stage 1 through stage 4 chronic kidney disease, or unspecified chronic kidney disease: Secondary | ICD-10-CM | POA: Insufficient documentation

## 2016-11-26 DIAGNOSIS — M25561 Pain in right knee: Secondary | ICD-10-CM | POA: Diagnosis not present

## 2016-11-26 DIAGNOSIS — M25562 Pain in left knee: Secondary | ICD-10-CM | POA: Diagnosis not present

## 2016-11-26 DIAGNOSIS — Z7984 Long term (current) use of oral hypoglycemic drugs: Secondary | ICD-10-CM | POA: Diagnosis not present

## 2016-11-26 DIAGNOSIS — E1122 Type 2 diabetes mellitus with diabetic chronic kidney disease: Secondary | ICD-10-CM | POA: Diagnosis not present

## 2016-11-26 MED ORDER — HYDROCODONE-ACETAMINOPHEN 5-325 MG PO TABS
2.0000 | ORAL_TABLET | Freq: Once | ORAL | Status: AC
  Administered 2016-11-26: 2 via ORAL
  Filled 2016-11-26: qty 2

## 2016-11-26 NOTE — ED Provider Notes (Signed)
[facility]-EMERGENCY DEPT Provider Note   CSN: [id number] Arrival date & time: 11/26/16  1248  By signing my name below, I, Chelsea SalisburyJoshua Benson, attest that this documentation has been prepared under the direction and in the presence of non-physician practitioner, Roxy Horsemanobert Lequita Meadowcroft, PA-C. Electronically Signed: Nelwyn SalisburyJoshua Benson, Scribe. 11/26/2016. 5:04 PM.  History   Chief Complaint Chief Complaint  Patient presents with  . Knee Pain  . Back Pain   The history is provided by the patient. No language interpreter was used.   Chelsea Benson is a 65 y.o. female who presents to the Emergency Department with a chief complaint of chronic constant worsening bilateral knee pain. She states her pain began after a pshx of meniscus repair two years ago but has worsened over the past week. Pt describes her pain as an 8/10 pain that radiates to the back of her knees exacerbated by walking. She has used a knee brace and at-home tramadol with no relief. Pt reports associated swelling to her knees and legs. She denies any other symptoms  Past Medical History:  Diagnosis Date  . Anxiety   . Arthritis    DDD, spondylosis  . Asthma   . Chronic kidney disease    renal calculi  . Diabetes mellitus without complication (HCC)   . H/O echocardiogram    states she [facility] echo [facility] a couple of yrs. ago  . Headache(784.0)    sinus related   . Hypertension   . Mental disorder   . Pneumonia    [facility], Jan./ 2013- treated & sent home     There are no active problems to display for this patient.   Past Surgical History:  Procedure Laterality Date  . ABDOMINAL HYSTERECTOMY    . BACK SURGERY     2012- lumbar fusion  . BREAST SURGERY     L cyst removed - 1972  . BUNIONECTOMY     L foot  . calf -R- cyst removed    . CARPAL TUNNEL RELEASE     both hands   . great toe     removed arthritis   . NASAL SINUS SURGERY    . SHOULDER ARTHROSCOPY     R shoulder- RCR  .  TRIGGER FINGER RELEASE     L thumb    OB History    No data available       Home Medications    Prior to Admission medications   Medication Sig Start Date End Date Taking? Authorizing Provider  albuterol (PROVENTIL HFA;VENTOLIN HFA) 108 (90 BASE) MCG/ACT inhaler Inhale 2 puffs into the lungs every 4 (four) hours as needed. For wheezing    Historical Provider, MD  azithromycin (ZITHROMAX Z-PAK) 250 MG tablet Take as directed on pack 09/09/16   [name], MD  beclomethasone (QVAR) 80 MCG/ACT inhaler Inhale 1 puff into the lungs 2 (two) times daily.    Historical Provider, MD  dicyclomine (BENTYL) 20 MG tablet Take 10-20 mg by mouth every 6 (six) hours as needed. For abdominal cramping    Historical Provider, MD  diltiazem (TIAZAC) 360 MG 24 hr capsule Take 360 mg by mouth daily before breakfast.     Historical Provider, MD  EPINEPHrine (EPIPEN) 0.3 mg/0.3 mL DEVI Inject 0.3 mg into the muscle once as needed. For bee stings    Historical Provider, MD  FLUoxetine (PROZAC) 20 MG capsule Take 20 mg by mouth daily before breakfast.     Historical  Provider, MD  hydrOXYzine (ATARAX/VISTARIL) 25 MG tablet Take 25-100 mg by mouth 3 (three) times daily as needed. Takes 1 tablet 3 times daily as needed and 4 tablets at bedtime as needed for sleep    Historical Provider, MD  ipratropium (ATROVENT) 0.06 % nasal spray Place 2 sprays into both nostrils 4 (four) times daily. 09/09/16   Linna Hoff, MD  metFORMIN (GLUCOPHAGE) 1000 MG tablet Take 1,000 mg by mouth 2 (two) times daily with a meal.    Historical Provider, MD  mirtazapine (REMERON) 30 MG tablet Take 30 mg by mouth at bedtime.    Historical Provider, MD  montelukast (SINGULAIR) 10 MG tablet Take 10 mg by mouth at bedtime.     Historical Provider, MD  naproxen (NAPROSYN) 500 MG tablet Take 1 tablet (500 mg total) by mouth 2 (two) times daily. 03/05/15   Robyn M Hess, PA-C  OVER THE COUNTER MEDICATION Take 1 tablet by mouth daily as needed. For  allergies. Takes [employer] Allergy    Historical Provider, MD  oxyCODONE-acetaminophen (PERCOCET) 7.5-325 MG per tablet Take 1 tablet by mouth every 4 (four) hours as needed. For pain    Historical Provider, MD  ramipril (ALTACE) 5 MG capsule Take 5 mg by mouth daily before breakfast.     Historical Provider, MD  simvastatin (ZOCOR) 40 MG tablet Take 40 mg by mouth daily with breakfast.     Historical Provider, MD  tiZANidine (ZANAFLEX) 2 MG tablet Take 2 mg by mouth every 8 (eight) hours as needed. For spasms    Historical Provider, MD  traZODone (DESYREL) 150 MG tablet Take 300 mg by mouth at bedtime as needed. For sleep    Historical Provider, MD    Family History No family history on file.  Social History Social History  Substance Use Topics  . Smoking status: Never Smoker  . Smokeless tobacco: Never Used  . Alcohol use Yes     Comment: rare use     Allergies   Crestor [rosuvastatin calcium]; Almond meal; and Cozaar   Review of Systems Review of Systems  Cardiovascular: Positive for leg swelling.  Musculoskeletal: Positive for arthralgias and joint swelling.     Physical Exam Updated Vital Signs BP 118/77   Pulse 89   Temp 98.3 F (36.8 C) (Oral)   Resp 16   SpO2 96%   Physical Exam Physical Exam  Constitutional: Pt appears well-developed and well-nourished. No distress.  HENT:  Head: Normocephalic and atraumatic.  Eyes: Conjunctivae are normal.  Neck: Normal range of motion.  Cardiovascular: Normal rate, regular rhythm and intact distal pulses.   Capillary refill < 3 sec  Pulmonary/Chest: Effort normal and breath sounds normal.  Musculoskeletal: Pt exhibits tenderness to palpation over bilateral anterior knees, no bony abnormality or deformity, mild swelling bilaterally, no erythema or evidence of abscess or septic joint. ROM: 5/5 bilateral Neurological: Pt  is alert. Coordination normal.  Sensation 5/5 bilateral Strength 5/5 bilateral Skin: Skin is warm and  dry. Pt is not diaphoretic.  No tenting of the skin  Psychiatric: Pt has a normal mood and affect.  Nursing note and vitals reviewed.   ED Treatments / Results  DIAGNOSTIC STUDIES:  Oxygen Saturation is 96% on RA, normal by my interpretation.    COORDINATION OF CARE:  5:38 PM Discussed treatment plan with pt at bedside which includes pain medication in the ED and continuation of using her brace and pt agreed to plan.  Labs (all labs ordered are  listed, but only abnormal results are displayed) Labs Reviewed - No data to display  EKG  EKG Interpretation None       Radiology No results found.  Procedures Procedures (including critical care time)  Medications Ordered in ED Medications - No data to display   Initial Impression / Assessment and Plan / ED Course  I have reviewed the triage vital signs and the nursing notes.  Pertinent labs & imaging results that were available during my care of the patient were reviewed by me and considered in my medical decision making (see chart for details).  Clinical Course      Patient with chronic knee pain.  Sees Dr. Dion SaucierLandau and has an appointment coming up.  States that she needs something stronger for pain.  No new injuries. No evidence os septic join, doubt DVT as symptoms are bilateral and chronic. Pt advised to follow up with orthopedics. Conservative therapy recommended and discussed. Patient will be discharged home & is agreeable with above plan. Returns precautions discussed. Pt appears safe for discharge.  Final Clinical Impressions(s) / ED Diagnoses   Final diagnoses:  Other chronic pain  Chronic pain of both knees    New Prescriptions Discharge Medication List as of 11/26/2016  5:42 PM     I personally performed the services described in this documentation, which was scribed in my presence. The recorded information has been reviewed and is accurate.       Roxy HorsemanRobert Jazmynn Pho, PA-C 11/26/16 1815    Nira ConnPedro Eduardo  Cardama, MD 11/27/16 [phone]

## 2016-11-26 NOTE — ED Triage Notes (Signed)
Pt states "ive been having some knee pain and some back pain.' Pt hx of arthritis in knee. Pt has chronic back pain.

## 2016-12-01 DIAGNOSIS — J069 Acute upper respiratory infection, unspecified: Secondary | ICD-10-CM | POA: Diagnosis not present

## 2016-12-01 DIAGNOSIS — M549 Dorsalgia, unspecified: Secondary | ICD-10-CM | POA: Diagnosis not present

## 2016-12-01 DIAGNOSIS — Z79899 Other long term (current) drug therapy: Secondary | ICD-10-CM | POA: Diagnosis not present

## 2016-12-07 NOTE — Telephone Encounter (Signed)
Notes are in the system from each time I tried to contact pt or anything pertaining to the form.. Also called and lvm for pt.. Form was not paid for and has been scanned in awaiting pt to call and pay for form .Marland Kitchen. There is not charge or any collection of a 20.00 payment on the pt chart

## 2016-12-07 NOTE — Telephone Encounter (Signed)
PATIENT STATES OUR OFFICE WAS SUPPOSE TO MAIL HER DISABILITY FORM TO HER WHEN IT WAS COMPLETED.  SHE SAID SHE WAS TOLD OVER THE PHONE THAT SHE PAID THE $20 ON 10/05/16.    PLEASE ADVISE

## 2016-12-07 NOTE — Telephone Encounter (Signed)
Tanneca,  Can you check into this please.

## 2016-12-27 DIAGNOSIS — E876 Hypokalemia: Secondary | ICD-10-CM | POA: Diagnosis not present

## 2017-01-13 DIAGNOSIS — F39 Unspecified mood [affective] disorder: Secondary | ICD-10-CM | POA: Diagnosis not present

## 2017-01-13 DIAGNOSIS — R6889 Other general symptoms and signs: Secondary | ICD-10-CM | POA: Diagnosis not present

## 2017-01-20 DIAGNOSIS — M25561 Pain in right knee: Secondary | ICD-10-CM | POA: Diagnosis not present

## 2017-01-20 DIAGNOSIS — T23209A Burn of second degree of unspecified hand, unspecified site, initial encounter: Secondary | ICD-10-CM | POA: Diagnosis not present

## 2017-01-20 DIAGNOSIS — E1149 Type 2 diabetes mellitus with other diabetic neurological complication: Secondary | ICD-10-CM | POA: Diagnosis not present

## 2017-01-20 DIAGNOSIS — Z7984 Long term (current) use of oral hypoglycemic drugs: Secondary | ICD-10-CM | POA: Diagnosis not present

## 2017-01-20 DIAGNOSIS — M545 Low back pain: Secondary | ICD-10-CM | POA: Diagnosis not present

## 2017-01-20 DIAGNOSIS — R6889 Other general symptoms and signs: Secondary | ICD-10-CM | POA: Diagnosis not present

## 2017-01-20 DIAGNOSIS — M1990 Unspecified osteoarthritis, unspecified site: Secondary | ICD-10-CM | POA: Diagnosis not present

## 2017-01-24 DIAGNOSIS — J069 Acute upper respiratory infection, unspecified: Secondary | ICD-10-CM | POA: Diagnosis not present

## 2017-01-24 DIAGNOSIS — R6889 Other general symptoms and signs: Secondary | ICD-10-CM | POA: Diagnosis not present

## 2017-02-28 DIAGNOSIS — M1711 Unilateral primary osteoarthritis, right knee: Secondary | ICD-10-CM | POA: Diagnosis not present

## 2017-03-07 DIAGNOSIS — M1711 Unilateral primary osteoarthritis, right knee: Secondary | ICD-10-CM | POA: Diagnosis not present

## 2017-03-09 DIAGNOSIS — R6889 Other general symptoms and signs: Secondary | ICD-10-CM | POA: Diagnosis not present

## 2017-03-09 DIAGNOSIS — R921 Mammographic calcification found on diagnostic imaging of breast: Secondary | ICD-10-CM | POA: Diagnosis not present

## 2017-03-10 DIAGNOSIS — G4733 Obstructive sleep apnea (adult) (pediatric): Secondary | ICD-10-CM | POA: Diagnosis not present

## 2017-03-10 DIAGNOSIS — R6889 Other general symptoms and signs: Secondary | ICD-10-CM | POA: Diagnosis not present

## 2017-03-10 DIAGNOSIS — L2082 Flexural eczema: Secondary | ICD-10-CM | POA: Diagnosis not present

## 2017-03-10 DIAGNOSIS — M62838 Other muscle spasm: Secondary | ICD-10-CM | POA: Diagnosis not present

## 2017-03-13 DIAGNOSIS — R6889 Other general symptoms and signs: Secondary | ICD-10-CM | POA: Diagnosis not present

## 2017-03-13 DIAGNOSIS — M5137 Other intervertebral disc degeneration, lumbosacral region: Secondary | ICD-10-CM | POA: Diagnosis not present

## 2017-03-17 ENCOUNTER — Other Ambulatory Visit: Payer: Self-pay | Admitting: Neurosurgery

## 2017-03-17 DIAGNOSIS — M5137 Other intervertebral disc degeneration, lumbosacral region: Secondary | ICD-10-CM

## 2017-03-27 ENCOUNTER — Other Ambulatory Visit: Payer: Self-pay

## 2017-03-29 ENCOUNTER — Ambulatory Visit
Admission: RE | Admit: 2017-03-29 | Discharge: 2017-03-29 | Disposition: A | Discharge status: Home / Self Care | Payer: Medicare HMO | Source: Ambulatory Visit | Attending: Neurosurgery | Admitting: Neurosurgery

## 2017-03-29 ENCOUNTER — Encounter: Payer: Self-pay | Admitting: Allergy

## 2017-03-29 ENCOUNTER — Ambulatory Visit (INDEPENDENT_AMBULATORY_CARE_PROVIDER_SITE_OTHER): Payer: Medicare HMO | Admitting: Allergy

## 2017-03-29 VITALS — BP 110/80 | HR 93 | Temp 98.0°F | Resp 18 | Ht 64.76 in | Wt 181.8 lb

## 2017-03-29 DIAGNOSIS — J4540 Moderate persistent asthma, uncomplicated: Secondary | ICD-10-CM

## 2017-03-29 DIAGNOSIS — M5137 Other intervertebral disc degeneration, lumbosacral region: Secondary | ICD-10-CM

## 2017-03-29 DIAGNOSIS — Z91018 Allergy to other foods: Secondary | ICD-10-CM | POA: Diagnosis not present

## 2017-03-29 DIAGNOSIS — H1010 Acute atopic conjunctivitis, unspecified eye: Secondary | ICD-10-CM

## 2017-03-29 DIAGNOSIS — J309 Allergic rhinitis, unspecified: Secondary | ICD-10-CM

## 2017-03-29 DIAGNOSIS — L2089 Other atopic dermatitis: Secondary | ICD-10-CM

## 2017-03-29 DIAGNOSIS — M545 Low back pain: Secondary | ICD-10-CM | POA: Diagnosis not present

## 2017-03-29 MED ORDER — ALBUTEROL SULFATE (2.5 MG/3ML) 0.083% IN NEBU: 2.5000 mg | INHALATION_SOLUTION | RESPIRATORY_TRACT | 1 refills | Status: DC | PRN

## 2017-03-29 MED ORDER — TRIAMCINOLONE ACETONIDE 0.1 % EX OINT: 1.0000 "application " | TOPICAL_OINTMENT | Freq: Two times a day (BID) | CUTANEOUS | 0 refills | Status: DC

## 2017-03-29 MED ORDER — BUDESONIDE-FORMOTEROL FUMARATE 160-4.5 MCG/ACT IN AERO: 2.0000 | INHALATION_SPRAY | Freq: Two times a day (BID) | RESPIRATORY_TRACT | 5 refills | Status: DC

## 2017-03-29 MED ORDER — EPINEPHRINE 0.3 MG/0.3ML IJ SOAJ: INTRAMUSCULAR | 1 refills | Status: DC

## 2017-03-29 MED ORDER — OLOPATADINE HCL 0.2 % OP SOLN: 1.0000 [drp] | Freq: Every day | OPHTHALMIC | 3 refills | Status: DC

## 2017-03-29 NOTE — Progress Notes (Signed)
New Patient Note  RE: Chelsea Benson MRN: [id number] DOB: May 27, 1951 Date of Office Visit: 03/29/2017  Referring provider: Sigmund Hazel, MD Primary care provider: Neldon Labella, MD  Chief Complaint: Allergy management  History of present illness: Chelsea Benson is a 66 y.o. female presenting today for consultation for allergies and asthma. She recently moved from [place] back to [place] in October 2017 and she would like to establish allergy care.  She was seen an allergist while in [place] however she reports her allergy symptoms were not as bad as they are here in [place].  Prior to moving to [place] she was seen ENT with [facility] back in 2015 and was on allergen immunotherapy.  She reports she did about 1-1/2 years of immunotherapy. She didn't feel the need to continue this while in [place] as her symptoms were improved.  She has had several allergy testing done in the past 5 years including one set through [facility] that showed sensitivities to grasses, trees, weeds, mold, dust mite, dog and cat.  She had testing done well in [place] back in April 2017 that showed sensitivity to weeds, trees, grass, dust mite, molds, cats and was found to have sensitivity to Halibut.    Since she has moved back here with pollen season her symptoms have been a lot worse. She reports she avoids having to go outside.   She has been having issues with watery, itchy eyes now is her biggest concern.   She has used zaditor in past with good relief.  She also has nasal congestion and sneezing and worsening of her asthma symptoms.  She reports Zyrtec and Claritin has never worked for her.  She has taken allegra in the past which has been helpful.  She takes singulair take a night and Azelastine which she takes 2 sprays each nostril twice a day. She currently is not interested in allergen immunotherapy as she reports she only having surgery over the summer and no she would be missing  many doses and thus does not want to proceed with this at this time.       She has adult-onset asthma and does not recall how old she was when diagnosed.  Her asthma symptoms are triggered by her allergies.  She has been using albuterol for past week daily.  She also reports nighttime awakenings several nights a week.  She uses albuterol for cough, wheeze as well as chest tightness with good relief of symptoms.   She takes Spiriva daily.   She had double PNA last August 2017 and was hospitalized and reports her asthma medications was changed.   She has been on Pulmicort, Qvar and symbicort in the past.   She reports her Qvar was changed to spiriva last year.  She denies any history of intubations.   She eats fish without issue.  She avoids almonds and she reports tongue swelling.  She eats other tree nuts and Peanut without issue. She needs another EpiPen.  She does have eczema mostly in the creases.  She has hydrocortisone cream that she uses as needed.  She lotions with vaseline.    Review of systems: Review of Systems  Constitutional: Negative for chills, fever and malaise/fatigue.  HENT: Positive for congestion and ear pain. Negative for ear discharge, nosebleeds, sinus pain and tinnitus.   Eyes: Positive for discharge and redness. Negative for blurred vision, photophobia and pain.  Respiratory: Positive for cough, shortness of breath and wheezing. Negative for  sputum production.   Cardiovascular: Negative for chest pain.  Gastrointestinal: Negative for abdominal pain, heartburn, nausea and vomiting.  Musculoskeletal: Positive for joint pain.  Skin: Positive for itching and rash.  Neurological: Negative for headaches.    All other systems negative unless noted above in HPI  Past medical history: Past Medical History:  Diagnosis Date  . Allergic rhinoconjunctivitis   . Anxiety   . Arthritis    DDD, spondylosis  . Asthma   . Chronic kidney disease    renal calculi  . Diabetes  mellitus without complication (HCC)   . Headache(784.0)    sinus related   . Hypertension   . Mental disorder     Past surgical history: Past Surgical History:  Procedure Laterality Date  . ABDOMINAL HYSTERECTOMY    . BACK SURGERY     2012- lumbar fusion  . BREAST SURGERY     L cyst removed - 1972  . BUNIONECTOMY     L foot  . calf -R- cyst removed    . CARPAL TUNNEL RELEASE     both hands   . great toe     removed arthritis   . NASAL SINUS SURGERY    . SHOULDER ARTHROSCOPY     R shoulder- RCR  . TRIGGER FINGER RELEASE     L thumb    Family history:  Family History  Problem Relation Age of Onset  . Allergies Neg Hx   . Asthma Neg Hx   . Eczema Neg Hx   . Immunodeficiency Neg Hx     Social history:  She lives in a home with out carpeting with electric heating and window cooling without pets. There is no concern for water damage, mildew, roaches in the home. She does not currently work as she is disabled. She has no smoking history.   Medication List: Allergies as of 03/29/2017      Reactions   Crestor [rosuvastatin Calcium] Other (See Comments)   Makes her heart beat really fast.   Almond Meal    Tongue itches   Almond Oil    Itching of tongue   Cozaar Cough   Other    Cough      Medication List       Accurate as of 03/29/17  4:30 PM. Always use your most recent med list.          albuterol 108 (90 Base) MCG/ACT inhaler Commonly known as:  PROVENTIL HFA;VENTOLIN HFA Inhale 2 puffs into the lungs every 4 (four) hours as needed. For wheezing   aspirin EC 81 MG tablet Frequency:   Dosage:0.0     Instructions:  Note:   azelastine 0.1 % nasal spray Commonly known as:  ASTELIN Frequency:   Dosage:0.0     Instructions:  Note:   azithromycin 250 MG tablet Commonly known as:  ZITHROMAX Z-PAK Take as directed on pack   busPIRone 15 MG tablet Commonly known as:  BUSPAR   dicyclomine 20 MG tablet Commonly known as:  BENTYL Take 10-20 mg by mouth  every 6 (six) hours as needed. For abdominal cramping   diltiazem 360 MG 24 hr capsule Commonly known as:  TIAZAC Take 360 mg by mouth daily before breakfast.   diltiazem 360 MG 24 hr tablet Commonly known as:  CARDIZEM LA Frequency:   Dosage:0.0     Instructions:  Note:   diltiazem 120 MG 24 hr capsule Commonly known as:  CARDIZEM CD   FLUoxetine 20 MG capsule Commonly  known as:  PROZAC Take 20 mg by mouth daily before breakfast.   hydrochlorothiazide 25 MG tablet Commonly known as:  HYDRODIURIL TAKE 1 TABLET BY MOUTH DAILY EVERY MORNING   hydrocortisone cream 1 % APPLY 1 APPLICATION TO AFFECTED AREA TWICE A DAY EXTERNALLY   hydrOXYzine 25 MG tablet Commonly known as:  ATARAX/VISTARIL Take 25-100 mg by mouth 3 (three) times daily as needed. Takes 1 tablet 3 times daily as needed and 4 tablets at bedtime as needed for sleep   ibuprofen 800 MG tablet Commonly known as:  ADVIL,MOTRIN   ipratropium 0.06 % nasal spray Commonly known as:  ATROVENT Place 2 sprays into both nostrils 4 (four) times daily.   ipratropium-albuterol 0.5-2.5 (3) MG/3ML Soln Commonly known as:  DUONEB Frequency:   Dosage:0.0     Instructions:  Note:   JANUMET 50-1000 MG tablet Generic drug:  sitaGLIPtin-metformin   ketotifen 0.025 % ophthalmic solution Commonly known as:  ZADITOR Frequency:   Dosage:0.0     Instructions:  Note:   KLOR-CON M20 20 MEQ tablet Generic drug:  potassium chloride SA   metFORMIN 1000 MG tablet Commonly known as:  GLUCOPHAGE Take 1,000 mg by mouth 2 (two) times daily with a meal.   mirtazapine 30 MG tablet Commonly known as:  REMERON Take 30 mg by mouth at bedtime.   montelukast 10 MG tablet Commonly known as:  SINGULAIR Take 10 mg by mouth at bedtime.   naproxen 500 MG tablet Commonly known as:  NAPROSYN Take 1 tablet (500 mg total) by mouth 2 (two) times daily.   ONE TOUCH LANCETS Misc ONE TOUCH DELICATE LANCETS 33G USE BID Dx 250.00   OVER THE COUNTER  MEDICATION Take 1 tablet by mouth daily as needed. For allergies. Takes [employer] Allergy   OXcarbazepine 150 MG tablet Commonly known as:  TRILEPTAL   oxybutynin 10 MG 24 hr tablet Commonly known as:  DITROPAN-XL TAKE 1 TABLET BY MOUTH DAILY   oxyCODONE-acetaminophen 10-325 MG tablet Commonly known as:  PERCOCET Take by mouth.   pravastatin 20 MG tablet Commonly known as:  PRAVACHOL   pregabalin 75 MG capsule Commonly known as:  LYRICA Take 75 mg by mouth.   LYRICA 100 MG capsule Generic drug:  pregabalin   ramipril 5 MG capsule Commonly known as:  ALTACE Take 5 mg by mouth daily before breakfast.   simvastatin 40 MG tablet Commonly known as:  ZOCOR Take 40 mg by mouth daily with breakfast.   SPIRIVA RESPIMAT 1.25 MCG/ACT Aers Generic drug:  Tiotropium Bromide Monohydrate   tiZANidine 2 MG tablet Commonly known as:  ZANAFLEX Take 2 mg by mouth every 8 (eight) hours as needed. For spasms   traMADol 50 MG tablet Commonly known as:  ULTRAM   traZODone 150 MG tablet Commonly known as:  DESYREL Take 300 mg by mouth at bedtime as needed. For sleep   zolpidem 10 MG tablet Commonly known as:  AMBIEN       Known medication allergies: Allergies  Allergen Reactions  . Crestor [Rosuvastatin Calcium] Other (See Comments)    Makes her heart beat really fast.  . Almond Meal     Tongue itches  . Almond Oil     Itching of tongue  . Cozaar Cough  . Other     Cough     Physical examination: Blood pressure 110/80, pulse 93, temperature 98 F (36.7 C), temperature source Oral, resp. rate 18, height 5' 4.76" (1.645 m), weight 181 lb 12.8 oz (  82.5 kg), SpO2 98 %.  General: Alert, interactive, in no acute distress. HEENT: TMs pearly gray, turbinates moderately edematous with clear discharge, post-pharynx non erythematous. Neck: Supple without lymphadenopathy. Lungs: Mildly decreased breath sounds bilaterally without wheezing, rhonchi or rales. {no increased work of  breathing.  Improved aeration following DuoNeb. CV: Normal S1, S2 without murmurs. Abdomen: Nondistended, nontender. Skin: Warm and dry, without lesions or rashes. Extremities:  No clubbing, cyanosis or edema. Neuro:   Grossly intact.  Diagnositics/Labs:  Spirometry: FEV1: 1.03L  50%, FVC: 1.56L  60%, she had a 52% increase in her FEV1 following DuoNeb administration. FEV1 improved to 1.55 L or 76%   Assessment and plan: Allergic rhinoconjunctivitis     - continue singulair  -- take at bedtime     - continue Azelastine nasal spray 2 sprays each nostril twice a day     - resume Allegra  daily use     - use Olopatadine eye drop 1 drop each eye daily as needed for watery, itchy, red eyes     - allergen avoidance measures discussed  Asthma, Moderate persistent      - start symbicort 2 puffs twice a day      - continue spiriva 2 puffs daily -- take midday      - singulair as above      - albuterol 2 puffs every 4-6 hours as needed for cough, wheeze, shortness of breath or chest tightness.  Monitor frequency of albuterol use      - Consider obtaining CBC with differential in the future  Eczema     - moisturize daily with vaseline     - use hydrocortisone cream as needed for flares twice a day.  If hydrocortisone is not effective use triamcinolone 0.1% twice a day until flare is improved  Nut allergy/Pollen food allergy syndrome    - continue avoidance of almond    - not concerned for fish allergy    - have access to Epipen for as needed use in case of allergic reaction  Follow-up 4 months or sooner if needed   I appreciate the opportunity to take part in Chelsea Benson's care. Please do not hesitate to contact me with questions.  Sincerely,   Margo Aye, MD Allergy/Immunology [facility]

## 2017-03-29 NOTE — Patient Instructions (Addendum)
Allergies     - continue singulair  -- take at bedtime     - continue Azelastine nasal spray 2 sprays each nostril twice a day     - resume Allegra  daily use     - use Olopatadine eye drop 1 drop each eye daily as needed for watery, itchy, red eyes     - allergen avoidance measures discussed  Asthma      - start symbicort 2 puffs twice a day      - continue spiriva 2 puffs daily -- take midday      - singulair as above      - albuterol 2 puffs every 4-6 hours as needed for cough, wheeze, shortness of breath or chest tightness.  Monitor frequency of albuterol use  Eczema     - moisturize daily with vaseline     - use hydrocortisone cream as needed for flares twice a day.  If hydrocortisone is not effective use triamcinolone 0.1% twice a day until flare is improved  Nut allergy/pollen food allergy syndrome    - continue avoidance of almond    - not concerned for fish allergy    - have access to Epipen for as needed use in case of allergic reaction  Follow-up 4 months or sooner if needed

## 2017-03-31 DIAGNOSIS — I10 Essential (primary) hypertension: Secondary | ICD-10-CM | POA: Diagnosis not present

## 2017-03-31 DIAGNOSIS — M4807 Spinal stenosis, lumbosacral region: Secondary | ICD-10-CM | POA: Diagnosis not present

## 2017-03-31 DIAGNOSIS — Z6830 Body mass index (BMI) 30.0-30.9, adult: Secondary | ICD-10-CM | POA: Diagnosis not present

## 2017-03-31 DIAGNOSIS — M5137 Other intervertebral disc degeneration, lumbosacral region: Secondary | ICD-10-CM | POA: Diagnosis not present

## 2017-04-06 DIAGNOSIS — M654 Radial styloid tenosynovitis [de Quervain]: Secondary | ICD-10-CM | POA: Diagnosis not present

## 2017-04-06 NOTE — Telephone Encounter (Signed)
Pt called this morning wanting to pay on a form. I informed patient that we did not have any forms for her here to be completed. Pt then states that Dr Wilford CornerArora has had these "forms" for months, and that they are in regards to her needing something filled out stating that she needs to ride the bus. Pt was not very clear at all as to what she is needing, in fact she sounded confused. Please advise.

## 2017-04-06 NOTE — Progress Notes (Signed)
lvm for pt to call back and pay.. Pt form is in the tray on the back counter ready for her to pay

## 2017-04-06 NOTE — Progress Notes (Signed)
J Riddick at 10/05/16 1423   Status: Signed          Called pt several times phone is not accepting calls sent form to scan until we can get a hold of the pt to let them know we have forms here for them to be paid for

## 2017-04-06 NOTE — Progress Notes (Signed)
Pt called to make a phone pymnt for her form. Gaylyn RongKris rossetti at [employer] accepted the call for pymnt.

## 2017-04-06 NOTE — Telephone Encounter (Signed)
No. I told kris when she received the call I didn't and to have the pt bring in another one

## 2017-04-07 ENCOUNTER — Telehealth: Payer: Self-pay | Admitting: Allergy

## 2017-04-07 DIAGNOSIS — Z91018 Allergy to other foods: Secondary | ICD-10-CM

## 2017-04-07 DIAGNOSIS — F39 Unspecified mood [affective] disorder: Secondary | ICD-10-CM | POA: Diagnosis not present

## 2017-04-07 MED ORDER — EPINEPHRINE 0.3 MG/0.3ML IJ SOAJ: INTRAMUSCULAR | 1 refills | Status: DC

## 2017-04-07 NOTE — Telephone Encounter (Signed)
Pt called and said the epi-pen was not called into her pharmacy [facility] on [address]  [phone]

## 2017-04-07 NOTE — Telephone Encounter (Signed)
Script resent into pharmacy.

## 2017-04-12 ENCOUNTER — Telehealth: Payer: Self-pay

## 2017-04-12 NOTE — Telephone Encounter (Signed)
Patient called back and stated she has an epi-pen from [facility].

## 2017-04-12 NOTE — Telephone Encounter (Signed)
Received a fax from [facility] regarding the epi-pen rx. Left pt a message to provide a new pharmacy to send the rx to.

## 2017-04-13 DIAGNOSIS — M654 Radial styloid tenosynovitis [de Quervain]: Secondary | ICD-10-CM | POA: Diagnosis not present

## 2017-04-14 DIAGNOSIS — J040 Acute laryngitis: Secondary | ICD-10-CM | POA: Diagnosis not present

## 2017-04-18 ENCOUNTER — Telehealth: Payer: Self-pay

## 2017-04-18 NOTE — Telephone Encounter (Signed)
WUJ:[id number]:Patient called and stated that her lip is swollen yesterday and today. Patient said when she went outside to sweep off the patio she came back in with her lip swollen. She wanted to know what else she could take. She has put Vaseline on her lip and she ate some ice cream for the swelling to go down yesterday. Then she went back outside today and the same lip selling happened again.She is taking Allegra, I also informed her that she can take Benadryl to help. I also made sure she had her epi-pen available.

## 2017-04-24 DIAGNOSIS — I10 Essential (primary) hypertension: Secondary | ICD-10-CM | POA: Diagnosis not present

## 2017-04-24 DIAGNOSIS — G4733 Obstructive sleep apnea (adult) (pediatric): Secondary | ICD-10-CM | POA: Diagnosis not present

## 2017-04-26 NOTE — Telephone Encounter (Signed)
As long as she is not having any difficulty breathing or swallowing when she is having the swelling recommend when she has the lip swelling that she can take another Allegra tablet and if she has access to ranitidine or famotidine she can take either of these medications alongside her Allegra to provide more antihistamine coverage. Please review symptoms to use her EpiPen.

## 2017-04-26 NOTE — Telephone Encounter (Signed)
Spoke to patient to check to see how she is doing. She stated that she is doing just fine.

## 2017-05-08 DIAGNOSIS — M5416 Radiculopathy, lumbar region: Secondary | ICD-10-CM | POA: Diagnosis not present

## 2017-05-10 NOTE — Pre-Procedure Instructions (Signed)
Chelsea Benson  05/10/2017      [facility] [id number] - [place], [place] - [address] AT [employer] [address] [facility] [address] [place] [address] Phone: [phone] Fax: [phone]  [facility] [id number] - [place], [place] - [address] [place] [id number] Phone: [phone] Fax: [phone]    Your procedure is scheduled on May 22, 2017.  [facility] to [facility] Admitting at 745 AM.  Call this number if you have problems the morning of surgery:[phone]   Remember:  Do not eat food or drink liquids after midnight.  Take these medicines the morning of surgery with A SIP OF WATER albuterol (proventil) inhaler, spiriva inhaler, symbicort inhaler <bring inhalers with you>, albuterol nebulizer, azelastine (Astelin) nasal spray, buspirone (buspar), diltiazem (cardizem), bentyl (dicyclomine), fluoxetine (prozac), hydroxyzine (atarax)-if needed, ipratropium (atrovent) nasal spray, Zaditor eye drops, lyrica, olopatadine eye drops, oxycarbazepine (trileptal), oxybutynin (ditropan-XL), pain pill (percocet)-if needed, tizanidine (zanaflex)-if needed for muscle spasms  7 days prior to surgery STOP taking any Aspirin, Aleve, Naproxen, Ibuprofen, Motrin, Advil, Goody's, BC's, all herbal medications, fish oil, and all vitamins   Do not wear jewelry, make-up or nail polish.  Do not wear lotions, powders, or perfumes, or deoderant.  Do not shave 48 hours prior to surgery.  Men may shave face and neck.  Do not bring valuables to the hospital.  [facility] is not responsible for any belongings or valuables.  Contacts, dentures or bridgework may not be worn into surgery.  Leave your suitcase in the car.  After surgery it may be brought to your room.  For patients admitted to the hospital, discharge time will be determined by your treatment team.  Patients discharged the day of surgery will not be allowed to drive home.    Special  instructions:      How to Manage Your Diabetes Before and After Surgery  Why is it important to control my blood sugar before and after surgery? . Improving blood sugar levels before and after surgery helps healing and can limit problems. . A way of improving blood sugar control is eating a healthy diet by: o  Eating less sugar and carbohydrates o  Increasing activity/exercise o  Talking with your doctor about reaching your blood sugar goals . High blood sugars (greater than 180 mg/dL) can raise your risk of infections and slow your recovery, so you will need to focus on controlling your diabetes during the weeks before surgery. . Make sure that the doctor who takes care of your diabetes knows about your planned surgery including the date and location.  How do I manage my blood sugar before surgery? . Check your blood sugar at least 4 times a day, starting 2 days before surgery, to make sure that the level is not too high or low. o Check your blood sugar the morning of your surgery when you wake up and every 2 hours until you get to the Short Stay unit. . If your blood sugar is less than 70 mg/dL, you will need to treat for low blood sugar: o Do not take insulin. o Treat a low blood sugar (less than 70 mg/dL) with  cup of clear juice (cranberry or apple), 4 glucose tablets, OR glucose gel. o Recheck blood sugar in 15 minutes after treatment (to make sure it is greater than 70 mg/dL). If your blood sugar is not greater than 70 mg/dL on recheck, call [phone] for further instructions. .Marland Kitchen  Report your blood sugar to the short stay nurse when you get to Short Stay.  . If you are admitted to the hospital after surgery: o Your blood sugar will be checked by the staff and you will probably be given insulin after surgery (instead of oral diabetes medicines) to make sure you have good blood sugar levels. o The goal for blood sugar control after surgery is 80-180 mg/dL.      WHAT DO I DO  ABOUT MY DIABETES MEDICATION?   Marland Kitchen Do not take oral diabetes medicines (pills) the morning of surgery.   Patient Signature:  Date:   Nurse Signature:  Date:   Reviewed and Endorsed by [facility] Patient Education Committee, August 2015   [facility]- Preparing For Surgery  Before surgery, you can play an important role. Because skin is not sterile, your skin needs to be as free of germs as possible. You can reduce the number of germs on your skin by washing with CHG (chlorahexidine gluconate) Soap before surgery.  CHG is an antiseptic cleaner which kills germs and bonds with the skin to continue killing germs even after washing.  Please do not use if you have an allergy to CHG or antibacterial soaps. If your skin becomes reddened/irritated stop using the CHG.  Do not shave (including legs and underarms) for at least 48 hours prior to first CHG shower. It is OK to shave your face.  Please follow these instructions carefully.   1. Shower the NIGHT BEFORE SURGERY and the MORNING OF SURGERY with CHG.   2. If you chose to wash your hair, wash your hair first as usual with your normal shampoo.  3. After you shampoo, rinse your hair and body thoroughly to remove the shampoo.  4. Use CHG as you would any other liquid soap. You can apply CHG directly to the skin and wash gently with a scrungie or a clean washcloth.   5. Apply the CHG Soap to your body ONLY FROM THE NECK DOWN.  Do not use on open wounds or open sores. Avoid contact with your eyes, ears, mouth and genitals (private parts). Wash genitals (private parts) with your normal soap.  6. Wash thoroughly, paying special attention to the area where your surgery will be performed.  7. Thoroughly rinse your body with warm water from the neck down.  8. DO NOT shower/wash with your normal soap after using and rinsing off the CHG Soap.  9. Pat yourself dry with a CLEAN TOWEL.   10. Wear CLEAN PAJAMAS   11. Place CLEAN SHEETS on your  bed the night of your first shower and DO NOT SLEEP WITH PETS.    Day of Surgery: Do not apply any deodorants/lotions. Please wear clean clothes to the hospital/surgery center.    Please read over the following fact sheets that you were given. Pain Booklet, Coughing and Deep Breathing, MRSA Information and Surgical Site Infection Prevention

## 2017-05-11 ENCOUNTER — Encounter (HOSPITAL_COMMUNITY): Payer: Self-pay

## 2017-05-11 ENCOUNTER — Encounter (HOSPITAL_COMMUNITY)
Admission: RE | Admit: 2017-05-11 | Discharge: 2017-05-11 | Disposition: A | Discharge status: Home / Self Care | Payer: Medicare HMO | Source: Ambulatory Visit | Attending: Orthopedic Surgery | Admitting: Orthopedic Surgery

## 2017-05-11 ENCOUNTER — Ambulatory Visit (HOSPITAL_COMMUNITY)
Admission: RE | Admit: 2017-05-11 | Discharge: 2017-05-11 | Disposition: A | Discharge status: Home / Self Care | Payer: Medicare HMO | Source: Ambulatory Visit | Attending: Orthopedic Surgery | Admitting: Orthopedic Surgery

## 2017-05-11 DIAGNOSIS — Z981 Arthrodesis status: Secondary | ICD-10-CM | POA: Diagnosis not present

## 2017-05-11 DIAGNOSIS — Z7951 Long term (current) use of inhaled steroids: Secondary | ICD-10-CM | POA: Insufficient documentation

## 2017-05-11 DIAGNOSIS — R05 Cough: Secondary | ICD-10-CM | POA: Diagnosis not present

## 2017-05-11 DIAGNOSIS — G4733 Obstructive sleep apnea (adult) (pediatric): Secondary | ICD-10-CM | POA: Insufficient documentation

## 2017-05-11 DIAGNOSIS — M1711 Unilateral primary osteoarthritis, right knee: Secondary | ICD-10-CM | POA: Insufficient documentation

## 2017-05-11 DIAGNOSIS — Z79899 Other long term (current) drug therapy: Secondary | ICD-10-CM | POA: Diagnosis not present

## 2017-05-11 DIAGNOSIS — Z0183 Encounter for blood typing: Secondary | ICD-10-CM | POA: Insufficient documentation

## 2017-05-11 DIAGNOSIS — J45909 Unspecified asthma, uncomplicated: Secondary | ICD-10-CM | POA: Insufficient documentation

## 2017-05-11 DIAGNOSIS — Z7982 Long term (current) use of aspirin: Secondary | ICD-10-CM | POA: Insufficient documentation

## 2017-05-11 DIAGNOSIS — I10 Essential (primary) hypertension: Secondary | ICD-10-CM | POA: Diagnosis not present

## 2017-05-11 DIAGNOSIS — Z7984 Long term (current) use of oral hypoglycemic drugs: Secondary | ICD-10-CM | POA: Diagnosis not present

## 2017-05-11 DIAGNOSIS — E119 Type 2 diabetes mellitus without complications: Secondary | ICD-10-CM | POA: Insufficient documentation

## 2017-05-11 DIAGNOSIS — Z01812 Encounter for preprocedural laboratory examination: Secondary | ICD-10-CM | POA: Insufficient documentation

## 2017-05-11 DIAGNOSIS — Z01818 Encounter for other preprocedural examination: Secondary | ICD-10-CM | POA: Diagnosis not present

## 2017-05-11 HISTORY — DX: Sleep apnea, unspecified: G47.30

## 2017-05-11 HISTORY — DX: Major depressive disorder, single episode, unspecified: F32.9

## 2017-05-11 HISTORY — DX: Personal history of urinary calculi: Z87.442

## 2017-05-11 HISTORY — DX: Depression, unspecified: F32.A

## 2017-05-11 LAB — TYPE AND SCREEN
ABO/RH(D): A POS
Antibody Screen: NEGATIVE

## 2017-05-11 LAB — BASIC METABOLIC PANEL
ANION GAP: 9 (ref 5–15)
BUN: 12 mg/dL (ref 6–20)
CALCIUM: 9.5 mg/dL (ref 8.9–10.3)
CO2: 28 mmol/L (ref 22–32)
Chloride: 103 mmol/L (ref 101–111)
Creatinine, Ser: 0.64 mg/dL (ref 0.44–1.00)
Glucose, Bld: 194 mg/dL — ABNORMAL HIGH (ref 65–99)
POTASSIUM: 3.1 mmol/L — AB (ref 3.5–5.1)
Sodium: 140 mmol/L (ref 135–145)

## 2017-05-11 LAB — URINALYSIS, ROUTINE W REFLEX MICROSCOPIC
Bilirubin Urine: NEGATIVE
GLUCOSE, UA: NEGATIVE mg/dL
HGB URINE DIPSTICK: NEGATIVE
Ketones, ur: NEGATIVE mg/dL
LEUKOCYTES UA: NEGATIVE
Nitrite: NEGATIVE
PH: 6 (ref 5.0–8.0)
Protein, ur: NEGATIVE mg/dL
Specific Gravity, Urine: 1.017 (ref 1.005–1.030)

## 2017-05-11 LAB — CBC WITH DIFFERENTIAL/PLATELET
BASOS ABS: 0.1 10*3/uL (ref 0.0–0.1)
Basophils Relative: 1 %
Eosinophils Absolute: 0.1 10*3/uL (ref 0.0–0.7)
Eosinophils Relative: 2 %
HEMATOCRIT: 40.8 % (ref 36.0–46.0)
Hemoglobin: 12.6 g/dL (ref 12.0–15.0)
LYMPHS PCT: 44 %
Lymphs Abs: 3.1 10*3/uL (ref 0.7–4.0)
MCH: 26 pg (ref 26.0–34.0)
MCHC: 30.9 g/dL (ref 30.0–36.0)
MCV: 84.3 fL (ref 78.0–100.0)
MONOS PCT: 11 %
Monocytes Absolute: 0.8 10*3/uL (ref 0.1–1.0)
NEUTROS ABS: 3 10*3/uL (ref 1.7–7.7)
NEUTROS PCT: 42 %
Platelets: 355 10*3/uL (ref 150–400)
RBC: 4.84 MIL/uL (ref 3.87–5.11)
RDW: 15.1 % (ref 11.5–15.5)
WBC: 7.1 10*3/uL (ref 4.0–10.5)

## 2017-05-11 LAB — PROTIME-INR
INR: 0.98
PROTHROMBIN TIME: 13 s (ref 11.4–15.2)

## 2017-05-11 LAB — SURGICAL PCR SCREEN
MRSA, PCR: NEGATIVE
Staphylococcus aureus: POSITIVE — AB

## 2017-05-11 LAB — APTT: APTT: 24 s (ref 24–36)

## 2017-05-11 NOTE — Progress Notes (Addendum)
[place] Did see a Dr. Merian CapronMarc Rosenberg @ [facility] in [phone]   LOV 08/2016 She did state that back in [place], she had pneumonia, and subsequent decrease in HR, and was set for workup there.  (Have requested records) for stress test & ekg. In 2010 she had Echo and Myocardial perfusion scan @ [facility] (care everywhere) Currently denies any cardiac issues now. Also was tested for OSA (2017)  [place] in [place] [facility] [place] (requested those records)  States she "couldn't bring the equipment past state lines", so currently she doesn't have machine or mask. [facility] [address]  Sees the PA, Mony  LOV 02/2017 She states last A1c was back in Dec.  (drawn today) and that her am sugars run 89-170.

## 2017-05-12 LAB — HEMOGLOBIN A1C
Hgb A1c MFr Bld: 7.9 % — ABNORMAL HIGH (ref 4.8–5.6)
Mean Plasma Glucose: 180 mg/dL

## 2017-05-12 NOTE — Progress Notes (Addendum)
Anesthesia Chart Review:  Pt is a 66 year old female scheduled for R total knee arthroplasty on 05/22/2017 with Gean BirchwoodFrank Rowan, MD  - PCP is Manon HildingJessica Mauney, PA. Pt cleared for surgery at increased risk at last office visit 05/16/17 by Iva BoopKevin Via, MD.   PMH includes:  SVT (07/2016), HTN, DM, OSA, asthma. Never smoker. BMI 31. S/p lumbar fusion 10/08/12  Medications include: Albuterol, ASA 81 mg, Symbicort, diltiazem, HCTZ, Janumet, metformin, pravastatin, ramipril, simvastatin, Spiriva  BP (!) 157/96   Pulse 81   Temp 36.7 C   Resp 20   Ht 5\' 4"  (1.626 m)   Wt 179 lb 14.4 oz (81.6 kg)   SpO2 98%   BMI 30.88 kg/m    Preoperative labs reviewed.  HbA1c 7.9, glucose 194  CXR 05/11/17: Scattered areas scarring bilaterally. No edema or consolidation. Aortic arch region prominence is likely due to chronic hypertensive change.  EKG 05/11/17: NSR. Moderate voltage criteria for LVH, may be normal variant  Echo 07/05/16  ([facility], [place], [place] 1. LV systolic function normal with visual assessment. EF estimated to be 60%. No obvious wall motion abnormalities identified in the views obtained. 2. RV systolic pressure mildly increased. Estimated peak pressure was 45 mmHg.  Nuclear stress test 03/23/16 ([facility], in [place], [place] 1. Negative pharmacologic stress test from an electrocardiographic standpoint 2. Most likely normal perfusion study. Next line 3. Perfusion imaging shows no evidence of significant ongoing ischemia or prior infarction. There is apical wall thinning noted. This is likely consistent with tissue attenuation. 4. Gated SPECT imaging shows normal LV chamber size and function with an estimated EF 67%. No obvious regional wall motion abnormalities noted. Of note, there is no significant apical wall motion abnormality. 5. In comparison to prior study of 12/2008, no significant changes are noted. 6. This is low risk finding.  If no  changes, I anticipate pt can proceed with surgery as scheduled.   Rica Mastngela Wendell Fiebig, FNP-BC [facility]iology Phone: [phone] 05/19/2017 11:53 AM

## 2017-05-15 NOTE — H&P (Signed)
TOTAL KNEE ADMISSION H&P  Patient is being admitted for right total knee arthroplasty.  Subjective:  Chief Complaint:right knee pain.  HPI: Chelsea Benson, 66 y.o. female, has a history of pain and functional disability in the right knee due to arthritis and has failed non-surgical conservative treatments for greater than 12 weeks to includeNSAID's and/or analgesics, corticosteriod injections, viscosupplementation injections and activity modification.  Onset of symptoms was gradual, starting 2 years ago with gradually worsening course since that time. The patient noted no past surgery on the right knee(s).  Patient currently rates pain in the right knee(s) at 10 out of 10 with activity. Patient has night pain, worsening of pain with activity and weight bearing and pain that interferes with activities of daily living.  Patient has evidence of joint space narrowing by imaging studies.  No active infection.  There are no active problems to display for this patient.  Past Medical History:  Diagnosis Date  . Allergic rhinoconjunctivitis   . Anxiety   . Arthritis    DDD, spondylosis  . Asthma   . Chronic kidney disease    renal calculi  . Depression   . Diabetes mellitus without complication (HCC)    dx in 2007  . Headache(784.0)    sinus related   . History of kidney stones   . Hypertension   . Mental disorder   . Sleep apnea     Past Surgical History:  Procedure Laterality Date  . ABDOMINAL HYSTERECTOMY    . BACK SURGERY     2012- lumbar fusion  . BREAST SURGERY     L cyst removed - 1972  . BUNIONECTOMY     L foot  . calf -R- cyst removed    . CARPAL TUNNEL RELEASE     both hands   . great toe     removed arthritis   . NASAL SINUS SURGERY    . SHOULDER ARTHROSCOPY     R shoulder- RCR  . TRIGGER FINGER RELEASE     L thumb  . TUBAL LIGATION      No prescriptions prior to admission.   Allergies  Allergen Reactions  . Crestor [Rosuvastatin Calcium] Other (See  Comments)    Makes her heart beat really fast.  . Almond Meal     Tongue itches  . Almond Oil     Itching of tongue  . Cozaar Cough  . Other     Cough    Social History  Substance Use Topics  . Smoking status: Never Smoker  . Smokeless tobacco: Never Used  . Alcohol use Yes     Comment: rare use    Family History  Problem Relation Age of Onset  . Allergies Neg Hx   . Asthma Neg Hx   . Eczema Neg Hx   . Immunodeficiency Neg Hx      Review of Systems  Constitutional: Negative.   Eyes: Negative.   Respiratory: Positive for shortness of breath.        Hoarseness  Cardiovascular: Positive for leg swelling.       Hx of blood clots, HTN  Gastrointestinal: Negative.   Genitourinary:       Kidney stones  Musculoskeletal: Positive for joint pain and myalgias.  Skin: Negative.   Neurological: Negative.   Endo/Heme/Allergies: Negative.   Psychiatric/Behavioral: Positive for depression. The patient is nervous/anxious.     Objective:  Physical Exam  Constitutional: She is oriented to person, place, and time. She appears well-developed  and well-nourished.  HENT:  Head: Normocephalic and atraumatic.  Eyes: Pupils are equal, round, and reactive to light.  Neck: Normal range of motion. Neck supple.  Cardiovascular: Intact distal pulses.   Respiratory: Effort normal.  Musculoskeletal:  Mild effusion noticed some tenderness to palpation or lateral joint line.  Some tenderness to palpation around the patella.  Able to achieve full flexion and extension.  Normal strength to resistance with flexion and extension.  Some weakness with resistance to hip flexion.  Normal plantar and dorsal flexion.  Normal endpoints with valgus and varus stress testing.    Neurological: She is alert and oriented to person, place, and time.  Skin: Skin is warm and dry.  Psychiatric: She has a normal mood and affect. Her behavior is normal. Judgment and thought content normal.    Vital signs in last 24  hours:    Labs:   Estimated body mass index is 30.88 kg/m as calculated from the following:   Height as of 05/11/17: 5\' 4"  (1.626 m).   Weight as of 05/11/17: 81.6 kg (179 lb 14.4 oz).   Imaging Review Plain radiographs demonstrate  AP and Zoila ShutterRosenberg of the right knee .  These are showing severe osteoarthritic changes in the lateral joint line of the right knee.  Assessment/Plan:  End stage arthritis, right knee   The patient history, physical examination, clinical judgment of the provider and imaging studies are consistent with end stage degenerative joint disease of the right knee(s) and total knee arthroplasty is deemed medically necessary. The treatment options including medical management, injection therapy arthroscopy and arthroplasty were discussed at length. The risks and benefits of total knee arthroplasty were presented and reviewed. The risks due to aseptic loosening, infection, stiffness, patella tracking problems, thromboembolic complications and other imponderables were discussed. The patient acknowledged the explanation, agreed to proceed with the plan and consent was signed. Patient is being admitted for inpatient treatment for surgery, pain control, PT, OT, prophylactic antibiotics, VTE prophylaxis, progressive ambulation and ADL's and discharge planning. The patient is planning to be discharged home with home health services

## 2017-05-16 DIAGNOSIS — M549 Dorsalgia, unspecified: Secondary | ICD-10-CM | POA: Diagnosis not present

## 2017-05-16 DIAGNOSIS — Z01818 Encounter for other preprocedural examination: Secondary | ICD-10-CM | POA: Diagnosis not present

## 2017-05-16 DIAGNOSIS — B354 Tinea corporis: Secondary | ICD-10-CM | POA: Diagnosis not present

## 2017-05-16 DIAGNOSIS — G4733 Obstructive sleep apnea (adult) (pediatric): Secondary | ICD-10-CM | POA: Diagnosis not present

## 2017-05-16 DIAGNOSIS — E1149 Type 2 diabetes mellitus with other diabetic neurological complication: Secondary | ICD-10-CM | POA: Diagnosis not present

## 2017-05-16 DIAGNOSIS — I10 Essential (primary) hypertension: Secondary | ICD-10-CM | POA: Diagnosis not present

## 2017-05-16 DIAGNOSIS — D509 Iron deficiency anemia, unspecified: Secondary | ICD-10-CM | POA: Diagnosis not present

## 2017-05-16 DIAGNOSIS — J4540 Moderate persistent asthma, uncomplicated: Secondary | ICD-10-CM | POA: Diagnosis not present

## 2017-05-16 DIAGNOSIS — M25561 Pain in right knee: Secondary | ICD-10-CM | POA: Diagnosis not present

## 2017-05-17 DIAGNOSIS — H524 Presbyopia: Secondary | ICD-10-CM | POA: Diagnosis not present

## 2017-05-17 DIAGNOSIS — E1149 Type 2 diabetes mellitus with other diabetic neurological complication: Secondary | ICD-10-CM | POA: Diagnosis not present

## 2017-05-18 DIAGNOSIS — M1711 Unilateral primary osteoarthritis, right knee: Secondary | ICD-10-CM | POA: Diagnosis present

## 2017-05-19 MED ORDER — CEFAZOLIN SODIUM-DEXTROSE 2-4 GM/100ML-% IV SOLN
2.0000 g | INTRAVENOUS | Status: AC
  Administered 2017-05-22: 2 g via INTRAVENOUS
  Filled 2017-05-19: qty 100

## 2017-05-19 MED ORDER — DEXTROSE-NACL 5-0.45 % IV SOLN: INTRAVENOUS | Status: DC

## 2017-05-19 MED ORDER — BUPIVACAINE LIPOSOME 1.3 % IJ SUSP
20.0000 mL | Freq: Once | INTRAMUSCULAR | Status: AC
  Administered 2017-05-22: 20 mL
  Filled 2017-05-19: qty 20

## 2017-05-19 MED ORDER — TRANEXAMIC ACID 1000 MG/10ML IV SOLN
1000.0000 mg | INTRAVENOUS | Status: AC
  Administered 2017-05-22: 1000 mg via INTRAVENOUS
  Filled 2017-05-19: qty 10

## 2017-05-19 MED ORDER — TRANEXAMIC ACID 1000 MG/10ML IV SOLN
2000.0000 mg | INTRAVENOUS | Status: AC
  Administered 2017-05-22: 2000 mg via TOPICAL
  Filled 2017-05-19: qty 20

## 2017-05-21 NOTE — Anesthesia Preprocedure Evaluation (Addendum)
Anesthesia Evaluation    Reviewed: Allergy & Precautions, H&P , Patient's Chart, lab work & pertinent test results  Airway Mallampati: III  TM Distance: >3 FB Neck ROM: Full    Dental  (+) Teeth Intact, Dental Advisory Given   Pulmonary neg pulmonary ROS, asthma , sleep apnea ,    breath sounds clear to auscultation       Cardiovascular Exercise Tolerance: Good hypertension, Pt. on medications negative cardio ROS   Rhythm:Regular Rate:Normal     Neuro/Psych  Headaches, Anxiety Depression negative neurological ROS  negative psych ROS   GI/Hepatic negative GI ROS, Neg liver ROS,   Endo/Other  negative endocrine ROSdiabetes, Type 2, Oral Hypoglycemic Agents  Renal/GU Renal diseasenegative Renal ROS  negative genitourinary   Musculoskeletal  (+) Arthritis , Osteoarthritis,    Abdominal   Peds  Hematology negative hematology ROS (+)   Anesthesia Other Findings   Reproductive/Obstetrics negative OB ROS                            Anesthesia Physical Anesthesia Plan  ASA: III  Anesthesia Plan: General   Post-op Pain Management:  Regional for Post-op pain   Induction: Intravenous  PONV Risk Score and Plan: 4 or greater and Ondansetron, Dexamethasone, Propofol and Midazolam  Airway Management Planned: LMA  Additional Equipment:   Intra-op Plan:   Post-operative Plan: Extubation in OR  Informed Consent: I have reviewed the patients History and Physical, chart, labs and discussed the procedure including the risks, benefits and alternatives for the proposed anesthesia with the patient or authorized representative who has indicated his/her understanding and acceptance.   Dental advisory given  Plan Discussed with: CRNA  Anesthesia Plan Comments:        Anesthesia Quick Evaluation

## 2017-05-22 ENCOUNTER — Inpatient Hospital Stay (HOSPITAL_COMMUNITY): Payer: Medicare HMO | Admitting: Emergency Medicine

## 2017-05-22 ENCOUNTER — Inpatient Hospital Stay (HOSPITAL_COMMUNITY)
Admission: RE | Admit: 2017-05-22 | Discharge: 2017-05-24 | DRG: 470 | Disposition: A | Discharge status: Skilled Nursing Facility | Payer: Medicare HMO | Source: Ambulatory Visit | Attending: Orthopedic Surgery | Admitting: Orthopedic Surgery

## 2017-05-22 ENCOUNTER — Encounter (HOSPITAL_COMMUNITY)
Admission: RE | Disposition: A | Discharge status: Skilled Nursing Facility | Payer: Self-pay | Source: Ambulatory Visit | Attending: Orthopedic Surgery

## 2017-05-22 ENCOUNTER — Inpatient Hospital Stay (HOSPITAL_COMMUNITY): Payer: Medicare HMO | Admitting: Certified Registered Nurse Anesthetist

## 2017-05-22 ENCOUNTER — Encounter (HOSPITAL_COMMUNITY): Payer: Self-pay

## 2017-05-22 DIAGNOSIS — Z471 Aftercare following joint replacement surgery: Secondary | ICD-10-CM | POA: Diagnosis not present

## 2017-05-22 DIAGNOSIS — R262 Difficulty in walking, not elsewhere classified: Secondary | ICD-10-CM | POA: Diagnosis not present

## 2017-05-22 DIAGNOSIS — I129 Hypertensive chronic kidney disease with stage 1 through stage 4 chronic kidney disease, or unspecified chronic kidney disease: Secondary | ICD-10-CM | POA: Diagnosis not present

## 2017-05-22 DIAGNOSIS — M1711 Unilateral primary osteoarthritis, right knee: Secondary | ICD-10-CM | POA: Diagnosis present

## 2017-05-22 DIAGNOSIS — Z87442 Personal history of urinary calculi: Secondary | ICD-10-CM

## 2017-05-22 DIAGNOSIS — M6281 Muscle weakness (generalized): Secondary | ICD-10-CM | POA: Diagnosis not present

## 2017-05-22 DIAGNOSIS — N189 Chronic kidney disease, unspecified: Secondary | ICD-10-CM | POA: Diagnosis not present

## 2017-05-22 DIAGNOSIS — G894 Chronic pain syndrome: Secondary | ICD-10-CM | POA: Diagnosis not present

## 2017-05-22 DIAGNOSIS — E1122 Type 2 diabetes mellitus with diabetic chronic kidney disease: Secondary | ICD-10-CM | POA: Diagnosis present

## 2017-05-22 DIAGNOSIS — Z96651 Presence of right artificial knee joint: Secondary | ICD-10-CM | POA: Diagnosis not present

## 2017-05-22 DIAGNOSIS — R531 Weakness: Secondary | ICD-10-CM | POA: Diagnosis not present

## 2017-05-22 DIAGNOSIS — Z79899 Other long term (current) drug therapy: Secondary | ICD-10-CM

## 2017-05-22 DIAGNOSIS — G8918 Other acute postprocedural pain: Secondary | ICD-10-CM | POA: Diagnosis not present

## 2017-05-22 DIAGNOSIS — E119 Type 2 diabetes mellitus without complications: Secondary | ICD-10-CM | POA: Diagnosis not present

## 2017-05-22 HISTORY — PX: TOTAL KNEE ARTHROPLASTY: SHX125

## 2017-05-22 LAB — GLUCOSE, CAPILLARY
GLUCOSE-CAPILLARY: 180 mg/dL — AB (ref 65–99)
GLUCOSE-CAPILLARY: 188 mg/dL — AB (ref 65–99)
GLUCOSE-CAPILLARY: 225 mg/dL — AB (ref 65–99)
Glucose-Capillary: 249 mg/dL — ABNORMAL HIGH (ref 65–99)
Glucose-Capillary: 372 mg/dL — ABNORMAL HIGH (ref 65–99)

## 2017-05-22 SURGERY — ARTHROPLASTY, KNEE, TOTAL
Anesthesia: General | Site: Knee | Laterality: Right

## 2017-05-22 MED ORDER — HYDROCHLOROTHIAZIDE 25 MG PO TABS
25.0000 mg | ORAL_TABLET | Freq: Every day | ORAL | Status: DC
  Administered 2017-05-23 – 2017-05-24 (×2): 25 mg via ORAL
  Filled 2017-05-22 (×2): qty 1

## 2017-05-22 MED ORDER — SENNOSIDES-DOCUSATE SODIUM 8.6-50 MG PO TABS: 1.0000 | ORAL_TABLET | Freq: Every evening | ORAL | Status: DC | PRN

## 2017-05-22 MED ORDER — ACETAMINOPHEN 500 MG PO TABS
ORAL_TABLET | ORAL | Status: AC
  Filled 2017-05-22: qty 2

## 2017-05-22 MED ORDER — FENTANYL CITRATE (PF) 100 MCG/2ML IJ SOLN: INTRAMUSCULAR | Status: DC | PRN

## 2017-05-22 MED ORDER — PREGABALIN 100 MG PO CAPS
100.0000 mg | ORAL_CAPSULE | Freq: Three times a day (TID) | ORAL | Status: DC
  Administered 2017-05-22 – 2017-05-24 (×7): 100 mg via ORAL
  Filled 2017-05-22 (×7): qty 1

## 2017-05-22 MED ORDER — PROPOFOL 1000 MG/100ML IV EMUL
INTRAVENOUS | Status: AC
  Filled 2017-05-22: qty 100

## 2017-05-22 MED ORDER — CELECOXIB 200 MG PO CAPS
ORAL_CAPSULE | ORAL | Status: AC
  Filled 2017-05-22: qty 1

## 2017-05-22 MED ORDER — BUSPIRONE HCL 5 MG PO TABS
15.0000 mg | ORAL_TABLET | Freq: Three times a day (TID) | ORAL | Status: DC
  Administered 2017-05-22 – 2017-05-24 (×7): 15 mg via ORAL
  Filled 2017-05-22 (×7): qty 1

## 2017-05-22 MED ORDER — SODIUM CHLORIDE 0.9 % IR SOLN
Status: DC | PRN
  Administered 2017-05-22: 3000 mL
  Administered 2017-05-22: 1000 mL

## 2017-05-22 MED ORDER — PHENYLEPHRINE HCL 10 MG/ML IJ SOLN
INTRAMUSCULAR | Status: DC | PRN
  Administered 2017-05-22: 80 ug via INTRAVENOUS

## 2017-05-22 MED ORDER — FENTANYL CITRATE (PF) 100 MCG/2ML IJ SOLN
50.0000 ug | Freq: Once | INTRAMUSCULAR | Status: AC
  Administered 2017-05-22: 50 ug via INTRAVENOUS

## 2017-05-22 MED ORDER — DILTIAZEM HCL ER COATED BEADS 120 MG PO CP24
120.0000 mg | ORAL_CAPSULE | Freq: Every day | ORAL | Status: DC
  Administered 2017-05-23 – 2017-05-24 (×2): 120 mg via ORAL
  Filled 2017-05-22 (×2): qty 1

## 2017-05-22 MED ORDER — OXCARBAZEPINE 150 MG PO TABS
150.0000 mg | ORAL_TABLET | Freq: Three times a day (TID) | ORAL | Status: DC
  Administered 2017-05-22 – 2017-05-24 (×7): 150 mg via ORAL
  Filled 2017-05-22 (×8): qty 1

## 2017-05-22 MED ORDER — PROPOFOL 10 MG/ML IV BOLUS
INTRAVENOUS | Status: DC | PRN
  Administered 2017-05-22: 150 mg via INTRAVENOUS

## 2017-05-22 MED ORDER — OXYCODONE HCL 5 MG PO TABS
5.0000 mg | ORAL_TABLET | ORAL | Status: DC | PRN
  Administered 2017-05-22 – 2017-05-24 (×13): 10 mg via ORAL
  Filled 2017-05-22 (×13): qty 2

## 2017-05-22 MED ORDER — FENTANYL CITRATE (PF) 250 MCG/5ML IJ SOLN
INTRAMUSCULAR | Status: AC
  Filled 2017-05-22: qty 5

## 2017-05-22 MED ORDER — FENTANYL CITRATE (PF) 100 MCG/2ML IJ SOLN
INTRAMUSCULAR | Status: AC
Start: 2017-05-22 — End: 2017-05-22
  Filled 2017-05-22: qty 2

## 2017-05-22 MED ORDER — HYDRALAZINE HCL 20 MG/ML IJ SOLN
10.0000 mg | INTRAMUSCULAR | Status: DC | PRN
  Administered 2017-05-22 (×2): 10 mg via INTRAVENOUS

## 2017-05-22 MED ORDER — SODIUM CHLORIDE 0.9 % IJ SOLN
INTRAMUSCULAR | Status: DC | PRN
  Administered 2017-05-22: 50 mL

## 2017-05-22 MED ORDER — CEFAZOLIN SODIUM-DEXTROSE 2-4 GM/100ML-% IV SOLN
2.0000 g | Freq: Three times a day (TID) | INTRAVENOUS | Status: DC
  Administered 2017-05-22: 2 g via INTRAVENOUS
  Filled 2017-05-22 (×2): qty 100

## 2017-05-22 MED ORDER — PHENYLEPHRINE HCL 10 MG/ML IJ SOLN
INTRAVENOUS | Status: DC | PRN
  Administered 2017-05-22: 25 ug/min via INTRAVENOUS

## 2017-05-22 MED ORDER — BUPIVACAINE-EPINEPHRINE (PF) 0.25% -1:200000 IJ SOLN
INTRAMUSCULAR | Status: AC
  Filled 2017-05-22: qty 60

## 2017-05-22 MED ORDER — ONDANSETRON HCL 4 MG PO TABS: 4.0000 mg | ORAL_TABLET | Freq: Four times a day (QID) | ORAL | Status: DC | PRN

## 2017-05-22 MED ORDER — BUPIVACAINE-EPINEPHRINE (PF) 0.25% -1:200000 IJ SOLN
INTRAMUSCULAR | Status: DC | PRN
  Administered 2017-05-22: 50 mL

## 2017-05-22 MED ORDER — METFORMIN HCL 500 MG PO TABS
500.0000 mg | ORAL_TABLET | Freq: Two times a day (BID) | ORAL | Status: DC
  Administered 2017-05-22 – 2017-05-24 (×4): 500 mg via ORAL
  Filled 2017-05-22 (×4): qty 1

## 2017-05-22 MED ORDER — MIDAZOLAM HCL 2 MG/2ML IJ SOLN
INTRAMUSCULAR | Status: AC
  Filled 2017-05-22: qty 2

## 2017-05-22 MED ORDER — GABAPENTIN 300 MG PO CAPS
ORAL_CAPSULE | ORAL | Status: AC
  Filled 2017-05-22: qty 2

## 2017-05-22 MED ORDER — MOMETASONE FURO-FORMOTEROL FUM 200-5 MCG/ACT IN AERO
2.0000 | INHALATION_SPRAY | Freq: Two times a day (BID) | RESPIRATORY_TRACT | Status: DC
  Administered 2017-05-22 – 2017-05-24 (×4): 2 via RESPIRATORY_TRACT
  Filled 2017-05-22: qty 8.8

## 2017-05-22 MED ORDER — DEXAMETHASONE SODIUM PHOSPHATE 4 MG/ML IJ SOLN
INTRAMUSCULAR | Status: DC | PRN
  Administered 2017-05-22: 4 mg via INTRAVENOUS

## 2017-05-22 MED ORDER — DOCUSATE SODIUM 100 MG PO CAPS
100.0000 mg | ORAL_CAPSULE | Freq: Two times a day (BID) | ORAL | Status: DC
  Administered 2017-05-22 – 2017-05-24 (×5): 100 mg via ORAL
  Filled 2017-05-22 (×5): qty 1

## 2017-05-22 MED ORDER — TRANEXAMIC ACID 1000 MG/10ML IV SOLN
1000.0000 mg | Freq: Once | INTRAVENOUS | Status: AC
  Administered 2017-05-22: 1000 mg via INTRAVENOUS
  Filled 2017-05-22: qty 10

## 2017-05-22 MED ORDER — METHOCARBAMOL 1000 MG/10ML IJ SOLN
500.0000 mg | Freq: Four times a day (QID) | INTRAVENOUS | Status: DC | PRN
  Filled 2017-05-22: qty 5

## 2017-05-22 MED ORDER — ACETAMINOPHEN 325 MG PO TABS
650.0000 mg | ORAL_TABLET | Freq: Four times a day (QID) | ORAL | Status: DC | PRN
  Administered 2017-05-22: 650 mg via ORAL
  Filled 2017-05-22: qty 2

## 2017-05-22 MED ORDER — LIDOCAINE 2% (20 MG/ML) 5 ML SYRINGE
INTRAMUSCULAR | Status: DC | PRN
  Administered 2017-05-22: 60 mg via INTRAVENOUS

## 2017-05-22 MED ORDER — ZOLPIDEM TARTRATE 5 MG PO TABS
5.0000 mg | ORAL_TABLET | Freq: Every day | ORAL | Status: DC
  Administered 2017-05-23: 5 mg via ORAL
  Filled 2017-05-22: qty 1

## 2017-05-22 MED ORDER — TIOTROPIUM BROMIDE MONOHYDRATE 18 MCG IN CAPS
18.0000 ug | ORAL_CAPSULE | Freq: Every day | RESPIRATORY_TRACT | Status: DC
  Administered 2017-05-23: 18 ug via RESPIRATORY_TRACT
  Filled 2017-05-22 (×2): qty 5

## 2017-05-22 MED ORDER — ACETAMINOPHEN 650 MG RE SUPP: 650.0000 mg | Freq: Four times a day (QID) | RECTAL | Status: DC | PRN

## 2017-05-22 MED ORDER — MENTHOL 3 MG MT LOZG: 1.0000 | LOZENGE | OROMUCOSAL | Status: DC | PRN

## 2017-05-22 MED ORDER — IPRATROPIUM BROMIDE 0.06 % NA SOLN: 2.0000 | Freq: Four times a day (QID) | NASAL | Status: DC

## 2017-05-22 MED ORDER — LINAGLIPTIN 5 MG PO TABS
5.0000 mg | ORAL_TABLET | Freq: Every day | ORAL | Status: DC
  Administered 2017-05-23 – 2017-05-24 (×2): 5 mg via ORAL
  Filled 2017-05-22 (×2): qty 1

## 2017-05-22 MED ORDER — METOCLOPRAMIDE HCL 5 MG PO TABS: 5.0000 mg | ORAL_TABLET | Freq: Three times a day (TID) | ORAL | Status: DC | PRN

## 2017-05-22 MED ORDER — HYDROMORPHONE HCL 1 MG/ML IJ SOLN
INTRAMUSCULAR | Status: AC
  Administered 2017-05-22: 0.25 mg via INTRAVENOUS
  Filled 2017-05-22: qty 0.5

## 2017-05-22 MED ORDER — KCL IN DEXTROSE-NACL 20-5-0.45 MEQ/L-%-% IV SOLN
INTRAVENOUS | Status: DC
  Administered 2017-05-22: 16:00:00 via INTRAVENOUS
  Filled 2017-05-22 (×2): qty 1000

## 2017-05-22 MED ORDER — ALUM & MAG HYDROXIDE-SIMETH 200-200-20 MG/5ML PO SUSP: 30.0000 mL | ORAL | Status: DC | PRN

## 2017-05-22 MED ORDER — HYDRALAZINE HCL 20 MG/ML IJ SOLN
INTRAMUSCULAR | Status: AC
  Administered 2017-05-22: 10 mg via INTRAVENOUS
  Filled 2017-05-22: qty 1

## 2017-05-22 MED ORDER — CELECOXIB 200 MG PO CAPS
200.0000 mg | ORAL_CAPSULE | Freq: Once | ORAL | Status: AC
  Administered 2017-05-22: 200 mg via ORAL

## 2017-05-22 MED ORDER — ONDANSETRON HCL 4 MG/2ML IJ SOLN
INTRAMUSCULAR | Status: DC | PRN
  Administered 2017-05-22: 4 mg via INTRAVENOUS

## 2017-05-22 MED ORDER — MIDAZOLAM HCL 2 MG/2ML IJ SOLN
1.0000 mg | Freq: Once | INTRAMUSCULAR | Status: AC
Start: 2017-05-22 — End: 2017-05-22
  Administered 2017-05-22: 1 mg via INTRAVENOUS

## 2017-05-22 MED ORDER — ROPIVACAINE HCL 5 MG/ML IJ SOLN
INTRAMUSCULAR | Status: DC | PRN
  Administered 2017-05-22: 25 mL via PERINEURAL

## 2017-05-22 MED ORDER — GABAPENTIN 300 MG PO CAPS
600.0000 mg | ORAL_CAPSULE | Freq: Once | ORAL | Status: AC
  Administered 2017-05-22: 600 mg via ORAL

## 2017-05-22 MED ORDER — SITAGLIPTIN PHOS-METFORMIN HCL 50-1000 MG PO TABS: 1.0000 | ORAL_TABLET | Freq: Two times a day (BID) | ORAL | Status: DC

## 2017-05-22 MED ORDER — FLEET ENEMA 7-19 GM/118ML RE ENEM: 1.0000 | ENEMA | Freq: Once | RECTAL | Status: DC | PRN

## 2017-05-22 MED ORDER — HYDROMORPHONE HCL 1 MG/ML IJ SOLN
0.2500 mg | INTRAMUSCULAR | Status: DC | PRN
  Administered 2017-05-22 (×4): 0.25 mg via INTRAVENOUS

## 2017-05-22 MED ORDER — METHOCARBAMOL 500 MG PO TABS
500.0000 mg | ORAL_TABLET | Freq: Four times a day (QID) | ORAL | Status: DC | PRN
  Administered 2017-05-22 – 2017-05-24 (×5): 500 mg via ORAL
  Filled 2017-05-22 (×5): qty 1

## 2017-05-22 MED ORDER — POTASSIUM CHLORIDE CRYS ER 20 MEQ PO TBCR
20.0000 meq | EXTENDED_RELEASE_TABLET | Freq: Every day | ORAL | Status: DC
  Administered 2017-05-23 – 2017-05-24 (×2): 20 meq via ORAL
  Filled 2017-05-22 (×2): qty 1

## 2017-05-22 MED ORDER — MIRTAZAPINE 15 MG PO TABS
30.0000 mg | ORAL_TABLET | Freq: Every day | ORAL | Status: DC
  Administered 2017-05-22 – 2017-05-23 (×2): 30 mg via ORAL
  Filled 2017-05-22 (×2): qty 2

## 2017-05-22 MED ORDER — FENTANYL CITRATE (PF) 100 MCG/2ML IJ SOLN
INTRAMUSCULAR | Status: DC | PRN
  Administered 2017-05-22: 25 ug via INTRAVENOUS
  Administered 2017-05-22 (×3): 50 ug via INTRAVENOUS

## 2017-05-22 MED ORDER — LORATADINE 10 MG PO TABS
10.0000 mg | ORAL_TABLET | Freq: Every day | ORAL | Status: DC
  Administered 2017-05-23: 10 mg via ORAL
  Filled 2017-05-22 (×2): qty 1

## 2017-05-22 MED ORDER — METOCLOPRAMIDE HCL 5 MG/ML IJ SOLN: 5.0000 mg | Freq: Three times a day (TID) | INTRAMUSCULAR | Status: DC | PRN

## 2017-05-22 MED ORDER — ALBUTEROL SULFATE HFA 108 (90 BASE) MCG/ACT IN AERS: 2.0000 | INHALATION_SPRAY | RESPIRATORY_TRACT | Status: DC | PRN

## 2017-05-22 MED ORDER — PHENOL 1.4 % MT LIQD: 1.0000 | OROMUCOSAL | Status: DC | PRN

## 2017-05-22 MED ORDER — CEFUROXIME SODIUM 1.5 G IV SOLR
INTRAVENOUS | Status: DC | PRN
  Administered 2017-05-22: 1.5 g via INTRAVENOUS

## 2017-05-22 MED ORDER — MONTELUKAST SODIUM 10 MG PO TABS
10.0000 mg | ORAL_TABLET | Freq: Every day | ORAL | Status: DC
  Administered 2017-05-22 – 2017-05-23 (×2): 10 mg via ORAL
  Filled 2017-05-22 (×2): qty 1

## 2017-05-22 MED ORDER — PROPOFOL 10 MG/ML IV BOLUS
INTRAVENOUS | Status: AC
  Filled 2017-05-22: qty 20

## 2017-05-22 MED ORDER — DIPHENHYDRAMINE HCL 12.5 MG/5ML PO ELIX: 12.5000 mg | ORAL_SOLUTION | ORAL | Status: DC | PRN

## 2017-05-22 MED ORDER — ASPIRIN EC 325 MG PO TBEC
325.0000 mg | DELAYED_RELEASE_TABLET | Freq: Every day | ORAL | Status: DC
  Administered 2017-05-23 – 2017-05-24 (×2): 325 mg via ORAL
  Filled 2017-05-22 (×2): qty 1

## 2017-05-22 MED ORDER — OXYBUTYNIN CHLORIDE ER 10 MG PO TB24
10.0000 mg | ORAL_TABLET | Freq: Every day | ORAL | Status: DC
  Administered 2017-05-23: 10 mg via ORAL
  Filled 2017-05-22 (×2): qty 1

## 2017-05-22 MED ORDER — CHLORHEXIDINE GLUCONATE 4 % EX LIQD: 60.0000 mL | Freq: Once | CUTANEOUS | Status: DC

## 2017-05-22 MED ORDER — PRAVASTATIN SODIUM 20 MG PO TABS
20.0000 mg | ORAL_TABLET | Freq: Every evening | ORAL | Status: DC
  Administered 2017-05-22 – 2017-05-23 (×2): 20 mg via ORAL
  Filled 2017-05-22 (×2): qty 1

## 2017-05-22 MED ORDER — ACETAMINOPHEN 500 MG PO TABS
1000.0000 mg | ORAL_TABLET | Freq: Once | ORAL | Status: AC
  Administered 2017-05-22: 1000 mg via ORAL

## 2017-05-22 MED ORDER — LACTATED RINGERS IV SOLN
INTRAVENOUS | Status: DC
  Administered 2017-05-22 (×2): via INTRAVENOUS

## 2017-05-22 MED ORDER — ONDANSETRON HCL 4 MG/2ML IJ SOLN
INTRAMUSCULAR | Status: AC
  Filled 2017-05-22: qty 2

## 2017-05-22 MED ORDER — ALBUTEROL SULFATE (2.5 MG/3ML) 0.083% IN NEBU: 2.5000 mg | INHALATION_SOLUTION | RESPIRATORY_TRACT | Status: DC | PRN

## 2017-05-22 MED ORDER — HYDROMORPHONE HCL 1 MG/ML IJ SOLN
0.5000 mg | INTRAMUSCULAR | Status: DC | PRN
  Administered 2017-05-23 (×2): 0.5 mg via INTRAVENOUS
  Filled 2017-05-22 (×2): qty 1

## 2017-05-22 MED ORDER — ONDANSETRON HCL 4 MG/2ML IJ SOLN: 4.0000 mg | Freq: Four times a day (QID) | INTRAMUSCULAR | Status: DC | PRN

## 2017-05-22 MED ORDER — AZELASTINE HCL 0.1 % NA SOLN
1.0000 | Freq: Two times a day (BID) | NASAL | Status: DC
  Administered 2017-05-22 – 2017-05-23 (×3): 1 via NASAL
  Filled 2017-05-22: qty 30

## 2017-05-22 MED ORDER — INSULIN ASPART 100 UNIT/ML ~~LOC~~ SOLN
0.0000 [IU] | Freq: Three times a day (TID) | SUBCUTANEOUS | Status: DC
  Administered 2017-05-22: 15 [IU] via SUBCUTANEOUS
  Administered 2017-05-23: 5 [IU] via SUBCUTANEOUS
  Administered 2017-05-23 (×2): 8 [IU] via SUBCUTANEOUS
  Administered 2017-05-24: 11 [IU] via SUBCUTANEOUS
  Administered 2017-05-24: 8 [IU] via SUBCUTANEOUS

## 2017-05-22 MED ORDER — BISACODYL 5 MG PO TBEC: 5.0000 mg | DELAYED_RELEASE_TABLET | Freq: Every day | ORAL | Status: DC | PRN

## 2017-05-22 SURGICAL SUPPLY — 54 items
BANDAGE ESMARK 6X9 LF (GAUZE/BANDAGES/DRESSINGS) ×1 IMPLANT
BLADE SAG 18X100X1.27 (BLADE) ×3 IMPLANT
BLADE SAGITTAL 13X1.27X60 (BLADE) IMPLANT
BLADE SAGITTAL 13X1.27X60MM (BLADE)
BLADE SAW SGTL 13X75X1.27 (BLADE) ×2 IMPLANT
BNDG CMPR 9X6 STRL LF SNTH (GAUZE/BANDAGES/DRESSINGS) ×1
BNDG CMPR MED 10X6 ELC LF (GAUZE/BANDAGES/DRESSINGS) ×1
BNDG ELASTIC 6X10 VLCR STRL LF (GAUZE/BANDAGES/DRESSINGS) ×3 IMPLANT
BNDG ESMARK 6X9 LF (GAUZE/BANDAGES/DRESSINGS) ×3
BOWL SMART MIX CTS (DISPOSABLE) ×3 IMPLANT
CAPT KNEE TOTAL 3 ATTUNE ×2 IMPLANT
CEMENT HV SMART SET (Cement) ×6 IMPLANT
COVER SURGICAL LIGHT HANDLE (MISCELLANEOUS) ×3 IMPLANT
CUFF TOURNIQUET SINGLE 34IN LL (TOURNIQUET CUFF) ×3 IMPLANT
CUFF TOURNIQUET SINGLE 44IN (TOURNIQUET CUFF) IMPLANT
DRAPE EXTREMITY T 121X128X90 (DRAPE) ×3 IMPLANT
DRAPE U-SHAPE 47X51 STRL (DRAPES) ×3 IMPLANT
DRSG AQUACEL AG ADV 3.5X10 (GAUZE/BANDAGES/DRESSINGS) ×3 IMPLANT
DURAPREP 26ML APPLICATOR (WOUND CARE) ×3 IMPLANT
ELECT REM PT RETURN 9FT ADLT (ELECTROSURGICAL) ×3
ELECTRODE REM PT RTRN 9FT ADLT (ELECTROSURGICAL) ×1 IMPLANT
GLOVE BIO SURGEON STRL SZ7.5 (GLOVE) ×3 IMPLANT
GLOVE BIO SURGEON STRL SZ8.5 (GLOVE) ×1 IMPLANT
GLOVE BIOGEL PI IND STRL 8 (GLOVE) ×1 IMPLANT
GLOVE BIOGEL PI IND STRL 9 (GLOVE) ×1 IMPLANT
GLOVE BIOGEL PI INDICATOR 8 (GLOVE) ×2
GLOVE BIOGEL PI INDICATOR 9 (GLOVE)
GOWN STRL REUS W/ TWL LRG LVL3 (GOWN DISPOSABLE) ×1 IMPLANT
GOWN STRL REUS W/ TWL XL LVL3 (GOWN DISPOSABLE) ×2 IMPLANT
GOWN STRL REUS W/TWL LRG LVL3 (GOWN DISPOSABLE) ×6
GOWN STRL REUS W/TWL XL LVL3 (GOWN DISPOSABLE) ×6
HANDPIECE INTERPULSE COAX TIP (DISPOSABLE) ×3
HOOD PEEL AWAY FACE SHEILD DIS (HOOD) ×6 IMPLANT
KIT BASIN OR (CUSTOM PROCEDURE TRAY) ×3 IMPLANT
KIT ROOM TURNOVER OR (KITS) ×3 IMPLANT
MANIFOLD NEPTUNE II (INSTRUMENTS) ×3 IMPLANT
NEEDLE 22X1 1/2 (OR ONLY) (NEEDLE) ×6 IMPLANT
NS IRRIG 1000ML POUR BTL (IV SOLUTION) ×3 IMPLANT
PACK TOTAL JOINT (CUSTOM PROCEDURE TRAY) ×3 IMPLANT
PAD ARMBOARD 7.5X6 YLW CONV (MISCELLANEOUS) ×6 IMPLANT
SET HNDPC FAN SPRY TIP SCT (DISPOSABLE) ×1 IMPLANT
SUT VIC AB 0 CT1 27 (SUTURE) ×3
SUT VIC AB 0 CT1 27XBRD ANBCTR (SUTURE) ×1 IMPLANT
SUT VIC AB 1 CTX 36 (SUTURE) ×3
SUT VIC AB 1 CTX36XBRD ANBCTR (SUTURE) ×1 IMPLANT
SUT VIC AB 2-0 CT1 27 (SUTURE) ×3
SUT VIC AB 2-0 CT1 TAPERPNT 27 (SUTURE) ×1 IMPLANT
SUT VIC AB 3-0 CT1 27 (SUTURE) ×3
SUT VIC AB 3-0 CT1 TAPERPNT 27 (SUTURE) ×1 IMPLANT
SYR CONTROL 10ML LL (SYRINGE) ×6 IMPLANT
TOWEL OR 17X24 6PK STRL BLUE (TOWEL DISPOSABLE) ×3 IMPLANT
TOWEL OR 17X26 10 PK STRL BLUE (TOWEL DISPOSABLE) ×3 IMPLANT
TRAY CATH 16FR W/PLASTIC CATH (SET/KITS/TRAYS/PACK) IMPLANT
WATER STERILE IRR 1000ML POUR (IV SOLUTION) ×2 IMPLANT

## 2017-05-22 NOTE — Progress Notes (Signed)
Orthopedic Tech Progress Note Patient Details:  Chelsea PorteousFrankie M Benson 10/07/1951 [id number]  Ortho Devices Type of Ortho Device:  (footsie roll) Ortho Device/Splint Interventions: Application   Nikki DomCrawford, Garth Diffley 05/22/2017, 3:23 PM

## 2017-05-22 NOTE — Interval H&P Note (Signed)
History and Physical Interval Note:  05/22/2017 10:13 AM  Chelsea Benson  has presented today for surgery, with the diagnosis of RIGHT KNEE OSTEOARTHRITIS   The various methods of treatment have been discussed with the patient and family. After consideration of risks, benefits and other options for treatment, the patient has consented to  Procedure(s): TOTAL KNEE ARTHROPLASTY (Right) as a surgical intervention .  The patient's history has been reviewed, patient examined, no change in status, stable for surgery.  I have reviewed the patient's chart and labs.  Questions were answered to the patient's satisfaction.     Nestor LewandowskyOWAN,Chauntae Hults J

## 2017-05-22 NOTE — Transfer of Care (Cosign Needed)
Immediate Anesthesia Transfer of Care Note  Patient: Chelsea PorteousFrankie M Benson  Procedure(s) Performed: Procedure(s): TOTAL KNEE ARTHROPLASTY (Right)  Patient Location: PACU  Anesthesia Type:General and GA combined with regional for post-op pain  Level of Consciousness: drowsy and patient cooperative  Airway & Oxygen Therapy: Patient Spontanous Breathing and Patient connected to nasal cannula oxygen  Post-op Assessment: Report given to RN and Post -op Vital signs reviewed and stable  Post vital signs: Reviewed and stable  Last Vitals:  Vitals:   05/22/17 0815 05/22/17 1221  BP: (!) 150/95   Pulse: 98   Resp: 20   Temp: 37.1 C (P) 36.2 C    Last Pain:  Vitals:   05/22/17 1221  TempSrc:   PainSc: (P) Asleep      Patients Stated Pain Goal: 3 (05/22/17 0906)  Complications: No apparent anesthesia complications

## 2017-05-22 NOTE — Op Note (Addendum)
PATIENT ID:      JASMEN EMRICH  MRN:     [id number] DOB/AGE:    1951/05/09 / 66 y.o.       OPERATIVE REPORT    DATE OF PROCEDURE:  05/22/2017       PREOPERATIVE DIAGNOSIS:   RIGHT KNEE OSTEOARTHRITIS       Estimated body mass index is 30.73 kg/m as calculated from the following:   Height as of this encounter:  (1.626 m).   Weight as of this encounter: 81.2 kg (179 lb).                                                        POSTOPERATIVE DIAGNOSIS:   RIGHT KNEE OSTEOARTHRITIS                                                                       PROCEDURE:  Procedure(s): TOTAL KNEE ARTHROPLASTY Using DepuyAttune RP implants #4R Femur, #4Tibia, 5 mm Attune RP bearing, 35 Patella     SURGEON: Durga Saldarriaga J    ASSISTANT:   Sherron Ales, PA student   (Present and scrubbed throughout the case, critical for assistance with exposure, retraction, instrumentation, and closure.)         ANESTHESIA: GET, 20cc Exparel, 50cc 0.25% Marcaine  EBL: 300  FLUID REPLACEMENT: 1500 crystalloid  TOURNIQUET TIME:  Drains: None  Tranexamic Acid: 1gm IV, 2gm topical  COMPLICATIONS:  None         INDICATIONS FOR PROCEDURE: The patient has  RIGHT KNEE OSTEOARTHRITIS , Val deformities, XR shows bone on bone arthritis, lateral subluxation of tibia. Patient has failed all conservative measures including anti-inflammatory medicines, narcotics, attempts at  exercise and weight loss, cortisone injections and viscosupplementation.  Risks and benefits of surgery have been discussed, questions answered.   DESCRIPTION OF PROCEDURE: The patient identified by armband, received  IV antibiotics, in the holding area at [facility]. Patient taken to the operating room, appropriate anesthetic  monitors were attached, and GET anesthesia was  induced. Tourniquet  applied high to the operative thigh. Lateral post and foot positioner  applied to the table, the lower extremity was then prepped and draped  in  usual sterile fashion from the toes to the tourniquet. Time-out procedure was performed. We began the operation, with the knee flexed 120 degrees, by making the anterior midline incision starting at handbreadth above the patella going over the patella 1 cm medial to and 4 cm distal to the tibial tubercle. Small bleeders in the skin and the  subcutaneous tissue identified and cauterized. Transverse retinaculum was incised and reflected medially and a medial parapatellar arthrotomy was accomplished. the patella was everted and theprepatellar fat pad resected. The superficial medial collateral  ligament was then elevated from anterior to posterior along the proximal  flare of the tibia and anterior half of the menisci resected. The knee was hyperflexed exposing bone on bone arthritis. Peripheral and notch osteophytes as well as the cruciate ligaments were then resected. We continued to  work our way around posteriorly along  the proximal tibia, and externally  rotated the tibia subluxing it out from underneath the femur. A McHale  retractor was placed through the notch and a lateral Hohmann retractor  placed, and we then drilled through the proximal tibia in line with the  axis of the tibia followed by an intramedullary guide rod and 2-degree  posterior slope cutting guide. The tibial cutting guide, 3 degree posterior sloped, was pinned into place allowing resection of 8 mm of bone medially and 0 mm of bone laterally. Satisfied with the tibial resection, we then  entered the distal femur 2 mm anterior to the PCL origin with the  intramedullary guide rod and applied the distal femoral cutting guide  set at 9 mm, with 5 degrees of valgus. This was pinned along the  epicondylar axis. At this point, the distal femoral cut was accomplished without difficulty. We then sized for a #4R femoral component and pinned the guide in 0 degrees of external rotation. The chamfer cutting guide was pinned into place. The  anterior, posterior, and chamfer cuts were accomplished without difficulty followed by  the Attune RP box cutting guide and the box cut. We also removed posterior osteophytes from the posterior femoral condyles. At this  time, the knee was brought into full extension. We checked our  extension and flexion gaps and found them symmetric for a 5 mm bearing. Distracting in extension with a lamina spreader, the posterior horns of the menisci were removed, and Exparel, diluted to 60 cc, with 20cc NS, and 20cc 0.5% Marcaine,was injected into the capsule and synovium of the knee. The posterior patella cut was accomplished with the 9.5 mm Attune cutting guide, sized for a 35mm dome, and the fixation pegs drilled.The knee  was then once again hyperflexed exposing the proximal tibia. We sized for a # 4 tibial base plate, applied the smokestack and the conical reamer followed by the the Delta fin keel punch. We then hammered into place the Attune RP trial femoral component, drilled the lugs, inserted a  5 mm trial bearing, trial patellar button, and took the knee through range of motion from 0-130 degrees. No thumb pressure was required for patellar Tracking. At this point, the limb was wrapped with an Esmarch bandage and the tourniquet inflated to 350 mmHg. All trial components were removed, mating surfaces irrigated with pulse lavage, and dried with suction and sponges. 10 cc of the Exparel solution was applied to the cancellus bone of the patella distal femur and proximal tibia.  After waiting 1 minute, the bony surfaces were again, dried with sponges. A double batch of DePuy HV cement with 1500 mg of Zinacef was mixed and applied to all bony metallic mating surfaces except for the posterior condyles of the femur itself. In order, we hammered into place the tibial tray and removed excess cement, the femoral component and removed excess cement. The final Attune RP bearing  was inserted, and the knee brought to full  extension with compression.  The patellar button was clamped into place, and excess cement  removed. While the cement cured the wound was irrigated out with normal saline solution pulse lavage. Ligament stability and patellar tracking were checked and found to be excellent. The parapatellar arthrotomy was closed with  running #1 Vicryl suture. The subcutaneous tissue with 0 and 2-0 undyed  Vicryl suture, and the skin with running 3-0 SQ vicryl. A dressing of Xeroform,  4 x 4, dressing sponges, Webril, and Ace wrap applied. The patient  awakened, and taken to recovery room without difficulty.   Gean Birchwood J 05/22/2017, 12:09 PM

## 2017-05-22 NOTE — Anesthesia Procedure Notes (Signed)
Anesthesia Regional Block: Adductor canal block   Pre-Anesthetic Checklist: ,, timeout performed, Correct Patient, Correct Site, Correct Laterality, Correct Procedure, Correct Position, site marked, Risks and benefits discussed, pre-op evaluation,  At surgeon's request and post-op pain management  Laterality: Right  Prep: Maximum Sterile Barrier Precautions used, chloraprep       Needles:  Injection technique: Single-shot  Needle Type: Echogenic Stimulator Needle     Needle Length: 9cm  Needle Gauge: 21     Additional Needles:   Procedures: ultrasound guided,,,,,,,,  Narrative:  Start time: 05/22/2017 9:32 AM End time: 05/22/2017 9:42 AM Injection made incrementally with aspirations every 5 mL. Anesthesiologist: Gaynelle AduFITZGERALD, Namine Beahm  Additional Notes: 2% Lidocaine skin wheel.

## 2017-05-22 NOTE — Evaluation (Signed)
Physical Therapy Evaluation Patient Details Name: Chelsea Benson MRN: [id number] DOB: 06/02/1951 Today's Date: 05/22/2017   History of Present Illness  Pt is 66 y/o female s/p elective R TKA secondary to R knee OA. PMH includes anxiety, CKD, DM, HTN, leep apnea, s/p lumbar fusion, s/p removal of great toe, and s/p bilat carpal tunnel release.   Clinical Impression  Pt s/p surgery above with deficits below. PTA, pt was using rollator for mobility. Upon evaluation, pt limited by post op pain and weakness as well as decreased balance. Pt requiring min A for functional mobility tasks this session. Reports she will be going to SNF at d/c secondary to decreased support at home. Recommending SNF at d/c to increase functional mobility independence. Will continue to follow acutely to maximize functional mobility independence.     Follow Up Recommendations SNF    Equipment Recommendations  None recommended by PT    Recommendations for Other Services       Precautions / Restrictions Precautions Precautions: Knee Precaution Booklet Issued: Yes (comment) Precaution Comments: /reviewed supine ther ex with pt  Restrictions Weight Bearing Restrictions: Yes RLE Weight Bearing: Weight bearing as tolerated      Mobility  Bed Mobility Overal bed mobility: Needs Assistance Bed Mobility: Supine to Sit;Sit to Supine     Supine to sit: Supervision;HOB elevated Sit to supine: Supervision;HOB elevated   General bed mobility comments: Supervision for safety. Required elevated HOB and use of bed rails.   Transfers Overall transfer level: Needs assistance Equipment used: Rolling walker (2 wheeled) Transfers: Sit to/from Stand Sit to Stand: Min assist         General transfer comment: Min A for steadying upon standing. Verbal cues for safe hand placement.   Ambulation/Gait Ambulation/Gait assistance: Min assist Ambulation Distance (Feet): 30 Feet Assistive device: Rolling walker (2  wheeled) Gait Pattern/deviations: Step-to pattern;Decreased step length - right;Decreased weight shift to right;Antalgic;Trunk flexed Gait velocity: Decreased Gait velocity interpretation: Below normal speed for age/gender General Gait Details: Slow, antalgic gait secondary to post op pain and weakness. Min A for steadying. Verbal cues for sequencing using RW. Limited by pain.   Stairs            Wheelchair Mobility    Modified Rankin (Stroke Patients Only)       Balance Overall balance assessment: Needs assistance Sitting-balance support: No upper extremity supported;Feet supported Sitting balance-Leahy Scale: Good     Standing balance support: Bilateral upper extremity supported;During functional activity Standing balance-Leahy Scale: Poor Standing balance comment: Reliant on RW for stability                              Pertinent Vitals/Pain Pain Assessment: 0-10 Pain Score: 6  Pain Location: R knee  Pain Descriptors / Indicators: Aching;Operative site guarding;Sore Pain Intervention(s): Limited activity within patient's tolerance;Monitored during session;Repositioned    Home Living Family/patient expects to be discharged to:: Skilled nursing facility                      Prior Function Level of Independence: Independent with assistive device(s)         Comments: Used rollator for mobility      Hand Dominance   Dominant Hand: Right    Extremity/Trunk Assessment   Upper Extremity Assessment Upper Extremity Assessment: Defer to OT evaluation    Lower Extremity Assessment Lower Extremity Assessment: RLE deficits/detail RLE Deficits / Details: Sensory  in tact. Deficits consistent with post op pain and weakness. Able to do exercises below.     Cervical / Trunk Assessment Cervical / Trunk Assessment: Other exceptions Cervical / Trunk Exceptions: PMH of lumbar fusion   Communication   Communication: No difficulties  Cognition  Arousal/Alertness: Awake/alert Behavior During Therapy: WFL for tasks assessed/performed Overall Cognitive Status: Within Functional Limits for tasks assessed                                        General Comments General comments (skin integrity, edema, etc.): Pt reports she will be going SNF at d/c secondary to decreased support at home     Exercises Total Joint Exercises Ankle Circles/Pumps: AROM;Both;10 reps;Supine Quad Sets: AROM;Right;10 reps;Supine Towel Squeeze: AROM;Both;10 reps;Supine Heel Slides: AROM;Both;10 reps;Supine Hip ABduction/ADduction: AROM;Both;10 reps;Supine   Assessment/Plan    PT Assessment Patient needs continued PT services  PT Problem List Decreased strength;Decreased range of motion;Decreased activity tolerance;Decreased balance;Decreased mobility;Decreased knowledge of use of DME;Decreased knowledge of precautions;Pain       PT Treatment Interventions DME instruction;Gait training;Functional mobility training;Therapeutic exercise;Therapeutic activities;Balance training;Neuromuscular re-education;Patient/family education    PT Goals (Current goals can be found in the Care Plan section)  Acute Rehab PT Goals Patient Stated Goal: to go to rehab to get stronger and then go home  PT Goal Formulation: With patient Time For Goal Achievement: 05/29/17 Potential to Achieve Goals: Good    Frequency 7X/week   Barriers to discharge Decreased caregiver support no assist at home     Co-evaluation               AM-PAC PT "6 Clicks" Daily Activity  Outcome Measure Difficulty turning over in bed (including adjusting bedclothes, sheets and blankets)?: A Little Difficulty moving from lying on back to sitting on the side of the bed? : A Lot Difficulty sitting down on and standing up from a chair with arms (e.g., wheelchair, bedside commode, etc,.)?: Total Help needed moving to and from a bed to chair (including a wheelchair)?: A  Little Help needed walking in hospital room?: A Little Help needed climbing 3-5 steps with a railing? : A Lot 6 Click Score: 14    End of Session Equipment Utilized During Treatment: Gait belt Activity Tolerance: Patient limited by pain Patient left: in bed;with call bell/phone within reach (requesting to go to bed secondary to sleepiness) Nurse Communication: Mobility status PT Visit Diagnosis: Other abnormalities of gait and mobility (R26.89);Pain Pain - Right/Left: Right Pain - part of body: Knee    Time: 1745-1820 PT Time Calculation (min) (ACUTE ONLY): 35 min   Charges:   PT Evaluation $PT Eval Low Complexity: 1 Procedure PT Treatments $Gait Training: 8-22 mins   PT G Codes:        Gladys Damme, PT, DPT  Acute Rehabilitation Services  Pager: [phone]   Lehman Prom 05/22/2017, 7:06 PM

## 2017-05-22 NOTE — Anesthesia Postprocedure Evaluation (Signed)
Anesthesia Post Note  Patient: Horace PorteousFrankie M Dobek  Procedure(s) Performed: Procedure(s) (LRB): TOTAL KNEE ARTHROPLASTY (Right)     Patient location during evaluation: PACU Anesthesia Type: General and Regional Level of consciousness: awake and alert Pain management: pain level controlled Vital Signs Assessment: post-procedure vital signs reviewed and stable Respiratory status: spontaneous breathing, nonlabored ventilation, respiratory function stable and patient connected to nasal cannula oxygen Cardiovascular status: blood pressure returned to baseline and stable Postop Assessment: no signs of nausea or vomiting Anesthetic complications: no    Last Vitals:  Vitals:   05/22/17 1404 05/22/17 1419  BP: (!) 151/101 116/78  Pulse: 80 88  Resp: 14 14  Temp:      Last Pain:  Vitals:   05/22/17 1450  TempSrc:   PainSc: Asleep        RLE Motor Response: Responds to commands (05/22/17 1450)        Berneda Piccininni,W. EDMOND

## 2017-05-22 NOTE — Anesthesia Procedure Notes (Signed)
Procedure Name: LMA Insertion Date/Time: 05/22/2017 10:23 AM Performed by: Jed LimerickHARDER, Cicero Noy S Pre-anesthesia Checklist: Patient identified, Emergency Drugs available, Suction available and Patient being monitored Patient Re-evaluated:Patient Re-evaluated prior to inductionOxygen Delivery Method: Circle System Utilized Preoxygenation: Pre-oxygenation with 100% oxygen Intubation Type: IV induction Ventilation: Mask ventilation without difficulty LMA: LMA inserted LMA Size: 4.0 Number of attempts: 1 Airway Equipment and Method: Bite block Placement Confirmation: positive ETCO2 Tube secured with: Tape Dental Injury: Teeth and Oropharynx as per pre-operative assessment  Comments: Inserted by Janean Sarkdaniel ganchev, srna

## 2017-05-23 ENCOUNTER — Encounter (HOSPITAL_COMMUNITY): Payer: Self-pay | Admitting: Orthopedic Surgery

## 2017-05-23 LAB — BASIC METABOLIC PANEL
ANION GAP: 8 (ref 5–15)
BUN: 14 (ref 4–21)
BUN: 14 mg/dL (ref 6–20)
CHLORIDE: 105 mmol/L (ref 101–111)
CO2: 21 mmol/L — AB (ref 22–32)
Calcium: 8.8 mg/dL — ABNORMAL LOW (ref 8.9–10.3)
Creatinine, Ser: 0.67 mg/dL (ref 0.44–1.00)
Creatinine: 0.7 (ref 0.5–1.1)
GFR calc Af Amer: >60 mL/min (ref >60)
GFR calc non Af Amer: >60 mL/min (ref >60)
GLUCOSE: 253
Glucose, Bld: 253 mg/dL — ABNORMAL HIGH (ref 65–99)
POTASSIUM: 4.6 mmol/L (ref 3.5–5.1)
SODIUM: 134 — AB (ref 137–147)
Sodium: 134 mmol/L — ABNORMAL LOW (ref 135–145)

## 2017-05-23 LAB — GLUCOSE, CAPILLARY
GLUCOSE-CAPILLARY: 217 mg/dL — AB (ref 65–99)
Glucose-Capillary: 262 mg/dL — ABNORMAL HIGH (ref 65–99)
Glucose-Capillary: 285 mg/dL — ABNORMAL HIGH (ref 65–99)
Glucose-Capillary: 281 mg/dL — ABNORMAL HIGH (ref 65–99)

## 2017-05-23 LAB — CBC
HEMATOCRIT: 35.6 % — AB (ref 36.0–46.0)
HEMOGLOBIN: 10.7 g/dL — AB (ref 12.0–15.0)
MCH: 25.6 pg — ABNORMAL LOW (ref 26.0–34.0)
MCHC: 30.1 g/dL (ref 30.0–36.0)
MCV: 85.2 fL (ref 78.0–100.0)
Platelets: 305 10*3/uL (ref 150–400)
RBC: 4.18 MIL/uL (ref 3.87–5.11)
RDW: 15.1 % (ref 11.5–15.5)
WBC: 12.1 10*3/uL — AB (ref 4.0–10.5)

## 2017-05-23 LAB — HEMOGLOBIN A1C
Hgb A1c MFr Bld: 8.2 % — ABNORMAL HIGH (ref 4.8–5.6)
Mean Plasma Glucose: 189 mg/dL

## 2017-05-23 LAB — CBC AND DIFFERENTIAL
HCT: 36 (ref 36–46)
Hemoglobin: 10.7 — AB (ref 12.0–16.0)
Platelets: 305 (ref 150–399)
WBC: 12.1

## 2017-05-23 NOTE — Evaluation (Signed)
Occupational Therapy Evaluation Patient Details Name: Chelsea Benson MRN: [id number] DOB: 1951/12/01 Today's Date: 05/23/2017    History of Present Illness Pt is 66 y/o female s/p elective R TKA secondary to R knee OA. PMH includes anxiety, CKD, DM, HTN, leep apnea, s/p lumbar fusion, s/p removal of great toe, and s/p bilat carpal tunnel release.    Clinical Impression   PTA, pt was independent with Rollator for ADL and functional mobility. Pt currently requires mod assist for LB ADL, min assist for standing grooming tasks for balance, and min guard assist for toilet transfers with RW. Pt would benefit from continued OT services while admitted to improve independence with ADL and functional mobility. Pt will not have 24 hour assistance post-acute D/C. She demonstrates significant functional decline from PLOF and is at risk of falling during ADL. Feel pt would best benefit from short-term SNF placement for continued rehabilitation in order to maximize independence with ADL and functional mobility in preparation for return home. OT will continue to follow while admitted.     Follow Up Recommendations  DC plan and follow up therapy as arranged by surgeon    Equipment Recommendations  Other (comment) (TBD at next venue of care.)    Recommendations for Other Services       Precautions / Restrictions Precautions Precautions: Knee Precaution Booklet Issued: No Precaution Comments: Reviewed knee precautions during ADL. Restrictions Weight Bearing Restrictions: Yes RLE Weight Bearing: Weight bearing as tolerated      Mobility Bed Mobility               General bed mobility comments: OOB in chair on OT arrival.   Transfers Overall transfer level: Needs assistance Equipment used: Rolling walker (2 wheeled) Transfers: Sit to/from Stand Sit to Stand: Min guard         General transfer comment: VC's for safety to avoid pulling on RW.     Balance Overall balance assessment:  Needs assistance Sitting-balance support: No upper extremity supported;Feet supported Sitting balance-Leahy Scale: Good     Standing balance support: Bilateral upper extremity supported;During functional activity Standing balance-Leahy Scale: Poor Standing balance comment: Reliant on RW or external assistance for stability.                           ADL either performed or assessed with clinical judgement   ADL Overall ADL's : Needs assistance/impaired Eating/Feeding: Set up;Sitting   Grooming: Minimal assistance;Standing   Upper Body Bathing: Supervision/ safety;Sitting   Lower Body Bathing: Moderate assistance;Sit to/from stand   Upper Body Dressing : Supervision/safety;Sitting   Lower Body Dressing: Moderate assistance;Sit to/from stand   Toilet Transfer: Min guard;Ambulation;RW   Toileting- Architect and Hygiene: Min guard;Sit to/from stand       Functional mobility during ADLs: Min guard;Rolling walker General ADL Comments: Pt educated concerning dressing techniques, use of ice for pain management, fall prevention, and safe toilet transfers.      Vision Patient Visual Report: No change from baseline Vision Assessment?: No apparent visual deficits     Perception     Praxis      Pertinent Vitals/Pain Pain Assessment: Faces Faces Pain Scale: Hurts even more Pain Location: R knee  Pain Descriptors / Indicators: Aching;Operative site guarding;Sore Pain Intervention(s): Monitored during session;Repositioned;Limited activity within patient's tolerance     Hand Dominance Right   Extremity/Trunk Assessment Upper Extremity Assessment Upper Extremity Assessment: Generalized weakness   Lower Extremity Assessment Lower Extremity Assessment:  RLE deficits/detail RLE Deficits / Details: Decreased strength and ROM as expected post-operatively.    Cervical / Trunk Assessment Cervical / Trunk Assessment: Other exceptions Cervical / Trunk  Exceptions: PMH of lumbar fusion    Communication Communication Communication: No difficulties   Cognition Arousal/Alertness: Awake/alert Behavior During Therapy: WFL for tasks assessed/performed Overall Cognitive Status: Within Functional Limits for tasks assessed                                     General Comments       Exercises Exercises: Total Joint Total Joint Exercises Ankle Circles/Pumps: AROM;Both;10 reps;Supine Quad Sets: AROM;Right;10 reps;Supine Towel Squeeze: AROM;Both;10 reps;Supine Heel Slides: AROM;Both;10 reps;Supine Hip ABduction/ADduction: AROM;Both;10 reps;Supine   Shoulder Instructions      Home Living Family/patient expects to be discharged to:: Skilled nursing facility Living Arrangements: Children (and grandson)                                      Prior Functioning/Environment Level of Independence: Independent with assistive device(s)        Comments: Used rollator for mobility         OT Problem List: Decreased strength;Decreased activity tolerance;Impaired balance (sitting and/or standing);Decreased knowledge of use of DME or AE;Decreased knowledge of precautions;Cardiopulmonary status limiting activity;Pain      OT Treatment/Interventions: Self-care/ADL training;Therapeutic exercise;Energy conservation;DME and/or AE instruction;Therapeutic activities;Patient/family education;Balance training    OT Goals(Current goals can be found in the care plan section) Acute Rehab OT Goals Patient Stated Goal: to go to rehab to get stronger and then go home  OT Goal Formulation: With patient Time For Goal Achievement: 06/06/17 Potential to Achieve Goals: Good ADL Goals Pt Will Perform Grooming: with modified independence;standing Pt Will Perform Lower Body Bathing: sit to/from stand;with modified independence Pt Will Perform Lower Body Dressing: sit to/from stand;with modified independence Pt Will Transfer to Toilet:  with modified independence;ambulating;bedside commode (BSC over toilet) Pt Will Perform Toileting - Clothing Manipulation and hygiene: with modified independence;sit to/from stand  OT Frequency: Min 2X/week   Barriers to D/C: Decreased caregiver support          Co-evaluation              AM-PAC PT "6 Clicks" Daily Activity     Outcome Measure Help from another person eating meals?: None Help from another person taking care of personal grooming?: Chelsea Little Help from another person toileting, which includes using toliet, bedpan, or urinal?: Chelsea Little Help from another person bathing (including washing, rinsing, drying)?: Chelsea Lot Help from another person to put on and taking off regular upper body clothing?: Chelsea Little Help from another person to put on and taking off regular lower body clothing?: Chelsea Lot 6 Click Score: 17   End of Session Equipment Utilized During Treatment: Gait belt;Rolling walker Nurse Communication: Mobility status  Activity Tolerance: Patient tolerated treatment well Patient left: in chair;with call bell/phone within reach;with family/visitor present (with respiratory therapist present)  OT Visit Diagnosis: Other abnormalities of gait and mobility (R26.89);Pain Pain - Right/Left: Right Pain - part of body: Knee                Time: [id number] OT Time Calculation (min): 27 min Charges:  OT General Charges $OT Visit: 1 Procedure OT Evaluation $OT Eval Moderate Complexity: 1 Procedure OT Treatments $  Self Care/Home Management : 8-22 mins G-Codes:     Doristine Section, MS OTR/L  Pager: [phone]   Chelsea Benson Chelsea Benson 05/23/2017, 12:53 PM

## 2017-05-23 NOTE — Progress Notes (Signed)
PATIENT ID: Chelsea PorteousFrankie M Benson  MRN: [id number]  DOB/AGE:  04/22/1951 / 66 y.o.  1 Day Post-Op Procedure(s) (LRB): TOTAL KNEE ARTHROPLASTY (Right)    PROGRESS NOTE Subjective: Patient is alert, oriented, no Nausea, no Vomiting, yes passing gas. Taking PO well. Denies SOB, Chest or Calf Pain. Using Incentive Spirometer, PAS in place. Ambulate 30 ft, Patient reports pain as 3/10 .    Objective: Vital signs in last 24 hours: Vitals:   05/22/17 1419 05/22/17 1509 05/22/17 2055 05/23/17 0457  BP: 116/78 113/73 107/73 (!) 146/93  Pulse: 88 96 98 99  Resp: 14 16 18 20   Temp:  97.5 F (36.4 C) 98.1 F (36.7 C) 98.4 F (36.9 C)  TempSrc:  Oral Oral Oral  SpO2: 99% 100% 99% 99%  Weight:      Height:          Intake/Output from previous day: I/O last 3 completed shifts: In: 4216.3 [P.O.:960; I.V.:3156.3; IV Piggyback:100] Out: 300 [Blood:300]   Intake/Output this shift: No intake/output data recorded.   LABORATORY DATA:  Recent Labs  05/22/17 1644 05/22/17 2217 05/23/17 0426 05/23/17 0630  WBC  --   --  12.1*  --   HGB  --   --  10.7*  --   HCT  --   --  35.6*  --   PLT  --   --  305  --   NA  --   --  134*  --   K  --   --  4.6  --   CL  --   --  105  --   CO2  --   --  21*  --   BUN  --   --  14  --   CREATININE  --   --  0.67  --   GLUCOSE  --   --  253*  --   GLUCAP 372* 225*  --  262*  CALCIUM  --   --  8.8*  --     Examination: Neurologically intact ABD soft Neurovascular intact Sensation intact distally Intact pulses distally Dorsiflexion/Plantar flexion intact Incision: scant drainage No cellulitis present Compartment soft}  Assessment:   1 Day Post-Op Procedure(s) (LRB): TOTAL KNEE ARTHROPLASTY (Right) ADDITIONAL DIAGNOSIS: Expected Acute Blood Loss Anemia, Diabetes and Hypertension  Plan: PT/OT WBAT, AROM and PROM  DVT Prophylaxis:  SCDx72hrs, ASA 325 mg BID x 2 weeks DISCHARGE PLAN: Skilled Nursing Facility/Rehab, probably 8/20 DISCHARGE  NEEDS: HHPT, Walker and 3-in-1 comode seat     Symphany Fleissner J 05/23/2017, 7:14 AM Patient ID: Chelsea PorteousFrankie M Benson, female   DOB: 03/01/1951, 66 y.o.   MRN: [id number]

## 2017-05-23 NOTE — Progress Notes (Signed)
Physical Therapy Treatment Patient Details Name: Chelsea Benson MRN: [id number] DOB: 1950-12-20 Today's Date: 05/23/2017    History of Present Illness Pt is 66 y/o female s/p elective R TKA secondary to R knee OA. PMH includes anxiety, CKD, DM, HTN, leep apnea, s/p lumbar fusion, s/p removal of great toe, and s/p bilat carpal tunnel release.     PT Comments    Pt performed increased mobility during session.  Pt will benefit from skilled nursing placement to progress gait training and functional mobility before returning to private residence.  Pt with increased pain and require IV meds for break through post tx.     Follow Up Recommendations  SNF     Equipment Recommendations  None recommended by PT    Recommendations for Other Services       Precautions / Restrictions Precautions Precautions: Knee Precaution Booklet Issued: No Precaution Comments: Reviewed knee precautions during ADL. Restrictions Weight Bearing Restrictions: Yes RLE Weight Bearing: Weight bearing as tolerated    Mobility  Bed Mobility Overal bed mobility: Needs Assistance Bed Mobility: Supine to Sit;Sit to Supine     Supine to sit: Min assist Sit to supine: Min assist   General bed mobility comments: Pt required assist with RLE secondary to pain.    Transfers Overall transfer level: Needs assistance Equipment used: Rolling walker (2 wheeled) Transfers: Sit to/from Stand Sit to Stand: Min guard         General transfer comment: VC's for safety to avoid pulling on RW. Cues for hand placement to and from seated surface.   Ambulation/Gait Ambulation/Gait assistance: Min assist Ambulation Distance (Feet): 50 Feet Assistive device: Rolling walker (2 wheeled) Gait Pattern/deviations: Step-to pattern;Decreased stride length;Antalgic;Decreased weight shift to right;Trunk flexed Gait velocity: Decreased Gait velocity interpretation: Below normal speed for age/gender General Gait Details: Pt  performed increased gait but  required cues for encouragement to advance gait distance.     Stairs            Wheelchair Mobility    Modified Rankin (Stroke Patients Only)       Balance Overall balance assessment: Needs assistance Sitting-balance support: No upper extremity supported;Feet supported Sitting balance-Leahy Scale: Good       Standing balance-Leahy Scale: Poor Standing balance comment: Reliant on RW or external assistance for stability.                            Cognition Arousal/Alertness: Awake/alert Behavior During Therapy: WFL for tasks assessed/performed Overall Cognitive Status: Within Functional Limits for tasks assessed                                        Exercises Total Joint Exercises Ankle Circles/Pumps: AROM;Both;10 reps;Supine Quad Sets: AROM;Right;10 reps;Supine Towel Squeeze: AROM;Both;10 reps;Supine Heel Slides: AAROM;Right;10 reps;Supine Hip ABduction/ADduction: AROM;10 reps;Supine;Right Straight Leg Raises: AROM;Right;10 reps;Supine Goniometric ROM: 50 degrees knee flexion in R knee.      General Comments        Pertinent Vitals/Pain Pain Assessment: 0-10 Pain Score: 7  Faces Pain Scale: Hurts even more Pain Location: R knee  Pain Descriptors / Indicators: Aching;Operative site guarding;Sore Pain Intervention(s): Monitored during session;Repositioned;Ice applied;Patient requesting pain meds-RN notified;RN gave pain meds during session (RN gave IV pain meds during tx.  )    Home Living Family/patient expects to be discharged to:: Other (Comment) (Rehab) Living  Arrangements: Other relatives                  Prior Function            PT Goals (current goals can now be found in the care plan section) Acute Rehab PT Goals Patient Stated Goal: to go to rehab to get stronger and then go home  Potential to Achieve Goals: Good Progress towards PT goals: Progressing toward goals     Frequency    7X/week      PT Plan Current plan remains appropriate    Co-evaluation              AM-PAC PT "6 Clicks" Daily Activity  Outcome Measure  Difficulty turning over in bed (including adjusting bedclothes, sheets and blankets)?: A Little Difficulty moving from lying on back to sitting on the side of the bed? : A Lot Difficulty sitting down on and standing up from a chair with arms (e.g., wheelchair, bedside commode, etc,.)?: Total Help needed moving to and from a bed to chair (including a wheelchair)?: A Little Help needed walking in hospital room?: A Little Help needed climbing 3-5 steps with a railing? : A Lot 6 Click Score: 14    End of Session Equipment Utilized During Treatment: Gait belt Activity Tolerance: Patient limited by pain Patient left: in chair Nurse Communication: Mobility status PT Visit Diagnosis: Other abnormalities of gait and mobility (R26.89);Pain Pain - Right/Left: Right Pain - part of body: Knee     Time: 1541-1606 PT Time Calculation (min) (ACUTE ONLY): 25 min  Charges:  $Gait Training: 8-22 mins $Therapeutic Exercise: 8-22 mins                    G Codes:     Joycelyn Ruaimee Mayara Paulson, PTA pager [phone]   Florestine Aversimee J Anothony Bursch 05/23/2017, 4:18 PM

## 2017-05-23 NOTE — Progress Notes (Signed)
Inpatient Diabetes Program Recommendations  AACE/ADA: New Consensus Statement on Inpatient Glycemic Control (2015)  Target Ranges:  Prepandial:   less than 140 mg/dL      Peak postprandial:   less than 180 mg/dL (1-2 hours)      Critically ill patients:  140 - 180 mg/dL   Lab Results  Component Value Date   GLUCAP 217 (H) 05/23/2017   HGBA1C 8.2 (H) 05/22/2017    Review of Glycemic Control Results for Chelsea Benson, Chelsea Benson (MRN [id number]) as of 05/23/2017 13:34  Ref. Range 05/22/2017 15:12 05/22/2017 16:44 05/22/2017 22:17 05/23/2017 06:30 05/23/2017 11:43  Glucose-Capillary Latest Ref Range: 65 - 99 mg/dL 469249 (H) 629372 (H) 528225 (H) 262 (H) 217 (H)   Diabetes history: DM2 Outpatient Diabetes medications: Janumet 5-1gm bid Current orders for Inpatient glycemic control: Novolog correction 0-15 units tid + Tradjenta 5 mg + Metformin 500 mg bid  Inpatient Diabetes Program Recommendations:  Noted hyperglycemia 217-372. Please consider: -Lantus 16 units qd -Increase Novolog correction to resistant tid  -Add Novolog 0-5 units hs correction  Thank you, Darel HongJudy E. Luvern Mcisaac, RN, MSN, CDE  Diabetes Coordinator Inpatient Glycemic Control Team Team Pager [phone] (8am-5pm) 05/23/2017 1:38 PM

## 2017-05-23 NOTE — Social Work (Signed)
[facility] insurance [facility] for [facility]F placement: Chelsea Benson.

## 2017-05-23 NOTE — Clinical Social Work Note (Signed)
Clinical Social Work Assessment  Patient Details  Name: Chelsea Benson MRN: [id number] Date of Birth: 1951-04-08  Date of referral:  05/23/17               Reason for consult:  Facility Placement                Permission sought to share information with:    Permission granted to share information::  Yes, Verbal Permission Granted  Name::        Agency::  SNF  Relationship::     Contact Information:     Housing/Transportation Living arrangements for the past 2 months:  Single Family Home Source of Information:  Patient Patient Interpreter Needed:  None Criminal Activity/Legal Involvement Pertinent to Current Situation/Hospitalization:  No - Comment as needed Significant Relationships:  Other Family Members Lives with:  Relatives, Self Do you feel safe going back to the place where you live?  No Need for family participation in patient care:  No (Coment)  Care giving concerns:  Patient resided at home with family prior to hospitalization. She used a cane, rollator and 3-in-1 device for ambulation. Patient was not safe to return home at this time.  Social Worker assessment / plan:  CSW met with patient to discuss recommendations from clinical team at DC to get rehabilitation therapy.  CSW explained her role and discussed SNF options/placement. CSW obtained verbal permission to send out to local area.  CSW discussed with patient that CSW would need to obtain insurance auth prior to placement at [facility].  FL2 complete. Passr obtained. Offer sent.  Employment status:  Disabled (Comment on whether or not currently receiving Disability) Insurance information:  Managed Medicare PT Recommendations:  [place] / Referral to community resources:  [place]  Patient/Family's Response to care:  Patient appreciative of CSW assistance with SNF placement. No issues or concerns at this time.  Patient/Family's Understanding of and Emotional Response to  Diagnosis, Current Treatment, and Prognosis:  Patient has good understanding of diagnosis, current treatment and prognosis. Patient hopeful that rehabilitation therapy will assist with physical impairment. No issues or concerns identified at this time.  Emotional Assessment Appearance:  Appears stated age Attitude/Demeanor/Rapport:   (Cooperative) Affect (typically observed):  Accepting, Appropriate Orientation:  Oriented to Self, Oriented to Place, Oriented to  Time, Oriented to Situation Alcohol / Substance use:  Not Applicable Psych involvement (Current and /or in the community):  No (Comment)  Discharge Needs  Concerns to be addressed:  Care Coordination Readmission within the last 30 days:  No Current discharge risk:  Dependent with Mobility, Physical Impairment Barriers to Discharge:  No Barriers Identified   Normajean Baxter, LCSW 05/23/2017, 2:55 PM

## 2017-05-23 NOTE — Social Work (Signed)
CSW selected [facility].  [facility].

## 2017-05-23 NOTE — [place] FL2 (Signed)
[place] MEDICAID FL2 LEVEL OF CARE SCREENING TOOL     IDENTIFICATION  Patient Name: Chelsea Benson Birthdate: 01-Nov-1951 Sex: female Admission Date (Current Location): 05/22/2017  [facility] and [place] Number:  Producer, television/film/video and Address:  [facility], [address], [place], [place] [address]      Provider Number: [id number]  Attending Physician Name and Address:  Gean Birchwood, MD  Relative Name and Phone Number:       Current Level of Care: Hospital Recommended Level of Care: Skilled Nursing Facility Prior Approval Number:    Date Approved/Denied: 05/23/17 PASRR Number: [id number]  Discharge Plan: SNF    Current Diagnoses: Patient Active Problem List   Diagnosis Date Noted  . Primary osteoarthritis of right knee 05/22/2017  . Osteoarthritis of right knee 05/18/2017    Orientation RESPIRATION BLADDER Height & Weight     Self, Time, Situation, Place  Normal Continent Weight: 179 lb (81.2 kg) Height:  5\' 4"  (162.6 cm)  BEHAVIORAL SYMPTOMS/MOOD NEUROLOGICAL BOWEL NUTRITION STATUS      Continent Diet (See DC Summary)  AMBULATORY STATUS COMMUNICATION OF NEEDS Skin   Limited Assist Verbally Surgical wounds (Closed Incision Right Knee with Compression wrap)                       Personal Care Assistance Level of Assistance  Bathing, Feeding, Dressing Bathing Assistance: Limited assistance Feeding assistance: Independent Dressing Assistance: Limited assistance     Functional Limitations Info  Sight, Hearing, Speech Sight Info: Adequate Hearing Info: Adequate Speech Info: Adequate    SPECIAL CARE FACTORS FREQUENCY  PT (By licensed PT), OT (By licensed OT)     PT Frequency: 5xweek OT Frequency: 5xweek            Contractures      Additional Factors Info  Code Status, Allergies, Psychotropic, Insulin Sliding Scale Code Status Info: Full  Allergies Info: COZAAR, CRESTOR ROSUVASTATIN CALCIUM, ALMOND MEAL, ALMOND  OIL  Psychotropic Info: Remeron, Buspoar, Zolpidem Insulin Sliding Scale Info: 15 units 3x's a day       Current Medications (05/23/2017):  This is the current hospital active medication list Current Facility-Administered Medications  Medication Dose Route Frequency Provider Last Rate Last Dose  . acetaminophen (TYLENOL) tablet 650 mg  650 mg Oral Q6H PRN Gean Birchwood, MD   650 mg at 05/22/17 1529   Or  . acetaminophen (TYLENOL) suppository 650 mg  650 mg Rectal Q6H PRN Gean Birchwood, MD      . albuterol (PROVENTIL) (2.5 MG/3ML) 0.083% nebulizer solution 2.5 mg  2.5 mg Nebulization Q4H PRN Gean Birchwood, MD      . alum & mag hydroxide-simeth (MAALOX/MYLANTA) 200-200-20 MG/5ML suspension 30 mL  30 mL Oral Q4H PRN Gean Birchwood, MD      . aspirin EC tablet 325 mg  325 mg Oral Q breakfast Gean Birchwood, MD   325 mg at 05/23/17 0858  . azelastine (ASTELIN) 0.1 % nasal spray 1 spray  1 spray Each Nare BID Gean Birchwood, MD   1 spray at 05/23/17 0911  . bisacodyl (DULCOLAX) EC tablet 5 mg  5 mg Oral Daily PRN Gean Birchwood, MD      . busPIRone (BUSPAR) tablet 15 mg  15 mg Oral TID Gean Birchwood, MD   15 mg at 05/23/17 0910  . dextrose 5 % and 0.45 % NaCl with KCl 20 mEq/L infusion   Intravenous Continuous Gean Birchwood, MD 125 mL/hr  at 05/22/17 1530    . diltiazem (CARDIZEM CD) 24 hr capsule 120 mg  120 mg Oral Daily Gean Birchwood, MD   120 mg at 05/23/17 0910  . diphenhydrAMINE (BENADRYL) 12.5 MG/5ML elixir 12.5-25 mg  12.5-25 mg Oral Q4H PRN Gean Birchwood, MD      . docusate sodium (COLACE) capsule 100 mg  100 mg Oral BID Gean Birchwood, MD   100 mg at 05/23/17 0911  . hydrochlorothiazide (HYDRODIURIL) tablet 25 mg  25 mg Oral Daily Gean Birchwood, MD   25 mg at 05/23/17 0910  . HYDROmorphone (DILAUDID) injection 0.5 mg  0.5 mg Intravenous Q2H PRN Gean Birchwood, MD      . insulin aspart (novoLOG) injection 0-15 Units  0-15 Units Subcutaneous TID WC Gean Birchwood, MD   8 Units at 05/23/17 0636  . linagliptin  (TRADJENTA) tablet 5 mg  5 mg Oral Daily Gean Birchwood, MD   5 mg at 05/23/17 0911  . loratadine (CLARITIN) tablet 10 mg  10 mg Oral Daily Gean Birchwood, MD   10 mg at 05/23/17 0911  . menthol-cetylpyridinium (CEPACOL) lozenge 3 mg  1 lozenge Oral PRN Gean Birchwood, MD       Or  . phenol (CHLORASEPTIC) mouth spray 1 spray  1 spray Mouth/Throat PRN Gean Birchwood, MD      . metFORMIN (GLUCOPHAGE) tablet 500 mg  500 mg Oral BID WC Gean Birchwood, MD   500 mg at 05/23/17 0858  . methocarbamol (ROBAXIN) tablet 500 mg  500 mg Oral Q6H PRN Gean Birchwood, MD   500 mg at 05/22/17 2239   Or  . methocarbamol (ROBAXIN) 500 mg in dextrose 5 % 50 mL IVPB  500 mg Intravenous Q6H PRN Gean Birchwood, MD      . metoCLOPramide (REGLAN) tablet 5-10 mg  5-10 mg Oral Q8H PRN Gean Birchwood, MD       Or  . metoCLOPramide (REGLAN) injection 5-10 mg  5-10 mg Intravenous Q8H PRN Gean Birchwood, MD      . mirtazapine (REMERON) tablet 30 mg  30 mg Oral QHS Gean Birchwood, MD   30 mg at 05/22/17 2236  . mometasone-formoterol (DULERA) 200-5 MCG/ACT inhaler 2 puff  2 puff Inhalation BID Gean Birchwood, MD   2 puff at 05/23/17 0956  . montelukast (SINGULAIR) tablet 10 mg  10 mg Oral QHS Gean Birchwood, MD   10 mg at 05/22/17 2237  . ondansetron (ZOFRAN) tablet 4 mg  4 mg Oral Q6H PRN Gean Birchwood, MD       Or  . ondansetron [facility]) injection 4 mg  4 mg Intravenous Q6H PRN Gean Birchwood, MD      . OXcarbazepine (TRILEPTAL) tablet 150 mg  150 mg Oral TID Gean Birchwood, MD   150 mg at 05/23/17 0911  . oxybutynin (DITROPAN-XL) 24 hr tablet 10 mg  10 mg Oral QHS Gean Birchwood, MD      . oxyCODONE (Oxy IR/ROXICODONE) immediate release tablet 5-10 mg  5-10 mg Oral Q3H PRN Gean Birchwood, MD   10 mg at 05/23/17 0910  . potassium chloride SA (K-DUR,KLOR-CON) CR tablet 20 mEq  20 mEq Oral Daily Gean Birchwood, MD   20 mEq at 05/23/17 0910  . pravastatin (PRAVACHOL) tablet 20 mg  20 mg Oral QPM Gean Birchwood, MD   20 mg at 05/22/17 1638  . pregabalin (LYRICA)  capsule 100 mg  100 mg Oral TID Gean Birchwood, MD   100 mg at 05/23/17 0910  . senna-docusate (Senokot-S)  tablet 1 tablet  1 tablet Oral QHS PRN Gean Birchwoodowan, Frank, MD      . sodium phosphate (FLEET) 7-19 GM/118ML enema 1 enema  1 enema Rectal Once PRN Gean Birchwoodowan, Frank, MD      . tiotropium [facility](SPIRIVA) inhalation capsule 18 mcg  18 mcg Inhalation Daily Gean Birchwoodowan, Frank, MD   18 mcg at 05/23/17 0956  . zolpidem (AMBIEN) tablet 5 mg  5 mg Oral QHS Gean Birchwoodowan, Frank, MD         Discharge Medications: Please see discharge summary for a list of discharge medications.  Relevant Imaging Results:  Relevant Lab Results:   Additional Information SS: [id number]  Tresa MoorePatricia V Osama Coleson, LCSW

## 2017-05-24 DIAGNOSIS — E118 Type 2 diabetes mellitus with unspecified complications: Secondary | ICD-10-CM | POA: Diagnosis not present

## 2017-05-24 DIAGNOSIS — E1169 Type 2 diabetes mellitus with other specified complication: Secondary | ICD-10-CM | POA: Insufficient documentation

## 2017-05-24 DIAGNOSIS — D62 Acute posthemorrhagic anemia: Secondary | ICD-10-CM | POA: Diagnosis not present

## 2017-05-24 DIAGNOSIS — E119 Type 2 diabetes mellitus without complications: Secondary | ICD-10-CM | POA: Insufficient documentation

## 2017-05-24 DIAGNOSIS — M6281 Muscle weakness (generalized): Secondary | ICD-10-CM | POA: Diagnosis not present

## 2017-05-24 DIAGNOSIS — E785 Hyperlipidemia, unspecified: Secondary | ICD-10-CM | POA: Insufficient documentation

## 2017-05-24 DIAGNOSIS — Z471 Aftercare following joint replacement surgery: Secondary | ICD-10-CM | POA: Diagnosis not present

## 2017-05-24 DIAGNOSIS — N189 Chronic kidney disease, unspecified: Secondary | ICD-10-CM | POA: Diagnosis not present

## 2017-05-24 DIAGNOSIS — I129 Hypertensive chronic kidney disease with stage 1 through stage 4 chronic kidney disease, or unspecified chronic kidney disease: Secondary | ICD-10-CM | POA: Diagnosis not present

## 2017-05-24 DIAGNOSIS — Z96651 Presence of right artificial knee joint: Secondary | ICD-10-CM | POA: Diagnosis not present

## 2017-05-24 DIAGNOSIS — R262 Difficulty in walking, not elsewhere classified: Secondary | ICD-10-CM | POA: Diagnosis not present

## 2017-05-24 DIAGNOSIS — J4520 Mild intermittent asthma, uncomplicated: Secondary | ICD-10-CM | POA: Diagnosis not present

## 2017-05-24 DIAGNOSIS — Z9889 Other specified postprocedural states: Secondary | ICD-10-CM | POA: Diagnosis not present

## 2017-05-24 DIAGNOSIS — M1711 Unilateral primary osteoarthritis, right knee: Secondary | ICD-10-CM | POA: Diagnosis not present

## 2017-05-24 DIAGNOSIS — R531 Weakness: Secondary | ICD-10-CM | POA: Diagnosis not present

## 2017-05-24 DIAGNOSIS — G4700 Insomnia, unspecified: Secondary | ICD-10-CM | POA: Insufficient documentation

## 2017-05-24 DIAGNOSIS — G894 Chronic pain syndrome: Secondary | ICD-10-CM | POA: Diagnosis not present

## 2017-05-24 DIAGNOSIS — F419 Anxiety disorder, unspecified: Secondary | ICD-10-CM | POA: Diagnosis not present

## 2017-05-24 DIAGNOSIS — I10 Essential (primary) hypertension: Secondary | ICD-10-CM | POA: Diagnosis not present

## 2017-05-24 LAB — GLUCOSE, CAPILLARY
GLUCOSE-CAPILLARY: 295 mg/dL — AB (ref 65–99)
GLUCOSE-CAPILLARY: 316 mg/dL — AB (ref 65–99)

## 2017-05-24 LAB — CBC
HCT: 38.4 % (ref 36.0–46.0)
HEMOGLOBIN: 11.9 g/dL — AB (ref 12.0–15.0)
MCH: 25.6 pg — AB (ref 26.0–34.0)
MCHC: 31 g/dL (ref 30.0–36.0)
MCV: 82.8 fL (ref 78.0–100.0)
Platelets: 321 10*3/uL (ref 150–400)
RBC: 4.64 MIL/uL (ref 3.87–5.11)
RDW: 14.8 % (ref 11.5–15.5)
WBC: 13.1 10*3/uL — ABNORMAL HIGH (ref 4.0–10.5)

## 2017-05-24 LAB — CBC AND DIFFERENTIAL: WBC: 13.1

## 2017-05-24 MED ORDER — ASPIRIN EC 325 MG PO TBEC: 325.0000 mg | DELAYED_RELEASE_TABLET | Freq: Two times a day (BID) | ORAL | 0 refills | Status: DC

## 2017-05-24 MED ORDER — OXYCODONE-ACETAMINOPHEN 5-325 MG PO TABS: 1.0000 | ORAL_TABLET | ORAL | 0 refills | Status: DC | PRN

## 2017-05-24 NOTE — Discharge Summary (Signed)
Patient ID: Chelsea Benson MRN: [id number] DOB/AGE: 1951-09-04 66 y.o.  Admit date: 05/22/2017 Discharge date: 05/24/2017  Admission Diagnoses:  Principal Problem:   Osteoarthritis of right knee Active Problems:   Primary osteoarthritis of right knee   Discharge Diagnoses:  Same  Past Medical History:  Diagnosis Date  . Allergic rhinoconjunctivitis   . Anxiety   . Arthritis    DDD, spondylosis  . Asthma   . Chronic kidney disease    renal calculi  . Depression   . Diabetes mellitus without complication (HCC)    dx in 2007  . Headache(784.0)    sinus related   . History of kidney stones   . Hypertension   . Mental disorder   . Sleep apnea     Surgeries: Procedure(s): TOTAL KNEE ARTHROPLASTY on 05/22/2017   Consultants:   Discharged Condition: Improved  Hospital Course: Chelsea Benson is an 66 y.o. female who was admitted 05/22/2017 for operative treatment ofOsteoarthritis of right knee. Patient has severe unremitting pain that affects sleep, daily activities, and work/hobbies. After pre-op clearance the patient was taken to the operating room on 05/22/2017 and underwent  Procedure(s): TOTAL KNEE ARTHROPLASTY.    Patient was given perioperative antibiotics: Anti-infectives    Start     Dose/Rate Route Frequency Ordered Stop   05/22/17 1900  ceFAZolin (ANCEF) IVPB 2g/100 mL premix  Status:  Discontinued     2 g 200 mL/hr over 30 Minutes Intravenous Every 8 hours 05/22/17 1820 05/22/17 2240   05/22/17 1100  cefUROXime (ZINACEF) injection  Status:  Discontinued       As needed 05/22/17 1101 05/22/17 1213   05/22/17 0930  ceFAZolin (ANCEF) IVPB 2g/100 mL premix     2 g 200 mL/hr over 30 Minutes Intravenous To ShortStay Surgical 05/19/17 0849 05/22/17 1022       Patient was given sequential compression devices, early ambulation, and chemoprophylaxis to prevent DVT.  Patient benefited maximally from hospital stay and there were no complications.  Ambulate 32'  prior to D/C, scant drainage to wound  Recent vital signs: Patient Vitals for the past 24 hrs:  BP Temp Temp src Pulse Resp SpO2  05/24/17 0451 (!) 157/100 98 F (36.7 C) Oral (!) 108 18 95 %  05/23/17 2123 (!) 191/109 99.5 F (37.5 C) Oral (!) 111 18 97 %  05/23/17 1300 (!) 141/86 98.7 F (37.1 C) Oral (!) 116 14 -  05/23/17 0959 - - - - - 98 %     Recent laboratory studies:  Recent Labs  05/23/17 0426 05/24/17 0425  WBC 12.1* 13.1*  HGB 10.7* 11.9*  HCT 35.6* 38.4  PLT 305 321  NA 134*  --   K 4.6  --   CL 105  --   CO2 21*  --   BUN 14  --   CREATININE 0.67  --   GLUCOSE 253*  --   CALCIUM 8.8*  --      Discharge Medications:   Allergies as of 05/24/2017      Reactions   Cozaar Cough   Crestor [rosuvastatin Calcium] Other (See Comments)   Makes her heart beat really fast.   Almond Meal Itching, Other (See Comments)   Tongue itches   Almond Oil Itching, Other (See Comments)   Itching of tongue      Medication List    STOP taking these medications   azithromycin 250 MG tablet Commonly known as:  ZITHROMAX Z-PAK     TAKE  these medications   albuterol (2.5 MG/3ML) 0.083% nebulizer solution Commonly known as:  PROVENTIL Take 3 mLs (2.5 mg total) by nebulization every 4 (four) hours as needed for wheezing or shortness of breath.   albuterol 108 (90 Base) MCG/ACT inhaler Commonly known as:  PROVENTIL HFA;VENTOLIN HFA Inhale 2 puffs into the lungs every 4 (four) hours as needed. For wheezing   aspirin EC 325 MG tablet Take 1 tablet (325 mg total) by mouth 2 (two) times daily. What changed:  medication strength  See the new instructions.   azelastine 0.1 % nasal spray Commonly known as:  ASTELIN Use 1 spray in each nostril twice daily   budesonide-formoterol 160-4.5 MCG/ACT inhaler Commonly known as:  SYMBICORT Inhale 2 puffs into the lungs 2 (two) times daily.   busPIRone 15 MG tablet Commonly known as:  BUSPAR Take 15 mg by mouth 3 (three)  times daily.   cyclobenzaprine 10 MG tablet Commonly known as:  FLEXERIL Take 10 mg by mouth 2 (two) times daily as needed for muscle spasms.   diltiazem 120 MG 24 hr capsule Commonly known as:  CARDIZEM CD Take 120 mg by mouth daily.   EPINEPHrine 0.3 mg/0.3 mL Soaj injection Commonly known as:  EPIPEN 2-PAK Use as directed for severe allergic reaction What changed:  how much to take  how to take this  when to take this  reasons to take this  additional instructions   fexofenadine 180 MG tablet Commonly known as:  ALLEGRA Take 180 mg by mouth daily.   hydrochlorothiazide 25 MG tablet Commonly known as:  HYDRODIURIL TAKE 25 MG BY MOUTH DAILY EVERY MORNING   ipratropium 0.06 % nasal spray Commonly known as:  ATROVENT Place 2 sprays into both nostrils 4 (four) times daily.   JANUMET 50-1000 MG tablet Generic drug:  sitaGLIPtin-metformin Take 1 tablet by mouth 2 (two) times daily with a meal.   KLOR-CON M20 20 MEQ tablet Generic drug:  potassium chloride SA Take 20 mEq by mouth daily.   LYRICA 100 MG capsule Generic drug:  pregabalin Take 100 mg by mouth 3 (three) times daily.   mirtazapine 30 MG tablet Commonly known as:  REMERON Take 30 mg by mouth at bedtime.   montelukast 10 MG tablet Commonly known as:  SINGULAIR Take 10 mg by mouth at bedtime.   naproxen 500 MG tablet Commonly known as:  NAPROSYN Take 1 tablet (500 mg total) by mouth 2 (two) times daily.   Olopatadine HCl 0.2 % Soln Apply 1 drop to eye daily.   OXcarbazepine 150 MG tablet Commonly known as:  TRILEPTAL Take 150 mg by mouth 3 (three) times daily.   oxybutynin 10 MG 24 hr tablet Commonly known as:  DITROPAN-XL TAKE 10 MG BY MOUTH DAILY   oxyCODONE-acetaminophen 5-325 MG tablet Commonly known as:  ROXICET Take 1 tablet by mouth every 4 (four) hours as needed for severe pain.   pravastatin 20 MG tablet Commonly known as:  PRAVACHOL Take 20 mg by mouth every evening.    tiotropium 18 MCG inhalation capsule Commonly known as:  SPIRIVA Place 18 mcg into inhaler and inhale daily.   triamcinolone ointment 0.1 % Commonly known as:  KENALOG Apply 1 application topically 2 (two) times daily.   zolpidem 10 MG tablet Commonly known as:  AMBIEN Take 10 mg by mouth at bedtime.            Durable Medical Equipment        Start  Ordered   05/22/17 1503  DME Walker rolling  Once    Question:  Patient needs a walker to treat with the following condition  Answer:  Status post right knee replacement   05/22/17 1503   05/22/17 1503  DME 3 n 1  Once     05/22/17 1503   05/22/17 1503  DME Bedside commode  Once    Question:  Patient needs a bedside commode to treat with the following condition  Answer:  Status post right knee replacement   05/22/17 1503      Diagnostic Studies: Dg Chest 2 View  Result Date: 05/11/2017 CLINICAL DATA:  Cough. Preoperative total knee replacement. Hypertension. EXAM: CHEST  2 VIEW COMPARISON:  October 04, 2012 FINDINGS: There is scarring in each lower lung zone and inferior lingula. There is no edema or consolidation. Heart is upper normal in size with pulmonary vascularity within normal limits. There is prominence of the aortic arch with aortic tortuosity. No adenopathy. No bone lesions. IMPRESSION: Scattered areas scarring bilaterally. No edema or consolidation. Aortic arch region prominence is likely due to chronic hypertensive change. Electronically Signed   By: Bretta BangWilliam  Woodruff III M.D.   On: 05/11/2017 10:05    Disposition: 01-Home or Self Care  Discharge Instructions    Call MD / Call 911    Complete by:  As directed    If you experience chest pain or shortness of breath, CALL 911 and be transported to the hospital emergency room.  If you develope a fever above 101 F, pus (white drainage) or increased drainage or redness at the wound, or calf pain, call your surgeon's office.   Change dressing    Complete by:  As  directed    Change dressing, if > 40% drainage in window   Constipation Prevention    Complete by:  As directed    Drink plenty of fluids.  Prune juice may be helpful.  You may use a stool softener, such as Colace (over the counter) 100 mg twice a day.  Use MiraLax (over the counter) for constipation as needed.   Diet - low sodium heart healthy    Complete by:  As directed    Increase activity slowly as tolerated    Complete by:  As directed    TED hose    Complete by:  As directed    Use stockings (TED hose) for 2 weeks on B leg(s).  You may remove them at night for sleeping.       Contact information for follow-up providers    Gean Birchwoodowan, Lamaj Metoyer, MD Follow up in 2 week(s).   Specialty:  Orthopedic Surgery Contact information: [address] [place] [address] [phone]            Contact information for after-discharge care    Destination    [facility] Follow up.   Specialty:  Skilled Nursing Facility Contact information: [address] [place] [id number] [phone]                   Signed: Nestor LewandowskyROWAN,Evarose Altland J 05/24/2017, 6:33 AM

## 2017-05-24 NOTE — Social Work (Signed)
Clinical Social Worker facilitated patient discharge including contacting patient family and facility to confirm patient discharge plans.  Clinical information faxed to facility and family agreeable with plan.  CSW arranged ambulance transport via [facility] to [facility].  RN to call [phone] for report prior to discharge.  Clinical Social Worker will sign off for now as social work intervention is no longer needed. Please consult us again if new need arises.  Keene BreathPatricia Mara Favero, LCSW Clinical Social Worker [phone]

## 2017-05-24 NOTE — Clinical Social Work Placement (Signed)
   CLINICAL SOCIAL WORK PLACEMENT  NOTE  Date:  05/24/2017  Patient Details  Name: Chelsea Benson MRN: [id number] Date of Birth: 12/19/1950  Clinical Social Work is seeking post-discharge placement for this patient at the Skilled  Nursing Facility level of care (*CSW will initial, date and re-position this form in  chart as items are completed):  Yes   Patient/family provided with [facility] Clinical Social Work Department's list of facilities offering this level of care within the geographic area requested by the patient (or if unable, by the patient's family).  Yes   Patient/family informed of their freedom to choose among providers that offer the needed level of care, that participate in Medicare, Medicaid or managed care program needed by the patient, have an available bed and are willing to accept the patient.  Yes   Patient/family informed of [facility]'s ownership interest in [facility] and [facility], as well as of the fact that they are under no obligation to receive care at these facilities.  PASRR submitted to EDS on       PASRR number received on 05/23/17     Existing PASRR number confirmed on 05/23/17     FL2 transmitted to all facilities in geographic area requested by pt/family on 05/23/17     FL2 transmitted to all facilities within larger geographic area on 05/23/17     Patient informed that his/her managed care company has contracts with or will negotiate with certain facilities, including the following:        Yes   Patient/family informed of bed offers received.  Patient [facility] bed at [facility]     Physician recommends and patient chooses bed at      Patient to be transferred to [facility] on 05/24/17.  Patient to be transferred to facility by [facility]     Patient family notified on 05/24/17 of transfer.  Name of family member notified:    patient responsible for self    PHYSICIAN Please prepare priority  discharge summary, including medications, Please prepare prescriptions, Please sign FL2     Additional Comment:    _______________________________________________ Tresa MoorePatricia V Fayette Gasner, LCSW 05/24/2017, 3:46 PM

## 2017-05-24 NOTE — Progress Notes (Signed)
Attempted to call report to [facility]. Was not able to get anyone to answer after 2 attempts.

## 2017-05-24 NOTE — Social Work (Addendum)
CSW called [employer] to f/u on [employer] Auth.  Ins Auth still pending.  [place] [facility] [place] [place]. CSW refaxed clinicals again today.  Firefigh[employer]surance Auth received. CSW will follow up to facilitate DC.

## 2017-05-24 NOTE — Discharge Instructions (Signed)

## 2017-05-24 NOTE — Progress Notes (Signed)
PATIENT ID: Chelsea PorteousFrankie M Benson  MRN: [id number]  DOB/AGE:  09/03/1951 / 66 y.o.  2 Days Post-Op Procedure(s) (LRB): TOTAL KNEE ARTHROPLASTY (Right)    PROGRESS NOTE Subjective: Patient is alert, oriented, no Nausea, no Vomiting, yes passing gas. Taking PO well. Denies SOB, Chest or Calf Pain. Using Incentive Spirometer, PAS in place. Ambulate 50', Patient reports pain as 3/10 .    Objective: Vital signs in last 24 hours: Vitals:   05/23/17 0959 05/23/17 1300 05/23/17 2123 05/24/17 0451  BP:  (!) 141/86 (!) 191/109 (!) 157/100  Pulse:  (!) 116 (!) 111 (!) 108  Resp:  14 18 18   Temp:  98.7 F (37.1 C) 99.5 F (37.5 C) 98 F (36.7 C)  TempSrc:  Oral Oral Oral  SpO2: 98%  97% 95%  Weight:      Height:          Intake/Output from previous day: I/O last 3 completed shifts: In: 5967.9 [P.O.:1920; I.V.:3947.9; IV Piggyback:100] Out: 2300 [Urine:2000; Blood:300]   Intake/Output this shift: No intake/output data recorded.   LABORATORY DATA:  Recent Labs  05/23/17 0426  05/23/17 1143 05/23/17 1655 05/23/17 2328  WBC 12.1*  --   --   --   --   HGB 10.7*  --   --   --   --   HCT 35.6*  --   --   --   --   PLT 305  --   --   --   --   NA 134*  --   --   --   --   K 4.6  --   --   --   --   CL 105  --   --   --   --   CO2 21*  --   --   --   --   BUN 14  --   --   --   --   CREATININE 0.67  --   --   --   --   GLUCOSE 253*  --   --   --   --   GLUCAP  --   < > 217* 285* 281*  CALCIUM 8.8*  --   --   --   --   < > = values in this interval not displayed.  Examination: Neurologically intact ABD soft Neurovascular intact Sensation intact distally Intact pulses distally Dorsiflexion/Plantar flexion intact Incision: scant drainage No cellulitis present Compartment soft}  Assessment:   2 Days Post-Op Procedure(s) (LRB): TOTAL KNEE ARTHROPLASTY (Right) ADDITIONAL DIAGNOSIS: Expected Acute Blood Loss Anemia, Diabetes and Hypertension, anxiety, depression,  asthma  Plan: PT/OT WBAT, AROM and PROM  DVT Prophylaxis:  SCDx72hrs, ASA 325 mg BID x 2 weeks DISCHARGE PLAN: Skilled Nursing Facility/Rehab, today if bed available DISCHARGE NEEDS: HHPT, Walker and 3-in-1 comode seat     Esteen Delpriore J 05/24/2017, 6:23 AM Patient ID: Chelsea PorteousFrankie M Benson, female   DOB: 09/24/1951, 66 y.o.   MRN: [id number]

## 2017-05-24 NOTE — Progress Notes (Signed)
Physical Therapy Treatment Patient Details Name: Chelsea Benson MRN: [id number] DOB: 06/19/1951 Today's Date: 05/24/2017    History of Present Illness Pt is 66 y/o female s/p elective R TKA secondary to R knee OA. PMH includes anxiety, CKD, DM, HTN, leep apnea, s/p lumbar fusion, s/p removal of great toe, and s/p bilat carpal tunnel release.     PT Comments    Pt performed increased gait with increased time.  Pt will continue to benefit from skilled rehab in a post acute setting to improve strength, decrease pain and improve functional mobility.  Therapeutic exercise deferred due to increased time with mobility.  Pt fatigued post session.      Follow Up Recommendations  SNF     Equipment Recommendations  None recommended by PT    Recommendations for Other Services       Precautions / Restrictions Precautions Precautions: Knee Precaution Booklet Issued: No Precaution Comments: Reviewed knee precautions during session Restrictions Weight Bearing Restrictions: Yes RLE Weight Bearing: Weight bearing as tolerated    Mobility  Bed Mobility Overal bed mobility: Needs Assistance Bed Mobility: Supine to Sit;Sit to Supine     Supine to sit: Max assist Sit to supine: Min assist   General bed mobility comments: Pt remains to require assist of RLE into and out of bed secondary to pain.  Increased assist to elevate trunk into sitting due to pain.    Transfers Overall transfer level: Needs assistance Equipment used: Rolling walker (2 wheeled) Transfers: Sit to/from Stand Sit to Stand: Min guard         General transfer comment: Cues for hand placement to and from seated surface.  Pt attempting to pull into standing with RW.    Ambulation/Gait Ambulation/Gait assistance: Min assist Ambulation Distance (Feet): 65 Feet Assistive device: Rolling walker (2 wheeled) Gait Pattern/deviations: Step-to pattern;Decreased stride length;Antalgic;Decreased weight shift to right;Trunk  flexed Gait velocity: Decreased Gait velocity interpretation: Below normal speed for age/gender General Gait Details: Pt performed increased gait but  required cues for encouragement to advance gait distance.  Pt with short standing time in RLE and required cues for gait symmetry and RW placement.     Stairs            Wheelchair Mobility    Modified Rankin (Stroke Patients Only)       Balance Overall balance assessment: Needs assistance Sitting-balance support: No upper extremity supported;Feet supported Sitting balance-Leahy Scale: Good     Standing balance support: Bilateral upper extremity supported;During functional activity Standing balance-Leahy Scale: Poor Standing balance comment: Reliant on RW or external assistance for stability.                            Cognition Arousal/Alertness: Awake/alert Behavior During Therapy: WFL for tasks assessed/performed Overall Cognitive Status: Within Functional Limits for tasks assessed                                        Exercises Total Joint Exercises Goniometric ROM: 74 degrees flexion in R knee.      General Comments        Pertinent Vitals/Pain Pain Assessment: 0-10 Pain Score: 8  Pain Location: R knee  Pain Descriptors / Indicators: Aching;Operative site guarding;Sore Pain Intervention(s): Monitored during session;Repositioned;Ice applied    Home Living  Prior Function            PT Goals (current goals can now be found in the care plan section) Acute Rehab PT Goals Patient Stated Goal: to go to rehab to get stronger and then go home  Potential to Achieve Goals: Fair Progress towards PT goals: Progressing toward goals    Frequency    7X/week      PT Plan Current plan remains appropriate    Co-evaluation              AM-PAC PT "6 Clicks" Daily Activity  Outcome Measure  Difficulty turning over in bed (including adjusting  bedclothes, sheets and blankets)?: A Lot Difficulty moving from lying on back to sitting on the side of the bed? : Total Difficulty sitting down on and standing up from a chair with arms (e.g., wheelchair, bedside commode, etc,.)?: Total Help needed moving to and from a bed to chair (including a wheelchair)?: A Lot Help needed walking in hospital room?: A Little Help needed climbing 3-5 steps with a railing? : A Lot 6 Click Score: 11    End of Session Equipment Utilized During Treatment: Gait belt Activity Tolerance: Patient limited by pain Patient left: in chair Nurse Communication: Mobility status PT Visit Diagnosis: Other abnormalities of gait and mobility (R26.89);Pain Pain - Right/Left: Right Pain - part of body: Knee     Time: 1610-9604 PT Time Calculation (min) (ACUTE ONLY): 36 min  Charges:  $Gait Training: 8-22 mins $Therapeutic Activity: 8-22 mins                    G Codes:       Joycelyn Rua, PTA pager [phone]    Florestine Avers 05/24/2017, 11:44 AM

## 2017-05-25 ENCOUNTER — Non-Acute Institutional Stay (SKILLED_NURSING_FACILITY): Payer: Medicare HMO | Admitting: Internal Medicine

## 2017-05-25 ENCOUNTER — Encounter: Payer: Self-pay | Admitting: Internal Medicine

## 2017-05-25 DIAGNOSIS — J4520 Mild intermittent asthma, uncomplicated: Secondary | ICD-10-CM | POA: Diagnosis not present

## 2017-05-25 DIAGNOSIS — Z789 Other specified health status: Secondary | ICD-10-CM | POA: Insufficient documentation

## 2017-05-25 DIAGNOSIS — E118 Type 2 diabetes mellitus with unspecified complications: Secondary | ICD-10-CM | POA: Diagnosis not present

## 2017-05-25 DIAGNOSIS — I10 Essential (primary) hypertension: Secondary | ICD-10-CM

## 2017-05-25 DIAGNOSIS — D62 Acute posthemorrhagic anemia: Secondary | ICD-10-CM | POA: Diagnosis not present

## 2017-05-25 DIAGNOSIS — Z9889 Other specified postprocedural states: Secondary | ICD-10-CM

## 2017-05-25 DIAGNOSIS — F419 Anxiety disorder, unspecified: Secondary | ICD-10-CM | POA: Diagnosis not present

## 2017-05-25 DIAGNOSIS — M1711 Unilateral primary osteoarthritis, right knee: Secondary | ICD-10-CM | POA: Diagnosis not present

## 2017-05-25 DIAGNOSIS — F39 Unspecified mood [affective] disorder: Secondary | ICD-10-CM | POA: Insufficient documentation

## 2017-05-25 NOTE — Progress Notes (Signed)
: Provider:  Randon GoldsmithAnne D. Lyn HollingsheadAlexander, MD Location:  [facility] Room Number: [phone] Place of Service:  SNF ([phone])  PCP: Sigmund HazelMiller, Lisa, MD Patient Care Team: Sigmund HazelMiller, Lisa, MD as PCP - [facility](Family Medicine)  Extended Emergency Contact Information Primary Emergency Contact: Hassan Rowanush,Kesha Address: [address]          [place], [place] [address] [place] Home Phone: [phone] Mobile Phone: [phone] Relation: Daughter Secondary Emergency Contact: Elmarie ShileyJackson,Shannon Address: [address]          [place], [place] Home Phone: [phone] Relation: Daughter     Allergies: Cozaar; Crestor [rosuvastatin calcium]; Almond meal; and Almond Therapist, occupationaloil  Chief Complaint  Patient presents with  . New Admit To SNF    Admit to Facility    HPI: Patient is 66 y.o. female with With asthma, anxiety, diabetes type 2 without complications, hypertension, hyperlipidemia, and chronic kidney disease who was admitted [facility]18[facility]20 for a planned total knee arthroplasty on the right knee. There was ABLA that did not require a transfusion. There were no other complications.Pt is admitted to SNF for OT/PT. while at SNF patient will be followed for asthma treated with albuterol nebs and MDI as well as Symbicort scheduled, hypertension treated with diltiazem and hydrochlorothiazide and anxiety treated with BuSpar.  Past Medical History:  Diagnosis Date  . Allergic rhinoconjunctivitis   . Anxiety   . Arthritis    DDD, spondylosis  . Asthma   . Chronic kidney disease    renal calculi  . Depression   . Diabetes mellitus without complication (HCC)    dx in 2007  . Headache(784.0)    sinus related   . History of kidney stones   . Hypertension   . Mental disorder   . Sleep apnea     Past Surgical History:  Procedure Laterality Date  . ABDOMINAL HYSTERECTOMY    . BACK SURGERY     2012- lumbar fusion  . BREAST SURGERY     L cyst removed - 1972  . BUNIONECTOMY     L foot  . calf -R- cyst removed    . CARPAL TUNNEL RELEASE     both hands   . great toe     removed arthritis   . NASAL SINUS SURGERY    . SHOULDER ARTHROSCOPY     R shoulder- RCR  . TOTAL KNEE ARTHROPLASTY Right 05/22/2017   Procedure: TOTAL KNEE ARTHROPLASTY;  Surgeon: Gean Birchwoodowan, Frank, MD;  [facility];  Service: Orthopedics;  Laterality: Right;  . TRIGGER FINGER RELEASE     L thumb  . TUBAL LIGATION      Allergies as of 05/25/2017      Reactions   Cozaar Cough   Crestor [rosuvastatin Calcium] Other (See Comments)   Makes her heart beat really fast.   Almond Meal Itching, Other (See Comments)   Tongue itches   Almond Oil Itching, Other (See Comments)   Itching of tongue      Medication List       Accurate as of 05/25/17 10:27 AM. Always use your most recent med list.          albuterol (2.5 MG/3ML) 0.083% nebulizer solution Commonly known as:  PROVENTIL Take 3 mLs (2.5 mg total) by nebulization every 4 (four) hours as needed for wheezing or shortness of breath.   albuterol 108 (90 Base) MCG/ACT inhaler Commonly known as:  PROVENTIL HFA;VENTOLIN  HFA Inhale 2 puffs into the lungs every 4 (four) hours as needed. For wheezing   aspirin EC 325 MG tablet Take 1 tablet (325 mg total) by mouth 2 (two) times daily.   azelastine 0.1 % nasal spray Commonly known as:  ASTELIN Use 1 spray in each nostril twice daily   budesonide-formoterol 160-4.5 MCG/ACT inhaler Commonly known as:  SYMBICORT Inhale 2 puffs into the lungs 2 (two) times daily.   busPIRone 15 MG tablet Commonly known as:  BUSPAR Take 15 mg by mouth 3 (three) times daily.   cyclobenzaprine 10 MG tablet Commonly known as:  FLEXERIL Take 10 mg by mouth 2 (two) times daily as needed for muscle spasms.   diltiazem 120 MG 24 hr capsule Commonly known as:  CARDIZEM CD Take 120 mg by mouth daily.   EPINEPHrine 0.3 mg/0.3 mL Soaj injection Commonly known as:   EPIPEN 2-PAK Use as directed for severe allergic reaction   fexofenadine 180 MG tablet Commonly known as:  ALLEGRA Take 180 mg by mouth daily.   hydrochlorothiazide 25 MG tablet Commonly known as:  HYDRODIURIL TAKE 25 MG BY MOUTH DAILY EVERY MORNING   ipratropium 0.06 % nasal spray Commonly known as:  ATROVENT Place 2 sprays into both nostrils 4 (four) times daily.   JANUMET 50-1000 MG tablet Generic drug:  sitaGLIPtin-metformin Take 1 tablet by mouth 2 (two) times daily with a meal.   KLOR-CON M20 20 MEQ tablet Generic drug:  potassium chloride SA Take 20 mEq by mouth daily.   LYRICA 100 MG capsule Generic drug:  pregabalin Take 100 mg by mouth 3 (three) times daily.   mirtazapine 30 MG tablet Commonly known as:  REMERON Take 30 mg by mouth at bedtime.   montelukast 10 MG tablet Commonly known as:  SINGULAIR Take 10 mg by mouth at bedtime.   naproxen 500 MG tablet Commonly known as:  NAPROSYN Take 1 tablet (500 mg total) by mouth 2 (two) times daily.   Olopatadine HCl 0.2 % Soln Apply 1 drop to eye daily.   OXcarbazepine 150 MG tablet Commonly known as:  TRILEPTAL Take 150 mg by mouth 3 (three) times daily.   oxybutynin 10 MG 24 hr tablet Commonly known as:  DITROPAN-XL TAKE 10 MG BY MOUTH DAILY   oxyCODONE-acetaminophen 5-325 MG tablet Commonly known as:  ROXICET Take 1 tablet by mouth every 4 (four) hours as needed for severe pain.   pravastatin 20 MG tablet Commonly known as:  PRAVACHOL Take 20 mg by mouth every evening.   tiotropium 18 MCG inhalation capsule Commonly known as:  SPIRIVA Place 18 mcg into inhaler and inhale daily.   triamcinolone ointment 0.1 % Commonly known as:  KENALOG Apply 1 application topically 2 (two) times daily.   zolpidem 10 MG tablet Commonly known as:  AMBIEN Take 10 mg by mouth at bedtime.       No orders of the defined types were placed in this encounter.   Immunization History  Administered Date(s)  Administered  . Influenza Split 09/04/2010  . Influenza, High Dose Seasonal PF 11/11/2011  . Influenza,trivalent, recombinat, inj, PF 12/07/2012, 10/04/2013    Social History  Substance Use Topics  . Smoking status: Never Smoker  . Smokeless tobacco: Never Used  . Alcohol use Yes     Comment: rare use    Family history is   Family History  Problem Relation Age of Onset  . Diabetes Sister   . Diabetes Brother   . Hypertension  Brother   . Cancer Maternal Grandmother   . Heart failure Maternal Grandmother   . Hypertension Paternal Grandmother   . Asthma Daughter   . Cancer Daughter   . Heart failure Maternal Aunt   . Allergies Neg Hx   . Eczema Neg Hx   . Immunodeficiency Neg Hx       Review of Systems  DATA OBTAINED: from patient GENERAL:  no fevers, fatigue, appetite changes SKIN: No itching, or rash EYES: No eye pain, redness, discharge EARS: No earache, tinnitus, change in hearing NOSE: No congestion, drainage or bleeding  MOUTH/THROAT: No mouth or tooth pain, No sore throat RESPIRATORY: No cough, wheezing, SOB CARDIAC: No chest pain, palpitations, lower extremity edema  GI: No abdominal pain, No N/V/D or constipation, No heartburn or reflux  GU: No dysuria, frequency or urgency, or incontinence  MUSCULOSKELETAL: No unrelieved bone/joint pain NEUROLOGIC: No headache, dizziness or focal weakness PSYCHIATRIC: No c/o anxiety or sadness   Vitals:   05/25/17 0949  BP: 119/80  Pulse: (!) 115  Resp: 16  Temp: 99 F (37.2 C)    SpO2 Readings from Last 1 Encounters:  05/25/17 98%   Body mass index is 30.73 kg/m.     Physical Exam  GENERAL APPEARANCE: Alert, conversant,  No acute distress.  SKIN: No diaphoresis rash; incision site looks good HEAD: Normocephalic, atraumatic  EYES: Conjunctiva/lids clear. Pupils round, reactive. EOMs intact.  EARS: External exam WNL, canals clear. Hearing grossly normal.  NOSE: No deformity or discharge.  MOUTH/THROAT:  Lips w/o lesions  RESPIRATORY: Breathing is even, unlabored. Lung sounds are clear   CARDIOVASCULAR: Heart RRR no murmurs, rubs or gallops. Slt R  peripheral edema c/w post surgery GASTROINTESTINAL: Abdomen is soft, non-tender, not distended w/ normal bowel sounds. GENITOURINARY: Bladder non tender, not distended  MUSCULOSKELETAL: No abnormal joints or musculature NEUROLOGIC:  Cranial nerves 2-12 grossly intact. Moves all extremities  PSYCHIATRIC: Mood and affect appropriate to situation, no behavioral issues  Patient Active Problem List   Diagnosis Date Noted  . Anxiety 05/24/2017  . Diabetes mellitus (HCC) 05/24/2017  . Hyperlipidemia 05/24/2017  . Insomnia 05/24/2017  . Primary osteoarthritis of right knee 05/22/2017  . Osteoarthritis of right knee 05/18/2017  . Allergic rhinitis due to pollen 10/06/2014  . Arthritis 10/06/2014  . Asthma 10/06/2014  . Cubital tunnel syndrome, right 10/06/2014  . Headache 10/06/2014  . Hypertension 10/06/2014  . Depression 08/07/2014  . Postlaminectomy syndrome, lumbar region 09/06/2013  . DDD (degenerative disc disease), lumbosacral 05/11/2013  . Spinal stenosis of lumbar region 05/11/2013      Labs reviewed: Basic Metabolic Panel:    Component Value Date/Time   NA 134 (L) 05/23/2017 0426   NA 134 (A) 05/23/2017   K 4.6 05/23/2017 0426   CL 105 05/23/2017 0426   CO2 21 (L) 05/23/2017 0426   GLUCOSE 253 (H) 05/23/2017 0426   BUN 14 05/23/2017 0426   BUN 14 05/23/2017   CREATININE 0.67 05/23/2017 0426   CALCIUM 8.8 (L) 05/23/2017 0426   GFRNONAA >60 05/23/2017 0426   GFRAA >60 05/23/2017 0426     Recent Labs  05/11/17 0930 05/23/17 05/23/17 0426  NA 140 134* 134*  K 3.1*  --  4.6  CL 103  --  105  CO2 28  --  21*  GLUCOSE 194*  --  253*  BUN 12 14 14   CREATININE 0.64 0.7 0.67  CALCIUM 9.5  --  8.8*   Liver Function Tests: No results for  input(s): AST, ALT, ALKPHOS, BILITOT, PROT, ALBUMIN in the last 8760 hours. No  results for input(s): LIPASE, AMYLASE in the last 8760 hours. No results for input(s): AMMONIA in the last 8760 hours. CBC:  Recent Labs  05/11/17 0930 05/23/17 05/23/17 0426 05/24/17 05/24/17 0425  WBC 7.1 12.1 12.1* 13.1 13.1*  NEUTROABS 3.0  --   --   --   --   HGB 12.6 10.7* 10.7*  --  11.9*  HCT 40.8 36 35.6*  --  38.4  MCV 84.3  --  85.2  --  82.8  PLT 355 305 305  --  321   Lipid No results for input(s): CHOL, HDL, LDLCALC, TRIG in the last 8760 hours.  Cardiac Enzymes: No results for input(s): CKTOTAL, CKMB, CKMBINDEX, TROPONINI in the last 8760 hours. BNP: No results for input(s): BNP in the last 8760 hours. No results found for: [facility] Lab Results  Component Value Date   HGBA1C 8.2 (H) 05/22/2017   No results found for: TSH No results found for: VITAMINB12 No results found for: FOLATE No results found for: IRON, TIBC, FERRITIN  Imaging and Procedures obtained prior to SNF admission: Dg Chest 2 View  Result Date: 05/11/2017 CLINICAL DATA:  Cough. Preoperative total knee replacement. Hypertension. EXAM: CHEST  2 VIEW COMPARISON:  October 04, 2012 FINDINGS: There is scarring in each lower lung zone and inferior lingula. There is no edema or consolidation. Heart is upper normal in size with pulmonary vascularity within normal limits. There is prominence of the aortic arch with aortic tortuosity. No adenopathy. No bone lesions. IMPRESSION: Scattered areas scarring bilaterally. No edema or consolidation. Aortic arch region prominence is likely due to chronic hypertensive change. Electronically Signed   By: Bretta Bang III M.D.   On: 05/11/2017 10:05     Not all labs, radiology exams or other studies done during hospitalization come through on my EPIC note; however they are reviewed by me.    Assessment and Plan  OSTEOARTHRITIS right knee/status post right knee arthroplasty-for end-stage osteoarthritis; there were no complications SNF - patient is admitted  for OT/PT  ABLA - hemoglobin dropped from 12.6-10.7 and then back up to 11.9; no transfusion was required SNF - we will follow up CBC in 5 days  ASTHMA SNF - stable; continue Symbicort 160-4.52 puffs twice a day and when necessary albuterol in the arms and MDIs  HTN SNF -stable plan to continue diltiazem 120 mg 24-hour capsule 1 daily and hydrochlorothiazide 25 mg by mouth daily  ANXIETY SNF -appears stable; plan to continue BuSpar 15 mg by mouth 3 times a day  DM2 SNF - Not stated as uncontrolled; plan to continue Janumet 50-1000 milligrams by mouth twice a day   Time spent >35 min;> 50% of time with patient was spent reviewing records, labs, tests and studies, counseling and developing plan of care  Thurston Hole D. Lyn Hollingshead, MD

## 2017-05-30 ENCOUNTER — Other Ambulatory Visit: Payer: Self-pay | Admitting: *Deleted

## 2017-05-30 NOTE — Patient Outreach (Signed)
[facility]) Care Management  05/30/2017  Chelsea Benson 06-20-1951 [id number]   Met with patient at facility. She reports she is going home tomorrow.  She states she has f/u appointment with ortho next week.  She denies any issues with transportation, medication costs or management.  Patient reports she has no DME needs, states SW will be setting up home care.  Patient reports she did have issue with blood sugars elevated, she called MD office and they said it could be from surgery to continue monitoring and if remain high over next 2 weeks to call office for appointment to follow up.   RNCM reviewed [facility] care management program. Patient does not wish to sign up at this time.  RNCM explained that she may receive calls from [facility] in the future, she said calls would be fine if indicated.  RNCM left brochure and [facility] and RNCM contact information.  Plan to sign off. Royetta Crochet. Laymond Purser, RN, BSN, [place] [phone]) Business Cell  [phone]) Toll Free Office

## 2017-06-01 ENCOUNTER — Non-Acute Institutional Stay (SKILLED_NURSING_FACILITY): Payer: Medicare HMO | Admitting: Internal Medicine

## 2017-06-01 ENCOUNTER — Encounter: Payer: Self-pay | Admitting: Internal Medicine

## 2017-06-01 DIAGNOSIS — F419 Anxiety disorder, unspecified: Secondary | ICD-10-CM | POA: Diagnosis not present

## 2017-06-01 DIAGNOSIS — E118 Type 2 diabetes mellitus with unspecified complications: Secondary | ICD-10-CM | POA: Diagnosis not present

## 2017-06-01 DIAGNOSIS — I10 Essential (primary) hypertension: Secondary | ICD-10-CM

## 2017-06-01 DIAGNOSIS — M1711 Unilateral primary osteoarthritis, right knee: Secondary | ICD-10-CM

## 2017-06-01 DIAGNOSIS — Z9889 Other specified postprocedural states: Secondary | ICD-10-CM

## 2017-06-01 DIAGNOSIS — J4520 Mild intermittent asthma, uncomplicated: Secondary | ICD-10-CM | POA: Diagnosis not present

## 2017-06-01 DIAGNOSIS — D62 Acute posthemorrhagic anemia: Secondary | ICD-10-CM

## 2017-06-01 NOTE — Progress Notes (Signed)
Location:  [facility] Room Number: [phone] Place of Service:  SNF [phone])  Provider: Randon Goldsmith. Lyn Hollingshead, MD  PCP: Sigmund Hazel, MD Patient Care Team: Sigmund Hazel, MD as PCP - [facility])  Extended Emergency Contact Information Primary Emergency Contact: Hassan Rowan Address: [address]          [place], [place] [address] [place] Home Phone: [phone] Mobile Phone: [phone] Relation: Daughter Secondary Emergency Contact: Elmarie Shiley Address: [address]          [place], [place] Home Phone: [phone] Relation: Daughter  Allergies  Allergen Reactions  . Cozaar Cough  . Crestor [Rosuvastatin Calcium] Other (See Comments)    Makes her heart beat really fast.  . Almond Meal Itching and Other (See Comments)    Tongue itches  . Almond Oil Itching and Other (See Comments)    Itching of tongue    Chief Complaint  Patient presents with  . Discharge Note    discharged from SNF to home    HPI:  66 y.o. female with asthma, anxiety, diabetes type 2 without complications, hypertension, hyperlipidemia, and chronic kidney disease who was admitted to [facility] from 6/18-20 for a planned total knee arthroplasty on the right knee. There was ABLA that did not require a transfusion. There were no other complications.Pt was admitted to SNF for OT/PT and is now ready to be d/c to home.     Past Medical History:  Diagnosis Date  . Allergic rhinoconjunctivitis   . Allergy history unknown   . Anxiety   . Arthritis    DDD, spondylosis  . Asthma   . Back pain   . Chronic kidney disease    renal calculi  . Depression   . Diabetes mellitus without complication (HCC)    dx in 2007  . Headache(784.0)    sinus related   . History of kidney stones   . Hyperlipidemia   . Hypertension   . Insomnia   . Left ankle pain   . Mental disorder   . Mood disorder (HCC)   . Sleep apnea      Past Surgical History:  Procedure Laterality Date  . ABDOMINAL HYSTERECTOMY    . BACK SURGERY     2012- lumbar fusion  . BREAST SURGERY     L cyst removed - 1972  . BUNIONECTOMY     L foot  . calf -R- cyst removed    . CARPAL TUNNEL RELEASE     both hands   . great toe     removed arthritis   . NASAL SINUS SURGERY    . SHOULDER ARTHROSCOPY     R shoulder- RCR  . TOTAL KNEE ARTHROPLASTY Right 05/22/2017   Procedure: TOTAL KNEE ARTHROPLASTY;  Surgeon: Gean Birchwood, MD;  Location: [facility] OR;  Service: Orthopedics;  Laterality: Right;  . TRIGGER FINGER RELEASE     L thumb  . TUBAL LIGATION       reports that she has never smoked. She has never used smokeless tobacco. She reports that she drinks alcohol. She reports that she does not use drugs. Social History   Social History  . Marital status: Single    Spouse name: N/A  . Number of children: N/A  . Years of education: N/A   Occupational History  . Not on file.   Social History Main Topics  . Smoking status: Never Smoker  . Smokeless tobacco: Never  Used  . Alcohol use Yes     Comment: rare use  . Drug use: No  . Sexual activity: Not on file   Other Topics Concern  . Not on file   Social History Narrative  . No narrative on file    Pertinent  Health Maintenance Due  Topic Date Due  . FOOT EXAM  07/09/1962  . OPHTHALMOLOGY EXAM  07/09/1962  . URINE MICROALBUMIN  07/09/1962  . PAP SMEAR  07/09/1973  . MAMMOGRAM  07/09/2002  . COLONOSCOPY  07/09/2002  . INFLUENZA VACCINE  07/05/2017  . HEMOGLOBIN A1C  11/21/2017    Medications: Allergies as of 06/01/2017      Reactions   Cozaar Cough   Crestor [rosuvastatin Calcium] Other (See Comments)   Makes her heart beat really fast.   Almond Meal Itching, Other (See Comments)   Tongue itches   Almond Oil Itching, Other (See Comments)   Itching of tongue      Medication List       Accurate as of 06/01/17  1:11 PM. Always use your most recent med list.           albuterol (2.5 MG/3ML) 0.083% nebulizer solution Commonly known as:  PROVENTIL Take 3 mLs (2.5 mg total) by nebulization every 4 (four) hours as needed for wheezing or shortness of breath.   albuterol 108 (90 Base) MCG/ACT inhaler Commonly known as:  PROVENTIL HFA;VENTOLIN HFA Inhale 2 puffs into the lungs every 4 (four) hours as needed. For wheezing   aspirin EC 325 MG tablet Take 1 tablet (325 mg total) by mouth 2 (two) times daily.   azelastine 0.1 % nasal spray Commonly known as:  ASTELIN Use 1 spray in each nostril twice daily   budesonide-formoterol 160-4.5 MCG/ACT inhaler Commonly known as:  SYMBICORT Inhale 2 puffs into the lungs 2 (two) times daily.   busPIRone 15 MG tablet Commonly known as:  BUSPAR Take 15 mg by mouth 3 (three) times daily.   cyclobenzaprine 10 MG tablet Commonly known as:  FLEXERIL Take 10 mg by mouth 2 (two) times daily as needed for muscle spasms.   diltiazem 120 MG 24 hr capsule Commonly known as:  CARDIZEM CD Take 120 mg by mouth daily.   EPINEPHrine 0.3 mg/0.3 mL Soaj injection Commonly known as:  EPIPEN 2-PAK Use as directed for severe allergic reaction   fexofenadine 180 MG tablet Commonly known as:  ALLEGRA Take 180 mg by mouth daily.   hydrochlorothiazide 25 MG tablet Commonly known as:  HYDRODIURIL TAKE 25 MG BY MOUTH DAILY EVERY MORNING   JANUMET 50-1000 MG tablet Generic drug:  sitaGLIPtin-metformin Take 1 tablet by mouth 2 (two) times daily with a meal.   KLOR-CON M20 20 MEQ tablet Generic drug:  potassium chloride SA Take 20 mEq by mouth daily.   LYRICA 100 MG capsule Generic drug:  pregabalin Take 100 mg by mouth 3 (three) times daily.   mirtazapine 30 MG tablet Commonly known as:  REMERON Take 30 mg by mouth at bedtime.   montelukast 10 MG tablet Commonly known as:  SINGULAIR Take 10 mg by mouth at bedtime.   naproxen 500 MG tablet Commonly known as:  NAPROSYN Take 1 tablet (500 mg total) by mouth 2  (two) times daily.   Olopatadine HCl 0.2 % Soln Apply 1 drop to eye daily.   OXcarbazepine 150 MG tablet Commonly known as:  TRILEPTAL Take 150 mg by mouth 3 (three) times daily.   oxybutynin 10 MG  24 hr tablet Commonly known as:  DITROPAN-XL TAKE 10 MG BY MOUTH DAILY   oxyCODONE-acetaminophen 5-325 MG tablet Commonly known as:  ROXICET Take 1 tablet by mouth every 4 (four) hours as needed for severe pain.   pravastatin 20 MG tablet Commonly known as:  PRAVACHOL Take 20 mg by mouth every evening.   tiotropium 18 MCG inhalation capsule Commonly known as:  SPIRIVA Place 18 mcg into inhaler and inhale daily.   zolpidem 10 MG tablet Commonly known as:  AMBIEN Take 10 mg by mouth at bedtime.        Vitals:   06/01/17 1252  BP: (!) 178/86  Pulse: 88  Resp: 20  Weight: 185 lb (83.9 kg)  Height: 5\' 4"  (1.626 m)   Body mass index is 31.76 kg/m.  Physical Exam  GENERAL APPEARANCE: Alert, conversant. No acute distress.  HEENT: Unremarkable. RESPIRATORY: Breathing is even, unlabored. Lung sounds are clear   CARDIOVASCULAR: Heart RRR no murmurs, rubs or gallops. No peripheral edema.  GASTROINTESTINAL: Abdomen is soft, non-tender, not distended w/ normal bowel sounds.  NEUROLOGIC: Cranial nerves 2-12 grossly intact. Moves all extremities   Labs reviewed: Basic Metabolic Panel:  Recent Labs  40/98/11 0930 05/23/17 05/23/17 0426  NA 140 134* 134*  K 3.1*  --  4.6  CL 103  --  105  CO2 28  --  21*  GLUCOSE 194*  --  253*  BUN 12 14 14   CREATININE 0.64 0.7 0.67  CALCIUM 9.5  --  8.8*   No results found for: [facility] Liver Function Tests: No results for input(s): AST, ALT, ALKPHOS, BILITOT, PROT, ALBUMIN in the last 8760 hours. No results for input(s): LIPASE, AMYLASE in the last 8760 hours. No results for input(s): AMMONIA in the last 8760 hours. CBC:  Recent Labs  05/11/17 0930 05/23/17 05/23/17 0426 05/24/17 05/24/17 0425  WBC 7.1 12.1 12.1* 13.1  13.1*  NEUTROABS 3.0  --   --   --   --   HGB 12.6 10.7* 10.7*  --  11.9*  HCT 40.8 36 35.6*  --  38.4  MCV 84.3  --  85.2  --  82.8  PLT 355 305 305  --  321   Lipid No results for input(s): CHOL, HDL, LDLCALC, TRIG in the last 8760 hours. Cardiac Enzymes: No results for input(s): CKTOTAL, CKMB, CKMBINDEX, TROPONINI in the last 8760 hours. BNP: No results for input(s): BNP in the last 8760 hours. CBG:  Recent Labs  05/23/17 2328 05/24/17 0643 05/24/17 1146  GLUCAP 281* 295* 316*    Procedures and Imaging Studies During Stay: Dg Chest 2 View  Result Date: 05/11/2017 CLINICAL DATA:  Cough. Preoperative total knee replacement. Hypertension. EXAM: CHEST  2 VIEW COMPARISON:  October 04, 2012 FINDINGS: There is scarring in each lower lung zone and inferior lingula. There is no edema or consolidation. Heart is upper normal in size with pulmonary vascularity within normal limits. There is prominence of the aortic arch with aortic tortuosity. No adenopathy. No bone lesions. IMPRESSION: Scattered areas scarring bilaterally. No edema or consolidation. Aortic arch region prominence is likely due to chronic hypertensive change. Electronically Signed   By: Bretta Bang III M.D.   On: 05/11/2017 10:05    Assessment/Plan:   No diagnosis found.   Patient is being discharged with the following home health services: OT/PT/Nursing   Patient is being discharged with the following durable medical equipment:  none  Patient has been advised to f/u with their  PCP in 1-2 weeks to bring them up to date on their rehab stay.  Social services at facility was responsible for arranging this appointment.  Pt was provided with a 30 day supply of prescriptions for medications and refills must be obtained from their PCP.  For controlled substances, a more limited supply may be provided adequate until PCP appointment only.  Medications have been reconciled.   Time spent > 30 min;> 50% of time with patient  was spent reviewing records, labs, tests and studies, counseling and developing plan of care  Randon Goldsmithnne D. Lyn HollingsheadAlexander, MD

## 2017-06-02 ENCOUNTER — Other Ambulatory Visit: Payer: Self-pay

## 2017-06-02 ENCOUNTER — Other Ambulatory Visit: Payer: Self-pay | Admitting: Allergy

## 2017-06-02 DIAGNOSIS — H1010 Acute atopic conjunctivitis, unspecified eye: Secondary | ICD-10-CM

## 2017-06-02 DIAGNOSIS — J309 Allergic rhinitis, unspecified: Principal | ICD-10-CM

## 2017-06-02 MED ORDER — OLOPATADINE HCL 0.2 % OP SOLN: 1.0000 [drp] | Freq: Every day | OPHTHALMIC | 0 refills | Status: DC

## 2017-06-02 NOTE — Telephone Encounter (Signed)
Pharmacy faxed to have a 90 day supply for olopatadine hcl 0.2% eye drop. A prescription was sent in for 90 supply to the [facility] on [address].

## 2017-06-03 ENCOUNTER — Encounter: Payer: Self-pay | Admitting: Internal Medicine

## 2017-06-04 ENCOUNTER — Encounter: Payer: Self-pay | Admitting: Internal Medicine

## 2017-06-04 DIAGNOSIS — F419 Anxiety disorder, unspecified: Secondary | ICD-10-CM | POA: Diagnosis not present

## 2017-06-04 DIAGNOSIS — Z96651 Presence of right artificial knee joint: Secondary | ICD-10-CM | POA: Diagnosis not present

## 2017-06-04 DIAGNOSIS — E1122 Type 2 diabetes mellitus with diabetic chronic kidney disease: Secondary | ICD-10-CM | POA: Diagnosis not present

## 2017-06-04 DIAGNOSIS — F329 Major depressive disorder, single episode, unspecified: Secondary | ICD-10-CM | POA: Diagnosis not present

## 2017-06-04 DIAGNOSIS — I129 Hypertensive chronic kidney disease with stage 1 through stage 4 chronic kidney disease, or unspecified chronic kidney disease: Secondary | ICD-10-CM | POA: Diagnosis not present

## 2017-06-04 DIAGNOSIS — N189 Chronic kidney disease, unspecified: Secondary | ICD-10-CM | POA: Diagnosis not present

## 2017-06-04 DIAGNOSIS — M479 Spondylosis, unspecified: Secondary | ICD-10-CM | POA: Diagnosis not present

## 2017-06-04 DIAGNOSIS — Z9889 Other specified postprocedural states: Secondary | ICD-10-CM | POA: Insufficient documentation

## 2017-06-04 DIAGNOSIS — Z471 Aftercare following joint replacement surgery: Secondary | ICD-10-CM | POA: Diagnosis not present

## 2017-06-04 DIAGNOSIS — D62 Acute posthemorrhagic anemia: Secondary | ICD-10-CM | POA: Insufficient documentation

## 2017-06-04 DIAGNOSIS — J45909 Unspecified asthma, uncomplicated: Secondary | ICD-10-CM | POA: Diagnosis not present

## 2017-06-05 DIAGNOSIS — Z96651 Presence of right artificial knee joint: Secondary | ICD-10-CM | POA: Diagnosis not present

## 2017-06-05 DIAGNOSIS — F329 Major depressive disorder, single episode, unspecified: Secondary | ICD-10-CM | POA: Diagnosis not present

## 2017-06-05 DIAGNOSIS — I129 Hypertensive chronic kidney disease with stage 1 through stage 4 chronic kidney disease, or unspecified chronic kidney disease: Secondary | ICD-10-CM | POA: Diagnosis not present

## 2017-06-05 DIAGNOSIS — F419 Anxiety disorder, unspecified: Secondary | ICD-10-CM | POA: Diagnosis not present

## 2017-06-05 DIAGNOSIS — E1122 Type 2 diabetes mellitus with diabetic chronic kidney disease: Secondary | ICD-10-CM | POA: Diagnosis not present

## 2017-06-05 DIAGNOSIS — Z471 Aftercare following joint replacement surgery: Secondary | ICD-10-CM | POA: Diagnosis not present

## 2017-06-05 DIAGNOSIS — N189 Chronic kidney disease, unspecified: Secondary | ICD-10-CM | POA: Diagnosis not present

## 2017-06-05 DIAGNOSIS — J45909 Unspecified asthma, uncomplicated: Secondary | ICD-10-CM | POA: Diagnosis not present

## 2017-06-05 DIAGNOSIS — M479 Spondylosis, unspecified: Secondary | ICD-10-CM | POA: Diagnosis not present

## 2017-06-06 ENCOUNTER — Other Ambulatory Visit (HOSPITAL_COMMUNITY): Payer: Self-pay | Admitting: Orthopedic Surgery

## 2017-06-06 ENCOUNTER — Ambulatory Visit (HOSPITAL_COMMUNITY)
Admission: RE | Admit: 2017-06-06 | Discharge: 2017-06-06 | Disposition: A | Discharge status: Home / Self Care | Payer: Medicare HMO | Source: Ambulatory Visit | Attending: Vascular Surgery | Admitting: Vascular Surgery

## 2017-06-06 DIAGNOSIS — Z471 Aftercare following joint replacement surgery: Secondary | ICD-10-CM | POA: Diagnosis not present

## 2017-06-06 DIAGNOSIS — M79661 Pain in right lower leg: Secondary | ICD-10-CM | POA: Diagnosis not present

## 2017-06-06 DIAGNOSIS — M25561 Pain in right knee: Secondary | ICD-10-CM

## 2017-06-06 DIAGNOSIS — Z96651 Presence of right artificial knee joint: Secondary | ICD-10-CM | POA: Diagnosis not present

## 2017-06-09 DIAGNOSIS — N189 Chronic kidney disease, unspecified: Secondary | ICD-10-CM | POA: Diagnosis not present

## 2017-06-09 DIAGNOSIS — J45909 Unspecified asthma, uncomplicated: Secondary | ICD-10-CM | POA: Diagnosis not present

## 2017-06-09 DIAGNOSIS — E1122 Type 2 diabetes mellitus with diabetic chronic kidney disease: Secondary | ICD-10-CM | POA: Diagnosis not present

## 2017-06-09 DIAGNOSIS — F329 Major depressive disorder, single episode, unspecified: Secondary | ICD-10-CM | POA: Diagnosis not present

## 2017-06-09 DIAGNOSIS — Z471 Aftercare following joint replacement surgery: Secondary | ICD-10-CM | POA: Diagnosis not present

## 2017-06-09 DIAGNOSIS — M479 Spondylosis, unspecified: Secondary | ICD-10-CM | POA: Diagnosis not present

## 2017-06-09 DIAGNOSIS — I129 Hypertensive chronic kidney disease with stage 1 through stage 4 chronic kidney disease, or unspecified chronic kidney disease: Secondary | ICD-10-CM | POA: Diagnosis not present

## 2017-06-09 DIAGNOSIS — F419 Anxiety disorder, unspecified: Secondary | ICD-10-CM | POA: Diagnosis not present

## 2017-06-09 DIAGNOSIS — Z96651 Presence of right artificial knee joint: Secondary | ICD-10-CM | POA: Diagnosis not present

## 2017-06-12 DIAGNOSIS — F419 Anxiety disorder, unspecified: Secondary | ICD-10-CM | POA: Diagnosis not present

## 2017-06-12 DIAGNOSIS — N189 Chronic kidney disease, unspecified: Secondary | ICD-10-CM | POA: Diagnosis not present

## 2017-06-12 DIAGNOSIS — I129 Hypertensive chronic kidney disease with stage 1 through stage 4 chronic kidney disease, or unspecified chronic kidney disease: Secondary | ICD-10-CM | POA: Diagnosis not present

## 2017-06-12 DIAGNOSIS — Z96651 Presence of right artificial knee joint: Secondary | ICD-10-CM | POA: Diagnosis not present

## 2017-06-12 DIAGNOSIS — F329 Major depressive disorder, single episode, unspecified: Secondary | ICD-10-CM | POA: Diagnosis not present

## 2017-06-12 DIAGNOSIS — Z471 Aftercare following joint replacement surgery: Secondary | ICD-10-CM | POA: Diagnosis not present

## 2017-06-12 DIAGNOSIS — M479 Spondylosis, unspecified: Secondary | ICD-10-CM | POA: Diagnosis not present

## 2017-06-12 DIAGNOSIS — E1122 Type 2 diabetes mellitus with diabetic chronic kidney disease: Secondary | ICD-10-CM | POA: Diagnosis not present

## 2017-06-12 DIAGNOSIS — J45909 Unspecified asthma, uncomplicated: Secondary | ICD-10-CM | POA: Diagnosis not present

## 2017-06-13 DIAGNOSIS — G4733 Obstructive sleep apnea (adult) (pediatric): Secondary | ICD-10-CM | POA: Diagnosis not present

## 2017-06-14 DIAGNOSIS — M25661 Stiffness of right knee, not elsewhere classified: Secondary | ICD-10-CM | POA: Diagnosis not present

## 2017-06-14 DIAGNOSIS — Z96651 Presence of right artificial knee joint: Secondary | ICD-10-CM | POA: Diagnosis not present

## 2017-06-14 DIAGNOSIS — M25561 Pain in right knee: Secondary | ICD-10-CM | POA: Diagnosis not present

## 2017-06-20 DIAGNOSIS — M5416 Radiculopathy, lumbar region: Secondary | ICD-10-CM | POA: Diagnosis not present

## 2017-06-21 DIAGNOSIS — M25661 Stiffness of right knee, not elsewhere classified: Secondary | ICD-10-CM | POA: Diagnosis not present

## 2017-06-21 DIAGNOSIS — Z96651 Presence of right artificial knee joint: Secondary | ICD-10-CM | POA: Diagnosis not present

## 2017-06-21 DIAGNOSIS — M25561 Pain in right knee: Secondary | ICD-10-CM | POA: Diagnosis not present

## 2017-06-27 DIAGNOSIS — Q762 Congenital spondylolisthesis: Secondary | ICD-10-CM | POA: Diagnosis not present

## 2017-06-27 DIAGNOSIS — N179 Acute kidney failure, unspecified: Secondary | ICD-10-CM | POA: Diagnosis not present

## 2017-06-27 DIAGNOSIS — I5023 Acute on chronic systolic (congestive) heart failure: Secondary | ICD-10-CM | POA: Diagnosis not present

## 2017-06-27 DIAGNOSIS — G4733 Obstructive sleep apnea (adult) (pediatric): Secondary | ICD-10-CM | POA: Diagnosis not present

## 2017-07-04 DIAGNOSIS — M25561 Pain in right knee: Secondary | ICD-10-CM | POA: Diagnosis not present

## 2017-07-04 DIAGNOSIS — M25661 Stiffness of right knee, not elsewhere classified: Secondary | ICD-10-CM | POA: Diagnosis not present

## 2017-07-04 DIAGNOSIS — Z471 Aftercare following joint replacement surgery: Secondary | ICD-10-CM | POA: Diagnosis not present

## 2017-07-04 DIAGNOSIS — Z96651 Presence of right artificial knee joint: Secondary | ICD-10-CM | POA: Diagnosis not present

## 2017-07-14 ENCOUNTER — Telehealth: Payer: Self-pay

## 2017-07-14 NOTE — Telephone Encounter (Signed)
Patient called and stated that she woke up this morning with a lot of sinus pressure. She stated that she is taking all of her medication as prescribed. She asked if there something she can take over the counter. I informed her that she can take some Mucinex to see if that will give her some relief. I also informed her that if she is not feeling better that she can all the on call doctor. She understood.

## 2017-07-31 ENCOUNTER — Ambulatory Visit (HOSPITAL_COMMUNITY)
Admission: EM | Admit: 2017-07-31 | Discharge: 2017-07-31 | Disposition: A | Discharge status: Home / Self Care | Payer: Medicare Other | Attending: Family Medicine | Admitting: Family Medicine

## 2017-07-31 ENCOUNTER — Encounter (HOSPITAL_COMMUNITY): Payer: Self-pay | Admitting: *Deleted

## 2017-07-31 DIAGNOSIS — R059 Cough, unspecified: Secondary | ICD-10-CM

## 2017-07-31 DIAGNOSIS — J00 Acute nasopharyngitis [common cold]: Secondary | ICD-10-CM

## 2017-07-31 DIAGNOSIS — R05 Cough: Secondary | ICD-10-CM

## 2017-07-31 DIAGNOSIS — R42 Dizziness and giddiness: Secondary | ICD-10-CM

## 2017-07-31 MED ORDER — MECLIZINE HCL 25 MG PO TABS: 25.0000 mg | ORAL_TABLET | Freq: Three times a day (TID) | ORAL | 0 refills | Status: DC | PRN

## 2017-07-31 MED ORDER — IPRATROPIUM BROMIDE 0.06 % NA SOLN: 2.0000 | Freq: Four times a day (QID) | NASAL | 0 refills | Status: DC

## 2017-07-31 MED ORDER — AZITHROMYCIN 250 MG PO TABS: 250.0000 mg | ORAL_TABLET | Freq: Every day | ORAL | 0 refills | Status: DC

## 2017-07-31 NOTE — ED Provider Notes (Signed)
[facility] CARE CENTER   [id number] 07/31/17 Arrival Time: 1810  ASSESSMENT & PLAN:  1. Cough   2. Dizziness   3. Acute nasopharyngitis     Meds ordered this encounter  Medications  . azithromycin (ZITHROMAX) 250 MG tablet    Sig: Take 1 tablet (250 mg total) by mouth daily. Take first 2 tablets together, then 1 every day until finished.    Dispense:  6 tablet    Refill:  0    Order Specific Question:   Supervising Provider    Answer:   Eustace Moore [[id number]]  . ipratropium (ATROVENT) 0.06 % nasal spray    Sig: Place 2 sprays into both nostrils 4 (four) times daily.    Dispense:  15 mL    Refill:  0    Order Specific Question:   Supervising Provider    Answer:   Eustace Moore [[id number]]  . meclizine (ANTIVERT) 25 MG tablet    Sig: Take 1 tablet (25 mg total) by mouth 3 (three) times daily as needed for dizziness.    Dispense:  30 tablet    Refill:  0    Order Specific Question:   Supervising Provider    Answer:   Eustace Moore [[id number]]   Push po fluids, rest, tylenol and motrin otc prn as directed for fever, arthralgias, and myalgias.  Follow up prn if sx's continue or persist.  Reviewed expectations re: course of current medical issues. Questions answered. Outlined signs and symptoms indicating need for more acute intervention. Patient verbalized understanding. After Visit Summary given.   SUBJECTIVE:  Chelsea Benson is a 66 y.o. female who presents with complaint of cough, uri sx's, and dizziness for 3 days.  ROS: As per HPI.   OBJECTIVE:  Vitals:   07/31/17 1858  BP: (!) 143/89  Pulse: 89  Resp: 19  Temp: 98.2 F (36.8 C)  TempSrc: Oral  SpO2: 99%     General appearance: alert; no distress Eyes: PERRLA; EOMI; conjunctiva normal HENT: normocephalic; atraumatic; TMs normal; nasal mucosa normal; oral mucosa normal Neck: supple Lungs: clear to auscultation bilaterally Heart: regular rate and rhythm Abdomen: soft, non-tender; bowel sounds  normal; no masses or organomegaly; no guarding or rebound tenderness Back: no CVA tenderness Extremities: no cyanosis or edema; symmetrical with no gross deformities Skin: warm and dry Neurologic: normal gait; normal symmetric reflexes Psychological: alert and cooperative; normal mood and affect  Past Medical History:  Diagnosis Date  . Allergic rhinoconjunctivitis   . Allergy history unknown   . Anxiety   . Arthritis    DDD, spondylosis  . Asthma   . Back pain   . Chronic kidney disease    renal calculi  . Depression   . Diabetes mellitus without complication (HCC)    dx in 2007  . Headache(784.0)    sinus related   . History of kidney stones   . Hyperlipidemia   . Hypertension   . Insomnia   . Left ankle pain   . Mental disorder   . Mood disorder (HCC)   . Sleep apnea      has a past medical history of Allergic rhinoconjunctivitis; Allergy history unknown; Anxiety; Arthritis; Asthma; Back pain; Chronic kidney disease; Depression; Diabetes mellitus without complication (HCC); Headache(784.0); History of kidney stones; Hyperlipidemia; Hypertension; Insomnia; Left ankle pain; Mental disorder; Mood disorder (HCC); and Sleep apnea.  Results for orders placed or performed in visit on 05/25/17  CBC and differential  Result Value Ref Range  Hemoglobin 10.7 (A) 12.0 - 16.0   HCT 36 36 - 46   Platelets 305 150 - 399   WBC 12.1   Basic metabolic panel  Result Value Ref Range   Glucose 253    BUN 14 4 - 21   Creatinine 0.7 0.5 - 1.1   Sodium 134 (A) 137 - 147  CBC and differential  Result Value Ref Range   WBC 13.1     Labs Reviewed - No data to display  Imaging: No results found.  Allergies  Allergen Reactions  . Cozaar Cough  . Crestor [Rosuvastatin Calcium] Other (See Comments)    Makes her heart beat really fast.  . Almond Meal Itching and Other (See Comments)    Tongue itches  . Almond Oil Itching and Other (See Comments)    Itching of tongue    Family  History  Problem Relation Age of Onset  . Diabetes Sister   . Diabetes Brother   . Hypertension Brother   . Cancer Maternal Grandmother   . Heart failure Maternal Grandmother   . Hypertension Paternal Grandmother   . Asthma Daughter   . Cancer Daughter   . Heart failure Maternal Aunt   . Allergies Neg Hx   . Eczema Neg Hx   . Immunodeficiency Neg Hx    Past Surgical History:  Procedure Laterality Date  . ABDOMINAL HYSTERECTOMY    . BACK SURGERY     2012- lumbar fusion  . BREAST SURGERY     L cyst removed - 1972  . BUNIONECTOMY     L foot  . calf -R- cyst removed    . CARPAL TUNNEL RELEASE     both hands   . great toe     removed arthritis   . NASAL SINUS SURGERY    . SHOULDER ARTHROSCOPY     R shoulder- RCR  . 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 TRIGGER FINGER RELEASE     L thumb  . TUBAL LIGATION           Deatra Canter, [place] 07/31/17 [place]

## 2017-07-31 NOTE — ED Triage Notes (Addendum)
Patient reports she started feeling bad on Saturday: cough, fevers, and dizziness. Denies chest pain. Neuro intact. Denies nasal congestion/drainage.

## 2017-08-08 ENCOUNTER — Telehealth: Payer: Self-pay | Admitting: Allergy

## 2017-08-08 NOTE — Telephone Encounter (Signed)
I spoke with patient. She states that she is following the discharge instructions as advised. She is c/o nonproductive cough, facial sinus pressure, headaches, hoarseness. No fever, no chills, no body aches, no nasal discharge. Please advise.

## 2017-08-08 NOTE — Telephone Encounter (Signed)
I saw she was seen at an UC recently.  Did she finish the Z-pak and did note any improvements in her symptoms with this?   If she had no improvement or incomplement she may need extended course or change to different agent.

## 2017-08-08 NOTE — Telephone Encounter (Signed)
Patient is having some sinus pressure and allergies Eye drops are not working What can patient do?? Please call and advise Patient will be calling back later in the day as well

## 2017-08-09 NOTE — Telephone Encounter (Signed)
Spoke with patient again today. She advised that she did not see any improvement after the azithromycin. She is also still waking up with her eyes matted shut.

## 2017-08-10 MED ORDER — AMOXICILLIN-POT CLAVULANATE 875-125 MG PO TABS: 1.0000 | ORAL_TABLET | Freq: Two times a day (BID) | ORAL | 0 refills | Status: AC

## 2017-08-10 NOTE — Addendum Note (Signed)
Addended by: Mliss FritzBLACK, Vonya Ohalloran I on: 08/10/2017 09:34 AM   Modules accepted: Orders

## 2017-08-10 NOTE — Telephone Encounter (Signed)
Thanks.  Let's go ahead and have her complete a course of Augmentin 875 mg BID for 10 days.

## 2017-08-21 ENCOUNTER — Other Ambulatory Visit: Payer: Self-pay | Admitting: Internal Medicine

## 2017-08-26 ENCOUNTER — Other Ambulatory Visit: Payer: Self-pay | Admitting: Internal Medicine

## 2017-09-25 ENCOUNTER — Other Ambulatory Visit: Payer: Self-pay | Admitting: Allergy

## 2017-09-25 DIAGNOSIS — J4540 Moderate persistent asthma, uncomplicated: Secondary | ICD-10-CM

## 2017-09-25 MED ORDER — BUDESONIDE-FORMOTEROL FUMARATE 160-4.5 MCG/ACT IN AERO: 2.0000 | INHALATION_SPRAY | Freq: Two times a day (BID) | RESPIRATORY_TRACT | 3 refills | Status: DC

## 2017-09-25 MED ORDER — TIOTROPIUM BROMIDE MONOHYDRATE 18 MCG IN CAPS: 18.0000 ug | ORAL_CAPSULE | Freq: Every day | RESPIRATORY_TRACT | 3 refills | Status: DC

## 2017-09-25 MED ORDER — MONTELUKAST SODIUM 10 MG PO TABS: 10.0000 mg | ORAL_TABLET | Freq: Every day | ORAL | 3 refills | Status: DC

## 2017-09-25 NOTE — Telephone Encounter (Signed)
Patient has switched pharmacies Patient needs refills on SINGULAIR, SYMBICORT, AND SPIRIVA [facility] [address]

## 2017-10-19 ENCOUNTER — Ambulatory Visit: Payer: Medicare HMO | Admitting: Allergy

## 2017-10-19 ENCOUNTER — Encounter: Payer: Self-pay | Admitting: Allergy

## 2017-10-19 ENCOUNTER — Ambulatory Visit (INDEPENDENT_AMBULATORY_CARE_PROVIDER_SITE_OTHER): Payer: Medicare Other | Admitting: Allergy

## 2017-10-19 ENCOUNTER — Encounter: Payer: Self-pay | Admitting: *Deleted

## 2017-10-19 VITALS — BP 122/78 | HR 94 | Temp 98.2°F | Resp 20

## 2017-10-19 DIAGNOSIS — R059 Cough, unspecified: Secondary | ICD-10-CM | POA: Insufficient documentation

## 2017-10-19 DIAGNOSIS — L2089 Other atopic dermatitis: Secondary | ICD-10-CM

## 2017-10-19 DIAGNOSIS — J111 Influenza due to unidentified influenza virus with other respiratory manifestations: Secondary | ICD-10-CM | POA: Diagnosis not present

## 2017-10-19 DIAGNOSIS — Z91018 Allergy to other foods: Secondary | ICD-10-CM | POA: Diagnosis not present

## 2017-10-19 DIAGNOSIS — J309 Allergic rhinitis, unspecified: Secondary | ICD-10-CM

## 2017-10-19 DIAGNOSIS — H1010 Acute atopic conjunctivitis, unspecified eye: Secondary | ICD-10-CM | POA: Diagnosis not present

## 2017-10-19 DIAGNOSIS — J4540 Moderate persistent asthma, uncomplicated: Secondary | ICD-10-CM | POA: Insufficient documentation

## 2017-10-19 DIAGNOSIS — R05 Cough: Secondary | ICD-10-CM

## 2017-10-19 LAB — INFLUENZA A AND B, RT PCR

## 2017-10-19 MED ORDER — OSELTAMIVIR PHOSPHATE 75 MG PO CAPS: 75.0000 mg | ORAL_CAPSULE | Freq: Two times a day (BID) | ORAL | 0 refills | Status: DC

## 2017-10-19 NOTE — Progress Notes (Signed)
[address] [place] [id number] Dept: [phone]  FAMILY NURSE PRACTITIONER FOLLOW UP NOTE  Patient ID: Chelsea PorteousFrankie M Cadenas, female    DOB: 03/27/1951  Age: 66 y.o. MRN: [id number] Date of Office Visit: 10/19/2017  Assessment  Chief Complaint: Shortness of Breath; Nasal Congestion; Cough; Hoarse; Generalized Body Aches; and Headache  HPI Chelsea Benson presents to the clinic with flu like symptoms including headache, nonproductive cough, nasal congestion with increased postnasal drip, body aches, sweats and chills, and diarrhea lasting for 1 day.  Tomma LightningFrankie reports all her symptoms began on Monday this week.  She is in close contact with her brother who is currently sick with similar symptoms.  She describes her breathing as "labored and wheezing" and her cough as "dry, sharp, and stuck in her throat".  She is currently using Symbicort 160- 2 puffs twice a day, Singulair 10 mg daily, Spiriva 1 time a day, and has needed her albuterol 3 times this week for coughing and wheezing. She reports one bout of pneumonia in August 2017 and one trip to urgent care in September for dizziness for which she received azithromycin which did not help.  Allergic rhinitis is reported as well controlled prior to this week.  She is taking Allegra 180 daily and using Atrovent nasal spray-2 sprays 1-2 times a day.   Her eczema is reported as well controlled.  She is moisturizing routinely with Aveno products and using triamcinolone as needed.  Most of eczema is currently on her neck and chest.  Tomma LightningFrankie has not had any accidental exposures and has an epinephrine device that is currently in date.  She has not needed to use her epinephrine pen.  Drug Allergies:  Allergies  Allergen Reactions  . Cozaar Cough  . Crestor [Rosuvastatin Calcium] Other (See Comments)    Makes her heart beat really fast.  . Fish Allergy   . Almond Meal Itching and Other (See Comments)    Tongue itches  . Almond Oil Itching  and Other (See Comments)    Itching of tongue    Physical Exam: BP 122/78   Pulse 94   Temp 98.2 F (36.8 C) (Oral)   Resp 20   SpO2 95%    Physical Exam  Constitutional: She is oriented to person, place, and time. She appears well-developed and well-nourished.  HENT:  Right Ear: External ear normal.  Left Ear: External ear normal.  Mouth/Throat: Oropharynx is clear and moist.  Bilateral nares edematous and erythematous.  Ears within normal limits.  Pharynx erythematous with cobblestone appearance without exudate.  Eyes:  Conjunctive red.  No discharge noted.  Slight periorbital swelling  Neck: Normal range of motion. Neck supple.  Some eczematoid areas noted on neck.   Cardiovascular: Normal rate, regular rhythm and normal heart sounds.  S1-S2 normal.  Regular heart rate and rhythm  Pulmonary/Chest:  Lungs clear to auscultation  Abdominal: Soft. Bowel sounds are normal.  Musculoskeletal: Normal range of motion.  Neurological: She is alert and oriented to person, place, and time.  Skin: Skin is warm and dry.  Psychiatric: She has a normal mood and affect. Her behavior is normal.      Assessment and Plan: 1. Influenza   2. Cough   3. 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 Flexural atopic dermatitis   5. Allergic rhinoconjunctivitis   6. Moderate persistent asthma, uncomplicated     Meds ordered this encounter  Medications  . oseltamivir (TAMIFLU) 75 MG capsule    Sig:  Take 1 capsule (75 mg total) 2 (two) times daily by mouth.    Dispense:  10 capsule    Refill:  0      1. Influenza - We are getting a flu test to evaluate if you have influenza. We will call you later today with this result. A prescription for Tamiflu has been called into the pharmacy  2. Cough - Testing as above  3. 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 - Carry EpiPen at all times.  4. Flexural atopic dermatitis - Continue moisturizing routine with Aveno - Use triamcinolone 0.1% below the  face.   5. Allergic rhinoconjunctivitis -Continue Singulair 10 mg at bedtime -Continue Astelin nasal spray 2 sprays in each nostril twice a day -Continue Allegra 180 mg daily use -Use olopatadine eyedrop 1 drop in each as needed for watery, itchy, red eyes -Continue allergen avoidance measures  6. Moderate persistent asthma, uncomplicated -Begin prednisone. Take 40 mg for 3 days, take 20 mg for 1 day, take 10 mg for 1 day then stop -Continue Symbicort 160/2 puffs twice daily -Continue Spiriva 2 puffs daily/take midday -Albuterol 2 puffs every 4-6 hours as needed for cough, wheeze, shortness of breath or chest tightness or either albuterol 0.083% nebulizer solution every 4 hours as needed for wheezing or shortness of breath - Continue montelukast to prevent cough or wheeze   Return in about 2 months (around 12/19/2017), or if symptoms worsen or fail to improve.    Thank you for the opportunity to care for this patient.  Please do not hesitate to contact me with questions.  Thermon LeylandAnne Beata Beason, FNP [facility]   Attestation:  I performed/discussed the history and physical examination of the patient as well as management with NP Maddy Graham. I reviewed the NP's note and agree with the documented findings and plan of care with following additions/exceptions: concerned given constellation of symptoms that she may have flu and swabbed today in clinic.  Given prescription for tamiflu despite not have swab results back.  Will treat for exacerbation of asthma with steroid pack and she will continue all her routine medications.  She will perform supportive care measures.    Margo AyeShaylar Padgett, MD [facility]

## 2017-10-19 NOTE — Patient Instructions (Addendum)
1. Influenza - We are getting a flu test to evaluate if you have influenza. We will call you later today with this result.   2. Cough - Testing as above  3. 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 - Carry EpiPen at all times.  4. Flexural atopic dermatitis - Continue moisturizing routine with Aveno - Use triamcinolone 0.1% below the face.   5. Allergic rhinoconjunctivitis -Continue Singulair 10 mg at bedtime -Continue Astelin nasal spray 2 sprays in each nostril twice a day -Continue Allegra 180 mg daily use -Use olopatadine eyedrop 1 drop in each as needed for watery, itchy, red eyes -Continue allergen avoidance measures  6. Moderate persistent asthma, uncomplicated -Begin prednisone Dosepak.  Take 40 mg for 3 days, take 20 mg for 1 day, take 10 mg for 1 day then stop -Continue Symbicort 160/2 puffs twice daily -Continue Spiriva 2 puffs daily/take midday -Albuterol 2 puffs every 4-6 hours as needed for cough, wheeze, shortness of breath or chest tightness  Follow up in 2 months or sooner if needed

## 2017-10-19 NOTE — Progress Notes (Addendum)
Spoke to patient advising we would be sending in Tamiflu 75 mg take 1 capsule twice daily for 5 days. Advised that lab results were not back but Dr Delorse LekPadgett would like to send medication in due to her symptoms being consistent with the Flu. Patient verbalized understanding.

## 2017-10-20 LAB — INFLUENZA A AND B
Influenza A Ag, EIA: NEGATIVE
Influenza B Ag, EIA: NEGATIVE

## 2017-10-20 LAB — PLEASE NOTE:

## 2017-10-20 LAB — SPECIMEN STATUS REPORT

## 2017-10-23 ENCOUNTER — Other Ambulatory Visit: Payer: Self-pay | Admitting: *Deleted

## 2017-10-23 DIAGNOSIS — J4540 Moderate persistent asthma, uncomplicated: Secondary | ICD-10-CM

## 2017-10-23 MED ORDER — AZELASTINE HCL 0.1 % NA SOLN: 1.0000 | Freq: Two times a day (BID) | NASAL | 1 refills | Status: DC

## 2017-10-23 MED ORDER — BUDESONIDE-FORMOTEROL FUMARATE 160-4.5 MCG/ACT IN AERO: 2.0000 | INHALATION_SPRAY | Freq: Two times a day (BID) | RESPIRATORY_TRACT | 1 refills | Status: DC

## 2017-10-23 MED ORDER — ALBUTEROL SULFATE HFA 108 (90 BASE) MCG/ACT IN AERS: 2.0000 | INHALATION_SPRAY | RESPIRATORY_TRACT | 1 refills | Status: DC | PRN

## 2017-10-24 ENCOUNTER — Telehealth: Payer: Self-pay

## 2017-10-24 NOTE — Telephone Encounter (Signed)
SENT NOTES TO SCHEDULING 

## 2017-11-03 ENCOUNTER — Telehealth: Payer: Self-pay | Admitting: Cardiology

## 2017-11-03 NOTE — Telephone Encounter (Signed)
[facility] for upcoming appointment on 11/07/17 @ 9:40am with Dr. Herbie BaltimoreHarding. [facility]. 11/03/17 ab

## 2017-11-07 ENCOUNTER — Ambulatory Visit: Payer: Medicare Other | Admitting: Cardiology

## 2017-11-30 ENCOUNTER — Encounter: Payer: Self-pay | Admitting: *Deleted

## 2017-12-11 ENCOUNTER — Ambulatory Visit: Payer: Medicare Other | Admitting: Cardiology

## 2018-01-05 ENCOUNTER — Ambulatory Visit (INDEPENDENT_AMBULATORY_CARE_PROVIDER_SITE_OTHER): Payer: Medicare Other | Admitting: Cardiology

## 2018-01-05 ENCOUNTER — Encounter: Payer: Self-pay | Admitting: Cardiology

## 2018-01-05 VITALS — BP 128/64 | HR 89 | Ht 64.0 in | Wt 180.0 lb

## 2018-01-05 DIAGNOSIS — E782 Mixed hyperlipidemia: Secondary | ICD-10-CM | POA: Diagnosis not present

## 2018-01-05 DIAGNOSIS — Z7189 Other specified counseling: Secondary | ICD-10-CM | POA: Diagnosis not present

## 2018-01-05 DIAGNOSIS — I10 Essential (primary) hypertension: Secondary | ICD-10-CM

## 2018-01-05 NOTE — Progress Notes (Signed)
PCP: Chelsea Hazel, MD  Clinic Note: Chief Complaint  Patient presents with  . New Patient (Initial Visit)    History of bradycardia    HPI: Chelsea Benson is a 67 y.o. female who is being seen today for the evaluation of cardiac risk factors and history of bradycardia (had formally been followed by a cardiologist in [place].   She is being seen now at the request of Via, Chelsea Bee, MD.  Chelsea Benson was last seen by her primary physician back in November.. There were no inciting symptoms to recommend cardiology consultation, simply patient request.  Recent Hospitalizations: None since August when she was seen in the emergency room with cough  Studies Personally Reviewed - (if available, images/films reviewed: From Epic Chart or Care Everywhere)  MYOCARDIAL STRESS TEST EXP - Jan8 2010 : Normal study.  Normal LV function.  EF 61%.  Apical thinning but no ischemia or infarction.   Echo 06/2009: Technically difficult.  Moderate concentric LVH with normal LV function.  No regional wall motion normalities.  EF> 55%.  Normal right ventricle.  No valve lesions.  Normal filling pressures.   Interval History: Chelsea Benson presents here today so I get somewhat concerned because of her cardiac risk factors with diabetes hypertension hyperlipidemia.  She is not very active, having to walk with a walker due to chronic back pain.  She has neuropathic pain down the left leg from her back and may have to have surgery. She has had a history of bradycardia, but her heart rate is relatively well controlled now and she denies any syncope or near syncope.  She may get a little lightheaded or dizzy when she first starts going because she is somewhat deconditioned.  She denies any exertional dyspnea or chest pain.  Unfortunately, she is not able to do much in the way of exertion.  She has completed a 12-month course of Xarelto to treat a PE back in 2017 and is her symptoms of acute dyspnea or chest pain.  No  PND, orthopnea or edema.   She may have some forceful skipped beats every now and then, but otherwise no palpitations, lightheadedness, dizziness, weakness or syncope/near syncope. No TIA/amaurosis fugax symptoms. No melena, hematochezia, hematuria, or epstaxis. No claudication.  ROS: A comprehensive was performed. Review of Systems  Constitutional: Positive for malaise/fatigue (No energy).  HENT: Negative for congestion and nosebleeds.   Respiratory: Negative for cough, shortness of breath and wheezing.        Had a URI about 2 months ago.  Not now  Gastrointestinal: Negative for abdominal pain and heartburn.  Genitourinary: Negative for dysuria.  Musculoskeletal: Positive for back pain (With radicular pain down the left leg).  Neurological: Positive for dizziness (Sometimes positional) and weakness (Left leg more than right).  Psychiatric/Behavioral: Negative for memory loss.  All other systems reviewed and are negative.   I have reviewed and (if needed) personally updated the patient's problem list, medications, allergies, past medical and surgical history, social and family history.   Past Medical History:  Diagnosis Date  . Allergic rhinoconjunctivitis   . Allergy history unknown   . Anxiety   . Arthritis    DDD, spondylosis  . Asthma   . Chronic back pain    Has had several surgeries.  He uses a walker  . Chronic kidney disease    renal calculi  . Depression   . Diabetes mellitus without complication (HCC)    dx in 2007  . Headache(784.0)  sinus related   . History of kidney stones   . Hx of pulmonary embolus 2017  . Hyperlipidemia   . Hypertension   . Insomnia   . Knee pain   . Left ankle pain   . Mental disorder   . Mood disorder (HCC)   . OSA on CPAP    PSG 05/24/2016: AHI 17/hour 02 mean 76%.  HSAT 06/13/17, AHI 11-hour 02 mean 79%  . Sleep apnea     Past Surgical History:  Procedure Laterality Date  . ABDOMINAL HYSTERECTOMY    . BACK SURGERY      2012- lumbar fusion  . BREAST SURGERY     L cyst removed - 1972  . BUNIONECTOMY Left    Great toe  . calf -R- cyst removed    . CARPAL TUNNEL RELEASE     both hands   . HAMMER TOE SURGERY Left    L foot  . NASAL SINUS SURGERY    . NM MYOVIEW LTD  12/2008   Normal study.  Normal LV function.  EF 61%.  Apical thinning but no ischemia or infarction.  Marland Kitchen SHOULDER ARTHROSCOPY Left    R shoulder- RCR  . TOTAL KNEE ARTHROPLASTY Right 05/22/2017   Procedure: TOTAL KNEE ARTHROPLASTY;  Surgeon: Gean Birchwood, MD;  Location: [facility];  Service: Orthopedics;  Laterality: Right;  . TRANSTHORACIC ECHOCARDIOGRAM  06/2009    Technically difficult.  Moderate concentric LVH with normal LV function.  No regional wall motion normalities.  EF> 55%.  Normal right ventricle.  No valve lesions.  Normal filling pressures.  Marland Kitchen TRIGGER FINGER RELEASE     L thumb  . TUBAL LIGATION      Current Meds  Medication Sig  . albuterol (PROVENTIL HFA;VENTOLIN HFA) 108 (90 Base) MCG/ACT inhaler Inhale 2 puffs every 4 (four) hours as needed into the lungs. For wheezing  . albuterol (PROVENTIL) (2.5 MG/3ML) 0.083% nebulizer solution Take 3 mLs (2.5 mg total) by nebulization every 4 (four) hours as needed for wheezing or shortness of breath.  Marland Kitchen aspirin EC 325 MG tablet Take 1 tablet (325 mg total) by mouth 2 (two) times daily.  Marland Kitchen azelastine (ASTELIN) 0.1 % nasal spray Place 1 spray 2 (two) times daily into both nostrils. Use in each nostril as directed  . azithromycin (ZITHROMAX) 250 MG tablet Take 1 tablet (250 mg total) by mouth daily. Take first 2 tablets together, then 1 every day until finished.  . budesonide-formoterol (SYMBICORT) 160-4.5 MCG/ACT inhaler Inhale 2 puffs 2 (two) times daily into the lungs.  . busPIRone (BUSPAR) 15 MG tablet Take 15 mg by mouth 3 (three) times daily.   . cyclobenzaprine (FLEXERIL) 10 MG tablet Take 10 mg by mouth 2 (two) times daily as needed for muscle spasms.  Marland Kitchen diltiazem (CARDIZEM CD) 120  MG 24 hr capsule Take 120 mg by mouth daily.   Marland Kitchen EPINEPHrine (EPIPEN 2-PAK) 0.3 mg/0.3 mL IJ SOAJ injection Use as directed for severe allergic reaction  . fexofenadine (ALLEGRA) 180 MG tablet Take 180 mg by mouth daily.  . hydrochlorothiazide (HYDRODIURIL) 25 MG tablet TAKE 25 MG BY MOUTH DAILY EVERY MORNING  . ipratropium (ATROVENT) 0.06 % nasal spray Place 2 sprays into both nostrils 4 (four) times daily.  Marland Kitchen JANUMET 50-1000 MG tablet Take 1 tablet by mouth 2 (two) times daily with a meal.   . KLOR-CON M20 20 MEQ tablet Take 20 mEq by mouth daily.   Marland Kitchen LYRICA 100 MG capsule Take 100 mg  by mouth 3 (three) times daily.   . meclizine (ANTIVERT) 25 MG tablet Take 1 tablet (25 mg total) by mouth 3 (three) times daily as needed for dizziness.  . mirtazapine (REMERON) 30 MG tablet Take 30 mg by mouth at bedtime.  . montelukast (SINGULAIR) 10 MG tablet Take 1 tablet (10 mg total) by mouth at bedtime.  . Olopatadine HCl 0.2 % SOLN Apply 1 drop to eye daily.  Marland Kitchen oseltamivir (TAMIFLU) 75 MG capsule Take 1 capsule (75 mg total) 2 (two) times daily by mouth.  . OXcarbazepine (TRILEPTAL) 150 MG tablet Take 150 mg by mouth 3 (three) times daily.   Marland Kitchen oxybutynin (DITROPAN-XL) 10 MG 24 hr tablet TAKE 10 MG BY MOUTH DAILY  . oxyCODONE-acetaminophen (PERCOCET) 10-325 MG tablet Take by mouth.  . pravastatin (PRAVACHOL) 20 MG tablet Take 20 mg by mouth every evening.   . tiotropium (SPIRIVA) 18 MCG inhalation capsule Place 1 capsule (18 mcg total) into inhaler and inhale daily.  Marland Kitchen zolpidem (AMBIEN) 10 MG tablet Take 10 mg by mouth at bedtime.     Allergies  Allergen Reactions  . Cozaar Cough  . Crestor [Rosuvastatin Calcium] Other (See Comments)    Makes her heart beat really fast.  . Fish Allergy   . Almond Meal Itching and Other (See Comments)    Tongue itches  . Almond Oil Itching and Other (See Comments)    Itching of tongue    Social History   Tobacco Use  . Smoking status: Never Smoker  .  Smokeless tobacco: Never Used  Substance Use Topics  . Alcohol use: Yes    Comment: rare use  . Drug use: No   Social History   Social History Narrative   She is currently single.  Has 2 girls.  Has some college education.  Currently disabled due to chronic back pain and not employed.    family history includes Alcoholism in her father; Asthma in her daughter; Cancer in her daughter and maternal grandmother; Depression in her daughter and daughter; Diabetes in her brother, mother, and sister; Heart failure in her maternal aunt and maternal grandmother; Hypertension in her brother, daughter, and paternal grandmother; Pneumonia in her mother.  Wt Readings from Last 3 Encounters:  01/05/18 180 lb (81.6 kg)  06/01/17 185 lb (83.9 kg)  05/25/17 179 lb 0.2 oz (81.2 kg)    PHYSICAL EXAM BP 128/64   Pulse 89   Ht 5\' 4"  (1.626 m)   Wt 180 lb (81.6 kg)   BMI 30.90 kg/m  Physical Exam  Constitutional: She appears well-developed and well-nourished. No distress.  Somewhat chronically ill-appearing but nontoxic.  Well-groomed.  She uses a rolling walker  HENT:  Head: Normocephalic and atraumatic.  Mouth/Throat: No oropharyngeal exudate.  Eyes: EOM are normal. No scleral icterus.  Neck: Normal range of motion. Neck supple. No hepatojugular reflux and no JVD present. Carotid bruit is not present.  Cardiovascular: Normal rate, regular rhythm and normal pulses.  No extrasystoles are present. PMI is not displaced. Exam reveals no gallop and no friction rub.  Murmur heard.  Medium-pitched harsh crescendo-decrescendo early systolic murmur is present with a grade of 1/6 at the upper right sternal border. Nursing note and vitals reviewed.    Adult ECG Report  Rate: 89;  Rhythm: normal sinus rhythm and Left atrial enlargement.  Otherwise normal axis, intervals and durations;   Narrative Interpretation: Relative normal EKG.   Other studies Reviewed: Additional studies/ records that were reviewed  today  include:  Recent Labs: From November 2018 clinic note: Total cholesterol 150, triglycerides 45, HDL 53, LDL 88.  Hemoglobin A1c 7.4   ASSESSMENT / PLAN:  Homero FellersFrank he has been followed by cardiologist in the past, will need to get her prior cardiac evaluation and clinic notes.  She tells me that she is also had a stress test and echo in 2017 which we need to get the results of.  Her stress test and echo in 2010 relatively benign.  Since she has had some bradycardia issues, would avoid beta-blocker or titrating up her diltiazem --> currently heart rate is in the 80s and therefore does not appear to be bradycardic.  Her blood pressure seems relatively controlled on diltiazem and HCTZ.  She seems euvolemic without any signs of heart failure. She is on Janumet for her diabetes.  She is on pravastatin and aspirin for cardiac risk reduction.  Her goal LDL should be between 70-100, closer to 70 given her diabetes and hypertension.  For now we will not make any additional changes.  We will simply obtain her outside records in order to see if there is anything specific that was being evaluated. No active cardiac issues or symptoms.  Can follow-up annually -per patient request.  Problem List Items Addressed This Visit    Cardiac risk counseling    She clearly has risk from diabetes, hypertension and hyperlipidemia.  Does not necessarily have a first-degree relative with coronary disease.  She remains relatively active although somewhat limited by her back pain.  She had some mild fleeting chest discomfort but not anginal in nature.  She has had stress test done in 2017 as well as 2010 that was negative for ischemia.  I discussed the importance of monitoring her cardiac risks of blood pressure, lipids and glycemic control.  She seems to be relatively controlled at this point.  Glycemic control is not quite as strong his lipid and blood pressure control.        Hyperlipidemia (Chronic)   Hypertension -  Primary (Chronic)      Current medicines are reviewed at length with the patient today. (+/- concerns) none The following changes have been made:None  Patient Instructions  NO CHANGE WITH CURRENT MEDICATIONS    WILL OBTAIN RECORDS FROM PREVIOUS DOCTOR IN [place]    Your physician wants you to follow-up in 12 MONTH WITH DR Tyona Nilsen. You will receive a reminder letter in the mail two months in advance. If you don't receive a letter, please call our office to schedule the follow-up appointment.   If you need a refill on your cardiac medications before your next appointment, please call your pharmacy.    Studies Ordered:   No orders of the defined types were placed in this encounter.     Bryan Lemmaavid Elainah Rhyne, M.D., M.S. Interventional Cardiologist   Pager # [phone] Phone # [phone] [address] [place], [place] [address]   Thank you for choosing [facility]!!

## 2018-01-05 NOTE — Patient Instructions (Signed)
NO CHANGE WITH CURRENT MEDICATIONS    WILL OBTAIN RECORDS FROM PREVIOUS DOCTOR IN [place]    Your physician wants you to follow-up in 12 MONTH WITH DR HARDING. You will receive a reminder letter in the mail two months in advance. If you don't receive a letter, please call our office to schedule the follow-up appointment.   If you need a refill on your cardiac medications before your next appointment, please call your pharmacy.

## 2018-01-07 ENCOUNTER — Encounter: Payer: Self-pay | Admitting: Cardiology

## 2018-01-07 DIAGNOSIS — Z7189 Other specified counseling: Secondary | ICD-10-CM | POA: Insufficient documentation

## 2018-01-07 NOTE — Assessment & Plan Note (Signed)
She clearly has risk from diabetes, hypertension and hyperlipidemia.  Does not necessarily have a first-degree relative with coronary disease.  She remains relatively active although somewhat limited by her back pain.  She had some mild fleeting chest discomfort but not anginal in nature.  She has had stress test done in 2017 as well as 2010 that was negative for ischemia.  I discussed the importance of monitoring her cardiac risks of blood pressure, lipids and glycemic control.  She seems to be relatively controlled at this point.  Glycemic control is not quite as strong his lipid and blood pressure control.

## 2018-01-10 ENCOUNTER — Telehealth: Payer: Self-pay

## 2018-01-10 NOTE — Telephone Encounter (Signed)
Spoke to patient states she thinks she has a sinus infection has nasal congestion, with low grade fever. States she is taking all prescriptions as prescribed. Writer advised patient she can use saline nasal rinse but she would need to go see a doctor as soon as she can since she is running a fever. Patient verbalized understanding.

## 2018-01-10 NOTE — Telephone Encounter (Signed)
Patient would like to talk to Dr Judeth HornPadgetts Nurse.  Please Advise

## 2018-02-02 ENCOUNTER — Other Ambulatory Visit: Payer: Self-pay

## 2018-02-02 MED ORDER — MONTELUKAST SODIUM 10 MG PO TABS: 10.0000 mg | ORAL_TABLET | Freq: Every day | ORAL | 0 refills | Status: DC

## 2018-02-02 NOTE — Telephone Encounter (Signed)
[facility] 10 [id number]#[id number]. I am sending this in today.

## 2018-02-12 ENCOUNTER — Telehealth: Payer: Self-pay | Admitting: Allergy

## 2018-02-12 NOTE — Telephone Encounter (Signed)
I do not see any mention of hydrocortisone cream. I did see in your last OV note that she was to use triamcinolone. Please advise and thank you.

## 2018-02-12 NOTE — Telephone Encounter (Signed)
Pt called and needs to have the cream called in Hydrocortisone Cream  [facility] [place]  [phone].

## 2018-02-13 NOTE — Telephone Encounter (Signed)
Hydrocortisone cream is OTC.  If she is needing triamcinolone can refill.

## 2018-02-13 NOTE — Telephone Encounter (Signed)
Patient advised that hydrocortisone is otc. She did not need a refill on triamcinolone at this time.

## 2018-03-08 ENCOUNTER — Other Ambulatory Visit: Payer: Self-pay

## 2018-03-08 MED ORDER — MONTELUKAST SODIUM 10 MG PO TABS: 10.0000 mg | ORAL_TABLET | Freq: Every day | ORAL | 0 refills | Status: DC

## 2018-03-08 NOTE — Telephone Encounter (Signed)
Spoke to patient advised refill sent in

## 2018-03-08 NOTE — Telephone Encounter (Signed)
Patient is calling to see if she can get a courtesy refill until her appt with dr Delorse Lekpadgett on 03/28/18 for Singulair

## 2018-03-09 ENCOUNTER — Other Ambulatory Visit: Payer: Self-pay

## 2018-03-09 MED ORDER — MONTELUKAST SODIUM 10 MG PO TABS: 10.0000 mg | ORAL_TABLET | Freq: Every day | ORAL | 0 refills | Status: DC

## 2018-03-09 NOTE — Telephone Encounter (Signed)
Refill request for a 90 day supply of montelukast 10 mg. I will fill with note to make OV for further refills.

## 2018-03-28 ENCOUNTER — Encounter: Payer: Self-pay | Admitting: Allergy

## 2018-03-28 ENCOUNTER — Ambulatory Visit (INDEPENDENT_AMBULATORY_CARE_PROVIDER_SITE_OTHER): Payer: Medicare Other | Admitting: Allergy

## 2018-03-28 VITALS — BP 104/66 | HR 87 | Temp 97.4°F | Resp 16 | Ht 64.0 in | Wt 178.8 lb

## 2018-03-28 DIAGNOSIS — J309 Allergic rhinitis, unspecified: Secondary | ICD-10-CM | POA: Diagnosis not present

## 2018-03-28 DIAGNOSIS — L2089 Other atopic dermatitis: Secondary | ICD-10-CM

## 2018-03-28 DIAGNOSIS — H1010 Acute atopic conjunctivitis, unspecified eye: Secondary | ICD-10-CM | POA: Diagnosis not present

## 2018-03-28 DIAGNOSIS — J4540 Moderate persistent asthma, uncomplicated: Secondary | ICD-10-CM

## 2018-03-28 DIAGNOSIS — Z91018 Allergy to other foods: Secondary | ICD-10-CM | POA: Diagnosis not present

## 2018-03-28 MED ORDER — TRIAMCINOLONE ACETONIDE 0.1 % EX CREA: 1.0000 "application " | TOPICAL_CREAM | Freq: Two times a day (BID) | CUTANEOUS | 0 refills | Status: DC

## 2018-03-28 MED ORDER — XHANCE 93 MCG/ACT NA EXHU: 2.0000 | INHALANT_SUSPENSION | Freq: Two times a day (BID) | NASAL | 5 refills | Status: DC

## 2018-03-28 NOTE — Patient Instructions (Addendum)
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 - Carry EpiPen at all times.   Flexural atopic dermatitis - Continue moisturizing routine with Aveno - Use triamcinolone 0.1% below the face.    Moderate persistent asthma -Continue Symbicort 160/2 puffs twice daily -Continue Spiriva 2 puffs daily/take midday -Albuterol 2 puffs every 4-6 hours as needed for cough, wheeze, shortness of breath or chest tightness  Follow up in 2-3 months or sooner if needed.  Let us know if symptoms are not improving or you start to develop fevers.

## 2018-03-28 NOTE — Progress Notes (Signed)
Follow-up Note  RE: Chelsea Benson MRN: [id number] DOB: 09/17/1951 Date of Office Visit: 03/28/2018   History of present illness: Chelsea Benson is a 67 y.o. female presenting today for follow-up/sick visit.  She sees us for routine management of allergic rhinoconjunctivitis, asthma, atopic dermatitis and food allergy.  She was last seen in the office on October 19, 2017 at which time she was having asthma exacerbation with concern for influenza infection.  She was treated with steroids influenza swab was negative.  About a month ago she states she had bronchitis and was treated with another round of steroids plus amoxicillin by her PCP.   She states now over the past several weeks with the height of tree pollen she has been having ear fullness and ache, fullness and pressure around her eyes with a lot of tearing, nasal congestion and drainage leading to hoarseness and throat clearing and cough as well as frontal headache.  She denies any fevers.  She denies any known sick contacts.  She has been doing saline rinses 1-2 times a day, daily Singulair and Allegra, Astelin nasal spray 2 sprays each nostril twice a day, and olopatadine eyedrop once a day if she needs it.  Despite these medications she is still very symptomatic at this time.  She states that she is using her albuterol nebulizer about once a week.  She does continue on her Symbicort 2 puffs twice a day and Spiriva 2 puffs once a day.  She denies any nighttime awakenings at this time.  She still continues to avoid all meds and has not had any accidental ingestions or need to use her EpiPen. She continues on daily moisturization and has triamcinolone to use as needed for any eczema flares.  Review of systems: Review of Systems  Constitutional: Negative for chills, fever and malaise/fatigue.  HENT: Positive for congestion, ear pain, sinus pain and sore throat. Negative for ear discharge, hearing loss, nosebleeds and tinnitus.   Eyes:  Negative for pain, discharge and redness.  Respiratory: Positive for cough. Negative for sputum production, shortness of breath and wheezing.   Cardiovascular: Negative for chest pain.  Gastrointestinal: Negative for abdominal pain, constipation, diarrhea, nausea and vomiting.  Musculoskeletal: Negative for myalgias.  Skin: Negative for itching and rash.  Neurological: Positive for headaches. Negative for dizziness.    All other systems negative unless noted above in HPI  Past medical/social/surgical/family history have been reviewed and are unchanged unless specifically indicated below.  No changes  Medication List: Allergies as of 03/28/2018      Reactions   Cozaar Cough   Crestor [rosuvastatin Calcium] Other (See Comments)   Makes her heart beat really fast.   Carvedilol    Fish Allergy    Almond Meal Itching, Other (See Comments)   Tongue itches   Almond Oil Itching, Other (See Comments)   Itching of tongue      Medication List        Accurate as of 03/28/18  4:57 PM. Always use your most recent med list.          albuterol (2.5 MG/3ML) 0.083% nebulizer solution Commonly known as:  PROVENTIL Take 3 mLs (2.5 mg total) by nebulization every 4 (four) hours as needed for wheezing or shortness of breath.   albuterol 108 (90 Base) MCG/ACT inhaler Commonly known as:  PROVENTIL HFA;VENTOLIN HFA Inhale 2 puffs every 4 (four) hours as needed into the lungs. For wheezing   aspirin EC 325 MG tablet Take  1 tablet (325 mg total) by mouth 2 (two) times daily.   azelastine 0.1 % nasal spray Commonly known as:  ASTELIN Place 1 spray 2 (two) times daily into both nostrils. Use in each nostril as directed   budesonide-formoterol 160-4.5 MCG/ACT inhaler Commonly known as:  SYMBICORT Inhale 2 puffs 2 (two) times daily into the lungs.   busPIRone 15 MG tablet Commonly known as:  BUSPAR Take 15 mg by mouth 3 (three) times daily.   cyclobenzaprine 10 MG tablet Commonly known  as:  FLEXERIL Take 10 mg by mouth 2 (two) times daily as needed for muscle spasms.   diltiazem 120 MG 24 hr capsule Commonly known as:  CARDIZEM CD Take 120 mg by mouth daily.   EPINEPHrine 0.3 mg/0.3 mL Soaj injection Commonly known as:  EPIPEN 2-PAK Use as directed for severe allergic reaction   fexofenadine 180 MG tablet Commonly known as:  ALLEGRA Take 180 mg by mouth daily.   glimepiride 2 MG tablet Commonly known as:  AMARYL TK 1 T PO QD IN THE MORNING WITH BRE OR THE FIRST MAIN MEAL OF THE DAY   hydrochlorothiazide 25 MG tablet Commonly known as:  HYDRODIURIL TAKE 25 MG BY MOUTH DAILY EVERY MORNING   ipratropium 0.06 % nasal spray Commonly known as:  ATROVENT Place 2 sprays into both nostrils 4 (four) times daily.   JANUMET 50-1000 MG tablet Generic drug:  sitaGLIPtin-metformin Take 1 tablet by mouth 2 (two) times daily with a meal.   KLOR-CON M20 20 MEQ tablet Generic drug:  potassium chloride SA Take 20 mEq by mouth daily.   LYRICA 100 MG capsule Generic drug:  pregabalin Take 100 mg by mouth 3 (three) times daily.   mirtazapine 30 MG tablet Commonly known as:  REMERON Take 30 mg by mouth at bedtime.   montelukast 10 MG tablet Commonly known as:  SINGULAIR Take 1 tablet (10 mg total) by mouth at bedtime.   Olopatadine HCl 0.2 % Soln Apply 1 drop to eye daily.   OXcarbazepine 150 MG tablet Commonly known as:  TRILEPTAL Take 150 mg by mouth 3 (three) times daily.   oxybutynin 10 MG 24 hr tablet Commonly known as:  DITROPAN-XL TAKE 10 MG BY MOUTH DAILY   oxyCODONE-acetaminophen 10-325 MG tablet Commonly known as:  PERCOCET Take by mouth.   pravastatin 20 MG tablet Commonly known as:  PRAVACHOL Take 20 mg by mouth every evening.   tiotropium 18 MCG inhalation capsule Commonly known as:  SPIRIVA Place 1 capsule (18 mcg total) into inhaler and inhale daily.   triamcinolone cream 0.1 % Commonly known as:  KENALOG Apply 1 application topically  2 (two) times daily.   XHANCE 93 MCG/ACT Exhu Generic drug:  Fluticasone Propionate Place 2 sprays into the nose 2 (two) times daily.   zolpidem 10 MG tablet Commonly known as:  AMBIEN Take 10 mg by mouth at bedtime.       Known medication allergies: Allergies  Allergen Reactions  . Cozaar Cough  . Crestor [Rosuvastatin Calcium] Other (See Comments)    Makes her heart beat really fast.  . Carvedilol   . Fish Allergy   . Almond Meal Itching and Other (See Comments)    Tongue itches  . Almond Oil Itching and Other (See Comments)    Itching of tongue     Physical examination: Blood pressure 104/66, pulse 87, temperature (!) 97.4 F (36.3 C), temperature source Oral, resp. rate 16, height 5\' 4"  (1.626 m), weight  178 lb 12.8 oz (81.1 kg), SpO2 96 %.  General: Alert, interactive, in no acute distress. HEENT: PERRLA, TMs pearly gray, turbinates moderately edematous with clear discharge, post-pharynx non erythematous. Neck: Supple without lymphadenopathy. Lungs: Clear to auscultation without wheezing, rhonchi or rales. {no increased work of breathing. CV: Normal S1, S2 without murmurs. Abdomen: Nondistended, nontender. Skin: Warm and dry, without lesions or rashes. Extremities:  No clubbing, cyanosis or edema. Neuro:   Grossly intact.  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 - Carry EpiPen at all times.   Flexural atopic dermatitis - Continue moisturizing  routine with Aveno - Use triamcinolone 0.1% below the face.    Moderate persistent asthma -Continue Symbicort 160/2 puffs twice daily -Continue Spiriva 2 puffs daily/take midday -Albuterol 2 puffs every 4-6 hours as needed for cough, wheeze, shortness of breath or chest tightness  Follow up in 2-3 months or sooner if needed.  I do believe that her current symptoms are related to allergen exposure causing  increased nasal congestion leading to ear fullness,  sinus pressure as well as increased nasal drainage that leads to throat clearing and irritation of the vocal cords.  let us know if symptoms are not improving or you start to develop fevers.     I appreciate the opportunity to take part in Chelsea Benson's care. Please do not hesitate to contact me with questions.  Sincerely,   Margo Aye, MD Allergy/Immunology [facility]

## 2018-04-11 ENCOUNTER — Encounter: Payer: Self-pay | Admitting: Podiatry

## 2018-04-11 ENCOUNTER — Ambulatory Visit (INDEPENDENT_AMBULATORY_CARE_PROVIDER_SITE_OTHER): Payer: Medicare Other | Admitting: Podiatry

## 2018-04-11 VITALS — BP 164/110 | HR 86

## 2018-04-11 DIAGNOSIS — E1159 Type 2 diabetes mellitus with other circulatory complications: Secondary | ICD-10-CM

## 2018-04-11 DIAGNOSIS — M79675 Pain in left toe(s): Secondary | ICD-10-CM

## 2018-04-11 DIAGNOSIS — B351 Tinea unguium: Secondary | ICD-10-CM

## 2018-04-11 DIAGNOSIS — M79674 Pain in right toe(s): Secondary | ICD-10-CM

## 2018-04-11 NOTE — Progress Notes (Signed)
This patient presents to the office with chief complaint of long thick nails and diabetic feet.  This patient  says he is having no pain and discomfort in his feet.  This patient saysshe has long thick painful nails.  These nails are painful walking and wearing shoes. She has no history of infection or drainage from both feet.  This patient presents the office today for treatment of the  long nails and a foot evaluation due to history of  Diabetes. History of left foot surgery.l  General Appearance  Alert, conversant and in no acute stress.  Vascular  Dorsalis pedis  are palpable  bilaterally. Posterior tibial pulses are absent  B/L. Capillary return is diminished   bilaterally. Temperature is within normal limits  bilaterally.  Neurologic  Senn-Weinstein monofilament wire test within normal limits  bilaterally. Muscle power within normal limits bilaterally.  Nails Thick disfigured discolored nails with subungual debris  from second  to fifth toes bilaterally. No evidence of bacterial infection or drainage bilaterally.  Orthopedic  No limitations of motion of motion feet .  No crepitus or effusions noted.  No bony pathology or digital deformities noted.  Skin  normotropic skin with no porokeratosis noted bilaterally.  No signs of infections or ulcers noted.     Onychomycosis  Diabetes with no foot complications  IE  Debride nails x 8   A diabetic foot exam was performed and there is no evidence of  neurologic pathology.   RTC 3 months. Vascular findings are diminished.   Helane Gunther DPM

## 2018-04-17 ENCOUNTER — Telehealth: Payer: Self-pay | Admitting: Allergy

## 2018-04-17 DIAGNOSIS — J309 Allergic rhinitis, unspecified: Principal | ICD-10-CM

## 2018-04-17 DIAGNOSIS — H1010 Acute atopic conjunctivitis, unspecified eye: Secondary | ICD-10-CM

## 2018-04-17 MED ORDER — XHANCE 93 MCG/ACT NA EXHU: 2.0000 | INHALANT_SUSPENSION | Freq: Two times a day (BID) | NASAL | 5 refills | Status: DC

## 2018-04-17 NOTE — Telephone Encounter (Signed)
Called and gave patient the number for [facility] and informed her to call them and set up delivery.

## 2018-04-17 NOTE — Telephone Encounter (Signed)
Pt called and said that she was giving a sample of Xhance and was told we would call her. [phone].

## 2018-04-17 NOTE — Telephone Encounter (Signed)
Prescription has been resent with the needed infor regarding trial and failure of previous meds.

## 2018-04-25 ENCOUNTER — Other Ambulatory Visit: Payer: Self-pay

## 2018-04-25 DIAGNOSIS — J4540 Moderate persistent asthma, uncomplicated: Secondary | ICD-10-CM

## 2018-04-25 MED ORDER — AZELASTINE HCL 0.1 % NA SOLN: 1.0000 | Freq: Two times a day (BID) | NASAL | 1 refills | Status: DC

## 2018-04-25 MED ORDER — BUDESONIDE-FORMOTEROL FUMARATE 160-4.5 MCG/ACT IN AERO: 2.0000 | INHALATION_SPRAY | Freq: Two times a day (BID) | RESPIRATORY_TRACT | 1 refills | Status: DC

## 2018-04-25 NOTE — Telephone Encounter (Signed)
Refill request for Spiriva and azelastine. Those have been sent in as requested.

## 2018-05-01 ENCOUNTER — Telehealth: Payer: Self-pay | Admitting: Allergy

## 2018-05-01 NOTE — Telephone Encounter (Signed)
Patient called and said she had a message or a text, she wasn't sure which, but she erased it. She thinks it is about something she blows into? I asked her if it was a spacer or a nebulizer, she said not a nebulizer; she is unsure. Mentioned something about Flonase.

## 2018-05-01 NOTE — Telephone Encounter (Signed)
Patient just needed the number to [facility]. I have given this to her.

## 2018-05-02 ENCOUNTER — Telehealth: Payer: Self-pay | Admitting: Allergy

## 2018-05-02 ENCOUNTER — Telehealth: Payer: Self-pay

## 2018-05-02 MED ORDER — TIOTROPIUM BROMIDE MONOHYDRATE 18 MCG IN CAPS: 18.0000 ug | ORAL_CAPSULE | Freq: Every day | RESPIRATORY_TRACT | 3 refills | Status: DC

## 2018-05-02 NOTE — Telephone Encounter (Signed)
flonase is not covered by ins and pharmacy will not fill Is there an alternate?? Please call

## 2018-05-02 NOTE — Telephone Encounter (Signed)
Patient is calling to see if her refill was ever sent in for spiriva. Patient states the pharmacy has sent [place] a few request.  Please Advise  [facility] [address] [facility]

## 2018-05-02 NOTE — Telephone Encounter (Signed)
Prescription has been sent in as requested. Patient is aware.

## 2018-05-02 NOTE — Telephone Encounter (Signed)
Called patient. Did not receive answer and unable to leave message

## 2018-05-03 NOTE — Telephone Encounter (Signed)
Called patient again and received no answer.  

## 2018-05-04 ENCOUNTER — Other Ambulatory Visit: Payer: Self-pay

## 2018-05-04 ENCOUNTER — Telehealth: Payer: Self-pay | Admitting: Allergy

## 2018-05-04 MED ORDER — FLUTICASONE PROPIONATE 50 MCG/ACT NA SUSP: 2.0000 | Freq: Two times a day (BID) | NASAL | 5 refills | Status: DC

## 2018-05-04 NOTE — Telephone Encounter (Signed)
Pt advised.

## 2018-05-04 NOTE — Telephone Encounter (Signed)
Is she having hives?   This is not something that she has complained of at recent visits.   If she can provide a description of the rash would be helpful to determine what therapy might be helpful.   If the rash is hives she should increase her antihistamine to twice a day dosing and can add zantac twice a day to provide high-dose antihistamine regimen along with her singulair that she is taking.   If the rash is not hives she may need to go to PCP or UC for eval.

## 2018-05-04 NOTE — Telephone Encounter (Signed)
Dr. Delorse LekPadgett, please advise. Pt is taking all meds as prescribed. Thank you

## 2018-05-04 NOTE — Telephone Encounter (Signed)
Error

## 2018-05-04 NOTE — Telephone Encounter (Signed)
Xhance not covered. Changed to Fluticasone, RX sent to pharmacy

## 2018-05-04 NOTE — Telephone Encounter (Signed)
Patient called stating she was broken out all over. Sat on her front porch, went inside to take a nap, woke up and is broken out all over and itching. Not in any breathing distress.

## 2018-05-18 ENCOUNTER — Other Ambulatory Visit: Payer: Self-pay

## 2018-05-18 ENCOUNTER — Telehealth: Payer: Self-pay

## 2018-05-18 DIAGNOSIS — J4540 Moderate persistent asthma, uncomplicated: Secondary | ICD-10-CM

## 2018-05-18 MED ORDER — ALBUTEROL SULFATE (2.5 MG/3ML) 0.083% IN NEBU: 2.5000 mg | INHALATION_SOLUTION | RESPIRATORY_TRACT | 1 refills | Status: DC | PRN

## 2018-05-21 ENCOUNTER — Telehealth: Payer: Self-pay | Admitting: Allergy

## 2018-05-21 DIAGNOSIS — J4540 Moderate persistent asthma, uncomplicated: Secondary | ICD-10-CM

## 2018-05-21 MED ORDER — ALBUTEROL SULFATE (2.5 MG/3ML) 0.083% IN NEBU: 2.5000 mg | INHALATION_SOLUTION | RESPIRATORY_TRACT | 1 refills | Status: DC | PRN

## 2018-05-21 MED ORDER — NEBULIZER DEVI: 1.0000 [IU] | 1 refills | Status: DC

## 2018-05-21 NOTE — Telephone Encounter (Signed)
Order for nebulizer entered into epic. Order for nebulizer solution needs to be signed by Dr. Delorse LekPadgett then [facility]. I will leave prescription in [place] for her to sign and have faxed.

## 2018-05-21 NOTE — Telephone Encounter (Signed)
[facility] her to be able to get her Nebulizer machine and the solution. Fax # [phone].

## 2018-05-22 ENCOUNTER — Telehealth: Payer: Self-pay | Admitting: Allergy

## 2018-05-22 DIAGNOSIS — J4540 Moderate persistent asthma, uncomplicated: Secondary | ICD-10-CM

## 2018-05-22 MED ORDER — ALBUTEROL SULFATE (2.5 MG/3ML) 0.083% IN NEBU: 2.5000 mg | INHALATION_SOLUTION | RESPIRATORY_TRACT | 1 refills | Status: DC | PRN

## 2018-05-22 NOTE — Telephone Encounter (Signed)
Prescription has been sent to requested pharmacy,.

## 2018-05-22 NOTE — Telephone Encounter (Signed)
Pt called and said that the nebulizer solution need to be called into pharmacy number [phone].. [phone]

## 2018-05-22 NOTE — Telephone Encounter (Signed)
Prescription has been signed and faxed 

## 2018-05-22 NOTE — Telephone Encounter (Signed)
Thanks.  I will have someone here fax the albuterol for neb order today.

## 2018-06-01 MED ORDER — NEBULIZER DEVI: 1 refills | Status: AC

## 2018-06-01 NOTE — Telephone Encounter (Signed)
Form never received per patient and [facility]. I am resending the order for the nebulizer. I will have Dr. Dellis AnesGallagher sign.

## 2018-06-01 NOTE — Addendum Note (Signed)
Addended by: Mliss FritzBLACK, Ridhima Golberg I on: 06/01/2018 09:31 AM   Modules accepted: Orders

## 2018-06-01 NOTE — Telephone Encounter (Signed)
Medical necessity form received and resent to [facility].

## 2018-06-13 ENCOUNTER — Telehealth: Payer: Self-pay

## 2018-06-13 MED ORDER — AZELASTINE HCL 0.1 % NA SOLN: 1.0000 | Freq: Two times a day (BID) | NASAL | 0 refills | Status: DC

## 2018-06-13 MED ORDER — FLUTICASONE PROPIONATE 50 MCG/ACT NA SUSP: 2.0000 | Freq: Two times a day (BID) | NASAL | 0 refills | Status: DC

## 2018-06-13 NOTE — Telephone Encounter (Signed)
Patient called and is needing a refill on her Astelin. Patient was last seen 03/28/2018 and is to return in 3 months. Patient has an OV 06/20/2018. Rx sent in.

## 2018-06-13 NOTE — Addendum Note (Signed)
Addended by: Dub MikesHICKS, Gerado Nabers N on: 06/13/2018 04:54 PM   Modules accepted: Orders

## 2018-06-13 NOTE — Telephone Encounter (Signed)
Patient also needs Flonase. Rx sent in

## 2018-06-15 ENCOUNTER — Other Ambulatory Visit: Payer: Self-pay

## 2018-06-15 MED ORDER — FLUTICASONE PROPIONATE 50 MCG/ACT NA SUSP: 2.0000 | Freq: Two times a day (BID) | NASAL | 0 refills | Status: DC

## 2018-06-15 NOTE — Telephone Encounter (Signed)
Refill on Fluticasone sent to the pharmacy with 0 RF

## 2018-06-20 ENCOUNTER — Ambulatory Visit: Payer: Medicare Other | Admitting: Allergy

## 2018-07-06 ENCOUNTER — Telehealth: Payer: Self-pay | Admitting: Allergy

## 2018-07-06 ENCOUNTER — Other Ambulatory Visit: Payer: Self-pay

## 2018-07-06 MED ORDER — MONTELUKAST SODIUM 10 MG PO TABS: 10.0000 mg | ORAL_TABLET | Freq: Every day | ORAL | 0 refills | Status: DC

## 2018-07-06 NOTE — Telephone Encounter (Signed)
Duplicate

## 2018-07-06 NOTE — Telephone Encounter (Signed)
Refill has been sent in.  

## 2018-07-06 NOTE — Telephone Encounter (Signed)
Patient called her pharmacy to get a refill on Singulair. The patient usually has issues with her pharmacy sending us a request. She would like it sent to [facility] [address]

## 2018-07-06 NOTE — Telephone Encounter (Signed)
Pt called and said that the pharmacy said that she has been getting 90 days supply and she said that she has not and that they told her she could not get any Singulair until sept and she is out because said that she is only getting 30 days. But it looks like the last refill was [phone]. [facility] [address]. [phone].

## 2018-07-11 ENCOUNTER — Ambulatory Visit: Payer: Medicare Other | Admitting: Podiatry

## 2018-07-12 ENCOUNTER — Ambulatory Visit (INDEPENDENT_AMBULATORY_CARE_PROVIDER_SITE_OTHER): Payer: Medicare Other | Admitting: Allergy

## 2018-07-12 ENCOUNTER — Encounter: Payer: Self-pay | Admitting: Allergy

## 2018-07-12 VITALS — BP 122/74 | HR 92 | Resp 14

## 2018-07-12 DIAGNOSIS — L2089 Other atopic dermatitis: Secondary | ICD-10-CM | POA: Diagnosis not present

## 2018-07-12 DIAGNOSIS — H1010 Acute atopic conjunctivitis, unspecified eye: Secondary | ICD-10-CM

## 2018-07-12 DIAGNOSIS — J4540 Moderate persistent asthma, uncomplicated: Secondary | ICD-10-CM

## 2018-07-12 DIAGNOSIS — Z91018 Allergy to other foods: Secondary | ICD-10-CM | POA: Diagnosis not present

## 2018-07-12 DIAGNOSIS — J309 Allergic rhinitis, unspecified: Secondary | ICD-10-CM

## 2018-07-12 MED ORDER — ALBUTEROL SULFATE HFA 108 (90 BASE) MCG/ACT IN AERS: 2.0000 | INHALATION_SPRAY | RESPIRATORY_TRACT | 1 refills | Status: DC | PRN

## 2018-07-12 MED ORDER — TIOTROPIUM BROMIDE MONOHYDRATE 18 MCG IN CAPS: 18.0000 ug | ORAL_CAPSULE | Freq: Every day | RESPIRATORY_TRACT | 5 refills | Status: DC

## 2018-07-12 MED ORDER — OLOPATADINE HCL 0.2 % OP SOLN: 1.0000 [drp] | Freq: Every day | OPHTHALMIC | 5 refills | Status: DC

## 2018-07-12 MED ORDER — EPINEPHRINE 0.3 MG/0.3ML IJ SOAJ: INTRAMUSCULAR | 1 refills | Status: DC

## 2018-07-12 MED ORDER — ALBUTEROL SULFATE (2.5 MG/3ML) 0.083% IN NEBU: 2.5000 mg | INHALATION_SOLUTION | RESPIRATORY_TRACT | 1 refills | Status: DC | PRN

## 2018-07-12 NOTE — Progress Notes (Signed)
Follow-up Note  RE: Chelsea PorteousFrankie M Tissue MRN: [id number] DOB: 09/02/1951 Date of Office Visit: 07/12/2018   History of present illness: Chelsea Benson is a 67 y.o. female presenting today for follow-up of asthma, allergic rhinoconjunctivitis, atopic dermatitis and almond allergy.  She was last seen in the office on 03/28/18 by our NP Ambs.  She states she has been having issues with her eyes being itchy/red/watery since she has been out of her singulair.  She has been out for the past 2 weeks as she states she was told by her pharmacist that she could not get any more refills until September as she was dispensed a 90d supply in July.  Pt states she only received a bottle of 30 tablets which she completed.  She states normally with 90d supplies she would receive 3 bottles of 30 tablets.  She also states other medications have not been filled correctly either at her pharmacy.  She is taking allegra daily.  She is out of her olopatadine as well.  She states she has not needed to use her nasal antihistamine.   With her asthma she states she has been having more cough and wheezing since she has been out of sinuglair and also has been out of her symbicort for the past week.  She is using albuterol 3-4 times a day over.  She denies any oral steroid needs since last visit however does states she had bronchitis in Feb and a URI 2 months ago.   She continues to avoid almonds and has access to her epipen.   She has triamcinolone for as needed use for control of eczema.    Review of systems: Review of Systems  Constitutional: Negative for chills, fever and malaise/fatigue.  HENT: Positive for congestion. Negative for ear discharge, ear pain, nosebleeds and sore throat.   Eyes: Negative for pain, discharge and redness.  Respiratory: Positive for cough, shortness of breath and wheezing. Negative for sputum production.   Cardiovascular: Negative for chest pain.  Gastrointestinal: Negative for abdominal pain,  constipation, diarrhea, heartburn, nausea and vomiting.  Musculoskeletal: Negative for joint pain.  Skin: Negative for itching and rash.  Neurological: Negative for headaches.    All other systems negative unless noted above in HPI  Past medical/social/surgical/family history have been reviewed and are unchanged unless specifically indicated below.  No changes  Medication List: Allergies as of 07/12/2018      Reactions   Cozaar Cough   Crestor [rosuvastatin Calcium] Other (See Comments)   Makes her heart beat really fast.   Carvedilol    Fish Allergy    Almond Meal Itching, Other (See Comments)   Tongue itches   Almond Oil Itching, Other (See Comments)   Itching of tongue      Medication List        Accurate as of 07/12/18  5:40 PM. Always use your most recent med list.          albuterol 108 (90 Base) MCG/ACT inhaler Commonly known as:  PROVENTIL HFA;VENTOLIN HFA Inhale 2 puffs into the lungs every 4 (four) hours as needed. For wheezing   albuterol (2.5 MG/3ML) 0.083% nebulizer solution Commonly known as:  PROVENTIL Take 3 mLs (2.5 mg total) by nebulization every 4 (four) hours as needed for wheezing or shortness of breath.   aspirin EC 325 MG tablet Take 1 tablet (325 mg total) by mouth 2 (two) times daily.   azelastine 0.1 % nasal spray Commonly known as:  ASTELIN  Place 1 spray into both nostrils 2 (two) times daily.   budesonide-formoterol 160-4.5 MCG/ACT inhaler Commonly known as:  SYMBICORT Inhale 2 puffs into the lungs 2 (two) times daily.   busPIRone 15 MG tablet Commonly known as:  BUSPAR Take 15 mg by mouth 3 (three) times daily.   cyclobenzaprine 10 MG tablet Commonly known as:  FLEXERIL Take 10 mg by mouth 2 (two) times daily as needed for muscle spasms.   diltiazem 120 MG 24 hr capsule Commonly known as:  CARDIZEM CD Take 120 mg by mouth daily.   EPINEPHrine 0.3 mg/0.3 mL Soaj injection Commonly known as:  EPI-PEN Use as directed for severe  allergic reaction   fexofenadine 180 MG tablet Commonly known as:  ALLEGRA Take 180 mg by mouth daily.   glimepiride 2 MG tablet Commonly known as:  AMARYL TK 1 T PO QD IN THE MORNING WITH BRE OR THE FIRST MAIN MEAL OF THE DAY   hydrochlorothiazide 25 MG tablet Commonly known as:  HYDRODIURIL TAKE 25 MG BY MOUTH DAILY EVERY MORNING   ipratropium 0.06 % nasal spray Commonly known as:  ATROVENT Place 2 sprays into both nostrils 4 (four) times daily.   JANUMET 50-1000 MG tablet Generic drug:  sitaGLIPtin-metformin Take 1 tablet by mouth 2 (two) times daily with a meal.   KLOR-CON M20 20 MEQ tablet Generic drug:  potassium chloride SA Take 20 mEq by mouth daily.   LYRICA 100 MG capsule Generic drug:  pregabalin Take 100 mg by mouth 3 (three) times daily.   mirtazapine 30 MG tablet Commonly known as:  REMERON Take 30 mg by mouth at bedtime.   montelukast 10 MG tablet Commonly known as:  SINGULAIR Take 1 tablet (10 mg total) by mouth at bedtime.   morphine 15 MG tablet Commonly known as:  MSIR Take 15 mg by mouth every 6 (six) hours as needed for severe pain.   Nebulizer [place] Use as directed with nebulizer solution.   Olopatadine HCl 0.2 % Soln Apply 1 drop to eye daily.   OXcarbazepine 150 MG tablet Commonly known as:  TRILEPTAL Take 150 mg by mouth 3 (three) times daily.   oxybutynin 10 MG 24 hr tablet Commonly known as:  DITROPAN-XL TAKE 10 MG BY MOUTH DAILY   oxyCODONE-acetaminophen 10-325 MG tablet Commonly known as:  PERCOCET Take by mouth.   pravastatin 20 MG tablet Commonly known as:  PRAVACHOL Take 20 mg by mouth every evening.   tiotropium 18 MCG inhalation capsule Commonly known as:  SPIRIVA Place 1 capsule (18 mcg total) into inhaler and inhale daily.   triamcinolone cream 0.1 % Commonly known as:  KENALOG Apply 1 application topically 2 (two) times daily.   XHANCE 93 MCG/ACT Exhu Generic drug:  Fluticasone Propionate Place 2 sprays into  the nose 2 (two) times daily.   fluticasone 50 MCG/ACT nasal spray Commonly known as:  FLONASE Place 2 sprays into both nostrils 2 (two) times daily.   zolpidem 10 MG tablet Commonly known as:  AMBIEN Take 10 mg by mouth at bedtime.       Known medication allergies: Allergies  Allergen Reactions  . Cozaar Cough  . Crestor [Rosuvastatin Calcium] Other (See Comments)    Makes her heart beat really fast.  . Carvedilol   . Fish Allergy   . Almond Meal Itching and Other (See Comments)    Tongue itches  . Almond Oil Itching and Other (See Comments)    Itching of tongue  Physical examination: Blood pressure 122/74, pulse 92, resp. rate 14, SpO2 95 %.  General: Alert, interactive, in no acute distress. HEENT: PERRLA, TMs pearly gray, turbinates mildly edematous without discharge, post-pharynx non erythematous. Neck: Supple without lymphadenopathy. Lungs: Mildly decreased breath sounds bilaterally without wheezing, rhonchi or rales. {no increased work of breathing. CV: Normal S1, S2 without murmurs. Abdomen: Nondistended, nontender. Skin: Warm and dry, without lesions or rashes. Extremities:  No clubbing, cyanosis or edema. Neuro:   Grossly intact.  Diagnositics/Labs: Spirometry: FEV1: 1.07L 55%, FVC: 1.5L 60%.  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 - Carry EpiPen at all times.   Flexural atopic dermatitis - Continue moisturizing routine with Aveno - Use triamcinolone 0.1% below the face.    Moderate persistent asthma -Continue Symbicort 160/2 puffs twice daily -Continue Spiriva 2 puffs daily/take midday -continue  singulair as above -have access to albuterol inhaler 2 puffs every 4-6 hours as needed for cough/wheeze/shortness of breath/chest tightness.  May use 15-20 minutes prior to activity.   Monitor frequency of use.    Follow up in 3-4 months or sooner if needed.    I appreciate the opportunity to take part in Breslin's care. Please do not hesitate to contact me with questions.  Sincerely,   Margo Aye, MD Allergy/Immunology [facility]

## 2018-07-12 NOTE — Patient Instructions (Addendum)
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 - Carry EpiPen at all times.   Flexural atopic dermatitis - Continue moisturizing routine with Aveno - Use triamcinolone 0.1% below the face.    Moderate persistent asthma -Continue Symbicort 160/2 puffs twice daily -Continue Spiriva 2 puffs daily/take midday -continue singulair as above -have access to albuterol inhaler 2 puffs every 4-6 hours as needed for cough/wheeze/shortness of breath/chest tightness.  May use 15-20 minutes prior to activity.   Monitor frequency of use.     Follow up in 3-4 months or sooner if needed.

## 2018-08-03 ENCOUNTER — Ambulatory Visit (INDEPENDENT_AMBULATORY_CARE_PROVIDER_SITE_OTHER): Payer: Medicare Other | Admitting: Podiatry

## 2018-08-03 ENCOUNTER — Encounter: Payer: Self-pay | Admitting: Podiatry

## 2018-08-03 ENCOUNTER — Telehealth: Payer: Self-pay | Admitting: Allergy

## 2018-08-03 DIAGNOSIS — M79674 Pain in right toe(s): Secondary | ICD-10-CM

## 2018-08-03 DIAGNOSIS — E1159 Type 2 diabetes mellitus with other circulatory complications: Secondary | ICD-10-CM

## 2018-08-03 DIAGNOSIS — M79675 Pain in left toe(s): Secondary | ICD-10-CM

## 2018-08-03 DIAGNOSIS — B351 Tinea unguium: Secondary | ICD-10-CM | POA: Diagnosis not present

## 2018-08-03 MED ORDER — AZELASTINE HCL 0.1 % NA SOLN: 1.0000 | Freq: Two times a day (BID) | NASAL | 4 refills | Status: DC

## 2018-08-03 NOTE — Progress Notes (Signed)
Complaint:  Visit Type: Patient returns to my office for continued preventative foot care services. Complaint: Patient states" my nails have grown long and thick and become painful to walk and wear shoes" Patient has been diagnosed with DM with no foot complications. The patient presents for preventative foot care services. No changes to ROS  Podiatric Exam: Vascular: dorsalis pedis and posterior tibial pulses are palpable bilateral. Capillary return is immediate. Temperature gradient is WNL. Skin turgor WNL  Sensorium: Normal Semmes Weinstein monofilament test. Normal tactile sensation bilaterally. Nail Exam: Pt has thick disfigured discolored nails with subungual debris noted bilateral entire nail hallux through fifth toenails Ulcer Exam: There is no evidence of ulcer or pre-ulcerative changes or infection. Orthopedic Exam: Muscle tone and strength are WNL. No limitations in general ROM. No crepitus or effusions noted. Foot type and digits show no abnormalities. Bony prominences are unremarkable. Skin: No Porokeratosis. No infection or ulcers  Diagnosis:  Onychomycosis, , Pain in right toe, pain in left toes  Treatment & Plan Procedures and Treatment: Consent by patient was obtained for treatment procedures.   Debridement of mycotic and hypertrophic toenails, 1 through 5 bilateral and clearing of subungual debris. No ulceration, no infection noted. Patient requests an appointment for surgical evaluation. Return Visit-Office Procedure: Patient instructed to return to the office for a follow up visit 3 months for continued evaluation and treatment.    Abygale Karpf DPM 

## 2018-08-03 NOTE — Telephone Encounter (Signed)
Called and inform patient that we have sent in RX.

## 2018-08-03 NOTE — Telephone Encounter (Signed)
Pt needs to have astelin called into [facility] on [address]. [phone].

## 2018-08-13 ENCOUNTER — Ambulatory Visit: Payer: Medicare Other | Admitting: Podiatry

## 2018-08-18 ENCOUNTER — Other Ambulatory Visit: Payer: Self-pay

## 2018-08-24 ENCOUNTER — Telehealth: Payer: Self-pay | Admitting: Allergy

## 2018-08-24 NOTE — Telephone Encounter (Signed)
It appears that someone had already spoke with patient and she stated she would just to urgent care.

## 2018-08-24 NOTE — Telephone Encounter (Signed)
Ok thanks 

## 2018-08-24 NOTE — Telephone Encounter (Signed)
Patient is calling about having fluid in her ears and having a productive cough - yellow phlem Can something be called in for her - or does she need an appt?? She was recently seen in aug  Patient uses [facility] [address]

## 2018-08-24 NOTE — Telephone Encounter (Signed)
Called patient and she wanted just something called in for abx. Patient was not happy after I advise patient she needed to be seen. She stated she cannot come in and stated she will go to urgent care.

## 2018-08-24 NOTE — Telephone Encounter (Signed)
Dr. Padgett please advise and thank you.  °

## 2018-08-24 NOTE — Telephone Encounter (Signed)
I would need more information to determine if she warrants antibiotics.  When did symptoms start?  Any additional symptoms like fever/chills, sinus pressure/pain with the fluid in ears and productive cough?  Is she taking any OTC medications for these symptoms?  She should be on her routine medications including nasal sprays.   Thus I agree she needs to be evaluated and it is fine she go to UC or her PCP if she can not make an appt.

## 2018-09-04 ENCOUNTER — Ambulatory Visit (INDEPENDENT_AMBULATORY_CARE_PROVIDER_SITE_OTHER): Payer: Medicare Other | Admitting: Psychiatry

## 2018-09-04 DIAGNOSIS — F39 Unspecified mood [affective] disorder: Secondary | ICD-10-CM

## 2018-09-04 NOTE — Progress Notes (Signed)
      [facility] Counselor/Therapist Progress Note   Patient ID: Chelsea Benson, MRN: [id number]  Date: 09/04/2018  Timespent: 45 minutes  Treatment Type: Individual  Subjective: Patient seen today for continued depression and anxiety regarding family and personal situations discussed today.  Interventions:Strength-based, Supportive and Reframing  Mental Status Exam:   Appearance:   Neat  Behavior:  Appropriate  Motor:  Normal  Speech/Language:   Normal Rate  Affect:  Appropriate and Congruent  Mood:  anxious  Thought process:  Coherent  Thought content:    Logical  Perceptual disturbances:    Normal  Orientation:  Full (Time, Place, and Person)  Attention:  Good  Concentration:  good  Memory:  Immediate  Fund of knowledge:   Good  Insight:    Good  Judgment:   Good  Impulse Control:  good    Reported Symptoms: Depression, anxiety, frustration, anger.  Risk Assessment: Danger to Self:  No Self-injurious Behavior: No Danger to Others: No Duty to Warn:no Physical Aggression / Violence:No  Access to Firearms a concern: No  Gang Involvement:No   Diagnosis:   ICD-10-CM   1. Moderate mood disorder (HCC) F39      Plan: Plan to continue seeing patient every 2 weeks.  We will continue work on current goals.  Motivation remains good.  Mathis Fare, LCSW

## 2018-09-04 NOTE — Patient Instructions (Signed)
Patient to follow through on self-care strategies and limit setting within family and to speak with brother that lives with her regarding safety issues discussed in session.

## 2018-09-10 ENCOUNTER — Other Ambulatory Visit: Payer: Self-pay

## 2018-09-10 MED ORDER — HYDROXYZINE HCL 25 MG PO TABS: 25.0000 mg | ORAL_TABLET | Freq: Four times a day (QID) | ORAL | 2 refills | Status: DC | PRN

## 2018-09-15 ENCOUNTER — Encounter (HOSPITAL_COMMUNITY): Payer: Self-pay | Admitting: Family Medicine

## 2018-09-15 ENCOUNTER — Ambulatory Visit (HOSPITAL_COMMUNITY)
Admission: EM | Admit: 2018-09-15 | Discharge: 2018-09-15 | Disposition: A | Discharge status: Home / Self Care | Payer: Medicare Other | Attending: Family Medicine | Admitting: Family Medicine

## 2018-09-15 DIAGNOSIS — J4541 Moderate persistent asthma with (acute) exacerbation: Secondary | ICD-10-CM

## 2018-09-15 DIAGNOSIS — R0602 Shortness of breath: Secondary | ICD-10-CM

## 2018-09-15 MED ORDER — PREDNISONE 10 MG (21) PO TBPK: ORAL_TABLET | Freq: Every day | ORAL | 0 refills | Status: DC

## 2018-09-15 NOTE — ED Triage Notes (Addendum)
Pt states she has SOB and headaches and vomiting x 2 days. Pt states her right ear hurts as well

## 2018-09-15 NOTE — ED Provider Notes (Signed)
[facility] CARE CENTER   [id number] 09/15/18 Arrival Time: 1259  ASSESSMENT & PLAN:  1. Moderate persistent asthma with exacerbation   2. SOB (shortness of breath)    Nebulizer treatment: she declines.  Meds ordered this encounter  Medications  . predniSONE (STERAPRED UNI-PAK 21 TAB) 10 MG (21) TBPK tablet    Sig: Take by mouth daily. Take as directed.    Dispense:  21 tablet    Refill:  0   Has nebulizer and albuterol inhaler to use at home. No symptoms suggesting lung infection or the need for ED evaluation. O2 sats normal. Asthma precautions given. OTC symptom care as needed.  Follow-up Information    [facility] EMERGENCY DEPARTMENT.   Specialty:  Emergency Medicine Why:  If symptoms worsen. Contact information: [address] [phone] mc [place] [address] [phone]         Reviewed expectations re: course of current medical issues. Questions answered. Outlined signs and symptoms indicating need for more acute intervention. Patient verbalized understanding. After Visit Summary given.  SUBJECTIVE: History from: patient.  Chelsea Benson is a 67 y.o. female who presents with complaint of fairly persistent non-productive cough and wheezing. Triggers: question environmental allergy; outside yesterday before symptoms started. Onset abrupt, approximately 1 day ago. Describes wheezing as moderate when present. Worse when she gets too hot. Occasional SOB feeling. No nasal congestion. No CP/n/v. Fever: no. Overall normal PO intake without n/v. Sick contacts: no. Typically her asthma is well controlled. Neb at home with temporary help; last used approx 2 hours ago. OTC treatment: none reported.  Received flu shot this year: no.  Social History   Tobacco Use  Smoking Status Never Smoker  Smokeless Tobacco Never Used    ROS: As per HPI. All other systems negative.   OBJECTIVE:  Vitals:   09/15/18 1344 09/15/18  1345  BP:  (!) 144/101  Pulse:  90  Resp:  20  Temp:  97.9 F (36.6 C)  TempSrc:  Oral  SpO2:  100%  Weight: 86.2 kg     General appearance: alert HEENT: nasal congestion; clear runny nose; throat irritation secondary to post-nasal drainage Neck: supple without LAD CV: RRR without murmer Lungs: unlabored respirations, moderate bilateral expiratory wheezing; cough: mild; no significant respiratory distress; able to speak short sentences Skin: warm and dry Extremities: no cyanosis or edema Neuro: normal gait Psychological: alert and cooperative; normal mood and affect  Imaging: None needed today.  Allergies  Allergen Reactions  . Cozaar Cough  . Crestor [Rosuvastatin Calcium] Other (See Comments)    Makes her heart beat really fast.  . Ambien [Zolpidem Tartrate] Other (See Comments)    ineffective  . Carvedilol   . Cymbalta [Duloxetine Hcl] Other (See Comments)    Caused hair loss  . Fish Allergy   . Rosuvastatin Calcium     Rapid heart rate  . Almond Meal Itching and Other (See Comments)    Tongue itches  . Almond Oil Itching and Other (See Comments)    Itching of tongue Itching of tongue  . Losartan Potassium     Cough    Past Medical History:  Diagnosis Date  . Allergic rhinoconjunctivitis   . Allergy history unknown   . Anxiety   . Arthritis    DDD, spondylosis  . Asthma   . Chronic back pain    Has had several surgeries.  He uses a walker  . Chronic kidney disease    renal calculi  .  Depression   . Diabetes mellitus without complication (HCC)    dx in 2007  . Eczema   . Headache(784.0)    sinus related   . History of kidney stones   . Hx of pulmonary embolus 2017  . Hyperlipidemia   . Hypertension   . Insomnia   . Knee pain   . Left ankle pain   . Mental disorder   . Mood disorder (HCC)   . OSA on CPAP    PSG 05/24/2016: AHI 17/hour 02 mean 76%.  HSAT 06/13/17, AHI 11-hour 02 mean 79%  . Recurrent upper respiratory infection (URI)   . Sleep  apnea    Family History  Problem Relation Age of Onset  . Diabetes Sister   . Diabetes Brother   . Hypertension Brother   . Cancer Maternal Grandmother   . Heart failure Maternal Grandmother   . Hypertension Paternal Grandmother   . Asthma Daughter   . Cancer Daughter   . Depression Daughter   . Hypertension Daughter   . Heart failure Maternal Aunt   . Pneumonia Mother   . Diabetes Mother   . Alcoholism Father   . Depression Daughter   . Allergies Neg Hx   . Eczema Neg Hx   . Immunodeficiency Neg Hx    Social History   Socioeconomic History  . Marital status: Single    Spouse name: Not on file  . Number of children: 2  . Years of education: Not on file  . Highest education level: Not on file  Occupational History  . Not on file  Social Needs  . Financial resource strain: Not on file  . Food insecurity:    Worry: Not on file    Inability: Not on file  . Transportation needs:    Medical: Not on file    Non-medical: Not on file  Tobacco Use  . Smoking status: Never Smoker  . Smokeless tobacco: Never Used  Substance and Sexual Activity  . Alcohol use: Yes    Comment: rare use  . Drug use: No  . Sexual activity: Not Currently  Lifestyle  . Physical activity:    Days per week: Not on file    Minutes per session: Not on file  . Stress: Not on file  Relationships  . Social connections:    Talks on phone: Not on file    Gets together: Not on file    Attends religious service: Not on file    Active member of club or organization: Not on file    Attends meetings of clubs or organizations: Not on file    Relationship status: Not on file  . Intimate partner violence:    Fear of current or ex partner: Not on file    Emotionally abused: Not on file    Physically abused: Not on file    Forced sexual activity: Not on file  Other Topics Concern  . Not on file  Social History Narrative   She is currently single.  Has 2 girls.  Has some college education.  Currently  disabled due to chronic back pain and not employed.            Mardella Layman, MD 09/15/18 1410

## 2018-09-15 NOTE — Discharge Instructions (Addendum)
You should return to the hospital if you develop worsening shortness of breath/difficulty breathing despite treatment with your prescribed medications, fevers for greater than 2 days, worsening cough, or for any other concerns related to your breathing problems. ° °Please proceed directly to the Emergency Department if: °You are short of breath even when at rest or when doing very little physical activity. °You develop difficulty eating, drinking, or talking due to asthma symptoms. °

## 2018-09-18 ENCOUNTER — Ambulatory Visit (INDEPENDENT_AMBULATORY_CARE_PROVIDER_SITE_OTHER): Payer: Medicare Other | Admitting: Psychiatry

## 2018-09-18 DIAGNOSIS — F3181 Bipolar II disorder: Secondary | ICD-10-CM

## 2018-09-18 NOTE — Progress Notes (Deleted)
[facility] MD/PA/NP Initial Note  09/18/2018 8:17 AM Chelsea Benson  MRN:  [id number]  Chief Complaint:  HPI: *** Visit Diagnosis:    ICD-10-CM   1. Bipolar II disorder (HCC) F31.81     Past Psychiatric History:   Past Medical History:  Past Medical History:  Diagnosis Date  . Allergic rhinoconjunctivitis   . Allergy history unknown   . Anxiety   . Arthritis    DDD, spondylosis  . Asthma   . Chronic back pain    Has had several surgeries.  He uses a walker  . Chronic kidney disease    renal calculi  . Depression   . Diabetes mellitus without complication (HCC)    dx in 2007  . Eczema   . Headache(784.0)    sinus related   . History of kidney stones   . Hx of pulmonary embolus 2017  . Hyperlipidemia   . Hypertension   . Insomnia   . Knee pain   . Left ankle pain   . Mental disorder   . Mood disorder (HCC)   . OSA on CPAP    PSG 05/24/2016: AHI 17/hour 02 mean 76%.  HSAT 06/13/17, AHI 11-hour 02 mean 79%  . Recurrent upper respiratory infection (URI)   . Sleep apnea     Past Surgical History:  Procedure Laterality Date  . ABDOMINAL HYSTERECTOMY    . BACK SURGERY     2012- lumbar fusion  . BREAST SURGERY     L cyst removed - 1972  . BUNIONECTOMY Left    Great toe  . calf -R- cyst removed    . CARPAL TUNNEL RELEASE     both hands   . HAMMER TOE SURGERY Left    L foot  . NASAL SINUS SURGERY    . NM MYOVIEW LTD  12/2008   Normal study.  Normal LV function.  EF 61%.  Apical thinning but no ischemia or infarction.  Marland Kitchen SHOULDER ARTHROSCOPY Left    R shoulder- RCR  . TOTAL KNEE ARTHROPLASTY Right 05/22/2017   Procedure: TOTAL KNEE ARTHROPLASTY;  Surgeon: Gean Birchwood, MD;  Location: [facility] OR;  Service: Orthopedics;  Laterality: Right;  . TRANSTHORACIC ECHOCARDIOGRAM  06/2009    Technically difficult.  Moderate concentric LVH with normal LV function.  No regional wall motion normalities.  EF> 55%.  Normal right ventricle.  No valve lesions.  Normal filling  pressures.  Marland Kitchen TRIGGER FINGER RELEASE     L thumb  . TUBAL LIGATION      Family Psychiatric History: ***  Family History:  Family History  Problem Relation Age of Onset  . Diabetes Sister   . Diabetes Brother   . Hypertension Brother   . Cancer Maternal Grandmother   . Heart failure Maternal Grandmother   . Hypertension Paternal Grandmother   . Asthma Daughter   . Cancer Daughter   . Depression Daughter   . Hypertension Daughter   . Heart failure Maternal Aunt   . Pneumonia Mother   . Diabetes Mother   . Alcoholism Father   . Depression Daughter   . Allergies Neg Hx   . Eczema Neg Hx   . Immunodeficiency Neg Hx     Social History:  Social History   Socioeconomic History  . Marital status: Single    Spouse name: Not on file  . Number of children: 2  . Years of education: Not on file  . Highest education level: Not on file  Occupational History  .  Not on file  Social Needs  . Financial resource strain: Not on file  . Food insecurity:    Worry: Not on file    Inability: Not on file  . Transportation needs:    Medical: Not on file    Non-medical: Not on file  Tobacco Use  . Smoking status: Never Smoker  . Smokeless tobacco: Never Used  Substance and Sexual Activity  . Alcohol use: Yes    Comment: rare use  . Drug use: No  . Sexual activity: Not Currently  Lifestyle  . Physical activity:    Days per week: Not on file    Minutes per session: Not on file  . Stress: Not on file  Relationships  . Social connections:    Talks on phone: Not on file    Gets together: Not on file    Attends religious service: Not on file    Active member of club or organization: Not on file    Attends meetings of clubs or organizations: Not on file    Relationship status: Not on file  Other Topics Concern  . Not on file  Social History Narrative   She is currently single.  Has 2 girls.  Has some college education.  Currently disabled due to chronic back pain and not  employed.    Allergies:  Allergies  Allergen Reactions  . Cozaar Cough  . Crestor [Rosuvastatin Calcium] Other (See Comments)    Makes her heart beat really fast.  . Ambien [Zolpidem Tartrate] Other (See Comments)    ineffective  . Carvedilol   . Cymbalta [Duloxetine Hcl] Other (See Comments)    Caused hair loss  . Fish Allergy   . Rosuvastatin Calcium     Rapid heart rate  . Almond Meal Itching and Other (See Comments)    Tongue itches  . Almond Oil Itching and Other (See Comments)    Itching of tongue Itching of tongue  . Losartan Potassium     Cough    Metabolic Disorder Labs: Lab Results  Component Value Date   HGBA1C 8.2 (H) 05/22/2017   MPG 189 05/22/2017   MPG 180 05/11/2017   No results found for: PROLACTIN No results found for: CHOL, TRIG, HDL, CHOLHDL, VLDL, LDLCALC No results found for: TSH  Therapeutic Level Labs: No results found for: LITHIUM No results found for: VALPROATE No components found for:  CBMZ  Current Medications: Current Outpatient Medications  Medication Sig Dispense Refill  . albuterol (PROVENTIL HFA;VENTOLIN HFA) 108 (90 Base) MCG/ACT inhaler Inhale 2 puffs into the lungs every 4 (four) hours as needed. For wheezing 18 g 1  . albuterol (PROVENTIL) (2.5 MG/3ML) 0.083% nebulizer solution Take 3 mLs (2.5 mg total) by nebulization every 4 (four) hours as needed for wheezing or shortness of breath. 360 mL 1  . ARIPiprazole (ABILIFY) 2 MG tablet TK 1 T PO QD  1  . aspirin EC 325 MG tablet Take 1 tablet (325 mg total) by mouth 2 (two) times daily. (Patient not taking: Reported on 07/12/2018) 30 tablet 0  . azelastine (ASTELIN) 0.1 % nasal spray Place 1 spray into both nostrils 2 (two) times daily. 30 mL 4  . budesonide-formoterol (SYMBICORT) 160-4.5 MCG/ACT inhaler Inhale 2 puffs into the lungs 2 (two) times daily. 3 Inhaler 1  . busPIRone (BUSPAR) 15 MG tablet Take 15 mg by mouth 3 (three) times daily.     . cyclobenzaprine (FLEXERIL) 10 MG  tablet Take 10 mg by mouth 2 (  two) times daily as needed for muscle spasms.    Marland Kitchen diltiazem (CARDIZEM CD) 120 MG 24 hr capsule Take 120 mg by mouth daily.     Marland Kitchen EPINEPHrine (EPIPEN 2-PAK) 0.3 mg/0.3 mL IJ SOAJ injection Use as directed for severe allergic reaction 2 Device 1  . fexofenadine (ALLEGRA) 180 MG tablet Take 180 mg by mouth daily.    . fluticasone (FLONASE) 50 MCG/ACT nasal spray Place 2 sprays into both nostrils 2 (two) times daily. 16 g 0  . glimepiride (AMARYL) 2 MG tablet TK 1 T PO QD IN THE MORNING WITH BRE OR THE FIRST MAIN MEAL OF THE DAY  0  . hydrochlorothiazide (HYDRODIURIL) 25 MG tablet TAKE 25 MG BY MOUTH DAILY EVERY MORNING    . hydrOXYzine (ATARAX/VISTARIL) 25 MG tablet Take 1 tablet (25 mg total) by mouth every 6 (six) hours as needed. 90 tablet 2  . ipratropium (ATROVENT) 0.06 % nasal spray Place 2 sprays into both nostrils 4 (four) times daily. (Patient not taking: Reported on 07/12/2018) 15 mL 0  . JANUMET 50-1000 MG tablet Take 1 tablet by mouth 2 (two) times daily with a meal.     . KLOR-CON M20 20 MEQ tablet Take 20 mEq by mouth daily.     Marland Kitchen LYRICA 100 MG capsule Take 100 mg by mouth 3 (three) times daily.     . mirtazapine (REMERON) 30 MG tablet Take 30 mg by mouth at bedtime.    . montelukast (SINGULAIR) 10 MG tablet Take 1 tablet (10 mg total) by mouth at bedtime. 90 tablet 0  . morphine (MSIR) 15 MG tablet Take 15 mg by mouth every 6 (six) hours as needed for severe pain.    Marland Kitchen Olopatadine HCl 0.2 % SOLN Apply 1 drop to eye daily. 3 Bottle 5  . OXcarbazepine (TRILEPTAL) 150 MG tablet Take 150 mg by mouth 3 (three) times daily.     Marland Kitchen oxybutynin (DITROPAN-XL) 10 MG 24 hr tablet TAKE 10 MG BY MOUTH DAILY    . oxyCODONE-acetaminophen (PERCOCET) 10-325 MG tablet Take by mouth.    . pravastatin (PRAVACHOL) 20 MG tablet Take 20 mg by mouth every evening.     . predniSONE (STERAPRED UNI-PAK 21 TAB) 10 MG (21) TBPK tablet Take by mouth daily. Take as directed. 21 tablet 0   . Respiratory Therapy Supplies (NEBULIZER) DEVI Use as directed with nebulizer solution. 1 each 1  . tiotropium (SPIRIVA) 18 MCG inhalation capsule Place 1 capsule (18 mcg total) into inhaler and inhale daily. 30 capsule 5  . triamcinolone cream (KENALOG) 0.1 % Apply 1 application topically 2 (two) times daily. 30 g 0  . XHANCE 93 MCG/ACT EXHU Place 2 sprays into the nose 2 (two) times daily. (Patient not taking: Reported on 07/12/2018) 32 mL 5  . zolpidem (AMBIEN) 10 MG tablet Take 10 mg by mouth at bedtime.      No current facility-administered medications for this visit.    Medication Side Effects: {Medication Side Effects (Optional):21014029}  Orders placed this visit:  No orders of the defined types were placed in this encounter.   Psychiatric Specialty Exam: ROS  There were no vitals taken for this visit.There is no height or weight on file to calculate BMI.  General Appearance: {PSY:22683}  Eye Contact:  {PSY:22684}  Speech:  {PSY:22685}  Volume:  {PSY:22686}  Mood:  {PSY:22306}  Affect:  {PSY:22687}  Thought Process:  {PSY:22688}  Orientation:  {PSY:22689}  Thought Content: {PSYt:22690}   Suicidal Thoughts:  {  ZOX:09604}  Homicidal Thoughts:  {PSY:22692}  Memory:  {PSY:22315}  Judgement:  {PSY:22694}  Insight:  {PSY:22695}  Psychomotor Activity:  {PSY:22696}  Concentration:  {VWU:98119}  Recall:  {PSY:22877}  Fund of Knowledge: {PSY:22877}  Language: {PSY:22877}  Akathisia:  {PSY:22294}  AIMS (if indicated): {PSYt:10129}  Assets:  {PSY:22698}  ADL's:  {PSY:22290}  Cognition: {[id number]}  Prognosis:  {PSY:22877}   Screenings:    Treatment Plan/Recommendations: ***   Mathis Fare, LCSW
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
## 2018-10-03 ENCOUNTER — Other Ambulatory Visit: Payer: Self-pay | Admitting: Allergy

## 2018-10-03 DIAGNOSIS — J4540 Moderate persistent asthma, uncomplicated: Secondary | ICD-10-CM

## 2018-10-03 DIAGNOSIS — F458 Other somatoform disorders: Principal | ICD-10-CM

## 2018-10-03 DIAGNOSIS — F419 Anxiety disorder, unspecified: Secondary | ICD-10-CM

## 2018-10-06 ENCOUNTER — Other Ambulatory Visit: Payer: Self-pay | Admitting: Allergy

## 2018-10-18 ENCOUNTER — Ambulatory Visit (INDEPENDENT_AMBULATORY_CARE_PROVIDER_SITE_OTHER): Payer: Medicare Other | Admitting: Psychiatry

## 2018-10-18 DIAGNOSIS — F3181 Bipolar II disorder: Secondary | ICD-10-CM
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
## 2018-10-26 ENCOUNTER — Ambulatory Visit (INDEPENDENT_AMBULATORY_CARE_PROVIDER_SITE_OTHER): Payer: Medicare Other | Admitting: Podiatry

## 2018-10-26 ENCOUNTER — Encounter: Payer: Self-pay | Admitting: Podiatry

## 2018-10-26 DIAGNOSIS — M79674 Pain in right toe(s): Secondary | ICD-10-CM

## 2018-10-26 DIAGNOSIS — E1159 Type 2 diabetes mellitus with other circulatory complications: Secondary | ICD-10-CM

## 2018-10-26 DIAGNOSIS — B351 Tinea unguium: Secondary | ICD-10-CM

## 2018-10-26 DIAGNOSIS — M79675 Pain in left toe(s): Secondary | ICD-10-CM | POA: Diagnosis not present

## 2018-10-26 NOTE — Progress Notes (Signed)
Complaint:  Visit Type: Patient returns to my office for continued preventative foot care services. Complaint: Patient states" my nails have grown long and thick and become painful to walk and wear shoes" Patient has been diagnosed with DM with no foot complications. The patient presents for preventative foot care services. No changes to ROS  Podiatric Exam: Vascular: dorsalis pedis and posterior tibial pulses are palpable bilateral. Capillary return is immediate. Temperature gradient is WNL. Skin turgor WNL  Sensorium: Normal Semmes Weinstein monofilament test. Normal tactile sensation bilaterally. Nail Exam: Pt has thick disfigured discolored nails with subungual debris noted bilateral entire nail hallux through fifth toenails Ulcer Exam: There is no evidence of ulcer or pre-ulcerative changes or infection. Orthopedic Exam: Muscle tone and strength are WNL. No limitations in general ROM. No crepitus or effusions noted. Foot type and digits show no abnormalities. Bony prominences are unremarkable. Skin: No Porokeratosis. No infection or ulcers  Diagnosis:  Onychomycosis, , Pain in right toe, pain in left toes  Treatment & Plan Procedures and Treatment: Consent by patient was obtained for treatment procedures.   Debridement of mycotic and hypertrophic toenails, 1 through 5 bilateral and clearing of subungual debris. No ulceration, no infection noted. Patient requests an appointment for surgical evaluation. Return Visit-Office Procedure: Patient instructed to return to the office for a follow up visit 3 months for continued evaluation and treatment.    Helane GuntherGregory Etan Vasudevan DPM

## 2018-11-05 ENCOUNTER — Other Ambulatory Visit: Payer: Self-pay | Admitting: Allergy

## 2018-11-07 ENCOUNTER — Encounter: Payer: Self-pay | Admitting: Psychiatry

## 2018-11-07 ENCOUNTER — Ambulatory Visit (INDEPENDENT_AMBULATORY_CARE_PROVIDER_SITE_OTHER): Payer: Medicare Other | Admitting: Psychiatry

## 2018-11-07 ENCOUNTER — Ambulatory Visit: Payer: Self-pay | Admitting: Psychiatry

## 2018-11-07 VITALS — BP 147/118 | HR 93

## 2018-11-07 DIAGNOSIS — F5101 Primary insomnia: Secondary | ICD-10-CM | POA: Diagnosis not present

## 2018-11-07 DIAGNOSIS — F3181 Bipolar II disorder: Secondary | ICD-10-CM | POA: Diagnosis not present

## 2018-11-07 DIAGNOSIS — F419 Anxiety disorder, unspecified: Secondary | ICD-10-CM

## 2018-11-07 MED ORDER — ZOLPIDEM TARTRATE 10 MG PO TABS: ORAL_TABLET | ORAL | 1 refills | Status: DC

## 2018-11-07 MED ORDER — OXCARBAZEPINE 150 MG PO TABS: ORAL_TABLET | ORAL | 1 refills | Status: DC

## 2018-11-07 MED ORDER — BUSPIRONE HCL 15 MG PO TABS: 15.0000 mg | ORAL_TABLET | Freq: Three times a day (TID) | ORAL | 1 refills | Status: DC

## 2018-11-07 MED ORDER — HYDROXYZINE HCL 25 MG PO TABS: 25.0000 mg | ORAL_TABLET | Freq: Four times a day (QID) | ORAL | 1 refills | Status: DC | PRN

## 2018-11-07 MED ORDER — ARIPIPRAZOLE 2 MG PO TABS: 2.0000 mg | ORAL_TABLET | Freq: Every day | ORAL | 1 refills | Status: DC

## 2018-11-07 MED ORDER — MIRTAZAPINE 30 MG PO TABS: 30.0000 mg | ORAL_TABLET | Freq: Every day | ORAL | 1 refills | Status: DC

## 2018-11-07 NOTE — Progress Notes (Signed)
Chelsea Benson [id number] [birth year] 67 y.o.  Subjective:   Patient ID:  Chelsea Benson is a 67 y.o. (DOB 1951-03-05) female.  Chief Complaint:  Chief Complaint  Patient presents with  . Follow-up    insomnia, anxiety, depression    HPI Chelsea Benson presents to the office today for follow-up of insomnia  "I'm not sleeping." She reports that she has been awakening every night at 3 am and unable to return to sleep. Has stopped keeping her grandchild to try to get more sleep. Denies difficulty falling asleep. Sometimes falling asleep around 8 pm. Reports that her grandson with schizophrenia has started job where he does not get off work until 1-2 am and reports that she may be staying up to make sure he gets home ok because she is worried about him. Reports that sleeping difficulties started around the time grandson started job. She reports overall anxiety has been "good." Going to start school in January and has some anxiety about this. She reports that her appetite has been ok. Energy and motivation are good. Denies difficulty with concentration or focus. Denies SI.   Reports that she just found out that she has been approved for housing assistance.    Review of Systems:  Review of Systems  Musculoskeletal: Positive for gait problem.  Allergic/Immunologic: Positive for environmental allergies.  Neurological: Negative for tremors.  Psychiatric/Behavioral:       Please refer to HPI   Reports that her PCP is leaving and Dr. Sigmund Hazel will be her new PCP.  Medications: I have reviewed the patient's current medications.  Current Outpatient Medications  Medication Sig Dispense Refill  . albuterol (PROVENTIL HFA;VENTOLIN HFA) 108 (90 Base) MCG/ACT inhaler Inhale 2 puffs into the lungs every 4 (four) hours as needed. For wheezing 18 g 1  . ARIPiprazole (ABILIFY) 2 MG tablet Take 1 tablet (2 mg total) by mouth daily. 90 tablet 1  . aspirin EC 325 MG tablet Take 1 tablet (325 mg  total) by mouth 2 (two) times daily. (Patient taking differently: Take 81 mg by mouth daily. ) 30 tablet 0  . azelastine (ASTELIN) 0.1 % nasal spray Place 1 spray into both nostrils 2 (two) times daily. 30 mL 4  . busPIRone (BUSPAR) 15 MG tablet Take 1 tablet (15 mg total) by mouth 3 (three) times daily. 270 tablet 1  . cyclobenzaprine (FLEXERIL) 10 MG tablet Take 10 mg by mouth 2 (two) times daily as needed for muscle spasms.    Marland Kitchen diltiazem (CARDIZEM CD) 120 MG 24 hr capsule Take 120 mg by mouth daily.     Marland Kitchen EPINEPHrine (EPIPEN 2-PAK) 0.3 mg/0.3 mL IJ SOAJ injection Use as directed for severe allergic reaction 2 Device 1  . fexofenadine (ALLEGRA) 180 MG tablet Take 180 mg by mouth daily.    Marland Kitchen glimepiride (AMARYL) 2 MG tablet TK 1 T PO QD IN THE MORNING WITH BRE OR THE FIRST MAIN MEAL OF THE DAY  0  . hydrochlorothiazide (HYDRODIURIL) 25 MG tablet TAKE 25 MG BY MOUTH DAILY EVERY MORNING    . hydrOXYzine (ATARAX/VISTARIL) 25 MG tablet Take 1 tablet (25 mg total) by mouth every 6 (six) hours as needed. 270 tablet 1  . JANUMET 50-1000 MG tablet Take 1 tablet by mouth 2 (two) times daily with a meal.     . KLOR-CON M20 20 MEQ tablet Take 20 mEq by mouth daily.     Marland Kitchen LYRICA 100 MG capsule Take 100 mg by mouth  3 (three) times daily.     . mirtazapine (REMERON) 30 MG tablet Take 1 tablet (30 mg total) by mouth at bedtime. 90 tablet 1  . OXcarbazepine (TRILEPTAL) 150 MG tablet 2 tabs QAM & 3 tabs QHS 450 tablet 1  . oxybutynin (DITROPAN-XL) 10 MG 24 hr tablet TAKE 10 MG BY MOUTH DAILY    . oxyCODONE-acetaminophen (PERCOCET) 10-325 MG tablet Take by mouth.    . pravastatin (PRAVACHOL) 20 MG tablet Take 20 mg by mouth every evening.     Marland Kitchen SPIRIVA HANDIHALER 18 MCG inhalation capsule PLACE 1 CAPSULE INTO INHALER AND INHALE DAILY 30 capsule 0  . tiotropium (SPIRIVA) 18 MCG inhalation capsule Place 1 capsule (18 mcg total) into inhaler and inhale daily. 30 capsule 5  . [START ON 11/15/2018] zolpidem (AMBIEN)  10 MG tablet Take 1/2-1 tab 90 tablet 1  . albuterol (PROVENTIL) (2.5 MG/3ML) 0.083% nebulizer solution Take 3 mLs (2.5 mg total) by nebulization every 4 (four) hours as needed for wheezing or shortness of breath. 360 mL 1  . Albuterol Sulfate 108 (90 Base) MCG/ACT AEPB Inhale 2 puffs into the lungs every 4 (four) hours as needed. 3 each 1  . budesonide-formoterol (SYMBICORT) 160-4.5 MCG/ACT inhaler Inhale 2 puffs into the lungs 2 (two) times daily. 30.6 g 1  . fluticasone (FLONASE) 50 MCG/ACT nasal spray Use 1-2 sprays in each nostril in each nostril once daily 48 g 1  . montelukast (SINGULAIR) 10 MG tablet Take 1 tablet (10 mg total) by mouth at bedtime. 90 tablet 1  . Olopatadine HCl 0.2 % SOLN Apply 1 drop to eye daily. 3 Bottle 1  . Respiratory Therapy Supplies (NEBULIZER) DEVI Use as directed with nebulizer solution. 1 each 1   No current facility-administered medications for this visit.     Medication Side Effects: None  Allergies:  Allergies  Allergen Reactions  . Cozaar Cough  . Crestor [Rosuvastatin Calcium] Other (See Comments)    Makes her heart beat really fast.  . Carvedilol   . Cymbalta [Duloxetine Hcl] Other (See Comments)    Caused hair loss  . Fish Allergy   . Morphine And Related   . Rosuvastatin Calcium     Rapid heart rate  . Almond Meal Itching and Other (See Comments)    Tongue itches  . Almond Oil Itching and Other (See Comments)    Itching of tongue Itching of tongue  . Losartan Potassium     Cough    Past Medical History:  Diagnosis Date  . Allergic rhinoconjunctivitis   . Allergy history unknown   . Anxiety   . Arthritis    DDD, spondylosis  . Asthma   . Chronic back pain    Has had several surgeries.  He uses a walker  . Chronic kidney disease    renal calculi  . Depression   . Diabetes mellitus without complication (HCC)    dx in 2007  . Diabetes mellitus, type II (HCC)   . Eczema   . Headache(784.0)    sinus related   . History of  kidney stones   . Hx of pulmonary embolus 2017  . Hyperlipidemia   . Hypertension   . Insomnia   . Knee pain   . Left ankle pain   . Mental disorder   . Mood disorder (HCC)   . OSA on CPAP    PSG 05/24/2016: AHI 17/hour 02 mean 76%.  HSAT 06/13/17, AHI 11-hour 02 mean 79%  . Recurrent  upper respiratory infection (URI)   . Sleep apnea     Family History  Problem Relation Age of Onset  . Diabetes Sister   . Diabetes Brother   . Hypertension Brother   . Cancer Maternal Grandmother   . Heart failure Maternal Grandmother   . Hypertension Paternal Grandmother   . Asthma Daughter   . Cancer Daughter   . Depression Daughter   . Hypertension Daughter   . Heart failure Maternal Aunt   . Pneumonia Mother   . Diabetes Mother   . Alcoholism Father   . Alcohol abuse Father   . Depression Daughter   . Schizophrenia Grandchild   . Allergies Neg Hx   . Eczema Neg Hx   . Immunodeficiency Neg Hx     Social History   Socioeconomic History  . Marital status: Single    Spouse name: Not on file  . Number of children: 2  . Years of education: Not on file  . Highest education level: Not on file  Occupational History  . Not on file  Social Needs  . Financial resource strain: Not on file  . Food insecurity:    Worry: Not on file    Inability: Not on file  . Transportation needs:    Medical: Not on file    Non-medical: Not on file  Tobacco Use  . Smoking status: Never Smoker  . Smokeless tobacco: Never Used  Substance and Sexual Activity  . Alcohol use: Yes    Comment: rare use  . Drug use: No  . Sexual activity: Not Currently  Lifestyle  . Physical activity:    Days per week: Not on file    Minutes per session: Not on file  . Stress: Not on file  Relationships  . Social connections:    Talks on phone: Not on file    Gets together: Not on file    Attends religious service: Not on file    Active member of club or organization: Not on file    Attends meetings of clubs or  organizations: Not on file    Relationship status: Not on file  . Intimate partner violence:    Fear of current or ex partner: Not on file    Emotionally abused: Not on file    Physically abused: Not on file    Forced sexual activity: Not on file  Other Topics Concern  . Not on file  Social History Narrative   She is currently single.  Has 2 girls.  Has some college education.  Currently disabled due to chronic back pain and not employed.    Past Medical History, Surgical history, Social history, and Family history were reviewed and updated as appropriate.   Please see review of systems for further details on the patient's review from today.   Objective:   Physical Exam:  BP (!) 147/118   Pulse 93   Physical Exam  Constitutional: She is oriented to person, place, and time. She appears well-developed. No distress.  Musculoskeletal: She exhibits no deformity.  Neurological: She is alert and oriented to person, place, and time. Coordination normal.  Psychiatric: She has a normal mood and affect. Her speech is normal and behavior is normal. Judgment and thought content normal. Her mood appears not anxious. Her affect is not angry, not blunt, not labile and not inappropriate. Cognition and memory are normal. She does not exhibit a depressed mood. She expresses no homicidal and no suicidal ideation. She expresses no suicidal  plans and no homicidal plans.  Insight intact. No auditory or visual hallucinations. No delusions.     Lab Review:     Component Value Date/Time   NA 134 (L) 05/23/2017 0426   NA 134 (A) 05/23/2017   K 4.6 05/23/2017 0426   CL 105 05/23/2017 0426   CO2 21 (L) 05/23/2017 0426   GLUCOSE 253 (H) 05/23/2017 0426   BUN 14 05/23/2017 0426   BUN 14 05/23/2017   CREATININE 0.67 05/23/2017 0426   CALCIUM 8.8 (L) 05/23/2017 0426   GFRNONAA >60 05/23/2017 0426   GFRAA >60 05/23/2017 0426       Component Value Date/Time   WBC 4.6 11/08/2018 1041   WBC 13.1 (H)  05/24/2017 0425   RBC 4.88 11/08/2018 1041   RBC 4.64 05/24/2017 0425   HGB 12.5 11/08/2018 1041   HCT 39.5 11/08/2018 1041   PLT 321 05/24/2017 0425   MCV 81 11/08/2018 1041   MCH 25.6 (L) 11/08/2018 1041   MCH 25.6 (L) 05/24/2017 0425   MCHC 31.6 11/08/2018 1041   MCHC 31.0 05/24/2017 0425   RDW 14.3 11/08/2018 1041   LYMPHSABS 1.4 11/08/2018 1041   MONOABS 0.8 05/11/2017 0930   EOSABS 0.2 11/08/2018 1041   BASOSABS 0.0 11/08/2018 1041    No results found for: POCLITH, LITHIUM   No results found for: PHENYTOIN, PHENOBARB, VALPROATE, CBMZ   .res Assessment: Plan:   Continue current medications. Discussed strategies to improve altered sleep/wake cycle. Encouraged pt to talk with therapist about strategies to decrease worry about family member working at night if sleep disturbance persists.  Bipolar II disorder (HCC) - Plan: ARIPiprazole (ABILIFY) 2 MG tablet, mirtazapine (REMERON) 30 MG tablet, OXcarbazepine (TRILEPTAL) 150 MG tablet  Primary insomnia - Plan: zolpidem (AMBIEN) 10 MG tablet, mirtazapine (REMERON) 30 MG tablet, OXcarbazepine (TRILEPTAL) 150 MG tablet  Anxiety disorder, unspecified type - Plan: busPIRone (BUSPAR) 15 MG tablet, hydrOXYzine (ATARAX/VISTARIL) 25 MG tablet, OXcarbazepine (TRILEPTAL) 150 MG tablet  Please see After Visit Summary for patient specific instructions.  Future Appointments  Date Time Provider Department Center  11/22/2018  8:00 AM Mathis Fare, LCSW [facility] None  12/14/2018 10:00 AM Mathis Fare, LCSW [facility] None  02/01/2019  7:45 AM Helane Gunther, DPM [facility]  02/22/2019 10:20 AM Padgett, Pilar Grammes, MD [facility] None    No orders of the defined types were placed in this encounter.     -------------------------------

## 2018-11-08 ENCOUNTER — Ambulatory Visit (INDEPENDENT_AMBULATORY_CARE_PROVIDER_SITE_OTHER): Payer: Medicare Other | Admitting: Allergy

## 2018-11-08 ENCOUNTER — Encounter: Payer: Self-pay | Admitting: Allergy

## 2018-11-08 VITALS — BP 122/90 | HR 85 | Temp 98.3°F | Resp 18

## 2018-11-08 DIAGNOSIS — L2089 Other atopic dermatitis: Secondary | ICD-10-CM

## 2018-11-08 DIAGNOSIS — J3089 Other allergic rhinitis: Secondary | ICD-10-CM

## 2018-11-08 DIAGNOSIS — J4540 Moderate persistent asthma, uncomplicated: Secondary | ICD-10-CM | POA: Diagnosis not present

## 2018-11-08 DIAGNOSIS — T7800XD Anaphylactic reaction due to unspecified food, subsequent encounter: Secondary | ICD-10-CM

## 2018-11-08 DIAGNOSIS — H1013 Acute atopic conjunctivitis, bilateral: Secondary | ICD-10-CM

## 2018-11-08 MED ORDER — FLUTICASONE PROPIONATE 50 MCG/ACT NA SUSP: NASAL | 1 refills | Status: DC

## 2018-11-08 MED ORDER — ALBUTEROL SULFATE 108 (90 BASE) MCG/ACT IN AEPB: 2.0000 | INHALATION_SPRAY | RESPIRATORY_TRACT | 1 refills | Status: DC | PRN

## 2018-11-08 MED ORDER — OLOPATADINE HCL 0.2 % OP SOLN: 1.0000 [drp] | Freq: Every day | OPHTHALMIC | 1 refills | Status: DC

## 2018-11-08 MED ORDER — BUDESONIDE-FORMOTEROL FUMARATE 160-4.5 MCG/ACT IN AERO: 2.0000 | INHALATION_SPRAY | Freq: Two times a day (BID) | RESPIRATORY_TRACT | 1 refills | Status: DC

## 2018-11-08 MED ORDER — ALBUTEROL SULFATE (2.5 MG/3ML) 0.083% IN NEBU: 2.5000 mg | INHALATION_SOLUTION | RESPIRATORY_TRACT | 1 refills | Status: DC | PRN

## 2018-11-08 MED ORDER — MONTELUKAST SODIUM 10 MG PO TABS: 10.0000 mg | ORAL_TABLET | Freq: Every day | ORAL | 1 refills | Status: DC

## 2018-11-08 NOTE — Patient Instructions (Addendum)
Allergic rhinoconjunctivitis - severe nasal congestion with obstruction thus will treat with prednisone 20mg  twice a day x 7 days.   - Continue to use Flonase 2 sprays twice a day at this time until your nasal congestion is improved then decrease down to Flonase 2 sprays once a day.  Use flonase for 1-2 weeks at a time before stopping once symptoms improve. 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 - Carry EpiPen at all times.   Flexural atopic dermatitis - Continue moisturizing routine with Aveeno - Use triamcinolone 0.1% below the face.    Moderate persistent asthma -Continue Symbicort 160/2 puffs twice daily -Continue Spiriva 2 puffs daily/take midday -continue singulair as above -have access to albuterol inhaler 2 puffs every 4-6 hours as needed for cough/wheeze/shortness of breath/chest tightness.  May use 15-20 minutes prior to activity.   Monitor frequency of use.   -will obtain CBC w diff and environmental panel to determine which biologic asthma medication (Nucala, Fasenra, Dupixent or Xolair) you may qualify for to improve you asthma control.    Follow up in 3-4 months or sooner if needed.

## 2018-11-08 NOTE — Progress Notes (Signed)
Follow-up Note  RE: Chelsea Benson MRN: [id number] DOB: 1951-08-07 Date of Office Visit: 11/08/2018   History of present illness: Chelsea Benson is a 67 y.o. female presenting today for follow-up of asthma, allergic rhinitis with conjunctivitis, food allergy and eczema.  She was last seen in the office on July 12, 2018 by myself. She states over the past week she has been having itchy eyes, nasal congestion, cough currently.  She states a lady got on the bus the other day with strong perfume which may be a trigger of her current symptoms.  She denies any fever.  She has been using Flonase 2 sprays twice daily but states that her congestion has not improved.  She has seen her eye doctor recently who recommended she use BLINK lubricating eye drop which she states has helped.  She also has olopatadine eyedrop that she states is also helpful but reports that the bottle is too small and that she ends up running out of the medication pretty quickly.  She does continue to take Singulair as well as Allegra daily.  She also has nasal Astelin for drainage. With her asthma she states that her cough currently is a little bit worse than her normal cough.  She states she is needing to use albuterol 3-4 times a week.  She is on Symbicort 160 mcg 2 puffs twice a day as well as Spiriva 2 puffs daily. She continues to avoid almonds and has not had any accidental ingestions or need to use her epinephrine device. She has a dermatology appt in 2 weeks.  She states she has been breaking out and the triamcinolone has not been working for her   Review of systems: Review of Systems  Constitutional: Negative for chills, fever and malaise/fatigue.  HENT: Positive for congestion. Negative for ear discharge, ear pain, nosebleeds, sinus pain and sore throat.   Eyes: Negative for pain, discharge and redness.  Respiratory: Positive for cough, shortness of breath and wheezing. Negative for hemoptysis and sputum  production.   Cardiovascular: Negative for chest pain.  Gastrointestinal: Negative for abdominal pain, constipation, diarrhea, heartburn, nausea and vomiting.  Musculoskeletal: Negative for joint pain.  Skin: Positive for itching and rash.  Neurological: Negative for headaches.    All other systems negative unless noted above in HPI  Past medical/social/surgical/family history have been reviewed and are unchanged unless specifically indicated below.  No changes  Medication List: Allergies as of 11/08/2018      Reactions   Cozaar Cough   Crestor [rosuvastatin Calcium] Other (See Comments)   Makes her heart beat really fast.   Carvedilol    Cymbalta [duloxetine Hcl] Other (See Comments)   Caused hair loss   Fish Allergy    Morphine And Related    Rosuvastatin Calcium    Rapid heart rate   Almond Meal Itching, Other (See Comments)   Tongue itches   Almond Oil Itching, Other (See Comments)   Itching of tongue Itching of tongue   Losartan Potassium    Cough      Medication List        Accurate as of 11/08/18  2:19 PM. Always use your most recent med list.          albuterol 108 (90 Base) MCG/ACT inhaler Commonly known as:  PROVENTIL HFA;VENTOLIN HFA Inhale 2 puffs into the lungs every 4 (four) hours as needed. For wheezing   albuterol (2.5 MG/3ML) 0.083% nebulizer solution Commonly known as:  PROVENTIL Take  3 mLs (2.5 mg total) by nebulization every 4 (four) hours as needed for wheezing or shortness of breath.   Albuterol Sulfate 108 (90 Base) MCG/ACT Aepb Inhale 2 puffs into the lungs every 4 (four) hours as needed.   ARIPiprazole 2 MG tablet Commonly known as:  ABILIFY Take 1 tablet (2 mg total) by mouth daily.   aspirin EC 325 MG tablet Take 1 tablet (325 mg total) by mouth 2 (two) times daily.   azelastine 0.1 % nasal spray Commonly known as:  ASTELIN Place 1 spray into both nostrils 2 (two) times daily.   budesonide-formoterol 160-4.5 MCG/ACT  inhaler Commonly known as:  SYMBICORT Inhale 2 puffs into the lungs 2 (two) times daily.   busPIRone 15 MG tablet Commonly known as:  BUSPAR Take 1 tablet (15 mg total) by mouth 3 (three) times daily.   cyclobenzaprine 10 MG tablet Commonly known as:  FLEXERIL Take 10 mg by mouth 2 (two) times daily as needed for muscle spasms.   diltiazem 120 MG 24 hr capsule Commonly known as:  CARDIZEM CD Take 120 mg by mouth daily.   EPINEPHrine 0.3 mg/0.3 mL Soaj injection Commonly known as:  EPI-PEN Use as directed for severe allergic reaction   fexofenadine 180 MG tablet Commonly known as:  ALLEGRA Take 180 mg by mouth daily.   fluticasone 50 MCG/ACT nasal spray Commonly known as:  FLONASE Use 1-2 sprays in each nostril in each nostril once daily   glimepiride 2 MG tablet Commonly known as:  AMARYL TK 1 T PO QD IN THE MORNING WITH BRE OR THE FIRST MAIN MEAL OF THE DAY   hydrochlorothiazide 25 MG tablet Commonly known as:  HYDRODIURIL TAKE 25 MG BY MOUTH DAILY EVERY MORNING   hydrOXYzine 25 MG tablet Commonly known as:  ATARAX/VISTARIL Take 1 tablet (25 mg total) by mouth every 6 (six) hours as needed.   JANUMET 50-1000 MG tablet Generic drug:  sitaGLIPtin-metformin Take 1 tablet by mouth 2 (two) times daily with a meal.   KLOR-CON M20 20 MEQ tablet Generic drug:  potassium chloride SA Take 20 mEq by mouth daily.   LYRICA 100 MG capsule Generic drug:  pregabalin Take 100 mg by mouth 3 (three) times daily.   mirtazapine 30 MG tablet Commonly known as:  REMERON Take 1 tablet (30 mg total) by mouth at bedtime.   montelukast 10 MG tablet Commonly known as:  SINGULAIR Take 1 tablet (10 mg total) by mouth at bedtime.   Nebulizer Butner Use as directed with nebulizer solution.   Olopatadine HCl 0.2 % Soln Apply 1 drop to eye daily.   OXcarbazepine 150 MG tablet Commonly known as:  TRILEPTAL 2 tabs QAM & 3 tabs QHS   oxybutynin 10 MG 24 hr tablet Commonly known as:   DITROPAN-XL TAKE 10 MG BY MOUTH DAILY   oxyCODONE-acetaminophen 10-325 MG tablet Commonly known as:  PERCOCET Take by mouth.   pravastatin 20 MG tablet Commonly known as:  PRAVACHOL Take 20 mg by mouth every evening.   tiotropium 18 MCG inhalation capsule Commonly known as:  SPIRIVA Place 1 capsule (18 mcg total) into inhaler and inhale daily.   SPIRIVA HANDIHALER 18 MCG inhalation capsule Generic drug:  tiotropium PLACE 1 CAPSULE INTO INHALER AND INHALE DAILY   zolpidem 10 MG tablet Commonly known as:  AMBIEN Take 1/2-1 tab Start taking on:  11/15/2018       Known medication allergies: Allergies  Allergen Reactions  . Cozaar Cough  .  Crestor [Rosuvastatin Calcium] Other (See Comments)    Makes her heart beat really fast.  . Carvedilol   . Cymbalta [Duloxetine Hcl] Other (See Comments)    Caused hair loss  . Fish Allergy   . Morphine And Related   . Rosuvastatin Calcium     Rapid heart rate  . Almond Meal Itching and Other (See Comments)    Tongue itches  . Almond Oil Itching and Other (See Comments)    Itching of tongue Itching of tongue  . Losartan Potassium     Cough     Physical examination: Blood pressure 122/90, pulse 85, temperature 98.3 F (36.8 C), temperature source Oral, resp. rate 18, SpO2 93 %.  General: Alert, interactive, in no acute distress. HEENT: PERRLA, TMs pearly gray, turbinates markedly edematous with complete obstruction on the left nares and near obstruction of the right nares,  with clear discharge, post-pharynx non erythematous. Neck: Supple without lymphadenopathy. Lungs: Clear to auscultation without wheezing, rhonchi or rales. {no increased work of breathing. CV: Normal S1, S2 without murmurs. Abdomen: Nondistended, nontender. Skin: Warm and dry, without lesions or rashes. Extremities:  No clubbing, cyanosis or edema. Neuro:   Grossly intact.  Diagnositics/Labs:  Spirometry: FEV1: 1.07L 55%, FVC: 1.44L 57%.  Study is  stable from previous study  Assessment and plan:     Allergic rhinitis with conjunctivitis - severe nasal congestion with obstruction thus will treat with prednisone 20mg  twice a day x 7 days.   - Continue to use Flonase 2 sprays twice a day at this time until your nasal congestion is improved then decrease down to Flonase 2 sprays once a day.  Use flonase for 1-2 weeks at a time before stopping once symptoms improve. 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 - Carry EpiPen at all times.   Flexural atopic dermatitis - Continue moisturizing routine with Aveeno - Use triamcinolone 0.1% below the face and she has a dermatology appointment upcoming to discuss changing her topical therapies.  She may benefit from [place] [facility].  Will provide with samples.  I am okay this sound like a good call he is in [place]   Moderate persistent asthma -Not well controlled with frequent albuterol use -Continue Symbicort 160/2 puffs twice daily -Continue Spiriva 2 puffs daily/take midday -continue singulair as above -have access to albuterol inhaler 2 puffs every 4-6 hours as needed for cough/wheeze/shortness of breath/chest tightness.  May use 15-20 minutes prior to activity.   Monitor frequency of use.   -will obtain CBC w diff and environmental panel to determine which biologic asthma medication (Nucala, Fasenra, Dupixent or Xolair) you may qualify for to improve you asthma control.    Follow up in 3-4 months or sooner if needed.    I appreciate the opportunity to take part in Rhilyn's care. Please do not hesitate to contact me with questions.  Sincerely,   Margo AyeShaylar Padgett,  MD Allergy/Immunology [facility]

## 2018-11-09 ENCOUNTER — Ambulatory Visit (INDEPENDENT_AMBULATORY_CARE_PROVIDER_SITE_OTHER): Payer: Medicare Other | Admitting: Psychiatry

## 2018-11-09 DIAGNOSIS — F3181 Bipolar II disorder: Secondary | ICD-10-CM | POA: Diagnosis not present

## 2018-11-09 NOTE — Progress Notes (Signed)
      [facility] Counselor/Therapist Progress Note  Patient ID: Chelsea Benson, MRN: [id number],    Date: 11/09/2018  Time Spent: 58 minutes  Treatment Type: Individual Therapy  Reported Symptoms:  Not sleeping as well, some anxiety and irritability  Mental Status Exam:  Appearance:   Casual     Behavior:  Appropriate and Sharing  Motor:  Normal  Speech/Language:   Normal Rate  Affect:  Congruent  Mood:  anxious  Thought process:  normal  Thought content:    WNL  Sensory/Perceptual disturbances:    WNL  Orientation:  oriented to person, place, time/date, situation, day of week, month of year and year  Attention:  Good  Concentration:  Good  Memory:  WNL  Fund of knowledge:   Good  Insight:    Good  Judgment:   Good  Impulse Control:  Good   Risk Assessment: Danger to Self:  No Self-injurious Behavior: No Danger to Others: No Duty to Warn:no Physical Aggression / Violence:No  Access to Firearms a concern: No  Gang Involvement:No   Subjective:  Patient  In today with some anxiety, irritability, and not sleeping as well most recently.  Happy that the grandson that live with her just recently got a job at a bar/grill.  Feels that her not sleeping as well is due to her grandon starting his job and is leaving home at different times which has affected her sleep, however patient feels she is getting used to his schedule now and past 2 nights she has slept better.  Talked through some of her heighened  frustrations with some extended family and other people and seemed to feel less stressed and less irritable after talking.  Looked at some choices she has in making decisions/setting boundaries in her family interactions.   Interventions: Solution-Oriented/Positive Psychology and Ego-Supportive  Diagnosis:   ICD-10-CM   1. Bipolar II disorder (HCC) F31.81     Plan:   Encouraged good self-care and to follow through in setting boundaries in situations that irritate her or  with people that she feels use her.  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  Will need to change appts to Fridays.  Mathis Fareeborah Burk Hoctor, LCSW

## 2018-11-11 LAB — ALLERGENS W/TOTAL IGE AREA 2
Alternaria Alternata IgE: 1 kU/L — AB
Aspergillus Fumigatus IgE: 0.75 kU/L — AB
Bermuda Grass IgE: 0.13 kU/L — AB
Cat Dander IgE: 15.1 kU/L — AB
Cedar, Mountain IgE: 0.19 kU/L — AB
Cladosporium Herbarum IgE: 0.76 kU/L — AB
Cockroach, German IgE: 0.44 kU/L — AB
Common Silver Birch IgE: 0.2 kU/L — AB
Cottonwood IgE: 0.19 kU/L — AB
D Farinae IgE: 1.86 kU/L — AB
D Pteronyssinus IgE: 1.94 kU/L — AB
Dog Dander IgE: 8.48 kU/L — AB
Elm, American IgE: 0.87 kU/L — AB
IgE (Immunoglobulin E), Serum: 1487 IU/mL — ABNORMAL HIGH (ref 6–495)
Johnson Grass IgE: 0.16 kU/L — AB
Maple/Box Elder IgE: 0.14 kU/L — AB
Mouse Urine IgE: 0.21 kU/L — AB
Oak, White IgE: 0.19 kU/L — AB
Pecan, Hickory IgE: 0.49 kU/L — AB
Penicillium Chrysogen IgE: 0.59 kU/L — AB
Pigweed, Rough IgE: 0.31 kU/L — AB
Sheep Sorrel IgE Qn: 0.12 kU/L — AB
W001-IGE RAGWEED, SHORT: 8.68 kU/L — AB
WHITE MULBERRY IGE: 5.19 kU/L — AB

## 2018-11-11 LAB — CBC WITH DIFFERENTIAL
Basophils Absolute: 0 10*3/uL (ref 0.0–0.2)
Basos: 1 %
EOS (ABSOLUTE): 0.2 10*3/uL (ref 0.0–0.4)
EOS: 4 %
HEMATOCRIT: 39.5 % (ref 34.0–46.6)
Hemoglobin: 12.5 g/dL (ref 11.1–15.9)
Immature Grans (Abs): 0 10*3/uL (ref 0.0–0.1)
Immature Granulocytes: 0 %
LYMPHS: 31 %
Lymphocytes Absolute: 1.4 10*3/uL (ref 0.7–3.1)
MCH: 25.6 pg — ABNORMAL LOW (ref 26.6–33.0)
MCHC: 31.6 g/dL (ref 31.5–35.7)
MCV: 81 fL (ref 79–97)
Monocytes Absolute: 0.5 10*3/uL (ref 0.1–0.9)
Monocytes: 11 %
NEUTROS ABS: 2.4 10*3/uL (ref 1.4–7.0)
Neutrophils: 53 %
RBC: 4.88 x10E6/uL (ref 3.77–5.28)
RDW: 14.3 % (ref 12.3–15.4)
WBC: 4.6 10*3/uL (ref 3.4–10.8)

## 2018-11-13 ENCOUNTER — Telehealth: Payer: Self-pay | Admitting: Allergy

## 2018-11-13 NOTE — Telephone Encounter (Signed)
Attempted to contact patient.  LM for return call.  Documented under result notes.

## 2018-11-13 NOTE — Telephone Encounter (Signed)
Pt returning a nurse call about labs [phone].

## 2018-11-15 ENCOUNTER — Ambulatory Visit: Payer: Medicare Other | Admitting: Allergy

## 2018-11-20 ENCOUNTER — Telehealth: Payer: Self-pay | Admitting: *Deleted

## 2018-11-20 NOTE — Telephone Encounter (Signed)
Patient states she got approved for her allergy injection and she is ready to schedule a injection.

## 2018-11-20 NOTE — Telephone Encounter (Signed)
Called and reviewed process with patient again. I advised her that Xolair was approved but awaiting [facility] to contact her to get ok to ship before to start therapy

## 2018-11-20 NOTE — Telephone Encounter (Signed)
FYI Tammy  

## 2018-11-22 ENCOUNTER — Ambulatory Visit (INDEPENDENT_AMBULATORY_CARE_PROVIDER_SITE_OTHER): Payer: Medicare Other | Admitting: Psychiatry

## 2018-11-22 DIAGNOSIS — F3181 Bipolar II disorder: Secondary | ICD-10-CM

## 2018-11-22 NOTE — Progress Notes (Signed)
      [facility] Counselor/Therapist Progress Note  Patient ID: Chelsea Benson, MRN: [name],    Date: 11/22/2018  Time Spent:  60 minutes  Treatment Type: Individual Therapy  Reported Symptoms:  Frustration, anxiety, stress, slight agitation  Mental Status Exam:  Appearance:   Neat     Behavior:  Appropriate and Sharing  Motor:  Normal  Speech/Language:   Normal Rate  Affect:  frustrated, anxious, irritable  Mood:  anxious and irritable  Thought process:  normal  Thought content:    WNL  Sensory/Perceptual disturbances:    WNL  Orientation:  oriented to person, place, time/date, situation, day of week, month of year and year  Attention:  Good  Concentration:  Good  Memory:  Immediate;   Fair Recent;   Fair  Progress EnergyFund of knowledge:   Good  Insight:    Fair  Judgment:   Good  Impulse Control:  Good   Risk Assessment: Danger to Self:  No Self-injurious Behavior: No Danger to Others: No Duty to Warn:no Physical Aggression / Violence:No  Access to Firearms a concern: No  Gang Involvement:No   Subjective:  Patient in today agitated, frustrated, anxious, and stressed due to personal and family issues.  Did hear from SS that her monthly check will have an increase and also heard from DSS that her food stamps will decrease some.  Is checking back with doctor in January as they may do an updated sleep test.  Has symptoms mentioned above but no mood swings currently.  Discussed upcoming holiday time with family and how sometimes tension can be a part of the gathering.  Shared ways that patient can cope with the many different personalities that will likely be attending, and how she can interact in ways that will be healthy for herself and allow for limit-setting as needed.  Interventions: Solution-Oriented/Positive Psychology and Ego-Supportive  Diagnosis:   ICD-10-CM   1. Bipolar II disorder (HCC) F31.81     Plan: Patient to focus on her self care (physical, mental,  emotional, staying on meds as prescribed) and set healthy limits with extended family.  Acknowledges she needs to get more rest, but says she doesn't have time, so we talked about how she could plan some additional alone time to allow for more rest.    Chelsea Fareeborah Heavenly Christine, LCSW

## 2018-12-05 ENCOUNTER — Other Ambulatory Visit: Payer: Self-pay | Admitting: Allergy

## 2018-12-06 ENCOUNTER — Ambulatory Visit (INDEPENDENT_AMBULATORY_CARE_PROVIDER_SITE_OTHER): Payer: 59 | Admitting: *Deleted

## 2018-12-06 DIAGNOSIS — J4540 Moderate persistent asthma, uncomplicated: Secondary | ICD-10-CM | POA: Diagnosis not present

## 2018-12-06 MED ORDER — OMALIZUMAB 150 MG ~~LOC~~ SOLR
375.0000 mg | SUBCUTANEOUS | Status: DC
  Administered 2018-12-06 – 2019-02-12 (×5): 375 mg via SUBCUTANEOUS

## 2018-12-06 NOTE — Progress Notes (Signed)
Immunotherapy   Patient Details  Name: KELSEI CHADICK MRN: [id number] Date of Birth: Apr 18, 1951  12/06/2018  [name] started injections for  Geoffry Paradise 375mg   Frequency: Every 2 Weeks  Epi-Pen: Yes  Consent signed and patient instructions given.  Patient waited full 30 minutes without any issues.     Michel Harrow R 12/06/2018, 2:49 PM

## 2018-12-14 ENCOUNTER — Ambulatory Visit: Payer: Self-pay

## 2018-12-14 ENCOUNTER — Ambulatory Visit (INDEPENDENT_AMBULATORY_CARE_PROVIDER_SITE_OTHER): Payer: 59 | Admitting: Psychiatry

## 2018-12-14 DIAGNOSIS — F3181 Bipolar II disorder: Secondary | ICD-10-CM | POA: Diagnosis not present

## 2018-12-14 NOTE — Progress Notes (Signed)
      [facility] Counselor/Therapist Progress Note  Patient ID: Chelsea Benson, MRN: [id number],    Date: 12/14/2018  Time Spent: 60 minutes   Treatment Type: Individual Therapy  Reported Symptoms:  Anxiety, anger, frustration  Mental Status Exam:  Appearance:   Casual     Behavior:  Appropriate and Sharing  Motor:  Normal  Speech/Language:   Normal Rate  Affect:  Congruent  Mood:  anxious  Thought process:  normal  Thought content:    WNL  Sensory/Perceptual disturbances:    WNL  Orientation:  oriented to person, place, time/date, situation, day of week, month of year and year  Attention:  Good  Concentration:  Good  Memory:  Immediate;   Fair Recent;   Fair  Progress EnergyFund of knowledge:   Good  Insight:    Fair  Judgment:   Good  Impulse Control:  Good   Risk Assessment: Danger to Self:  No Self-injurious Behavior: No Danger to Others: No Duty to Warn:no Physical Aggression / Violence:No  Access to Firearms a concern: No  Gang Involvement:No   Subjective: Patient in today reporting some anxiety, anger, and frustration.  States that her anger and frustration is mostly related to her recent start of culinary classes at [employer].  Frustrated that "I have to sit in class when I already know what they're talking about."  Agrees that as time progresses, the courses will likely get more advanced to where she will be learning new information.  Denies and depression.  Does feel she is working through her anger and frustrations with her schooling and different issues involved there.  Reports that they have accommodated her since she can't physically manage the steps.    Interventions: Cognitive Behavioral Therapy and Ego-Supportive  Diagnosis:   ICD-10-CM   1. Bipolar II disorder (HCC) F31.81     Plan: Continue on meds as prescribed.  Urged self-care for patient, continuing her outside activities, and   Mathis FareDeborah Leonell Lobdell, [place]

## 2018-12-21 ENCOUNTER — Ambulatory Visit (INDEPENDENT_AMBULATORY_CARE_PROVIDER_SITE_OTHER): Payer: 59 | Admitting: *Deleted

## 2018-12-21 DIAGNOSIS — J4540 Moderate persistent asthma, uncomplicated: Secondary | ICD-10-CM | POA: Diagnosis not present

## 2018-12-25 ENCOUNTER — Telehealth: Payer: Self-pay | Admitting: *Deleted

## 2018-12-25 ENCOUNTER — Other Ambulatory Visit: Payer: Self-pay | Admitting: *Deleted

## 2018-12-25 MED ORDER — MOMETASONE FURO-FORMOTEROL FUM 200-5 MCG/ACT IN AERO: 2.0000 | INHALATION_SPRAY | Freq: Two times a day (BID) | RESPIRATORY_TRACT | 5 refills | Status: DC

## 2018-12-25 NOTE — Telephone Encounter (Signed)
Received a letter from [employer] stating that Symbicort 160 is no longer on the patient's Medicare Formulary. They will be sending the patient a 30 day supply as a courtesy. It is not stated what inhalers are preferred in the letter but it does state that an alternative will need to be prescribed of or the physician can write reasoning as to why the patient should remain on this medication. Please advise.

## 2018-12-25 NOTE — Telephone Encounter (Signed)
Dulera 200mg  has been sent to [facility] on [address]. Called patient and let voicemail asking to return call to discuss medication change.

## 2018-12-25 NOTE — Telephone Encounter (Signed)
Ok please try for either Dulera 2 puffs twice a day or Breo 1 puff daily.

## 2018-12-25 NOTE — Telephone Encounter (Signed)
Patient called back. Advised patient of change in medication. Patient verbalized understanding.

## 2018-12-26 ENCOUNTER — Telehealth: Payer: Self-pay | Admitting: *Deleted

## 2018-12-26 NOTE — Telephone Encounter (Signed)
Called and advised that it may be the 30 day supply of Symbicort that insurance said that they would dispense. Encouraged Chelsea Benson to still pick up the [facility] that is covered just in case it is only a 30 day supply of Symbicort as a courtesy. Patient verbalized understanding.

## 2018-12-26 NOTE — Telephone Encounter (Signed)
Patient called stating [employer] [facility] approved her medications but a nurse called her from here yesterday and said it hasn't been approved. Please advise

## 2018-12-28 ENCOUNTER — Ambulatory Visit (INDEPENDENT_AMBULATORY_CARE_PROVIDER_SITE_OTHER): Payer: 59 | Admitting: Psychiatry

## 2018-12-28 DIAGNOSIS — F3181 Bipolar II disorder: Secondary | ICD-10-CM | POA: Diagnosis not present

## 2018-12-28 NOTE — Progress Notes (Signed)
      [facility] Counselor/Therapist Progress Note  Patient ID: Chelsea Benson, MRN: [id number],    Date: 12/28/2018  Time Spent: 58 minutes  Treatment Type: Individual Therapy  Reported Symptoms:   Frustration (but has lessened), anxiety   Mental Status Exam:  Appearance:   Neat     Behavior:  Appropriate and Sharing  Motor:  Normal  Speech/Language:   Normal Rate  Affect:  frustrated, anxious  Mood:  anxious  Thought process:  normal  Thought content:    WNL  Sensory/Perceptual disturbances:    WNL  Orientation:  oriented to person, place, time/date, situation, day of week, month of year and year  Attention:  Good  Concentration:  Good  Memory:  WNL  Fund of knowledge:   Good  Insight:    Fair  Judgment:   Good  Impulse Control:  Fair   Risk Assessment: Danger to Self:  No Self-injurious Behavior: No Danger to Others: No Duty to Warn:no Physical Aggression / Violence:No  Access to Firearms a concern: No  Gang Involvement:No   Subjective:  Patient in today with some anxiety and frustration although frustration level is less.  Issues with her classes at [employer] and concerns re: adult grandson and recent quitting his job. Vented a lot of her concerns and some irritability "at the system". Focused on what she can control and what she cannot control.  Discussed strategies to help her manage anxiety as well as some anger/frustration that seems to arise when frustrated with others in her classes.  Also encouraged good self-care and to remain on meds as prescribed.  Interventions: Solution-Oriented/Positive Psychology and Ego-Supportive  Diagnosis:   ICD-10-CM   1. Bipolar II disorder (HCC) F31.81     Plan:  Patient to follow through in using the strategies mentioned above, remembering when she gets angry or frustrated, to be mindful of what is and is not in her control, and really think about what would be the best response for her.   To return within 2-3 wks.    Chelsea Fareeborah Jerilynn Feldmeier, LCSW

## 2019-01-04 ENCOUNTER — Ambulatory Visit (INDEPENDENT_AMBULATORY_CARE_PROVIDER_SITE_OTHER): Payer: 59 | Admitting: *Deleted

## 2019-01-04 DIAGNOSIS — J4540 Moderate persistent asthma, uncomplicated: Secondary | ICD-10-CM

## 2019-01-11 ENCOUNTER — Ambulatory Visit: Payer: Medicare Other | Admitting: Psychiatry

## 2019-01-18 ENCOUNTER — Ambulatory Visit (INDEPENDENT_AMBULATORY_CARE_PROVIDER_SITE_OTHER): Payer: 59

## 2019-01-18 ENCOUNTER — Ambulatory Visit: Payer: Medicare Other | Admitting: Cardiology

## 2019-01-18 DIAGNOSIS — J4540 Moderate persistent asthma, uncomplicated: Secondary | ICD-10-CM

## 2019-01-25 ENCOUNTER — Ambulatory Visit: Payer: 59 | Admitting: Psychiatry

## 2019-02-01 ENCOUNTER — Ambulatory Visit (INDEPENDENT_AMBULATORY_CARE_PROVIDER_SITE_OTHER): Payer: 59 | Admitting: Podiatry

## 2019-02-01 ENCOUNTER — Encounter: Payer: Self-pay | Admitting: Podiatry

## 2019-02-01 ENCOUNTER — Ambulatory Visit: Payer: Self-pay

## 2019-02-01 DIAGNOSIS — M79675 Pain in left toe(s): Secondary | ICD-10-CM | POA: Diagnosis not present

## 2019-02-01 DIAGNOSIS — M79674 Pain in right toe(s): Secondary | ICD-10-CM

## 2019-02-01 DIAGNOSIS — B351 Tinea unguium: Secondary | ICD-10-CM

## 2019-02-01 DIAGNOSIS — E1159 Type 2 diabetes mellitus with other circulatory complications: Secondary | ICD-10-CM

## 2019-02-01 NOTE — Progress Notes (Signed)
Complaint:  Visit Type: Patient returns to my office for continued preventative foot care services. Complaint: Patient states" my nails have grown long and thick and become painful to walk and wear shoes" Patient has been diagnosed with DM with no foot complications. The patient presents for preventative foot care services. No changes to ROS  Podiatric Exam: Vascular: dorsalis pedis and posterior tibial pulses are palpable bilateral. Capillary return is immediate. Temperature gradient is WNL. Skin turgor WNL  Sensorium: Normal Semmes Weinstein monofilament test. Normal tactile sensation bilaterally. Nail Exam: Pt has thick disfigured discolored nails with subungual debris noted bilateral entire nail hallux through fifth toenails Ulcer Exam: There is no evidence of ulcer or pre-ulcerative changes or infection. Orthopedic Exam: Muscle tone and strength are WNL. No limitations in general ROM. No crepitus or effusions noted. Foot type and digits show no abnormalities. Bony prominences are unremarkable. Skin: No Porokeratosis. No infection or ulcers  Diagnosis:  Onychomycosis, , Pain in right toe, pain in left toes  Treatment & Plan Procedures and Treatment: Consent by patient was obtained for treatment procedures.   Debridement of mycotic and hypertrophic toenails, 1 through 5 bilateral and clearing of subungual debris. No ulceration, no infection noted.  Return Visit-Office Procedure: Patient instructed to return to the office for a follow up visit 3 months for continued evaluation and treatment.    Tehilla Coffel DPM 

## 2019-02-08 ENCOUNTER — Telehealth: Payer: Self-pay | Admitting: Allergy

## 2019-02-08 ENCOUNTER — Ambulatory Visit: Payer: Self-pay

## 2019-02-08 DIAGNOSIS — J4540 Moderate persistent asthma, uncomplicated: Secondary | ICD-10-CM

## 2019-02-08 MED ORDER — SYMBICORT 160-4.5 MCG/ACT IN AERO: 2.0000 | INHALATION_SPRAY | Freq: Two times a day (BID) | RESPIRATORY_TRACT | 0 refills | Status: DC

## 2019-02-08 NOTE — Telephone Encounter (Signed)
Patient was switched to [facility] in 12/2018 due to insurance coverage concern. Patient is requesting she be changed back to the Symbicort because her insurance will cover this now. Please advise and thank you.

## 2019-02-08 NOTE — Telephone Encounter (Signed)
Prescription has been switched back to Symbicort.

## 2019-02-08 NOTE — Telephone Encounter (Signed)
Pt called and said that her ins will cover the symbicort so she would like for it to be called into [facility] on [address]. [phone].

## 2019-02-08 NOTE — Telephone Encounter (Signed)
Yes change back to symbicort 2 puffs twice a day

## 2019-02-12 ENCOUNTER — Ambulatory Visit (INDEPENDENT_AMBULATORY_CARE_PROVIDER_SITE_OTHER): Payer: 59 | Admitting: *Deleted

## 2019-02-12 DIAGNOSIS — J4540 Moderate persistent asthma, uncomplicated: Secondary | ICD-10-CM

## 2019-02-14 ENCOUNTER — Emergency Department (HOSPITAL_COMMUNITY): Payer: 59

## 2019-02-14 ENCOUNTER — Other Ambulatory Visit: Payer: Self-pay

## 2019-02-14 ENCOUNTER — Emergency Department (HOSPITAL_COMMUNITY)
Admission: EM | Admit: 2019-02-14 | Discharge: 2019-02-14 | Discharge status: Against Medical Advice | Payer: 59 | Attending: Emergency Medicine | Admitting: Emergency Medicine

## 2019-02-14 ENCOUNTER — Ambulatory Visit (INDEPENDENT_AMBULATORY_CARE_PROVIDER_SITE_OTHER)
Admission: EM | Admit: 2019-02-14 | Discharge: 2019-02-14 | Disposition: A | Discharge status: Home / Self Care | Payer: 59 | Source: Home / Self Care

## 2019-02-14 ENCOUNTER — Encounter (HOSPITAL_COMMUNITY): Payer: Self-pay | Admitting: Emergency Medicine

## 2019-02-14 DIAGNOSIS — J4540 Moderate persistent asthma, uncomplicated: Secondary | ICD-10-CM

## 2019-02-14 DIAGNOSIS — J9811 Atelectasis: Secondary | ICD-10-CM | POA: Diagnosis not present

## 2019-02-14 DIAGNOSIS — J0100 Acute maxillary sinusitis, unspecified: Secondary | ICD-10-CM

## 2019-02-14 DIAGNOSIS — R0602 Shortness of breath: Secondary | ICD-10-CM | POA: Insufficient documentation

## 2019-02-14 DIAGNOSIS — R05 Cough: Secondary | ICD-10-CM | POA: Diagnosis not present

## 2019-02-14 DIAGNOSIS — Z5321 Procedure and treatment not carried out due to patient leaving prior to being seen by health care provider: Secondary | ICD-10-CM | POA: Diagnosis not present

## 2019-02-14 DIAGNOSIS — R058 Other specified cough: Secondary | ICD-10-CM

## 2019-02-14 DIAGNOSIS — R0789 Other chest pain: Secondary | ICD-10-CM

## 2019-02-14 LAB — BASIC METABOLIC PANEL
Anion gap: 8 (ref 5–15)
BUN: 12 mg/dL (ref 8–23)
CO2: 25 mmol/L (ref 22–32)
Calcium: 9.5 mg/dL (ref 8.9–10.3)
Chloride: 105 mmol/L (ref 98–111)
Creatinine, Ser: 0.65 mg/dL (ref 0.44–1.00)
GFR calc Af Amer: >60 mL/min (ref >60)
GFR calc non Af Amer: >60 mL/min (ref >60)
Glucose, Bld: 119 mg/dL — ABNORMAL HIGH (ref 70–99)
Potassium: 3.7 mmol/L (ref 3.5–5.1)
Sodium: 138 mmol/L (ref 135–145)

## 2019-02-14 LAB — I-STAT TROPONIN, ED: Troponin i, poc: 0.01 ng/mL (ref 0.00–0.08)

## 2019-02-14 LAB — CBC
HCT: 38.7 % (ref 36.0–46.0)
Hemoglobin: 11.6 g/dL — ABNORMAL LOW (ref 12.0–15.0)
MCH: 25.5 pg — ABNORMAL LOW (ref 26.0–34.0)
MCHC: 30 g/dL (ref 30.0–36.0)
MCV: 85.1 fL (ref 80.0–100.0)
Platelets: 256 10*3/uL (ref 150–400)
RBC: 4.55 MIL/uL (ref 3.87–5.11)
RDW: 14.6 % (ref 11.5–15.5)
WBC: 4.7 10*3/uL (ref 4.0–10.5)
nRBC: 0 % (ref 0.0–0.2)

## 2019-02-14 MED ORDER — AMOXICILLIN 875 MG PO TABS: 875.0000 mg | ORAL_TABLET | Freq: Two times a day (BID) | ORAL | 0 refills | Status: DC

## 2019-02-14 MED ORDER — SODIUM CHLORIDE 0.9% FLUSH: 3.0000 mL | Freq: Once | INTRAVENOUS | Status: DC

## 2019-02-14 MED ORDER — METHYLPREDNISOLONE SODIUM SUCC 125 MG IJ SOLR
INTRAMUSCULAR | Status: AC
  Filled 2019-02-14: qty 2

## 2019-02-14 MED ORDER — METHYLPREDNISOLONE SODIUM SUCC 125 MG IJ SOLR
125.0000 mg | Freq: Once | INTRAMUSCULAR | Status: AC
  Administered 2019-02-14: 125 mg via INTRAMUSCULAR

## 2019-02-14 NOTE — ED Notes (Signed)
Patient requesting to be taken out of system - states she has been waiting too long. Labs, x-ray, and vitals stable. No change in acuity indicated at this time. Patient updated on her status, and she states she would still like to leave. Patient ambulatory with steady gait.

## 2019-02-14 NOTE — ED Triage Notes (Signed)
Patient reports waking up and feeling short of breath this morning - thought it was her asthma and allergies, so did neb treatment/inhaler and took allergy medication without relief. Patient also endorses some chest pain that she doesn't get with her asthma. Currently denies CP. Sick with flu 2 weeks ago but states she got better. Resp e/u, skin w/d.

## 2019-02-14 NOTE — ED Provider Notes (Signed)
MRN: [id number] DOB: [birth year]  Subjective:   Chelsea Benson is a 68 y.o. female presenting for 1 week history of worsening moderate-severe productive cough with shortness of breath and wheezing.  Patient is now having left-sided chest pain with her coughing.  Initially she went to the ER and had some point-of-care test done but the wait was too long so she came to our urgent care.  Troponin was negative.  Chest x-ray showed mild bilateral atelectasis.  She has a history of asthma and uses her albuterol nebulizer solution about 4 times daily.  She is also taking her Spiriva.  She is keeping up with her Singulair and allergy medications as well.   Current Facility-Administered Medications:  .  omalizumab Arvid Right) injection 375 mg, 375 mg, Subcutaneous, Q14 Days, Kennith Gain, MD, 375 mg at 02/12/19 1510  Current Outpatient Medications:  .  ACCU-CHEK GUIDE test strip, TEST BID PRN, Disp: , Rfl: 1 .  albuterol (PROVENTIL HFA;VENTOLIN HFA) 108 (90 Base) MCG/ACT inhaler, Inhale 2 puffs into the lungs every 4 (four) hours as needed. For wheezing, Disp: 18 g, Rfl: 1 .  albuterol (PROVENTIL) (2.5 MG/3ML) 0.083% nebulizer solution, Take 3 mLs (2.5 mg total) by nebulization every 4 (four) hours as needed for wheezing or shortness of breath., Disp: 360 mL, Rfl: 1 .  Albuterol Sulfate 108 (90 Base) MCG/ACT AEPB, Inhale 2 puffs into the lungs every 4 (four) hours as needed., Disp: 3 each, Rfl: 1 .  ARIPiprazole (ABILIFY) 2 MG tablet, Take 1 tablet (2 mg total) by mouth daily., Disp: 90 tablet, Rfl: 1 .  aspirin EC 325 MG tablet, Take 1 tablet (325 mg total) by mouth 2 (two) times daily. (Patient taking differently: Take 81 mg by mouth daily. ), Disp: 30 tablet, Rfl: 0 .  azelastine (ASTELIN) 0.1 % nasal spray, Place 1 spray into both nostrils 2 (two) times daily., Disp: 30 mL, Rfl: 4 .  Blood Glucose Monitoring Suppl (ACCU-CHEK GUIDE) w/Device KIT, See admin instructions., Disp: , Rfl: 1 .   CLOBETASOL PROPIONATE E 0.05 % emollient cream, , Disp: , Rfl:  .  cyclobenzaprine (FLEXERIL) 10 MG tablet, Take 10 mg by mouth 2 (two) times daily as needed for muscle spasms., Disp: , Rfl:  .  diltiazem (CARDIZEM CD) 120 MG 24 hr capsule, Take 120 mg by mouth daily. , Disp: , Rfl:  .  EPINEPHrine (EPIPEN 2-PAK) 0.3 mg/0.3 mL IJ SOAJ injection, Use as directed for severe allergic reaction, Disp: 2 Device, Rfl: 1 .  fexofenadine (ALLEGRA) 180 MG tablet, Take 180 mg by mouth daily., Disp: , Rfl:  .  fluticasone (FLONASE) 50 MCG/ACT nasal spray, Use 1-2 sprays in each nostril in each nostril once daily, Disp: 48 g, Rfl: 1 .  glimepiride (AMARYL) 4 MG tablet, TK 1 T PO QD WITH BRE OR THE FIRST MAIN MEAL OF THE DAY, Disp: , Rfl:  .  hydrochlorothiazide (HYDRODIURIL) 25 MG tablet, TAKE 25 MG BY MOUTH DAILY EVERY MORNING, Disp: , Rfl:  .  JANUMET 50-1000 MG tablet, Take 1 tablet by mouth 2 (two) times daily with a meal. , Disp: , Rfl:  .  KLOR-CON M20 20 MEQ tablet, Take 20 mEq by mouth daily. , Disp: , Rfl:  .  LYRICA 100 MG capsule, Take 100 mg by mouth 3 (three) times daily. , Disp: , Rfl:  .  mirtazapine (REMERON) 30 MG tablet, Take 1 tablet (30 mg total) by mouth at bedtime., Disp: 90 tablet, Rfl: 1 .  mometasone-formoterol (DULERA) 200-5 MCG/ACT AERO, Inhale 2 puffs into the lungs 2 (two) times daily., Disp: 1 Inhaler, Rfl: 5 .  montelukast (SINGULAIR) 10 MG tablet, Take 1 tablet (10 mg total) by mouth at bedtime., Disp: 90 tablet, Rfl: 1 .  Olopatadine HCl 0.2 % SOLN, Apply 1 drop to eye daily., Disp: 3 Bottle, Rfl: 1 .  oseltamivir (TAMIFLU) 75 MG capsule, TK 1 C PO BID FOR 5 DAYS, Disp: , Rfl:  .  OXcarbazepine (TRILEPTAL) 150 MG tablet, 2 tabs QAM & 3 tabs QHS, Disp: 450 tablet, Rfl: 1 .  oxybutynin (DITROPAN-XL) 10 MG 24 hr tablet, TAKE 10 MG BY MOUTH DAILY, Disp: , Rfl:  .  oxyCODONE-acetaminophen (PERCOCET) 10-325 MG tablet, Take by mouth., Disp: , Rfl:  .  pravastatin (PRAVACHOL) 20 MG  tablet, Take 20 mg by mouth every evening. , Disp: , Rfl:  .  Respiratory Therapy Supplies (NEBULIZER) DEVI, Use as directed with nebulizer solution., Disp: 1 each, Rfl: 1 .  SPIRIVA HANDIHALER 18 MCG inhalation capsule, PLACE 1 CAPSULE INTO INHALER AND INHALE DAILY, Disp: 30 capsule, Rfl: 3 .  SYMBICORT 160-4.5 MCG/ACT inhaler, Inhale 2 puffs into the lungs 2 (two) times daily., Disp: 30.6 g, Rfl: 0 .  tiotropium (SPIRIVA) 18 MCG inhalation capsule, Place 1 capsule (18 mcg total) into inhaler and inhale daily., Disp: 30 capsule, Rfl: 5 .  UNABLE TO FIND, INL 2 PFS ITL BID, Disp: , Rfl:  .  zolpidem (AMBIEN) 10 MG tablet, Take 1/2-1 tab, Disp: 90 tablet, Rfl: 1    Allergies  Allergen Reactions  . Cozaar Cough  . Crestor [Rosuvastatin Calcium] Other (See Comments)    Makes her heart beat really fast.  . Carvedilol   . Cymbalta [Duloxetine Hcl] Other (See Comments)    Caused hair loss  . Fish Allergy   . Morphine And Related   . Rosuvastatin Calcium     Rapid heart rate  . Almond Meal Itching and Other (See Comments)    Tongue itches  . Almond Oil Itching and Other (See Comments)    Itching of tongue Itching of tongue  . Losartan Potassium     Cough    Past Medical History:  Diagnosis Date  . Allergic rhinoconjunctivitis   . Allergy history unknown   . Anxiety   . Arthritis    DDD, spondylosis  . Asthma   . Chronic back pain    Has had several surgeries.  He uses a walker  . Chronic kidney disease    renal calculi  . Depression   . Diabetes mellitus without complication ([place])    dx in 2007  . Diabetes mellitus, type II ([place])   . Eczema   . Headache(784.0)    sinus related   . History of kidney stones   . Hx of pulmonary embolus 2017  . Hyperlipidemia   . Hypertension   . Insomnia   . Knee pain   . Left ankle pain   . Mental disorder   . Mood disorder ([place])   . OSA on CPAP    PSG 05/24/2016: AHI 17/hour 02 mean 76%.  HSAT 06/13/17, AHI 11-hour 02 mean 79%  .  Recurrent upper respiratory infection (URI)   . Sleep apnea      Past Surgical History:  Procedure Laterality Date  . ABDOMINAL HYSTERECTOMY    . BACK SURGERY     2012- lumbar fusion  . BREAST SURGERY     L cyst removed - 1972  .  BUNIONECTOMY Left    Great toe  . calf -R- cyst removed    . CARPAL TUNNEL RELEASE     both hands   . HAMMER TOE SURGERY Left    L foot  . NASAL SINUS SURGERY    . NM MYOVIEW LTD  12/2008   Normal study.  Normal LV function.  EF 61%.  Apical thinning but no ischemia or infarction.  Marland Kitchen SHOULDER ARTHROSCOPY Left    R shoulder- RCR  . TOE SURGERY    . TOTAL KNEE ARTHROPLASTY Right 05/22/2017   Procedure: TOTAL KNEE ARTHROPLASTY;  Surgeon: Frederik Pear, MD;  Location: [facility];  Service: Orthopedics;  Laterality: Right;  . TRANSTHORACIC ECHOCARDIOGRAM  06/2009    Technically difficult.  Moderate concentric LVH with normal LV function.  No regional wall motion normalities.  EF> 55%.  Normal right ventricle.  No valve lesions.  Normal filling pressures.  Marland Kitchen TRIGGER FINGER RELEASE     L thumb  . TUBAL LIGATION      Review of Systems  Constitutional: Negative for fever and malaise/fatigue.  HENT: Positive for congestion, ear pain (right) and sinus pain. Negative for sore throat.   Eyes: Negative for blurred vision, double vision, discharge and redness.  Respiratory: Positive for cough, shortness of breath and wheezing. Negative for hemoptysis.   Cardiovascular: Positive for chest pain.  Gastrointestinal: Negative for abdominal pain, diarrhea, nausea and vomiting.  Genitourinary: Negative for dysuria, flank pain and hematuria.  Musculoskeletal: Negative for myalgias.  Skin: Negative for rash.  Neurological: Positive for headaches. Negative for weakness.  Psychiatric/Behavioral: Negative for depression and substance abuse.    Objective:   Vitals: Pulse 85   Temp 98.3 F (36.8 C) (Oral)   Resp (!) 22   SpO2 100%   Physical Exam Constitutional:       General: She is not in acute distress.    Appearance: Normal appearance. She is well-developed. She is not ill-appearing, toxic-appearing or diaphoretic.  HENT:     Head: Normocephalic and atraumatic.     Right Ear: Tympanic membrane and ear canal normal. No drainage or tenderness. No middle ear effusion. Tympanic membrane is not erythematous.     Left Ear: Tympanic membrane and ear canal normal. No drainage or tenderness.  No middle ear effusion. Tympanic membrane is not erythematous.     Nose: Nose normal. No congestion or rhinorrhea.     Mouth/Throat:     Mouth: Mucous membranes are moist. No oral lesions.     Pharynx: Oropharynx is clear. No pharyngeal swelling, oropharyngeal exudate, posterior oropharyngeal erythema or uvula swelling.     Tonsils: No tonsillar exudate or tonsillar abscesses.  Eyes:     Extraocular Movements: Extraocular movements intact.     Right eye: Normal extraocular motion.     Left eye: Normal extraocular motion.     Conjunctiva/sclera: Conjunctivae normal.     Pupils: Pupils are equal, round, and reactive to light.  Neck:     Musculoskeletal: Normal range of motion and neck supple.  Cardiovascular:     Rate and Rhythm: Normal rate and regular rhythm.     Pulses: Normal pulses.     Heart sounds: Normal heart sounds. No murmur. No friction rub. No gallop.   Pulmonary:     Effort: Pulmonary effort is normal. No respiratory distress.     Breath sounds: Normal breath sounds. No stridor. No wheezing, rhonchi or rales.  Lymphadenopathy:     Cervical: No cervical adenopathy.  Skin:  General: Skin is warm and dry.     Findings: No rash.  Neurological:     General: No focal deficit present.     Mental Status: She is alert and oriented to person, place, and time.  Psychiatric:        Mood and Affect: Mood normal.        Behavior: Behavior normal.        Thought Content: Thought content normal.     Results for orders placed or performed during the hospital  encounter of 02/14/19 (from the past 24 hour(s))  Basic metabolic panel     Status: Abnormal   Collection Time: 02/14/19 12:52 PM  Result Value Ref Range   Sodium 138 135 - 145 mmol/L   Potassium 3.7 3.5 - 5.1 mmol/L   Chloride 105 98 - 111 mmol/L   CO2 25 22 - 32 mmol/L   Glucose, Bld 119 (H) 70 - 99 mg/dL   BUN 12 8 - 23 mg/dL   Creatinine, Ser 0.65 0.44 - 1.00 mg/dL   Calcium 9.5 8.9 - 10.3 mg/dL   GFR calc non Af Amer >60 >60 mL/min   GFR calc Af Amer >60 >60 mL/min   Anion gap 8 5 - 15  CBC     Status: Abnormal   Collection Time: 02/14/19 12:52 PM  Result Value Ref Range   WBC 4.7 4.0 - 10.5 K/uL   RBC 4.55 3.87 - 5.11 MIL/uL   Hemoglobin 11.6 (L) 12.0 - 15.0 g/dL   HCT 38.7 36.0 - 46.0 %   MCV 85.1 80.0 - 100.0 fL   MCH 25.5 (L) 26.0 - 34.0 pg   MCHC 30.0 30.0 - 36.0 g/dL   RDW 14.6 11.5 - 15.5 %   Platelets 256 150 - 400 K/uL   nRBC 0.0 0.0 - 0.2 %  I-stat troponin, ED     Status: None   Collection Time: 02/14/19  1:18 PM  Result Value Ref Range   Troponin i, poc 0.01 0.00 - 0.08 ng/mL   Comment 3           Dg Chest 2 View  Result Date: 02/14/2019 CLINICAL DATA:  Shortness of breath and chest pain EXAM: CHEST - 2 VIEW COMPARISON:  05/11/2017 FINDINGS: Cardiac shadow is mildly enlarged but stable. Bibasilar atelectatic changes are noted. No sizable infiltrate or focal effusion is noted. Degenerative changes of the thoracic spine are seen. IMPRESSION: Mild bibasilar atelectatic changes Electronically Signed   By: Inez Catalina M.D.   On: 02/14/2019 13:34    Assessment and Plan :   Acute maxillary sinusitis, recurrence not specified  SOB (shortness of breath)  Productive cough  Moderate persistent asthma without complication  Atypical chest pain  Bilateral atelectasis  Start amoxicillin to address sinusitis.  Use IM Solu-Medrol given her bibasilar atelectasis and her history of moderate persistent asthma.  Maintain all medications.  Follow-up with PCP.  Counseled patient on potential for adverse effects with medications prescribed today, patient verbalized understanding. ER and return-to-clinic precautions discussed, patient verbalized understanding.    Jaynee Eagles, PA-C 02/14/19 1731

## 2019-02-14 NOTE — ED Triage Notes (Signed)
Pt presents to [facility] for assessment of difficulty taking a deep breath with left sided chest pain.  No pain at this time.  States she was seen in the ED with blood work and xray done, but did not want to wait for results.  States she has had a cough for 1 week.

## 2019-02-15 ENCOUNTER — Other Ambulatory Visit: Payer: Self-pay

## 2019-02-15 ENCOUNTER — Ambulatory Visit (INDEPENDENT_AMBULATORY_CARE_PROVIDER_SITE_OTHER): Payer: 59 | Admitting: Psychiatry

## 2019-02-15 DIAGNOSIS — F3181 Bipolar II disorder: Secondary | ICD-10-CM

## 2019-02-15 NOTE — Progress Notes (Signed)
      [facility] Counselor/Therapist Progress Note  Patient ID: Chelsea Benson, MRN: [id number],    Date: 02/15/2019  Time Spent: 60 minutes  Treatment Type: Individual Therapy  Reported Symptoms:   Frustration (but has lessened), anxiety, sadness,  Mental Status Exam:  Appearance:   Neat     Behavior:  Appropriate and Sharing  Motor:  Normal  Speech/Language:   Normal Rate  Affect:  frustrated, anxious  Mood:  anxious  Thought process:  normal  Thought content:    WNL  Sensory/Perceptual disturbances:    WNL  Orientation:  oriented to person, place, time/date, situation, day of week, month of year and year  Attention:  Good  Concentration:  Good  Memory:  WNL  Fund of knowledge:   Good  Insight:    Fair  Judgment:   Good  Impulse Control:  Fair   Risk Assessment: Danger to Self:  No Self-injurious Behavior: No Danger to Others: No Duty to Warn:no Physical Aggression / Violence:No  Access to Firearms a concern: No  Gang Involvement:No   Subjective:  Patient in today with symptoms above.  Shares that she's had more sadness lately mostly about missing her close friend in [place], several hours away.  Due to health concerns, neither can make the trip to see each other. Discussed her sad feelings and also how she can use other means of staying connected during this time.  Still looking to find a different home and has some frustration and anxiety about how that process is going.    With her current health issues including asthma and diabetes, and a current sinus infection for which she reports she is taking antibiotics, her anxiety is heightened some with the coronavirus which we processed at length.  Patient more calm after talking through her concerns, and we emphasized "preparation versus panic" regarding virus concerns.  Reviewed strategies to help her manage anxiety as well as some anger/frustration that seems to arise when frustrated with people and situations.   Strategies include breathing exercises, physical movement, and changing her thoughts to lean in a more positive direction.  Also encouraged good self-care and to remain on meds as prescribed.  Interventions: Solution-Focused, Cognitive Behavioral Tx,  Supportive  Diagnosis:   ICD-10-CM   1. Bipolar II disorder (HCC) F31.81     Plan:  Patient to follow through in using the strategies mentioned above, remembering when she gets anxious, angry or frustrated, to be mindful of what is and is not in her control, and really think about what would be the best response for her.   To return within 2-3 wks.   Mathis Fare, LCSW

## 2019-02-20 ENCOUNTER — Telehealth: Payer: Self-pay | Admitting: Allergy

## 2019-02-20 MED ORDER — FLUTICASONE PROPIONATE 50 MCG/ACT NA SUSP: NASAL | 1 refills | Status: DC

## 2019-02-20 NOTE — Telephone Encounter (Signed)
Informed patient that rx has been sent over.  Dr. Delorse Lek is aware of patients condition.

## 2019-02-20 NOTE — Telephone Encounter (Signed)
Called the pt to cancelled her appointment and reschedule and she wanted the dr to know that she had a lung clasps and needs flonases called in to [facility] on [address].

## 2019-02-22 ENCOUNTER — Ambulatory Visit: Payer: Medicaid Other | Admitting: Allergy

## 2019-02-26 ENCOUNTER — Ambulatory Visit: Payer: Self-pay

## 2019-03-01 ENCOUNTER — Ambulatory Visit: Payer: Medicare Other | Admitting: Cardiology

## 2019-03-05 ENCOUNTER — Other Ambulatory Visit: Payer: Self-pay | Admitting: Neurosurgery

## 2019-03-05 DIAGNOSIS — M5440 Lumbago with sciatica, unspecified side: Secondary | ICD-10-CM

## 2019-03-08 ENCOUNTER — Ambulatory Visit: Payer: 59 | Admitting: Psychiatry

## 2019-03-13 ENCOUNTER — Telehealth: Payer: Self-pay

## 2019-03-13 ENCOUNTER — Encounter: Payer: Self-pay | Admitting: Family Medicine

## 2019-03-13 ENCOUNTER — Other Ambulatory Visit: Payer: Self-pay

## 2019-03-13 ENCOUNTER — Ambulatory Visit (INDEPENDENT_AMBULATORY_CARE_PROVIDER_SITE_OTHER): Payer: 59 | Admitting: Family Medicine

## 2019-03-13 DIAGNOSIS — J4541 Moderate persistent asthma with (acute) exacerbation: Secondary | ICD-10-CM | POA: Diagnosis not present

## 2019-03-13 DIAGNOSIS — T7800XD Anaphylactic reaction due to unspecified food, subsequent encounter: Secondary | ICD-10-CM | POA: Diagnosis not present

## 2019-03-13 DIAGNOSIS — J309 Allergic rhinitis, unspecified: Secondary | ICD-10-CM | POA: Insufficient documentation

## 2019-03-13 DIAGNOSIS — H1013 Acute atopic conjunctivitis, bilateral: Secondary | ICD-10-CM

## 2019-03-13 DIAGNOSIS — L2089 Other atopic dermatitis: Secondary | ICD-10-CM

## 2019-03-13 DIAGNOSIS — J3089 Other allergic rhinitis: Secondary | ICD-10-CM

## 2019-03-13 DIAGNOSIS — J4540 Moderate persistent asthma, uncomplicated: Secondary | ICD-10-CM

## 2019-03-13 DIAGNOSIS — T7800XA Anaphylactic reaction due to unspecified food, initial encounter: Secondary | ICD-10-CM | POA: Insufficient documentation

## 2019-03-13 MED ORDER — PREDNISONE 10 MG PO TABS: ORAL_TABLET | ORAL | 0 refills | Status: DC

## 2019-03-13 MED ORDER — OLOPATADINE HCL 0.7 % OP SOLN: 1.0000 [drp] | Freq: Every day | OPHTHALMIC | 5 refills | Status: DC | PRN

## 2019-03-13 MED ORDER — ALBUTEROL SULFATE (2.5 MG/3ML) 0.083% IN NEBU: 2.5000 mg | INHALATION_SOLUTION | RESPIRATORY_TRACT | 1 refills | Status: DC | PRN

## 2019-03-13 NOTE — Progress Notes (Signed)
RE: Chelsea Benson MRN: [id number] DOB: [birth year] Date of Telemedicine Visit: 03/13/2019  Referring provider: [facility] Chelsea Benson* Primary care provider: [facility]  Chief Complaint: Sinusitis (started monday sinus pressure in face, ears stopped up, [place] with nasal congestion and eyes matted.  no fever, no cough)   Telemedicine Follow Up Visit via Telephone: I connected with Chelsea Benson for a follow up on 03/13/19 by telephone and verified that I am speaking with the correct person using two identifiers.   I discussed the limitations, risks, security and privacy concerns of performing an evaluation and management service by telephone and the availability of in person appointments. I also discussed with the patient that there may be a patient responsible charge related to this service. The patient expressed understanding and agreed to proceed.  Patient is at home. Provider is at the office.  Visit start time: 1:50 Visit end time: 2:14  History of Present Illness: She is a 68 y.o. 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 Her previous allergy office visit was on 11/08/2018 with Dr. Nelva Bush. At today's visit, she reports that her asthma and allergic rhinitis have not been well controlled over the last several days. She reports facial pressure under her eyes and bilateral ears, nasal congestion, thick post nasal drainage, and decreased sense of smell and taste for which she is taking Flonase, azelastine nasal spray, montelukast, and Allegra with no relief of symptoms. Asthma is reported as not well controlled with shortness of breath, wheeze, and dry cough that began on Sunday. She is currently using Symbicort 160-2 puffs twice a day, Spiriva 1.25 mcg -2 puffs once a day, montelukast 10 mg once a day, and albuterol once yesterday with mild short term relief of symptoms. She last  received her Xolair injection 02/12/2019 as she is sheltering in place at this time. She reports occular pruritis with no relief with Patanol. Her current medications are listed in the chart.   Assessment and Plan: Andraya is a 68 y.o. female with:    Allergic rhinoconjunctivitis - Continue to use Flonase 2 sprays twice a day at this time until your nasal congestion is improved then decrease down to Flonase 2 sprays once a day.  Use flonase for 1-2 weeks at a time before stopping once symptoms improve. -Continue Astelin nasal spray 2 sprays in each nostril twice a day as needed for nasal drainage/post-nasal drip - Continue saline nasal rinses once a day for nasal symptoms. Use before nasal sprays -Continue Allegra 180 mg daily use -Continue Singulair 10 mg at bedtime - Begin Mucinex [phone] mg twice a day -Begin Pazeo eyedrop 1 drop in each as needed for watery, itchy, red eyes -Continue allergen avoidance measures   Moderate persistent asthma  - Begin prednisone 38m twice a day x 3 days, 20 mg once a day for 1 day, and 10 mg once a day for 1 day -Continue Symbicort 160/2 puffs twice daily -Continue Spiriva 2 puffs daily/take midday -continue singulair as above -have access to albuterol inhaler 2 puffs every 4-6 hours as needed for cough/wheeze/shortness of breath/chest tightness.  May use 15-20 minutes prior to activity.   Monitor frequency of use.   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 - Carry EpiPen at all times.   Flexural atopic dermatitis - Continue moisturizing routine with Aveeno -  Use triamcinolone 0.1% below the face.   Call the clinic if this treatment plan is not working well for you  Follow up in 1 month or sooner if needed.   Return in about 4 weeks (around 04/10/2019), or if symptoms worsen or fail to improve.  Meds ordered this encounter  Medications   predniSONE (DELTASONE) 10  MG tablet    Sig: Take 2 tablets twice a day for 4 days then 2 tablets day for 2 days, then 1 tablet on the 7th day.    Dispense:  15 tablet    Refill:  0   Olopatadine HCl (PAZEO) 0.7 % SOLN    Sig: Place 1 drop into both eyes daily as needed.    Dispense:  1 Bottle    Refill:  5   Lab Orders  No laboratory test(s) ordered today    Medication List:  Current Outpatient Medications  Medication Sig Dispense Refill   ACCU-CHEK GUIDE test strip TEST BID PRN  1   albuterol (PROVENTIL HFA;VENTOLIN HFA) 108 (90 Base) MCG/ACT inhaler Inhale 2 puffs into the lungs every 4 (four) hours as needed. For wheezing 18 g 1   albuterol (PROVENTIL) (2.5 MG/3ML) 0.083% nebulizer solution Take 3 mLs (2.5 mg total) by nebulization every 4 (four) hours as needed for wheezing or shortness of breath. 360 mL 1   aspirin EC 325 MG tablet Take 1 tablet (325 mg total) by mouth 2 (two) times daily. (Patient taking differently: Take 81 mg by mouth daily. ) 30 tablet 0   azelastine (ASTELIN) 0.1 % nasal spray Place 1 spray into both nostrils 2 (two) times daily. 30 mL 4   Blood Glucose Monitoring Suppl (ACCU-CHEK GUIDE) w/Device KIT See admin instructions.  1   CLOBETASOL PROPIONATE E 0.05 % emollient cream      cyclobenzaprine (FLEXERIL) 10 MG tablet Take 10 mg by mouth 2 (two) times daily as needed for muscle spasms.     diltiazem (CARDIZEM CD) 120 MG 24 hr capsule Take 120 mg by mouth daily.      EPINEPHrine (EPIPEN 2-PAK) 0.3 mg/0.3 mL IJ SOAJ injection Use as directed for severe allergic reaction 2 Device 1   fexofenadine (ALLEGRA) 180 MG tablet Take 180 mg by mouth daily.     fluticasone (FLONASE) 50 MCG/ACT nasal spray Use 1-2 sprays in each nostril in each nostril once daily 48 g 1   glimepiride (AMARYL) 4 MG tablet TK 1 T PO QD WITH BRE OR THE FIRST MAIN MEAL OF THE DAY     hydrochlorothiazide (HYDRODIURIL) 25 MG tablet TAKE 25 MG BY MOUTH DAILY EVERY MORNING     JANUMET 50-1000 MG tablet Take  1 tablet by mouth 2 (two) times daily with a meal.      KLOR-CON M20 20 MEQ tablet Take 20 mEq by mouth daily.      LYRICA 100 MG capsule Take 100 mg by mouth 3 (three) times daily.      montelukast (SINGULAIR) 10 MG tablet Take 1 tablet (10 mg total) by mouth at bedtime. 90 tablet 1   Olopatadine HCl 0.2 % SOLN Apply 1 drop to eye daily. 3 Bottle 1   oseltamivir (TAMIFLU) 75 MG capsule TK 1 C PO BID FOR 5 DAYS     OXcarbazepine (TRILEPTAL) 150 MG tablet 2 tabs QAM & 3 tabs QHS 450 tablet 1   oxybutynin (DITROPAN-XL) 10 MG 24 hr tablet TAKE 10 MG BY MOUTH DAILY     oxyCODONE-acetaminophen (PERCOCET)  10-325 MG tablet Take by mouth.     pravastatin (PRAVACHOL) 20 MG tablet Take 20 mg by mouth every evening.      SPIRIVA HANDIHALER 18 MCG inhalation capsule PLACE 1 CAPSULE INTO INHALER AND INHALE DAILY 30 capsule 3   SYMBICORT 160-4.5 MCG/ACT inhaler Inhale 2 puffs into the lungs 2 (two) times daily. 30.6 g 0   tiotropium (SPIRIVA) 18 MCG inhalation capsule Place 1 capsule (18 mcg total) into inhaler and inhale daily. 30 capsule 5   UNABLE TO FIND INL 2 PFS ITL BID     zolpidem (AMBIEN) 10 MG tablet Take 1/2-1 tab 90 tablet 1   ARIPiprazole (ABILIFY) 2 MG tablet Take 1 tablet (2 mg total) by mouth daily. 90 tablet 1   mirtazapine (REMERON) 30 MG tablet Take 1 tablet (30 mg total) by mouth at bedtime. 90 tablet 1   Olopatadine HCl (PAZEO) 0.7 % SOLN Place 1 drop into both eyes daily as needed. 1 Bottle 5   predniSONE (DELTASONE) 10 MG tablet Take 2 tablets twice a day for 4 days then 2 tablets day for 2 days, then 1 tablet on the 7th day. 15 tablet 0   Respiratory Therapy Supplies (NEBULIZER) DEVI Use as directed with nebulizer solution. (Patient not taking: Reported on 03/13/2019) 1 each 1   Current Facility-Administered Medications  Medication Dose Route Frequency Provider Last Rate Last Dose   omalizumab Arvid Right) injection 375 mg  375 mg Subcutaneous Q14 Days Kennith Gain, MD   375 mg at 02/12/19 1510   Allergies: Allergies  Allergen Reactions   Cozaar Cough   Crestor [Rosuvastatin Calcium] Other (See Comments)    Makes her heart beat really fast.   Carvedilol    Cymbalta [Duloxetine Hcl] Other (See Comments)    Caused hair loss   Fish Allergy    Morphine And Related    Rosuvastatin Calcium     Rapid heart rate   Almond Meal Itching and Other (See Comments)    Tongue itches   Almond Oil Itching and Other (See Comments)    Itching of tongue Itching of tongue   Losartan Potassium     Cough   I reviewed her past medical history, social history, family history, and environmental history and no significant changes have been reported from previous visit on 11/08/2018.  Review of Systems  Constitutional: Negative for fever.  HENT: Positive for congestion, ear pain, postnasal drip, rhinorrhea and sinus pressure.   Eyes: Positive for itching.  Respiratory: Positive for cough, shortness of breath and wheezing.   Gastrointestinal: Negative.   Musculoskeletal: Negative.   Skin: Negative.   Neurological: Negative.   Psychiatric/Behavioral: Negative.    Objective: Physical Exam Not obtained as encounter was done via telephone.   Previous notes and tests were reviewed.  I discussed the assessment and treatment plan with the patient. The patient was provided an opportunity to ask questions and all were answered. The patient agreed with the plan and demonstrated an understanding of the instructions.   The patient was advised to call back or seek an in-person evaluation if the symptoms worsen or if the condition fails to improve as anticipated.  I provided 24 minutes of non-face-to-face time during this encounter.  It was my pleasure to participate in [facility] care today. Please feel free to contact me with any questions or concerns.   Sincerely,  Gareth Morgan, FNP

## 2019-03-13 NOTE — Telephone Encounter (Signed)
Patient is calling due to her having allergy issues. She would like a nurse to give her a call.

## 2019-03-13 NOTE — Telephone Encounter (Signed)
Chelsea Benson we added her to your schedule for phone visit to discuss her current symptoms

## 2019-03-13 NOTE — Telephone Encounter (Signed)
Chelsea Benson.    [facility] went to do nebulizer teaching recently and advised pt she could convert her inhaler medications in to nebulized medications that they can mail to the home.  She does not want to leave home with this pandemic.   She has appt with you today.    Lots of pharmacies are doing mail delivery at this time.  I would advise she ask her regular pharmacy if they are doing mail delivery before we change her inhaler medications to a nebulized form.

## 2019-03-13 NOTE — Telephone Encounter (Signed)
Thank you :)

## 2019-03-13 NOTE — Telephone Encounter (Signed)
Spoke with patient and she stated she was having sinus issues started several days ago, with pressure in the eyes, postnasal drip, cough, and ear ache... Patient states she has no fever or chills and contacted her PCP with her issues and they advise her to be evaluated by her Allergist. Patient stated she is not able to come into the office due to her age being 65+ in age so we offered her a tele-visit so that she will be able to talk with our NP about her symptom.

## 2019-03-13 NOTE — Patient Instructions (Addendum)
Allergic rhinoconjunctivitis - Continue to use Flonase 2 sprays twice a day at this time until your nasal congestion is improved then decrease down to Flonase 2 sprays once a day.  Use flonase for 1-2 weeks at a time before stopping once symptoms improve. -Continue Astelin nasal spray 2 sprays in each nostril twice a day as needed for nasal drainage/post-nasal drip - Continue saline nasal rinses once a day for nasal symptoms. Use before nasal sprays -Continue Allegra 180 mg daily use -Continue Singulair 10 mg at bedtime - Begin Mucinex [phone] mg twice a day -Begin Pazeo eyedrop 1 drop in each as needed for watery, itchy, red eyes -Continue allergen avoidance measures   Moderate persistent asthma  - Begin prednisone 20mg  twice a day x 3 days, 20 mg once a day for 1 day, and 10 mg once a day for 1 day -Continue Symbicort 160/2 puffs twice daily -Continue Spiriva 2 puffs daily/take midday -continue singulair as above -have access to albuterol inhaler 2 puffs every 4-6 hours as needed for cough/wheeze/shortness of breath/chest tightness.  May use 15-20 minutes prior to activity.   Monitor frequency of use.   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 - Carry EpiPen at all times.   Flexural atopic dermatitis - Continue moisturizing routine with Aveeno - Use triamcinolone 0.1% below the face.   Call the clinic if this treatment plan is not working well for you  Follow up in 1 month or sooner if needed.

## 2019-03-13 NOTE — Telephone Encounter (Addendum)
Chelsea Benson from [facility] calling.  Ms Hollinger is asking about nebulized form of medications.  [facility] offers Brovana or perforomist  In place of the Symbicort. And budesonide.  Pt is also taking Spiriva, they offer [place] nebulized form.  Pt wants to stay at home because of the pandemic, does not want to have to go to the pharmacy to pick up meds. They can mail these medications to her.  They can do up to 11 refills per year.  Pt is double covered, per Chelsea Benson, it will not cost her anything.  He is not sure about the Spiriva, it might cost more to switch her.   We can send prescription directly into [facility] or we can call Chelsea Benson to set it up.

## 2019-03-14 ENCOUNTER — Telehealth: Payer: Self-pay

## 2019-03-14 NOTE — Telephone Encounter (Signed)
Ambs, Norvel Richards, FNP  [facility]        Can you please call this patient and check on her breathing? Thank you     Pt stated she is doing a lot better.

## 2019-03-14 NOTE — Telephone Encounter (Signed)
Thank you :)

## 2019-03-18 MED ORDER — AMOXICILLIN-POT CLAVULANATE 875-125 MG PO TABS: 1.0000 | ORAL_TABLET | Freq: Two times a day (BID) | ORAL | 0 refills | Status: AC

## 2019-03-18 NOTE — Addendum Note (Signed)
Addended by: Alfonse Spruce on: 03/18/2019 04:58 PM   Modules accepted: Orders

## 2019-03-18 NOTE — Telephone Encounter (Signed)
Patient called back. I told her we are waiting for response from the doctor and we would call her back as soon as we get any information.

## 2019-03-18 NOTE — Telephone Encounter (Signed)
Patient called back this morning to inform us that she is now coughing up yellow phlegm. Still no fever/chills/body aches.

## 2019-03-18 NOTE — Telephone Encounter (Signed)
I have informed the patient 

## 2019-03-18 NOTE — Telephone Encounter (Signed)
Dr. Dellis Anes can you please advise on this as Thurston Hole is out of the office.

## 2019-03-18 NOTE — Telephone Encounter (Signed)
Review note.  I sent in Augmentin twice daily for 10 days.  Please let the patient know.  Malachi Bonds, MD [facility]

## 2019-04-02 ENCOUNTER — Other Ambulatory Visit: Payer: Self-pay

## 2019-04-02 ENCOUNTER — Telehealth: Payer: Self-pay | Admitting: Psychiatry

## 2019-04-02 DIAGNOSIS — F5101 Primary insomnia: Secondary | ICD-10-CM

## 2019-04-02 NOTE — Telephone Encounter (Signed)
Pt requesting refill of her Ambien. Stated pharmacy will not fill. Prescription was for a 40mos supply. Need new prescription sent to [facility] on [address]. If not Ambien will need something else for sleep.

## 2019-04-02 NOTE — Telephone Encounter (Signed)
[facility] filled #90 on 02/14/2019 for Ambien. Too soon to fill.   Grover Canavan can you call her pharmacy to verify she picked up the 90 tablets ?

## 2019-04-02 NOTE — Telephone Encounter (Signed)
Pharmacy states that the Ambien was picked up on 02/14/19 for a 90 day supply. The pharmacist stated that they have explained this to her several times and have her License on file as being the one who picked it up and stated that she does not accept this answer

## 2019-04-10 ENCOUNTER — Telehealth: Payer: Self-pay | Admitting: Allergy

## 2019-04-10 NOTE — Telephone Encounter (Signed)
Patient is complaining of ear pain that is making her have a sore throat.

## 2019-04-10 NOTE — Telephone Encounter (Signed)
Please set up a virtual visit 

## 2019-04-11 ENCOUNTER — Other Ambulatory Visit: Payer: Self-pay

## 2019-04-11 ENCOUNTER — Encounter: Payer: Self-pay | Admitting: Allergy

## 2019-04-11 ENCOUNTER — Ambulatory Visit (INDEPENDENT_AMBULATORY_CARE_PROVIDER_SITE_OTHER): Payer: 59 | Admitting: Allergy

## 2019-04-11 DIAGNOSIS — T7800XD Anaphylactic reaction due to unspecified food, subsequent encounter: Secondary | ICD-10-CM | POA: Diagnosis not present

## 2019-04-11 DIAGNOSIS — H1013 Acute atopic conjunctivitis, bilateral: Secondary | ICD-10-CM | POA: Diagnosis not present

## 2019-04-11 DIAGNOSIS — L2089 Other atopic dermatitis: Secondary | ICD-10-CM

## 2019-04-11 DIAGNOSIS — J3089 Other allergic rhinitis: Secondary | ICD-10-CM

## 2019-04-11 DIAGNOSIS — J4540 Moderate persistent asthma, uncomplicated: Secondary | ICD-10-CM | POA: Diagnosis not present

## 2019-04-11 MED ORDER — AZELASTINE HCL 0.1 % NA SOLN: 1.0000 | Freq: Two times a day (BID) | NASAL | 4 refills | Status: DC

## 2019-04-11 NOTE — Progress Notes (Signed)
Start time:  1400 Finish Time:  1425 Where are you located:  home Do you give [place] permission to bill your insurance:  yes Are you signed up for my chart:  No, sent link  Pt states that her ears are hurting after coming from outside.  Her eyes are also bothering her.  Used some eye drops, thinks it may just be her allergies.  Pt states that she has been coughing day and night, coughing waking her up at night.  Has not been getting her Xolair injections, because she does not have a car to go sit in while waiting for her 30 minutes.

## 2019-04-11 NOTE — Patient Instructions (Addendum)
Allergic rhinoconjunctivitis - Continue to use Flonase 2 sprays daily as needed.  Use flonase for 1-2 weeks at a time before stopping once symptoms improve. - Continue Astelin nasal spray 2 sprays in each nostril twice a day as needed for nasal drainage/post-nasal drip - Continue saline nasal rinses once a day for nasal symptoms. Use before nasal sprays - Continue Allegra 180 mg daily use (may take additional dose if needed to control allergy symptoms) - Continue Singulair 10 mg at bedtime - Continue Pazeo eyedrop 1 drop in each as needed for watery, itchy, red eyes - Continue allergen avoidance measures   Moderate persistent asthma - Continue Symbicort 160 - 2 puffs twice daily - Continue Spiriva 2 puffs daily - take midday - Continue singulair as above - have access to albuterol inhaler 2 puffs every 4-6 hours as needed for cough/wheeze/shortness of breath/chest tightness.  May use 15-20 minutes prior to activity.   Monitor frequency of use.   - Continue Xolair 375 mg injections once every 2 weeks.  We can have her wait in an patient room to wait out her observation time after Xolair administration to decrease exposure to other people in the office.  I have also asked her to see if she has any family members who could administer medication via an auto-injector.  As coronavirus will not be eradicated in the near future we will have to continue to social distance and have less amount of people gathering in the office at particular times thus if she has someone that can administer an auto-injector at home we can look into changing to Reunion or Dupixent which all have autoinjector options at this time.   She will let us know soon if home injection will be an option or not.    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 - Carry EpiPen at all times.   Flexural atopic dermatitis - Continue moisturizing routine with Aveeno - Use triamcinolone 0.1% below the face.     Follow up in  3-4  or sooner if needed.

## 2019-04-11 NOTE — Progress Notes (Signed)
RE: Chelsea Benson MRN: [id number] DOB: 03-Feb-1951 Date of Telemedicine Visit: 04/11/2019  Referring provider: Chipper Benson Family Chelsea Benson* Primary care provider: Kathyrn Lass, MD  Chief Complaint: Asthma; Allergic Rhinitis and Food Intolerance    Telemedicine Follow Up Visit via Telephone: I connected with Chelsea Benson for a follow up on 04/11/19 by telephone and verified that I am speaking with the correct person using two identifiers.   I discussed the limitations, risks, security and privacy concerns of performing an evaluation and management service by telephone and the availability of in person appointments. I also discussed with the patient that there may be a patient responsible charge related to this service. The patient expressed understanding and agreed to proceed.  Patient is at home.  Provider is at the office.  Visit start time: 1400 Visit end time: [place] consent/check in by: Chelsea Benson Medical consent and medical assistant/nurse: Chelsea Benson S  History of Present Illness: She is a 68 y.o. female, who is being followed for . Her previous allergy office visit was on 03/13/19 with Chelsea Benson, [place].   She states she had to take her trash out yesterday and go to the mail box and she hadn't been outside that much lately and after she got back in she developed ear ache and sore throat.  She states her ear hurt so bad that she had to take a percocet.  She states today however the symptoms have subsided thus she thinks it is related to pollen exposure from outdoor activity.  She states she is taking all her allergy medications including both flonase and Astelin, allegra, singulair and pazeo all of which has been helpful.   She states with her asthma she is having more cough however this has been better recently.  She has not had her Xolair since March.  She does not have a car and takes medicaid ride service and does not want to be exposed to other people as she has asthma and DM and  she is high risk of complications if she gets covid.   Thus she has not been back for her Xolair.  She does continue using her symbicort and spiriva and as needed albuterol.  She continues to avoid almonds and has not had any accidental ingestions or need to use her epipen.  She states she does need to use the triamcinolone occasionally for her eczema with good relief of flare.    Assessment and Plan: [name] is a 68 y.o. female with:     Allergic rhinoconjunctivitis - Continue to use Flonase 2 sprays daily as needed.  Use flonase for 1-2 weeks at a time before stopping once symptoms improve. - Continue Astelin nasal spray 2 sprays in each nostril twice a day as needed for nasal drainage/post-nasal drip - Continue saline nasal rinses once a day for nasal symptoms. Use before nasal sprays - Continue Allegra 180 mg daily use (may take additional dose if needed to control allergy symptoms) - Continue Singulair 10 mg at bedtime - Continue [place] eyedrop 1 drop in each as needed for watery, itchy, red eyes - Continue allergen avoidance measures   Moderate persistent asthma - Continue Symbicort 160 - 2 puffs twice daily - Continue Spiriva 2 puffs daily - take midday - Continue singulair as above - have access to albuterol inhaler 2 puffs every 4-6 hours as needed for cough/wheeze/shortness of breath/chest tightness.  May use 15-20 minutes prior to activity.   Monitor frequency of use.   - Continue  Xolair 375 mg injections once every 2 weeks.  We can have her wait in an patient room to wait out her observation time after Xolair administration to decrease exposure to other people in the office.  I have also asked her to see if she has any family members who could administer medication via an auto-injector.  As coronavirus will not be eradicated in the near future we will have to continue to social distance and have less amount of people gathering in the office at particular times thus if she has someone  that can administer a auto-injector at home we can look into changing to [place] or [place] which all have autoinjector options at this time.   She will let us know soon if home injection will be an option or not.    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 - Carry EpiPen at all times.   Flexural atopic dermatitis - Continue moisturizing routine with Aveeno - Use triamcinolone 0.1% below the face.   Follow up in 3-4  or sooner if needed.    Diagnostics: None.  Medication List:  Current Outpatient Medications  Medication Sig Dispense Refill   ACCU-CHEK GUIDE test strip TEST BID PRN  1   albuterol (PROVENTIL HFA;VENTOLIN HFA) 108 (90 Base) MCG/ACT inhaler Inhale 2 puffs into the lungs every 4 (four) hours as needed. For wheezing 18 g 1   albuterol (PROVENTIL) (2.5 MG/3ML) 0.083% nebulizer solution Take 3 mLs (2.5 mg total) by nebulization every 4 (four) hours as needed for wheezing or shortness of breath. 375 mL 1   ARIPiprazole (ABILIFY) 2 MG tablet Take 1 tablet (2 mg total) by mouth daily. 90 tablet 1   aspirin EC 325 MG tablet Take 1 tablet (325 mg total) by mouth 2 (two) times daily. (Patient taking differently: Take 81 mg by mouth daily. ) 30 tablet 0   azelastine (ASTELIN) 0.1 % nasal spray Place 1 spray into both nostrils 2 (two) times daily. 30 mL 4   Blood Glucose Monitoring Suppl (ACCU-CHEK GUIDE) w/Device KIT See admin instructions.  1   CLOBETASOL PROPIONATE E 0.05 % emollient cream      cyclobenzaprine (FLEXERIL) 10 MG tablet Take 10 mg by mouth 2 (two) times daily as needed for muscle spasms.     diltiazem (CARDIZEM CD) 120 MG 24 hr capsule Take 120 mg by mouth daily.      EPINEPHrine (EPIPEN 2-PAK) 0.3 mg/0.3 mL IJ SOAJ injection Use as directed for severe allergic reaction 2 Device 1   fexofenadine (ALLEGRA) 180 MG tablet Take 180 mg by mouth daily.     fluticasone (FLONASE) 50 MCG/ACT nasal spray Use 1-2 sprays in each nostril in each nostril  once daily 48 g 1   glimepiride (AMARYL) 4 MG tablet TK 1 T PO QD WITH BRE OR THE FIRST MAIN MEAL OF THE DAY     hydrochlorothiazide (HYDRODIURIL) 25 MG tablet TAKE 25 MG BY MOUTH DAILY EVERY MORNING     JANUMET 50-1000 MG tablet Take 1 tablet by mouth 2 (two) times daily with a meal.      KLOR-CON M20 20 MEQ tablet Take 20 mEq by mouth daily.      LYRICA 100 MG capsule Take 100 mg by mouth 3 (three) times daily.      mirtazapine (REMERON) 30 MG tablet Take 1 tablet (30 mg total) by mouth at bedtime. 90 tablet 1   montelukast (SINGULAIR) 10 MG tablet Take 1 tablet (10 mg total)  by mouth at bedtime. 90 tablet 1   Olopatadine HCl (PAZEO) 0.7 % SOLN Place 1 drop into both eyes daily as needed. 1 Bottle 5   OXcarbazepine (TRILEPTAL) 150 MG tablet 2 tabs QAM & 3 tabs QHS 450 tablet 1   oxybutynin (DITROPAN-XL) 10 MG 24 hr tablet TAKE 10 MG BY MOUTH DAILY     oxyCODONE-acetaminophen (PERCOCET) 10-325 MG tablet Take by mouth.     pravastatin (PRAVACHOL) 20 MG tablet Take 20 mg by mouth every evening.      Respiratory Therapy Supplies (NEBULIZER) DEVI Use as directed with nebulizer solution. 1 each 1   SPIRIVA HANDIHALER 18 MCG inhalation capsule PLACE 1 CAPSULE INTO INHALER AND INHALE DAILY 30 capsule 3   SYMBICORT 160-4.5 MCG/ACT inhaler Inhale 2 puffs into the lungs 2 (two) times daily. 30.6 g 0   zolpidem (AMBIEN) 10 MG tablet Take 1/2-1 tab 90 tablet 1   Current Facility-Administered Medications  Medication Dose Route Frequency Provider Last Rate Last Dose   omalizumab Arvid Right) injection 375 mg  375 mg Subcutaneous Q14 Days Kennith Gain, MD   375 mg at 02/12/19 1510   Allergies: Allergies  Allergen Reactions   Cozaar Cough   Crestor [Rosuvastatin Calcium] Other (See Comments)    Makes her heart beat really fast.   Carvedilol    Cymbalta [Duloxetine Hcl] Other (See Comments)    Caused hair loss   Fish Allergy    Morphine And Related    Rosuvastatin  Calcium     Rapid heart rate   Almond Meal Itching and Other (See Comments)    Tongue itches   Almond Oil Itching and Other (See Comments)    Itching of tongue Itching of tongue   Losartan Potassium     Cough   I reviewed her past medical history, social history, family history, and environmental history and no significant changes have been reported from previous visit on 03/13/19.  Review of Systems  Constitutional: Negative for chills and fever.  HENT: Positive for ear pain and sore throat. Negative for congestion, ear discharge, hearing loss, postnasal drip, rhinorrhea, sinus pressure, sneezing and trouble swallowing.   Eyes: Positive for itching. Negative for pain and discharge.  Respiratory: Positive for cough and shortness of breath. Negative for chest tightness and wheezing.   Cardiovascular: Negative.   Gastrointestinal: Negative.   Musculoskeletal: Negative for myalgias.  Skin: Negative for rash.  Neurological: Negative for headaches.   Objective: Physical Exam Not obtained as encounter was done via telephone.   Previous notes and tests were reviewed.  I discussed the assessment and treatment plan with the patient. The patient was provided an opportunity to ask questions and all were answered. The patient agreed with the plan and demonstrated an understanding of the instructions.   The patient was advised to call back or seek an in-person evaluation if the symptoms worsen or if the condition fails to improve as anticipated.  I provided 25 minutes of non-face-to-face time during this encounter.  It was my pleasure to participate in [place] care today. Please feel free to contact me with any questions or concerns.   Sincerely,  Daryel Kenneth Charmian Muff, MD

## 2019-04-15 ENCOUNTER — Telehealth: Payer: Self-pay

## 2019-04-15 NOTE — Telephone Encounter (Signed)
Chelsea Benson called [place] on Friday, May 8th. She called and stated she would like to let us know that she spoke with her daughter, and her daughter stated that she would be able to give Chelsea Benson her injections. I verbally let Dr. Delorse Lek know, and Dr. Delorse Lek voiced understanding.

## 2019-04-15 NOTE — Telephone Encounter (Signed)
Patient called and stated that she spoke with her daughters about one of them giving her injections at home and she stated that her daughter who is a nurse agreed to give her the injection at home. Patient is wondering what her next step is to have this happen. She stated that during her televisit 04/11/2019 Dr. Delorse Lek informed her of three different injections that she could be switched to (Dupixent, [place] or [place]) and is wanting to know which injection she would be getting. Patient also stated that she still has the cough and it seems to be getting worse. She wanted to know if she could take some cough medication such as robitussin. Please advise.

## 2019-04-16 ENCOUNTER — Telehealth: Payer: Self-pay | Admitting: *Deleted

## 2019-04-16 NOTE — Telephone Encounter (Signed)
Yes Chelsea Benson is looking into changing to an biologic with autoinjector so it can be administered at home by her daughter.  Her and her daughter will need to come into office for training for the first injection once we have approval.  Chelsea Benson will be in touch with her regarding the change.   For her cough she can try robitussin but believe she would have better benefit from Mucinex DM.

## 2019-04-16 NOTE — Telephone Encounter (Signed)
Per discussion with Dr Delorse Lek reached out to patient to discuss change from Xolair to [facility] autoinjector that patient's daughter can administer in home.  I advised patient I have her approval from Ins. And will go ahead and submit today for change in therapy.  Advised patient that once she receives medication delivered to home she will need to contact [place] [facility] and make appt for her to come into office for instruction on administration and she can bring daughter.  I will be reaching out to her in the next week to make sure of delivery.

## 2019-04-16 NOTE — Telephone Encounter (Signed)
Called and informed patient of Dr. Randell Patient recommendation. She stated she would try the Mucinex DM and see if that helps. She stated that her daughter is a Engineer, civil (consulting) and may not be about to come into the office but she would speak with her in regards to coming in with patient.

## 2019-04-17 NOTE — Telephone Encounter (Signed)
Thank you :)

## 2019-04-19 ENCOUNTER — Other Ambulatory Visit: Payer: Self-pay | Admitting: Psychiatry

## 2019-04-19 ENCOUNTER — Encounter: Payer: Self-pay | Admitting: Psychiatry

## 2019-04-19 ENCOUNTER — Other Ambulatory Visit: Payer: Self-pay

## 2019-04-19 ENCOUNTER — Ambulatory Visit (INDEPENDENT_AMBULATORY_CARE_PROVIDER_SITE_OTHER): Payer: 59 | Admitting: Psychiatry

## 2019-04-19 DIAGNOSIS — F419 Anxiety disorder, unspecified: Secondary | ICD-10-CM

## 2019-04-19 DIAGNOSIS — F3181 Bipolar II disorder: Secondary | ICD-10-CM | POA: Diagnosis not present

## 2019-04-19 DIAGNOSIS — F5101 Primary insomnia: Secondary | ICD-10-CM

## 2019-04-19 MED ORDER — ARIPIPRAZOLE 2 MG PO TABS: 2.0000 mg | ORAL_TABLET | Freq: Every day | ORAL | 1 refills | Status: DC

## 2019-04-19 MED ORDER — MIRTAZAPINE 30 MG PO TABS: 30.0000 mg | ORAL_TABLET | Freq: Every day | ORAL | 1 refills | Status: DC

## 2019-04-19 MED ORDER — OXCARBAZEPINE 300 MG PO TABS: ORAL_TABLET | ORAL | 1 refills | Status: DC

## 2019-04-19 NOTE — Progress Notes (Signed)
Chelsea Benson [id number] 01/09/1951 67 y.o.  Virtual Visit via Telephone Note  I connected with pt on 04/19/19 at 10:15 AM EDT by telephone and verified that I am speaking with the correct person using two identifiers.   I discussed the limitations, risks, security and privacy concerns of performing an evaluation and management service by telephone and the availability of in person appointments. I also discussed with the patient that there may be a patient responsible charge related to this service. The patient expressed understanding and agreed to proceed.   I discussed the assessment and treatment plan with the patient. The patient was provided an opportunity to ask questions and all were answered. The patient agreed with the plan and demonstrated an understanding of the instructions.   The patient was advised to call back or seek an in-person evaluation if the symptoms worsen or if the condition fails to improve as anticipated.  I provided 30 minutes of non-face-to-face time during this encounter.  The patient was located at home.  The provider was located at home.    , PMHNP   Subjective:   Patient ID:  Chelsea Benson is a 67 y.o. (DOB 03/01/1951) female.  Chief Complaint: No chief complaint on file.   HPI Chelsea Benson presents for follow-up of mood, anxiety, and insomnia. She reports,  "I'm doing ok." She reports that she has not been sleeping well. Takes meds at 8-8:30 pm and sleeps for about 3-4 hours and then is unable to return to sleep. Reports that she is unable to take naps. Reports that this has been her pattern since the start of the pandemic.  She reports that she had 2 days of elevated anxiety and tearfulness. She reports that she started crying without apparent trigger. Denies any recent panic attacks. She reports frequent worry, such as wondering when pandemic will be over and her grades.   "I'm tired of staying in the house." She reports  anxiety in response to not being able to leave home. She reports that her laptop stopped working at the end of school and was not able to connect electronically to complete course work. "it feels like everything is not working."   "My mood has been pretty good." Denies irritable or depressed moods. She reports that her appetite varies. She reports that her energy and motivation have been low. She reports poor concentration. She reports that she is enjoying her grandchildren. Denies SI.  Denies impulsive or risky behavior.  Has been in her home since March 17th.She reports that she is self-quarantining due to comorbidities and being high risk for COVID 19.   Reports that Remeron 30 mg po QHS has been most effective dose for her insomnia. Reports that trazodone has not been effective for her in the past. Has never taken Belsomra.   Review of Systems:  Review of Systems  Respiratory: Negative for shortness of breath.   Musculoskeletal: Positive for gait problem.  Allergic/Immunologic: Positive for environmental allergies.  Neurological: Negative for tremors.  Psychiatric/Behavioral:       Please refer to HPI   Reports that she was on an antibiotic x 10 days and was dx'd with a "collapsed lung."   Medications: I have reviewed the patient's current medications.  Current Outpatient Medications  Medication Sig Dispense Refill  . albuterol (PROVENTIL HFA;VENTOLIN HFA) 108 (90 Base) MCG/ACT inhaler Inhale 2 puffs into the lungs every 4 (four) hours as needed. For wheezing 18 g 1  . albuterol (PROVENTIL) (2.5 MG/3ML)   0.083% nebulizer solution Take 3 mLs (2.5 mg total) by nebulization every 4 (four) hours as needed for wheezing or shortness of breath. 375 mL 1  . ARIPiprazole (ABILIFY) 2 MG tablet Take 1 tablet (2 mg total) by mouth daily. 90 tablet 1  . aspirin EC 325 MG tablet Take 1 tablet (325 mg total) by mouth 2 (two) times daily. (Patient taking differently: Take 81 mg by mouth daily. ) 30  tablet 0  . azelastine (ASTELIN) 0.1 % nasal spray Place 1 spray into both nostrils 2 (two) times daily. 30 mL 4  . cyclobenzaprine (FLEXERIL) 10 MG tablet Take 10 mg by mouth 2 (two) times daily as needed for muscle spasms.    Marland Kitchen diltiazem (CARDIZEM CD) 120 MG 24 hr capsule Take 120 mg by mouth daily.     . fexofenadine (ALLEGRA) 180 MG tablet Take 180 mg by mouth daily.    . fluticasone (FLONASE) 50 MCG/ACT nasal spray Use 1-2 sprays in each nostril in each nostril once daily 48 g 1  . glimepiride (AMARYL) 4 MG tablet TK 1 T PO QD WITH BRE OR THE FIRST MAIN MEAL OF THE DAY    . hydrochlorothiazide (HYDRODIURIL) 25 MG tablet TAKE 25 MG BY MOUTH DAILY EVERY MORNING    . JANUMET 50-1000 MG tablet Take 1 tablet by mouth 2 (two) times daily with a meal.     . KLOR-CON M20 20 MEQ tablet Take 20 mEq by mouth daily.     Marland Kitchen LYRICA 100 MG capsule Take 100 mg by mouth 3 (three) times daily.     . mirtazapine (REMERON) 30 MG tablet Take 1 tablet (30 mg total) by mouth at bedtime. 90 tablet 1  . montelukast (SINGULAIR) 10 MG tablet Take 1 tablet (10 mg total) by mouth at bedtime. 90 tablet 1  . Olopatadine HCl (PAZEO) 0.7 % SOLN Place 1 drop into both eyes daily as needed. 1 Bottle 5  . oxybutynin (DITROPAN-XL) 10 MG 24 hr tablet TAKE 10 MG BY MOUTH DAILY    . oxyCODONE-acetaminophen (PERCOCET) 10-325 MG tablet Take by mouth.    . pravastatin (PRAVACHOL) 20 MG tablet Take 20 mg by mouth every evening.     Marland Kitchen SPIRIVA HANDIHALER 18 MCG inhalation capsule PLACE 1 CAPSULE INTO INHALER AND INHALE DAILY 30 capsule 3  . SYMBICORT 160-4.5 MCG/ACT inhaler Inhale 2 puffs into the lungs 2 (two) times daily. 30.6 g 0  . zolpidem (AMBIEN) 10 MG tablet Take 1/2-1 tab 90 tablet 1  . ACCU-CHEK GUIDE test strip TEST BID PRN  1  . Blood Glucose Monitoring Suppl (ACCU-CHEK GUIDE) w/Device KIT See admin instructions.  1  . CLOBETASOL PROPIONATE E 0.05 % emollient cream     . EPINEPHrine (EPIPEN 2-PAK) 0.3 mg/0.3 mL IJ SOAJ  injection Use as directed for severe allergic reaction 2 Device 1  . Oxcarbazepine (TRILEPTAL) 300 MG tablet Take 1 tablet (300 mg total) by mouth every morning AND 2 tablets (600 mg total) at bedtime. Do all this for 30 days. 90 tablet 1  . Respiratory Therapy Supplies (NEBULIZER) DEVI Use as directed with nebulizer solution. 1 each 1   Current Facility-Administered Medications  Medication Dose Route Frequency Provider Last Rate Last Dose  . omalizumab Arvid Right) injection 375 mg  375 mg Subcutaneous Q14 Days Kennith Gain, MD   375 mg at 02/12/19 1510    Medication Side Effects: None  Allergies:  Allergies  Allergen Reactions  . Cozaar Cough  .  Crestor [Rosuvastatin Calcium] Other (See Comments)    Makes her heart beat really fast.  . Carvedilol   . Cymbalta [Duloxetine Hcl] Other (See Comments)    Caused hair loss  . Fish Allergy   . Morphine And Related   . Rosuvastatin Calcium     Rapid heart rate  . Almond Meal Itching and Other (See Comments)    Tongue itches  . Almond Oil Itching and Other (See Comments)    Itching of tongue Itching of tongue  . Losartan Potassium     Cough    Past Medical History:  Diagnosis Date  . Allergic rhinoconjunctivitis   . Allergy history unknown   . Anxiety   . Arthritis    DDD, spondylosis  . Asthma   . Chronic back pain    Has had several surgeries.  He uses a walker  . Chronic kidney disease    renal calculi  . Depression   . Diabetes mellitus without complication ([place])    dx in 2007  . Diabetes mellitus, type II ([place])   . Eczema   . Headache(784.0)    sinus related   . History of kidney stones   . Hx of pulmonary embolus 2017  . Hyperlipidemia   . Hypertension   . Insomnia   . Knee pain   . Left ankle pain   . Mental disorder   . Mood disorder ([place])   . OSA on CPAP    PSG 05/24/2016: AHI 17/hour 02 mean 76%.  HSAT 06/13/17, AHI 11-hour 02 mean 79%  . Recurrent upper respiratory infection (URI)   . Sleep  apnea     Family History  Problem Relation Age of Onset  . Diabetes Sister   . Diabetes Brother   . Hypertension Brother   . Cancer Maternal Grandmother   . Heart failure Maternal Grandmother   . Hypertension Paternal Grandmother   . Asthma Daughter   . Cancer Daughter   . Depression Daughter   . Hypertension Daughter   . Heart failure Maternal Aunt   . Pneumonia Mother   . Diabetes Mother   . Alcoholism Father   . Alcohol abuse Father   . Depression Daughter   . Schizophrenia Grandchild   . Allergies Neg Hx   . Eczema Neg Hx   . Immunodeficiency Neg Hx     Social History   Socioeconomic History  . Marital status: Single    Spouse name: Not on file  . Number of children: 2  . Years of education: Not on file  . Highest education level: Not on file  Occupational History  . Not on file  Social Needs  . Financial resource strain: Not on file  . Food insecurity:    Worry: Not on file    Inability: Not on file  . Transportation needs:    Medical: Not on file    Non-medical: Not on file  Tobacco Use  . Smoking status: Passive Smoke Exposure - Never Smoker  . Smokeless tobacco: Never Used  . Tobacco comment: grandson smokes, but in his car  Substance and Sexual Activity  . Alcohol use: Yes    Comment: rare use  . Drug use: No  . Sexual activity: Not Currently  Lifestyle  . Physical activity:    Days per week: Not on file    Minutes per session: Not on file  . Stress: Not on file  Relationships  . Social connections:    Talks on phone:  Not on file    Gets together: Not on file    Attends religious service: Not on file    Active member of club or organization: Not on file    Attends meetings of clubs or organizations: Not on file    Relationship status: Not on file  . Intimate partner violence:    Fear of current or ex partner: Not on file    Emotionally abused: Not on file    Physically abused: Not on file    Forced sexual activity: Not on file  Other  Topics Concern  . Not on file  Social History Narrative   She is currently single.  Has 2 girls.  Has some college education.  Currently disabled due to chronic back pain and not employed.    Past Medical History, Surgical history, Social history, and Family history were reviewed and updated as appropriate.   Please see review of systems for further details on the patient's review from today.   Objective:   Physical Exam:  There were no vitals taken for this visit.  Physical Exam Neurological:     Mental Status: She is alert and oriented to person, place, and time.     Cranial Nerves: No dysarthria.  Psychiatric:        Attention and Perception: Attention normal.        Mood and Affect: Mood is anxious.        Speech: Speech normal.        Behavior: Behavior is cooperative.        Thought Content: Thought content normal. Thought content is not paranoid or delusional. Thought content does not include homicidal or suicidal ideation. Thought content does not include homicidal or suicidal plan.        Cognition and Memory: Cognition and memory normal.        Judgment: Judgment normal.     Lab Review:     Component Value Date/Time   NA 138 02/14/2019 1252   NA 134 (A) 05/23/2017   K 3.7 02/14/2019 1252   CL 105 02/14/2019 1252   CO2 25 02/14/2019 1252   GLUCOSE 119 (H) 02/14/2019 1252   BUN 12 02/14/2019 1252   BUN 14 05/23/2017   CREATININE 0.65 02/14/2019 1252   CALCIUM 9.5 02/14/2019 1252   GFRNONAA >60 02/14/2019 1252   GFRAA >60 02/14/2019 1252       Component Value Date/Time   WBC 4.7 02/14/2019 1252   RBC 4.55 02/14/2019 1252   HGB 11.6 (L) 02/14/2019 1252   HGB 12.5 11/08/2018 1041   HCT 38.7 02/14/2019 1252   HCT 39.5 11/08/2018 1041   PLT 256 02/14/2019 1252   MCV 85.1 02/14/2019 1252   MCV 81 11/08/2018 1041   MCH 25.5 (L) 02/14/2019 1252   MCHC 30.0 02/14/2019 1252   RDW 14.6 02/14/2019 1252   RDW 14.3 11/08/2018 1041   LYMPHSABS 1.4 11/08/2018  1041   MONOABS 0.8 05/11/2017 0930   EOSABS 0.2 11/08/2018 1041   BASOSABS 0.0 11/08/2018 1041    No results found for: POCLITH, LITHIUM   No results found for: PHENYTOIN, PHENOBARB, VALPROATE, CBMZ   .res Assessment: Plan:   Increase Trileptal to 300 mg q am and 600 mg QHS to improve anxiety and insomnia. Consider Belsomra or further increase in Trileptal if sleep does not improve. Start Ambien 10 mg 1/2-1 tab po QHS prn insomnia. Continue Remeron 30 mg po QHS for depression and insomnia. Continue Abilify 2 mg po   qd for depression. Recommend continuing psychotherapy with Debbie Dowd, LCSW. Patient to follow-up with this provider in 4 weeks or sooner if clinically indicated.  Anxiety disorder, unspecified type - Plan: Oxcarbazepine (TRILEPTAL) 300 MG tablet  Primary insomnia - Plan: Oxcarbazepine (TRILEPTAL) 300 MG tablet, mirtazapine (REMERON) 30 MG tablet  Bipolar II disorder (HCC) - Plan: ARIPiprazole (ABILIFY) 2 MG tablet, mirtazapine (REMERON) 30 MG tablet  Please see After Visit Summary for patient specific instructions.  Future Appointments  Date Time Provider Department Center  04/23/2019  9:00 AM Dowd, Deborah, LCSW [facility] None  05/06/2019 11:15 AM Wert, Michael B, MD [facility] None  05/16/2019 11:00 AM Padgett, Shaylar Patricia, MD [facility] None    No orders of the defined types were placed in this encounter.     ------------------------------- 

## 2019-04-23 ENCOUNTER — Other Ambulatory Visit: Payer: Self-pay

## 2019-04-23 ENCOUNTER — Ambulatory Visit (INDEPENDENT_AMBULATORY_CARE_PROVIDER_SITE_OTHER): Payer: 59 | Admitting: Psychiatry

## 2019-04-23 DIAGNOSIS — F3181 Bipolar II disorder: Secondary | ICD-10-CM

## 2019-04-23 NOTE — Progress Notes (Signed)
      [facility] Counselor/Therapist Progress Note  Patient ID: YOVONNE MALEKI, MRN: [id number],    Date: 04/23/2019  Time Spent: 60 minutes     9:00am to 10:00am  Treatment Type: Individual Therapy  Reported Symptoms:    Frustration, anxiety, irritability, very difficult dealing with the isolation due to coronavirus issues, fearfulness and apprehensive   New medications or health issues since last appt:  Patient reports "none".  Mental Status Exam:  Appearance:   N/A   (telehealth)   Behavior:  Appropriate and Sharing  Motor:  Normal  Speech/Language:   Normal Rate  Affect:  N/A   (telehealth)  Mood:  anxious  Thought process:  normal  Thought content:    WNL  Sensory/Perceptual disturbances:    WNL  Orientation:  oriented to person, place, time/date, situation, day of week, month of year and year  Attention:  Good  Concentration:  Good  Memory:  WNL  Fund of knowledge:   Good  Insight:    Fair  Judgment:   Good  Impulse Control:  Fair   Risk Assessment: Danger to Self:  No Self-injurious Behavior: No Danger to Others: No Duty to Warn:no Physical Aggression / Violence:No  Access to Firearms a concern: No  Gang Involvement:No   Subjective:  Patient has not had appt in several weeks due to being cooped up with coronavirus shutdown. Has been anxious, frustrated. Denies any SI. Is missing her close friend in [place], several hours away and due to health concerns, neither can make the trip to see each other. Discussed  how she can use other means of staying connected during this time.  Patient also freely vented her frustration, sadness, and anxiety about "the whole coronavirus situation" and "I just can't believe it got this bad". Frustrated that her adult grandson and her brother both refuse to wear a mask in the home that all 3 of them share and patient feels this puts her at more risk of contracting the virus. Talked about ways she might approach them and asking  them to respect her needs enough to use a mask.  Because of patient's current health issues including asthma and diabetes, her anxiety is heightened some with the coronavirus which we processed at length.  Patient became more calm after talking through her concerns, and we emphasized "preparation versus panic" regarding virus concerns. Reviewed strategies to help her manage anxiety as well as some anger/frustration that seems to arise when frustrated with people and situations.    Is carefully limiting her exposure during this time. Strategies include breathing exercises, physical movement even as she has to stay in her house a lot more, ocassionally get outside for fresh air, and working on changing her thoughts to lean in a more positive direction. To remain on meds as prescribed.  Interventions: Solution-Focused, Cognitive Behavioral Tx,  Supportive  Diagnosis:   ICD-10-CM   1. Bipolar II disorder (HCC) F31.81     Plan:  Patient to follow through in using the strategies mentioned above, remembering when she gets anxious, angry or frustrated, to be mindful of what is and is not in her control, and really think about what would be the best response for her.   Next appt within 2-3 wks.   Mathis Fare, LCSW

## 2019-04-28 ENCOUNTER — Encounter (HOSPITAL_COMMUNITY): Payer: Self-pay

## 2019-04-28 ENCOUNTER — Emergency Department (HOSPITAL_COMMUNITY): Payer: 59

## 2019-04-28 ENCOUNTER — Other Ambulatory Visit: Payer: Self-pay

## 2019-04-28 ENCOUNTER — Emergency Department (HOSPITAL_COMMUNITY)
Admission: EM | Admit: 2019-04-28 | Discharge: 2019-04-28 | Disposition: A | Discharge status: Home / Self Care | Payer: 59 | Attending: Emergency Medicine | Admitting: Emergency Medicine

## 2019-04-28 DIAGNOSIS — J45909 Unspecified asthma, uncomplicated: Secondary | ICD-10-CM | POA: Insufficient documentation

## 2019-04-28 DIAGNOSIS — W19XXXA Unspecified fall, initial encounter: Secondary | ICD-10-CM

## 2019-04-28 DIAGNOSIS — Y93G3 Activity, cooking and baking: Secondary | ICD-10-CM | POA: Insufficient documentation

## 2019-04-28 DIAGNOSIS — Z79899 Other long term (current) drug therapy: Secondary | ICD-10-CM | POA: Diagnosis not present

## 2019-04-28 DIAGNOSIS — I129 Hypertensive chronic kidney disease with stage 1 through stage 4 chronic kidney disease, or unspecified chronic kidney disease: Secondary | ICD-10-CM | POA: Insufficient documentation

## 2019-04-28 DIAGNOSIS — E1122 Type 2 diabetes mellitus with diabetic chronic kidney disease: Secondary | ICD-10-CM | POA: Diagnosis not present

## 2019-04-28 DIAGNOSIS — Z7722 Contact with and (suspected) exposure to environmental tobacco smoke (acute) (chronic): Secondary | ICD-10-CM | POA: Insufficient documentation

## 2019-04-28 DIAGNOSIS — W01198A Fall on same level from slipping, tripping and stumbling with subsequent striking against other object, initial encounter: Secondary | ICD-10-CM | POA: Diagnosis not present

## 2019-04-28 DIAGNOSIS — N189 Chronic kidney disease, unspecified: Secondary | ICD-10-CM | POA: Insufficient documentation

## 2019-04-28 DIAGNOSIS — S86812A Strain of other muscle(s) and tendon(s) at lower leg level, left leg, initial encounter: Secondary | ICD-10-CM | POA: Diagnosis not present

## 2019-04-28 DIAGNOSIS — Z7982 Long term (current) use of aspirin: Secondary | ICD-10-CM | POA: Diagnosis not present

## 2019-04-28 DIAGNOSIS — Y92000 Kitchen of unspecified non-institutional (private) residence as  the place of occurrence of the external cause: Secondary | ICD-10-CM | POA: Insufficient documentation

## 2019-04-28 DIAGNOSIS — Y999 Unspecified external cause status: Secondary | ICD-10-CM | POA: Diagnosis not present

## 2019-04-28 DIAGNOSIS — Z7984 Long term (current) use of oral hypoglycemic drugs: Secondary | ICD-10-CM | POA: Diagnosis not present

## 2019-04-28 DIAGNOSIS — R0789 Other chest pain: Secondary | ICD-10-CM | POA: Diagnosis not present

## 2019-04-28 DIAGNOSIS — R42 Dizziness and giddiness: Secondary | ICD-10-CM | POA: Diagnosis not present

## 2019-04-28 DIAGNOSIS — S8992XA Unspecified injury of left lower leg, initial encounter: Secondary | ICD-10-CM | POA: Diagnosis present

## 2019-04-28 LAB — COMPREHENSIVE METABOLIC PANEL
ALT: 30 U/L (ref 0–44)
AST: 21 U/L (ref 15–41)
Albumin: 4.2 g/dL (ref 3.5–5.0)
Alkaline Phosphatase: 68 U/L (ref 38–126)
Anion gap: 10 (ref 5–15)
BUN: 15 mg/dL (ref 8–23)
CO2: 25 mmol/L (ref 22–32)
Calcium: 9.5 mg/dL (ref 8.9–10.3)
Chloride: 101 mmol/L (ref 98–111)
Creatinine, Ser: 0.71 mg/dL (ref 0.44–1.00)
GFR calc Af Amer: >60 mL/min (ref >60)
GFR calc non Af Amer: >60 mL/min (ref >60)
Glucose, Bld: 269 mg/dL — ABNORMAL HIGH (ref 70–99)
Potassium: 3.7 mmol/L (ref 3.5–5.1)
Sodium: 136 mmol/L (ref 135–145)
Total Bilirubin: 0.5 mg/dL (ref 0.3–1.2)
Total Protein: 7.3 g/dL (ref 6.5–8.1)

## 2019-04-28 LAB — CBC WITH DIFFERENTIAL/PLATELET
Abs Immature Granulocytes: 0.03 10*3/uL (ref 0.00–0.07)
Basophils Absolute: 0 10*3/uL (ref 0.0–0.1)
Basophils Relative: 1 %
Eosinophils Absolute: 0.3 10*3/uL (ref 0.0–0.5)
Eosinophils Relative: 5 %
HCT: 41.1 % (ref 36.0–46.0)
Hemoglobin: 12.8 g/dL (ref 12.0–15.0)
Immature Granulocytes: 1 %
Lymphocytes Relative: 38 %
Lymphs Abs: 2.1 10*3/uL (ref 0.7–4.0)
MCH: 26 pg (ref 26.0–34.0)
MCHC: 31.1 g/dL (ref 30.0–36.0)
MCV: 83.5 fL (ref 80.0–100.0)
Monocytes Absolute: 0.5 10*3/uL (ref 0.1–1.0)
Monocytes Relative: 10 %
Neutro Abs: 2.5 10*3/uL (ref 1.7–7.7)
Neutrophils Relative %: 45 %
Platelets: 233 10*3/uL (ref 150–400)
RBC: 4.92 MIL/uL (ref 3.87–5.11)
RDW: 14.6 % (ref 11.5–15.5)
WBC: 5.4 10*3/uL (ref 4.0–10.5)
nRBC: 0 % (ref 0.0–0.2)

## 2019-04-28 LAB — CBG MONITORING, ED: Glucose-Capillary: 183 mg/dL — ABNORMAL HIGH (ref 70–99)

## 2019-04-28 LAB — TROPONIN I: Troponin I: <0.03 ng/mL (ref <0.03)

## 2019-04-28 MED ORDER — SODIUM CHLORIDE 0.9 % IV BOLUS
500.0000 mL | Freq: Once | INTRAVENOUS | Status: AC
  Administered 2019-04-28: 500 mL via INTRAVENOUS

## 2019-04-28 MED ORDER — ACETAMINOPHEN 325 MG PO TABS
650.0000 mg | ORAL_TABLET | Freq: Once | ORAL | Status: AC
  Administered 2019-04-28: 19:00:00 650 mg via ORAL
  Filled 2019-04-28: qty 2

## 2019-04-28 NOTE — ED Triage Notes (Signed)
Pt arrives POV for eval of fall w/ dizziness this morning. Pt reports she has been feeling off balance and dizzy x mos, stating that she is due for back surgery. Denies acute dizziness or neurological changes this AM, states that she has been falling frequently. Denies anticoagulant use.

## 2019-04-28 NOTE — ED Notes (Signed)
Pt ambulated to the bathroom w/walker with no assistance from staff. Pt states her leg hurts but is manageable and that she is ready to go home.

## 2019-04-28 NOTE — ED Provider Notes (Signed)
[facility] EMERGENCY DEPARTMENT Provider Note   CSN: [id number] Arrival date & time: 04/28/19  1450    History   Chief Complaint Chief Complaint  Patient presents with  . Fall    HPI Chelsea Benson is a 68 y.o. female.     HPI  68 year old female presents with fall and left leg pain.  She states that around 8 AM she fell.  She was in the kitchen making breakfast and all of a sudden fell backwards.  She thinks she hit her head though she does not have a headache.  She stood up and then fell again.  Both time she did not have dizziness or near syncope or syncope.  She is not sure what made her fall though.  She is been having instances of stumbling and dizziness on and off for several months.  These come and go.  Yesterday she felt like she hit the wall without any obvious cause.  Currently no dizziness today.  After the injury she went and laid down on the bed and fell asleep for 4 hours.  She is having pain in her posterior left calf.  No blurry vision, neck pain.  She has been having on and off chest pain for 2 weeks that she describes as bilateral clavicle pain.  However the last time she had this pain was 2 days ago.  Past Medical History:  Diagnosis Date  . Allergic rhinoconjunctivitis   . Allergy history unknown   . Anxiety   . Arthritis    DDD, spondylosis  . Asthma   . Chronic back pain    Has had several surgeries.  He uses a walker  . Chronic kidney disease    renal calculi  . Depression   . Diabetes mellitus without complication ([place])    dx in 2007  . Diabetes mellitus, type II ([place])   . Eczema   . Headache(784.0)    sinus related   . History of kidney stones   . Hx of pulmonary embolus 2017  . Hyperlipidemia   . Hypertension   . Insomnia   . Knee pain   . Left ankle pain   . Mental disorder   . Mood disorder ([place])   . OSA on CPAP    PSG 05/24/2016: AHI 17/hour 02 mean 76%.  HSAT 06/13/17, AHI 11-hour 02 mean 79%  . Recurrent upper  respiratory infection (URI)   . Sleep apnea     Patient Active Problem List   Diagnosis Date Noted  . Moderate persistent asthma with acute exacerbation 03/13/2019  . Allergic rhinitis due to allergen 03/13/2019  . Allergic conjunctivitis of both eyes 03/13/2019  . 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  . Anxiety hyperventilation 10/03/2018  . Bipolar II disorder ([place]) 09/18/2018  . Cardiac risk counseling 01/07/2018  . Influenza 10/19/2017  . Cough 10/19/2017  . Allergy to almonds 10/19/2017  . Flexural atopic dermatitis 10/19/2017  . Allergic rhinoconjunctivitis 10/19/2017  . Moderate persistent asthma, uncomplicated 15/17/6160  . S/P arthroscopy of right knee 06/04/2017  . Acute blood loss as cause of postoperative anemia 06/04/2017  . Mood disorder ([place])   . Allergy history unknown   . Anxiety 05/24/2017  . Diabetes mellitus ([place]) 05/24/2017  . Hyperlipidemia 05/24/2017  . Insomnia 05/24/2017  . Primary osteoarthritis of right knee 05/22/2017  . Osteoarthritis of right knee 05/18/2017  . Allergic rhinitis due to pollen 10/06/2014  . Arthritis 10/06/2014  . Asthma 10/06/2014  .  Cubital tunnel syndrome, right 10/06/2014  . Headache 10/06/2014  . Hypertension 10/06/2014  . Depression 08/07/2014  . Postlaminectomy syndrome, lumbar region 09/06/2013  . DDD (degenerative disc disease), lumbosacral 05/11/2013  . Spinal stenosis of lumbar region 05/11/2013    Past Surgical History:  Procedure Laterality Date  . ABDOMINAL HYSTERECTOMY    . BACK SURGERY     2012- lumbar fusion  . BREAST SURGERY     L cyst removed - 1972  . BUNIONECTOMY Left    Great toe  . calf -R- cyst removed    . CARPAL TUNNEL RELEASE     both hands   . HAMMER TOE SURGERY Left    L foot  . NASAL SINUS SURGERY    . NM MYOVIEW LTD  12/2008   Normal study.  Normal LV function.  EF 61%.  Apical thinning but no ischemia or infarction.  Marland Kitchen SHOULDER ARTHROSCOPY Left    R shoulder-  RCR  . TOE SURGERY    . TOTAL KNEE ARTHROPLASTY Right 05/22/2017   Procedure: TOTAL KNEE ARTHROPLASTY;  Surgeon: Frederik Pear, MD;  Location: [facility];  Service: Orthopedics;  Laterality: Right;  . TRANSTHORACIC ECHOCARDIOGRAM  06/2009    Technically difficult.  Moderate concentric LVH with normal LV function.  No regional wall motion normalities.  EF> 55%.  Normal right ventricle.  No valve lesions.  Normal filling pressures.  Marland Kitchen TRIGGER FINGER RELEASE     L thumb  . TUBAL LIGATION       OB History   No obstetric history on file.      Home Medications    Prior to Admission medications   Medication Sig Start Date End Date Taking? Authorizing Provider  albuterol (PROVENTIL HFA;VENTOLIN HFA) 108 (90 Base) MCG/ACT inhaler Inhale 2 puffs into the lungs every 4 (four) hours as needed. For wheezing Patient taking differently: Inhale 2 puffs into the lungs every 4 (four) hours as needed for wheezing.  07/12/18  Yes Padgett, Rae Halsted, MD  albuterol (PROVENTIL) (2.5 MG/3ML) 0.083% nebulizer solution Take 3 mLs (2.5 mg total) by nebulization every 4 (four) hours as needed for wheezing or shortness of breath. 03/13/19  Yes Padgett, Rae Halsted, MD  ARIPiprazole (ABILIFY) 2 MG tablet Take 1 tablet (2 mg total) by mouth daily. 04/19/19 07/18/19 Yes Thayer Headings, PMHNP  aspirin EC 81 MG tablet Take 81 mg by mouth daily.   Yes [provider]  azelastine (ASTELIN) 0.1 % nasal spray Place 1 spray into both nostrils 2 (two) times daily. 04/11/19  Yes Padgett, Rae Halsted, MD  busPIRone (BUSPAR) 15 MG tablet Take 15 mg by mouth 3 (three) times daily. 04/01/19  Yes [provider]  CLOBETASOL PROPIONATE E 0.05 % emollient cream Apply 1 application topically 2 (two) times daily as needed (rash).  01/22/19  Yes [provider]  cyclobenzaprine (FLEXERIL) 10 MG tablet Take 10 mg by mouth 2 (two) times a day.    Yes [provider]  diltiazem (CARDIZEM CD) 120 MG 24  hr capsule Take 120 mg by mouth daily.  03/03/17  Yes [provider]  EPINEPHrine (EPIPEN 2-PAK) 0.3 mg/0.3 mL IJ SOAJ injection Use as directed for severe allergic reaction Patient taking differently: Inject 0.3 mg into the muscle once as needed for anaphylaxis (severe allergic reaction).  07/12/18  Yes Padgett, Rae Halsted, MD  fexofenadine (ALLEGRA) 180 MG tablet Take 180 mg by mouth daily.   Yes [provider]  fluticasone (FLONASE) 50 MCG/ACT nasal  spray Use 1-2 sprays in each nostril in each nostril once daily Patient taking differently: Place 1 spray into both nostrils 2 (two) times a day.  02/20/19  Yes Padgett, Rae Halsted, MD  glimepiride (AMARYL) 2 MG tablet Take 2 mg by mouth daily with breakfast.  01/07/19  Yes [provider]  hydrochlorothiazide (HYDRODIURIL) 25 MG tablet Take 25 mg by mouth daily.  05/06/14  Yes [provider]  Mepolizumab (NUCALA) 100 MG/ML SOAJ Inject 100 mg into the skin every 30 (thirty) days.   Yes [provider]  mirtazapine (REMERON) 30 MG tablet Take 1 tablet (30 mg total) by mouth at bedtime. 04/19/19 07/18/19 Yes Thayer Headings, PMHNP  montelukast (SINGULAIR) 10 MG tablet Take 1 tablet (10 mg total) by mouth at bedtime. 11/08/18  Yes Padgett, Rae Halsted, MD  Olopatadine HCl (PAZEO) 0.7 % SOLN Place 1 drop into both eyes daily as needed. Patient taking differently: Place 1 drop into both eyes daily as needed (seasonal allergies).  03/13/19  Yes Ambs, Kathrine Cords, FNP  Oxcarbazepine (TRILEPTAL) 300 MG tablet Take 1 tablet (300 mg total) by mouth every morning AND 2 tablets (600 mg total) at bedtime. Do all this for 30 days. 04/19/19 05/19/19 Yes Thayer Headings, PMHNP  oxybutynin (DITROPAN-XL) 10 MG 24 hr tablet Take 10 mg by mouth daily.  05/08/14  Yes [provider]  oxyCODONE-acetaminophen (PERCOCET) 10-325 MG tablet Take 1 tablet by mouth 2 (two) times daily as needed for pain.    Yes [provider]  potassium chloride SA (K-DUR) 20 MEQ tablet Take 20 mEq by mouth daily.   Yes [provider]  pravastatin (PRAVACHOL) 20 MG tablet Take 20 mg by mouth at bedtime.  02/03/17  Yes [provider]  pregabalin (LYRICA) 100 MG capsule Take 100 mg by mouth 3 (three) times daily.   Yes [provider]  sitaGLIPtin-metformin (JANUMET) 50-1000 MG tablet Take 1 tablet by mouth 2 (two) times daily with a meal.   Yes [provider]  SPIRIVA HANDIHALER 18 MCG inhalation capsule PLACE 1 CAPSULE INTO INHALER AND INHALE DAILY Patient taking differently: Place 18 mcg into inhaler and inhale daily at 12 noon.  12/06/18  Yes Padgett, Rae Halsted, MD  SYMBICORT 160-4.5 MCG/ACT inhaler Inhale 2 puffs into the lungs 2 (two) times daily. 02/08/19  Yes Padgett, Rae Halsted, MD  zolpidem (AMBIEN) 10 MG tablet Take 1/2-1 tab Patient taking differently: Take 10 mg by mouth at bedtime as needed for sleep.  11/15/18  Yes Thayer Headings, PMHNP  ACCU-CHEK GUIDE test strip TEST BID PRN 09/17/18   [provider]  aspirin EC 325 MG tablet Take 1 tablet (325 mg total) by mouth 2 (two) times daily. Patient not taking: Reported on 04/28/2019 05/24/17   Frederik Pear, MD  Blood Glucose Monitoring Suppl (ACCU-CHEK GUIDE) w/Device KIT See admin instructions. 09/18/18   [provider]  Respiratory Therapy Supplies (NEBULIZER) DEVI Use as directed with nebulizer solution. 06/01/18   Valentina Shaggy, MD    Family History Family History  Problem Relation Age of Onset  . Diabetes Sister   . Diabetes Brother   . Hypertension Brother   . Cancer Maternal Grandmother   . Heart failure Maternal Grandmother   . Hypertension Paternal Grandmother   . Asthma Daughter   . Cancer Daughter   . Depression Daughter   . Hypertension Daughter   . Heart failure Maternal Aunt   . Pneumonia Mother   . Diabetes  Mother   . Alcoholism Father   . Alcohol abuse Father    . Depression Daughter   . Schizophrenia Grandchild   . Allergies Neg Hx   . Eczema Neg Hx   . Immunodeficiency Neg Hx     Social History Social History   Tobacco Use  . Smoking status: Passive Smoke Exposure - Never Smoker  . Smokeless tobacco: Never Used  . Tobacco comment: grandson smokes, but in his car  Substance Use Topics  . Alcohol use: Yes    Comment: rare use  . Drug use: No     Allergies   Cozaar; Crestor [rosuvastatin calcium]; Carvedilol; Cymbalta [duloxetine hcl]; Fish allergy; Morphine and related; Rosuvastatin calcium; Almond meal; Almond oil; and Losartan potassium   Review of Systems Review of Systems  Eyes: Negative for visual disturbance.  Respiratory: Positive for cough (chronic for over 1 month - has been on antibiotics). Negative for shortness of breath.   Cardiovascular: Positive for chest pain.  Gastrointestinal: Negative for abdominal pain and vomiting.  Musculoskeletal: Positive for myalgias. Negative for neck pain.  Neurological: Positive for dizziness and weakness. Negative for headaches.  All other systems reviewed and are negative.    Physical Exam Updated Vital Signs BP (!) 176/104   Pulse 87   Temp 98.9 F (37.2 C) (Oral)   Resp 19   Ht _0  (1.626 m)   Wt 87.1 kg   SpO2 98%   BMI 32.96 kg/m   Physical Exam Vitals signs and nursing note reviewed.  Constitutional:      General: She is not in acute distress.    Appearance: She is well-developed. She is obese. She is not ill-appearing or diaphoretic.  HENT:     Head: Normocephalic and atraumatic.     Right Ear: External ear normal.     Left Ear: External ear normal.     Nose: Nose normal.  Eyes:     General:        Right eye: No discharge.        Left eye: No discharge.  Cardiovascular:     Rate and Rhythm: Normal rate and regular rhythm.     Pulses:          Dorsalis pedis pulses are 2+ on the right side and 2+ on the left side.     Heart sounds: Normal heart sounds.   Pulmonary:     Effort: Pulmonary effort is normal.     Breath sounds: Normal breath sounds.  Abdominal:     Palpations: Abdomen is soft.     Tenderness: There is no abdominal tenderness.  Musculoskeletal:     Left hip: She exhibits normal range of motion and no tenderness.     Left knee: She exhibits normal range of motion. No tenderness found.     Left ankle: She exhibits normal range of motion. No tenderness.     Left upper leg: She exhibits no tenderness and no swelling.     Left lower leg: She exhibits tenderness (mild posterior, hard to localize). She exhibits no swelling.     Left foot: Normal range of motion. No tenderness or swelling.  Skin:    General: Skin is warm and dry.  Neurological:     Mental Status: She is alert.     Comments: CN 3-12 grossly intact. 5/5 strength in all 4 extremities though slightly limited in LLE due to pain. Grossly normal sensation. Normal finger to nose.   Psychiatric:  Mood and Affect: Mood is not anxious.      ED Treatments / Results  Labs (all labs ordered are listed, but only abnormal results are displayed) Labs Reviewed  COMPREHENSIVE METABOLIC PANEL - Abnormal; Notable for the following components:      Result Value   Glucose, Bld 269 (*)    All other components within normal limits  CBG MONITORING, ED - Abnormal; Notable for the following components:   Glucose-Capillary 183 (*)    All other components within normal limits  CBC WITH DIFFERENTIAL/PLATELET  TROPONIN I    EKG EKG Interpretation  Date/Time:  Sunday Apr 28 2019 17:08:33 EDT Ventricular Rate:  96 PR Interval:    QRS Duration: 84 QT Interval:  371 QTC Calculation: 469 R Axis:   -11 Text Interpretation:  Sinus rhythm Low voltage, precordial leads noa cute ST/T changes no significant change since Feb 14 2019 Confirmed by Sherwood Gambler [phone]) on 04/28/2019 6:41:49 PM   Radiology Dg Chest 2 View  Result Date: 04/28/2019 CLINICAL DATA:  Fall, pain. EXAM:  CHEST - 2 VIEW COMPARISON:  Chest x-rays dated 02/14/2019 and 05/11/2017. FINDINGS: Heart size and mediastinal contours are not significantly changed given the semi upright positioning. Lungs are clear. No pleural effusion or pneumothorax seen. No acute appearing osseous abnormality. Stable kyphosis of the thoracic spine. IMPRESSION: No acute findings.  Lungs are clear. Electronically Signed   By: Franki Cabot M.D.   On: 04/28/2019 18:22   Dg Tibia/fibula Left  Result Date: 04/28/2019 CLINICAL DATA:  Fall, calf pain. EXAM: LEFT TIBIA AND FIBULA - 2 VIEW COMPARISON:  None. FINDINGS: There is no evidence of fracture or other focal bone lesions. Soft tissues are unremarkable. IMPRESSION: Negative. Electronically Signed   By: Franki Cabot M.D.   On: 04/28/2019 18:23   Ct Head Wo Contrast  Result Date: 04/28/2019 CLINICAL DATA:  Fall with dizziness. EXAM: CT HEAD WITHOUT CONTRAST TECHNIQUE: Contiguous axial images were obtained from the base of the skull through the vertex without intravenous contrast. COMPARISON:  None. FINDINGS: Brain: There is no evidence for acute hemorrhage, hydrocephalus, mass lesion, or abnormal extra-axial fluid collection. No definite CT evidence for acute infarction. Diffuse loss of parenchymal volume is consistent with atrophy. Vascular: No hyperdense vessel or unexpected calcification. Skull: No evidence for fracture. No worrisome lytic or sclerotic lesion. Sinuses/Orbits: The visualized paranasal sinuses and mastoid air cells are clear. Visualized portions of the globes and intraorbital fat are unremarkable. Other: None. IMPRESSION: 1. No acute intracranial abnormality. 2. Atrophy. Electronically Signed   By: Misty Stanley M.D.   On: 04/28/2019 17:46   Dg Knee Complete 4 Views Left  Result Date: 04/28/2019 CLINICAL DATA:  [place] backwards while standing at kitchen sink today. Left calf pain. EXAM: LEFT KNEE - COMPLETE 4+ VIEW COMPARISON:  None. FINDINGS: No acute fracture,  dislocation, or knee joint effusion is identified. Medial and lateral compartment chondrocalcinosis is noted. There is minimal medial compartment joint space narrowing. IMPRESSION: No acute osseous abnormality identified. Electronically Signed   By: Logan Bores M.D.   On: 04/28/2019 18:23    Procedures Procedures (including critical care time)  Medications Ordered in ED Medications  sodium chloride 0.9 % bolus 500 mL (0 mLs Intravenous Stopped 04/28/19 2032)  acetaminophen (TYLENOL) tablet 650 mg (650 mg Oral Given 04/28/19 1847)     Initial Impression / Assessment and Plan / ED Course  I have reviewed the triage vital signs and the nursing notes.  Pertinent labs &  imaging results that were available during my care of the patient were reviewed by me and considered in my medical decision making (see chart for details).  Clinical Course as of Apr 28 10  Sun Apr 28, 2019  1712 I was called to the bedside because patient was complaining that her left leg was "paralyzed".  On reexamination, she continues to have pain in her left calf and it seems that due to this she has a hard time lifting it.  However initially she seemed to not be able to move it but once I started lifting it up she would gradually resisted falling down.  She then spontaneously moved it and appears to have equal strength compared to the right lower extremity.  I think her complaints are more pain related and do not think this is an acute neurologic problem.   [SG]    Clinical Course User Index [SG] Sherwood Gambler, MD       Patient transiently complained that her left leg was numb and she could not move it but this was easily moving on examination.  I think a lot of this was pain related as she seemed to be resisting during certain strength testing.  She never had flaccid leg or objective weakness.  X-rays are benign.  Given she hit her head, CT obtained but is negative.  Labs are overall reassuring.  Unclear why she is  had dizziness for months or the atypical chest pain for weeks, but she appears stable for outpatient work-up with her PCP.  Return precautions.  She ambulated well without difficulty here.  Final Clinical Impressions(s) / ED Diagnoses   Final diagnoses:  Fall, initial encounter  Strain of calf muscle, left, initial encounter    ED Discharge Orders    None       Sherwood Gambler, MD 04/29/19 [phone]

## 2019-05-01 ENCOUNTER — Telehealth: Payer: Self-pay | Admitting: *Deleted

## 2019-05-01 NOTE — Telephone Encounter (Signed)
Per [facility] patient was due to receive her Nucala autoinjector on 5/22.  I L/M for patient to call to make appt to start therapy and receive instruction on autoinjector administration or she can bring same to her upcoming appt with Dr Delorse Lek on 6/5.  If she has any questions or has not received drug I advised her to reach back out to me

## 2019-05-03 ENCOUNTER — Ambulatory Visit: Payer: 59 | Admitting: Podiatry

## 2019-05-05 ENCOUNTER — Other Ambulatory Visit: Payer: Self-pay | Admitting: Psychiatry

## 2019-05-05 DIAGNOSIS — F419 Anxiety disorder, unspecified: Secondary | ICD-10-CM

## 2019-05-06 ENCOUNTER — Institutional Professional Consult (permissible substitution): Payer: 59 | Admitting: Internal Medicine

## 2019-05-06 ENCOUNTER — Telehealth: Payer: Self-pay | Admitting: Diagnostic Neuroimaging

## 2019-05-06 NOTE — Telephone Encounter (Signed)
I don't see this listed but assumed its probably okay?

## 2019-05-06 NOTE — Telephone Encounter (Signed)
°  Due to current COVID 19 pandemic, our office is severely reducing in office visits until further notice, in order to minimize the risk to our patients and healthcare providers.    Called patient and scheduled a virtual visit with Dr. Marjory Lies for 6/8. Patient verbalized understanding of the doxy.me process and I have sent an e-mail to [email or web address] with link and instructions as well as my name and our office number. Patient understands that they will receive a call from RN to update chart.   Pt understands that although there may be some limitations with this type of visit, we will take all precautions to reduce any security or privacy concerns.  Pt understands that this will be treated like an in office visit and we will file with pt's insurance, and there may be a patient responsible charge related to this service.

## 2019-05-08 ENCOUNTER — Institutional Professional Consult (permissible substitution): Payer: 59 | Admitting: Internal Medicine

## 2019-05-09 ENCOUNTER — Other Ambulatory Visit: Payer: Self-pay

## 2019-05-09 ENCOUNTER — Ambulatory Visit (INDEPENDENT_AMBULATORY_CARE_PROVIDER_SITE_OTHER): Payer: 59 | Admitting: Psychiatry

## 2019-05-09 DIAGNOSIS — F3181 Bipolar II disorder: Secondary | ICD-10-CM

## 2019-05-09 NOTE — Telephone Encounter (Signed)
Spoke with patient and requested to update EMR. She stated she is currently at a dr's appointment. I advised I will call back later. She verbalized understanding, appreciation.

## 2019-05-09 NOTE — Progress Notes (Signed)
      [facility] Counselor/Therapist Progress Note  Patient ID: Chelsea Benson, MRN: [id number],    Date: 05/09/2019  Time Spent: 60 minutes     9:00am to 10:00am  Treatment Type: Individual Therapy  Reported Symptoms:    Frustration, anxiety, irritability   New medications or health issues since last appt:  Patient reports "none" for med changes but did have a fall in her kitchen since last appt.  Treated at ED and released.  To see neurologist this coming week per patient.  Mental Status Exam:  Appearance:   casual  Behavior:  Appropriate and Sharing  Motor:  Normal  Speech/Language:   Normal Rate  Affect:  anxious  Mood:  anxious  Thought process:  normal  Thought content:    WNL  Sensory/Perceptual disturbances:    WNL  Orientation:  oriented to person, place, time/date, situation, day of week, month of year and year  Attention:  Good  Concentration:  Good  Memory:  WNL  Fund of knowledge:   Good  Insight:    Fair  Judgment:   Good  Impulse Control:  Fair   Risk Assessment: Danger to Self:  No Self-injurious Behavior: No Danger to Others: No Duty to Warn:no Physical Aggression / Violence:No  Access to Firearms a concern: No  Gang Involvement:No   Subjective:  Patent in office today reporting symptoms noted above.  Has been more anxious, frustrated, and stressed.  Concerned with the ongoing pandemic, as well as the racial unrest throughout the country.  Contacts with other people is less due to virus concerns and we reviewed how she can use other means of staying connected during this time.  Patient vented her frustration, some anger, fears, and anxiety about "the whole coronavirus situation", racial tensions "and protesting", and her own health concerns (asthma, recent fall, diabetes).  States she fell in her kitchen recently, went to hospital, and has appt with neurologist coming up within 1 week.  CT at ED was reportedly benign.   Still frustrated that her  adult grandson and her brother both refuse to wear a mask in the home that all 3 of them share and patient feels this puts her at more risk of contracting the virus. Talked about ways she might approach them again and ask them to respect her needs enough to use a mask.    Patient became much more calm today after talking through her concerns, and we again emphasized "preparation versus panic" regarding virus concerns. Reviewed strategies to help her manage anxiety as well as some anger/frustration that seems to arise when frustrated with people and situations. Is working on letting go of stress.  To remain on meds as prescribed.  Interventions: Solution-Focused, Cognitive Behavioral Tx,  Supportive  Diagnosis:   ICD-10-CM   1. Bipolar II disorder (HCC) F31.81     Plan:  Patient to follow through in using the strategies discussed today, remembering when she gets anxious, angry or frustrated, to be mindful of what is and is not in her control, and really think about what would be the best response for her.   Next appt within 2-3 wks.   Mathis Fare, LCSW

## 2019-05-09 NOTE — Telephone Encounter (Signed)
Attempted to reach patient to update EMR, no answer, couldn't LVM.

## 2019-05-10 ENCOUNTER — Ambulatory Visit: Payer: 59 | Admitting: Allergy

## 2019-05-10 ENCOUNTER — Ambulatory Visit: Payer: Self-pay

## 2019-05-10 DIAGNOSIS — J4540 Moderate persistent asthma, uncomplicated: Secondary | ICD-10-CM

## 2019-05-10 NOTE — Progress Notes (Signed)
Immunotherapy   Patient Details  Name: Chelsea Benson MRN: [id number] Date of Birth: [birth year]  05/10/2019  [name] started injections for  Nucala autoinjector Frequency: every 4 weeks Epi-Pen:Epi-Pen Available  Consent signed and patient instructions given. Patient did wait 30 minutes post injection with no local or systemic reactions.  Patient's daughter, who is an LPN, was instructed on how to administer the [place] auto-injector. She demonstrated and verbalized understanding of the device. Epipen on hand in home.  LOT-BU7Y EXP-05-05-2020  [name] 05/10/2019, 3:58 PM

## 2019-05-13 ENCOUNTER — Telehealth: Payer: Self-pay | Admitting: *Deleted

## 2019-05-13 ENCOUNTER — Ambulatory Visit: Payer: Self-pay | Admitting: Diagnostic Neuroimaging

## 2019-05-13 NOTE — Telephone Encounter (Signed)
Received message from B Spell this morning: patient called and cancelled her appt today due to illness. She stated she tried to call us on Friday, however our office is closed on Fridays. She did not reschedule.

## 2019-05-16 ENCOUNTER — Other Ambulatory Visit: Payer: 59

## 2019-05-16 ENCOUNTER — Ambulatory Visit: Payer: Self-pay | Admitting: Allergy

## 2019-05-17 ENCOUNTER — Ambulatory Visit: Payer: 59 | Admitting: Psychiatry

## 2019-05-18 ENCOUNTER — Other Ambulatory Visit: Payer: Self-pay | Admitting: Psychiatry

## 2019-05-18 DIAGNOSIS — F3181 Bipolar II disorder: Secondary | ICD-10-CM

## 2019-05-18 DIAGNOSIS — F5101 Primary insomnia: Secondary | ICD-10-CM

## 2019-05-20 ENCOUNTER — Other Ambulatory Visit: Payer: Self-pay

## 2019-05-20 DIAGNOSIS — F5101 Primary insomnia: Secondary | ICD-10-CM

## 2019-05-20 MED ORDER — ZOLPIDEM TARTRATE 10 MG PO TABS: ORAL_TABLET | ORAL | 1 refills | Status: DC

## 2019-05-20 NOTE — Telephone Encounter (Signed)
Didn't see buspar on her med list?

## 2019-05-23 ENCOUNTER — Ambulatory Visit (INDEPENDENT_AMBULATORY_CARE_PROVIDER_SITE_OTHER): Payer: 59 | Admitting: Psychiatry

## 2019-05-23 ENCOUNTER — Other Ambulatory Visit: Payer: Self-pay

## 2019-05-23 DIAGNOSIS — F3181 Bipolar II disorder: Secondary | ICD-10-CM | POA: Diagnosis not present

## 2019-05-23 NOTE — Progress Notes (Signed)
[facility] Counselor/Therapist Progress Note  Patient ID: Chelsea Benson, MRN: [id number],    Date: 05/23/2019  Time Spent: 60 minutes     9:00am to 10:00am  Treatment Type: Individual Therapy  Reported Symptoms:    Frustration (less), anxiety, irritability (less)   New medications or health issues since last appt:  Patient reports taking "cough medications" per her primary care doctor.  States cough was related to allergies and asthma.  Mental Status Exam:  Appearance:   casual  Behavior:  Appropriate and Sharing  Motor:  Normal  Speech/Language:   Normal Rate  Affect:  anxious  Mood:  anxious  Thought process:  normal  Thought content:    WNL  Sensory/Perceptual disturbances:    WNL  Orientation:  oriented to person, place, time/date, situation, day of week, month of year and year  Attention:  Good  Concentration:  Good  Memory:  WNL  Fund of knowledge:   Good  Insight:    Fair  Judgment:   Good  Impulse Control:  Fair   Risk Assessment: Danger to Self:  No Self-injurious Behavior: No Danger to Others: No Duty to Warn:no Physical Aggression / Violence:No  Access to Firearms a concern: No  Gang Involvement:No   Subjective:  Patent in office today reporting symptoms noted above.  Has been anxious, less frustrated, less irritable, and stressed.  Concerned with the ongoing pandemic, as well as the racial unrest throughout the country.  Multiple family concerns with relationships and interactions, some that she knew of and some she just recently learned of.  "Keeps going round and round in my mind".  Patient talks through the circumstances and expresses her frustration and some anger as the circumstances led her to realize how her life earlier on would have been much different if certain things had not occurred. Encouraged her openness as well as working to "let go" in order to not "get stuck " in negativity and regrets over which she had not control to change.   Chelsea Benson is moving out of her house and into his own apartment.  Brother continues to live in her home and is still working as weekend Training and development officer at [employer]. Still frustrated with him that he is not as careful as she is re: virus protections.  Talked through multiple stressors including those above and seemed to be calmer and less anxious after venting most of session.  Able to actually laugh at a couple things that have happened within past week.  Reviewed coping strategies to help her manage anxiety, stress, and frustrations. Laughing is healthy for her.  Doesn't seem to have many people in her life that listen to her, and she states she "doesn't want to talk to just anybody".   Reports that she is still following up with her doctors after having the fall recently. States she is having headaches and some nausea .    Diagnosis:   ICD-10-CM   1. Bipolar II disorder ([place])  F31.81     Plan:  Patient to follow through in using the strategies discussed today, remembering when she gets anxious, angry or frustrated, to be mindful of what is and is not in her control, and really think about what would be the best response for her.  Reviewed positive self-talk today and she was able to give clear examples as we spoke. Next appt within 2-3 wks.   Chelsea Ace, LCSW

## 2019-05-29 ENCOUNTER — Telehealth: Payer: Self-pay | Admitting: *Deleted

## 2019-05-29 ENCOUNTER — Ambulatory Visit: Payer: 59 | Admitting: Psychiatry

## 2019-05-29 ENCOUNTER — Other Ambulatory Visit: Payer: Self-pay

## 2019-05-29 ENCOUNTER — Encounter: Payer: Self-pay | Admitting: Allergy

## 2019-05-29 ENCOUNTER — Ambulatory Visit (INDEPENDENT_AMBULATORY_CARE_PROVIDER_SITE_OTHER): Payer: 59 | Admitting: Allergy

## 2019-05-29 DIAGNOSIS — J3089 Other allergic rhinitis: Secondary | ICD-10-CM | POA: Diagnosis not present

## 2019-05-29 DIAGNOSIS — J4540 Moderate persistent asthma, uncomplicated: Secondary | ICD-10-CM

## 2019-05-29 DIAGNOSIS — H1033 Unspecified acute conjunctivitis, bilateral: Secondary | ICD-10-CM | POA: Diagnosis not present

## 2019-05-29 DIAGNOSIS — J0110 Acute frontal sinusitis, unspecified: Secondary | ICD-10-CM | POA: Diagnosis not present

## 2019-05-29 MED ORDER — AMOXICILLIN-POT CLAVULANATE 875-125 MG PO TABS: 1.0000 | ORAL_TABLET | Freq: Two times a day (BID) | ORAL | 0 refills | Status: DC

## 2019-05-29 NOTE — Progress Notes (Signed)
RE: Chelsea Benson MRN: [id number] DOB: 08-01-1951 Date of Telemedicine Visit: 05/29/2019  Referring provider: Kathyrn Lass, MD Primary care provider: Kathyrn Lass, MD  Chief Complaint: Headache, Eye Problem, and Sinus Problem   Telemedicine Follow Up Visit via Telephone: I connected with Chelsea Benson for a follow up on 05/29/19 by telephone and verified that I am speaking with the correct person using two identifiers.   I discussed the limitations, risks, security and privacy concerns of performing an evaluation and management service by telephone and the availability of in person appointments. I also discussed with the patient that there may be a patient responsible charge related to this service. The patient expressed understanding and agreed to proceed.  Patient is at home.  Provider is at the office.  Visit start time: 8127 Visit end time: [place] consent/check in by: [facility] Medical consent and medical assistant/nurse: [facility]  History of Present Illness: She is a 68 y.o. female, who is being followed for allergic rhinitis with conjunctivitis, asthma and food allergy. Her previous allergy office visit was on 04/11/2019 with Chelsea Benson.   She has new complaints today.  She has been having a headache "all over" since Sunday.  She denies any vision changes or photophobia, N/V or motion exacerbating headache.  She state she has not taking any pain medication for the HA.  She also since Sunday has been having "goop" in both her eyes and waking up with eyes matted shut.  She states she goes to the bathroom in the morning and apply warm compress to open the eyes and has not really noted is the goop is clear/watery or more purulent.  She does state that the eyes do tend to become goopy during the day but again she is just wiping the eyes.  She does report her eyes a red and feels like there are "rocks" in the eyes.  Since yesterday she has been having ear pain and pressure  bilaterally.  She also reports having more nasal drainage and needing to throat clear more.  She also reports having sinus pressure in her forehead as well as the overall HA.  She denies fever and states she has been taking her temp daily and the highest thus far was on Monday at 99.4.   She has been taking her routine allergy medication including Allegra (take twice a day) as well as benadryl which has not seem to help current symptoms.  Also using her nasal sprays both flonase and astelin, singulair and pazeo.  The pazeo she states is not helping with the goopiness.   She denies any sick contacts but states she has been around her grandchildren who both have "sinus issues".    Of note she reports having a head injury about a month ago after falling and had a similar type HA at the time however it did eventually go away.  She was seen in the ED and has a negative head CT with no evidence of fractures.  She has an appt with NSG on Monday of next week.    Assessment and Plan: Chelsea Benson is a 68 y.o. female with:   Conjunctivitis, sinusitis, headache  - concerned at this time that she likely has acute sinusitis complicated with bacterial conjunctivitis given report of symptoms including red eyes, goopy discharge, grit sensation of the eye along with ear pain and pressure, increase sinus drainage and frontal pressure and HA.  Given symptoms are worsening will elect to treat empirically with Augmentin  which would cover for both AOM, sinusitis as well as conjunctivitis.   - I have counseled if headache or symptoms worsen she she go to ED for evaluation.   - tylenol or ibuprofen if able to tolerate for pain control  - continue warm compresses to eyes twice a day  - continue to monitor for fevers    Allergic rhinoconjunctivitis - Continue Flonase 2 sprays daily as needed.  Use flonase for 1-2 weeks at a time before stopping once symptoms improve. - Continue Astelin nasal spray 2 sprays in each nostril twice a  day as needed for nasal drainage/post-nasal drip - Continue saline nasal rinses once a day for nasal symptoms. Use before nasal sprays - Continue Allegra 180 mg daily use (may take additional dose if needed to control allergy symptoms) - Continue Singulair 10 mg at bedtime - Continue Pazeo eyedrop 1 drop in each as needed for watery, itchy, red eyes - Continue allergen avoidance measures   Moderate persistent asthma - Continue Symbicort 160 - 2 puffs twice daily - Continue Spiriva 2 puffs daily - take midday - Continue singulair as above - have access to albuterol inhaler 2 puffs every 4-6 hours as needed for cough/wheeze/shortness of breath/chest tightness.  May use 15-20 minutes prior to activity.   Monitor frequency of use.   - changed from Xolair to [facility] monthly autoinjector to allow for ease of home administration.  Daugther is giving her injections once a month.    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 - Carry EpiPen at all times.   Flexural atopic dermatitis - Continue moisturizing routine with Aveeno - Use triamcinolone 0.1% below the face.    Follow up in 3-4  or sooner if needed.     Diagnostics: None.  Medication List:  Current Outpatient Medications  Medication Sig Dispense Refill   ACCU-CHEK GUIDE test strip TEST BID PRN  1   albuterol (PROVENTIL HFA;VENTOLIN HFA) 108 (90 Base) MCG/ACT inhaler Inhale 2 puffs into the lungs every 4 (four) hours as needed. For wheezing (Patient taking differently: Inhale 2 puffs into the lungs every 4 (four) hours as needed for wheezing. ) 18 g 1   albuterol (PROVENTIL) (2.5 MG/3ML) 0.083% nebulizer solution Take 3 mLs (2.5 mg total) by nebulization every 4 (four) hours as needed for wheezing or shortness of breath. 375 mL 1   ARIPiprazole (ABILIFY) 2 MG tablet Take 1 tablet (2 mg total) by mouth daily. 90 tablet 1   aspirin EC 325 MG tablet Take 1 tablet (325 mg total) by mouth 2 (two) times daily. 30 tablet 0    aspirin EC 81 MG tablet Take 81 mg by mouth daily.     azelastine (ASTELIN) 0.1 % nasal spray Place 1 spray into both nostrils 2 (two) times daily. 30 mL 4   Blood Glucose Monitoring Suppl (ACCU-CHEK GUIDE) w/Device KIT See admin instructions.  1   busPIRone (BUSPAR) 15 MG tablet TAKE 1 TABLET(15 MG) BY MOUTH THREE TIMES DAILY 270 tablet 0   CLOBETASOL PROPIONATE E 0.05 % emollient cream Apply 1 application topically 2 (two) times daily as needed (rash).      cyclobenzaprine (FLEXERIL) 10 MG tablet Take 10 mg by mouth 2 (two) times a day.      diltiazem (CARDIZEM CD) 120 MG 24 hr capsule Take 120 mg by mouth daily.      EPINEPHrine (EPIPEN 2-PAK) 0.3 mg/0.3 mL IJ SOAJ injection Use as directed for severe allergic reaction (Patient taking differently:  Inject 0.3 mg into the muscle once as needed for anaphylaxis (severe allergic reaction). ) 2 Device 1   fexofenadine (ALLEGRA) 180 MG tablet Take 180 mg by mouth daily.     fluticasone (FLONASE) 50 MCG/ACT nasal spray Use 1-2 sprays in each nostril in each nostril once daily (Patient taking differently: Place 1 spray into both nostrils 2 (two) times a day. ) 48 g 1   glimepiride (AMARYL) 2 MG tablet Take 2 mg by mouth daily with breakfast.      hydrochlorothiazide (HYDRODIURIL) 25 MG tablet Take 25 mg by mouth daily.      hydrOXYzine (ATARAX/VISTARIL) 25 MG tablet TAKE 1 TABLET(25 MG) BY MOUTH EVERY 6 HOURS AS NEEDED 270 tablet 1   Mepolizumab (NUCALA) 100 MG/ML SOAJ Inject 100 mg into the skin every 30 (thirty) days.     mirtazapine (REMERON) 30 MG tablet TAKE 1 TABLET(30 MG) BY MOUTH AT BEDTIME 90 tablet 1   montelukast (SINGULAIR) 10 MG tablet Take 1 tablet (10 mg total) by mouth at bedtime. 90 tablet 1   Olopatadine HCl (PAZEO) 0.7 % SOLN Place 1 drop into both eyes daily as needed. (Patient taking differently: Place 1 drop into both eyes daily as needed (seasonal allergies). ) 1 Bottle 5   oxybutynin (DITROPAN-XL) 10 MG 24 hr  tablet Take 10 mg by mouth daily.      oxyCODONE-acetaminophen (PERCOCET) 10-325 MG tablet Take 1 tablet by mouth 2 (two) times daily as needed for pain.      potassium chloride SA (K-DUR) 20 MEQ tablet Take 20 mEq by mouth daily.     pravastatin (PRAVACHOL) 20 MG tablet Take 20 mg by mouth at bedtime.      pregabalin (LYRICA) 100 MG capsule Take 100 mg by mouth 3 (three) times daily.     Respiratory Therapy Supplies (NEBULIZER) DEVI Use as directed with nebulizer solution. 1 each 1   sitaGLIPtin-metformin (JANUMET) 50-1000 MG tablet Take 1 tablet by mouth 2 (two) times daily with a meal.     SPIRIVA HANDIHALER 18 MCG inhalation capsule PLACE 1 CAPSULE INTO INHALER AND INHALE DAILY (Patient taking differently: Place 18 mcg into inhaler and inhale daily at 12 noon. ) 30 capsule 3   SYMBICORT 160-4.5 MCG/ACT inhaler Inhale 2 puffs into the lungs 2 (two) times daily. 30.6 g 0   zolpidem (AMBIEN) 10 MG tablet Take 1/2-1 tab 90 tablet 1   amoxicillin-clavulanate (AUGMENTIN) 875-125 MG tablet Take 1 tablet by mouth 2 (two) times daily for 10 days. 20 tablet 0   benzonatate (TESSALON) 100 MG capsule TK 1 C PO TID FOR 10 DAYS PRN     Oxcarbazepine (TRILEPTAL) 300 MG tablet Take 1 tablet (300 mg total) by mouth every morning AND 2 tablets (600 mg total) at bedtime. Do all this for 30 days. 90 tablet 1   promethazine-dextromethorphan (PROMETHAZINE-DM) 6.25-15 MG/5ML syrup TK 5 ML PO Q 6 H FOR 7 DAYS PRN     Current Facility-Administered Medications  Medication Dose Route Frequency Provider Last Rate Last Dose   omalizumab Arvid Right) injection 375 mg  375 mg Subcutaneous Q14 Days Kennith Gain, MD   375 mg at 02/12/19 1510   Allergies: Allergies  Allergen Reactions   Cozaar Cough   Crestor [Rosuvastatin Calcium] Other (See Comments)    Makes her heart beat really fast.   Carvedilol Cough   Cymbalta [Duloxetine Hcl] Other (See Comments)    Caused hair loss   Fish Allergy  Itching  Morphine And Related Itching   Rosuvastatin Calcium Other (See Comments) and Cough    Rapid heart rate   Almond Meal Itching and Swelling    Tongue swells and itches   Almond Oil Itching and Swelling    Itching and swelling of tongue    Losartan Potassium     Cough   I reviewed her past medical history, social history, family history, and environmental history and no significant changes have been reported from previous visit on 04/11/2019.  Review of Systems  Constitutional: Negative for chills and fever.  HENT: Positive for congestion, ear pain, rhinorrhea, sinus pressure and sinus pain. Negative for ear discharge, hearing loss, nosebleeds, postnasal drip, sneezing and sore throat.   Eyes: Positive for pain, discharge and redness. Negative for photophobia, itching and visual disturbance.  Respiratory: Negative for cough, chest tightness, shortness of breath and wheezing.   Cardiovascular: Negative.   Gastrointestinal: Negative.   Musculoskeletal: Negative for myalgias.  Skin: Negative for rash.  Neurological: Positive for headaches. Negative for dizziness.   Objective: Physical Exam Not obtained as encounter was done via telephone.   Previous notes and tests were reviewed.  I discussed the assessment and treatment plan with the patient. The patient was provided an opportunity to ask questions and all were answered. The patient agreed with the plan and demonstrated an understanding of the instructions.   The patient was advised to call back or seek an in-person evaluation if the symptoms worsen or if the condition fails to improve as anticipated.  I provided 19 minutes of non-face-to-face time during this encounter.  It was my pleasure to participate in [place] care today. Please feel free to contact me with any questions or concerns.   Sincerely,  Cameshia Cressman Charmian Muff, MD

## 2019-05-29 NOTE — Telephone Encounter (Signed)
Can she do a televisit (phone or mychart) at either 1115, 1130 or 3 today so I can get further information and determine if she may need an antibiotic?

## 2019-05-29 NOTE — Telephone Encounter (Signed)
Pt called complaining of waking up the past 3 days with eyes matted shut and a lot of mucus in her mouth that is clear and pain and pressure in both ears. She is taking everything prescribed at last visit 04/11/19. She is taking 2 allegra and 2 benadryl daily also. Please advise.

## 2019-05-29 NOTE — Telephone Encounter (Signed)
Patient will do televisit please place patient on schedule

## 2019-05-29 NOTE — Progress Notes (Signed)
Start 1030 Location home End 1141

## 2019-05-29 NOTE — Patient Instructions (Addendum)
Conjunctivitis, sinusitis, headache  - concerned at this time that she likely has acute sinusitis complicated with bacterial conjunctivitis given report of symptoms including red eyes, goopy discharge, grit sensation of the eye along with ear pain and pressure, increase sinus drainage and frontal pressure and HA.  Given symptoms are worsening will elect to treat empirically with Augmentin which would cover for both AOM, sinusitis as well as conjunctivitis.   - I have counseled if headache or symptoms worsen she she go to ED for evaluation.   - tylenol or ibuprofen if able to tolerate for pain control  - continue warm compresses to eyes twice a day  - continue to monitor for fevers    Allergic rhinoconjunctivitis - Continue Flonase 2 sprays daily as needed.  Use flonase for 1-2 weeks at a time before stopping once symptoms improve. - Continue Astelin nasal spray 2 sprays in each nostril twice a day as needed for nasal drainage/post-nasal drip - Continue saline nasal rinses once a day for nasal symptoms. Use before nasal sprays - Continue Allegra 180 mg daily use (may take additional dose if needed to control allergy symptoms) - Continue Singulair 10 mg at bedtime - Continue Pazeo eyedrop 1 drop in each as needed for watery, itchy, red eyes - Continue allergen avoidance measures   Moderate persistent asthma - Continue Symbicort 160 - 2 puffs twice daily - Continue Spiriva 2 puffs daily - take midday - Continue singulair as above - have access to albuterol inhaler 2 puffs every 4-6 hours as needed for cough/wheeze/shortness of breath/chest tightness.  May use 15-20 minutes prior to activity.   Monitor frequency of use.   - changed from Xolair to [facility] monthly autoinjector to allow for ease of home administration.  Daugther is giving her injections once a month.    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 - Carry EpiPen at all times.   Flexural atopic dermatitis - Continue  moisturizing routine with Aveeno - Use triamcinolone 0.1% below the face.     Follow up in 3-4  or sooner if needed.

## 2019-05-31 ENCOUNTER — Telehealth: Payer: Self-pay

## 2019-05-31 MED ORDER — NEBULIZER/TUBING/MOUTHPIECE KIT: PACK | 12 refills | Status: DC

## 2019-05-31 NOTE — Telephone Encounter (Signed)
Pt calling asking for a Rx for nebulizer tubing and supplies.  Rx has been faxed to [facility].  [phone]

## 2019-06-03 ENCOUNTER — Other Ambulatory Visit: Payer: 59

## 2019-06-05 ENCOUNTER — Ambulatory Visit (INDEPENDENT_AMBULATORY_CARE_PROVIDER_SITE_OTHER): Payer: 59 | Admitting: Internal Medicine

## 2019-06-05 ENCOUNTER — Other Ambulatory Visit: Payer: Self-pay

## 2019-06-05 ENCOUNTER — Encounter: Payer: Self-pay | Admitting: Internal Medicine

## 2019-06-05 DIAGNOSIS — J45991 Cough variant asthma: Secondary | ICD-10-CM

## 2019-06-05 DIAGNOSIS — J4540 Moderate persistent asthma, uncomplicated: Secondary | ICD-10-CM

## 2019-06-05 MED ORDER — SYMBICORT 160-4.5 MCG/ACT IN AERO: 2.0000 | INHALATION_SPRAY | Freq: Two times a day (BID) | RESPIRATORY_TRACT | 0 refills | Status: DC

## 2019-06-05 MED ORDER — PANTOPRAZOLE SODIUM 40 MG PO TBEC: 40.0000 mg | DELAYED_RELEASE_TABLET | Freq: Every day | ORAL | 2 refills | Status: DC

## 2019-06-05 MED ORDER — FAMOTIDINE 20 MG PO TABS: ORAL_TABLET | ORAL | 11 refills | Status: DC

## 2019-06-05 NOTE — Patient Instructions (Signed)
Stop spiriva and powdered albuterol (respiclick)   Plan A = Automatic =  symbicort 160 Take 2 puffs first thing in am and then another 2 puffs about 12 hours later.   Work on inhaler technique:  relax and gently blow all the way out then take a nice smooth deep breath back in, triggering the inhaler at same time you start breathing in.  Hold for up to 5 seconds if you can. Blow out thru nose. Rinse and gargle with water when done      Plan B = Backup Only use your albuterol nebulizer  as a rescue medication to be used if you can't catch your breath by resting or doing a relaxed purse lip breathing pattern.  - The less you use it, the better it will work when you need it. - Ok to use the inhaler up to  every 4 hours if you must but call for appointment if use goes up over your usual need  Pantoprazole (protonix) 40 mg   Take  30-60 min before first meal of the day and Pepcid (famotidine)  20 mg one after supper until return to office - this is the best way to tell whether stomach acid is contributing to your problem.    GERD (REFLUX)  is an extremely common cause of respiratory symptoms just like yours , many times with no obvious heartburn at all.    It can be treated with medication, but also with lifestyle changes including elevation of the head of your bed (ideally with 6 -8inch blocks under the headboard of your bed),  Smoking cessation, avoidance of late meals, excessive alcohol, and avoid fatty foods, chocolate, peppermint, colas, red wine, and acidic juices such as orange juice.  NO MINT OR MENTHOL PRODUCTS SO NO COUGH DROPS  USE SUGARLESS CANDY INSTEAD (Jolley ranchers or Stover's or Life Savers) or even ice chips will also do - the key is to swallow to prevent all throat clearing. NO OIL BASED VITAMINS - use powdered substitutes.  Avoid fish oil when coughing.    Please schedule a follow up office visit in 4 weeks, sooner if needed  with all medications /inhalers/ solutions in hand so  we can verify exactly what you are taking. This includes all medications from all doctors and over the counters

## 2019-06-05 NOTE — Progress Notes (Signed)
Chelsea Benson, female    DOB: 28-Aug-1951     MRN: [id number]   Brief patient profile:  75 yobf never smoker asthma from childhood on shots in 50's> no better  and started nucala June 5th 2020 per Nelva Bush with chronic cough Teryl Lucy always some better p prednisone referred to pulmonary clinic 06/05/2019 by Dr   Sabra Heck for refractory cough.      History of Present Illness  06/05/2019  Pulmonary/ 1st office eval/Latrese Carolan  Chief Complaint  Patient presents with  . Pulmonary Consult    Referred by Kathyrn Lass for eval of Asthma.   Dyspnea:  Limited by back more than breathing  Cough: 24/7 much more day than night and dry/hacking  Sleep: on side prop up on pillows x 2  SABA use: rarely / nebulizer helps more  No obvious day to day or daytime variability or assoc excess/ purulent sputum or mucus plugs or hemoptysis or cp or chest tightness, subjective wheeze or overt sinus or hb symptoms.   Sleeping as above without nocturnal  or early am exacerbation  of respiratory  c/o's or need for noct saba. Also denies any obvious fluctuation of symptoms with weather or environmental changes or other aggravating or alleviating factors except as outlined above   No unusual exposure hx or h/o childhood pna or knowledge of premature birth.  Current Allergies, Complete Past Medical History, Past Surgical History, Family History, and Social History were reviewed in [employer] record.  ROS  The following are not active complaints unless bolded Hoarseness, sore throat, dysphagia, dental problems, itching, sneezing,  nasal congestion or discharge of excess mucus or purulent secretions, ear ache,   fever, chills, sweats, unintended wt loss or wt gain, classically pleuritic or exertional cp,  orthopnea pnd or arm/hand swelling  or leg swelling, presyncope, palpitations, abdominal pain, anorexia, nausea, vomiting, diarrhea  or change in bowel habits or change in bladder habits, change in stools  or change in urine, dysuria, hematuria,  rash, arthralgias, visual complaints, headache, numbness, weakness or ataxia or problems with walking or coordination,  change in mood or  memory.            Past Medical History:  Diagnosis Date  . Allergic rhinoconjunctivitis   . Allergy history unknown   . Anxiety   . Arthritis    DDD, spondylosis  . Asthma   . Chronic back pain    Has had several surgeries.  He uses a walker  . Chronic kidney disease    renal calculi  . Depression   . Diabetes mellitus without complication ([place])    dx in 2007  . Diabetes mellitus, type II ([place])   . Eczema   . Headache(784.0)    sinus related   . History of kidney stones   . Hx of pulmonary embolus 2017  . Hyperlipidemia   . Hypertension   . Insomnia   . Knee pain   . Left ankle pain   . Mental disorder   . Mood disorder ([place])   . OSA on CPAP    PSG 05/24/2016: AHI 17/hour 02 mean 76%.  HSAT 06/13/17, AHI 11-hour 02 mean 79%  . Recurrent upper respiratory infection (URI)   . Sleep apnea     Outpatient Medications Prior to Visit  Medication Sig Dispense Refill  . ACCU-CHEK GUIDE test strip TEST BID PRN  1  . albuterol (PROVENTIL HFA;VENTOLIN HFA) 108 (90 Base) MCG/ACT inhaler Inhale 2 puffs into the lungs  every 4 (four) hours as needed. For wheezing (Patient taking differently: Inhale 2 puffs into the lungs every 4 (four) hours as needed for wheezing. ) 18 g 1  . albuterol (PROVENTIL) (2.5 MG/3ML) 0.083% nebulizer solution Take 3 mLs (2.5 mg total) by nebulization every 4 (four) hours as needed for wheezing or shortness of breath. 375 mL 1  . ARIPiprazole (ABILIFY) 2 MG tablet Take 1 tablet (2 mg total) by mouth daily. 90 tablet 1  . aspirin EC 81 MG tablet Take 81 mg by mouth daily.    Marland Kitchen azelastine (ASTELIN) 0.1 % nasal spray Place 1 spray into both nostrils 2 (two) times daily. 30 mL 4  . Blood Glucose Monitoring Suppl (ACCU-CHEK GUIDE) w/Device KIT See admin instructions.  1  . busPIRone  (BUSPAR) 15 MG tablet TAKE 1 TABLET(15 MG) BY MOUTH THREE TIMES DAILY 270 tablet 0  . CLOBETASOL PROPIONATE E 0.05 % emollient cream Apply 1 application topically 2 (two) times daily as needed (rash).     Marland Kitchen diltiazem (CARDIZEM CD) 120 MG 24 hr capsule Take 120 mg by mouth daily.     Marland Kitchen EPINEPHrine (EPIPEN 2-PAK) 0.3 mg/0.3 mL IJ SOAJ injection Use as directed for severe allergic reaction (Patient taking differently: Inject 0.3 mg into the muscle once as needed for anaphylaxis (severe allergic reaction). ) 2 Device 1  . fexofenadine (ALLEGRA) 180 MG tablet Take 180 mg by mouth daily.    . fluticasone (FLONASE) 50 MCG/ACT nasal spray Use 1-2 sprays in each nostril in each nostril once daily (Patient taking differently: Place 1 spray into both nostrils 2 (two) times a day. ) 48 g 1  . glimepiride (AMARYL) 2 MG tablet Take 2 mg by mouth daily with breakfast.     . hydrochlorothiazide (HYDRODIURIL) 25 MG tablet Take 25 mg by mouth daily.     . hydrOXYzine (ATARAX/VISTARIL) 25 MG tablet TAKE 1 TABLET(25 MG) BY MOUTH EVERY 6 HOURS AS NEEDED 270 tablet 1  . Mepolizumab (NUCALA) 100 MG/ML SOAJ Inject 100 mg into the skin every 30 (thirty) days.    . mirtazapine (REMERON) 30 MG tablet TAKE 1 TABLET(30 MG) BY MOUTH AT BEDTIME 90 tablet 1  . montelukast (SINGULAIR) 10 MG tablet Take 1 tablet (10 mg total) by mouth at bedtime. 90 tablet 1  . Olopatadine HCl (PAZEO) 0.7 % SOLN Place 1 drop into both eyes daily as needed. (Patient taking differently: Place 1 drop into both eyes daily as needed (seasonal allergies). ) 1 Bottle 5  . oxybutynin (DITROPAN-XL) 10 MG 24 hr tablet Take 10 mg by mouth daily.     Marland Kitchen oxyCODONE-acetaminophen (PERCOCET) 10-325 MG tablet Take 1 tablet by mouth 2 (two) times daily as needed for pain.     . potassium chloride SA (K-DUR) 20 MEQ tablet Take 20 mEq by mouth daily.    . pravastatin (PRAVACHOL) 20 MG tablet Take 20 mg by mouth at bedtime.     . pregabalin (LYRICA) 100 MG capsule Take  100 mg by mouth 3 (three) times daily.    . promethazine-dextromethorphan (PROMETHAZINE-DM) 6.25-15 MG/5ML syrup TK 5 ML PO Q 6 H FOR 7 DAYS PRN    . Respiratory Therapy Supplies (NEBULIZER) DEVI Use as directed with nebulizer solution. 1 each 1  . Respiratory Therapy Supplies (NEBULIZER/TUBING/MOUTHPIECE) KIT Use as directed with nebulizer machine 1 kit 12  . sitaGLIPtin-metformin (JANUMET) 50-1000 MG tablet Take 1 tablet by mouth 2 (two) times daily with a meal.    .  SPIRIVA HANDIHALER 18 MCG inhalation capsule PLACE 1 CAPSULE INTO INHALER AND INHALE DAILY (Patient taking differently: Place 18 mcg into inhaler and inhale daily at 12 noon. ) 30 capsule 3  . SYMBICORT 160-4.5 MCG/ACT inhaler Inhale 2 puffs into the lungs 2 (two) times daily. 30.6 g 0  . zolpidem (AMBIEN) 10 MG tablet Take 1/2-1 tab 90 tablet 1  . Oxcarbazepine (TRILEPTAL) 300 MG tablet Take 1 tablet (300 mg total) by mouth every morning AND 2 tablets (600 mg total) at bedtime. Do all this for 30 days. 90 tablet 1  . amoxicillin-clavulanate (AUGMENTIN) 875-125 MG tablet Take 1 tablet by mouth 2 (two) times daily for 10 days. 20 tablet 0  . aspirin EC 325 MG tablet Take 1 tablet (325 mg total) by mouth 2 (two) times daily. 30 tablet 0  . benzonatate (TESSALON) 100 MG capsule TK 1 C PO TID FOR 10 DAYS PRN    . cyclobenzaprine (FLEXERIL) 10 MG tablet Take 10 mg by mouth 2 (two) times a day.      Facility-Administered Medications Prior to Visit  Medication Dose Route Frequency Provider Last Rate Last Dose  . omalizumab Arvid Right) injection 375 mg  375 mg Subcutaneous Q14 Days Kennith Gain, MD   375 mg at 02/12/19 1510     Objective:     BP 120/80 (BP Location: Left Arm, Cuff Size: Normal)   Pulse 71   Temp 97.8 F (36.6 C) (Oral)   Ht 5' 4"  (1.626 m)   Wt 191 lb (86.6 kg)   SpO2 96%   BMI 32.79 kg/m   SpO2: 96 %  RA   Obese bf mod hoarse nad  HEENT: nl dentition, turbinates bilaterally, and oropharynx. Nl  external ear canals without cough reflex   NECK :  without JVD/Nodes/TM/ nl carotid upstrokes bilaterally   LUNGS: no acc muscle use,  Nl contour chest which is clear to A and P bilaterally without cough on insp or exp maneuvers   CV:  RRR  no s3 or murmur or increase in P2, and no edema   ABD:  Obese soft and nontender with nl inspiratory excursion in the supine position. No bruits or organomegaly appreciated, bowel sounds nl  MS:  Nl gait/ ext warm without deformities, calf tenderness, cyanosis or clubbing No obvious joint restrictions   SKIN: warm and dry without lesions    NEURO:  alert, approp, nl sensorium with  no motor or cerebellar deficits apparent.      I personally reviewed images and agree with radiology impression as follows:  CXR:   04/28/2019 No acute findings.  Lungs are clear.  Sinus CT 04/28/19 (part of head ct)   The visualized paranasal sinuses and mastoid air cells are clear.    Assessment   Cough variant asthma vs UACS Onset in childhood -  Spirometry 06/05/2019  FEV1 1.07 (55%)  Ratio 0.74 though f/v not really physiologic  - placed on nucala May 10 2019  - 06/05/2019  After extensive coaching inhaler device,  effectiveness =    75% > try consolidate rx with symb 160/ prn neb and d/c dpis = spiriva/ respiclick hfa    DDX of  difficult airways management almost all start with A and  include Adherence, Ace Inhibitors, Acid Reflux, Active Sinus Disease, Alpha 1 Antitripsin deficiency, Anxiety masquerading as Airways dz,  ABPA,  Allergy(esp in young), Aspiration (esp in elderly), Adverse effects of meds,  Active smoking or vaping, A bunch of PE's (  a small clot burden can't cause this syndrome unless there is already severe underlying pulm or vascular dz with poor reserve) plus two Bs  = Bronchiectasis and Beta blocker use..and one C= CHF   Adherence is always the initial "prime suspect" and is a multilayered concern that requires a "trust but verify" approach in  every patient - starting with knowing how to use medications, especially inhalers, correctly, keeping up with refills and understanding the fundamental difference between maintenance and prns vs those medications only taken for a very short course and then stopped and not refilled.  - see hfa teaching  - return with all meds in hand using a trust but verify approach to confirm accurate Medication  Reconciliation The principal here is that until we are certain that the  patients are doing what we've asked, it makes no sense to ask them to do more.    ? Adverse effects of meds > d/c DPI's for now as she is not so much bothered by the breathing but more by the cough   ? Acid (or non-acid) GERD > always difficult to exclude as up to 75% of pts in some series report no assoc GI/ Heartburn symptoms> rec max (24h)  acid suppression and diet restrictions/ reviewed and instructions given in writing.   ? Allergy > per Padgett   ? Anxiety > usually at the bottom of this list of usual suspects but should be   higher on this pt's based on H and P and note already on psychotropics and may interfere with adherence and also interpretation of response or lack thereof to symptom management which can be quite subjective.   ? active sinus dz > neg ct sinus 04/28/19  ? Chf/ cardiac asthma - nothing to suggest on cxr or exam and note able to lie flat hs     Total time devoted to counseling  > 50 % of initial 60 min office visit:  review case with pt / device teaching which extended face to face time for this visit discussion of options/alternatives/ personally creating written customized instructions  in presence of pt  then going over those specific  Instructions directly with the pt including how to use all of the meds but in particular covering each new medication in detail and the difference between the maintenance= "automatic" meds and the prns using an action plan format for the latter (If this problem/symptom =>  do that organization reading Left to right).  Please see AVS from this visit for a full list of these instructions which I personally wrote for this pt and  are unique to this visit.      Christinia Gully, MD 06/05/2019

## 2019-06-06 ENCOUNTER — Encounter: Payer: Self-pay | Admitting: Internal Medicine

## 2019-06-06 ENCOUNTER — Ambulatory Visit: Payer: 59 | Admitting: Psychiatry

## 2019-06-06 DIAGNOSIS — J45991 Cough variant asthma: Secondary | ICD-10-CM | POA: Insufficient documentation

## 2019-06-06 NOTE — Assessment & Plan Note (Addendum)
Onset in childhood -  Spirometry 06/05/2019  FEV1 1.07 (55%)  Ratio 0.74 though f/v not really physiologic  - placed on nucala May 10 2019  - 06/05/2019  After extensive coaching inhaler device,  effectiveness =    75% > try consolidate rx with symb 160/ prn neb and d/c dpis = spiriva/ respiclick hfa    DDX of  difficult airways management almost all start with A and  include Adherence, Ace Inhibitors, Acid Reflux, Active Sinus Disease, Alpha 1 Antitripsin deficiency, Anxiety masquerading as Airways dz,  ABPA,  Allergy(esp in young), Aspiration (esp in elderly), Adverse effects of meds,  Active smoking or vaping, A bunch of PE's (a small clot burden can't cause this syndrome unless there is already severe underlying pulm or vascular dz with poor reserve) plus two Bs  = Bronchiectasis and Beta blocker use..and one C= CHF   Adherence is always the initial "prime suspect" and is a multilayered concern that requires a "trust but verify" approach in every patient - starting with knowing how to use medications, especially inhalers, correctly, keeping up with refills and understanding the fundamental difference between maintenance and prns vs those medications only taken for a very short course and then stopped and not refilled.  - see hfa teaching  - return with all meds in hand using a trust but verify approach to confirm accurate Medication  Reconciliation The principal here is that until we are certain that the  patients are doing what we've asked, it makes no sense to ask them to do more.    ? Adverse effects of meds > d/c DPI's for now as she is not so much bothered by the breathing but more by the cough   ? Acid (or non-acid) GERD > always difficult to exclude as up to 75% of pts in some series report no assoc GI/ Heartburn symptoms> rec max (24h)  acid suppression and diet restrictions/ reviewed and instructions given in writing.   ? Allergy > per Padgett   ? Anxiety > usually at the bottom of this  list of usual suspects but should be   higher on this pt's based on H and P and note already on psychotropics and may interfere with adherence and also interpretation of response or lack thereof to symptom management which can be quite subjective.   ? active sinus dz > neg ct sinus 04/28/19  ? Chf/ cardiac asthma - nothing to suggest on cxr or exam and note able to lie flat hs     Total time devoted to counseling  > 50 % of initial 60 min office visit:  review case with pt / device teaching which extended face to face time for this visit discussion of options/alternatives/ personally creating written customized instructions  in presence of pt  then going over those specific  Instructions directly with the pt including how to use all of the meds but in particular covering each new medication in detail and the difference between the maintenance= "automatic" meds and the prns using an action plan format for the latter (If this problem/symptom => do that organization reading Left to right).  Please see AVS from this visit for a full list of these instructions which I personally wrote for this pt and  are unique to this visit.

## 2019-06-10 ENCOUNTER — Ambulatory Visit
Admission: RE | Admit: 2019-06-10 | Discharge: 2019-06-10 | Disposition: A | Discharge status: Home / Self Care | Payer: 59 | Source: Ambulatory Visit | Attending: Neurosurgery | Admitting: Neurosurgery

## 2019-06-10 ENCOUNTER — Other Ambulatory Visit: Payer: Self-pay

## 2019-06-10 DIAGNOSIS — M5440 Lumbago with sciatica, unspecified side: Secondary | ICD-10-CM

## 2019-06-10 MED ORDER — GADOBENATE DIMEGLUMINE 529 MG/ML IV SOLN
18.0000 mL | Freq: Once | INTRAVENOUS | Status: AC | PRN
  Administered 2019-06-10: 18 mL via INTRAVENOUS

## 2019-06-12 ENCOUNTER — Ambulatory Visit (INDEPENDENT_AMBULATORY_CARE_PROVIDER_SITE_OTHER): Payer: 59 | Admitting: Psychiatry

## 2019-06-12 ENCOUNTER — Other Ambulatory Visit: Payer: Self-pay

## 2019-06-12 ENCOUNTER — Encounter: Payer: Self-pay | Admitting: Psychiatry

## 2019-06-12 DIAGNOSIS — F419 Anxiety disorder, unspecified: Secondary | ICD-10-CM | POA: Diagnosis not present

## 2019-06-12 DIAGNOSIS — F3181 Bipolar II disorder: Secondary | ICD-10-CM

## 2019-06-12 DIAGNOSIS — F5101 Primary insomnia: Secondary | ICD-10-CM

## 2019-06-12 MED ORDER — BELSOMRA 15 MG PO TABS: 15.0000 mg | ORAL_TABLET | Freq: Every day | ORAL | 0 refills | Status: DC

## 2019-06-12 MED ORDER — BUSPIRONE HCL 15 MG PO TABS: 15.0000 mg | ORAL_TABLET | Freq: Three times a day (TID) | ORAL | 0 refills | Status: DC

## 2019-06-12 NOTE — Progress Notes (Signed)
Chelsea Benson [id number] 09-21-1951 68 y.o.  Subjective:   Patient ID:  Chelsea Benson is a 68 y.o. (DOB 1951-03-31) female.  Chief Complaint:  Chief Complaint  Patient presents with  . Insomnia  . Anxiety    HPI KAILY WRAGG presents to the office today for follow-up of insomnia, anxiety, and depression. She reports "I'm not sleeping again." Reports that she was sleeping well until 3 weeks ago. She reports that she is having middle of the night awakening and will fall back to sleep at different times. Reports that she feels tired upon awakening and has difficulty waking up and getting up. She reports that she is likely getting 8 hours of sleep total. Reports that she has been falling asleep during the day without intent. Reports that she has not been able to sleep on her left side due to pain.   She reports that she has had increased anxiety with the pandemic. Reports that she has not been leaving home very often. Reports anxiety about getting COVID. Reports some excessive handwashing due to fear of contamination/infection. Has also been telling family to wash their hands repeatedly. Grandson just moved out and thinks that this has been on her mind. Reports some worry about grandchildren. Reports that she would like to take a trip but cannot because of pandemic. Reports that she had some panic s/s with recent MRI.   She reports that her mood has been "pretty good... more good days than bad days." Reports that her appetite is fair but has been getting tired of cooking for herself and eating the same foods. Reports that her energy improves as the day progresses. Reports motivation has been good. Reports that she has been cleaning and disinfecting every other day. She reports impaired concentration and having difficulty focusing on things. Denies SI.    Review of Systems:  Review of Systems  Eyes: Positive for discharge.  Gastrointestinal:       Has had recent reflux and has started  new medication and dietary changes.  Musculoskeletal: Positive for back pain and gait problem.       Uses rolator.   Allergic/Immunologic: Positive for environmental allergies.  Neurological: Positive for tremors and headaches. Negative for dizziness and light-headedness.  Psychiatric/Behavioral:       Please refer to HPI   Reports that she had a recent fall and fell backwards. She reports that she was referred to a neurosurgeon. She reports that she has been having some difficulty recalling certain things and having some word finding errors and reports that this has been improving.    Medications: I have reviewed the patient's current medications.  Current Outpatient Medications  Medication Sig Dispense Refill  . albuterol (PROVENTIL) (2.5 MG/3ML) 0.083% nebulizer solution Take 3 mLs (2.5 mg total) by nebulization every 4 (four) hours as needed for wheezing or shortness of breath. 375 mL 1  . ARIPiprazole (ABILIFY) 2 MG tablet Take 1 tablet (2 mg total) by mouth daily. 90 tablet 1  . aspirin EC 81 MG tablet Take 81 mg by mouth daily.    Marland Kitchen azelastine (ASTELIN) 0.1 % nasal spray Place 1 spray into both nostrils 2 (two) times daily. 30 mL 4  . busPIRone (BUSPAR) 15 MG tablet Take 1 tablet (15 mg total) by mouth 3 (three) times daily. 270 tablet 0  . diltiazem (CARDIZEM CD) 120 MG 24 hr capsule Take 120 mg by mouth daily.     . famotidine (PEPCID) 20 MG tablet One after  supper 30 tablet 11  . fexofenadine (ALLEGRA) 180 MG tablet Take 180 mg by mouth daily.    Marland Kitchen glimepiride (AMARYL) 2 MG tablet Take 2 mg by mouth daily with breakfast.     . hydrOXYzine (ATARAX/VISTARIL) 25 MG tablet TAKE 1 TABLET(25 MG) BY MOUTH EVERY 6 HOURS AS NEEDED 270 tablet 1  . mirtazapine (REMERON) 30 MG tablet TAKE 1 TABLET(30 MG) BY MOUTH AT BEDTIME 90 tablet 1  . montelukast (SINGULAIR) 10 MG tablet Take 1 tablet (10 mg total) by mouth at bedtime. 90 tablet 1  . Olopatadine HCl (PAZEO) 0.7 % SOLN Place 1 drop into  both eyes daily as needed. (Patient taking differently: Place 1 drop into both eyes daily as needed (seasonal allergies). ) 1 Bottle 5  . oxybutynin (DITROPAN-XL) 10 MG 24 hr tablet Take 10 mg by mouth daily.     Marland Kitchen oxyCODONE-acetaminophen (PERCOCET) 10-325 MG tablet Take 1 tablet by mouth 2 (two) times daily as needed for pain.     . pantoprazole (PROTONIX) 40 MG tablet Take 1 tablet (40 mg total) by mouth daily. Take 30-60 min before first meal of the day 30 tablet 2  . potassium chloride SA (K-DUR) 20 MEQ tablet Take 20 mEq by mouth daily.    . pravastatin (PRAVACHOL) 20 MG tablet Take 20 mg by mouth at bedtime.     . pregabalin (LYRICA) 100 MG capsule Take 100 mg by mouth 3 (three) times daily.    . sitaGLIPtin-metformin (JANUMET) 50-1000 MG tablet Take 1 tablet by mouth 2 (two) times daily with a meal.    . SYMBICORT 160-4.5 MCG/ACT inhaler Inhale 2 puffs into the lungs 2 (two) times daily. 1 Inhaler 0  . zolpidem (AMBIEN) 10 MG tablet Take 1/2-1 tab 90 tablet 1  . ACCU-CHEK GUIDE test strip TEST BID PRN  1  . Blood Glucose Monitoring Suppl (ACCU-CHEK GUIDE) w/Device KIT See admin instructions.  1  . busPIRone (BUSPAR) 15 MG tablet Take 1 tablet (15 mg total) by mouth 3 (three) times daily. 270 tablet 1  . CLOBETASOL PROPIONATE E 0.05 % emollient cream Apply 1 application topically 2 (two) times daily as needed (rash).     . EPINEPHrine (EPIPEN 2-PAK) 0.3 mg/0.3 mL IJ SOAJ injection Use as directed for severe allergic reaction (Patient taking differently: Inject 0.3 mg into the muscle once as needed for anaphylaxis (severe allergic reaction). ) 2 Device 1  . fluticasone (FLONASE) 50 MCG/ACT nasal spray Use 1-2 sprays in each nostril in each nostril once daily (Patient taking differently: Place 1 spray into both nostrils 2 (two) times a day. ) 48 g 1  . hydrochlorothiazide (HYDRODIURIL) 25 MG tablet Take 25 mg by mouth daily.     . Mepolizumab (NUCALA) 100 MG/ML SOAJ Inject 100 mg into the skin  every 30 (thirty) days.    . Oxcarbazepine (TRILEPTAL) 300 MG tablet Take 1 tablet (300 mg total) by mouth every morning AND 2 tablets (600 mg total) at bedtime. Do all this for 30 days. 90 tablet 1  . promethazine-dextromethorphan (PROMETHAZINE-DM) 6.25-15 MG/5ML syrup TK 5 ML PO Q 6 H FOR 7 DAYS PRN    . Respiratory Therapy Supplies (NEBULIZER) DEVI Use as directed with nebulizer solution. 1 each 1  . Respiratory Therapy Supplies (NEBULIZER/TUBING/MOUTHPIECE) KIT Use as directed with nebulizer machine 1 kit 12  . Suvorexant (BELSOMRA) 15 MG TABS Take 15 mg by mouth at bedtime for 9 days. Start 20 mg po QHS if ineffective and  no tolerability issues. 9 tablet 0   Current Facility-Administered Medications  Medication Dose Route Frequency Provider Last Rate Last Dose  . omalizumab Arvid Right) injection 375 mg  375 mg Subcutaneous Q14 Days Kennith Gain, MD   375 mg at 02/12/19 1510    Medication Side Effects: None  Allergies:  Allergies  Allergen Reactions  . Cozaar Cough  . Crestor [Rosuvastatin Calcium] Other (See Comments)    Makes her heart beat really fast.  . Carvedilol Cough  . Cymbalta [Duloxetine Hcl] Other (See Comments)    Caused hair loss  . Fish Allergy Itching  . Morphine And Related Itching  . Rosuvastatin Calcium Other (See Comments) and Cough    Rapid heart rate  . Almond Meal Itching and Swelling    Tongue swells and itches  . Almond Oil Itching and Swelling    Itching and swelling of tongue   . Losartan Potassium     Cough    Past Medical History:  Diagnosis Date  . Allergic rhinoconjunctivitis   . Allergy history unknown   . Anxiety   . Arthritis    DDD, spondylosis  . Asthma   . Chronic back pain    Has had several surgeries.  He uses a walker  . Chronic kidney disease    renal calculi  . Depression   . Diabetes mellitus without complication ([place])    dx in 2007  . Diabetes mellitus, type II ([place])   . Eczema   . Headache(784.0)    sinus  related   . History of kidney stones   . Hx of pulmonary embolus 2017  . Hyperlipidemia   . Hypertension   . Insomnia   . Knee pain   . Left ankle pain   . Mental disorder   . Mood disorder ([place])   . OSA on CPAP    PSG 05/24/2016: AHI 17/hour 02 mean 76%.  HSAT 06/13/17, AHI 11-hour 02 mean 79%  . Recurrent upper respiratory infection (URI)   . Sleep apnea     Family History  Problem Relation Age of Onset  . Diabetes Sister   . Diabetes Brother   . Hypertension Brother   . Cancer Maternal Grandmother   . Heart failure Maternal Grandmother   . Hypertension Paternal Grandmother   . Asthma Daughter   . Cancer Daughter   . Depression Daughter   . Hypertension Daughter   . Heart failure Maternal Aunt   . Pneumonia Mother   . Diabetes Mother   . Alcoholism Father   . Alcohol abuse Father   . Depression Daughter   . Schizophrenia Grandchild   . Allergies Neg Hx   . Eczema Neg Hx   . Immunodeficiency Neg Hx     Social History   Socioeconomic History  . Marital status: Single    Spouse name: Not on file  . Number of children: 2  . Years of education: Not on file  . Highest education level: Not on file  Occupational History  . Not on file  Social Needs  . Financial resource strain: Not on file  . Food insecurity    Worry: Not on file    Inability: Not on file  . Transportation needs    Medical: Not on file    Non-medical: Not on file  Tobacco Use  . Smoking status: Passive Smoke Exposure - Never Smoker  . Smokeless tobacco: Never Used  . Tobacco comment: grandson smokes, but in his car  Substance  and Sexual Activity  . Alcohol use: Yes    Comment: rare use  . Drug use: No  . Sexual activity: Not Currently  Lifestyle  . Physical activity    Days per week: Not on file    Minutes per session: Not on file  . Stress: Not on file  Relationships  . Social Herbalist on phone: Not on file    Gets together: Not on file    Attends religious service: Not  on file    Active member of club or organization: Not on file    Attends meetings of clubs or organizations: Not on file    Relationship status: Not on file  . Intimate partner violence    Fear of current or ex partner: Not on file    Emotionally abused: Not on file    Physically abused: Not on file    Forced sexual activity: Not on file  Other Topics Concern  . Not on file  Social History Narrative   She is currently single.  Has 2 girls.  Has some college education.  Currently disabled due to chronic back pain and not employed.    Past Medical History, Surgical history, Social history, and Family history were reviewed and updated as appropriate.   Please see review of systems for further details on the patient's review from today.   Objective:   Physical Exam:  There were no vitals taken for this visit.  Physical Exam Constitutional:      General: She is not in acute distress.    Appearance: She is well-developed.  Musculoskeletal:     Comments: Pt using rolator to ambulate  Neurological:     Mental Status: She is alert and oriented to person, place, and time.     Coordination: Coordination normal.  Psychiatric:        Attention and Perception: Attention and perception normal. She does not perceive auditory or visual hallucinations.        Mood and Affect: Mood is anxious. Mood is not depressed. Affect is not labile, blunt, angry or inappropriate.        Speech: Speech normal.        Behavior: Behavior normal.        Thought Content: Thought content normal. Thought content does not include homicidal or suicidal ideation. Thought content does not include homicidal or suicidal plan.        Cognition and Memory: Cognition and memory normal.        Judgment: Judgment normal.     Comments: Insight intact. No delusions.      Lab Review:     Component Value Date/Time   NA 136 04/28/2019 1712   NA 134 (A) 05/23/2017   K 3.7 04/28/2019 1712   CL 101 04/28/2019 1712   CO2  25 04/28/2019 1712   GLUCOSE 269 (H) 04/28/2019 1712   BUN 15 04/28/2019 1712   BUN 14 05/23/2017   CREATININE 0.71 04/28/2019 1712   CALCIUM 9.5 04/28/2019 1712   PROT 7.3 04/28/2019 1712   ALBUMIN 4.2 04/28/2019 1712   AST 21 04/28/2019 1712   ALT 30 04/28/2019 1712   ALKPHOS 68 04/28/2019 1712   BILITOT 0.5 04/28/2019 1712   GFRNONAA >60 04/28/2019 1712   GFRAA >60 04/28/2019 1712       Component Value Date/Time   WBC 5.4 04/28/2019 1712   RBC 4.92 04/28/2019 1712   HGB 12.8 04/28/2019 1712   HGB 12.5 11/08/2018 1041  HCT 41.1 04/28/2019 1712   HCT 39.5 11/08/2018 1041   PLT 233 04/28/2019 1712   MCV 83.5 04/28/2019 1712   MCV 81 11/08/2018 1041   MCH 26.0 04/28/2019 1712   MCHC 31.1 04/28/2019 1712   RDW 14.6 04/28/2019 1712   RDW 14.3 11/08/2018 1041   LYMPHSABS 2.1 04/28/2019 1712   LYMPHSABS 1.4 11/08/2018 1041   MONOABS 0.5 04/28/2019 1712   EOSABS 0.3 04/28/2019 1712   EOSABS 0.2 11/08/2018 1041   BASOSABS 0.0 04/28/2019 1712   BASOSABS 0.0 11/08/2018 1041    No results found for: POCLITH, LITHIUM   No results found for: PHENYTOIN, PHENOBARB, VALPROATE, CBMZ   .res Assessment: Plan:   Discussed potential benefits, risks, and side effects of several tx options to include Belsomra or increase in Buspar. Pt agrees to start trial of Belsomra. Pt provided with samples of Belsomra.  Pt to start Belsomra 15 mg po QHS x 9 days, then increase to 20 mg po QHS if insomnia has not improved and there are no tolerability issues.  Will continue all medications as prescribed. Pt to f/u in 2 months or sooner if clinically indicated.  Patient advised to contact office with any questions, adverse effects, or acute worsening in signs and symptoms.  Casy was seen today for insomnia and anxiety.  Diagnoses and all orders for this visit:  Anxiety disorder, unspecified type -     busPIRone (BUSPAR) 15 MG tablet; Take 1 tablet (15 mg total) by mouth 3 (three) times  daily.  Primary insomnia -     Suvorexant (BELSOMRA) 15 MG TABS; Take 15 mg by mouth at bedtime for 9 days. Start 20 mg po QHS if ineffective and no tolerability issues.  Bipolar II disorder ([place])     Please see After Visit Summary for patient specific instructions.  Future Appointments  Date Time Provider [place]  06/20/2019  8:00 AM Shanon Ace, LCSW [facility] None  07/01/2019  9:00 AM Penumalli, Earlean Polka, MD [facility] None  07/04/2019  8:00 AM Shanon Ace, LCSW [facility] None  07/08/2019  9:45 AM Tanda Rockers, MD [facility] None  08/13/2019 11:30 AM Thayer Headings, PMHNP [facility] None    No orders of the defined types were placed in this encounter.   -------------------------------

## 2019-06-17 ENCOUNTER — Other Ambulatory Visit: Payer: Self-pay | Admitting: Psychiatry

## 2019-06-17 DIAGNOSIS — F5101 Primary insomnia: Secondary | ICD-10-CM

## 2019-06-17 DIAGNOSIS — F419 Anxiety disorder, unspecified: Secondary | ICD-10-CM

## 2019-06-20 ENCOUNTER — Ambulatory Visit (INDEPENDENT_AMBULATORY_CARE_PROVIDER_SITE_OTHER): Payer: 59 | Admitting: Psychiatry

## 2019-06-20 ENCOUNTER — Other Ambulatory Visit: Payer: Self-pay

## 2019-06-20 DIAGNOSIS — F419 Anxiety disorder, unspecified: Secondary | ICD-10-CM | POA: Diagnosis not present

## 2019-06-20 NOTE — Progress Notes (Signed)
[facility] Counselor/Therapist Progress Note  Patient ID: Chelsea Benson, MRN: [id number],    Date: 06/20/2019  Time Spent: 60 minutes     8:00am to 9:00am  Virtual Visit Note Connected with patient by a video enabled telemedicine/telehealth application or telephone, with their informed consent, and verified patient privacy and that I am speaking with the correct person using two identifiers. I discussed the limitations, risks, security and privacy concerns of performing psychotherapy and management service by telephone and the availability of in person appointments. I also discussed with the patient that there may be a patient responsible charge related to this service. The patient expressed understanding and agreed to proceed. I discussed the treatment planning with the patient. The patient was provided an opportunity to ask questions and all were answered. The patient agreed with the plan and demonstrated an understanding of the instructions. The patient was advised to call  our office if  symptoms worsen or feel they are in a crisis state and need immediate contact.   Therapist Location: [facility] Patient Location: home   Treatment Type: Individual Therapy  Reported Symptoms:    anxiety and frustration  New medications or health issues since last appt:   Having some pain in head and shakiness in hand/arm occasionally. " Better today" and states she is in touch with her PCP and has an appt with neurologist July 27th.   *Did get a Life Alert button to wear in case she should ever have an emergency at home alone and need medical attention.    Mental Status Exam:  Appearance:   casual  Behavior:  Appropriate and Sharing  Motor:  Normal  Speech/Language:   Normal Rate  Affect:  anxious  Mood:  anxious, frustrated  Thought process:  normal  Thought content:    WNL  Sensory/Perceptual disturbances:    WNL  Orientation:  oriented to person, place, time/date,  situation, day of week, month of year and year  Attention:  Good  Concentration:  Good  Memory:  WNL  Fund of knowledge:   Good  Insight:    Fair  Judgment:   Good  Impulse Control:  Fair   Risk Assessment: Danger to Self:  No Self-injurious Behavior: No Danger to Others: No Duty to Warn:no Physical Aggression / Violence:No  Access to Firearms a concern: No  Gang Involvement:No   Subjective:  Patent today reporting symptoms noted above.  Anxious and frustrated, less irritable. Is stressed with some health concerns right now, grandson has moved out of the home and into his own apt, the ongoing nature of the coronavirus, and racial tensions throughout the country.  Multiple family concerns with relationships and interactions, some that she knew of and some she just recently learned of.  Brother continues to live in her home and is still working as weekend cook at [employer] facility. Still frustrated with him that he is not as careful as she is re: virus protections, and I encouraged patient to continue to closely follow all the recommended precautions. Had been feeling badly past couple days with"head pain and shakiness" but am feeling much better today.  States she's been in touch with her PCP and has appt July 27th with neurologist. Talked through the multiple stressors mentioned above and seemed to be calmer and less anxious after venting most of session. Feeling listened to and heard seemed to help her. Reviewed coping strategies to help her manage anxiety, stress, and frustrations, and encouraged good self-care  to continue.     Diagnosis:   ICD-10-CM   1. Anxiety disorder, unspecified type  F41.9     Plan:  Patient to follow through in using the strategies discussed today, remembering when she gets anxious, angry or frustrated, to be mindful of what is and is not in her control, and really think about what would be the best response for her.  Reviewed positive self-talk today and she was  able to give clear examples as we spoke. Next appt within 2-3 wks.   Mathis Fareeborah Maisee Vollman, LCSW

## 2019-06-26 ENCOUNTER — Encounter: Payer: Self-pay | Admitting: *Deleted

## 2019-06-30 ENCOUNTER — Other Ambulatory Visit: Payer: Self-pay | Admitting: Psychiatry

## 2019-06-30 DIAGNOSIS — F419 Anxiety disorder, unspecified: Secondary | ICD-10-CM

## 2019-06-30 DIAGNOSIS — F5101 Primary insomnia: Secondary | ICD-10-CM

## 2019-06-30 DIAGNOSIS — F3181 Bipolar II disorder: Secondary | ICD-10-CM

## 2019-07-01 ENCOUNTER — Encounter: Payer: Self-pay | Admitting: Diagnostic Neuroimaging

## 2019-07-01 ENCOUNTER — Ambulatory Visit (INDEPENDENT_AMBULATORY_CARE_PROVIDER_SITE_OTHER): Payer: 59 | Admitting: Diagnostic Neuroimaging

## 2019-07-01 ENCOUNTER — Telehealth: Payer: Self-pay | Admitting: Diagnostic Neuroimaging

## 2019-07-01 ENCOUNTER — Other Ambulatory Visit: Payer: Self-pay

## 2019-07-01 VITALS — BP 166/93 | HR 79 | Temp 97.5°F | Ht 64.0 in | Wt 189.4 lb

## 2019-07-01 DIAGNOSIS — G44229 Chronic tension-type headache, not intractable: Secondary | ICD-10-CM

## 2019-07-01 DIAGNOSIS — R251 Tremor, unspecified: Secondary | ICD-10-CM

## 2019-07-01 DIAGNOSIS — R29898 Other symptoms and signs involving the musculoskeletal system: Secondary | ICD-10-CM | POA: Diagnosis not present

## 2019-07-01 DIAGNOSIS — F0781 Postconcussional syndrome: Secondary | ICD-10-CM | POA: Diagnosis not present

## 2019-07-01 DIAGNOSIS — R413 Other amnesia: Secondary | ICD-10-CM | POA: Diagnosis not present

## 2019-07-01 NOTE — Progress Notes (Signed)
[facility]  PATIENT: Chelsea Benson DOB: 1951-03-18  REFERRING CLINICIAN: Wilhemena Durie HISTORY FROM: patient  REASON FOR VISIT: new consult    HISTORICAL  CHIEF COMPLAINT:  Chief Complaint  Patient presents with  . Memory change s/p fall    rm 6 New Pt  "fell in May x 2, went to bed and woke 4 hours later; now have tremors, weak grip in right hand"  MMSE 21    HISTORY OF PRESENT ILLNESS:   68 year old female here for evaluation of postconcussion syndrome.  May 2020 patient got dizzy and fell down.  She hit her head.  Ever since that time she has had headaches, weakness in her right hand, tremors in her right arm, memory loss.  She also has bipolar disorder, obstructive sleep apnea, chronic pain syndrome.  Patient lives with her brother.  She gets help from her grandson and daughters.  She is usually supervised and not left alone 24 hours a day.  Patient able to take care of most of her personal activities of daily living.  She needs help and assistance when she goes outside of her home.   REVIEW OF SYSTEMS: Full 14 system review of systems performed and negative with exception of: As per HPI.  ALLERGIES: Allergies  Allergen Reactions  . Cozaar Cough  . Crestor [Rosuvastatin Calcium] Other (See Comments)    Makes her heart beat really fast.  . Carvedilol Cough  . Cymbalta [Duloxetine Hcl] Other (See Comments)    Caused hair loss  . Fish Allergy Itching  . Morphine And Related Itching  . Rosuvastatin Calcium Other (See Comments) and Cough    Rapid heart rate  . Almond Meal Itching and Swelling    Tongue swells and itches  . Almond Oil Itching and Swelling    Itching and swelling of tongue   . Losartan Potassium     Cough    HOME MEDICATIONS: Outpatient Medications Prior to Visit  Medication Sig Dispense Refill  . ACCU-CHEK GUIDE test strip TEST BID PRN  1  . albuterol (PROVENTIL) (2.5 MG/3ML) 0.083% nebulizer solution Take 3 mLs (2.5 mg total)  by nebulization every 4 (four) hours as needed for wheezing or shortness of breath. 375 mL 1  . ARIPiprazole (ABILIFY) 2 MG tablet Take 1 tablet (2 mg total) by mouth daily. 90 tablet 1  . aspirin EC 81 MG tablet Take 81 mg by mouth daily.    Marland Kitchen azelastine (ASTELIN) 0.1 % nasal spray Place 1 spray into both nostrils 2 (two) times daily. 30 mL 4  . Blood Glucose Monitoring Suppl (ACCU-CHEK GUIDE) w/Device KIT See admin instructions.  1  . busPIRone (BUSPAR) 15 MG tablet Take 1 tablet (15 mg total) by mouth 3 (three) times daily. 270 tablet 1  . busPIRone (BUSPAR) 15 MG tablet Take 1 tablet (15 mg total) by mouth 3 (three) times daily. 270 tablet 0  . CLOBETASOL PROPIONATE E 0.05 % emollient cream Apply 1 application topically 2 (two) times daily as needed (rash).     Marland Kitchen diltiazem (CARDIZEM CD) 120 MG 24 hr capsule Take 120 mg by mouth daily.     Marland Kitchen EPINEPHrine (EPIPEN 2-PAK) 0.3 mg/0.3 mL IJ SOAJ injection Use as directed for severe allergic reaction (Patient taking differently: Inject 0.3 mg into the muscle once as needed for anaphylaxis (severe allergic reaction). ) 2 Device 1  . famotidine (PEPCID) 20 MG tablet One after supper 30 tablet 11  . fexofenadine (ALLEGRA) 180 MG  tablet Take 180 mg by mouth daily.    . fluticasone (FLONASE) 50 MCG/ACT nasal spray Use 1-2 sprays in each nostril in each nostril once daily (Patient taking differently: Place 1 spray into both nostrils 2 (two) times a day. ) 48 g 1  . glimepiride (AMARYL) 2 MG tablet Take 2 mg by mouth daily with breakfast.     . hydrochlorothiazide (HYDRODIURIL) 25 MG tablet Take 25 mg by mouth daily.     . hydrOXYzine (ATARAX/VISTARIL) 25 MG tablet TAKE 1 TABLET(25 MG) BY MOUTH EVERY 6 HOURS AS NEEDED 270 tablet 1  . Mepolizumab (NUCALA) 100 MG/ML SOAJ Inject 100 mg into the skin every 30 (thirty) days.    . mirtazapine (REMERON) 30 MG tablet TAKE 1 TABLET(30 MG) BY MOUTH AT BEDTIME 90 tablet 1  . montelukast (SINGULAIR) 10 MG tablet Take 1  tablet (10 mg total) by mouth at bedtime. 90 tablet 1  . Olopatadine HCl (PAZEO) 0.7 % SOLN Place 1 drop into both eyes daily as needed. (Patient taking differently: Place 1 drop into both eyes daily as needed (seasonal allergies). ) 1 Bottle 5  . Oxcarbazepine (TRILEPTAL) 300 MG tablet TAKE 1 TABLET BY MOUTH EVERY MORNING AND 2 TABLETS EVERY NIGHT AT BEDTIME FOR 30 DAYS 90 tablet 1  . oxybutynin (DITROPAN-XL) 10 MG 24 hr tablet Take 10 mg by mouth daily.     Marland Kitchen oxyCODONE-acetaminophen (PERCOCET) 10-325 MG tablet Take 1 tablet by mouth 2 (two) times daily as needed for pain.     Marland Kitchen OZEMPIC, 0.25 OR 0.5 MG/DOSE, 2 MG/1.5ML SOPN INJECT 0.25 MG ONCE WEEKLY FOR 4 WEEKS THEN .5 MG WEEKLY    . pantoprazole (PROTONIX) 40 MG tablet Take 1 tablet (40 mg total) by mouth daily. Take 30-60 min before first meal of the day 30 tablet 2  . potassium chloride SA (K-DUR) 20 MEQ tablet Take 20 mEq by mouth daily.    . pravastatin (PRAVACHOL) 20 MG tablet Take 20 mg by mouth at bedtime.     . pregabalin (LYRICA) 100 MG capsule Take 100 mg by mouth 3 (three) times daily.    . promethazine-dextromethorphan (PROMETHAZINE-DM) 6.25-15 MG/5ML syrup TK 5 ML PO Q 6 H FOR 7 DAYS PRN    . Respiratory Therapy Supplies (NEBULIZER) DEVI Use as directed with nebulizer solution. 1 each 1  . Respiratory Therapy Supplies (NEBULIZER/TUBING/MOUTHPIECE) KIT Use as directed with nebulizer machine 1 kit 12  . sitaGLIPtin-metformin (JANUMET) 50-1000 MG tablet Take 1 tablet by mouth 2 (two) times daily with a meal.    . SYMBICORT 160-4.5 MCG/ACT inhaler Inhale 2 puffs into the lungs 2 (two) times daily. 1 Inhaler 0  . zolpidem (AMBIEN) 10 MG tablet Take 1/2-1 tab 90 tablet 1  . Suvorexant (BELSOMRA) 15 MG TABS Take 15 mg by mouth at bedtime for 9 days. Start 20 mg po QHS if ineffective and no tolerability issues. 9 tablet 0   Facility-Administered Medications Prior to Visit  Medication Dose Route Frequency Provider Last Rate Last Dose  .  omalizumab Arvid Right) injection 375 mg  375 mg Subcutaneous Q14 Days Kennith Gain, MD   375 mg at 02/12/19 1510    PAST MEDICAL HISTORY: Past Medical History:  Diagnosis Date  . Allergic rhinoconjunctivitis   . Allergy history unknown   . Anxiety   . Arthritis    DDD, spondylosis  . Asthma   . Chronic back pain    Has had several surgeries.  He uses a walker  .  Chronic kidney disease    renal calculi  . Depression   . Diabetes mellitus without complication ([place])    dx in 2007  . Diabetes mellitus, type II ([place])   . Eczema   . Headache(784.0)    sinus related   . History of kidney stones   . Hx of pulmonary embolus 2017  . Hyperlipidemia   . Hypertension   . Insomnia   . Knee pain   . Left ankle pain   . Memory change   . Mental disorder   . Mood disorder ([place])   . OSA on CPAP    PSG 05/24/2016: AHI 17/hour 02 mean 76%.  HSAT 06/13/17, AHI 11-hour 02 mean 79%  . Recurrent upper respiratory infection (URI)   . Sleep apnea   . Tremor of both hands     PAST SURGICAL HISTORY: Past Surgical History:  Procedure Laterality Date  . ABDOMINAL HYSTERECTOMY    . BACK SURGERY     2012- lumbar fusion  . BREAST SURGERY     L cyst removed - 1972  . BUNIONECTOMY Left    Great toe  . calf -R- cyst removed    . CARPAL TUNNEL RELEASE     both hands   . HAMMER TOE SURGERY Left    L foot  . NASAL SINUS SURGERY    . NM MYOVIEW LTD  12/2008   Normal study.  Normal LV function.  EF 61%.  Apical thinning but no ischemia or infarction.  Marland Kitchen SHOULDER ARTHROSCOPY Left    R shoulder- RCR  . TOE SURGERY    . TOTAL KNEE ARTHROPLASTY Right 05/22/2017   Procedure: TOTAL KNEE ARTHROPLASTY;  Surgeon: Frederik Pear, MD;  Location: [facility];  Service: Orthopedics;  Laterality: Right;  . TRANSTHORACIC ECHOCARDIOGRAM  06/2009    Technically difficult.  Moderate concentric LVH with normal LV function.  No regional wall motion normalities.  EF> 55%.  Normal right ventricle.  No valve lesions.   Normal filling pressures.  Marland Kitchen TRIGGER FINGER RELEASE     L thumb  . TUBAL LIGATION      FAMILY HISTORY: Family History  Problem Relation Age of Onset  . Diabetes Sister   . Diabetes Brother   . Hypertension Brother   . Cancer Maternal Grandmother   . Heart failure Maternal Grandmother   . Hypertension Paternal Grandmother   . Asthma Daughter   . Cancer Daughter   . Depression Daughter   . Hypertension Daughter   . Heart failure Maternal Aunt   . Pneumonia Mother   . Diabetes Mother   . Alcoholism Father   . Alcohol abuse Father   . Depression Daughter   . Schizophrenia Grandchild   . Allergies Neg Hx   . Eczema Neg Hx   . Immunodeficiency Neg Hx     SOCIAL HISTORY: Social History   Socioeconomic History  . Marital status: Single    Spouse name: Not on file  . Number of children: 2  . Years of education: Not on file  . Highest education level: Some college, no degree  Occupational History    Comment: NA  Social Needs  . Financial resource strain: Not on file  . Food insecurity    Worry: Not on file    Inability: Not on file  . Transportation needs    Medical: Not on file    Non-medical: Not on file  Tobacco Use  . Smoking status: Passive Smoke Exposure - Never Smoker  .  Smokeless tobacco: Never Used  . Tobacco comment: grandson smokes, but in his car  Substance and Sexual Activity  . Alcohol use: Yes    Comment: rare use  . Drug use: No  . Sexual activity: Not Currently  Lifestyle  . Physical activity    Days per week: Not on file    Minutes per session: Not on file  . Stress: Not on file  Relationships  . Social Herbalist on phone: Not on file    Gets together: Not on file    Attends religious service: Not on file    Active member of club or organization: Not on file    Attends meetings of clubs or organizations: Not on file    Relationship status: Not on file  . Intimate partner violence    Fear of current or ex partner: Not on file     Emotionally abused: Not on file    Physically abused: Not on file    Forced sexual activity: Not on file  Other Topics Concern  . Not on file  Social History Narrative   She is currently single.  Has 2 girls.  Has some college education.  Currently disabled due to chronic back pain and not employed.   07/01/19 lives with brother Shanon Brow   Caffeine, none     PHYSICAL EXAM  GENERAL EXAM/CONSTITUTIONAL: Vitals:  Vitals:   07/01/19 0816  BP: (!) 166/93  Pulse: 79  Temp: (!) 97.5 F (36.4 C)  Weight: 189 lb 6.4 oz (85.9 kg)  Height: 5' 4"  (1.626 m)     Body mass index is 32.51 kg/m. Wt Readings from Last 3 Encounters:  07/01/19 189 lb 6.4 oz (85.9 kg)  06/05/19 191 lb (86.6 kg)  04/28/19 192 lb (87.1 kg)     Patient is in no distress; well developed, nourished and groomed; neck is supple  CARDIOVASCULAR:  Examination of carotid arteries is normal; no carotid bruits  Regular rate and rhythm, no murmurs  Examination of peripheral vascular system by observation and palpation is normal  EYES:  Ophthalmoscopic exam of optic discs and posterior segments is normal; no papilledema or hemorrhages  No exam data present  MUSCULOSKELETAL:  Gait, strength, tone, movements noted in Neurologic exam below  NEUROLOGIC: MENTAL STATUS:  MMSE - Mini Mental State Exam 07/01/2019  Orientation to time 5  Orientation to Place 4  Registration 1  Attention/ Calculation 2  Recall 3  Language- name 2 objects 2  Language- repeat 0  Language- follow 3 step command 2  Language- read & follow direction 1  Write a sentence 1  Copy design 0  Total score 21    awake, alert, oriented to person, place and time  recent and remote memory intact  normal attention and concentration  language fluent, comprehension intact, naming intact  fund of knowledge appropriate  CRANIAL NERVE:   2nd - no papilledema on fundoscopic exam  2nd, 3rd, 4th, 6th - pupils equal and reactive to  light, visual fields full to confrontation, extraocular muscles intact, no nystagmus  5th - facial sensation symmetric  7th - facial strength symmetric  8th - hearing intact  9th - palate elevates symmetrically, uvula midline  11th - shoulder shrug symmetric  12th - tongue protrusion midline  MOTOR:   normal bulk and tone, full strength in the BUE, BLE; EXCEPT DECR IN RIGHT HAND GRIP  SENSORY:   normal and symmetric to light touch, temperature, vibration; DECR IN RIGHT  LEG  COORDINATION:   finger-nose-finger, fine finger movements normal  REFLEXES:   deep tendon reflexes TRACE and symmetric  GAIT/STATION:   SLOW GAIT; IN ROLLATOR WALKER     DIAGNOSTIC DATA (LABS, IMAGING, TESTING) - I reviewed patient records, labs, notes, testing and imaging myself where available.  Lab Results  Component Value Date   WBC 5.4 04/28/2019   HGB 12.8 04/28/2019   HCT 41.1 04/28/2019   MCV 83.5 04/28/2019   PLT 233 04/28/2019      Component Value Date/Time   NA 136 04/28/2019 1712   NA 134 (A) 05/23/2017   K 3.7 04/28/2019 1712   CL 101 04/28/2019 1712   CO2 25 04/28/2019 1712   GLUCOSE 269 (H) 04/28/2019 1712   BUN 15 04/28/2019 1712   BUN 14 05/23/2017   CREATININE 0.71 04/28/2019 1712   CALCIUM 9.5 04/28/2019 1712   PROT 7.3 04/28/2019 1712   ALBUMIN 4.2 04/28/2019 1712   AST 21 04/28/2019 1712   ALT 30 04/28/2019 1712   ALKPHOS 68 04/28/2019 1712   BILITOT 0.5 04/28/2019 1712   GFRNONAA >60 04/28/2019 1712   GFRAA >60 04/28/2019 1712   No results found for: CHOL, HDL, LDLCALC, LDLDIRECT, TRIG, CHOLHDL Lab Results  Component Value Date   HGBA1C 8.2 (H) 05/22/2017   No results found for: VITAMINB12 No results found for: TSH   04/28/19 CT head [I reviewed images myself and agree with interpretation. -VRP]  1. No acute intracranial abnormality. 2. Atrophy.    ASSESSMENT AND PLAN  68 y.o. year old female here with memory loss, headache, tremor, right  arm weakness ever since fall in May 2020.  May represent postconcussion syndrome.  Will check MRI of the brain to rule out stroke or other secondary cause.  Dx:  1. Post concussion syndrome   2. Right hand weakness   3. Tremor of right hand   4. Memory loss   5. Chronic tension-type headache, not intractable     PLAN:  Orders Placed This Encounter  Procedures  . MR BRAIN WO CONTRAST   Return pending test results, for pending if symptoms worsen or fail to improve.    Penni Bombard, MD 3/73/4287, 6:81 AM Certified in Neurology, Neurophysiology and Neuroimaging  [facility] [address], [place], [place] [address] [phone]

## 2019-07-01 NOTE — Telephone Encounter (Signed)
[employer] medicare/medicaid order sent to [facility]. No auth they will reach out to the patient to schedule.  °

## 2019-07-04 ENCOUNTER — Ambulatory Visit (INDEPENDENT_AMBULATORY_CARE_PROVIDER_SITE_OTHER): Payer: 59 | Admitting: Psychiatry

## 2019-07-04 ENCOUNTER — Other Ambulatory Visit: Payer: Self-pay

## 2019-07-04 DIAGNOSIS — F419 Anxiety disorder, unspecified: Secondary | ICD-10-CM | POA: Diagnosis not present

## 2019-07-04 NOTE — Progress Notes (Signed)
      [facility] Counselor/Therapist Progress Note  Patient ID: Chelsea Benson, MRN: [id number],    Date: 07/04/2019  Time Spent: 60 minutes    7:45am to 8:45am  Treatment Type: Individual Therapy  Reported Symptoms:    anxiety and frustration, some depression, more stressed "due to pandemic and her health issues" (is being followed by "diabetic doctor;primary care, name uncertain")  New medications or health issues since last appt:    Has an appt with neurologist July 27th.  Sees nutritionist Aug. 20th re: diabetes. Pending back surgery, doesn't know date yet (will have in-home assistance and PT)  *Did get a Life Alert button to wear in case she should ever have an emergency at home alone and need medical attention. Is wearing it today.   Mental Status Exam:  Appearance:   casual  Behavior:  Appropriate and Sharing  Motor:  Normal  Speech/Language:   Normal Rate  Affect:  anxious  Mood:  anxious, frustrated  Thought process:  normal  Thought content:    WNL  Sensory/Perceptual disturbances:    WNL  Orientation:  oriented to person, place, time/date, situation, day of week, month of year and year  Attention:  Good  Concentration:  Good  Memory:  WNL  Fund of knowledge:   Good  Insight:    Fair  Judgment:   Good  Impulse Control:  Fair   Risk Assessment: Danger to Self:  No Self-injurious Behavior: No Danger to Others: No Duty to Warn:no Physical Aggression / Violence:No  Access to Firearms a concern: No  Gang Involvement:No   Subjective:  Patent today reporting symptoms noted above and is having additional stress mostly due to multiple health issues and the pandemic.  Brother continues to live in her home and is still working as weekend Training and development officer at [employer] facility. Still frustrated with him that he is not as careful as she is re: virus protections, and I encouraged patient to continue to closely follow all the recommended precautions. Is concerned that "I  forget things more now" such as little things sometimes and also my jewelry at times".  States she has spoken with her neurologist about this. Doesn't seem as "shaky" today and appears to have more energy. Talked through the multiple stressors mentioned above and seemed to be calmer and less anxious after venting most of session. Reviewed coping strategies to help her manage anxieties, stress, and frustrations, and encouraged good self-care to continue.    Diagnosis:   ICD-10-CM   1. Anxiety disorder, unspecified type  F41.9     Plan:  Patient to follow through in using the strategies discussed today, remembering when she gets anxious, angry or frustrated, to be mindful of what is and is not in her control, and really think about what would be the best response for her. Also to try some strategies in helping her keep up with things due to difficulty remembering where she puts them.  Reviewed positive self-talk today and she was able to give clear examples as we spoke. Next appt within 2-3 wks.   Shanon Ace, LCSW

## 2019-07-08 ENCOUNTER — Other Ambulatory Visit: Payer: Self-pay

## 2019-07-08 ENCOUNTER — Encounter: Payer: Self-pay | Admitting: Internal Medicine

## 2019-07-08 ENCOUNTER — Ambulatory Visit (INDEPENDENT_AMBULATORY_CARE_PROVIDER_SITE_OTHER): Payer: 59 | Admitting: Internal Medicine

## 2019-07-08 DIAGNOSIS — J45991 Cough variant asthma: Secondary | ICD-10-CM

## 2019-07-08 NOTE — Patient Instructions (Signed)
Work on inhaler technique:  relax and gently blow all the way out then take a nice smooth deep breath back in, triggering the inhaler at same time you start breathing in.  Hold for up to 5 seconds if you can. Blow out thru nose. Rinse and gargle with water when done.  If you are satisfied with your treatment plan,  let your doctor know and he/she can either refill your medications or you can return here when your prescription runs out.     If in any way you are not 100% satisfied,  please tell us.  If 100% better, tell your friends!  Pulmonary follow up is as needed   

## 2019-07-08 NOTE — Progress Notes (Signed)
Chelsea Benson, female    DOB: May 14, 1951     MRN: [id number]   Brief patient profile:  83 yobf never smoker asthma from childhood on shots in 50's> no better  and started nucala June 5th 2020 per Nelva Bush with chronic cough Teryl Lucy always some better p prednisone referred to pulmonary clinic 06/05/2019 by Dr   Sabra Heck for refractory cough.      History of Present Illness  06/05/2019  Pulmonary/ 1st office eval/Bearl Talarico  Chief Complaint  Patient presents with  . Pulmonary Consult    Referred by Kathyrn Lass for eval of Asthma.   Dyspnea:  Limited by back more than breathing  Cough: 24/7 much more day than night and dry/hacking  Sleep: on side prop up on pillows x 2  SABA use: rarely / nebulizer helps more rec Stop spiriva and powdered albuterol (respiclick)  Plan A = Automatic =  symbicort 160 Take 2 puffs first thing in am and then another 2 puffs about 12 hours later.  Work on inhaler technique:   Plan B = Backup Only use your albuterol nebulizer  as a rescue medication  Pantoprazole (protonix) 40 mg   Take  30-60 min before first meal of the day and Pepcid (famotidine)  20 mg one after supper until return to office  GERD diet  Please schedule a follow up office visit in 4 weeks, sooner if needed  with all medications /inhalers/ solutions in hand so we can verify exactly what you are taking. This includes all medications from all doctors and over the counters     07/08/2019  f/u ov/Liala Codispoti re: uacs vs cough variant maint on symb 160 2bid  Chief Complaint  Patient presents with  . Follow-up    Breathing is overall doing well. She is having some wheezing. She is using her neb 3 x per day.   Dyspnea:  Better p stopped powdered inhalers  Cough: better but around lunch feels need for  neb > knocks it out Sleeping: snores > sleep eval 8/4 scheduled SABA use: way too much  02: none    No obvious day to day or daytime variability or assoc excess/ purulent sputum or mucus plugs or hemoptysis  or cp or chest tightness, subjective wheeze or overt sinus or hb symptoms.    Also denies any obvious fluctuation of symptoms with weather or environmental changes or other aggravating or alleviating factors except as outlined above   No unusual exposure hx or h/o childhood pna  or knowledge of premature birth.  Current Allergies, Complete Past Medical History, Past Surgical History, Family History, and Social History were reviewed in [employer] record.  ROS  The following are not active complaints unless bolded Hoarseness, sore throat, dysphagia, dental problems, itching, sneezing,  nasal congestion or discharge of excess mucus or purulent secretions, ear ache,   fever, chills, sweats, unintended wt loss or wt gain, classically pleuritic or exertional cp,  orthopnea pnd or arm/hand swelling  or leg swelling, presyncope, palpitations, abdominal pain, anorexia, nausea, vomiting, diarrhea  or change in bowel habits or change in bladder habits, change in stools or change in urine, dysuria, hematuria,  rash, arthralgias, visual complaints, headache, numbness, weakness or ataxia or problems with walking or coordination,  change in mood or  memory.        Current Meds  Medication Sig  . ACCU-CHEK GUIDE test strip TEST BID PRN  . albuterol (PROVENTIL) (2.5 MG/3ML) 0.083% nebulizer solution Take 3 mLs (  2.5 mg total) by nebulization every 4 (four) hours as needed for wheezing or shortness of breath.  . ARIPiprazole (ABILIFY) 2 MG tablet Take 1 tablet (2 mg total) by mouth daily.  Marland Kitchen aspirin EC 81 MG tablet Take 81 mg by mouth daily.  Marland Kitchen azelastine (ASTELIN) 0.1 % nasal spray Place 1 spray into both nostrils 2 (two) times daily.  . Blood Glucose Monitoring Suppl (ACCU-CHEK GUIDE) w/Device KIT See admin instructions.  . busPIRone (BUSPAR) 15 MG tablet Take 1 tablet (15 mg total) by mouth 3 (three) times daily.  Marland Kitchen CLOBETASOL PROPIONATE E 0.05 % emollient cream Apply 1 application  topically 2 (two) times daily as needed (rash).   Marland Kitchen diltiazem (CARDIZEM CD) 180 MG 24 hr capsule Take 1 capsule by mouth daily.  Marland Kitchen EPINEPHrine (EPIPEN 2-PAK) 0.3 mg/0.3 mL IJ SOAJ injection Use as directed for severe allergic reaction (Patient taking differently: Inject 0.3 mg into the muscle once as needed for anaphylaxis (severe allergic reaction). )  . famotidine (PEPCID) 20 MG tablet One after supper  . fexofenadine (ALLEGRA) 180 MG tablet Take 180 mg by mouth daily.  . fluticasone (FLONASE) 50 MCG/ACT nasal spray Use 1-2 sprays in each nostril in each nostril once daily (Patient taking differently: Place 1 spray into both nostrils 2 (two) times a day. )  . hydrochlorothiazide (HYDRODIURIL) 25 MG tablet Take 25 mg by mouth daily.   . hydrOXYzine (ATARAX/VISTARIL) 25 MG tablet TAKE 1 TABLET(25 MG) BY MOUTH EVERY 6 HOURS AS NEEDED  . Mepolizumab (NUCALA) 100 MG/ML SOAJ Inject 100 mg into the skin every 30 (thirty) days.  . metFORMIN (GLUCOPHAGE-XR) 500 MG 24 hr tablet Take 1 tablet by mouth 2 (two) times a day.  . mirtazapine (REMERON) 30 MG tablet TAKE 1 TABLET(30 MG) BY MOUTH AT BEDTIME  . montelukast (SINGULAIR) 10 MG tablet Take 1 tablet (10 mg total) by mouth at bedtime.  . Olopatadine HCl (PAZEO) 0.7 % SOLN Place 1 drop into both eyes daily as needed. (Patient taking differently: Place 1 drop into both eyes daily as needed (seasonal allergies). )  . Oxcarbazepine (TRILEPTAL) 300 MG tablet TAKE 1 TABLET BY MOUTH EVERY MORNING AND 2 TABLETS EVERY NIGHT AT BEDTIME FOR 30 DAYS  . oxybutynin (DITROPAN-XL) 10 MG 24 hr tablet Take 10 mg by mouth daily.   Marland Kitchen oxyCODONE-acetaminophen (PERCOCET) 10-325 MG tablet Take 1 tablet by mouth 2 (two) times daily as needed for pain.   Marland Kitchen OZEMPIC, 0.25 OR 0.5 MG/DOSE, 2 MG/1.5ML SOPN INJECT 0.25 MG ONCE WEEKLY FOR 4 WEEKS THEN .5 MG WEEKLY  . pantoprazole (PROTONIX) 40 MG tablet Take 1 tablet (40 mg total) by mouth daily. Take 30-60 min before first meal of the  day  . potassium chloride SA (K-DUR) 20 MEQ tablet Take 20 mEq by mouth daily.  . pravastatin (PRAVACHOL) 20 MG tablet Take 20 mg by mouth at bedtime.   . pregabalin (LYRICA) 100 MG capsule Take 100 mg by mouth 3 (three) times daily.  Marland Kitchen Respiratory Therapy Supplies (NEBULIZER) DEVI Use as directed with nebulizer solution.  Marland Kitchen Respiratory Therapy Supplies (NEBULIZER/TUBING/MOUTHPIECE) KIT Use as directed with nebulizer machine  . SYMBICORT 160-4.5 MCG/ACT inhaler Inhale 2 puffs into the lungs 2 (two) times daily.  Marland Kitchen zolpidem (AMBIEN) 10 MG tablet Take 1/2-1 tab        Past Medical History:  Diagnosis Date  . Allergic rhinoconjunctivitis   . Allergy history unknown   . Anxiety   . Arthritis  DDD, spondylosis  . Asthma   . Chronic back pain    Has had several surgeries.  He uses a walker  . Chronic kidney disease    renal calculi  . Depression   . Diabetes mellitus without complication ([place])    dx in 2007  . Diabetes mellitus, type II ([place])   . Eczema   . Headache(784.0)    sinus related   . History of kidney stones   . Hx of pulmonary embolus 2017  . Hyperlipidemia   . Hypertension   . Insomnia   . Knee pain   . Left ankle pain   . Mental disorder   . Mood disorder ([place])   . OSA on CPAP    PSG 05/24/2016: AHI 17/hour 02 mean 76%.  HSAT 06/13/17, AHI 11-hour 02 mean 79%  . Recurrent upper respiratory infection (URI)   . Sleep apnea       Objective:    amb obese bf nad using rollator   Wt Readings from Last 3 Encounters:  07/08/19 186 lb 3.2 oz (84.5 kg)  07/01/19 189 lb 6.4 oz (85.9 kg)  06/05/19 191 lb (86.6 kg)     Vital signs reviewed - Note on arrival 02 sats  97% on RA      HEENT: nl dentition, turbinates bilaterally, and oropharynx. Nl external ear canals without cough reflex   NECK :  without JVD/Nodes/TM/ nl carotid upstrokes bilaterally   LUNGS: no acc muscle use,  Nl contour chest which is clear to A and P bilaterally without cough on insp or exp  maneuvers   CV:  RRR  no s3 or murmur or increase in P2, and no edema   ABD: Obese/  soft and nontender with nl inspiratory excursion in the supine position. No bruits or organomegaly appreciated, bowel sounds nl  MS:  Nl gait/ ext warm without deformities, calf tenderness, cyanosis or clubbing No obvious joint restrictions   SKIN: warm and dry without lesions    NEURO:  alert, approp, nl sensorium with  no motor or cerebellar deficits apparent.              .    Assessment

## 2019-07-09 ENCOUNTER — Encounter: Payer: Self-pay | Admitting: Internal Medicine

## 2019-07-09 NOTE — Assessment & Plan Note (Signed)
Onset in childhood -  Spirometry 06/05/2019  FEV1 1.07 (55%)  Ratio 0.74 though f/v not really physiologic  - placed on nucala May 10 2019  - 06/05/2019    try consolidate rx with symb 160/ prn neb and d/c dpis = spiriva/ respiclick hfa  - 0/05/2693  After extensive coaching inhaler device,  effectiveness =   50%   She is clearly better off the DPI's despite the fact that she only gets about half the [facility] inhaled correctly.  She needs to work harder on this or use a spacer but is already under the care of Dr. Nelva Bush on new Calla which I think is appropriate here based on a trial basis.  Since she has follow-up with an allergist pulmonary follow-up can be arranged as needed.   I had an extended discussion with the patient reviewing all relevant studies completed to date and  lasting 15 to 20 minutes of a 25 minute summary final office visit    See device teaching which extended face to face time for this visit.  Each maintenance medication was reviewed in detail including emphasizing most importantly the difference between maintenance and prns and under what circumstances the prns are to be triggered using an action plan format that is not reflected in the computer generated alphabetically organized AVS which I have not found useful in most complex patients, especially with respiratory illnesses  Please see AVS for specific instructions unique to this visit that I personally wrote and verbalized to the the pt in detail and then reviewed with pt  by my nurse highlighting any  changes in therapy recommended at today's visit to their plan of care.

## 2019-07-10 ENCOUNTER — Other Ambulatory Visit: Payer: Self-pay | Admitting: Family Medicine

## 2019-07-14 ENCOUNTER — Other Ambulatory Visit: Payer: Self-pay | Admitting: Allergy

## 2019-07-14 DIAGNOSIS — J4540 Moderate persistent asthma, uncomplicated: Secondary | ICD-10-CM

## 2019-07-15 MED ORDER — PAZEO 0.7 % OP SOLN: 1.0000 [drp] | Freq: Every day | OPHTHALMIC | 1 refills | Status: DC | PRN

## 2019-07-15 MED ORDER — MONTELUKAST SODIUM 10 MG PO TABS: 10.0000 mg | ORAL_TABLET | Freq: Every day | ORAL | 0 refills | Status: DC

## 2019-07-15 NOTE — Addendum Note (Signed)
Addended by: Orlene Erm on: 07/15/2019 10:32 AM   Modules accepted: Orders

## 2019-07-17 ENCOUNTER — Ambulatory Visit (INDEPENDENT_AMBULATORY_CARE_PROVIDER_SITE_OTHER): Payer: 59 | Admitting: Psychiatry

## 2019-07-17 ENCOUNTER — Other Ambulatory Visit: Payer: Self-pay

## 2019-07-17 DIAGNOSIS — F3181 Bipolar II disorder: Secondary | ICD-10-CM

## 2019-07-17 HISTORY — PX: OTHER SURGICAL HISTORY: SHX169

## 2019-07-17 NOTE — Progress Notes (Signed)
[facility] Counselor/Therapist Progress Note  Patient ID: Chelsea Benson, MRN: [id number],    Date: 07/17/2019  Time Spent: 60 minutes    9:00am to 10:00am   Virtual Visit  Note Connected with patient by a video enabled telemedicine/telehealth application or telephone, with their informed consent, and verified patient privacy and that I am speaking with the correct person using two identifiers. I discussed the limitations, risks, security and privacy concerns of performing psychotherapy and management service by telephone and the availability of in person appointments. I also discussed with the patient that there may be a patient responsible charge related to this service. The patient expressed understanding and agreed to proceed. I discussed the treatment planning with the patient. The patient was provided an opportunity to ask questions and all were answered. The patient agreed with the plan and demonstrated an understanding of the instructions. The patient was advised to call  our office if  symptoms worsen or feel they are in a crisis state and need immediate contact.   Therapist Location: [facility] Patient Location: home   Treatment Type: Individual Therapy  Reported Symptoms:    anxiety and frustration, physical pain with her backsome depression, more stressed "due to pandemic and her health issues" (is being followed by "diabetic doctor;primary care, name uncertain")   New medications or health issues since last appt:    Back surgery pending.  To see a neurologist prior to surgery being scheduled.  Reports no med changes since last appt here.   Mental Status Exam:  Appearance:   casual  Behavior:  Appropriate and Sharing  Motor:  Normal  Speech/Language:   Normal Rate  Affect:  anxious  Mood:  anxious, frustrated  Thought process:  normal  Thought content:    WNL  Sensory/Perceptual disturbances:    WNL  Orientation:  oriented to person, place,  time/date, situation, day of week, month of year and year  Attention:  Good  Concentration:  Good  Memory:  WNL  Fund of knowledge:   Good  Insight:    Fair  Judgment:   Good  Impulse Control:  Fair   Risk Assessment: Danger to Self:  No Self-injurious Behavior: No Danger to Others: No Duty to Warn:no Physical Aggression / Violence:No  Access to Firearms a concern: No  Gang Involvement:No   Subjective:  Patent today had to change her appt here  to teletherapy due to her back pain intensifying. Hurts more when she is up and moving around. Discouraged with her pain and reports she is taking her pain meds as prescribed and gets some relief for a while. Brother continues to live in her home and she shares that he is not helpful in supporting her.  Also frustrated with him that he is not as careful as she is re: virus protections, and I encouraged patient to continue to closely follow all the recommended precautions. Discussed the stressors cited above, in addition to her current back pain. Vented freely her frustrations and some fears regarding her physical situation. Reviewed coping strategies to help her manage anxieties, stress, and frustrations, and encouraged good self-care to continue, as well as accepting some extra help right now to help get her needs mets.   Diagnosis:   ICD-10-CM   1. Bipolar II disorder ([place])  F31.81     Plan:  Patient to follow through in using the strategies discussed today, remembering when she gets anxious, angry or frustrated, to be mindful of what is  and is not in her control, and really think about what would be the best response for her. Also to try some strategies in helping her keep up with things due to difficulty remembering where she puts them.  Reviewed positive self-talk today and she was able to give clear examples as we spoke. Next appt within 2-3 wks.   Chelsea Fareeborah Alvie Fowles, LCSW

## 2019-07-19 ENCOUNTER — Other Ambulatory Visit: Payer: Self-pay | Admitting: *Deleted

## 2019-07-19 ENCOUNTER — Other Ambulatory Visit: Payer: Self-pay

## 2019-07-19 ENCOUNTER — Telehealth: Payer: Self-pay | Admitting: Psychiatry

## 2019-07-19 ENCOUNTER — Other Ambulatory Visit: Payer: Self-pay | Admitting: Psychiatry

## 2019-07-19 DIAGNOSIS — F419 Anxiety disorder, unspecified: Secondary | ICD-10-CM

## 2019-07-19 DIAGNOSIS — F3181 Bipolar II disorder: Secondary | ICD-10-CM

## 2019-07-19 DIAGNOSIS — F5101 Primary insomnia: Secondary | ICD-10-CM

## 2019-07-19 MED ORDER — FLUTICASONE PROPIONATE 50 MCG/ACT NA SUSP: NASAL | 1 refills | Status: DC

## 2019-07-19 MED ORDER — OXCARBAZEPINE 300 MG PO TABS: ORAL_TABLET | ORAL | 1 refills | Status: DC

## 2019-07-19 NOTE — Telephone Encounter (Signed)
Patient need refill on Oxcarbazepine 150 mg. to be sent to [facility] on [address] patient stated the pharmacy says it was a problem with the provider submitting request.

## 2019-07-19 NOTE — Telephone Encounter (Signed)
Refills submitted again

## 2019-07-25 ENCOUNTER — Encounter: Payer: Self-pay | Admitting: Dietician

## 2019-07-25 ENCOUNTER — Other Ambulatory Visit: Payer: Self-pay

## 2019-07-25 ENCOUNTER — Encounter: Payer: 59 | Attending: Family Medicine | Admitting: Dietician

## 2019-07-25 DIAGNOSIS — E119 Type 2 diabetes mellitus without complications: Secondary | ICD-10-CM | POA: Diagnosis not present

## 2019-07-25 NOTE — Progress Notes (Signed)
Diabetes Self-Management Education  Visit Type: First/Initial  Appt. Start Time: 9:30am Appt. End Time: 10:30am  07/25/2019  Ms. Chelsea Benson, identified by name and date of birth, is a 68 y.o. female with a diagnosis of Diabetes: Type 2.   ASSESSMENT   Diabetes Self-Management Education - 07/25/19 0939      Visit Information   Visit Type  First/Initial      Initial Visit   Diabetes Type  Type 2    Are you currently following a meal plan?  No    Are you taking your medications as prescribed?  Yes    Date Diagnosed  age 68 (about 13 years ago, 2007)      Health Coping   How would you rate your overall health?  Fair      Psychosocial Assessment   Patient Belief/Attitude about Diabetes  Motivated to manage diabetes    Self-care barriers  None    Self-management support  Doctor's office;Family    Other persons present  Patient    Patient Concerns  Nutrition/Meal planning;Glycemic Control    Special Needs  None    Preferred Learning Style  No preference indicated    Learning Readiness  Change in progress    How often do you need to have someone help you when you read instructions, pamphlets, or other written materials from your doctor or pharmacy?  1 - Never    What is the last grade level you completed in school?  some college      Complications   Last HgB A1C per patient/outside source  10.5 %   05/02/2019   How often do you check your blood sugar?  1-2 times/day    Fasting Blood glucose range (mg/dL)  [phone]    Postprandial Blood glucose range (mg/dL)  [phone]    Have you had a dilated eye exam in the past 12 months?  Yes    Have you had a dental exam in the past 12 months?  Yes    Are you checking your feet?  Yes    How many days per week are you checking your feet?  7      Dietary Intake   Breakfast  7:30am scrambled eggs + 1 piece bacon + 1 piece toast   (+ cereal sometimes)   Snack (morning)  9am apple;  11:00am peanut butter + crackers   or Minute Maid juice    Lunch  2:00pm 1/2 sandwich (tuna, or chicken, or ham) + applesauce   ~30g carbs    Dinner  5:00pm 3 oz meat (usually chicken breast) + collard greens + 1 c pinto beans    Beverage(s)  water, Min[place]id juice or soda (to correct low blood sugar)      Exercise   Exercise Type  ADL's;Light (walking / raking leaves)    How many days per week to you exercise?  4    How many minutes per day do you exercise?  20    Total minutes per week of exercise  80      Patient Education   Previous Diabetes Education  No    Disease state   Definition of diabetes, type 1 and 2, and the diagnosis of diabetes;Explored patient's options for treatment of their diabetes;Factors that contribute to the development of diabetes    Nutrition management   Role of diet in the treatment of diabetes and the relationship between the three main macronutrients and blood glucose level;Food label reading, portion sizes  and measuring food.;Carbohydrate counting;Reviewed blood glucose goals for pre and post meals and how to evaluate the patients' food intake on their blood glucose level.;Meal timing in regards to the patients' current diabetes medication.;Meal options for control of blood glucose level and chronic complications.    Physical activity and exercise   Role of exercise on diabetes management, blood pressure control and cardiac health.;Helped patient identify appropriate exercises in relation to his/her diabetes, diabetes complications and other health issue.    Medications  Reviewed patients medication for diabetes, action, purpose, timing of dose and side effects.    Monitoring  Taught/evaluated SMBG meter.;Purpose and frequency of SMBG.;Taught/discussed recording of test results and interpretation of SMBG.;Interpreting lab values - A1C, lipid, urine microalbumina.;Identified appropriate SMBG and/or A1C goals.;Daily foot exams;Yearly dilated eye exam    Acute complications  Taught treatment of hypoglycemia - the 15  rule.;Discussed and identified patients' treatment of hyperglycemia.    Chronic complications  Relationship between chronic complications and blood glucose control;Assessed and discussed foot care and prevention of foot problems;Lipid levels, blood glucose control and heart disease;Identified and discussed with patient  current chronic complications;Dental care;Reviewed with patient heart disease, higher risk of, and prevention;Nephropathy, what it is, prevention of, the use of ACE, ARB's and early detection of through urine microalbumia.;Retinopathy and reason for yearly dilated eye exams;Applicable immunizations    Psychosocial adjustment  Worked with patient to identify barriers to care and solutions;Helped patient identify a support system for diabetes management;Identified and addressed patients feelings and concerns about diabetes;Brainstormed with patient on coping mechanisms for social situations, getting support from significant others, dealing with feelings about diabetes      Individualized Goals (developed by patient)   Nutrition  General guidelines for healthy choices and portions discussed    Physical Activity  Exercise 3-5 times per week;30 minutes per day    Medications  take my medication as prescribed    Monitoring   test my blood glucose as discussed   3x/day: fasting, 2 hours post-meals     Outcomes   Expected Outcomes  Demonstrated interest in learning. Expect positive outcomes    Future DMSE  PRN    Program Status  Completed       Individualized Plan for Diabetes Self-Management Training:  Learning Objective:  Patient will have a greater understanding of diabetes self-management. Patient education plan is to attend individual and/or group sessions per assessed needs and concerns.   Expected Outcomes:  Demonstrated interest in learning. Expect positive outcomes  Education material provided: ADA - How to Thrive: A Guide for Your Journey with Diabetes, A1C conversion  sheet, Meal plan card, My Plate, Snack sheet and Carbohydrate counting sheet  If problems or questions, patient to contact team via:  Phone and Email  Future DSME appointment: PRN

## 2019-07-30 ENCOUNTER — Other Ambulatory Visit: Payer: Self-pay

## 2019-07-30 ENCOUNTER — Ambulatory Visit (INDEPENDENT_AMBULATORY_CARE_PROVIDER_SITE_OTHER): Payer: 59 | Admitting: Psychiatry

## 2019-07-30 DIAGNOSIS — F3181 Bipolar II disorder: Secondary | ICD-10-CM | POA: Diagnosis not present
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
## 2019-08-05 ENCOUNTER — Ambulatory Visit
Admission: RE | Admit: 2019-08-05 | Discharge: 2019-08-05 | Disposition: A | Discharge status: Home / Self Care | Payer: 59 | Source: Ambulatory Visit | Attending: Diagnostic Neuroimaging | Admitting: Diagnostic Neuroimaging

## 2019-08-05 ENCOUNTER — Other Ambulatory Visit: Payer: Self-pay

## 2019-08-05 ENCOUNTER — Other Ambulatory Visit: Payer: Self-pay | Admitting: Neurosurgery

## 2019-08-05 DIAGNOSIS — R413 Other amnesia: Secondary | ICD-10-CM

## 2019-08-05 DIAGNOSIS — R412 Retrograde amnesia: Secondary | ICD-10-CM

## 2019-08-06 ENCOUNTER — Telehealth: Payer: Self-pay | Admitting: *Deleted

## 2019-08-06 ENCOUNTER — Telehealth: Payer: Self-pay | Admitting: Internal Medicine

## 2019-08-06 NOTE — Telephone Encounter (Signed)
Spoke with pt, advised her that MW wanted her to continue to take the Famotidine and pantoprazole until her next office visit. Nothing further is needed.Marland Kitchen

## 2019-08-06 NOTE — Telephone Encounter (Signed)
LVM informing the patient that her MRI brain results are unremarkable, no acute findings. Left number for any questions.

## 2019-08-06 NOTE — Telephone Encounter (Signed)
lmtcb There was no plan change at 07/08/19 visit. 06/05/19 Plan A = Automatic =  symbicort 160 Take 2 puffs first thing in am and then another 2 puffs about 12 hours later.  Work on inhaler technique:   Plan B = Backup Only use your albuterol nebulizer  as a rescue medication  Pantoprazole (protonix) 40 mg   Take  30-60 min before first meal of the day and Pepcid (famotidine)  20 mg one after supper until return to office  GERD diet  Please schedule a follow up office visit in 4 weeks, sooner if needed  with all medications /inhalers/ solutions in hand so we can verify exactly what you are taking. This includes all medications from all doctors and over the counters

## 2019-08-07 NOTE — Telephone Encounter (Signed)
Continue ibuprofen / tylenol as needed. -VRP

## 2019-08-07 NOTE — Telephone Encounter (Signed)
Pt returned call and stated that she is still getting her headaches and tremors. Please advise.

## 2019-08-07 NOTE — Telephone Encounter (Addendum)
Called patient who stated she is averaging a headache 3 days a week.  Ibuprofen doesn't give relief. If it's time she takes oxycodone for headache, but it only dulls headache.  She stated the hand tremors come and go.  She has gotten a squeeze ball to exercise her hands which has made them stronger for gripping. She wants to know what to do for her headaches. I advised will send to Dr Leta Baptist and call her back. She Patient verbalized understanding, appreciation.

## 2019-08-07 NOTE — Telephone Encounter (Signed)
LVM informing patient that Dr Leta Baptist recommends she try OTC medications at this time. I advised she stay well hydrated, maybe avoid being outdoors in heat. Left number for questions.

## 2019-08-13 ENCOUNTER — Other Ambulatory Visit: Payer: Self-pay

## 2019-08-13 ENCOUNTER — Encounter: Payer: Self-pay | Admitting: Psychiatry

## 2019-08-13 ENCOUNTER — Ambulatory Visit (INDEPENDENT_AMBULATORY_CARE_PROVIDER_SITE_OTHER): Payer: 59 | Admitting: Psychiatry

## 2019-08-13 DIAGNOSIS — F5101 Primary insomnia: Secondary | ICD-10-CM | POA: Diagnosis not present

## 2019-08-13 DIAGNOSIS — F419 Anxiety disorder, unspecified: Secondary | ICD-10-CM | POA: Diagnosis not present

## 2019-08-13 DIAGNOSIS — F3181 Bipolar II disorder: Secondary | ICD-10-CM | POA: Diagnosis not present

## 2019-08-13 MED ORDER — BUSPIRONE HCL 15 MG PO TABS: 22.5000 mg | ORAL_TABLET | Freq: Two times a day (BID) | ORAL | 0 refills | Status: DC

## 2019-08-13 MED ORDER — OXCARBAZEPINE 300 MG PO TABS: 600.0000 mg | ORAL_TABLET | Freq: Every day | ORAL | 1 refills | Status: DC

## 2019-08-13 NOTE — Progress Notes (Signed)
[facility] [phone] - [place], [place] - [phone] [facility] [address] [place] [address] Phone: [phone] Fax: [phone]  [facility] - [place], [place] - [address] [place] [address] Phone: [phone] Fax: [phone]  [facility] - [place], [place] - [address] [facility] [address] [place] [address] [place] [address] Phone: [phone] Fax: [phone]  [facility] - [place], [place] - [address] [place] [address] Phone: [phone] Fax: [phone]    Your procedure is scheduled on Wednesday, September 16th.  Report to [facility] Main Entrance "A" at 10:00 A.M., and check in at the Admitting office.  Call this number if you have problems the morning of surgery:  [phone]  Call [phone] if you have any questions prior to your surgery date Monday-Friday 8am-4pm   Remember:  Do not eat or drink after midnight the night before your surgery   Take these medicines the morning of surgery with A SIP OF WATER  ARIPiprazole (ABILIFY) azelastine (ASTELIN)/nasal spray busPIRone (BUSPAR)  diltiazem (CARDIZEM CD) fexofenadine (ALLEGRA) fluticasone (FLONASE)/nasal spray oxybutynin (DITROPAN-XL)  pantoprazole (PROTONIX)  pregabalin (LYRICA)   If needed - albuterol (PROVENTIL) (2.5 MG/3ML)/nebulizer, EPINEPHrine (EPIPEN 2-PAK), hydrOXYzine (ATARAX/VISTARIL), Olopatadine HCl (PAZEO)/eye drops, oxyCODONE-acetaminophen (PERCOCET),    Follow your surgeon's instructions on when to stop Aspirin.  If no instructions were given by your surgeon then you will need to call the office to get those instructions.    7 days prior to surgery STOP taking any Aspirin (unless otherwise instructed by your surgeon), Aleve, Naproxen, Ibuprofen, Motrin, Advil, Goody's, BC's, all herbal medications, fish oil, and all  vitamins.  How to Manage Your Diabetes  WHAT DO I DO ABOUT MY DIABETES MEDICATION?  Marland Kitchen Do not take metFORMIN (GLUCOPHAGE-XR)/oral diabetes medicines (pills) the morning of surgery.  . The day of surgery, do not take other diabetes injectables, including Byetta (exenatide), Bydureon (exenatide ER), Victoza (liraglutide), or Trulicity (dulaglutide).  Before and After Surgery  Why is it important to control my blood sugar before and after surgery? . Improving blood sugar levels before and after surgery helps healing and can limit problems. . A way of improving blood sugar control is eating a healthy diet by: o  Eating less sugar and carbohydrates o  Increasing activity/exercise o  Talking with your doctor about reaching your blood sugar goals . High blood sugars (greater than 180 mg/dL) can raise your risk of infections and slow your recovery, so you will need to focus on controlling your diabetes during the weeks before surgery. . Make sure that the doctor who takes care of your diabetes knows about your planned surgery including the date and location.  How do I manage my blood sugar before surgery? . Check your blood sugar at least 4 times a day, starting 2 days before surgery, to make sure that the level is not too high or low. o Check your blood sugar the morning of your surgery when you wake up and every 2 hours until you get to the Short Stay unit. . If your blood sugar is less than 70 mg/dL, you will need to treat for low blood sugar: o Do not take insulin. o Treat a low blood sugar (less than 70 mg/dL) with  cup of clear juice (cranberry or apple), 4 glucose tablets, OR glucose gel. Recheck blood sugar in 15 minutes after treatment (to make sure  it is greater than 70 mg/dL). If your blood sugar is not greater than 70 mg/dL on recheck, call [phone] o  for further instructions. . Report your blood sugar to the short stay nurse when you get to Short Stay.  . If you are admitted  to the hospital after surgery: o Your blood sugar will be checked by the staff and you will probably be given insulin after surgery (instead of oral diabetes medicines) to make sure you have good blood sugar levels. o The goal for blood sugar control after surgery is 80-180 mg/dL.  Reviewed and Endorsed by [facility] Patient Education Committee, August 2015   The Morning of Surgery  Do not wear jewelry, make-up or nail polish.  Do not wear lotions, powders, or perfumes/colognes, or deodorant  Do not shave 48 hours prior to surgery.   Do not bring valuables to the hospital.  [facility] is not responsible for any belongings or valuables.  If you are a smoker, DO NOT Smoke 24 hours prior to surgery IF you wear a CPAP at night please bring your mask, tubing, and machine the morning of surgery   Remember that you must have someone to transport you home after your surgery, and remain with you for 24 hours if you are discharged the same day.  Contacts, glasses, hearing aids, dentures or bridgework may not be worn into surgery.   Leave your suitcase in the car.  After surgery it may be brought to your room.  For patients admitted to the hospital, discharge time will be determined by your treatment team.  Patients discharged the day of surgery will not be allowed to drive home.   Special instructions:   [facility]- Preparing For Surgery  Before surgery, you can play an important role. Because skin is not sterile, your skin needs to be as free of germs as possible. You can reduce the number of germs on your skin by washing with CHG (chlorahexidine gluconate) Soap before surgery.  CHG is an antiseptic cleaner which kills germs and bonds with the skin to continue killing germs even after washing.    Oral Hygiene is also important to reduce your risk of infection.  Remember - BRUSH YOUR TEETH THE MORNING OF SURGERY WITH YOUR REGULAR TOOTHPASTE  Please do not use if you have an allergy to CHG or  antibacterial soaps. If your skin becomes reddened/irritated stop using the CHG.  Do not shave (including legs and underarms) for at least 48 hours prior to first CHG shower. It is OK to shave your face.  Please follow these instructions carefully.   1. Shower the NIGHT BEFORE SURGERY and the MORNING OF SURGERY with CHG Soap.   2. If you chose to wash your hair, wash your hair first as usual with your normal shampoo.  3. After you shampoo, rinse your hair and body thoroughly to remove the shampoo.  4. Use CHG as you would any other liquid soap. You can apply CHG directly to the skin and wash gently with a scrungie or a clean washcloth.   5. Apply the CHG Soap to your body ONLY FROM THE NECK DOWN.  Do not use on open wounds or open sores. Avoid contact with your eyes, ears, mouth and genitals (private parts). Wash Face and genitals (private parts)  with your normal soap.   6. Wash thoroughly, paying special attention to the area where your surgery will be performed.  7. Thoroughly rinse your body with warm water  from the neck down.  8. DO NOT shower/wash with your normal soap after using and rinsing off the CHG Soap.  9. Pat yourself dry with a CLEAN TOWEL.  10. Wear CLEAN PAJAMAS to bed the night before surgery, wear comfortable clothes the morning of surgery  11. Place CLEAN SHEETS on your bed the night of your first shower and DO NOT SLEEP WITH PETS.  Day of Surgery:  Do not apply any deodorants/lotions. Please shower the morning of surgery with the CHG soap  Please wear clean clothes to the hospital/surgery center.   Remember to brush your teeth WITH YOUR REGULAR TOOTHPASTE.  Please read over the following fact sheets that you were given.

## 2019-08-13 NOTE — Progress Notes (Signed)
Chelsea Benson [id number] 12-07-1950 68 y.o.  Subjective:   Patient ID:  Chelsea Benson is a 68 y.o. (DOB [birth year]) female.  Chief Complaint:  Chief Complaint  Patient presents with  . Anxiety  . Sleeping Problem    HPI Chelsea Benson presents to the office today for follow-up of mood and anxiety. She reports that she had anxiety last month when her daughter's father staying with them to help her with a home improvement project at the house where pt lives. "I didn't think I could, but now I know I can." She reports that daughter is being respectful of pt's comfort with being around daughter's father. Pt reports that she avoids contact with him as much as possible. She reports that anxiety is not as high as it was when he initially came. She has had some intrusive memories about the past. She does not recall nightmares but is awakening abruptly with anxious thoughts. Has also had some re-experiencing of past traumatic events. She reports panic attacks on a daily basis. Hyper-vigilance- "I'm walking on egg shells." She reports that she was sleeping better before her ex came to stay at their house. Now having intermittent awakenings. Denies significant depression. Appetite has been decreased and reports that she has been avoiding going into the kitchen and having confrontation with ex. She reports that she has lost about 7 lbs. Reports energy and motivation have been fair. She reports that she plans to try to walk more consistently again. She reports that she has been forgetting to take some medications consistently. Concentration has not been as good recently. Denies SI.   Has been enjoying grandchildren and playing with 48 yo granddaughter. Enjoyed seeing extended family yesterday.  Reports that she had severe HA's with Belsomra and was unable to tolerate this.   Has been forgetting to take mid-day dose of Buspar.    Review of Systems:  Review of Systems  Eyes: Positive for redness  and itching.  Musculoskeletal: Positive for back pain and gait problem.  Allergic/Immunologic: Positive for environmental allergies.  Neurological: Negative for tremors.  Psychiatric/Behavioral:       Please refer to HPI    Medications: I have reviewed the patient's current medications.  Current Outpatient Medications  Medication Sig Dispense Refill  . albuterol (PROVENTIL) (2.5 MG/3ML) 0.083% nebulizer solution Take 3 mLs (2.5 mg total) by nebulization every 4 (four) hours as needed for wheezing or shortness of breath. 375 mL 1  . aspirin EC 81 MG tablet Take 81 mg by mouth daily.    Marland Kitchen azelastine (ASTELIN) 0.1 % nasal spray Place 1 spray into both nostrils 2 (two) times daily. 30 mL 4  . budesonide-formoterol (SYMBICORT) 160-4.5 MCG/ACT inhaler INHALE 2 PUFFS INTO THE LUNGS TWICE DAILY (Patient taking differently: Inhale 2 puffs into the lungs 2 (two) times daily. ) 30.6 g 1  . busPIRone (BUSPAR) 15 MG tablet Take 1.5 tablets (22.5 mg total) by mouth 2 (two) times daily. 270 tablet 0  . diltiazem (CARDIZEM CD) 180 MG 24 hr capsule Take 180 mg by mouth daily.     . famotidine (PEPCID) 20 MG tablet One after supper (Patient taking differently: Take 20 mg by mouth daily after supper. ) 30 tablet 11  . fexofenadine (ALLEGRA) 180 MG tablet Take 180 mg by mouth daily.    . fluticasone (FLONASE) 50 MCG/ACT nasal spray Use 1-2 sprays in each nostril in each nostril once daily (Patient taking differently: Place 2 sprays into both nostrils 2 (  two) times daily. ) 48 g 1  . hydrochlorothiazide (HYDRODIURIL) 25 MG tablet Take 25 mg by mouth daily.     . hydrOXYzine (ATARAX/VISTARIL) 25 MG tablet TAKE 1 TABLET(25 MG) BY MOUTH EVERY 6 HOURS AS NEEDED (Patient taking differently: Take 25 mg by mouth every 6 (six) hours as needed for anxiety. ) 270 tablet 1  . Mepolizumab (NUCALA) 100 MG/ML SOAJ Inject 100 mg into the skin every 30 (thirty) days.    . metFORMIN (GLUCOPHAGE-XR) 500 MG 24 hr tablet Take 500 mg  by mouth 2 (two) times daily with a meal.     . mirtazapine (REMERON) 30 MG tablet TAKE 1 TABLET(30 MG) BY MOUTH AT BEDTIME (Patient taking differently: Take 30 mg by mouth at bedtime. ) 90 tablet 1  . montelukast (SINGULAIR) 10 MG tablet Take 1 tablet (10 mg total) by mouth at bedtime. 90 tablet 0  . Olopatadine HCl (PAZEO) 0.7 % SOLN Apply 1 drop to eye daily as needed. (Patient taking differently: Apply 1 drop to eye daily as needed (allergies). ) 2.5 mL 1  . Oxcarbazepine (TRILEPTAL) 300 MG tablet Take 2 tablets (600 mg total) by mouth at bedtime. 90 tablet 1  . oxybutynin (DITROPAN-XL) 10 MG 24 hr tablet Take 10 mg by mouth daily.     Marland Kitchen oxyCODONE-acetaminophen (PERCOCET) 10-325 MG tablet Take 1 tablet by mouth every 8 (eight) hours as needed for pain.     Marland Kitchen OZEMPIC, 0.25 OR 0.5 MG/DOSE, 2 MG/1.5ML SOPN Inject 0.5 mg into the skin every 7 (seven) days.     . pantoprazole (PROTONIX) 40 MG tablet Take 1 tablet (40 mg total) by mouth daily. Take 30-60 min before first meal of the day 30 tablet 2  . potassium chloride SA (K-DUR) 20 MEQ tablet Take 20 mEq by mouth daily.    . pravastatin (PRAVACHOL) 20 MG tablet Take 20 mg by mouth at bedtime.     . pregabalin (LYRICA) 100 MG capsule Take 100 mg by mouth 3 (three) times daily.    Marland Kitchen zolpidem (AMBIEN) 10 MG tablet Take 1/2-1 tab (Patient taking differently: Take 5-10 mg by mouth at bedtime as needed for sleep. ) 90 tablet 1  . ACCU-CHEK GUIDE test strip TEST BID PRN  1  . ARIPiprazole (ABILIFY) 2 MG tablet Take 1 tablet (2 mg total) by mouth daily. 90 tablet 1  . Blood Glucose Monitoring Suppl (ACCU-CHEK GUIDE) w/Device KIT See admin instructions.  1  . CLOBETASOL PROPIONATE E 0.05 % emollient cream Apply 1 application topically 2 (two) times daily as needed (rash).     . EPINEPHrine (EPIPEN 2-PAK) 0.3 mg/0.3 mL IJ SOAJ injection Use as directed for severe allergic reaction (Patient taking differently: Inject 0.3 mg into the muscle once as needed for  anaphylaxis (severe allergic reaction). ) 2 Device 1  . Respiratory Therapy Supplies (NEBULIZER) DEVI Use as directed with nebulizer solution. 1 each 1  . Respiratory Therapy Supplies (NEBULIZER/TUBING/MOUTHPIECE) KIT Use as directed with nebulizer machine 1 kit 12   Current Facility-Administered Medications  Medication Dose Route Frequency Provider Last Rate Last Dose  . omalizumab Arvid Right) injection 375 mg  375 mg Subcutaneous Q14 Days Kennith Gain, MD   375 mg at 02/12/19 1510    Medication Side Effects: None  Allergies:  Allergies  Allergen Reactions  . Carvedilol Cough  . Cymbalta [Duloxetine Hcl] Other (See Comments)    Caused hair loss  . Fish Allergy Itching  . Morphine And Related Itching  .  Rosuvastatin Calcium Other (See Comments) and Cough    Rapid heart rate  . Almond Meal Itching and Swelling    Tongue swells and itches  . Almond Oil Itching and Swelling    Itching and swelling of tongue   . Losartan Potassium Cough    Past Medical History:  Diagnosis Date  . Allergic rhinoconjunctivitis   . Allergy history unknown   . Anxiety   . Arthritis    DDD, spondylosis  . Asthma   . Chronic back pain    Has had several surgeries.  He uses a walker  . Chronic kidney disease    renal calculi  . Depression   . Diabetes mellitus without complication ([place])    dx in 2007  . Diabetes mellitus, type II ([place])   . Eczema   . Headache(784.0)    sinus related   . History of kidney stones   . Hx of pulmonary embolus 2017  . Hyperlipidemia   . Hypertension   . Insomnia   . Knee pain   . Left ankle pain   . Memory change   . Mental disorder   . Mood disorder ([place])   . OSA on CPAP    PSG 05/24/2016: AHI 17/hour 02 mean 76%.  HSAT 06/13/17, AHI 11-hour 02 mean 79%  . Recurrent upper respiratory infection (URI)   . Sleep apnea   . Tremor of both hands     Family History  Problem Relation Age of Onset  . Diabetes Sister   . Diabetes Brother   .  Hypertension Brother   . Cancer Maternal Grandmother   . Heart failure Maternal Grandmother   . Hypertension Paternal Grandmother   . Asthma Daughter   . Cancer Daughter   . Depression Daughter   . Hypertension Daughter   . Heart failure Maternal Aunt   . Pneumonia Mother   . Diabetes Mother   . Alcoholism Father   . Alcohol abuse Father   . Depression Daughter   . Schizophrenia Grandchild   . Allergies Neg Hx   . Eczema Neg Hx   . Immunodeficiency Neg Hx     Social History   Socioeconomic History  . Marital status: Single    Spouse name: Not on file  . Number of children: 2  . Years of education: Not on file  . Highest education level: Some college, no degree  Occupational History    Comment: NA  Social Needs  . Financial resource strain: Not on file  . Food insecurity    Worry: Not on file    Inability: Not on file  . Transportation needs    Medical: Not on file    Non-medical: Not on file  Tobacco Use  . Smoking status: Passive Smoke Exposure - Never Smoker  . Smokeless tobacco: Never Used  . Tobacco comment: grandson smokes, but in his car  Substance and Sexual Activity  . Alcohol use: Yes    Comment: rare use  . Drug use: No  . Sexual activity: Not Currently  Lifestyle  . Physical activity    Days per week: Not on file    Minutes per session: Not on file  . Stress: Not on file  Relationships  . Social Herbalist on phone: Not on file    Gets together: Not on file    Attends religious service: Not on file    Active member of club or organization: Not on file  Attends meetings of clubs or organizations: Not on file    Relationship status: Not on file  . Intimate partner violence    Fear of current or ex partner: Not on file    Emotionally abused: Not on file    Physically abused: Not on file    Forced sexual activity: Not on file  Other Topics Concern  . Not on file  Social History Narrative   She is currently single.  Has 2 girls.   Has some college education.  Currently disabled due to chronic back pain and not employed.   07/01/19 lives with brother Shanon Brow   Caffeine, none    Past Medical History, Surgical history, Social history, and Family history were reviewed and updated as appropriate.   Please see review of systems for further details on the patient's review from today.   Objective:   Physical Exam:  Wt 185 lb (83.9 kg)   BMI 31.76 kg/m   Physical Exam Constitutional:      General: She is not in acute distress.    Appearance: She is well-developed.  Musculoskeletal:        General: No deformity.     Comments: Ambulates with rolator  Neurological:     Mental Status: She is alert and oriented to person, place, and time.     Coordination: Coordination normal.  Psychiatric:        Attention and Perception: Attention and perception normal. She does not perceive auditory or visual hallucinations.        Mood and Affect: Mood normal. Mood is not anxious or depressed. Affect is not labile, blunt, angry or inappropriate.        Speech: Speech normal.        Behavior: Behavior normal.        Thought Content: Thought content normal. Thought content does not include homicidal or suicidal ideation. Thought content does not include homicidal or suicidal plan.        Cognition and Memory: Cognition and memory normal.        Judgment: Judgment normal.     Comments: Insight intact. No delusions.      Lab Review:     Component Value Date/Time   NA 136 04/28/2019 1712   NA 134 (A) 05/23/2017   K 3.7 04/28/2019 1712   CL 101 04/28/2019 1712   CO2 25 04/28/2019 1712   GLUCOSE 269 (H) 04/28/2019 1712   BUN 15 04/28/2019 1712   BUN 14 05/23/2017   CREATININE 0.71 04/28/2019 1712   CALCIUM 9.5 04/28/2019 1712   PROT 7.3 04/28/2019 1712   ALBUMIN 4.2 04/28/2019 1712   AST 21 04/28/2019 1712   ALT 30 04/28/2019 1712   ALKPHOS 68 04/28/2019 1712   BILITOT 0.5 04/28/2019 1712   GFRNONAA >60 04/28/2019 1712    GFRAA >60 04/28/2019 1712       Component Value Date/Time   WBC 5.4 04/28/2019 1712   RBC 4.92 04/28/2019 1712   HGB 12.8 04/28/2019 1712   HGB 12.5 11/08/2018 1041   HCT 41.1 04/28/2019 1712   HCT 39.5 11/08/2018 1041   PLT 233 04/28/2019 1712   MCV 83.5 04/28/2019 1712   MCV 81 11/08/2018 1041   MCH 26.0 04/28/2019 1712   MCHC 31.1 04/28/2019 1712   RDW 14.6 04/28/2019 1712   RDW 14.3 11/08/2018 1041   LYMPHSABS 2.1 04/28/2019 1712   LYMPHSABS 1.4 11/08/2018 1041   MONOABS 0.5 04/28/2019 1712   EOSABS 0.3 04/28/2019 1712  EOSABS 0.2 11/08/2018 1041   BASOSABS 0.0 04/28/2019 1712   BASOSABS 0.0 11/08/2018 1041    No results found for: POCLITH, LITHIUM   No results found for: PHENYTOIN, PHENOBARB, VALPROATE, CBMZ   .res Assessment: Plan:   Patient seen for 30 minutes and greater than 50% of visit spent counseling patient regarding current anxiety signs and symptoms and discussing that having ex-husband being in the same home is causing some recurrence in intrusive memories, re-experiencing, and possible nightmares.  Encouraged patient to discuss recent triggers to anxiety with therapist and discussed that this provider may consult with patient's therapist and inform her of recent stressors.  Encouraged patient to consider ways that she can ensure her safety and feeling safe.  Will change Buspar from 15 mg 1 tab p.o. TID to 1.5 tabs p.o. BID since patient reports difficulty remembering to take midday dose and taking higher dose twice daily will increase compliance and total daily dose of BuSpar. Will continue all other medications as prescribed. Patient to follow-up with this provider in 6 weeks or sooner if clinically indicated. Patient advised to contact office with any questions, adverse effects, or acute worsening in signs and symptoms.    Zelina was seen today for anxiety and sleeping problem.  Diagnoses and all orders for this visit:  Bipolar II disorder  ([place])  Anxiety disorder, unspecified type -     busPIRone (BUSPAR) 15 MG tablet; Take 1.5 tablets (22.5 mg total) by mouth 2 (two) times daily. -     Oxcarbazepine (TRILEPTAL) 300 MG tablet; Take 2 tablets (600 mg total) by mouth at bedtime.  Primary insomnia -     Oxcarbazepine (TRILEPTAL) 300 MG tablet; Take 2 tablets (600 mg total) by mouth at bedtime.     Please see After Visit Summary for patient specific instructions.  Future Appointments  Date Time Provider [place]  08/14/2019  8:00 AM [facility] PAT 2 [facility] None  08/17/2019 11:10 AM [facility] None  08/19/2019  9:00 AM Shanon Ace, LCSW [facility] None  09/04/2019  3:30 PM Nelva Bush Rae Halsted, MD [facility] None  09/24/2019 11:00 AM Thayer Headings, PMHNP [facility] None    No orders of the defined types were placed in this encounter.   -------------------------------

## 2019-08-14 ENCOUNTER — Other Ambulatory Visit: Payer: Self-pay

## 2019-08-14 ENCOUNTER — Encounter (HOSPITAL_COMMUNITY)
Admission: RE | Admit: 2019-08-14 | Discharge: 2019-08-14 | Disposition: A | Discharge status: Home / Self Care | Payer: 59 | Source: Ambulatory Visit | Attending: Neurosurgery | Admitting: Neurosurgery

## 2019-08-14 ENCOUNTER — Encounter (HOSPITAL_COMMUNITY): Payer: Self-pay

## 2019-08-14 ENCOUNTER — Telehealth: Payer: Self-pay | Admitting: Allergy

## 2019-08-14 DIAGNOSIS — Z01812 Encounter for preprocedural laboratory examination: Secondary | ICD-10-CM | POA: Insufficient documentation

## 2019-08-14 DIAGNOSIS — Z20828 Contact with and (suspected) exposure to other viral communicable diseases: Secondary | ICD-10-CM | POA: Insufficient documentation

## 2019-08-14 HISTORY — DX: Personal history of other venous thrombosis and embolism: Z86.718

## 2019-08-14 HISTORY — DX: Bipolar disorder, unspecified: F31.9

## 2019-08-14 LAB — CBC
HCT: 41.9 % (ref 36.0–46.0)
Hemoglobin: 12.8 g/dL (ref 12.0–15.0)
MCH: 26.3 pg (ref 26.0–34.0)
MCHC: 30.5 g/dL (ref 30.0–36.0)
MCV: 86.2 fL (ref 80.0–100.0)
Platelets: 289 10*3/uL (ref 150–400)
RBC: 4.86 MIL/uL (ref 3.87–5.11)
RDW: 14.9 % (ref 11.5–15.5)
WBC: 4.8 10*3/uL (ref 4.0–10.5)
nRBC: 0 % (ref 0.0–0.2)

## 2019-08-14 LAB — GLUCOSE, CAPILLARY: Glucose-Capillary: 120 mg/dL — ABNORMAL HIGH (ref 70–99)

## 2019-08-14 LAB — SURGICAL PCR SCREEN
MRSA, PCR: NEGATIVE
Staphylococcus aureus: POSITIVE — AB

## 2019-08-14 LAB — TYPE AND SCREEN
ABO/RH(D): A POS
Antibody Screen: NEGATIVE

## 2019-08-14 LAB — BASIC METABOLIC PANEL
Anion gap: 10 (ref 5–15)
BUN: 17 mg/dL (ref 8–23)
CO2: 28 mmol/L (ref 22–32)
Calcium: 9.8 mg/dL (ref 8.9–10.3)
Chloride: 103 mmol/L (ref 98–111)
Creatinine, Ser: 0.81 mg/dL (ref 0.44–1.00)
GFR calc Af Amer: >60 mL/min (ref >60)
GFR calc non Af Amer: >60 mL/min (ref >60)
Glucose, Bld: 120 mg/dL — ABNORMAL HIGH (ref 70–99)
Potassium: 3 mmol/L — ABNORMAL LOW (ref 3.5–5.1)
Sodium: 141 mmol/L (ref 135–145)

## 2019-08-14 NOTE — Telephone Encounter (Signed)
Patient called back and stated that she is still using the triamcinolone below the face and that is helping manage her Eczema. She does state that she would like for a medication to be sent to use to treat the Eczema on her face. Please advise.

## 2019-08-14 NOTE — Telephone Encounter (Signed)
Called patient and left voicemail asking to return call to discuss. Need to ask patient if she is still using the triamcinolone below the face for her Eczema.

## 2019-08-14 NOTE — Telephone Encounter (Signed)
Can have her try Desonide 2.72% thin application twice a day as needed for eczema flare of face

## 2019-08-14 NOTE — Progress Notes (Signed)
IBM'd  Dr. Saintclair Halsted about abnormal labs (K 3.0).

## 2019-08-14 NOTE — Progress Notes (Signed)
PCP - Dr. Kathyrn Lass Cardiologist - denies Pulmonologist - Dr. Christinia Gully  Chest x-ray - 04/28/2019 EKG - 04/28/2019 Stress Test - denies ECHO - per patient "3 years ago" - records requested Cardiac Cath - denies  Sleep Study - YES CPAP - Patient stated, not currently, stopped for one year due to getting recurrent sinus infections. Per patient "going to a class tomorrow to learn how to use it.  Fasting Blood Sugar - 46 - 123 Checks Blood Sugar 2 times a day  Blood Thinner Instructions: N/A Aspirin Instructions: Follow your surgeon's instructions on when to stop Aspirin.  If no instructions were given by your surgeon then you will need to call the office to get those instructions.    Anesthesia review: YES, cardiac records requested, hx of PE  Patient denies shortness of breath, fever, cough and chest pain at PAT appointment  Patient verbalized understanding of instructions that were given to them at the PAT appointment. Patient was also instructed that they will need to review over the PAT instructions again at home before surgery.

## 2019-08-14 NOTE — Telephone Encounter (Signed)
Patient called requesting something be sent in for her eczema. [facility] on [address].

## 2019-08-15 ENCOUNTER — Other Ambulatory Visit: Payer: Self-pay | Admitting: *Deleted

## 2019-08-15 LAB — HEMOGLOBIN A1C
Hgb A1c MFr Bld: 7.3 % — ABNORMAL HIGH (ref 4.8–5.6)
Mean Plasma Glucose: 163 mg/dL

## 2019-08-15 MED ORDER — DESONIDE 0.05 % EX OINT: 1.0000 "application " | TOPICAL_OINTMENT | Freq: Two times a day (BID) | CUTANEOUS | 5 refills | Status: DC | PRN

## 2019-08-15 NOTE — Telephone Encounter (Signed)
Prescription has been sent in to requested pharmacy. Called patient and left a voicemail asking to call back to inform.

## 2019-08-15 NOTE — Anesthesia Preprocedure Evaluation (Addendum)
Anesthesia Evaluation  Patient identified by MRN, date of birth, ID band Patient awake    Reviewed: Allergy & Precautions, H&P , NPO status , Patient's Chart, lab work & pertinent test results  Airway Mallampati: II   Neck ROM: full    Dental   Pulmonary asthma , sleep apnea ,    breath sounds clear to auscultation       Cardiovascular hypertension, + DVT   Rhythm:regular Rate:Normal     Neuro/Psych  Headaches, PSYCHIATRIC DISORDERS Anxiety Depression Bipolar Disorder  Neuromuscular disease    GI/Hepatic   Endo/Other  diabetes, Type 2  Renal/GU stones     Musculoskeletal  (+) Arthritis ,   Abdominal   Peds  Hematology   Anesthesia Other Findings   Reproductive/Obstetrics                            Anesthesia Physical Anesthesia Plan  ASA: III  Anesthesia Plan: General   Post-op Pain Management:    Induction: Intravenous  PONV Risk Score and Plan: 3 and Ondansetron, Dexamethasone, Midazolam and Treatment may vary due to age or medical condition  Airway Management Planned: Oral ETT  Additional Equipment:   Intra-op Plan:   Post-operative Plan: Extubation in OR  Informed Consent: I have reviewed the patients History and Physical, chart, labs and discussed the procedure including the risks, benefits and alternatives for the proposed anesthesia with the patient or authorized representative who has indicated his/her understanding and acceptance.       Plan Discussed with: CRNA, Anesthesiologist and Surgeon  Anesthesia Plan Comments: (Hx of PE in 2017, completed 6 month course of Xarelto. Also underwent a cardiac workup at that time that was benign, nonischemic stress, echo with normal LVEF.  She has moderate persistent asthma/UACS and is followed by allergy/asthma clinic.   Echo 07/05/16  ([facility], in [place], [place]): 1. LV systolic function normal  with visual assessment. EF estimated to be 60%. No obvious wall motion abnormalities identified in the views obtained. 2. RV systolic pressure mildly increased. Estimated peak pressure was 45 mmHg.  Nuclear stress test 03/23/16 ([facility], in [place], [place]): 1. Negative pharmacologic stress test from an electrocardiographic standpoint 2. Most likely normal perfusion study.  3. Perfusion imaging shows no evidence of significant ongoing ischemia or prior infarction. There is apical wall thinning noted. This is likely consistent with tissue attenuation. 4. Gated SPECT imaging shows normal LV chamber size and function with an estimated EF 67%. No obvious regional wall motion abnormalities noted. Of note, there is no significant apical wall motion abnormality. 5. In comparison to prior study of 12/2008, no significant changes are noted. 6. This is low risk finding. )    Anesthesia Quick Evaluation

## 2019-08-17 ENCOUNTER — Other Ambulatory Visit (HOSPITAL_COMMUNITY)
Admission: RE | Admit: 2019-08-17 | Discharge: 2019-08-17 | Disposition: A | Discharge status: Home / Self Care | Payer: 59 | Source: Ambulatory Visit | Attending: Neurosurgery | Admitting: Neurosurgery

## 2019-08-17 DIAGNOSIS — Z01812 Encounter for preprocedural laboratory examination: Secondary | ICD-10-CM | POA: Diagnosis not present

## 2019-08-18 LAB — NOVEL CORONAVIRUS, NAA (HOSP ORDER, SEND-OUT TO REF LAB; TAT 18-24 HRS): SARS-CoV-2, NAA: NOT DETECTED

## 2019-08-19 ENCOUNTER — Other Ambulatory Visit: Payer: Self-pay

## 2019-08-19 ENCOUNTER — Ambulatory Visit (INDEPENDENT_AMBULATORY_CARE_PROVIDER_SITE_OTHER): Payer: 59 | Admitting: Psychiatry

## 2019-08-19 DIAGNOSIS — F3181 Bipolar II disorder: Secondary | ICD-10-CM | POA: Diagnosis not present

## 2019-08-19 NOTE — Progress Notes (Addendum)
      [facility] Counselor/Therapist Progress Note  Patient ID: Chelsea Benson, MRN: [id number],    Date: 08/19/2019  Time Spent: 45 minutes   9:00am to 9:45am  Treatment Type: Individual Therapy    Virtual Visit  Video  Note Connected with patient by a video enabled telemedicine/telehealth application, with their informed consent, and verified patient privacy and that I am speaking with the correct person using two identifiers. I discussed the limitations, risks, security and privacy concerns of performing psychotherapy and management service by telephone and the availability of in person appointments. I also discussed with the patient that there may be a patient responsible charge related to this service. The patient expressed understanding and agreed to proceed. I discussed the treatment planning with the patient. The patient was provided an opportunity to ask questions and all were answered. The patient agreed with the plan and demonstrated an understanding of the instructions. The patient was advised to call  our office if  symptoms worsen or feel they are in a crisis state and need immediate contact.   Therapist Location: [facility] Patient Location: home   Reported Symptoms:  Anxiety , depression, has upcoming back surgery this week (Wed.)  Mental Status Exam:  Appearance:   Casual     Behavior:  Appropriate and Sharing  Motor:  Normal  Speech/Language:   Clear and Coherent  Affect:  Appropriate  Mood:  anxiety has increased some; depression has decreased  Thought process:  normal  Thought content:    WNL  Sensory/Perceptual disturbances:    WNL  Orientation:  oriented to person, place, time/date, situation, day of week, month of year and year  Attention:  Good  Concentration:  Good  Memory:  WNL  Fund of knowledge:   Good  Insight:    Good  Judgment:   Good  Impulse Control:  Good   Risk Assessment: Danger to Self:  No Self-injurious Behavior:  No Danger to Others: No Duty to Warn:no Physical Aggression / Violence:No  Access to Firearms a concern: No  Gang Involvement: No  Subjective: Patient stating today her anxiety has increased due to daughter's dad staying with her temporarily but she doesn't know how long.  They are divorced.  Communication is very difficult.  Doesn't trust him due to past.  Talked about who all is supporting her in the family during this time and he is to be gone by this weekend.  Interventions: Cognitive Behavioral Therapy and Solution-Oriented/Positive Psychology  Diagnosis:   ICD-10-CM   1. Bipolar II disorder ([place])  F31.81     Plan:   Treatment Goals: Patient not signing tx goals on computer screen due to [place].  Long term goal: Reduce overall level, frequency, and intensity of the anxiety so that daily functioning is not impaired.   Short term goal: Increase understanding of beliefs and messages that produce the worry and anxiety.  Strategy: Patient will explore and work to stop cognitive messages that feed anxiety and work to replace them with more positive and empowering messages.   Progressing: Patient motivated and already working to monitor her thoughts that increase or support anxiety.  Has upcoming back surgery this week and will follow up with her on her goals at next session.    Next appt will be Oct. 5th, 2020.  Shanon Ace, LCSW

## 2019-08-19 NOTE — Telephone Encounter (Signed)
Spoke with patient and she did pick up the prescription for Desonide and states her face is improving.

## 2019-08-21 ENCOUNTER — Encounter (HOSPITAL_COMMUNITY)
Admission: RE | Disposition: A | Discharge status: Home / Self Care | Payer: Self-pay | Source: Home / Self Care | Attending: Neurosurgery

## 2019-08-21 ENCOUNTER — Inpatient Hospital Stay (HOSPITAL_COMMUNITY): Payer: 59 | Admitting: Certified Registered Nurse Anesthetist

## 2019-08-21 ENCOUNTER — Other Ambulatory Visit: Payer: Self-pay

## 2019-08-21 ENCOUNTER — Inpatient Hospital Stay (HOSPITAL_COMMUNITY)
Admission: RE | Admit: 2019-08-21 | Discharge: 2019-08-23 | DRG: 455 | Disposition: A | Discharge status: Home / Self Care | Payer: 59 | Attending: Neurosurgery | Admitting: Neurosurgery

## 2019-08-21 ENCOUNTER — Inpatient Hospital Stay (HOSPITAL_COMMUNITY): Payer: 59

## 2019-08-21 ENCOUNTER — Inpatient Hospital Stay (HOSPITAL_COMMUNITY): Payer: 59 | Admitting: Physician Assistant

## 2019-08-21 ENCOUNTER — Encounter (HOSPITAL_COMMUNITY): Payer: Self-pay | Admitting: Certified Registered Nurse Anesthetist

## 2019-08-21 DIAGNOSIS — Z7984 Long term (current) use of oral hypoglycemic drugs: Secondary | ICD-10-CM

## 2019-08-21 DIAGNOSIS — Z86711 Personal history of pulmonary embolism: Secondary | ICD-10-CM

## 2019-08-21 DIAGNOSIS — Z833 Family history of diabetes mellitus: Secondary | ICD-10-CM | POA: Diagnosis not present

## 2019-08-21 DIAGNOSIS — Z96651 Presence of right artificial knee joint: Secondary | ICD-10-CM | POA: Diagnosis present

## 2019-08-21 DIAGNOSIS — N189 Chronic kidney disease, unspecified: Secondary | ICD-10-CM | POA: Diagnosis present

## 2019-08-21 DIAGNOSIS — Z811 Family history of alcohol abuse and dependence: Secondary | ICD-10-CM

## 2019-08-21 DIAGNOSIS — G4733 Obstructive sleep apnea (adult) (pediatric): Secondary | ICD-10-CM | POA: Diagnosis present

## 2019-08-21 DIAGNOSIS — Z818 Family history of other mental and behavioral disorders: Secondary | ICD-10-CM | POA: Diagnosis not present

## 2019-08-21 DIAGNOSIS — F419 Anxiety disorder, unspecified: Secondary | ICD-10-CM | POA: Diagnosis present

## 2019-08-21 DIAGNOSIS — E1122 Type 2 diabetes mellitus with diabetic chronic kidney disease: Secondary | ICD-10-CM | POA: Diagnosis present

## 2019-08-21 DIAGNOSIS — I129 Hypertensive chronic kidney disease with stage 1 through stage 4 chronic kidney disease, or unspecified chronic kidney disease: Secondary | ICD-10-CM | POA: Diagnosis present

## 2019-08-21 DIAGNOSIS — Z20828 Contact with and (suspected) exposure to other viral communicable diseases: Secondary | ICD-10-CM | POA: Diagnosis present

## 2019-08-21 DIAGNOSIS — L309 Dermatitis, unspecified: Secondary | ICD-10-CM | POA: Diagnosis present

## 2019-08-21 DIAGNOSIS — Z825 Family history of asthma and other chronic lower respiratory diseases: Secondary | ICD-10-CM

## 2019-08-21 DIAGNOSIS — M532X6 Spinal instabilities, lumbar region: Secondary | ICD-10-CM | POA: Diagnosis present

## 2019-08-21 DIAGNOSIS — Z91018 Allergy to other foods: Secondary | ICD-10-CM

## 2019-08-21 DIAGNOSIS — Z7982 Long term (current) use of aspirin: Secondary | ICD-10-CM

## 2019-08-21 DIAGNOSIS — Z91013 Allergy to seafood: Secondary | ICD-10-CM | POA: Diagnosis not present

## 2019-08-21 DIAGNOSIS — M48061 Spinal stenosis, lumbar region without neurogenic claudication: Secondary | ICD-10-CM | POA: Diagnosis present

## 2019-08-21 DIAGNOSIS — Z888 Allergy status to other drugs, medicaments and biological substances status: Secondary | ICD-10-CM | POA: Diagnosis not present

## 2019-08-21 DIAGNOSIS — Z809 Family history of malignant neoplasm, unspecified: Secondary | ICD-10-CM | POA: Diagnosis not present

## 2019-08-21 DIAGNOSIS — M549 Dorsalgia, unspecified: Secondary | ICD-10-CM | POA: Diagnosis present

## 2019-08-21 DIAGNOSIS — Z8249 Family history of ischemic heart disease and other diseases of the circulatory system: Secondary | ICD-10-CM | POA: Diagnosis not present

## 2019-08-21 DIAGNOSIS — E785 Hyperlipidemia, unspecified: Secondary | ICD-10-CM | POA: Diagnosis present

## 2019-08-21 DIAGNOSIS — Z885 Allergy status to narcotic agent status: Secondary | ICD-10-CM

## 2019-08-21 DIAGNOSIS — F319 Bipolar disorder, unspecified: Secondary | ICD-10-CM | POA: Diagnosis present

## 2019-08-21 DIAGNOSIS — Z7951 Long term (current) use of inhaled steroids: Secondary | ICD-10-CM

## 2019-08-21 DIAGNOSIS — Z419 Encounter for procedure for purposes other than remedying health state, unspecified: Secondary | ICD-10-CM

## 2019-08-21 LAB — GLUCOSE, CAPILLARY
Glucose-Capillary: 121 mg/dL — ABNORMAL HIGH (ref 70–99)
Glucose-Capillary: 127 mg/dL — ABNORMAL HIGH (ref 70–99)
Glucose-Capillary: 133 mg/dL — ABNORMAL HIGH (ref 70–99)

## 2019-08-21 SURGERY — POSTERIOR LUMBAR FUSION 1 WITH HARDWARE REMOVAL
Anesthesia: General | Site: Back

## 2019-08-21 MED ORDER — ONDANSETRON HCL 4 MG/2ML IJ SOLN
INTRAMUSCULAR | Status: DC | PRN
  Administered 2019-08-21: 4 mg via INTRAVENOUS

## 2019-08-21 MED ORDER — FENTANYL CITRATE (PF) 100 MCG/2ML IJ SOLN
25.0000 ug | INTRAMUSCULAR | Status: DC | PRN
  Administered 2019-08-21 (×4): 25 ug via INTRAVENOUS

## 2019-08-21 MED ORDER — PANTOPRAZOLE SODIUM 40 MG PO TBEC
40.0000 mg | DELAYED_RELEASE_TABLET | Freq: Every day | ORAL | Status: DC
  Administered 2019-08-22 – 2019-08-23 (×2): 40 mg via ORAL
  Filled 2019-08-21 (×2): qty 1

## 2019-08-21 MED ORDER — BUPIVACAINE LIPOSOME 1.3 % IJ SUSP
20.0000 mL | INTRAMUSCULAR | Status: AC
  Administered 2019-08-21: 20 mL
  Filled 2019-08-21: qty 20

## 2019-08-21 MED ORDER — ONDANSETRON HCL 4 MG/2ML IJ SOLN
INTRAMUSCULAR | Status: AC
  Filled 2019-08-21: qty 2

## 2019-08-21 MED ORDER — FENTANYL CITRATE (PF) 250 MCG/5ML IJ SOLN
INTRAMUSCULAR | Status: AC
  Filled 2019-08-21: qty 5

## 2019-08-21 MED ORDER — SODIUM CHLORIDE 0.9% FLUSH: 3.0000 mL | Freq: Two times a day (BID) | INTRAVENOUS | Status: DC

## 2019-08-21 MED ORDER — LIDOCAINE HCL (CARDIAC) PF 100 MG/5ML IV SOSY
PREFILLED_SYRINGE | INTRAVENOUS | Status: DC | PRN
  Administered 2019-08-21: 60 mg via INTRAVENOUS

## 2019-08-21 MED ORDER — SODIUM CHLORIDE 0.9 % IV SOLN: 250.0000 mL | INTRAVENOUS | Status: DC

## 2019-08-21 MED ORDER — 0.9 % SODIUM CHLORIDE (POUR BTL) OPTIME
TOPICAL | Status: DC | PRN
  Administered 2019-08-21 (×2): 1000 mL

## 2019-08-21 MED ORDER — MIRTAZAPINE 30 MG PO TABS
30.0000 mg | ORAL_TABLET | Freq: Every day | ORAL | Status: DC
  Administered 2019-08-21 – 2019-08-22 (×2): 30 mg via ORAL
  Filled 2019-08-21 (×2): qty 1

## 2019-08-21 MED ORDER — CLOBETASOL PROP EMOLLIENT BASE 0.05 % EX CREA: 1.0000 "application " | TOPICAL_CREAM | Freq: Two times a day (BID) | CUTANEOUS | Status: DC | PRN

## 2019-08-21 MED ORDER — ALUM & MAG HYDROXIDE-SIMETH 200-200-20 MG/5ML PO SUSP: 30.0000 mL | Freq: Four times a day (QID) | ORAL | Status: DC | PRN

## 2019-08-21 MED ORDER — MIDAZOLAM HCL 2 MG/2ML IJ SOLN
INTRAMUSCULAR | Status: AC
  Filled 2019-08-21: qty 2

## 2019-08-21 MED ORDER — BUSPIRONE HCL 15 MG PO TABS
22.5000 mg | ORAL_TABLET | Freq: Two times a day (BID) | ORAL | Status: DC
  Administered 2019-08-21 – 2019-08-23 (×4): 22.5 mg via ORAL
  Filled 2019-08-21 (×4): qty 2

## 2019-08-21 MED ORDER — LIDOCAINE-EPINEPHRINE 1 %-1:100000 IJ SOLN
INTRAMUSCULAR | Status: DC | PRN
  Administered 2019-08-21: 5 mL

## 2019-08-21 MED ORDER — OXCARBAZEPINE 300 MG PO TABS
600.0000 mg | ORAL_TABLET | Freq: Every day | ORAL | Status: DC
  Administered 2019-08-21 – 2019-08-22 (×2): 600 mg via ORAL
  Filled 2019-08-21 (×2): qty 2

## 2019-08-21 MED ORDER — EPINEPHRINE 0.3 MG/0.3ML IJ SOAJ: 0.3000 mg | Freq: Once | INTRAMUSCULAR | Status: DC | PRN

## 2019-08-21 MED ORDER — CYCLOBENZAPRINE HCL 10 MG PO TABS
10.0000 mg | ORAL_TABLET | Freq: Three times a day (TID) | ORAL | Status: DC | PRN
  Administered 2019-08-21 – 2019-08-23 (×4): 10 mg via ORAL
  Filled 2019-08-21 (×4): qty 1

## 2019-08-21 MED ORDER — LACTATED RINGERS IV SOLN
Freq: Once | INTRAVENOUS | Status: AC
  Administered 2019-08-21: 12:00:00 via INTRAVENOUS

## 2019-08-21 MED ORDER — PANTOPRAZOLE SODIUM 40 MG IV SOLR: 40.0000 mg | Freq: Every day | INTRAVENOUS | Status: DC

## 2019-08-21 MED ORDER — SODIUM CHLORIDE 0.9 % IV SOLN
INTRAVENOUS | Status: DC | PRN
  Administered 2019-08-21: 14:00:00 20 ug/min via INTRAVENOUS

## 2019-08-21 MED ORDER — PREGABALIN 50 MG PO CAPS
100.0000 mg | ORAL_CAPSULE | Freq: Three times a day (TID) | ORAL | Status: DC
  Administered 2019-08-21 – 2019-08-23 (×5): 100 mg via ORAL
  Filled 2019-08-21 (×5): qty 2

## 2019-08-21 MED ORDER — FENTANYL CITRATE (PF) 100 MCG/2ML IJ SOLN
INTRAMUSCULAR | Status: AC
  Administered 2019-08-21: 100 ug via INTRAVENOUS
  Filled 2019-08-21: qty 2

## 2019-08-21 MED ORDER — PROPOFOL 10 MG/ML IV BOLUS
INTRAVENOUS | Status: AC
  Filled 2019-08-21: qty 20

## 2019-08-21 MED ORDER — OXYCODONE-ACETAMINOPHEN 10-325 MG PO TABS: 1.0000 | ORAL_TABLET | Freq: Three times a day (TID) | ORAL | Status: DC | PRN

## 2019-08-21 MED ORDER — CEFAZOLIN SODIUM-DEXTROSE 2-4 GM/100ML-% IV SOLN
2.0000 g | Freq: Three times a day (TID) | INTRAVENOUS | Status: AC
  Administered 2019-08-21 – 2019-08-22 (×2): 2 g via INTRAVENOUS
  Filled 2019-08-21 (×2): qty 100

## 2019-08-21 MED ORDER — MIDAZOLAM HCL 2 MG/2ML IJ SOLN
INTRAMUSCULAR | Status: DC | PRN
  Administered 2019-08-21: 2 mg via INTRAVENOUS

## 2019-08-21 MED ORDER — OXYCODONE HCL 5 MG/5ML PO SOLN: 5.0000 mg | Freq: Once | ORAL | Status: AC | PRN

## 2019-08-21 MED ORDER — MOMETASONE FURO-FORMOTEROL FUM 200-5 MCG/ACT IN AERO
2.0000 | INHALATION_SPRAY | Freq: Two times a day (BID) | RESPIRATORY_TRACT | Status: DC
  Administered 2019-08-21 – 2019-08-22 (×3): 2 via RESPIRATORY_TRACT
  Filled 2019-08-21: qty 8.8

## 2019-08-21 MED ORDER — DEXAMETHASONE SODIUM PHOSPHATE 10 MG/ML IJ SOLN
INTRAMUSCULAR | Status: AC
  Filled 2019-08-21: qty 1

## 2019-08-21 MED ORDER — ONDANSETRON HCL 4 MG/2ML IJ SOLN: 4.0000 mg | Freq: Four times a day (QID) | INTRAMUSCULAR | Status: DC | PRN

## 2019-08-21 MED ORDER — MENTHOL 3 MG MT LOZG: 1.0000 | LOZENGE | OROMUCOSAL | Status: DC | PRN

## 2019-08-21 MED ORDER — MEPOLIZUMAB 100 MG/ML ~~LOC~~ SOAJ: 100.0000 mg | SUBCUTANEOUS | Status: DC

## 2019-08-21 MED ORDER — OXYCODONE-ACETAMINOPHEN 5-325 MG PO TABS: 1.0000 | ORAL_TABLET | ORAL | Status: DC | PRN

## 2019-08-21 MED ORDER — LIDOCAINE 2% (20 MG/ML) 5 ML SYRINGE
INTRAMUSCULAR | Status: AC
  Filled 2019-08-21: qty 5

## 2019-08-21 MED ORDER — AZELASTINE HCL 0.1 % NA SOLN
1.0000 | Freq: Two times a day (BID) | NASAL | Status: DC
  Administered 2019-08-21 – 2019-08-23 (×4): 1 via NASAL
  Filled 2019-08-21: qty 30

## 2019-08-21 MED ORDER — ROCURONIUM BROMIDE 10 MG/ML (PF) SYRINGE
PREFILLED_SYRINGE | INTRAVENOUS | Status: AC
  Filled 2019-08-21: qty 10

## 2019-08-21 MED ORDER — PROPOFOL 10 MG/ML IV BOLUS
INTRAVENOUS | Status: DC | PRN
  Administered 2019-08-21: 140 mg via INTRAVENOUS

## 2019-08-21 MED ORDER — HYDROXYZINE HCL 25 MG PO TABS: 25.0000 mg | ORAL_TABLET | Freq: Four times a day (QID) | ORAL | Status: DC | PRN

## 2019-08-21 MED ORDER — ALBUTEROL SULFATE (2.5 MG/3ML) 0.083% IN NEBU: 2.5000 mg | INHALATION_SOLUTION | RESPIRATORY_TRACT | Status: DC | PRN

## 2019-08-21 MED ORDER — HYDROCHLOROTHIAZIDE 25 MG PO TABS
25.0000 mg | ORAL_TABLET | Freq: Every day | ORAL | Status: DC
  Administered 2019-08-22 – 2019-08-23 (×2): 25 mg via ORAL
  Filled 2019-08-21 (×2): qty 1

## 2019-08-21 MED ORDER — SODIUM CHLORIDE 0.9 % IV SOLN
INTRAVENOUS | Status: DC | PRN
  Administered 2019-08-21: 500 mL

## 2019-08-21 MED ORDER — LABETALOL HCL 5 MG/ML IV SOLN
INTRAVENOUS | Status: AC
  Filled 2019-08-21: qty 4

## 2019-08-21 MED ORDER — MONTELUKAST SODIUM 10 MG PO TABS
10.0000 mg | ORAL_TABLET | Freq: Every day | ORAL | Status: DC
  Administered 2019-08-21 – 2019-08-22 (×2): 10 mg via ORAL
  Filled 2019-08-21 (×2): qty 1

## 2019-08-21 MED ORDER — HYDROMORPHONE HCL 1 MG/ML IJ SOLN
INTRAMUSCULAR | Status: AC
  Filled 2019-08-21: qty 1

## 2019-08-21 MED ORDER — LACTATED RINGERS IV SOLN
INTRAVENOUS | Status: DC | PRN
  Administered 2019-08-21 (×2): via INTRAVENOUS

## 2019-08-21 MED ORDER — HYDROCORTISONE 1 % EX CREA
TOPICAL_CREAM | Freq: Two times a day (BID) | CUTANEOUS | Status: DC
  Filled 2019-08-21: qty 28

## 2019-08-21 MED ORDER — PRAVASTATIN SODIUM 10 MG PO TABS
20.0000 mg | ORAL_TABLET | Freq: Every day | ORAL | Status: DC
  Administered 2019-08-21 – 2019-08-22 (×2): 20 mg via ORAL
  Filled 2019-08-21 (×2): qty 2

## 2019-08-21 MED ORDER — SEMAGLUTIDE(0.25 OR 0.5MG/DOS) 2 MG/1.5ML ~~LOC~~ SOPN: 0.5000 mg | PEN_INJECTOR | SUBCUTANEOUS | Status: DC

## 2019-08-21 MED ORDER — FENTANYL CITRATE (PF) 250 MCG/5ML IJ SOLN
INTRAMUSCULAR | Status: DC | PRN
  Administered 2019-08-21 (×3): 50 ug via INTRAVENOUS
  Administered 2019-08-21: 100 ug via INTRAVENOUS
  Administered 2019-08-21 (×2): 50 ug via INTRAVENOUS
  Administered 2019-08-21: 100 ug via INTRAVENOUS

## 2019-08-21 MED ORDER — PHENOL 1.4 % MT LIQD: 1.0000 | OROMUCOSAL | Status: DC | PRN

## 2019-08-21 MED ORDER — OXYCODONE HCL 5 MG PO TABS
5.0000 mg | ORAL_TABLET | Freq: Once | ORAL | Status: AC | PRN
  Administered 2019-08-21: 5 mg via ORAL

## 2019-08-21 MED ORDER — POTASSIUM CHLORIDE CRYS ER 20 MEQ PO TBCR
20.0000 meq | EXTENDED_RELEASE_TABLET | Freq: Every day | ORAL | Status: DC
  Administered 2019-08-21 – 2019-08-23 (×3): 20 meq via ORAL
  Filled 2019-08-21 (×3): qty 1

## 2019-08-21 MED ORDER — DEXAMETHASONE SODIUM PHOSPHATE 10 MG/ML IJ SOLN
10.0000 mg | Freq: Once | INTRAMUSCULAR | Status: AC
  Administered 2019-08-21: 13:00:00 10 mg via INTRAVENOUS
  Filled 2019-08-21: qty 1

## 2019-08-21 MED ORDER — LIDOCAINE-EPINEPHRINE 1 %-1:100000 IJ SOLN
INTRAMUSCULAR | Status: AC
  Filled 2019-08-21: qty 1

## 2019-08-21 MED ORDER — METFORMIN HCL ER 500 MG PO TB24
500.0000 mg | ORAL_TABLET | Freq: Two times a day (BID) | ORAL | Status: DC
  Administered 2019-08-22 – 2019-08-23 (×3): 500 mg via ORAL
  Filled 2019-08-21 (×3): qty 1

## 2019-08-21 MED ORDER — FENTANYL CITRATE (PF) 100 MCG/2ML IJ SOLN
INTRAMUSCULAR | Status: AC
  Filled 2019-08-21: qty 2

## 2019-08-21 MED ORDER — THROMBIN 20000 UNITS EX SOLR
CUTANEOUS | Status: AC
  Filled 2019-08-21: qty 20000

## 2019-08-21 MED ORDER — CHLORHEXIDINE GLUCONATE CLOTH 2 % EX PADS: 6.0000 | MEDICATED_PAD | Freq: Once | CUTANEOUS | Status: DC

## 2019-08-21 MED ORDER — ARIPIPRAZOLE 2 MG PO TABS
2.0000 mg | ORAL_TABLET | Freq: Every day | ORAL | Status: DC
  Administered 2019-08-22 – 2019-08-23 (×2): 2 mg via ORAL
  Filled 2019-08-21 (×2): qty 1

## 2019-08-21 MED ORDER — HYDROMORPHONE HCL 1 MG/ML IJ SOLN
0.5000 mg | INTRAMUSCULAR | Status: DC | PRN
  Administered 2019-08-21: 0.5 mg via INTRAVENOUS
  Filled 2019-08-21: qty 0.5

## 2019-08-21 MED ORDER — ONDANSETRON HCL 4 MG PO TABS: 4.0000 mg | ORAL_TABLET | Freq: Four times a day (QID) | ORAL | Status: DC | PRN

## 2019-08-21 MED ORDER — FENTANYL CITRATE (PF) 100 MCG/2ML IJ SOLN
100.0000 ug | Freq: Once | INTRAMUSCULAR | Status: AC
  Administered 2019-08-21: 12:00:00 100 ug via INTRAVENOUS

## 2019-08-21 MED ORDER — HYDROMORPHONE HCL 1 MG/ML IJ SOLN
0.2500 mg | INTRAMUSCULAR | Status: DC | PRN
  Administered 2019-08-21 (×2): 0.5 mg via INTRAVENOUS

## 2019-08-21 MED ORDER — CEFAZOLIN SODIUM-DEXTROSE 2-4 GM/100ML-% IV SOLN
2.0000 g | INTRAVENOUS | Status: AC
  Administered 2019-08-21: 13:00:00 2 g via INTRAVENOUS
  Filled 2019-08-21: qty 100

## 2019-08-21 MED ORDER — ACETAMINOPHEN 650 MG RE SUPP: 650.0000 mg | RECTAL | Status: DC | PRN

## 2019-08-21 MED ORDER — LABETALOL HCL 5 MG/ML IV SOLN
10.0000 mg | Freq: Once | INTRAVENOUS | Status: AC
  Administered 2019-08-21: 10 mg via INTRAVENOUS

## 2019-08-21 MED ORDER — OLOPATADINE HCL 0.7 % OP SOLN: 1.0000 [drp] | Freq: Every day | OPHTHALMIC | Status: DC | PRN

## 2019-08-21 MED ORDER — DILTIAZEM HCL ER COATED BEADS 180 MG PO CP24
180.0000 mg | ORAL_CAPSULE | Freq: Every day | ORAL | Status: DC
  Administered 2019-08-22 – 2019-08-23 (×2): 180 mg via ORAL
  Filled 2019-08-21 (×2): qty 1

## 2019-08-21 MED ORDER — FAMOTIDINE 20 MG PO TABS
20.0000 mg | ORAL_TABLET | Freq: Every day | ORAL | Status: DC
  Administered 2019-08-22: 17:00:00 20 mg via ORAL
  Filled 2019-08-21: qty 1

## 2019-08-21 MED ORDER — ROCURONIUM BROMIDE 10 MG/ML (PF) SYRINGE
PREFILLED_SYRINGE | INTRAVENOUS | Status: DC | PRN
  Administered 2019-08-21 (×2): 20 mg via INTRAVENOUS
  Administered 2019-08-21: 10 mg via INTRAVENOUS
  Administered 2019-08-21 (×2): 20 mg via INTRAVENOUS
  Administered 2019-08-21: 50 mg via INTRAVENOUS

## 2019-08-21 MED ORDER — LORATADINE 10 MG PO TABS
10.0000 mg | ORAL_TABLET | Freq: Every day | ORAL | Status: DC
  Administered 2019-08-23: 10 mg via ORAL
  Filled 2019-08-21 (×2): qty 1

## 2019-08-21 MED ORDER — ACETAMINOPHEN 325 MG PO TABS: 650.0000 mg | ORAL_TABLET | ORAL | Status: DC | PRN

## 2019-08-21 MED ORDER — OXYCODONE HCL 5 MG PO TABS
ORAL_TABLET | ORAL | Status: AC
  Filled 2019-08-21: qty 1

## 2019-08-21 MED ORDER — SODIUM CHLORIDE 0.9% FLUSH: 3.0000 mL | INTRAVENOUS | Status: DC | PRN

## 2019-08-21 MED ORDER — OXYCODONE-ACETAMINOPHEN 5-325 MG PO TABS
1.0000 | ORAL_TABLET | ORAL | Status: DC | PRN
  Administered 2019-08-21 – 2019-08-23 (×9): 2 via ORAL
  Filled 2019-08-21 (×9): qty 2

## 2019-08-21 MED ORDER — FLUTICASONE PROPIONATE 50 MCG/ACT NA SUSP
2.0000 | Freq: Two times a day (BID) | NASAL | Status: DC
  Administered 2019-08-21 – 2019-08-23 (×4): 2 via NASAL
  Filled 2019-08-21: qty 16

## 2019-08-21 MED ORDER — OXYBUTYNIN CHLORIDE ER 10 MG PO TB24
10.0000 mg | ORAL_TABLET | Freq: Every day | ORAL | Status: DC
  Administered 2019-08-22 – 2019-08-23 (×2): 10 mg via ORAL
  Filled 2019-08-21 (×2): qty 1

## 2019-08-21 MED ORDER — SUGAMMADEX SODIUM 200 MG/2ML IV SOLN
INTRAVENOUS | Status: DC | PRN
  Administered 2019-08-21: 170 mg via INTRAVENOUS

## 2019-08-21 MED ORDER — THROMBIN 20000 UNITS EX SOLR
CUTANEOUS | Status: DC | PRN
  Administered 2019-08-21: 20 mL

## 2019-08-21 MED ORDER — ASPIRIN EC 81 MG PO TBEC
81.0000 mg | DELAYED_RELEASE_TABLET | Freq: Every day | ORAL | Status: DC
  Administered 2019-08-22 – 2019-08-23 (×2): 81 mg via ORAL
  Filled 2019-08-21 (×2): qty 1

## 2019-08-21 SURGICAL SUPPLY — 87 items
ADH SKN CLS APL DERMABOND .7 (GAUZE/BANDAGES/DRESSINGS) ×1
APL SKNCLS STERI-STRIP NONHPOA (GAUZE/BANDAGES/DRESSINGS) ×1
BAG DECANTER FOR FLEXI CONT (MISCELLANEOUS) ×3 IMPLANT
BENZOIN TINCTURE PRP APPL 2/3 (GAUZE/BANDAGES/DRESSINGS) ×3 IMPLANT
BLADE CLIPPER SURG (BLADE) IMPLANT
BLADE SURG 11 STRL SS (BLADE) ×3 IMPLANT
BONE VIVIGEN FORMABLE 5.4CC (Bone Implant) ×3 IMPLANT
BUR CUTTER 7.0 ROUND (BURR) ×3 IMPLANT
BUR MATCHSTICK NEURO 3.0 LAGG (BURR) ×3 IMPLANT
CANISTER SUCT 3000ML PPV (MISCELLANEOUS) ×3 IMPLANT
CAP LOCKING THREADED (Cap) ×4 IMPLANT
CARTRIDGE OIL MAESTRO DRILL (MISCELLANEOUS) ×1 IMPLANT
CLOSURE WOUND 1/2 X4 (GAUZE/BANDAGES/DRESSINGS) ×1
CONT SPEC 4OZ CLIKSEAL STRL BL (MISCELLANEOUS) ×3 IMPLANT
COVER BACK TABLE 60X90IN (DRAPES) ×3 IMPLANT
COVER WAND RF STERILE (DRAPES) ×3 IMPLANT
DECANTER SPIKE VIAL GLASS SM (MISCELLANEOUS) ×3 IMPLANT
DERMABOND ADVANCED (GAUZE/BANDAGES/DRESSINGS) ×2
DERMABOND ADVANCED .7 DNX12 (GAUZE/BANDAGES/DRESSINGS) ×1 IMPLANT
DIFFUSER DRILL AIR PNEUMATIC (MISCELLANEOUS) ×3 IMPLANT
DRAPE C-ARM 42X72 X-RAY (DRAPES) ×6 IMPLANT
DRAPE C-ARMOR (DRAPES) IMPLANT
DRAPE HALF SHEET 40X57 (DRAPES) IMPLANT
DRAPE LAPAROTOMY 100X72X124 (DRAPES) ×3 IMPLANT
DRAPE SURG 17X23 STRL (DRAPES) ×3 IMPLANT
DRSG OPSITE 4X5.5 SM (GAUZE/BANDAGES/DRESSINGS) ×3 IMPLANT
DRSG OPSITE POSTOP 4X6 (GAUZE/BANDAGES/DRESSINGS) ×3 IMPLANT
DURAPREP 26ML APPLICATOR (WOUND CARE) ×3 IMPLANT
ELECT REM PT RETURN 9FT ADLT (ELECTROSURGICAL) ×3
ELECTRODE REM PT RTRN 9FT ADLT (ELECTROSURGICAL) ×1 IMPLANT
EVACUATOR 3/16  PVC DRAIN (DRAIN) ×2
EVACUATOR 3/16 PVC DRAIN (DRAIN) ×1 IMPLANT
GAUZE 4X4 16PLY RFD (DISPOSABLE) IMPLANT
GAUZE SPONGE 4X4 12PLY STRL (GAUZE/BANDAGES/DRESSINGS) ×3 IMPLANT
GLOVE BIO SURGEON STRL SZ7 (GLOVE) ×6 IMPLANT
GLOVE BIO SURGEON STRL SZ8 (GLOVE) ×6 IMPLANT
GLOVE BIOGEL PI IND STRL 7.0 (GLOVE) IMPLANT
GLOVE BIOGEL PI IND STRL 7.5 (GLOVE) IMPLANT
GLOVE BIOGEL PI IND STRL 8 (GLOVE) IMPLANT
GLOVE BIOGEL PI INDICATOR 7.0 (GLOVE) ×2
GLOVE BIOGEL PI INDICATOR 7.5 (GLOVE) ×2
GLOVE BIOGEL PI INDICATOR 8 (GLOVE) ×2
GLOVE ECLIPSE 7.5 STRL STRAW (GLOVE) ×2 IMPLANT
GLOVE EXAM NITRILE XL STR (GLOVE) IMPLANT
GLOVE INDICATOR 8.5 STRL (GLOVE) ×6 IMPLANT
GLOVE SURG SS PI 7.0 STRL IVOR (GLOVE) ×8 IMPLANT
GOWN STRL REUS W/ TWL LRG LVL3 (GOWN DISPOSABLE) IMPLANT
GOWN STRL REUS W/ TWL XL LVL3 (GOWN DISPOSABLE) ×2 IMPLANT
GOWN STRL REUS W/TWL 2XL LVL3 (GOWN DISPOSABLE) IMPLANT
GOWN STRL REUS W/TWL LRG LVL3 (GOWN DISPOSABLE) ×3
GOWN STRL REUS W/TWL XL LVL3 (GOWN DISPOSABLE) ×12
GRAFT BNE MATRIX VG FRMBL MD 5 (Bone Implant) IMPLANT
HEMOSTAT POWDER KIT SURGIFOAM (HEMOSTASIS) IMPLANT
KIT BASIN OR (CUSTOM PROCEDURE TRAY) ×3 IMPLANT
KIT INFUSE XX SMALL 0.7CC (Orthopedic Implant) ×2 IMPLANT
KIT TURNOVER KIT B (KITS) ×3 IMPLANT
MILL MEDIUM DISP (BLADE) ×3 IMPLANT
NDL HYPO 21X1.5 SAFETY (NEEDLE) ×1 IMPLANT
NDL HYPO 25X1 1.5 SAFETY (NEEDLE) ×1 IMPLANT
NEEDLE HYPO 21X1.5 SAFETY (NEEDLE) ×3 IMPLANT
NEEDLE HYPO 25X1 1.5 SAFETY (NEEDLE) ×3 IMPLANT
NS IRRIG 1000ML POUR BTL (IV SOLUTION) ×5 IMPLANT
OIL CARTRIDGE MAESTRO DRILL (MISCELLANEOUS) ×3
PACK LAMINECTOMY NEURO (CUSTOM PROCEDURE TRAY) ×3 IMPLANT
PAD ARMBOARD 7.5X6 YLW CONV (MISCELLANEOUS) ×9 IMPLANT
ROD 55MM (Rod) ×3 IMPLANT
ROD CREO 50MM (Rod) ×2 IMPLANT
ROD SPNL CVD 55X5.5XNS TI (Rod) IMPLANT
SCREW AMP MODULAR CREO 6.5X45 (Screw) ×4 IMPLANT
SCREW LOCK (Screw) ×3 IMPLANT
SCREW LOCK 100X5.5X OPN (Screw) IMPLANT
SCREW PA THRD CREO TULIP 5.5X4 (Head) ×4 IMPLANT
SPACER SUSTAIN RT TI 8X22X11 8 (Spacer) ×4 IMPLANT
SPONGE LAP 4X18 RFD (DISPOSABLE) IMPLANT
SPONGE SURGIFOAM ABS GEL 100 (HEMOSTASIS) ×3 IMPLANT
STRIP CLOSURE SKIN 1/2X4 (GAUZE/BANDAGES/DRESSINGS) ×3 IMPLANT
SUT VIC AB 0 CT1 18XCR BRD8 (SUTURE) ×2 IMPLANT
SUT VIC AB 0 CT1 8-18 (SUTURE) ×6
SUT VIC AB 2-0 CT1 18 (SUTURE) ×3 IMPLANT
SUT VIC AB 4-0 PS2 27 (SUTURE) ×3 IMPLANT
SYR 20ML LL LF (SYRINGE) IMPLANT
SYR CONTROL 10ML LL (SYRINGE) ×2 IMPLANT
TOWEL GREEN STERILE (TOWEL DISPOSABLE) ×3 IMPLANT
TOWEL GREEN STERILE FF (TOWEL DISPOSABLE) ×3 IMPLANT
TRAY FOLEY MTR SLVR 16FR STAT (SET/KITS/TRAYS/PACK) ×3 IMPLANT
TRAY FOLEY W/BAG SLVR 14FR (SET/KITS/TRAYS/PACK) ×2 IMPLANT
WATER STERILE IRR 1000ML POUR (IV SOLUTION) ×3 IMPLANT

## 2019-08-21 NOTE — Transfer of Care (Signed)
Immediate Anesthesia Transfer of Care Note  Patient: Chelsea Benson  Procedure(s) Performed: Posterior Lumbar Interbody Fusion - Lumbar Three-Lumbar Four, removal of hardware Lumbar Four-Sacral One (N/A Back)  Patient Location: PACU  Anesthesia Type:General  Level of Consciousness: awake, alert , oriented and patient cooperative  Airway & Oxygen Therapy: Patient Spontanous Breathing and Patient connected to face mask oxygen  Post-op Assessment: Report given to RN and Post -op Vital signs reviewed and stable  Post vital signs: Reviewed and stable--gave Fentanyl for pain and high BP  Last Vitals:  Vitals Value Taken Time  BP 167/110 08/21/19 1605  Temp    Pulse 84 08/21/19 1607  Resp 16 08/21/19 1607  SpO2 96 % 08/21/19 1607  Vitals shown include unvalidated device data.  Last Pain:  Vitals:   08/21/19 1100  TempSrc:   PainSc: 10-Worst pain ever      Patients Stated Pain Goal: 3 (01/56/15 3794)  Complications: No apparent anesthesia complications

## 2019-08-21 NOTE — H&P (Signed)
Chelsea Benson is an 68 y.o. female.   Chief Complaint: Back pain bilateral hip and leg pain HPI: 68 year old female previously undergone L4-S1 fusion did very well is a progressive worsening back bilateral hip and leg pain rating down L3-L4 nerve root pattern.  Work-up is revealed segmental degeneration above her previous fusion at L4-S1.  Due to her failed conservative treatment progression of clinical syndrome I recommended decompression stabilization procedure at L4-5.  I extensively gone over the risks and benefits of the operation with her as well as perioperative course expectations of outcome and alternatives of surgery and she understands and agrees to proceed forward.  Past Medical History:  Diagnosis Date  . Allergic rhinoconjunctivitis   . Allergy history unknown   . Anxiety   . Arthritis    DDD, spondylosis  . Asthma   . Bipolar disorder ([place])   . Chronic back pain    Has had several surgeries.  He uses a walker  . Chronic kidney disease    renal calculi  . Depression   . Diabetes mellitus without complication ([place])    dx in 2007  . Diabetes mellitus, type II ([place])   . Eczema   . Headache(784.0)    sinus related   . History of kidney stones   . Hx of blood clots    lungs  . Hx of pulmonary embolus 2017  . Hyperlipidemia   . Hypertension   . Insomnia   . Knee pain   . Left ankle pain   . Memory change   . Mental disorder   . Mood disorder ([place])   . OSA on CPAP    PSG 05/24/2016: AHI 17/hour 02 mean 76%.  HSAT 06/13/17, AHI 11-hour 02 mean 79%  . Recurrent upper respiratory infection (URI)   . Sleep apnea   . Tremor of both hands     Past Surgical History:  Procedure Laterality Date  . ABDOMINAL HYSTERECTOMY    . BACK SURGERY     2012- lumbar fusion  . BREAST SURGERY     L cyst removed - 1972  . BUNIONECTOMY Left    Great toe  . calf -R- cyst removed    . CARPAL TUNNEL RELEASE     both hands   . HAMMER TOE SURGERY Left    L foot  . NASAL SINUS  SURGERY    . NM MYOVIEW LTD  12/2008   Normal study.  Normal LV function.  EF 61%.  Apical thinning but no ischemia or infarction.  Marland Kitchen SHOULDER ARTHROSCOPY Left    R shoulder- RCR  . TOE SURGERY    . TOTAL KNEE ARTHROPLASTY Right 05/22/2017   Procedure: TOTAL KNEE ARTHROPLASTY;  Surgeon: Frederik Pear, MD;  Location: [facility];  Service: Orthopedics;  Laterality: Right;  . TRANSTHORACIC ECHOCARDIOGRAM  06/2009    Technically difficult.  Moderate concentric LVH with normal LV function.  No regional wall motion normalities.  EF> 55%.  Normal right ventricle.  No valve lesions.  Normal filling pressures.  Marland Kitchen TRIGGER FINGER RELEASE     L thumb  . TUBAL LIGATION      Family History  Problem Relation Age of Onset  . Diabetes Sister   . Diabetes Brother   . Hypertension Brother   . Cancer Maternal Grandmother   . Heart failure Maternal Grandmother   . Hypertension Paternal Grandmother   . Asthma Daughter   . Cancer Daughter   . Depression Daughter   . Hypertension Daughter   .  Heart failure Maternal Aunt   . Pneumonia Mother   . Diabetes Mother   . Alcoholism Father   . Alcohol abuse Father   . Depression Daughter   . Schizophrenia Grandchild   . Allergies Neg Hx   . Eczema Neg Hx   . Immunodeficiency Neg Hx    Social History:  reports that she is a non-smoker but has been exposed to tobacco smoke. She has never used smokeless tobacco. She reports current alcohol use. She reports that she does not use drugs.  Allergies:  Allergies  Allergen Reactions  . Carvedilol Cough  . Cymbalta [Duloxetine Hcl] Other (See Comments)    Caused hair loss  . Fish Allergy Itching    Halibut fish  . Morphine And Related Itching  . Rosuvastatin Calcium Other (See Comments) and Cough    Rapid heart rate  . Almond Meal Itching and Swelling    Tongue swells and itches  . Almond Oil Itching and Swelling    Itching and swelling of tongue   . Losartan Potassium Cough    Facility-Administered  Medications Prior to Admission  Medication Dose Route Frequency Provider Last Rate Last Dose  . omalizumab Arvid Right) injection 375 mg  375 mg Subcutaneous Q14 Days Kennith Gain, MD   375 mg at 02/12/19 1510   Medications Prior to Admission  Medication Sig Dispense Refill  . albuterol (PROVENTIL) (2.5 MG/3ML) 0.083% nebulizer solution Take 3 mLs (2.5 mg total) by nebulization every 4 (four) hours as needed for wheezing or shortness of breath. 375 mL 1  . ARIPiprazole (ABILIFY) 2 MG tablet Take 1 tablet (2 mg total) by mouth daily. 90 tablet 1  . aspirin EC 81 MG tablet Take 81 mg by mouth daily.    Marland Kitchen azelastine (ASTELIN) 0.1 % nasal spray Place 1 spray into both nostrils 2 (two) times daily. 30 mL 4  . budesonide-formoterol (SYMBICORT) 160-4.5 MCG/ACT inhaler INHALE 2 PUFFS INTO THE LUNGS TWICE DAILY (Patient taking differently: Inhale 2 puffs into the lungs 2 (two) times daily. ) 30.6 g 1  . busPIRone (BUSPAR) 15 MG tablet Take 1.5 tablets (22.5 mg total) by mouth 2 (two) times daily. 270 tablet 0  . CLOBETASOL PROPIONATE E 0.05 % emollient cream Apply 1 application topically 2 (two) times daily as needed (rash).     Marland Kitchen desonide (DESOWEN) 0.05 % ointment Apply 1 application topically 2 (two) times daily as needed. 60 g 5  . diltiazem (CARDIZEM CD) 180 MG 24 hr capsule Take 180 mg by mouth daily.     Marland Kitchen EPINEPHrine (EPIPEN 2-PAK) 0.3 mg/0.3 mL IJ SOAJ injection Use as directed for severe allergic reaction (Patient taking differently: Inject 0.3 mg into the muscle once as needed for anaphylaxis (severe allergic reaction). ) 2 Device 1  . famotidine (PEPCID) 20 MG tablet One after supper (Patient taking differently: Take 20 mg by mouth daily after supper. ) 30 tablet 11  . fexofenadine (ALLEGRA) 180 MG tablet Take 180 mg by mouth daily.    . fluticasone (FLONASE) 50 MCG/ACT nasal spray Use 1-2 sprays in each nostril in each nostril once daily (Patient taking differently: Place 2 sprays into  both nostrils 2 (two) times daily. ) 48 g 1  . hydrochlorothiazide (HYDRODIURIL) 25 MG tablet Take 25 mg by mouth daily.     . hydrOXYzine (ATARAX/VISTARIL) 25 MG tablet TAKE 1 TABLET(25 MG) BY MOUTH EVERY 6 HOURS AS NEEDED (Patient taking differently: Take 25 mg by mouth every 6 (six)  hours as needed for anxiety. ) 270 tablet 1  . Mepolizumab (NUCALA) 100 MG/ML SOAJ Inject 100 mg into the skin every 30 (thirty) days.    . metFORMIN (GLUCOPHAGE-XR) 500 MG 24 hr tablet Take 500 mg by mouth 2 (two) times daily with a meal.     . mirtazapine (REMERON) 30 MG tablet TAKE 1 TABLET(30 MG) BY MOUTH AT BEDTIME (Patient taking differently: Take 30 mg by mouth at bedtime. ) 90 tablet 1  . montelukast (SINGULAIR) 10 MG tablet Take 1 tablet (10 mg total) by mouth at bedtime. 90 tablet 0  . Olopatadine HCl (PAZEO) 0.7 % SOLN Apply 1 drop to eye daily as needed. (Patient taking differently: Apply 1 drop to eye daily as needed (allergies). ) 2.5 mL 1  . Oxcarbazepine (TRILEPTAL) 300 MG tablet Take 2 tablets (600 mg total) by mouth at bedtime. 90 tablet 1  . oxybutynin (DITROPAN-XL) 10 MG 24 hr tablet Take 10 mg by mouth daily.     Marland Kitchen oxyCODONE-acetaminophen (PERCOCET) 10-325 MG tablet Take 1 tablet by mouth every 8 (eight) hours as needed for pain.     Marland Kitchen OZEMPIC, 0.25 OR 0.5 MG/DOSE, 2 MG/1.5ML SOPN Inject 0.5 mg into the skin every 7 (seven) days.     . pantoprazole (PROTONIX) 40 MG tablet Take 1 tablet (40 mg total) by mouth daily. Take 30-60 min before first meal of the day 30 tablet 2  . potassium chloride SA (K-DUR) 20 MEQ tablet Take 20 mEq by mouth daily.    . pravastatin (PRAVACHOL) 20 MG tablet Take 20 mg by mouth at bedtime.     . pregabalin (LYRICA) 100 MG capsule Take 100 mg by mouth 3 (three) times daily.    Marland Kitchen zolpidem (AMBIEN) 10 MG tablet Take 1/2-1 tab (Patient taking differently: Take 5-10 mg by mouth at bedtime as needed for sleep. ) 90 tablet 1  . ACCU-CHEK GUIDE test strip TEST BID PRN  1  .  Blood Glucose Monitoring Suppl (ACCU-CHEK GUIDE) w/Device KIT See admin instructions.  1  . Respiratory Therapy Supplies (NEBULIZER) DEVI Use as directed with nebulizer solution. 1 each 1  . Respiratory Therapy Supplies (NEBULIZER/TUBING/MOUTHPIECE) KIT Use as directed with nebulizer machine 1 kit 12    Results for orders placed or performed during the hospital encounter of 08/21/19 (from the past 48 hour(s))  Glucose, capillary     Status: Abnormal   Collection Time: 08/21/19 10:44 AM  Result Value Ref Range   Glucose-Capillary 121 (H) 70 - 99 mg/dL   No results found.  Review of Systems  Musculoskeletal: Positive for back pain.  Neurological: Positive for tingling and sensory change.    Blood pressure (!) 176/108, pulse 82, temperature 98.4 F (36.9 C), temperature source Oral, resp. rate 18, height 5' 4" (1.626 m), weight 84.1 kg, SpO2 100 %. Physical Exam  Constitutional: She is oriented to person, place, and time. She appears well-developed.  HENT:  Head: Normocephalic.  Eyes: Pupils are equal, round, and reactive to light.  Neck: Normal range of motion.  Respiratory: Effort normal.  GI: Soft.  Neurological: She is alert and oriented to person, place, and time. She has normal strength. GCS eye subscore is 4. GCS verbal subscore is 5. GCS motor subscore is 6.  Strength 5 out of 5 iliopsoas, quads, hamstrings, gastrocs, into tibialis, and EHL.  Skin: Skin is warm and dry.     Assessment/Plan 68 year old presents for L3-4 decompression fusion removal of hardware L4-S1  Chelsea Benson  P, MD 08/21/2019, 12:12 PM

## 2019-08-21 NOTE — Anesthesia Procedure Notes (Signed)
Procedure Name: Intubation Date/Time: 08/21/2019 12:53 PM Performed by: Raenette Rover, CRNA Pre-anesthesia Checklist: Patient identified, Emergency Drugs available, Suction available and Patient being monitored Patient Re-evaluated:Patient Re-evaluated prior to induction Oxygen Delivery Method: Circle system utilized Preoxygenation: Pre-oxygenation with 100% oxygen Induction Type: IV induction Ventilation: Mask ventilation without difficulty Laryngoscope Size: Miller and 2 Grade View: Grade I Tube type: Oral Tube size: 7.0 mm Number of attempts: 1 Airway Equipment and Method: Stylet Placement Confirmation: ETT inserted through vocal cords under direct vision,  positive ETCO2 and breath sounds checked- equal and bilateral Secured at: 21 cm Tube secured with: Tape Dental Injury: Teeth and Oropharynx as per pre-operative assessment  Comments: Larynx slightly to the left.

## 2019-08-21 NOTE — Op Note (Signed)
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  Decompressive lumbar laminectomy L3-4 with complete medial facetectomies and radical foraminotomies of the L3 and L4 nerve roots in excess and requiring more work than would be needed with a standard interbody fusion.  3.  Posterior lumbar interbody fusion L3-4 utilizing insert and rotate globus titanium cages packed with locally harvested autograft mixed with vivigen and BMP  4.  Pedicle screw fixation L3-L4 utilizing the residual invasive which on the left side was a old percutaneous pedicle screw and on the right side was a newer version of NuVasive pedicle screw with replacement of new globus L3 pedicle screws Creo amp  5.  Posterior lateral arthrodesis L3-4 utilizing the remainder the locally harvested autograft mixed with division and BMP  Surgeon: Dominica Severin Girtrude Enslin  Assistant: Jovita Gamma  Anesthesia: General  EBL: Minimal  HPI: 68 year old female longstanding back and bilateral hip and leg pain rating down in an L3 and L4 nerve root pattern with work-up revealing segmental instability at L3-4 above her previous fusion.  Her old fusion did appear to be solid on CT scan.  Because of this and failed conservative treatment and progression of clinical syndrome I recommended decompressive laminectomy at L3-4 with exploration of fusion removal of hardware L4-S1.  I extensively gone over the risks and benefits of that procedure with the patient as well as perioperative course expectations of outcome and alternatives of surgery and she understood and agreed to proceed forward.  Operative procedure: Patient was brought into the OR was due to general anesthesia positioned prone Wilson frame her back was prepped and draped in  routine sterile fashion roll incision was opened up and extended cephalad subperiosteal dissection carried lamina of L2 and L3 as well as exposed the hardware from L4-S1.  I then disconnected the knots and remove the rods and removed bilateral L5 and S1 screws.  I then performed complete decompressive laminectomy at L3.  With complete medial facetectomies and radical foraminotomies of the L3 and L4 nerve roots there was marked spinal stenosis severe hourglass compression of thecal sac coming off large spurs come off the medial facet complex.  These were all removed decompressing both the L3 and L4 nerve roots I aggressively under bit the supra articulating facet at L3-4 to gain access lateral margin to space.  This space was then cleaned out with 210 rongeurs utilizing sequential distraction under fluoroscopy I inserted to insert and rotate cages that were packed with locally harvested autograft as well as packing the local autograft centrally.  After all the interbody work be done I placed 2 pedicle screws at L3 and start in routine fashion under fluoroscopy.  Initial postop fluoroscopy confirmed good position the implants except the right side implant was a bit laterally displaced so I removed it and repositioned it medially.  I then compressed the L4 screw against L5 and tightened everything down.  I reinspected the foramina to confirm patency I aggressively decorticated the facets and lateral TPs and lateral gutters Paya packed the remainder the locally harvested autograft with BMP posterior laterally.  Then placed a medium Hemovac drain injected Exparel in the fascia and closed wound in layers with Vicryl running 4 subcuticular Dermabond benzoin Steri-Strips and a sterile dressing.  At the end the case on the account sponge counts were correct.

## 2019-08-22 LAB — GLUCOSE, CAPILLARY
Glucose-Capillary: 159 mg/dL — ABNORMAL HIGH (ref 70–99)
Glucose-Capillary: 94 mg/dL (ref 70–99)
Glucose-Capillary: 144 mg/dL — ABNORMAL HIGH (ref 70–99)
Glucose-Capillary: 187 mg/dL — ABNORMAL HIGH (ref 70–99)

## 2019-08-22 MED ORDER — PROPOFOL 1000 MG/100ML IV EMUL
INTRAVENOUS | Status: AC
  Filled 2019-08-22: qty 100

## 2019-08-22 NOTE — Evaluation (Signed)
Physical Therapy Evaluation Patient Details Name: Chelsea PorteousFrankie M Maultsby MRN: [id number] DOB: 11/12/1951 Today's Date: 08/22/2019   History of Present Illness  68 year old presents for L3-4 decompression fusion removal of hardware L4-S1  Clinical Impression  Pt admitted with above diagnosis. At the time of PT eval pt was able to perform transfers and ambulation with gross min guard assist for balance support and safety without an AD. Pt was educated on precautions, brace application/wearing schedule, activity progression, and car transfer. Pt currently with functional limitations due to the deficits listed below (see PT Problem List). Pt will benefit from skilled PT to increase their independence and safety with mobility to allow discharge to the venue listed below.       Follow Up Recommendations No PT follow up;Supervision for mobility/OOB    Equipment Recommendations  None recommended by PT    Recommendations for Other Services       Precautions / Restrictions Precautions Precautions: Back Precaution Booklet Issued: Yes (comment) Precaution Comments: reviewed handout in full Required Braces or Orthoses: Spinal Brace Spinal Brace: Lumbar corset;Applied in sitting position Restrictions Weight Bearing Restrictions: No      Mobility  Bed Mobility               General bed mobility comments: Pt OOB for entire session, reviewed verbally and when going over handout  Transfers Overall transfer level: Needs assistance Equipment used: None Transfers: Sit to/from Stand Sit to Stand: Min guard         General transfer comment: Close guard for safety as pt powered up to full standing position.   Ambulation/Gait Ambulation/Gait assistance: Min guard Gait Distance (Feet): 400 Feet Assistive device: None Gait Pattern/deviations: Step-through pattern;Decreased stride length;Trunk flexed Gait velocity: Decreased Gait velocity interpretation: 1.31 - 2.62 ft/sec, indicative of  limited community ambulator General Gait Details: VC's for improved posture. Pt was able to ambualte in hall well without overt LOB. VC's required to maintain back precautions throughout.   Stairs            Wheelchair Mobility    Modified Rankin (Stroke Patients Only)       Balance Overall balance assessment: Mild deficits observed, not formally tested                                           Pertinent Vitals/Pain Pain Assessment: Faces Pain Score: 6  Faces Pain Scale: Hurts little more Pain Location: incision site Pain Descriptors / Indicators: Sore Pain Intervention(s): Monitored during session;Repositioned;Limited activity within patient's tolerance    Home Living Family/patient expects to be discharged to:: Private residence Living Arrangements: Other relatives(brother, daughter also available to assist) Available Help at Discharge: Available PRN/intermittently(brother works 2am to 2pm) Type of Home: House Home Access: Level entry(If goes around to the porch)     Home Layout: One level Home Equipment: Environmental consultantWalker - 2 wheels;Shower seat;Grab bars - tub/shower;Adaptive equipment      Prior Function Level of Independence: Independent with assistive device(s)         Comments: rollator for mobility and chair     Hand Dominance   Dominant Hand: Right    Extremity/Trunk Assessment   Upper Extremity Assessment Upper Extremity Assessment: Defer to OT evaluation    Lower Extremity Assessment Lower Extremity Assessment: Generalized weakness    Cervical / Trunk Assessment Cervical / Trunk Assessment: Other exceptions Cervical / Trunk Exceptions: s/p  sx  Communication   Communication: No difficulties  Cognition Arousal/Alertness: Awake/alert Behavior During Therapy: WFL for tasks assessed/performed Overall Cognitive Status: Impaired/Different from baseline Area of Impairment: Attention;Safety/judgement;Awareness                    Current Attention Level: Selective     Safety/Judgement: Decreased awareness of safety Awareness: Emergent   General Comments: requires mod cues for back precautions, Pt able to state - but struggles to maintain      General Comments      Exercises     Assessment/Plan    PT Assessment Patient needs continued PT services  PT Problem List Decreased strength;Decreased activity tolerance;Decreased mobility;Decreased balance;Decreased knowledge of use of DME;Decreased safety awareness;Decreased knowledge of precautions;Pain       PT Treatment Interventions DME instruction;Gait training;Therapeutic activities;Functional mobility training;Therapeutic exercise;Patient/family education;Neuromuscular re-education    PT Goals (Current goals can be found in the Care Plan section)  Acute Rehab PT Goals Patient Stated Goal: get better, get back home PT Goal Formulation: With patient Time For Goal Achievement: 08/29/19 Potential to Achieve Goals: Good    Frequency Min 5X/week   Barriers to discharge        Co-evaluation               AM-PAC PT "6 Clicks" Mobility  Outcome Measure Help needed turning from your back to your side while in a flat bed without using bedrails?: None Help needed moving from lying on your back to sitting on the side of a flat bed without using bedrails?: None Help needed moving to and from a bed to a chair (including a wheelchair)?: A Little Help needed standing up from a chair using your arms (e.g., wheelchair or bedside chair)?: A Little Help needed to walk in hospital room?: A Little Help needed climbing 3-5 steps with a railing? : A Little 6 Click Score: 20    End of Session Equipment Utilized During Treatment: Gait belt;Back brace Activity Tolerance: Patient tolerated treatment well Patient left: with call bell/phone within reach(Sitting EOB awaiting OT) Nurse Communication: Mobility status PT Visit Diagnosis: Unsteadiness on feet  (R26.81);Pain;Difficulty in walking, not elsewhere classified (R26.2) Pain - part of body: (back)    Time: 3154-0086 PT Time Calculation (min) (ACUTE ONLY): 21 min   Charges:   PT Evaluation $PT Eval Moderate Complexity: 1 Mod          Rolinda Roan, PT, DPT Acute Rehabilitation Services Pager: [phone] Office: [phone]   Thelma Comp 08/22/2019, 11:51 AM

## 2019-08-22 NOTE — Progress Notes (Signed)
Subjective: Patient reports Complete resolution of preoperative leg pain condition of back soreness  Objective: Vital signs in last 24 hours: Temp:  [97.2 F (36.2 C)-98.4 F (36.9 C)] 98.3 F (36.8 C) (09/17 0348) Pulse Rate:  [71-91] 77 (09/17 0348) Resp:  [12-20] 18 (09/17 0348) BP: (112-180)/(70-118) 112/70 (09/17 0348) SpO2:  [92 %-100 %] 96 % (09/17 0348) Weight:  [84.1 kg] 84.1 kg (09/16 1041)  Intake/Output from previous day: 09/16 0701 - 09/17 0700 In: 1600 [I.V.:1400; IV Piggyback:200] Out: 980 [Urine:200; Drains:480; Blood:300] Intake/Output this shift: No intake/output data recorded.  Awake and alert incision clean dry and intact strength 5 out of 5 drain output pretty high at 480  Lab Results: No results for input(s): WBC, HGB, HCT, PLT in the last 72 hours. BMET No results for input(s): NA, K, CL, CO2, GLUCOSE, BUN, CREATININE, CALCIUM in the last 72 hours.  Studies/Results: Dg Lumbar Spine 2-3 Views  Result Date: 08/21/2019 CLINICAL DATA:  L3-4 PLIF with hardware removal EXAM: LUMBAR SPINE - 2-3 VIEW; DG C-ARM 1-60 MIN COMPARISON:  MRI 06/10/2019, CT 03/29/2017 FINDINGS: Two low resolution intraoperative spot views of the lumbar spine. Total fluoroscopy time was 38 seconds. Previously noted posterior rods and screws at L5 and S1 are removed. Interbody devices remain at L4-L5 and L5-S1. Fixating screws present at L3-L4 with interbody device. Sponge material on AP view at the lower lumbar spine. IMPRESSION: Intraoperative fluoroscopic assistance provided during lumbar spine surgery Electronically Signed   By: Donavan Foil M.D.   On: 08/21/2019 16:05   Dg C-arm 1-60 Min  Result Date: 08/21/2019 CLINICAL DATA:  L3-4 PLIF with hardware removal EXAM: LUMBAR SPINE - 2-3 VIEW; DG C-ARM 1-60 MIN COMPARISON:  MRI 06/10/2019, CT 03/29/2017 FINDINGS: Two low resolution intraoperative spot views of the lumbar spine. Total fluoroscopy time was 38 seconds. Previously noted  posterior rods and screws at L5 and S1 are removed. Interbody devices remain at L4-L5 and L5-S1. Fixating screws present at L3-L4 with interbody device. Sponge material on AP view at the lower lumbar spine. IMPRESSION: Intraoperative fluoroscopic assistance provided during lumbar spine surgery Electronically Signed   By: Donavan Foil M.D.   On: 08/21/2019 16:05    Assessment/Plan: Continue to mobilize with physical and Occupational Therapy continue Hemovac drain if output slows down possible discharge tomorrow  LOS: 1 day     Kent Riendeau P 08/22/2019, 7:28 AM

## 2019-08-22 NOTE — Evaluation (Signed)
Occupational Therapy Evaluation Patient Details Name: Chelsea Benson MRN: [id number] DOB: 08/31/1951 Today's Date: 08/22/2019    History of Present Illness 68 year old presents for L3-4 decompression fusion removal of hardware L4-S1   Clinical Impression   PTA Pt mod I with Rollator and various other DME. Pt today is overall min guard for transfers, grooming, LB dressing, toileting (including peri care). Pt is mod A to don/doff/manage brace. Pt will benefit from skilled OT in the acute setting and will require a 3 in 1 for safe dc home. Back handout provided and reviewed adls in detail. Pt educated on: clothing between brace, never sleep in brace, set an alarm at night for medication, avoid sitting for long periods of time, correct bed positioning for sleeping, correct sequence for bed mobility (verbal, did not practice), avoiding lifting more than 5 pounds and never wash directly over incision. Ot will follow acutely, and do not anticipate the need for follow-up post acute.     Follow Up Recommendations  Supervision - Intermittent    Equipment Recommendations  3 in 1 bedside commode(grabber/reacher provided to Pt)    Recommendations for Other Services PT consult     Precautions / Restrictions Precautions Precautions: Back Precaution Booklet Issued: Yes (comment) Precaution Comments: reviewed handout in full Required Braces or Orthoses: Spinal Brace Spinal Brace: Lumbar corset;Applied in sitting position Restrictions Weight Bearing Restrictions: No      Mobility Bed Mobility               General bed mobility comments: Pt OOB for entire session, reviewed verbally and when going over handout  Transfers Overall transfer level: Needs assistance Equipment used: None Transfers: Sit to/from Stand Sit to Stand: Min guard;From elevated surface              Balance Overall balance assessment: Mild deficits observed, not formally tested                                          ADL either performed or assessed with clinical judgement   ADL Overall ADL's : Needs assistance/impaired Eating/Feeding: Independent   Grooming: Wash/dry hands;Wash/dry face;Oral care;Applying deodorant;Brushing hair;Min guard;Cueing for compensatory techniques Grooming Details (indicate cue type and reason): cues for back precautions, education for compensatory strategies Upper Body Bathing: Minimal assistance;Standing Upper Body Bathing Details (indicate cue type and reason): for upper back Lower Body Bathing: Min guard;Sitting/lateral leans Lower Body Bathing Details (indicate cue type and reason): able to perform figure 4 and reach peri area Upper Body Dressing : Moderate assistance;Standing Upper Body Dressing Details (indicate cue type and reason): mod cues to manage brace and don/doff correctly, able to get shirt over head without assist Lower Body Dressing: Min guard;Sit to/from stand Lower Body Dressing Details (indicate cue type and reason): able to don underwear, pj pants, slide on shoes, demonstrate figure 4 - also has AE for LB dressing from previous sx Toilet Transfer: Min guard;Ambulation;BSC Toilet Transfer Details (indicate cue type and reason): 3 in 1 over toilet for additional height and arm rests Toileting- ArchitectClothing Manipulation and Hygiene: Min guard;Sit to/from stand       Functional mobility during ADLs: Min guard General ADL Comments: educated in compensatory strategies for ADL, requires mod cues for back precautions throughout session      Vision Baseline Vision/History: Wears glasses Wears Glasses: Reading only Patient Visual Report: No change from baseline  Perception     Praxis      Pertinent Vitals/Pain Pain Assessment: 0-10 Pain Score: 6  Pain Location: incision site Pain Descriptors / Indicators: Sore Pain Intervention(s): Monitored during session;Premedicated before session;Repositioned     Hand Dominance  Right   Extremity/Trunk Assessment Upper Extremity Assessment Upper Extremity Assessment: Overall WFL for tasks assessed   Lower Extremity Assessment Lower Extremity Assessment: Defer to PT evaluation   Cervical / Trunk Assessment Cervical / Trunk Assessment: Other exceptions Cervical / Trunk Exceptions: s/p sx   Communication Communication Communication: No difficulties   Cognition Arousal/Alertness: Awake/alert Behavior During Therapy: WFL for tasks assessed/performed Overall Cognitive Status: Impaired/Different from baseline Area of Impairment: Attention;Safety/judgement;Awareness                   Current Attention Level: Selective     Safety/Judgement: Decreased awareness of safety Awareness: Emergent   General Comments: requires mod cues for back precautions, Pt able to state - but struggles to maintain   General Comments       Exercises     Shoulder Instructions      Home Living Family/patient expects to be discharged to:: Private residence Living Arrangements: Other relatives(brother, daughter also available to assist) Available Help at Discharge: Available PRN/intermittently(brother works 2am to 2pm) Type of Home: House       Home Layout: One level     Bathroom Shower/Tub: Corporate investment banker: Standard Bathroom Accessibility: Yes How Accessible: Accessible via walker Home Equipment: [place] - 2 wheels;Shower seat;Grab bars - tub/shower;Adaptive equipment Adaptive Equipment: Reacher;Sock aid;Long-handled shoe horn;Long-handled sponge        Prior Functioning/Environment Level of Independence: Independent with assistive device(s)        Comments: rollator for mobility and chair        OT Problem List: Decreased range of motion;Decreased activity tolerance;Impaired balance (sitting and/or standing);Decreased safety awareness;Decreased knowledge of use of DME or AE;Decreased knowledge of precautions;Pain      OT  Treatment/Interventions: Self-care/ADL training;DME and/or AE instruction;Therapeutic activities;Patient/family education;Balance training    OT Goals(Current goals can be found in the care plan section) Acute Rehab OT Goals Patient Stated Goal: get better, get back home OT Goal Formulation: With patient Time For Goal Achievement: 09/05/19 Potential to Achieve Goals: Good ADL Goals Pt Will Perform Grooming: with modified independence;standing Pt Will Perform Upper Body Dressing: with modified independence;standing;sitting Pt Will Perform Lower Body Dressing: with modified independence;sit to/from stand Pt Will Transfer to Toilet: with modified independence;ambulating Pt Will Perform Toileting - Clothing Manipulation and hygiene: with modified independence;sit to/from stand Additional ADL Goal #1: Pt will perform bed mobility at mod I level prior to engaging in ADL Additional ADL Goal #2: Pt will recall 3/3 back precautions and maintain with less than 2 cues during ADL routine.  OT Frequency: Min 2X/week   Barriers to D/C:            Co-evaluation              AM-PAC OT "6 Clicks" Daily Activity     Outcome Measure Help from another person eating meals?: None Help from another person taking care of personal grooming?: A Little Help from another person toileting, which includes using toliet, bedpan, or urinal?: A Little Help from another person bathing (including washing, rinsing, drying)?: A Little Help from another person to put on and taking off regular upper body clothing?: A Lot Help from another person to put on and taking off regular lower body  clothing?: A Little 6 Click Score: 18   End of Session Equipment Utilized During Treatment: Back brace Nurse Communication: Mobility status  Activity Tolerance: Patient tolerated treatment well Patient left: in chair;with call bell/phone within reach(painting nails)  OT Visit Diagnosis: Other abnormalities of gait and mobility  (R26.89);Muscle weakness (generalized) (M62.81);Other symptoms and signs involving cognitive function;Pain Pain - Right/Left: (central) Pain - part of body: (lumbar spine)                Time: 6553-7482 OT Time Calculation (min): 40 min Charges:  OT General Charges $OT Visit: 1 Visit OT Evaluation $OT Eval Moderate Complexity: 1 Mod OT Treatments $Self Care/Home Management : 23-37 mins  Sherryl Manges OTR/L Acute Rehabilitation Services Pager: [phone] Office: [phone]  Evern Bio Rozetta Stumpp 08/22/2019, 11:30 AM

## 2019-08-22 NOTE — Anesthesia Postprocedure Evaluation (Signed)
Anesthesia Post Note  Patient: Chelsea Benson  Procedure(s) Performed: Posterior Lumbar Interbody Fusion - Lumbar Three-Lumbar Four, removal of hardware Lumbar Four-Sacral One (N/A Back)     Patient location during evaluation: PACU Anesthesia Type: General Level of consciousness: awake and alert Pain management: pain level controlled Vital Signs Assessment: post-procedure vital signs reviewed and stable Respiratory status: spontaneous breathing, nonlabored ventilation, respiratory function stable and patient connected to nasal cannula oxygen Cardiovascular status: blood pressure returned to baseline and stable Postop Assessment: no apparent nausea or vomiting Anesthetic complications: no    Last Vitals:  Vitals:   08/22/19 1209 08/22/19 1546  BP: 108/70 (!) 104/58  Pulse: 90 81  Resp: 19 19  Temp: 36.9 C 37 C  SpO2: 98% 98%    Last Pain:  Vitals:   08/22/19 1800  TempSrc:   PainSc: [place]

## 2019-08-23 LAB — GLUCOSE, CAPILLARY: Glucose-Capillary: 151 mg/dL — ABNORMAL HIGH (ref 70–99)

## 2019-08-23 MED ORDER — TIZANIDINE HCL 2 MG PO TABS: 2.0000 mg | ORAL_TABLET | Freq: Four times a day (QID) | ORAL | 0 refills | Status: DC | PRN

## 2019-08-23 MED ORDER — HYDROCODONE-ACETAMINOPHEN 5-325 MG PO TABS: 1.0000 | ORAL_TABLET | ORAL | 0 refills | Status: DC | PRN

## 2019-08-23 NOTE — Progress Notes (Signed)
Physical Therapy Treatment Patient Details Name: Chelsea PorteousFrankie M Benson MRN: [id number] DOB: 10/31/1951 Today's Date: 08/23/2019    History of Present Illness 68 year old presents for L3-4 decompression fusion removal of hardware L4-S1    PT Comments    Pt progressing towards physical therapy goals. Was able to ambulate well in hallway this session however continues to require occasional cues for maintenance of precautions. Reinforced education however pt dismissive at times, and reports she "knows all of that" since she's been through back surgery before. Will continue to follow and progress as able per POC.   Follow Up Recommendations  No PT follow up;Supervision for mobility/OOB     Equipment Recommendations  None recommended by PT    Recommendations for Other Services       Precautions / Restrictions Precautions Precautions: Back Precaution Booklet Issued: Yes (comment) Precaution Comments: reviewed handout in full Required Braces or Orthoses: Spinal Brace Spinal Brace: Lumbar corset;Applied in sitting position Restrictions Weight Bearing Restrictions: No    Mobility  Bed Mobility Overal bed mobility: Needs Assistance Bed Mobility: Rolling;Sidelying to Sit Rolling: Supervision Sidelying to sit: Supervision       General bed mobility comments: Slow but able to complete with min use of rails.   Transfers Overall transfer level: Needs assistance Equipment used: None Transfers: Sit to/from Stand Sit to Stand: Supervision         General transfer comment: Light supervision for safety as pt powered up to full standing position.   Ambulation/Gait Ambulation/Gait assistance: Supervision Gait Distance (Feet): 400 Feet Assistive device: None Gait Pattern/deviations: Step-through pattern;Decreased stride length;Trunk flexed Gait velocity: Decreased Gait velocity interpretation: 1.31 - 2.62 ft/sec, indicative of limited community ambulator General Gait Details: VC's for  improved posture. Pt was able to ambulate in hall well without overt LOB. VC's required to maintain back precautions throughout.    Stairs             Wheelchair Mobility    Modified Rankin (Stroke Patients Only)       Balance Overall balance assessment: Mild deficits observed, not formally tested                                          Cognition Arousal/Alertness: Awake/alert Behavior During Therapy: WFL for tasks assessed/performed Overall Cognitive Status: Impaired/Different from baseline Area of Impairment: Attention;Safety/judgement;Awareness                   Current Attention Level: Selective     Safety/Judgement: Decreased awareness of safety Awareness: Emergent   General Comments: requires mod cues for back precautions, Pt able to state - but struggles to maintain      Exercises      General Comments        Pertinent Vitals/Pain Pain Assessment: Faces Faces Pain Scale: Hurts a little bit Pain Location: incision site Pain Descriptors / Indicators: Sore Pain Intervention(s): Monitored during session    Home Living                      Prior Function            PT Goals (current goals can now be found in the care plan section) Acute Rehab PT Goals Patient Stated Goal: get better, get back home PT Goal Formulation: With patient Time For Goal Achievement: 08/29/19 Potential to Achieve Goals: Good Progress towards PT goals: Progressing  toward goals    Frequency    Min 5X/week      PT Plan Current plan remains appropriate    Co-evaluation              AM-PAC PT "6 Clicks" Mobility   Outcome Measure  Help needed turning from your back to your side while in a flat bed without using bedrails?: None Help needed moving from lying on your back to sitting on the side of a flat bed without using bedrails?: None Help needed moving to and from a bed to a chair (including a wheelchair)?: A Little Help  needed standing up from a chair using your arms (e.g., wheelchair or bedside chair)?: A Little Help needed to walk in hospital room?: A Little Help needed climbing 3-5 steps with a railing? : A Little 6 Click Score: 20    End of Session Equipment Utilized During Treatment: Gait belt;Back brace Activity Tolerance: Patient tolerated treatment well Patient left: with call bell/phone within reach(Sitting EOB awaiting OT) Nurse Communication: Mobility status PT Visit Diagnosis: Unsteadiness on feet (R26.81);Pain;Difficulty in walking, not elsewhere classified (R26.2) Pain - part of body: (back)     Time: 6063-0160 PT Time Calculation (min) (ACUTE ONLY): 15 min  Charges:  $Gait Training: 8-22 mins                     Rolinda Roan, PT, DPT Acute Rehabilitation Services Pager: [phone] Office: [phone]    Thelma Comp 08/23/2019, 2:25 PM

## 2019-08-23 NOTE — Plan of Care (Signed)
Patient alert and oriented, mae's well, voiding adequate amount of urine, swallowing without difficulty, no c/o pain at time of discharge. Patient discharged home with family. Script and discharged instructions given to patient. Patient and family stated understanding of instructions given. Patient has an appointment with Dr. Cram 

## 2019-08-23 NOTE — Progress Notes (Signed)
Occupational Therapy Treatment Patient Details Name: Chelsea Benson MRN: [id number] DOB: 1951/11/24 Today's Date: 08/23/2019    History of present illness 68 year old presents for L3-4 decompression fusion removal of hardware L4-S1   OT comments  Pt progressing towards OT goals this session. Min guard for transfers, min guard for sink level grooming. Pt continues to require cues to maintain back precautions (talked about how we have to break old habits/muscle memory) Pt with no further questions or concerns for OT at end of session. Current POC remains appropriate.  Follow Up Recommendations  Supervision - Intermittent    Equipment Recommendations  3 in 1 bedside commode    Recommendations for Other Services PT consult    Precautions / Restrictions Precautions Precautions: Back Precaution Booklet Issued: Yes (comment) Precaution Comments: reviewed handout in full Required Braces or Orthoses: Spinal Brace Spinal Brace: Lumbar corset;Applied in sitting position Restrictions Weight Bearing Restrictions: No       Mobility Bed Mobility               General bed mobility comments: Pt OOB for entire session, reviewed verbally and when going over handout  Transfers Overall transfer level: Needs assistance Equipment used: None Transfers: Sit to/from Stand Sit to Stand: Min guard         General transfer comment: Close guard for safety as pt powered up to full standing position.     Balance Overall balance assessment: Mild deficits observed, not formally tested                                         ADL either performed or assessed with clinical judgement   ADL Overall ADL's : Needs assistance/impaired     Grooming: Wash/dry hands;Wash/dry face;Oral care;Applying deodorant;Brushing hair;Min guard;Cueing for compensatory techniques Grooming Details (indicate cue type and reason): cues for back precautions, education for compensatory strategies                 Toilet Transfer: Min guard;Ambulation;BSC Toilet Transfer Details (indicate cue type and reason): 3 in 1 over toilet for additional height and arm rests Toileting- Water quality scientist and Hygiene: Min guard;Sit to/from stand       Functional mobility during ADLs: Min guard General ADL Comments: reinforced compensatory strategies for ADL, requires mod cues for back precautions throughout session      Vision       Perception     Praxis      Cognition Arousal/Alertness: Awake/alert Behavior During Therapy: WFL for tasks assessed/performed Overall Cognitive Status: Impaired/Different from baseline Area of Impairment: Attention;Safety/judgement;Awareness                   Current Attention Level: Selective     Safety/Judgement: Decreased awareness of safety Awareness: Emergent   General Comments: requires mod cues for back precautions, Pt able to state - but struggles to maintain        Exercises     Shoulder Instructions       General Comments      Pertinent Vitals/ Pain       Pain Assessment: Faces Faces Pain Scale: Hurts a little bit Pain Location: incision site Pain Descriptors / Indicators: Sore Pain Intervention(s): Monitored during session;Repositioned  Home Living  Prior Functioning/Environment              Frequency  Min 2X/week        Progress Toward Goals  OT Goals(current goals can now be found in the care plan section)  Progress towards OT goals: Progressing toward goals  Acute Rehab OT Goals Patient Stated Goal: get better, get back home OT Goal Formulation: With patient Time For Goal Achievement: 09/05/19 Potential to Achieve Goals: Good  Plan Discharge plan remains appropriate;Frequency remains appropriate    Co-evaluation                 AM-PAC OT "6 Clicks" Daily Activity     Outcome Measure   Help from another person eating  meals?: None Help from another person taking care of personal grooming?: A Little Help from another person toileting, which includes using toliet, bedpan, or urinal?: A Little Help from another person bathing (including washing, rinsing, drying)?: A Little Help from another person to put on and taking off regular upper body clothing?: A Little Help from another person to put on and taking off regular lower body clothing?: A Little 6 Click Score: 19    End of Session Equipment Utilized During Treatment: Back brace  OT Visit Diagnosis: Other abnormalities of gait and mobility (R26.89);Muscle weakness (generalized) (M62.81);Other symptoms and signs involving cognitive function;Pain Pain - Right/Left: (central) Pain - part of body: (lumbar spine)   Activity Tolerance Patient tolerated treatment well   Patient Left in chair;with call bell/phone within reach   Nurse Communication Mobility status        Time: [id number] OT Time Calculation (min): 12 min  Charges: OT General Charges $OT Visit: 1 Visit OT Treatments $Self Care/Home Management : 8-22 mins  Sherryl MangesLaura Khaled Herda OTR/L Acute Rehabilitation Services Pager: [phone] Office: [phone]   Evern BioLaura J Kristien Salatino 08/23/2019, 11:24 AM

## 2019-08-23 NOTE — Discharge Instructions (Signed)

## 2019-08-23 NOTE — Discharge Summary (Signed)
Physician Discharge Summary  Patient ID: Chelsea Benson MRN: [id number] DOB/AGE: Jul 29, 1951 68 y.o.  Admit date: 08/21/2019 Discharge date: 08/23/2019  Admission Diagnoses: Segmental instability lumbar spinal stenosis L3-4     Discharge Diagnoses: same   Discharged Condition: good  Hospital Course: The patient was admitted on 08/21/2019 and taken to the operating room where the patient underwent PLIF L3-L4. The patient tolerated the procedure well and was taken to the recovery room and then to the floor in stable condition. The hospital course was routine. There were no complications. The wound remained clean dry and intact. Pt had appropriate back soreness. No complaints of leg pain or new N/T/W. The patient remained afebrile with stable vital signs, and tolerated a regular diet. The patient continued to increase activities, and pain was well controlled with oral pain medications.   Consults: None  Significant Diagnostic Studies:  Results for orders placed or performed during the hospital encounter of 08/21/19  Glucose, capillary  Result Value Ref Range   Glucose-Capillary 121 (H) 70 - 99 mg/dL  Glucose, capillary  Result Value Ref Range   Glucose-Capillary 127 (H) 70 - 99 mg/dL   Comment 1 Notify RN    Comment 2 Document in Chart   Glucose, capillary  Result Value Ref Range   Glucose-Capillary 133 (H) 70 - 99 mg/dL   Comment 1 Notify RN    Comment 2 Document in Chart   Glucose, capillary  Result Value Ref Range   Glucose-Capillary 159 (H) 70 - 99 mg/dL   Comment 1 Notify RN    Comment 2 Document in Chart   Glucose, capillary  Result Value Ref Range   Glucose-Capillary 94 70 - 99 mg/dL  Glucose, capillary  Result Value Ref Range   Glucose-Capillary 144 (H) 70 - 99 mg/dL  Glucose, capillary  Result Value Ref Range   Glucose-Capillary 187 (H) 70 - 99 mg/dL   Comment 1 Notify RN    Comment 2 Document in Chart   Glucose, capillary  Result Value Ref Range    Glucose-Capillary 151 (H) 70 - 99 mg/dL   Comment 1 Notify RN    Comment 2 Document in Chart     Dg Lumbar Spine 2-3 Views  Result Date: 08/21/2019 CLINICAL DATA:  L3-4 PLIF with hardware removal EXAM: LUMBAR SPINE - 2-3 VIEW; DG C-ARM 1-60 MIN COMPARISON:  MRI 06/10/2019, CT 03/29/2017 FINDINGS: Two low resolution intraoperative spot views of the lumbar spine. Total fluoroscopy time was 38 seconds. Previously noted posterior rods and screws at L5 and S1 are removed. Interbody devices remain at L4-L5 and L5-S1. Fixating screws present at L3-L4 with interbody device. Sponge material on AP view at the lower lumbar spine. IMPRESSION: Intraoperative fluoroscopic assistance provided during lumbar spine surgery Electronically Signed   By: Donavan Foil M.D.   On: 08/21/2019 16:05   Mr Brain Wo Contrast  Result Date: 08/05/2019  [facility] [address], [place], [place] [address] [phone] NEUROIMAGING REPORT STUDY DATE: 08/05/2019 PATIENT NAME: IMANII GOSDIN DOB: 13-Jan-1951 MRN: [id number] ORDERING CLINICIAN: Dr Leta Baptist CLINICAL HISTORY:  56 year patient with memory loss COMPARISON FILMS: CT Head 04/28/2019 EXAM: MRI Brain wo TECHNIQUE:MRI of the brain without contrast was obtained utilizing 5 mm axial slices with T1, T2, T2 flair, T2 star gradient echo and diffusion weighted views.  T1 sagittal and T2 coronal views were obtained. CONTRAST: none IMAGING SITE: [facility] FINDINGS: The brain parenchyma shows mild periventricular changes of chronic microvascular ischemia.  There is mild supratentorial cortical atrophy which is slightly age disproportionate.  No structural lesion, tumor or infarcts are noted.No abnormal lesions are seen on diffusion-weighted views to suggest acute ischemia. The cortical sulci, fissures and cisterns are normal in size and appearance. Lateral, third and fourth ventricle are normal in size and appearance. No extra-axial fluid collections are  seen. No evidence of mass effect or midline shift.  On sagittal views the posterior fossa, pituitary gland and corpus callosum are unremarkable. No evidence of intracranial hemorrhage on gradient-echo views. The orbits and their contents, paranasal sinuses and calvarium are unremarkable.  Intracranial flow voids are present.   Slightly abnormal MRI scan of the brain showing mild supratentorial cortical atrophy which is slightly age disproportionate. INTERPRETING PHYSICIAN: Antony Contras, MD Certified in  Neuroimaging by [employer] and [employer]  Dg C-arm 1-60 Min  Result Date: 08/21/2019 CLINICAL DATA:  L3-4 PLIF with hardware removal EXAM: LUMBAR SPINE - 2-3 VIEW; DG C-ARM 1-60 MIN COMPARISON:  MRI 06/10/2019, CT 03/29/2017 FINDINGS: Two low resolution intraoperative spot views of the lumbar spine. Total fluoroscopy time was 38 seconds. Previously noted posterior rods and screws at L5 and S1 are removed. Interbody devices remain at L4-L5 and L5-S1. Fixating screws present at L3-L4 with interbody device. Sponge material on AP view at the lower lumbar spine. IMPRESSION: Intraoperative fluoroscopic assistance provided during lumbar spine surgery Electronically Signed   By: Donavan Foil M.D.   On: 08/21/2019 16:05    Antibiotics:  Anti-infectives (From admission, onward)   Start     Dose/Rate Route Frequency Ordered Stop   08/21/19 2100  ceFAZolin (ANCEF) IVPB 2g/100 mL premix     2 g 200 mL/hr over 30 Minutes Intravenous Every 8 hours 08/21/19 1814 08/22/19 0428   08/21/19 1336  bacitracin 50,000 Units in sodium chloride 0.9 % 500 mL irrigation  Status:  Discontinued       As needed 08/21/19 1336 08/21/19 1600   08/21/19 1045  ceFAZolin (ANCEF) IVPB 2g/100 mL premix     2 g 200 mL/hr over 30 Minutes Intravenous On call to O.R. 08/21/19 1044 08/21/19 1256      Discharge Exam: Blood pressure (!) 147/73, pulse 89, temperature 98.2 F  (36.8 C), temperature source Oral, resp. rate 19, height 5' 4"  (1.626 m), weight 84.1 kg, SpO2 95 %. Neurologic: Grossly normal Ambulating and voiding well, incision cdi  Discharge Medications:   Allergies as of 08/23/2019      Reactions   Carvedilol Cough   Cymbalta [duloxetine Hcl] Other (See Comments)   Caused hair loss   Fish Allergy Itching   Halibut fish   Morphine And Related Itching   Rosuvastatin Calcium Other (See Comments), Cough   Rapid heart rate   Almond Meal Itching, Swelling   Tongue swells and itches   Almond Oil Itching, Swelling   Itching and swelling of tongue   Losartan Potassium Cough      Medication List    TAKE these medications   Accu-Chek Guide test strip Generic drug: glucose blood TEST BID PRN   Accu-Chek Guide w/Device Kit See admin instructions.   albuterol (2.5 MG/3ML) 0.083% nebulizer solution Commonly known as: PROVENTIL Take 3 mLs (2.5 mg total) by nebulization every 4 (four) hours as needed for wheezing or shortness of breath.   ARIPiprazole 2 MG tablet Commonly known as: ABILIFY Take 1 tablet (2 mg total) by mouth daily.   aspirin EC 81 MG tablet Take  81 mg by mouth daily.   azelastine 0.1 % nasal spray Commonly known as: ASTELIN Place 1 spray into both nostrils 2 (two) times daily.   budesonide-formoterol 160-4.5 MCG/ACT inhaler Commonly known as: SYMBICORT INHALE 2 PUFFS INTO THE LUNGS TWICE DAILY   busPIRone 15 MG tablet Commonly known as: BUSPAR Take 1.5 tablets (22.5 mg total) by mouth 2 (two) times daily.   Clobetasol Propionate E 0.05 % emollient cream Generic drug: Clobetasol Prop Emollient Base Apply 1 application topically 2 (two) times daily as needed (rash).   desonide 0.05 % ointment Commonly known as: DESOWEN Apply 1 application topically 2 (two) times daily as needed.   diltiazem 180 MG 24 hr capsule Commonly known as: CARDIZEM CD Take 180 mg by mouth daily.   EPINEPHrine 0.3 mg/0.3 mL Soaj  injection Commonly known as: EpiPen 2-Pak Use as directed for severe allergic reaction What changed:   how much to take  how to take this  when to take this  reasons to take this  additional instructions   famotidine 20 MG tablet Commonly known as: Pepcid One after supper What changed:   how much to take  how to take this  when to take this  additional instructions   fexofenadine 180 MG tablet Commonly known as: ALLEGRA Take 180 mg by mouth daily.   fluticasone 50 MCG/ACT nasal spray Commonly known as: FLONASE Use 1-2 sprays in each nostril in each nostril once daily What changed:   how much to take  how to take this  when to take this  additional instructions   hydrochlorothiazide 25 MG tablet Commonly known as: HYDRODIURIL Take 25 mg by mouth daily.   HYDROcodone-acetaminophen 5-325 MG tablet Commonly known as: NORCO/VICODIN Take 1 tablet by mouth every 4 (four) hours as needed for moderate pain.   hydrOXYzine 25 MG tablet Commonly known as: ATARAX/VISTARIL TAKE 1 TABLET(25 MG) BY MOUTH EVERY 6 HOURS AS NEEDED What changed: See the new instructions.   metFORMIN 500 MG 24 hr tablet Commonly known as: GLUCOPHAGE-XR Take 500 mg by mouth 2 (two) times daily with a meal.   mirtazapine 30 MG tablet Commonly known as: REMERON TAKE 1 TABLET(30 MG) BY MOUTH AT BEDTIME What changed: See the new instructions.   montelukast 10 MG tablet Commonly known as: SINGULAIR Take 1 tablet (10 mg total) by mouth at bedtime.   Nebulizer [place] Use as directed with nebulizer solution.   Nebulizer/Tubing/Mouthpiece Kit Use as directed with nebulizer machine   Nucala 100 MG/ML Soaj Generic drug: Mepolizumab Inject 100 mg into the skin every 30 (thirty) days.   Oxcarbazepine 300 MG tablet Commonly known as: TRILEPTAL Take 2 tablets (600 mg total) by mouth at bedtime.   oxybutynin 10 MG 24 hr tablet Commonly known as: DITROPAN-XL Take 10 mg by mouth daily.    oxyCODONE-acetaminophen 10-325 MG tablet Commonly known as: PERCOCET Take 1 tablet by mouth every 8 (eight) hours as needed for pain.   Ozempic (0.25 or 0.5 MG/DOSE) 2 MG/1.5ML Sopn Generic drug: Semaglutide(0.25 or 0.5MG/DOS) Inject 0.5 mg into the skin every 7 (seven) days.   pantoprazole 40 MG tablet Commonly known as: Protonix Take 1 tablet (40 mg total) by mouth daily. Take 30-60 min before first meal of the day   Pazeo 0.7 % Soln Generic drug: Olopatadine HCl Apply 1 drop to eye daily as needed. What changed: reasons to take this   potassium chloride SA 20 MEQ tablet Commonly known as: K-DUR Take 20 mEq by  mouth daily.   pravastatin 20 MG tablet Commonly known as: PRAVACHOL Take 20 mg by mouth at bedtime.   pregabalin 100 MG capsule Commonly known as: LYRICA Take 100 mg by mouth 3 (three) times daily.   tiZANidine 2 MG tablet Commonly known as: ZANAFLEX Take 1 tablet (2 mg total) by mouth every 6 (six) hours as needed for muscle spasms.   zolpidem 10 MG tablet Commonly known as: AMBIEN Take 1/2-1 tab What changed:   how much to take  how to take this  when to take this  reasons to take this  additional instructions       Disposition: home   Final Dx:  PLIF L3-L4  Discharge Instructions    Call MD for:  difficulty breathing, headache or visual disturbances   Complete by: As directed    Call MD for:  hives   Complete by: As directed    Call MD for:  persistant dizziness or light-headedness   Complete by: As directed    Call MD for:  persistant nausea and vomiting   Complete by: As directed    Call MD for:  redness, tenderness, or signs of infection (pain, swelling, redness, odor or green/yellow discharge around incision site)   Complete by: As directed    Call MD for:  severe uncontrolled pain   Complete by: As directed    Call MD for:  temperature >100.4   Complete by: As directed    Diet - low sodium heart healthy   Complete by: As  directed    Driving Restrictions   Complete by: As directed    No driving for 2 weeks, no riding in the car for 1 week   Increase activity slowly   Complete by: As directed    Remove dressing in 48 hours   Complete by: As directed          Signed: Ocie Cornfield [name] [name] 08/23/2019, 10:27 AM

## 2019-08-27 ENCOUNTER — Other Ambulatory Visit: Payer: Self-pay | Admitting: Family Medicine

## 2019-09-04 ENCOUNTER — Encounter: Payer: Self-pay | Admitting: Allergy

## 2019-09-04 ENCOUNTER — Ambulatory Visit (INDEPENDENT_AMBULATORY_CARE_PROVIDER_SITE_OTHER): Payer: 59 | Admitting: Allergy

## 2019-09-04 ENCOUNTER — Other Ambulatory Visit: Payer: Self-pay

## 2019-09-04 DIAGNOSIS — H1013 Acute atopic conjunctivitis, bilateral: Secondary | ICD-10-CM

## 2019-09-04 DIAGNOSIS — L2089 Other atopic dermatitis: Secondary | ICD-10-CM

## 2019-09-04 DIAGNOSIS — J4540 Moderate persistent asthma, uncomplicated: Secondary | ICD-10-CM

## 2019-09-04 DIAGNOSIS — J3089 Other allergic rhinitis: Secondary | ICD-10-CM

## 2019-09-04 DIAGNOSIS — Z91018 Allergy to other foods: Secondary | ICD-10-CM

## 2019-09-04 MED ORDER — FLUTICASONE PROPIONATE 50 MCG/ACT NA SUSP: 2.0000 | Freq: Every day | NASAL | 1 refills | Status: DC

## 2019-09-04 MED ORDER — MONTELUKAST SODIUM 10 MG PO TABS: 10.0000 mg | ORAL_TABLET | Freq: Every day | ORAL | 1 refills | Status: DC

## 2019-09-04 MED ORDER — FEXOFENADINE HCL 180 MG PO TABS: 180.0000 mg | ORAL_TABLET | Freq: Every day | ORAL | 1 refills | Status: DC

## 2019-09-04 MED ORDER — AZELASTINE HCL 0.1 % NA SOLN: 1.0000 | Freq: Two times a day (BID) | NASAL | 1 refills | Status: DC

## 2019-09-04 MED ORDER — FAMOTIDINE 20 MG PO TABS: 20.0000 mg | ORAL_TABLET | Freq: Every day | ORAL | 1 refills | Status: DC

## 2019-09-04 MED ORDER — EPINEPHRINE 0.3 MG/0.3ML IJ SOAJ: 0.3000 mg | INTRAMUSCULAR | 2 refills | Status: DC | PRN

## 2019-09-04 MED ORDER — ALBUTEROL SULFATE HFA 108 (90 BASE) MCG/ACT IN AERS: 2.0000 | INHALATION_SPRAY | RESPIRATORY_TRACT | 1 refills | Status: DC | PRN

## 2019-09-04 MED ORDER — BUDESONIDE-FORMOTEROL FUMARATE 160-4.5 MCG/ACT IN AERO: 2.0000 | INHALATION_SPRAY | Freq: Two times a day (BID) | RESPIRATORY_TRACT | 1 refills | Status: DC

## 2019-09-04 MED ORDER — PAZEO 0.7 % OP SOLN: 1.0000 [drp] | Freq: Every day | OPHTHALMIC | 1 refills | Status: DC

## 2019-09-04 NOTE — Patient Instructions (Addendum)
    Allergic rhinitis with conjunctivitis - Continue Flonase 2 sprays daily as needed.  Use flonase for 1-2 weeks at a time before stopping once symptoms improve. - Continue Astelin nasal spray 2 sprays in each nostril twice a day as needed for nasal drainage/post-nasal drip - Continue saline nasal rinses once a day for nasal symptoms. Use before nasal sprays - Continue Allegra 180 mg daily use (may take additional dose if needed to control allergy symptoms) - Continue Singulair 10 mg at bedtime - Continue Pazeo eyedrop 1 drop in each as needed for watery, itchy, red eyes - Continue allergen avoidance measures   Moderate persistent asthma -much better control - Continue Symbicort 160 - 2 puffs twice daily - Continue singulair as above  - have access to albuterol inhaler 2 puffs every 4-6 hours as needed for cough/wheeze/shortness of breath/chest tightness.  May use 15-20 minutes prior to activity.   Monitor frequency of use.   -Continue Nucala monthly autoinjector to allow for ease of home administration.  Daugther is giving her injections once a month.    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 - Carry EpiPen at all times.   Flexural atopic dermatitis - Continue moisturizing routine with Aveeno - Use triamcinolone 0.1% below the face.     Follow up in 3-4  or sooner if needed.

## 2019-09-04 NOTE — Progress Notes (Signed)
RE: Chelsea Benson MRN: [id number] DOB: 08/07/1951 Date of Telemedicine Visit: 09/04/2019  Referring provider: Kathyrn Lass, MD Primary care provider: Kathyrn Lass, MD  Chief Complaint: Allergic Rhinitis  and Asthma   Telemedicine Follow Up Visit via Telephone: I connected with Chelsea Benson for a follow up on 09/04/19 by telephone and verified that I am speaking with the correct person using two identifiers.   I discussed the limitations, risks, security and privacy concerns of performing an evaluation and management service by telephone and the availability of in person appointments. I also discussed with the patient that there may be a patient responsible charge related to this service. The patient expressed understanding and agreed to proceed.  Patient is at home.  Provider is at the office.  Visit start time: 1884 Visit end time: Solomon consent/check in by: Chelsea Benson Medical consent and medical assistant/nurse: Chelsea Benson  History of Present Illness: She is a 68 y.o. female, who is being followed for allergic rhinitis with conjunctivitis, asthma, atopic dermatitis and food allergy. Her previous allergy office visit was on 05/29/2019 via telemedicine with Dr. Nelva Bush.   She states she has been doing well since her last visit has recovered from the sinusitis with conjunctivitis that she had at the last visit.  She was treated with Augmentin.  She states that her asthma is doing much better now that she is doing monthly Nucala injections at home.  Her daughter is providing her with the injections once a month.  She states she has not needed to use her rescue inhaler since she has been switched over to [place].  She states that her pulmonologist stopped her Spiriva and she is just on Symbicort 160 mcg 2 puffs twice a day at this time.  She denies any nighttime awakenings.  She has not had any ED or urgent care visits for asthma since her last visit. She states that her allergy  symptoms have also been well controlled with the use of Singulair, Allegra, Flonase and Astelin.  She also has Pazeo to use for her allergy ocular symptoms. She states that she has not had any flares of her skin and is moisturizing daily.  She does have access to triamcinolone for as needed use. She continues to avoid almonds and has not had any accidental ingestions or need to use her epinephrine device.  Assessment and Plan: Chelsea Benson is a 68 y.o. female with:      Allergic rhinitis with conjunctivitis - Continue Flonase 2 sprays daily as needed.  Use flonase for 1-2 weeks at a time before stopping once symptoms improve. - Continue Astelin nasal spray 2 sprays in each nostril twice a day as needed for nasal drainage/post-nasal drip - Continue saline nasal rinses once a day for nasal symptoms. Use before nasal sprays - Continue Allegra 180 mg daily use (may take additional dose if needed to control allergy symptoms) - Continue Singulair 10 mg at bedtime - Continue Pazeo eyedrop 1 drop in each as needed for watery, itchy, red eyes - Continue allergen avoidance measures   Moderate persistent asthma -much better control - Continue Symbicort 160 - 2 puffs twice daily - Continue singulair as above  - have access to albuterol inhaler 2 puffs every 4-6 hours as needed for cough/wheeze/shortness of breath/chest tightness.  May use 15-20 minutes prior to activity.   Monitor frequency of use.   -Continue Nucala monthly autoinjector to allow for ease of home administration.  Daugther is giving her injections once  a month.    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 - Carry EpiPen at all times.   Flexural atopic dermatitis - Continue moisturizing routine with Aveeno - Use triamcinolone 0.1% below the face.   Follow up in 3-4  or sooner if needed.    Diagnostics: None.  Medication List:  Current Outpatient Medications  Medication Sig Dispense Refill  . ACCU-CHEK GUIDE test strip TEST  BID PRN  1  . albuterol (PROVENTIL) (2.5 MG/3ML) 0.083% nebulizer solution Take 3 mLs (2.5 mg total) by nebulization every 4 (four) hours as needed for wheezing or shortness of breath. 375 mL 1  . ARIPiprazole (ABILIFY) 2 MG tablet Take 1 tablet (2 mg total) by mouth daily. 90 tablet 1  . aspirin EC 81 MG tablet Take 81 mg by mouth daily.    Marland Kitchen azelastine (ASTELIN) 0.1 % nasal spray Place 1 spray into both nostrils 2 (two) times daily. 30 mL 4  . Blood Glucose Monitoring Suppl (ACCU-CHEK GUIDE) w/Device KIT See admin instructions.  1  . budesonide-formoterol (SYMBICORT) 160-4.5 MCG/ACT inhaler INHALE 2 PUFFS INTO THE LUNGS TWICE DAILY (Patient taking differently: Inhale 2 puffs into the lungs 2 (two) times daily. ) 30.6 g 1  . busPIRone (BUSPAR) 15 MG tablet Take 1.5 tablets (22.5 mg total) by mouth 2 (two) times daily. 270 tablet 0  . CLOBETASOL PROPIONATE E 0.05 % emollient cream Apply 1 application topically 2 (two) times daily as needed (rash).     Marland Kitchen desonide (DESOWEN) 0.05 % ointment Apply 1 application topically 2 (two) times daily as needed. 60 g 5  . diltiazem (CARDIZEM CD) 180 MG 24 hr capsule Take 180 mg by mouth daily.     Marland Kitchen EPINEPHrine (EPIPEN 2-PAK) 0.3 mg/0.3 mL IJ SOAJ injection Use as directed for severe allergic reaction (Patient taking differently: Inject 0.3 mg into the muscle once as needed for anaphylaxis (severe allergic reaction). ) 2 Device 1  . famotidine (PEPCID) 20 MG tablet One after supper (Patient taking differently: Take 20 mg by mouth daily after supper. ) 30 tablet 11  . fexofenadine (ALLEGRA) 180 MG tablet Take 180 mg by mouth daily.    . fluticasone (FLONASE) 50 MCG/ACT nasal spray Use 1-2 sprays in each nostril in each nostril once daily (Patient taking differently: Place 2 sprays into both nostrils 2 (two) times daily. ) 48 g 1  . hydrochlorothiazide (HYDRODIURIL) 25 MG tablet Take 25 mg by mouth daily.     Marland Kitchen HYDROcodone-acetaminophen (NORCO/VICODIN) 5-325 MG  tablet Take 1 tablet by mouth every 4 (four) hours as needed for moderate pain. 20 tablet 0  . hydrOXYzine (ATARAX/VISTARIL) 25 MG tablet TAKE 1 TABLET(25 MG) BY MOUTH EVERY 6 HOURS AS NEEDED (Patient taking differently: Take 25 mg by mouth every 6 (six) hours as needed for anxiety. ) 270 tablet 1  . Mepolizumab (NUCALA) 100 MG/ML SOAJ Inject 100 mg into the skin every 30 (thirty) days.    . metFORMIN (GLUCOPHAGE-XR) 500 MG 24 hr tablet Take 500 mg by mouth 2 (two) times daily with a meal.     . mirtazapine (REMERON) 30 MG tablet TAKE 1 TABLET(30 MG) BY MOUTH AT BEDTIME (Patient taking differently: Take 30 mg by mouth at bedtime. ) 90 tablet 1  . montelukast (SINGULAIR) 10 MG tablet Take 1 tablet (10 mg total) by mouth at bedtime. 90 tablet 0  . Oxcarbazepine (TRILEPTAL) 300 MG tablet Take 2 tablets (600 mg total) by mouth at bedtime. 90 tablet  1  . oxybutynin (DITROPAN-XL) 10 MG 24 hr tablet Take 10 mg by mouth daily.     Marland Kitchen oxyCODONE-acetaminophen (PERCOCET) 10-325 MG tablet Take 1 tablet by mouth every 8 (eight) hours as needed for pain.     Marland Kitchen OZEMPIC, 0.25 OR 0.5 MG/DOSE, 2 MG/1.5ML SOPN Inject 0.5 mg into the skin every 7 (seven) days.     . pantoprazole (PROTONIX) 40 MG tablet Take 1 tablet (40 mg total) by mouth daily. Take 30-60 min before first meal of the day 30 tablet 2  . PAZEO 0.7 % SOLN INSTILL 1 DROP IN BOTH EYES DAILY AS NEEDED 2.5 mL 0  . potassium chloride SA (K-DUR) 20 MEQ tablet Take 20 mEq by mouth daily.    . pravastatin (PRAVACHOL) 20 MG tablet Take 20 mg by mouth at bedtime.     . pregabalin (LYRICA) 100 MG capsule Take 100 mg by mouth 3 (three) times daily.    Marland Kitchen Respiratory Therapy Supplies (NEBULIZER) DEVI Use as directed with nebulizer solution. 1 each 1  . Respiratory Therapy Supplies (NEBULIZER/TUBING/MOUTHPIECE) KIT Use as directed with nebulizer machine 1 kit 12  . tiZANidine (ZANAFLEX) 2 MG tablet Take 1 tablet (2 mg total) by mouth every 6 (six) hours as needed for  muscle spasms. 30 tablet 0  . zolpidem (AMBIEN) 10 MG tablet Take 1/2-1 tab (Patient taking differently: Take 5-10 mg by mouth at bedtime as needed for sleep. ) 90 tablet 1  . doxycycline (VIBRA-TABS) 100 MG tablet     . methylPREDNISolone (MEDROL DOSEPAK) 4 MG TBPK tablet     . mupirocin ointment (BACTROBAN) 2 % APPLY NASALLY 2 TIMES DAILY FOR 5 DAYS PRIOR TO SURGERY     No current facility-administered medications for this visit.    Allergies: Allergies  Allergen Reactions  . Carvedilol Cough  . Cymbalta [Duloxetine Hcl] Other (See Comments)    Caused hair loss  . Fish Allergy Itching    Halibut fish  . Morphine And Related Itching  . Rosuvastatin Calcium Other (See Comments) and Cough    Rapid heart rate  . Almond Meal Itching and Swelling    Tongue swells and itches  . Almond Oil Itching and Swelling    Itching and swelling of tongue   . Losartan Potassium Cough   I reviewed her past medical history, social history, family history, and environmental history and no significant changes have been reported from previous visit on 05/29/2019.  Review of Systems  Constitutional: Negative for chills and fever.  HENT: Negative for congestion, ear pain, nosebleeds, postnasal drip, rhinorrhea, sinus pressure, sinus pain, sneezing and sore throat.   Eyes: Negative for pain, discharge and itching.  Respiratory: Negative for cough, chest tightness, shortness of breath and wheezing.   Cardiovascular: Negative.   Gastrointestinal: Negative.   Musculoskeletal: Negative.   Skin: Negative.   Neurological: Negative.    Objective: Physical Exam Not obtained as encounter was done via telephone.   Previous notes and tests were reviewed.  I discussed the assessment and treatment plan with the patient. The patient was provided an opportunity to ask questions and all were answered. The patient agreed with the plan and demonstrated an understanding of the instructions.   The patient was  advised to call back or seek an in-person evaluation if the symptoms worsen or if the condition fails to improve as anticipated.  I provided 26 minutes of non-face-to-face time during this encounter.  It was my pleasure to participate in [place]  care today. Please feel free to contact me with any questions or concerns.   Sincerely,  Geordan Xu Charmian Muff, MD

## 2019-09-06 ENCOUNTER — Other Ambulatory Visit: Payer: Self-pay | Admitting: Internal Medicine

## 2019-09-06 MED ORDER — FAMOTIDINE 20 MG PO TABS: 20.0000 mg | ORAL_TABLET | Freq: Every day | ORAL | 0 refills | Status: DC

## 2019-09-06 MED ORDER — PANTOPRAZOLE SODIUM 40 MG PO TBEC: 40.0000 mg | DELAYED_RELEASE_TABLET | Freq: Every day | ORAL | 0 refills | Status: DC

## 2019-09-08 ENCOUNTER — Other Ambulatory Visit: Payer: Self-pay

## 2019-09-08 DIAGNOSIS — F5101 Primary insomnia: Secondary | ICD-10-CM

## 2019-09-08 DIAGNOSIS — F3181 Bipolar II disorder: Secondary | ICD-10-CM

## 2019-09-08 DIAGNOSIS — F419 Anxiety disorder, unspecified: Secondary | ICD-10-CM

## 2019-09-08 MED ORDER — HYDROXYZINE HCL 25 MG PO TABS: ORAL_TABLET | ORAL | 1 refills | Status: DC

## 2019-09-08 MED ORDER — BUSPIRONE HCL 15 MG PO TABS: 22.5000 mg | ORAL_TABLET | Freq: Two times a day (BID) | ORAL | 0 refills | Status: DC

## 2019-09-08 MED ORDER — OXCARBAZEPINE 300 MG PO TABS: 600.0000 mg | ORAL_TABLET | Freq: Every day | ORAL | 1 refills | Status: DC

## 2019-09-08 MED ORDER — ARIPIPRAZOLE 2 MG PO TABS: 2.0000 mg | ORAL_TABLET | Freq: Every day | ORAL | 1 refills | Status: DC

## 2019-09-08 MED ORDER — MIRTAZAPINE 30 MG PO TABS: ORAL_TABLET | ORAL | 1 refills | Status: DC

## 2019-09-09 ENCOUNTER — Ambulatory Visit (INDEPENDENT_AMBULATORY_CARE_PROVIDER_SITE_OTHER): Payer: 59 | Admitting: Psychiatry

## 2019-09-09 ENCOUNTER — Other Ambulatory Visit: Payer: Self-pay

## 2019-09-09 ENCOUNTER — Other Ambulatory Visit: Payer: Self-pay | Admitting: *Deleted

## 2019-09-09 DIAGNOSIS — Z91018 Allergy to other foods: Secondary | ICD-10-CM

## 2019-09-09 DIAGNOSIS — F3181 Bipolar II disorder: Secondary | ICD-10-CM

## 2019-09-09 DIAGNOSIS — J4540 Moderate persistent asthma, uncomplicated: Secondary | ICD-10-CM

## 2019-09-09 MED ORDER — PAZEO 0.7 % OP SOLN: 1.0000 [drp] | Freq: Every day | OPHTHALMIC | 1 refills | Status: DC

## 2019-09-09 MED ORDER — AZELASTINE HCL 0.1 % NA SOLN: 1.0000 | Freq: Two times a day (BID) | NASAL | 1 refills | Status: DC

## 2019-09-09 MED ORDER — EPINEPHRINE 0.3 MG/0.3ML IJ SOAJ: 0.3000 mg | INTRAMUSCULAR | 2 refills | Status: DC | PRN

## 2019-09-09 MED ORDER — FLUTICASONE PROPIONATE 50 MCG/ACT NA SUSP: 2.0000 | Freq: Every day | NASAL | 1 refills | Status: DC

## 2019-09-09 MED ORDER — MONTELUKAST SODIUM 10 MG PO TABS: 10.0000 mg | ORAL_TABLET | Freq: Every day | ORAL | 1 refills | Status: DC

## 2019-09-09 MED ORDER — DESONIDE 0.05 % EX OINT: 1.0000 "application " | TOPICAL_OINTMENT | Freq: Two times a day (BID) | CUTANEOUS | 1 refills | Status: DC | PRN

## 2019-09-09 MED ORDER — ZOLPIDEM TARTRATE 10 MG PO TABS: 5.0000 mg | ORAL_TABLET | Freq: Every evening | ORAL | 0 refills | Status: DC | PRN

## 2019-09-09 MED ORDER — BUDESONIDE-FORMOTEROL FUMARATE 160-4.5 MCG/ACT IN AERO: 2.0000 | INHALATION_SPRAY | Freq: Two times a day (BID) | RESPIRATORY_TRACT | 1 refills | Status: DC

## 2019-09-09 NOTE — Progress Notes (Signed)
[facility] Counselor/Therapist Progress Note  Patient ID: Chelsea Benson, MRN: [id number],    Date: 09/09/2019  Time Spent: 50 minutes     8:50am to 9:40am  Virtual Visit with Video Note Connected with patient by a video enabled telemedicine/telehealth application or telephone, with their informed consent, and verified patient privacy and that I am speaking with the correct person using two identifiers. I discussed the limitations, risks, security and privacy concerns of performing psychotherapy and management service by telephone and the availability of in person appointments. I also discussed with the patient that there may be a patient responsible charge related to this service. The patient expressed understanding and agreed to proceed. I discussed the treatment planning with the patient. The patient was provided an opportunity to ask questions and all were answered. The patient agreed with the plan and demonstrated an understanding of the instructions. The patient was advised to call  our office if  symptoms worsen or feel they are in a crisis state and need immediate contact.   Therapist Location: [facility] Patient Location: home   Treatment Type: Individual Therapy  Reported Symptoms: anxiety, frustration  Mental Status Exam:  Appearance:   Casual     Behavior:  Sharing  Motor:  Normal  Speech/Language:   Normal Rate  Affect:  anxious  Mood:  anxious  Thought process:  goal directed  Thought content:    WNL  Sensory/Perceptual disturbances:    WNL  Orientation:  oriented to person, place, time/date, situation, day of week, month of year and year  Attention:  Good  Concentration:  Good  Memory:  WNL  Fund of knowledge:   Good  Insight:    Good  Judgment:   Good  Impulse Control:  Good   Risk Assessment: Danger to Self:  No Self-injurious Behavior: No Danger to Others: No Duty to Warn:no Physical Aggression / Violence:No  Access to Firearms a  concern: No  Gang Involvement:No   Subjective:  Patient reports she is recovering from her recent back procedure.  Anxious, some frustration.  Feels she is being careful moving around, is taking meds as prescribed and is healing gradually.  "I already feel better with my back." Has people checking on her at home. Death of a long time friend and death of an uncle since her last appt, and we processed this some today.  Concerned "about the virus, trying to be safe and not get it, been going on too long."  Frustrated with ex-husband who has been in town recently and in contact with patient. Sleeping had "been off" right after back procedure but is sleeping better and on more of her regular sleep schedule now.  Appetite comes and goes and seems to better at breakfast, then a late lunch and later dinner.  Interventions: Cognitive Behavioral Therapy and Ego-Supportive  Diagnosis:   ICD-10-CM   1. Bipolar II disorder (HCC)  F31.81      Plan:  Patient not signing tx goals on computer screen due to COVID.  Treatment Goals:  Goals remain on tx plan as patient works on strategies to meet her goals.  Progress will be documented each visit in "Progress" section on Plan.    Long term goal: Reduce overall level, frequency, and intensity of the anxiety so that daily functioning is not impaired.   Short term goal: Increase understanding of beliefs and messages that produce the worry and anxiety.  Strategy: Patient will explore and work to stop cognitive  messages that feed anxiety and work to replace them with more positive and empowering messages.   PROGRESS: Patient showing strength and progress as she worked today on better understanding the cognitive messages that are more anxiety-provoking or lead "her to worry more".  Gave several examples in session and she is to be monitoring her thoughts more and try to interrupt the anxious thoughts and then replace them with more reality-based, positive  thought patterns. May be a lot for patient right now so I encouraged to focus more on the monitoring and interrupting of the problematic thoughts. Used part of our time to allow her to process grief re: death of an uncle and a close friend since out last appt. With her back procedure and these 2 deaths, she felt she had not worked as much on her therapy goals as she would have under more "normal circumstances".  Next appt within 2 weeks.   Shanon Ace, LCSW

## 2019-09-23 ENCOUNTER — Telehealth: Payer: Self-pay

## 2019-09-23 NOTE — Telephone Encounter (Signed)
Patient called C/o of sinus problems for several day, she said it started yesterday but then reiterated it started 3 days ago. Patient is currently taking all her current regimen medication as directed and states she doesn't have any flu like symptoms and no fever. She reports temperature of 97.6 F and is wanting to know if Dr. Nelva Bush had any suggestion for her symptoms. Patient would like any Rx sent to [facility] if possible for her symptoms. Please advise

## 2019-09-24 ENCOUNTER — Ambulatory Visit (INDEPENDENT_AMBULATORY_CARE_PROVIDER_SITE_OTHER): Payer: 59 | Admitting: Psychiatry

## 2019-09-24 ENCOUNTER — Encounter: Payer: Self-pay | Admitting: Psychiatry

## 2019-09-24 ENCOUNTER — Other Ambulatory Visit: Payer: Self-pay

## 2019-09-24 DIAGNOSIS — F5101 Primary insomnia: Secondary | ICD-10-CM

## 2019-09-24 DIAGNOSIS — F419 Anxiety disorder, unspecified: Secondary | ICD-10-CM

## 2019-09-24 DIAGNOSIS — F3181 Bipolar II disorder: Secondary | ICD-10-CM

## 2019-09-24 MED ORDER — BUSPIRONE HCL 15 MG PO TABS: 22.5000 mg | ORAL_TABLET | Freq: Two times a day (BID) | ORAL | 0 refills | Status: DC

## 2019-09-24 NOTE — Telephone Encounter (Signed)
Ok thanks for the clarification.   Unfortunately antibiotics are not always the best treatment or necessary treatment given time frame of symptoms at this time are short in duration and most likely to be viral.   If she calls back will need to get what symptoms are and if worsening or extending past a week then she may warrant antibiotics at that time.

## 2019-09-24 NOTE — Telephone Encounter (Signed)
Patient states she is doing the regimen that was recommended by Dr. Nelva Bush and wants her to send in a abx. Nothing is working per patient statement. I advise that abx will not be warrant at this time per Dr. Nelva Bush request and advise to continue current regimen. Patient was not satisfy with this and hung up the phone. Before she hung up I was going to see if Dr. Nelva Bush had any other suggestions.

## 2019-09-24 NOTE — Telephone Encounter (Signed)
What symptoms is she having?   Increased nasal congestion, drainage, sinus pressure/pain, headache etc?   This will help tailor what will work best for her.    If symptoms have only been present for 3 days then it is most likely viral at this time and would not warrant antibiotics.  Will recommend any addition treatments once know what symptoms are.    She should be doing the following regimen: - Flonase 2 sprays daily for congestion - Astelin nasal spray 2 sprays in each nostril twice a day for nasal drainage/post-nasal drip - Saline nasal rinses once a day for nasal symptoms. Use before nasal sprays - Allegra 180 mg daily use (may take additional dose if needed to control allergy symptoms) - Singulair 10 mg at bedtime - Pazeo eyedrop 1 drop in each as needed for watery, itchy, red eyes - Symbicort 160 - 2 puffs twice daily - prn albuterol inhaler 2 puffs every 4-6 hours as needed for cough/wheeze/shortness of breath/chest tightness.    Please encourage her to receive her flu vaccine if not done so already.

## 2019-09-24 NOTE — Telephone Encounter (Signed)
Called patient and left voicemail asking to return call to discuss.  

## 2019-09-24 NOTE — Telephone Encounter (Signed)
Was she able to tell you what her symptoms are?  She may benefit from prednisone depending on what her symptoms are.   We can offer her a televisit if she would like to discuss her symptoms.

## 2019-09-24 NOTE — Telephone Encounter (Signed)
Patient called for an update.  Please Advise.

## 2019-09-24 NOTE — Telephone Encounter (Signed)
Noted. Thank You.

## 2019-09-24 NOTE — Telephone Encounter (Signed)
Yes witnessed that Chelsea Benson was asking what her symptoms were after stating that antibiotics would not be sent in. Chelsea Benson was unable to get an answers on what her symptoms were unfortunately.

## 2019-09-24 NOTE — Progress Notes (Signed)
Chelsea Benson [id number] 1951/09/16 68 y.o.  Virtual Visit via Telephone Note  I connected with pt on 09/24/19 at 11:00 AM EDT by telephone and verified that I am speaking with the correct person using two identifiers.   I discussed the limitations, risks, security and privacy concerns of performing an evaluation and management service by telephone and the availability of in person appointments. I also discussed with the patient that there may be a patient responsible charge related to this service. The patient expressed understanding and agreed to proceed.   I discussed the assessment and treatment plan with the patient. The patient was provided an opportunity to ask questions and all were answered. The patient agreed with the plan and demonstrated an understanding of the instructions.   The patient was advised to call back or seek an in-person evaluation if the symptoms worsen or if the condition fails to improve as anticipated.  I provided 15 minutes of non-face-to-face time during this encounter.  The patient was located at home.  The provider was located at [facility]   Chelsea Benson, PMHNP   Subjective:   Patient ID:  Chelsea Benson is a 68 y.o. (DOB [birth year]) female.  Chief Complaint:  Chief Complaint  Patient presents with  . Follow-up    Anxiety, Insomnia, and Depression    HPI Chelsea Benson presents for follow-up of mood, anxiety, and insomnia. She reports that her surgery went well and she was walking the day after surgery. She reports that daughter's father left. She reports that he helped her after her surgery. She reports that she is supposed to be returning and she is anxious about this. She reports that his presence and occasional behaviors "brings back a lot of memories and I really don't want to be going through that again." Denies depressed mood. She reports that she has been sleeping well. Her appetite has been stable. Had had intentional 14  lbs weight loss. Energy and motivation have been good and reports that she is trying to stay busy and preparing some foods for family. Denies SI.   Reports family has been supportive and visiting regularly.   Review of Systems:  Review of Systems  HENT: Positive for congestion, postnasal drip and sinus pain.   Musculoskeletal: Negative for gait problem.       Improved pain in legs. Continues to have some low back pain.   Allergic/Immunologic: Positive for environmental allergies.  Neurological: Positive for headaches. Negative for tremors.  Psychiatric/Behavioral:       Please refer to HPI    Medications: I have reviewed the patient's current medications.  Current Outpatient Medications  Medication Sig Dispense Refill  . ACCU-CHEK GUIDE test strip TEST BID PRN  1  . albuterol (PROVENTIL) (2.5 MG/3ML) 0.083% nebulizer solution Take 3 mLs (2.5 mg total) by nebulization every 4 (four) hours as needed for wheezing or shortness of breath. 375 mL 1  . albuterol (VENTOLIN HFA) 108 (90 Base) MCG/ACT inhaler Inhale 2 puffs into the lungs every 4 (four) hours as needed. 36 g 1  . ARIPiprazole (ABILIFY) 2 MG tablet Take 1 tablet (2 mg total) by mouth daily. 90 tablet 1  . aspirin EC 81 MG tablet Take 81 mg by mouth daily.    Marland Kitchen azelastine (ASTELIN) 0.1 % nasal spray Place 1 spray into both nostrils 2 (two) times daily. 90 mL 1  . Blood Glucose Monitoring Suppl (ACCU-CHEK GUIDE) w/Device KIT See admin instructions.  1  . budesonide-formoterol (SYMBICORT)  160-4.5 MCG/ACT inhaler Inhale 2 puffs into the lungs 2 (two) times daily. 30.6 g 1  . busPIRone (BUSPAR) 15 MG tablet Take 1.5 tablets (22.5 mg total) by mouth 2 (two) times daily. 270 tablet 0  . CLOBETASOL PROPIONATE E 0.05 % emollient cream Apply 1 application topically 2 (two) times daily as needed (rash).     Marland Kitchen desonide (DESOWEN) 0.05 % ointment Apply 1 application topically 2 (two) times daily as needed. 180 g 1  . diltiazem (CARDIZEM CD) 180  MG 24 hr capsule Take 180 mg by mouth daily.     Marland Kitchen doxycycline (VIBRA-TABS) 100 MG tablet     . EPINEPHrine (EPIPEN 2-PAK) 0.3 mg/0.3 mL IJ SOAJ injection Inject 0.3 mLs (0.3 mg total) into the muscle as needed. 2 each 2  . famotidine (PEPCID) 20 MG tablet Take 1 tablet (20 mg total) by mouth daily after supper. 90 tablet 0  . fexofenadine (ALLEGRA) 180 MG tablet Take 1 tablet (180 mg total) by mouth daily. 90 tablet 1  . fluticasone (FLONASE) 50 MCG/ACT nasal spray Place 2 sprays into both nostrils daily. 48 g 1  . hydrochlorothiazide (HYDRODIURIL) 25 MG tablet Take 25 mg by mouth daily.     Marland Kitchen HYDROcodone-acetaminophen (NORCO/VICODIN) 5-325 MG tablet Take 1 tablet by mouth every 4 (four) hours as needed for moderate pain. 20 tablet 0  . hydrOXYzine (ATARAX/VISTARIL) 25 MG tablet TAKE 1 TABLET(25 MG) BY MOUTH EVERY 6 HOURS AS NEEDED 270 tablet 1  . Mepolizumab (NUCALA) 100 MG/ML SOAJ Inject 100 mg into the skin every 30 (thirty) days.    . metFORMIN (GLUCOPHAGE-XR) 500 MG 24 hr tablet Take 500 mg by mouth 2 (two) times daily with a meal.     . methylPREDNISolone (MEDROL DOSEPAK) 4 MG TBPK tablet     . mirtazapine (REMERON) 30 MG tablet TAKE 1 TABLET(30 MG) BY MOUTH AT BEDTIME 90 tablet 1  . montelukast (SINGULAIR) 10 MG tablet Take 1 tablet (10 mg total) by mouth at bedtime. 90 tablet 1  . mupirocin ointment (BACTROBAN) 2 % APPLY NASALLY 2 TIMES DAILY FOR 5 DAYS PRIOR TO SURGERY    . Olopatadine HCl (PAZEO) 0.7 % SOLN Apply 1 drop to eye daily. 7.5 mL 1  . Oxcarbazepine (TRILEPTAL) 300 MG tablet Take 2 tablets (600 mg total) by mouth at bedtime. 180 tablet 1  . oxybutynin (DITROPAN-XL) 10 MG 24 hr tablet Take 10 mg by mouth daily.     Marland Kitchen oxyCODONE-acetaminophen (PERCOCET) 10-325 MG tablet Take 1 tablet by mouth every 8 (eight) hours as needed for pain.     Marland Kitchen OZEMPIC, 0.25 OR 0.5 MG/DOSE, 2 MG/1.5ML SOPN Inject 0.5 mg into the skin every 7 (seven) days.     . pantoprazole (PROTONIX) 40 MG tablet  Take 1 tablet (40 mg total) by mouth daily. Take 30-60 min before first meal of the day 90 tablet 0  . potassium chloride SA (K-DUR) 20 MEQ tablet Take 20 mEq by mouth daily.    . pravastatin (PRAVACHOL) 20 MG tablet Take 20 mg by mouth at bedtime.     . pregabalin (LYRICA) 100 MG capsule Take 100 mg by mouth 3 (three) times daily.    Marland Kitchen Respiratory Therapy Supplies (NEBULIZER) DEVI Use as directed with nebulizer solution. 1 each 1  . Respiratory Therapy Supplies (NEBULIZER/TUBING/MOUTHPIECE) KIT Use as directed with nebulizer machine 1 kit 12  . tiZANidine (ZANAFLEX) 2 MG tablet Take 1 tablet (2 mg total) by mouth every 6 (six)  hours as needed for muscle spasms. 30 tablet 0  . zolpidem (AMBIEN) 10 MG tablet Take 0.5-1 tablets (5-10 mg total) by mouth at bedtime as needed for sleep. 90 tablet 0   No current facility-administered medications for this visit.     Medication Side Effects: None  Allergies:  Allergies  Allergen Reactions  . Carvedilol Cough  . Cymbalta [Duloxetine Hcl] Other (See Comments)    Caused hair loss  . Fish Allergy Itching    Halibut fish  . Morphine And Related Itching  . Rosuvastatin Calcium Other (See Comments) and Cough    Rapid heart rate  . Almond Meal Itching and Swelling    Tongue swells and itches  . Almond Oil Itching and Swelling    Itching and swelling of tongue   . Losartan Potassium Cough    Past Medical History:  Diagnosis Date  . Allergic rhinoconjunctivitis   . Allergy history unknown   . Anxiety   . Arthritis    DDD, spondylosis  . Asthma   . Bipolar disorder ([place])   . Chronic back pain    Has had several surgeries.  He uses a walker  . Chronic kidney disease    renal calculi  . Depression   . Diabetes mellitus without complication ([place])    dx in 2007  . Diabetes mellitus, type II ([place])   . Eczema   . Headache(784.0)    sinus related   . History of kidney stones   . Hx of blood clots    lungs  . Hx of pulmonary embolus 2017   . Hyperlipidemia   . Hypertension   . Insomnia   . Knee pain   . Left ankle pain   . Memory change   . Mental disorder   . Mood disorder ([place])   . OSA on CPAP    PSG 05/24/2016: AHI 17/hour 02 mean 76%.  HSAT 06/13/17, AHI 11-hour 02 mean 79%  . Recurrent upper respiratory infection (URI)   . Sleep apnea   . Tremor of both hands     Family History  Problem Relation Age of Onset  . Diabetes Sister   . Diabetes Brother   . Hypertension Brother   . Cancer Maternal Grandmother   . Heart failure Maternal Grandmother   . Hypertension Paternal Grandmother   . Asthma Daughter   . Cancer Daughter   . Depression Daughter   . Hypertension Daughter   . Heart failure Maternal Aunt   . Pneumonia Mother   . Diabetes Mother   . Alcoholism Father   . Alcohol abuse Father   . Depression Daughter   . Schizophrenia Grandchild   . Allergies Neg Hx   . Eczema Neg Hx   . Immunodeficiency Neg Hx     Social History   Socioeconomic History  . Marital status: Single    Spouse name: Not on file  . Number of children: 2  . Years of education: Not on file  . Highest education level: Some college, no degree  Occupational History    Comment: NA  Social Needs  . Financial resource strain: Not on file  . Food insecurity    Worry: Not on file    Inability: Not on file  . Transportation needs    Medical: Not on file    Non-medical: Not on file  Tobacco Use  . Smoking status: Passive Smoke Exposure - Never Smoker  . Smokeless tobacco: Never Used  . Tobacco comment: grandson  smokes, but in his car  Substance and Sexual Activity  . Alcohol use: Yes    Comment: rare use  . Drug use: No  . Sexual activity: Not Currently  Lifestyle  . Physical activity    Days per week: Not on file    Minutes per session: Not on file  . Stress: Not on file  Relationships  . Social Herbalist on phone: Not on file    Gets together: Not on file    Attends religious service: Not on file     Active member of club or organization: Not on file    Attends meetings of clubs or organizations: Not on file    Relationship status: Not on file  . Intimate partner violence    Fear of current or ex partner: Not on file    Emotionally abused: Not on file    Physically abused: Not on file    Forced sexual activity: Not on file  Other Topics Concern  . Not on file  Social History Narrative   She is currently single.  Has 2 girls.  Has some college education.  Currently disabled due to chronic back pain and not employed.   07/01/19 lives with brother Shanon Brow   Caffeine, none    Past Medical History, Surgical history, Social history, and Family history were reviewed and updated as appropriate.   Please see review of systems for further details on the patient's review from today.   Objective:   Physical Exam:  There were no vitals taken for this visit.  Physical Exam Neurological:     Mental Status: She is alert and oriented to person, place, and time.     Cranial Nerves: No dysarthria.  Psychiatric:        Attention and Perception: Attention normal.        Mood and Affect: Mood normal.        Speech: Speech normal.        Behavior: Behavior is cooperative.        Thought Content: Thought content normal. Thought content is not paranoid or delusional. Thought content does not include homicidal or suicidal ideation. Thought content does not include homicidal or suicidal plan.        Cognition and Memory: Cognition and memory normal.        Judgment: Judgment normal.     Lab Review:     Component Value Date/Time   NA 141 08/14/2019 0859   NA 134 (A) 05/23/2017   K 3.0 (L) 08/14/2019 0859   CL 103 08/14/2019 0859   CO2 28 08/14/2019 0859   GLUCOSE 120 (H) 08/14/2019 0859   BUN 17 08/14/2019 0859   BUN 14 05/23/2017   CREATININE 0.81 08/14/2019 0859   CALCIUM 9.8 08/14/2019 0859   PROT 7.3 04/28/2019 1712   ALBUMIN 4.2 04/28/2019 1712   AST 21 04/28/2019 1712   ALT 30  04/28/2019 1712   ALKPHOS 68 04/28/2019 1712   BILITOT 0.5 04/28/2019 1712   GFRNONAA >60 08/14/2019 0859   GFRAA >60 08/14/2019 0859       Component Value Date/Time   WBC 4.8 08/14/2019 0859   RBC 4.86 08/14/2019 0859   HGB 12.8 08/14/2019 0859   HGB 12.5 11/08/2018 1041   HCT 41.9 08/14/2019 0859   HCT 39.5 11/08/2018 1041   PLT 289 08/14/2019 0859   MCV 86.2 08/14/2019 0859   MCV 81 11/08/2018 1041   MCH 26.3 08/14/2019 0859  MCHC 30.5 08/14/2019 0859   RDW 14.9 08/14/2019 0859   RDW 14.3 11/08/2018 1041   LYMPHSABS 2.1 04/28/2019 1712   LYMPHSABS 1.4 11/08/2018 1041   MONOABS 0.5 04/28/2019 1712   EOSABS 0.3 04/28/2019 1712   EOSABS 0.2 11/08/2018 1041   BASOSABS 0.0 04/28/2019 1712   BASOSABS 0.0 11/08/2018 1041    No results found for: POCLITH, LITHIUM   No results found for: PHENYTOIN, PHENOBARB, VALPROATE, CBMZ   .res Assessment: Plan:   Will continue current plan of care since target signs and symptoms are well controlled without any tolerability issues. Mood, anxiety, and insomnia s/s are stable.  Pt to f/u in 3 months or sooner if clinically indicated. Recommend continuing therapy with Rinaldo Cloud, LCSW. Christeen was seen today for follow-up.  Diagnoses and all orders for this visit:  Anxiety disorder, unspecified type -     busPIRone (BUSPAR) 15 MG tablet; Take 1.5 tablets (22.5 mg total) by mouth 2 (two) times daily.  Bipolar II disorder ([place])  Primary insomnia    Please see After Visit Summary for patient specific instructions.  Future Appointments  Date Time Provider [facility]  10/03/2019  2:30 PM Chelsea Benson, PMHNP [facility] None  10/17/2019 12:00 PM Shanon Ace, LCSW [facility] None  02/05/2020 10:20 AM Padgett, Rae Halsted, MD [facility] None    No orders of the defined types were placed in this encounter.     -------------------------------

## 2019-09-25 ENCOUNTER — Ambulatory Visit (INDEPENDENT_AMBULATORY_CARE_PROVIDER_SITE_OTHER): Payer: 59 | Admitting: Psychiatry

## 2019-09-25 ENCOUNTER — Other Ambulatory Visit: Payer: Self-pay

## 2019-09-25 DIAGNOSIS — F419 Anxiety disorder, unspecified: Secondary | ICD-10-CM
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
## 2019-10-03 ENCOUNTER — Ambulatory Visit: Payer: 59 | Admitting: Psychiatry

## 2019-10-17 ENCOUNTER — Ambulatory Visit (INDEPENDENT_AMBULATORY_CARE_PROVIDER_SITE_OTHER): Payer: 59 | Admitting: Psychiatry

## 2019-10-17 ENCOUNTER — Other Ambulatory Visit: Payer: Self-pay

## 2019-10-17 DIAGNOSIS — F419 Anxiety disorder, unspecified: Secondary | ICD-10-CM
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
## 2019-10-25 ENCOUNTER — Other Ambulatory Visit: Payer: Self-pay | Admitting: Internal Medicine

## 2019-10-30 ENCOUNTER — Ambulatory Visit: Payer: 59 | Admitting: Podiatry

## 2019-11-14 ENCOUNTER — Other Ambulatory Visit: Payer: Self-pay

## 2019-11-14 ENCOUNTER — Ambulatory Visit (INDEPENDENT_AMBULATORY_CARE_PROVIDER_SITE_OTHER): Payer: 59 | Admitting: Psychiatry

## 2019-11-14 DIAGNOSIS — F419 Anxiety disorder, unspecified: Secondary | ICD-10-CM | POA: Diagnosis not present
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
## 2019-11-26 ENCOUNTER — Other Ambulatory Visit: Payer: Self-pay | Admitting: *Deleted

## 2019-11-26 MED ORDER — OLOPATADINE HCL 0.7 % OP SOLN: 1.0000 [drp] | Freq: Every day | OPHTHALMIC | 0 refills | Status: DC | PRN

## 2019-12-04 ENCOUNTER — Telehealth: Payer: Self-pay | Admitting: Allergy

## 2019-12-04 ENCOUNTER — Other Ambulatory Visit: Payer: Self-pay | Admitting: Allergy

## 2019-12-04 MED ORDER — PREDNISONE 10 MG PO TABS: ORAL_TABLET | ORAL | 0 refills | Status: DC

## 2019-12-04 NOTE — Telephone Encounter (Signed)
Call to patient to make her aware of Dr Padgett's recommendations.  Pt verbalized understanding.  Will try to go get tested this week.  She states that her daughter is not off until the weekend and she will probably be able to go then.  But states that she will follow the directions Dr Nelva Bush gave.  Call ended,

## 2019-12-04 NOTE — Telephone Encounter (Signed)
Given symptoms and duration it appears she may have a sinusitis that is leading to an asthma flare.  Most likely viral in nature at this time thus would start to treat with a prednisone course (day pack) at this time for both sinusitis and asthma flare.  She should continue her routine medications.  Given the rising Covid cases in the community would also recommend she have testing done to ensure this is not Covid at this time.  Would also recommend she have a home pulse oximeter so she can ensure she has adequate oxygen saturations at home.  Continue albuterol via inhaler or nebulizer every 4 hours for next 2 days.

## 2019-12-04 NOTE — Telephone Encounter (Signed)
Patient called stating that she is having allergy problems, coughing and sinus drainage-green mucus. Patient states that she does not have a fever and blood pressure is good. Patient states she is taking all allergy medication as directed and using nasal sprays.   Patient called 09/23/2019 with similar problems. Please view telephone call.   Uses [facility] on [address].  Please advise.

## 2019-12-04 NOTE — Telephone Encounter (Signed)
Call to patient, has been having symptoms since Monday. Was tested in September for covid, has not been around anyone, did not do any family gatherings for Christmas.  Has been using her nebulizer 3 times per day for wheezing.  Also c/o her eyes tearing and very watery.

## 2019-12-05 ENCOUNTER — Other Ambulatory Visit: Payer: Self-pay

## 2019-12-05 ENCOUNTER — Encounter (HOSPITAL_COMMUNITY): Payer: Self-pay

## 2019-12-05 ENCOUNTER — Ambulatory Visit (HOSPITAL_COMMUNITY)
Admission: EM | Admit: 2019-12-05 | Discharge: 2019-12-05 | Disposition: A | Discharge status: Home / Self Care | Payer: 59 | Attending: Family Medicine | Admitting: Family Medicine

## 2019-12-05 DIAGNOSIS — J069 Acute upper respiratory infection, unspecified: Secondary | ICD-10-CM | POA: Diagnosis not present

## 2019-12-05 DIAGNOSIS — Z20828 Contact with and (suspected) exposure to other viral communicable diseases: Secondary | ICD-10-CM | POA: Diagnosis not present

## 2019-12-05 DIAGNOSIS — Z20822 Contact with and (suspected) exposure to covid-19: Secondary | ICD-10-CM

## 2019-12-05 DIAGNOSIS — R05 Cough: Secondary | ICD-10-CM | POA: Diagnosis not present

## 2019-12-05 MED ORDER — DM-GUAIFENESIN ER 30-600 MG PO TB12: 1.0000 | ORAL_TABLET | Freq: Two times a day (BID) | ORAL | 0 refills | Status: DC

## 2019-12-05 MED ORDER — ONDANSETRON 4 MG PO TBDP: 4.0000 mg | ORAL_TABLET | Freq: Three times a day (TID) | ORAL | 0 refills | Status: DC | PRN

## 2019-12-05 MED ORDER — PREDNISONE 10 MG PO TABS: ORAL_TABLET | ORAL | 0 refills | Status: AC

## 2019-12-05 MED ORDER — ONDANSETRON 4 MG PO TBDP
ORAL_TABLET | ORAL | Status: AC
  Filled 2019-12-05: qty 1

## 2019-12-05 MED ORDER — ONDANSETRON 4 MG PO TBDP
4.0000 mg | ORAL_TABLET | Freq: Once | ORAL | Status: AC
  Administered 2019-12-05: 4 mg via ORAL

## 2019-12-05 NOTE — ED Triage Notes (Signed)
Pt presents to the [facility] with sinu pressure, nasal congestion and headache x 3 days. Pt reports her PCP sent her here for COVID test.

## 2019-12-05 NOTE — ED Provider Notes (Signed)
[facility]    CSN: [id number] Arrival date & time: 12/05/19  3559      History   Chief Complaint Chief Complaint  Patient presents with  . Nasal Congestion  . COVID test    HPI ISSABELLE Benson is a 68 y.o. female history of asthma, DM type II, OSA, among other complex PMH listed below presenting today for evaluation of URI symptoms.  Patient states that over the past 3 days she has had sinus pressure, nasal congestion and headaches.  She has had a lot of mucus production with rhinorrhea as well as with a productive cough.  She is concerned about her mucus being yellow.  She denies any shortness of breath or chest pain.  She does feel her asthma has flared up and has had started to use her nebulizers again.  She feels as if these are helping her.  She notes that her allergist/asthma specialist sent in a prednisone prescription for her yesterday, but she was unable to pick it up.  She also takes daily Singulair, Flonase and Allegra.  Uses olopatadine for her eyes.  She denies any known exposure to Covid, but was recommended by PCP to come for testing.  She does check her blood pressure at home and states that is typically not elevated.  She denies any chest pain, shortness of breath, vision changes or headaches.  Denies weakness.  She notes that she believes her blood pressure is elevated because while waiting in the waiting room for approximately 1 hour she felt as if her blood sugar was dropping and began to feel nauseous and vomiting.  Denies abdominal pain.  She was hoping to have a quicker visit and believes this is triggered her blood pressure to elevate.  HPI  Past Medical History:  Diagnosis Date  . Allergic rhinoconjunctivitis   . Allergy history unknown   . Anxiety   . Arthritis    DDD, spondylosis  . Asthma   . Bipolar disorder ([place])   . Chronic back pain    Has had several surgeries.  He uses a walker  . Chronic kidney disease    renal calculi  .  Depression   . Diabetes mellitus without complication ([place])    dx in 2007  . Diabetes mellitus, type II ([place])   . Eczema   . Headache(784.0)    sinus related   . History of kidney stones   . Hx of blood clots    lungs  . Hx of pulmonary embolus 2017  . Hyperlipidemia   . Hypertension   . Insomnia   . Knee pain   . Left ankle pain   . Memory change   . Mental disorder   . Mood disorder ([place])   . OSA on CPAP    PSG 05/24/2016: AHI 17/hour 02 mean 76%.  HSAT 06/13/17, AHI 11-hour 02 mean 79%  . Recurrent upper respiratory infection (URI)   . Sleep apnea   . Tremor of both hands     Patient Active Problem List   Diagnosis Date Noted  . Cough variant asthma vs UACS 06/06/2019  . Moderate persistent asthma with acute exacerbation 03/13/2019  . Allergic rhinitis due to allergen 03/13/2019  . Allergic conjunctivitis of both eyes 03/13/2019  . 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  . Anxiety hyperventilation 10/03/2018  . Bipolar II disorder ([place]) 09/18/2018  . Cardiac risk counseling 01/07/2018  . Influenza 10/19/2017  . Cough 10/19/2017  . Allergy to  almonds 10/19/2017  . Flexural atopic dermatitis 10/19/2017  . Allergic rhinoconjunctivitis 10/19/2017  . Moderate persistent asthma, uncomplicated 03/70/4888  . S/P arthroscopy of right knee 06/04/2017  . Acute blood loss as cause of postoperative anemia 06/04/2017  . Mood disorder ([place])   . Allergy history unknown   . Anxiety 05/24/2017  . Diabetes mellitus ([place]) 05/24/2017  . Hyperlipidemia 05/24/2017  . Insomnia 05/24/2017  . Primary osteoarthritis of right knee 05/22/2017  . Osteoarthritis of right knee 05/18/2017  . Allergic rhinitis due to pollen 10/06/2014  . Arthritis 10/06/2014  . Asthma 10/06/2014  . Cubital tunnel syndrome, right 10/06/2014  . Headache 10/06/2014  . Hypertension 10/06/2014  . Depression 08/07/2014  . Postlaminectomy syndrome, lumbar region 09/06/2013  . DDD (degenerative  disc disease), lumbosacral 05/11/2013  . Spinal stenosis of lumbar region 05/11/2013    Past Surgical History:  Procedure Laterality Date  . ABDOMINAL HYSTERECTOMY    . BACK SURGERY     2012- lumbar fusion  . BREAST SURGERY     L cyst removed - 1972  . BUNIONECTOMY Left    Great toe  . calf -R- cyst removed    . CARPAL TUNNEL RELEASE     both hands   . HAMMER TOE SURGERY Left    L foot  . NASAL SINUS SURGERY    . NM MYOVIEW LTD  12/2008   Normal study.  Normal LV function.  EF 61%.  Apical thinning but no ischemia or infarction.  Marland Kitchen SHOULDER ARTHROSCOPY Left    R shoulder- RCR  . TOE SURGERY    . TOTAL KNEE ARTHROPLASTY Right 05/22/2017   Procedure: TOTAL KNEE ARTHROPLASTY;  Surgeon: Frederik Pear, MD;  Location: [facility];  Service: Orthopedics;  Laterality: Right;  . TRANSTHORACIC ECHOCARDIOGRAM  06/2009    Technically difficult.  Moderate concentric LVH with normal LV function.  No regional wall motion normalities.  EF> 55%.  Normal right ventricle.  No valve lesions.  Normal filling pressures.  Marland Kitchen TRIGGER FINGER RELEASE     L thumb  . TUBAL LIGATION      OB History   No obstetric history on file.      Home Medications    Prior to Admission medications   Medication Sig Start Date End Date Taking? Authorizing Provider  ACCU-CHEK GUIDE test strip TEST BID PRN 09/17/18   [provider]  albuterol (PROVENTIL) (2.5 MG/3ML) 0.083% nebulizer solution Take 3 mLs (2.5 mg total) by nebulization every 4 (four) hours as needed for wheezing or shortness of breath. 03/13/19   Kennith Gain, MD  albuterol (VENTOLIN HFA) 108 (90 Base) MCG/ACT inhaler Inhale 2 puffs into the lungs every 4 (four) hours as needed. 09/04/19   Kennith Gain, MD  ARIPiprazole (ABILIFY) 2 MG tablet Take 1 tablet (2 mg total) by mouth daily. 09/08/19 03/06/20  Thayer Headings, PMHNP  aspirin EC 81 MG tablet Take 81 mg by mouth daily.    [provider]  azelastine (ASTELIN) 0.1  % nasal spray Place 1 spray into both nostrils 2 (two) times daily. 09/09/19   Kennith Gain, MD  Blood Glucose Monitoring Suppl (ACCU-CHEK GUIDE) w/Device KIT See admin instructions. 09/18/18   [provider]  budesonide-formoterol (SYMBICORT) 160-4.5 MCG/ACT inhaler Inhale 2 puffs into the lungs 2 (two) times daily. 09/09/19   Kennith Gain, MD  busPIRone (BUSPAR) 15 MG tablet Take 1.5 tablets (22.5 mg total) by mouth 2 (two) times daily. 09/24/19 12/23/19  Thayer Headings,  PMHNP  CLOBETASOL PROPIONATE E 0.05 % emollient cream Apply 1 application topically 2 (two) times daily as needed (rash).  01/22/19   [provider]  desonide (DESOWEN) 0.05 % ointment Apply 1 application topically 2 (two) times daily as needed. 09/09/19   Kennith Gain, MD  dextromethorphan-guaiFENesin [facility] DM) 30-600 MG 12hr tablet Take 1 tablet by mouth 2 (two) times daily. 12/05/19   Christian Borgerding C, PA-C  diltiazem (CARDIZEM CD) 180 MG 24 hr capsule Take 180 mg by mouth daily.  07/02/19   [provider]  doxycycline (VIBRA-TABS) 100 MG tablet  08/29/19   [provider]  EPINEPHrine (EPIPEN 2-PAK) 0.3 mg/0.3 mL IJ SOAJ injection Inject 0.3 mLs (0.3 mg total) into the muscle as needed. 09/09/19   Kennith Gain, MD  famotidine (PEPCID) 20 MG tablet Take 1 tablet (20 mg total) by mouth daily after supper. 09/06/19   Tanda Rockers, MD  fexofenadine (ALLEGRA) 180 MG tablet Take 1 tablet (180 mg total) by mouth daily. 09/04/19   Kennith Gain, MD  fluticasone (FLONASE) 50 MCG/ACT nasal spray Place 2 sprays into both nostrils daily. 09/09/19   Kennith Gain, MD  hydrochlorothiazide (HYDRODIURIL) 25 MG tablet Take 25 mg by mouth daily.  05/06/14   [provider]  HYDROcodone-acetaminophen (NORCO/VICODIN) 5-325 MG tablet Take 1 tablet by mouth every 4 (four) hours as needed for moderate pain. 08/23/19 08/22/20  Meyran,  Ocie Cornfield, NP  hydrOXYzine (ATARAX/VISTARIL) 25 MG tablet TAKE 1 TABLET(25 MG) BY MOUTH EVERY 6 HOURS AS NEEDED 09/08/19   Thayer Headings, PMHNP  Mepolizumab (NUCALA) 100 MG/ML SOAJ Inject 100 mg into the skin every 30 (thirty) days.    [provider]  metFORMIN (GLUCOPHAGE-XR) 500 MG 24 hr tablet Take 500 mg by mouth 2 (two) times daily with a meal.  07/02/19   [provider]  methylPREDNISolone (MEDROL DOSEPAK) 4 MG TBPK tablet  08/29/19   [provider]  mirtazapine (REMERON) 30 MG tablet TAKE 1 TABLET(30 MG) BY MOUTH AT BEDTIME 09/08/19   Thayer Headings, PMHNP  montelukast (SINGULAIR) 10 MG tablet Take 1 tablet (10 mg total) by mouth at bedtime. 09/09/19   Kennith Gain, MD  mupirocin ointment (BACTROBAN) 2 % APPLY NASALLY 2 TIMES DAILY FOR 5 DAYS PRIOR TO SURGERY 08/14/19   [provider]  Olopatadine HCl (PAZEO) 0.7 % SOLN Apply 1 drop to eye daily. 09/09/19   Kennith Gain, MD  Olopatadine HCl 0.7 % SOLN Apply 1 drop to eye daily as needed. 11/26/19   Kozlow, Donnamarie Poag, MD  ondansetron (ZOFRAN ODT) 4 MG disintegrating tablet Take 1 tablet (4 mg total) by mouth every 8 (eight) hours as needed for nausea or vomiting. 12/05/19   Brittnae Aschenbrenner C, PA-C  Oxcarbazepine (TRILEPTAL) 300 MG tablet Take 2 tablets (600 mg total) by mouth at bedtime. 09/08/19 03/06/20  Thayer Headings, PMHNP  oxybutynin (DITROPAN-XL) 10 MG 24 hr tablet Take 10 mg by mouth daily.  05/08/14   [provider]  oxyCODONE-acetaminophen (PERCOCET) 10-325 MG tablet Take 1 tablet by mouth every 8 (eight) hours as needed for pain.     [provider]  OZEMPIC, 0.25 OR 0.5 MG/DOSE, 2 MG/1.5ML SOPN Inject 0.5 mg into the skin every 7 (seven) days.  06/17/19   [provider]  pantoprazole (PROTONIX) 40 MG tablet TAKE 1 TABLET BY MOUTH  DAILY. TAKE 30 TO 60  MINUTES BEFORE FIRST MEAL  OF THE DAY 10/25/19  Tanda Rockers, MD  potassium chloride SA  (K-DUR) 20 MEQ tablet Take 20 mEq by mouth daily.    [provider]  pravastatin (PRAVACHOL) 20 MG tablet Take 20 mg by mouth at bedtime.  02/03/17   [provider]  predniSONE (DELTASONE) 10 MG tablet Take 2 tablets (20 mg total) by mouth 2 (two) times daily with a meal for 3 days, THEN 2 tablets (20 mg total) daily with breakfast for 2 days, THEN 1 tablet (10 mg total) daily with breakfast for 1 day. 12/05/19 12/11/19  Erlin Gardella C, PA-C  pregabalin (LYRICA) 100 MG capsule Take 100 mg by mouth 3 (three) times daily.    [provider]  Respiratory Therapy Supplies (NEBULIZER) DEVI Use as directed with nebulizer solution. 06/01/18   Valentina Shaggy, MD  Respiratory Therapy Supplies (NEBULIZER/TUBING/MOUTHPIECE) KIT Use as directed with nebulizer machine 05/31/19   Padgett, Rae Halsted, MD  tiZANidine (ZANAFLEX) 2 MG tablet Take 1 tablet (2 mg total) by mouth every 6 (six) hours as needed for muscle spasms. 08/23/19   Meyran, Ocie Cornfield, NP  zolpidem (AMBIEN) 10 MG tablet Take 0.5-1 tablets (5-10 mg total) by mouth at bedtime as needed for sleep. 09/09/19   Thayer Headings, PMHNP    Family History Family History  Problem Relation Age of Onset  . Diabetes Sister   . Diabetes Brother   . Hypertension Brother   . Cancer Maternal Grandmother   . Heart failure Maternal Grandmother   . Hypertension Paternal Grandmother   . Asthma Daughter   . Cancer Daughter   . Depression Daughter   . Hypertension Daughter   . Heart failure Maternal Aunt   . Pneumonia Mother   . Diabetes Mother   . Alcoholism Father   . Alcohol abuse Father   . Depression Daughter   . Schizophrenia Grandchild   . Allergies Neg Hx   . Eczema Neg Hx   . Immunodeficiency Neg Hx     Social History Social History   Tobacco Use  . Smoking status: Passive Smoke Exposure - Never Smoker  . Smokeless tobacco: Never Used  . Tobacco comment: grandson smokes, but in his car  Substance  Use Topics  . Alcohol use: Yes    Comment: rare use  . Drug use: No     Allergies   Carvedilol, Cymbalta [duloxetine hcl], Fish allergy, Morphine and related, Rosuvastatin calcium, Almond meal, Almond oil, and Losartan potassium   Review of Systems Review of Systems  Constitutional: Negative for activity change, appetite change, chills, fatigue and fever.  HENT: Positive for congestion, rhinorrhea and sinus pressure. Negative for ear pain, sore throat and trouble swallowing.   Eyes: Negative for discharge and redness.  Respiratory: Positive for cough and wheezing. Negative for chest tightness and shortness of breath.   Cardiovascular: Negative for chest pain.  Gastrointestinal: Positive for nausea and vomiting. Negative for abdominal pain and diarrhea.  Musculoskeletal: Negative for myalgias.  Skin: Negative for rash.  Neurological: Negative for dizziness, light-headedness and headaches.     Physical Exam Triage Vital Signs ED Triage Vitals  Enc Vitals Group     BP 12/05/19 1024 (!) 210/107     Pulse Rate 12/05/19 1024 75     Resp 12/05/19 1024 18     Temp 12/05/19 1024 97.7 F (36.5 C)     Temp Source 12/05/19 1024 Oral     SpO2 12/05/19 1024 97 %     Weight --  Height --      Head Circumference --      Peak Flow --      Pain Score 12/05/19 1022 6     Pain Loc --      Pain Edu? --      Excl. in [place]? --    No data found.  Updated Vital Signs BP (!) 210/107 (BP Location: Right Arm)   Pulse 75   Temp 97.7 F (36.5 C) (Oral)   Resp 18   SpO2 97%  Blood pressure rechecked and continues to be elevated -212/110 Visual Acuity Right Eye Distance:   Left Eye Distance:   Bilateral Distance:    Right Eye Near:   Left Eye Near:    Bilateral Near:     Physical Exam Vitals and nursing note reviewed.  Constitutional:      General: She is not in acute distress.    Appearance: She is well-developed.  HENT:     Head: Normocephalic and atraumatic.     Ears:      Comments: Bilateral ears without tenderness to palpation of external auricle, tragus and mastoid, EAC's without erythema or swelling, TM's with good bony landmarks and cone of light. Non erythematous.     Mouth/Throat:     Comments: Oral mucosa pink and moist, no tonsillar enlargement or exudate. Posterior pharynx patent and nonerythematous, no uvula deviation or swelling. Normal phonation. Eyes:     Conjunctiva/sclera: Conjunctivae normal.     Comments: Conjunctive appears mildly erythematous and swollen  Cardiovascular:     Rate and Rhythm: Normal rate and regular rhythm.     Heart sounds: No murmur.  Pulmonary:     Effort: Pulmonary effort is normal. No respiratory distress.     Breath sounds: Normal breath sounds.     Comments: Breathing comfortably at rest, CTABL, no wheezing, rales or other adventitious sounds auscultated Abdominal:     Palpations: Abdomen is soft.     Tenderness: There is no abdominal tenderness.  Musculoskeletal:     Cervical back: Neck supple.  Skin:    General: Skin is warm and dry.  Neurological:     Mental Status: She is alert.      UC Treatments / Results  Labs (all labs ordered are listed, but only abnormal results are displayed) Labs Reviewed  NOVEL CORONAVIRUS, NAA (HOSP ORDER, SEND-OUT TO REF LAB; TAT 18-24 HRS)    EKG   Radiology No results found.  Procedures Procedures (including critical care time)  Medications Ordered in UC Medications  ondansetron (ZOFRAN-ODT) disintegrating tablet 4 mg (4 mg Oral Given 12/05/19 1059)    Initial Impression / Assessment and Plan / UC Course  I have reviewed the triage vital signs and the nursing notes.  Pertinent labs & imaging results that were available during my care of the patient were reviewed by me and considered in my medical decision making (see chart for details).     Covid swab pending, URI symptoms x3 days, most likely viral, possible Covid.  Will treat symptomatically and  supportively, will add in Mucinex DM for congestion, recent prednisone prescription with same instructions from allergist from yesterday to make sure that this is at the pharmacy for patient to begin.  Rest and drink plenty of fluids.  Zofran for vomiting.  Discussed elevated blood pressure with patient, currently asymptomatic, advised to monitor at home to return to normal reading and to follow-up if developing any red flag or concerning symptoms with elevated blood pressure.  Discussed strict return precautions. Patient verbalized understanding and is agreeable with plan.  Final Clinical Impressions(s) / UC Diagnoses   Final diagnoses:  Viral URI with cough  Encounter for laboratory testing for COVID-19 virus     Discharge Instructions     Covid swab pending, monitor my chart for results, results return in approximately 2 days If positive please quarantine for 10 days, until symptoms improving and fever free for 24 hours Please rest and drink plenty of fluids Continue allergy and asthma medicines May add in Mucinex DM as needed twice daily for cough and congestion/mucus Prednisone prescription resent to your pharmacy that you are asthma specialist sent in yesterday  Continue to monitor temperature, breathing, follow-up if symptoms progressing or worsening   ED Prescriptions    Medication Sig Dispense Auth. Provider   predniSONE (DELTASONE) 10 MG tablet Take 2 tablets (20 mg total) by mouth 2 (two) times daily with a meal for 3 days, THEN 2 tablets (20 mg total) daily with breakfast for 2 days, THEN 1 tablet (10 mg total) daily with breakfast for 1 day. 17 tablet Miranda Frese C, PA-C   dextromethorphan-guaiFENesin (MUCINEX DM) 30-600 MG 12hr tablet Take 1 tablet by mouth 2 (two) times daily. 20 tablet Markus Casten C, PA-C   ondansetron (ZOFRAN ODT) 4 MG disintegrating tablet Take 1 tablet (4 mg total) by mouth every 8 (eight) hours as needed for nausea or vomiting. 20 tablet  Lanissa Cashen, [place] C, PA-C     PDMP not reviewed this encounter.   Janith Lima, [place] 12/05/19 1126

## 2019-12-05 NOTE — Discharge Instructions (Signed)
Covid swab pending, monitor my chart for results, results return in approximately 2 days If positive please quarantine for 10 days, until symptoms improving and fever free for 24 hours Please rest and drink plenty of fluids Continue allergy and asthma medicines May add in Mucinex DM as needed twice daily for cough and congestion/mucus Prednisone prescription resent to your pharmacy that you are asthma specialist sent in yesterday  Continue to monitor temperature, breathing, follow-up if symptoms progressing or worsening

## 2019-12-05 NOTE — ED Notes (Signed)
Vital sign reported to Provider H. Wieters  

## 2019-12-06 LAB — NOVEL CORONAVIRUS, NAA (HOSP ORDER, SEND-OUT TO REF LAB; TAT 18-24 HRS): SARS-CoV-2, NAA: NOT DETECTED

## 2019-12-09 ENCOUNTER — Other Ambulatory Visit: Payer: Self-pay

## 2019-12-09 MED ORDER — PAZEO 0.7 % OP SOLN: 1.0000 [drp] | Freq: Every day | OPHTHALMIC | 1 refills | Status: DC

## 2019-12-17 ENCOUNTER — Encounter: Payer: 59 | Admitting: Psychiatry

## 2019-12-20 ENCOUNTER — Telehealth: Payer: Self-pay

## 2019-12-20 MED ORDER — BEPREVE 1.5 % OP SOLN: OPHTHALMIC | 1 refills | Status: DC

## 2019-12-20 NOTE — Addendum Note (Signed)
Addended by: Mliss Fritz I on: 12/20/2019 10:59 AM   Modules accepted: Orders

## 2019-12-20 NOTE — Telephone Encounter (Signed)
Patient needs alternative sent for Pazeo as the pharmacy is out of stock. Please advise and thank you.

## 2019-12-20 NOTE — Telephone Encounter (Signed)
Chelsea Benson has been sent to [facility] on [address]. We may have to try a different medication if the insurance will not cover the Bepreve.

## 2019-12-20 NOTE — Telephone Encounter (Signed)
Appears she has only been on olopatadine eye drops.  If not able to get OTC then we can try to see if either of these options will be covered by her plan:  - bepreve 1 drop each eye twice a day   - epinastine 1 drop each eye twice a day  - azelastine 1 drop each eye twice a day  - ketotifen 1 drop each eye twice a day

## 2019-12-23 ENCOUNTER — Ambulatory Visit (INDEPENDENT_AMBULATORY_CARE_PROVIDER_SITE_OTHER): Payer: 59 | Admitting: Psychiatry

## 2019-12-23 DIAGNOSIS — F419 Anxiety disorder, unspecified: Secondary | ICD-10-CM | POA: Diagnosis not present

## 2019-12-23 NOTE — Progress Notes (Signed)
[facility] Counselor/Therapist Progress Note  Patient ID: Chelsea Benson, MRN: [id number],    Date: 12/23/2019  Time Spent:  60 minutes 10:00am to 11:00am   Virtual Visit Note Connected with patient by a video enabled telemedicine/telehealth application or telephone, with their informed consent, and verified patient privacy and that I am speaking with the correct person using two identifiers. I discussed the limitations, risks, security and privacy concerns of performing psychotherapy and management service by telephone and the availability of in person appointments. I also discussed with the patient that there may be a patient responsible charge related to this service. The patient expressed understanding and agreed to proceed. I discussed the treatment planning with the patient. The patient was provided an opportunity to ask questions and all were answered. The patient agreed with the plan and demonstrated an understanding of the instructions. The patient was advised to call  our office if  symptoms worsen or feel they are in a crisis state and need immediate contact.   Therapist Location: [facility] Patient Location: home  Treatment Type: Individual Therapy  Reported Symptoms: anxiety, concerned about recent bout of vertigo "but am better now" , some fearful thinking about world situations, some depression.   Problems continue with her brother and she is trying to set better boundaries  Mental Status Exam:  Appearance:   n/a  telehealth     Behavior:  Appropriate and Sharing  Motor:  n/a  telehealth  Speech/Language:   Normal Rate  Affect:  anxious, some fears, some depression  Mood:  anxious and depressed  Thought process:  goal directed  Thought content:    WNL  Sensory/Perceptual disturbances:    WNL  Orientation:  oriented to person, place, time/date, situation, day of week, month of year and year  Attention:  Good  Concentration:  Good  Memory:  some  forgetfulness  Fund of knowledge:   Good  Insight:    Good  Judgment:   Good  Impulse Control:  Good   Risk Assessment: Danger to Self:  No Self-injurious Behavior: No Danger to Others: No Duty to Warn:no Physical Aggression / Violence:No  Access to Firearms a concern: No  Gang Involvement:No   Subjective: Patient in today reporting recent bouts of nausea and vertigo, but is better now.  Anxious, frustrated, some fearful thinking.   Interventions: Solution-Oriented/Positive Psychology; CBT  Diagnosis:   ICD-10-CM   1. Anxiety disorder, unspecified type  F41.9     Plan: Patient not signing tx goals on computer screen due to COVID.  Treatment Goals: Goals remain on tx plan as patient works on strategies to meet her goals. Progress will be documented each visit in "Progress" section on Plan.   Long term goal: Reduce overall level, frequency, and intensity of the anxiety so that daily functioning is not impaired.   Short term goal: Increase understanding of beliefs and messages that produce the worry and anxiety.  Strategy: Patient will explore and work to stop cognitive messages that feed anxiety and work to replace them with more positive and empowering messages.   PROGRESS: Patient today reporting anxiety, depression, fears, and recent vertigo and nausea.  Reports she is in contact with her Dr about this and home health is going to set up an assessment per patient. Patient reports lots of anxiety and fear about "all the violence and racial/political issues going in our nation.  Lots of fearful thinking and admits she is watching a lot of TV where  these things are being reported and discussed, which escalates her anxiety and fears even more. Used session to help patient realize how all the TV watching is working against her, and also to better understand the strength of her negative/anxious/fearful thoughts.  Practiced with patient how to change some of her thoughts  that she is overly focused on right now.  Worked on interrupting the anxious/negative/fearful thoughts and replacing them with more positive, reality-based, and more hopeful/less fearful thought patterns that do not support anxiety and fears. Encourage patient to keep practicing this between sessions and to decrease her consumption of TV news.  Encouraged contact with others, good self-care (mental, physical), and watching some of her favorite programs that are entertaining for her (versus the news channels).  Was calmer by session end and able to speak out some things that are more of a comfort to her.  Denies any SI. Goal review and progress noted.   Next appt is within 2-3 weeks.  Shanon Ace, LCSW

## 2019-12-31 ENCOUNTER — Ambulatory Visit (INDEPENDENT_AMBULATORY_CARE_PROVIDER_SITE_OTHER): Payer: 59 | Admitting: Psychiatry

## 2019-12-31 DIAGNOSIS — F419 Anxiety disorder, unspecified: Secondary | ICD-10-CM | POA: Diagnosis not present

## 2019-12-31 NOTE — Progress Notes (Signed)
      [facility] Counselor/Therapist Progress Note  Patient ID: Chelsea Benson, MRN: [id number],    Date: 12/31/2019  Time Spent: 60 minutes  8:00am to 9:00am  Treatment Type: Individual Therapy  Reported Symptoms:  Anxiety (improving some), depression (mild)  Mental Status Exam:  Appearance:   n/a  telehealth     Behavior:  Sharing  Motor:  n/a   telehealth  Speech/Language:   Normal Rate  Affect:  n/a  telehealth  Mood:  anxiety and depression (is improving some)  Thought process:  goal directed  Thought content:    WNL  Sensory/Perceptual disturbances:    WNL  Orientation:  oriented to person, place, time/date, situation, day of week, month of year and year  Attention:  Good  Concentration:  Good  Memory:  WNL "with some occasional forgetting"  Fund of knowledge:   Good  Insight:    Good  Judgment:   Good  Impulse Control:  Good   Risk Assessment: Danger to Self:  No Self-injurious Behavior: No Danger to Others: No Duty to Warn:no Physical Aggression / Violence:No  Access to Firearms a concern: No  Gang Involvement:No   Subjective: Patient today reporting some lessening in her anxiety and depression.  "Feeling more optimistic after the inauguration day".  Saw family recently and that was good for her. Reports no new meds nor medical concerns.   Interventions: Cognitive Behavioral Therapy and Solution-Oriented/Positive Psychology  Diagnosis:   ICD-10-CM   1. Anxiety disorder, unspecified type  F41.9     Plan: Patient not signing tx goals on computer screen due to COVID.  Treatment Goals: Goals remain on tx plan as patient works on strategies to meet her goals. Progress will be documented each visit in "Progress" section on Plan.   Long term goal: Reduce overall level, frequency, and intensity of the anxiety so that daily functioning is not impaired.   Short term goal: Increase understanding of beliefs and messages that produce the worry and  anxiety.  Strategy: Patient will explore and work to stop cognitive messages that feed anxiety and work to replace them with more positive and empowering messages.   PROGRESS: Patient today reporting still working on her goals of better managing and reducing her anxiety and depression.  Still being evaluated by doctor for vertigo and nausea as noted last session. Patient states she is dealing some better with this and feels it is improving a little. Working to decrease the frequency and intensity of her anxiety and we discussed this more today, using specific examples that relate to her. She is making progress and is more aware of that progress in today's session.  Showing more awareness of "things that upset me" and is using some of her skills to proactively manage stressors that feed her anxiety/depression. Shared some of the ways she is calming herself at times when normally she could become very "emotional" really quick and not think before acting.  Fearful thoughts are not present today and she is "feeling relieved about that." Review of goals and progress/efforts noted with patient.  Next appt within 2 weeks.   Mathis Fare, LCSW

## 2020-01-05 ENCOUNTER — Other Ambulatory Visit: Payer: Self-pay | Admitting: Internal Medicine

## 2020-01-09 ENCOUNTER — Ambulatory Visit (INDEPENDENT_AMBULATORY_CARE_PROVIDER_SITE_OTHER): Payer: 59 | Admitting: Otolaryngology

## 2020-01-09 ENCOUNTER — Other Ambulatory Visit: Payer: Self-pay

## 2020-01-09 ENCOUNTER — Encounter (INDEPENDENT_AMBULATORY_CARE_PROVIDER_SITE_OTHER): Payer: Self-pay | Admitting: Otolaryngology

## 2020-01-09 VITALS — Temp 97.5°F

## 2020-01-09 DIAGNOSIS — H6123 Impacted cerumen, bilateral: Secondary | ICD-10-CM

## 2020-01-09 DIAGNOSIS — H8190 Unspecified disorder of vestibular function, unspecified ear: Secondary | ICD-10-CM

## 2020-01-09 DIAGNOSIS — H8120 Vestibular neuronitis, unspecified ear: Secondary | ICD-10-CM | POA: Diagnosis not present

## 2020-01-09 NOTE — Progress Notes (Signed)
HPI: Chelsea Benson is a 68 y.o. female who presents is referred by her PCP for evaluation of chronic dizziness where she is having trouble with her balance.  This began initially back toward the end of December when she apparently had bad vertigo for 3 days.  Since that time she has had chronic imbalance.  She has also described ear pressure and slightly muffled hearing with slight tenderness in the left ear today.  She has also complained of a sore throat more on the left side but no real trouble swallowing.  She just recently finished an antibiotic for the earache and sore throat. She has not had a hearing test recently..  Past Medical History:  Diagnosis Date  . Allergic rhinoconjunctivitis   . Allergy history unknown   . Anxiety   . Arthritis    DDD, spondylosis  . Asthma   . Bipolar disorder ([place])   . Chronic back pain    Has had several surgeries.  He uses a walker  . Chronic kidney disease    renal calculi  . Depression   . Diabetes mellitus without complication ([place])    dx in 2007  . Diabetes mellitus, type II ([place])   . Eczema   . Headache(784.0)    sinus related   . History of kidney stones   . Hx of blood clots    lungs  . Hx of pulmonary embolus 2017  . Hyperlipidemia   . Hypertension   . Insomnia   . Knee pain   . Left ankle pain   . Memory change   . Mental disorder   . Mood disorder ([place])   . OSA on CPAP    PSG 05/24/2016: AHI 17/hour 02 mean 76%.  HSAT 06/13/17, AHI 11-hour 02 mean 79%  . Recurrent upper respiratory infection (URI)   . Sleep apnea   . Tremor of both hands    Past Surgical History:  Procedure Laterality Date  . ABDOMINAL HYSTERECTOMY    . BACK SURGERY     2012- lumbar fusion  . BREAST SURGERY     L cyst removed - 1972  . BUNIONECTOMY Left    Great toe  . calf -R- cyst removed    . CARPAL TUNNEL RELEASE     both hands   . HAMMER TOE SURGERY Left    L foot  . NASAL SINUS SURGERY    . NM MYOVIEW LTD  12/2008   Normal study.   Normal LV function.  EF 61%.  Apical thinning but no ischemia or infarction.  Marland Kitchen SHOULDER ARTHROSCOPY Left    R shoulder- RCR  . TOE SURGERY    . TOTAL KNEE ARTHROPLASTY Right 05/22/2017   Procedure: TOTAL KNEE ARTHROPLASTY;  Surgeon: Frederik Pear, MD;  Location: [facility];  Service: Orthopedics;  Laterality: Right;  . TRANSTHORACIC ECHOCARDIOGRAM  06/2009    Technically difficult.  Moderate concentric LVH with normal LV function.  No regional wall motion normalities.  EF> 55%.  Normal right ventricle.  No valve lesions.  Normal filling pressures.  Marland Kitchen TRIGGER FINGER RELEASE     L thumb  . TUBAL LIGATION     Social History   Socioeconomic History  . Marital status: Single    Spouse name: Not on file  . Number of children: 2  . Years of education: Not on file  . Highest education level: Some college, no degree  Occupational History    Comment: NA  Tobacco Use  . Smoking  status: Passive Smoke Exposure - Never Smoker  . Smokeless tobacco: Never Used  . Tobacco comment: grandson smokes, but in his car  Substance and Sexual Activity  . Alcohol use: Yes    Comment: rare use  . Drug use: No  . Sexual activity: Not Currently  Other Topics Concern  . Not on file  Social History Narrative   She is currently single.  Has 2 girls.  Has some college education.  Currently disabled due to chronic back pain and not employed.   07/01/19 lives with brother Shanon Brow   Caffeine, none   Social Determinants of Health   Financial Resource Strain:   . Difficulty of Paying Living Expenses: Not on file  Food Insecurity:   . Worried About Charity fundraiser in the Last Year: Not on file  . Ran Out of Food in the Last Year: Not on file  Transportation Needs:   . Lack of Transportation (Medical): Not on file  . Lack of Transportation (Non-Medical): Not on file  Physical Activity:   . Days of Exercise per Week: Not on file  . Minutes of Exercise per Session: Not on file  Stress:   . Feeling of Stress :  Not on file  Social Connections:   . Frequency of Communication with Friends and Family: Not on file  . Frequency of Social Gatherings with Friends and Family: Not on file  . Attends Religious Services: Not on file  . Active Member of Clubs or Organizations: Not on file  . Attends Archivist Meetings: Not on file  . Marital Status: Not on file   Family History  Problem Relation Age of Onset  . Diabetes Sister   . Diabetes Brother   . Hypertension Brother   . Cancer Maternal Grandmother   . Heart failure Maternal Grandmother   . Hypertension Paternal Grandmother   . Asthma Daughter   . Cancer Daughter   . Depression Daughter   . Hypertension Daughter   . Heart failure Maternal Aunt   . Pneumonia Mother   . Diabetes Mother   . Alcoholism Father   . Alcohol abuse Father   . Depression Daughter   . Schizophrenia Grandchild   . Allergies Neg Hx   . Eczema Neg Hx   . Immunodeficiency Neg Hx    Allergies  Allergen Reactions  . Carvedilol Cough  . Cymbalta [Duloxetine Hcl] Other (See Comments)    Caused hair loss  . Fish Allergy Itching    Halibut fish  . Morphine And Related Itching  . Rosuvastatin Calcium Other (See Comments) and Cough    Rapid heart rate  . Almond Meal Itching and Swelling    Tongue swells and itches  . Almond Oil Itching and Swelling    Itching and swelling of tongue   . Losartan Potassium Cough   Prior to Admission medications   Medication Sig Start Date End Date Taking? Authorizing Provider  ACCU-CHEK GUIDE test strip TEST BID PRN 09/17/18  Yes [provider]  albuterol (PROVENTIL) (2.5 MG/3ML) 0.083% nebulizer solution Take 3 mLs (2.5 mg total) by nebulization every 4 (four) hours as needed for wheezing or shortness of breath. 03/13/19  Yes Padgett, Rae Halsted, MD  albuterol (VENTOLIN HFA) 108 (90 Base) MCG/ACT inhaler Inhale 2 puffs into the lungs every 4 (four) hours as needed. 09/04/19  Yes Padgett, Rae Halsted, MD   ARIPiprazole (ABILIFY) 2 MG tablet Take 1 tablet (2 mg total) by mouth daily. 09/08/19 03/06/20  Yes Thayer Headings, PMHNP  aspirin EC 81 MG tablet Take 81 mg by mouth daily.   Yes [provider]  azelastine (ASTELIN) 0.1 % nasal spray Place 1 spray into both nostrils 2 (two) times daily. 09/09/19  Yes Padgett, Rae Halsted, MD  Bepotastine Besilate (BEPREVE) 1.5 % SOLN 1 drop in each eye twice a day. 12/20/19  Yes Padgett, Rae Halsted, MD  Blood Glucose Monitoring Suppl (ACCU-CHEK GUIDE) w/Device KIT See admin instructions. 09/18/18  Yes [provider]  budesonide-formoterol (SYMBICORT) 160-4.5 MCG/ACT inhaler Inhale 2 puffs into the lungs 2 (two) times daily. 09/09/19  Yes Padgett, Rae Halsted, MD  CLOBETASOL PROPIONATE E 0.05 % emollient cream Apply 1 application topically 2 (two) times daily as needed (rash).  01/22/19  Yes [provider]  desonide (DESOWEN) 0.05 % ointment Apply 1 application topically 2 (two) times daily as needed. 09/09/19  Yes Padgett, Rae Halsted, MD  dextromethorphan-guaiFENesin [facility] DM) 30-600 MG 12hr tablet Take 1 tablet by mouth 2 (two) times daily. 12/05/19  Yes Wieters, Hallie C, PA-C  diltiazem (CARDIZEM CD) 180 MG 24 hr capsule Take 180 mg by mouth daily.  07/02/19  Yes [provider]  doxycycline (VIBRA-TABS) 100 MG tablet  08/29/19  Yes [provider]  EPINEPHrine (EPIPEN 2-PAK) 0.3 mg/0.3 mL IJ SOAJ injection Inject 0.3 mLs (0.3 mg total) into the muscle as needed. 09/09/19  Yes Kennith Gain, MD  famotidine (PEPCID) 20 MG tablet TAKE 1 TABLET BY MOUTH  DAILY AFTER SUPPER. 01/06/20  Yes Tanda Rockers, MD  fexofenadine (ALLEGRA) 180 MG tablet Take 1 tablet (180 mg total) by mouth daily. 09/04/19  Yes Padgett, Rae Halsted, MD  fluticasone [facility]) 50 MCG/ACT nasal spray Place 2 sprays into both nostrils daily. 09/09/19  Yes Padgett, Rae Halsted, MD  hydrochlorothiazide (HYDRODIURIL)  25 MG tablet Take 25 mg by mouth daily.  05/06/14  Yes [provider]  HYDROcodone-acetaminophen (NORCO/VICODIN) 5-325 MG tablet Take 1 tablet by mouth every 4 (four) hours as needed for moderate pain. 08/23/19 08/22/20 Yes Meyran, Ocie Cornfield, NP  hydrOXYzine (ATARAX/VISTARIL) 25 MG tablet TAKE 1 TABLET(25 MG) BY MOUTH EVERY 6 HOURS AS NEEDED 09/08/19  Yes Thayer Headings, PMHNP  Mepolizumab (NUCALA) 100 MG/ML SOAJ Inject 100 mg into the skin every 30 (thirty) days.   Yes [provider]  metFORMIN (GLUCOPHAGE-XR) 500 MG 24 hr tablet Take 500 mg by mouth 2 (two) times daily with a meal.  07/02/19  Yes [provider]  methylPREDNISolone (MEDROL DOSEPAK) 4 MG TBPK tablet  08/29/19  Yes [provider]  mirtazapine (REMERON) 30 MG tablet TAKE 1 TABLET(30 MG) BY MOUTH AT BEDTIME 09/08/19  Yes Thayer Headings, PMHNP  montelukast (SINGULAIR) 10 MG tablet Take 1 tablet (10 mg total) by mouth at bedtime. 09/09/19  Yes Padgett, Rae Halsted, MD  mupirocin ointment (BACTROBAN) 2 % APPLY NASALLY 2 TIMES DAILY FOR 5 DAYS PRIOR TO SURGERY 08/14/19  Yes [provider]  Olopatadine HCl (PAZEO) 0.7 % SOLN Apply 1 drop to eye daily. 12/09/19  Yes Padgett, Rae Halsted, MD  ondansetron (ZOFRAN ODT) 4 MG disintegrating tablet Take 1 tablet (4 mg total) by mouth every 8 (eight) hours as needed for nausea or vomiting. 12/05/19  Yes Wieters, Hallie C, PA-C  Oxcarbazepine (TRILEPTAL) 300 MG tablet Take 2 tablets (600 mg total) by mouth at bedtime. 09/08/19 03/06/20 Yes Thayer Headings, PMHNP  oxybutynin (DITROPAN-XL) 10 MG 24 hr tablet Take 10 mg by mouth daily.  05/08/14  Yes [provider]  oxyCODONE-acetaminophen (PERCOCET) 10-325 MG tablet Take 1 tablet by mouth every 8 (eight) hours as needed for pain.    Yes [provider]  OZEMPIC, 0.25 OR 0.5 MG/DOSE, 2 MG/1.5ML SOPN Inject 0.5 mg into the skin every 7 (seven) days.  06/17/19  Yes [provider]   pantoprazole (PROTONIX) 40 MG tablet TAKE 1 TABLET BY MOUTH  DAILY. TAKE 30 TO 60  MINUTES BEFORE FIRST MEAL  OF THE DAY 10/25/19  Yes Tanda Rockers, MD  potassium chloride SA (K-DUR) 20 MEQ tablet Take 20 mEq by mouth daily.   Yes [provider]  pravastatin (PRAVACHOL) 20 MG tablet Take 20 mg by mouth at bedtime.  02/03/17  Yes [provider]  pregabalin (LYRICA) 100 MG capsule Take 100 mg by mouth 3 (three) times daily.   Yes [provider]  Respiratory Therapy Supplies (NEBULIZER) DEVI Use as directed with nebulizer solution. 06/01/18  Yes Valentina Shaggy, MD  Respiratory Therapy Supplies (NEBULIZER/TUBING/MOUTHPIECE) KIT Use as directed with nebulizer machine 05/31/19  Yes Padgett, Rae Halsted, MD  tiZANidine (ZANAFLEX) 2 MG tablet Take 1 tablet (2 mg total) by mouth every 6 (six) hours as needed for muscle spasms. 08/23/19  Yes Meyran, Ocie Cornfield, NP  zolpidem (AMBIEN) 10 MG tablet Take 0.5-1 tablets (5-10 mg total) by mouth at bedtime as needed for sleep. 09/09/19  Yes Thayer Headings, PMHNP  busPIRone (BUSPAR) 15 MG tablet Take 1.5 tablets (22.5 mg total) by mouth 2 (two) times daily. 09/24/19 12/23/19  Thayer Headings, PMHNP     Positive ROS: Otherwise negative  All other systems have been reviewed and were otherwise negative with the exception of those mentioned in the HPI and as above.  Physical Exam: Constitutional: Alert, well-appearing, no acute distress.  She is walking with a cane. Ears: External ears without lesions or tenderness.  She had little bit of wax buildup in both ears that was cleaned with curettes.  TMs were clear bilaterally with good mobility on pneumatic otoscopy.  Hearing screening revealed mild hearing loss with AC > BC bilaterally.  Hearing was fairly symmetric.  No signs of infection.  No signs of external otitis.  On Dix-Hallpike testing there was no evidence of benign positional vertigo with no nystagmus or vertigo  noted Nasal: External nose without lesions. Septum with mild deformity and moderate rhinitis.  No signs of infection.. Clear nasal passages Oral: Lips and gums without lesions. Tongue and palate mucosa without lesions. Posterior oropharynx clear.  Tonsil regions appear benign bilaterally.  Indirect laryngoscopy revealed a clear base of tongue vallecula and epiglottis.  Vocal cords appeared clear no hypopharyngeal abnormalities noted. Neck: No palpable adenopathy or masses.  No adenopathy in the neck on either side. Respiratory: Breathing comfortably  Skin: No facial/neck lesions or rash noted.  Cerumen impaction removal  Date/Time: 01/09/2020 5:41 PM Performed by: Rozetta Nunnery, MD Authorized by: Rozetta Nunnery, MD   Consent:    Consent obtained:  Verbal   Consent given by:  Patient   Risks discussed:  Pain and bleeding Procedure details:    Location:  L ear and R ear   Procedure type: curette   Post-procedure details:    Inspection:  TM intact and canal normal   Hearing quality:  Improved   Patient tolerance of procedure:  Tolerated well, no immediate complications Comments:     TMs are clear bilaterally.  No signs of infection.    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 Perhaps mild pharyngitis but no evidence of active infection or evidence of neoplasm.  Plan: We will schedule her for audiogram. Recommended vestibular rehabilitation with select physical therapy. Suggested using throat lozenges for sore throat however this is presently doing better.   Radene Journey, MD   CC:

## 2020-01-13 ENCOUNTER — Encounter: Payer: Self-pay | Admitting: Allergy

## 2020-01-13 ENCOUNTER — Other Ambulatory Visit: Payer: Self-pay

## 2020-01-13 ENCOUNTER — Telehealth: Payer: Self-pay

## 2020-01-13 ENCOUNTER — Ambulatory Visit (INDEPENDENT_AMBULATORY_CARE_PROVIDER_SITE_OTHER): Payer: 59 | Admitting: Allergy

## 2020-01-13 DIAGNOSIS — H1010 Acute atopic conjunctivitis, unspecified eye: Secondary | ICD-10-CM

## 2020-01-13 DIAGNOSIS — J309 Allergic rhinitis, unspecified: Secondary | ICD-10-CM

## 2020-01-13 DIAGNOSIS — T7800XD Anaphylactic reaction due to unspecified food, subsequent encounter: Secondary | ICD-10-CM

## 2020-01-13 DIAGNOSIS — J4540 Moderate persistent asthma, uncomplicated: Secondary | ICD-10-CM | POA: Diagnosis not present

## 2020-01-13 DIAGNOSIS — Z91018 Allergy to other foods: Secondary | ICD-10-CM

## 2020-01-13 DIAGNOSIS — H1013 Acute atopic conjunctivitis, bilateral: Secondary | ICD-10-CM | POA: Diagnosis not present

## 2020-01-13 DIAGNOSIS — J029 Acute pharyngitis, unspecified: Secondary | ICD-10-CM | POA: Insufficient documentation

## 2020-01-13 DIAGNOSIS — J3089 Other allergic rhinitis: Secondary | ICD-10-CM | POA: Diagnosis not present

## 2020-01-13 MED ORDER — PENICILLIN V POTASSIUM 500 MG PO TABS: 500.0000 mg | ORAL_TABLET | Freq: Three times a day (TID) | ORAL | 0 refills | Status: DC

## 2020-01-13 NOTE — Assessment & Plan Note (Signed)
-   Continue Flonase 2 sprays daily as needed.  Use flonase for 1-2 weeks at a time before stopping once symptoms improve. - Continue Astelin nasal spray 2 sprays in each nostril twice a day as needed for nasal drainage/post-nasal drip - Continue saline nasal rinses once a day for nasal symptoms. Use before nasal sprays - Continue Allegra 180 mg daily use (may take additional dose if needed to control allergy symptoms) - Continue Singulair 10 mg at bedtime - Continue P[place] eyedrop 1 drop in each as needed for watery, itchy, red eyes - Continue allergen avoidance measures

## 2020-01-13 NOTE — Assessment & Plan Note (Signed)
.   Daily controller medication(s): continue Symbicort 160 2 puffs twice a day with spacer and rinse mouth afterwards. . Prior to physical activity: May use albuterol rescue inhaler 2 puffs 5 to 15 minutes prior to strenuous physical activities. Marland Kitchen Rescue medications: May use albuterol rescue inhaler 2 puffs or nebulizer every 4 to 6 hours as needed for shortness of breath, chest tightness, coughing, and wheezing. Monitor frequency of use.

## 2020-01-13 NOTE — Telephone Encounter (Signed)
Dr. Selena Batten please advise. This is a patient of Dr. Randell Patient.

## 2020-01-13 NOTE — Assessment & Plan Note (Addendum)
Persistent symptoms for 2 weeks. Granddaughter had strep throat. Treated with amoxicillin 10 days with some benefit but now worsening symptoms. Declines COVID testing.  Based on clinical history and contact history will treat empirically for strep throat.   Start penicillin 500mg  three times a day for 10 days.  Take some over the counter Cepacol or sore throat spray.  Gargle with salt water.  If symptoms not improving within the next few days or worsen, she will need in person evaluation.

## 2020-01-13 NOTE — Telephone Encounter (Signed)
Patient has been placed on schedule for televisit with Dr. Selena Batten.

## 2020-01-13 NOTE — Telephone Encounter (Signed)
She needs televisit for sick visit with [place] as we haven't talked to her about this.  Or she can call her PCP as they just did a televisit with her.   Thank you.

## 2020-01-13 NOTE — Progress Notes (Signed)
RE: Chelsea Benson MRN: [id number] DOB: September 08, 1951 Date of Telemedicine Visit: 01/13/2020  Referring provider: Kathyrn Lass, MD Primary care provider: Kathyrn Lass, MD  Chief Complaint: Sore Throat, Ear Pain, and Cough   Telemedicine Follow Up Visit via Telephone: I connected with Chelsea Benson for a follow up on 01/13/20 by telephone and verified that I am speaking with the correct person using two identifiers.   I discussed the limitations, risks, security and privacy concerns of performing an evaluation and management service by telephone and the availability of in person appointments. I also discussed with the patient that there may be a patient responsible charge related to this service. The patient expressed understanding and agreed to proceed.  Patient is at home/work. Provider is at the office.  Visit start time: 3:04PM Visit end time: 3:37PM Insurance consent/check in by: front desk Medical consent and medical assistant/nurse: Ashleigh F.  History of Present Illness: She is a 69 y.o. female, who is being followed for asthma, allergic rhinitis, food allergies, atopic dermatitis. Her previous allergy office visit was on 09/04/2019 with Dr. Nelva Bush. Today is a new complaint visit of not feeling well.  Patient has been having issues with sore throat. Patient had tele visit with PCP 2 weeks ago and started on amoxicillin 578m x 10 days which helped but now having symptoms again since she finished her antibiotics course. She states initially it was the left sided soreness but now having right sided sore throat with right ear pain.    She is also coughing up some yellow phlegm at times. Denies any fevers or chills. No known covid contacts however grand daughter was diagnosed with strep throat.  She went to ENT last week for dizziness.   She had covid testing in December and was negative and does not wish to get retested as the testing stirred up some dizziness issues.    Currently using cough drops only.   Asthma: Takes her Symbicort 160 2 puffs BID and not used albuterol that much   Allergic rhinitis: Using both Astelin and Flonase nasal spay BID.   Assessment and Plan: Chelsea Benson a 69y.o. female with: Acute pharyngitis Persistent symptoms for 2 weeks. Granddaughter had strep throat. Treated with amoxicillin 10 days with some benefit but now worsening symptoms. Declines COVID testing.  Based on clinical history and contact history will treat empirically for strep throat.   Start penicillin 5042mthree times a day for 10 days.  Take some over the counter Cepacol or sore throat spray.  Gargle with salt water.  If symptoms not improving within the next few days or worsen, she will need in person evaluation.   Moderate persistent asthma, uncomplicated . Daily controller medication(s): continue Symbicort 160 2 puffs twice a day with spacer and rinse mouth afterwards. . Prior to physical activity: May use albuterol rescue inhaler 2 puffs 5 to 15 minutes prior to strenuous physical activities. . Marland Kitchenescue medications: May use albuterol rescue inhaler 2 puffs or nebulizer every 4 to 6 hours as needed for shortness of breath, chest tightness, coughing, and wheezing. Monitor frequency of use.   Anaphylactic shock due to adverse food reaction - Continue to avoid almonds. - Carry EpiPen at all times.  Allergic rhinoconjunctivitis - Continue Flonase 2 sprays daily as needed.  Use flonase for 1-2 weeks at a time before stopping once symptoms improve. - Continue Astelin nasal spray 2 sprays in each nostril twice a day as needed for nasal drainage/post-nasal drip - Continue saline  nasal rinses once a day for nasal symptoms. Use before nasal sprays - Continue Allegra 180 mg daily use (may take additional dose if needed to control allergy symptoms) - Continue Singulair 10 mg at bedtime - Continue Pazeo eyedrop 1 drop in each as needed for watery, itchy, red eyes  - Continue allergen avoidance measures  Return in about 4 weeks (around 02/10/2020).  Meds ordered this encounter  Medications  . penicillin v potassium (VEETID) 500 MG tablet    Sig: Take 1 tablet (500 mg total) by mouth 3 (three) times daily.    Dispense:  30 tablet    Refill:  0   Diagnostics: None.  Medication List:  Current Outpatient Medications  Medication Sig Dispense Refill  . ACCU-CHEK GUIDE test strip TEST BID PRN  1  . albuterol (PROVENTIL) (2.5 MG/3ML) 0.083% nebulizer solution Take 3 mLs (2.5 mg total) by nebulization every 4 (four) hours as needed for wheezing or shortness of breath. 375 mL 1  . albuterol (VENTOLIN HFA) 108 (90 Base) MCG/ACT inhaler Inhale 2 puffs into the lungs every 4 (four) hours as needed. 36 g 1  . ARIPiprazole (ABILIFY) 2 MG tablet Take 1 tablet (2 mg total) by mouth daily. 90 tablet 1  . aspirin EC 81 MG tablet Take 81 mg by mouth daily.    Marland Kitchen azelastine (ASTELIN) 0.1 % nasal spray Place 1 spray into both nostrils 2 (two) times daily. 90 mL 1  . Bepotastine Besilate (BEPREVE) 1.5 % SOLN 1 drop in each eye twice a day. 30 mL 1  . Blood Glucose Monitoring Suppl (ACCU-CHEK GUIDE) w/Device KIT See admin instructions.  1  . budesonide-formoterol (SYMBICORT) 160-4.5 MCG/ACT inhaler Inhale 2 puffs into the lungs 2 (two) times daily. 30.6 g 1  . CLOBETASOL PROPIONATE E 0.05 % emollient cream Apply 1 application topically 2 (two) times daily as needed (rash).     Marland Kitchen desonide (DESOWEN) 0.05 % ointment Apply 1 application topically 2 (two) times daily as needed. 180 g 1  . dextromethorphan-guaiFENesin (MUCINEX DM) 30-600 MG 12hr tablet Take 1 tablet by mouth 2 (two) times daily. 20 tablet 0  . diltiazem (CARDIZEM CD) 180 MG 24 hr capsule Take 180 mg by mouth daily.     Marland Kitchen doxycycline (VIBRA-TABS) 100 MG tablet     . EPINEPHrine (EPIPEN 2-PAK) 0.3 mg/0.3 mL IJ SOAJ injection Inject 0.3 mLs (0.3 mg total) into the muscle as needed. 2 each 2  . famotidine (PEPCID)  20 MG tablet TAKE 1 TABLET BY MOUTH  DAILY AFTER SUPPER. 90 tablet 3  . fexofenadine (ALLEGRA) 180 MG tablet Take 1 tablet (180 mg total) by mouth daily. 90 tablet 1  . fluticasone (FLONASE) 50 MCG/ACT nasal spray Place 2 sprays into both nostrils daily. 48 g 1  . hydrochlorothiazide (HYDRODIURIL) 25 MG tablet Take 25 mg by mouth daily.     Marland Kitchen HYDROcodone-acetaminophen (NORCO/VICODIN) 5-325 MG tablet Take 1 tablet by mouth every 4 (four) hours as needed for moderate pain. 20 tablet 0  . hydrOXYzine (ATARAX/VISTARIL) 25 MG tablet TAKE 1 TABLET(25 MG) BY MOUTH EVERY 6 HOURS AS NEEDED 270 tablet 1  . Mepolizumab (NUCALA) 100 MG/ML SOAJ Inject 100 mg into the skin every 30 (thirty) days.    . metFORMIN (GLUCOPHAGE-XR) 500 MG 24 hr tablet Take 500 mg by mouth 2 (two) times daily with a meal.     . methylPREDNISolone (MEDROL DOSEPAK) 4 MG TBPK tablet     . mirtazapine (REMERON) 30 MG  tablet TAKE 1 TABLET(30 MG) BY MOUTH AT BEDTIME 90 tablet 1  . montelukast (SINGULAIR) 10 MG tablet Take 1 tablet (10 mg total) by mouth at bedtime. 90 tablet 1  . mupirocin ointment (BACTROBAN) 2 % APPLY NASALLY 2 TIMES DAILY FOR 5 DAYS PRIOR TO SURGERY    . Olopatadine HCl (PAZEO) 0.7 % SOLN Apply 1 drop to eye daily. 22.5 mL 1  . ondansetron (ZOFRAN ODT) 4 MG disintegrating tablet Take 1 tablet (4 mg total) by mouth every 8 (eight) hours as needed for nausea or vomiting. 20 tablet 0  . Oxcarbazepine (TRILEPTAL) 300 MG tablet Take 2 tablets (600 mg total) by mouth at bedtime. 180 tablet 1  . oxybutynin (DITROPAN-XL) 10 MG 24 hr tablet Take 10 mg by mouth daily.     Marland Kitchen oxyCODONE-acetaminophen (PERCOCET) 10-325 MG tablet Take 1 tablet by mouth every 8 (eight) hours as needed for pain.     Marland Kitchen OZEMPIC, 0.25 OR 0.5 MG/DOSE, 2 MG/1.5ML SOPN Inject 0.5 mg into the skin every 7 (seven) days.     . pantoprazole (PROTONIX) 40 MG tablet TAKE 1 TABLET BY MOUTH  DAILY. TAKE 30 TO 60  MINUTES BEFORE FIRST MEAL  OF THE DAY 90 tablet 3  .  potassium chloride SA (K-DUR) 20 MEQ tablet Take 20 mEq by mouth daily.    . pravastatin (PRAVACHOL) 20 MG tablet Take 20 mg by mouth at bedtime.     . pregabalin (LYRICA) 100 MG capsule Take 100 mg by mouth 3 (three) times daily.    Marland Kitchen Respiratory Therapy Supplies (NEBULIZER) DEVI Use as directed with nebulizer solution. 1 each 1  . Respiratory Therapy Supplies (NEBULIZER/TUBING/MOUTHPIECE) KIT Use as directed with nebulizer machine 1 kit 12  . tiZANidine (ZANAFLEX) 2 MG tablet Take 1 tablet (2 mg total) by mouth every 6 (six) hours as needed for muscle spasms. 30 tablet 0  . zolpidem (AMBIEN) 10 MG tablet Take 0.5-1 tablets (5-10 mg total) by mouth at bedtime as needed for sleep. 90 tablet 0  . busPIRone (BUSPAR) 15 MG tablet Take 1.5 tablets (22.5 mg total) by mouth 2 (two) times daily. 270 tablet 0  . penicillin v potassium (VEETID) 500 MG tablet Take 1 tablet (500 mg total) by mouth 3 (three) times daily. 30 tablet 0   No current facility-administered medications for this visit.   Allergies: Allergies  Allergen Reactions  . Carvedilol Cough  . Cymbalta [Duloxetine Hcl] Other (See Comments)    Caused hair loss  . Fish Allergy Itching    Halibut fish  . Morphine And Related Itching  . Rosuvastatin Calcium Other (See Comments) and Cough    Rapid heart rate  . Almond Meal Itching and Swelling    Tongue swells and itches  . Almond Oil Itching and Swelling    Itching and swelling of tongue   . Losartan Potassium Cough   I reviewed her past medical history, social history, family history, and environmental history and no significant changes have been reported from her previous visit.  Review of Systems  Constitutional: Positive for appetite change. Negative for chills, fever and unexpected weight change.  HENT: Positive for congestion, ear pain and sore throat. Negative for rhinorrhea.   Eyes: Negative for itching.  Respiratory: Positive for cough. Negative for chest tightness,  shortness of breath and wheezing.   Gastrointestinal: Negative for abdominal pain.  Skin: Negative for rash.  Neurological: Positive for headaches.   Objective: Physical Exam Not obtained as encounter was  done via telephone.  Speaking in full sentences without any coughing or shortness of breath noted.   Previous notes and tests were reviewed.  I discussed the assessment and treatment plan with the patient. The patient was provided an opportunity to ask questions and all were answered. The patient agreed with the plan and demonstrated an understanding of the instructions. After visit summary/patient instructions available via mychart.   The patient was advised to call back or seek an in-person evaluation if the symptoms worsen or if the condition fails to improve as anticipated.  I provided 33 minutes of non-face-to-face time during this encounter.  It was my pleasure to participate in [place] care today. Please feel free to contact me with any questions or concerns.   Sincerely,  Rexene Alberts, DO Allergy & Immunology  [facility] office: [phone] [facility] office: [place] office: [phone]

## 2020-01-13 NOTE — Patient Instructions (Addendum)
Start penicillin 500mg  three times a day for 10 days. Take some over the counter Cepacol or sore throat spray. Gargle with salt water. If symptoms not improving within the next few days or worsen or develop fevers, you will most likely need to be seen in person.   Asthma: . Daily controller medication(s): continue Symbicort 160 2 puffs twice a day with spacer and rinse mouth afterwards. . Prior to physical activity: May use albuterol rescue inhaler 2 puffs 5 to 15 minutes prior to strenuous physical activities. Rescue medications: May use albuterol rescue inhaler 2 puffs or nebulizer every 4 to 6 hours as needed for shortness of breath, chest tightness, coughing, and wheezing. Monitor frequency of use.  . Asthma control goals:  o Full participation in all desired activities (may need albuterol before activity) o Albuterol use two times or less a week on average (not counting use with activity) o Cough interfering with sleep two times or less a month o Oral steroids no more than once a year o No hospitalizations  Allergic rhinitis: - Continue Flonase 2 sprays daily as needed.  Use flonase for 1-2 weeks at a time before stopping once symptoms improve. - Continue Astelin nasal spray 2 sprays in each nostril twice a day as needed for nasal drainage/post-nasal drip - Continue saline nasal rinses once a day for nasal symptoms. Use before nasal sprays - Continue Allegra 180 mg daily use (may take additional dose if needed to control allergy symptoms) - Continue Singulair 10 mg at bedtime - Continue Pazeo eyedrop 1 drop in each as needed for watery, itchy, red eyes - Continue allergen avoidance measures  Food allergy: - Continue to avoid almonds. - Carry EpiPen at all times.  Follow up in 1 month as scheduled with Dr. Marland Kitchen.

## 2020-01-13 NOTE — Assessment & Plan Note (Signed)
-   Continue to avoid almonds. - Carry EpiPen at all times.

## 2020-01-13 NOTE — Telephone Encounter (Signed)
Patient seen PCP via Televisit on last week for a sick visit. Patient was having swelling and sore throat on the left side. She was given 500 MG of amoxicillin.  Patient seen ENT on Thursday and the ENT couldn't find anything wrong.  Patient states the antibiotic has wore off. Patient is congested, light yellow. Patient states now the right side is hurting and swelling. She is also having a lot of sinus drainage.   No Fevers, No SOB   [facility] on [address]

## 2020-01-14 ENCOUNTER — Ambulatory Visit (INDEPENDENT_AMBULATORY_CARE_PROVIDER_SITE_OTHER): Payer: 59 | Admitting: Psychiatry

## 2020-01-14 DIAGNOSIS — F419 Anxiety disorder, unspecified: Secondary | ICD-10-CM | POA: Diagnosis not present

## 2020-01-14 NOTE — Progress Notes (Signed)
[facility] Counselor/Therapist Progress Note  Patient ID: Chelsea Benson, MRN: [id number],    Date: 01/14/2020  Time Spent: 60 minutes   8:00am to 9:00am  Treatment Type: Individual Therapy  Reported Symptoms: anxiety, frustrated, has been physically sick (congestion, ear infection, vertigo, treated by her Dr.)  Mental Status Exam:  Appearance:   n/a   telehealth     Behavior:  Sharing  Motor:  n/a  telehealth  Speech/Language:   Normal Rate  Affect:  n/a   telehealth  Mood:  anxious, "tired of this Covid thing", frustrated  Thought process:  goal directed  Thought content:    WNL  Sensory/Perceptual disturbances:    WNL  Orientation:  oriented to person, place, time/date, situation, day of week, month of year and year  Attention:  Good  Concentration:  Good  Memory:  WNL, "not much but my mind sometimes wanders a bit but not bad"  Fund of knowledge:   Good  Insight:    Good  Judgment:   Good  Impulse Control:  Good   Risk Assessment: Danger to Self:  No Self-injurious Behavior: No Danger to Others: No Duty to Warn:no Physical Aggression / Violence:No  Access to Firearms a concern: No  Gang Involvement:No   Subjective:  Patient today reports anxiety, frustration, and "really tired of the Covid" situation and having to be apart from people. States she did have "small family group over for the [place] this past week".  She herself has been sick over the past 1-2 week with what she described as congestion and ear infection and some vertigo. States Dr gave her antibiotics and ordered for her to have some PT. Still looking for her own place to live [place]ought [address].  *Hoping to get her Covid vaccine scheduled this week, as her daughter is making the contacts for her.  Interventions: Cognitive Behavioral Therapy and Solution-Oriented/Positive Psychology  Diagnosis:   ICD-10-CM   1. Anxiety disorder, unspecified type  F41.9     Plan: Patient not signing tx  goals on computer screen due to COVID.  Treatment Goals: Goals remain on tx plan as patient works on strategies to meet her goals. Progress will be documented each visit in "Progress" section on Plan.   Long term goal: Reduce overall level, frequency, and intensity of the anxiety so that daily functioning is not impaired.   Short term goal: Increase understanding of beliefs and messages that produce the worry and anxiety.  Strategy: Patient will explore and work to stop cognitive messages that feed anxiety and work to replace them with more positive and empowering messages.   PROGRESS: Patient remains goal-focused and today shares that she's been sick within past 2 weeks with some congestion, vertigo, and ear infection. Reports she is doing much better now.  Has fallen 1 time at home since past couple weeks and states she was bruised on her knee but ok otherwise and better now.  States that she is intentional in being more careful moving about now. Did call her Dr afterwards to check in. She reports her mood is some better today and glad that she is not having any depression right now.  Still trying to decrease the intensity and frequency of her anxiety, states "sometimes I do better" and sometimes she's not as aware of her anxiety in the moment, but "I'm still oing some better overall."  Sleep is good. Reviewed some of the triggers for her anxiety and she is working  to recognize them more quickly to prevent her anxiety from peaking.  She also shares that the weather can impact her anxiety as "I get anxious rather than depressed with gloomy or bad weather." Also worked on more awareness of anxious thoughts and how she can interrupt them and replace with more positive, reality-based, and empowering thoughts that do not support anxiety.  This is challenging for patient but she reports she is making the effort. Denies any fearful thoughts.  Goal review and progress noted with patient.    Next  appt within 2-3 weeks.    Shanon Ace, LCSW

## 2020-01-15 ENCOUNTER — Other Ambulatory Visit: Payer: Self-pay | Admitting: Psychiatry

## 2020-01-15 DIAGNOSIS — F3181 Bipolar II disorder: Secondary | ICD-10-CM

## 2020-01-15 DIAGNOSIS — F5101 Primary insomnia: Secondary | ICD-10-CM

## 2020-01-21 NOTE — Progress Notes (Signed)
This encounter was created in error - please disregard.

## 2020-01-22 ENCOUNTER — Ambulatory Visit (INDEPENDENT_AMBULATORY_CARE_PROVIDER_SITE_OTHER): Payer: 59 | Admitting: Psychiatry

## 2020-01-22 ENCOUNTER — Encounter: Payer: Self-pay | Admitting: Psychiatry

## 2020-01-22 DIAGNOSIS — F419 Anxiety disorder, unspecified: Secondary | ICD-10-CM | POA: Diagnosis not present

## 2020-01-22 DIAGNOSIS — F5101 Primary insomnia: Secondary | ICD-10-CM

## 2020-01-22 DIAGNOSIS — F3181 Bipolar II disorder: Secondary | ICD-10-CM | POA: Diagnosis not present

## 2020-01-22 MED ORDER — OXCARBAZEPINE 300 MG PO TABS: 600.0000 mg | ORAL_TABLET | Freq: Every day | ORAL | 1 refills | Status: DC

## 2020-01-22 MED ORDER — ZOLPIDEM TARTRATE 10 MG PO TABS: 5.0000 mg | ORAL_TABLET | Freq: Every evening | ORAL | 0 refills | Status: DC | PRN

## 2020-01-22 MED ORDER — BUSPIRONE HCL 15 MG PO TABS: 22.5000 mg | ORAL_TABLET | Freq: Two times a day (BID) | ORAL | 0 refills | Status: DC

## 2020-01-22 MED ORDER — ARIPIPRAZOLE 2 MG PO TABS: 2.0000 mg | ORAL_TABLET | Freq: Every day | ORAL | 1 refills | Status: DC

## 2020-01-22 NOTE — Progress Notes (Signed)
Chelsea Benson [id number] 03-Dec-1951 69 y.o.  Virtual Visit via Telephone Note  I connected with pt on 01/22/20 at  8:30 AM EST by telephone and verified that I am speaking with the correct person using two identifiers.   I discussed the limitations, risks, security and privacy concerns of performing an evaluation and management service by telephone and the availability of in person appointments. I also discussed with the patient that there may be a patient responsible charge related to this service. The patient expressed understanding and agreed to proceed.   I discussed the assessment and treatment plan with the patient. The patient was provided an opportunity to ask questions and all were answered. The patient agreed with the plan and demonstrated an understanding of the instructions.   The patient was advised to call back or seek an in-person evaluation if the symptoms worsen or if the condition fails to improve as anticipated.  I provided 25 minutes of non-face-to-face time during this encounter.  The patient was located at home.  The provider was located at [place].   Thayer Headings, PMHNP   Subjective:   Patient ID:  Chelsea Benson is a 69 y.o. (DOB 1951-06-23) female.  Chief Complaint:  Chief Complaint  Patient presents with  . Follow-up    h/o anxiety, depression, and sleep disturbance    HPI Chelsea Benson presents for follow-up of anxiety, depression, and insomnia. She reports that she had some anxiety re: not being able to get a vaccination and then had some anxiety once she was able to schedule it she has had anxiety about possible side effects. She reports that she has been gathering info by contacting PCP and talking with family members about their experiences. She reports that she has had some increased generalized anxiety about the shot and denies anxiety re: anything else. She denies any recent panic attacks and felt the start of panic s/s  recently that was redirectable. Denies depressed mood or irritability. She reports adequate sleep and averaging about 8 hours a night. She reports that her appetite has been decreased and that weight has been fluctuating. She reports adequate energy and motivation. Concentration is adequate. Denies SI.   She reports that she reports that some nights she falls asleep without Ambien.  Review of Systems:  Review of Systems  HENT: Positive for sinus pressure and sinus pain.   Musculoskeletal: Positive for back pain.       Hip pain  Neurological: Negative for tremors.       Reports neuropathy in feet. Denies involuntary movements.   Psychiatric/Behavioral:       Please refer to HPI    Medications: I have reviewed the patient's current medications.  Current Outpatient Medications  Medication Sig Dispense Refill  . albuterol (PROVENTIL) (2.5 MG/3ML) 0.083% nebulizer solution Take 3 mLs (2.5 mg total) by nebulization every 4 (four) hours as needed for wheezing or shortness of breath. 375 mL 1  . albuterol (VENTOLIN HFA) 108 (90 Base) MCG/ACT inhaler Inhale 2 puffs into the lungs every 4 (four) hours as needed. 36 g 1  . ARIPiprazole (ABILIFY) 2 MG tablet Take 1 tablet (2 mg total) by mouth daily. 90 tablet 1  . aspirin EC 81 MG tablet Take 81 mg by mouth daily.    Marland Kitchen azelastine (ASTELIN) 0.1 % nasal spray Place 1 spray into both nostrils 2 (two) times daily. 90 mL 1  . Bepotastine Besilate (BEPREVE) 1.5 % SOLN 1 drop in each eye twice  a day. 30 mL 1  . budesonide-formoterol (SYMBICORT) 160-4.5 MCG/ACT inhaler Inhale 2 puffs into the lungs 2 (two) times daily. 30.6 g 1  . diltiazem (CARDIZEM CD) 180 MG 24 hr capsule Take 180 mg by mouth daily.     . famotidine (PEPCID) 20 MG tablet TAKE 1 TABLET BY MOUTH  DAILY AFTER SUPPER. 90 tablet 3  . fluticasone (FLONASE) 50 MCG/ACT nasal spray Place 2 sprays into both nostrils daily. 48 g 1  . hydrochlorothiazide (HYDRODIURIL) 25 MG tablet Take 25 mg by  mouth daily.     . hydrOXYzine (ATARAX/VISTARIL) 25 MG tablet TAKE 1 TABLET(25 MG) BY MOUTH EVERY 6 HOURS AS NEEDED 270 tablet 1  . Mepolizumab (NUCALA) 100 MG/ML SOAJ Inject 100 mg into the skin every 30 (thirty) days.    . metFORMIN (GLUCOPHAGE-XR) 500 MG 24 hr tablet Take 500 mg by mouth 2 (two) times daily with a meal.     . mirtazapine (REMERON) 30 MG tablet TAKE 1 TABLET BY MOUTH AT  BEDTIME 90 tablet 3  . montelukast (SINGULAIR) 10 MG tablet Take 1 tablet (10 mg total) by mouth at bedtime. 90 tablet 1  . ondansetron (ZOFRAN ODT) 4 MG disintegrating tablet Take 1 tablet (4 mg total) by mouth every 8 (eight) hours as needed for nausea or vomiting. 20 tablet 0  . Oxcarbazepine (TRILEPTAL) 300 MG tablet Take 2 tablets (600 mg total) by mouth at bedtime. 180 tablet 1  . oxybutynin (DITROPAN-XL) 10 MG 24 hr tablet Take 10 mg by mouth daily.     Marland Kitchen OZEMPIC, 0.25 OR 0.5 MG/DOSE, 2 MG/1.5ML SOPN Inject 0.5 mg into the skin every 7 (seven) days.     . pantoprazole (PROTONIX) 40 MG tablet TAKE 1 TABLET BY MOUTH  DAILY. TAKE 30 TO 60  MINUTES BEFORE FIRST MEAL  OF THE DAY 90 tablet 3  . penicillin v potassium (VEETID) 500 MG tablet Take 1 tablet (500 mg total) by mouth 3 (three) times daily. 30 tablet 0  . potassium chloride SA (K-DUR) 20 MEQ tablet Take 20 mEq by mouth daily.    . pravastatin (PRAVACHOL) 20 MG tablet Take 20 mg by mouth at bedtime.     . pregabalin (LYRICA) 100 MG capsule Take 100 mg by mouth 3 (three) times daily.    Marland Kitchen tiZANidine (ZANAFLEX) 2 MG tablet Take 1 tablet (2 mg total) by mouth every 6 (six) hours as needed for muscle spasms. 30 tablet 0  . zolpidem (AMBIEN) 10 MG tablet Take 0.5-1 tablets (5-10 mg total) by mouth at bedtime as needed for sleep. 90 tablet 0  . ACCU-CHEK GUIDE test strip TEST BID PRN  1  . Blood Glucose Monitoring Suppl (ACCU-CHEK GUIDE) w/Device KIT See admin instructions.  1  . busPIRone (BUSPAR) 15 MG tablet Take 1.5 tablets (22.5 mg total) by mouth 2  (two) times daily. 270 tablet 0  . CLOBETASOL PROPIONATE E 0.05 % emollient cream Apply 1 application topically 2 (two) times daily as needed (rash).     Marland Kitchen desonide (DESOWEN) 0.05 % ointment Apply 1 application topically 2 (two) times daily as needed. 180 g 1  . dextromethorphan-guaiFENesin (MUCINEX DM) 30-600 MG 12hr tablet Take 1 tablet by mouth 2 (two) times daily. (Patient not taking: Reported on 01/22/2020) 20 tablet 0  . EPINEPHrine (EPIPEN 2-PAK) 0.3 mg/0.3 mL IJ SOAJ injection Inject 0.3 mLs (0.3 mg total) into the muscle as needed. 2 each 2  . fexofenadine (ALLEGRA) 180 MG tablet Take  1 tablet (180 mg total) by mouth daily. 90 tablet 1  . HYDROcodone-acetaminophen (NORCO/VICODIN) 5-325 MG tablet Take 1 tablet by mouth every 4 (four) hours as needed for moderate pain. (Patient not taking: Reported on 01/22/2020) 20 tablet 0  . mupirocin ointment (BACTROBAN) 2 % APPLY NASALLY 2 TIMES DAILY FOR 5 DAYS PRIOR TO SURGERY    . Olopatadine HCl (PAZEO) 0.7 % SOLN Apply 1 drop to eye daily. (Patient not taking: Reported on 01/22/2020) 22.5 mL 1  . Respiratory Therapy Supplies (NEBULIZER) DEVI Use as directed with nebulizer solution. 1 each 1  . Respiratory Therapy Supplies (NEBULIZER/TUBING/MOUTHPIECE) KIT Use as directed with nebulizer machine 1 kit 12   No current facility-administered medications for this visit.    Medication Side Effects: None  Allergies:  Allergies  Allergen Reactions  . Carvedilol Cough  . Cymbalta [Duloxetine Hcl] Other (See Comments)    Caused hair loss  . Fish Allergy Itching    Halibut fish  . Morphine And Related Itching  . Rosuvastatin Calcium Other (See Comments) and Cough    Rapid heart rate  . Almond Meal Itching and Swelling    Tongue swells and itches  . Almond Oil Itching and Swelling    Itching and swelling of tongue   . Losartan Potassium Cough    Past Medical History:  Diagnosis Date  . Allergic rhinoconjunctivitis   . Allergy history unknown    . Anxiety   . Arthritis    DDD, spondylosis  . Asthma   . Bipolar disorder ([place])   . Chronic back pain    Has had several surgeries.  He uses a walker  . Chronic kidney disease    renal calculi  . Depression   . Diabetes mellitus without complication ([place])    dx in 2007  . Diabetes mellitus, type II ([place])   . Eczema   . Headache(784.0)    sinus related   . History of kidney stones   . Hx of blood clots    lungs  . Hx of pulmonary embolus 2017  . Hyperlipidemia   . Hypertension   . Insomnia   . Knee pain   . Left ankle pain   . Memory change   . Mental disorder   . Mood disorder ([place])   . OSA on CPAP    PSG 05/24/2016: AHI 17/hour 02 mean 76%.  HSAT 06/13/17, AHI 11-hour 02 mean 79%  . Recurrent upper respiratory infection (URI)   . Sleep apnea   . Tremor of both hands     Family History  Problem Relation Age of Onset  . Diabetes Sister   . Diabetes Brother   . Hypertension Brother   . Cancer Maternal Grandmother   . Heart failure Maternal Grandmother   . Hypertension Paternal Grandmother   . Asthma Daughter   . Cancer Daughter   . Depression Daughter   . Hypertension Daughter   . Heart failure Maternal Aunt   . Pneumonia Mother   . Diabetes Mother   . Alcoholism Father   . Alcohol abuse Father   . Depression Daughter   . Schizophrenia Grandchild   . Allergies Neg Hx   . Eczema Neg Hx   . Immunodeficiency Neg Hx     Social History   Socioeconomic History  . Marital status: Single    Spouse name: Not on file  . Number of children: 2  . Years of education: Not on file  . Highest education level: Some  college, no degree  Occupational History    Comment: NA  Tobacco Use  . Smoking status: Passive Smoke Exposure - Never Smoker  . Smokeless tobacco: Never Used  . Tobacco comment: grandson smokes, but in his car  Substance and Sexual Activity  . Alcohol use: Yes    Comment: rare use  . Drug use: No  . Sexual activity: Not Currently  Other Topics  Concern  . Not on file  Social History Narrative   She is currently single.  Has 2 girls.  Has some college education.  Currently disabled due to chronic back pain and not employed.   07/01/19 lives with brother Shanon Brow   Caffeine, none   Social Determinants of Health   Financial Resource Strain:   . Difficulty of Paying Living Expenses: Not on file  Food Insecurity:   . Worried About Charity fundraiser in the Last Year: Not on file  . Ran Out of Food in the Last Year: Not on file  Transportation Needs:   . Lack of Transportation (Medical): Not on file  . Lack of Transportation (Non-Medical): Not on file  Physical Activity:   . Days of Exercise per Week: Not on file  . Minutes of Exercise per Session: Not on file  Stress:   . Feeling of Stress : Not on file  Social Connections:   . Frequency of Communication with Friends and Family: Not on file  . Frequency of Social Gatherings with Friends and Family: Not on file  . Attends Religious Services: Not on file  . Active Member of Clubs or Organizations: Not on file  . Attends Archivist Meetings: Not on file  . Marital Status: Not on file  Intimate Partner Violence:   . Fear of Current or Ex-Partner: Not on file  . Emotionally Abused: Not on file  . Physically Abused: Not on file  . Sexually Abused: Not on file    Past Medical History, Surgical history, Social history, and Family history were reviewed and updated as appropriate.   Please see review of systems for further details on the patient's review from today.   Objective:   Physical Exam:  Wt 174 lb (78.9 kg)   BMI 29.87 kg/m   Physical Exam Neurological:     Mental Status: She is alert and oriented to person, place, and time.     Cranial Nerves: No dysarthria.  Psychiatric:        Attention and Perception: Attention and perception normal.        Mood and Affect: Mood is not depressed.        Speech: Speech normal.        Behavior: Behavior is  cooperative.        Thought Content: Thought content normal. Thought content is not paranoid or delusional. Thought content does not include homicidal or suicidal ideation. Thought content does not include homicidal or suicidal plan.        Cognition and Memory: Cognition and memory normal.        Judgment: Judgment normal.     Comments: Insight intact Somewhat anxious regarding Covid vaccination and risk of Covid.     Lab Review:     Component Value Date/Time   NA 141 08/14/2019 0859   NA 134 (A) 05/23/2017 0000   K 3.0 (L) 08/14/2019 0859   CL 103 08/14/2019 0859   CO2 28 08/14/2019 0859   GLUCOSE 120 (H) 08/14/2019 0859   BUN 17 08/14/2019 0859  BUN 14 05/23/2017 0000   CREATININE 0.81 08/14/2019 0859   CALCIUM 9.8 08/14/2019 0859   PROT 7.3 04/28/2019 1712   ALBUMIN 4.2 04/28/2019 1712   AST 21 04/28/2019 1712   ALT 30 04/28/2019 1712   ALKPHOS 68 04/28/2019 1712   BILITOT 0.5 04/28/2019 1712   GFRNONAA >60 08/14/2019 0859   GFRAA >60 08/14/2019 0859       Component Value Date/Time   WBC 4.8 08/14/2019 0859   RBC 4.86 08/14/2019 0859   HGB 12.8 08/14/2019 0859   HGB 12.5 11/08/2018 1041   HCT 41.9 08/14/2019 0859   HCT 39.5 11/08/2018 1041   PLT 289 08/14/2019 0859   MCV 86.2 08/14/2019 0859   MCV 81 11/08/2018 1041   MCH 26.3 08/14/2019 0859   MCHC 30.5 08/14/2019 0859   RDW 14.9 08/14/2019 0859   RDW 14.3 11/08/2018 1041   LYMPHSABS 2.1 04/28/2019 1712   LYMPHSABS 1.4 11/08/2018 1041   MONOABS 0.5 04/28/2019 1712   EOSABS 0.3 04/28/2019 1712   EOSABS 0.2 11/08/2018 1041   BASOSABS 0.0 04/28/2019 1712   BASOSABS 0.0 11/08/2018 1041    No results found for: POCLITH, LITHIUM   No results found for: PHENYTOIN, PHENOBARB, VALPROATE, CBMZ   .res Assessment: Plan:   Patient reports that overall her mood and sliding signs and symptoms have been well controlled with current medication, aside from occasional situational anxiety which she describes as  manageable.  She denies any current tolerability issues. Continue Abilify 2 mg daily for mood signs and symptoms Continue Trileptal 600 mg at bedtime for mood stabilization, anxiety, insomnia. Continue BuSpar 22.5 mg twice daily for anxiety. Continue Ambien as needed for insomnia. Recommend continuing psychotherapy with Rinaldo Cloud, LCSW. Patient to follow-up with this provider in 3 months or sooner if clinically indicated. Patient advised to contact office with any questions, adverse effects, or acute worsening in signs and symptoms.  Symantha was seen today for follow-up.  Diagnoses and all orders for this visit:  Bipolar II disorder ([place]) -     ARIPiprazole (ABILIFY) 2 MG tablet; Take 1 tablet (2 mg total) by mouth daily.  Anxiety disorder, unspecified type -     busPIRone (BUSPAR) 15 MG tablet; Take 1.5 tablets (22.5 mg total) by mouth 2 (two) times daily. -     Oxcarbazepine (TRILEPTAL) 300 MG tablet; Take 2 tablets (600 mg total) by mouth at bedtime.  Primary insomnia -     zolpidem (AMBIEN) 10 MG tablet; Take 0.5-1 tablets (5-10 mg total) by mouth at bedtime as needed for sleep.    Please see After Visit Summary for patient specific instructions.  Future Appointments  Date Time Provider [place]  01/28/2020  8:00 AM Shanon Ace, LCSW [facility] None  02/05/2020 10:20 AM Nelva Bush, Rae Halsted, MD [facility] None  02/06/2020  1:15 PM Rozetta Nunnery, MD [facility] None  02/11/2020 10:00 AM Shanon Ace, LCSW [facility] None  02/20/2020  8:20 AM Leonie Man, MD [facility]  02/25/2020 10:00 AM Shanon Ace, LCSW [facility] None  03/10/2020 10:00 AM Shanon Ace, LCSW [facility] None  03/24/2020 10:00 AM Shanon Ace, LCSW [facility] None    No orders of the defined types were placed in this encounter.     -------------------------------

## 2020-01-23 ENCOUNTER — Other Ambulatory Visit: Payer: Self-pay | Admitting: Allergy

## 2020-01-23 DIAGNOSIS — J4540 Moderate persistent asthma, uncomplicated: Secondary | ICD-10-CM

## 2020-01-24 ENCOUNTER — Other Ambulatory Visit: Payer: Self-pay | Admitting: Allergy

## 2020-01-28 ENCOUNTER — Telehealth (INDEPENDENT_AMBULATORY_CARE_PROVIDER_SITE_OTHER): Payer: Self-pay

## 2020-01-28 ENCOUNTER — Ambulatory Visit (INDEPENDENT_AMBULATORY_CARE_PROVIDER_SITE_OTHER): Payer: 59 | Admitting: Psychiatry

## 2020-01-28 DIAGNOSIS — F419 Anxiety disorder, unspecified: Secondary | ICD-10-CM

## 2020-01-28 NOTE — Progress Notes (Signed)
[facility] Counselor/Therapist Progress Note  Patient ID: Chelsea Benson, MRN: [id number],    Date: 01/28/2020  Time Spent: 60 minutes  7:55am to 8:55am  Virtual Visit Note Connected with patient by a video enabled telemedicine/telehealth application or telephone, with their informed consent, and verified patient privacy and that I am speaking with the correct person using two identifiers. I discussed the limitations, risks, security and privacy concerns of performing psychotherapy and management service by telephone and the availability of in person appointments. I also discussed with the patient that there may be a patient responsible charge related to this service. The patient expressed understanding and agreed to proceed. I discussed the treatment planning with the patient. The patient was provided an opportunity to ask questions and all were answered. The patient agreed with the plan and demonstrated an understanding of the instructions. The patient was advised to call  our office if  symptoms worsen or feel they are in a crisis state and need immediate contact.   Therapist Location: [facility] Patient Location: home   Treatment Type: Individual Therapy  Reported Symptoms: anxiety, depression (some better)  Mental Status Exam:  Appearance:   n/a  telehealth     Behavior:  Sharing and Motivated  Motor:  n/a  telehealth  Speech/Language:   Normal Rate  Affect:  n/a  telehealth  Mood:  anxious  Thought process:  goal directed  Thought content:    WNL  Sensory/Perceptual disturbances:    WNL  Orientation:  oriented to person  Attention:  Good  Concentration:  Good  Memory:  WNL  Fund of knowledge:   Good  Insight:    Good  Judgment:   Good  Impulse Control:  Good   Risk Assessment: Danger to Self:  No Self-injurious Behavior: No Danger to Others: No Duty to Warn:no Physical Aggression / Violence:No  Access to Firearms a concern: No  Gang  Involvement:No   Subjective:  Patient today reports anxiety an depression. Depression is some better.  Anxiety is more elevated today as she is very anxious getting her first Covid vaccine today.  Worried about having side effects as she has been hearing lots of different reports from different people. Has also spoken with her doctor's office and got reassurance on what to do "if I have side effects."  Interventions: Solution-Oriented/Positive Psychology  Diagnosis:   ICD-10-CM   1. Anxiety disorder, unspecified type  F41.9     Plan: Patient not signing tx goals on computer screen due to [place].  Treatment Goals: Goals remain on tx plan as patient works on strategies to meet her goals. Progress will be documented each visit in "Progress" section on Plan.   Long term goal: Reduce overall level, frequency, and intensity of the anxiety so that daily functioning is not impaired.   Short term goal: Increase understanding of beliefs and messages that produce the worry and anxiety.  Strategy: Patient will explore and work to stop cognitive messages that feed anxiety and work to replace them with more positive and empowering messages.   PROGRESS: Patient is progressing but is very anxious today and is mostly focused on her getting the first Covid vaccine.  Talking almost non-stop initially all about the vaccine and it's safety or lack of safety.  States she has spoken with a number of people, all of whom give their opinions/suggestions to patient.  Also called her PCP's office and was given suggestions to help with any side effects in the  event patient has any. She was able to calm some and focus on some family issues that are also feeding her anxiety and stress.  After some discussion of these, we used an example of her anxious thoughts in reference to family issues and in reference to her anxiety about the vaccine, and worked on changing those thoughts to be more positive, encouraging,  and reality-based thoughts that do not lead to anxiety and worry.  Patient able to work with therapist and do this in session, however more challenging for her to identify the thoughts and change them on her own at home.  More grounded and positive by session end.  [place] review and progress/challenges noted with patient.    Next appt within 2-3 weeks.   Mathis Fare, LCSW

## 2020-02-05 ENCOUNTER — Telehealth: Payer: Self-pay | Admitting: *Deleted

## 2020-02-05 ENCOUNTER — Other Ambulatory Visit: Payer: Self-pay

## 2020-02-05 ENCOUNTER — Ambulatory Visit (INDEPENDENT_AMBULATORY_CARE_PROVIDER_SITE_OTHER): Payer: 59 | Admitting: Allergy

## 2020-02-05 ENCOUNTER — Encounter: Payer: Self-pay | Admitting: Allergy

## 2020-02-05 DIAGNOSIS — J4540 Moderate persistent asthma, uncomplicated: Secondary | ICD-10-CM

## 2020-02-05 DIAGNOSIS — H1013 Acute atopic conjunctivitis, bilateral: Secondary | ICD-10-CM

## 2020-02-05 DIAGNOSIS — J3089 Other allergic rhinitis: Secondary | ICD-10-CM

## 2020-02-05 DIAGNOSIS — T7800XD Anaphylactic reaction due to unspecified food, subsequent encounter: Secondary | ICD-10-CM | POA: Diagnosis not present

## 2020-02-05 MED ORDER — NUCALA 100 MG/ML ~~LOC~~ SOAJ: 100.0000 mg | SUBCUTANEOUS | 11 refills | Status: DC

## 2020-02-05 MED ORDER — LEVOCETIRIZINE DIHYDROCHLORIDE 5 MG PO TABS: 5.0000 mg | ORAL_TABLET | Freq: Every day | ORAL | 5 refills | Status: DC

## 2020-02-05 MED ORDER — AZELASTINE HCL 0.05 % OP SOLN: 1.0000 [drp] | Freq: Two times a day (BID) | OPHTHALMIC | 5 refills | Status: DC

## 2020-02-05 MED ORDER — FLUTICASONE PROPIONATE 50 MCG/ACT NA SUSP: 2.0000 | Freq: Every day | NASAL | 1 refills | Status: DC

## 2020-02-05 NOTE — Progress Notes (Signed)
RE: Chelsea Benson MRN: [id number] DOB: 04-15-1951 Date of Telemedicine Visit: 02/05/2020  Referring provider: Kathyrn Lass, MD Primary care provider: Kathyrn Lass, MD  Chief Complaint: Asthma (no issues) and Allergic Rhinitis  (allegra not working, eyes feels like they have rocks in them)   Telemedicine Follow Up Visit via Telephone: I connected with Chelsea Benson for a follow up on 02/05/20 by telephone and verified that I am speaking with the correct person using two identifiers.   I discussed the limitations, risks, security and privacy concerns of performing an evaluation and management service by telephone and the availability of in person appointments. I also discussed with the patient that there may be a patient responsible charge related to this service. The patient expressed understanding and agreed to proceed.  Patient is at home.  Provider is at the office.  Visit start time: 0100 Visit end time: [place] consent/check in by: Chelsea Benson Medical consent and medical assistant/nurse: Chelsea Benson  History of Present Illness: She is a 69 y.o. female, who is being followed for asthma, allergic rhinitis with conjunctivitis, food allergy. Her previous allergy visit was a televisit on 01/13/2020 with Chelsea Benson for acute pharyngitis.  She was treated with penicillin 500 mg for 10 days.  She states she did improve from this illness. Her main issue today is that her eyes are still bothering her.  She states they feel gritty and like there are rocks in them and it is uncomfortable but not painful.  She also has a lot of watery drainage.  She denies any vision changes with this.  She states the Pataday does not work as well as the [employer] did.  She also does not feel that the reprieve works any better.  She also does not feel that the Delma Freeze is working to manage her symptoms as it used to.  She states she does need a refill of her Flonase.  She also has access to Astelin for nasal drainage use.   She also takes Singulair daily for both allergy and asthma control.  With her asthma control she states she has been doing great.  She does new Calla at home monthly without any issues.  She also takes Symbicort 160 mcg 2 puffs twice a day. She continues to avoid almonds and does have access to her epinephrine device and has not had any accidental  Assessment and Plan: Chelsea Benson is a 69 y.o. female with: Patient Instructions     Allergic rhinitis with conjunctivitis - Continue Flonase 2 sprays daily as needed.  Use flonase for 1-2 weeks at a time before stopping once symptoms improve. - Continue Astelin nasal spray 2 sprays in each nostril twice a day as needed for nasal drainage/post-nasal drip - Continue saline nasal rinses once a day for nasal symptoms. Use before nasal sprays -We will change Allegra to Xyzal 5 mg at this time (may take additional dose if needed to control allergy symptoms) - Continue Singulair 10 mg at bedtime -We will change her eyedrop to Optivar 1 drop in each twice a day as needed for watery, itchy, red eyes - Continue allergen avoidance measures   Moderate persistent asthma -much better control - Continue Symbicort 160 - 2 puffs twice daily - Continue singulair as above  - have access to albuterol inhaler 2 puffs every 4-6 hours as needed for cough/wheeze/shortness of breath/chest tightness.  May use 15-20 minutes prior to activity.   Monitor frequency of use.   -Continue Nucala monthly autoinjector  to allow for ease of home administration.  Daugther is giving her injections once a month.    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 - Carry EpiPen at all times.   Flexural atopic dermatitis - Continue moisturizing routine with Aveeno - Use triamcinolone 0.1% below the face.   Follow up in 3-4  or sooner if needed.    Diagnostics: None.  Medication List:  Current Outpatient Medications  Medication Sig Dispense Refill  . ACCU-CHEK GUIDE test strip  TEST BID PRN  1  . albuterol (PROVENTIL) (2.5 MG/3ML) 0.083% nebulizer solution Take 3 mLs (2.5 mg total) by nebulization every 4 (four) hours as needed for wheezing or shortness of breath. 375 mL 1  . albuterol (VENTOLIN HFA) 108 (90 Base) MCG/ACT inhaler Inhale 2 puffs into the lungs every 4 (four) hours as needed. 36 g 1  . ARIPiprazole (ABILIFY) 2 MG tablet Take 1 tablet (2 mg total) by mouth daily. 90 tablet 1  . aspirin EC 81 MG tablet Take 81 mg by mouth daily.    Marland Kitchen azelastine (ASTELIN) 0.1 % nasal spray Place 1 spray into both nostrils 2 (two) times daily. 90 mL 1  . Blood Glucose Monitoring Suppl (ACCU-CHEK GUIDE) w/Device KIT See admin instructions.  1  . busPIRone (BUSPAR) 15 MG tablet Take 1.5 tablets (22.5 mg total) by mouth 2 (two) times daily. 270 tablet 0  . CLOBETASOL PROPIONATE E 0.05 % emollient cream Apply 1 application topically 2 (two) times daily as needed (rash).     Marland Kitchen desonide (DESOWEN) 0.05 % ointment Apply 1 application topically 2 (two) times daily as needed. 180 g 1  . dextromethorphan-guaiFENesin (MUCINEX DM) 30-600 MG 12hr tablet Take 1 tablet by mouth 2 (two) times daily. 20 tablet 0  . diltiazem (CARDIZEM CD) 180 MG 24 hr capsule Take 180 mg by mouth daily.     Marland Kitchen EPINEPHrine (EPIPEN 2-PAK) 0.3 mg/0.3 mL IJ SOAJ injection Inject 0.3 mLs (0.3 mg total) into the muscle as needed. 2 each 2  . famotidine (PEPCID) 20 MG tablet TAKE 1 TABLET BY MOUTH  DAILY AFTER SUPPER. 90 tablet 3  . fexofenadine (ALLEGRA) 180 MG tablet Take 1 tablet (180 mg total) by mouth daily. 90 tablet 1  . fluticasone (FLONASE) 50 MCG/ACT nasal spray Place 2 sprays into both nostrils daily. 48 g 1  . hydrochlorothiazide (HYDRODIURIL) 25 MG tablet Take 25 mg by mouth daily.     . hydrOXYzine (ATARAX/VISTARIL) 25 MG tablet TAKE 1 TABLET(25 MG) BY MOUTH EVERY 6 HOURS AS NEEDED 270 tablet 1  . Mepolizumab (NUCALA) 100 MG/ML SOAJ Inject 100 mg into the skin every 30 (thirty) days.    . metFORMIN  (GLUCOPHAGE-XR) 500 MG 24 hr tablet Take 500 mg by mouth 2 (two) times daily with a meal.     . mirtazapine (REMERON) 30 MG tablet TAKE 1 TABLET BY MOUTH AT  BEDTIME 90 tablet 3  . montelukast (SINGULAIR) 10 MG tablet TAKE 1 TABLET BY MOUTH AT  BEDTIME 90 tablet 1  . Oxcarbazepine (TRILEPTAL) 300 MG tablet Take 2 tablets (600 mg total) by mouth at bedtime. 180 tablet 1  . oxybutynin (DITROPAN-XL) 10 MG 24 hr tablet Take 10 mg by mouth daily.     Marland Kitchen OZEMPIC, 0.25 OR 0.5 MG/DOSE, 2 MG/1.5ML SOPN Inject 0.5 mg into the skin every 7 (seven) days.     . pantoprazole (PROTONIX) 40 MG tablet TAKE 1 TABLET BY MOUTH  DAILY. TAKE 30 TO 60  MINUTES BEFORE FIRST MEAL  OF THE DAY 90 tablet 3  . potassium chloride SA (K-DUR) 20 MEQ tablet Take 20 mEq by mouth daily.    . pravastatin (PRAVACHOL) 20 MG tablet Take 20 mg by mouth at bedtime.     . pregabalin (LYRICA) 100 MG capsule Take 100 mg by mouth 3 (three) times daily.    Marland Kitchen Respiratory Therapy Supplies (NEBULIZER) DEVI Use as directed with nebulizer solution. 1 each 1  . Respiratory Therapy Supplies (NEBULIZER/TUBING/MOUTHPIECE) KIT Use as directed with nebulizer machine 1 kit 12  . SYMBICORT 160-4.5 MCG/ACT inhaler USE 2 INHALATIONS BY MOUTH  TWICE DAILY 30.6 g 1  . tiZANidine (ZANAFLEX) 2 MG tablet Take 1 tablet (2 mg total) by mouth every 6 (six) hours as needed for muscle spasms. 30 tablet 0  . zolpidem (AMBIEN) 10 MG tablet Take 0.5-1 tablets (5-10 mg total) by mouth at bedtime as needed for sleep. 90 tablet 0  . azelastine (OPTIVAR) 0.05 % ophthalmic solution Place 1 drop into both eyes 2 (two) times daily. 6 mL 5  . levocetirizine (XYZAL) 5 MG tablet Take 1 tablet (5 mg total) by mouth daily. 30 tablet 5  . Mepolizumab (NUCALA) 100 MG/ML SOAJ Inject 100 mg into the skin every 28 (twenty-eight) days. 0.84 mL 11   No current facility-administered medications for this visit.   Allergies: Allergies  Allergen Reactions  . Carvedilol Cough  . Cymbalta  [Duloxetine Hcl] Other (See Comments)    Caused hair loss  . Fish Allergy Itching    Halibut fish  . Morphine And Related Itching  . Rosuvastatin Calcium Other (See Comments) and Cough    Rapid heart rate  . Almond Meal Itching and Swelling    Tongue swells and itches  . Almond Oil Itching and Swelling    Itching and swelling of tongue   . Losartan Potassium Cough   I reviewed her past medical history, social history, family history, and environmental history and no significant changes have been reported from previous visit on 01/13/2020.  Review of Systems  Constitutional: Negative.   HENT: Negative.   Eyes: Positive for discharge. Negative for photophobia, pain, redness and visual disturbance.  Respiratory: Negative.   Cardiovascular: Negative.   Gastrointestinal: Negative.   Musculoskeletal: Negative.   Skin: Negative.   Neurological: Negative.    Objective: Physical Exam Not obtained as encounter was done via telephone.   Previous notes and tests were reviewed.  I discussed the assessment and treatment plan with the patient. The patient was provided an opportunity to ask questions and all were answered. The patient agreed with the plan and demonstrated an understanding of the instructions.   The patient was advised to call back or seek an in-person evaluation if the symptoms worsen or if the condition fails to improve as anticipated.  I provided 19 minutes of non-face-to-face time during this encounter.  It was my pleasure to participate in [place] care today. Please feel free to contact me with any questions or concerns.   Sincerely,  Sandar Krinke Charmian Muff, MD

## 2020-02-05 NOTE — Telephone Encounter (Signed)
Renewal rx to [facility] for [facility] autoinjector sent

## 2020-02-05 NOTE — Telephone Encounter (Signed)
Spoke with patient during her telephone visit and she stated that the pharmacy did not have enough refills for her medication is what I gathered from what she described. She stated that she received a letter in the mail that she is approved coverage until November for her Nucala. Please advise Tammy, thank you.

## 2020-02-05 NOTE — Patient Instructions (Addendum)
    Allergic rhinitis with conjunctivitis - Continue Flonase 2 sprays daily as needed.  Use flonase for 1-2 weeks at a time before stopping once symptoms improve. - Continue Astelin nasal spray 2 sprays in each nostril twice a day as needed for nasal drainage/post-nasal drip - Continue saline nasal rinses once a day for nasal symptoms. Use before nasal sprays -We will change Allegra to Xyzal 5 mg at this time (may take additional dose if needed to control allergy symptoms) - Continue Singulair 10 mg at bedtime -We will change her eyedrop to Optivar 1 drop in each twice a day as needed for watery, itchy, red eyes - Continue allergen avoidance measures   Moderate persistent asthma -much better control - Continue Symbicort 160 - 2 puffs twice daily - Continue singulair as above  - have access to albuterol inhaler 2 puffs every 4-6 hours as needed for cough/wheeze/shortness of breath/chest tightness.  May use 15-20 minutes prior to activity.   Monitor frequency of use.   -Continue Nucala monthly autoinjector to allow for ease of home administration.  Daugther is giving her injections once a month.    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 - Carry EpiPen at all times.   Flexural atopic dermatitis - Continue moisturizing routine with Aveeno - Use triamcinolone 0.1% below the face.     Follow up in 3-4  or sooner if needed.

## 2020-02-06 ENCOUNTER — Ambulatory Visit (INDEPENDENT_AMBULATORY_CARE_PROVIDER_SITE_OTHER): Payer: 59 | Admitting: Otolaryngology

## 2020-02-11 ENCOUNTER — Ambulatory Visit (INDEPENDENT_AMBULATORY_CARE_PROVIDER_SITE_OTHER): Payer: 59 | Admitting: Psychiatry

## 2020-02-11 DIAGNOSIS — F419 Anxiety disorder, unspecified: Secondary | ICD-10-CM

## 2020-02-11 NOTE — Progress Notes (Signed)
[facility] Counselor/Therapist Progress Note  Patient ID: Chelsea Benson, MRN: [id number],    Date: 02/11/2020  Time Spent: 45 minutes  10:00am to 10:45am  Virtual Visit Note Connected with patient by a video enabled telemedicine/telehealth application or telephone, with their informed consent, and verified patient privacy and that I am speaking with the correct person using two identifiers. I discussed the limitations, risks, security and privacy concerns of performing psychotherapy and management service by telephone and the availability of in person appointments. I also discussed with the patient that there may be a patient responsible charge related to this service. The patient expressed understanding and agreed to proceed. I discussed the treatment planning with the patient. The patient was provided an opportunity to ask questions and all were answered. The patient agreed with the plan and demonstrated an understanding of the instructions. The patient was advised to call  our office if  symptoms worsen or feel they are in a crisis state and need immediate contact.   Therapist Location: [facility] Patient Location: home   Treatment Type: Individual Therapy  Reported Symptoms: anxiety, some depression  Mental Status Exam:  Appearance:   n/a  telehealth     Behavior:  Appropriate and Sharing  Motor:  n/a  telehealth  Speech/Language:   Normal Rate  Affect:  n/a  telehealth  Mood:  anxious and some depression  Thought process:  goal directed  Thought content:    WNL  Sensory/Perceptual disturbances:    WNL  Orientation:  oriented to person, place, time/date, situation, day of week, month of year and year  Attention:  Good  Concentration:  Good  Memory:  some forgetfulness (short term) and her PCP is aware  Fund of knowledge:   Good  Insight:    Good  Judgment:   Good  Impulse Control:  Good   Risk Assessment: Danger to Self:  No Self-injurious Behavior:  No Danger to Others: No Duty to Warn:no Physical Aggression / Violence:No  Access to Firearms a concern: No  Gang Involvement:No   Subjective: Patient reports "doing a little better, not quite as depressed and a bit more anxious at times, but I still have my moments."  Got her first vaccine shot and did fine. Had really stressed over it beforehand. Had several good times recently with grandkids.  Interventions: Cognitive Behavioral Therapy and Ego-Supportive  Diagnosis:   ICD-10-CM   1. Anxiety disorder, unspecified type  F41.9     Plan: Patient not signing tx goals on computer screen due to [place].  Treatment Goals:  Goals remain on tx plan as patient works on strategies to meet her goals. Progress will be documented each visit in "Progress" section on Plan.   Long term goal: (measurable) Reduce overall level, frequency, and intensity of the anxiety so that daily functioning is not impaired. * Patient will eventually report a rating of "3 or less" on 1-10 anxiety scale for a 82-month time period where her daily functioning is not impaired.   Short term goal: Increase understanding of beliefs and messages that produce the worry and anxiety.  Strategy: Patient will explore and work to stop cognitive messages that feed anxiety and work to replace them with more positive and empowering messages.   PROGRESS: Patient today reports anxiety and some depression.  Death in family past week of older cousin (dialysis patient, Covid). Processed her feelings of loss, and some frustrations with some other family members. Concerns again about her brother that  lives with her and his lack of safety at times.  Had another conversation with him past week about this. Had more anxious thoughts more recently about family situations, vaccine and "things I hear on the news and from other people".  Reviewed the process for interrupting anxious thoughts and replacing them with more realistic, positive,  and hopeful/empowering thoughts that do not lead to anxiety nor support anxiety. Discussed how practicing this more regularly can help her in meeting her goal of reducing the overall level, frequency,  and intensity of her anxiety.  Today rates her anxiety level as a "5-6". While acknowledging her areas of concern, patient today is also able to name at least 2 positives.  Goal review and progress/challenges noted with patient.  Next appt within 3 weeks.   Mathis Fare, LCSW

## 2020-02-20 ENCOUNTER — Ambulatory Visit: Payer: 59 | Admitting: Cardiology

## 2020-02-25 ENCOUNTER — Ambulatory Visit (INDEPENDENT_AMBULATORY_CARE_PROVIDER_SITE_OTHER): Payer: 59 | Admitting: Psychiatry

## 2020-02-25 DIAGNOSIS — F3181 Bipolar II disorder: Secondary | ICD-10-CM | POA: Diagnosis not present

## 2020-02-25 NOTE — Progress Notes (Signed)
[facility] Counselor/Therapist Progress Note  Patient ID: Chelsea Benson, MRN: [id number],    Date: 02/25/2020  Time Spent: 50 minutes   10:00am to 10:50am  Virtual Visit Note Connected with patient by a video enabled telemedicine/telehealth application or telephone, with their informed consent, and verified patient privacy and that I am speaking with the correct person using two identifiers. I discussed the limitations, risks, security and privacy concerns of performing psychotherapy and management service by telephone and the availability of in person appointments. I also discussed with the patient that there may be a patient responsible charge related to this service. The patient expressed understanding and agreed to proceed. I discussed the treatment planning with the patient. The patient was provided an opportunity to ask questions and all were answered. The patient agreed with the plan and demonstrated an understanding of the instructions. The patient was advised to call  our office if  symptoms worsen or feel they are in a crisis state and need immediate contact.   Therapist Location: [facility] Patient Location: home   Treatment Type: Individual Therapy   Reported Symptoms: anxious, stressed, concerned about family members recently diagnosed with Covid  Mental Status Exam:  Appearance:   n/a   telehealth     Behavior:  Sharing  Motor:  n/a  telehealth  Speech/Language:   Normal Rate  Affect:  n/a   telehealth  Mood:  anxious  Thought process:  goal directed  Thought content:    WNL  Sensory/Perceptual disturbances:    WNL  Orientation:  oriented to person, place, time/date, situation, day of week, month of year and year  Attention:  Good  Concentration:  Good  Memory:  some forgetfulness and her Dr is aware  Fund of knowledge:   Good  Insight:    Good  Judgment:   Good  Impulse Control:  Good   Risk Assessment: Danger to Self:  No Self-injurious  Behavior: No Danger to Others: No Duty to Warn:no Physical Aggression / Violence:No  Access to Firearms a concern: No  Gang Involvement:No   Subjective:  Interventions: Cognitive Behavioral Therapy and Ego-Supportive  Diagnosis:   ICD-10-CM   1. Bipolar II disorder ([place])  F31.81      Plan: Patient not signing tx goals on computer screen due to [place].  Treatment Goals:  Goals remain on tx plan as patient works on strategies to meet her goals. Progress will be documented each visit in "Progress" section on Plan.   Long term goal: (measurable) Reduce overall level, frequency, and intensity of the anxiety so that daily functioning is not impaired. * Patient will eventually report a rating of "3 or less" on 1-10 anxiety scale for a 84-month time period where her daily functioning is not impaired.   Short term goal: Increase understanding of beliefs and messages that produce the worry and anxiety.  Strategy: Patient will explore and work to stop cognitive messages that feed anxiety and work to replace them with more positive and empowering messages.   PROGRESS: Patient upset from the start of appt today mostly because some of her family members (that do not live with patient) have been diagnosed with Covid.  States they are "doing better' but she is "till upset and hard to calm down."   Encouraged her to speak her anxious thoughts and she did a really good job with this.  Shared her anxiety and her fears until she seemed exhausted of them.  At that point she  was able to see how her anxious thoughts have led her to be more fearful even though her family members are progressing and doing better at this point. Talking about this family concern got her to talking about past issues again with her mother years ago "but occasionally need to talk it out." Seemed much more calm and more grounded after venting. Is feeling that she may be able to come in person next session. Due to her anxiety  level today, we spoke again about the process for recognizing anxious thoughts and replacing them with more positive, reality-based, and empowering thoughts that do not lead to anxiety and increased stress, while also for this patient seems to support mood stability. This is more difficult for patient to do on her own outside of therapy session but she states "it helps.".  On 1-10 scale for anxiety she rates herself an "8", but does feel less anxiety after talking today.   Goal review and progress/challenges noted with patient.  Next appt within 2-3 weeks.    Mathis Fare, LCSW

## 2020-03-10 ENCOUNTER — Ambulatory Visit (INDEPENDENT_AMBULATORY_CARE_PROVIDER_SITE_OTHER): Payer: 59 | Admitting: Psychiatry

## 2020-03-10 DIAGNOSIS — F419 Anxiety disorder, unspecified: Secondary | ICD-10-CM | POA: Diagnosis not present

## 2020-03-10 NOTE — Progress Notes (Signed)
[facility] Counselor/Therapist Progress Note  Patient ID: Chelsea Benson, MRN: [id number],    Date: 03/10/2020  Time Spent:  48 minutes  10:00am to 10:48am  Treatment Type: Individual Therapy  Reported Symptoms: anxiety, reports "little mood instability since last appt."   Mental Status Exam:  Appearance:   n/a  telehealth     Behavior:  Appropriate, Sharing and Motivated  Motor:  Normal  Speech/Language:   Normal Rate  Affect:  n/a  telehealth  Mood:  anxious  Thought process:  goal directed  Thought content:    WNL  Sensory/Perceptual disturbances:    WNL  Orientation:  oriented to person, place, time/date, situation, day of week, month of year and year  Attention:  Good  Concentration:  Good  Memory:  some forgetfulness but some better recently  Progress Energy of knowledge:   Good  Insight:    Good  Judgment:   Good  Impulse Control:  Good   Risk Assessment: Danger to Self:  No Self-injurious Behavior: No Danger to Others: No Duty to Warn:no Physical Aggression / Violence:No  Access to Firearms a concern: No  Gang Involvement:No   Subjective: Patient today reports anxiety issues but no depression.  Frustrations with several situations including family and health that she discussed in session today.    Interventions: Cognitive Behavioral Therapy and Ego-Supportive  Diagnosis:   ICD-10-CM   1. Anxiety disorder, unspecified type  F41.9     Plan: Patient not signing tx goals on computer screen due to [place].  Treatment Goals:  Goals remain on tx plan as patient works on strategies to meet her goals. Progress will be documented each visit in "Progress" section on Plan.   Long term goal:(measurable) Reduce overall level, frequency, and intensity of the anxiety so that daily functioning is not impaired.* Patient will eventually report a rating of "3 or less" on 1-10 anxiety scale for a 8-month time period where her daily functioning is not  impaired.  Short term goal: Increase understanding of beliefs and messages that produce the worry and anxiety.  Strategy: Patient will explore and work to stop cognitive messages that feed anxiety and work to replace them with more positive and empowering messages.   PROGRESS: Patient today reporting anxiety mostly focused health concern, family, "things that go on in the world especially during the pandemic" and  "worrying about what might happen".  Discussed some of her family concerns at length and how she is handling situations more recently with family members. Family members who had Covid are now well so that stressor is gone. Based on her anxiety, we worked more today on her anxious thoughts.  Patient able to share her most frequent anxious thoughts and we worked to change each of them to be more positive, reality-based, and empowering thought patterns that do not support anxiety.  She acknowledges that it is difficult for her to identify the anxious thoughts "in the moment" on her own, but we agreed she has made some progress. And with some direction she is (per goal above) understanding better some of the beliefs/messages that feed her anxiety.  Encouraged good self-care for patient including getting outside every day, good sleep patterns, physical movement, contact with friends, healthy boundaries healthy nutrition, doing things she enjoys like movies and adult coloring books and cooking, and increasing her positive self-talk.  Goal review and progress/challenges noted with patient.  Next appt within 2 weeks.   Mathis Fare, LCSW

## 2020-03-12 ENCOUNTER — Other Ambulatory Visit: Payer: Self-pay | Admitting: *Deleted

## 2020-03-12 MED ORDER — ALBUTEROL SULFATE HFA 108 (90 BASE) MCG/ACT IN AERS: 2.0000 | INHALATION_SPRAY | RESPIRATORY_TRACT | 1 refills | Status: DC | PRN

## 2020-03-15 IMAGING — DX CHEST - 2 VIEW
2 series · 2 of 2 positions shown · non-contrast
Comparison: Chest x-rays dated 02/14/2019 and 05/11/2017.

CLINICAL DATA: Fall, pain.

EXAM:
CHEST - 2 VIEW

[chest lat]
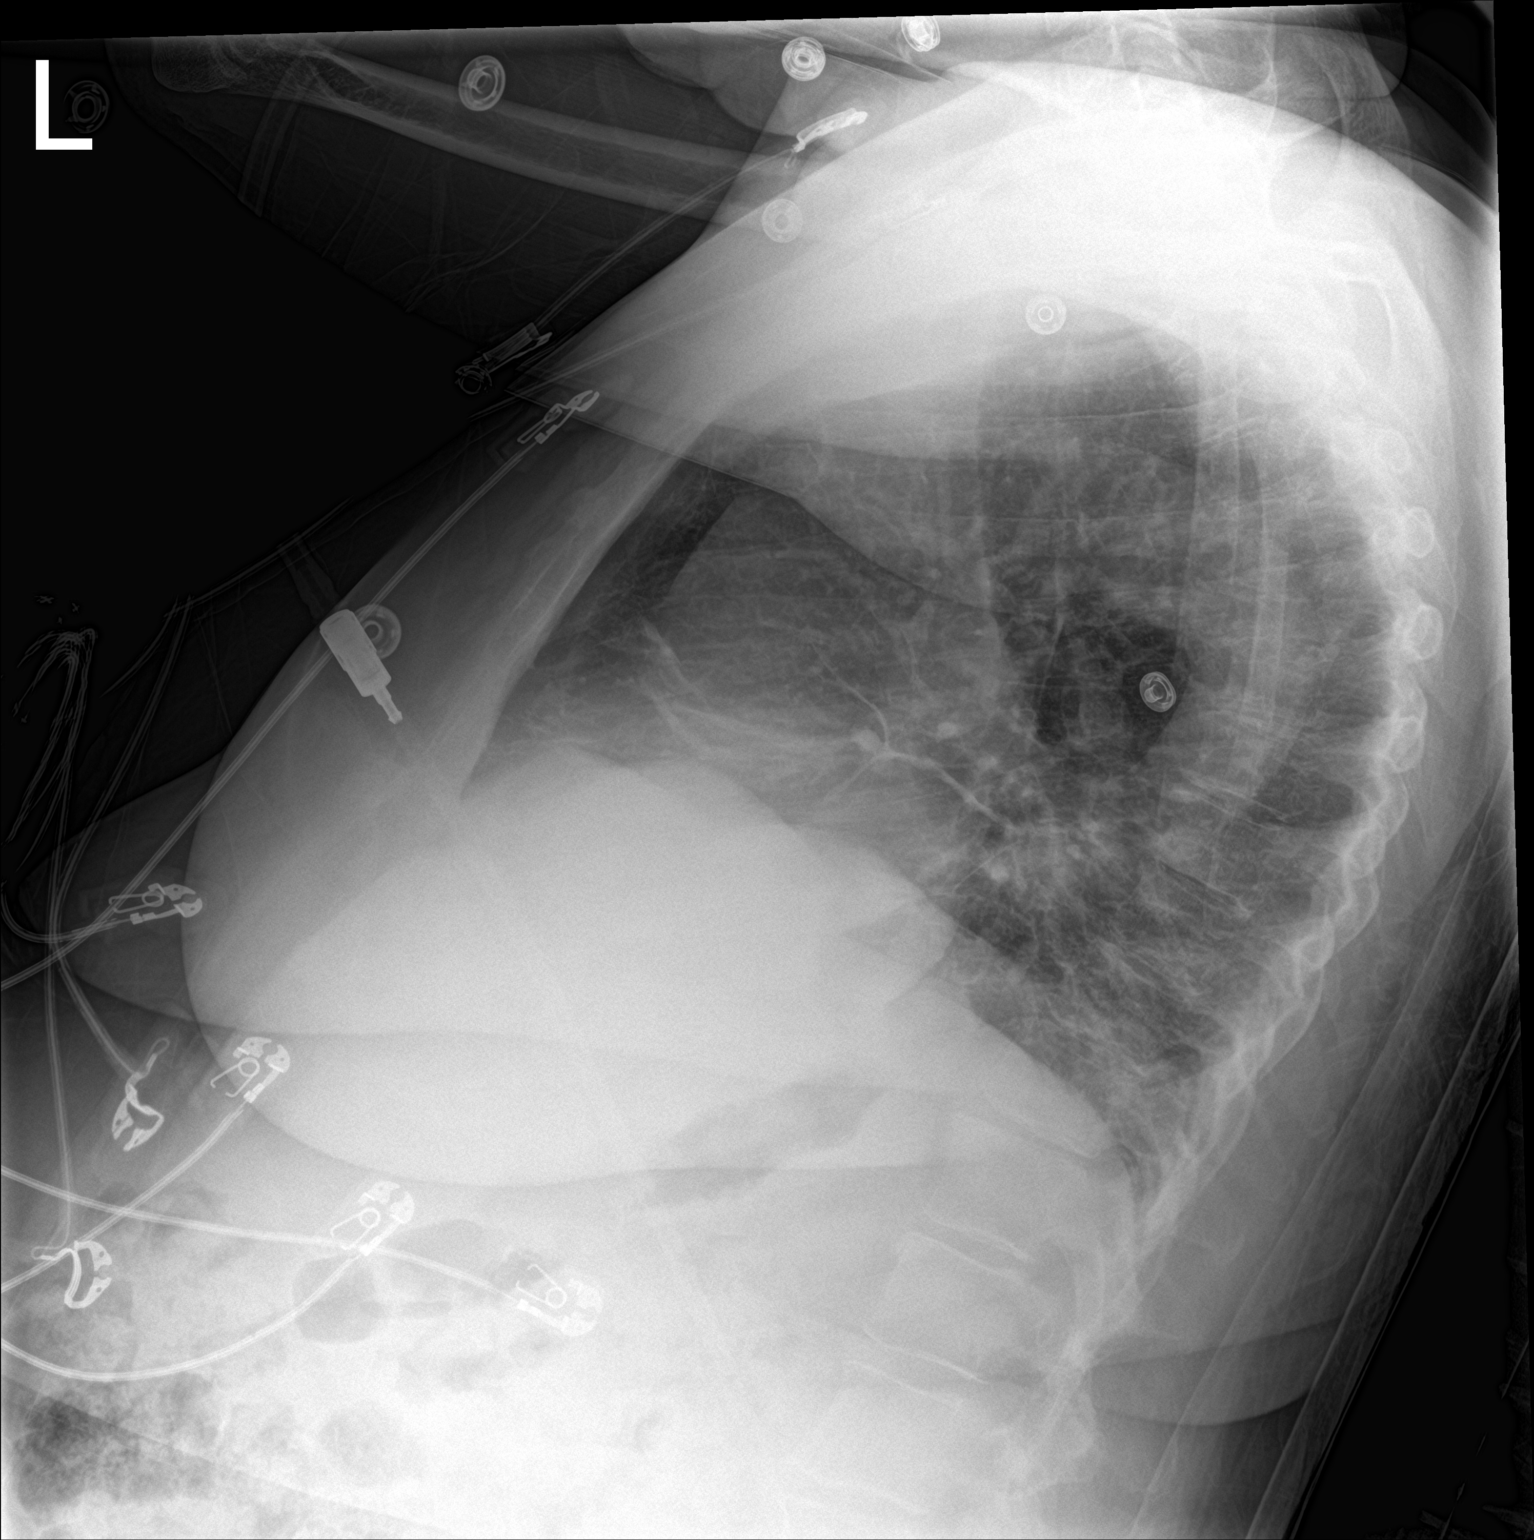

[chest ap]
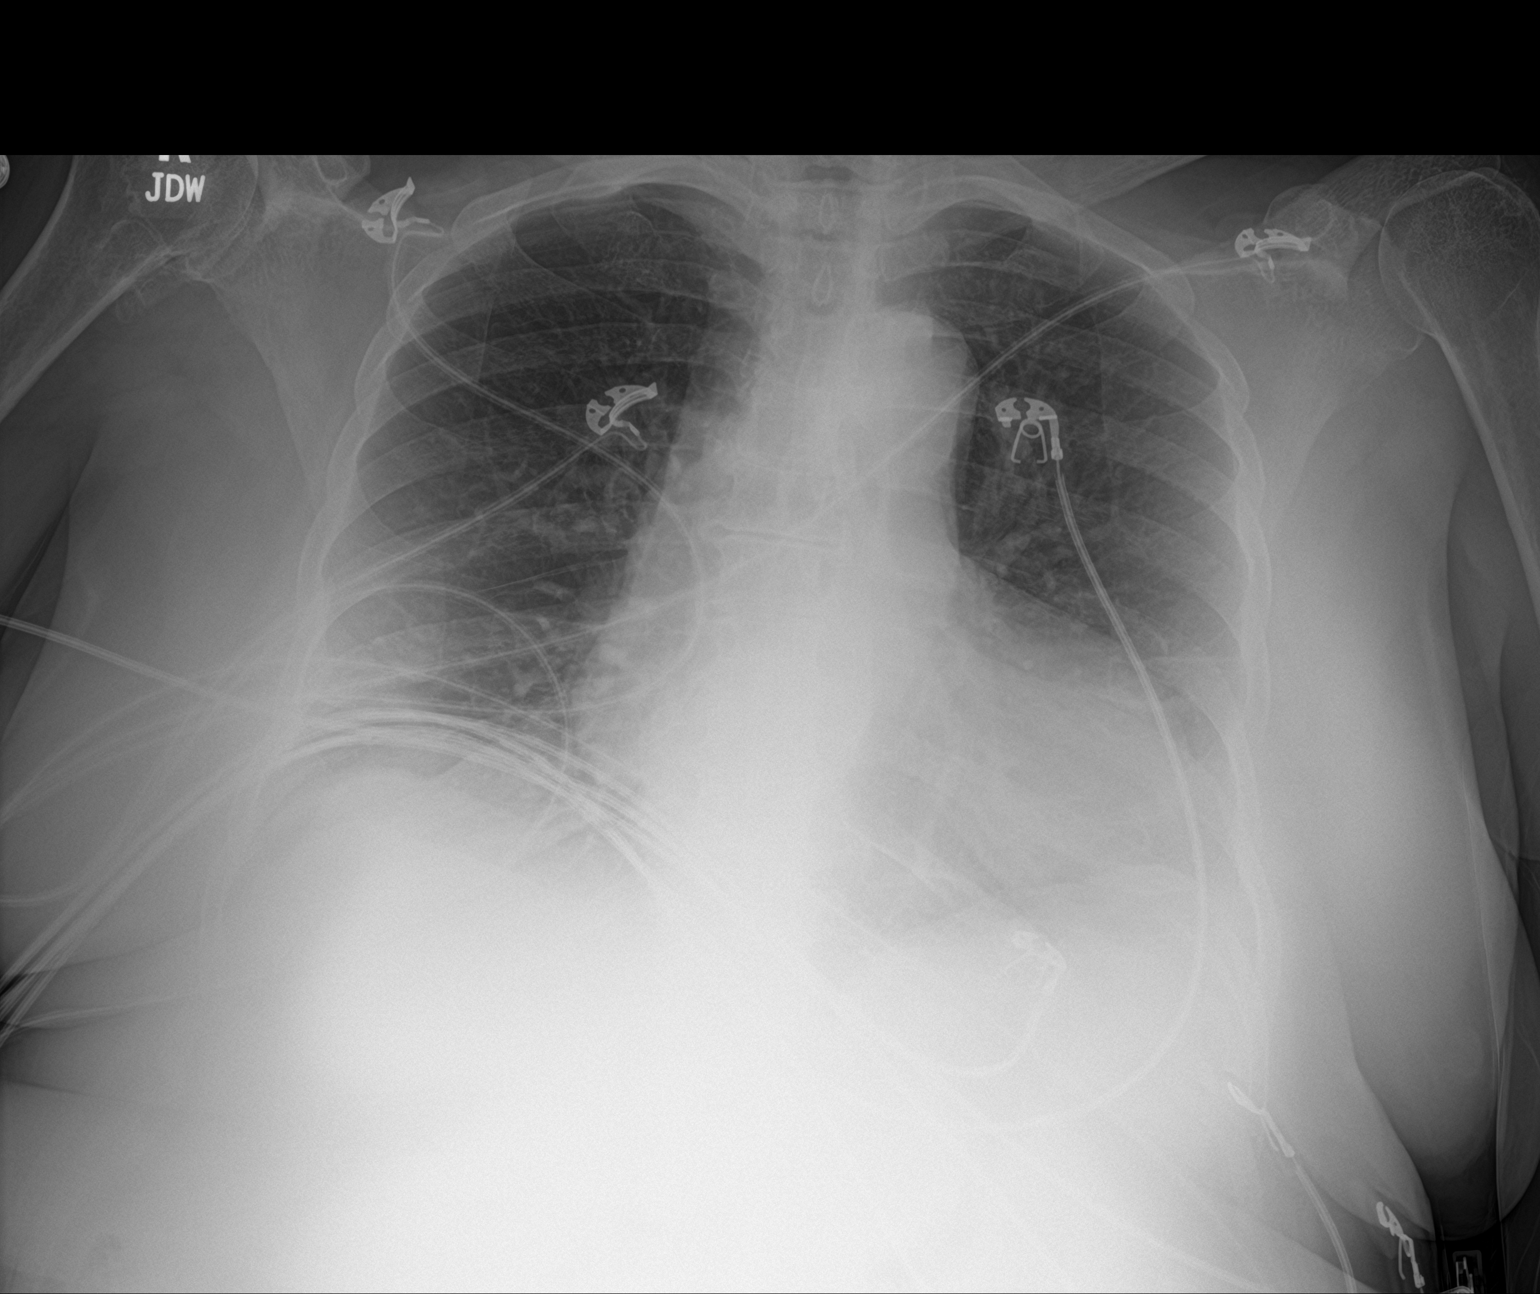

[2 of 2 positions shown; findings below may reference images not displayed]

FINDINGS: Heart size and mediastinal contours are not significantly changed
given the semi upright positioning. Lungs are clear. No pleural
effusion or pneumothorax seen. No acute appearing osseous
abnormality. Stable kyphosis of the thoracic spine.
IMPRESSION: No acute findings.  Lungs are clear.

## 2020-03-15 IMAGING — DX LEFT TIBIA AND FIBULA - 2 VIEW
4 series · 4 of 4 positions shown · non-contrast
Comparison: None.

CLINICAL DATA: Fall, calf pain.

EXAM:
LEFT TIBIA AND FIBULA - 2 VIEW

[tibia ap (1 of 2)]
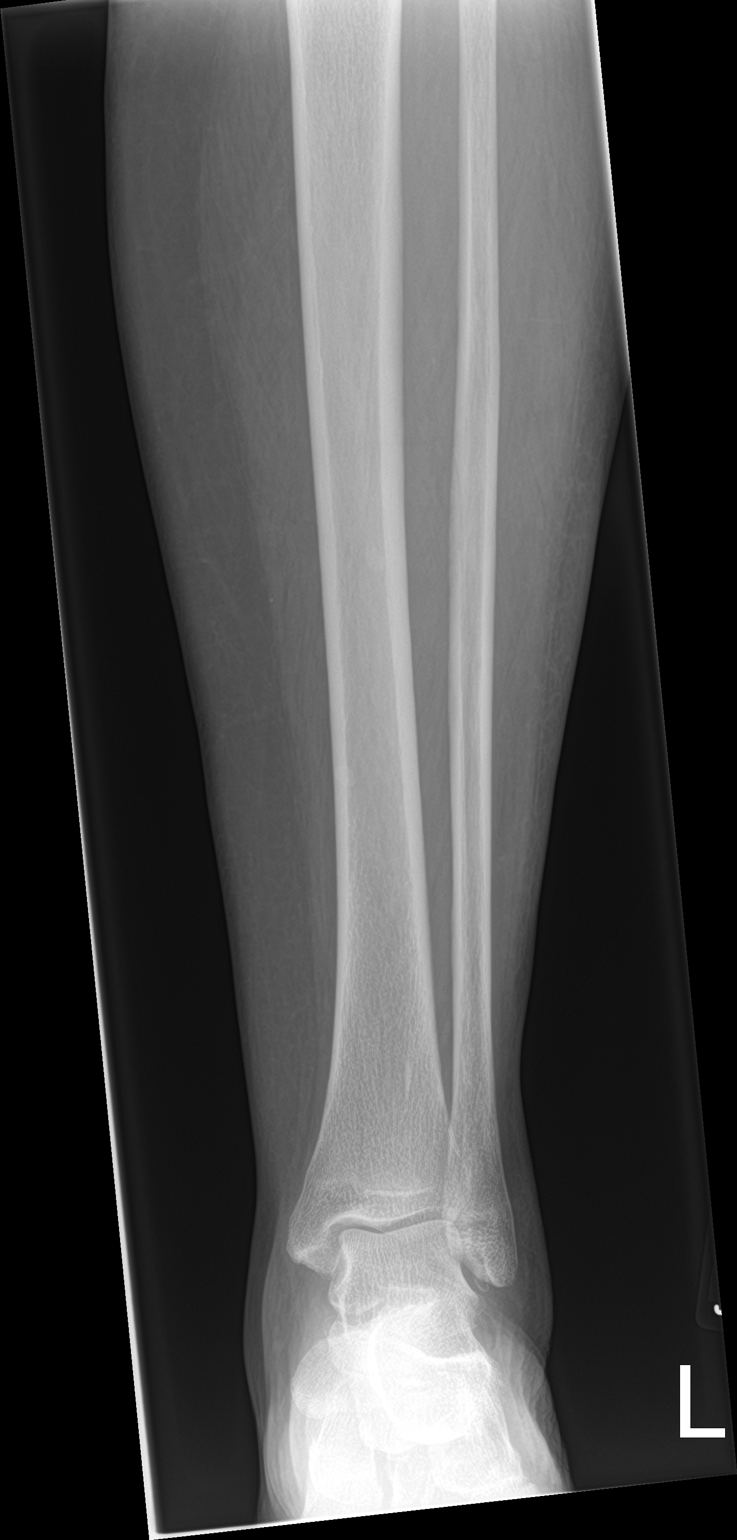

[tibia ap (2 of 2)]
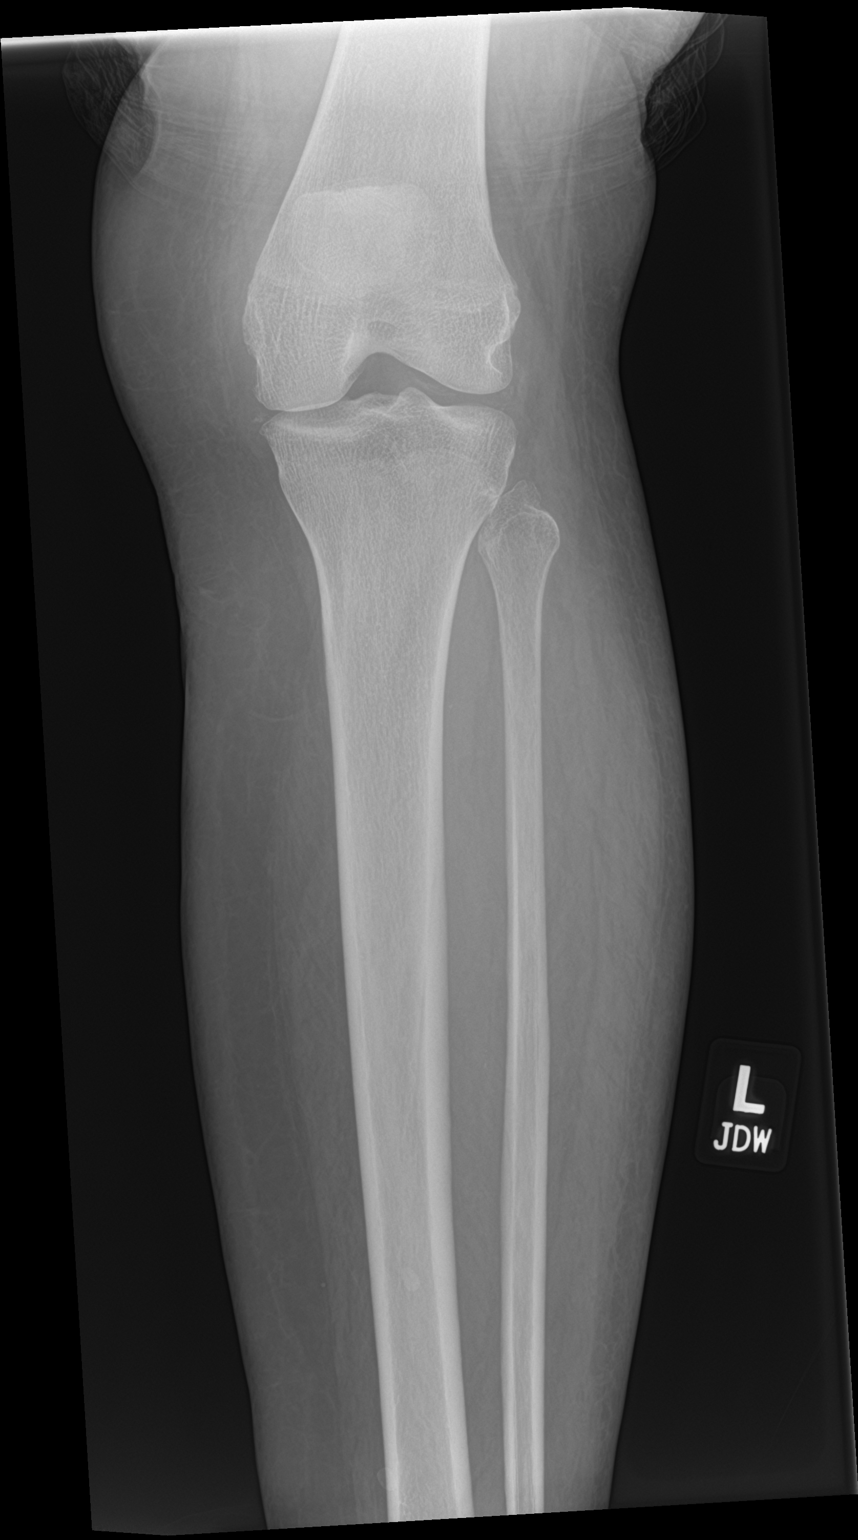

[tibia lat (1 of 2)]
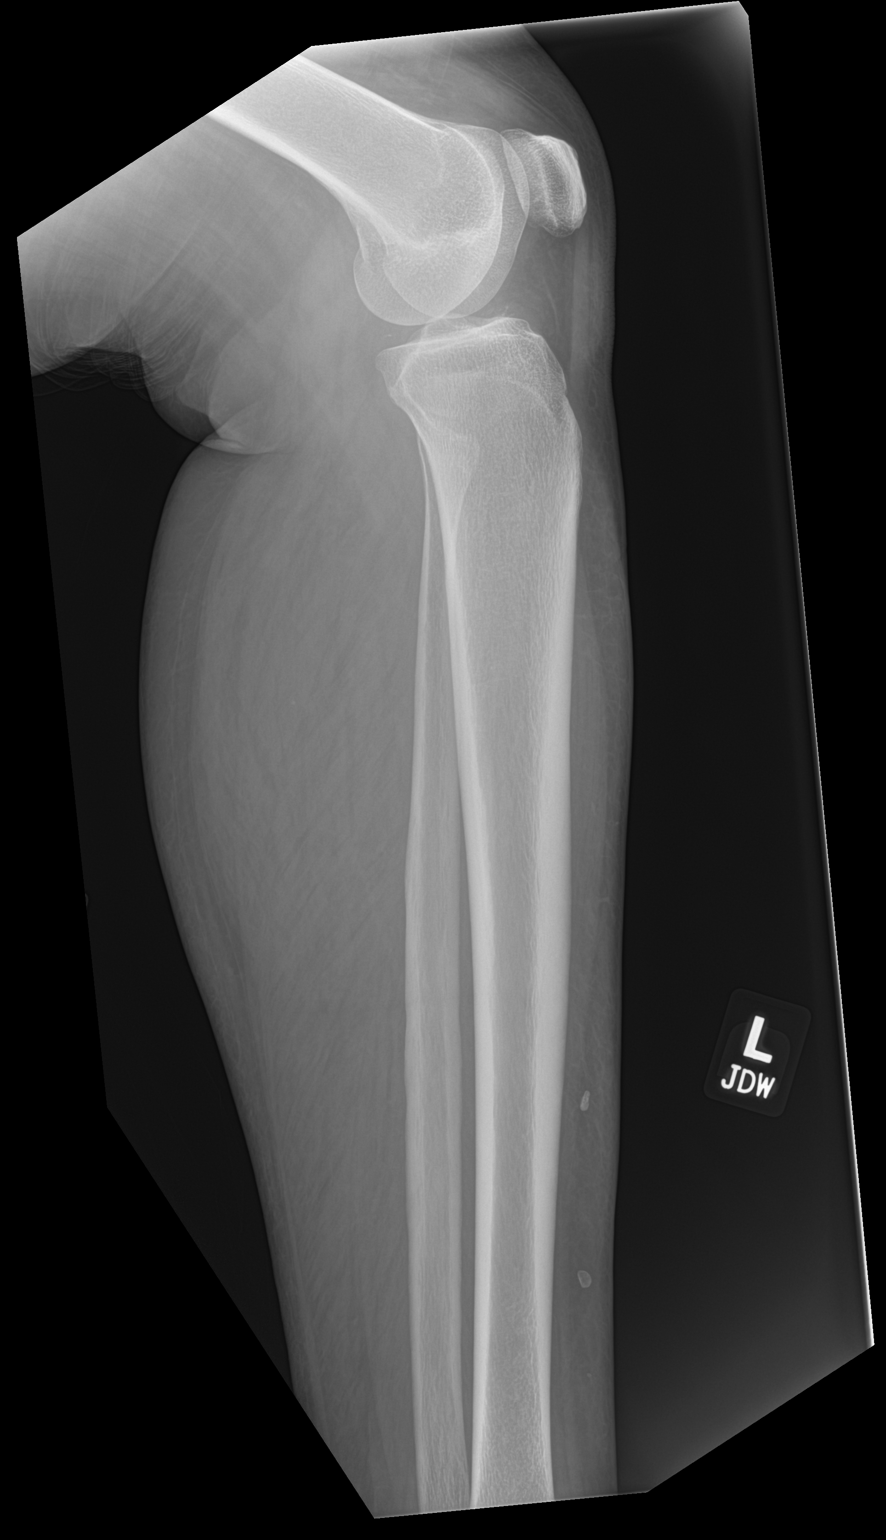

[tibia lat (2 of 2)]
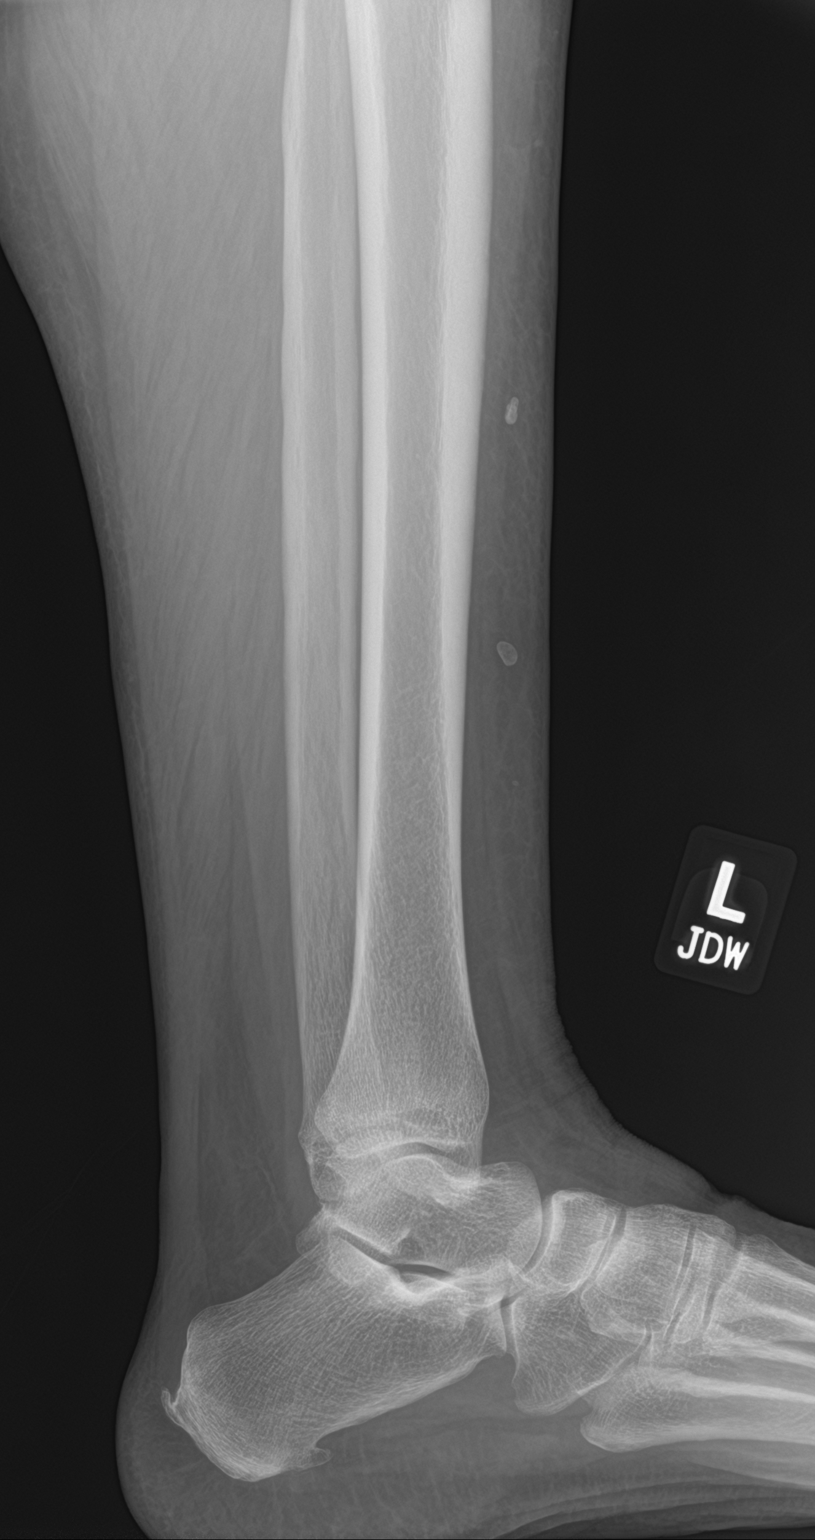

[4 of 4 positions shown; findings below may reference images not displayed]

FINDINGS: There is no evidence of fracture or other focal bone lesions. Soft
tissues are unremarkable.
IMPRESSION: Negative.

## 2020-03-16 ENCOUNTER — Other Ambulatory Visit: Payer: Self-pay | Admitting: *Deleted

## 2020-03-16 MED ORDER — PROAIR HFA 108 (90 BASE) MCG/ACT IN AERS: 2.0000 | INHALATION_SPRAY | RESPIRATORY_TRACT | 1 refills | Status: DC | PRN

## 2020-03-24 ENCOUNTER — Ambulatory Visit (INDEPENDENT_AMBULATORY_CARE_PROVIDER_SITE_OTHER): Payer: 59 | Admitting: Psychiatry

## 2020-03-24 DIAGNOSIS — F419 Anxiety disorder, unspecified: Secondary | ICD-10-CM

## 2020-03-24 NOTE — Progress Notes (Signed)
[facility] Counselor/Therapist Progress Note  Patient ID: Chelsea Benson, MRN: [id number],    Date: 03/24/2020  Time Spent: 48 minutes  10:00am to 10:48am   Virtual Visit Note Connected with patient by a video enabled telemedicine/telehealth application or telephone, with their informed consent, and verified patient privacy and that I am speaking with the correct person using two identifiers. I discussed the limitations, risks, security and privacy concerns of performing psychotherapy and management service by telephone and the availability of in person appointments. I also discussed with the patient that there may be a patient responsible charge related to this service. The patient expressed understanding and agreed to proceed. I discussed the treatment planning with the patient. The patient was provided an opportunity to ask questions and all were answered. The patient agreed with the plan and demonstrated an understanding of the instructions. The patient was advised to call  our office if  symptoms worsen or feel they are in a crisis state and need immediate contact.   Therapist Location: [facility] Patient Location: home   Treatment Type: Individual Therapy  Reported Symptoms: anxiety (more generalized), some depression, anxiety specific to Covid  Mental Status Exam:  Appearance:   n/a  telehealth     Behavior:  Appropriate, Sharing and Motivated  Motor:  Normal  Speech/Language:   Normal Rate  Affect:  n/a  telehealth  Mood:  anxious and some depression but "not as bad today"  Thought process:  goal directed  Thought content:    some ocassional rumination  Sensory/Perceptual disturbances:    WNL  Orientation:  oriented to person, place, time/date, situation, day of week, month of year and year  Attention:  Good  Concentration:  Good  Memory:  only ocassional forgetvulness  Fund of knowledge:   Good  Insight:    Good  Judgment:   Good  Impulse Control:   Good   Risk Assessment: Danger to Self:  No Self-injurious Behavior: No Danger to Others: No Duty to Warn:no Physical Aggression / Violence:No  Access to Firearms a concern: No  Gang Involvement:No   Subjective: Patient today reporting anxiety and some depression. Still having significant anxiety about Covid and is taking precautions.  Has received her 1st and 2nd vaccinations.   Interventions: Cognitive Behavioral Therapy and Ego-Supportive  Diagnosis:   ICD-10-CM   1. Anxiety disorder, unspecified type  F41.9      Plan: Patient not signing tx goals on computer screen due to COVID.  Treatment Goals:  Goals remain on tx plan as patient works on strategies to meet her goals. Progress will be documented each visit in "Progress" section on Plan.   Long term goal:(measurable) Reduce overall level, frequency, and intensity of the anxiety so that daily functioning is not impaired.* Patient will eventually report a rating of "4" or less" on 1-10 anxiety scale for a 70-month time period where her daily functioning is not impaired.  Short term goal: Increase understanding of beliefs and messages that produce the worry and anxiety.  Strategy: Patient will explore and work to stop cognitive messages that feed anxiety and work to replace them with more positive and empowering messages.   PROGRESS: Patient today with improved energy, some reduction in anxiety, and decrease in depression.  Is today still very concerned and anxious about Covid situation. Talks through some of her anxiousness about it today but is reassured some since she has her vaccinations and continues to wear a mask and has recently gotten  a face shield to use which makes her feel more protected. Still having a lot of anxious thoughts and currently some of them are focused on "the virus."  Worked today on naming those thoughts, challenging their validity, and replacing them with more positive, reality-based, and  empowering thought pattern that do not support anxiety.  Patient does fairly well with these as we work together.  She acknowledges that it's more difficult on her own to catch her anxious thoughts and interrupt them, however she is going to make more of an effort to do the same thought-changing exercise at home and report in next session.  Good self-care is also encouraged including setting limits on TV/online viewing that accentuates her anxiety, having contact with friends/family, good nutrition, some exercise as possible, movies that are "not upsetting and that entertain me", cooking, and increasing her positive self-talk.   Goal review and progress noted with patient.  Next appt within 2-3 weeks.    Shanon Ace, LCSW

## 2020-03-30 ENCOUNTER — Ambulatory Visit (INDEPENDENT_AMBULATORY_CARE_PROVIDER_SITE_OTHER): Payer: 59 | Admitting: Cardiology

## 2020-03-30 ENCOUNTER — Other Ambulatory Visit: Payer: Self-pay

## 2020-03-30 ENCOUNTER — Encounter: Payer: Self-pay | Admitting: Cardiology

## 2020-03-30 VITALS — BP 130/64 | HR 83 | Temp 97.1°F | Ht 64.0 in | Wt 182.2 lb

## 2020-03-30 DIAGNOSIS — Z7189 Other specified counseling: Secondary | ICD-10-CM | POA: Diagnosis not present

## 2020-03-30 DIAGNOSIS — E785 Hyperlipidemia, unspecified: Secondary | ICD-10-CM

## 2020-03-30 DIAGNOSIS — I10 Essential (primary) hypertension: Secondary | ICD-10-CM | POA: Diagnosis not present

## 2020-03-30 NOTE — Patient Instructions (Signed)
Medication Instructions:  No changes  *If you need a refill on your cardiac medications before your next appointment, please call your pharmacy*   Lab Work: Not needed   Testing/Procedures: Not needed   Follow-Up: At [facility], you and your health needs are our priority.  As part of our continuing mission to provide you with exceptional heart care, we have created designated Provider Care Teams.  These Care Teams include your primary Cardiologist (physician) and Advanced Practice Providers (APPs -  Physician Assistants and Nurse Practitioners) who all work together to provide you with the care you need, when you need it.     Your next appointment:   12 month(s)  The format for your next appointment:   In Person  Provider:    Bryan Lemma, MD   Other Instructions N/a

## 2020-03-30 NOTE — Progress Notes (Signed)
Primary Care Provider: Kathyrn Lass, MD, Marda Stalker, PA Cardiologist: Glenetta Hew, MD Electrophysiologist: None  Clinic Note: Chief Complaint  Patient presents with  . Follow-up    Hypertension, bradycardia and cardiac risk factors   HPI:    Chelsea Benson is a 69 y.o. female with a PMH below who presents today for what amounts to be 2-year follow-up for hypertension.[name] was seen on initial consultation back in February 2019.  Apparently she has a history of bradycardia and hypertension.  She noted that she was limited as far as activity by chronic back pain with neuropathy.  No syncope or near syncope.  Deconditioning noted. --> We attempted but never did get her evaluation 12/25/2015 including a stress test and echo.  Recent Hospitalizations:   08/21/2019: Posterior lumbar fusion-L3 and L4(Dr. Saintclair Halsted)  May 2020-ER visit for fall with leg pain.  With a sudden fall, she denied any loss of consciousness.  Noted intermittent episodes of unsteady stumbling.  Poor balance.  Reviewed  CV studies:    The following studies were reviewed today: (if available, images/films reviewed: From Epic Chart or Care Everywhere) . None:   Interval History:   [name] returns here today overall doing fairly well.  She is still recuperating from multiple different surgeries including her right knee and back.  The knee was in 2018 and the back was in September 2020.  Is been relatively a slow go for her recovering from this, indicating that her activity level significantly reduced..  She does have intermittent episodes of shortness of breath that can be exacerbated with exertion.  This is usually well controlled with her nebulizers.  She also has intermittent episodes of epigastric discomfort after eating.  She says it usually is relieved with burping.  Seems to be related to indigestion.  She has had no syncope or near-syncope symptoms.  CV Review of Symptoms  (Summary) Cardiovascular ROS: positive for - dyspnea on exertion and Epigastric discomfort likely related to indigestion.  Exertional dyspnea is related deconditioning negative for - edema, irregular heartbeat, orthopnea, palpitations, paroxysmal nocturnal dyspnea, rapid heart rate, shortness of breath or Syncope/near syncope or TIA/amaurosis fugax, claudication  The patient does not have symptoms concerning for COVID-19 infection (fever, chills, cough, or new shortness of breath).  The patient is practicing social distancing & Masking.   She has had both of her COVID-19 vaccine injections.-[employer]  REVIEWED OF SYSTEMS   Review of Systems  Constitutional: Positive for malaise/fatigue.  HENT: Negative for nosebleeds.   Respiratory: Positive for cough and shortness of breath.        Related to asthma  Gastrointestinal: Positive for abdominal pain (Epigastric) and heartburn. Negative for blood in stool and melena.  Genitourinary: Negative for hematuria.  Musculoskeletal: Positive for back pain and joint pain (Left lower leg and right knee).  Neurological: Positive for dizziness. Negative for focal weakness and weakness.       With some unsteady gait.  Psychiatric/Behavioral: Negative for memory loss. The patient is not nervous/anxious.    I have reviewed and (if needed) personally updated the patient's problem list, medications, allergies, past medical and surgical history, social and family history.   PAST MEDICAL HISTORY   Past Medical History:  Diagnosis Date  . Allergic rhinoconjunctivitis   . Allergy history unknown   . Anxiety   . Arthritis    DDD, spondylosis  . Asthma   . Bipolar disorder ([place])   . Chronic back pain  Has had several surgeries.  He uses a walker  . Chronic kidney disease    renal calculi  . Depression   . Diabetes mellitus without complication ([place])    dx in 2007  . Diabetes mellitus, type II ([place])   . Eczema   . Headache(784.0)    sinus related     . History of kidney stones   . Hx of blood clots    lungs  . Hx of pulmonary embolus 2017  . Hyperlipidemia   . Hypertension   . Insomnia   . Knee pain   . Left ankle pain   . Memory change   . Mental disorder   . Mood disorder ([place])   . OSA on CPAP    PSG 05/24/2016: AHI 17/hour 02 mean 76%.  HSAT 06/13/17, AHI 11-hour 02 mean 79%  . Recurrent upper respiratory infection (URI)   . Sleep apnea   . Tremor of both hands     PAST SURGICAL HISTORY   Past Surgical History:  Procedure Laterality Date  . ABDOMINAL HYSTERECTOMY    . BACK SURGERY     2012- lumbar fusion  . BREAST SURGERY     L cyst removed - 1972  . BUNIONECTOMY Left    Great toe  . calf -R- cyst removed    . CARPAL TUNNEL RELEASE     both hands   . HAMMER TOE SURGERY Left    L foot  . NASAL SINUS SURGERY    . NM MYOVIEW LTD  12/2008   Normal study.  Normal LV function.  EF 61%.  Apical thinning but no ischemia or infarction.  Marland Kitchen SHOULDER ARTHROSCOPY Left    R shoulder- RCR  . TOE SURGERY    . TOTAL KNEE ARTHROPLASTY Right 05/22/2017   Procedure: TOTAL KNEE ARTHROPLASTY;  Surgeon: Frederik Pear, MD;  Location: [facility];  Service: Orthopedics;  Laterality: Right;  . TRANSTHORACIC ECHOCARDIOGRAM  06/2009    Technically difficult.  Moderate concentric LVH with normal LV function.  No regional wall motion normalities.  EF> 55%.  Normal right ventricle.  No valve lesions.  Normal filling pressures.  Marland Kitchen TRIGGER FINGER RELEASE     L thumb  . TUBAL LIGATION      MEDICATIONS/ALLERGIES   Current Meds  Medication Sig  . ACCU-CHEK GUIDE test strip TEST BID PRN  . albuterol (PROVENTIL) (2.5 MG/3ML) 0.083% nebulizer solution Take 3 mLs (2.5 mg total) by nebulization every 4 (four) hours as needed for wheezing or shortness of breath.  Marland Kitchen albuterol (VENTOLIN HFA) 108 (90 Base) MCG/ACT inhaler Inhale 2 puffs into the lungs every 4 (four) hours as needed.  . ARIPiprazole (ABILIFY) 2 MG tablet Take 1 tablet (2 mg total) by mouth  daily.  Marland Kitchen aspirin EC 81 MG tablet Take 81 mg by mouth daily.  Marland Kitchen azelastine (ASTELIN) 0.1 % nasal spray Place 1 spray into both nostrils 2 (two) times daily.  Marland Kitchen azelastine (OPTIVAR) 0.05 % ophthalmic solution Place 1 drop into both eyes 2 (two) times daily.  . Blood Glucose Monitoring Suppl (ACCU-CHEK GUIDE) w/Device KIT See admin instructions.  . busPIRone (BUSPAR) 15 MG tablet Take 1.5 tablets (22.5 mg total) by mouth 2 (two) times daily.  Marland Kitchen CLOBETASOL PROPIONATE E 0.05 % emollient cream Apply 1 application topically 2 (two) times daily as needed (rash).   Marland Kitchen desonide (DESOWEN) 0.05 % ointment Apply 1 application topically 2 (two) times daily as needed.  Marland Kitchen dextromethorphan-guaiFENesin (MUCINEX DM) 30-600 MG 12hr  tablet Take 1 tablet by mouth 2 (two) times daily.  Marland Kitchen diltiazem (CARDIZEM CD) 180 MG 24 hr capsule Take 180 mg by mouth daily.   Marland Kitchen EPINEPHrine (EPIPEN 2-PAK) 0.3 mg/0.3 mL IJ SOAJ injection Inject 0.3 mLs (0.3 mg total) into the muscle as needed.  . famotidine (PEPCID) 20 MG tablet TAKE 1 TABLET BY MOUTH  DAILY AFTER SUPPER.  . fexofenadine (ALLEGRA) 180 MG tablet Take 1 tablet (180 mg total) by mouth daily.  . fluticasone (FLONASE) 50 MCG/ACT nasal spray Place 2 sprays into both nostrils daily.  . hydrochlorothiazide (HYDRODIURIL) 25 MG tablet Take 25 mg by mouth daily.   . hydrOXYzine (ATARAX/VISTARIL) 25 MG tablet TAKE 1 TABLET(25 MG) BY MOUTH EVERY 6 HOURS AS NEEDED  . levocetirizine (XYZAL) 5 MG tablet Take 1 tablet (5 mg total) by mouth daily.  . Mepolizumab (NUCALA) 100 MG/ML SOAJ Inject 100 mg into the skin every 28 (twenty-eight) days.  . metFORMIN (GLUCOPHAGE-XR) 500 MG 24 hr tablet Take 500 mg by mouth 1 day or 1 dose.   . mirtazapine (REMERON) 30 MG tablet TAKE 1 TABLET BY MOUTH AT  BEDTIME  . montelukast (SINGULAIR) 10 MG tablet TAKE 1 TABLET BY MOUTH AT  BEDTIME  . Oxcarbazepine (TRILEPTAL) 300 MG tablet Take 2 tablets (600 mg total) by mouth at bedtime.  Marland Kitchen oxybutynin  (DITROPAN-XL) 10 MG 24 hr tablet Take 10 mg by mouth daily.   . pantoprazole (PROTONIX) 40 MG tablet TAKE 1 TABLET BY MOUTH  DAILY. TAKE 30 TO 60  MINUTES BEFORE FIRST MEAL  OF THE DAY  . potassium chloride SA (K-DUR) 20 MEQ tablet Take 20 mEq by mouth daily.  . pravastatin (PRAVACHOL) 20 MG tablet Take 20 mg by mouth at bedtime.   . pregabalin (LYRICA) 100 MG capsule Take 100 mg by mouth 3 (three) times daily.  Marland Kitchen PROAIR HFA 108 (90 Base) MCG/ACT inhaler Inhale 2 puffs into the lungs every 4 (four) hours as needed for wheezing or shortness of breath.  Marland Kitchen Respiratory Therapy Supplies (NEBULIZER) DEVI Use as directed with nebulizer solution.  Marland Kitchen Respiratory Therapy Supplies (NEBULIZER/TUBING/MOUTHPIECE) KIT Use as directed with nebulizer machine  . Semaglutide, 1 MG/DOSE, (OZEMPIC, 1 MG/DOSE,) 2 MG/1.5ML SOPN Take 1 mg by mouth once a week.  . SYMBICORT 160-4.5 MCG/ACT inhaler USE 2 INHALATIONS BY MOUTH  TWICE DAILY  . tiZANidine (ZANAFLEX) 2 MG tablet Take 1 tablet (2 mg total) by mouth every 6 (six) hours as needed for muscle spasms.  Marland Kitchen zolpidem (AMBIEN) 10 MG tablet Take 0.5-1 tablets (5-10 mg total) by mouth at bedtime as needed for sleep.  . [DISCONTINUED] Mepolizumab (NUCALA) 100 MG/ML SOAJ Inject 100 mg into the skin every 30 (thirty) days.  . [DISCONTINUED] OZEMPIC, 0.25 OR 0.5 MG/DOSE, 2 MG/1.5ML SOPN Inject 0.5 mg into the skin every 7 (seven) days.     Allergies  Allergen Reactions  . Carvedilol Cough  . Cymbalta [Duloxetine Hcl] Other (See Comments)    Caused hair loss  . Fish Allergy Itching    Halibut fish  . Morphine And Related Itching  . Rosuvastatin Calcium Other (See Comments) and Cough    Rapid heart rate  . Almond Meal Itching and Swelling    Tongue swells and itches  . Almond Oil Itching and Swelling    Itching and swelling of tongue   . Losartan Potassium Cough    SOCIAL HISTORY/FAMILY HISTORY   Reviewed in Epic:  Pertinent findings: No new changes  OBJCTIVE  -  PE, EKG, labs   Wt Readings from Last 3 Encounters:  03/30/20 182 lb 3.2 oz (82.6 kg)  08/21/19 185 lb 6 oz (84.1 kg)  08/14/19 188 lb 4.8 oz (85.4 kg)    Physical Exam: BP 130/64   Pulse 83   Temp (!) 97.1 F (36.2 C)   Ht _0  (1.626 m)   Wt 182 lb 3.2 oz (82.6 kg)   SpO2 97%   BMI 31.27 kg/m  Physical Exam  Constitutional: She is oriented to person, place, and time. She appears well-developed. No distress.  Borderline chronically ill-appearing.  Nontoxic.  Well-groomed.  HENT:  Head: Normocephalic and atraumatic.  Neck: No JVD present.  Cardiovascular: Regular rhythm, S1 normal, S2 normal and intact distal pulses.  No extrasystoles are present. Bradycardia present. PMI is not displaced. Exam reveals distant heart sounds and decreased pulses (Mildly diminished pedal pulses are palpable.). Exam reveals no gallop and no friction rub.  Murmur heard. High-pitched harsh crescendo-decrescendo early systolic murmur is present with a grade of 1/6 at the upper right sternal border. Pulmonary/Chest: Effort normal. No respiratory distress. She has wheezes (Late expiratory wheeze with forced expiration). She has no rales.  Abdominal: Soft. Bowel sounds are normal. She exhibits no distension. There is abdominal tenderness (Epigastric).  No HSM  Musculoskeletal:        General: No edema. Normal range of motion.     Cervical back: Normal range of motion and neck supple.  Neurological: She is alert and oriented to person, place, and time.  Psychiatric: She has a normal mood and affect. Her behavior is normal. Judgment and thought content normal.  Vitals reviewed.   Adult ECG Report  Rate: 83 ;  Rhythm: normal sinus rhythm, premature atrial contractions (PAC) and Right atrial abnormality.  Poor R wave progression.  Cannot rule out anterior MI.;   Narrative Interpretation: Stable EKG.  Recent Labs: November 14, 2018-TC 154, TG 82, HDL 51, LDL 87.  (March 2021A1c 7.3)  No results found  for: CHOL, HDL, LDLCALC, LDLDIRECT, TRIG, CHOLHDL Lab Results  Component Value Date   CREATININE 0.81 08/14/2019   BUN 17 08/14/2019   NA 141 08/14/2019   K 3.0 (L) 08/14/2019   CL 103 08/14/2019   CO2 28 08/14/2019   No results found for: TSH  ASSESSMENT/PLAN    Problem List Items Addressed This Visit    Hyperlipidemia with target LDL less than 100 (Chronic)    Unfortunately, I now have any labs since December 2019.  LDL was 87 which is relatively well controlled for current risk factors.  Labs been followed by PCP.  She is currently on pravastatin and tolerating relatively well.      Essential hypertension - Primary (Chronic)    Blood pressures pretty well controlled today on diltiazem and HCTZ. Has had a history of bradycardia, would not titrate diltiazem further.  If additional blood pressure control is required, would probably add ARB.      Relevant Orders   EKG 12-Lead (Completed)   Cardiac risk counseling    With obesity, hyper tension and diabetes, she does meet criteria for metabolic syndrome which is an increased risk for coronary disease.  She does not have a significant family history of CAD.  She has intermittent episodes of chest comfort which sound more consistent with nonanginal symptoms. Plan: Continue aspirin and statin along with blood pressure control with aggressive vaccine control.  Provided she shows that she is consistently following up, we can consider cardiovascular  stratification with coronary calcium score.          COVID-19 Education: The signs and symptoms of COVID-19 were discussed with the patient and how to seek care for testing (follow up with PCP or arrange E-visit).   The importance of social distancing and COVID-19 vaccination was discussed today.  I spent a total of 20mnutes with the patient. >  50% of the time was spent in direct patient consultation.  Additional time spent with chart review  / charting (studies, outside notes,  etc): 8 Total Time: 26 min   Current medicines are reviewed at length with the patient today.  (+/- concerns) none  Notice: This dictation was prepared with Dragon dictation along with smaller phrase technology. Any transcriptional errors that result from this process are unintentional and may not be corrected upon review.  Patient Instructions / Medication Changes & Studies & Tests Ordered   Patient Instructions  Medication Instructions:  No changes  *If you need a refill on your cardiac medications before your next appointment, please call your pharmacy*   Lab Work: Not needed   Testing/Procedures: Not needed   Follow-Up: [facility] and your health needs are our priority.  As part of our continuing mission to provide you with exceptional heart care, we have created designated Provider Care Teams.  These Care Teams include your primary Cardiologist (physician) and Advanced Practice Providers (APPs -  Physician Assistants and Nurse Practitioners) who all work together to provide you with the care you need, when you need it.     Your next appointment:   12 month(s)  The format for your next appointment:   In Person  Provider:    DGlenetta Hew MD   Other Instructions N/a    Studies Ordered:   Orders Placed This Encounter  Procedures  . EKG 12-Lead     DGlenetta Hew M.D., M.S. Interventional Cardiologist   Pager # [phone] # [phone] [place] [address]  Thank you for choosing [facility]!

## 2020-04-04 ENCOUNTER — Other Ambulatory Visit: Payer: Self-pay | Admitting: Psychiatry

## 2020-04-04 DIAGNOSIS — F419 Anxiety disorder, unspecified: Secondary | ICD-10-CM

## 2020-04-05 ENCOUNTER — Encounter: Payer: Self-pay | Admitting: Cardiology

## 2020-04-05 NOTE — Assessment & Plan Note (Signed)
Blood pressures pretty well controlled today on diltiazem and HCTZ. Has had a history of bradycardia, would not titrate diltiazem further.  If additional blood pressure control is required, would probably add ARB.

## 2020-04-05 NOTE — Assessment & Plan Note (Signed)
Unfortunately, I now have any labs since December 2019.  LDL was 87 which is relatively well controlled for current risk factors.  Labs been followed by PCP.  She is currently on pravastatin and tolerating relatively well.

## 2020-04-05 NOTE — Assessment & Plan Note (Signed)
With obesity, hyper tension and diabetes, she does meet criteria for metabolic syndrome which is an increased risk for coronary disease.  She does not have a significant family history of CAD.  She has intermittent episodes of chest comfort which sound more consistent with nonanginal symptoms. Plan: Continue aspirin and statin along with blood pressure control with aggressive vaccine control.  Provided she shows that she is consistently following up, we can consider cardiovascular stratification with coronary calcium score.

## 2020-04-07 ENCOUNTER — Ambulatory Visit (INDEPENDENT_AMBULATORY_CARE_PROVIDER_SITE_OTHER): Payer: 59 | Admitting: Psychiatry

## 2020-04-07 DIAGNOSIS — F419 Anxiety disorder, unspecified: Secondary | ICD-10-CM

## 2020-04-07 NOTE — Progress Notes (Signed)
[facility] Counselor/Therapist Progress Note  Patient ID: YITTEL EMRICH, MRN: [id number],    Date: 04/07/2020   Time Spent: 45 minutes  10:00am to 10:45am   Virtual Visit via Telephone Note Connected with patient by a video enabled telemedicine/telehealth application or telephone, with their informed consent, and verified patient privacy and that I am speaking with the correct person using two identifiers. I discussed the limitations, risks, security and privacy concerns of performing psychotherapy and management service by telephone and the availability of in person appointments. I also discussed with the patient that there may be a patient responsible charge related to this service. The patient expressed understanding and agreed to proceed. I discussed the treatment planning with the patient. The patient was provided an opportunity to ask questions and all were answered. The patient agreed with the plan and demonstrated an understanding of the instructions. The patient was advised to call  our office if  symptoms worsen or feel they are in a crisis state and need immediate contact.   Therapist Location: [facility] Patient Location: home   Treatment Type: Individual Therapy  Reported Symptoms: anxiety, some depression  Mental Status Exam:  Appearance:   n/a  telehealth     Behavior:  Sharing  Motor:  n/a  telehealth  Speech/Language:   Normal Rate  Affect:  n/a  telehealth  Mood:  anxious  Thought process:  goal directed  Thought content:    WNL  Sensory/Perceptual disturbances:    WNL  Orientation:  oriented to person, place, time/date, situation, day of week, month of year and year  Attention:  Good  Concentration:  Good  Memory:  some forgetfulness  Fund of knowledge:   Good  Insight:    Good and Fair  Judgment:   Good  Impulse Control:  Good   Risk Assessment: Danger to Self:  No Self-injurious Behavior: No Danger to Others: NoDuty to  Warn:no Physical Aggression / Violence:No  Access to Firearms a concern: No  Gang Involvement:No   Subjective:   Interventions: Cognitive Behavioral Therapy and Solution-Oriented/Positive Psychology  Diagnosis:   ICD-10-CM   1. Anxiety disorder, unspecified type  F41.9     Plan: Patient not signing tx goals on computer screen due to COVID.  Treatment Goals:  Goals remain on tx plan as patient works on strategies to meet her goals. Progress will be documented each visit in "Progress" section on Plan.   Long term goal:(measurable) Reduce overall level, frequency, and intensity of the anxiety so that daily functioning is not impaired.* Patient will eventually report a rating of "4" or less" on 1-10 anxiety scale for a 83-month time period where her daily functioning is not impaired.  Short term goal: Increase understanding of beliefs and messages that produce the worry and anxiety.  Strategy: Patient will explore and work to stop cognitive messages that feed anxiety and work to replace them with more positive and empowering messages.   PROGRESS:  Patient today reporting anxiety and depression (some improvement recently).  Is concerned today primarily about recent health concerns that she has just recently learned about which is a "spot" discovered on her right breast that wasn't there previously, so she is going back to doctor for re-check.  Also recent contact with doctor re: possible pinched nerve affecting her back and leg. The is to see doctors for both of these health conditions next week to get more information. Anxiety has peaked some recently, and is "more worried" at this  point due to these 2 situations and needed most of our session to process her anxiety, concerns, and feel heard/supported. Having more anxious thoughts which we challenged together in session today.  Able to see that her anxious thoughts are not reality-based and lead her to worry more.  Worked to  re-frame her situation as she waits for her appts, and change her anxious thoughts to be more supportive and  reality-based, which feels better to patient. Is glad her appts are scheduled soon so she doesn't have a lot of time to wait for some answers from her doctors. To continue good self-care including: good nutrition, contact with family and friends, getting outside daily, not watching things online nor TV that are disturbing, relying on her faith, and positive self-talk.  Goal review and progress noted with patient.  Next appt within 2-3 weeks.   Shanon Ace, LCSW

## 2020-04-10 ENCOUNTER — Telehealth: Payer: Self-pay

## 2020-04-10 NOTE — Telephone Encounter (Signed)
Call from patient.  Pt is in need of nebulizer tubing and supplies.  Pt states that she attempted to call [facility] and no one would answer.  Call to [facility], spoke with Tiffany. Per Tiffany they will reach out to [facility] and get supplies sent to her.  Call to patient, to make her aware of [facility] reaching out to her.  Pt verbalized understanding.  Call ended.

## 2020-04-13 ENCOUNTER — Telehealth: Payer: Self-pay | Admitting: *Deleted

## 2020-04-13 NOTE — Telephone Encounter (Signed)
[facility] paperwork has been placed in Dr. Randell Patient mailbox for her to review and sign.

## 2020-04-14 ENCOUNTER — Telehealth: Payer: Self-pay | Admitting: Nurse Practitioner

## 2020-04-14 ENCOUNTER — Other Ambulatory Visit: Payer: Self-pay | Admitting: Student

## 2020-04-14 DIAGNOSIS — M5440 Lumbago with sciatica, unspecified side: Secondary | ICD-10-CM

## 2020-04-14 NOTE — Telephone Encounter (Signed)
Phone call to patient to verify medication list and allergies for myelogram procedure. Pt instructed to hold abilify, buspar and mirtazapine for 48hrs prior to myelogram appointment time. Pt verbalized understanding. Pre and post procedure instructions reviewed with pt.

## 2020-04-15 ENCOUNTER — Telehealth: Payer: Self-pay | Admitting: Allergy

## 2020-04-15 NOTE — Telephone Encounter (Deleted)
Patient called and said that she

## 2020-04-15 NOTE — Telephone Encounter (Signed)
completed

## 2020-04-15 NOTE — Telephone Encounter (Signed)
Spoke with patient and advise her Dr. Delorse Lek will fill out paperwork as soon as she can after seeing patients so that we can send it over to [facility] sometime soon. Please advise.

## 2020-04-15 NOTE — Telephone Encounter (Signed)
Patient called and said that she had not heard anything from [place] about sending a rx to [facility] for nebulizer tubing. [phone].

## 2020-04-15 NOTE — Telephone Encounter (Signed)
Prescription has been signed by Dr. Delorse Lek and has been faxed back to [facility].

## 2020-04-15 NOTE — Telephone Encounter (Signed)
See other telephone note from A. Fernandez-Vernon.

## 2020-04-15 NOTE — Telephone Encounter (Signed)
Per Chelsea Benson  Patient called and said that she had not heard anything from [place] about sending a rx to [facility] for nebulizer tubing. [phone].

## 2020-04-17 ENCOUNTER — Other Ambulatory Visit: Payer: Self-pay | Admitting: *Deleted

## 2020-04-17 MED ORDER — NEBULIZER/TUBING/MOUTHPIECE KIT: PACK | 12 refills | Status: AC

## 2020-04-20 ENCOUNTER — Telehealth: Payer: Self-pay | Admitting: Allergy

## 2020-04-20 DIAGNOSIS — J4540 Moderate persistent asthma, uncomplicated: Secondary | ICD-10-CM

## 2020-04-20 MED ORDER — ALBUTEROL SULFATE (2.5 MG/3ML) 0.083% IN NEBU: 2.5000 mg | INHALATION_SOLUTION | RESPIRATORY_TRACT | 1 refills | Status: DC | PRN

## 2020-04-20 NOTE — Telephone Encounter (Signed)
Patient called and said that she needs nebulizer solution called into [facility] pharmacy [phone]

## 2020-04-20 NOTE — Telephone Encounter (Signed)
Prescription has been sent in to [facility]. Called patient and advised. Patient verbalized understanding.

## 2020-04-20 NOTE — Addendum Note (Signed)
Addended by: Dollene Cleveland R on: 04/20/2020 02:23 PM   Modules accepted: Orders

## 2020-04-21 ENCOUNTER — Ambulatory Visit (INDEPENDENT_AMBULATORY_CARE_PROVIDER_SITE_OTHER): Payer: 59 | Admitting: Psychiatry

## 2020-04-21 ENCOUNTER — Other Ambulatory Visit: Payer: Self-pay

## 2020-04-21 DIAGNOSIS — F419 Anxiety disorder, unspecified: Secondary | ICD-10-CM

## 2020-04-21 NOTE — Progress Notes (Signed)
      [facility] Counselor/Therapist Progress Note  Patient ID: BABE ANTHIS, MRN: [id number],    Date: 04/21/2020  Time Spent: 60 minutes  10:00am to 11:00am  Treatment Type: Individual Therapy  Reported Symptoms: "general" anxiety, anxiety increased with health concerns "with my back"   Mental Status Exam:  Appearance:   Neat     Behavior:  Appropriate and Sharing  Motor:  Normal  Speech/Language:   Normal Rate  Affect:  anxious, frustratetion  Mood:  anxious  Thought process:  goal directed  Thought content:    WNL  Sensory/Perceptual disturbances:    WNL  Orientation:  oriented to person, place, time/date, situation, day of week, month of year and year  Attention:  Good  Concentration:  Good  Memory:  occasional forgetfulness worse when anxious  Fund of knowledge:   Good  Insight:    Good  Judgment:   Good  Impulse Control:  Good   Risk Assessment: Danger to Self:  No Self-injurious Behavior: No Danger to Others: No Duty to Warn:no Physical Aggression / Violence:No  Access to Firearms a concern: No  Gang Involvement:No   Subjective:  Patient in today reporting some "general" anxiety , frustration, and anxiety specific to health concerns with back and upcoming breast biopsy. "I'm anxious but not really down as I try to keep myself up and get through every day."  Interventions: Cognitive Behavioral Therapy and Solution-Oriented/Positive Psychology  Diagnosis:   ICD-10-CM   1. Anxiety disorder, unspecified type  F41.9     Plan: Patient not signing tx goals on computer screen due to COVID.  Treatment Goals:  Goals remain on tx plan as patient works on strategies to meet her goals. Progress will be documented each visit in "Progress" section on Plan.   Long term goal:(measurable) Reduce overall level, frequency, and intensity of the anxiety so that daily functioning is not impaired.* Patient will eventually report a rating of "4"or less" on 1-10  anxiety scale for a46-month time period where her daily functioning is not impaired.  Short term goal: Increase understanding of beliefs and messages that produce the worry and anxiety.  Strategy: Patient will explore and work to stop cognitive messages that feed anxiety and work to replace them with more positive and empowering messages.   PROGRESS: Patient in today with some more general anxiety and anxiety specific to health concerns which currently include her back issues and an upcoming breast biopsy.  States she is going to have a procedure done that will determine if there's a pinched nerve affecting her back. Some of her family is supportive of her. Paying closer attention to monitoring her diabetes and "watching my carbs." Anxious thoughts continue and we reflected again on how those thoughts are almost always not based on reality and tend to feed patient's anxiety. "I just try to calm myself" and uses strategies we've discussed earlier including deep breathing exercises, using her hand weights, and "squeeze" balls, bouncing on my exercise ball, and praying. Encouraged her to keep getting outside daily, having contact with family/friends daily, good nutrition, not watching distressful news/programs online or on TV, as well as practicing more positive/encouraging self-talk.  Goal review and progress noted with patient.  Next appt within 3 weeks.   Mathis Fare, LCSW

## 2020-04-23 ENCOUNTER — Telehealth: Payer: Self-pay | Admitting: *Deleted

## 2020-04-23 ENCOUNTER — Encounter: Payer: Self-pay | Admitting: Allergy

## 2020-04-23 ENCOUNTER — Other Ambulatory Visit: Payer: Self-pay

## 2020-04-23 ENCOUNTER — Ambulatory Visit (INDEPENDENT_AMBULATORY_CARE_PROVIDER_SITE_OTHER): Payer: 59 | Admitting: Allergy

## 2020-04-23 DIAGNOSIS — T7800XA Anaphylactic reaction due to unspecified food, initial encounter: Secondary | ICD-10-CM

## 2020-04-23 DIAGNOSIS — J4541 Moderate persistent asthma with (acute) exacerbation: Secondary | ICD-10-CM | POA: Diagnosis not present

## 2020-04-23 DIAGNOSIS — J3089 Other allergic rhinitis: Secondary | ICD-10-CM

## 2020-04-23 DIAGNOSIS — H1013 Acute atopic conjunctivitis, bilateral: Secondary | ICD-10-CM

## 2020-04-23 DIAGNOSIS — T7800XD Anaphylactic reaction due to unspecified food, subsequent encounter: Secondary | ICD-10-CM

## 2020-04-23 DIAGNOSIS — J0101 Acute recurrent maxillary sinusitis: Secondary | ICD-10-CM

## 2020-04-23 MED ORDER — PREDNISONE 10 MG PO TABS: ORAL_TABLET | ORAL | 0 refills | Status: AC

## 2020-04-23 MED ORDER — AMOXICILLIN-POT CLAVULANATE 875-125 MG PO TABS: 1.0000 | ORAL_TABLET | Freq: Two times a day (BID) | ORAL | 0 refills | Status: AC

## 2020-04-23 MED ORDER — BUDESONIDE-FORMOTEROL FUMARATE 160-4.5 MCG/ACT IN AERO: 2.0000 | INHALATION_SPRAY | Freq: Two times a day (BID) | RESPIRATORY_TRACT | 1 refills | Status: DC

## 2020-04-23 NOTE — Patient Instructions (Addendum)
Recurrent sinusitis with acute sinusitis  -We will treat with a course of Augmentin and a prednisone 5-day pack.  -She will continue her routine medications as below    Allergic rhinitis with conjunctivitis - Continue Flonase 2 sprays daily as needed.  Use flonase for 1-2 weeks at a time before stopping once symptoms improve. - Continue Astelin nasal spray 2 sprays in each nostril twice a day as needed for nasal drainage/post-nasal drip - Continue saline nasal rinses once a day for nasal symptoms. Use before nasal sprays  -Continue Xyzal 5 mg at this time (may take additional dose if needed to control allergy symptoms) - Continue Singulair 10 mg at bedtime -Continue Optivar 1 drop in each twice a day as needed for watery, itchy, red eyes - Continue allergen avoidance measures   Moderate persistent asthma with current exacerbation - Continue Symbicort 160 - 2 puffs twice daily - Continue singulair as above  - have access to albuterol inhaler 2 puffs every 4-6 hours as needed for cough/wheeze/shortness of breath/chest tightness.  May use 15-20 minutes prior to activity.   Monitor frequency of use.   -Continue Nucala monthly autoinjector to allow for ease of home administration.  Daugther is giving her injections once a month.    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 - Carry EpiPen at all times.   Flexural atopic dermatitis - Continue moisturizing routine with Aveeno - Use triamcinolone 0.1% below the face.     Follow up in 3  or sooner if needed.

## 2020-04-23 NOTE — Telephone Encounter (Signed)
Called and spoke with patient and she stated that she would not be able to physically come in this week because Medicaid transport requires a 3 day notice. Spoke with Dr. Delorse Lek and she was fine doing a telephone visit. Patient has been scheduled for telephone visit today.

## 2020-04-23 NOTE — Progress Notes (Signed)
RE: Chelsea Benson MRN: [id number] DOB: [birth year] Date of Telemedicine Visit: 04/23/2020  Referring provider: Kathyrn Lass, MD Primary care provider: Marda Stalker, PA-C  Chief Complaint: Asthma, Allergic Rhinitis  (stuffy nose, lately has been waking up like this, blowing her nose all day), Cough (cough is really loose, but nothing coming up), Wheezing (in the morning), Shortness of Breath, Food Intolerance (no accidental food exposures), and Other (having a procedure on her back, 05/01/20 and biopsy of right breast 05/11/20)   Telemedicine Follow Up Visit via Telephone: I connected with Chelsea Benson for a follow up on 04/25/20 by telephone and verified that I am speaking with the correct person using two identifiers.   I discussed the limitations, risks, security and privacy concerns of performing an evaluation and management service by telephone and the availability of in person appointments. I also discussed with the patient that there may be a patient responsible charge related to this service. The patient expressed understanding and agreed to proceed.  Patient is at home..  Provider is at the office.  Visit start time: 1105 Visit end time: 1118 Insurance consent/check in by: Chelsea Benson Medical consent and medical assistant/nurse: Chelsea Benson  History of Present Illness: She is a 69 y.o. female, who is being followed for allergic rhinitis with conjunctivitis, asthma, food allergy and eczema. Her previous allergy office visit was on 02/05/2020 as a telemedicine visit with Chelsea Benson.   Her visit today is for sick visit.  She states starting Monday of this week she developed nasal congestion in the morning and reports she had a hacking cough that is now more of a "rattling" cough and wheezing.  She also reports having frontal headache, sinus pressure and pain.  She denies any fevers at this time.  No loss of sense of taste or smell.  She denies any Covid exposures.  She is using her  Flonase as well as Astelin nasal sprays.  She is taking Xyzal 5 mg daily.  She also takes her Singulair daily.  She has Optivar for use for any itchy or watery eyes.  She is taking her Symbicort 160 mcg 2 puffs twice a day.  She states she has been needing to use her albuterol at least twice a day over the past week.  She does get her Nucala  monthly injections at home by her daughter. She continues to avoid almonds without any accidental ingestions or need to use her epinephrine device.  She does not have any complaints at this time regards to her eczema.  Act score of 15  Assessment and Plan: Chelsea Benson is a 69 y.o. female with:   Recurrent sinusitis with acute sinusitis  -We will treat with a course of Augmentin and a prednisone 5-day pack.  -She will continue her routine medications as below    Allergic rhinitis with conjunctivitis - Continue Flonase 2 sprays daily as needed.  Use flonase for 1-2 weeks at a time before stopping once symptoms improve. - Continue Astelin nasal spray 2 sprays in each nostril twice a day as needed for nasal drainage/post-nasal drip - Continue saline nasal rinses once a day for nasal symptoms. Use before nasal sprays  -Continue Xyzal 5 mg at this time (may take additional dose if needed to control allergy symptoms) - Continue Singulair 10 mg at bedtime -Continue Optivar 1 drop in each twice a day as needed for watery, itchy, red eyes - Continue allergen avoidance measures   Moderate persistent asthma with current exacerbation -  Continue Symbicort 160 - 2 puffs twice daily - Continue singulair as above  - have access to albuterol inhaler 2 puffs every 4-6 hours as needed for cough/wheeze/shortness of breath/chest tightness.  May use 15-20 minutes prior to activity.   Monitor frequency of use.   -Continue Nucala monthly autoinjector to allow for ease of home administration.  Daugther is giving her injections once a month.    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 - Carry EpiPen at all times.   Flexural atopic dermatitis - Continue moisturizing routine with Aveeno - Use triamcinolone 0.1% below the face.   Follow up in 3  or sooner if needed.    Diagnostics: None.  Medication List:  Current Outpatient Medications  Medication Sig Dispense Refill  . ACCU-CHEK GUIDE test strip TEST BID PRN  1  . albuterol (PROVENTIL) (2.5 MG/3ML) 0.083% nebulizer solution Take 3 mLs (2.5 mg total) by nebulization every 4 (four) hours as needed for wheezing or shortness of breath. 375 mL 1  . albuterol (VENTOLIN HFA) 108 (90 Base) MCG/ACT inhaler Inhale 2 puffs into the lungs every 4 (four) hours as needed. 54 g 1  . ARIPiprazole (ABILIFY) 2 MG tablet Take 1 tablet (2 mg total) by mouth daily. 90 tablet 1  . aspirin EC 81 MG tablet Take 81 mg by mouth daily.    Marland Kitchen azelastine (ASTELIN) 0.1 % nasal spray Place 1 spray into both nostrils 2 (two) times daily. 90 mL 1  . azelastine (OPTIVAR) 0.05 % ophthalmic solution Place 1 drop into both eyes 2 (two) times daily. 6 mL 5  . Blood Glucose Monitoring Suppl (ACCU-CHEK GUIDE) w/Device KIT See admin instructions.  1  . busPIRone (BUSPAR) 15 MG tablet Take 1.5 tablets (22.5 mg total) by mouth 2 (two) times daily. 270 tablet 0  . CLOBETASOL PROPIONATE E 0.05 % emollient cream Apply 1 application topically 2 (two) times daily as needed (rash).     Marland Kitchen desonide (DESOWEN) 0.05 % ointment Apply 1 application topically 2 (two) times daily as needed. 180 g 1  . diltiazem (CARDIZEM CD) 180 MG 24 hr capsule Take 180 mg by mouth daily.     Marland Kitchen EPINEPHrine (EPIPEN 2-PAK) 0.3 mg/0.3 mL IJ SOAJ injection Inject 0.3 mLs (0.3 mg total) into the muscle as needed. 2 each 2  . famotidine (PEPCID) 20 MG tablet TAKE 1 TABLET BY MOUTH  DAILY AFTER SUPPER. 90 tablet 3  . fluticasone (FLONASE) 50 MCG/ACT nasal spray Place 2 sprays into both nostrils daily. 48 g 1  . hydrochlorothiazide  (HYDRODIURIL) 25 MG tablet Take 25 mg by mouth daily.     . hydrOXYzine (ATARAX/VISTARIL) 25 MG tablet TAKE 1 TABLET BY MOUTH  EVERY 6 HOURS AS NEEDED 180 tablet 0  . levocetirizine (XYZAL) 5 MG tablet Take 1 tablet (5 mg total) by mouth daily. 30 tablet 5  . Mepolizumab (NUCALA) 100 MG/ML SOAJ Inject 100 mg into the skin every 28 (twenty-eight) days. 0.84 mL 11  . metFORMIN (GLUCOPHAGE-XR) 500 MG 24 hr tablet Take 500 mg by mouth 1 day or 1 dose.     . mirtazapine (REMERON) 30 MG tablet TAKE 1 TABLET BY MOUTH AT  BEDTIME 90 tablet 3  . montelukast (SINGULAIR) 10 MG tablet TAKE 1 TABLET BY MOUTH AT  BEDTIME 90 tablet 1  . Oxcarbazepine (TRILEPTAL) 300 MG tablet Take 2 tablets (600 mg total) by mouth at bedtime. 180 tablet 1  .  oxybutynin (DITROPAN-XL) 10 MG 24 hr tablet Take 10 mg by mouth daily.     . pantoprazole (PROTONIX) 40 MG tablet TAKE 1 TABLET BY MOUTH  DAILY. TAKE 30 TO 60  MINUTES BEFORE FIRST MEAL  OF THE DAY 90 tablet 3  . potassium chloride SA (K-DUR) 20 MEQ tablet Take 20 mEq by mouth daily.    . pravastatin (PRAVACHOL) 20 MG tablet Take 20 mg by mouth at bedtime.     . pregabalin (LYRICA) 100 MG capsule Take 100 mg by mouth 3 (three) times daily.    Marland Kitchen PROAIR HFA 108 (90 Base) MCG/ACT inhaler Inhale 2 puffs into the lungs every 4 (four) hours as needed for wheezing or shortness of breath. 56 g 1  . Respiratory Therapy Supplies (NEBULIZER) DEVI Use as directed with nebulizer solution. 1 each 1  . Respiratory Therapy Supplies (NEBULIZER/TUBING/MOUTHPIECE) KIT Use as directed with nebulizer machine 1 kit 12  . Semaglutide, 1 MG/DOSE, (OZEMPIC, 1 MG/DOSE,) 2 MG/1.5ML SOPN Take 1 mg by mouth once a week.    . zolpidem (AMBIEN) 10 MG tablet Take 0.5-1 tablets (5-10 mg total) by mouth at bedtime as needed for sleep. 90 tablet 0  . amoxicillin-clavulanate (AUGMENTIN) 875-125 MG tablet Take 1 tablet by mouth 2 (two) times daily for 10 days. 20 tablet 0  . budesonide-formoterol (SYMBICORT)  160-4.5 MCG/ACT inhaler Inhale 2 puffs into the lungs in the morning and at bedtime. 30.6 g 1  . dextromethorphan-guaiFENesin (MUCINEX DM) 30-600 MG 12hr tablet Take 1 tablet by mouth 2 (two) times daily. (Patient not taking: Reported on 04/14/2020) 20 tablet 0  . predniSONE (DELTASONE) 10 MG tablet Take 2 tablets (20 mg total) by mouth 2 (two) times daily with a meal for 3 days, THEN 2 tablets (20 mg total) daily with breakfast for 1 day, THEN 1 tablet (10 mg total) daily with breakfast for 1 day. 15 tablet 0   No current facility-administered medications for this visit.   Allergies: Allergies  Allergen Reactions  . Carvedilol Cough  . Crestor [Rosuvastatin] Cough  . Cymbalta [Duloxetine Hcl] Other (See Comments)    Caused hair loss  . Fish Allergy Itching    Halibut fish  . Morphine And Related Itching  . Rosuvastatin Calcium Other (See Comments) and Cough    Rapid heart rate  . Almond Meal Itching and Swelling    Tongue swells and itches  . Almond Oil Itching and Swelling    Itching and swelling of tongue   . Losartan Potassium Cough   I reviewed her past medical history, social history, family history, and environmental history and no significant changes have been reported from previous visit on 02/05/2020.  Review of Systems  Constitutional: Negative.   HENT:       See HPI  Eyes: Negative.   Respiratory:       See HPI  Cardiovascular: Negative.   Gastrointestinal: Negative.   Musculoskeletal: Negative.   Skin: Negative.   Neurological: Negative.    Objective: Physical Exam Not obtained as encounter was done via telephone.   Previous notes and tests were reviewed.  I discussed the assessment and treatment plan with the patient. The patient was provided an opportunity to ask questions and all were answered. The patient agreed with the plan and demonstrated an understanding of the instructions.   The patient was advised to call back or seek an in-person evaluation if  the symptoms worsen or if the condition fails to improve as anticipated.  I provided 13 minutes of non-face-to-face time during this encounter.  It was my pleasure to participate in [place] care today. Please feel free to contact me with any questions or concerns.   Sincerely,  Palmer Shorey Charmian Muff, MD

## 2020-04-23 NOTE — Telephone Encounter (Signed)
Can she come in today at 1040 for the sick slot or the sick slot for tomorrow?

## 2020-04-23 NOTE — Telephone Encounter (Signed)
Patient called and stated that she has had a rattling cough for the past 2 days follow by drainage and nasal congestion. She states that she has been using her Flonase and Astelin and has been taking her Xyzal daily. Please advise.

## 2020-05-01 ENCOUNTER — Ambulatory Visit
Admission: RE | Admit: 2020-05-01 | Discharge: 2020-05-01 | Disposition: A | Discharge status: Home / Self Care | Payer: 59 | Source: Ambulatory Visit | Attending: Student | Admitting: Student

## 2020-05-01 ENCOUNTER — Other Ambulatory Visit: Payer: 59

## 2020-05-01 ENCOUNTER — Other Ambulatory Visit: Payer: Self-pay

## 2020-05-01 DIAGNOSIS — M5440 Lumbago with sciatica, unspecified side: Secondary | ICD-10-CM

## 2020-05-01 MED ORDER — DIAZEPAM 5 MG PO TABS: 5.0000 mg | ORAL_TABLET | Freq: Once | ORAL | Status: DC

## 2020-05-01 MED ORDER — IOPAMIDOL (ISOVUE-M 200) INJECTION 41%
18.0000 mL | Freq: Once | INTRAMUSCULAR | Status: AC
  Administered 2020-05-01: 18 mL via INTRATHECAL

## 2020-05-01 NOTE — Discharge Instructions (Signed)
Myelogram Discharge Instructions  1. Go home and rest quietly for the next 24 hours.  It is important to lie flat for the next 24 hours.  Get up only to go to the restroom.  You may lie in the bed or on a couch on your back, your stomach, your left side or your right side.  You may have one pillow under your head.  You may have pillows between your knees while you are on your side or under your knees while you are on your back.  2. DO NOT drive today.  Recline the seat as far back as it will go, while still wearing your seat belt, on the way home.  3. You may get up to go to the bathroom as needed.  You may sit up for 10 minutes to eat.  You may resume your normal diet and medications unless otherwise indicated.  Drink lots of extra fluids today and tomorrow.  4. The incidence of headache, nausea, or vomiting is about 5% (one in 20 patients).  If you develop a headache, lie flat and drink plenty of fluids until the headache goes away.  Caffeinated beverages may be helpful.  If you develop severe nausea and vomiting or a headache that does not go away with flat bed rest, call [phone].  5. You may resume normal activities after your 24 hours of bed rest is over; however, do not exert yourself strongly or do any heavy lifting tomorrow. If when you get up you have a headache when standing, go back to bed and force fluids for another 24 hours.  6. Call your physician for a follow-up appointment.  The results of your myelogram will be sent directly to your physician by the following day.  7. If you have any questions or if complications develop after you arrive home, please call [phone].  Discharge instructions have been explained to the patient.  The patient, or the person responsible for the patient, fully understands these instructions.  YOU MAY RESTART YOUR ABILIFY, BUSPAR AND MIRTAZAPINE TOMORROW 05/02/20 AT 09:00AM.

## 2020-05-03 ENCOUNTER — Other Ambulatory Visit: Payer: Self-pay | Admitting: Psychiatry

## 2020-05-03 DIAGNOSIS — F5101 Primary insomnia: Secondary | ICD-10-CM

## 2020-05-05 ENCOUNTER — Ambulatory Visit (INDEPENDENT_AMBULATORY_CARE_PROVIDER_SITE_OTHER): Payer: 59 | Admitting: Psychiatry

## 2020-05-05 ENCOUNTER — Other Ambulatory Visit: Payer: Self-pay

## 2020-05-05 DIAGNOSIS — F419 Anxiety disorder, unspecified: Secondary | ICD-10-CM | POA: Diagnosis not present

## 2020-05-05 NOTE — Progress Notes (Addendum)
      [facility] Counselor/Therapist Progress Note  Patient ID: Chelsea Benson, MRN: [id number],    Date: 05/05/2020  Time Spent: 48 minutes  10:00am to 10:48am  Treatment Type: Individual Therapy  Reported Symptoms: anxiety, "some anger",  Mental Status Exam:  Appearance:   Casual     Behavior:  Appropriate and Sharing  Motor:  Normal  Speech/Language:   Normal Rate  Affect:  anxious  Mood:  anxious  Thought process:  goal directed  Thought content:    WNL  Sensory/Perceptual disturbances:    WNL  Orientation:  oriented to person, place, time/date, situation, day of week, month of year and year  Attention:  Good  Concentration:  Good  Memory:  some forgetfulness and worse under stress  Fund of knowledge:   Good  Insight:    Good  Judgment:   Good  Impulse Control:  Good   Risk Assessment: Danger to Self:  No Self-injurious Behavior: No Danger to Others: No Duty to Warn:no Physical Aggression / Violence:No  Access to Firearms a concern: No  Gang Involvement:No   Subjective: Patient in today reporting anxiety and "some anger" mostly as her brother over situation during holiday weekend "being irresponsible".  Having some back issues and "another test last week."   Interventions: Solution-Oriented/Positive Psychology and Ego-Supportive  Diagnosis:   ICD-10-CM   1. Anxiety disorder, unspecified type  F41.9     Plan: Patient not signing tx goals on computer screen due to COVID.  Treatment Goals:  Goals remain on tx plan as patient works on strategies to meet her goals. Progress will be documented each visit in "Progress" section on Plan.   Long term goal:(measurable) Reduce overall level, frequency, and intensity of the anxiety so that daily functioning is not impaired.* Patient will eventually report a rating of "4"or less" on 1-10 anxiety scale for a5-month time period where her daily functioning is not impaired.  Short term goal: Increase  understanding of beliefs and messages that produce the worry and anxiety.  Strategy: Patient will explore and work to stop cognitive messages that feed anxiety and work to replace them with more positive and empowering messages.   PROGRESS: Patient in today with anxiety, frustration, and some anger mostly directed towards brother "for being irresponsible" and not making good decisions that can affect patient. Having back issues and went for test last week and to hear results this Thursday.  Breast biopsy June 7th. Continues being more focused on monitoring her diabetes reglarly.  Trying to "not let my anxious thoughts get worse" and realize they are not usually based on reality. Difficult for her to challenge the thoughts and change them, but "I do try to calm myself down when I get really anxious.  On 1-10 scale for anxiety, she rates herself today as a "6". She is able sometimes to help decrease her stress and anxiety symptoms by using deep breathing exercises, use my stress ballsl and hand weights, contact with family, good nutrition, as well as more positive self-talk, all of which she is to continue between sessions.   Goal review and progress noted with patient.  Next appt within 3 weeks.   Mathis Fare, LCSW

## 2020-05-05 NOTE — Telephone Encounter (Signed)
Last apt 01/2020 

## 2020-05-07 ENCOUNTER — Other Ambulatory Visit: Payer: 59

## 2020-05-11 ENCOUNTER — Other Ambulatory Visit: Payer: Self-pay | Admitting: Radiology

## 2020-05-12 ENCOUNTER — Other Ambulatory Visit: Payer: Self-pay | Admitting: Student

## 2020-05-12 DIAGNOSIS — M5416 Radiculopathy, lumbar region: Secondary | ICD-10-CM

## 2020-05-18 ENCOUNTER — Other Ambulatory Visit: Payer: Self-pay | Admitting: Allergy

## 2020-05-19 ENCOUNTER — Ambulatory Visit (INDEPENDENT_AMBULATORY_CARE_PROVIDER_SITE_OTHER): Payer: 59 | Admitting: Psychiatry

## 2020-05-19 DIAGNOSIS — F419 Anxiety disorder, unspecified: Secondary | ICD-10-CM

## 2020-05-19 NOTE — Progress Notes (Signed)
[facility] Counselor/Therapist Progress Note  Patient ID: Chelsea Benson, MRN: [id number],    Date: 05/19/2020  Time Spent: 60 minutes   10:am to 11:00am  Virtual Visit via Telephone Note Connected with patient by a video enabled telemedicine/telehealth application or telephone, with their informed consent, and verified patient privacy and that I am speaking with the correct person using two identifiers. I discussed the limitations, risks, security and privacy concerns of performing psychotherapy and management service by telephone and the availability of in person appointments. I also discussed with the patient that there may be a patient responsible charge related to this service. The patient expressed understanding and agreed to proceed. I discussed the treatment planning with the patient. The patient was provided an opportunity to ask questions and all were answered. The patient agreed with the plan and demonstrated an understanding of the instructions. The patient was advised to call  our office if  symptoms worsen or feel they are in a crisis state and need immediate contact.   Therapist Location: [facility] Patient Location: home   Treatment Type: Individual Therapy  Reported Symptoms: anxiety increased, some ongoing physical issues that "she is in touch with her doctors about"; some depression  Mental Status Exam:  Appearance:   n/a   telehealth     Behavior:  Sharing and Motivated  Motor:  n/a  telehealth  Speech/Language:   a bit accelerated due to her anxiety.  As she spoke , the anxiety decreased some.  Affect:  n/a   telehealth  Mood:  anxious and some depression  Thought process:  goal directed  Thought content:    WNL  Sensory/Perceptual disturbances:    WNL  Orientation:  oriented to person, place, time/date, situation, day of week, month of year and year  Attention:  Good  Concentration:  Good  Memory:  "ocassional forgetting"  Fund of  knowledge:   Good  Insight:    Good and Fair  Judgment:   Good/sometimes Fair  Impulse Control:  Fair   Risk Assessment: Danger to Self:  No Self-injurious Behavior: No Danger to Others: No Duty to Warn:no Physical Aggression / Violence:No  Access to Firearms a concern: No  Gang Involvement:No   Subjective: Patient today reports anxiety, some depression, and is dealing with some ongoing health concerns and " is in contact with her doctors."  Interventions: Cognitive Behavioral Therapy, Solution-Oriented/Positive Psychology and Ego-Supportive  Diagnosis:   ICD-10-CM   1. Anxiety disorder, unspecified type  F41.9      Plan: Patient not signing tx goals on computer screen due to [place].  Treatment Goals:  Goals remain on tx plan as patient works on strategies to meet her goals. Progress will be documented each visit in "Progress" section on Plan.   Long term goal:(measurable) Reduce overall level, frequency, and intensity of the anxiety so that daily functioning is not impaired.* Patient will eventually report a rating of "4"or less" on 1-10 anxiety scale for a8-month time period where her daily functioning is not impaired.  Short term goal: Increase understanding of beliefs and messages that produce the worry and anxiety.  Strategy: Patient will explore and work to stop cognitive messages that feed anxiety and work to replace them with more positive and empowering messages.   PROGRESS: Patient today reporting increased anxiety due to some specific stressors, some depression, and health concerns.  Back pain is continuing, but she states she got a ood report from her recent breast biopsy. From  the beginning of conversation, patient was very upset and anxious about a situation with her grandson at work where he was in danger, and also issues relating to other family members who were not being very responsible/careful re: Covid precautions around patient and other  family. Patient talking almost non-stop as she shared details of both of the above concerns and her fears about them.  After venting a lot of her fears and concerns, patient did become calmer and more grounded. Realizing how the behaviors of certain family members can easily upset her and we discussed ways for her to have some healthier boundaries within the family. Reports on a 1-10 anxiety scale more recently that she's had some times with family where she would rate herself a "10", but today (after venting) she rates herself as a "6". Reviewed strategies that have helped her manage/prevent elevated anxiety including: walking, talk to myself in calm manner, use hand weights, encouraging self-talk, *Being careful not to call the wrong people as they will make my anxiety worse", and slow deep breathing exercises. Some good insight on setting limits with others.    Goal review and progress noted with patient.  Next appt within 2 weeks.     Mathis Fare, LCSW

## 2020-05-27 ENCOUNTER — Other Ambulatory Visit: Payer: Self-pay | Admitting: Allergy

## 2020-05-27 ENCOUNTER — Other Ambulatory Visit: Payer: Self-pay | Admitting: Psychiatry

## 2020-05-27 DIAGNOSIS — F419 Anxiety disorder, unspecified: Secondary | ICD-10-CM

## 2020-05-27 DIAGNOSIS — J4540 Moderate persistent asthma, uncomplicated: Secondary | ICD-10-CM

## 2020-05-27 DIAGNOSIS — Z91018 Allergy to other foods: Secondary | ICD-10-CM

## 2020-05-27 DIAGNOSIS — F5101 Primary insomnia: Secondary | ICD-10-CM

## 2020-05-27 NOTE — Telephone Encounter (Signed)
Last apt 02/21 due back 3 months

## 2020-05-29 ENCOUNTER — Other Ambulatory Visit: Payer: Self-pay | Admitting: Student

## 2020-05-29 ENCOUNTER — Ambulatory Visit
Admission: RE | Admit: 2020-05-29 | Discharge: 2020-05-29 | Disposition: A | Discharge status: Home / Self Care | Payer: 59 | Source: Ambulatory Visit | Attending: Student | Admitting: Student

## 2020-05-29 DIAGNOSIS — M5416 Radiculopathy, lumbar region: Secondary | ICD-10-CM

## 2020-05-29 MED ORDER — METHYLPREDNISOLONE ACETATE 40 MG/ML INJ SUSP (RADIOLOG: 120.0000 mg | Freq: Once | INTRAMUSCULAR | Status: DC

## 2020-05-29 NOTE — Discharge Instructions (Signed)

## 2020-06-02 ENCOUNTER — Other Ambulatory Visit: Payer: Self-pay

## 2020-06-02 ENCOUNTER — Ambulatory Visit (INDEPENDENT_AMBULATORY_CARE_PROVIDER_SITE_OTHER): Payer: 59 | Admitting: Psychiatry

## 2020-06-02 DIAGNOSIS — F419 Anxiety disorder, unspecified: Secondary | ICD-10-CM

## 2020-06-02 NOTE — Progress Notes (Signed)
      [facility] Counselor/Therapist Progress Note  Patient ID: Chelsea Benson, MRN: [id number],    Date: 06/02/2020  Time Spent:  60 minutes   10:00am to 11:00am  Treatment Type: Individual Therapy  Reported Symptoms: anxious.  *Patient reports she is now allergic to a med used in recent back injection," Dpomedrol", per patient. States she is communicating this with her med providers.   Mental Status Exam:  Appearance:   Casual     Behavior:  Appropriate, Sharing and Motivated  Motor:  using her walker well  Speech/Language:   Normal Rate  Affect:  anxious  Mood:  anxious  Thought process:  goal directed  Thought content:    WNL  Sensory/Perceptual disturbances:    WNL  Orientation:  oriented to person, place, time/date, situation, day of week, month of year and year  Attention:  Good  Concentration:  Good and Fair  Memory:  some forgetfulness  Fund of knowledge:   Good  Insight:    Good and Fair  Judgment:   Good and Fair  Impulse Control:  Good and Fair   Risk Assessment: Danger to Self:  No Self-injurious Behavior: No Danger to Others: No Duty to Warn:no Physical Aggression / Violence:No  Access to Firearms a concern: No  Gang Involvement:No   Subjective: Patient today reporting anxiety as primary symptom.   Interventions: Cognitive Behavioral Therapy and Solution-Oriented/Positive Psychology  Diagnosis:   ICD-10-CM   1. Anxiety disorder, unspecified type  F41.9      Plan: Patient not signing tx goals on computer screen due to COVID.  Treatment Goals:  Goals remain on tx plan as patient works on strategies to meet her goals. Progress will be documented each visit in "Progress" section on Plan.   Long term goal:(measurable) Reduce overall level, frequency, and intensity of the anxiety so that daily functioning is not impaired.* Patient will eventually report a rating of "4"or less" on 1-10 anxiety scale for a10-month time period where her  daily functioning is not impaired.  Short term goal: Increase understanding of beliefs and messages that produce the worry and anxiety.  Strategy: Patient will explore and work to stop cognitive messages that feed anxiety and work to replace them with more positive and empowering messages.   PROGRESS: Patient in today reporting anxiety, and "other previous symptoms are better."  Anxiety is more related to her recent health issues, family situations, and easily annoyed/anxious when things don't always go as planned. Following up from recent back injections and treatment, patient is feeling better. Still very sensitive to Covid precautions and very frustrated when all people are not wearing masks now. Issues within family about brother who has refused the vaccine and still lives in patient's home which concerns patient. Still encouraging patient to have healthier boundaries with family as she can be easily irritated and upset by their behaviors and attitudes.  Today rates herself an "8" on 1-10 anxiety scale, although is calmer and more grounded after talking through her concerns earlier. Did review with patient some anxiety/stress management skills including deep breathing exercises, walking around her house, using my stress balls and hand weights, and making my self-talk more positive and calming, while limiting exposure to people that are not healthy for her.  Goal review and progress noted with patient.  Next appt within 2-3 weeks.   Mathis Fare, LCSW

## 2020-06-10 ENCOUNTER — Ambulatory Visit (INDEPENDENT_AMBULATORY_CARE_PROVIDER_SITE_OTHER): Payer: 59 | Admitting: Allergy

## 2020-06-10 ENCOUNTER — Other Ambulatory Visit: Payer: Self-pay

## 2020-06-10 ENCOUNTER — Encounter: Payer: Self-pay | Admitting: Allergy

## 2020-06-10 DIAGNOSIS — J4540 Moderate persistent asthma, uncomplicated: Secondary | ICD-10-CM

## 2020-06-10 DIAGNOSIS — T50905D Adverse effect of unspecified drugs, medicaments and biological substances, subsequent encounter: Secondary | ICD-10-CM

## 2020-06-10 DIAGNOSIS — H1013 Acute atopic conjunctivitis, bilateral: Secondary | ICD-10-CM

## 2020-06-10 DIAGNOSIS — T7800XD Anaphylactic reaction due to unspecified food, subsequent encounter: Secondary | ICD-10-CM

## 2020-06-10 DIAGNOSIS — J3089 Other allergic rhinitis: Secondary | ICD-10-CM

## 2020-06-10 DIAGNOSIS — L2089 Other atopic dermatitis: Secondary | ICD-10-CM

## 2020-06-10 NOTE — Progress Notes (Deleted)
Follow-up Note  RE: Chelsea Benson MRN: [id number] DOB: August 26, 1951 Date of Office Visit: 06/10/2020   History of present illness: Chelsea Benson is a 69 y.o. female presenting today for follow-up of ***  {Blank single:19197::"Relevant historical results: ***"," "}  Review of systems: ROS  {Blank single:19197::" ","All other systems negative unless noted above in HPI"}  Past medical/social/surgical/family history have been reviewed and are unchanged unless specifically indicated below.  {Blank single:19197::"No changes"}  Medication List: Current Outpatient Medications  Medication Sig Dispense Refill   ACCU-CHEK GUIDE test strip TEST BID PRN  1   albuterol (PROVENTIL) (2.5 MG/3ML) 0.083% nebulizer solution USE 1 VIAL VIA NEBULIZER EVERY 4 HRS AS NEEDED FOR WHEEZING OR SHORTNESS OF BREATH. MANUFACTURER RECOMMENDS NOT EXCEEDING 4 VIALS PER DAY 270 mL 1   ARIPiprazole (ABILIFY) 2 MG tablet Take 1 tablet (2 mg total) by mouth daily. 90 tablet 1   aspirin EC 81 MG tablet Take 81 mg by mouth daily.     azelastine (ASTELIN) 0.1 % nasal spray Place 1 spray into both nostrils 2 (two) times daily. 90 mL 1   azelastine (OPTIVAR) 0.05 % ophthalmic solution INSTILL 1 DROP INTO BOTH  EYES TWICE DAILY 12 mL 5   Blood Glucose Monitoring Suppl (ACCU-CHEK GUIDE) w/Device KIT See admin instructions.  1   budesonide-formoterol (SYMBICORT) 160-4.5 MCG/ACT inhaler Inhale 2 puffs into the lungs in the morning and at bedtime. 30.6 g 1   busPIRone (BUSPAR) 15 MG tablet TAKE 1 AND 1/2 TABLETS BY  MOUTH TWICE DAILY 270 tablet 0   CLOBETASOL PROPIONATE E 0.05 % emollient cream Apply 1 application topically 2 (two) times daily as needed (rash).      desonide (DESOWEN) 0.05 % ointment APPLY TO AFFECTED AREA(S)  TOPICALLY TWICE DAILY AS  NEEDED 180 g 1   diltiazem (CARDIZEM CD) 180 MG 24 hr capsule Take 180 mg by mouth daily.      EPINEPHRINE 0.3 mg/0.3 mL IJ SOAJ injection INJECT CONTENTS  OF 1 PEN AS NEEDED FOR ALLERGIC  RESPONSE AS DIRECTED BY MD. SEEK MEDICAL ATTENTION  AFTER USE. 4 each 1   famotidine (PEPCID) 20 MG tablet TAKE 1 TABLET BY MOUTH  DAILY AFTER SUPPER. 90 tablet 3   fluticasone (FLONASE) 50 MCG/ACT nasal spray USE 2 SPRAYS IN BOTH  NOSTRILS DAILY (Patient taking differently: 2 sprays 2 (two) times daily. ) 48 g 1   hydrochlorothiazide (HYDRODIURIL) 25 MG tablet Take 25 mg by mouth daily.      hydrOXYzine (ATARAX/VISTARIL) 25 MG tablet TAKE 1 TABLET BY MOUTH  EVERY 6 HOURS AS NEEDED 180 tablet 0   levocetirizine (XYZAL) 5 MG tablet TAKE 1 TABLET BY MOUTH  DAILY 90 tablet 1   Mepolizumab (NUCALA) 100 MG/ML SOAJ Inject 100 mg into the skin every 28 (twenty-eight) days. 0.84 mL 11   metFORMIN (GLUCOPHAGE-XR) 500 MG 24 hr tablet Take 500 mg by mouth 1 day or 1 dose.      mirtazapine (REMERON) 30 MG tablet TAKE 1 TABLET BY MOUTH AT  BEDTIME 90 tablet 3   montelukast (SINGULAIR) 10 MG tablet TAKE 1 TABLET BY MOUTH AT  BEDTIME 90 tablet 3   Oxcarbazepine (TRILEPTAL) 300 MG tablet Take 2 tablets (600 mg total) by mouth at bedtime. 180 tablet 1   oxybutynin (DITROPAN-XL) 10 MG 24 hr tablet Take 10 mg by mouth daily.      pantoprazole (PROTONIX) 40 MG tablet TAKE 1 TABLET BY MOUTH  DAILY. TAKE  30 TO 60  MINUTES BEFORE FIRST MEAL  OF THE DAY 90 tablet 3   potassium chloride SA (K-DUR) 20 MEQ tablet Take 20 mEq by mouth daily.     pravastatin (PRAVACHOL) 20 MG tablet Take 20 mg by mouth at bedtime.      pregabalin (LYRICA) 100 MG capsule Take 100 mg by mouth 3 (three) times daily.     PROAIR HFA 108 (90 Base) MCG/ACT inhaler Inhale 2 puffs into the lungs every 4 (four) hours as needed for wheezing or shortness of breath. 56 g 1   Respiratory Therapy Supplies (NEBULIZER) DEVI Use as directed with nebulizer solution. 1 each 1   Respiratory Therapy Supplies (NEBULIZER/TUBING/MOUTHPIECE) KIT Use as directed with nebulizer machine 1 kit 12   Semaglutide, 1  MG/DOSE, (OZEMPIC, 1 MG/DOSE,) 2 MG/1.5ML SOPN Take 1 mg by mouth once a week.     zolpidem (AMBIEN) 10 MG tablet TAKE 1/2 TO 1 TABLET BY  MOUTH AT BEDTIME AS NEEDED  FOR SLEEP 90 tablet 0   albuterol (VENTOLIN HFA) 108 (90 Base) MCG/ACT inhaler Inhale 2 puffs into the lungs every 4 (four) hours as needed. (Patient not taking: Reported on 06/10/2020) 54 g 1   No current facility-administered medications for this visit.     Known medication allergies: Allergies  Allergen Reactions   Cymbalta [Duloxetine Hcl] Other (See Comments)    Caused hair loss   Depo-Medrol [Methylprednisolone Acetate] Hives    05/29/20 developed hives "all over my back" after MBB; resolved with Benadryl and "a few days."  Can tolerate Dexamethasone/Decadron.   Rosuvastatin Calcium Other (See Comments) and Cough    Rapid heart rate   Almond Meal Itching and Swelling    Tongue swells and itches   Almond Oil Itching and Swelling    Itching and swelling of tongue    Carvedilol Cough   Crestor [Rosuvastatin] Cough   Fish Allergy Itching    Halibut fish   Losartan Potassium Cough   Morphine And Related Itching     Physical examination: There were no vitals taken for this visit.  General: Alert, interactive, in no acute distress. HEENT: TMs pearly gray, turbinates {Blank single:19197::"non-edematous","edematous","edematous and pale","markedly edematous","markedly edematous and pale","moderately edematous","mildly edematous","minimally edematous"} {Blank single:19197::"with crusty discharge","with thick discharge","with clear discharge","without discharge"}, post-pharynx {Blank single:19197::"unremarkable","non erythematous","erythematous","markedly erythematous","moderately erythematous","mildly erythematous"}. Neck: Supple without lymphadenopathy. Lungs: {Blank single:19197::"Decreased breath sounds with expiratory wheezing bilaterally","Mildly decreased breath sounds with expiratory wheezing  bilaterally","Decreased breath sounds bilaterally without wheezing, rhonchi or rales","Mildly decreased breath sounds bilaterally without wheezing, rhonchi or rales","Clear to auscultation without wheezing, rhonchi or rales"}. {{Blank single:19197::"increased work of breathing","no increased work of breathing"}. CV: Normal S1, S2 without murmurs. Abdomen: Nondistended, nontender. Skin: {Blank single:19197::"Dry, erythematous, excoriated patches on the ***","Dry, hyperpigmented, thickened patches on the ***","Dry, mildly hyperpigmented, mildly thickened patches on the ***","Scattered erythematous urticarial type lesions primarily located *** , nonvesicular","Warm and dry, without lesions or rashes"}. Extremities:  No clubbing, cyanosis or edema. Neuro:   Grossly intact.  Diagnositics/Labs: Labs: ***  Spirometry: {Blank single:19197::"results normal","FEV1: ***, FVC: ***, ratio consistent with ***"}  Allergy testing: *** Allergy testing results were read and interpreted by provider, documented by clinical staff.   Assessment and plan: There are no Patient Instructions on file for this visit.  No follow-ups on file.  I appreciate the opportunity to take part in Lucelia's care. Please do not hesitate to contact me with questions.  Sincerely,   Prudy Feeler, MD Allergy/Immunology [facility] [place]

## 2020-06-11 MED ORDER — BUDESONIDE-FORMOTEROL FUMARATE 160-4.5 MCG/ACT IN AERO: 2.0000 | INHALATION_SPRAY | Freq: Two times a day (BID) | RESPIRATORY_TRACT | 1 refills | Status: DC

## 2020-06-11 MED ORDER — AZELASTINE HCL 0.1 % NA SOLN: 1.0000 | Freq: Two times a day (BID) | NASAL | 1 refills | Status: DC

## 2020-06-11 MED ORDER — FLUTICASONE PROPIONATE 50 MCG/ACT NA SUSP: NASAL | 1 refills | Status: DC

## 2020-06-11 MED ORDER — LEVOCETIRIZINE DIHYDROCHLORIDE 5 MG PO TABS: 5.0000 mg | ORAL_TABLET | Freq: Two times a day (BID) | ORAL | 1 refills | Status: DC

## 2020-06-11 NOTE — Patient Instructions (Addendum)
Allergic rhinitis with conjunctivitis - Continue Flonase 2 sprays daily as needed.  Use flonase for 1-2 weeks at a time before stopping once symptoms improve. - Continue Astelin nasal spray 2 sprays in each nostril twice a day as needed for nasal drainage/post-nasal drip - Continue saline nasal rinses once a day for nasal symptoms. Use before nasal sprays  -Continue Xyzal 5 mg at this time (may take additional dose if needed to control allergy symptoms) - Continue Singulair 10 mg at bedtime -Continue Optivar 1 drop in each twice a day as needed for watery, itchy, red eyes - Continue allergen avoidance measures   Moderate persistent asthma - Continue Symbicort 160 - 2 puffs twice daily - Continue singulair as above  - have access to albuterol inhaler 2 puffs every 4-6 hours as needed for cough/wheeze/shortness of breath/chest tightness.  May use 15-20 minutes prior to activity.   Monitor frequency of use.   -Continue Nucala monthly autoinjector to allow for ease of home administration.  Daugther is giving her injections once a month.    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 - Carry EpiPen at all times.   Flexural atopic dermatitis - Continue moisturizing routine with Aveeno - Use triamcinolone 0.1% below the face.   Adverse effect of drug  -Development of hives following injection of Depo-Medrol to the back. I have advised that she avoid this particular steroid in the future. It is uncommon that 1 will have reaction to a different steroid basis. As mentioned she has tolerated cortisone and Kenalog injections in the past however they have not been effective for her back pain management. She also tolerates prednisone also without any issue. Thus if there are any other steroid bases that are used as injections then will recommend she try a different option.  Follow up in 3-4 or sooner if needed.

## 2020-06-11 NOTE — Progress Notes (Signed)
RE: Chelsea Benson MRN: [id number] DOB: 1951-05-05 Date of Telemedicine Visit: 06/10/2020  Referring provider: Kathyrn Lass, MD Primary care provider: Marda Stalker, PA-C  Chief Complaint: Asthma (No recent flare.) and Medication Management   Telemedicine Follow Up Visit via Telephone: I connected with Chelsea Benson for a follow up on 06/11/20 by telephone and verified that I am speaking with the correct person using two identifiers.   I discussed the limitations, risks, security and privacy concerns of performing an evaluation and management service by telephone and the availability of in person appointments. I also discussed with the patient that there may be a patient responsible charge related to this service. The patient expressed understanding and agreed to proceed.  Patient is at home.  Provider is at the office.  Visit start time: 4193 Visit end time: [place] consent/check in by: Chelsea Benson Medical consent and medical assistant/nurse: Chelsea Benson  History of Present Illness: She is a 69 y.o. female, who is being followed for asthma, allergic rhinitis with conjunctivitis, food allergy and eczema. At her last visit which was a telemedicine visit she had a sinusitis which she does have history of recurrent sinusitis. She was treated with Augmentin course and prednisone as she states her symptoms cleared up with this regimen. She states she has been doing well since then. She has not had any issues with her asthma or need to use her rescue inhaler. She continues on her monthly Nucala that is administered by her daughter at home. She continues to tolerate the injections well. She continues on her Symbicort 160 mcg 2 puffs twice a day as well as Singulair daily. She denies any nighttime awakenings. She states with her allergies that her symptoms have been pretty under control. She has not been going anywhere much. She does use her Astelin and Flonase for drainage and congestion  respectively. She continues on her Xyzal and and does take additional doses if she does have additional allergy symptoms. She does use her Optivar as needed for eye symptoms. She continues to avoid almonds without any accidental ingestions or need to use her epinephrine device. She states she has not had any issues with her eczema. Of note she recently got a injection of Depo-Medrol for her back pain. She states she has had cortisone and Kenalog injections before for her back pain but these have not been very helpful. Thus she states the escalated up to Depo-Medrol. After the injection she states she developed a rash on her back that she states were hives and states that the hives were on the entirety of her back. She states the hives and itching were present for about a week after the injection. She states this was the first time she has ever had a Depo-Medrol injection. She states she did take Benadryl which was helpful.  Assessment and Plan: Chelsea Benson is a 69 y.o. female with:     Allergic rhinitis with conjunctivitis - Continue Flonase 2 sprays daily as needed.  Use flonase for 1-2 weeks at a time before stopping once symptoms improve. - Continue Astelin nasal spray 2 sprays in each nostril twice a day as needed for nasal drainage/post-nasal drip - Continue saline nasal rinses once a day for nasal symptoms. Use before nasal sprays  -Continue Xyzal 5 mg at this time (may take additional dose if needed to control allergy symptoms) - Continue Singulair 10 mg at bedtime -Continue Optivar 1 drop in each twice a day as needed for watery, itchy, red  eyes - Continue allergen avoidance measures   Moderate persistent asthma - Continue Symbicort 160 - 2 puffs twice daily - Continue singulair as above  - have access to albuterol inhaler 2 puffs every 4-6 hours as needed for cough/wheeze/shortness of breath/chest tightness.  May use 15-20 minutes prior to activity.   Monitor frequency of use.   -Continue  Nucala monthly autoinjector to allow for ease of home administration.  Daugther is giving her injections once a month.    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 - Carry EpiPen at all times.   Flexural atopic dermatitis - Continue moisturizing routine with Aveeno - Use triamcinolone 0.1% below the face.   Adverse effect of drug  -Development of hives following injection of Depo-Medrol to the back. I have advised that she avoid this particular steroid in the future. It is uncommon that 1 will have reaction to a different steroid basis. As mentioned she has tolerated cortisone and Kenalog injections in the past however they have not been effective for her back pain management. She also tolerates prednisone also without any issue. Thus if there are any other steroid bases that are used as injections then will recommend she try a different option.  Follow up in 3-4 months or sooner if needed.    Diagnostics: None.  Medication List:  Current Outpatient Medications  Medication Sig Dispense Refill  . ACCU-CHEK GUIDE test strip TEST BID PRN  1  . albuterol (PROVENTIL) (2.5 MG/3ML) 0.083% nebulizer solution USE 1 VIAL VIA NEBULIZER EVERY 4 HRS AS NEEDED FOR WHEEZING OR SHORTNESS OF BREATH. MANUFACTURER RECOMMENDS NOT EXCEEDING 4 VIALS PER DAY 270 mL 1  . ARIPiprazole (ABILIFY) 2 MG tablet Take 1 tablet (2 mg total) by mouth daily. 90 tablet 1  . aspirin EC 81 MG tablet Take 81 mg by mouth daily.    Marland Kitchen azelastine (ASTELIN) 0.1 % nasal spray Place 1 spray into both nostrils 2 (two) times daily. 90 mL 1  . azelastine (OPTIVAR) 0.05 % ophthalmic solution INSTILL 1 DROP INTO BOTH  EYES TWICE DAILY 12 mL 5  . Blood Glucose Monitoring Suppl (ACCU-CHEK GUIDE) w/Device KIT See admin instructions.  1  . budesonide-formoterol (SYMBICORT) 160-4.5 MCG/ACT inhaler Inhale 2 puffs into the lungs in the morning and at bedtime. 30.6 g 1  . busPIRone (BUSPAR) 15 MG tablet TAKE 1 AND 1/2 TABLETS BY  MOUTH  TWICE DAILY 270 tablet 0  . CLOBETASOL PROPIONATE E 0.05 % emollient cream Apply 1 application topically 2 (two) times daily as needed (rash).     Marland Kitchen desonide (DESOWEN) 0.05 % ointment APPLY TO AFFECTED AREA(S)  TOPICALLY TWICE DAILY AS  NEEDED 180 g 1  . diltiazem (CARDIZEM CD) 180 MG 24 hr capsule Take 180 mg by mouth daily.     Marland Kitchen EPINEPHRINE 0.3 mg/0.3 mL IJ SOAJ injection INJECT CONTENTS OF 1 PEN AS NEEDED FOR ALLERGIC  RESPONSE AS DIRECTED BY MD. SEEK MEDICAL ATTENTION  AFTER USE. 4 each 1  . famotidine (PEPCID) 20 MG tablet TAKE 1 TABLET BY MOUTH  DAILY AFTER SUPPER. 90 tablet 3  . fluticasone (FLONASE) 50 MCG/ACT nasal spray USE 2 SPRAYS IN BOTH  NOSTRILS DAILY 48 g 1  . hydrochlorothiazide (HYDRODIURIL) 25 MG tablet Take 25 mg by mouth daily.     . hydrOXYzine (ATARAX/VISTARIL) 25 MG tablet TAKE 1 TABLET BY MOUTH  EVERY 6 HOURS AS NEEDED 180 tablet 0  . levocetirizine (XYZAL) 5 MG tablet Take 1 tablet (5 mg  total) by mouth in the morning and at bedtime. 180 tablet 1  . Mepolizumab (NUCALA) 100 MG/ML SOAJ Inject 100 mg into the skin every 28 (twenty-eight) days. 0.84 mL 11  . metFORMIN (GLUCOPHAGE-XR) 500 MG 24 hr tablet Take 500 mg by mouth 1 day or 1 dose.     . mirtazapine (REMERON) 30 MG tablet TAKE 1 TABLET BY MOUTH AT  BEDTIME 90 tablet 3  . montelukast (SINGULAIR) 10 MG tablet TAKE 1 TABLET BY MOUTH AT  BEDTIME 90 tablet 3  . Oxcarbazepine (TRILEPTAL) 300 MG tablet Take 2 tablets (600 mg total) by mouth at bedtime. 180 tablet 1  . oxybutynin (DITROPAN-XL) 10 MG 24 hr tablet Take 10 mg by mouth daily.     . pantoprazole (PROTONIX) 40 MG tablet TAKE 1 TABLET BY MOUTH  DAILY. TAKE 30 TO 60  MINUTES BEFORE FIRST MEAL  OF THE DAY 90 tablet 3  . potassium chloride SA (K-DUR) 20 MEQ tablet Take 20 mEq by mouth daily.    . pravastatin (PRAVACHOL) 20 MG tablet Take 20 mg by mouth at bedtime.     . pregabalin (LYRICA) 100 MG capsule Take 100 mg by mouth 3 (three) times daily.    Marland Kitchen PROAIR HFA  108 (90 Base) MCG/ACT inhaler Inhale 2 puffs into the lungs every 4 (four) hours as needed for wheezing or shortness of breath. 56 g 1  . Respiratory Therapy Supplies (NEBULIZER) DEVI Use as directed with nebulizer solution. 1 each 1  . Respiratory Therapy Supplies (NEBULIZER/TUBING/MOUTHPIECE) KIT Use as directed with nebulizer machine 1 kit 12  . Semaglutide, 1 MG/DOSE, (OZEMPIC, 1 MG/DOSE,) 2 MG/1.5ML SOPN Take 1 mg by mouth once a week.    . zolpidem (AMBIEN) 10 MG tablet TAKE 1/2 TO 1 TABLET BY  MOUTH AT BEDTIME AS NEEDED  FOR SLEEP 90 tablet 0  . albuterol (VENTOLIN HFA) 108 (90 Base) MCG/ACT inhaler Inhale 2 puffs into the lungs every 4 (four) hours as needed. (Patient not taking: Reported on 06/10/2020) 54 g 1   No current facility-administered medications for this visit.   Allergies: Allergies  Allergen Reactions  . Cymbalta [Duloxetine Hcl] Other (See Comments)    Caused hair loss  . Depo-Medrol [Methylprednisolone Acetate] Hives    05/29/20 developed hives "all over my back" after MBB; resolved with Benadryl and "a few days."  Can tolerate Dexamethasone/Decadron.  . Rosuvastatin Calcium Other (See Comments) and Cough    Rapid heart rate  . Almond Meal Itching and Swelling    Tongue swells and itches  . Almond Oil Itching and Swelling    Itching and swelling of tongue   . Carvedilol Cough  . Crestor [Rosuvastatin] Cough  . Fish Allergy Itching    Halibut fish  . Losartan Potassium Cough  . Morphine And Related Itching   I reviewed her past medical history, social history, family history, and environmental history and no significant changes have been reported from previous visit on 04/23/20.  Review of Systems  Constitutional: Negative.   HENT: Negative.   Eyes: Negative.   Respiratory: Negative.   Cardiovascular: Negative.   Gastrointestinal: Negative.   Musculoskeletal: Positive for back pain.  Neurological: Negative.    Objective: Physical Exam Not obtained as  encounter was done via telephone.   Previous notes and tests were reviewed.  I discussed the assessment and treatment plan with the patient. The patient was provided an opportunity to ask questions and all were answered. The patient agreed with the  plan and demonstrated an understanding of the instructions.   The patient was advised to call back or seek an in-person evaluation if the symptoms worsen or if the condition fails to improve as anticipated.  I provided 20 minutes of non-face-to-face time during this encounter.  It was my pleasure to participate in Victoria Vera care today. Please feel free to contact me with any questions or concerns.   Sincerely,  Abbagail Scaff Charmian Muff, MD

## 2020-06-16 ENCOUNTER — Other Ambulatory Visit: Payer: Self-pay

## 2020-06-16 ENCOUNTER — Ambulatory Visit (INDEPENDENT_AMBULATORY_CARE_PROVIDER_SITE_OTHER): Payer: 59 | Admitting: Psychiatry

## 2020-06-16 DIAGNOSIS — F419 Anxiety disorder, unspecified: Secondary | ICD-10-CM

## 2020-06-16 NOTE — Progress Notes (Signed)
[facility] Counselor/Therapist Progress Note  Patient ID: Chelsea Benson, MRN: [id number],    Date: 06/16/2020  Time Spent: 60 minutes   10:00am to 11:00am  Treatment Type: Individual Therapy  Reported Symptoms: anxiety, frustrations  Mental Status Exam:  Appearance:   Casual     Behavior:  Appropriate, Sharing and Motivated  Motor:  Normal  Speech/Language:   Normal Rate  Affect:  anxious  Mood:  anxious and some depression (situational re: brother-in-law cancer)  Thought process:  goal directed  Thought content:    WNL  Sensory/Perceptual disturbances:    WNL  Orientation:  oriented to person, place, time/date, situation, day of week, month of year and year  Attention:  Good  Concentration:  Fair  Memory:  "some forgetfulness" worse under stress; later reports some increase "of my forgetfulness" more recently  [employer] of knowledge:   Good  Insight:    Good and Fair  Judgment:   Good and Fair  Impulse Control:  Good and Fair   Risk Assessment: Danger to Self:  No Self-injurious Behavior: No Danger to Others: No Duty to Warn:no Physical Aggression / Violence:No  Access to Firearms a concern: No  Gang Involvement:No   Subjective: Patient today reporting anxiety, frustrations.  Also later in session shared some increased forgetfulness recently and is seeing C. Rolland Porter, PA-C at [facility] next week about this.   Interventions: Solution-Oriented/Positive Psychology  Diagnosis:   ICD-10-CM   1. Anxiety disorder, unspecified type  F41.9     Plan: Patient not signing tx goals on computer screen due to [place].  Treatment Goals:  Goals remain on tx plan as patient works on strategies to meet her goals. Progress will be documented each visit in "Progress" section on Plan.   Long term goal:(measurable) Reduce overall level, frequency, and intensity of the anxiety so that daily functioning is not impaired.* Patient will eventually report a rating of  "4"or less" on 1-10 anxiety scale for a82-monthtime period where her daily functioning is not impaired.  Short term goal: Increase understanding of beliefs and messages that produce the worry and anxiety.  Strategy: Patient will explore and work to stop cognitive messages that feed anxiety and work to replace them with more positive and empowering messages.   PROGRESS: Patient in today reporting anxiety and frustrations related to personal and family issues, in addition to some lingering Covid concerns. "I did finally convince my brother that lives in my house to get a Covid shot" and is glad about that. Patient easily upset at times about her own health or when others call her and discuss their own health issues. [place] say her husband has been diagnosed with colon cancer. Family and friends often call patient "when something happens or goes wrong."  Processed this some with patient as well as how to set healthy boundaries with people so that patient's needs are also met.  Towards appt end, she reports her anxiety today is at a "5" on 1-10 scale which is down some from last appt when it was an "8". Encouraged good self-care including the boundaries mentioned above and using her deep breathing exercises, using her stress balls and hand weights, contact with people who are supportive, and more positive self-talk.   **Patient adds that her forgetfulness is getting worse more recently and [place] [facility] [facility] [place].  Goal review and progress noted  with patient.  Next appt within 3 weeks.   Shanon Ace, LCSW

## 2020-06-17 ENCOUNTER — Encounter: Payer: Self-pay | Admitting: Cardiology

## 2020-06-17 ENCOUNTER — Telehealth: Payer: Self-pay | Admitting: Cardiology

## 2020-06-17 ENCOUNTER — Ambulatory Visit (INDEPENDENT_AMBULATORY_CARE_PROVIDER_SITE_OTHER): Payer: 59 | Admitting: Cardiology

## 2020-06-17 VITALS — BP 118/64 | HR 88 | Ht 64.0 in | Wt 175.8 lb

## 2020-06-17 DIAGNOSIS — R002 Palpitations: Secondary | ICD-10-CM | POA: Insufficient documentation

## 2020-06-17 DIAGNOSIS — I10 Essential (primary) hypertension: Secondary | ICD-10-CM | POA: Diagnosis not present

## 2020-06-17 DIAGNOSIS — R079 Chest pain, unspecified: Secondary | ICD-10-CM | POA: Diagnosis not present

## 2020-06-17 DIAGNOSIS — R071 Chest pain on breathing: Secondary | ICD-10-CM | POA: Insufficient documentation

## 2020-06-17 DIAGNOSIS — E785 Hyperlipidemia, unspecified: Secondary | ICD-10-CM

## 2020-06-17 DIAGNOSIS — J4541 Moderate persistent asthma with (acute) exacerbation: Secondary | ICD-10-CM

## 2020-06-17 MED ORDER — METOPROLOL TARTRATE 50 MG PO TABS
100.0000 mg | ORAL_TABLET | Freq: Once | ORAL | 0 refills | Status: DC
Start: 2020-06-17 — End: 2020-11-23

## 2020-06-17 NOTE — Patient Instructions (Signed)
Medication Instructions:  See instructions below   no there changes  *If you need a refill on our cardiac medications before your next appointment, please call your pharmacy*   Lab Work: See instructions  Below for BMP  If you have labs (blood work) drawn today and your tests are completely normal, you will receive your results only by: Marland Kitchen MyChart Message (if you have MyChart) OR . A paper copy in the mail If you have any lab test that is abnormal or we need to change your treatment, we will call you to review the results.   Testing/Procedures: Will be schedule [facility] radiology dept [phone] - once authorization is obtained  Your physician has requested that you have cardiac CTa. Cardiac computed tomography (CT) is a painless test that uses an x-ray machine to take clear, detailed pictures of your heart.  Please follow instruction sheet as given.     Follow-Up: At [facility], you and your health needs are our priority.  As part of our continuing mission to provide you with exceptional heart care, we have created designated Provider Care Teams.  These Care Teams include your primary Cardiologist (physician) and Advanced Practice Providers (APPs -  Physician Assistants and Nurse Practitioners) who all work together to provide you with the care you need, when you need it.  We recommend signing up for the patient portal called "MyChart".  Sign up information is provided on this After Visit Summary.  MyChart is used to connect with patients for Virtual Visits (Telemedicine).  Patients are able to view lab/test results, encounter notes, upcoming appointments, etc.  Non-urgent messages can be sent to your provider as well.   To learn more about what you can do with MyChart, go to [email or web address].    Your next appointment:   2 month(s)  The format for your next appointment:   In Person  Provider:   Glenetta Hew, MD   Other Instructions Recommendations for vagal  maneuvers:for when you have the fast heartrates  "Bearing down"  Coughing  Gagging  Icy, cold towel on face or drink ice cold water   DR HARDING RECOMMENDS THAT YOU  PURCHASES  " Kardia" By [employer]. FROM THE  [employer]  APP PLAY STORE.   THE APP IS FREE , BUT THE  EQUIPMENT HAS A COST. IT IS A WAY FOR YOU TO OBTAIN A RECORDING OF YOUR HEART RATE . IT WILL BE A SHORT RHYTHM  STRIP THAT YOU CAN SHOW TO MEDICAL STAFF.  ALSO , YOU MAY [place] - EKG- MONITOR YOU ARE ABLE TO KEEP RECORDINGS ON YOUR SMARTPHONE , INSTEAD OF PURCHASING A SEPARATE EQUIPMENT   Dr Ellyn Hack  recommends portable  ECG monitor  to capture heart rate reading. There is a monitor that can be purchased on [employer]. The  monitor name is "EMAY"  at last checked it cost $79.  Our understanding there no more cost of that purchasing the machine. You will need a computer to be able to upload your recording and to be printed. It also come with a  USB to charge the monitor.   Your cardiac CT will be scheduled at  the below location:   [facility] [address] [place], [place] [address] [phone]    If scheduled at [facility], please arrive at the [facility] main entrance of [facility] 30 minutes prior to test start time. Proceed to the [facility] Radiology Department (  first floor) to check-in and test prep.    Please follow these instructions carefully (unless otherwise directed):  Please do lab work BMP one week prior to the test .  On the Night Before the Test: . Be sure to Drink plenty of water. . Do not consume any caffeinated/decaffeinated beverages or chocolate 12 hours prior to your test. . Do not take any antihistamines 12 hours prior to your test.   On the Day of the Test: . Drink plenty of water. Do not drink any water within one hour of the test. . Do not eat any food 4 hours prior to the test. . You may take your regular medications  prior to the test.  . Take metoprolol (Lopressor)  100 mg  ( 2 tablets) two hours prior to test. . HOLD Hydrochlorothiazide morning of the test. . FEMALES- please wear underwire-free bra if available         After the Test: . Drink plenty of water. . After receiving IV contrast, you may experience a mild flushed feeling. This is normal. . On occasion, you may experience a mild rash up to 24 hours after the test. This is not dangerous. If this occurs, you can take Benadryl 25 mg and increase your fluid intake. . If you experience trouble breathing, this can be serious. If it is severe call 911 IMMEDIATELY. If it is mild, please call our office. . If you take any of these medications: Glipizide/Metformin, please do not take 48 hours after completing test unless otherwise instructed.   Once we have confirmed authorization from your insurance company, we will call you to set up a date and time for your test. Based on how quickly your insurance processes prior authorizations requests, please allow up to 4 weeks to be contacted for scheduling your Cardiac CT appointment. Be advised that routine Cardiac CT appointments could be scheduled as many as 8 weeks after your provider has ordered it.  For non-scheduling related questions, please contact the cardiac imaging nurse navigator should you have any questions/concerns: Marchia Bond, Cardiac Imaging Nurse Navigator Burley Saver, Interim Cardiac Imaging Nurse [place] [facility] Direct Office Dial: [phone]   For scheduling needs, including cancellations and rescheduling, please call Vivien Rota at [phone], option 3.

## 2020-06-17 NOTE — Telephone Encounter (Signed)
Chelsea Benson is calling requesting someone reach out to [employer] to discuss the upcoming procedures that were advised she needs to have performed at her appt today. She states her insurance does not believe her and needs proof from our office. Please advise.

## 2020-06-17 NOTE — Progress Notes (Signed)
Primary Care Provider: Marda Stalker, PA-C, Marda Stalker, PA Cardiologist: Glenetta Hew, MD Electrophysiologist: None  Clinic Note: Chief Complaint  Patient presents with  . Chest Pain    With shortness of breath, occurred at rest  . Tachycardia    Woke up this morning feeling her heart racing   HPI:    Chelsea Benson is a 69 y.o. female with a PMH below who presents today for what amounts to be 2-year follow-up for hypertension.Chelsea Benson was last seen on March 30, 2020 for hypertension follow-up.  She is doing fairly well and recuperating from multiple different surgeries including her right knee and back surgery.  Knee surgery was in 2018 and back surgery in September 2020.  Relatively slow recovery, and activity level significantly reduced.  Notes intermittent exertional dyspnea as well as some resting dyspnea usually controlled with nebulizers.  No angina. -->  Plan was to continue aspirin and statin as well as blood pressure control avoiding beta-blockers for bradycardia (on stable dose of diltiazem).  Consider adding ARB if blood pressures increase. ->  If she showed consistent follow-up would consider coronary calcium score.  Recent Hospitalizations:   None  Reviewed  CV studies:    The following studies were reviewed today: (if available, images/films reviewed: From Epic Chart or Care Everywhere) . None:   Interval History:   Chelsea Benson returns here today with several different complaints including chest discomfort and increased heart rate spells.  She has been having intermittent episodes of discomfort in her chest and shortness of breath that come and go lasting few minutes here and there.  The spells have occurred both at rest and with exertion over the past 1 to 2 weeks, but usually occur while watching TV.  She says that usually these spells are improved with using her inhaler.  She often uses her inhaler before going to bed because  sometimes she wakes up short of breath.  She has had some sharp discomfort across dyspnea chest off and on that is not necessarily associated with exertion or dyspnea. She is also having off-and-on episodes of dizziness and lightheadedness but no real syncope or near syncope.  She has not had real orthostatic symptoms.  She had an episode where she felt like she may passed out with fast heart rate and shortness of breath back in May but felt like this may have been associated with anxiety.  This morning she woke up feeling the heart rate going really fast and some tightness in her chest.   She said the heart was going really fast according to what she felt and was regular. When she checked her blood pressure and heart rate always well.  She has not any further chest pain today.   CV Review of Symptoms (Summary) Cardiovascular ROS: positive for - chest pain, dyspnea on exertion, irregular heartbeat, rapid heart rate and She is also had some epigastric discomfort that may be related to indigestion.  May or may not be associate with activity. negative for - edema, orthopnea, paroxysmal nocturnal dyspnea, shortness of breath or Syncope/near syncope or TIA/amaurosis fugax, claudication  The patient does not have symptoms concerning for COVID-19 infection (fever, chills, cough, or new shortness of breath).  The patient is practicing social distancing & Masking.   She has had both of her COVID-19 vaccine injections.-Pfizer (#1 February 23rd, #04 February 2015)  REVIEWED OF SYSTEMS   Review of Systems  Constitutional: Positive for malaise/fatigue and weight  loss (Has not been eating as much of late).  HENT: Negative for nosebleeds.   Respiratory: Positive for cough, shortness of breath and wheezing.        Related to asthma  Cardiovascular: Positive for chest pain and palpitations.  Gastrointestinal: Positive for abdominal pain (Epigastric) and heartburn. Negative for blood in stool and melena.    Genitourinary: Negative for hematuria.  Musculoskeletal: Positive for back pain and joint pain (Left lower leg and right knee).  Neurological: Positive for dizziness. Negative for focal weakness and weakness.       With some unsteady gait.  Psychiatric/Behavioral: Negative for memory loss. The patient is not nervous/anxious.    I have reviewed and (if needed) personally updated the patient's problem list, medications, allergies, past medical and surgical history, social and family history.   PAST MEDICAL HISTORY   Past Medical History:  Diagnosis Date  . Allergic rhinoconjunctivitis   . Allergy history unknown   . Anxiety   . Arthritis    DDD, spondylosis  . Asthma   . Bipolar disorder ([place])   . Chronic back pain    Has had several surgeries.  He uses a walker  . Chronic kidney disease    renal calculi  . Depression   . Diabetes mellitus without complication ([place])    dx in 2007  . Diabetes mellitus, type II ([place])   . Eczema   . Headache(784.0)    sinus related   . History of kidney stones   . Hx of blood clots    lungs  . Hx of pulmonary embolus 2017  . Hyperlipidemia   . Hypertension   . Insomnia   . Knee pain   . Left ankle pain   . Memory change   . Mental disorder   . Mood disorder ([place])   . OSA on CPAP    PSG 05/24/2016: AHI 17/hour 02 mean 76%.  HSAT 06/13/17, AHI 11-hour 02 mean 79%  . Recurrent upper respiratory infection (URI)   . Sleep apnea   . Tremor of both hands     PAST SURGICAL HISTORY   Past Surgical History:  Procedure Laterality Date  . ABDOMINAL HYSTERECTOMY    . BACK SURGERY     2012- lumbar fusion  . BREAST SURGERY     L cyst removed - 1972  . BUNIONECTOMY Left    Great toe  . calf -R- cyst removed    . CARPAL TUNNEL RELEASE     both hands   . HAMMER TOE SURGERY Left    L foot  . NASAL SINUS SURGERY    . NM MYOVIEW LTD  12/2008   Normal study.  Normal LV function.  EF 61%.  Apical thinning but no ischemia or infarction.  Marland Kitchen  SHOULDER ARTHROSCOPY Left    R shoulder- RCR  . TOE SURGERY    . TOTAL KNEE ARTHROPLASTY Right 05/22/2017   Procedure: TOTAL KNEE ARTHROPLASTY;  Surgeon: Frederik Pear, MD;  Location: [facility];  Service: Orthopedics;  Laterality: Right;  . TRANSTHORACIC ECHOCARDIOGRAM  06/2009    Technically difficult.  Moderate concentric LVH with normal LV function.  No regional wall motion normalities.  EF> 55%.  Normal right ventricle.  No valve lesions.  Normal filling pressures.  Marland Kitchen TRIGGER FINGER RELEASE     L thumb  . TUBAL LIGATION      MEDICATIONS/ALLERGIES   Current Meds  Medication Sig  . ACCU-CHEK GUIDE test strip TEST BID PRN  . albuterol (  PROVENTIL) (2.5 MG/3ML) 0.083% nebulizer solution USE 1 VIAL VIA NEBULIZER EVERY 4 HRS AS NEEDED FOR WHEEZING OR SHORTNESS OF BREATH. MANUFACTURER RECOMMENDS NOT EXCEEDING 4 VIALS PER DAY  . albuterol (VENTOLIN HFA) 108 (90 Base) MCG/ACT inhaler Inhale 2 puffs into the lungs every 4 (four) hours as needed.  . ARIPiprazole (ABILIFY) 2 MG tablet Take 1 tablet (2 mg total) by mouth daily.  Marland Kitchen aspirin EC 81 MG tablet Take 81 mg by mouth daily.  Marland Kitchen azelastine (ASTELIN) 0.1 % nasal spray Place 1 spray into both nostrils 2 (two) times daily.  Marland Kitchen azelastine (OPTIVAR) 0.05 % ophthalmic solution INSTILL 1 DROP INTO BOTH  EYES TWICE DAILY  . Blood Glucose Monitoring Suppl (ACCU-CHEK GUIDE) w/Device KIT See admin instructions.  . budesonide-formoterol (SYMBICORT) 160-4.5 MCG/ACT inhaler Inhale 2 puffs into the lungs in the morning and at bedtime.  . busPIRone (BUSPAR) 15 MG tablet TAKE 1 AND 1/2 TABLETS BY  MOUTH TWICE DAILY  . CLOBETASOL PROPIONATE E 0.05 % emollient cream Apply 1 application topically 2 (two) times daily as needed (rash).   Marland Kitchen desonide (DESOWEN) 0.05 % ointment APPLY TO AFFECTED AREA(S)  TOPICALLY TWICE DAILY AS  NEEDED  . diltiazem (CARDIZEM CD) 180 MG 24 hr capsule Take 180 mg by mouth daily.   Marland Kitchen EPINEPHRINE 0.3 mg/0.3 mL IJ SOAJ injection INJECT CONTENTS  OF 1 PEN AS NEEDED FOR ALLERGIC  RESPONSE AS DIRECTED BY MD. SEEK MEDICAL ATTENTION  AFTER USE.  . famotidine (PEPCID) 20 MG tablet TAKE 1 TABLET BY MOUTH  DAILY AFTER SUPPER.  . fluticasone (FLONASE) 50 MCG/ACT nasal spray USE 2 SPRAYS IN BOTH  NOSTRILS DAILY  . hydrochlorothiazide (HYDRODIURIL) 25 MG tablet Take 25 mg by mouth daily.   . hydrOXYzine (ATARAX/VISTARIL) 25 MG tablet TAKE 1 TABLET BY MOUTH  EVERY 6 HOURS AS NEEDED  . levocetirizine (XYZAL) 5 MG tablet Take 1 tablet (5 mg total) by mouth in the morning and at bedtime.  . Mepolizumab (NUCALA) 100 MG/ML SOAJ Inject 100 mg into the skin every 28 (twenty-eight) days.  . metFORMIN (GLUCOPHAGE-XR) 500 MG 24 hr tablet Take 500 mg by mouth 1 day or 1 dose.   . mirtazapine (REMERON) 30 MG tablet TAKE 1 TABLET BY MOUTH AT  BEDTIME  . montelukast (SINGULAIR) 10 MG tablet TAKE 1 TABLET BY MOUTH AT  BEDTIME  . Oxcarbazepine (TRILEPTAL) 300 MG tablet Take 2 tablets (600 mg total) by mouth at bedtime.  Marland Kitchen oxybutynin (DITROPAN-XL) 10 MG 24 hr tablet Take 10 mg by mouth daily.   . pantoprazole (PROTONIX) 40 MG tablet TAKE 1 TABLET BY MOUTH  DAILY. TAKE 30 TO 60  MINUTES BEFORE FIRST MEAL  OF THE DAY  . potassium chloride SA (K-DUR) 20 MEQ tablet Take 20 mEq by mouth daily.  . pravastatin (PRAVACHOL) 20 MG tablet Take 20 mg by mouth at bedtime.   . pregabalin (LYRICA) 100 MG capsule Take 100 mg by mouth 3 (three) times daily.  Marland Kitchen PROAIR HFA 108 (90 Base) MCG/ACT inhaler Inhale 2 puffs into the lungs every 4 (four) hours as needed for wheezing or shortness of breath.  Marland Kitchen Respiratory Therapy Supplies (NEBULIZER) DEVI Use as directed with nebulizer solution.  Marland Kitchen Respiratory Therapy Supplies (NEBULIZER/TUBING/MOUTHPIECE) KIT Use as directed with nebulizer machine  . Semaglutide, 1 MG/DOSE, (OZEMPIC, 1 MG/DOSE,) 2 MG/1.5ML SOPN Take 1 mg by mouth once a week.  . zolpidem (AMBIEN) 10 MG tablet TAKE 1/2 TO 1 TABLET BY  MOUTH AT BEDTIME AS  NEEDED  FOR SLEEP     Allergies  Allergen Reactions  . Cymbalta [Duloxetine Hcl] Other (See Comments)    Caused hair loss  . Depo-Medrol [Methylprednisolone Acetate] Hives    05/29/20 developed hives "all over my back" after MBB; resolved with Benadryl and "a few days."  Can tolerate Dexamethasone/Decadron.  . Rosuvastatin Calcium Other (See Comments) and Cough    Rapid heart rate  . Almond Meal Itching and Swelling    Tongue swells and itches  . Almond Oil Itching and Swelling    Itching and swelling of tongue   . Carvedilol Cough  . Crestor [Rosuvastatin] Cough  . Fish Allergy Itching    Halibut fish  . Losartan Potassium Cough  . Morphine And Related Itching    SOCIAL HISTORY/FAMILY HISTORY   Reviewed in Epic:  Pertinent findings: No new changes  OBJCTIVE -PE, EKG, labs   Wt Readings from Last 3 Encounters:  06/17/20 175 lb 12.8 oz (79.7 kg)  03/30/20 182 lb 3.2 oz (82.6 kg)  08/21/19 185 lb 6 oz (84.1 kg)    Physical Exam: BP 118/64   Pulse 88   Ht _0  (1.626 m)   Wt 175 lb 12.8 oz (79.7 kg)   SpO2 96%   BMI 30.18 kg/m  Physical Exam Vitals reviewed.  Constitutional:      General: She is not in acute distress.    Appearance: Normal appearance. She is well-developed. She is obese.     Comments: Borderline chronically ill-appearing.  Nontoxic.  Well-groomed.  HENT:     Head: Normocephalic and atraumatic.  Neck:     Vascular: No JVD.  Cardiovascular:     Rate and Rhythm: Regular rhythm. Bradycardia present.  No extrasystoles are present.    Chest Wall: PMI is not displaced.     Pulses: Intact distal pulses. Decreased pulses (Mildly diminished pedal pulses are palpable.).     Heart sounds: S1 normal and S2 normal. Heart sounds are distant. Murmur heard. High-pitched harsh crescendo-decrescendo early systolic murmur is present with a grade of 1/6 at the upper right sternal border.  No friction rub. No gallop.   Pulmonary:     Effort: Pulmonary effort is normal. No  respiratory distress.     Breath sounds: Wheezing (Late expiratory wheeze ) present. No rhonchi or rales.  Chest:     Chest wall: Tenderness present.  Abdominal:     General: Bowel sounds are normal. There is no distension.     Palpations: Abdomen is soft.     Tenderness: There is abdominal tenderness (Epigastric).     Comments: No HSM  Musculoskeletal:        General: No swelling. Normal range of motion.     Cervical back: Normal range of motion and neck supple.  Neurological:     General: No focal deficit present.     Mental Status: She is alert and oriented to person, place, and time.  Psychiatric:        Mood and Affect: Mood normal.        Behavior: Behavior normal.        Thought Content: Thought content normal.        Judgment: Judgment normal.     Adult ECG Report  Rate: 83 ;  Rhythm: normal sinus rhythm, premature atrial contractions (PAC) and Right atrial abnormality.  Poor R wave progression.  Cannot rule out anterior MI.;   Narrative Interpretation: Stable EKG.  Recent Labs: November 14, 2018-TC 154, TG  82, HDL 51, LDL 87.  (March 2021A1c 7.3)  No results found for: CHOL, HDL, LDLCALC, LDLDIRECT, TRIG, CHOLHDL Lab Results  Component Value Date   CREATININE 0.81 08/14/2019   BUN 17 08/14/2019   NA 141 08/14/2019   K 3.0 (L) 08/14/2019   CL 103 08/14/2019   CO2 28 08/14/2019   No results found for: TSH  ASSESSMENT/PLAN    Problem List Items Addressed This Visit    Hyperlipidemia with target LDL less than 100 (Chronic)    No recurrent labs available since December 2019.  At that time LDL was at goal.  She continues to be on pravastatin.  Labs should be checked by PCP soon.  Defer treatment pending results..      Essential hypertension (Chronic)    Relatively well-controlled blood pressure today on moderate dose of diltiazem (appropriate given her asthma) Is also on HCTZ. She has had issues with ACE inhibitor's and ARB as well as carvedilol in the past. As  long as her blood pressure is controlled, no changes for now.      Moderate persistent asthma with acute exacerbation    She deftly has some wheezing and dyspnea on exam.  Is on Symbicort and albuterol.  Would not be a great option to consider Lexiscan Myoview.  This could be the cause of her exertional dyspnea, however, need to exclude ischemic etiology.      Chest pain at rest - Primary    Chest pain at rest but also with exertion.  Some features sound quite atypical, but others are are typical and the fact that it is exacerbated with exertion and associated dyspnea.  Cardiac risk factors include hypertension, hyperlipidemia, obesity with passive cigarette smoke.  Plan: I do not think she be a to walk very far on a treadmill because of knee pains, otherwise would order a GXT. With her ongoing asthma and wheezing, would be reluctant to do [facility], therefore I think the best course of action will be ischemic value with coronary CTA which will allow evaluation of both anatomy and physiology.      Relevant Orders   CT CORONARY MORPH W/CTA COR W/SCORE W/CA W/CM &/OR WO/CM   CT CORONARY FRACTIONAL FLOW RESERVE DATA PREP   CT CORONARY FRACTIONAL FLOW RESERVE FLUID ANALYSIS   Basic metabolic panel   Rapid palpitations    I discussed the spell with her, it seems like this is the only spell that she has had like this.  Were they to recur, would consider an event monitor, however for now I think the best course of action will be an outpatient personal monitor with smart phone applications such as Kardia by [employer].  Would be reluctant to use beta-blocker, but could consider increasing diltiazem dose.      Relevant Orders   CT CORONARY MORPH W/CTA COR W/SCORE W/CA W/CM &/OR WO/CM   CT CORONARY FRACTIONAL FLOW RESERVE DATA PREP   CT CORONARY FRACTIONAL FLOW RESERVE FLUID ANALYSIS   Basic metabolic panel       [id number] Education: The signs and symptoms of COVID-19 were discussed  with the patient and how to seek care for testing (follow up with PCP or arrange E-visit).   The importance of social distancing and COVID-19 vaccination was discussed today.  I spent a total of 46mnutes with the patient. >  50% of the time was spent in direct patient consultation.  Additional time spent with chart review  / charting (studies, outside notes, etc): 8  Total Time: 26 min   Current medicines are reviewed at length with the patient today.  (+/- concerns) none  Notice: This dictation was prepared with Dragon dictation along with smaller phrase technology. Any transcriptional errors that result from this process are unintentional and may not be corrected upon review.  Patient Instructions / Medication Changes & Studies & Tests Ordered   Patient Instructions  Medication Instructions:  See instructions below   no there changes  *If you need a refill on our cardiac medications before your next appointment, please call your pharmacy*   Lab Work: See instructions  Below for BMP  If you have labs (blood work) drawn today and your tests are completely normal, you will receive your results only by: Marland Kitchen MyChart Message (if you have MyChart) OR . A paper copy in the mail If you have any lab test that is abnormal or we need to change your treatment, we will call you to review the results.   Testing/Procedures: Will be schedule [facility] radiology dept [phone] - once authorization is obtained  Your physician has requested that you have cardiac CTa. Cardiac computed tomography (CT) is a painless test that uses an x-ray machine to take clear, detailed pictures of your heart.  Please follow instruction sheet as given.     Follow-Up: At [facility], you and your health needs are our priority.  As part of our continuing mission to provide you with exceptional heart care, we have created designated Provider Care Teams.  These Care Teams include your primary Cardiologist  (physician) and Advanced Practice Providers (APPs -  Physician Assistants and Nurse Practitioners) who all work together to provide you with the care you need, when you need it.  We recommend signing up for the patient portal called "MyChart".  Sign up information is provided on this After Visit Summary.  MyChart is used to connect with patients for Virtual Visits (Telemedicine).  Patients are able to view lab/test results, encounter notes, upcoming appointments, etc.  Non-urgent messages can be sent to your provider as well.   To learn more about what you can do with MyChart, go to [email or web address].    Your next appointment:   2 month(s)  The format for your next appointment:   In Person  Provider:   Glenetta Hew, MD   Other Instructions Recommendations for vagal maneuvers:for when you have the fast heartrates  "Bearing down"  Coughing  Gagging  Icy, cold towel on face or drink ice cold water   DR Deepak Bless RECOMMENDS THAT YOU  PURCHASES  " Kardia" By [employer]. FROM THE  [employer]  APP PLAY STORE.   THE APP IS FREE , BUT THE  EQUIPMENT HAS A COST. IT IS A WAY FOR YOU TO OBTAIN A RECORDING OF YOUR HEART RATE . IT WILL BE A SHORT RHYTHM  STRIP THAT YOU CAN SHOW TO MEDICAL STAFF.  ALSO , YOU MAY [place] - EKG- MONITOR YOU ARE ABLE TO KEEP RECORDINGS ON YOUR SMARTPHONE , INSTEAD OF PURCHASING A SEPARATE EQUIPMENT   Dr Ellyn Hack  recommends portable  ECG monitor  to capture heart rate reading. There is a monitor that can be purchased on [employer]. The  monitor name is "EMAY"  at last checked it cost $79.  Our understanding there no more cost of that purchasing the machine. You will need a computer to be able to upload your recording and to be printed. It also come with a  USB to charge the monitor.   Your cardiac CT will be scheduled at  the below location:   [facility] [address] [place], [place] [address] [phone]    If scheduled at [facility], please arrive at the [facility] main entrance of [facility] 30 minutes prior to test start time. Proceed to the [facility] Radiology Department (first floor) to check-in and test prep.    Please follow these instructions carefully (unless otherwise directed):  Please do lab work BMP one week prior to the test .  On the Night Before the Test: . Be sure to Drink plenty of water. . Do not consume any caffeinated/decaffeinated beverages or chocolate 12 hours prior to your test. . Do not take any antihistamines 12 hours prior to your test.   On the Day of the Test: . Drink plenty of water. Do not drink any water within one hour of the test. . Do not eat any food 4 hours prior to the test. . You may take your regular medications prior to the test.  . Take metoprolol (Lopressor)  100 mg  ( 2 tablets) two hours prior to test. . HOLD Hydrochlorothiazide morning of the test. . FEMALES- please wear underwire-free bra if available         After the Test: . Drink plenty of water. . After receiving IV contrast, you may experience a mild flushed feeling. This is normal. . On occasion, you may experience a mild rash up to 24 hours after the test. This is not dangerous. If this occurs, you can take Benadryl 25 mg and increase your fluid intake. . If you experience trouble breathing, this can be serious. If it is severe call 911 IMMEDIATELY. If it is mild, please call our office. . If you take any of these medications: Glipizide/Metformin, please do not take 48 hours after completing test unless otherwise instructed.   Once we have confirmed authorization from your insurance company, we will call you to set up a date and time for your test. Based on how quickly your insurance processes prior authorizations requests, please allow up to 4 weeks to be contacted for scheduling your Cardiac CT appointment. Be advised that routine Cardiac CT  appointments could be scheduled as many as 8 weeks after your provider has ordered it.  For non-scheduling related questions, please contact the cardiac imaging nurse navigator should you have any questions/concerns: Marchia Bond, Cardiac Imaging Nurse Navigator Burley Saver, Interim Cardiac Imaging Nurse [place] [facility] Direct Office Dial: [phone]   For scheduling needs, including cancellations and rescheduling, please call Vivien Rota at [phone], option 3.       Studies Ordered:   Orders Placed This Encounter  Procedures  . CT CORONARY MORPH W/CTA COR W/SCORE W/CA W/CM &/OR WO/CM  . CT CORONARY FRACTIONAL FLOW RESERVE DATA PREP  . CT CORONARY FRACTIONAL FLOW RESERVE FLUID ANALYSIS  . Basic metabolic panel     Glenetta Hew, M.D., M.S. Interventional Cardiologist   Pager # [phone] Phone # [phone] [address] [place], [place] [address]   Thank you for choosing Heartcare at [facility]!!

## 2020-06-20 ENCOUNTER — Encounter: Payer: Self-pay | Admitting: Cardiology

## 2020-06-20 NOTE — Assessment & Plan Note (Signed)
I discussed the spell with her, it seems like this is the only spell that she has had like this.  Were they to recur, would consider an event monitor, however for now I think the best course of action will be an outpatient personal monitor with smart phone applications such as Kardia by [employer].  Would be reluctant to use beta-blocker, but could consider increasing diltiazem dose.

## 2020-06-20 NOTE — Assessment & Plan Note (Signed)
Chest pain at rest but also with exertion.  Some features sound quite atypical, but others are are typical and the fact that it is exacerbated with exertion and associated dyspnea.  Cardiac risk factors include hypertension, hyperlipidemia, obesity with passive cigarette smoke.  Plan: I do not think she be a to walk very far on a treadmill because of knee pains, otherwise would order a GXT. With her ongoing asthma and wheezing, would be reluctant to do [facility], therefore I think the best course of action will be ischemic value with coronary CTA which will allow evaluation of both anatomy and physiology.

## 2020-06-20 NOTE — Assessment & Plan Note (Signed)
No recurrent labs available since December 2019.  At that time LDL was at goal.  She continues to be on pravastatin.  Labs should be checked by PCP soon.  Defer treatment pending results.Marland Kitchen

## 2020-06-20 NOTE — Assessment & Plan Note (Signed)
She deftly has some wheezing and dyspnea on exam.  Is on Symbicort and albuterol.  Would not be a great option to consider Lexiscan Myoview.  This could be the cause of her exertional dyspnea, however, need to exclude ischemic etiology.

## 2020-06-20 NOTE — Assessment & Plan Note (Signed)
Relatively well-controlled blood pressure today on moderate dose of diltiazem (appropriate given her asthma) Is also on HCTZ. She has had issues with ACE inhibitor's and ARB as well as carvedilol in the past. As long as her blood pressure is controlled, no changes for now.

## 2020-06-24 ENCOUNTER — Telehealth: Payer: Self-pay | Admitting: Allergy

## 2020-06-24 MED ORDER — AZITHROMYCIN 250 MG PO TABS
ORAL_TABLET | ORAL | 0 refills | Status: DC
Start: 2020-06-24 — End: 2020-08-24

## 2020-06-24 MED ORDER — PREDNISONE 20 MG PO TABS: 20.0000 mg | ORAL_TABLET | Freq: Two times a day (BID) | ORAL | 0 refills | Status: AC

## 2020-06-24 NOTE — Telephone Encounter (Signed)
Patient called and said that she was having headaches and her right ear was hurting too. She thinks she has a sinus infection and chest congestion and coughing, no fever, this is been ing going for 3 days. Can not come in because on transportation [facility] [address] [phone]

## 2020-06-24 NOTE — Telephone Encounter (Signed)
Prescriptions sent and patient informed.

## 2020-06-24 NOTE — Telephone Encounter (Signed)
She does have history of recurrent sinus infections and asthma.  Not too long ago was treated with Augmentin.    Thus with new symptoms let's have her do azithromycin 500mg  day 1 and 250mg  day 2-5 and prednisone 20mg  twice a day x 5 days.

## 2020-06-24 NOTE — Telephone Encounter (Signed)
I called patient back and she states her headache started 3 days ago. She states tylenol is not helping.The right ear pain started 2 days ago. She is also having runny nose/congestion and some cough. She is coughing up yellow phlegm. Please advice.

## 2020-06-25 ENCOUNTER — Telehealth: Payer: Self-pay | Admitting: Cardiology

## 2020-06-25 NOTE — Telephone Encounter (Signed)
That sounds great.  It probably was something external because her pressures have been doing very well otherwise.  Bryan Lemma, MD

## 2020-06-25 NOTE — Telephone Encounter (Signed)
Pt c/o BP issue: STAT if pt c/o blurred vision, one-sided weakness or slurred speech  1. What are your last 5 BP readings?  06/25/20: 157/117 taken at Dr. Isidore Moos  Pt has other readings at home just not with her   2. Are you having any other symptoms (ex. Dizziness, headache, blurred vision, passed out)? headache  3. What is your BP issue? Pt noticed her BP has been high, especially the bottom numbers. She called while at her PCP because her PCP wanted her Cardiologist to know about her high BP. She will call back to document her BP readings when she is home from her appointment

## 2020-06-25 NOTE — Telephone Encounter (Signed)
Patient states she took her allergy medications, azithromycin (ZITHROMAX) 250 MG tablet and predniSONE (DELTASONE) 20 MG tablet, and her BP went down to 132/79. She states she thinks her headaches were sinus headaches.

## 2020-06-25 NOTE — Telephone Encounter (Signed)
[employer] wants to know if Dr. Herbie Baltimore recommended that this patient get an at home EKG. She is wanting to discuss further because the process would be different if Dr. Herbie Baltimore ordered her to get one or if she just wants one. She does not have a number that you can call back, she states she is going to call back around 3pm.

## 2020-06-25 NOTE — Telephone Encounter (Signed)
Spoke to pt who report she was seen at her PCP office today and BP was 157/117. She report PCP requested that she contact Dr. Herbie Baltimore to make him aware her BP has been elevated for the 3 days, averaging around 157/110. Pt report occasional headaches but denies any other symptoms.   Will route to MD for recommendations.

## 2020-06-25 NOTE — Telephone Encounter (Signed)
Called and spoke with pt, she was just calling to let Dr.Harding know that her BP was going down. She reports that she took her allergy medications and her BP went down to 132/79. She also thinks her headaches were sinus headaches. See message below. Notified I would let Dr.Harding know and if he had any other recommendations we would let her know. Pt verbalized understanding with no other questions at this time.

## 2020-06-29 NOTE — Telephone Encounter (Signed)
Spoke to patient she stated her insurance plan does not cover Kardia mobile.Stated her navigator Andrey Campanile is working on a different device that is covered under her plan.I spoke to [place] with [facility] at # [phone].He stated a fitbit watch is covered under her plan.He advised to call [phone].Patient stated Andrey Campanile will be back tomorrow.She is going to wait on Sandy.

## 2020-06-30 ENCOUNTER — Ambulatory Visit (INDEPENDENT_AMBULATORY_CARE_PROVIDER_SITE_OTHER): Payer: 59 | Admitting: Psychiatry

## 2020-06-30 ENCOUNTER — Other Ambulatory Visit: Payer: Self-pay

## 2020-06-30 DIAGNOSIS — F419 Anxiety disorder, unspecified: Secondary | ICD-10-CM | POA: Diagnosis not present

## 2020-06-30 NOTE — Progress Notes (Signed)
      [facility] Counselor/Therapist Progress Note  Patient ID: Chelsea Benson, MRN: [id number],    Date: 06/30/2020  Time Spent: 45 minutes   10:00am to 10:45am  Treatment Type: Individual Therapy  Reported Symptoms: anxiety some of which is about medical issues, "some depression",   Mental Status Exam:  Appearance:   Casual     Behavior:  Appropriate, Sharing and Motivated  Motor:  Normal  Speech/Language:   Normal Rate  Affect:  anxious  Mood:  anxious and some depression  Thought process:  goal directed  Thought content:    WNL  Sensory/Perceptual disturbances:    WNL  Orientation:  oriented to person, place, time/date, situation, day of week, month of year and year  Attention:  Good  Concentration:  Good  Memory:  forgetfulness-is seeing a neurologist in Aug. 30th  Fund of knowledge:   Good  Insight:    Good and Fair  Judgment:   Good and Fair  Impulse Control:  Good   Risk Assessment: Danger to Self:  No Self-injurious Behavior: No Danger to Others: No Duty to Warn:no Physical Aggression / Violence:No  Access to Firearms a concern: No  Gang Involvement:No   Subjective: Patient today reporting anxiety, some depression.   Interventions: Solution-Oriented/Positive Psychology and Ego-Supportive  Diagnosis:   ICD-10-CM   1. Anxiety disorder, unspecified type  F41.9     Plan: Patient not signing tx goals on computer screen due to COVID.  Treatment Goals:  Goals remain on tx plan as patient works on strategies to meet her goals. Progress will be documented each visit in "Progress" section on Plan.   Long term goal:(measurable) Reduce overall level, frequency, and intensity of the anxiety so that daily functioning is not impaired.* Patient will eventually report a rating of "4"or less" on 1-10 anxiety scale for a45-month time period where her daily functioning is not impaired.  Short term goal: Increase understanding of beliefs and messages  that produce the worry and anxiety.  Strategy: Patient will explore and work to stop cognitive messages that feed anxiety and work to replace them with more positive and empowering messages.   PROGRESS: Patient in today reporting anxiety about family, "the world", and health issues. Talks through some of her anxious thoughts today and also tried to work with her on better anxiety management.  Patient feels she is managing anxiety as well as she can in watching certain programs on TV, reading magazines, crossword puzzles, cooking, contact with family friends. Encouraged her to get out of the house some more as she is safely able to do so. Did discuss healthy boundaries with her again as she states family members continue to lean on her, which is a lot for patient to handle. Talked about options she has for support within the family and she states she can talk with her 3 aunts and her oldest daughter.  On 1-10 anxiety scale, today she rates herself an "8". Supported patient in continuing good self-care including reaching out to family members that can offer support to her, setting healthy boundaries as needed, trying to decrease her worryiing, deep breathing exercises, exercise as she is able (walking, hand weights, and stress balls), getting outside some daily, and positive self-talk.  Goal review and progress noted.  Next appt within 3 weeks.   Mathis Fare, LCSW

## 2020-07-01 ENCOUNTER — Telehealth (INDEPENDENT_AMBULATORY_CARE_PROVIDER_SITE_OTHER): Payer: 59 | Admitting: Psychiatry

## 2020-07-01 ENCOUNTER — Encounter: Payer: Self-pay | Admitting: Psychiatry

## 2020-07-01 DIAGNOSIS — F5101 Primary insomnia: Secondary | ICD-10-CM | POA: Diagnosis not present

## 2020-07-01 DIAGNOSIS — F419 Anxiety disorder, unspecified: Secondary | ICD-10-CM | POA: Diagnosis not present

## 2020-07-01 DIAGNOSIS — F3181 Bipolar II disorder: Secondary | ICD-10-CM | POA: Diagnosis not present

## 2020-07-01 MED ORDER — BELSOMRA 15 MG PO TABS: 15.0000 mg | ORAL_TABLET | Freq: Every day | ORAL | 0 refills | Status: DC

## 2020-07-01 MED ORDER — BUSPIRONE HCL 30 MG PO TABS: 30.0000 mg | ORAL_TABLET | Freq: Two times a day (BID) | ORAL | 0 refills | Status: DC

## 2020-07-01 MED ORDER — ARIPIPRAZOLE 2 MG PO TABS: 2.0000 mg | ORAL_TABLET | Freq: Every day | ORAL | 1 refills | Status: DC

## 2020-07-01 MED ORDER — BELSOMRA 20 MG PO TABS: 20.0000 mg | ORAL_TABLET | Freq: Every day | ORAL | 0 refills | Status: DC

## 2020-07-01 MED ORDER — OXCARBAZEPINE 300 MG PO TABS: 600.0000 mg | ORAL_TABLET | Freq: Every day | ORAL | 1 refills | Status: DC

## 2020-07-01 NOTE — Progress Notes (Signed)
Chelsea Benson [id number] Apr 08, 1951 69 y.o.  Virtual Visit via Telephone Note  I connected with pt on 07/01/20 at  9:00 AM EDT by telephone and verified that I am speaking with the correct person using two identifiers.   I discussed the limitations, risks, security and privacy concerns of performing an evaluation and management service by telephone and the availability of in person appointments. I also discussed with the patient that there may be a patient responsible charge related to this service. The patient expressed understanding and agreed to proceed.   I discussed the assessment and treatment plan with the patient. The patient was provided an opportunity to ask questions and all were answered. The patient agreed with the plan and demonstrated an understanding of the instructions.   The patient was advised to call back or seek an in-person evaluation if the symptoms worsen or if the condition fails to improve as anticipated.  I provided 30 minutes of non-face-to-face time during this encounter.  The patient was located at home.  The provider was located at [place].   Thayer Headings, PMHNP   Subjective:   Patient ID:  Chelsea Benson is a 69 y.o. (DOB 1951-06-06) female.  Chief Complaint:  Chief Complaint  Patient presents with  . Anxiety  . Insomnia    HPI Chelsea Benson presents for follow-up of anxiety, insomnia, and h/o mood disturbance. She reports that 2 months ago she had to have a breast biopsy that was benign. She then had to have a procedure on her back and reports that this was difficult for her. She reports that a family member is having health issues. She has been having cardiac issues. Youngest daughter had COVID in March and her family then became ill. She reports that she has been trying to support others and feels that she is not able to receive support herself. "I feel like I have no control...it's like it's not going to stop." Reports that  she is having intrusive memories about other past traumatic experiences where she experienced loss of control and felt responsible for others.   She reports that her anxiety has been elevated. Having panic attacks daily. She reports feeling like the house is closing in on her. She reports getting no more than 5 hours of sleep and some sleepless nights since stressors began 2 months ago. She reports distress relating to socially isolating with the pandemic. Appetite has been decreased and forces herself to eat to maintain stable glucose levels. She reports that her motivation and energy have been good. Denies depressed mood. Difficulty with concentration and focus. She reports that she has been more forgetful and has been leaving water running and forgetting things on the stove. Denies SI.   Past Psychiatric Medication Trials: Cymbalta-hair loss Mirtazapine Prozac Trazodone-ineffective Ambien BuSpar Hydroxyzine Abilify Trileptal Xanax  Review of Systems:  Review of Systems  Cardiovascular: Positive for palpitations.  Musculoskeletal: Positive for gait problem.  Psychiatric/Behavioral:       Please refer to HPI    Medications: I have reviewed the patient's current medications.  Current Outpatient Medications  Medication Sig Dispense Refill  . busPIRone (BUSPAR) 30 MG tablet Take 1 tablet (30 mg total) by mouth 2 (two) times daily. 180 tablet 0  . mirtazapine (REMERON) 30 MG tablet TAKE 1 TABLET BY MOUTH AT  BEDTIME 90 tablet 3  . Oxcarbazepine (TRILEPTAL) 300 MG tablet Take 2 tablets (600 mg total) by mouth at bedtime. 180 tablet 1  . zolpidem (  AMBIEN) 10 MG tablet TAKE 1/2 TO 1 TABLET BY  MOUTH AT BEDTIME AS NEEDED  FOR SLEEP 90 tablet 0  . ACCU-CHEK GUIDE test strip TEST BID PRN  1  . albuterol (PROVENTIL) (2.5 MG/3ML) 0.083% nebulizer solution USE 1 VIAL VIA NEBULIZER EVERY 4 HRS AS NEEDED FOR WHEEZING OR SHORTNESS OF BREATH. MANUFACTURER RECOMMENDS NOT EXCEEDING 4 VIALS PER DAY 270  mL 1  . albuterol (VENTOLIN HFA) 108 (90 Base) MCG/ACT inhaler Inhale 2 puffs into the lungs every 4 (four) hours as needed. 54 g 1  . ARIPiprazole (ABILIFY) 2 MG tablet Take 1 tablet (2 mg total) by mouth daily. 90 tablet 1  . aspirin EC 81 MG tablet Take 81 mg by mouth daily.    Marland Kitchen azelastine (ASTELIN) 0.1 % nasal spray Place 1 spray into both nostrils 2 (two) times daily. 90 mL 1  . azelastine (OPTIVAR) 0.05 % ophthalmic solution INSTILL 1 DROP INTO BOTH  EYES TWICE DAILY 12 mL 5  . azithromycin (ZITHROMAX) 250 MG tablet Take 2 tablets by mouth once on day one. On days 2-5 take 1 tablet by mouth once daily. 6 each 0  . Blood Glucose Monitoring Suppl (ACCU-CHEK GUIDE) w/Device KIT See admin instructions.  1  . budesonide-formoterol (SYMBICORT) 160-4.5 MCG/ACT inhaler Inhale 2 puffs into the lungs in the morning and at bedtime. 30.6 g 1  . CLOBETASOL PROPIONATE E 0.05 % emollient cream Apply 1 application topically 2 (two) times daily as needed (rash).     Marland Kitchen desonide (DESOWEN) 0.05 % ointment APPLY TO AFFECTED AREA(S)  TOPICALLY TWICE DAILY AS  NEEDED 180 g 1  . diltiazem (CARDIZEM CD) 180 MG 24 hr capsule Take 180 mg by mouth daily.     Marland Kitchen EPINEPHRINE 0.3 mg/0.3 mL IJ SOAJ injection INJECT CONTENTS OF 1 PEN AS NEEDED FOR ALLERGIC  RESPONSE AS DIRECTED BY MD. SEEK MEDICAL ATTENTION  AFTER USE. 4 each 1  . famotidine (PEPCID) 20 MG tablet TAKE 1 TABLET BY MOUTH  DAILY AFTER SUPPER. 90 tablet 3  . fluticasone (FLONASE) 50 MCG/ACT nasal spray USE 2 SPRAYS IN BOTH  NOSTRILS DAILY 48 g 1  . hydrochlorothiazide (HYDRODIURIL) 25 MG tablet Take 25 mg by mouth daily.     . hydrOXYzine (ATARAX/VISTARIL) 25 MG tablet TAKE 1 TABLET BY MOUTH  EVERY 6 HOURS AS NEEDED 180 tablet 0  . levocetirizine (XYZAL) 5 MG tablet Take 1 tablet (5 mg total) by mouth in the morning and at bedtime. 180 tablet 1  . Mepolizumab (NUCALA) 100 MG/ML SOAJ Inject 100 mg into the skin every 28 (twenty-eight) days. 0.84 mL 11  .  metFORMIN (GLUCOPHAGE-XR) 500 MG 24 hr tablet Take 500 mg by mouth 1 day or 1 dose.     . metoprolol tartrate (LOPRESSOR) 50 MG tablet Take 2 tablets (100 mg total) by mouth once for 1 dose. TAKE TWO HOURS PRIOR TO  SCHEDULE CARDIAC TEST 2 tablet 0  . montelukast (SINGULAIR) 10 MG tablet TAKE 1 TABLET BY MOUTH AT  BEDTIME 90 tablet 3  . pantoprazole (PROTONIX) 40 MG tablet TAKE 1 TABLET BY MOUTH  DAILY. TAKE 30 TO 60  MINUTES BEFORE FIRST MEAL  OF THE DAY 90 tablet 3  . potassium chloride SA (K-DUR) 20 MEQ tablet Take 20 mEq by mouth daily.    . pravastatin (PRAVACHOL) 20 MG tablet Take 20 mg by mouth at bedtime.     . pregabalin (LYRICA) 100 MG capsule Take 100 mg by mouth  3 (three) times daily.    Marland Kitchen PROAIR HFA 108 (90 Base) MCG/ACT inhaler Inhale 2 puffs into the lungs every 4 (four) hours as needed for wheezing or shortness of breath. 56 g 1  . Respiratory Therapy Supplies (NEBULIZER) DEVI Use as directed with nebulizer solution. 1 each 1  . Respiratory Therapy Supplies (NEBULIZER/TUBING/MOUTHPIECE) KIT Use as directed with nebulizer machine 1 kit 12  . Semaglutide, 1 MG/DOSE, (OZEMPIC, 1 MG/DOSE,) 2 MG/1.5ML SOPN Take 1 mg by mouth once a week.     No current facility-administered medications for this visit.    Medication Side Effects: None  Allergies:  Allergies  Allergen Reactions  . Cymbalta [Duloxetine Hcl] Other (See Comments)    Caused hair loss  . Depo-Medrol [Methylprednisolone Acetate] Hives    05/29/20 developed hives "all over my back" after MBB; resolved with Benadryl and "a few days."  Can tolerate Dexamethasone/Decadron.  . Rosuvastatin Calcium Other (See Comments) and Cough    Rapid heart rate  . Almond Meal Itching and Swelling    Tongue swells and itches  . Almond Oil Itching and Swelling    Itching and swelling of tongue   . Carvedilol Cough  . Crestor [Rosuvastatin] Cough  . Fish Allergy Itching    Halibut fish  . Losartan Potassium Cough  . Morphine And  Related Itching    Past Medical History:  Diagnosis Date  . Allergic rhinoconjunctivitis   . Allergy history unknown   . Anxiety   . Arthritis    DDD, spondylosis  . Asthma   . Bipolar disorder ([place])   . Chronic back pain    Has had several surgeries.  He uses a walker  . Chronic kidney disease    renal calculi  . Depression   . Diabetes mellitus without complication ([place])    dx in 2007  . Diabetes mellitus, type II ([place])   . Eczema   . Headache(784.0)    sinus related   . History of kidney stones   . Hx of blood clots    lungs  . Hx of pulmonary embolus 2017  . Hyperlipidemia   . Hypertension   . Insomnia   . Knee pain   . Left ankle pain   . Memory change   . Mental disorder   . Mood disorder ([place])   . OSA on CPAP    PSG 05/24/2016: AHI 17/hour 02 mean 76%.  HSAT 06/13/17, AHI 11-hour 02 mean 79%  . Recurrent upper respiratory infection (URI)   . Sleep apnea   . Tremor of both hands     Family History  Problem Relation Age of Onset  . Diabetes Sister   . Diabetes Brother   . Hypertension Brother   . Cancer Maternal Grandmother   . Heart failure Maternal Grandmother   . Hypertension Paternal Grandmother   . Asthma Daughter   . Cancer Daughter   . Depression Daughter   . Hypertension Daughter   . Heart failure Maternal Aunt   . Pneumonia Mother   . Diabetes Mother   . Alcoholism Father   . Alcohol abuse Father   . Depression Daughter   . Schizophrenia Grandchild   . Allergies Neg Hx   . Eczema Neg Hx   . Immunodeficiency Neg Hx     Social History   Socioeconomic History  . Marital status: Single    Spouse name: Not on file  . Number of children: 2  . Years of education: Not on  file  . Highest education level: Some college, no degree  Occupational History    Comment: NA  Tobacco Use  . Smoking status: Passive Smoke Exposure - Never Smoker  . Smokeless tobacco: Never Used  . Tobacco comment: grandson smokes, but in his car  Vaping Use  .  Vaping Use: Never used  Substance and Sexual Activity  . Alcohol use: Yes    Comment: rare use  . Drug use: No  . Sexual activity: Not Currently  Other Topics Concern  . Not on file  Social History Narrative   She is currently single.  Has 2 girls.  Has some college education.  Currently disabled due to chronic back pain and not employed.   07/01/19 lives with brother Shanon Brow   Caffeine, none   Social Determinants of Health   Financial Resource Strain:   . Difficulty of Paying Living Expenses:   Food Insecurity:   . Worried About Charity fundraiser in the Last Year:   . Arboriculturist in the Last Year:   Transportation Needs:   . Film/video editor (Medical):   Marland Kitchen Lack of Transportation (Non-Medical):   Physical Activity:   . Days of Exercise per Week:   . Minutes of Exercise per Session:   Stress:   . Feeling of Stress :   Social Connections:   . Frequency of Communication with Friends and Family:   . Frequency of Social Gatherings with Friends and Family:   . Attends Religious Services:   . Active Member of Clubs or Organizations:   . Attends Archivist Meetings:   Marland Kitchen Marital Status:   Intimate Partner Violence:   . Fear of Current or Ex-Partner:   . Emotionally Abused:   Marland Kitchen Physically Abused:   . Sexually Abused:     Past Medical History, Surgical history, Social history, and Family history were reviewed and updated as appropriate.   Please see review of systems for further details on the patient's review from today.   Objective:   Physical Exam:  There were no vitals taken for this visit.  Physical Exam Neurological:     Mental Status: She is alert and oriented to person, place, and time.     Cranial Nerves: No dysarthria.  Psychiatric:        Attention and Perception: Attention and perception normal.        Mood and Affect: Mood is anxious and depressed.        Speech: Speech normal.        Behavior: Behavior is cooperative.        Thought  Content: Thought content normal. Thought content is not paranoid or delusional. Thought content does not include homicidal or suicidal ideation. Thought content does not include homicidal or suicidal plan.        Cognition and Memory: Cognition and memory normal.        Judgment: Judgment normal.     Comments: Insight intact     Lab Review:     Component Value Date/Time   NA 141 08/14/2019 0859   NA 134 (A) 05/23/2017 0000   K 3.0 (L) 08/14/2019 0859   CL 103 08/14/2019 0859   CO2 28 08/14/2019 0859   GLUCOSE 120 (H) 08/14/2019 0859   BUN 17 08/14/2019 0859   BUN 14 05/23/2017 0000   CREATININE 0.81 08/14/2019 0859   CALCIUM 9.8 08/14/2019 0859   PROT 7.3 04/28/2019 1712   ALBUMIN 4.2 04/28/2019 1712  AST 21 04/28/2019 1712   ALT 30 04/28/2019 1712   ALKPHOS 68 04/28/2019 1712   BILITOT 0.5 04/28/2019 1712   GFRNONAA >60 08/14/2019 0859   GFRAA >60 08/14/2019 0859       Component Value Date/Time   WBC 4.8 08/14/2019 0859   RBC 4.86 08/14/2019 0859   HGB 12.8 08/14/2019 0859   HGB 12.5 11/08/2018 1041   HCT 41.9 08/14/2019 0859   HCT 39.5 11/08/2018 1041   PLT 289 08/14/2019 0859   MCV 86.2 08/14/2019 0859   MCV 81 11/08/2018 1041   MCH 26.3 08/14/2019 0859   MCHC 30.5 08/14/2019 0859   RDW 14.9 08/14/2019 0859   RDW 14.3 11/08/2018 1041   LYMPHSABS 2.1 04/28/2019 1712   LYMPHSABS 1.4 11/08/2018 1041   MONOABS 0.5 04/28/2019 1712   EOSABS 0.3 04/28/2019 1712   EOSABS 0.2 11/08/2018 1041   BASOSABS 0.0 04/28/2019 1712   BASOSABS 0.0 11/08/2018 1041    No results found for: POCLITH, LITHIUM   No results found for: PHENYTOIN, PHENOBARB, VALPROATE, CBMZ   .res Assessment: Plan:   Patient seen for 30 minutes and time spent discussing treatment options and reviewing past responses to medications.  Discussed that BuSpar has seemed to be helpful for her anxiety and well-tolerated.  Discussed increasing BuSpar from 22.5 mg twice daily to 30 mg twice daily to improve  anxiety signs and symptoms. Discussed potential benefits, risks, and side effects of other treatment options for insomnia, to include Belsomra.  Discussed that patient may be eligible for free trial of Belsomra.  Will fax prescription and free trial voucher to patient's pharmacy.  Will start Belsomra 15 mg at bedtime for 10 nights, then increase to 20 mg at bedtime if 15 mg dose is not fully effective and has been well-tolerated.  Advised patient to contact office to request prescription if Belsomra is helpful for her sleep so that a prescription can be sent to her pharmacy and insurance approval obtained.  Advised patient to contact office if Belsomra is not effective for her insomnia. Recommended initially taking half of an Ambien 10 mg tablet to avoid rebound insomnia and then considering discontinuation of Ambien if Belsomra is effective for her insomnia.  Will continue all other medications without changes at this time. Patient follow-up in 4 weeks or sooner if clinically indicated. Recommend continuing psychotherapy with Rinaldo Cloud, LCSW. Patient advised to contact office with any questions, adverse effects, or acute worsening in signs and symptoms.  Breanne was seen today for anxiety and insomnia.  Diagnoses and all orders for this visit:  Anxiety disorder, unspecified type -     busPIRone (BUSPAR) 30 MG tablet; Take 1 tablet (30 mg total) by mouth 2 (two) times daily. -     Oxcarbazepine (TRILEPTAL) 300 MG tablet; Take 2 tablets (600 mg total) by mouth at bedtime.  Bipolar II disorder (HCC) -     ARIPiprazole (ABILIFY) 2 MG tablet; Take 1 tablet (2 mg total) by mouth daily.    Please see After Visit Summary for patient specific instructions.  Future Appointments  Date Time Provider [place]  07/14/2020  8:00 AM Shanon Ace, LCSW CP-CP None  07/16/2020  9:15 AM [facility] 1 [facility]  07/28/2020  8:00 AM Shanon Ace, LCSW CP-CP None  08/04/2020  9:00 AM Penumalli, Earlean Polka, MD  [facility] None  08/11/2020  9:00 AM Shanon Ace, LCSW CP-CP None  08/24/2020  9:20 AM Leonie Man, MD [facility]  08/25/2020  9:00 AM Shanon Ace, LCSW CP-CP None  09/08/2020  9:00 AM Shanon Ace, LCSW CP-CP None    No orders of the defined types were placed in this encounter.     -------------------------------

## 2020-07-02 ENCOUNTER — Telehealth: Payer: Self-pay | Admitting: Cardiology

## 2020-07-02 ENCOUNTER — Telehealth: Payer: Self-pay | Admitting: Psychiatry

## 2020-07-02 NOTE — Telephone Encounter (Signed)
LMTCB

## 2020-07-02 NOTE — Telephone Encounter (Signed)
Chelsea Benson is calling stating she has been unable to purchase the HR monitor and EKG machine Dr. Herbie Baltimore advised her she needs to get at her last appt. [name] states she tried to reach out to her insurance and they advised her they will not cover it. Please advise.

## 2020-07-02 NOTE — Telephone Encounter (Signed)
Prescriptions for Belsomra 15mg  and 20mg  was faxed to [facility] local pharmacy - [facility] on [address] with Belsomra coupon card per JC's instructions.

## 2020-07-02 NOTE — Telephone Encounter (Signed)
Noted  

## 2020-07-03 ENCOUNTER — Telehealth: Payer: Self-pay | Admitting: Cardiology

## 2020-07-03 NOTE — Telephone Encounter (Signed)
Pt is calling to return phone call from dr. Herbie [place] nurse. Says that she returned phone call yesterday and never received a call back. Please call

## 2020-07-03 NOTE — Telephone Encounter (Signed)
Spoke to pt who report her insurance won't pay for Kardia mobile because documentation state it's recommended versus pt need it. Pt voiced she is on a fixed income and can't afford it on her own.

## 2020-07-03 NOTE — Telephone Encounter (Signed)
Spoke with pt, she can afford the kardia monitor or other things dr harding recommended.

## 2020-07-05 NOTE — Telephone Encounter (Signed)
We are trying to figure out a way for her to check her heart rate & rhythm when she feels these palpitations.  Chelsea Benson probably would not be covered by insurance.  At a minimum, it will be nice to know what her heart rate is with these spells.   If she has another spell like the one that brought her to this appointment - she should check her pulse & count how many beats she has in 30 sec (multiply x 2) for BPM.    If it is > 100 bpm, she should call in to let us know.  We can then order a 2 week or 30 day monitor to officially capture a spell.  Bryan Lemma, MD

## 2020-07-06 LAB — BASIC METABOLIC PANEL
BUN/Creatinine Ratio: 21 (ref 12–28)
BUN: 13 mg/dL (ref 8–27)
CO2: 26 mmol/L (ref 20–29)
Calcium: 10.2 mg/dL (ref 8.7–10.3)
Chloride: 100 mmol/L (ref 96–106)
Creatinine, Ser: 0.62 mg/dL (ref 0.57–1.00)
GFR calc Af Amer: 108 mL/min/{1.73_m2} (ref >59)
GFR calc non Af Amer: 94 mL/min/{1.73_m2} (ref >59)
Glucose: 277 mg/dL — ABNORMAL HIGH (ref 65–99)
Potassium: 4.2 mmol/L (ref 3.5–5.2)
Sodium: 139 mmol/L (ref 134–144)

## 2020-07-08 ENCOUNTER — Telehealth: Payer: Self-pay | Admitting: Psychiatry

## 2020-07-08 IMAGING — RF DG C-ARM 1-60 MIN
1 series · 2 of 2 positions shown · non-contrast
Comparison: MRI 06/10/2019, CT 03/29/2017

CLINICAL DATA: L3-4 PLIF with hardware removal

EXAM:
LUMBAR SPINE - 2-3 VIEW; DG C-ARM 1-60 MIN

[Series 1: run · 2 of 2 slices shown]
[im 1/2]
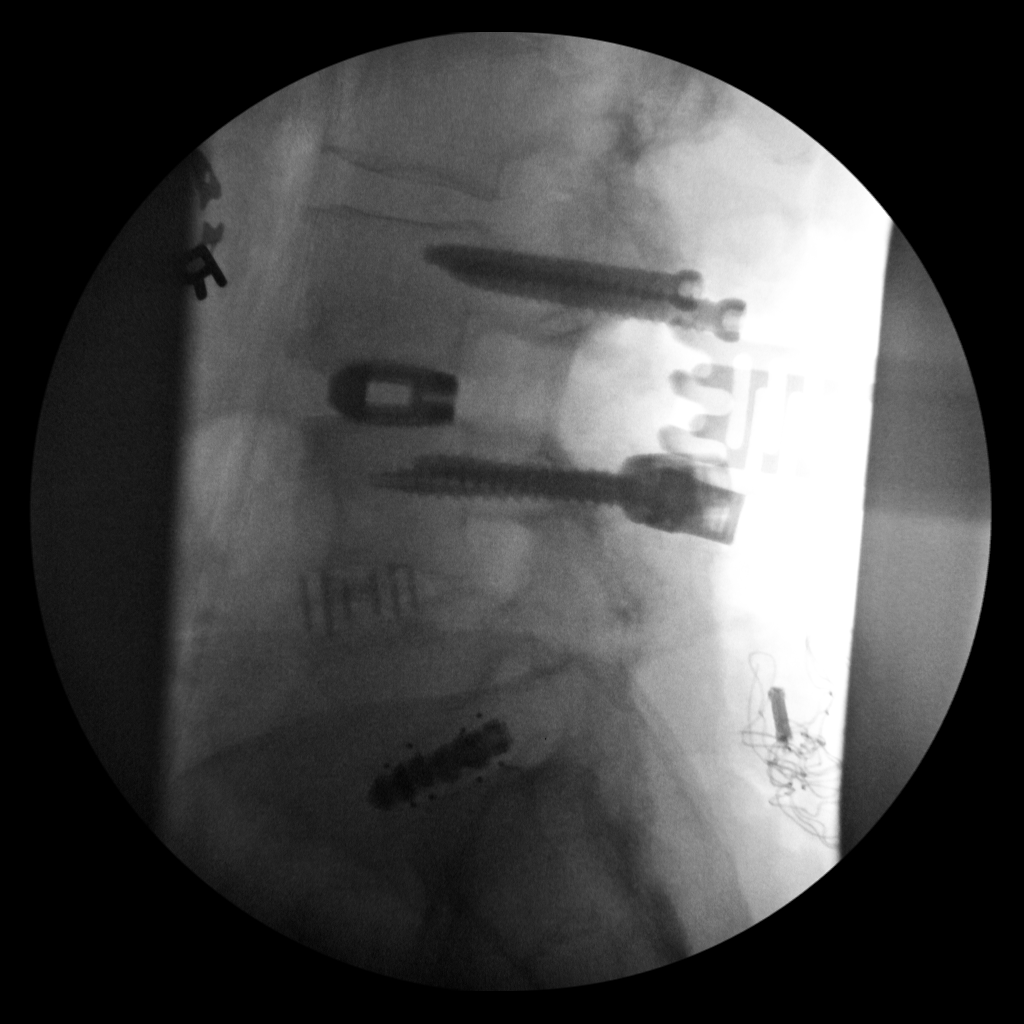
[im 2/2]
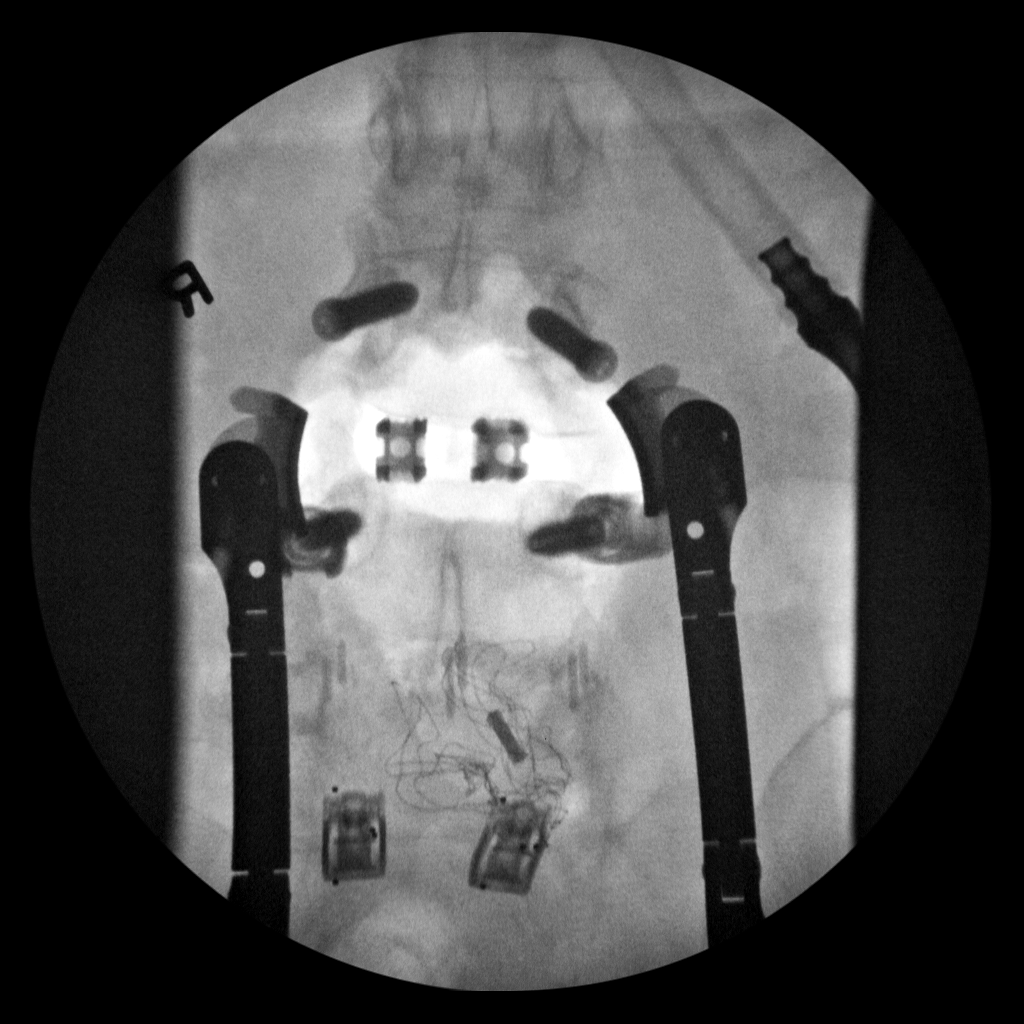

[2 of 2 positions shown; findings below may reference images not displayed]

FINDINGS: Two low resolution intraoperative spot views of the lumbar spine.
Total fluoroscopy time was 38 seconds. Previously noted posterior
rods and screws at L5 and S1 are removed. Interbody devices remain
at L4-L5 and L5-S1. Fixating screws present at L3-L4 with interbody
device. Sponge material on AP view at the lower lumbar spine.
IMPRESSION: Intraoperative fluoroscopic assistance provided during lumbar spine
surgery

## 2020-07-08 IMAGING — RF DG LUMBAR SPINE 2-3V
1 series · 2 of 2 positions shown · non-contrast
Comparison: MRI 06/10/2019, CT 03/29/2017

CLINICAL DATA: L3-4 PLIF with hardware removal

EXAM:
LUMBAR SPINE - 2-3 VIEW; DG C-ARM 1-60 MIN

[Series 1: run · 2 of 2 slices shown]
[im 1/2]
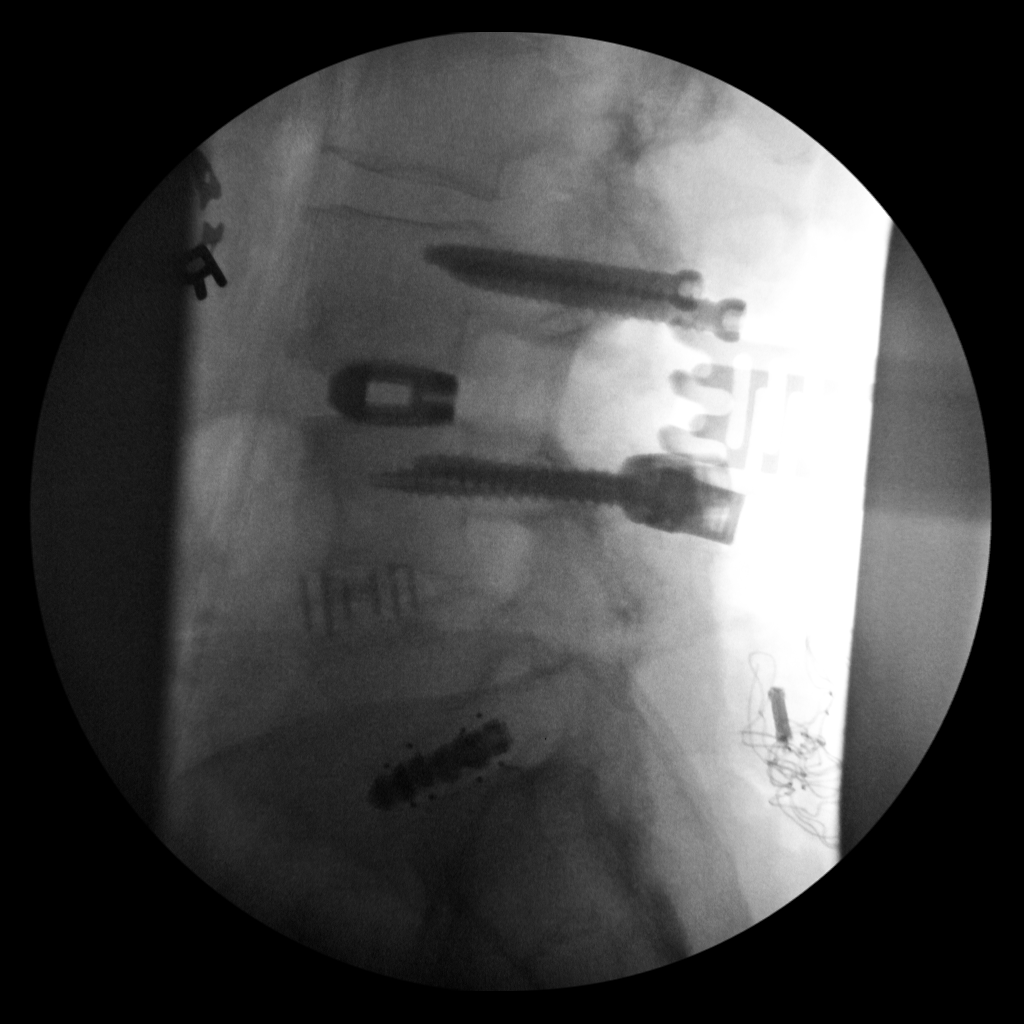
[im 2/2]
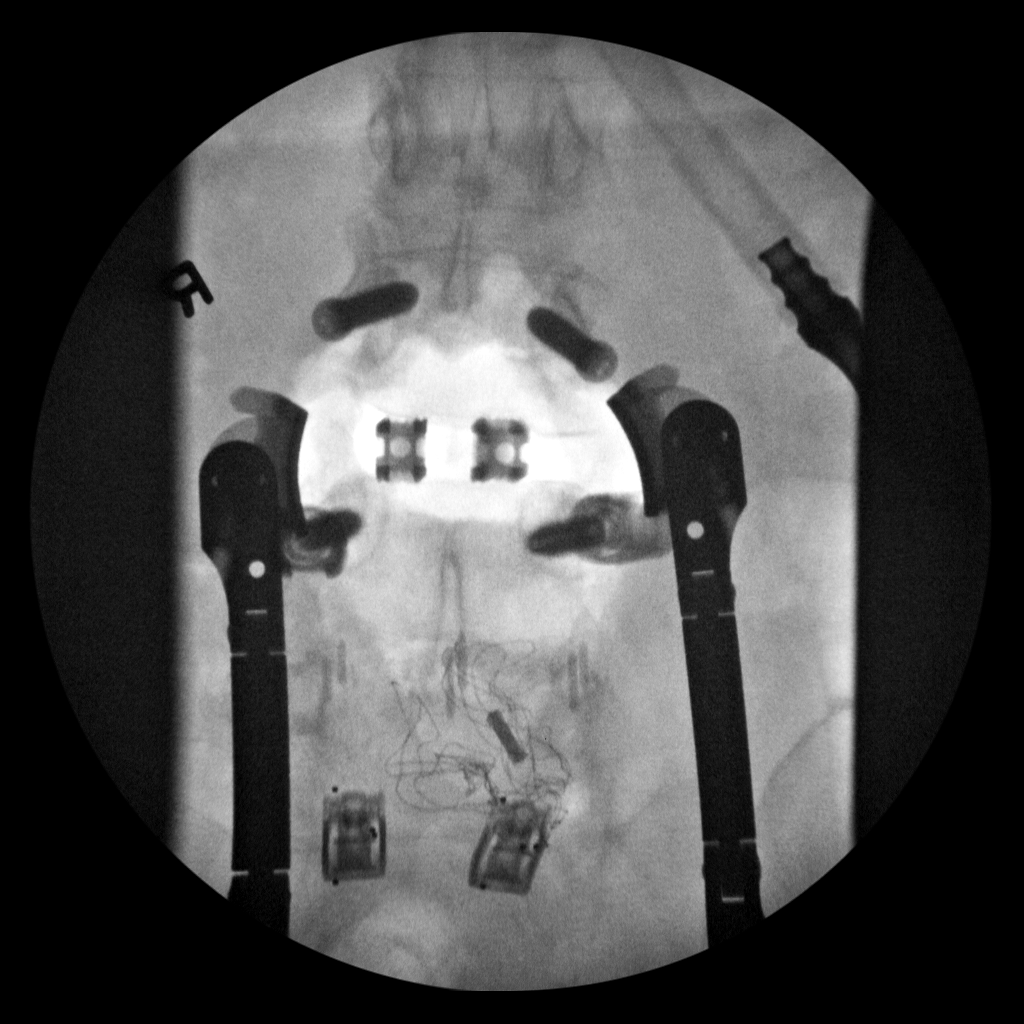

[2 of 2 positions shown; findings below may reference images not displayed]

FINDINGS: Two low resolution intraoperative spot views of the lumbar spine.
Total fluoroscopy time was 38 seconds. Previously noted posterior
rods and screws at L5 and S1 are removed. Interbody devices remain
at L4-L5 and L5-S1. Fixating screws present at L3-L4 with interbody
device. Sponge material on AP view at the lower lumbar spine.
IMPRESSION: Intraoperative fluoroscopic assistance provided during lumbar spine
surgery

## 2020-07-08 NOTE — Telephone Encounter (Signed)
Left patient detailed message that initially Chelsea Benson recommend she take 1/2 tablet of Ambien 10 mg to avoid rebound insomnia, and consider discontinuing the Ambien if the Belsomra is effective for her.  Instructed to call back for further questions or concerns.

## 2020-07-08 NOTE — Telephone Encounter (Signed)
Chelsea Benson would like to know if she needs to take her Ambien with Belsomra. [phone].

## 2020-07-10 ENCOUNTER — Telehealth: Payer: Self-pay | Admitting: Allergy

## 2020-07-10 ENCOUNTER — Other Ambulatory Visit: Payer: Self-pay | Admitting: *Deleted

## 2020-07-10 MED ORDER — PREDNISONE 10 MG PO TABS
ORAL_TABLET | ORAL | 0 refills | Status: DC
Start: 2020-07-10 — End: 2020-08-24

## 2020-07-10 NOTE — Telephone Encounter (Signed)
Is she having any impact with her asthma? With symptoms being 2 days it is likely not bacterial in nature to require antibiotics.  Would however treat sinusitis with a prednisone burst - recommend a Friday pack sent into pharmacy.

## 2020-07-10 NOTE — Telephone Encounter (Signed)
Called and spoke with patient and she stated that she is not having asthma issues. Advised of prednisone, patient verbalized understanding. Prednisone has been sent to requested pharmacy.

## 2020-07-10 NOTE — Telephone Encounter (Signed)
Possible sinus problems, watery eyes, redness in the eyes, hoarse throat, running. Symptoms started 2 days ago taking her vitals  it every hour and stating her vitals are with in normal limits. She is currently taking Tylenol sinus medication, Azelastine 2 sprays twice daily, Flonase 2 sprays twice daily, Xyzal twice daily and Singulair at night. Not taking any mucus relievers nor on any albuterol. Patient states she is not able to come into the office since she has to use transportation and have to notify them 3 days in advance. Patient is willing to do a tele-visit if applicable or if she could get some medication sent in to help with her symptoms.

## 2020-07-10 NOTE — Telephone Encounter (Signed)
Pt advised of MD recommendations. Pt voiced her daughter brought her some type of heart monitor and will use it to check HR and rhythm when she has a spell.

## 2020-07-10 NOTE — Telephone Encounter (Signed)
Patient called and thinks she has a sinus infection and nose is running clear. She took her fever and it was 97.0 no coughing right now. [facility] [address] [phone]

## 2020-07-13 ENCOUNTER — Telehealth (HOSPITAL_COMMUNITY): Payer: Self-pay | Admitting: *Deleted

## 2020-07-13 NOTE — Telephone Encounter (Signed)
Pt reaching out regarding upcoming cardiac imaging study; pt verbalizes understanding of appt date/time, parking situation and where to check in, pre-test NPO status and medications ordered, and verified current allergies; name and call back number provided for further questions should they arise ° °Traniece Boffa Tai RN Navigator Cardiac Imaging °[facility] °[phone] office °[phone] cell ° °

## 2020-07-14 ENCOUNTER — Ambulatory Visit (INDEPENDENT_AMBULATORY_CARE_PROVIDER_SITE_OTHER): Payer: 59 | Admitting: Psychiatry

## 2020-07-14 ENCOUNTER — Telehealth: Payer: Self-pay | Admitting: Psychiatry

## 2020-07-14 ENCOUNTER — Ambulatory Visit: Payer: 59 | Admitting: Psychiatry

## 2020-07-14 ENCOUNTER — Telehealth (INDEPENDENT_AMBULATORY_CARE_PROVIDER_SITE_OTHER): Payer: 59 | Admitting: Psychiatry

## 2020-07-14 ENCOUNTER — Encounter: Payer: Self-pay | Admitting: Psychiatry

## 2020-07-14 VITALS — BP 127/77 | HR 86 | Temp 97.1°F

## 2020-07-14 DIAGNOSIS — F3181 Bipolar II disorder: Secondary | ICD-10-CM

## 2020-07-14 DIAGNOSIS — F419 Anxiety disorder, unspecified: Secondary | ICD-10-CM | POA: Diagnosis not present

## 2020-07-14 DIAGNOSIS — F5101 Primary insomnia: Secondary | ICD-10-CM

## 2020-07-14 MED ORDER — ARIPIPRAZOLE 2 MG PO TABS: 4.0000 mg | ORAL_TABLET | Freq: Every day | ORAL | 1 refills | Status: DC

## 2020-07-14 MED ORDER — BELSOMRA 20 MG PO TABS: 20.0000 mg | ORAL_TABLET | Freq: Every day | ORAL | 0 refills | Status: DC

## 2020-07-14 NOTE — Telephone Encounter (Signed)
Chelsea Benson called asking for a telephone appt today. I told her we would need to check with Rochele Raring first. Issue is she has not been sleeping well due to extra stress. Her daughter is sick and not sure if she has covid. This is causing a lot of anxiety and even taking the Belsomra and Ambien, she is not sleeping well.

## 2020-07-14 NOTE — Progress Notes (Signed)
[facility] Counselor/Therapist Progress Note  Patient ID: Chelsea Benson, MRN: [id number],    Date: 07/14/2020  Time Spent: 60 minutes   7:50am to 8:50am  Virtual Visit  Note via MyChart Connected with patient by a video enabled telemedicine/telehealth application or telephone, with their informed consent, and verified patient privacy and that I am speaking with the correct person using two identifiers. I discussed the limitations, risks, security and privacy concerns of performing psychotherapy and management service by telephone and the availability of in person appointments. I also discussed with the patient that there may be a patient responsible charge related to this service. The patient expressed understanding and agreed to proceed. I discussed the treatment planning with the patient. The patient was provided an opportunity to ask questions and all were answered. The patient agreed with the plan and demonstrated an understanding of the instructions. The patient was advised to call  our office if  symptoms worsen or feel they are in a crisis state and need immediate contact.   Therapist Location: [facility] Patient Location: home   Treatment Type: Individual Therapy        Reported Symptoms: anxiety, fearful thoughts  Mental Status Exam:  Appearance:   casual     Behavior:  Appropriate, Sharing and Motivated  Motor:  Normal  Speech/Language:   Clear and Coherent  Affect:  anxious  Mood:  anxious  Thought process:  normal  Thought content:    some obsessivenes and worrying cycles  Sensory/Perceptual disturbances:    WNL  Orientation:  oriented to person, place, time/date, situation, day of week, month of year and year  Attention:  Good  Concentration:  Good and Fair  Memory:  some forgetfulness worse under stress  Fund of knowledge:   Good  Insight:    Good and Fair  Judgment:   Good  Impulse Control:  Good   Risk Assessment: Danger to Self:   No Self-injurious Behavior: No Danger to Others: No Duty to Warn:no Physical Aggression / Violence:No  Access to Firearms a concern: No  Gang Involvement:No   Subjective:  Patient today reports anxiety heightened and some sleep difficulty, especially with the current surge of Covid.   Interventions: Solution-Oriented/Positive Psychology and Ego-Supportive  Diagnosis:   ICD-10-CM   1. Anxiety disorder, unspecified type  F41.9     Plan: Patient not signing tx goals on computer screen due to COVID.  Treatment Goals:  Goals remain on tx plan as patient works on strategies to meet her goals. Progress will be documented each visit in "Progress" section on Plan.   Long term goal:(measurable) Reduce overall level, frequency, and intensity of the anxiety so that daily functioning is not impaired.* Patient will eventually report a rating of "4"or less" on 1-10 anxiety scale for a47-month time period where her daily functioning is not impaired.  Short term goal: Increase understanding of beliefs and messages that produce the worry and anxiety.  Strategy: Patient will explore and work to stop cognitive messages that feed anxiety and work to replace them with more positive and empowering messages.   PROGRESS: Patient today reporting anxiety worsened mostly "with all I'm already dealing with and Covid getting bad again."  Her own health concerns, her family, and "the world" she says also contribute to her anxiety. States she has been unable to really put into practice anxiety management strategies "because I can't stop my anxious thoughts, I've always been this way, I need more medicine to help  me sleep and not be anxious."  Is planning to contact her med provider today and request earlier phone appt than waiting til Aug. 25th. Has followed up some on being outside more and states "it is calming but hard to do when I'm anxious." Did report having fun with family on her birthday and  other times during that week, and did not feel anxious at those times, "good distraction". In reviewing some thoughts from last session, patient stated that she was" feeling some better by session end and the following activities do help her some: cooking, being in contact with family and friends, reading, TV shows, using her hand weights, being outside, deep breathing exercises, crossword puzzles, and positive self-talk."  On a 1-10 anxiety scale, she self-reports that she is a "7", which is a 1 point decrease since last session. Encouraged to avoid the worry-cycles, and to stay involved with some of the activities above as they have proven to be helpful.  Goal review and progress/challenges noted with patient.  Next appt within 2 weeks.   Mathis Fare, LCSW
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
## 2020-07-16 ENCOUNTER — Other Ambulatory Visit: Payer: Self-pay

## 2020-07-16 ENCOUNTER — Ambulatory Visit (HOSPITAL_COMMUNITY)
Admission: RE | Admit: 2020-07-16 | Discharge: 2020-07-16 | Disposition: A | Discharge status: Home / Self Care | Payer: 59 | Source: Ambulatory Visit | Attending: Cardiology | Admitting: Cardiology

## 2020-07-16 DIAGNOSIS — R079 Chest pain, unspecified: Secondary | ICD-10-CM

## 2020-07-16 DIAGNOSIS — R002 Palpitations: Secondary | ICD-10-CM | POA: Insufficient documentation

## 2020-07-16 DIAGNOSIS — I517 Cardiomegaly: Secondary | ICD-10-CM | POA: Insufficient documentation

## 2020-07-16 DIAGNOSIS — K760 Fatty (change of) liver, not elsewhere classified: Secondary | ICD-10-CM | POA: Diagnosis not present

## 2020-07-16 MED ORDER — DILTIAZEM HCL 25 MG/5ML IV SOLN
10.0000 mg | Freq: Once | INTRAVENOUS | Status: AC
  Filled 2020-07-16: qty 5

## 2020-07-16 MED ORDER — DILTIAZEM HCL 25 MG/5ML IV SOLN
5.0000 mg | Freq: Once | INTRAVENOUS | Status: AC
  Administered 2020-07-16: 5 mg via INTRAVENOUS
  Filled 2020-07-16: qty 5

## 2020-07-16 MED ORDER — IOHEXOL 300 MG/ML  SOLN
100.0000 mL | Freq: Once | INTRAMUSCULAR | Status: AC | PRN
  Administered 2020-07-16: 100 mL via INTRAVENOUS

## 2020-07-16 MED ORDER — DILTIAZEM HCL 25 MG/5ML IV SOLN
INTRAVENOUS | Status: AC
  Administered 2020-07-16: 10 mg via INTRAVENOUS
  Filled 2020-07-16: qty 5

## 2020-07-16 MED ORDER — NITROGLYCERIN 0.4 MG SL SUBL
SUBLINGUAL_TABLET | SUBLINGUAL | Status: AC
  Administered 2020-07-16: 0.8 mg via SUBLINGUAL
  Filled 2020-07-16: qty 2

## 2020-07-16 MED ORDER — METOPROLOL TARTRATE 5 MG/5ML IV SOLN
5.0000 mg | INTRAVENOUS | Status: DC | PRN
  Administered 2020-07-16: 5 mg via INTRAVENOUS

## 2020-07-16 MED ORDER — NITROGLYCERIN 0.4 MG SL SUBL: 0.8000 mg | SUBLINGUAL_TABLET | Freq: Once | SUBLINGUAL | Status: AC

## 2020-07-16 MED ORDER — METOPROLOL TARTRATE 5 MG/5ML IV SOLN
INTRAVENOUS | Status: AC
  Filled 2020-07-16: qty 10

## 2020-07-21 ENCOUNTER — Other Ambulatory Visit: Payer: Self-pay | Admitting: Psychiatry

## 2020-07-21 ENCOUNTER — Telehealth: Payer: Self-pay

## 2020-07-21 DIAGNOSIS — F419 Anxiety disorder, unspecified: Secondary | ICD-10-CM

## 2020-07-21 NOTE — Telephone Encounter (Signed)
PA for Levocetirizine was initiated through [email or web address] and denied. Will need to attempt through [email or web address]

## 2020-07-21 NOTE — Telephone Encounter (Signed)
Patient called stating [facility] will no longer fill her Xyzal as it is not covered anymore. Patient is wondering what other allergy medication can she switch to that would be covered?

## 2020-07-21 NOTE — Telephone Encounter (Signed)
Will need to initiate a PA for Medicare and if denied then a PA for Medicaid to get approval on Xyzal.

## 2020-07-22 NOTE — Telephone Encounter (Signed)
PA form has been filled out for [employer] and has been placed in Dr. Ellouise Newer box for him to sign so that it can be faxed.

## 2020-07-24 NOTE — Telephone Encounter (Signed)
This is for next refill

## 2020-07-24 NOTE — Telephone Encounter (Signed)
Has this form been faxed back to insurance for review?

## 2020-07-27 NOTE — Telephone Encounter (Signed)
PA has been faxed to [facility] and is currently pending approval or denial.

## 2020-07-28 ENCOUNTER — Ambulatory Visit (INDEPENDENT_AMBULATORY_CARE_PROVIDER_SITE_OTHER): Payer: 59 | Admitting: Psychiatry

## 2020-07-28 DIAGNOSIS — F419 Anxiety disorder, unspecified: Secondary | ICD-10-CM | POA: Diagnosis not present
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
## 2020-07-29 ENCOUNTER — Telehealth (INDEPENDENT_AMBULATORY_CARE_PROVIDER_SITE_OTHER): Payer: 59 | Admitting: Psychiatry

## 2020-07-29 ENCOUNTER — Other Ambulatory Visit: Payer: Self-pay

## 2020-07-29 ENCOUNTER — Encounter: Payer: Self-pay | Admitting: Psychiatry

## 2020-07-29 DIAGNOSIS — F419 Anxiety disorder, unspecified: Secondary | ICD-10-CM | POA: Diagnosis not present

## 2020-07-29 DIAGNOSIS — F5101 Primary insomnia: Secondary | ICD-10-CM | POA: Diagnosis not present

## 2020-07-29 DIAGNOSIS — F410 Panic disorder [episodic paroxysmal anxiety] without agoraphobia: Secondary | ICD-10-CM | POA: Diagnosis not present

## 2020-07-29 MED ORDER — CLONAZEPAM 0.5 MG PO TABS: ORAL_TABLET | ORAL | 0 refills | Status: DC

## 2020-07-29 MED ORDER — ZOLPIDEM TARTRATE 10 MG PO TABS: 5.0000 mg | ORAL_TABLET | Freq: Every evening | ORAL | 0 refills | Status: DC | PRN

## 2020-07-29 NOTE — Progress Notes (Addendum)
Chelsea Benson [id number] [birth year] 69 y.o.  Virtual Visit via Telephone Note  I connected with pt on 07/29/20 at  9:00 AM EDT by telephone and verified that I am speaking with the correct person using two identifiers.   I discussed the limitations, risks, security and privacy concerns of performing an evaluation and management service by telephone and the availability of in person appointments. I also discussed with the patient that there may be a patient responsible charge related to this service. The patient expressed understanding and agreed to proceed.   I discussed the assessment and treatment plan with the patient. The patient was provided an opportunity to ask questions and all were answered. The patient agreed with the plan and demonstrated an understanding of the instructions.   The patient was advised to call back or seek an in-person evaluation if the symptoms worsen or if the condition fails to improve as anticipated.  I provided 25 minutes of non-face-to-face time during this encounter.  The patient was located at home.  The provider was located at [place].   Thayer Headings, PMHNP   Subjective:   Patient ID:  Chelsea Benson is a 69 y.o. (DOB September 06, 1951) female.  Chief Complaint:  Chief Complaint  Patient presents with  . Anxiety  . Panic Attack  . Insomnia    HPI Chelsea Benson presents for follow-up of anxiety. She reports, "I'm stressed out again... I have a heart problem now." reports that she learned that late last week that she has an enlarged aorta and may need surgery. She reports that her anxiety is elevated and she is having panic attacks. She reports that she is having frequent panic attacks. Reports that she has been screaming into her pillow- "It's just too much... it's one thing after the other, after the other." She reports frequent worry, rumination, catastrophic thinking. Chest pains and stomach discomfort with anxiety. She reports  that her sleep has been disrupted. Difficulty falling and staying asleep. Estimates sleeping about 5 hours a night. Early morning awakening. Appetite has been poor and is trying to eat some. She reports that energy is low. She is pushing herself to get up to care for her granddaughter and take classes. Feeling frustrated. Some depression. Difficulty with concentration. Denies SI- "no, but I just can't take any more."  She reports that increase in Abilify seemed to help with anxiety since recent health issue. She reports that Belsomra was working better than Ambien for insomnia and now Lorrin Mais seems to be more effective.    Past Psychiatric Medication Trials: Cymbalta-hair loss Mirtazapine Prozac Trazodone-ineffective Ambien BuSpar Hydroxyzine Abilify Trileptal Xanax  Review of Systems:  Review of Systems  Cardiovascular: Positive for chest pain.  Gastrointestinal: Positive for abdominal pain.  Musculoskeletal: Negative for gait problem.  Neurological: Positive for headaches.  Psychiatric/Behavioral:       Please refer to HPI    Medications: I have reviewed the patient's current medications.  Current Outpatient Medications  Medication Sig Dispense Refill  . ARIPiprazole (ABILIFY) 2 MG tablet Take 2 tablets (4 mg total) by mouth daily. 90 tablet 1  . Suvorexant (BELSOMRA) 20 MG TABS Take 20 mg by mouth at bedtime. 90 tablet 0  . ACCU-CHEK GUIDE test strip TEST BID PRN  1  . albuterol (PROVENTIL) (2.5 MG/3ML) 0.083% nebulizer solution USE 1 VIAL VIA NEBULIZER EVERY 4 HRS AS NEEDED FOR WHEEZING OR SHORTNESS OF BREATH. MANUFACTURER RECOMMENDS NOT EXCEEDING 4 VIALS PER DAY 270 mL 1  .  albuterol (VENTOLIN HFA) 108 (90 Base) MCG/ACT inhaler Inhale 2 puffs into the lungs every 4 (four) hours as needed. 54 g 1  . aspirin EC 81 MG tablet Take 81 mg by mouth daily.    Marland Kitchen azelastine (ASTELIN) 0.1 % nasal spray Place 1 spray into both nostrils 2 (two) times daily. 90 mL 1  . azelastine  (OPTIVAR) 0.05 % ophthalmic solution INSTILL 1 DROP INTO BOTH  EYES TWICE DAILY 12 mL 5  . azithromycin (ZITHROMAX) 250 MG tablet Take 2 tablets by mouth once on day one. On days 2-5 take 1 tablet by mouth once daily. (Patient not taking: Reported on 07/16/2020) 6 each 0  . Blood Glucose Monitoring Suppl (ACCU-CHEK GUIDE) w/Device KIT See admin instructions.  1  . budesonide-formoterol (SYMBICORT) 160-4.5 MCG/ACT inhaler Inhale 2 puffs into the lungs in the morning and at bedtime. 30.6 g 1  . busPIRone (BUSPAR) 30 MG tablet Take 1 tablet (30 mg total) by mouth 2 (two) times daily. 180 tablet 0  . CLOBETASOL PROPIONATE E 0.05 % emollient cream Apply 1 application topically 2 (two) times daily as needed (rash).     . clonazePAM (KLONOPIN) 0.5 MG tablet Take 1/2-1 tab po TID prn anxiety. (Do not combine with Ambien) 90 tablet 0  . desonide (DESOWEN) 0.05 % ointment APPLY TO AFFECTED AREA(S)  TOPICALLY TWICE DAILY AS  NEEDED 180 g 1  . diltiazem (CARDIZEM CD) 180 MG 24 hr capsule Take 180 mg by mouth daily.     Marland Kitchen EPINEPHRINE 0.3 mg/0.3 mL IJ SOAJ injection INJECT CONTENTS OF 1 PEN AS NEEDED FOR ALLERGIC  RESPONSE AS DIRECTED BY MD. SEEK MEDICAL ATTENTION  AFTER USE. 4 each 1  . famotidine (PEPCID) 20 MG tablet TAKE 1 TABLET BY MOUTH  DAILY AFTER SUPPER. 90 tablet 3  . fluticasone (FLONASE) 50 MCG/ACT nasal spray USE 2 SPRAYS IN BOTH  NOSTRILS DAILY 48 g 1  . hydrochlorothiazide (HYDRODIURIL) 25 MG tablet Take 25 mg by mouth daily.     . hydrOXYzine (ATARAX/VISTARIL) 25 MG tablet TAKE 1 TABLET BY MOUTH  EVERY 6 HOURS AS NEEDED 180 tablet 0  . levocetirizine (XYZAL) 5 MG tablet Take 1 tablet (5 mg total) by mouth in the morning and at bedtime. 180 tablet 1  . Mepolizumab (NUCALA) 100 MG/ML SOAJ Inject 100 mg into the skin every 28 (twenty-eight) days. 0.84 mL 11  . metFORMIN (GLUCOPHAGE-XR) 500 MG 24 hr tablet Take 500 mg by mouth 1 day or 1 dose.     . metoprolol tartrate (LOPRESSOR) 50 MG tablet Take 2  tablets (100 mg total) by mouth once for 1 dose. TAKE TWO HOURS PRIOR TO  SCHEDULE CARDIAC TEST 2 tablet 0  . mirtazapine (REMERON) 30 MG tablet TAKE 1 TABLET BY MOUTH AT  BEDTIME 90 tablet 3  . montelukast (SINGULAIR) 10 MG tablet TAKE 1 TABLET BY MOUTH AT  BEDTIME 90 tablet 3  . Oxcarbazepine (TRILEPTAL) 300 MG tablet Take 2 tablets (600 mg total) by mouth at bedtime. 180 tablet 1  . pantoprazole (PROTONIX) 40 MG tablet TAKE 1 TABLET BY MOUTH  DAILY. TAKE 30 TO 60  MINUTES BEFORE FIRST MEAL  OF THE DAY 90 tablet 3  . potassium chloride SA (K-DUR) 20 MEQ tablet Take 20 mEq by mouth daily.    . pravastatin (PRAVACHOL) 20 MG tablet Take 20 mg by mouth at bedtime.     . predniSONE (DELTASONE) 10 MG tablet Take 20m tonight, then take 238min  the am and pm for 2 days, then take 20 mg in the am, then 51m in the am, then stop. 13 tablet 0  . pregabalin (LYRICA) 100 MG capsule Take 100 mg by mouth 3 (three) times daily.    .Marland KitchenPROAIR HFA 108 (90 Base) MCG/ACT inhaler Inhale 2 puffs into the lungs every 4 (four) hours as needed for wheezing or shortness of breath. 56 g 1  . Respiratory Therapy Supplies (NEBULIZER) DEVI Use as directed with nebulizer solution. 1 each 1  . Respiratory Therapy Supplies (NEBULIZER/TUBING/MOUTHPIECE) KIT Use as directed with nebulizer machine 1 kit 12  . Semaglutide, 1 MG/DOSE, (OZEMPIC, 1 MG/DOSE,) 2 MG/1.5ML SOPN Take 1 mg by mouth once a week.    . zolpidem (AMBIEN) 10 MG tablet Take 0.5-1 tablets (5-10 mg total) by mouth at bedtime as needed. for sleep 90 tablet 0   No current facility-administered medications for this visit.    Medication Side Effects: None  Allergies:  Allergies  Allergen Reactions  . Cymbalta [Duloxetine Hcl] Other (See Comments)    Caused hair loss  . Depo-Medrol [Methylprednisolone Acetate] Hives    05/29/20 developed hives "all over my back" after MBB; resolved with Benadryl and "a few days."  Can tolerate Dexamethasone/Decadron.  .  Rosuvastatin Calcium Other (See Comments) and Cough    Rapid heart rate  . Almond Meal Itching and Swelling    Tongue swells and itches  . Almond Oil Itching and Swelling    Itching and swelling of tongue   . Carvedilol Cough  . Crestor [Rosuvastatin] Cough  . Fish Allergy Itching    Halibut fish  . Losartan Potassium Cough  . Morphine And Related Itching    Past Medical History:  Diagnosis Date  . Allergic rhinoconjunctivitis   . Allergy history unknown   . Anxiety   . Arthritis    DDD, spondylosis  . Asthma   . Bipolar disorder ([facility]   . Chronic back pain    Has had several surgeries.  He uses a walker  . Chronic kidney disease    renal calculi  . Depression   . Diabetes mellitus without complication ([place]    dx in 2007  . Diabetes mellitus, type II ([place]   . Eczema   . Headache(784.0)    sinus related   . History of kidney stones   . Hx of blood clots    lungs  . Hx of pulmonary embolus 2017  . Hyperlipidemia   . Hypertension   . Insomnia   . Knee pain   . Left ankle pain   . Memory change   . Mental disorder   . Mood disorder ([place]   . OSA on CPAP    PSG 05/24/2016: AHI 17/hour 02 mean 76%.  HSAT 06/13/17, AHI 11-hour 02 mean 79%  . Recurrent upper respiratory infection (URI)   . Sleep apnea   . Tremor of both hands     Family History  Problem Relation Age of Onset  . Diabetes Sister   . Diabetes Brother   . Hypertension Brother   . Cancer Maternal Grandmother   . Heart failure Maternal Grandmother   . Hypertension Paternal Grandmother   . Asthma Daughter   . Cancer Daughter   . Depression Daughter   . Hypertension Daughter   . Heart failure Maternal Aunt   . Pneumonia Mother   . Diabetes Mother   . Alcoholism Father   . Alcohol abuse Father   . Depression  Daughter   . Schizophrenia Grandchild   . Allergies Neg Hx   . Eczema Neg Hx   . Immunodeficiency Neg Hx     Social History   Socioeconomic History  . Marital status: Single     Spouse name: Not on file  . Number of children: 2  . Years of education: Not on file  . Highest education level: Some college, no degree  Occupational History    Comment: NA  Tobacco Use  . Smoking status: Passive Smoke Exposure - Never Smoker  . Smokeless tobacco: Never Used  . Tobacco comment: grandson smokes, but in his car  Vaping Use  . Vaping Use: Never used  Substance and Sexual Activity  . Alcohol use: Yes    Comment: rare use  . Drug use: No  . Sexual activity: Not Currently  Other Topics Concern  . Not on file  Social History Narrative   She is currently single.  Has 2 girls.  Has some college education.  Currently disabled due to chronic back pain and not employed.   07/01/19 lives with brother Shanon Brow   Caffeine, none   Social Determinants of Health   Financial Resource Strain:   . Difficulty of Paying Living Expenses: Not on file  Food Insecurity:   . Worried About Charity fundraiser in the Last Year: Not on file  . Ran Out of Food in the Last Year: Not on file  Transportation Needs:   . Lack of Transportation (Medical): Not on file  . Lack of Transportation (Non-Medical): Not on file  Physical Activity:   . Days of Exercise per Week: Not on file  . Minutes of Exercise per Session: Not on file  Stress:   . Feeling of Stress : Not on file  Social Connections:   . Frequency of Communication with Friends and Family: Not on file  . Frequency of Social Gatherings with Friends and Family: Not on file  . Attends Religious Services: Not on file  . Active Member of Clubs or Organizations: Not on file  . Attends Archivist Meetings: Not on file  . Marital Status: Not on file  Intimate Partner Violence:   . Fear of Current or Ex-Partner: Not on file  . Emotionally Abused: Not on file  . Physically Abused: Not on file  . Sexually Abused: Not on file    Past Medical History, Surgical history, Social history, and Family history were reviewed and updated as  appropriate.   Please see review of systems for further details on the patient's review from today.   Objective:   Physical Exam:  There were no vitals taken for this visit.  Physical Exam Neurological:     Mental Status: She is alert and oriented to person, place, and time.     Cranial Nerves: No dysarthria.  Psychiatric:        Attention and Perception: Attention and perception normal.        Mood and Affect: Mood is anxious and depressed.        Speech: Speech normal.        Behavior: Behavior is cooperative.        Thought Content: Thought content normal. Thought content is not paranoid or delusional. Thought content does not include homicidal or suicidal ideation. Thought content does not include homicidal or suicidal plan.        Cognition and Memory: Cognition and memory normal.        Judgment: Judgment normal.  Comments: Insight intact     Lab Review:     Component Value Date/Time   NA 139 07/06/2020 0834   K 4.2 07/06/2020 0834   CL 100 07/06/2020 0834   CO2 26 07/06/2020 0834   GLUCOSE 277 (H) 07/06/2020 0834   GLUCOSE 120 (H) 08/14/2019 0859   BUN 13 07/06/2020 0834   CREATININE 0.62 07/06/2020 0834   CALCIUM 10.2 07/06/2020 0834   PROT 7.3 04/28/2019 1712   ALBUMIN 4.2 04/28/2019 1712   AST 21 04/28/2019 1712   ALT 30 04/28/2019 1712   ALKPHOS 68 04/28/2019 1712   BILITOT 0.5 04/28/2019 1712   GFRNONAA 94 07/06/2020 0834   GFRAA 108 07/06/2020 0834       Component Value Date/Time   WBC 4.8 08/14/2019 0859   RBC 4.86 08/14/2019 0859   HGB 12.8 08/14/2019 0859   HGB 12.5 11/08/2018 1041   HCT 41.9 08/14/2019 0859   HCT 39.5 11/08/2018 1041   PLT 289 08/14/2019 0859   MCV 86.2 08/14/2019 0859   MCV 81 11/08/2018 1041   MCH 26.3 08/14/2019 0859   MCHC 30.5 08/14/2019 0859   RDW 14.9 08/14/2019 0859   RDW 14.3 11/08/2018 1041   LYMPHSABS 2.1 04/28/2019 1712   LYMPHSABS 1.4 11/08/2018 1041   MONOABS 0.5 04/28/2019 1712   EOSABS 0.3 04/28/2019  1712   EOSABS 0.2 11/08/2018 1041   BASOSABS 0.0 04/28/2019 1712   BASOSABS 0.0 11/08/2018 1041    No results found for: POCLITH, LITHIUM   No results found for: PHENYTOIN, PHENOBARB, VALPROATE, CBMZ   .res Assessment: Plan:   Discussed potential benefits, risk, and side effects of benzodiazepines to include potential risk of tolerance and dependence, as well as possible drowsiness.  Advised patient not to drive if experiencing drowsiness and to take lowest possible effective dose to minimize risk of dependence and tolerance. Advised pt not to combine Klonopin with Ambien. Discussed that she could try taking Klonopin instead of Ambien for insomnia to determine if this is more effective for her insomnia.  Will start Klonopin 0.5 mg 1/2-1 tab po TID prn anxiety.  Continue all other medications as prescribed. Recommend continuing psychotherapy with Rinaldo Cloud, LCSW. Pt to f/u in 2 weeks or sooner if clinically indicated.  Patient advised to contact office with any questions, adverse effects, or acute worsening in signs and symptoms.  Kasmira was seen today for anxiety, panic attack and insomnia.  Diagnoses and all orders for this visit:  Panic disorder -     clonazePAM (KLONOPIN) 0.5 MG tablet; Take 1/2-1 tab po TID prn anxiety. (Do not combine with Ambien)  Anxiety disorder, unspecified type  Primary insomnia    Please see After Visit Summary for patient specific instructions.  Future Appointments  Date Time Provider [place]  08/11/2020  9:00 AM Shanon Ace, LCSW [facility] None  08/14/2020  9:30 AM Thayer Headings, PMHNP [facility] None  08/24/2020  9:20 AM Leonie Man, MD [facility]  08/25/2020  9:00 AM Shanon Ace, LCSW [facility] None  09/08/2020  9:00 AM Shanon Ace, LCSW [facility] None  09/22/2020  9:00 AM Shanon Ace, LCSW [facility] None    No orders of the defined types were placed in this encounter.     -------------------------------

## 2020-08-02 ENCOUNTER — Other Ambulatory Visit: Payer: Self-pay | Admitting: Internal Medicine

## 2020-08-04 ENCOUNTER — Institutional Professional Consult (permissible substitution): Payer: 59 | Admitting: Diagnostic Neuroimaging

## 2020-08-06 NOTE — Telephone Encounter (Signed)
PA was approved from 08/06/2020 to 08/01/2021. Confirmation number is [id number]

## 2020-08-06 NOTE — Telephone Encounter (Signed)
Called patient to inform that the PA for Levocetirizine has been approved. Patient verbalized understanding. She did state that she now has an enlarged aorta and is wondering if any of the medications we prescribe to here would affect this. She did state that she has an upcoming appointment with her cardiologist on the 20th of this month. I did advise to ask her cardiologist as well but that I would check and see before hand. Patient verbalize understanding. Please advise.

## 2020-08-06 NOTE — Telephone Encounter (Signed)
Called and advised to patient. Patient verbalized understanding.  

## 2020-08-06 NOTE — Telephone Encounter (Signed)
No, none of her asthma or allergy medication should have any impact on an enlarged aorta.  She should however discuss her medications with the cardiologist.

## 2020-08-09 ENCOUNTER — Other Ambulatory Visit: Payer: Self-pay | Admitting: Internal Medicine

## 2020-08-10 ENCOUNTER — Other Ambulatory Visit: Payer: Self-pay

## 2020-08-10 ENCOUNTER — Encounter: Payer: Self-pay | Admitting: Emergency Medicine

## 2020-08-10 ENCOUNTER — Ambulatory Visit
Admission: EM | Admit: 2020-08-10 | Discharge: 2020-08-10 | Disposition: A | Discharge status: Home / Self Care | Payer: 59 | Attending: Family Medicine | Admitting: Family Medicine

## 2020-08-10 DIAGNOSIS — H66004 Acute suppurative otitis media without spontaneous rupture of ear drum, recurrent, right ear: Secondary | ICD-10-CM

## 2020-08-10 DIAGNOSIS — J0100 Acute maxillary sinusitis, unspecified: Secondary | ICD-10-CM | POA: Diagnosis not present

## 2020-08-10 DIAGNOSIS — Z20822 Contact with and (suspected) exposure to covid-19: Secondary | ICD-10-CM

## 2020-08-10 MED ORDER — CEPHALEXIN 500 MG PO CAPS
500.0000 mg | ORAL_CAPSULE | Freq: Three times a day (TID) | ORAL | 0 refills | Status: AC
Start: 2020-08-10 — End: 2020-08-20

## 2020-08-10 MED ORDER — NEOMYCIN-POLYMYXIN-HC 3.5-10000-1 OT SUSP: 3.0000 [drp] | Freq: Three times a day (TID) | OTIC | 0 refills | Status: AC

## 2020-08-10 NOTE — ED Triage Notes (Signed)
Woke this morning with headache, right ear pain and soreness into throat.  No fever

## 2020-08-10 NOTE — ED Provider Notes (Signed)
[name]    CSN: [id number] Arrival date & time: 08/10/20  1424      History   Chief Complaint Chief Complaint  Patient presents with  . Otalgia    HPI Chelsea Benson is a 69 y.o. female.   HPI Patient presents with acute sinus congestion and bilateral ear pain x 4 days. She is afebrile. Denies any contact with anyone with symptoms of COVID-19 or known person that is positive for COVID. She has take multiple otc medications for management of nasal congestion and facial pressure symptoms without relief of symptoms.   Past Medical History:  Diagnosis Date  . Allergic rhinoconjunctivitis   . Allergy history unknown   . Anxiety   . Arthritis    DDD, spondylosis  . Asthma   . Bipolar disorder ([place])   . Chronic back pain    Has had several surgeries.  He uses a walker  . Chronic kidney disease    renal calculi  . Depression   . Diabetes mellitus without complication ([place])    dx in 2007  . Diabetes mellitus, type II ([place])   . Eczema   . Headache(784.0)    sinus related   . History of kidney stones   . Hx of blood clots    lungs  . Hx of pulmonary embolus 2017  . Hyperlipidemia   . Hypertension   . Insomnia   . Knee pain   . Left ankle pain   . Memory change   . Mental disorder   . Mood disorder ([place])   . OSA on CPAP    PSG 05/24/2016: AHI 17/hour 02 mean 76%.  HSAT 06/13/17, AHI 11-hour 02 mean 79%  . Recurrent upper respiratory infection (URI)   . Sleep apnea   . Tremor of both hands     Patient Active Problem List   Diagnosis Date Noted  . Chest pain at rest 06/17/2020  . Rapid palpitations 06/17/2020  . Acute pharyngitis 01/13/2020  . Cough variant asthma vs UACS 06/06/2019  . Moderate persistent asthma with acute exacerbation 03/13/2019  . Allergic rhinitis due to allergen 03/13/2019  . Allergic conjunctivitis of both eyes 03/13/2019  . 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  . Anxiety hyperventilation 10/03/2018    . Bipolar II disorder ([place]) 09/18/2018  . Cardiac risk counseling 01/07/2018  . Influenza 10/19/2017  . Cough 10/19/2017  . Allergy to almonds 10/19/2017  . Flexural atopic dermatitis 10/19/2017  . Allergic rhinoconjunctivitis 10/19/2017  . Moderate persistent asthma, uncomplicated 20/94/7096  . S/P arthroscopy of right knee 06/04/2017  . Acute blood loss as cause of postoperative anemia 06/04/2017  . Mood disorder ([place])   . Allergy history unknown   . Anxiety 05/24/2017  . Diabetes mellitus ([place]) 05/24/2017  . Hyperlipidemia with target LDL less than 100 05/24/2017  . Insomnia 05/24/2017  . Primary osteoarthritis of right knee 05/22/2017  . Osteoarthritis of right knee 05/18/2017  . Allergic rhinitis due to pollen 10/06/2014  . Arthritis 10/06/2014  . Asthma 10/06/2014  . Cubital tunnel syndrome, right 10/06/2014  . Headache 10/06/2014  . Essential hypertension 10/06/2014  . Depression 08/07/2014  . Postlaminectomy syndrome, lumbar region 09/06/2013  . DDD (degenerative disc disease), lumbosacral 05/11/2013  . Spinal stenosis of lumbar region 05/11/2013    Past Surgical History:  Procedure Laterality Date  . ABDOMINAL HYSTERECTOMY    . BACK SURGERY     2012- lumbar fusion  . BREAST SURGERY  L cyst removed - 1972  . BUNIONECTOMY Left    Great toe  . calf -R- cyst removed    . CARPAL TUNNEL RELEASE     both hands   . HAMMER TOE SURGERY Left    L foot  . NASAL SINUS SURGERY    . NM MYOVIEW LTD  12/2008   Normal study.  Normal LV function.  EF 61%.  Apical thinning but no ischemia or infarction.  Marland Kitchen SHOULDER ARTHROSCOPY Left    R shoulder- RCR  . TOE SURGERY    . TOTAL KNEE ARTHROPLASTY Right 05/22/2017   Procedure: TOTAL KNEE ARTHROPLASTY;  Surgeon: Frederik Pear, MD;  Location: [facility];  Service: Orthopedics;  Laterality: Right;  . TRANSTHORACIC ECHOCARDIOGRAM  06/2009    Technically difficult.  Moderate concentric LVH with normal LV function.  No regional wall  motion normalities.  EF> 55%.  Normal right ventricle.  No valve lesions.  Normal filling pressures.  Marland Kitchen TRIGGER FINGER RELEASE     L thumb  . TUBAL LIGATION      OB History   No obstetric history on file.      Home Medications    Prior to Admission medications   Medication Sig Start Date End Date Taking? Authorizing Provider  ACCU-CHEK GUIDE test strip TEST BID PRN 09/17/18   [provider]  albuterol (PROVENTIL) (2.5 MG/3ML) 0.083% nebulizer solution USE 1 VIAL VIA NEBULIZER EVERY 4 HRS AS NEEDED FOR WHEEZING OR SHORTNESS OF BREATH. MANUFACTURER RECOMMENDS NOT EXCEEDING 4 VIALS PER DAY 05/27/20   Kennith Gain, MD  albuterol (VENTOLIN HFA) 108 (90 Base) MCG/ACT inhaler Inhale 2 puffs into the lungs every 4 (four) hours as needed. 03/12/20   Kennith Gain, MD  ARIPiprazole (ABILIFY) 2 MG tablet Take 2 tablets (4 mg total) by mouth daily. 08/14/20 11/12/20  Thayer Headings, PMHNP  aspirin EC 81 MG tablet Take 81 mg by mouth daily.    [provider]  azelastine (ASTELIN) 0.1 % nasal spray Place 1 spray into both nostrils 2 (two) times daily. 06/11/20   Kennith Gain, MD  azelastine (OPTIVAR) 0.05 % ophthalmic solution INSTILL 1 DROP INTO BOTH  EYES TWICE DAILY 05/18/20   Kennith Gain, MD  azithromycin (ZITHROMAX) 250 MG tablet Take 2 tablets by mouth once on day one. On days 2-5 take 1 tablet by mouth once daily. Patient not taking: Reported on 07/16/2020 06/24/20   Kennith Gain, MD  Blood Glucose Monitoring Suppl (ACCU-CHEK GUIDE) w/Device KIT See admin instructions. 09/18/18   [provider]  budesonide-formoterol (SYMBICORT) 160-4.5 MCG/ACT inhaler Inhale 2 puffs into the lungs in the morning and at bedtime. 06/11/20   Kennith Gain, MD  busPIRone (BUSPAR) 30 MG tablet Take 1 tablet (30 mg total) by mouth 2 (two) times daily. 08/14/20 11/12/20  Thayer Headings, PMHNP  cephALEXin (KEFLEX) 500 MG  capsule Take 1 capsule (500 mg total) by mouth 3 (three) times daily for 10 days. 08/10/20 08/20/20  Scot Jun, FNP  CLOBETASOL PROPIONATE E 0.05 % emollient cream Apply 1 application topically 2 (two) times daily as needed (rash).  01/22/19   [provider]  clonazePAM (KLONOPIN) 0.5 MG tablet Take 1/2-1 tab po TID prn anxiety. (Do not combine with Ambien) 07/29/20   Thayer Headings, PMHNP  desonide (DESOWEN) 0.05 % ointment APPLY TO AFFECTED AREA(S)  TOPICALLY TWICE DAILY AS  NEEDED 05/27/20   Kennith Gain, MD  diltiazem (CARDIZEM CD) 180 MG 24  hr capsule Take 180 mg by mouth daily.  07/02/19   [provider]  EPINEPHRINE 0.3 mg/0.3 mL IJ SOAJ injection INJECT CONTENTS OF 1 PEN AS NEEDED FOR ALLERGIC  RESPONSE AS DIRECTED BY MD. [place] ATTENTION  AFTER USE. 05/27/20   Kennith Gain, MD  famotidine (PEPCID) 20 MG tablet TAKE 1 TABLET BY MOUTH  DAILY AFTER SUPPER. 01/06/20   Tanda Rockers, MD  fluticasone (FLONASE) 50 MCG/ACT nasal spray USE 2 SPRAYS IN BOTH  NOSTRILS DAILY 06/11/20   Kennith Gain, MD  hydrochlorothiazide (HYDRODIURIL) 25 MG tablet Take 25 mg by mouth daily.  05/06/14   [provider]  hydrOXYzine (ATARAX/VISTARIL) 25 MG tablet TAKE 1 TABLET BY MOUTH  EVERY 6 HOURS AS NEEDED 07/27/20   Thayer Headings, PMHNP  levocetirizine (XYZAL) 5 MG tablet Take 1 tablet (5 mg total) by mouth in the morning and at bedtime. 06/11/20   Kennith Gain, MD  Mepolizumab (NUCALA) 100 MG/ML SOAJ Inject 100 mg into the skin every 28 (twenty-eight) days. 02/05/20   Kennith Gain, MD  metFORMIN (GLUCOPHAGE-XR) 500 MG 24 hr tablet Take 500 mg by mouth 1 day or 1 dose.  07/02/19   [provider]  metoprolol tartrate (LOPRESSOR) 50 MG tablet Take 2 tablets (100 mg total) by mouth once for 1 dose. TAKE TWO HOURS PRIOR TO  SCHEDULE CARDIAC TEST 06/17/20 06/17/20  Leonie Man, MD  mirtazapine (REMERON) 30 MG tablet  TAKE 1 TABLET BY MOUTH AT  BEDTIME 01/16/20   Thayer Headings, PMHNP  montelukast (SINGULAIR) 10 MG tablet TAKE 1 TABLET BY MOUTH AT  BEDTIME 05/27/20   Kennith Gain, MD  neomycin-polymyxin-hydrocortisone (CORTISPORIN) 3.5-10000-1 OTIC suspension Place 3 drops into the right ear in the morning, at noon, and at bedtime for 10 days. 08/10/20 08/20/20  Scot Jun, FNP  Oxcarbazepine (TRILEPTAL) 300 MG tablet Take 2 tablets (600 mg total) by mouth at bedtime. 08/14/20 02/10/21  Thayer Headings, PMHNP  pantoprazole (PROTONIX) 40 MG tablet TAKE 1 TABLET BY MOUTH  DAILY. TAKE 30 TO 60  MINUTES BEFORE FIRST MEAL  OF THE DAY 10/25/19   Tanda Rockers, MD  potassium chloride SA (K-DUR) 20 MEQ tablet Take 20 mEq by mouth daily.    [provider]  pravastatin (PRAVACHOL) 20 MG tablet Take 20 mg by mouth at bedtime.  02/03/17   [provider]  predniSONE (DELTASONE) 10 MG tablet Take 82m tonight, then take 241min the am and pm for 2 days, then take 20 mg in the am, then 1053mn the am, then stop. 07/10/20   PadKennith GainD  pregabalin (LYRICA) 100 MG capsule Take 100 mg by mouth 3 (three) times daily.    [provider]  PROAIR HFA 108 ([id number]) MCG/ACT inhaler Inhale 2 puffs into the lungs every 4 (four) hours as needed for wheezing or shortness of breath. 03/16/20   PadKennith GainD  Respiratory Therapy Supplies (NEBULIZER) DEVI Use as directed with nebulizer solution. 06/01/18   GalValentina ShaggyD  Respiratory Therapy Supplies (NEBULIZER/TUBING/MOUTHPIECE) KIT Use as directed with nebulizer machine 04/17/20   [place]gett, ShaRae HalstedD  Semaglutide, 1 MG/DOSE, (OZEMPIC, 1 MG/DOSE,) 2 MG/1.5ML SOPN Take 1 mg by mouth once a week.    [provider]  Suvorexant (BELSOMRA) 20 MG TABS Take 20 mg by mouth at bedtime. 07/14/20   CarThayer HeadingsMHNP  zolpidem (AMBIEN) 10 MG tablet Take 0.5-1 tablets (  5-10 mg total) by mouth at bedtime  as needed. for sleep 07/29/20   Thayer Headings, PMHNP    Family History Family History  Problem Relation Age of Onset  . Diabetes Sister   . Diabetes Brother   . Hypertension Brother   . Cancer Maternal Grandmother   . Heart failure Maternal Grandmother   . Hypertension Paternal Grandmother   . Asthma Daughter   . Cancer Daughter   . Depression Daughter   . Hypertension Daughter   . Heart failure Maternal Aunt   . Pneumonia Mother   . Diabetes Mother   . Alcoholism Father   . Alcohol abuse Father   . Depression Daughter   . Schizophrenia Grandchild   . Allergies Neg Hx   . Eczema Neg Hx   . Immunodeficiency Neg Hx     Social History Social History   Tobacco Use  . Smoking status: Passive Smoke Exposure - Never Smoker  . Smokeless tobacco: Never Used  . Tobacco comment: grandson smokes, but in his car  Vaping Use  . Vaping Use: Never used  Substance Use Topics  . Alcohol use: Yes    Comment: rare use  . Drug use: No     Allergies   Cymbalta [duloxetine hcl], Depo-medrol [methylprednisolone acetate], Rosuvastatin calcium, Almond meal, Almond oil, Carvedilol, Crestor [rosuvastatin], Fish allergy, Losartan potassium, and Morphine and related   Review of Systems Review of Systems Pertinent negatives listed in HPI Physical Exam Triage Vital Signs ED Triage Vitals  Enc Vitals Group     BP 08/10/20 1647 (!) 195/95     Pulse Rate 08/10/20 1647 72     Resp 08/10/20 1647 18     Temp 08/10/20 1647 98 F (36.7 C)     Temp Source 08/10/20 1647 Oral     SpO2 08/10/20 1647 97 %     Weight --      Height --      Head Circumference --      Peak Flow --      Pain Score 08/10/20 1645 7     Pain Loc --      Pain Edu? --      Excl. in [place]? --    No data found.  Updated Vital Signs Blood Pressure (Abnormal) 168/110 (BP Location: Left Arm)   Pulse 74   Temperature 98 F (36.7 C) (Oral)   Respiration 18   Oxygen Saturation 97%   Visual Acuity Right Eye  Distance:   Left Eye Distance:   Bilateral Distance:    Right Eye Near:   Left Eye Near:    Bilateral Near:     Physical Exam General Appearance:    Alert, cooperative, no distress  HENT:  sinus tenderness, sinus congestion, and right TM red, dull, bulging  Eyes:    PERRL, conjunctiva/corneas clear, EOM's intact       Lungs:     Clear to auscultation bilaterally, respirations unlabored  Heart:    Regular rate and rhythm  Neurologic:   Awake, alert, oriented x 3. No apparent focal neurological           defect.         UC Treatments / Results  Labs (all labs ordered are listed, but only abnormal results are displayed) Labs Reviewed  NOVEL CORONAVIRUS, NAA   Narrative:    Performed at:  [address] [place], [place]  [id number] Lab Director: Rush Farmer MD, Phone:  [phone]  SARS-COV-2, NAA 2 DAY TAT   Narrative:    Performed at:  [facility] [address], [place], [place]  [id number] Lab Director: Rush Farmer MD, Phone:  [phone]    EKG   Radiology No results found.  Procedures Procedures (including critical care time)  Medications Ordered in UC Medications - No data to display  Initial Impression / Assessment and Plan / UC Course  I have reviewed the triage vital signs and the nursing notes.  Pertinent labs & imaging results that were available during my care of the patient were reviewed by me and considered in my medical decision making (see chart for details).    Acute otitis media with associated sinus symptoms. Treatment per orders. COVID-19 test is pending. Red flags discussed. Complete all medication as prescribed. Final Clinical Impressions(s) / UC Diagnoses   Final diagnoses:  Recurrent acute suppurative otitis media of right ear without spontaneous rupture of tympanic membrane  Suspected COVID-19 virus infection  Acute non-recurrent maxillary sinusitis   Discharge Instructions   None    ED  Prescriptions    Medication Sig Dispense Auth. Provider   cephALEXin (KEFLEX) 500 MG capsule Take 1 capsule (500 mg total) by mouth 3 (three) times daily for 10 days. 30 capsule Scot Jun, FNP   neomycin-polymyxin-hydrocortisone (CORTISPORIN) 3.5-10000-1 OTIC suspension Place 3 drops into the right ear in the morning, at noon, and at bedtime for 10 days. 10 mL Scot Jun, FNP     PDMP not reviewed this encounter.   Scot Jun, FNP 08/14/20 [phone]

## 2020-08-11 ENCOUNTER — Telehealth (INDEPENDENT_AMBULATORY_CARE_PROVIDER_SITE_OTHER): Payer: 59 | Admitting: Psychiatry

## 2020-08-11 DIAGNOSIS — F419 Anxiety disorder, unspecified: Secondary | ICD-10-CM | POA: Diagnosis not present

## 2020-08-11 NOTE — Progress Notes (Signed)
[facility] Counselor/Therapist Progress Note  Patient ID: Chelsea Benson, MRN: [id number],    Date: 08/11/2020  Time Spent: 60 minutes  8:55am to 9:55am  Virtual Visit Note via MyChart Video Connected with patient by a video enabled telemedicine/telehealth application or telephone, with their informed consent, and verified patient privacy and that I am speaking with the correct person using two identifiers. I discussed the limitations, risks, security and privacy concerns of performing psychotherapy and management service by telephone and the availability of in person appointments. I also discussed with the patient that there may be a patient responsible charge related to this service. The patient expressed understanding and agreed to proceed. I discussed the treatment planning with the patient. The patient was provided an opportunity to ask questions and all were answered. The patient agreed with the plan and demonstrated an understanding of the instructions. The patient was advised to call  our office if  symptoms worsen or feel they are in a crisis state and need immediate contact.   Therapist Location: [facility] Patient Location: home  Treatment Type: Individual Therapy  Reported Symptoms: anxiety, depression has decreased, frustrated and not feeling as well due to her ear infection for which she is being treated  Mental Status Exam:  Appearance:   Neat     Behavior:  Appropriate and Sharing  Motor:  Normal  Speech/Language:   Clear and Coherent  Affect:  anxious  Mood:  anxious  Thought process:  normal  Thought content:    WNL  Sensory/Perceptual disturbances:    WNL  Orientation:  oriented to person, place, time/date, situation, day of week, month of year and year  Attention:  Good  Concentration:  Good and Fair  Memory:  WNL  Fund of knowledge:   Good  Insight:    Good and Fair  Judgment:   Good and Fair  Impulse Control:  Good   Risk  Assessment: Danger to Self:  No Self-injurious Behavior: No Danger to Others: No Duty to Warn:no Physical Aggression / Violence:No  Access to Firearms a concern: No  Gang Involvement:No   Subjective:  Patient today reports anxiety, decreased depression, frustrated with having an ear infection and being treated for this.  Also just recently had Covid test done but no results yet.  Interventions: Solution-Oriented/Positive Psychology and Ego-Supportive  Diagnosis:   ICD-10-CM   1. Anxiety disorder, unspecified type  F41.9     Plan: Patient not signing tx goals on computer screen due to COVID.  Treatment Goals:  Goals remain on tx plan as patient works on strategies to meet her goals. Progress will be documented each visit in "Progress" section on Plan.   Long term goal:(measurable) Reduce overall level, frequency, and intensity of the anxiety so that daily functioning is not impaired.* Patient will eventually report a rating of "4"or less" on 1-10 anxiety scale for a55-month time period where her daily functioning is not impaired.  Short term goal: Increase understanding of beliefs and messages that produce the worry and anxiety.  Strategy: Patient will explore and work to stop cognitive messages that feed anxiety and work to replace them with more positive and empowering messages.   PROGRESS: Patient today reporting anxiety, frustration, and decreased depression. Not feeling well due to an ear infection for which she is being treated. Sleep better with addition of recent med (Clonazepam).  Anxiety heightened some with her ear infection and not having results yet of her Covid test. Remains very frustrated  about Covid and how it limits her getting out safely, and very anxious about possibility of "catching it". Also anxious re: other medical issues as noted in previous sessions. Reports grandson is going to return to live with patient and patient's brother. Still working  on "Not always looking for what may go wrong, versus right", as that tends to fuel her anxiety. Still has self-talk that is anxiety and assuming "I can't do this or that", when in reality a lot of things patient can do if she works some at it.  Trying to let go of negative assumptions and have some optimism which she can do with support but hard to maintain. Also, today reviews boundary issues with her brother that lives with her. Encouraged her in positive self-care including more positive self-talk, letting go of fears that she knows are unrealistic at times, good nutrition, walking as able, enjoying her grandkids, staying in contact with supportive people, and using appropriate boundaries with others as needed.  Goal review and progress/challenges noted with patient.  Next appt within 3 weeks.   Mathis Fare, LCSW

## 2020-08-12 LAB — NOVEL CORONAVIRUS, NAA: SARS-CoV-2, NAA: NOT DETECTED

## 2020-08-12 LAB — SARS-COV-2, NAA 2 DAY TAT

## 2020-08-14 ENCOUNTER — Other Ambulatory Visit: Payer: Self-pay

## 2020-08-14 ENCOUNTER — Telehealth: Payer: Self-pay | Admitting: Psychiatry

## 2020-08-14 ENCOUNTER — Telehealth (INDEPENDENT_AMBULATORY_CARE_PROVIDER_SITE_OTHER): Payer: 59 | Admitting: Psychiatry

## 2020-08-14 ENCOUNTER — Encounter: Payer: Self-pay | Admitting: Psychiatry

## 2020-08-14 DIAGNOSIS — F419 Anxiety disorder, unspecified: Secondary | ICD-10-CM

## 2020-08-14 DIAGNOSIS — F5101 Primary insomnia: Secondary | ICD-10-CM

## 2020-08-14 DIAGNOSIS — F3181 Bipolar II disorder: Secondary | ICD-10-CM | POA: Diagnosis not present

## 2020-08-14 MED ORDER — OXCARBAZEPINE 300 MG PO TABS: 600.0000 mg | ORAL_TABLET | Freq: Every day | ORAL | 0 refills | Status: DC

## 2020-08-14 MED ORDER — ZOLPIDEM TARTRATE 10 MG PO TABS: 5.0000 mg | ORAL_TABLET | Freq: Every evening | ORAL | 0 refills | Status: DC | PRN

## 2020-08-14 MED ORDER — BUSPIRONE HCL 30 MG PO TABS: 30.0000 mg | ORAL_TABLET | Freq: Two times a day (BID) | ORAL | 0 refills | Status: DC

## 2020-08-14 MED ORDER — ARIPIPRAZOLE 2 MG PO TABS: 4.0000 mg | ORAL_TABLET | Freq: Every day | ORAL | 0 refills | Status: DC

## 2020-08-14 NOTE — Telephone Encounter (Signed)
Pt called to report [facility] is out of Oxcarbazepine. Send new Rx to [facility] [address] for 30 day until [facility] can get more in stock. Pt # [phone]  Also, requesting refill for Ambien to go to [facility] mail order.

## 2020-08-14 NOTE — Telephone Encounter (Signed)
Oxcarbazepine sent for 30 day supply to [facility].   Will pend Rx for Ambien to send to mail order [facility]

## 2020-08-14 NOTE — Progress Notes (Signed)
Chelsea Benson [id number] 24-Mar-1951 69 y.o.  Virtual Visit via Telephone Note  I connected with pt on 08/14/20 at  9:30 AM EDT by telephone and verified that I am speaking with the correct person using two identifiers.   I discussed the limitations, risks, security and privacy concerns of performing an evaluation and management service by telephone and the availability of in person appointments. I also discussed with the patient that there may be a patient responsible charge related to this service. The patient expressed understanding and agreed to proceed.   I discussed the assessment and treatment plan with the patient. The patient was provided an opportunity to ask questions and all were answered. The patient agreed with the plan and demonstrated an understanding of the instructions.   The patient was advised to call back or seek an in-person evaluation if the symptoms worsen or if the condition fails to improve as anticipated.  I provided 15 minutes of non-face-to-face time during this encounter.  The patient was located at home.  The provider was located at [place].   Thayer Headings, PMHNP   Subjective:   Patient ID:  Chelsea Benson is a 69 y.o. (DOB [birth year]) female.  Chief Complaint:  Chief Complaint  Patient presents with  . Follow-up    h/o anxiety, mood disturbance, insomnia    HPI Chelsea Benson presents for follow-up of anxiety, insomnia, and mood disturbance. She reports, "I've been doing great." She reports that she has been able to think more clearly. She reports that Klonopin has been helpful for her anxiety. Taking Klonopin prn only as needed and some days is taking it once a day and some days is not needing to take it. Denies any recent panic attacks. She reports that her worry is "non-existent now." She reports that her sleep has significantly improved. Falls asleep around 9-9:30 pm and awakens at 6:30 am and then sleeps some more until about  9 am. Has been napping during the day. "I am more relaxed." Denies depressed mood. Denies irritability. Energy and motivation has been good. Denies impulsive or risky behavior. Appetite has been ok. Concentration has been good. Denies SI.   Has been taking classes.    Past Psychiatric Medication Trials: Cymbalta-hair loss Mirtazapine Prozac Trazodone-ineffective Ambien BuSpar Hydroxyzine Abilify Trileptal Xanax   Review of Systems:  Review of Systems  HENT: Positive for ear pain.   Respiratory: Negative for shortness of breath.   Musculoskeletal: Positive for back pain.  Psychiatric/Behavioral:       Please refer to HPI   Had to take another COVID test recently and it was negative.  Medications: I have reviewed the patient's current medications.  Current Outpatient Medications  Medication Sig Dispense Refill  . Suvorexant (BELSOMRA) 20 MG TABS Take 20 mg by mouth at bedtime. 90 tablet 0  . ACCU-CHEK GUIDE test strip TEST BID PRN  1  . albuterol (PROVENTIL) (2.5 MG/3ML) 0.083% nebulizer solution USE 1 VIAL VIA NEBULIZER EVERY 4 HRS AS NEEDED FOR WHEEZING OR SHORTNESS OF BREATH. MANUFACTURER RECOMMENDS NOT EXCEEDING 4 VIALS PER DAY 270 mL 1  . albuterol (VENTOLIN HFA) 108 (90 Base) MCG/ACT inhaler Inhale 2 puffs into the lungs every 4 (four) hours as needed. 54 g 1  . ARIPiprazole (ABILIFY) 2 MG tablet Take 2 tablets (4 mg total) by mouth daily. 180 tablet 0  . aspirin EC 81 MG tablet Take 81 mg by mouth daily.    Marland Kitchen azelastine (ASTELIN) 0.1 % nasal spray  Place 1 spray into both nostrils 2 (two) times daily. 90 mL 1  . azelastine (OPTIVAR) 0.05 % ophthalmic solution INSTILL 1 DROP INTO BOTH  EYES TWICE DAILY 12 mL 5  . azithromycin (ZITHROMAX) 250 MG tablet Take 2 tablets by mouth once on day one. On days 2-5 take 1 tablet by mouth once daily. (Patient not taking: Reported on 07/16/2020) 6 each 0  . Blood Glucose Monitoring Suppl (ACCU-CHEK GUIDE) w/Device KIT See admin  instructions.  1  . budesonide-formoterol (SYMBICORT) 160-4.5 MCG/ACT inhaler Inhale 2 puffs into the lungs in the morning and at bedtime. 30.6 g 1  . busPIRone (BUSPAR) 30 MG tablet Take 1 tablet (30 mg total) by mouth 2 (two) times daily. 180 tablet 0  . cephALEXin (KEFLEX) 500 MG capsule Take 1 capsule (500 mg total) by mouth 3 (three) times daily for 10 days. 30 capsule 0  . CLOBETASOL PROPIONATE E 0.05 % emollient cream Apply 1 application topically 2 (two) times daily as needed (rash).     . clonazePAM (KLONOPIN) 0.5 MG tablet Take 1/2-1 tab po TID prn anxiety. (Do not combine with Ambien) 90 tablet 0  . desonide (DESOWEN) 0.05 % ointment APPLY TO AFFECTED AREA(S)  TOPICALLY TWICE DAILY AS  NEEDED 180 g 1  . diltiazem (CARDIZEM CD) 180 MG 24 hr capsule Take 180 mg by mouth daily.     Marland Kitchen EPINEPHRINE 0.3 mg/0.3 mL IJ SOAJ injection INJECT CONTENTS OF 1 PEN AS NEEDED FOR ALLERGIC  RESPONSE AS DIRECTED BY MD. SEEK MEDICAL ATTENTION  AFTER USE. 4 each 1  . famotidine (PEPCID) 20 MG tablet TAKE 1 TABLET BY MOUTH  DAILY AFTER SUPPER. 90 tablet 3  . fluticasone (FLONASE) 50 MCG/ACT nasal spray USE 2 SPRAYS IN BOTH  NOSTRILS DAILY 48 g 1  . hydrochlorothiazide (HYDRODIURIL) 25 MG tablet Take 25 mg by mouth daily.     . hydrOXYzine (ATARAX/VISTARIL) 25 MG tablet TAKE 1 TABLET BY MOUTH  EVERY 6 HOURS AS NEEDED 180 tablet 0  . levocetirizine (XYZAL) 5 MG tablet Take 1 tablet (5 mg total) by mouth in the morning and at bedtime. 180 tablet 1  . Mepolizumab (NUCALA) 100 MG/ML SOAJ Inject 100 mg into the skin every 28 (twenty-eight) days. 0.84 mL 11  . metFORMIN (GLUCOPHAGE-XR) 500 MG 24 hr tablet Take 500 mg by mouth 1 day or 1 dose.     . metoprolol tartrate (LOPRESSOR) 50 MG tablet Take 2 tablets (100 mg total) by mouth once for 1 dose. TAKE TWO HOURS PRIOR TO  SCHEDULE CARDIAC TEST 2 tablet 0  . mirtazapine (REMERON) 30 MG tablet TAKE 1 TABLET BY MOUTH AT  BEDTIME 90 tablet 3  . montelukast (SINGULAIR) 10  MG tablet TAKE 1 TABLET BY MOUTH AT  BEDTIME 90 tablet 3  . neomycin-polymyxin-hydrocortisone (CORTISPORIN) 3.5-10000-1 OTIC suspension Place 3 drops into the right ear in the morning, at noon, and at bedtime for 10 days. 10 mL 0  . Oxcarbazepine (TRILEPTAL) 300 MG tablet Take 2 tablets (600 mg total) by mouth at bedtime. 60 tablet 0  . pantoprazole (PROTONIX) 40 MG tablet TAKE 1 TABLET BY MOUTH  DAILY. TAKE 30 TO 60  MINUTES BEFORE FIRST MEAL  OF THE DAY 90 tablet 3  . potassium chloride SA (K-DUR) 20 MEQ tablet Take 20 mEq by mouth daily.    . pravastatin (PRAVACHOL) 20 MG tablet Take 20 mg by mouth at bedtime.     . predniSONE (DELTASONE) 10 MG tablet  Take 59m tonight, then take 259min the am and pm for 2 days, then take 20 mg in the am, then 1094mn the am, then stop. 13 tablet 0  . pregabalin (LYRICA) 100 MG capsule Take 100 mg by mouth 3 (three) times daily.    . PMarland KitchenOAIR HFA 108 (90 Base) MCG/ACT inhaler Inhale 2 puffs into the lungs every 4 (four) hours as needed for wheezing or shortness of breath. 56 g 1  . Respiratory Therapy Supplies (NEBULIZER) DEVI Use as directed with nebulizer solution. 1 each 1  . Respiratory Therapy Supplies (NEBULIZER/TUBING/MOUTHPIECE) KIT Use as directed with nebulizer machine 1 kit 12  . Semaglutide, 1 MG/DOSE, (OZEMPIC, 1 MG/DOSE,) 2 MG/1.5ML SOPN Take 1 mg by mouth once a week.    . zolpidem (AMBIEN) 10 MG tablet Take 0.5-1 tablets (5-10 mg total) by mouth at bedtime as needed. for sleep 90 tablet 0   No current facility-administered medications for this visit.    Medication Side Effects: None  Allergies:  Allergies  Allergen Reactions  . Cymbalta [Duloxetine Hcl] Other (See Comments)    Caused hair loss  . Depo-Medrol [Methylprednisolone Acetate] Hives    05/29/20 developed hives "all over my back" after MBB; resolved with Benadryl and "a few days."  Can tolerate Dexamethasone/Decadron.  . Rosuvastatin Calcium Other (See Comments) and Cough    Rapid  heart rate  . Almond Meal Itching and Swelling    Tongue swells and itches  . Almond Oil Itching and Swelling    Itching and swelling of tongue   . Carvedilol Cough  . Crestor [Rosuvastatin] Cough  . Fish Allergy Itching    Halibut fish  . Losartan Potassium Cough  . Morphine And Related Itching    Past Medical History:  Diagnosis Date  . Allergic rhinoconjunctivitis   . Allergy history unknown   . Anxiety   . Arthritis    DDD, spondylosis  . Asthma   . Bipolar disorder ([place] . Chronic back pain    Has had several surgeries.  He uses a walker  . Chronic kidney disease    renal calculi  . Depression   . Diabetes mellitus without complication ([place]  dx in 2007  . Diabetes mellitus, type II ([place] . Eczema   . Headache(784.0)    sinus related   . History of kidney stones   . Hx of blood clots    lungs  . Hx of pulmonary embolus 2017  . Hyperlipidemia   . Hypertension   . Insomnia   . Knee pain   . Left ankle pain   . Memory change   . Mental disorder   . Mood disorder ([place] . OSA on CPAP    PSG 05/24/2016: AHI 17/hour 02 mean 76%.  HSAT 06/13/17, AHI 11-hour 02 mean 79%  . Recurrent upper respiratory infection (URI)   . Sleep apnea   . Tremor of both hands     Family History  Problem Relation Age of Onset  . Diabetes Sister   . Diabetes Brother   . Hypertension Brother   . Cancer Maternal Grandmother   . Heart failure Maternal Grandmother   . Hypertension Paternal Grandmother   . Asthma Daughter   . Cancer Daughter   . Depression Daughter   . Hypertension Daughter   . Heart failure Maternal Aunt   . Pneumonia Mother   . Diabetes Mother   . Alcoholism Father   .  Alcohol abuse Father   . Depression Daughter   . Schizophrenia Grandchild   . Allergies Neg Hx   . Eczema Neg Hx   . Immunodeficiency Neg Hx     Social History   Socioeconomic History  . Marital status: Single    Spouse name: Not on file  . Number of children: 2  . Years of  education: Not on file  . Highest education level: Some college, no degree  Occupational History    Comment: NA  Tobacco Use  . Smoking status: Passive Smoke Exposure - Never Smoker  . Smokeless tobacco: Never Used  . Tobacco comment: grandson smokes, but in his car  Vaping Use  . Vaping Use: Never used  Substance and Sexual Activity  . Alcohol use: Yes    Comment: rare use  . Drug use: No  . Sexual activity: Not Currently  Other Topics Concern  . Not on file  Social History Narrative   She is currently single.  Has 2 girls.  Has some college education.  Currently disabled due to chronic back pain and not employed.   07/01/19 lives with brother Shanon Brow   Caffeine, none   Social Determinants of Health   Financial Resource Strain:   . Difficulty of Paying Living Expenses: Not on file  Food Insecurity:   . Worried About Charity fundraiser in the Last Year: Not on file  . Ran Out of Food in the Last Year: Not on file  Transportation Needs:   . Lack of Transportation (Medical): Not on file  . Lack of Transportation (Non-Medical): Not on file  Physical Activity:   . Days of Exercise per Week: Not on file  . Minutes of Exercise per Session: Not on file  Stress:   . Feeling of Stress : Not on file  Social Connections:   . Frequency of Communication with Friends and Family: Not on file  . Frequency of Social Gatherings with Friends and Family: Not on file  . Attends Religious Services: Not on file  . Active Member of Clubs or Organizations: Not on file  . Attends Archivist Meetings: Not on file  . Marital Status: Not on file  Intimate Partner Violence:   . Fear of Current or Ex-Partner: Not on file  . Emotionally Abused: Not on file  . Physically Abused: Not on file  . Sexually Abused: Not on file    Past Medical History, Surgical history, Social history, and Family history were reviewed and updated as appropriate.   Please see review of systems for further  details on the patient's review from today.   Objective:   Physical Exam:  There were no vitals taken for this visit.  Physical Exam Neurological:     Mental Status: She is alert and oriented to person, place, and time.     Cranial Nerves: No dysarthria.  Psychiatric:        Attention and Perception: Attention and perception normal.        Mood and Affect: Mood normal.        Speech: Speech normal.        Behavior: Behavior is cooperative.        Thought Content: Thought content normal. Thought content is not paranoid or delusional. Thought content does not include homicidal or suicidal ideation. Thought content does not include homicidal or suicidal plan.        Cognition and Memory: Cognition and memory normal.  Judgment: Judgment normal.     Comments: Insight intact     Lab Review:     Component Value Date/Time   NA 139 07/06/2020 0834   K 4.2 07/06/2020 0834   CL 100 07/06/2020 0834   CO2 26 07/06/2020 0834   GLUCOSE 277 (H) 07/06/2020 0834   GLUCOSE 120 (H) 08/14/2019 0859   BUN 13 07/06/2020 0834   CREATININE 0.62 07/06/2020 0834   CALCIUM 10.2 07/06/2020 0834   PROT 7.3 04/28/2019 1712   ALBUMIN 4.2 04/28/2019 1712   AST 21 04/28/2019 1712   ALT 30 04/28/2019 1712   ALKPHOS 68 04/28/2019 1712   BILITOT 0.5 04/28/2019 1712   GFRNONAA 94 07/06/2020 0834   GFRAA 108 07/06/2020 0834       Component Value Date/Time   WBC 4.8 08/14/2019 0859   RBC 4.86 08/14/2019 0859   HGB 12.8 08/14/2019 0859   HGB 12.5 11/08/2018 1041   HCT 41.9 08/14/2019 0859   HCT 39.5 11/08/2018 1041   PLT 289 08/14/2019 0859   MCV 86.2 08/14/2019 0859   MCV 81 11/08/2018 1041   MCH 26.3 08/14/2019 0859   MCHC 30.5 08/14/2019 0859   RDW 14.9 08/14/2019 0859   RDW 14.3 11/08/2018 1041   LYMPHSABS 2.1 04/28/2019 1712   LYMPHSABS 1.4 11/08/2018 1041   MONOABS 0.5 04/28/2019 1712   EOSABS 0.3 04/28/2019 1712   EOSABS 0.2 11/08/2018 1041   BASOSABS 0.0 04/28/2019 1712    BASOSABS 0.0 11/08/2018 1041    No results found for: POCLITH, LITHIUM   No results found for: PHENYTOIN, PHENOBARB, VALPROATE, CBMZ   .res Assessment: Plan:   Will continue current plan of care since target signs and symptoms are well controlled without any tolerability issues. Patient reports that she does not need a refill of Klonopin as needed at this time.  Encouraged her to continue to use Klonopin as reported, with using it only as needed. Recommend continuing psychotherapy with Rinaldo Cloud, LCSW. Patient to follow-up with this provider in 2 months or sooner if clinically indicated. Patient advised to contact office with any questions, adverse effects, or acute worsening in signs and symptoms.  Chelsea Benson was seen today for follow-up.  Diagnoses and all orders for this visit:  Bipolar II disorder ([place]) -     ARIPiprazole (ABILIFY) 2 MG tablet; Take 2 tablets (4 mg total) by mouth daily.  Anxiety disorder, unspecified type -     busPIRone (BUSPAR) 30 MG tablet; Take 1 tablet (30 mg total) by mouth 2 (two) times daily.    Please see After Visit Summary for patient specific instructions.  Future Appointments  Date Time Provider Echo  08/24/2020  9:20 AM Leonie Man, MD [facility]  08/25/2020  9:00 AM Shanon Ace, LCSW [facility] None  09/08/2020  9:00 AM Shanon Ace, LCSW [facility] None  09/22/2020  9:00 AM Shanon Ace, LCSW [facility] None  10/14/2020 11:00 AM Thayer Headings, PMHNP [facility] None    No orders of the defined types were placed in this encounter.     -------------------------------

## 2020-08-18 ENCOUNTER — Other Ambulatory Visit: Payer: Self-pay

## 2020-08-18 DIAGNOSIS — F419 Anxiety disorder, unspecified: Secondary | ICD-10-CM

## 2020-08-18 MED ORDER — OXCARBAZEPINE 300 MG PO TABS: 600.0000 mg | ORAL_TABLET | Freq: Every day | ORAL | 0 refills | Status: DC

## 2020-08-20 ENCOUNTER — Telehealth: Payer: Self-pay

## 2020-08-20 NOTE — Telephone Encounter (Signed)
Morrie Sheldon from [facility] called stating patient is currently on Symbicort and is unable to afford it and would like to know if we could switch from Symbicort to Perforomist  and Budesonide via nebulizer and send it to [facility] specialty pharmacy for coverage. Also they are requiring an over night oxymetry study in order to qualify patient for coverage of medications.

## 2020-08-20 NOTE — Telephone Encounter (Signed)
Not understanding why she would need an overnight oximetry study to use nebulizer medications during the day.   Regardless if she can not afford the Symbicort she may be eligible for the drug assistance program with [employer].  I would recommend she complete the [employer] drug assistance application to see if she can get free drug.

## 2020-08-20 NOTE — Telephone Encounter (Signed)
Left a message for Chelsea Benson to call back explaining that we will try to get Symbicort approved through [employer] instead of sending alternative medication through [facility]. Will need to print forms and give to patient to fill out and sign.

## 2020-08-24 ENCOUNTER — Encounter: Payer: Self-pay | Admitting: Cardiology

## 2020-08-24 ENCOUNTER — Other Ambulatory Visit: Payer: Self-pay

## 2020-08-24 ENCOUNTER — Ambulatory Visit (INDEPENDENT_AMBULATORY_CARE_PROVIDER_SITE_OTHER): Payer: 59 | Admitting: Cardiology

## 2020-08-24 VITALS — BP 124/90 | HR 83 | Ht 64.0 in | Wt 180.0 lb

## 2020-08-24 DIAGNOSIS — R002 Palpitations: Secondary | ICD-10-CM | POA: Diagnosis not present

## 2020-08-24 DIAGNOSIS — Z7189 Other specified counseling: Secondary | ICD-10-CM

## 2020-08-24 DIAGNOSIS — R079 Chest pain, unspecified: Secondary | ICD-10-CM | POA: Diagnosis not present

## 2020-08-24 DIAGNOSIS — E785 Hyperlipidemia, unspecified: Secondary | ICD-10-CM | POA: Diagnosis not present

## 2020-08-24 DIAGNOSIS — I10 Essential (primary) hypertension: Secondary | ICD-10-CM | POA: Diagnosis not present

## 2020-08-24 MED ORDER — DILTIAZEM HCL ER COATED BEADS 240 MG PO CP24: 240.0000 mg | ORAL_CAPSULE | Freq: Every day | ORAL | 3 refills | Status: DC

## 2020-08-24 NOTE — Patient Instructions (Signed)
Medication Instructions:  FINISH USING DILTIAZEM 180 MG UNTIL BOTTLES IS EMPTY THEN  START TAKING DILTAZEM 240 MG ONE CAPSULE DAILY  NO OTHER CHANGES  *If you need a refill on your cardiac medications before your next appointment, please call your pharmacy*   Lab Work: NOT NEEDED If you have labs (blood work) drawn today and your tests are completely normal, you will receive your results only by: Marland Kitchen MyChart Message (if you have MyChart) OR . A paper copy in the mail If you have any lab test that is abnormal or we need to change your treatment, we will call you to review the results.   Testing/Procedures: NO CHANGES   Follow-Up: At [facility], you and your health needs are our priority.  As part of our continuing mission to provide you with exceptional heart care, we have created designated Provider Care Teams.  These Care Teams include your primary Cardiologist (physician) and Advanced Practice Providers (APPs -  Physician Assistants and Nurse Practitioners) who all work together to provide you with the care you need, when you need it.  We recommend signing up for the patient portal called "MyChart".  Sign up information is provided on this After Visit Summary.  MyChart is used to connect with patients for Virtual Visits (Telemedicine).  Patients are able to view lab/test results, encounter notes, upcoming appointments, etc.  Non-urgent messages can be sent to your provider as well.   To learn more about what you can do with MyChart, go to [email or web address].    Your next appointment:   6 month(s)  The format for your next appointment:   In Person  Provider:   Bryan Lemma, MD   Other Instructions  CONTINUE TO USE YOUR EMAY DEVICE TO RECORD HEARTRATE IF NEEDED

## 2020-08-24 NOTE — Progress Notes (Signed)
Primary Care Provider: Marda Stalker, PA-C Cardiologist: Glenetta Hew, MD Electrophysiologist: None  Clinic Note: Chief Complaint  Patient presents with  . Follow-up    Coroonary CTA resuls  . Chest Pain    Normal Cor CTA  . Palpitations    EMAY results     HPI:    Chelsea Benson is a 69 y.o. female with a PMH notable for hypertension who presents today for 44-monthfollow-up for evaluation of chest discomfort and tachycardia.  Chelsea TARTAGLIAwas last seen on June 17, 2020 for chest discomfort and one episode of tachycardia. --> We ordered a coronary CTA; she also started using her EMAY monitor on her phone ([place]  Recent Hospitalizations:   08/10/2020-ER visit for acute suppurative otitis media with tympanic membrane rupture.  Reviewed  CV studies:    The following studies were reviewed today: (if available, images/films reviewed: From Epic Chart or Care Everywhere) . Coronary Calcium Score-Coronary CTA (07/16/2020): Coronary calcium score 0.  Right dominant system.  Difficult to assess because of cardiac motion, but there is no obvious evidence of CAD.  Mild PA dilation.    Interval History:   Chelsea SHROPSHIREreturns here today overall doing pretty well.  She has not had any more chest pain and was happy to hear the results of her coronary CTA.  She is still feeling the fluttering sensations.  Her EMAY device has shown mostly sinus rhythm with PACs although there does appear to be some irregular rhythm and there.  I do see a lot of P waves.  Difficult to truly tell.  She is having some heart rates that are going up though.  About the one time she notices dizziness or chest discomfort is when she feels the palpitations and irregular beats.  She does get a little bit of exertional dyspnea but acknowledges that she is out of shape.  No real exertional chest pain.  Her blood pressures have been really strange admits she says the top number  usually is not that bad but the "lower number" (diastolic) is usually high.  CV Review of Symptoms (Summary) Cardiovascular ROS: positive for - irregular heartbeat, palpitations, rapid heart rate and exertional fatigue negative for - edema, orthopnea, paroxysmal nocturnal dyspnea, shortness of breath or syncope/near syncope, TIA/Amaurosis fugax.    The patient does not have symptoms concerning for COVID-19 infection (fever, chills, cough, or new shortness of breath).   REVIEWED OF SYSTEMS   Review of Systems  Constitutional: Positive for malaise/fatigue (off & on ). Negative for weight loss (stable).  HENT: Negative for nosebleeds.   Respiratory: Negative for shortness of breath.   Cardiovascular: Positive for palpitations.  Gastrointestinal: Positive for abdominal pain and heartburn (doing better).  Genitourinary: Positive for flank pain (Left flank).  Musculoskeletal: Positive for back pain (injections not working) and joint pain (knees ).  Neurological: Negative for dizziness, focal weakness and weakness.       Slow, unsteady / antalgic gait  Psychiatric/Behavioral: Negative for depression and memory loss. The patient is nervous/anxious (doing better).    I have reviewed and (if needed) personally updated the patient's problem list, medications, allergies, past medical and surgical history, social and family history.   PAST MEDICAL HISTORY   Past Medical History:  Diagnosis Date  . Allergic rhinoconjunctivitis   . Allergy history unknown   . Anxiety   . Arthritis    DDD, spondylosis  . Asthma   . Bipolar disorder ([place]   .  Chronic back pain    Has had several surgeries.  He uses a walker  . Chronic kidney disease    renal calculi  . Depression   . Diabetes mellitus without complication ([place])    dx in 2007  . Diabetes mellitus, type II ([place])   . Eczema   . Headache(784.0)    sinus related   . History of kidney stones   . Hx of blood clots    lungs  . Hx of pulmonary  embolus 2017  . Hyperlipidemia   . Hypertension   . Insomnia   . Knee pain   . Left ankle pain   . Memory change   . Mental disorder   . Mood disorder ([place])   . OSA on CPAP    PSG 05/24/2016: AHI 17/hour 02 mean 76%.  HSAT 06/13/17, AHI 11-hour 02 mean 79%  . Recurrent upper respiratory infection (URI)   . Sleep apnea   . Tremor of both hands     PAST SURGICAL HISTORY   Past Surgical History:  Procedure Laterality Date  . ABDOMINAL HYSTERECTOMY    . BACK SURGERY     2012- lumbar fusion  . BREAST SURGERY     L cyst removed - 1972  . BUNIONECTOMY Left    Great toe  . calf -R- cyst removed    . CARPAL TUNNEL RELEASE     both hands   . CORONARY CT ANGIOGRAM  07/17/2019    Coronary calcium score 0.  Right dominant system.  Difficult to assess because of cardiac motion, but there is no obvious evidence of CAD.  Mild PA dilation.   Marland Kitchen HAMMER TOE SURGERY Left    L foot  . NASAL SINUS SURGERY    . NM MYOVIEW LTD  12/2008   Normal study.  Normal LV function.  EF 61%.  Apical thinning but no ischemia or infarction.  Marland Kitchen SHOULDER ARTHROSCOPY Left    R shoulder- RCR  . TOE SURGERY    . TOTAL KNEE ARTHROPLASTY Right 05/22/2017   Procedure: TOTAL KNEE ARTHROPLASTY;  Surgeon: Frederik Pear, MD;  Location: [facility];  Service: Orthopedics;  Laterality: Right;  . TRANSTHORACIC ECHOCARDIOGRAM  06/2009    Technically difficult.  Moderate concentric LVH with normal LV function.  No regional wall motion normalities.  EF> 55%.  Normal right ventricle.  No valve lesions.  Normal filling pressures.  Marland Kitchen TRIGGER FINGER RELEASE     L thumb  . TUBAL LIGATION      Immunization History  Administered Date(s) Administered  . Influenza Split 09/04/2010  . Influenza, High Dose Seasonal PF 11/11/2011  . Influenza,trivalent, recombinat, inj, PF 12/07/2012, 10/04/2013  . PFIZER SARS-COV-2 Vaccination 01/28/2020, 02/19/2020    She has had both of her COVID-19 vaccine injections.-Pfizer (#1 February 23rd, #04 February 2015)   MEDICATIONS/ALLERGIES   Current Meds  Medication Sig  . ACCU-CHEK GUIDE test strip TEST BID PRN  . albuterol (PROVENTIL) (2.5 MG/3ML) 0.083% nebulizer solution USE 1 VIAL VIA NEBULIZER EVERY 4 HRS AS NEEDED FOR WHEEZING OR SHORTNESS OF BREATH. MANUFACTURER RECOMMENDS NOT EXCEEDING 4 VIALS PER DAY  . albuterol (VENTOLIN HFA) 108 (90 Base) MCG/ACT inhaler Inhale 2 puffs into the lungs every 4 (four) hours as needed.  . ARIPiprazole (ABILIFY) 2 MG tablet Take 2 tablets (4 mg total) by mouth daily.  Marland Kitchen aspirin EC 81 MG tablet Take 81 mg by mouth daily.  Marland Kitchen azelastine (ASTELIN) 0.1 % nasal spray Place 1 spray into  both nostrils 2 (two) times daily.  . Blood Glucose Monitoring Suppl (ACCU-CHEK GUIDE) w/Device KIT See admin instructions.  . budesonide-formoterol (SYMBICORT) 160-4.5 MCG/ACT inhaler Inhale 2 puffs into the lungs in the morning and at bedtime.  . busPIRone (BUSPAR) 30 MG tablet Take 1 tablet (30 mg total) by mouth 2 (two) times daily.  Marland Kitchen CLOBETASOL PROPIONATE E 0.05 % emollient cream Apply 1 application topically 2 (two) times daily as needed (rash).   . clonazePAM (KLONOPIN) 0.5 MG tablet Take 1/2-1 tab po TID prn anxiety. (Do not combine with Ambien)  . desonide (DESOWEN) 0.05 % ointment APPLY TO AFFECTED AREA(S)  TOPICALLY TWICE DAILY AS  NEEDED  . EPINEPHRINE 0.3 mg/0.3 mL IJ SOAJ injection INJECT CONTENTS OF 1 PEN AS NEEDED FOR ALLERGIC  RESPONSE AS DIRECTED BY MD. SEEK MEDICAL ATTENTION  AFTER USE.  . famotidine (PEPCID) 20 MG tablet TAKE 1 TABLET BY MOUTH  DAILY AFTER SUPPER.  . fluticasone (FLONASE) 50 MCG/ACT nasal spray USE 2 SPRAYS IN BOTH  NOSTRILS DAILY  . hydrochlorothiazide (HYDRODIURIL) 25 MG tablet Take 25 mg by mouth daily.   . hydrOXYzine (ATARAX/VISTARIL) 25 MG tablet TAKE 1 TABLET BY MOUTH  EVERY 6 HOURS AS NEEDED  . Mepolizumab (NUCALA) 100 MG/ML SOAJ Inject 100 mg into the skin every 28 (twenty-eight) days.  . metFORMIN (GLUCOPHAGE-XR) 500 MG 24 hr  tablet Take 500 mg by mouth 1 day or 1 dose.   . mirtazapine (REMERON) 30 MG tablet TAKE 1 TABLET BY MOUTH AT  BEDTIME  . montelukast (SINGULAIR) 10 MG tablet TAKE 1 TABLET BY MOUTH AT  BEDTIME  . Oxcarbazepine (TRILEPTAL) 300 MG tablet Take 2 tablets (600 mg total) by mouth at bedtime.  . pantoprazole (PROTONIX) 40 MG tablet TAKE 1 TABLET BY MOUTH  DAILY. TAKE 30 TO 60  MINUTES BEFORE FIRST MEAL  OF THE DAY  . potassium chloride SA (K-DUR) 20 MEQ tablet Take 20 mEq by mouth daily.  . pravastatin (PRAVACHOL) 20 MG tablet Take 20 mg by mouth at bedtime.   . pregabalin (LYRICA) 100 MG capsule Take 100 mg by mouth 3 (three) times daily.  Marland Kitchen PROAIR HFA 108 (90 Base) MCG/ACT inhaler Inhale 2 puffs into the lungs every 4 (four) hours as needed for wheezing or shortness of breath.  Marland Kitchen Respiratory Therapy Supplies (NEBULIZER) DEVI Use as directed with nebulizer solution.  Marland Kitchen Respiratory Therapy Supplies (NEBULIZER/TUBING/MOUTHPIECE) KIT Use as directed with nebulizer machine  . Semaglutide, 1 MG/DOSE, (OZEMPIC, 1 MG/DOSE,) 2 MG/1.5ML SOPN Take 1 mg by mouth once a week.  . Suvorexant (BELSOMRA) 20 MG TABS Take 20 mg by mouth at bedtime.  Marland Kitchen zolpidem (AMBIEN) 10 MG tablet Take 0.5-1 tablets (5-10 mg total) by mouth at bedtime as needed. for sleep  . [DISCONTINUED] azelastine (OPTIVAR) 0.05 % ophthalmic solution INSTILL 1 DROP INTO BOTH  EYES TWICE DAILY  . [DISCONTINUED] azithromycin (ZITHROMAX) 250 MG tablet Take 2 tablets by mouth once on day one. On days 2-5 take 1 tablet by mouth once daily.  . [DISCONTINUED] diltiazem (CARDIZEM CD) 180 MG 24 hr capsule Take 180 mg by mouth daily.   . [DISCONTINUED] levocetirizine (XYZAL) 5 MG tablet Take 1 tablet (5 mg total) by mouth in the morning and at bedtime.  . [DISCONTINUED] predniSONE (DELTASONE) 10 MG tablet Take 64m tonight, then take 270min the am and pm for 2 days, then take 20 mg in the am, then 1042mn the am, then stop.    Allergies  Allergen  Reactions  . Cymbalta [Duloxetine Hcl] Other (See Comments)    Caused hair loss  . Depo-Medrol [Methylprednisolone Acetate] Hives    05/29/20 developed hives "all over my back" after MBB; resolved with Benadryl and "a few days."  Can tolerate Dexamethasone/Decadron.  . Rosuvastatin Calcium Other (See Comments) and Cough    Rapid heart rate  . Almond Meal Itching and Swelling    Tongue swells and itches  . Almond Oil Itching and Swelling    Itching and swelling of tongue   . Carvedilol Cough  . Crestor [Rosuvastatin] Cough  . Fish Allergy Itching    Halibut fish  . Losartan Potassium Cough  . Morphine And Related Itching    SOCIAL HISTORY/FAMILY HISTORY   Reviewed in Epic:  Pertinent findings: no new changes  OBJCTIVE -PE, EKG, labs   Wt Readings from Last 3 Encounters:  08/24/20 180 lb (81.6 kg)  06/17/20 175 lb 12.8 oz (79.7 kg)  03/30/20 182 lb 3.2 oz (82.6 kg)    Physical Exam: BP 124/90   Pulse 83   Ht _0  (1.626 m)   Wt 180 lb (81.6 kg)   SpO2 96%   BMI 30.90 kg/m  Physical Exam Vitals reviewed.  Constitutional:      General: She is not in acute distress.    Appearance: Normal appearance. She is obese. She is not ill-appearing.  HENT:     Head: Normocephalic and atraumatic.  Neck:     Vascular: No carotid bruit, hepatojugular reflux or JVD.  Cardiovascular:     Rate and Rhythm: Normal rate and regular rhythm. Occasional extrasystoles are present.    Chest Wall: PMI is not displaced.     Pulses: Decreased pulses (Decreased pulses because of body habitus.  Difficult to palpate).     Heart sounds: S1 normal and S2 normal. Heart sounds are distant. Murmur (Soft 1/6 SEM at RUSB.) heard.  No friction rub.  Pulmonary:     Effort: Pulmonary effort is normal. No respiratory distress.     Breath sounds: Normal breath sounds.  Musculoskeletal:        General: No swelling. Normal range of motion.     Cervical back: Normal range of motion.  Neurological:      General: No focal deficit present.     Mental Status: She is alert and oriented to person, place, and time.  Psychiatric:        Mood and Affect: Mood normal.        Behavior: Behavior normal.        Thought Content: Thought content normal.        Judgment: Judgment normal.      Adult ECG Report  Reviewed her EMAY recordings.  Mostly noted PACs with regular rhythm. ->  Rates were varied anywhere from the 60s to 110 120.  Recent Labs: Apr 08, 2020-TC 140, TG 56, HDL 50, LDL 86.  A1c 7.3,  From 06/12/2020, Hgb 12.2.  No results found for: CHOL, HDL, LDLCALC, LDLDIRECT, TRIG, CHOLHDL Lab Results  Component Value Date   CREATININE 0.62 07/06/2020   BUN 13 07/06/2020   NA 139 07/06/2020   K 4.2 07/06/2020   CL 100 07/06/2020   CO2 26 07/06/2020   No results found for: TSH  ASSESSMENT/PLAN    Problem List Items Addressed This Visit    Hyperlipidemia with target LDL less than 100 (Chronic)    Recently checked in May.  LDL is 86.  Pretty well controlled.  Is on pravastatin 20 mg.      Relevant Medications   diltiazem (CARDIZEM CD) 240 MG 24 hr capsule   Essential hypertension (Chronic)    Her diastolic pressures are pretty high which is interesting.  She is on diltiazem which I will increase to 240 mg daily because of increased palpitations.  Hope this will help some.   She is also on HCTZ.  If pressures were to go up, both systolic and diastolic, I would consider adding additional medication.  She has had issues with ARB use in the past, so I am reluctant to do that.  I am also reluctant on adding beta-blockers for fear of too much rate control.  However, we could consider carvedilol for more blood pressure and heart rate.      Relevant Medications   diltiazem (CARDIZEM CD) 240 MG 24 hr capsule   Cardiac risk counseling   Chest pain at rest - Primary    No further episodes of chest pain.  Her coronary CTA was very reassuring.  No significant lesions noted.  Nothing to suggest  cardiac chest pain.  She may be feeling the palpitations      Rapid palpitations    I think that she may capture an episode with her EMAY unfortunately, the rhythms are very difficult to see.  If she has prolonged episodes, and may be easier to read.  I have been reluctant to use beta-blocker in the past, and therefore reduce diltiazem.  I will increase diltiazem 240 mg daily.         COVID-19 Education: The signs and symptoms of COVID-19 were discussed with the patient and how to seek care for testing (follow up with PCP or arrange E-visit).   The importance of social distancing and COVID-19 vaccination was discussed today. The patient is practicing social distancing & Masking.   I spent a total of 1mnutes with the patient spent in direct patient consultation.  Additional time spent with chart review  / charting (studies, outside notes, etc): 9 Total Time: 37 min   Current medicines are reviewed at length with the patient today.  (+/- concerns) n/a  Notice: This dictation was prepared with Dragon dictation along with smaller phrase technology. Any transcriptional errors that result from this process are unintentional and may not be corrected upon review.  Patient Instructions / Medication Changes & Studies & Tests Ordered   Patient Instructions  Medication Instructions:  FINISH USING DILTIAZEM 180 MG UNTIL BOTTLES IS EMPTY THEN  START TAKING DILTAZEM 240 MG ONE CAPSULE DAILY  NO OTHER CHANGES  *If you need a refill on your cardiac medications before your next appointment, please call your pharmacy*   Lab Work: NOT NEEDED If you have labs (blood work) drawn today and your tests are completely normal, you will receive your results only by: .Marland KitchenMyChart Message (if you have MyChart) OR . A paper copy in the mail If you have any lab test that is abnormal or we need to change your treatment, we will call you to review the results.   Testing/Procedures: NO  CHANGES   Follow-Up: At [facility] you and your health needs are our priority.  As part of our continuing mission to provide you with exceptional heart care, we have created designated Provider Care Teams.  These Care Teams include your primary Cardiologist (physician) and Advanced Practice Providers (APPs -  Physician Assistants and Nurse Practitioners) who all work together to provide you with the care you need,  when you need it.  We recommend signing up for the patient portal called "MyChart".  Sign up information is provided on this After Visit Summary.  MyChart is used to connect with patients for Virtual Visits (Telemedicine).  Patients are able to view lab/test results, encounter notes, upcoming appointments, etc.  Non-urgent messages can be sent to your provider as well.   To learn more about what you can do with MyChart, go to [email or web address].    Your next appointment:   6 month(s)  The format for your next appointment:   In Person  Provider:   Glenetta Hew, MD   Other Instructions  CONTINUE TO USE YOUR [place] TO RECORD HEARTRATE IF NEEDED    Studies Ordered:   No orders of the defined types were placed in this encounter.    Glenetta Hew, M.D., M.S. Interventional Cardiologist   Pager # [phone] Phone # [phone] [address] [place], [place] [address]   Thank you for choosing Heartcare at [facility]!!

## 2020-08-25 ENCOUNTER — Telehealth (INDEPENDENT_AMBULATORY_CARE_PROVIDER_SITE_OTHER): Payer: 59 | Admitting: Psychiatry

## 2020-08-25 DIAGNOSIS — F419 Anxiety disorder, unspecified: Secondary | ICD-10-CM
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
## 2020-08-26 NOTE — Telephone Encounter (Signed)
Called and spoke with the patient and advised about [place]. I advised that we can mail the forms to her home and then she can mail them back to [place] and we will get it taken care of for her. Patient verbalized understanding. Forms have been mailed to patient's home.

## 2020-08-26 NOTE — Telephone Encounter (Signed)
Left message for patient to call office to see if she wants to do the [place] program to cover her medication for Symbicort.

## 2020-08-28 ENCOUNTER — Other Ambulatory Visit: Payer: Self-pay

## 2020-08-28 ENCOUNTER — Encounter: Payer: Self-pay | Admitting: Cardiology

## 2020-08-28 ENCOUNTER — Telehealth: Payer: Self-pay | Admitting: Allergy

## 2020-08-28 MED ORDER — LEVOCETIRIZINE DIHYDROCHLORIDE 5 MG PO TABS
5.0000 mg | ORAL_TABLET | Freq: Two times a day (BID) | ORAL | 1 refills | Status: DC
Start: 2020-08-28 — End: 2020-09-09

## 2020-08-28 NOTE — Telephone Encounter (Signed)
Called patient to confirm pharmacy and I sent the prescription over.

## 2020-08-28 NOTE — Assessment & Plan Note (Signed)
I think that she may capture an episode with her EMAY unfortunately, the rhythms are very difficult to see.  If she has prolonged episodes, and may be easier to read.  I have been reluctant to use beta-blocker in the past, and therefore reduce diltiazem.  I will increase diltiazem 240 mg daily.

## 2020-08-28 NOTE — Telephone Encounter (Signed)
Patient said she called pharmacy about Xyzal and was told it was not in their formulary.

## 2020-08-28 NOTE — Assessment & Plan Note (Signed)
Recently checked in May.  LDL is 86.  Pretty well controlled.  Is on pravastatin 20 mg.

## 2020-08-28 NOTE — Assessment & Plan Note (Addendum)
Her diastolic pressures are pretty high which is interesting.  She is on diltiazem which I will increase to 240 mg daily because of increased palpitations.  Hope this will help some.   She is also on HCTZ.  If pressures were to go up, both systolic and diastolic, I would consider adding additional medication.  She has had issues with ARB use in the past, so I am reluctant to do that.  I am also reluctant on adding beta-blockers for fear of too much rate control.  However, we could consider carvedilol for more blood pressure and heart rate.

## 2020-08-28 NOTE — Assessment & Plan Note (Signed)
No further episodes of chest pain.  Her coronary CTA was very reassuring.  No significant lesions noted.  Nothing to suggest cardiac chest pain.  She may be feeling the palpitations

## 2020-08-29 ENCOUNTER — Other Ambulatory Visit: Payer: Self-pay

## 2020-08-29 ENCOUNTER — Ambulatory Visit
Admission: EM | Admit: 2020-08-29 | Discharge: 2020-08-29 | Disposition: A | Discharge status: Home / Self Care | Payer: 59 | Attending: Family Medicine | Admitting: Family Medicine

## 2020-08-29 ENCOUNTER — Encounter: Payer: Self-pay | Admitting: Emergency Medicine

## 2020-08-29 DIAGNOSIS — M549 Dorsalgia, unspecified: Secondary | ICD-10-CM

## 2020-08-29 DIAGNOSIS — G8929 Other chronic pain: Secondary | ICD-10-CM | POA: Diagnosis not present

## 2020-08-29 MED ORDER — TIZANIDINE HCL 4 MG PO TABS: 4.0000 mg | ORAL_TABLET | Freq: Four times a day (QID) | ORAL | 0 refills | Status: DC | PRN

## 2020-08-29 MED ORDER — KETOROLAC TROMETHAMINE 30 MG/ML IJ SOLN
60.0000 mg | Freq: Once | INTRAMUSCULAR | Status: AC
  Administered 2020-08-29: 60 mg via INTRAMUSCULAR

## 2020-08-29 MED ORDER — MELOXICAM 15 MG PO TABS: 15.0000 mg | ORAL_TABLET | Freq: Every day | ORAL | 1 refills | Status: DC

## 2020-08-29 NOTE — Discharge Instructions (Addendum)
Start meloxicam tomorrow.  You may take tizanidine which is a muscle relaxer today every 6 hours as needed for low back pain. You received a Toradol injection today which is for inflammation if pain does not resolve within the next 5 days I would recommend follow-up at [facility] orthopedic office for further evaluation of recurrent back pain.

## 2020-08-29 NOTE — ED Provider Notes (Signed)
[name]    CSN: [id number] Arrival date & time: 08/29/20  1216      History   Chief Complaint Chief Complaint  Patient presents with  . Back Pain    HPI Chelsea Benson is a 69 y.o. female.   HPI  Patient presents for recurrent low back pain. Current episode of back pain is localized to the left side. Onset of symptoms 5 days ago. She has not taken any medication for pain. Denies ant stool or urine incontinence. Denies any other acute issues.  Past Medical History:  Diagnosis Date  . Allergic rhinoconjunctivitis   . Allergy history unknown   . Anxiety   . Arthritis    DDD, spondylosis  . Asthma   . Bipolar disorder ([place])   . Chronic back pain    Has had several surgeries.  He uses a walker  . Chronic kidney disease    renal calculi  . Depression   . Diabetes mellitus without complication ([place])    dx in 2007  . Diabetes mellitus, type II ([place])   . Eczema   . Headache(784.0)    sinus related   . History of kidney stones   . Hx of blood clots    lungs  . Hx of pulmonary embolus 2017  . Hyperlipidemia   . Hypertension   . Insomnia   . Knee pain   . Left ankle pain   . Memory change   . Mental disorder   . Mood disorder ([place])   . OSA on CPAP    PSG 05/24/2016: AHI 17/hour 02 mean 76%.  HSAT 06/13/17, AHI 11-hour 02 mean 79%  . Recurrent upper respiratory infection (URI)   . Sleep apnea   . Tremor of both hands     Patient Active Problem List   Diagnosis Date Noted  . Chest pain at rest 06/17/2020  . Rapid palpitations 06/17/2020  . Acute pharyngitis 01/13/2020  . Cough variant asthma vs UACS 06/06/2019  . Moderate persistent asthma with acute exacerbation 03/13/2019  . Allergic rhinitis due to allergen 03/13/2019  . Allergic conjunctivitis of both eyes 03/13/2019  . 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  . Anxiety hyperventilation 10/03/2018  . Bipolar II disorder ([place]) 09/18/2018  . Cardiac risk counseling  01/07/2018  . Influenza 10/19/2017  . Cough 10/19/2017  . Allergy to almonds 10/19/2017  . Flexural atopic dermatitis 10/19/2017  . Allergic rhinoconjunctivitis 10/19/2017  . Moderate persistent asthma, uncomplicated 50/08/3817  . S/P arthroscopy of right knee 06/04/2017  . Acute blood loss as cause of postoperative anemia 06/04/2017  . Mood disorder ([place])   . Allergy history unknown   . Anxiety 05/24/2017  . Diabetes mellitus ([place]) 05/24/2017  . Hyperlipidemia with target LDL less than 100 05/24/2017  . Insomnia 05/24/2017  . Primary osteoarthritis of right knee 05/22/2017  . Osteoarthritis of right knee 05/18/2017  . Allergic rhinitis due to pollen 10/06/2014  . Arthritis 10/06/2014  . Asthma 10/06/2014  . Cubital tunnel syndrome, right 10/06/2014  . Headache 10/06/2014  . Essential hypertension 10/06/2014  . Depression 08/07/2014  . Postlaminectomy syndrome, lumbar region 09/06/2013  . DDD (degenerative disc disease), lumbosacral 05/11/2013  . Spinal stenosis of lumbar region 05/11/2013    Past Surgical History:  Procedure Laterality Date  . ABDOMINAL HYSTERECTOMY    . BACK SURGERY     2012- lumbar fusion  . BREAST SURGERY     L cyst removed - 1972  . BUNIONECTOMY  Left    Great toe  . calf -R- cyst removed    . CARPAL TUNNEL RELEASE     both hands   . CORONARY CT ANGIOGRAM  07/17/2019    Coronary calcium score 0.  Right dominant system.  Difficult to assess because of cardiac motion, but there is no obvious evidence of CAD.  Mild PA dilation.   Marland Kitchen HAMMER TOE SURGERY Left    L foot  . NASAL SINUS SURGERY    . NM MYOVIEW LTD  12/2008   Normal study.  Normal LV function.  EF 61%.  Apical thinning but no ischemia or infarction.  Marland Kitchen SHOULDER ARTHROSCOPY Left    R shoulder- RCR  . TOE SURGERY    . TOTAL KNEE ARTHROPLASTY Right 05/22/2017   Procedure: TOTAL KNEE ARTHROPLASTY;  Surgeon: Frederik Pear, MD;  Location: [facility];  Service: Orthopedics;  Laterality: Right;  .  TRANSTHORACIC ECHOCARDIOGRAM  06/2009    Technically difficult.  Moderate concentric LVH with normal LV function.  No regional wall motion normalities.  EF> 55%.  Normal right ventricle.  No valve lesions.  Normal filling pressures.  Marland Kitchen TRIGGER FINGER RELEASE     L thumb  . TUBAL LIGATION      OB History   No obstetric history on file.      Home Medications    Prior to Admission medications   Medication Sig Start Date End Date Taking? Authorizing Provider  ACCU-CHEK GUIDE test strip TEST BID PRN 09/17/18   [provider]  albuterol (PROVENTIL) (2.5 MG/3ML) 0.083% nebulizer solution USE 1 VIAL VIA NEBULIZER EVERY 4 HRS AS NEEDED FOR WHEEZING OR SHORTNESS OF BREATH. MANUFACTURER RECOMMENDS NOT EXCEEDING 4 VIALS PER DAY 05/27/20   Kennith Gain, MD  albuterol (VENTOLIN HFA) 108 (90 Base) MCG/ACT inhaler Inhale 2 puffs into the lungs every 4 (four) hours as needed. 03/12/20   Kennith Gain, MD  ARIPiprazole (ABILIFY) 2 MG tablet Take 2 tablets (4 mg total) by mouth daily. 08/14/20 11/12/20  Thayer Headings, PMHNP  aspirin EC 81 MG tablet Take 81 mg by mouth daily.    [provider]  azelastine (ASTELIN) 0.1 % nasal spray Place 1 spray into both nostrils 2 (two) times daily. 06/11/20   Kennith Gain, MD  Blood Glucose Monitoring Suppl (ACCU-CHEK GUIDE) w/Device KIT See admin instructions. 09/18/18   [provider]  budesonide-formoterol (SYMBICORT) 160-4.5 MCG/ACT inhaler Inhale 2 puffs into the lungs in the morning and at bedtime. 06/11/20   Kennith Gain, MD  busPIRone (BUSPAR) 30 MG tablet Take 1 tablet (30 mg total) by mouth 2 (two) times daily. 08/14/20 11/12/20  Thayer Headings, PMHNP  CLOBETASOL PROPIONATE E 0.05 % emollient cream Apply 1 application topically 2 (two) times daily as needed (rash).  01/22/19   [provider]  clonazePAM (KLONOPIN) 0.5 MG tablet Take 1/2-1 tab po TID prn anxiety. (Do not combine  with Ambien) 07/29/20   Thayer Headings, PMHNP  desonide (DESOWEN) 0.05 % ointment APPLY TO AFFECTED AREA(S)  TOPICALLY TWICE DAILY AS  NEEDED 05/27/20   Kennith Gain, MD  diltiazem (CARDIZEM CD) 240 MG 24 hr capsule Take 1 capsule (240 mg total) by mouth daily. 08/24/20 11/22/20  Leonie Man, MD  EPINEPHRINE 0.3 mg/0.3 mL IJ SOAJ injection INJECT CONTENTS OF 1 PEN AS NEEDED FOR ALLERGIC  RESPONSE AS DIRECTED BY MD. [place]  AFTER USE. 05/27/20   Kennith Gain, MD  famotidine (PEPCID) 20  MG tablet TAKE 1 TABLET BY MOUTH  DAILY AFTER SUPPER. 01/06/20   Tanda Rockers, MD  fluticasone [facility]) 50 MCG/ACT nasal spray USE 2 SPRAYS IN BOTH&nbsp;&nbsp;NOSTRILS DAILY 06/11/20   Padgett, Rae Halsted, MD  hydrochlorothiazide (HYDRODIURIL) 25 MG tablet Take 25 mg by mouth daily.  05/06/14   [provider]  hydrOXYzine (ATARAX/VISTARIL) 25 MG tablet TAKE 1 TABLET BY MOUTH  EVERY 6 HOURS AS NEEDED 07/27/20   Thayer Headings, PMHNP  levocetirizine (XYZAL) 5 MG tablet Take 1 tablet (5 mg total) by mouth in the morning and at bedtime. 08/28/20   Kennith Gain, MD  Mepolizumab (NUCALA) 100 MG/ML SOAJ Inject 100 mg into the skin every 28 (twenty-eight) days. 02/05/20   Kennith Gain, MD  metFORMIN (GLUCOPHAGE-XR) 500 MG 24 hr tablet Take 500 mg by mouth 1 day or 1 dose.  07/02/19   [provider]  metoprolol tartrate (LOPRESSOR) 50 MG tablet Take 2 tablets (100 mg total) by mouth once for 1 dose. TAKE TWO HOURS PRIOR TO  SCHEDULE CARDIAC TEST 06/17/20 06/17/20  Leonie Man, MD  mirtazapine (REMERON) 30 MG tablet TAKE 1 TABLET BY MOUTH AT  BEDTIME 01/16/20   Thayer Headings, PMHNP  montelukast (SINGULAIR) 10 MG tablet TAKE 1 TABLET BY MOUTH AT  BEDTIME 05/27/20   Kennith Gain, MD  Oxcarbazepine (TRILEPTAL) 300 MG tablet Take 2 tablets (600 mg total) by mouth at bedtime. 08/18/20 02/14/21  Thayer Headings, PMHNP  pantoprazole  (PROTONIX) 40 MG tablet TAKE 1 TABLET BY MOUTH  DAILY. TAKE 30 TO 60  MINUTES BEFORE FIRST MEAL  OF THE DAY 10/25/19   Tanda Rockers, MD  potassium chloride SA (K-DUR) 20 MEQ tablet Take 20 mEq by mouth daily.    [provider]  pravastatin (PRAVACHOL) 20 MG tablet Take 20 mg by mouth at bedtime.  02/03/17   [provider]  pregabalin (LYRICA) 100 MG capsule Take 100 mg by mouth 3 (three) times daily.    [provider]  PROAIR HFA 108 [phone] Base) MCG/ACT inhaler Inhale 2 puffs into the lungs every 4 (four) hours as needed for wheezing or shortness of breath. 03/16/20   Kennith Gain, MD  Respiratory Therapy Supplies (NEBULIZER) DEVI Use as directed with nebulizer solution. 06/01/18   Valentina Shaggy, MD  Respiratory Therapy Supplies (NEBULIZER/TUBING/MOUTHPIECE) KIT Use as directed with nebulizer machine 04/17/20   Padgett, Rae Halsted, MD  Semaglutide, 1 MG/DOSE, (OZEMPIC, 1 MG/DOSE,) 2 MG/1.5ML SOPN Take 1 mg by mouth once a week.    [provider]  Suvorexant (BELSOMRA) 20 MG TABS Take 20 mg by mouth at bedtime. 07/14/20   Thayer Headings, PMHNP  zolpidem (AMBIEN) 10 MG tablet Take 0.5-1 tablets (5-10 mg total) by mouth at bedtime as needed. for sleep 08/14/20   Thayer Headings, PMHNP    Family History Family History  Problem Relation Age of Onset  . Diabetes Sister   . Diabetes Brother   . Hypertension Brother   . Cancer Maternal Grandmother   . Heart failure Maternal Grandmother   . Hypertension Paternal Grandmother   . Asthma Daughter   . Cancer Daughter   . Depression Daughter   . Hypertension Daughter   . Heart failure Maternal Aunt   . Pneumonia Mother   . Diabetes Mother   . Alcoholism Father   . Alcohol abuse Father   . Depression Daughter   . Schizophrenia Grandchild   . Allergies Neg Hx   .  Eczema Neg Hx   . Immunodeficiency Neg Hx     Social History Social History   Tobacco Use  . Smoking status: Passive  Smoke Exposure - Never Smoker  . Smokeless tobacco: Never Used  . Tobacco comment: grandson smokes, but in his car  Vaping Use  . Vaping Use: Never used  Substance Use Topics  . Alcohol use: Yes    Comment: rare use  . Drug use: No     Allergies   Cymbalta [duloxetine hcl], Depo-medrol [methylprednisolone acetate], Rosuvastatin calcium, Almond meal, Almond oil, Carvedilol, Crestor [rosuvastatin], Fish allergy, Losartan potassium, and Morphine and related   Review of Systems Review of Systems Pertinent negatives listed in HPI  Physical Exam Triage Vital Signs ED Triage Vitals [08/29/20 1520]  Enc Vitals Group     BP (!) 193/104     Pulse Rate 91     Resp 18     Temp 98.3 F (36.8 C)     Temp Source Oral     SpO2 96 %     Weight      Height      Head Circumference      Peak Flow      Pain Score 7     Pain Loc      Pain Edu?      Excl. in [place]?    No data found.  Updated Vital Signs BP (!) 193/104 (BP Location: Left Arm)   Pulse 91   Temp 98.3 F (36.8 C) (Oral)   Resp 18   SpO2 96%   Visual Acuity Right Eye Distance:   Left Eye Distance:   Bilateral Distance:    Right Eye Near:   Left Eye Near:    Bilateral Near:     Physical Exam General appearance: alert, well developed, well nourished, cooperative and in no distress Head: Normocephalic, without obvious abnormality, atraumatic Respiratory: Respirations even and unlabored, normal respiratory rate Heart: rate and rhythm normal. No gallop or murmurs noted on exam  Lumbar spine: No gross deformities, bony tenderness present, no curvature in spine,  Skin: Skin color, texture, turgor normal. No rashes seen  Psych: Appropriate mood and affect.  UC Treatments / Results  Labs (all labs ordered are listed, but only abnormal results are displayed) Labs Reviewed - No data to display  EKG   Radiology No results found.  Procedures Procedures (including critical care time)  Medications Ordered in  UC Medications - No data to display  Initial Impression / Assessment and Plan / UC Course  I have reviewed the triage vital signs and the nursing notes.  Pertinent labs & imaging results that were available during my care of the patient were reviewed by me and considered in my medical decision making (see chart for details).    Acute back pain, Toradol given today. Start Meloxicam tomorrow and cyclobenzaprine for acute pain. Recommend follow-up with orthopedic and or PCP for further management.  Final Clinical Impressions(s) / UC Diagnoses   Final diagnoses:  Other chronic back pain     Discharge Instructions     Start meloxicam tomorrow.  You may take tizanidine which is a muscle relaxer today every 6 hours as needed for low back pain. You received a Toradol injection today which is for inflammation if pain does not resolve within the next 5 days I would recommend follow-up at [facility] orthopedic office for further evaluation of recurrent back pain.    ED Prescriptions    Medication Sig Dispense  Auth. Provider   tiZANidine (ZANAFLEX) 4 MG tablet Take 1 tablet (4 mg total) by mouth every 6 (six) hours as needed for muscle spasms. 30 tablet Scot Jun, FNP   meloxicam (MOBIC) 15 MG tablet Take 1 tablet (15 mg total) by mouth daily. 30 tablet Scot Jun, FNP     PDMP not reviewed this encounter.   Scot Jun, FNP 09/02/20 [phone]

## 2020-08-29 NOTE — ED Triage Notes (Signed)
Pt here for left side and lower back pain x 5 days since straining while turning

## 2020-09-08 ENCOUNTER — Ambulatory Visit (INDEPENDENT_AMBULATORY_CARE_PROVIDER_SITE_OTHER): Payer: 59 | Admitting: Psychiatry

## 2020-09-08 DIAGNOSIS — F419 Anxiety disorder, unspecified: Secondary | ICD-10-CM
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
## 2020-09-09 ENCOUNTER — Other Ambulatory Visit: Payer: Self-pay

## 2020-09-09 MED ORDER — LEVOCETIRIZINE DIHYDROCHLORIDE 5 MG PO TABS: 5.0000 mg | ORAL_TABLET | Freq: Every day | ORAL | 5 refills | Status: DC | PRN

## 2020-09-09 NOTE — Telephone Encounter (Signed)
Spoke with [facility] was denied by insurance they will only approved for 1 tablet daily per 30 days. I sent that in and infromed pt she stated her understanding and also informed her that if she needed something for break thru she can go otc for xyzal to do it twice daily prn

## 2020-09-09 NOTE — Telephone Encounter (Signed)
Patient is still having trouble with filling her Xyzal through the [employer]. Patient states they told her insurance will not cover 180 pills. Patient needs medication as soon as possible, because she is getting sick.  Please advise.

## 2020-09-22 ENCOUNTER — Ambulatory Visit (INDEPENDENT_AMBULATORY_CARE_PROVIDER_SITE_OTHER): Payer: 59 | Admitting: Psychiatry

## 2020-09-22 DIAGNOSIS — F419 Anxiety disorder, unspecified: Secondary | ICD-10-CM
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
## 2020-10-14 ENCOUNTER — Telehealth (INDEPENDENT_AMBULATORY_CARE_PROVIDER_SITE_OTHER): Payer: 59 | Admitting: Psychiatry

## 2020-10-14 ENCOUNTER — Encounter: Payer: Self-pay | Admitting: Psychiatry

## 2020-10-14 DIAGNOSIS — F5101 Primary insomnia: Secondary | ICD-10-CM

## 2020-10-14 DIAGNOSIS — F419 Anxiety disorder, unspecified: Secondary | ICD-10-CM | POA: Diagnosis not present

## 2020-10-14 DIAGNOSIS — F3181 Bipolar II disorder: Secondary | ICD-10-CM | POA: Diagnosis not present

## 2020-10-14 DIAGNOSIS — F410 Panic disorder [episodic paroxysmal anxiety] without agoraphobia: Secondary | ICD-10-CM | POA: Diagnosis not present

## 2020-10-14 MED ORDER — OXCARBAZEPINE 300 MG PO TABS: 600.0000 mg | ORAL_TABLET | Freq: Every day | ORAL | 0 refills | Status: DC

## 2020-10-14 MED ORDER — HYDROXYZINE HCL 25 MG PO TABS: 25.0000 mg | ORAL_TABLET | Freq: Four times a day (QID) | ORAL | 0 refills | Status: DC | PRN

## 2020-10-14 MED ORDER — ZOLPIDEM TARTRATE 10 MG PO TABS: 5.0000 mg | ORAL_TABLET | Freq: Every evening | ORAL | 0 refills | Status: DC | PRN

## 2020-10-14 MED ORDER — CLONAZEPAM 0.5 MG PO TABS: ORAL_TABLET | ORAL | 0 refills | Status: DC

## 2020-10-14 MED ORDER — MIRTAZAPINE 30 MG PO TABS: ORAL_TABLET | ORAL | 3 refills | Status: DC

## 2020-10-14 MED ORDER — BELSOMRA 20 MG PO TABS: 20.0000 mg | ORAL_TABLET | Freq: Every day | ORAL | 0 refills | Status: DC

## 2020-10-14 MED ORDER — ARIPIPRAZOLE 2 MG PO TABS: 4.0000 mg | ORAL_TABLET | Freq: Every day | ORAL | 0 refills | Status: DC

## 2020-10-14 MED ORDER — BUSPIRONE HCL 30 MG PO TABS: 30.0000 mg | ORAL_TABLET | Freq: Two times a day (BID) | ORAL | 0 refills | Status: DC

## 2020-10-14 NOTE — Progress Notes (Signed)
Chelsea Benson [id number] [birth year] 69 y.o.  Virtual Visit via Telephone Note  I connected with pt on 10/14/20 at 11:00 AM EST by telephone and verified that I am speaking with the correct person using two identifiers.   I discussed the limitations, risks, security and privacy concerns of performing an evaluation and management service by telephone and the availability of in person appointments. I also discussed with the patient that there may be a patient responsible charge related to this service. The patient expressed understanding and agreed to proceed.   I discussed the assessment and treatment plan with the patient. The patient was provided an opportunity to ask questions and all were answered. The patient agreed with the plan and demonstrated an understanding of the instructions.   The patient was advised to call back or seek an in-person evaluation if the symptoms worsen or if the condition fails to improve as anticipated.  I provided 25 minutes of non-face-to-face time during this encounter.  The patient was located at home.  The provider was located at [place].   Thayer Headings, PMHNP   Subjective:   Patient ID:  Chelsea Benson is a 69 y.o. (DOB [birth year]) female.  Chief Complaint: No chief complaint on file.   HPI Chelsea Benson presents for follow-up of anxiety, mood disturbance, and insomnia. She reports that her 68 yo grandson is "going blind" and started having headaches. Her daughter is taking him to the doctor now. She reports that she is worried about him. She reports that her anxiety has been well controlled. Sleeping well. She reports she has had "a little bit, but not much" in terms of depression. Denies irritability. She reports that she is always "hyper" and tends to stay busy. She reports that she likes to keep things clean and tidy and will feel compelled to straighten things. Appetite has been stable. Concentration has been adequate. Denies  SI.   Has been enjoying time with her family. She reports that she has been having difficulty with one class. She reports that she is doing ok in her other classes.   She reports that she has been taking Ambien 5 mg po QHS with Belsomra. She reports that she tried not taking Ambien and taking Belsomra only and was unable to sleep.   She has been trying to get out of the house a little more.  Past Psychiatric Medication Trials: Cymbalta-hair loss Mirtazapine Prozac Trazodone-ineffective Ambien BuSpar Hydroxyzine Abilify Trileptal Xanax  Review of Systems:  Review of Systems  Cardiovascular: Negative for palpitations.  Musculoskeletal: Positive for arthralgias and neck pain. Negative for gait problem.  Neurological: Negative for tremors.  Psychiatric/Behavioral:       Please refer to HPI    Medications: I have reviewed the patient's current medications.  Current Outpatient Medications  Medication Sig Dispense Refill   diltiazem (CARDIZEM CD) 240 MG 24 hr capsule Take 1 capsule (240 mg total) by mouth daily. 90 capsule 3   ACCU-CHEK GUIDE test strip TEST BID PRN  1   albuterol (PROVENTIL) (2.5 MG/3ML) 0.083% nebulizer solution USE 1 VIAL VIA NEBULIZER EVERY 4 HRS AS NEEDED FOR WHEEZING OR SHORTNESS OF BREATH. MANUFACTURER RECOMMENDS NOT EXCEEDING 4 VIALS PER DAY 270 mL 1   albuterol (VENTOLIN HFA) 108 (90 Base) MCG/ACT inhaler Inhale 2 puffs into the lungs every 4 (four) hours as needed. 54 g 1   ARIPiprazole (ABILIFY) 2 MG tablet Take 2 tablets (4 mg total) by mouth daily. 180 tablet 0  aspirin EC 81 MG tablet Take 81 mg by mouth daily.     azelastine (ASTELIN) 0.1 % nasal spray Place 1 spray into both nostrils 2 (two) times daily. 90 mL 1   Blood Glucose Monitoring Suppl (ACCU-CHEK GUIDE) w/Device KIT See admin instructions.  1   budesonide-formoterol (SYMBICORT) 160-4.5 MCG/ACT inhaler Inhale 2 puffs into the lungs in the morning and at bedtime. 30.6 g 1    busPIRone (BUSPAR) 30 MG tablet Take 1 tablet (30 mg total) by mouth 2 (two) times daily. 180 tablet 0   CLOBETASOL PROPIONATE E 0.05 % emollient cream Apply 1 application topically 2 (two) times daily as needed (rash).      clonazePAM (KLONOPIN) 0.5 MG tablet Take 1/2-1 tab po TID prn anxiety. (Do not combine with Ambien) 90 tablet 0   desonide (DESOWEN) 0.05 % ointment APPLY TO AFFECTED AREA(S)  TOPICALLY TWICE DAILY AS  NEEDED 180 g 1   EPINEPHRINE 0.3 mg/0.3 mL IJ SOAJ injection INJECT CONTENTS OF 1 PEN AS NEEDED FOR ALLERGIC  RESPONSE AS DIRECTED BY MD. SEEK MEDICAL ATTENTION  AFTER USE. 4 each 1   famotidine (PEPCID) 20 MG tablet TAKE 1 TABLET BY MOUTH  DAILY AFTER SUPPER. 90 tablet 3   fluticasone (FLONASE) 50 MCG/ACT nasal spray USE 2 SPRAYS IN BOTH  NOSTRILS DAILY 48 g 1   hydrochlorothiazide (HYDRODIURIL) 25 MG tablet Take 25 mg by mouth daily.      hydrOXYzine (ATARAX/VISTARIL) 25 MG tablet Take 1 tablet (25 mg total) by mouth every 6 (six) hours as needed. 180 tablet 0   levocetirizine (XYZAL) 5 MG tablet Take 1 tablet (5 mg total) by mouth daily as needed for allergies. 30 tablet 5   meloxicam (MOBIC) 15 MG tablet Take 1 tablet (15 mg total) by mouth daily. 30 tablet 1   Mepolizumab (NUCALA) 100 MG/ML SOAJ Inject 100 mg into the skin every 28 (twenty-eight) days. 0.84 mL 11   metFORMIN (GLUCOPHAGE-XR) 500 MG 24 hr tablet Take 500 mg by mouth 1 day or 1 dose.      metoprolol tartrate (LOPRESSOR) 50 MG tablet Take 2 tablets (100 mg total) by mouth once for 1 dose. TAKE TWO HOURS PRIOR TO  SCHEDULE CARDIAC TEST 2 tablet 0   mirtazapine (REMERON) 30 MG tablet TAKE 1 TABLET BY MOUTH AT  BEDTIME 90 tablet 3   montelukast (SINGULAIR) 10 MG tablet TAKE 1 TABLET BY MOUTH AT  BEDTIME 90 tablet 3   Oxcarbazepine (TRILEPTAL) 300 MG tablet Take 2 tablets (600 mg total) by mouth at bedtime. 180 tablet 0   pantoprazole (PROTONIX) 40 MG tablet TAKE 1 TABLET BY MOUTH  DAILY. TAKE 30 TO  60  MINUTES BEFORE FIRST MEAL  OF THE DAY 90 tablet 3   potassium chloride SA (K-DUR) 20 MEQ tablet Take 20 mEq by mouth daily.     pravastatin (PRAVACHOL) 20 MG tablet Take 20 mg by mouth at bedtime.      pregabalin (LYRICA) 100 MG capsule Take 100 mg by mouth 3 (three) times daily.     PROAIR HFA 108 (90 Base) MCG/ACT inhaler Inhale 2 puffs into the lungs every 4 (four) hours as needed for wheezing or shortness of breath. 56 g 1   Respiratory Therapy Supplies (NEBULIZER) DEVI Use as directed with nebulizer solution. 1 each 1   Respiratory Therapy Supplies (NEBULIZER/TUBING/MOUTHPIECE) KIT Use as directed with nebulizer machine 1 kit 12   Semaglutide, 1 MG/DOSE, (OZEMPIC, 1 MG/DOSE,) 2 MG/1.5ML SOPN Take  1 mg by mouth once a week.     Suvorexant (BELSOMRA) 20 MG TABS Take 20 mg by mouth at bedtime. 90 tablet 0   tiZANidine (ZANAFLEX) 4 MG tablet Take 1 tablet (4 mg total) by mouth every 6 (six) hours as needed for muscle spasms. 30 tablet 0   zolpidem (AMBIEN) 10 MG tablet Take 0.5-1 tablets (5-10 mg total) by mouth at bedtime as needed. for sleep 90 tablet 0   No current facility-administered medications for this visit.    Medication Side Effects: None  Denies any involuntary movements  Allergies:  Allergies  Allergen Reactions   Cymbalta [Duloxetine Hcl] Other (See Comments)    Caused hair loss   Depo-Medrol [Methylprednisolone Acetate] Hives    05/29/20 developed hives "all over my back" after MBB; resolved with Benadryl and "a few days."  Can tolerate Dexamethasone/Decadron.   Rosuvastatin Calcium Other (See Comments) and Cough    Rapid heart rate   Almond Meal Itching and Swelling    Tongue swells and itches   Almond Oil Itching and Swelling    Itching and swelling of tongue    Carvedilol Cough   Crestor [Rosuvastatin] Cough   Fish Allergy Itching    Halibut fish   Losartan Potassium Cough   Morphine And Related Itching    Past Medical History:   Diagnosis Date   Allergic rhinoconjunctivitis    Allergy history unknown    Anxiety    Arthritis    DDD, spondylosis   Asthma    Bipolar disorder (HCC)    Chronic back pain    Has had several surgeries.  He uses a walker   Chronic kidney disease    renal calculi   Depression    Diabetes mellitus without complication ([place])    dx in 2007   Diabetes mellitus, type II ([place])    Eczema    Headache(784.0)    sinus related    History of kidney stones    Hx of blood clots    lungs   Hx of pulmonary embolus 2017   Hyperlipidemia    Hypertension    Insomnia    Knee pain    Left ankle pain    Memory change    Mental disorder    Mood disorder ([place])    OSA on CPAP    PSG 05/24/2016: AHI 17/hour 02 mean 76%.  HSAT 06/13/17, AHI 11-hour 02 mean 79%   Recurrent upper respiratory infection (URI)    Sleep apnea    Tremor of both hands     Family History  Problem Relation Age of Onset   Diabetes Sister    Diabetes Brother    Hypertension Brother    Cancer Maternal Grandmother    Heart failure Maternal Grandmother    Hypertension Paternal Grandmother    Asthma Daughter    Cancer Daughter    Depression Daughter    Hypertension Daughter    Heart failure Maternal Aunt    Pneumonia Mother    Diabetes Mother    Alcoholism Father    Alcohol abuse Father    Depression Daughter    Schizophrenia Grandchild    Allergies Neg Hx    Eczema Neg Hx    Immunodeficiency Neg Hx     Social History   Socioeconomic History   Marital status: Single    Spouse name: Not on file   Number of children: 2   Years of education: Not on file   Highest education level: Some  college, no degree  Occupational History    Comment: NA  Tobacco Use   Smoking status: Passive Smoke Exposure - Never Smoker   Smokeless tobacco: Never Used   Tobacco comment: grandson smokes, but in his car  Vaping Use   Vaping Use: Never used  Substance and Sexual  Activity   Alcohol use: Yes    Comment: rare use   Drug use: No   Sexual activity: Not Currently  Other Topics Concern   Not on file  Social History Narrative   She is currently single.  Has 2 girls.  Has some college education.  Currently disabled due to chronic back pain and not employed.   07/01/19 lives with brother Shanon Brow   Caffeine, none   Social Determinants of Health   Financial Resource Strain:    Difficulty of Paying Living Expenses: Not on file  Food Insecurity:    Worried About Charity fundraiser in the Last Year: Not on file   [employer] of Food in the Last Year: Not on file  Transportation Needs:    Lack of Transportation (Medical): Not on file   Lack of Transportation (Non-Medical): Not on file  Physical Activity:    Days of Exercise per Week: Not on file   Minutes of Exercise per Session: Not on file  Stress:    Feeling of Stress : Not on file  Social Connections:    Frequency of Communication with Friends and Family: Not on file   Frequency of Social Gatherings with Friends and Family: Not on file   Attends Religious Services: Not on file   Active Member of Clubs or Organizations: Not on file   Attends Archivist Meetings: Not on file   Marital Status: Not on file  Intimate Partner Violence:    Fear of Current or Ex-Partner: Not on file   Emotionally Abused: Not on file   Physically Abused: Not on file   Sexually Abused: Not on file    Past Medical History, Surgical history, Social history, and Family history were reviewed and updated as appropriate.   Please see review of systems for further details on the patient's review from today.   Objective:   Physical Exam:  BP 130/90    Pulse 88   Physical Exam Neurological:     Mental Status: She is alert and oriented to person, place, and time.     Cranial Nerves: No dysarthria.  Psychiatric:        Attention and Perception: Attention and perception normal.        Mood and  Affect: Mood normal.        Speech: Speech normal.        Behavior: Behavior is cooperative.        Thought Content: Thought content normal. Thought content is not paranoid or delusional. Thought content does not include homicidal or suicidal ideation. Thought content does not include homicidal or suicidal plan.        Cognition and Memory: Cognition and memory normal.        Judgment: Judgment normal.     Comments: Insight intact Some anxiety in response to grandson's sudden loss of vision.     Lab Review:     Component Value Date/Time   NA 139 07/06/2020 0834   K 4.2 07/06/2020 0834   CL 100 07/06/2020 0834   CO2 26 07/06/2020 0834   GLUCOSE 277 (H) 07/06/2020 0834   GLUCOSE 120 (H) 08/14/2019 5974  BUN 13 07/06/2020 0834   CREATININE 0.62 07/06/2020 0834   CALCIUM 10.2 07/06/2020 0834   PROT 7.3 04/28/2019 1712   ALBUMIN 4.2 04/28/2019 1712   AST 21 04/28/2019 1712   ALT 30 04/28/2019 1712   ALKPHOS 68 04/28/2019 1712   BILITOT 0.5 04/28/2019 1712   GFRNONAA 94 07/06/2020 0834   GFRAA 108 07/06/2020 0834       Component Value Date/Time   WBC 4.8 08/14/2019 0859   RBC 4.86 08/14/2019 0859   HGB 12.8 08/14/2019 0859   HGB 12.5 11/08/2018 1041   HCT 41.9 08/14/2019 0859   HCT 39.5 11/08/2018 1041   PLT 289 08/14/2019 0859   MCV 86.2 08/14/2019 0859   MCV 81 11/08/2018 1041   MCH 26.3 08/14/2019 0859   MCHC 30.5 08/14/2019 0859   RDW 14.9 08/14/2019 0859   RDW 14.3 11/08/2018 1041   LYMPHSABS 2.1 04/28/2019 1712   LYMPHSABS 1.4 11/08/2018 1041   MONOABS 0.5 04/28/2019 1712   EOSABS 0.3 04/28/2019 1712   EOSABS 0.2 11/08/2018 1041   BASOSABS 0.0 04/28/2019 1712   BASOSABS 0.0 11/08/2018 1041    No results found for: POCLITH, LITHIUM   No results found for: PHENYTOIN, PHENOBARB, VALPROATE, CBMZ   .res Assessment: Plan:   Will continue current plan of care since target signs and symptoms are well controlled without any tolerability issues. Continue Abilify  4 mg po qd for mood s/s. Continue Buspar 30 mg po BID for anxiety. Continue Klonopin prn anxiety.  Continue Hydroxyzine prn anxiety. Continue Remeron 30 mg po QHS for mood and insomnia.  Continue Trileptal 600 mg po QHS for mood, anxiety, and insomnia.  Continue Ambien 5 mg po QHS and Belsomra 20 mg po QHS for insomnia since pt has not been able to sleep when she has only taken Belsomra.  Recommend continuing therapy with Rinaldo Cloud, LCSW.  Pt to follow-up in 3 months or sooner if clinically indicated.  Patient advised to contact office with any questions, adverse effects, or acute worsening in signs and symptoms.  Diagnoses and all orders for this visit:  Bipolar II disorder ([place]) -     ARIPiprazole (ABILIFY) 2 MG tablet; Take 2 tablets (4 mg total) by mouth daily. -     mirtazapine (REMERON) 30 MG tablet; TAKE 1 TABLET BY MOUTH AT  BEDTIME  Anxiety disorder, unspecified type -     busPIRone (BUSPAR) 30 MG tablet; Take 1 tablet (30 mg total) by mouth 2 (two) times daily. -     hydrOXYzine (ATARAX/VISTARIL) 25 MG tablet; Take 1 tablet (25 mg total) by mouth every 6 (six) hours as needed. -     Oxcarbazepine (TRILEPTAL) 300 MG tablet; Take 2 tablets (600 mg total) by mouth at bedtime.  Panic disorder -     clonazePAM (KLONOPIN) 0.5 MG tablet; Take 1/2-1 tab po TID prn anxiety. (Do not combine with Ambien)  Primary insomnia -     mirtazapine (REMERON) 30 MG tablet; TAKE 1 TABLET BY MOUTH AT  BEDTIME -     Suvorexant (BELSOMRA) 20 MG TABS; Take 20 mg by mouth at bedtime. -     zolpidem (AMBIEN) 10 MG tablet; Take 0.5-1 tablets (5-10 mg total) by mouth at bedtime as needed. for sleep    Please see After Visit Summary for patient specific instructions.  Future Appointments  Date Time Provider [place]  10/21/2020  9:00 AM Shanon Ace, LCSW [facility] None  10/27/2020  9:00 AM Shanon Ace, LCSW  [facility] None  11/10/2020  9:00 AM Shanon Ace, LCSW [facility] None  11/24/2020  9:00 AM  Shanon Ace, LCSW [facility] None  02/24/2021  8:00 AM Leonie Man, MD [facility]    No orders of the defined types were placed in this encounter.     -------------------------------

## 2020-10-16 ENCOUNTER — Telehealth: Payer: Self-pay | Admitting: Psychiatry

## 2020-10-16 NOTE — Telephone Encounter (Signed)
[facility] called to report that she got a letter today saying she needs a PA on her Abilify before they could ship it to her.  Please do a PA if you have not already started one.

## 2020-10-19 ENCOUNTER — Telehealth: Payer: Self-pay

## 2020-10-19 NOTE — Telephone Encounter (Signed)
Prior authorization submitted through [employer] [facility] for Aripiprazole 2 mg take 2 tablets daily #180, pending response at this time.

## 2020-10-19 NOTE — Telephone Encounter (Signed)
Prior Approval Received  [id number]. ARIPIPRAZOLE TAB 2MG  is approved through 12/04/2021. #180

## 2020-10-19 NOTE — Telephone Encounter (Signed)
Prior authorization submitted and approved for ARIPIPRAZOLE 2 MG #180/90 DAY effective through 10/19/2020-12/04/2021 with [employer] [facility] ID# [id number]

## 2020-10-21 ENCOUNTER — Ambulatory Visit (INDEPENDENT_AMBULATORY_CARE_PROVIDER_SITE_OTHER): Payer: 59 | Admitting: Psychiatry

## 2020-10-21 DIAGNOSIS — F3181 Bipolar II disorder: Secondary | ICD-10-CM | POA: Diagnosis not present

## 2020-10-21 NOTE — Progress Notes (Signed)
[facility] Counselor/Therapist Progress Note  Patient ID: Chelsea Benson, MRN: [id number],    Date: 10/21/2020  Time Spent: 50 minutes   9:00am to 9:50am   Virtual Visit via MyChart Note: Connected with patient by a video enabled telemedicine/telehealth application or telephone, with their informed consent, and verified patient privacy and that I am speaking with the correct person using two identifiers. I discussed the limitations, risks, security and privacy concerns of performing psychotherapy and management service by telephone and the availability of in person appointments. I also discussed with the patient that there may be a patient responsible charge related to this service. The patient expressed understanding and agreed to proceed. I discussed the treatment planning with the patient. The patient was provided an opportunity to ask questions and all were answered. The patient agreed with the plan and demonstrated an understanding of the instructions. The patient was advised to call  our office if  symptoms worsen or feel they are in a crisis state and need immediate contact. **(We did experience some issues with the myChart video  remaining on today, but we continued our session as patient was dealing with family crisis.)   Therapist Location: [facility] Patient Location: home    Treatment Type: Individual Therapy  Reported Symptoms: anxiety high, very on edge, sadness mostly regarding family situations  Mental Status Exam:  Appearance:   Casual     Behavior:  Appropriate, Sharing and Motivated  Motor:  Normal  Speech/Language:   Clear and Coherent  Affect:  anxious, depressed  Mood:  anxious, depressed and sad  Thought process:  goal directed  Thought content:    some obsessiveness  Sensory/Perceptual disturbances:    WNL  Orientation:  oriented to person, place, time/date, situation, day of week, month of year and year  Attention:  Fair   Concentration:  Fair  Memory:  some forgetfulness  Fund of knowledge:   Good  Insight:    Fair  Judgment:   Good and Fair  Impulse Control:  Good and Fair   Risk Assessment: Danger to Self:  No Self-injurious Behavior: No Danger to Others: No Duty to Warn:no Physical Aggression / Violence:No  Access to Firearms a concern: No  Gang Involvement:No   Subjective:  Patient reports anxiety increased, sadness re: family situations,depressed, and feeling "on edge" .  Interventions: Solution-Oriented/Positive Psychology and Ego-Supportive  Diagnosis:   ICD-10-CM   1. Bipolar II disorder (HCC)  F31.81      Plan: Patient not signing tx goals on computer screen due to COVID.  Treatment Goals:  Goals remain on tx plan as patient works on strategies to meet her goals. Progress will be documented each visit in "Progress" section on Plan.   Long term goal:(measurable) Reduce overall level, frequency, and intensity of the anxiety so that daily functioning is not impaired.* Patient will eventually report a rating of "4"or less" on 1-10 anxiety scale for a4-month time period where her daily functioning is not impaired.  Short term goal: Increase understanding of beliefs and messages that produce the worry and anxiety.  Strategy: Patient will explore and work to stop cognitive messages that feed anxiety and work to replace them with more positive and empowering messages.   PROGRESS: Patient concerned today and reports "high anxiety, feeling on edge, depressed, and sad, mostly due to family situations. Denies any SI.  Very focused today on 66 yr old grandson who recently "had an emergency with his eyes and vision being seriously  impaired, can only see shadows and is getting treatment to hopefully get his sight back."  Patient upset and needing session today to process her fearful and anxious thoughts, her assumptions, and sadness. Worked with short-term goal and strategy in tx  plan above in helping patient see how her automatic thoughts (anxious/depressive) are not reality-based and lead her to feel more anxious and depressed, also leading her to have more negative assumptions that worry her and yet do not end up coming true. Was calmer and more grounded by session end, and talking about some upcoming plans with family "not necessarily for Thanksgiving" but to have contact before that time.  Reviewed our prior work on stopping her "I cannot" statements and using "I Can" statements, and responded well with this. To remain on her medication as prescribed and continue trying to practice good self-care including staying in contact with others who are supportive, staying in the present versus the past or the future, getting outside some each day, physical movement as she is able, focusing on good sleep, exercising healthy boundaries with others as needed, and making self talk more positive and affirming.  Goal review and progress/challenges noted with patient.  Next appointment within 2 to 3 weeks.   Mathis Fare, LCSW

## 2020-10-22 ENCOUNTER — Other Ambulatory Visit: Payer: Self-pay | Admitting: Allergy

## 2020-10-27 ENCOUNTER — Ambulatory Visit (INDEPENDENT_AMBULATORY_CARE_PROVIDER_SITE_OTHER): Payer: 59 | Admitting: Psychiatry

## 2020-10-27 DIAGNOSIS — F3181 Bipolar II disorder: Secondary | ICD-10-CM | POA: Diagnosis not present

## 2020-10-27 NOTE — Progress Notes (Signed)
[facility] Counselor/Therapist Progress Note  Patient ID: Chelsea Benson, MRN: [id number],    Date: 10/27/2020  Time Spent: 45 minutes    9:05am to 9:75m   Virtual Visit Note via MyChart Note Connected with patient by a video enabled telemedicine/telehealth application or telephone, with their informed consent, and verified patient privacy and that I am speaking with the correct person using two identifiers. I discussed the limitations, risks, security and privacy concerns of performing psychotherapy and management service by telephone and the availability of in person appointments. I also discussed with the patient that there may be a patient responsible charge related to this service. The patient expressed understanding and agreed to proceed. I discussed the treatment planning with the patient. The patient was provided an opportunity to ask questions and all were answered. The patient agreed with the plan and demonstrated an understanding of the instructions. The patient was advised to call  our office if  symptoms worsen or feel they are in a crisis state and need immediate contact.   Therapist Location: [facility] Patient Location: home   Treatment Type: Individual Therapy  Reported Symptoms: anxiety, "no depression"  Mental Status Exam:  Appearance:   Casual     Behavior:  Appropriate, Sharing and Motivated  Motor:  Normal  Speech/Language:   Clear and Coherent  Affect:  anxious  Mood:  anxious  Thought process:  goal directed  Thought content:    some obsessiveness  Sensory/Perceptual disturbances:    WNL  Orientation:  oriented to person, place, time/date, situation, day of week, month of year and year  Attention:  Good  Concentration:  Fair  Memory:  patient report less forgetting recently  Progress Energy of knowledge:   Good  Insight:    Fair  Judgment:   Good  Impulse Control:  Good   Risk Assessment: Danger to Self:  No Self-injurious Behavior:  No Danger to Others: No Duty to Warn:no Physical Aggression / Violence:No  Access to Firearms a concern: No  Gang Involvement:No   Subjective: Patient reports some increased in anxiety recently due to unexpected cousins' death. States her meds are" helping better with her anxiety, sleep, and relaxing."  Interventions: Solution-Oriented/Positive Psychology and Ego-Supportive  Diagnosis:   ICD-10-CM   1. Bipolar II disorder (HCC)  F31.81      Plan: Patient not signing tx goals on computer screen due to COVID.  Treatment Goals:  Goals remain on tx plan as patient works on strategies to meet her goals. Progress will be documented each visit in "Progress" section on Plan.   Long term goal:(measurable) Reduce overall level, frequency, and intensity of the anxiety so that daily functioning is not impaired.* Patient will eventually report a rating of "4"or less" on 1-10 anxiety scale for a42-month time period where her daily functioning is not impaired.  Short term goal: Increase understanding of beliefs and messages that produce the worry and anxiety.  Strategy: Patient will explore and work to stop cognitive messages that feed anxiety and work to replace them with more positive and empowering messages.   PROGRESS: Patient today reporting increased anxiety recently due to unexpected cousin's death, and states "I worry more when unexpected bad things happen." Processed her thoughts and feelings re: cousin's death, and also how holidays can be good but also be difficult due to losses, difficult relationships, and stress.   Despite her feeling more anxious, she states she is "not as on edge", and is talking out her feelings  a lot "so that is helping."  Grandson she was concerned about last session is improving in his eyesight so anxiety about his situation has lessened for patient. Worked with some of her more recent anxious thoughts, challenging them, and re-framed them to be  more accurate, encouraging, and not fear-inducing.  Having difficulty not assuming something will go wrong, because that helps me be on guard.  Looked at this with her and eventually she was able to see how that tends to set her up to see negatives more than positives. Discussed how she could switch her thinking to look more for the positives and have better outcomes.  She agrees to practice this between sessions, along with setting boundaries with others as needed, decreasing her "I cannot" statements, having contact with supportive people, remain on her meds as prescribed, staying in the present, and using positive self-talk.  Goal review and progress/challenges noted with patient.  :Next appt within 2-3 weeks.   Mathis Fare, LCSW

## 2020-10-28 ENCOUNTER — Other Ambulatory Visit: Payer: Self-pay | Admitting: Allergy

## 2020-10-28 DIAGNOSIS — J4540 Moderate persistent asthma, uncomplicated: Secondary | ICD-10-CM

## 2020-11-03 ENCOUNTER — Telehealth: Payer: Self-pay

## 2020-11-03 ENCOUNTER — Telehealth: Payer: Self-pay | Admitting: Allergy

## 2020-11-03 MED ORDER — FLUTICASONE PROPIONATE 50 MCG/ACT NA SUSP: NASAL | 0 refills | Status: DC

## 2020-11-03 NOTE — Telephone Encounter (Signed)
Patient received her Flonase in the mail and it was only a one month supply instead of three month supply. Patient would like to know if her next refill can be a three month supply.   Please advise.

## 2020-11-03 NOTE — Telephone Encounter (Signed)
Left pt. A message to call office to schedule her 6 month appointment with Dr. Delorse Lek. Pt. Really doesn't need a 6 month appointment until January so we can fill her symbicort 160 mcg one more time.

## 2020-11-03 NOTE — Telephone Encounter (Signed)
Patient made an apt for 3 months. Sent in rx for flonase 90 day supply.

## 2020-11-09 NOTE — Telephone Encounter (Signed)
Pt. Has an appointment January 19th 2022 with Dr. Delorse Lek.

## 2020-11-10 ENCOUNTER — Ambulatory Visit (INDEPENDENT_AMBULATORY_CARE_PROVIDER_SITE_OTHER): Payer: 59 | Admitting: Psychiatry

## 2020-11-10 DIAGNOSIS — F3181 Bipolar II disorder: Secondary | ICD-10-CM | POA: Diagnosis not present

## 2020-11-10 NOTE — Progress Notes (Signed)
[facility] Counselor/Therapist Progress Note  Patient ID: Chelsea Benson, MRN: [id number],    Date: 11/10/2020  Time Spent: 50 minutes   8:54m to 9:45am   Virtual Visit via Telephone Note : [employer] but had tech issues and called Medicare to get approval to use phone only:  Connected with patient by a video enabled telemedicine/telehealth application or telephone, with their informed consent, and verified patient privacy and that I am speaking with the correct person using two identifiers. I discussed the limitations, risks, security and privacy concerns of performing psychotherapy and management service by telephone and the availability of in person appointments. I also discussed with the patient that there may be a patient responsible charge related to this service. The patient expressed understanding and agreed to proceed. I discussed the treatment planning with the patient. The patient was provided an opportunity to ask questions and all were answered. The patient agreed with the plan and demonstrated an understanding of the instructions. The patient was advised to call  our office if  symptoms worsen or feel they are in a crisis state and need immediate contact.   Therapist Location: [facility] Patient Location: home   Treatment Type: Individual Therapy  Reported Symptoms: Anxiety, "but I've had a bad week with it and I can't do anything about some things that make me anxious"  Mental Status Exam:  Appearance:   N/a  telehealth    Behavior:  Appropriate, Sharing and Motivated  Motor:  n/a  telehealth  Speech/Language:   Clear and Coherent  Affect:  anxious  Mood:  anxious  Thought process:  goal directed  Thought content:    some ruminating at times  Sensory/Perceptual disturbances:    WNL  Orientation:  oriented to person, place, time/date, situation, day of week, month of year and year  Attention:  Good  Concentration:  Good and Fair  Memory:   some forgetting  Fund of knowledge:   Good  Insight:    Good and Fair  Judgment:   Good  Impulse Control:  Good and Fair   Risk Assessment: Danger to Self:  No Self-injurious Behavior: No Danger to Others: No Duty to Warn:no Physical Aggression / Violence:No  Access to Firearms a concern: No  Gang Involvement:No   Subjective: Patient today reports anxiety, "but have had a bad week with things that make me anxious."  Interventions: Solution-Oriented/Positive Psychology and Ego-Supportive  Diagnosis:   ICD-10-CM   1. Bipolar II disorder (HCC)  F31.81     Plan: Patient not signing tx goals on computer screen due to COVID.  Treatment Goals:  Goals remain on tx plan as patient works on strategies to meet her goals. Progress will be documented each visit in "Progress" section on Plan.   Long term goal:(measurable) Reduce overall level, frequency, and intensity of the anxiety so that daily functioning is not impaired.* Patient will eventually report a rating of "4"or less" on 1-10 anxiety scale for a40-month time period where her daily functioning is not impaired.  Short term goal: Increase understanding of beliefs and messages that produce the worry and anxiety.  Strategy: Patient will explore and work to stop cognitive messages that feed anxiety and work to replace them with more positive and empowering messages.   PROGRESS: Patient today reporting increased anxiety related to 48yr old grand-daughter, anniversary dates that are sad for patient, and issues with alcoholic brother that lives in same house as patient. Concerned about brother-in-law with cancer  and they've stopped his chemo/radiation "due to it not being effective".  Grandson with recent vision issues is improving and she feels more encouraged about him.  With multiple family losses (anniversary dates) and current family situations of concern, patient states she is struggling some but also understands that  "I can't fix any of this", just hard not to dwell on anxious/fearful thoughts/feelings.  Worked with her more on short term goal and strategy in Tx Plan above, focusing on recognizing anxious/fearful thoughts more quickly, and not get so overwhelmed with the thoughts and instead realize based on past experiences that her anxiety responses do not help her manage the anxiety better, and instead focus more on self-talk and behaviors that can be more calming including deep breathing exercises and positive, reality-based self-talk.  Patient reports trying strategies but they are difficult to maintain.  Is to focus on looking for more positives than negatives each day, along with not looking for worse case scenario to happen.  She has tended to have the thought that if she" looks for what will go wrong, that will help her protect herself and deal with what ever happens", and it is very hard for her to let go of that.  Has made a bit of progress at times but hard to maintain.  Does state that it helps to talk through all of these issues.  Encouraged her to practice more more of the looking for the positive versus negative even though it is challenging for her, to remain on her meds as prescribed, to try having fewer "I cannot" statements, and remain in contact with supportive people.  Goal review and progress/challenges noted with patient.  Next appointment within 2 to 3 weeks.   Mathis Fare, LCSW

## 2020-11-12 ENCOUNTER — Other Ambulatory Visit: Payer: Self-pay | Admitting: Internal Medicine

## 2020-11-17 ENCOUNTER — Telehealth: Payer: Self-pay | Admitting: Psychiatry

## 2020-11-17 DIAGNOSIS — F5101 Primary insomnia: Secondary | ICD-10-CM

## 2020-11-17 MED ORDER — ZOLPIDEM TARTRATE 10 MG PO TABS: 5.0000 mg | ORAL_TABLET | Freq: Every evening | ORAL | 0 refills | Status: DC | PRN

## 2020-11-17 NOTE — Telephone Encounter (Signed)
sent 

## 2020-11-17 NOTE — Telephone Encounter (Signed)
Pt would like a refill on ambien. Please send to [facility] [phone].

## 2020-11-19 ENCOUNTER — Other Ambulatory Visit: Payer: Self-pay | Admitting: Family Medicine

## 2020-11-23 ENCOUNTER — Telehealth: Payer: Self-pay | Admitting: Cardiology

## 2020-11-23 NOTE — Telephone Encounter (Signed)
Pt states she had a recurrence of her palpitations this past Thursday. She could feel a strong pounding in her chest that lasted for several minutes. He EMAY at-home rhythm device showed her to be in "tachycardia" with a rate of 154 BPM. Later that evening her heart rate climbed to 168 BMP per her device. She denies any chest pain, shortness of breath or additional symptoms at this time. She states she had a second episode again on Friday morning lasting 10 minutes. She has not had any more palpitations since then. Today her HR is 80 BPM and her blood pressure is 120/90. She states she is faithfully taking all her medications as prescribed and denies use of any caffeine. She reports that she is well hydrated.  We will also reach out to MD for any recommendations. The patient verbalizes understanding and agreement with plan. Pt also reminded of her next office visit with Dr. Herbie Baltimore on 12/31/2020.

## 2020-11-23 NOTE — Telephone Encounter (Signed)
Patient c/o Palpitations:  High priority if patient c/o lightheadedness, shortness of breath, or chest pain  1) How long have you had palpitations/irregular HR/ Afib? Are you having the symptoms now?  Restated back up last week .    2) Are you currently experiencing lightheadedness, SOB or CP? No , denies any of these symptoms   3) Do you have a history of afib (atrial fibrillation) or irregular heart rhythm?  No   4) Have you checked your BP or HR? (document readings if available):  BP today 120/90 HR  80   5) Are you experiencing any other symptoms?  Headache, discomfort in her left leg   Best number [phone]

## 2020-11-24 ENCOUNTER — Ambulatory Visit (INDEPENDENT_AMBULATORY_CARE_PROVIDER_SITE_OTHER): Payer: 59 | Admitting: Psychiatry

## 2020-11-24 DIAGNOSIS — F419 Anxiety disorder, unspecified: Secondary | ICD-10-CM

## 2020-11-24 NOTE — Telephone Encounter (Signed)
It would be helpful if we could actually look at the strips from her home monitor.  I do not see that she is on any kind of AV nodal agent.  If were not able to tell the rhythm on her monitor, we should probably set her up to wear an event monitor for 14-30 days depending on how frequently she is having them.  Hard to know what we are treating.  With discussed vagal maneuvers with her i.e. bearing down, Valsalva, coughing, drinking ice water.  Bryan Lemma, MD

## 2020-11-24 NOTE — Progress Notes (Signed)
[facility] Counselor/Therapist Progress Note  Patient ID: Chelsea Benson, MRN: [id number],    Date: 11/24/2020  Time Spent: 50 minutes   9:00am to 9:50am   Virtual Visit Note via MyChart, then had to convert to Phone: Started session with patient today logging into my chart however she had a lot of difficulty on her end.  It was very frustrating and disappointing to her for her not to be able to get the MyChart open up on her phone, and normally she does not have an issue with this.  After several efforts, we converted to just using the telephone for visit because I knew she did not need to miss her visit and we had made several efforts to get my chart to work correctly on her and.  Therapist is providing this statement and hopes that insurance will recognize our efforts to comply with using MyChart, however when it would not work, had to resort to telephone so as to meet patient's needs during this difficult time for her just prior to the holidays.  Thank you. Connected with patient by a video enabled telemedicine/telehealth application or telephone, with their informed consent, and verified patient privacy and that I am speaking with the correct person using two identifiers. I discussed the limitations, risks, security and privacy concerns of performing psychotherapy and management service by telephone and the availability of in person appointments. I also discussed with the patient that there may be a patient responsible charge related to this service. The patient expressed understanding and agreed to proceed. I discussed the treatment planning with the patient. The patient was provided an opportunity to ask questions and all were answered. The patient agreed with the plan and demonstrated an understanding of the instructions. The patient was advised to call  our office if  symptoms worsen or feel they are in a crisis state and need immediate contact.   [facility]rap[facility] Location: [facility] Patient Location: home  Treatment Type: Individual Therapy  Reported Symptoms:  Anxiety ("my main symptom), "no depression this time"  Mental Status Exam:  Appearance:   n/a  telehealth     Behavior:  Appropriate, Sharing and Motivated  Motor:  n/a  telehealth  Speech/Language:   Clear and Coherent  Affect:  n/a  telehealth  Mood:  anxious and irritable "at times"  Thought process:  goal directed  Thought content:    WNL  Sensory/Perceptual disturbances:    WNL  Orientation:  oriented to person, place, time/date, situation, day of week, month of year and year  Attention:  Good  Concentration:  Fair  Memory:  patient reports none  Fund of knowledge:   Good  Insight:    Good and Fair  Judgment:   Good and Fair  Impulse Control:  Good   Risk Assessment: Danger to Self:  No Self-injurious Behavior: No Danger to Others: No Duty to Warn:no Physical Aggression / Violence:No  Access to Firearms a concern: No  Gang Involvement:No   Subjective: Patient today reports increased anxiety with holidays approaching and having more anxiety and heart palpitations ("already been in touch with her PCP and heart doctor").   Interventions: Solution-Oriented/Positive Psychology and Ego-Supportive  Diagnosis:   ICD-10-CM   1. Anxiety disorder, unspecified type  F41.9     Plan: Patient not signing tx goals on computer screen due to COVID.  Treatment Goals:  Goals remain on tx plan as patient works on strategies to meet her goals. Progress will be documented  each visit in "Progress" section on Plan.   Long term goal:(measurable) Reduce overall level, frequency, and intensity of the anxiety so that daily functioning is not impaired.* Patient will eventually report a rating of "4"or less" on 1-10 anxiety scale for a33-month time period where her daily functioning is not impaired.  Short term goal: Increase understanding of beliefs and messages that produce the worry  and anxiety.  Strategy: Patient will explore and work to stop cognitive messages that feed anxiety and work to replace them with more positive and empowering messages.   PROGRESS: Patient today reports anxiety is her main symptom. States that" her anxiety seems to mostly come from within family especially alcoholic brother, and health concerns.  Grandson's vision issues that had been a stressor for patient, has improved.  Stressed with other family situations over which she has no control and did seem to benefit from talking them through in session today.  Discussed difficulty with "things out of my control that bother me." Tried to work with her strategy in goal plan above, applying it to typical situations that create anxiety for patient.  Patient seems to make gains but does find it difficult to later apply that in situations as they unfold.  Worked with some self talk strategies and reviewed deep breathing exercises for patient to use with anxiety symptoms.  Continues to use her medication to help control anxiety.  Encouraged her to maintain on her meds as prescribed, to practice some of the strategies used today in managing her anxiety, and to stay in contact with people that are supportive of her.  Goal review and progress/challenges noted with patient.  Next appointment within 2 to 3 weeks.   Mathis Fare, LCSW

## 2020-11-24 NOTE — Telephone Encounter (Signed)
Spoke to patient . She states she can save the  Strips and will  Send a copy to office once the episode occurs again. She states she di not save the previous  Strips -"something is wrong with her  Bluetooth."  She states her daughter will help. She also states she tried  The vagal maneuvers it seemed to help. RN informed her to continue do vagal maneuvers and monitor. If unable to  See strips from the  [facility] monitor would like for patient to have an  14- 30 day monitor ordered.; Patient voiced understanding.

## 2020-11-28 ENCOUNTER — Other Ambulatory Visit: Payer: Self-pay | Admitting: Internal Medicine

## 2020-12-10 ENCOUNTER — Ambulatory Visit (INDEPENDENT_AMBULATORY_CARE_PROVIDER_SITE_OTHER): Payer: 59 | Admitting: Psychiatry

## 2020-12-10 DIAGNOSIS — F419 Anxiety disorder, unspecified: Secondary | ICD-10-CM | POA: Diagnosis not present

## 2020-12-10 NOTE — Progress Notes (Signed)
[facility] Counselor/Therapist Progress Note  Patient ID: Chelsea Benson, MRN: [id number],    Date: 12/10/2020  Time Spent:  50 minutes   3:00pm to 3:50pm  Virtual Visit  Note via MyChart Video Connected with patient by a video enabled telemedicine/telehealth application or telephone, with their informed consent, and verified patient privacy and that I am speaking with the correct person using two identifiers. I discussed the limitations, risks, security and privacy concerns of performing psychotherapy and management service by telephone and the availability of in person appointments. I also discussed with the patient that there may be a patient responsible charge related to this service. The patient expressed understanding and agreed to proceed. I discussed the treatment planning with the patient. The patient was provided an opportunity to ask questions and all were answered. The patient agreed with the plan and demonstrated an understanding of the instructions. The patient was advised to call  our office if  symptoms worsen or feel they are in a crisis state and need immediate contact.   Therapist Location: [facility] Patient Location: home   Treatment Type: Individual Therapy  Reported Symptoms: anxiety  Mental Status Exam:  Appearance:   Casual     Behavior:  Appropriate, Sharing and Motivated  Motor:  Normal  Speech/Language:   Clear and Coherent  Affect:  anxious  Mood:  anxious  Thought process:  goal directed  Thought content:    some ruminating   Sensory/Perceptual disturbances:    WNL  Orientation:  oriented to person, place, time/date, situation, day of week, month of year and year  Attention:  Good  Concentration:  Fair  Memory:  WNL  Fund of knowledge:   Good  Insight:    Good  Judgment:   Good  Impulse Control:  Fair   Risk Assessment: Danger to Self:  No Self-injurious Behavior: No Danger to Others: No Duty to Warn:no Physical  Aggression / Violence:No  Access to Firearms a concern: No  Gang Involvement:No   Subjective: Patient today reporting anxiety and easily frustrated at times. Holidays went ok with family.  Interventions: Solution-Oriented/Positive Psychology and Ego-Supportive  Diagnosis:   ICD-10-CM   1. Anxiety disorder, unspecified type  F41.9     Plan: Patient not signing tx goals on computer screen due to COVID.  Treatment Goals:  Goals remain on tx plan as patient works on strategies to meet her goals. Progress will be documented each visit in "Progress" section on Plan.   Long term goal:(measurable) Reduce overall level, frequency, and intensity of the anxiety so that daily functioning is not impaired.* Patient will eventually report a rating of "4"or less" on 1-10 anxiety scale for a110-month time period where her daily functioning is not impaired.  Short term goal: Increase understanding of beliefs and messages that produce the worry and anxiety.  Strategy: Patient will explore and work to stop cognitive messages that feed anxiety and work to replace them with more positive and empowering messages.   PROGRESS: Patient is in today reporting anxiety and upset/frustrated with the Covid continuing and getting worse again.  Worries and gets angry because it keeps her from getting out as much. Discussed her worries and frustrations in more detail, talking almost nonstop about various family members, Covid, and her upcoming classes at [employer].  Patient seems to be doing better today with less anxiety than usual.  Reports on a 1-10 scale of anxiety, she is currently a 5, she is good for her although  we do want that number lower eventually.  See long term goal and treatment plan above noting that we are aiming for patient to be able to self rates herself as a 4 consistently for a couple months and she and I both think that is eventually possible.  Reviewed with patient strategies and steps that  we have previously discussed and she has found to be of some help before and managing anxiety. States that mother has not been drinking as much recently and that has made things some calmer.  Also her grandchildren did not stay as long at her house during the holidays and that helped her anxiety level.  Is not as worried about adult grandson as his previous vision issues have cleared up and she is now encouraging him to get a job.  Still stressed over some family illnesses but in comparison to recent weeks, she is definitely less anxious.  Encouraged her to keep trying to stay in the present rather than the past or the future as that increases her worry, to remain on her meds as prescribed, to get outside a little bit each day when possible, and to stay in contact with people that are supportive of her.  Review and progress/challenges noted with patient.  Next appointment within 2-3 weeks.   Mathis Fare, LCSW

## 2020-12-14 ENCOUNTER — Other Ambulatory Visit: Payer: Self-pay | Admitting: Psychiatry

## 2020-12-14 DIAGNOSIS — F3181 Bipolar II disorder: Secondary | ICD-10-CM

## 2020-12-21 ENCOUNTER — Ambulatory Visit (INDEPENDENT_AMBULATORY_CARE_PROVIDER_SITE_OTHER): Payer: 59 | Admitting: Psychiatry

## 2020-12-21 DIAGNOSIS — F419 Anxiety disorder, unspecified: Secondary | ICD-10-CM
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
## 2020-12-23 ENCOUNTER — Ambulatory Visit: Payer: Self-pay | Admitting: Allergy

## 2020-12-31 ENCOUNTER — Encounter: Payer: Self-pay | Admitting: *Deleted

## 2020-12-31 ENCOUNTER — Ambulatory Visit (INDEPENDENT_AMBULATORY_CARE_PROVIDER_SITE_OTHER): Payer: 59 | Admitting: Cardiology

## 2020-12-31 ENCOUNTER — Encounter: Payer: Self-pay | Admitting: Cardiology

## 2020-12-31 ENCOUNTER — Other Ambulatory Visit: Payer: Self-pay

## 2020-12-31 VITALS — BP 130/83 | HR 81 | Ht 64.0 in | Wt 174.6 lb

## 2020-12-31 DIAGNOSIS — I10 Essential (primary) hypertension: Secondary | ICD-10-CM

## 2020-12-31 DIAGNOSIS — E785 Hyperlipidemia, unspecified: Secondary | ICD-10-CM | POA: Diagnosis not present

## 2020-12-31 DIAGNOSIS — R002 Palpitations: Secondary | ICD-10-CM

## 2020-12-31 DIAGNOSIS — R071 Chest pain on breathing: Secondary | ICD-10-CM | POA: Diagnosis not present

## 2020-12-31 DIAGNOSIS — J4541 Moderate persistent asthma with (acute) exacerbation: Secondary | ICD-10-CM

## 2020-12-31 DIAGNOSIS — R252 Cramp and spasm: Secondary | ICD-10-CM

## 2020-12-31 MED ORDER — DILTIAZEM HCL 60 MG PO TABS: ORAL_TABLET | ORAL | 6 refills | Status: DC

## 2020-12-31 NOTE — Progress Notes (Signed)
Primary Care Provider: Marda Stalker, PA-C Cardiologist: Glenetta Hew, MD Electrophysiologist: None  Clinic Note: Chief Complaint  Patient presents with  . Palpitations    Noticing more fast heart rate spells  . Follow-up    ===================================  ASSESSMENT/PLAN   Problem List Items Addressed This Visit    Hyperlipidemia with target LDL less than 100 (Chronic)    Unfortunately, I do not have any recent labs.  She remains on pravastatin 20 mg daily.  Aspirin follow-up with PCP.  Should be due to be rechecked this summer.   Last available labs from May 2021 showed LDL of 86.  Relatively well controlled.      Relevant Medications   diltiazem (CARDIZEM) 60 MG tablet   Essential hypertension (Chronic)    Decent blood pressure.  Continue current dose of diltiazem along with HCTZ. Depression increase, with her being a diabetic, would probably benefit from ARB, however she has had issues with ARB use in the past.      Relevant Medications   diltiazem (CARDIZEM) 60 MG tablet   Pain of anterior chest wall with respiration (Chronic)    She has intermittent episodes of chest pain-notable with deep inspiration, coughing and is reproducible with palpation.  Unlikely cardiac in nature.  Atypical symptoms most consistent with musculoskeletal chest pain.  Discussed using Tylenol or NSAIDs.      Relevant Orders   EKG 12-Lead (Completed)   Moderate persistent asthma with acute exacerbation (Chronic)    I suspect that some of her dyspnea, and edema chest pain from coughing is related to asthma.      Rapid palpitations - Primary (Chronic)    Controlled with diltiazem, but having breakthrough spells now every couple weeks.  I would like to see what it is were treating.  Need to see if this is A. fib versus SVT or simply just PACs/PVCs.  Plan:   30-day event monitor,   Continue current dose of diltiazem XT,  PRN short acting diltiazem 60 mg for breakthrough  tachycardia      Relevant Orders   EKG 12-Lead (Completed)   CARDIAC EVENT MONITOR   Leg cramps - Left Calf    She has cramping pain in her legs is not associate with exertion.  Probably related to muscle strain.  Does not sound like myalgias or myopathy from statin.  Recommended PRN mustard or vinegar         ===================================  HPI:    Chelsea Benson is a 70 y.o. female with a PMH of hypertension, hyperlipidemia, asthma and palpitations who presents today for 38-monthfollow-up of palpitations.  Chelsea HUARDwas seen on June 17, 2020 for chest discomfort and one episode of tachycardia. --> We ordered a coronary CTA; she also started using her EMAY monitor on her phone (covered by insurance over [employer])  Chelsea KIMMONSwas last seen on 08/24/20 -> no further chest pain.  And had a reassuring coronary CTA.  Using home [employer] to try to capture arrhythmias, but difficult strips to review. ->    Increased diltiazem to 240 mg daily for blood pressure and palpitations  If symptoms become frequent enough, would like to try to capture with a true event monitor.  Recent Hospitalizations:   ER visit on 08/29/2020 for chronic back pain  Reviewed  CV studies:    The following studies were reviewed today: (if available, images/films reviewed: From Epic Chart or Care Everywhere) . N/A:   Interval History:  Chelsea Benson returns here today with several complaints:  She notes that she is having palpitation episodes of fast heart rates 2-3 times a month now-they last about 15 to 20 minutes.  Most recently was this past Saturday-heart rate went to 154.  She also had 1 in December that left about 20 minutes with a heart rate 164 bpm.  One of the 2 was read by her home monitor as tachycardia, the other was read as possible atrial fibrillation.  She notes occasional left calf cramping at night  She notes occasional dull aching sensation in the center of  her chest worse with coughing or deep inspiration.  Not occurring with exertion.  She also has seasonal allergies with congestion and coughing/wheezing.  Silodosin  CV Review of Symptoms (Summary): positive for - chest pain, palpitations, rapid heart rate, shortness of breath and Now having tachycardia spells maybe once every 2 weeks.  Not lasting more than 15-20 min.   negative for - edema, orthopnea, paroxysmal nocturnal dyspnea or Maybe little lightheadedness but no syncope or near syncope, TIA or amaurosis fugax.  Claudication    The patient does not have symptoms concerning for COVID-19 infection (fever, chills, cough, or new shortness of breath).   REVIEWED OF SYSTEMS   Review of Systems  Constitutional: Negative for malaise/fatigue (Energy level seems doing better.) and weight loss.  HENT: Positive for congestion.   Respiratory: Positive for cough, shortness of breath and wheezing. Negative for sputum production.   Cardiovascular: Positive for chest pain (Central chest tightness and pressure usually at rest.  Lasts 15 to 20 minutes.  Not exacerbated by walking.).  Gastrointestinal: Negative for blood in stool and melena.  Genitourinary: Negative for hematuria.  Musculoskeletal: Positive for back pain, joint pain (Knee pain-walks with a walker) and myalgias (Left calf cramps sometimes at night.  Maybe once or twice a week).  Neurological: Negative for dizziness and focal weakness.  Psychiatric/Behavioral: Negative for depression and memory loss. The patient is nervous/anxious. The patient does not have insomnia.     I have reviewed and (if needed) personally updated the patient's problem list, medications, allergies, past medical and surgical history, social and family history.   PAST MEDICAL HISTORY   Past Medical History:  Diagnosis Date  . Allergic rhinoconjunctivitis   . Allergy history unknown   . Anxiety   . Arthritis    DDD, spondylosis  . Asthma   . Bipolar disorder  ([place])   . Chronic back pain    Has had several surgeries.  He uses a walker  . Chronic kidney disease    renal calculi  . Depression   . Diabetes mellitus without complication ([place])    dx in 2007  . Diabetes mellitus, type II ([place])   . Eczema   . Headache(784.0)    sinus related   . History of kidney stones   . Hx of blood clots    lungs  . Hx of pulmonary embolus 2017  . Hyperlipidemia   . Hypertension   . Insomnia   . Knee pain   . Left ankle pain   . Memory change   . Mental disorder   . Mood disorder ([place])   . OSA on CPAP    PSG 05/24/2016: AHI 17/hour 02 mean 76%.  HSAT 06/13/17, AHI 11-hour 02 mean 79%  . Recurrent upper respiratory infection (URI)   . Sleep apnea   . Tremor of both hands     PAST SURGICAL HISTORY  Past Surgical History:  Procedure Laterality Date  . ABDOMINAL HYSTERECTOMY    . BACK SURGERY     2012- lumbar fusion  . BREAST SURGERY     L cyst removed - 1972  . BUNIONECTOMY Left    Great toe  . calf -R- cyst removed    . CARPAL TUNNEL RELEASE     both hands   . CORONARY CT ANGIOGRAM  07/17/2019    Coronary calcium score 0.  Right dominant system.  Difficult to assess because of cardiac motion, but there is no obvious evidence of CAD.  Mild PA dilation.   Marland Kitchen HAMMER TOE SURGERY Left    L foot  . NASAL SINUS SURGERY    . NM MYOVIEW LTD  12/2008   Normal study.  Normal LV function.  EF 61%.  Apical thinning but no ischemia or infarction.  Marland Kitchen SHOULDER ARTHROSCOPY Left    R shoulder- RCR  . TOE SURGERY    . TOTAL KNEE ARTHROPLASTY Right 05/22/2017   Procedure: TOTAL KNEE ARTHROPLASTY;  Surgeon: Frederik Pear, MD;  Location: [facility];  Service: Orthopedics;  Laterality: Right;  . TRANSTHORACIC ECHOCARDIOGRAM  06/2009    Technically difficult.  Moderate concentric LVH with normal LV function.  No regional wall motion normalities.  EF> 55%.  Normal right ventricle.  No valve lesions.  Normal filling pressures.  Marland Kitchen TRIGGER FINGER RELEASE     L thumb  .  TUBAL LIGATION      Immunization History  Administered Date(s) Administered  . Influenza Split 09/04/2010  . Influenza, High Dose Seasonal PF 11/11/2011  . Influenza,trivalent, recombinat, inj, PF 12/07/2012, 10/04/2013  . PFIZER(Purple Top)SARS-COV-2 Vaccination 01/28/2020, 02/19/2020    MEDICATIONS/ALLERGIES   Current Meds  Medication Sig  . ACCU-CHEK GUIDE test strip TEST BID PRN  . albuterol (PROVENTIL) (2.5 MG/3ML) 0.083% nebulizer solution USE 1 VIAL VIA NEBULIZER EVERY 4 HRS AS NEEDED FOR WHEEZING OR SHORTNESS OF BREATH. MANUFACTURER RECOMMENDS NOT EXCEEDING 4 VIALS PER DAY  . albuterol (VENTOLIN HFA) 108 (90 Base) MCG/ACT inhaler Inhale 2 puffs into the lungs every 4 (four) hours as needed.  . ARIPiprazole (ABILIFY) 2 MG tablet TAKE 2 TABLETS BY MOUTH  DAILY  . aspirin EC 81 MG tablet Take 81 mg by mouth daily.  Marland Kitchen azelastine (ASTELIN) 0.1 % nasal spray Place 1 spray into both nostrils 2 (two) times daily.  . Blood Glucose Monitoring Suppl (ACCU-CHEK GUIDE) w/Device KIT See admin instructions.  . busPIRone (BUSPAR) 30 MG tablet Take 1 tablet (30 mg total) by mouth 2 (two) times daily.  Marland Kitchen CLOBETASOL PROPIONATE E 0.05 % emollient cream Apply 1 application topically 2 (two) times daily as needed (rash).   . clonazePAM (KLONOPIN) 0.5 MG tablet Take 1/2-1 tab po TID prn anxiety. (Do not combine with Ambien)  . desonide (DESOWEN) 0.05 % ointment APPLY TO AFFECTED AREA(S)  TOPICALLY TWICE DAILY AS  NEEDED  . diltiazem (CARDIZEM) 60 MG tablet Take Diltiazem 60 mg by mouth as needed  for palpitations. You can take every 6 hours as need up to twice a day with each episode.  Marland Kitchen EPINEPHRINE 0.3 mg/0.3 mL IJ SOAJ injection INJECT CONTENTS OF 1 PEN AS NEEDED FOR ALLERGIC  RESPONSE AS DIRECTED BY MD. SEEK MEDICAL ATTENTION  AFTER USE.  . famotidine (PEPCID) 20 MG tablet TAKE 1 TABLET BY MOUTH  DAILY AFTER SUPPER.  . fluticasone (FLONASE) 50 MCG/ACT nasal spray USE 2 SPRAYS IN BOTH  NOSTRILS DAILY   . hydrochlorothiazide (HYDRODIURIL)  25 MG tablet Take 25 mg by mouth daily.   . hydrOXYzine (ATARAX/VISTARIL) 25 MG tablet Take 1 tablet (25 mg total) by mouth every 6 (six) hours as needed.  Marland Kitchen levocetirizine (XYZAL) 5 MG tablet Take 1 tablet (5 mg total) by mouth daily as needed for allergies.  . meloxicam (MOBIC) 15 MG tablet Take 1 tablet (15 mg total) by mouth daily.  . Mepolizumab (NUCALA) 100 MG/ML SOAJ Inject 100 mg into the skin every 28 (twenty-eight) days.  . metFORMIN (GLUCOPHAGE-XR) 500 MG 24 hr tablet Take 500 mg by mouth 1 day or 1 dose.   . mirtazapine (REMERON) 30 MG tablet TAKE 1 TABLET BY MOUTH AT  BEDTIME  . montelukast (SINGULAIR) 10 MG tablet TAKE 1 TABLET BY MOUTH AT  BEDTIME  . Oxcarbazepine (TRILEPTAL) 300 MG tablet Take 2 tablets (600 mg total) by mouth at bedtime.  . pantoprazole (PROTONIX) 40 MG tablet TAKE 1 TABLET BY MOUTH  DAILY. TAKE 30 TO 60  MINUTES BEFORE FIRST MEAL  OF THE DAY  . potassium chloride SA (K-DUR) 20 MEQ tablet Take 20 mEq by mouth daily.  . pravastatin (PRAVACHOL) 20 MG tablet Take 20 mg by mouth at bedtime.   . pregabalin (LYRICA) 100 MG capsule Take 100 mg by mouth 3 (three) times daily.  Marland Kitchen PROAIR HFA 108 (90 Base) MCG/ACT inhaler Inhale 2 puffs into the lungs every 4 (four) hours as needed for wheezing or shortness of breath.  Marland Kitchen Respiratory Therapy Supplies (NEBULIZER) DEVI Use as directed with nebulizer solution.  Marland Kitchen Respiratory Therapy Supplies (NEBULIZER/TUBING/MOUTHPIECE) KIT Use as directed with nebulizer machine  . Semaglutide, 1 MG/DOSE, (OZEMPIC, 1 MG/DOSE,) 2 MG/1.5ML SOPN Take 1 mg by mouth once a week.  . Suvorexant (BELSOMRA) 20 MG TABS Take 20 mg by mouth at bedtime.  . SYMBICORT 160-4.5 MCG/ACT inhaler INHALE 2 INHALATIONS BY  MOUTH INTO THE LUNGS IN THE MORNING AND AT BEDTIME  . tiZANidine (ZANAFLEX) 4 MG tablet Take 1 tablet (4 mg total) by mouth every 6 (six) hours as needed for muscle spasms.  Marland Kitchen zolpidem (AMBIEN) 10 MG tablet  Take 0.5-1 tablets (5-10 mg total) by mouth at bedtime as needed. for sleep    Allergies  Allergen Reactions  . Cymbalta [Duloxetine Hcl] Other (See Comments)    Caused hair loss  . Depo-Medrol [Methylprednisolone Acetate] Hives    05/29/20 developed hives "all over my back" after MBB; resolved with Benadryl and "a few days."  Can tolerate Dexamethasone/Decadron.  . Rosuvastatin Calcium Other (See Comments) and Cough    Rapid heart rate  . Almond Meal Itching and Swelling    Tongue swells and itches  . Almond Oil Itching and Swelling    Itching and swelling of tongue   . Carvedilol Cough  . Crestor [Rosuvastatin] Cough  . Fish Allergy Itching    Halibut fish  . Losartan Potassium Cough  . Morphine And Related Itching    SOCIAL HISTORY/FAMILY HISTORY   Reviewed in Epic:  Pertinent findings:  Social History   Tobacco Use  . Smoking status: Passive Smoke Exposure - Never Smoker  . Smokeless tobacco: Never Used  . Tobacco comment: grandson smokes, but in his car  Vaping Use  . Vaping Use: Never used  Substance Use Topics  . Alcohol use: Yes    Comment: rare use  . Drug use: No   Social History   Social History Narrative   She is currently single.  Has 2 girls.  Has some  college education.  Currently disabled due to chronic back pain and not employed.   07/01/19 lives with brother Shanon Brow   Caffeine, none    OBJCTIVE -PE, EKG, labs   Wt Readings from Last 3 Encounters:  12/31/20 174 lb 9.6 oz (79.2 kg)  08/24/20 180 lb (81.6 kg)  06/17/20 175 lb 12.8 oz (79.7 kg)   Physical Exam: BP 130/83   Pulse 81   Ht 5' 4"  (1.626 m)   Wt 174 lb 9.6 oz (79.2 kg)   SpO2 96%   BMI 29.97 kg/m  Physical Exam Vitals reviewed.  Constitutional:      General: She is not in acute distress.    Appearance: She is obese. She is not ill-appearing or toxic-appearing.  HENT:     Head: Normocephalic and atraumatic.  Neck:     Vascular: No carotid bruit, hepatojugular reflux or JVD.   Cardiovascular:     Rate and Rhythm: Normal rate and regular rhythm. Occasional extrasystoles are present.    Chest Wall: PMI is not displaced (Unable to palpate).     Pulses: Decreased pulses (Difficult to palpate due to body habitus.).     Heart sounds: S1 normal and S2 normal. Heart sounds are distant. Murmur (Soft 1/6 SEM at RUSB) heard.  No friction rub. No gallop.   Pulmonary:     Effort: Pulmonary effort is normal. No respiratory distress.     Breath sounds: Normal breath sounds.  Chest:     Chest wall: No tenderness (Reproducible point tenderness along the left sternal border.).  Musculoskeletal:        General: Swelling (Trivial, difficult to tell with body habitus) present. Normal range of motion.     Cervical back: Normal range of motion and neck supple.  Skin:    General: Skin is warm and dry.  Neurological:     General: No focal deficit present.     Mental Status: She is alert and oriented to person, place, and time.     Adult ECG Report  Rate: 81 ;  Rhythm: normal sinus rhythm and Left atrial enlargement.  Cannot exclude anterior MI-age-indeterminate (poor R wave progression).  Normal axis, intervals durations.   Narrative Interpretation: *Stable EKG.  Recent Labs: Reviewed-no new labs No results found for: CHOL, HDL, LDLCALC, LDLDIRECT, TRIG, CHOLHDL Lab Results  Component Value Date   CREATININE 0.62 07/06/2020   BUN 13 07/06/2020   NA 139 07/06/2020   K 4.2 07/06/2020   CL 100 07/06/2020   CO2 26 07/06/2020   CBC Latest Ref Rng & Units 08/14/2019 04/28/2019 02/14/2019  WBC 4.0 - 10.5 K/uL 4.8 5.4 4.7  Hemoglobin 12.0 - 15.0 g/dL 12.8 12.8 11.6(L)  Hematocrit 36.0 - 46.0 % 41.9 41.1 38.7  Platelets 150 - 400 K/uL 289 233 256    No results found for: TSH  ==================================================  COVID-19 Education: The signs and symptoms of COVID-19 were discussed with the patient and how to seek care for testing (follow up with PCP or arrange  E-visit).   The importance of social distancing and COVID-19 vaccination was discussed today. The patient is practicing social distancing & Masking.   I spent a total of 44mnutes with the patient spent in direct patient consultation.  Additional time spent with chart review  / charting (studies, outside notes, etc): 14 min Total Time: 44 min   Current medicines are reviewed at length with the patient today.  (+/- concerns) N/A  This visit occurred during the SARS-CoV-2 public  health emergency.  Safety protocols were in place, including screening questions prior to the visit, additional usage of staff PPE, and extensive cleaning of exam room while observing appropriate contact time as indicated for disinfecting solutions.  Notice: This dictation was prepared with Dragon dictation along with smaller phrase technology. Any transcriptional errors that result from this process are unintentional and may not be corrected upon review.  Patient Instructions / Medication Changes & Studies & Tests Ordered   Patient Instructions  Medication Instructions:   contiue all other medication especially Diltiazem 240 mg  Diltiazem 60 mg  By mouth can take every 6 hours as need for palpitations up to twice a day with each episode.  *If you need a refill on your cardiac medications before your next appointment, please call your pharmacy*   Lab Work:  Not needed   Testing/Procedures: The Monitor will be mailed to your home  Your physician has recommended that you wear an  30  day event monitor. Event monitors are medical devices that record the heart's electrical activity. Doctors most often [place] these monitors to diagnose arrhythmias. Arrhythmias are problems with the speed or rhythm of the heartbeat. The monitor is a small, portable device. You can wear one while you do your normal daily activities. This is usually used to diagnose what is causing palpitations/syncope (passing out).  Follow-Up: At [facility], you and your health needs are our priority.  As part of our continuing mission to provide you with exceptional heart care, we have created designated Provider Care Teams.  These Care Teams include your primary Cardiologist (physician) and Advanced Practice Providers (APPs -  Physician Assistants and Nurse Practitioners) who all work together to provide you with the care you need, when you need it.   Your next appointment:    Keep appointment in March 23,2022  The format for your next appointment:   In Person  Provider:   Glenetta Hew, MD   Other Instructions  [employer] Cardiac Event Monitor Instructions Your physician has requested you wear your cardiac event monitor for _30____ days, (1-30). [employer] may call or text to confirm a shipping address. The monitor will be sent to a land address via UPS.  Studies Ordered:   Orders Placed This Encounter  Procedures  . CARDIAC EVENT MONITOR  . EKG 12-Lead     Glenetta Hew, M.D., M.S. Interventional Cardiologist   Pager # [phone] Phone # [phone] [address] [place], [place] [address]   Thank you for choosing Heartcare at [facility]!!

## 2020-12-31 NOTE — Patient Instructions (Addendum)
Medication Instructions:   contiue all other medication especially Diltiazem 240 mg  Diltiazem 60 mg  By mouth can take every 6 hours as need for palpitations up to twice a day with each episode.  *If you need a refill on your cardiac medications before your next appointment, please call your pharmacy*   Lab Work:  Not needed   Testing/Procedures: The Monitor will be mailed to your home  Your physician has recommended that you wear an  30  day event monitor. Event monitors are medical devices that record the heart's electrical activity. Doctors most often [place] these monitors to diagnose arrhythmias. Arrhythmias are problems with the speed or rhythm of the heartbeat. The monitor is a small, portable device. You can wear one while you do your normal daily activities. This is usually used to diagnose what is causing palpitations/syncope (passing out).     Follow-Up: At [facility], you and your health needs are our priority.  As part of our continuing mission to provide you with exceptional heart care, we have created designated Provider Care Teams.  These Care Teams include your primary Cardiologist (physician) and Advanced Practice Providers (APPs -  Physician Assistants and Nurse Practitioners) who all work together to provide you with the care you need, when you need it.     Your next appointment:    Keep appointment MArch 23,2022  The format for your next appointment:   In Person  Provider:   Bryan Lemma, MD   Other Instructions  [employer] Cardiac Event Monitor Instructions Your physician has requested you wear your cardiac event monitor for _30____ days, (1-30). [employer] may call or text to confirm a shipping address. The monitor will be sent to a land address via UPS. [employer] will not ship a monitor to a PO BOX. It typically takes 3-5 days to receive your monitor after it has been enrolled. [employer] will assist with USPS tracking if your package is delayed. The  telephone number for [employer] is [phone]. Once you have received your monitor, please review the enclosed instructions. Instruction tutorials can also be viewed under help and settings on the enclosed cell phone. Your monitor has already been registered assigning a specific monitor serial # to you.  Applying the monitor Remove cell phone from case and turn it on. The cell phone works as IT consultant and needs to be within [employer] of you at all times. The cell phone will need to be charged on a daily basis. We recommend you plug the cell phone into the enclosed charger at your bedside table every night.  Monitor batteries: You will receive two monitor batteries labelled #1 and #2. These are your recorders. Plug battery #2 onto the second connection on the enclosed charger. Keep one battery on the charger at all times. This will keep the monitor battery deactivated. It will also keep it fully charged for when you need to switch your monitor batteries. A small light will be blinking on the battery emblem when it is charging. The light on the battery emblem will remain on when the battery is fully charged.  Open package of a Monitor strip. Insert battery #1 into black hood on strip and gently squeeze monitor battery onto connection as indicated in instruction booklet. Set aside while preparing skin.  Choose location for your strip, vertical or horizontal, as indicated in the instruction booklet. Shave to remove all hair from location. There cannot be any lotions, oils, powders, or colognes on skin where monitor is  to be applied. Wipe skin clean with enclosed Saline wipe. Dry skin completely.  Peel paper labeled #1 off the back of the Monitor strip exposing the adhesive. Place the monitor on the chest in the vertical or horizontal position shown in the instruction booklet. One arrow on the monitor strip must be pointing upward. Carefully remove paper labeled #2, attaching remainder  of strip to your skin. Try not to create any folds or wrinkles in the strip as you apply it.  Firmly press and release the circle in the center of the monitor battery. You will hear a small beep. This is turning the monitor battery on. The heart emblem on the monitor battery will light up every 5 seconds if the monitor battery in turned on and connected to the patient securely. Do not push and hold the circle down as this turns the monitor battery off. The cell phone will locate the monitor battery. A screen will appear on the cell phone checking the connection of your monitor strip. This may read poor connection initially but change to good connection within the next minute. Once your monitor accepts the connection you will hear a series of 3 beeps followed by a climbing crescendo of beeps. A screen will appear on the cell phone showing the two monitor strip placement options. Touch the picture that demonstrates where you applied the monitor strip.  Your monitor strip and battery are waterproof. You are able to shower, bathe, or swim with the monitor on. They just ask you do not submerge deeper than 3 feet underwater. We recommend removing the monitor if you are swimming in a lake, river, or ocean.  Your monitor battery will need to be switched to a fully charged monitor battery approximately once a week. The cell phone will alert you of an action which needs to be made.  On the cell phone, tap for details to reveal connection status, monitor battery status, and cell phone battery status. The green dots indicates your monitor is in good status. A red dot indicates there is something that needs your attention.  To record a symptom, click the circle on the monitor battery. In 30-60 seconds a list of symptoms will appear on the cell phone. Select your symptom and tap save. Your monitor will record a sustained or significant arrhythmia regardless of you clicking the button. Some patients do not  feel the heart rhythm irregularities. [employer] will notify us of any serious or critical events.  Refer to instruction booklet for instructions on switching batteries, changing strips, the Do not disturb or Pause features, or any additional questions.  Call [employer] at [phone], to confirm your monitor is transmitting and record your baseline. They will answer any questions you may have regarding the monitor instructions at that time.  Returning the monitor to [employer] Place all equipment back into blue box. Peel off strip of paper to expose adhesive and close box securely. There is a prepaid UPS shipping label on this box. Drop in a UPS drop box, or at a UPS facility like [employer]. You may also contact [employer] to arrange UPS to pick up monitor package at your home.

## 2020-12-31 NOTE — Progress Notes (Signed)
Patient ID: Chelsea Benson, female   DOB: October 24, 1951, 70 y.o.   MRN: [id number] Patient enrolled for [employer] to ship a 30 day cardiac event monitor to his home.

## 2021-01-04 ENCOUNTER — Encounter: Payer: Self-pay | Admitting: Cardiology

## 2021-01-04 NOTE — Assessment & Plan Note (Signed)
Decent blood pressure.  Continue current dose of diltiazem along with HCTZ. Depression increase, with her being a diabetic, would probably benefit from ARB, however she has had issues with ARB use in the past.

## 2021-01-04 NOTE — Assessment & Plan Note (Signed)
She has cramping pain in her legs is not associate with exertion.  Probably related to muscle strain.  Does not sound like myalgias or myopathy from statin.  Recommended PRN mustard or vinegar

## 2021-01-04 NOTE — Assessment & Plan Note (Signed)
I suspect that some of her dyspnea, and edema chest pain from coughing is related to asthma.

## 2021-01-04 NOTE — Assessment & Plan Note (Signed)
She has intermittent episodes of chest pain-notable with deep inspiration, coughing and is reproducible with palpation.  Unlikely cardiac in nature.  Atypical symptoms most consistent with musculoskeletal chest pain.  Discussed using Tylenol or NSAIDs.

## 2021-01-04 NOTE — Assessment & Plan Note (Signed)
Controlled with diltiazem, but having breakthrough spells now every couple weeks.  I would like to see what it is were treating.  Need to see if this is A. fib versus SVT or simply just PACs/PVCs.  Plan:   30-day event monitor,   Continue current dose of diltiazem XT,  PRN short acting diltiazem 60 mg for breakthrough tachycardia

## 2021-01-04 NOTE — Assessment & Plan Note (Addendum)
Unfortunately, I do not have any recent labs.  She remains on pravastatin 20 mg daily.  Aspirin follow-up with PCP.  Should be due to be rechecked this summer.   Last available labs from May 2021 showed LDL of 86.  Relatively well controlled.

## 2021-01-05 ENCOUNTER — Encounter (INDEPENDENT_AMBULATORY_CARE_PROVIDER_SITE_OTHER): Payer: 59

## 2021-01-05 DIAGNOSIS — R002 Palpitations: Secondary | ICD-10-CM

## 2021-01-11 ENCOUNTER — Ambulatory Visit (INDEPENDENT_AMBULATORY_CARE_PROVIDER_SITE_OTHER): Payer: 59 | Admitting: Psychiatry

## 2021-01-11 DIAGNOSIS — F419 Anxiety disorder, unspecified: Secondary | ICD-10-CM | POA: Diagnosis not present

## 2021-01-11 NOTE — Progress Notes (Signed)
[facility] Counselor/Therapist Progress Note  Patient ID: Chelsea Benson, MRN: [id number],    Date: 01/11/2021  Time Spent: 50 minutes   8:50am to 9:40am  Virtual Visit Note via Copywriter, advertising with patient by a video enabled telemedicine/telehealth application or telephone, with their informed consent, and verified patient privacy and that I am speaking with the correct person using two identifiers. I discussed the limitations, risks, security and privacy concerns of performing psychotherapy and management service by telephone and the availability of in person appointments. I also discussed with the patient that there may be a patient responsible charge related to this service. The patient expressed understanding and agreed to proceed. I discussed the treatment planning with the patient. The patient was provided an opportunity to ask questions and all were answered. The patient agreed with the plan and demonstrated an understanding of the instructions. The patient was advised to call  our office if  symptoms worsen or feel they are in a crisis state and need immediate contact.   Therapist Location: [facility] Patient Location: home  Treatment Type: Individual Therapy  Reported Symptoms:  Anxiety is heightened especially due to some cardiac issues currently. Also brother-in-law in hospital critically ill in [place] state. Wants to be able to visit them "but can't right now as I'm wearing a heart monitor for 30 days."   Mental Status Exam:  Appearance:   Casual     Behavior:  Appropriate, Sharing and Motivated  Motor:  Normal  Speech/Language:   Clear and Coherent  Affect:  anxious  Mood:  anxious  Thought process:  goal directed  Thought content:    some obsessiveness  Sensory/Perceptual disturbances:    WNL  Orientation:  oriented to person, place, time/date, situation, day of week, month of year and year  Attention:  Good  Concentration:  Good and Fair   Memory:  occasional forgetting and worse under stress  Fund of knowledge:   Good  Insight:    Good and Fair  Judgment:   Good and Fair  Impulse Control:  Good   Risk Assessment: Danger to Self:  No Self-injurious Behavior: No Danger to Others: No Duty to Warn:no Physical Aggression / Violence:No  Access to Firearms a concern: No  Gang Involvement:No   Subjective:  Patient today reports anxiety mostly about her recent increased cardiac issues and is currently wearing a heart monitor and will for a month.  Tendency to assume the negative versus positive.  Interventions: Solution-Oriented/Positive Psychology and Ego-Supportive  Diagnosis:   ICD-10-CM   1. Anxiety disorder, unspecified type  F41.9     Plan: Patient not signing tx goals on computer screen due to COVID.  Treatment Goals:  Goals remain on tx plan as patient works on strategies to meet her goals. Progress will be documented each visit in "Progress" section on Plan.   Long term goal:(measurable) Reduce overall level, frequency, and intensity of the anxiety so that daily functioning is not impaired.* Patient will eventually report a rating of "4"or less" on 1-10 anxiety scale for a96-month time period where her daily functioning is not impaired.  Short term goal: Increase understanding of beliefs and messages that produce the worry and anxiety.  Strategy: Patient will explore and work to stop cognitive messages that feed anxiety and work to replace them with more positive and empowering messages.   PROGRESS: Patient today reporting anxiety mostly regarding her recent increased heart issues and is currently wearing a heart monitor for  30 days. Fearful of "what may happen" and tends to look at things going in negative direction versus any positives, even though her doctor has been more optimistic.  Discussed this with patient and how "I dont' know how to control it and I don't like things I can't control."   I "get angry and frustrated if I'm not in control and I've always been like that."  Discussed strategies for her to use when "things are out of her control", including some re-framing and focusing on her confidence in her doctor and use her faith as well to help her emotionally be more calm.  Patient actually worked with this some in session today and was able to help calm herself some, but admits it's harder to do on her own. Worked with her short term goal and strategy in her tx goal plan above on recognizing more quickly her anxious/negtive thoughts and self-talk and trying to interrupt them, replacing them with more reality-based and empowering thoughts and self-talk. Does admit sometimes that her fears often, if ever, turn out to be true. "I try to remember this so not to get so anxious but it's hard in the moment.".  Talking things out like this, she states is helpful to her and "I do try to learn to do things differently." Is also concerned about her sister who lives in [place] and her husband reportedly is quite ill in hospital, and she is feeling he won't survive. A lot of patient's conversation does revolve around negative, sad, difficult situations that are happening. Discussed some need to have more balance in life and be able to see and focus on some positives also, and when asked she was able to identify 3 positives in her life. Encouraged to hang on to the positives and accentuate them, get outside some each day and move about some as she is able, stay on meds as prescribed, focus on what she can control, stay in the present, practice re-framing anxious/negative/fearful thoughts as we worked on is session more today, and stay in contact with people who are supportive of her.   Goal review and progress/challenges noted with patient.  Next appt within 3 weeks.   Mathis Fare, LCSW

## 2021-01-14 ENCOUNTER — Encounter: Payer: Self-pay | Admitting: Psychiatry

## 2021-01-14 ENCOUNTER — Telehealth (INDEPENDENT_AMBULATORY_CARE_PROVIDER_SITE_OTHER): Payer: 59 | Admitting: Psychiatry

## 2021-01-14 DIAGNOSIS — F410 Panic disorder [episodic paroxysmal anxiety] without agoraphobia: Secondary | ICD-10-CM

## 2021-01-14 DIAGNOSIS — F5101 Primary insomnia: Secondary | ICD-10-CM

## 2021-01-14 DIAGNOSIS — F419 Anxiety disorder, unspecified: Secondary | ICD-10-CM | POA: Diagnosis not present

## 2021-01-14 MED ORDER — CLONAZEPAM 0.5 MG PO TABS: ORAL_TABLET | ORAL | 0 refills | Status: DC

## 2021-01-14 MED ORDER — OXCARBAZEPINE 300 MG PO TABS: 600.0000 mg | ORAL_TABLET | Freq: Every day | ORAL | 2 refills | Status: DC

## 2021-01-14 MED ORDER — BELSOMRA 20 MG PO TABS: 20.0000 mg | ORAL_TABLET | Freq: Every day | ORAL | 1 refills | Status: DC

## 2021-01-14 MED ORDER — BUSPIRONE HCL 30 MG PO TABS: 30.0000 mg | ORAL_TABLET | Freq: Two times a day (BID) | ORAL | 2 refills | Status: DC

## 2021-01-14 MED ORDER — HYDROXYZINE HCL 25 MG PO TABS: 25.0000 mg | ORAL_TABLET | Freq: Four times a day (QID) | ORAL | 0 refills | Status: DC | PRN

## 2021-01-14 MED ORDER — ZOLPIDEM TARTRATE 10 MG PO TABS
5.0000 mg | ORAL_TABLET | Freq: Every evening | ORAL | 1 refills | Status: DC | PRN
Start: 2021-02-08 — End: 2021-08-06

## 2021-01-14 NOTE — Progress Notes (Signed)
[name] [id number] 03-Oct-1951 70 y.o.  Virtual Visit via Video Note  I connected with pt @ on 01/14/21 at  9:30 AM EST by a video enabled telemedicine application and verified that I am speaking with the correct person using two identifiers.   I discussed the limitations of evaluation and management by telemedicine and the availability of in person appointments. The patient expressed understanding and agreed to proceed.  I discussed the assessment and treatment plan with the patient. The patient was provided an opportunity to ask questions and all were answered. The patient agreed with the plan and demonstrated an understanding of the instructions.   The patient was advised to call back or seek an in-person evaluation if the symptoms worsen or if the condition fails to improve as anticipated.  I provided 30 minutes of non-face-to-face time during this encounter.  The patient was located at home.  The provider was located at [place].   Thayer Headings, PMHNP   Subjective:   Patient ID:  Chelsea Benson is a 70 y.o. (DOB [birth year]) female.  Chief Complaint:  Chief Complaint  Patient presents with  . Anxiety  . Follow-up    H/o mood disturbance and insomnia    HPI Chelsea Benson presents for follow-up of anxiety, mood disturbance, and insomnia.  Wearing a heart monitor after having heart palpitations and tachycardia. Palpitations have improved with Diltiazem.    Sister's husband is dying from cancer. Sister is in [place], [place] and Roderick wishes she could be there for her sister.  Grandson went blind and vision has been restored but cannot drive at night. Her anxiety has been "through the roof." She reports that she has periods of anxiety "like a wave coming over me" and will start to cry uncontrollably and have panic s/s. Has had increased worry. "No depression at all." Sleeping well. Appetite has been good. Energy and motivation have been good. She is  keeping up with classes online. She reports concentration is ok unless she has panic attacks while studying. Reports that panic attacks are occurring a few times a week. Denies impulsive or risky behavior. Denies SI.   Uses Klonopin prn when she has higher anxiety. Not taking Klonopin prn daily. She reports that she has not used Hydroxyzine prn recently.   Continues to take Belsomra and Ambien 10 mg QHS and is unable to sleep if she takes less.   Past Psychiatric Medication Trials: Cymbalta-hair loss Mirtazapine Prozac Trazodone-ineffective Ambien BuSpar Hydroxyzine Abilify Trileptal Xanax  Review of Systems:  Review of Systems  Cardiovascular: Negative for chest pain and palpitations.  Endocrine:       She reports that she is checking glucose levels twice daily and glucose levels have been controlled.   Musculoskeletal: Positive for back pain and gait problem.       Has been having to use her rolator and cane.   Neurological: Negative for tremors.  Psychiatric/Behavioral:       Please refer to HPI    Medications: I have reviewed the patient's current medications.  Current Outpatient Medications  Medication Sig Dispense Refill  . albuterol (PROVENTIL) (2.5 MG/3ML) 0.083% nebulizer solution USE 1 VIAL VIA NEBULIZER EVERY 4 HRS AS NEEDED FOR WHEEZING OR SHORTNESS OF BREATH. MANUFACTURER RECOMMENDS NOT EXCEEDING 4 VIALS PER DAY 270 mL 1  . albuterol (VENTOLIN HFA) 108 (90 Base) MCG/ACT inhaler Inhale 2 puffs into the lungs every 4 (four) hours as needed. 54 g 1  . ARIPiprazole (ABILIFY) 2 MG  tablet TAKE 2 TABLETS BY MOUTH  DAILY 180 tablet 3  . aspirin EC 81 MG tablet Take 81 mg by mouth daily.    Marland Kitchen azelastine (ASTELIN) 0.1 % nasal spray Place 1 spray into both nostrils 2 (two) times daily. 90 mL 1  . CLOBETASOL PROPIONATE E 0.05 % emollient cream Apply 1 application topically 2 (two) times daily as needed (rash).     Marland Kitchen desonide (DESOWEN) 0.05 % ointment APPLY TO AFFECTED AREA(S)   TOPICALLY TWICE DAILY AS  NEEDED 180 g 1  . diltiazem (CARDIZEM) 60 MG tablet Take Diltiazem 60 mg by mouth as needed  for palpitations. You can take every 6 hours as need up to twice a day with each episode. 60 tablet 6  . famotidine (PEPCID) 20 MG tablet TAKE 1 TABLET BY MOUTH  DAILY AFTER SUPPER. 90 tablet 3  . fluticasone (FLONASE) 50 MCG/ACT nasal spray USE 2 SPRAYS IN BOTH  NOSTRILS DAILY 48 g 0  . hydrochlorothiazide (HYDRODIURIL) 25 MG tablet Take 25 mg by mouth daily.     Marland Kitchen levocetirizine (XYZAL) 5 MG tablet Take 1 tablet (5 mg total) by mouth daily as needed for allergies. 30 tablet 5  . meloxicam (MOBIC) 15 MG tablet Take 1 tablet (15 mg total) by mouth daily. 30 tablet 1  . Mepolizumab (NUCALA) 100 MG/ML SOAJ Inject 100 mg into the skin every 28 (twenty-eight) days. 0.84 mL 11  . metFORMIN (GLUCOPHAGE-XR) 500 MG 24 hr tablet Take 500 mg by mouth 1 day or 1 dose.     . mirtazapine (REMERON) 30 MG tablet TAKE 1 TABLET BY MOUTH AT  BEDTIME 90 tablet 3  . montelukast (SINGULAIR) 10 MG tablet TAKE 1 TABLET BY MOUTH AT  BEDTIME 90 tablet 3  . pantoprazole (PROTONIX) 40 MG tablet TAKE 1 TABLET BY MOUTH  DAILY. TAKE 30 TO 60  MINUTES BEFORE FIRST MEAL  OF THE DAY 90 tablet 3  . potassium chloride SA (K-DUR) 20 MEQ tablet Take 20 mEq by mouth daily.    . pravastatin (PRAVACHOL) 20 MG tablet Take 20 mg by mouth at bedtime.     . pregabalin (LYRICA) 100 MG capsule Take 100 mg by mouth 3 (three) times daily.    . Semaglutide, 1 MG/DOSE, (OZEMPIC, 1 MG/DOSE,) 2 MG/1.5ML SOPN Take 1 mg by mouth once a week.    . SYMBICORT 160-4.5 MCG/ACT inhaler INHALE 2 INHALATIONS BY  MOUTH INTO THE LUNGS IN THE MORNING AND AT BEDTIME 30.6 g 3  . tiZANidine (ZANAFLEX) 4 MG tablet Take 1 tablet (4 mg total) by mouth every 6 (six) hours as needed for muscle spasms. 30 tablet 0  . ACCU-CHEK GUIDE test strip TEST BID PRN  1  . Blood Glucose Monitoring Suppl (ACCU-CHEK GUIDE) w/Device KIT See admin instructions.  1   . busPIRone (BUSPAR) 30 MG tablet Take 1 tablet (30 mg total) by mouth 2 (two) times daily. 180 tablet 2  . clonazePAM (KLONOPIN) 0.5 MG tablet Take 1/2-1 tab po TID prn anxiety. (Do not combine with Ambien) 90 tablet 0  . diltiazem (CARDIZEM CD) 240 MG 24 hr capsule Take 1 capsule (240 mg total) by mouth daily. 90 capsule 3  . EPINEPHRINE 0.3 mg/0.3 mL IJ SOAJ injection INJECT CONTENTS OF 1 PEN AS NEEDED FOR ALLERGIC  RESPONSE AS DIRECTED BY MD. SEEK MEDICAL ATTENTION  AFTER USE. 4 each 1  . hydrOXYzine (ATARAX/VISTARIL) 25 MG tablet Take 1 tablet (25 mg total) by mouth every 6 (  six) hours as needed. 180 tablet 0  . Oxcarbazepine (TRILEPTAL) 300 MG tablet Take 2 tablets (600 mg total) by mouth at bedtime. 180 tablet 2  . PROAIR HFA 108 (90 Base) MCG/ACT inhaler Inhale 2 puffs into the lungs every 4 (four) hours as needed for wheezing or shortness of breath. 56 g 1  . Respiratory Therapy Supplies (NEBULIZER) DEVI Use as directed with nebulizer solution. 1 each 1  . Respiratory Therapy Supplies (NEBULIZER/TUBING/MOUTHPIECE) KIT Use as directed with nebulizer machine 1 kit 12  . Suvorexant (BELSOMRA) 20 MG TABS Take 20 mg by mouth at bedtime. 90 tablet 1  . [START ON 02/08/2021] zolpidem (AMBIEN) 10 MG tablet Take 0.5-1 tablets (5-10 mg total) by mouth at bedtime as needed. for sleep 90 tablet 1   No current facility-administered medications for this visit.    Medication Side Effects: None Denies involuntary movements. (none noted on video)  Allergies:  Allergies  Allergen Reactions  . Cymbalta [Duloxetine Hcl] Other (See Comments)    Caused hair loss  . Depo-Medrol [Methylprednisolone Acetate] Hives    05/29/20 developed hives "all over my back" after MBB; resolved with Benadryl and "a few days."  Can tolerate Dexamethasone/Decadron.  . Rosuvastatin Calcium Other (See Comments) and Cough    Rapid heart rate  . Almond Meal Itching and Swelling    Tongue swells and itches  . Almond Oil  Itching and Swelling    Itching and swelling of tongue   . Carvedilol Cough  . Crestor [Rosuvastatin] Cough  . Fish Allergy Itching    Halibut fish  . Losartan Potassium Cough  . Morphine And Related Itching    Past Medical History:  Diagnosis Date  . Allergic rhinoconjunctivitis   . Allergy history unknown   . Anxiety   . Arthritis    DDD, spondylosis  . Asthma   . Bipolar disorder ([place])   . Chronic back pain    Has had several surgeries.  He uses a walker  . Chronic kidney disease    renal calculi  . Depression   . Diabetes mellitus without complication ([place])    dx in 2007  . Diabetes mellitus, type II ([place])   . Eczema   . Headache(784.0)    sinus related   . History of kidney stones   . Hx of blood clots    lungs  . Hx of pulmonary embolus 2017  . Hyperlipidemia   . Hypertension   . Insomnia   . Knee pain   . Left ankle pain   . Memory change   . Mental disorder   . Mood disorder ([place])   . OSA on CPAP    PSG 05/24/2016: AHI 17/hour 02 mean 76%.  HSAT 06/13/17, AHI 11-hour 02 mean 79%  . Recurrent upper respiratory infection (URI)   . Sleep apnea   . Tremor of both hands     Family History  Problem Relation Age of Onset  . Diabetes Sister   . Diabetes Brother   . Hypertension Brother   . Cancer Maternal Grandmother   . Heart failure Maternal Grandmother   . Hypertension Paternal Grandmother   . Asthma Daughter   . Cancer Daughter   . Depression Daughter   . Hypertension Daughter   . Heart failure Maternal Aunt   . Pneumonia Mother   . Diabetes Mother   . Alcoholism Father   . Alcohol abuse Father   . Depression Daughter   . Schizophrenia Grandchild   .  Allergies Neg Hx   . Eczema Neg Hx   . Immunodeficiency Neg Hx     Social History   Socioeconomic History  . Marital status: Single    Spouse name: Not on file  . Number of children: 2  . Years of education: Not on file  . Highest education level: Some college, no degree  Occupational  History    Comment: NA  Tobacco Use  . Smoking status: Passive Smoke Exposure - Never Smoker  . Smokeless tobacco: Never Used  . Tobacco comment: grandson smokes, but in his car  Vaping Use  . Vaping Use: Never used  Substance and Sexual Activity  . Alcohol use: Yes    Comment: rare use  . Drug use: No  . Sexual activity: Not Currently  Other Topics Concern  . Not on file  Social History Narrative   She is currently single.  Has 2 girls.  Has some college education.  Currently disabled due to chronic back pain and not employed.   07/01/19 lives with brother Shanon Brow   Caffeine, none   Social Determinants of Health   Financial Resource Strain: Not on file  Food Insecurity: Not on file  Transportation Needs: Not on file  Physical Activity: Not on file  Stress: Not on file  Social Connections: Not on file  Intimate Partner Violence: Not on file    Past Medical History, Surgical history, Social history, and Family history were reviewed and updated as appropriate.   Please see review of systems for further details on the patient's review from today.   Objective:   Physical Exam:  There were no vitals taken for this visit.  Physical Exam Neurological:     Mental Status: She is alert and oriented to person, place, and time.     Cranial Nerves: No dysarthria.  Psychiatric:        Attention and Perception: Attention and perception normal.        Mood and Affect: Mood is anxious.        Speech: Speech normal.        Behavior: Behavior is cooperative.        Thought Content: Thought content normal. Thought content is not paranoid or delusional. Thought content does not include homicidal or suicidal ideation. Thought content does not include homicidal or suicidal plan.        Cognition and Memory: Cognition and memory normal.        Judgment: Judgment normal.     Comments: Insight intact     Lab Review:     Component Value Date/Time   NA 139 07/06/2020 0834   K 4.2  07/06/2020 0834   CL 100 07/06/2020 0834   CO2 26 07/06/2020 0834   GLUCOSE 277 (H) 07/06/2020 0834   GLUCOSE 120 (H) 08/14/2019 0859   BUN 13 07/06/2020 0834   CREATININE 0.62 07/06/2020 0834   CALCIUM 10.2 07/06/2020 0834   PROT 7.3 04/28/2019 1712   ALBUMIN 4.2 04/28/2019 1712   AST 21 04/28/2019 1712   ALT 30 04/28/2019 1712   ALKPHOS 68 04/28/2019 1712   BILITOT 0.5 04/28/2019 1712   GFRNONAA 94 07/06/2020 0834   GFRAA 108 07/06/2020 0834       Component Value Date/Time   WBC 4.8 08/14/2019 0859   RBC 4.86 08/14/2019 0859   HGB 12.8 08/14/2019 0859   HGB 12.5 11/08/2018 1041   HCT 41.9 08/14/2019 0859   HCT 39.5 11/08/2018 1041   PLT 289 08/14/2019  0859   MCV 86.2 08/14/2019 0859   MCV 81 11/08/2018 1041   MCH 26.3 08/14/2019 0859   MCHC 30.5 08/14/2019 0859   RDW 14.9 08/14/2019 0859   RDW 14.3 11/08/2018 1041   LYMPHSABS 2.1 04/28/2019 1712   LYMPHSABS 1.4 11/08/2018 1041   MONOABS 0.5 04/28/2019 1712   EOSABS 0.3 04/28/2019 1712   EOSABS 0.2 11/08/2018 1041   BASOSABS 0.0 04/28/2019 1712   BASOSABS 0.0 11/08/2018 1041    No results found for: POCLITH, LITHIUM   No results found for: PHENYTOIN, PHENOBARB, VALPROATE, CBMZ   .res Assessment: Plan:   Will continue current plan of care since target signs and symptoms are well controlled without any tolerability issues. Discussed that current anxiety is mostly situational and has been manageable with periodic use of Klonopin prn. Advised pt to contact office if cardiologist has any concerns about psychiatric medications contributing to recent palpitations/tachycardia.  Recommend continuing therapy with Rinaldo Cloud, LCSW.  Pt to follow-up in 3 months or sooner if clinically indicated.  Patient advised to contact office with any questions, adverse effects, or acute worsening in signs and symptoms.   Chelsea Benson was seen today for anxiety and follow-up.  Diagnoses and all orders for this visit:  Anxiety  disorder, unspecified type -     busPIRone (BUSPAR) 30 MG tablet; Take 1 tablet (30 mg total) by mouth 2 (two) times daily. -     hydrOXYzine (ATARAX/VISTARIL) 25 MG tablet; Take 1 tablet (25 mg total) by mouth every 6 (six) hours as needed. -     Oxcarbazepine (TRILEPTAL) 300 MG tablet; Take 2 tablets (600 mg total) by mouth at bedtime.  Panic disorder -     clonazePAM (KLONOPIN) 0.5 MG tablet; Take 1/2-1 tab po TID prn anxiety. (Do not combine with Ambien)  Primary insomnia -     Suvorexant (BELSOMRA) 20 MG TABS; Take 20 mg by mouth at bedtime. -     zolpidem (AMBIEN) 10 MG tablet; Take 0.5-1 tablets (5-10 mg total) by mouth at bedtime as needed. for sleep     Please see After Visit Summary for patient specific instructions.  Future Appointments  Date Time Provider [place]  01/15/2021 10:20 AM Kennith Gain, MD [facility] None  01/25/2021  9:00 AM Shanon Ace, LCSW [facility] None  02/08/2021  9:00 AM Shanon Ace, LCSW [facility] None  02/22/2021  9:00 AM Shanon Ace, LCSW [facility] None  02/24/2021  8:00 AM Leonie Man, MD [facility]  03/08/2021  9:00 AM Shanon Ace, LCSW [facility] None  03/22/2021  9:00 AM Shanon Ace, LCSW [facility] None  04/06/2021  9:30 AM Thayer Headings, PMHNP [facility] None    No orders of the defined types were placed in this encounter.     -------------------------------

## 2021-01-15 ENCOUNTER — Other Ambulatory Visit: Payer: Self-pay

## 2021-01-15 ENCOUNTER — Ambulatory Visit (INDEPENDENT_AMBULATORY_CARE_PROVIDER_SITE_OTHER): Payer: 59 | Admitting: Allergy

## 2021-01-15 ENCOUNTER — Encounter: Payer: Self-pay | Admitting: Allergy

## 2021-01-15 VITALS — BP 118/78 | HR 87 | Temp 98.5°F | Ht 64.0 in | Wt 176.6 lb

## 2021-01-15 DIAGNOSIS — J4540 Moderate persistent asthma, uncomplicated: Secondary | ICD-10-CM

## 2021-01-15 DIAGNOSIS — T7800XD Anaphylactic reaction due to unspecified food, subsequent encounter: Secondary | ICD-10-CM

## 2021-01-15 DIAGNOSIS — H1013 Acute atopic conjunctivitis, bilateral: Secondary | ICD-10-CM | POA: Diagnosis not present

## 2021-01-15 DIAGNOSIS — J3089 Other allergic rhinitis: Secondary | ICD-10-CM | POA: Diagnosis not present

## 2021-01-15 DIAGNOSIS — B9689 Other specified bacterial agents as the cause of diseases classified elsewhere: Secondary | ICD-10-CM

## 2021-01-15 DIAGNOSIS — H109 Unspecified conjunctivitis: Secondary | ICD-10-CM

## 2021-01-15 DIAGNOSIS — L2089 Other atopic dermatitis: Secondary | ICD-10-CM

## 2021-01-15 MED ORDER — FAMOTIDINE 20 MG PO TABS
ORAL_TABLET | ORAL | 3 refills | Status: DC
Start: 2021-01-15 — End: 2021-01-19

## 2021-01-15 MED ORDER — BUDESONIDE-FORMOTEROL FUMARATE 160-4.5 MCG/ACT IN AERO: INHALATION_SPRAY | RESPIRATORY_TRACT | 3 refills | Status: DC

## 2021-01-15 MED ORDER — POLYMYXIN B-TRIMETHOPRIM 10000-0.1 UNIT/ML-% OP SOLN: OPHTHALMIC | 0 refills | Status: DC

## 2021-01-15 MED ORDER — LEVOCETIRIZINE DIHYDROCHLORIDE 5 MG PO TABS: 5.0000 mg | ORAL_TABLET | Freq: Every day | ORAL | 5 refills | Status: DC | PRN

## 2021-01-15 MED ORDER — MONTELUKAST SODIUM 10 MG PO TABS
10.0000 mg | ORAL_TABLET | Freq: Every day | ORAL | 3 refills | Status: DC
Start: 2021-01-15 — End: 2021-01-19

## 2021-01-15 MED ORDER — AZELASTINE HCL 0.05 % OP SOLN: 1.0000 [drp] | Freq: Two times a day (BID) | OPHTHALMIC | 5 refills | Status: DC | PRN

## 2021-01-15 MED ORDER — AZELASTINE HCL 0.1 % NA SOLN
1.0000 | Freq: Two times a day (BID) | NASAL | 1 refills | Status: DC
Start: 2021-01-15 — End: 2021-01-19

## 2021-01-15 MED ORDER — FLUTICASONE PROPIONATE 50 MCG/ACT NA SUSP
NASAL | 0 refills | Status: DC
Start: 2021-01-15 — End: 2021-01-19

## 2021-01-15 MED ORDER — EPINEPHRINE 0.3 MG/0.3ML IJ SOAJ: INTRAMUSCULAR | 1 refills | Status: DC

## 2021-01-15 MED ORDER — ALBUTEROL SULFATE HFA 108 (90 BASE) MCG/ACT IN AERS
2.0000 | INHALATION_SPRAY | RESPIRATORY_TRACT | 1 refills | Status: DC | PRN
Start: 2021-01-15 — End: 2021-01-19

## 2021-01-15 NOTE — Progress Notes (Signed)
Follow-up Note  RE: Chelsea Benson MRN: [id number] DOB: 02-10-1951 Date of Office Visit: 01/15/2021   History of present illness: Chelsea Benson is a 70 y.o. female presenting today for follow-up of allergic rhinitis with conjunctivitis, asthma, food allergy, eczema and adverse drug event.  Her last visit was a telemedicine visit on 06/10/2020 by myself.  She has not had any surgeries or hospitalizations but she is currently wearing a heart monitor until March 2 due to palpitations. She states she has been out of her allergy medications about 1-1/2 weeks or so that includes her allergy-based eyedrop Optivar, Xyzal, Astelin.  She also has Flonase and Singulair.  She states she has been taking her Singulair nightly.  Since she has been out of her medications she has noted more matting of the eye as well as some goopiness and redness of the eye.  She states when she has used the Polytrim eyedrop in the past it has been very helpful and she believes more so than the Optivar. She states her asthma has been under good control.  She is using her Symbicort 2 puffs twice a day.  She states her albuterol use is "few and far between" and she does not quite recall the last time she needed to use it.  She has not required any ED or urgent care visits for her asthma.  She continues on monthly Nucala injections and her last injection was last week.  Her daughter provides her injections at home. She continues to avoid almonds and has not needed to use her epinephrine device. She states she was started on a new medication for her nerves she says has been helpful with itch.  She does have triamcinolone but states she has not needed to use this.   Review of systems: Review of Systems  Constitutional: Negative.   HENT: Negative.   Eyes:       See HPI  Respiratory: Negative.   Cardiovascular: Negative.   Gastrointestinal: Negative.   Musculoskeletal: Negative.   Skin: Negative.   Neurological: Negative.      All other systems negative unless noted above in HPI  Past medical/social/surgical/family history have been reviewed and are unchanged unless specifically indicated below.  No changes  Medication List: Current Outpatient Medications  Medication Sig Dispense Refill  . albuterol (PROVENTIL) (2.5 MG/3ML) 0.083% nebulizer solution USE 1 VIAL VIA NEBULIZER EVERY 4 HRS AS NEEDED FOR WHEEZING OR SHORTNESS OF BREATH. MANUFACTURER RECOMMENDS NOT EXCEEDING 4 VIALS PER DAY 270 mL 1  . ARIPiprazole (ABILIFY) 2 MG tablet TAKE 2 TABLETS BY MOUTH  DAILY 180 tablet 3  . aspirin EC 81 MG tablet Take 81 mg by mouth daily.    Marland Kitchen azelastine (OPTIVAR) 0.05 % ophthalmic solution Place 1 drop into both eyes 2 (two) times daily as needed (itchy, watery eyes). 6 mL 5  . Blood Glucose Monitoring Suppl (ACCU-CHEK GUIDE) w/Device KIT See admin instructions.  1  . busPIRone (BUSPAR) 30 MG tablet Take 1 tablet (30 mg total) by mouth 2 (two) times daily. 180 tablet 2  . CLOBETASOL PROPIONATE E 0.05 % emollient cream Apply 1 application topically 2 (two) times daily as needed (rash).     . clonazePAM (KLONOPIN) 0.5 MG tablet Take 1/2-1 tab po TID prn anxiety. (Do not combine with Ambien) 90 tablet 0  . desonide (DESOWEN) 0.05 % ointment APPLY TO AFFECTED AREA(S)  TOPICALLY TWICE DAILY AS  NEEDED 180 g 1  . hydrochlorothiazide (HYDRODIURIL) 25 MG tablet  Take 25 mg by mouth daily.     . hydrOXYzine (ATARAX/VISTARIL) 25 MG tablet Take 1 tablet (25 mg total) by mouth every 6 (six) hours as needed. 180 tablet 0  . Mepolizumab (NUCALA) 100 MG/ML SOAJ Inject 100 mg into the skin every 28 (twenty-eight) days. 0.84 mL 11  . metFORMIN (GLUCOPHAGE-XR) 500 MG 24 hr tablet Take 500 mg by mouth 1 day or 1 dose.     . mirtazapine (REMERON) 30 MG tablet TAKE 1 TABLET BY MOUTH AT  BEDTIME 90 tablet 3  . Oxcarbazepine (TRILEPTAL) 300 MG tablet Take 2 tablets (600 mg total) by mouth at bedtime. 180 tablet 2  . pantoprazole (PROTONIX) 40  MG tablet TAKE 1 TABLET BY MOUTH  DAILY. TAKE 30 TO 60  MINUTES BEFORE FIRST MEAL  OF THE DAY 90 tablet 3  . potassium chloride SA (K-DUR) 20 MEQ tablet Take 20 mEq by mouth daily.    . pravastatin (PRAVACHOL) 20 MG tablet Take 20 mg by mouth at bedtime.     Marland Kitchen PROAIR HFA 108 (90 Base) MCG/ACT inhaler Inhale 2 puffs into the lungs every 4 (four) hours as needed for wheezing or shortness of breath. 56 g 1  . Respiratory Therapy Supplies (NEBULIZER) DEVI Use as directed with nebulizer solution. 1 each 1  . Respiratory Therapy Supplies (NEBULIZER/TUBING/MOUTHPIECE) KIT Use as directed with nebulizer machine 1 kit 12  . Semaglutide, 1 MG/DOSE, (OZEMPIC, 1 MG/DOSE,) 2 MG/1.5ML SOPN Take 1 mg by mouth once a week.    . Suvorexant (BELSOMRA) 20 MG TABS Take 20 mg by mouth at bedtime. 90 tablet 1  . tiZANidine (ZANAFLEX) 4 MG tablet Take 1 tablet (4 mg total) by mouth every 6 (six) hours as needed for muscle spasms. 30 tablet 0  . trimethoprim-polymyxin b (POLYTRIM) ophthalmic solution 1 drop each eye every 3 hours for 7 days at then STOP 10 mL 0  . [START ON 02/08/2021] zolpidem (AMBIEN) 10 MG tablet Take 0.5-1 tablets (5-10 mg total) by mouth at bedtime as needed. for sleep 90 tablet 1  . ACCU-CHEK GUIDE test strip TEST BID PRN  1  . albuterol (VENTOLIN HFA) 108 (90 Base) MCG/ACT inhaler Inhale 2 puffs into the lungs every 4 (four) hours as needed. 54 g 1  . azelastine (ASTELIN) 0.1 % nasal spray Place 1 spray into both nostrils 2 (two) times daily. 90 mL 1  . budesonide-formoterol (SYMBICORT) 160-4.5 MCG/ACT inhaler INHALE 2 INHALATIONS BY  MOUTH INTO THE LUNGS IN THE MORNING AND AT BEDTIME 30.6 g 3  . diltiazem (CARDIZEM CD) 240 MG 24 hr capsule Take 1 capsule (240 mg total) by mouth daily. 90 capsule 3  . diltiazem (CARDIZEM) 60 MG tablet Take Diltiazem 60 mg by mouth as needed  for palpitations. You can take every 6 hours as need up to twice a day with each episode. 60 tablet 6  . EPINEPHrine 0.3 mg/0.3  mL IJ SOAJ injection INJECT CONTENTS OF 1 PEN AS NEEDED FOR ALLERGIC  RESPONSE AS DIRECTED BY MD. SEEK MEDICAL ATTENTION  AFTER USE. 4 each 1  . famotidine (PEPCID) 20 MG tablet TAKE 1 TABLET BY MOUTH  DAILY AFTER SUPPER. 90 tablet 3  . fluticasone (FLONASE) 50 MCG/ACT nasal spray USE 2 SPRAYS IN BOTH  NOSTRILS DAILY 48 g 0  . levocetirizine (XYZAL) 5 MG tablet Take 1 tablet (5 mg total) by mouth daily as needed for allergies. 30 tablet 5  . meloxicam (MOBIC) 15 MG tablet Take 1  tablet (15 mg total) by mouth daily. (Patient not taking: Reported on 01/15/2021) 30 tablet 1  . montelukast (SINGULAIR) 10 MG tablet Take 1 tablet (10 mg total) by mouth at bedtime. 90 tablet 3  . pregabalin (LYRICA) 100 MG capsule Take 100 mg by mouth 3 (three) times daily. (Patient not taking: Reported on 01/15/2021)     No current facility-administered medications for this visit.     Known medication allergies: Allergies  Allergen Reactions  . Cymbalta [Duloxetine Hcl] Other (See Comments)    Caused hair loss  . Depo-Medrol [Methylprednisolone Acetate] Hives    05/29/20 developed hives "all over my back" after MBB; resolved with Benadryl and "a few days."  Can tolerate Dexamethasone/Decadron.  . Rosuvastatin Calcium Other (See Comments) and Cough    Rapid heart rate  . Almond Meal Itching and Swelling    Tongue swells and itches  . Almond Oil Itching and Swelling    Itching and swelling of tongue   . Carvedilol Cough  . Crestor [Rosuvastatin] Cough  . Fish Allergy Itching    Halibut fish  . Losartan Potassium Cough  . Morphine And Related Itching     Physical examination: Blood pressure 118/78, pulse 87, temperature 98.5 F (36.9 C), height 5' 4" (1.626 m), weight 176 lb 9.6 oz (80.1 kg), SpO2 97 %.  General: Alert, interactive, in no acute distress. HEENT: PERRLA with scleral injection of the right eye greater than left, mild watering bilaterally, TMs pearly gray, turbinates minimally edematous  without discharge, post-pharynx non erythematous. Neck: Supple without lymphadenopathy. Lungs: Clear to auscultation without wheezing, rhonchi or rales. {no increased work of breathing. CV: Normal S1, S2 without murmurs. Abdomen: Nondistended, nontender. Skin: Warm and dry, without lesions or rashes. Extremities:  No clubbing, cyanosis or edema. Neuro:   Grossly intact.  Diagnositics/Labs: None today  Assessment and plan:   Conjunctivitis -use polytrim eye drop 1 drop each eye every 3 hours for 7 days at then stop  Allergic rhinitis with conjunctivitis - Continue Flonase 2 sprays daily as needed.  Use flonase for 1-2 weeks at a time before stopping once symptoms improve. - Continue Astelin nasal spray 2 sprays in each nostril twice a day as needed for nasal drainage/post-nasal drip - Continue saline nasal rinses once a day for nasal symptoms. Use before nasal sprays  -Continue Xyzal 5 mg at this time (may take additional dose if needed to control allergy symptoms) - Continue Singulair 10 mg at bedtime -Continue Optivar 1 drop in each twice a day as needed for watery, itchy, red eyes - Continue allergen avoidance measures   Moderate persistent asthma - Continue Symbicort 160 - 2 puffs twice daily - Continue singulair as above  - have access to albuterol inhaler 2 puffs every 4-6 hours as needed for cough/wheeze/shortness of breath/chest tightness.  May use 15-20 minutes prior to activity.   Monitor frequency of use.   -Continue Nucala monthly autoinjector to allow for ease of home administration.  Daugther is giving her injections once a month.    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 - Carry EpiPen at all times.   Flexural atopic dermatitis - Continue moisturizing routine with Aveeno - Use triamcinolone 0.1% below the face.   Adverse effect of drug  -Development of hives following injection of Depo-Medrol to the back. I have advised that she avoid this particular  steroid in the future. It is uncommon that 1 will have reaction to a different steroid basis. As mentioned  she has tolerated cortisone and Kenalog injections in the past however they have not been effective for her back pain management. She also tolerates prednisone also without any issue. Thus if there are any other steroid bases that are used as injections then will recommend she try a different option.  Follow up in 4-6 or sooner if needed.   I appreciate the opportunity to take part in Chelsea Benson's care. Please do not hesitate to contact me with questions.  Sincerely,   Prudy Feeler, MD Allergy/Immunology [facility]

## 2021-01-15 NOTE — Patient Instructions (Addendum)
Conjunctivitis -use polytrim eye drop 1 drop each eye every 3 hours for 7 days at then stop  Allergic rhinitis with conjunctivitis - Continue Flonase 2 sprays daily as needed.  Use flonase for 1-2 weeks at a time before stopping once symptoms improve. - Continue Astelin nasal spray 2 sprays in each nostril twice a day as needed for nasal drainage/post-nasal drip - Continue saline nasal rinses once a day for nasal symptoms. Use before nasal sprays  -Continue Xyzal 5 mg at this time (may take additional dose if needed to control allergy symptoms) - Continue Singulair 10 mg at bedtime -Continue Optivar 1 drop in each twice a day as needed for watery, itchy, red eyes - Continue allergen avoidance measures   Moderate persistent asthma - Continue Symbicort 160 - 2 puffs twice daily - Continue singulair as above  - have access to albuterol inhaler 2 puffs every 4-6 hours as needed for cough/wheeze/shortness of breath/chest tightness.  May use 15-20 minutes prior to activity.   Monitor frequency of use.   -Continue Nucala monthly autoinjector to allow for ease of home administration.  Daugther is giving her injections once a month.    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 - Carry EpiPen at all times.   Flexural atopic dermatitis - Continue moisturizing routine with Aveeno - Use triamcinolone 0.1% below the face.   Adverse effect of drug  -Development of hives following injection of Depo-Medrol to the back. I have advised that she avoid this particular steroid in the future. It is uncommon that 1 will have reaction to a different steroid basis. As mentioned she has tolerated cortisone and Kenalog injections in the past however they have not been effective for her back pain management. She also tolerates prednisone also without any issue. Thus if there are any other steroid bases that are used as injections then will recommend she try a different option.  Follow up in 4-6 or sooner if  needed.

## 2021-01-19 ENCOUNTER — Telehealth: Payer: Self-pay | Admitting: Allergy

## 2021-01-19 DIAGNOSIS — J4540 Moderate persistent asthma, uncomplicated: Secondary | ICD-10-CM

## 2021-01-19 DIAGNOSIS — T7800XD Anaphylactic reaction due to unspecified food, subsequent encounter: Secondary | ICD-10-CM

## 2021-01-19 MED ORDER — AZELASTINE HCL 0.05 % OP SOLN: 1.0000 [drp] | Freq: Two times a day (BID) | OPHTHALMIC | 5 refills | Status: DC | PRN

## 2021-01-19 MED ORDER — LEVOCETIRIZINE DIHYDROCHLORIDE 5 MG PO TABS: 5.0000 mg | ORAL_TABLET | Freq: Every day | ORAL | 5 refills | Status: DC | PRN

## 2021-01-19 MED ORDER — ALBUTEROL SULFATE (2.5 MG/3ML) 0.083% IN NEBU: INHALATION_SOLUTION | RESPIRATORY_TRACT | 1 refills | Status: DC

## 2021-01-19 MED ORDER — AZELASTINE HCL 0.1 % NA SOLN: 1.0000 | Freq: Two times a day (BID) | NASAL | 1 refills | Status: DC

## 2021-01-19 MED ORDER — EPINEPHRINE 0.3 MG/0.3ML IJ SOAJ: INTRAMUSCULAR | 1 refills | Status: DC

## 2021-01-19 MED ORDER — MONTELUKAST SODIUM 10 MG PO TABS: 10.0000 mg | ORAL_TABLET | Freq: Every day | ORAL | 3 refills | Status: DC

## 2021-01-19 MED ORDER — BUDESONIDE-FORMOTEROL FUMARATE 160-4.5 MCG/ACT IN AERO: INHALATION_SPRAY | RESPIRATORY_TRACT | 3 refills | Status: DC

## 2021-01-19 MED ORDER — FLUTICASONE PROPIONATE 50 MCG/ACT NA SUSP: NASAL | 0 refills | Status: DC

## 2021-01-19 MED ORDER — ALBUTEROL SULFATE HFA 108 (90 BASE) MCG/ACT IN AERS: 2.0000 | INHALATION_SPRAY | RESPIRATORY_TRACT | 1 refills | Status: DC | PRN

## 2021-01-19 MED ORDER — FAMOTIDINE 20 MG PO TABS: ORAL_TABLET | ORAL | 3 refills | Status: DC

## 2021-01-19 NOTE — Telephone Encounter (Signed)
Patient called and needs to have all meds sent to [facility].Marland Kitchen asteln, optivar, symbicort, proventil, proair, epi-pen, singulair, xyzal, flonase, famotidine. [phone]

## 2021-01-19 NOTE — Telephone Encounter (Signed)
Patient called to have medications refilled from her last appointment 01/15/21. All medications were sent to [facility].

## 2021-01-21 ENCOUNTER — Other Ambulatory Visit: Payer: Self-pay

## 2021-01-21 MED ORDER — ALBUTEROL SULFATE HFA 108 (90 BASE) MCG/ACT IN AERS: 2.0000 | INHALATION_SPRAY | RESPIRATORY_TRACT | 0 refills | Status: DC | PRN

## 2021-01-25 ENCOUNTER — Ambulatory Visit (INDEPENDENT_AMBULATORY_CARE_PROVIDER_SITE_OTHER): Payer: 59 | Admitting: Psychiatry

## 2021-01-25 DIAGNOSIS — F419 Anxiety disorder, unspecified: Secondary | ICD-10-CM

## 2021-01-25 NOTE — Progress Notes (Signed)
[facility] Counselor/Therapist Progress Note  Patient ID: Chelsea Benson, MRN: [id number],    Date: 01/25/2021  Time Spent:  50 minutes   8:45am to 9:35am  Virtual Visit Note via MyChart Video Connected with patient by a video enabled telemedicine/telehealth application or telephone, with their informed consent, and verified patient privacy and that I am speaking with the correct person using two identifiers. I discussed the limitations, risks, security and privacy concerns of performing psychotherapy and management service by telephone and the availability of in person appointments. I also discussed with the patient that there may be a patient responsible charge related to this service. The patient expressed understanding and agreed to proceed. I discussed the treatment planning with the patient. The patient was provided an opportunity to ask questions and all were answered. The patient agreed with the plan and demonstrated an understanding of the instructions. The patient was advised to call  our office if  symptoms worsen or feel they are in a crisis state and need immediate contact.   Therapist Location: [facility] Patient Location: home   Treatment Type: Individual Therapy  Reported Symptoms: anxiety   Mental Status Exam:  Appearance:   Casual     Behavior:  Appropriate, Sharing and Motivated  Motor:  Normal  Speech/Language:   Clear and Coherent  Affect:  anxious  Mood:  anxious  Thought process:  goal directed  Thought content:    some ruminating  Sensory/Perceptual disturbances:    WNL  Orientation:  oriented to person, place, time/date, situation, day of week and month of year, year  Attention:  Good  Concentration:  Good and Fair  Memory:  sometimes "forget a word"  Fund of knowledge:   Good  Insight:    Good and Fair  Judgment:   Good  Impulse Control:  Good and Fair   Risk Assessment: Danger to Self:  No Self-injurious Behavior: No Danger  to Others: No Duty to Warn:no Physical Aggression / Violence:No  Access to Firearms a concern: No  Gang Involvement:No   Subjective: Patient today reports anxiety "some times worse than others".  "I don't trust others as much because of bad things happening so much now."  Interventions: Solution-Oriented/Positive Psychology and Ego-Supportive  Diagnosis:   ICD-10-CM   1. Anxiety disorder, unspecified type  F41.9     Plan: Patient not signing tx goals on computer screen due to COVID.  Treatment Goals:  Goals remain on tx plan as patient works on strategies to meet her goals. Progress will be documented each visit in "Progress" section on Plan.   Long term goal:(measurable) Reduce overall level, frequency, and intensity of the anxiety so that daily functioning is not impaired.* Patient will eventually report a rating of "4"or less" on 1-10 anxiety scale for a80-month time period where her daily functioning is not impaired.  Short term goal: Increase understanding of beliefs and messages that produce the worry and anxiety.  Strategy: Patient will explore and work to stop cognitive messages that feed anxiety and work to replace them with more positive and empowering messages.   PROGRESS: Patient today reporting" anxiety sometimes worse than others."  Difficult for patient to hand onto more encouraging cognitive messages. Still wearing her heart monitor and to take it back to be checked for result on March 27th. Wearing the monitor and "getting used to it and not having any usual symptoms so far". That has helped her be some calmer at times.  Shares that she's  starting to set some limits with family members that lean on her a lot. Some very recent improvement in not looking for things to go into a negative direction versus positive.  Difficulty with the fears and assuming the negative when "it's things out of my control" and that is hard to accept. Worked with her short term  goal and strategy in tx plan above, to try and strengthen some of the more recent progress in not feeling as anxious so often even when some of the stressors are out of her control. Used some examples of her more frequent anxious thoughts/assumptions, interrupting them and replacing with more realistic and encouraging thoughts that do not support anxiety and stress. Reviewed today some of the recent work work with patient on setting healthy boundaries.  She has started to use them more recently but we discussed today to emphasize and encourage her continued setting of boundaries with family and others as needed. Encouraged patient to practice the healthy boundaries more consistently in everyday life, to stay in contact with others who are supportive of her, to get outside some daily, stay on her meds as prescribed, challenge and practice re-framing her anxious/fearful thoughts more, try to stay in the present versus "worrying about what may happen in future".  Goal review and progress/challenges noted with patient.  Next appt within 3 weeks.   Mathis Fare, LCSW

## 2021-02-01 ENCOUNTER — Telehealth: Payer: Self-pay

## 2021-02-01 NOTE — Telephone Encounter (Signed)
Patient was informed she would have to have [facility] to refill her Restasis prescription since that's who originally prescribed it.

## 2021-02-01 NOTE — Telephone Encounter (Signed)
Patient called to request a refill on Restasis. She states her Eye Provider @ [facility] has sent this in for her before and she is requesting our office to take over sending the refills in.  Patient uses [facility].

## 2021-02-02 ENCOUNTER — Telehealth: Payer: Self-pay

## 2021-02-02 MED ORDER — DILTIAZEM HCL ER COATED BEADS 240 MG PO CP24: 240.0000 mg | ORAL_CAPSULE | Freq: Every day | ORAL | 3 refills | Status: DC

## 2021-02-02 MED ORDER — DILTIAZEM HCL ER COATED BEADS 300 MG PO CP24: 300.0000 mg | ORAL_CAPSULE | Freq: Every day | ORAL | 6 refills | Status: DC

## 2021-02-02 MED ORDER — APIXABAN 5 MG PO TABS: 5.0000 mg | ORAL_TABLET | Freq: Two times a day (BID) | ORAL | 11 refills | Status: DC

## 2021-02-02 NOTE — Telephone Encounter (Cosign Needed Addendum)
PER DR HARDING ,  INCREASE DILTIAZEM TO 300 MG DAILY . PATIENT IS AWARE.  PRESCRIPTION E-SENT .  PER DR HARDING ,GO HEAD AN TURN IN MONITOR. PATIENT VERBALIZED UNDERSTANDING

## 2021-02-02 NOTE — Telephone Encounter (Signed)
Received [employer] monitoring strip that on 2/28 at 11:27pm patient went into a-fib. Called patient. Patient was asleep during event. She is not currently feeling any palpitations or rapid HR. Vitals are 94, 165/93. Patient is fatigued but no other symptoms. Spoke with Dr. Herbie Baltimore. Patient to start Eliquis 5mg  BID. 30 day card put at front desk for pickup. Patient is taking 240mg  of Cardizem CD along with an as needed 60mg  of Cardizem. She has not taken the PRN dose. Will route to primary nurse.

## 2021-02-03 ENCOUNTER — Other Ambulatory Visit: Payer: Self-pay | Admitting: Allergy

## 2021-02-08 ENCOUNTER — Ambulatory Visit (INDEPENDENT_AMBULATORY_CARE_PROVIDER_SITE_OTHER): Payer: 59 | Admitting: Psychiatry

## 2021-02-08 ENCOUNTER — Other Ambulatory Visit: Payer: Self-pay

## 2021-02-08 DIAGNOSIS — F419 Anxiety disorder, unspecified: Secondary | ICD-10-CM | POA: Diagnosis not present
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
## 2021-02-12 ENCOUNTER — Telehealth: Payer: Self-pay | Admitting: Allergy

## 2021-02-12 NOTE — Telephone Encounter (Signed)
If she is able to take a decongestant like Mucinex D, I would recommend that at this time to help with the congestion, drainage and pressure.  Would only take Mucinex D or any decongestant for no longer than 3 days or so at a time.  Hopefully she will have her medications in that timeframe. It appears she is symptomatic currently because she does not have her routine medications as they have not been delivered yet?  Does she know how much longer able to take for her medications to be delivered to her?

## 2021-02-12 NOTE — Telephone Encounter (Signed)
Patient states she woke up with a lot of drainage and green/yellow mucus. Patient states she has not taking anything because she is unsure what to do. Patient states it took her other medications 7 days to be filled so she thinks she has a sinus infection now. Patient cannot come in for a visit because she has to let transportation know 3 days in advance.   Uses [facility] on [address].  Please advise.

## 2021-02-12 NOTE — Telephone Encounter (Signed)
Patient would like to know if there is anything she can take for her symptoms. Please advice

## 2021-02-12 NOTE — Telephone Encounter (Signed)
Patient is concerned of the yellow mucus coming from her nose and mouth. Patient agreed to take the mucinex and call back on Monday if no improvement. Patient expressed that she believes she has an infection due to the expended time with no medication. Patient stated that she has gotten her medication delivered to her today.

## 2021-02-16 ENCOUNTER — Other Ambulatory Visit: Payer: Self-pay | Admitting: Allergy

## 2021-02-18 ENCOUNTER — Other Ambulatory Visit: Payer: Self-pay | Admitting: Allergy

## 2021-02-18 DIAGNOSIS — J4540 Moderate persistent asthma, uncomplicated: Secondary | ICD-10-CM

## 2021-02-21 ENCOUNTER — Other Ambulatory Visit: Payer: Self-pay | Admitting: Allergy

## 2021-02-21 DIAGNOSIS — J4540 Moderate persistent asthma, uncomplicated: Secondary | ICD-10-CM

## 2021-02-22 ENCOUNTER — Ambulatory Visit (INDEPENDENT_AMBULATORY_CARE_PROVIDER_SITE_OTHER): Payer: 59 | Admitting: Psychiatry

## 2021-02-22 DIAGNOSIS — F419 Anxiety disorder, unspecified: Secondary | ICD-10-CM | POA: Diagnosis not present

## 2021-02-22 NOTE — Progress Notes (Signed)
[facility] Counselor/Therapist Progress Note  Patient ID: Chelsea Benson, MRN: [id number],    Date: 02/22/2021  Time Spent: 50 minutes   9:00am to 9:50am   Virtual Visit Note via MyChart Video: Connected with patient by a video enabled telemedicine/telehealth application or telephone, with their informed consent, and verified patient privacy and that I am speaking with the correct person using two identifiers. I discussed the limitations, risks, security and privacy concerns of performing psychotherapy and management service by telephone and the availability of in person appointments. I also discussed with the patient that there may be a patient responsible charge related to this service. The patient expressed understanding and agreed to proceed. I discussed the treatment planning with the patient. The patient was provided an opportunity to ask questions and all were answered. The patient agreed with the plan and demonstrated an understanding of the instructions. The patient was advised to call  our office if  symptoms worsen or feel they are in a crisis state and need immediate contact.   Therapist Location: [facility] Patient Location: home    Treatment Type: Individual Therapy  Reported Symptoms: anxiety, some depression  Mental Status Exam:  Appearance:   Casual     Behavior:  Appropriate, Sharing and Motivated  Motor:  uses a cane or rollater when mobile  Speech/Language:   Normal Rate  Affect:  anxious, some depression  Mood:  anxious and depressed  Thought process:  goal directed  Thought content:    some ruminating  Sensory/Perceptual disturbances:    WNL  Orientation:  oriented to person, place, time/date, situation, day of week, month of year and year  Attention:  Good  Concentration:  Good and Fair  Memory:  I forget things sometimes especially under stress  Fund of knowledge:   Good  Insight:    Good and Fair  Judgment:   Good  Impulse  Control:  Good   Risk Assessment: Danger to Self:  No Self-injurious Behavior: No Danger to Others: No Duty to Warn:no Physical Aggression / Violence:No  Access to Firearms a concern: No  Gang Involvement:No   Subjective: Patient reports anxiety and some depression "especially after my sister's husband died last week."   Interventions: Solution-Oriented/Positive Psychology and Ego-Supportive  Diagnosis:   ICD-10-CM   1. Anxiety disorder, unspecified type  F41.9     Plan: Patient not signing tx goals on computer screen due to COVID.  Treatment Goals:  Goals remain on tx plan as patient works on strategies to meet her goals. Progress will be documented each visit in "Progress" section on Plan.   Long term goal:(measurable) Reduce overall level, frequency, and intensity of the anxiety so that daily functioning is not impaired.* Patient will eventually report a rating of "4"or less" on 1-10 anxiety scale for a49-month time period where her daily functioning is not impaired.  Short term goal: Increase understanding of beliefs and messages that produce the worry and anxiety.  Strategy: Patient will explore and work to stop cognitive messages that feed anxiety and work to replace them with more positive and empowering messages.   PROGRESS: Patient today reporting some depression and anxiety, with "anxiety being my main symptom".  Shares sadness she is feeling due to death of sister's husband last week. Also talks at length about her health conditions, more specifically her heart issues with A-fib. (/cardiologist Dr. Herbie [place]). Adds that she now is taking a blood thinner Eliquist. Brother continues to create issues at home and  patient reports ongoing talk with him about her concerns; does not let him cook after a certain time at night so as to prevent him cooking and falling asleep again, plus patient remains up while he's cooking. Worked more today on healthy boundaries and  keeping them as needed with others.  Also reports trying to set better limits within family when others repeatedly ask her for help.  Working to not assume the negative in situations "but it's hard because I've tended to do that for a long time."  Reviewed short-term goal and strategy and treatment plan above regarding some of the work we do and focusing on her anxious/negative thoughts.  Encouraged patient to try to be outside some daily as she is able, continue working on reducing her anxious/negative assumptions and replacing them with more reality based thoughts that are positive, staying in touch with people that are supportive of her, staying on her meds as prescribed, and realizing the strength that she is showing as she tries to move forward and difficult and challenging circumstances/situations.   Goal review and progress/challenges noted with patient.  Next appt within 2-3 weeks.   Mathis Fare, LCSW

## 2021-02-24 ENCOUNTER — Ambulatory Visit (INDEPENDENT_AMBULATORY_CARE_PROVIDER_SITE_OTHER): Payer: 59 | Admitting: Cardiology

## 2021-02-24 ENCOUNTER — Other Ambulatory Visit: Payer: Self-pay

## 2021-02-24 ENCOUNTER — Encounter: Payer: Self-pay | Admitting: Cardiology

## 2021-02-24 VITALS — BP 108/70 | HR 89 | Ht 64.75 in | Wt 177.8 lb

## 2021-02-24 DIAGNOSIS — R002 Palpitations: Secondary | ICD-10-CM | POA: Diagnosis not present

## 2021-02-24 DIAGNOSIS — I10 Essential (primary) hypertension: Secondary | ICD-10-CM | POA: Diagnosis not present

## 2021-02-24 DIAGNOSIS — I480 Paroxysmal atrial fibrillation: Secondary | ICD-10-CM | POA: Diagnosis not present

## 2021-02-24 DIAGNOSIS — E785 Hyperlipidemia, unspecified: Secondary | ICD-10-CM | POA: Diagnosis not present

## 2021-02-24 DIAGNOSIS — R071 Chest pain on breathing: Secondary | ICD-10-CM

## 2021-02-24 HISTORY — DX: Paroxysmal atrial fibrillation: I48.0

## 2021-02-24 NOTE — Assessment & Plan Note (Signed)
Still has reproducible chest pain on exam.  Essentially normal coronary CTA.

## 2021-02-24 NOTE — Progress Notes (Signed)
Primary Care Provider: Marda Stalker, PA-C Cardiologist: Glenetta Hew, MD Electrophysiologist: None  Clinic Note: Chief Complaint  Patient presents with  . Atrial Fibrillation    Seen on event monitor, already started on Zetia with increased dose of diltiazem.    ===================================  ASSESSMENT/PLAN   Problem List Items Addressed This Visit    Paroxysmal atrial fibrillation ([place]) - Primary    During the entire month-long event monitor, only 4 minutes of A. fib noted.  Otherwise rate pretty well controlled with diltiazem-with increased dose of 31 300 mg.. She has already been started on Eliquis. She has Diltiazem short-acting 60 mg tablets.  We discussed the concept of taking the short acting medication for breakthrough tachycardia spells..  This patients CHA2DS2-VASc Score and unadjusted Ischemic Stroke Rate (% per year) is equal to 4.8 % stroke rate/year from a score of 4  Above score calculated as 1 point each if present [CHF, HTN, DM, Vascular=MI/PAD/Aortic Plaque, Age if 65-74, or Female] Above score calculated as 2 points each if present [Age > 75, or Stroke/TIA/TE]      Relevant Orders   EKG 12-Lead (Completed)   Hyperlipidemia with target LDL less than 100 (Chronic)    Well-controlled on pravastatin.      Essential hypertension (Chronic)    Blood pressure looks pretty well controlled .  Continue diltiazem and HCTZ.      Relevant Orders   EKG 12-Lead (Completed)   Pain of anterior chest wall with respiration (Chronic)    Still has reproducible chest pain on exam.  Essentially normal coronary CTA.      Rapid palpitations (Chronic)   Relevant Orders   EKG 12-Lead (Completed)      ===================================  HPI:    Chelsea Benson is a 70 y.o. female with a PMH notable for HTN, HLD and asthma along with bipolar disorder who presents today for 53-monthFollow-Up of Palpitations recently diagnosed as PAROXYSMAL ATRIAL  FIBRILLATION.  Chelsea PENDRYinitially seen seen on June 17, 2020 for chest discomfort and one episode of tachycardia -> at the request of CMarda Stalker PUtah -->We ordered a coronary CTA (essentially normal); she also started using herEMAYmonitor on her phone([place];  => in September follow-up with increase diltiazem to 240 mg daily for blood pressure control and palpitations, and discussed wearing a true event monitor if symptoms became persistent enough.  Chelsea FREEBURGwas last seen on December 31, 2020 with multipl complaints: Rapid heart rate spells lasting 15 to 20 minutes occurring 2-3 times a month.  The previous Saturday she had an episode where heart rate 12/05/1951.  In December there is an episode lasting 20 minutes with heart rate of 164.  One was read as tachycardia and the other was read as possible atrial fibrillation.  She also noted left calf cramping at night.  And then a dull aching sensation in the center of her chest worse with coughing.  She does note seasonal allergies.  Recent Hospitalizations: None  Reviewed  CV studies:    The following studies were reviewed today: (if available, images/films reviewed: From Epic Chart or Care Everywhere) . Event Monitor February 2022: Mostly sinus rhythm, minimum heart rate 68 bpm, maximum 127 bpm..  Average heart rate 88 bpm.  Rare PACs and PVCs  (<1%).  There is one brief spell that appears to possibly be Atrial Fibrillation but only 4-1/2 minutes.  Rate ranges from 91 to 124 bpm from 10:56 PM to 10:59  PM.  Symptoms not noted.  Actually no symptoms on the monitor.  Interval History:   Chelsea Benson returns today to hear the results of her study.  She states that she had an episode this past weekend where she felt heaviness in her chest lasting all night long.  Nothing really made it better or worse.  However after she got up and went going the next day she has not had any further symptoms.   She was started on Eliquis on what first Monday received a call from the monitor service indicating that she had had an episode of A. fib during sleep.  I also recommend increasing her diltiazem to 300.  She has not had any issues with taking Eliquis, just was not sure what was all about.  No bleeding issues.  She had not had another episode of tachycardia until yesterday when she had a spell that she felt lasted a long time but was probably was less than an a couple hours..  She took her diltiazem and it went away.    She really denies any chest discomfort or significant dyspnea/dizziness when she feels the A. fib, she just is scared because her heart rate is going fast and this comes out of nowhere.  Interestingly, she did not know the episode that occurred on the monitor.  She says that her heart rate was "low" this past weekend and she indicated this was 70 to 75 bpm (I explained to her that this is in the normal range).  She has some mild and today swelling that goes away when she puts her feet up. She has issues with balance with back and joint pains, uses a cane or rolling walker.  It usually is her back that limits her most.  CV Review of Symptoms (Summary): positive for - edema, irregular heartbeat, palpitations, rapid heart rate and She describes occasional chest heaviness that may or may not have to do with her palpitations. negative for - orthopnea, paroxysmal nocturnal dyspnea, shortness of breath or Lightheadedness or dizziness, syncope/near syncope or TIA/amaurosis fugax, claudication   The patient Does Not have symptoms concerning for COVID-19 infection (fever, chills, cough, or new shortness of breath).   REVIEWED OF SYSTEMS   Review of Systems  HENT: Positive for congestion.   Respiratory: Positive for cough (From postnasal drip and congestion). Negative for shortness of breath.   Cardiovascular: Positive for chest pain (Fleeting episodes of chest tightness that are not  associated with exertion.), palpitations and leg swelling (Mild).       Per HPI  Gastrointestinal: Negative for abdominal pain, blood in stool and melena.  Genitourinary: Negative for hematuria.  Musculoskeletal: Positive for back pain, joint pain and myalgias (Nighttime cramps).       Walks with cane or rolling walker.  Neurological: Negative for dizziness (Only if she stands up too fast), focal weakness and weakness.  Endo/Heme/Allergies: Negative for environmental allergies.  Psychiatric/Behavioral: Negative for memory loss. The patient is not nervous/anxious and does not have insomnia.    I have reviewed and (if needed) personally updated the patient's problem list, medications, allergies, past medical and surgical history, social and family history.   PAST MEDICAL HISTORY   Past Medical History:  Diagnosis Date  . Allergic rhinoconjunctivitis   . Anxiety   . Arthritis    Spondylolysis, knee and ankle arthritis pains.  . Asthma   . Bipolar disorder ([place])   . Chronic back pain    Has had several surgeries.  He uses a walker  . Depression   . Diabetes mellitus without complication ([place])    dx in 2007  . Diabetes mellitus, type II ([place])   . Eczema   . Headache(784.0)    sinus related   . History of nephrolithiasis   . Hx of pulmonary embolus 2017  . Hyperlipidemia   . Hypertension   . Insomnia   . Memory change   . OSA on CPAP    PSG 05/24/2016: AHI 17/hour 02 mean 76%.  HSAT 06/13/17, AHI 11-hour 02 mean 79%  . Paroxysmal atrial fibrillation ([facility]) 02/24/2021   CHA2DS2-VASc score 4 (HTN, DM, female, age-28); on Eliquis  . Tremor of both hands     PAST SURGICAL HISTORY   Past Surgical History:  Procedure Laterality Date  . ABDOMINAL HYSTERECTOMY    . BACK SURGERY     2012- lumbar fusion  . BREAST SURGERY     L cyst removed - 1972  . BUNIONECTOMY Left    Great toe  . calf -R- cyst removed    . CARPAL TUNNEL RELEASE     both hands   . CORONARY CT ANGIOGRAM   07/17/2019    Coronary calcium score 0.  Right dominant system.  Difficult to assess because of cardiac motion, but there is no obvious evidence of CAD.  Mild PA dilation.   Marland Kitchen HAMMER TOE SURGERY Left    L foot  . NASAL SINUS SURGERY    . NM MYOVIEW LTD  12/2008   Normal study.  Normal LV function.  EF 61%.  Apical thinning but no ischemia or infarction.  Marland Kitchen SHOULDER ARTHROSCOPY Left    R shoulder- RCR  . TOE SURGERY    . TOTAL KNEE ARTHROPLASTY Right 05/22/2017   Procedure: TOTAL KNEE ARTHROPLASTY;  Surgeon: Frederik Pear, MD;  Location: [facility];  Service: Orthopedics;  Laterality: Right;  . TRANSTHORACIC ECHOCARDIOGRAM  06/2009    Technically difficult.  Moderate concentric LVH with normal LV function.  No regional wall motion normalities.  EF> 55%.  Normal right ventricle.  No valve lesions.  Normal filling pressures.  Marland Kitchen TRIGGER FINGER RELEASE     L thumb  . TUBAL LIGATION      Immunization History  Administered Date(s) Administered  . Influenza Split 09/04/2010  . Influenza, High Dose Seasonal PF 11/11/2011  . Influenza,trivalent, recombinat, inj, PF 12/07/2012, 10/04/2013  . PFIZER(Purple Top)SARS-COV-2 Vaccination 01/28/2020, 02/19/2020    MEDICATIONS/ALLERGIES   Current Meds  Medication Sig  . ACCU-CHEK GUIDE test strip TEST BID PRN  . albuterol (PROVENTIL HFA) 108 (90 Base) MCG/ACT inhaler Inhale 2 puffs into the lungs every 4 (four) hours as needed for wheezing or shortness of breath.  Marland Kitchen albuterol (PROVENTIL) (2.5 MG/3ML) 0.083% nebulizer solution USE 1 VIAL IN NEBULIZER EVERY 4 HOURS - and as needed  . apixaban (ELIQUIS) 5 MG TABS tablet Take 1 tablet (5 mg total) by mouth 2 (two) times daily.  . ARIPiprazole (ABILIFY) 2 MG tablet TAKE 2 TABLETS BY MOUTH  DAILY  . azelastine (ASTELIN) 0.1 % nasal spray Place 1 spray into both nostrils 2 (two) times daily.  Marland Kitchen azelastine (OPTIVAR) 0.05 % ophthalmic solution Place 1 drop into both eyes 2 (two) times daily as needed (itchy,  watery eyes).  . Blood Glucose Monitoring Suppl (ACCU-CHEK GUIDE) w/Device KIT See admin instructions.  . budesonide-formoterol (SYMBICORT) 160-4.5 MCG/ACT inhaler INHALE 2 INHALATIONS BY  MOUTH INTO THE LUNGS IN THE MORNING AND AT BEDTIME  . busPIRone (  BUSPAR) 30 MG tablet Take 1 tablet (30 mg total) by mouth 2 (two) times daily.  Marland Kitchen CLOBETASOL PROPIONATE E 0.05 % emollient cream Apply 1 application topically 2 (two) times daily as needed (rash).   . clonazePAM (KLONOPIN) 0.5 MG tablet Take 1/2-1 tab po TID prn anxiety. (Do not combine with Ambien)  . desonide (DESOWEN) 0.05 % ointment APPLY TO AFFECTED AREA(S)  TOPICALLY TWICE DAILY AS  NEEDED  . diltiazem (CARDIZEM CD) 300 MG 24 hr capsule Take 1 capsule (300 mg total) by mouth daily.  Marland Kitchen diltiazem (CARDIZEM) 60 MG tablet Take Diltiazem 60 mg by mouth as needed  for palpitations. You can take every 6 hours as need up to twice a day with each episode.  Marland Kitchen EPINEPHrine 0.3 mg/0.3 mL IJ SOAJ injection INJECT CONTENTS OF 1 PEN AS NEEDED FOR ALLERGIC  RESPONSE AS DIRECTED BY MD. SEEK MEDICAL ATTENTION  AFTER USE.  . famotidine (PEPCID) 20 MG tablet TAKE 1 TABLET BY MOUTH  DAILY AFTER SUPPER.  . fluticasone (FLONASE) 50 MCG/ACT nasal spray USE 2 SPRAYS IN BOTH  NOSTRILS DAILY  . hydrochlorothiazide (HYDRODIURIL) 25 MG tablet Take 25 mg by mouth daily.   . hydrOXYzine (ATARAX/VISTARIL) 25 MG tablet Take 1 tablet (25 mg total) by mouth every 6 (six) hours as needed.  Marland Kitchen levocetirizine (XYZAL) 5 MG tablet Take 1 tablet (5 mg total) by mouth daily as needed for allergies.  . meloxicam (MOBIC) 15 MG tablet Take 1 tablet (15 mg total) by mouth daily.  . metFORMIN (GLUCOPHAGE-XR) 500 MG 24 hr tablet Take 500 mg by mouth 1 day or 1 dose.   . mirtazapine (REMERON) 30 MG tablet TAKE 1 TABLET BY MOUTH AT  BEDTIME  . montelukast (SINGULAIR) 10 MG tablet Take 1 tablet (10 mg total) by mouth at bedtime.  Marland Kitchen NUCALA 100 MG/ML SOAJ INJECT 100MG SUBCUTANEOUSLY EVERY 4 WEEKS  (GIVEN AT MD  OFFICE)  . Oxcarbazepine (TRILEPTAL) 300 MG tablet Take 2 tablets (600 mg total) by mouth at bedtime.  . pantoprazole (PROTONIX) 40 MG tablet TAKE 1 TABLET BY MOUTH  DAILY. TAKE 30 TO 60  MINUTES BEFORE FIRST MEAL  OF THE DAY  . potassium chloride SA (K-DUR) 20 MEQ tablet Take 20 mEq by mouth daily.  . pravastatin (PRAVACHOL) 20 MG tablet Take 20 mg by mouth at bedtime.   . pregabalin (LYRICA) 100 MG capsule Take 100 mg by mouth 3 (three) times daily.  Marland Kitchen Respiratory Therapy Supplies (NEBULIZER) DEVI Use as directed with nebulizer solution.  Marland Kitchen Respiratory Therapy Supplies (NEBULIZER/TUBING/MOUTHPIECE) KIT Use as directed with nebulizer machine  . Semaglutide, 1 MG/DOSE, (OZEMPIC, 1 MG/DOSE,) 2 MG/1.5ML SOPN Take 1 mg by mouth once a week.  . Suvorexant (BELSOMRA) 20 MG TABS Take 20 mg by mouth at bedtime.  Marland Kitchen tiZANidine (ZANAFLEX) 4 MG tablet Take 1 tablet (4 mg total) by mouth every 6 (six) hours as needed for muscle spasms.  Marland Kitchen trimethoprim-polymyxin b (POLYTRIM) ophthalmic solution INSTILL 1 DROP INTO BOTH  EYES EVERY 3 HOURS FOR 7  DAYS AND THEN STOP AS  DIRECTED  . zolpidem (AMBIEN) 10 MG tablet Take 0.5-1 tablets (5-10 mg total) by mouth at bedtime as needed. for sleep  . [DISCONTINUED] aspirin EC 81 MG tablet Take 81 mg by mouth daily.    Allergies  Allergen Reactions  . Cymbalta [Duloxetine Hcl] Other (See Comments)    Caused hair loss  . Depo-Medrol [Methylprednisolone Acetate] Hives    05/29/20 developed hives "all over  my back" after MBB; resolved with Benadryl and "a few days."  Can tolerate Dexamethasone/Decadron.  . Rosuvastatin Calcium Other (See Comments) and Cough    Rapid heart rate  . Almond Meal Itching and Swelling    Tongue swells and itches  . Almond Oil Itching and Swelling    Itching and swelling of tongue   . Carvedilol Cough  . Crestor [Rosuvastatin] Cough  . Fish Allergy Itching    Halibut fish  . Losartan Potassium Cough  . Morphine And Related  Itching    SOCIAL HISTORY/FAMILY HISTORY   Reviewed in Epic:  Pertinent findings:  Social History   Tobacco Use  . Smoking status: Passive Smoke Exposure - Never Smoker  . Smokeless tobacco: Never Used  . Tobacco comment: grandson smokes, but in his car  Vaping Use  . Vaping Use: Never used  Substance Use Topics  . Alcohol use: Yes    Comment: rare use  . Drug use: No   Social History   Social History Narrative   She is currently single.  Has 2 girls.  Has some college education.  Currently disabled due to chronic back pain and not employed.   07/01/19 lives with brother Shanon Brow   Caffeine, none    OBJCTIVE -PE, EKG, labs   Wt Readings from Last 3 Encounters:  02/24/21 177 lb 12.8 oz (80.6 kg)  01/15/21 176 lb 9.6 oz (80.1 kg)  12/31/20 174 lb 9.6 oz (79.2 kg)    Physical Exam: BP 108/70   Pulse 89   Ht 5' 4.75" (1.645 m)   Wt 177 lb 12.8 oz (80.6 kg)   SpO2 95%   BMI 29.82 kg/m  Physical Exam Vitals reviewed.  Constitutional:      General: She is not in acute distress.    Appearance: She is obese. She is not ill-appearing or toxic-appearing.  HENT:     Head: Normocephalic and atraumatic.  Neck:     Vascular: No carotid bruit.  Cardiovascular:     Rate and Rhythm: Normal rate and regular rhythm.  No extrasystoles are present.    Chest Wall: PMI is not displaced (Unable to palpate).     Pulses: Normal pulses and intact distal pulses.     Heart sounds: S1 normal and S2 normal. Heart sounds are distant. Murmur (Soft 1/6 C-D SEM at RUSB) heard.  No gallop. No S4 sounds.   Pulmonary:     Effort: Pulmonary effort is normal. No respiratory distress.     Breath sounds: Normal breath sounds.     Comments: Good air movement Chest:     Chest wall: Tenderness (Reproducible chest pain) present.  Musculoskeletal:        General: No swelling (Trivial). Normal range of motion.     Cervical back: Normal range of motion and neck supple.  Skin:    General: Skin is warm  and dry.  Neurological:     General: No focal deficit present.     Mental Status: She is alert and oriented to person, place, and time.     Gait: Gait abnormal (She uses a cane).  Psychiatric:        Mood and Affect: Mood normal.        Behavior: Behavior normal.        Thought Content: Thought content normal.        Judgment: Judgment normal.     Comments: Somewhat subdued/flat affect     Adult ECG Report  Rate: 89;  Rhythm: normal sinus rhythm and Left atrial enlargement, nonspecific ST-T wave changes.  Normal axis, intervals and durations.;   Narrative Interpretation: Stable EKG.  Recent Labs: Last labs from May 2021: TC 148, TG 56, HDL of 50, LDL 86. No results found for: CHOL, HDL, LDLCALC, LDLDIRECT, TRIG, CHOLHDL Lab Results  Component Value Date   CREATININE 0.62 07/06/2020   BUN 13 07/06/2020   NA 139 07/06/2020   K 4.2 07/06/2020   CL 100 07/06/2020   CO2 26 07/06/2020   CBC Latest Ref Rng & Units 08/14/2019 04/28/2019 02/14/2019  WBC 4.0 - 10.5 K/uL 4.8 5.4 4.7  Hemoglobin 12.0 - 15.0 g/dL 12.8 12.8 11.6(L)  Hematocrit 36.0 - 46.0 % 41.9 41.1 38.7  Platelets 150 - 400 K/uL 289 233 256    No results found for: TSH  ==================================================  COVID-19 Education: The signs and symptoms of COVID-19 were discussed with the patient and how to seek care for testing (follow up with PCP or arrange E-visit).   The importance of social distancing and COVID-19 vaccination was discussed today. The patient is practicing social distancing & Masking.   I spent a total of 45 minutes with the patient spent in direct patient consultation.  Additional time spent with chart review  / charting (studies, outside notes, etc): 20 min Total Time: 65 min   Current medicines are reviewed at length with the patient today.  (+/- concerns) n/a  This visit occurred during the SARS-CoV-2 public health emergency.  Safety protocols were in place, including screening  questions prior to the visit, additional usage of staff PPE, and extensive cleaning of exam room while observing appropriate contact time as indicated for disinfecting solutions.  Notice: This dictation was prepared with Dragon dictation along with smaller phrase technology. Any transcriptional errors that result from this process are unintentional and may not be corrected upon review.  Patient Instructions / Medication Changes & Studies & Tests Ordered   Patient Instructions  Medication Instructions:  STOP ASPIRIN  *If you need a refill on your cardiac medications before your next appointment, please call your pharmacy*  Follow-Up: At [facility], you and your health needs are our priority.  As part of our continuing mission to provide you with exceptional heart care, we have created designated Provider Care Teams.  These Care Teams include your primary Cardiologist (physician) and Advanced Practice Providers (APPs -  Physician Assistants and Nurse Practitioners) who all work together to provide you with the care you need, when you need it.  Your next appointment:   6 month(s)  The format for your next appointment:   In Person  Provider:   Glenetta Hew, MD     Studies Ordered:   Orders Placed This Encounter  Procedures  . EKG 12-Lead     Glenetta Hew, M.D., M.S. Interventional Cardiologist   Pager # [phone] Phone # [phone] [address] [place], [place] [address]   Thank you for choosing Heartcare at [facility]!!

## 2021-02-24 NOTE — Patient Instructions (Addendum)
Medication Instructions:  STOP ASPIRIN  *If you need a refill on your cardiac medications before your next appointment, please call your pharmacy*  Follow-Up: At [facility], you and your health needs are our priority.  As part of our continuing mission to provide you with exceptional heart care, we have created designated Provider Care Teams.  These Care Teams include your primary Cardiologist (physician) and Advanced Practice Providers (APPs -  Physician Assistants and Nurse Practitioners) who all work together to provide you with the care you need, when you need it.  Your next appointment:   6 month(s)  The format for your next appointment:   In Person  Provider:   Bryan Lemma, MD

## 2021-02-24 NOTE — Assessment & Plan Note (Addendum)
During the entire month-long event monitor, only 4 minutes of A. fib noted.  Otherwise rate pretty well controlled with diltiazem-with increased dose of 31 300 mg.. She has already been started on Eliquis. She has Diltiazem short-acting 60 mg tablets.  We discussed the concept of taking the short acting medication for breakthrough tachycardia spells..  This patients CHA2DS2-VASc Score and unadjusted Ischemic Stroke Rate (% per year) is equal to 4.8 % stroke rate/year from a score of 4  Above score calculated as 1 point each if present [CHF, HTN, DM, Vascular=MI/PAD/Aortic Plaque, Age if 65-74, or Female] Above score calculated as 2 points each if present [Age > 75, or Stroke/TIA/TE]

## 2021-02-24 NOTE — Assessment & Plan Note (Addendum)
Blood pressure looks pretty well controlled .  Continue diltiazem and HCTZ.

## 2021-02-24 NOTE — Assessment & Plan Note (Signed)
Well controlled on pravastatin

## 2021-03-08 ENCOUNTER — Ambulatory Visit (INDEPENDENT_AMBULATORY_CARE_PROVIDER_SITE_OTHER): Payer: 59 | Admitting: Psychiatry

## 2021-03-08 DIAGNOSIS — F419 Anxiety disorder, unspecified: Secondary | ICD-10-CM | POA: Diagnosis not present

## 2021-03-08 NOTE — Progress Notes (Signed)
[facility] Counselor/Therapist Progress Note  Patient ID: Chelsea Benson, MRN: [id number],    Date: 03/08/2021  Time Spent: 50 minutes    9:00am to 9:50am  Virtual Visit via MyChart Video Note Connected with patient by a video enabled telemedicine/telehealth application or telephone, with their informed consent, and verified patient privacy and that I am speaking with the correct person using two identifiers. I discussed the limitations, risks, security and privacy concerns of performing psychotherapy and management service by telephone and the availability of in person appointments. I also discussed with the patient that there may be a patient responsible charge related to this service. The patient expressed understanding and agreed to proceed. I discussed the treatment planning with the patient. The patient was provided an opportunity to ask questions and all were answered. The patient agreed with the plan and demonstrated an understanding of the instructions. The patient was advised to call  our office if  symptoms worsen or feel they are in a crisis state and need immediate contact.   Therapist Location: [facility] Patient Location: home  Treatment Type: Individual Therapy  Reported Symptoms: Anxiety decreased, depression decreased, some residual grief re: death of brother-in-law  Mental Status Exam:  Appearance:   Casual     Behavior:  Appropriate, Sharing and Motivated  Motor:  Normal/ but still uses her rollator  Speech/Language:   Clear and Coherent  Affect:  anxious  Mood:  anxious  Thought process:  goal directed  Thought content:    some rumination  Sensory/Perceptual disturbances:    WNL  Orientation:  oriented to person, place, time/date, situation, day of week, month of year and year  Attention:  Good  Concentration:  Good and Fair  Memory:  some forgetting and worse with stress  Fund of knowledge:   Good  Insight:    Good and Fair  Judgment:    Good  Impulse Control:  Good   Risk Assessment: Danger to Self:  No Self-injurious Behavior: No Danger to Others: No Duty to Warn:no Physical Aggression / Violence:No  Access to Firearms a concern: No  Gang Involvement:No   Subjective: Patient today reporting anxiety and depression have decreased. Continued grief issues re: brother-in-law's death. (See Progress Note below).   Interventions: Solution-Oriented/Positive Psychology and Ego-Supportive  Diagnosis:   ICD-10-CM   1. Anxiety disorder, unspecified type  F41.9      Plan: Patient not signing tx goals on computer screen due to COVID.  Treatment Goals:  Goals remain on tx plan as patient works on strategies to meet her goals. Progress will be documented each visit in "Progress" section on Plan.   Long term goal:(measurable) Reduce overall level, frequency, and intensity of the anxiety so that daily functioning is not impaired.* Patient will eventually report a rating of "4"or less" on 1-10 anxiety scale for a73-month time period where her daily functioning is not impaired.  Short term goal: Increase understanding of beliefs and messages that produce the worry and anxiety.  Strategy: Patient will explore and work to stop cognitive messages that feed anxiety and work to replace them with more positive and empowering messages.   PROGRESS: Patient today reporting anxiety and depression have decreased, which is encouraging.  Grief issues continue re: recent death of brother-in-law. Setting healthier boundaries within family is needed but difficult. Situation with brother who lives with patient, previously discussed in prior sessions, and that relationship has improved some most recently.  Focused today on some grief resolution and  setting and keeping healthy boundaries with family and others as needed. Some progress in not assuming the negative in situation, but worked on this more today to build on her progress.  States talking through her concerns more is helpful and the improved, warmer weather is helping also "because I get out of the house more and don't have as many anxious thoughts recently." Also reports being in touch with friends more, baking and cooking, time with family, all of which help her to feel more "up". Patient feels encouraged to be feeling more "up" but also some hesitancy as this has been moe recent for her to feel less anxious and less depressed. Discussed some of the things she is doing differently as noted above.  Encouraged patient's work on interrupting anxious thoughts and assumptions and replacing with more realistic and positive thoughts and assumptions, continuing to get outside daily as she is able, being in touch with people often that are supportive of her, remaining on her prescribed medications, practicing positive self talk, setting appropriate boundaries with others as needed, and feeling good about the strength that she is showing as she works on her goal-directed behaviors to move forward in a more positive direction feeling less anxious and less depressed.  Goal review and progress/challenges noted with patient.  Next appointment within 2 to 3 weeks.   Mathis Fare, LCSW

## 2021-03-13 ENCOUNTER — Encounter: Payer: Self-pay | Admitting: Cardiology

## 2021-03-15 ENCOUNTER — Telehealth: Payer: Self-pay | Admitting: Cardiology

## 2021-03-15 MED ORDER — DILTIAZEM HCL ER COATED BEADS 300 MG PO CP24: 300.0000 mg | ORAL_CAPSULE | Freq: Every day | ORAL | 9 refills | Status: DC

## 2021-03-15 NOTE — Telephone Encounter (Signed)
 *  STAT* If patient is at the pharmacy, call can be transferred to refill team.   1. Which medications need to be refilled? (please list name of each medication and dose if known)   diltiazem (CARDIZEM CD) 300 MG 24 hr capsule  2. Which pharmacy/location (including street and city if local pharmacy) is medication to be sent to?  [facility] - [place], [place] - [address], [address]   3. Do they need a 30 day or 90 day supply? 90 days  Patient needs script sent to [facility]. Previous script was sent local.

## 2021-03-22 ENCOUNTER — Ambulatory Visit (INDEPENDENT_AMBULATORY_CARE_PROVIDER_SITE_OTHER): Payer: 59 | Admitting: Psychiatry

## 2021-03-22 DIAGNOSIS — F419 Anxiety disorder, unspecified: Secondary | ICD-10-CM | POA: Diagnosis not present

## 2021-03-22 NOTE — Progress Notes (Addendum)
[facility] Counselor/Therapist Progress Note  Patient ID: NEIRA BENTSEN, MRN: [id number],    Date: 03/22/2021  Time Spent: 50 minutes   9:00am to 9:50am  Virtual Visit via MyChart Video Note Connected with patient by a video enabled telemedicine/telehealth application or telephone, with their informed consent, and verified patient privacy and that I am speaking with the correct person using two identifiers. I discussed the limitations, risks, security and privacy concerns of performing psychotherapy and management service by telephone and the availability of in person appointments. I also discussed with the patient that there may be a patient responsible charge related to this service. The patient expressed understanding and agreed to proceed. I discussed the treatment planning with the patient. The patient was provided an opportunity to ask questions and all were answered. The patient agreed with the plan and demonstrated an understanding of the instructions. The patient was advised to call  our office if  symptoms worsen or feel they are in a crisis state and need immediate contact.   Therapist Location: [facility] Patient Location: home  Treatment Type: Individual Therapy  Reported Symptoms: anxiety, stress mostly personal and family stressors; brother had stroke at her house and is needing open-heart surgery.     Mental Status Exam:  Appearance:   Casual     Behavior:  Appropriate, Sharing and Motivated  Motor:  uses her rollater to move about  Speech/Language:   Clear and Coherent  Affect:  axious, depressed  Mood:  anxious and depressed  Thought process:  goal directed  Thought content:    some ruminating  Sensory/Perceptual disturbances:    WNL  Orientation:  oriented to person, place, time/date, situation, day of week, month of year and year  Attention:  Good  Concentration:  Fair  Memory:  WNL  Fund of knowledge:   Good  Insight:    Good and Fair   Judgment:   Good  Impulse Control:  Good   Risk Assessment: Danger to Self:  No Self-injurious Behavior: No Danger to Others: No Duty to Warn:no Physical Aggression / Violence:No  Access to Firearms a concern: No  Gang Involvement:No   Subjective:  Patient today reports anxiety and some depression.  Multiple family health stressors and interpersonal issues.  As anxiety rises, she tends to assume the negatives and has difficult time seeing any other options.  (See Progress Note below.)   Interventions: Solution-Oriented/Positive Psychology and Ego-Supportive  Diagnosis:   ICD-10-CM   1. Anxiety disorder, unspecified type  F41.9      Plan: Patient not signing tx goals on computer screen due to COVID.  Treatment Goals:  Goals remain on tx plan as patient works on strategies to meet her goals. Progress will be documented each visit in "Progress" section on Plan.   Long term goal:(measurable) Reduce overall level, frequency, and intensity of the anxiety so that daily functioning is not impaired.* Patient will eventually report a rating of "4"or less" on 1-10 anxiety scale for a105-month time period where her daily functioning is not impaired.  Short term goal: Increase understanding of beliefs and messages that produce the worry and anxiety.  Strategy: Patient will explore and work to stop cognitive messages that feed anxiety and work to replace them with more positive and empowering messages.   PROGRESS: (visit through MyChart Video) Patient today reporting daily anxiety and depression with anxiety being the strongest.  Depression has not been quite as strong recently.  Needed session today to share  and process increased anxiety re: multiple family health concerns including brother who had a stroke at patient's home last week. He got to hospital and was treated, and is following up over the next couple weeks with meds and appts with other medial specialists.   Patient was able to talk through her anxieties, fears, and "worst case scenarios" and feel heard/supported, and able to calm the "worst case scenarios" and replace them some with more realistic view based on reports from hospital and medical staff and admits it is hard to hold onto the positives.  Felt good that brother's stroke ended up being manageable enough to where he was discharged.  Encouraged patient to pick back up on some of the things that have helped her previously including being in touch with friends more, baking, time with family which she has stated before all contribute to her "feeling more up".  Also encouraged patient in recognizing anxious thoughts/assumptions earlier in order to replace with more realistic thoughts and assumptions that do not feed her anxiety.  Encouraged her as she is able to get outside daily, allow other family members to help in ways that they are able, set appropriate limits and boundaries with others as needed, stay in the present focusing on what she can control or change versus cannot, remain on her meds as prescribed, stay in contact with supportive people practice positive self-care and self talk, and feel good about the strength she is showing and trying to move forward in her emotional health while dealing with stressful circumstances.   Review and progress/challenges noted with patient.  Next appointment within 2 to 3 weeks.   Mathis Fare, LCSW

## 2021-03-25 ENCOUNTER — Telehealth: Payer: Self-pay

## 2021-03-25 NOTE — Telephone Encounter (Signed)
Patient called to see if she can switch her appointment to a tele health visit on 04/08/2021. I informed the patient that we are really wanting our patients to come in person now, but I could send back a message to get the okay from Dr Delorse Lek.  Patient states she is taking care of her brother who had a stroke and doesn't want to leave him home alone.   Dr Delorse Lek do you want [place] to do a tele health visit or push her out a little bit so she can follow up in person?  Thanks

## 2021-03-30 ENCOUNTER — Other Ambulatory Visit: Payer: Self-pay | Admitting: Allergy

## 2021-04-06 ENCOUNTER — Telehealth: Payer: 59 | Admitting: Psychiatry

## 2021-04-07 ENCOUNTER — Telehealth (INDEPENDENT_AMBULATORY_CARE_PROVIDER_SITE_OTHER): Payer: 59 | Admitting: Psychiatry

## 2021-04-07 ENCOUNTER — Encounter: Payer: Self-pay | Admitting: Psychiatry

## 2021-04-07 DIAGNOSIS — F419 Anxiety disorder, unspecified: Secondary | ICD-10-CM

## 2021-04-07 DIAGNOSIS — F410 Panic disorder [episodic paroxysmal anxiety] without agoraphobia: Secondary | ICD-10-CM | POA: Diagnosis not present

## 2021-04-07 MED ORDER — BUSPIRONE HCL 30 MG PO TABS: 30.0000 mg | ORAL_TABLET | Freq: Two times a day (BID) | ORAL | 2 refills | Status: DC

## 2021-04-07 MED ORDER — HYDROXYZINE HCL 25 MG PO TABS: 25.0000 mg | ORAL_TABLET | Freq: Four times a day (QID) | ORAL | 0 refills | Status: DC | PRN

## 2021-04-07 MED ORDER — CLONAZEPAM 0.5 MG PO TABS
ORAL_TABLET | ORAL | 2 refills | Status: DC
Start: 2021-04-07 — End: 2021-08-06

## 2021-04-07 NOTE — Progress Notes (Signed)
Chelsea Benson [id number] Apr 08, 1951 70 y.o.  Virtual Visit via Video Note  I connected with pt @ on 04/07/21 at 10:30 AM EDT by a video enabled telemedicine application and verified that I am speaking with the correct person using two identifiers.   I discussed the limitations of evaluation and management by telemedicine and the availability of in person appointments. The patient expressed understanding and agreed to proceed.  I discussed the assessment and treatment plan with the patient. The patient was provided an opportunity to ask questions and all were answered. The patient agreed with the plan and demonstrated an understanding of the instructions.   The patient was advised to call back or seek an in-person evaluation if the symptoms worsen or if the condition fails to improve as anticipated.  I provided 30 minutes of non-face-to-face time during this encounter.  The patient was located at home.  The provider was located at [facility].   Thayer Headings, PMHNP   Subjective:   Patient ID:  Chelsea Benson is a 70 y.o. (DOB [birth year]) female.  Chief Complaint:  Chief Complaint  Patient presents with  . Anxiety  . Sleeping Problem    HPI RHANDI DESPAIN presents for follow-up of anxiety, mood disturbance, and sleep disturbance. Brother had a stroke in April. Brother is also going to have open heart surgery in August. 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 She has been having excessive worry about brother. She reports that her worry has been increased in general. She reports that she has been feeling "exhausted." She reports that she is having panic attacks about every other day. Denies intrusive memories or nightmares. She has been having difficulty staying asleep due to listening out for brother. She reports, "I don't have a mood." Minimal irritability.  Energy and motivation have been low. She is continuing to keep up with school. Concentration has been diminished with  increased stress. Appetite has been decreased. Denies impulsivity or risky behavior. Denies SI.   She has been using Klonopin prn TID recently. She reports that this is helpful for panic s/s. She has been taking Ambien prn some of the time.   She reports, "I'm getting by."   Past Psychiatric Medication Trials: Cymbalta-hair loss Mirtazapine Prozac Trazodone-ineffective Ambien BuSpar Hydroxyzine Abilify Trileptal Xanax  Review of Systems:  Review of Systems  Cardiovascular: Positive for palpitations.  Musculoskeletal: Negative for gait problem.  Neurological: Negative for tremors.       Denies involuntary movements and none noted on video.   Psychiatric/Behavioral:       Please refer to HPI    Medications: I have reviewed the patient's current medications.  Current Outpatient Medications  Medication Sig Dispense Refill  . albuterol (PROVENTIL HFA) 108 (90 Base) MCG/ACT inhaler Inhale 2 puffs into the lungs every 4 (four) hours as needed for wheezing or shortness of breath. 54 g 0  . apixaban (ELIQUIS) 5 MG TABS tablet Take 1 tablet (5 mg total) by mouth 2 (two) times daily. 60 tablet 11  . ARIPiprazole (ABILIFY) 2 MG tablet TAKE 2 TABLETS BY MOUTH  DAILY 180 tablet 3  . azelastine (ASTELIN) 0.1 % nasal spray Place 1 spray into both nostrils 2 (two) times daily. 90 mL 1  . azelastine (OPTIVAR) 0.05 % ophthalmic solution Place 1 drop into both eyes 2 (two) times daily as needed (itchy, watery eyes). 6 mL 5  . budesonide-formoterol (SYMBICORT) 160-4.5 MCG/ACT inhaler INHALE 2 INHALATIONS BY  MOUTH INTO THE LUNGS IN THE MORNING AND  AT BEDTIME 30.6 g 3  . diltiazem (CARDIZEM CD) 300 MG 24 hr capsule Take 1 capsule (300 mg total) by mouth daily. 30 capsule 9  . diltiazem (CARDIZEM) 60 MG tablet Take Diltiazem 60 mg by mouth as needed  for palpitations. You can take every 6 hours as need up to twice a day with each episode. 60 tablet 6  . famotidine (PEPCID) 20 MG tablet TAKE 1 TABLET  BY MOUTH  DAILY AFTER SUPPER. 90 tablet 3  . fluticasone (FLONASE) 50 MCG/ACT nasal spray USE 2 SPRAYS IN BOTH  NOSTRILS DAILY 48 g 0  . hydrochlorothiazide (HYDRODIURIL) 25 MG tablet Take 25 mg by mouth daily.     Marland Kitchen levocetirizine (XYZAL) 5 MG tablet Take 1 tablet (5 mg total) by mouth daily as needed for allergies. 30 tablet 5  . meloxicam (MOBIC) 15 MG tablet Take 1 tablet (15 mg total) by mouth daily. 30 tablet 1  . metFORMIN (GLUCOPHAGE-XR) 500 MG 24 hr tablet Take 500 mg by mouth 1 day or 1 dose.     . mirtazapine (REMERON) 30 MG tablet TAKE 1 TABLET BY MOUTH AT  BEDTIME 90 tablet 3  . montelukast (SINGULAIR) 10 MG tablet Take 1 tablet (10 mg total) by mouth at bedtime. 90 tablet 3  . NUCALA 100 MG/ML SOAJ INJECT 100MG SUBCUTANEOUSLY EVERY 4 WEEKS (GIVEN AT MD  OFFICE) 1 mL 11  . Oxcarbazepine (TRILEPTAL) 300 MG tablet Take 2 tablets (600 mg total) by mouth at bedtime. 180 tablet 2  . pantoprazole (PROTONIX) 40 MG tablet TAKE 1 TABLET BY MOUTH  DAILY. TAKE 30 TO 60  MINUTES BEFORE FIRST MEAL  OF THE DAY 90 tablet 3  . potassium chloride SA (K-DUR) 20 MEQ tablet Take 20 mEq by mouth daily.    . pravastatin (PRAVACHOL) 20 MG tablet Take 20 mg by mouth at bedtime.     . pregabalin (LYRICA) 100 MG capsule Take 100 mg by mouth 3 (three) times daily.    . Semaglutide, 1 MG/DOSE, (OZEMPIC, 1 MG/DOSE,) 2 MG/1.5ML SOPN Take 1 mg by mouth once a week.    . Suvorexant (BELSOMRA) 20 MG TABS Take 20 mg by mouth at bedtime. 90 tablet 1  . tiZANidine (ZANAFLEX) 4 MG tablet Take 1 tablet (4 mg total) by mouth every 6 (six) hours as needed for muscle spasms. 30 tablet 0  . ACCU-CHEK GUIDE test strip TEST BID PRN  1  . albuterol (PROVENTIL) (2.5 MG/3ML) 0.083% nebulizer solution USE 1 VIAL IN NEBULIZER EVERY 4 HOURS - and as needed 3 mL 5  . Blood Glucose Monitoring Suppl (ACCU-CHEK GUIDE) w/Device KIT See admin instructions.  1  . busPIRone (BUSPAR) 30 MG tablet Take 1 tablet (30 mg total) by mouth 2 (two)  times daily. 180 tablet 2  . CLOBETASOL PROPIONATE E 0.05 % emollient cream Apply 1 application topically 2 (two) times daily as needed (rash).     . clonazePAM (KLONOPIN) 0.5 MG tablet Take 1/2-1 tab po TID prn anxiety. (Do not combine with Ambien) 90 tablet 2  . desonide (DESOWEN) 0.05 % ointment APPLY TO AFFECTED AREA(S)  TOPICALLY TWICE DAILY AS  NEEDED 180 g 1  . EPINEPHrine 0.3 mg/0.3 mL IJ SOAJ injection INJECT CONTENTS OF 1 PEN AS NEEDED FOR ALLERGIC  RESPONSE AS DIRECTED BY MD. SEEK MEDICAL ATTENTION  AFTER USE. 4 each 1  . hydrOXYzine (ATARAX/VISTARIL) 25 MG tablet Take 1 tablet (25 mg total) by mouth every 6 (six) hours as  needed. 180 tablet 0  . Respiratory Therapy Supplies (NEBULIZER) DEVI Use as directed with nebulizer solution. 1 each 1  . Respiratory Therapy Supplies (NEBULIZER/TUBING/MOUTHPIECE) KIT Use as directed with nebulizer machine 1 kit 12  . trimethoprim-polymyxin b (POLYTRIM) ophthalmic solution INSTILL 1 DROP INTO BOTH  EYES EVERY 3 HOURS FOR 7  DAYS AND THEN STOP AS  DIRECTED 10 mL 0  . zolpidem (AMBIEN) 10 MG tablet Take 0.5-1 tablets (5-10 mg total) by mouth at bedtime as needed. for sleep 90 tablet 1   No current facility-administered medications for this visit.    Medication Side Effects: None  Allergies:  Allergies  Allergen Reactions  . Cymbalta [Duloxetine Hcl] Other (See Comments)    Caused hair loss  . Depo-Medrol [Methylprednisolone Acetate] Hives    05/29/20 developed hives "all over my back" after MBB; resolved with Benadryl and "a few days."  Can tolerate Dexamethasone/Decadron.  . Rosuvastatin Calcium Other (See Comments) and Cough    Rapid heart rate  . Almond Meal Itching and Swelling    Tongue swells and itches  . Almond Oil Itching and Swelling    Itching and swelling of tongue   . Carvedilol Cough  . Crestor [Rosuvastatin] Cough  . Fish Allergy Itching    Halibut fish  . Losartan Potassium Cough  . Morphine And Related Itching     Past Medical History:  Diagnosis Date  . Allergic rhinoconjunctivitis   . Anxiety   . Arthritis    Spondylolysis, knee and ankle arthritis pains.  . Asthma   . Bipolar disorder ([place])   . Chronic back pain    Has had several surgeries.  He uses a walker  . Depression   . Diabetes mellitus without complication ([place])    dx in 2007  . Diabetes mellitus, type II ([place])   . Eczema   . Headache(784.0)    sinus related   . History of nephrolithiasis   . Hx of pulmonary embolus 2017  . Hyperlipidemia   . Hypertension   . Insomnia   . Memory change   . OSA on CPAP    PSG 05/24/2016: AHI 17/hour 02 mean 76%.  HSAT 06/13/17, AHI 11-hour 02 mean 79%  . Paroxysmal atrial fibrillation ([facility]) 02/24/2021   CHA2DS2-VASc score 4 (HTN, DM, female, age-81); on Eliquis  . Tremor of both hands     Family History  Problem Relation Age of Onset  . Diabetes Sister   . Diabetes Brother   . Hypertension Brother   . Cancer Maternal Grandmother   . Heart failure Maternal Grandmother   . Hypertension Paternal Grandmother   . Asthma Daughter   . Cancer Daughter   . Depression Daughter   . Hypertension Daughter   . Heart failure Maternal Aunt   . Pneumonia Mother   . Diabetes Mother   . Alcoholism Father   . Alcohol abuse Father   . Depression Daughter   . Schizophrenia Grandchild   . Allergies Neg Hx   . Eczema Neg Hx   . Immunodeficiency Neg Hx     Social History   Socioeconomic History  . Marital status: Single    Spouse name: Not on file  . Number of children: 2  . Years of education: Not on file  . Highest education level: Some college, no degree  Occupational History    Comment: NA  Tobacco Use  . Smoking status: Passive Smoke Exposure - Never Smoker  . Smokeless tobacco: Never Used  .  Tobacco comment: grandson smokes, but in his car  Vaping Use  . Vaping Use: Never used  Substance and Sexual Activity  . Alcohol use: Yes    Comment: rare use  . Drug use: No  . Sexual  activity: Not Currently  Other Topics Concern  . Not on file  Social History Narrative   She is currently single.  Has 2 girls.  Has some college education.  Currently disabled due to chronic back pain and not employed.   07/01/19 lives with brother Shanon Brow   Caffeine, none   Social Determinants of Health   Financial Resource Strain: Not on file  Food Insecurity: Not on file  Transportation Needs: Not on file  Physical Activity: Not on file  Stress: Not on file  Social Connections: Not on file  Intimate Partner Violence: Not on file    Past Medical History, Surgical history, Social history, and Family history were reviewed and updated as appropriate.   Please see review of systems for further details on the patient's review from today.   Objective:   Physical Exam:  There were no vitals taken for this visit.  Physical Exam Neurological:     Mental Status: She is alert and oriented to person, place, and time.     Cranial Nerves: No dysarthria.  Psychiatric:        Attention and Perception: Attention and perception normal.        Mood and Affect: Mood is anxious.        Speech: Speech normal.        Behavior: Behavior is cooperative.        Thought Content: Thought content normal. Thought content is not paranoid or delusional. Thought content does not include homicidal or suicidal ideation. Thought content does not include homicidal or suicidal plan.        Cognition and Memory: Cognition and memory normal.        Judgment: Judgment normal.     Comments: Insight intact     Lab Review:     Component Value Date/Time   NA 139 07/06/2020 0834   K 4.2 07/06/2020 0834   CL 100 07/06/2020 0834   CO2 26 07/06/2020 0834   GLUCOSE 277 (H) 07/06/2020 0834   GLUCOSE 120 (H) 08/14/2019 0859   BUN 13 07/06/2020 0834   CREATININE 0.62 07/06/2020 0834   CALCIUM 10.2 07/06/2020 0834   PROT 7.3 04/28/2019 1712   ALBUMIN 4.2 04/28/2019 1712   AST 21 04/28/2019 1712   ALT 30  04/28/2019 1712   ALKPHOS 68 04/28/2019 1712   BILITOT 0.5 04/28/2019 1712   GFRNONAA 94 07/06/2020 0834   GFRAA 108 07/06/2020 0834       Component Value Date/Time   WBC 4.8 08/14/2019 0859   RBC 4.86 08/14/2019 0859   HGB 12.8 08/14/2019 0859   HGB 12.5 11/08/2018 1041   HCT 41.9 08/14/2019 0859   HCT 39.5 11/08/2018 1041   PLT 289 08/14/2019 0859   MCV 86.2 08/14/2019 0859   MCV 81 11/08/2018 1041   MCH 26.3 08/14/2019 0859   MCHC 30.5 08/14/2019 0859   RDW 14.9 08/14/2019 0859   RDW 14.3 11/08/2018 1041   LYMPHSABS 2.1 04/28/2019 1712   LYMPHSABS 1.4 11/08/2018 1041   MONOABS 0.5 04/28/2019 1712   EOSABS 0.3 04/28/2019 1712   EOSABS 0.2 11/08/2018 1041   BASOSABS 0.0 04/28/2019 1712   BASOSABS 0.0 11/08/2018 1041    No results found for: POCLITH, LITHIUM  No results found for: PHENYTOIN, PHENOBARB, VALPROATE, CBMZ   .res Assessment: Plan:   Patient seen for 30 minutes and time spent discussing possible treatment options.  Discussed option to continue current medications with close monitoring for worsening anxiety and insomnia or increasing Abilify to 5 mg daily to potentially improve mood and anxiety.  Patient reports that she would prefer to continue current plan of care since increased anxiety and sleep disturbance since recent worsening symptoms are situational and she has been able to continue to function. Continue BuSpar 30 mg twice daily for anxiety. Continue hydroxyzine 25 mg every 6 hours as needed for anxiety. Continue Klonopin 1/2 to 1 tablet 3 times daily as needed for anxiety. Continue Belsomra 20 mg at bedtime for insomnia. Continue Ambien 5 to 10 mg as needed for insomnia. Continue Remeron 30 mg at bedtime for mood signs and symptoms. Continue Trileptal 600 mg at bedtime for mood stabilization. Continue Abilify 4 mg daily for mood signs and symptoms. Recommend continuing psychotherapy with Rinaldo Cloud, LCSW. Patient to follow-up in 2 months or sooner  if clinically indicated. Patient advised to contact office with any questions, adverse effects, or acute worsening in signs and symptoms.  Chelsea Benson was seen today for anxiety and sleeping problem.  Diagnoses and all orders for this visit:  Anxiety disorder, unspecified type -     busPIRone (BUSPAR) 30 MG tablet; Take 1 tablet (30 mg total) by mouth 2 (two) times daily. -     hydrOXYzine (ATARAX/VISTARIL) 25 MG tablet; Take 1 tablet (25 mg total) by mouth every 6 (six) hours as needed.  Panic disorder -     clonazePAM (KLONOPIN) 0.5 MG tablet; Take 1/2-1 tab po TID prn anxiety. (Do not combine with Ambien)     Please see After Visit Summary for patient specific instructions.  Future Appointments  Date Time Provider [place]  04/08/2021 11:20 AM Kennith Gain, MD [facility] None  04/09/2021 10:00 AM Shanon Ace, LCSW [facility] None  04/22/2021  1:00 PM Shanon Ace, LCSW [facility] None  05/06/2021 10:00 AM Shanon Ace, LCSW [facility] None  06/01/2021  9:00 AM Shanon Ace, LCSW [facility] None    No orders of the defined types were placed in this encounter.     -------------------------------

## 2021-04-08 ENCOUNTER — Ambulatory Visit (INDEPENDENT_AMBULATORY_CARE_PROVIDER_SITE_OTHER): Payer: 59 | Admitting: Allergy

## 2021-04-08 ENCOUNTER — Encounter: Payer: Self-pay | Admitting: Allergy

## 2021-04-08 ENCOUNTER — Other Ambulatory Visit: Payer: Self-pay

## 2021-04-08 DIAGNOSIS — H1013 Acute atopic conjunctivitis, bilateral: Secondary | ICD-10-CM | POA: Diagnosis not present

## 2021-04-08 DIAGNOSIS — J4541 Moderate persistent asthma with (acute) exacerbation: Secondary | ICD-10-CM | POA: Diagnosis not present

## 2021-04-08 DIAGNOSIS — T7800XD Anaphylactic reaction due to unspecified food, subsequent encounter: Secondary | ICD-10-CM

## 2021-04-08 DIAGNOSIS — L2089 Other atopic dermatitis: Secondary | ICD-10-CM

## 2021-04-08 DIAGNOSIS — J3089 Other allergic rhinitis: Secondary | ICD-10-CM

## 2021-04-08 DIAGNOSIS — T50905D Adverse effect of unspecified drugs, medicaments and biological substances, subsequent encounter: Secondary | ICD-10-CM

## 2021-04-08 MED ORDER — FLUTICASONE PROPIONATE 50 MCG/ACT NA SUSP: 2.0000 | Freq: Every day | NASAL | 5 refills | Status: DC

## 2021-04-08 MED ORDER — PREDNISONE 10 MG PO TABS: ORAL_TABLET | ORAL | 0 refills | Status: DC

## 2021-04-08 MED ORDER — BEPOTASTINE BESILATE 1.5 % OP SOLN: 1.0000 [drp] | Freq: Two times a day (BID) | OPHTHALMIC | 5 refills | Status: DC | PRN

## 2021-04-08 MED ORDER — BUDESONIDE-FORMOTEROL FUMARATE 160-4.5 MCG/ACT IN AERO: INHALATION_SPRAY | RESPIRATORY_TRACT | 3 refills | Status: DC

## 2021-04-08 MED ORDER — MONTELUKAST SODIUM 10 MG PO TABS: 10.0000 mg | ORAL_TABLET | Freq: Every day | ORAL | 1 refills | Status: DC

## 2021-04-08 MED ORDER — CARBINOXAMINE MALEATE 6 MG PO TABS: ORAL_TABLET | ORAL | 5 refills | Status: DC

## 2021-04-08 MED ORDER — IPRATROPIUM BROMIDE 0.06 % NA SOLN: 2.0000 | Freq: Three times a day (TID) | NASAL | 5 refills | Status: DC

## 2021-04-08 NOTE — Progress Notes (Signed)
RE: CEAIRRA MCCARVER MRN: [id number] DOB: 03-Apr-1951 Date of Telemedicine Visit: 04/08/2021  Referring provider: Marda Stalker, PA-C Primary care provider: Marda Stalker, PA-C  Chief Complaint: Asthma, Cough (Cough with congestion all day ), and Allergic Rhinitis  (Watery eyes with crust, ear pain, post nasal drip, sneezing )   Telemedicine Follow Up Visit via Telephone: I connected with Damary Doland for a follow up on 04/08/21 by telephone and verified that I am speaking with the correct person using two identifiers.   I discussed the limitations, risks, security and privacy concerns of performing an evaluation and management service by telephone and the availability of in person appointments. I also discussed with the patient that there may be a patient responsible charge related to this service. The patient expressed understanding and agreed to proceed.  Patient is at home. Provider is at the office.  Visit start time: 1122 Visit end time: [place] consent/check in by: [facility] consent and medical assistant/nurse: Diandra  History of Present Illness: She is a 70 y.o. female, who is being followed for allergic rhinitis with conjunctivitis, asthma, food allergy, eczema and adverse medication effect. Her previous allergy office visit was on 01/15/2021 with Dr. Nelva Bush.   She states her allergies have been worse now that the pollen is out.  She is having more nasal drainage, cough, sneezing, watery eyes with crusting in the morning.  She also reports having more cough and wheeze in the morning and at night where she is using her albuterol.  She reports having chest congestion with a productive cough and has been taking Mucinex.  Her current medication regimen of Astelin, Xyzal, Optivar and Flonase did not seem to be helping any of her allergy symptoms at this time.  She does continue to take Singulair daily.  She also takes Symbicort 160 mcg 2 puffs twice a day and  receives Nucala injections once a month.  She is tolerating her Nucala injections without any issue. She also continues to avoid almonds and has not needed to use her epinephrine device.  She has access to triamcinolone for eczema flare control.   She states she currently is very tired as she has been helping to take care of her brother who had a stroke.  Assessment and Plan: Jemma is a 70 y.o. female with:   Allergic rhinitis with conjunctivitis  - Will revamp her allergy medication regimen as below: -Can continue Flonase 2 sprays daily as needed.  Use flonase for 1-2 weeks at a time before stopping once symptoms improve. -Stop Astelin.  Use nasal Atrovent 2 sprays each nostril up to 3-4 times a day as needed for nasal drainage control - Continue saline nasal rinses once a day for nasal symptoms. Use before nasal sprays  -Stop Xyzal.  Use RyVent 6 mg twice a day. - Continue Singulair 10 mg at bedtime -Stop Optivar.  Start Bepreve 1 drop in each twice a day as needed for watery, itchy, red eyes - Continue allergen avoidance measures -We will have her take a 5-day prednisone burst to help with allergy symptom control   Moderate persistent asthma with exacerbation - Continue Symbicort 160 - 2 puffs twice daily - Continue singulair as above  - have access to albuterol inhaler 2 puffs every 4-6 hours as needed for cough/wheeze/shortness of breath/chest tightness.  May use 15-20 minutes prior to activity.   Monitor frequency of use.   -Continue Nucala monthly autoinjector to allow for ease of home administration.  Daugther is  giving her injections once a month.   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 - Carry EpiPen at all times.   Flexural atopic dermatitis - Continue moisturizing routine with Aveeno - Use triamcinolone 0.1% below the face.   Adverse effect of drug  -Development of hives following injection of Depo-Medrol to the back. I have  advised that she avoid this particular steroid in the future. It is uncommon that 1 will have reaction to a different steroid basis. As mentioned she has tolerated cortisone and Kenalog injections in the past however they have not been effective for her back pain management. She also tolerates prednisone also without any issue. Thus if there are any other steroid bases that are used as injections then will recommend she try a different option.  Follow up in 3-6 or sooner if needed.    Diagnostics: ACT score 15-this indicates not good control  Medication List:  Current Outpatient Medications  Medication Sig Dispense Refill  . ACCU-CHEK GUIDE test strip TEST BID PRN  1  . albuterol (PROVENTIL HFA) 108 (90 Base) MCG/ACT inhaler Inhale 2 puffs into the lungs every 4 (four) hours as needed for wheezing or shortness of breath. 54 g 0  . albuterol (PROVENTIL) (2.5 MG/3ML) 0.083% nebulizer solution USE 1 VIAL IN NEBULIZER EVERY 4 HOURS - and as needed 3 mL 5  . apixaban (ELIQUIS) 5 MG TABS tablet Take 1 tablet (5 mg total) by mouth 2 (two) times daily. 60 tablet 11  . ARIPiprazole (ABILIFY) 2 MG tablet TAKE 2 TABLETS BY MOUTH  DAILY 180 tablet 3  . azelastine (ASTELIN) 0.1 % nasal spray Place 1 spray into both nostrils 2 (two) times daily. 90 mL 1  . azelastine (OPTIVAR) 0.05 % ophthalmic solution Place 1 drop into both eyes 2 (two) times daily as needed (itchy, watery eyes). 6 mL 5  . Blood Glucose Monitoring Suppl (ACCU-CHEK GUIDE) w/Device KIT See admin instructions.  1  . budesonide-formoterol (SYMBICORT) 160-4.5 MCG/ACT inhaler INHALE 2 INHALATIONS BY  MOUTH INTO THE LUNGS IN THE MORNING AND AT BEDTIME 30.6 g 3  . busPIRone (BUSPAR) 30 MG tablet Take 1 tablet (30 mg total) by mouth 2 (two) times daily. 180 tablet 2  . CLOBETASOL PROPIONATE E 0.05 % emollient cream Apply 1 application topically 2 (two) times daily as needed (rash).     . clonazePAM (KLONOPIN) 0.5 MG tablet Take 1/2-1 tab po TID prn  anxiety. (Do not combine with Ambien) 90 tablet 2  . desonide (DESOWEN) 0.05 % ointment APPLY TO AFFECTED AREA(S)  TOPICALLY TWICE DAILY AS  NEEDED 180 g 1  . diltiazem (CARDIZEM CD) 300 MG 24 hr capsule Take 1 capsule (300 mg total) by mouth daily. 30 capsule 9  . diltiazem (CARDIZEM) 60 MG tablet Take Diltiazem 60 mg by mouth as needed  for palpitations. You can take every 6 hours as need up to twice a day with each episode. 60 tablet 6  . EPINEPHrine 0.3 mg/0.3 mL IJ SOAJ injection INJECT CONTENTS OF 1 PEN AS NEEDED FOR ALLERGIC  RESPONSE AS DIRECTED BY MD. SEEK MEDICAL ATTENTION  AFTER USE. 4 each 1  . famotidine (PEPCID) 20 MG tablet TAKE 1 TABLET BY MOUTH  DAILY AFTER SUPPER. 90 tablet 3  . fluticasone (FLONASE) 50 MCG/ACT nasal spray USE 2 SPRAYS IN BOTH  NOSTRILS DAILY 48 g 0  . hydrochlorothiazide (HYDRODIURIL) 25 MG tablet Take 25 mg by mouth daily.     Marland Kitchen  hydrOXYzine (ATARAX/VISTARIL) 25 MG tablet Take 1 tablet (25 mg total) by mouth every 6 (six) hours as needed. 180 tablet 0  . levocetirizine (XYZAL) 5 MG tablet Take 1 tablet (5 mg total) by mouth daily as needed for allergies. 30 tablet 5  . meloxicam (MOBIC) 15 MG tablet Take 1 tablet (15 mg total) by mouth daily. 30 tablet 1  . metFORMIN (GLUCOPHAGE-XR) 500 MG 24 hr tablet Take 500 mg by mouth 1 day or 1 dose.     . mirtazapine (REMERON) 30 MG tablet TAKE 1 TABLET BY MOUTH AT  BEDTIME 90 tablet 3  . montelukast (SINGULAIR) 10 MG tablet Take 1 tablet (10 mg total) by mouth at bedtime. 90 tablet 3  . NUCALA 100 MG/ML SOAJ INJECT 100MG SUBCUTANEOUSLY EVERY 4 WEEKS (GIVEN AT MD  OFFICE) 1 mL 11  . Oxcarbazepine (TRILEPTAL) 300 MG tablet Take 2 tablets (600 mg total) by mouth at bedtime. 180 tablet 2  . pantoprazole (PROTONIX) 40 MG tablet TAKE 1 TABLET BY MOUTH  DAILY. TAKE 30 TO 60  MINUTES BEFORE FIRST MEAL  OF THE DAY 90 tablet 3  . potassium chloride SA (K-DUR) 20 MEQ tablet Take 20 mEq by mouth daily.    . pravastatin (PRAVACHOL) 20  MG tablet Take 20 mg by mouth at bedtime.     . pregabalin (LYRICA) 100 MG capsule Take 100 mg by mouth 3 (three) times daily.    Marland Kitchen Respiratory Therapy Supplies (NEBULIZER) DEVI Use as directed with nebulizer solution. 1 each 1  . Respiratory Therapy Supplies (NEBULIZER/TUBING/MOUTHPIECE) KIT Use as directed with nebulizer machine 1 kit 12  . Semaglutide, 1 MG/DOSE, (OZEMPIC, 1 MG/DOSE,) 2 MG/1.5ML SOPN Take 1 mg by mouth once a week.    . Suvorexant (BELSOMRA) 20 MG TABS Take 20 mg by mouth at bedtime. 90 tablet 1  . tiZANidine (ZANAFLEX) 4 MG tablet Take 1 tablet (4 mg total) by mouth every 6 (six) hours as needed for muscle spasms. 30 tablet 0  . trimethoprim-polymyxin b (POLYTRIM) ophthalmic solution INSTILL 1 DROP INTO BOTH  EYES EVERY 3 HOURS FOR 7  DAYS AND THEN STOP AS  DIRECTED 10 mL 0  . zolpidem (AMBIEN) 10 MG tablet Take 0.5-1 tablets (5-10 mg total) by mouth at bedtime as needed. for sleep 90 tablet 1   No current facility-administered medications for this visit.   Allergies: Allergies  Allergen Reactions  . Cymbalta [Duloxetine Hcl] Other (See Comments)    Caused hair loss  . Depo-Medrol [Methylprednisolone Acetate] Hives    05/29/20 developed hives "all over my back" after MBB; resolved with Benadryl and "a few days."  Can tolerate Dexamethasone/Decadron.  . Rosuvastatin Calcium Other (See Comments) and Cough    Rapid heart rate  . Almond Meal Itching and Swelling    Tongue swells and itches  . Almond Oil Itching and Swelling    Itching and swelling of tongue   . Carvedilol Cough  . Crestor [Rosuvastatin] Cough  . Fish Allergy Itching    Halibut fish  . Losartan Potassium Cough  . Morphine And Related Itching   I reviewed her past medical history, social history, family history, and environmental history and no significant changes have been reported from previous visit on 01/15/21.  Review of Systems  Constitutional: Positive for fatigue.  HENT: Positive for  congestion and postnasal drip.   Eyes: Positive for discharge and itching.  Respiratory: Positive for cough and shortness of breath.   Cardiovascular: Negative.  Gastrointestinal: Negative.   Musculoskeletal: Negative.   Skin: Negative.   Neurological: Negative.    Objective: Physical Exam Not obtained as encounter was done via telephone.   Previous notes and tests were reviewed.  I discussed the assessment and treatment plan with the patient. The patient was provided an opportunity to ask questions and all were answered. The patient agreed with the plan and demonstrated an understanding of the instructions.   The patient was advised to call back or seek an in-person evaluation if the symptoms worsen or if the condition fails to improve as anticipated.  I provided 18 minutes of non-face-to-face time during this encounter.  It was my pleasure to participate in [facility] care today. Please feel free to contact me with any questions or concerns.   Sincerely,   Charmian Muff, MD

## 2021-04-08 NOTE — Patient Instructions (Signed)
Allergic rhinitis with conjunctivitis  - Will revamp her allergy medication regimen as below: -Can continue Flonase 2 sprays daily as needed.  Use flonase for 1-2 weeks at a time before stopping once symptoms improve. -Stop Astelin.  Use nasal Atrovent 2 sprays each nostril up to 3-4 times a day as needed for nasal drainage control - Continue saline nasal rinses once a day for nasal symptoms. Use before nasal sprays  -Stop Xyzal.  Use RyVent 6 mg twice a day. - Continue Singulair 10 mg at bedtime -Stop Optivar.  Start Bepreve 1 drop in each twice a day as needed for watery, itchy, red eyes - Continue allergen avoidance measures -We will have her take a 5-day prednisone burst to help with allergy symptom control   Moderate persistent asthma with exacerbation - Continue Symbicort 160 - 2 puffs twice daily - Continue singulair as above  - have access to albuterol inhaler 2 puffs every 4-6 hours as needed for cough/wheeze/shortness of breath/chest tightness.  May use 15-20 minutes prior to activity.   Monitor frequency of use.   -Continue Nucala monthly autoinjector to allow for ease of home administration.  Daugther is giving her injections once a month.   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 - Carry EpiPen at all times.   Flexural atopic dermatitis - Continue moisturizing routine with Aveeno - Use triamcinolone 0.1% below the face.   Adverse effect of drug  -Development of hives following injection of Depo-Medrol to the back. I have advised that she avoid this particular steroid in the future. It is uncommon that 1 will have reaction to a different steroid basis. As mentioned she has tolerated cortisone and Kenalog injections in the past however they have not been effective for her back pain management. She also tolerates prednisone also without any issue. Thus if there are any other steroid bases that are used as injections then will recommend she try  a different option.  Follow up in 3-6 or sooner if needed.

## 2021-04-09 ENCOUNTER — Ambulatory Visit (INDEPENDENT_AMBULATORY_CARE_PROVIDER_SITE_OTHER): Payer: 59 | Admitting: Psychiatry

## 2021-04-09 DIAGNOSIS — F419 Anxiety disorder, unspecified: Secondary | ICD-10-CM | POA: Diagnosis not present

## 2021-04-09 NOTE — Progress Notes (Signed)
[facility] Counselor/Therapist Progress Note  Patient ID: Chelsea Benson, MRN: [id number],    Date: 04/09/2021  Time Spent: 50 minutes   10:00am to 10:50am  Virtual Visit via MyChart VIDEO Note: Connected with patient by a video enabled telemedicine/telehealth application or telephone, with their informed consent, and verified patient privacy and that I am speaking with the correct person using two identifiers. I discussed the limitations, risks, security and privacy concerns of performing psychotherapy and management service by telephone and the availability of in person appointments. I also discussed with the patient that there may be a patient responsible charge related to this service. The patient expressed understanding and agreed to proceed. I discussed the treatment planning with the patient. The patient was provided an opportunity to ask questions and all were answered. The patient agreed with the plan and demonstrated an understanding of the instructions. The patient was advised to call  our office if  symptoms worsen or feel they are in a crisis state and need immediate contact.   Therapist Location: [facility] Patient Location: home   Treatment Type: Individual Therapy  Reported Symptoms:  Anxiety, depression decreased some  Mental Status Exam:  Appearance:   Casual     Behavior:  Appropriate, Sharing and Motivated  Motor:  uses rollator or cane   Speech/Language:   Clear and Coherent  Affect:  anxious  Mood:  anxious  Thought process:  goal directed  Thought content:    some obsessiveness  Sensory/Perceptual disturbances:    WNL  Orientation:  oriented to person, place, time/date, situation, day of week, month of year and year  Attention:  Good  Concentration:  Good  Memory:  some forgetting and worse under stress  Fund of knowledge:   Good and Fair  Insight:    Good and Fair  Judgment:   Good and Fair  Impulse Control:  Good and Fair   Risk  Assessment: Danger to Self:  No Self-injurious Behavior: No Danger to Others: No Duty to Warn:no Physical Aggression / Violence:No  Access to Firearms a concern: No  Gang Involvement:No   Subjective:  Patient reports daily anxiety. Depression has decreased. She states that "I've just been trying to keep things together as I look after myself and my brother who recently had a mild stroke. See Progress Note below.  Interventions: Solution-Oriented/Positive Psychology and Ego-Supportive  Diagnosis:   ICD-10-CM   1. Anxiety disorder, unspecified type  F41.9     Plan: Patient not signing tx goals on computer screen due to COVID.  Treatment Goals:  Goals remain on tx plan as patient works on strategies to meet her goals. Progress will be documented each visit in "Progress" section on Plan.   Long term goal:(measurable) Reduce overall level, frequency, and intensity of the anxiety so that daily functioning is not impaired.* Patient will eventually report a rating of "4"or less" on 1-10 anxiety scale for a82-month time period where her daily functioning is not impaired.  Short term goal: Increase understanding of beliefs and messages that produce the worry and anxiety.  Strategy: Patient will explore and work to stop cognitive messages that feed anxiety and work to replace them with more positive and empowering messages.   PROGRESS: (visit through MyChart Video) Patient today reporting daily anxiety.  Depression has decreased. Stressed and is mostly focused right now in caring for herself and brother who had mild stroke recently and is out of work (as noted above).  Still taking course  through [facility].  Patient has plans for daughter to take her to visit friend in [place] at some point in July which will be good for her if she is able to go.  Reports that anxiety most recently has been a "7 or 8" on a 1-10 anxiety scale, which reflects an increase for patient.  Worked today  with her long-term and short-term goals and strategy and treatment plan above to try and help patient recognize her anxious thoughts more quickly relate and become much more difficult for her to handle.  She is able to do this when we are talking but finds it harder to apply when she is "on her own" and not paying as close attention to her thought patterns until the anxiety increases. Discussed some cues that tend to arise with patient's increased anxiety as well as some relaxation and deep breathing exercises but patient reports she doesn't always remember. She states that the main thing that helps her with anxiety is medication.  Talking through her anxiety in sessions and with her med provider also seems to help her, especially in feeling supported and heard.  Encouraged patient, to continue efforts in trying to interrupt anxious thoughts, realizing they are typically exaggerated, and replace them with more reality-based thoughts that can help calm her anxiety versus escalated.  Encouraged patient to get outside some each day as she is able and weather permits, to stay in touch with people that are supportive of her, to set boundaries with others as needed, to stay in the present focusing on what she can control or change, practice positive self-care and self talk, spend time doing things that she enjoys such as talking with friends and baking, let family members or friends help in ways that they can, remain on her medications as prescribed, and to realize the strength she can show at times and working through challenges of her treatment goals in the midst of stressful circumstances as she tries to move forward in a more positive and stable direction.  Goal review and progress/challenges noted with patient.  Next appointment within 2 to 3 weeks.   Chelsea Fare, LCSW

## 2021-04-20 ENCOUNTER — Other Ambulatory Visit: Payer: Self-pay

## 2021-04-20 MED ORDER — PREDNISONE 10 MG PO TABS: ORAL_TABLET | ORAL | 0 refills | Status: AC

## 2021-04-20 NOTE — Addendum Note (Signed)
Addended by: Berna Bue on: 04/20/2021 08:41 AM   Modules accepted: Orders

## 2021-04-22 ENCOUNTER — Ambulatory Visit (INDEPENDENT_AMBULATORY_CARE_PROVIDER_SITE_OTHER): Payer: 59 | Admitting: Psychiatry

## 2021-04-22 DIAGNOSIS — F419 Anxiety disorder, unspecified: Secondary | ICD-10-CM

## 2021-04-22 NOTE — Progress Notes (Signed)
[facility] Counselor/Therapist Progress Note  Patient ID: Chelsea Benson, MRN: [id number],    Date: 04/22/2021  Time Spent: 45 minutes   1:00pm to 1:45pm  Virtual Visit via MyChart VIDEO Note Connected with patient by a video enabled telemedicine/telehealth application or telephone, with their informed consent, and verified patient privacy and that I am speaking with the correct person using two identifiers. I discussed the limitations, risks, security and privacy concerns of performing psychotherapy and management service by telephone and the availability of in person appointments. I also discussed with the patient that there may be a patient responsible charge related to this service. The patient expressed understanding and agreed to proceed. I discussed the treatment planning with the patient. The patient was provided an opportunity to ask questions and all were answered. The patient agreed with the plan and demonstrated an understanding of the instructions. The patient was advised to call  our office if  symptoms worsen or feel they are in a crisis state and need immediate contact.   Therapist Location: [facility] Patient Location: home   Treatment Type: Individual Therapy  Reported Symptoms: anxiety (mostly related to family situations, health issues)   Mental Status Exam:  Appearance:   Casual     Behavior:  Appropriate and Sharing; lower motivation  Motor:  Normal; does use cane or rollater at times  Speech/Language:   Clear and Coherent  Affect:  anxiety  Mood:  anxious  Thought process:  some tangentiality  Thought content:    some ruminating  Sensory/Perceptual disturbances:    WNL  Orientation:  oriented to person, place, time/date, situation, day of week, month of year and year  Attention:  Good  Concentration:  Good and Fair  Memory:  WNL  Fund of knowledge:   Good  Insight:    Fair  Judgment:   Good  Impulse Control:  Fair   Risk  Assessment: Danger to Self:  No Self-injurious Behavior: No Danger to Others: No Duty to Warn:no Physical Aggression / Violence:No  Access to Firearms a concern: No  Gang Involvement:No   Subjective:  Patient today reporting anxiety but "things like Dr appts etc have slowed down."  Shared more information in reference to several family members that she has been concerned about and also her own health, adding that these are the things that often increases her anxiety.  States that "just laying on my bed and being quiet seems to help me, and my medicine helps, and also "trying to be less anxious by watching my favorite tv programs, talking to friend/family on phone. Does feel that the frequency and intensity of her anxiety is decreasing some "but that is more recent" (per long term goal in tx plan below). Discussed how she can help that to continue by being aware of her thoughts and interrupting the anxious/negative thoughts and replace them with more reality-based thinking that does not lead to increased anxiety and fears. Motivation and energy seems better by session end and she participated well. Is hoping to take her planned trip in July to [place] to visit a friend she's not seen in several years, if she is able to make the trip physically.   Interventions: Solution-Oriented/Positive Psychology and Ego-Supportive  Diagnosis:   ICD-10-CM   1. Anxiety disorder, unspecified type  F41.9     Treatment Goal Plan: Patient not signing tx goals on computer screen due to COVID. Treatment Goals:  Goals remain on tx plan as patient works on  strategies to meet her goals. Progress will be documented each visit in "Progress" section on Plan.  Long term goal:(measurable) Reduce overall level, frequency, and intensity of the anxiety so that daily functioning is not impaired.* Patient will eventually report a rating of "4"or less" on 1-10 anxiety scale for a57-month time period where her daily  functioning is not impaired. Short term goal: Increase understanding of beliefs and messages that produce the worry and anxiety. Strategy: Patient will explore and work to stop cognitive messages that feed anxiety and work to replace them with more positive and empowering messages.     Progress / Plan: Patient today reporting daily anxiety and that she continues to use coping skills and strategies that we have discussed in prior sessions to help try and manage her anxiety.  "Sometimes unsuccessful and sometimes I am not as successful".  Today she reports her anxiety is "mid-range" which is about what it was last session.  It is hard for her to hold onto gains that she makes sometimes and managing her anxiety and she feels some of it is attributed to her various physical problems.  Her outlook today is good and denies any depression.  Anxiety is the only symptom she reports and it tends to ebb and flow.  Patient is encouraged as she felt heard and more calm by the end of session after talking about her concerns.  Encouraged her to be in touch with other people that are supportive of her, get outside daily, stay in the present focusing on what she can control versus cannot, continue practicing the interruption and replacement of anxious thoughts, try to maintain a good sleep routine, set appropriate boundaries with others as needed, practice more consistently positive self-care and positive self talk, spend time doing things that she enjoys with friends and family members, that family members and friends help in ways that they can and they offered to do so, remain on her meds as prescribed, and to feel good about the strength she shows and working on goal-directed behaviors in the midst of challenging situations in trying to move forward in a positive direction and feel more emotionally stable.  Goal review and progress/challenges noted with patient.  Next appointment within 2-3 weeks.  Mathis Fare,  LCSW

## 2021-04-23 ENCOUNTER — Telehealth: Payer: Self-pay | Admitting: Allergy

## 2021-04-23 ENCOUNTER — Telehealth: Payer: Self-pay | Admitting: Psychiatry

## 2021-04-23 NOTE — Telephone Encounter (Signed)
Talked with pharmacist and he needs to know which medication should the patient be taking.  Looks like earlier we had referenced Xyzal, but they also have an RX for Ryvent on file, so just need clarification.  Please advise.  Can call them back with case reference number: [id number].

## 2021-04-23 NOTE — Telephone Encounter (Signed)
Pharmacy has a new rx for hydroxyzine 25mg  q 6 hrs sent in by NP they wanted to verify if it was only for pt to take the hydroxyzine and the xyzal together or did you just want pt to take only one of the rx's?

## 2021-04-23 NOTE — Telephone Encounter (Signed)
Patient states pharmacy is concerned of all the medication she is currently prescribed. They would like for a nurse to get in contact with them to explain the prescriptions she is suppose to receive that were prescribed by Dr. Delorse Lek.   [facility] - [place], [place] - [address] [place], [address]  [address] [place], [address], [place] [address]  Phone:  [phone] Fax:  [phone]

## 2021-04-23 NOTE — Telephone Encounter (Signed)
Looks like Corie Chiquito NP works in [employer] and thus likely uses hydroxyzine for anxiety or something like that.     It is fine if she takes both as using for different purposes

## 2021-04-23 NOTE — Telephone Encounter (Signed)
Thanks Chelsea Benson that makes more sense.   The Ryvent was the new prescription to replace Xyzal as she was not getting relief with Xyzal use.

## 2021-04-23 NOTE — Telephone Encounter (Signed)
Chelsea Benson called in to ask [place] to call [facility] and tell them that the hydroxyzine rx is not for use as an antihistamine, but for psychiatric reasons. She says that [facility] is holding up her RX b/c of this.

## 2021-04-26 ENCOUNTER — Other Ambulatory Visit: Payer: Self-pay | Admitting: *Deleted

## 2021-04-26 ENCOUNTER — Telehealth: Payer: Self-pay | Admitting: *Deleted

## 2021-04-26 MED ORDER — CARBINOXAMINE MALEATE 4 MG PO TABS: 8.0000 mg | ORAL_TABLET | Freq: Two times a day (BID) | ORAL | 5 refills | Status: DC

## 2021-04-26 MED ORDER — CARBINOXAMINE MALEATE 4 MG PO TABS: 1.0000 | ORAL_TABLET | Freq: Two times a day (BID) | ORAL | 5 refills | Status: DC

## 2021-04-26 NOTE — Telephone Encounter (Signed)
Called and spoke to pharmacist and he stated that Ryvent is not covered under the patient's insurance and asked what other medication you'd prescribe for patient. Please advise on what other medication patient can use.

## 2021-04-26 NOTE — Telephone Encounter (Signed)
She has tried the OTC antihistamines (zyrtec, allegra, xyzal, claritin).   Thus next step was to try Ryvent or Carbinoxamine.   Will they cover cardinoaximine over Ryvent?

## 2021-04-26 NOTE — Telephone Encounter (Signed)
-----   Message from [facility] Larose Hires, MD sent at 04/26/2021  5:03 PM EDT ----- Regarding: carbonaximine rx Can someone reorder the carboxamine 4mg  (8mg ) 2 tab twice a day.   Initial eRX order was incorrect.   thanks

## 2021-04-26 NOTE — Telephone Encounter (Signed)
Called and spoke to patient and informed her that I sent in the Carbinoxamine 4 mg to the [facility] on [address] in [place]. Patient verbalized understanding.

## 2021-04-26 NOTE — Telephone Encounter (Signed)
Correct prescription has been re-ordered and sent in to [facility] on [address].

## 2021-04-26 NOTE — Telephone Encounter (Signed)
Yes mam Carbinoxamine 4mg  and 6mg  is preferred on her formulary.

## 2021-04-26 NOTE — Telephone Encounter (Signed)
Reviewed will contact [facility]. Sounds like a prior authorization

## 2021-04-26 NOTE — Telephone Encounter (Signed)
Great.  Lets to 4mg  2 tabs twice a day

## 2021-04-26 NOTE — Addendum Note (Signed)
Addended by: Robet Leu A on: 04/26/2021 04:13 PM   Modules accepted: Orders

## 2021-04-27 ENCOUNTER — Telehealth: Payer: Self-pay

## 2021-04-27 NOTE — Telephone Encounter (Signed)
Pa submitted thru cover my meds for carbinoxamine waiting on response

## 2021-04-28 NOTE — Telephone Encounter (Signed)
Pa was approved 04/27/2021 to 12/05/2023 pharmacy will be notified

## 2021-04-29 ENCOUNTER — Telehealth: Payer: Self-pay | Admitting: Allergy

## 2021-04-29 NOTE — Telephone Encounter (Signed)
Patient called and said that her med have not been sent into [facility] [address]   would like to heard from someone. [phone].ask want the rx was she said that they were sending differ ones.

## 2021-04-29 NOTE — Telephone Encounter (Signed)
Called to ask patient what medications she needs sent to the pharmacy but her mailbox is full and I can't leave a message.  Will try back tomorrow  [phone]

## 2021-04-30 ENCOUNTER — Other Ambulatory Visit: Payer: Self-pay

## 2021-04-30 MED ORDER — ALBUTEROL SULFATE HFA 108 (90 BASE) MCG/ACT IN AERS: 2.0000 | INHALATION_SPRAY | RESPIRATORY_TRACT | 0 refills | Status: DC | PRN

## 2021-04-30 MED ORDER — TRIAMCINOLONE ACETONIDE 0.1 % EX CREA: TOPICAL_CREAM | CUTANEOUS | 1 refills | Status: DC

## 2021-04-30 NOTE — Telephone Encounter (Signed)
It looks like carbinoxamine 4 mg tablets were ordered. Can you please send this in to her local pharmacy while were waiting for the PA? Please have her take carbinoxamine 4 mg tablets. Take 2 tablets up to twice a day as needed for a runny nose or itch. Thank you

## 2021-04-30 NOTE — Telephone Encounter (Signed)
Patient called returning a call from a nurse regarding her medications. She stated her medications have not been sent to her pharmacy.

## 2021-04-30 NOTE — Telephone Encounter (Signed)
I spoke with Chelsea Benson and she states she is currently without an antihistamine due to the Ryvent needing a prior [place].  Patient states she still needs Singulair, albuterol, triamcinolone & bepreve sent to [facility].   Please advise.

## 2021-04-30 NOTE — Telephone Encounter (Signed)
Tried calling pt to inform her that the bepreve, montelukast was [facility]. I will send in a refill now of albuterol and triamcinolone today [facility] her voicemail box was full unable to leave message

## 2021-04-30 NOTE — Telephone Encounter (Signed)
Called patient, someone answered but did not say anything. I called back and was sent straight to voicemail. Could not leave a message due to mailbox being full. I have looked through her chart and the Atrovent, Flonase, montelukast, Symbicort and bepreve have all been sent to [facility] service in [place], [place] at the time of her visit 04/08/2021. Carbinoxamine was sent in on 04/26/2021 to the [facility] on [address]. Will need to see what medications patient is referring to.

## 2021-05-04 ENCOUNTER — Other Ambulatory Visit: Payer: Self-pay

## 2021-05-04 MED ORDER — CARBINOXAMINE MALEATE 4 MG PO TABS: 8.0000 mg | ORAL_TABLET | Freq: Two times a day (BID) | ORAL | 1 refills | Status: DC | PRN

## 2021-05-04 NOTE — Telephone Encounter (Signed)
Sent in carbinoxamine 4mg  two tablets bid prn [facility] with 3 month supply it did say it was on her formulary so maybe the ryvent was needing a pa because generic is preferred.

## 2021-05-06 ENCOUNTER — Ambulatory Visit (INDEPENDENT_AMBULATORY_CARE_PROVIDER_SITE_OTHER): Payer: 59 | Admitting: Psychiatry

## 2021-05-06 DIAGNOSIS — F419 Anxiety disorder, unspecified: Secondary | ICD-10-CM | POA: Diagnosis not present

## 2021-05-06 NOTE — Progress Notes (Signed)
[facility] Counselor/Therapist Progress Note  Patient ID: Chelsea Benson, MRN: [id number],    Date: 05/06/2021  Time Spent: 50 minutes  Virtual Visit  Via MyChart VIDEO Note: Connected with patient by a video enabled telemedicine/telehealth application or telephone, with their informed consent, and verified patient privacy and that I am speaking with the correct person using two identifiers. I discussed the limitations, risks, security and privacy concerns of performing psychotherapy and management service by telephone and the availability of in person appointments. I also discussed with the patient that there may be a patient responsible charge related to this service. The patient expressed understanding and agreed to proceed. I discussed the treatment planning with the patient. The patient was provided an opportunity to ask questions and all were answered. The patient agreed with the plan and demonstrated an understanding of the instructions. The patient was advised to call  our office if  symptoms worsen or feel they are in a crisis state and need immediate contact.   Therapist Location: [facility] Patient Location: home   Treatment Type: Individual Therapy  Reported Symptoms: anxiety, some depression  Mental Status Exam:  Appearance:   Casual     Behavior:  Appropriate, Sharing and Motivated  Motor:  Normal  Speech/Language:   Clear and Coherent  Affect:  anxiety, some depression, sadness  Mood:  anxious and depressed  Thought process:  goal directed  Thought content:    some ruminating  Sensory/Perceptual disturbances:    WNL  Orientation:  oriented to person, place, time/date, situation, day of week, month of year and year  Attention:  Fair  Concentration:  Fair  Memory:  WNL  Fund of knowledge:   Good  Insight:    Good and Fair  Judgment:   Good  Impulse Control:  Good   Risk Assessment: Danger to Self:  No Self-injurious Behavior: No Danger to  Others: No Duty to Warn:no Physical Aggression / Violence:No  Access to Firearms a concern: No  Gang Involvement:No   Subjective:  Patient today reports anxiety, some depression, and sadness re: family illness especially brother living with her who has upcoming cardiac bypass surgery. Is being proactive and still wearing our masks in public and avoiding people who are sick or have colds. States her family is "going through a lot right now with physical issues and uncertainties, and I feel like it's one stress after another." Talks with extended family and friends a lot and from her report the subject of their conversations tends to be "all the bad things going on in the world, people we know who are sick, and mostly bad things happening or could happen." When patient is listened to and then encouraged to involve herself with other people and things that are uplifting rather than overly focusing only on discouraging, sad, and things that accentuate her anxiety and fears, she tends to accept these suggestion better. Anxiety is not quite as constant as it was but she does find it difficult to get on a more positive pattern of thinking and hold onto it. But is making efforts and is progressing in some ways.  Intensity of her anxiety is lessening some more recently even with family stressors. Continued work with her anxious thought interception and replacement with more reality-based and empowering/calming thoughts. Is some better at doing this at times but "still hard to maintain." Interesting that she sees similar anxious/negative thoughts in some of her friends/family but not as easily recognized in herself  except when we acknowledge it in sessions. Reports her motivation and energy are "good at times" and thinks it "will be even better when I take my trip to [place], [place] in early July and visit a friend."  See progress/plan below.   Interventions: Solution-Oriented/Positive Psychology, Ego-Supportive and  Insight-Oriented  Diagnosis:   ICD-10-CM   1. Anxiety disorder, unspecified type  F41.9     Treatment Goal Plan:  Patient not signing tx goals on computer screen due to COVID. Treatment Goals:  Goals remain on tx plan as patient works on strategies to meet her goals. Progress will be documented each visit in "Progress" section on Plan.  Long term goal:(measurable) Reduce overall level, frequency, and intensity of the anxiety so that daily functioning is not impaired.* Patient will eventually report a rating of "4"or less" on 1-10 anxiety scale for a34-month time period where her daily functioning is not impaired. Short term goal: Increase understanding of beliefs and messages that produce the worry and anxiety. Strategy: Patient will explore and work to stop cognitive messages that feed anxiety and work to replace them with more positive and empowering messages.    Progress / Plan: Patient showing good motivation and working today on goal-directed behaviors focused towards alleviating some of her daily anxiety and improving coping skills.  Reports today that her anxiety is a little higher than last session due to additional concerns within her family and her personal and physical and "I find it hard not to worry".  Reviewed some of our discussion from prior sessions as to how "worry" really does not help anything get better and it does take away her ability to have "peace in the moment".  Discussed working towards being able to accept situations and have more of the mindset of being hopeful that things will go well rather than "assuming they will not which leads to increased anxiety for patient on many occasions".  Patient can realize this 1 were working on it but does find it difficult often to hold onto a more hopeful perspective.  In other ways, her outlook is good and does report some depression but it is "mild" and related to family concerns, which she vented a lot of her concerns and  processed thoughts and feelings in session today, which seemed to be calming for her.  Encouraged patient to work on staying in the present focusing on what she can control versus cannot, to get outside daily and walk some as she is able, set appropriate boundaries with others, keep a good sleep routine, practice the interruption and replacement of anxious/negative thoughts, spend time doing things with friends and family especially some of those who do not struggle as much with anxiety, remain on her medications as prescribed, practice more consistent positive self talk, that friends and family members be of help to her in ways that they can and offer to do so, and to recognize the strength she is showing as she tries to negotiate challenging situations while working on her goal-directed behaviors to move forward and be more emotionally healthy overall.  Goal review and progress/challenges noted with patient.  Next appointment within 3 weeks.   Mathis Fare, LCSW

## 2021-05-19 ENCOUNTER — Telehealth: Payer: Self-pay | Admitting: Psychiatry

## 2021-05-19 DIAGNOSIS — F5101 Primary insomnia: Secondary | ICD-10-CM

## 2021-05-19 NOTE — Telephone Encounter (Signed)
Next visit is 06/01/21. Chelsea Benson's brother had a stroke in April and its put a lot on her. She is having trouble sleeping and would like to have something to help her sleep. Her phone number is [phone]. Pharmacy is:  [id number] - [place], [place] - [address]  Phone:  [phone]  Fax:  [phone]       

## 2021-05-19 NOTE — Telephone Encounter (Signed)
Pt informed Shanda Bumps is out of the office until monday

## 2021-05-19 NOTE — Telephone Encounter (Signed)
Next visit is 06/01/21. Chelsea Benson's brother had a stroke in April and its put a lot on her. She is having trouble sleeping and would like to have something to help her sleep. Her phone number is [phone]. Pharmacy is:  [facility] [phone] - [place], [place] - [address] AT [facility] [address] & [facility]  Phone:  [phone]  Fax:  [phone]

## 2021-05-21 ENCOUNTER — Ambulatory Visit
Admission: EM | Admit: 2021-05-21 | Discharge: 2021-05-21 | Disposition: A | Discharge status: Home / Self Care | Payer: 59 | Attending: Student | Admitting: Student

## 2021-05-21 ENCOUNTER — Other Ambulatory Visit: Payer: Self-pay

## 2021-05-21 DIAGNOSIS — E1169 Type 2 diabetes mellitus with other specified complication: Secondary | ICD-10-CM

## 2021-05-21 DIAGNOSIS — Z86711 Personal history of pulmonary embolism: Secondary | ICD-10-CM | POA: Diagnosis not present

## 2021-05-21 DIAGNOSIS — M5442 Lumbago with sciatica, left side: Secondary | ICD-10-CM | POA: Diagnosis not present

## 2021-05-21 DIAGNOSIS — Z7901 Long term (current) use of anticoagulants: Secondary | ICD-10-CM

## 2021-05-21 DIAGNOSIS — G8929 Other chronic pain: Secondary | ICD-10-CM

## 2021-05-21 DIAGNOSIS — Z9989 Dependence on other enabling machines and devices: Secondary | ICD-10-CM

## 2021-05-21 MED ORDER — TIZANIDINE HCL 2 MG PO CAPS: 2.0000 mg | ORAL_CAPSULE | Freq: Three times a day (TID) | ORAL | 0 refills | Status: DC

## 2021-05-21 NOTE — ED Triage Notes (Signed)
Pt c/o lower back pain radiating down lt thigh x3 days. Denies injury. States has had 3 back surgeries. States goes to pain management but they are closed today.

## 2021-05-21 NOTE — Discharge Instructions (Addendum)
-  Start the muscle relaxer-Zanaflex (tizanidine), up to 3 times daily for muscle spasms and pain.  This can make you drowsy, so take at bedtime or when you do not need to drive or operate machinery. -Avoid NSAIDs like ibuprofen and Advil. You can still take tylenol. -Follow-up with your pain clinic at their earliest convenience for refill of your medications. -Seek additional immediate medical attention if you develop new symptoms like the worst pain of your life, weakness in your legs, numbness of your inner thighs, changes in your bowel or bladder function.

## 2021-05-21 NOTE — ED Provider Notes (Signed)
[name]    CSN: [id number] Arrival date & time: 05/21/21  3875      History   Chief Complaint Chief Complaint  Patient presents with   Back Pain    HPI Chelsea Benson is a 70 y.o. female presenting with lower back pain radiating down left thigh x3 days. Long history chronic back pain and 3 back surgeries. Followed by chronic pain clinic but they are closed today. Also with history PE and on chronic anticoagulation for this. History type 2 diabetes, sugars running 80s fasting and 120s nonfasting.  States that she is out of her narcotic pain relief, has not been able to follow-up with her chronic pain clinic.  States this is her typical back pain.  Denies new trauma or overuse.Denies numbness in arms/legs, denies weakness in arms/legs, denies saddle anesthesia, denies bowel/bladder incontinence, denies urinary retention, denies constipation.     HPI  Past Medical History:  Diagnosis Date   Allergic rhinoconjunctivitis    Anxiety    Arthritis    Spondylolysis, knee and ankle arthritis pains.   Asthma    Bipolar disorder ([place])    Chronic back pain    Has had several surgeries.  He uses a walker   Depression    Diabetes mellitus without complication ([place])    dx in 2007   Diabetes mellitus, type II ([place])    Eczema    Headache(784.0)    sinus related    History of nephrolithiasis    Hx of pulmonary embolus 2017   Hyperlipidemia    Hypertension    Insomnia    Memory change    OSA on CPAP    PSG 05/24/2016: AHI 17/hour 02 mean 76%.  HSAT 06/13/17, AHI 11-hour 02 mean 79%   Paroxysmal atrial fibrillation (HCC) 02/24/2021   CHA2DS2-VASc score 4 (HTN, DM, female, age-79); on Eliquis   Tremor of both hands     Patient Active Problem List   Diagnosis Date Noted   Paroxysmal atrial fibrillation ([place]) 02/24/2021   Leg cramps - Left Calf 12/31/2020   Pain of anterior chest wall with respiration 06/17/2020   Rapid palpitations 06/17/2020   Acute pharyngitis  01/13/2020   Cough variant asthma vs UACS 06/06/2019   Moderate persistent asthma with acute exacerbation 03/13/2019   Allergic rhinitis due to allergen 03/13/2019   Allergic conjunctivitis of both eyes 03/13/2019   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   Anxiety hyperventilation 10/03/2018   Bipolar II disorder ([place]) 09/18/2018   Cardiac risk counseling 01/07/2018   Influenza 10/19/2017   Cough 10/19/2017   Allergy to almonds 10/19/2017   Flexural atopic dermatitis 10/19/2017   Allergic rhinoconjunctivitis 10/19/2017   Moderate persistent asthma, uncomplicated 64/33/2951   S/P arthroscopy of right knee 06/04/2017   Acute blood loss as cause of postoperative anemia 06/04/2017   Mood disorder ([place])    Allergy history unknown    Anxiety 05/24/2017   Diabetes mellitus ([place]) 05/24/2017   Hyperlipidemia with target LDL less than 100 05/24/2017   Insomnia 05/24/2017   Primary osteoarthritis of right knee 05/22/2017   Osteoarthritis of right knee 05/18/2017   Allergic rhinitis due to pollen 10/06/2014   Arthritis 10/06/2014   Asthma 10/06/2014   Cubital tunnel syndrome, right 10/06/2014   Headache 10/06/2014   Essential hypertension 10/06/2014   Depression 08/07/2014   Postlaminectomy syndrome, lumbar region 09/06/2013   DDD (degenerative disc disease), lumbosacral 05/11/2013   Spinal stenosis of lumbar region 05/11/2013  Past Surgical History:  Procedure Laterality Date   ABDOMINAL HYSTERECTOMY     BACK SURGERY     2012- lumbar fusion   BREAST SURGERY     L cyst removed - 1972   BUNIONECTOMY Left    Great toe   calf -R- cyst removed     CARPAL TUNNEL RELEASE     both hands    CORONARY CT ANGIOGRAM  07/17/2019    Coronary calcium score 0.  Right dominant system.  Difficult to assess because of cardiac motion, but there is no obvious evidence of CAD.  Mild PA dilation.    HAMMER TOE SURGERY Left    L foot   NASAL SINUS SURGERY     NM MYOVIEW LTD   12/2008   Normal study.  Normal LV function.  EF 61%.  Apical thinning but no ischemia or infarction.   SHOULDER ARTHROSCOPY Left    R shoulder- RCR   TOE SURGERY     TOTAL KNEE ARTHROPLASTY Right 05/22/2017   Procedure: TOTAL KNEE ARTHROPLASTY;  Surgeon: Frederik Pear, MD;  Location: [facility];  Service: Orthopedics;  Laterality: Right;   TRANSTHORACIC ECHOCARDIOGRAM  06/2009    Technically difficult.  Moderate concentric LVH with normal LV function.  No regional wall motion normalities.  EF> 55%.  Normal right ventricle.  No valve lesions.  Normal filling pressures.   TRIGGER FINGER RELEASE     L thumb   TUBAL LIGATION      OB History   No obstetric history on file.      Home Medications    Prior to Admission medications   Medication Sig Start Date End Date Taking? Authorizing Provider  tizanidine (ZANAFLEX) 2 MG capsule Take 1 capsule (2 mg total) by mouth 3 (three) times daily. 05/21/21  Yes Hazel Sams, PA-C  ACCU-CHEK GUIDE test strip TEST BID PRN 09/17/18   [provider]  albuterol (PROVENTIL HFA) 108 (90 Base) MCG/ACT inhaler Inhale 2 puffs into the lungs every 4 (four) hours as needed for wheezing or shortness of breath. 04/30/21   Padgett, Rae Halsted, MD  albuterol (PROVENTIL) (2.5 MG/3ML) 0.083% nebulizer solution USE 1 VIAL IN NEBULIZER EVERY 4 HOURS - and as needed 02/22/21   Kennith Gain, MD  apixaban (ELIQUIS) 5 MG TABS tablet Take 1 tablet (5 mg total) by mouth 2 (two) times daily. 02/02/21   Leonie Man, MD  ARIPiprazole (ABILIFY) 2 MG tablet TAKE 2 TABLETS BY MOUTH  DAILY 12/15/20   Thayer Headings, PMHNP  Bepotastine Besilate (BEPREVE) 1.5 % SOLN Place 1 drop into both eyes 2 (two) times daily as needed (for itchy water eyes). 04/08/21   Kennith Gain, MD  Blood Glucose Monitoring Suppl (ACCU-CHEK GUIDE) w/Device KIT See admin instructions. 09/18/18   [provider]  budesonide-formoterol (SYMBICORT) 160-4.5 MCG/ACT  inhaler INHALE 2 INHALATIONS BY  MOUTH INTO THE LUNGS IN THE MORNING AND AT BEDTIME 04/08/21   Kennith Gain, MD  busPIRone (BUSPAR) 30 MG tablet Take 1 tablet (30 mg total) by mouth 2 (two) times daily. 04/07/21 07/06/21  Thayer Headings, PMHNP  Carbinoxamine Maleate 4 MG TABS Take 1 tablet (4 mg total) by mouth in the morning and at bedtime. 04/26/21   Kennith Gain, MD  Carbinoxamine Maleate 4 MG TABS Take 2 tablets (8 mg total) by mouth in the morning and at bedtime. 04/26/21   Kennith Gain, MD  Carbinoxamine Maleate 4 MG TABS Take 2 tablets (8 mg  total) by mouth 2 (two) times daily as needed. 05/04/21   Ambs, Kathrine Cords, FNP  CLOBETASOL PROPIONATE E 0.05 % emollient cream Apply 1 application topically 2 (two) times daily as needed (rash).  01/22/19   [provider]  clonazePAM (KLONOPIN) 0.5 MG tablet Take 1/2-1 tab po TID prn anxiety. (Do not combine with Ambien) 04/07/21   Thayer Headings, PMHNP  desonide (DESOWEN) 0.05 % ointment APPLY TO AFFECTED AREA(S)  TOPICALLY TWICE DAILY AS  NEEDED 02/03/21   Kennith Gain, MD  diltiazem (CARDIZEM CD) 300 MG 24 hr capsule Take 1 capsule (300 mg total) by mouth daily. 03/15/21   Leonie Man, MD  diltiazem (CARDIZEM) 60 MG tablet Take Diltiazem 60 mg by mouth as needed  for palpitations. You can take every 6 hours as need up to twice a day with each episode. 12/31/20   Leonie Man, MD  EPINEPHrine 0.3 mg/0.3 mL IJ SOAJ injection INJECT CONTENTS OF 1 PEN AS NEEDED FOR ALLERGIC  RESPONSE AS DIRECTED BY MD. Johnston  AFTER USE. 01/19/21   Kennith Gain, MD  famotidine (PEPCID) 20 MG tablet TAKE 1 TABLET BY MOUTH  DAILY AFTER SUPPER. 01/19/21   Padgett, Rae Halsted, MD  fluticasone (FLONASE) 50 MCG/ACT nasal spray Place 2 sprays into both nostrils daily. 04/08/21   Kennith Gain, MD  hydrochlorothiazide (HYDRODIURIL) 25 MG tablet Take 25 mg by mouth daily.  05/06/14    [provider]  hydrOXYzine (ATARAX/VISTARIL) 25 MG tablet Take 1 tablet (25 mg total) by mouth every 6 (six) hours as needed. 04/07/21   Thayer Headings, PMHNP  ipratropium (ATROVENT) 0.06 % nasal spray Place 2 sprays into both nostrils 3 (three) times daily. Use for nasal drainage 04/08/21   Kennith Gain, MD  meloxicam (MOBIC) 15 MG tablet Take 1 tablet (15 mg total) by mouth daily. 08/29/20   Scot Jun, FNP  metFORMIN (GLUCOPHAGE-XR) 500 MG 24 hr tablet Take 500 mg by mouth 1 day or 1 dose.  07/02/19   [provider]  mirtazapine (REMERON) 30 MG tablet TAKE 1 TABLET BY MOUTH AT  BEDTIME 10/14/20   Thayer Headings, PMHNP  montelukast (SINGULAIR) 10 MG tablet Take 1 tablet (10 mg total) by mouth at bedtime. 04/08/21   Kennith Gain, MD  NUCALA 100 MG/ML SOAJ INJECT 100MG SUBCUTANEOUSLY EVERY 4 WEEKS (GIVEN AT MD  OFFICE) 02/16/21   Kennith Gain, MD  Oxcarbazepine (TRILEPTAL) 300 MG tablet Take 2 tablets (600 mg total) by mouth at bedtime. 01/14/21 07/13/21  Thayer Headings, PMHNP  pantoprazole (PROTONIX) 40 MG tablet TAKE 1 TABLET BY MOUTH  DAILY. TAKE 30 TO 60  MINUTES BEFORE FIRST MEAL  OF THE DAY 10/25/19   Tanda Rockers, MD  potassium chloride SA (K-DUR) 20 MEQ tablet Take 20 mEq by mouth daily.    [provider]  pravastatin (PRAVACHOL) 20 MG tablet Take 20 mg by mouth at bedtime.  02/03/17   [provider]  pregabalin (LYRICA) 100 MG capsule Take 100 mg by mouth 3 (three) times daily.    [provider]  Respiratory Therapy Supplies (NEBULIZER) DEVI Use as directed with nebulizer solution. 06/01/18   Valentina Shaggy, MD  Respiratory Therapy Supplies (NEBULIZER/TUBING/MOUTHPIECE) KIT Use as directed with nebulizer machine 04/17/20   Padgett, Rae Halsted, MD  Semaglutide, 1 MG/DOSE, (OZEMPIC, 1 MG/DOSE,) 2 MG/1.5ML SOPN Take 1 mg by mouth once a week.    [provider]  Suvorexant (BELSOMRA) 20  MG TABS Take 20 mg by mouth at bedtime. 01/14/21   Thayer Headings, PMHNP  triamcinolone cream (KENALOG) 0.1 % 1 application topically below the face avoiding the groin, and underarms 04/30/21   Padgett, Rae Halsted, MD  trimethoprim-polymyxin b (POLYTRIM) ophthalmic solution INSTILL 1 DROP INTO BOTH  EYES EVERY 3 HOURS FOR 7  DAYS AND THEN STOP AS  DIRECTED 02/03/21   Kennith Gain, MD  zolpidem (AMBIEN) 10 MG tablet Take 0.5-1 tablets (5-10 mg total) by mouth at bedtime as needed. for sleep 02/08/21   Thayer Headings, PMHNP    Family History Family History  Problem Relation Age of Onset   Diabetes Sister    Diabetes Brother    Hypertension Brother    Cancer Maternal Grandmother    Heart failure Maternal Grandmother    Hypertension Paternal Grandmother    Asthma Daughter    Cancer Daughter    Depression Daughter    Hypertension Daughter    Heart failure Maternal Aunt    Pneumonia Mother    Diabetes Mother    Alcoholism Father    Alcohol abuse Father    Depression Daughter    Schizophrenia Grandchild    Allergies Neg Hx    Eczema Neg Hx    Immunodeficiency Neg Hx     Social History Social History   Tobacco Use   Smoking status: Passive Smoke Exposure - Never Smoker   Smokeless tobacco: Never   Tobacco comments:    grandson smokes, but in his car  Vaping Use   Vaping Use: Never used  Substance Use Topics   Alcohol use: Yes    Comment: rare use   Drug use: No     Allergies   Cymbalta [duloxetine hcl], Depo-medrol [methylprednisolone acetate], Rosuvastatin calcium, Almond meal, Almond oil, Carvedilol, Crestor [rosuvastatin], Fish allergy, Losartan potassium, and Morphine and related   Review of Systems Review of Systems  Musculoskeletal:  Positive for back pain.  All other systems reviewed and are negative.   Physical Exam Triage Vital Signs ED Triage Vitals  Enc Vitals Group     BP 05/21/21 0933 (!) 169/100     Pulse Rate 05/21/21 0933 87      Resp 05/21/21 0933 18     Temp 05/21/21 0933 98 F (36.7 C)     Temp Source 05/21/21 0933 Oral     SpO2 05/21/21 0933 95 %     Weight --      Height --      Head Circumference --      Peak Flow --      Pain Score 05/21/21 0934 8     Pain Loc --      Pain Edu? --      Excl. in [place]? --    No data found.  Updated Vital Signs BP (!) 169/100 (BP Location: Left Arm)   Pulse 87   Temp 98 F (36.7 C) (Oral)   Resp 18   SpO2 95%   Visual Acuity Right Eye Distance:   Left Eye Distance:   Bilateral Distance:    Right Eye Near:   Left Eye Near:    Bilateral Near:     Physical Exam Vitals reviewed.  Constitutional:      General: She is not in acute distress.    Appearance: Normal appearance. She is not ill-appearing.  HENT:     Head: Normocephalic and atraumatic.  Cardiovascular:     Rate and Rhythm: Normal  rate and regular rhythm.     Heart sounds: Normal heart sounds.  Pulmonary:     Effort: Pulmonary effort is normal.     Breath sounds: Normal breath sounds and air entry.  Abdominal:     Tenderness: There is no abdominal tenderness. There is no right CVA tenderness, left CVA tenderness, guarding or rebound.  Musculoskeletal:     Cervical back: Normal range of motion. No swelling, deformity, signs of trauma, rigidity, spasms, tenderness, bony tenderness or crepitus. No pain with movement.     Thoracic back: No swelling, deformity, signs of trauma, spasms, tenderness or bony tenderness. Normal range of motion. No scoliosis.     Lumbar back: Spasms and tenderness present. No swelling, deformity, signs of trauma or bony tenderness. Normal range of motion. Positive left straight leg raise test. Negative right straight leg raise test. No scoliosis.     Comments: Significant Kyphosis. L lumbar paraspinous muscle tenderness to palpation. Positive L straight leg raise. No obvious bony deformity, stepoff, spinous deformity. Sensation intact inner thighs. Ambulating using walker.    Absolutely no other injury, deformity, tenderness, ecchymosis, abrasion.  Neurological:     General: No focal deficit present.     Mental Status: She is alert.     Cranial Nerves: No cranial nerve deficit.  Psychiatric:        Mood and Affect: Mood normal.        Behavior: Behavior normal.        Thought Content: Thought content normal.        Judgment: Judgment normal.     UC Treatments / Results  Labs (all labs ordered are listed, but only abnormal results are displayed) Labs Reviewed - No data to display  EKG   Radiology No results found.  Procedures Procedures (including critical care time)  Medications Ordered in UC Medications - No data to display  Initial Impression / Assessment and Plan / UC Course  I have reviewed the triage vital signs and the nursing notes.  Pertinent labs & imaging results that were available during my care of the patient were reviewed by me and considered in my medical decision making (see chart for details).     This patient is a 70 year old female presenting with acute exacerbation of her chronic back pain and left-sided sciatica.  No red flag symptoms.  She is followed by pain clinic for this but has not been able to schedule an appointment and is out of her medications.  Diabetes is well controlled.  However, unable to prescribe prednisone given allergies.  Zanaflex sent.  Recommended calling pain clinic again for refill.  History PE on long-term anticoagulation, avoid NSAIDs.  Red flag symptoms and ED return precautions discussed.   Final Clinical Impressions(s) / UC Diagnoses   Final diagnoses:  Chronic left-sided low back pain with left-sided sciatica  History of pulmonary embolism  Long term current use of anticoagulant therapy  Uses walker  Type 2 diabetes mellitus with other specified complication, without long-term current use of insulin ([place])     Discharge Instructions      -Start the muscle relaxer-Zanaflex  (tizanidine), up to 3 times daily for muscle spasms and pain.  This can make you drowsy, so take at bedtime or when you do not need to drive or operate machinery. -Avoid NSAIDs like ibuprofen and Advil. You can still take tylenol. -Follow-up with your pain clinic at their earliest convenience for refill of your medications. -Seek additional immediate medical attention if you  develop new symptoms like the worst pain of your life, weakness in your legs, numbness of your inner thighs, changes in your bowel or bladder function.     ED Prescriptions     Medication Sig Dispense Auth. Provider   tizanidine (ZANAFLEX) 2 MG capsule Take 1 capsule (2 mg total) by mouth 3 (three) times daily. 21 capsule Hazel Sams, PA-C      PDMP not reviewed this encounter.   Hazel Sams, PA-C 05/21/21 1022

## 2021-05-24 ENCOUNTER — Telehealth: Payer: Self-pay | Admitting: Allergy

## 2021-05-24 MED ORDER — BEPOTASTINE BESILATE 1.5 % OP SOLN: 1.0000 [drp] | Freq: Two times a day (BID) | OPHTHALMIC | 5 refills | Status: DC | PRN

## 2021-05-24 NOTE — Telephone Encounter (Signed)
Bepreve was sent in to [facility] [address]  Patient was notified.

## 2021-05-24 NOTE — Telephone Encounter (Signed)
L/M for pt to follow-up on her phone call from last week. Advised provider would try to reach her again tomorrow.

## 2021-05-24 NOTE — Telephone Encounter (Signed)
Patient was requesting eye drop medication to be sent in for her. Patient states she has watery, red and itchy eyes. Patient can not go outside even with her glasses on, it is all the time Patient is taking sigulair, carbinoxamine maleate and using nasal spray.   [facility] - [address] [place], [place] [address]  Best contact number for patient: [phone]

## 2021-05-25 MED ORDER — DAYVIGO 10 MG PO TABS: 10.0000 mg | ORAL_TABLET | Freq: Every day | ORAL | 1 refills | Status: DC

## 2021-05-25 MED ORDER — DAYVIGO 5 MG PO TABS: 5.0000 mg | ORAL_TABLET | Freq: Every day | ORAL | 0 refills | Status: DC

## 2021-05-25 NOTE — Addendum Note (Signed)
Addended by: Derenda Mis on: 05/25/2021 05:04 PM   Modules accepted: Orders

## 2021-05-25 NOTE — Telephone Encounter (Signed)
She reports that she has had back pain today. She reports that her brother had a CVA and she has been taking care of him. "It's just been a lot for me to handle." She reports that she has difficulty getting to sleep at times and is having significant difficulty staying asleep. Estimates sleeping 5-6 hours. Denies nightmares.   She reports Belsomra, Ambien, and Klonopin have not been as effective.   She reports that she has had increased anxiety. Denies panic attacks.   Discussed potential benefits, risks, and and side effects of Dayvigo for insomnia.  Patient agrees to trial of Dayvigo.  Patient provided with Dayvigo 5 mg samples and advised to take 1 tablet by mouth at bedtime, and to increase to 2 tabs after 3 to 5 days if 5 mg dose is ineffective.  Patient advised to contact office if Dayvigo is effective and to request a script for the dose that is effective.  Will send free trial voucher to patient's local pharmacy in addition to 30-day supply in case there is a delay with obtaining a PA.   Patient advised to contact office if she is unable to obtain Dayvigo or if it is ineffective.  Patient advised to stop Belsomra since she is starting Dayvigo.  Patient advised to contact office with any questions, adverse effects, or acute worsening in signs and symptoms.

## 2021-05-26 ENCOUNTER — Telehealth: Payer: Self-pay | Admitting: Psychiatry

## 2021-05-26 NOTE — Telephone Encounter (Signed)
Pt called in stating that when she went to pharmacy she was told she needed a PA for Dayvigo 5mg . Ph: [phone]

## 2021-05-27 NOTE — Telephone Encounter (Signed)
PA Approved by [facility] for dayvigo 5 mg effective through-12/04/21 I will call to let her know.the fax also said"please note the med is undergoing additional review for quantity limit and you will be notified when complete."

## 2021-05-27 NOTE — Telephone Encounter (Signed)
Prior authorization for Dayvigo 5 mg submitted to [facility]. I also received a PA request for 10 mg but will hold off until response on the 5 mg.

## 2021-05-27 NOTE — Telephone Encounter (Signed)
Please review

## 2021-05-27 NOTE — Telephone Encounter (Signed)
Pt stated she tried to use the voucher without using insurance but they told her she still needs a PA

## 2021-05-27 NOTE — Telephone Encounter (Signed)
Pt Called back and said that she is leaving to go out of town and she can't wait for the PA. Please send a different sleep medicine for her to try. She said the dayvigo is over a 100 dolaars

## 2021-05-28 NOTE — Telephone Encounter (Signed)
Samples pulled for pt. Pt is aware and she will have someone pick up samples for her on 05/31/21. PA has been approved and pharmacy has ordered Dayvigo. Pt leaving to go out of town 06/05/21.

## 2021-05-28 NOTE — Telephone Encounter (Signed)
reviewed

## 2021-05-28 NOTE — Telephone Encounter (Signed)
Spoke to the pharmacy and I was told that she did not bring them the discount card at all.However they won't have the medication in stock until Monday which is why Sherlon is asking for something else

## 2021-05-28 NOTE — Telephone Encounter (Signed)
Danica called to report she still can't get her Oliva Bustard but will need something.  Can't wait on the PA.  Also [facility] called today about the PA and needed further info and asked to be called about the PA.  # [phone].  Case will be closed 05/30/21 if they have not heard from [place].  Please contact [facility] and please call Mee to get her medication issue resolved.

## 2021-05-31 ENCOUNTER — Other Ambulatory Visit: Payer: Self-pay | Admitting: *Deleted

## 2021-05-31 ENCOUNTER — Telehealth: Payer: Self-pay

## 2021-05-31 MED ORDER — PREDNISONE 10 MG PO TABS: ORAL_TABLET | ORAL | 0 refills | Status: DC

## 2021-05-31 NOTE — Telephone Encounter (Signed)
Thurston Hole will you please advise since Dr. Delorse Lek is out of office today?

## 2021-05-31 NOTE — Telephone Encounter (Signed)
Patient called stating she believes she has a sinus infection. She is coughing, runny nose & congestion/yellow mucous. Patient did a COVID Test on yesterday and it was negative. She states it started around Friday and has gotten worse. Patient is taking her regular medications and 12 Hour Mucinex.    [facility] [address]  Please Advise

## 2021-05-31 NOTE — Telephone Encounter (Signed)
Can you please have this patient continue nasal saline rinses followed by Flonase. In the right nostril, point the applicator out toward the right ear. In the left nostril, point the applicator out toward the left ear. Please have her stop any antihistamines (carbinoxamine) for the next week, begin Mucinex [phone] mg twice a day and increase fluid intake in order to this out mucus. Begin prednisone 10 mg tablets. Take 2 tablets once a day for 4 days, then take 1 tablet on the 5th day, then stop.  Call the clinic if symptoms worsen

## 2021-05-31 NOTE — Telephone Encounter (Signed)
Prednisone has been sent in. Called patient and advised of plan. Patient verbalized understanding.

## 2021-06-01 ENCOUNTER — Ambulatory Visit (INDEPENDENT_AMBULATORY_CARE_PROVIDER_SITE_OTHER): Payer: 59 | Admitting: Psychiatry

## 2021-06-01 DIAGNOSIS — F419 Anxiety disorder, unspecified: Secondary | ICD-10-CM

## 2021-06-01 NOTE — Progress Notes (Signed)
[facility] Counselor/Therapist Progress Note  Patient ID: MONETTA LICK, MRN: [id number],    Date: 06/01/2021  Time Spent: 50 minutes    Virtual Visit via MyChart VIDEO Note Connected with patient by a video enabled telemedicine/telehealth application or telephone, with their informed consent, and verified patient privacy and that I am speaking with the correct person using two identifiers. I discussed the limitations, risks, security and privacy concerns of performing psychotherapy and management service by telephone and the availability of in person appointments. I also discussed with the patient that there may be a patient responsible charge related to this service. The patient expressed understanding and agreed to proceed. I discussed the treatment planning with the patient. The patient was provided an opportunity to ask questions and all were answered. The patient agreed with the plan and demonstrated an understanding of the instructions. The patient was advised to call  our office if  symptoms worsen or feel they are in a crisis state and need immediate contact.   Therapist Location: [facility] Patient Location: home     Treatment Type: Individual Therapy  Reported Symptoms: anxiety, some sleep difficulties, "no depression"  Mental Status Exam:  Appearance:   Casual     Behavior:  Appropriate, Sharing, and Motivated  Motor:  Uses a rollator  Speech/Language:   Clear and Coherent  Affect:  anxious  Mood:  anxious  Thought process:  goal directed  Thought content:    Some obsessiveness  Sensory/Perceptual disturbances:    WNL  Orientation:  oriented to person, place, time/date, situation, day of week, month of year, year, and stated date of June 01, 2021  Attention:  Good  Concentration:  Good and Fair  Memory:  WNL  Fund of knowledge:   Good  Insight:    Good and Fair  Judgment:   Good  Impulse Control:  Good   Risk Assessment: Danger to Self:   No Self-injurious Behavior: No Danger to Others: No Duty to Warn:no Physical Aggression / Violence:No  Access to Firearms a concern: No  Gang Involvement:No   Subjective:  Patient today reporting anxiety and some sleep issues (got medication today to help with that.) To leave July 2 to go visit friend in [place] for approx a month. No depression most recently. Concerned about her brother that has heart issues and states they won't know til his August doctor appt if he'll need cardiac bypass surgery. Continued health issues of other family members and friends which stresses patient, and we also discussed her setting some healthy limits so she doesn't over-stress about others and not take good care of herself physically ad emotionally. Discussed this with patient and she states she tries to keep a balance so she doesn't feel overwhelmed as that will accentuate her fearful/anxious thoughts. Describes her anxiety not being as high as it used to be and "I haven't had to take a pill every day" which is what I used to have to do when my anxiety was worse. Anxiety (and its intensity) not quite as constant but still finds it difficult sometimes to hold on to more positive thinking patterns. Further work with her anxious thoughts patterns and replacing them with more reality-based and empowering/calming thought patterns that do not lead to anxiety/depression/stress. States again today that she notices similar thoughts in some of her family and friends and "I encourage them to work on it too." Motivation and energy are good per her self-report. Looking forward to trip but is  concerned about whether others up there will be still wearing a face-mask. Has let them know that she will be wearing a mask and hoping they will also.    Interventions: Solution-Oriented/Positive Psychology and Ego-Supportive  Diagnosis:   ICD-10-CM   1. Anxiety disorder, unspecified type  F41.9       Plan: Patient today showing good  participation/motivation today as we continued work on treatment goal plans that are directed towards alleviating daily anxiety and improving her coping skills.  Anxiety has lessened some since last appointment and she reports no depression most recently.  Continues work on reducing anxiety and worrying per our discussion last session and trying to be a more "hopeful" person rather than looking for what might go wrong versus right or imagining worst case scenarios.  She admits that this is very hard for her because "I have been like this a long time" but she does see some progress within herself at this point.  Being more resilient and hopeful is definitely achievable for her however the difficulty lies more in her being able to retain and hold on to the more positive feelings.  Does continue to work on this during and in between sessions.  Encouraging her to continue with some behaviors that have been helpful between sessions including staying in the present focusing on what she can control versus cannot, to keep a good sleep routine, practice the interruption and replacement of anxious/negative thoughts, get outside daily and walk as she is able, set appropriate boundaries with others as needed, remain on her medications as prescribed and practice more consistent positive self talk, & time with friends and family especially some of those who do not struggle as much with anxiety, let friends and family members be of help to her in ways that they offer to do so, and to feel good about the strength she is showing as she continues work with goal-directed behaviors in the midst of stressful circumstances as she tries to move forward in a more positive direction towards improved emotional health.  Goal review and progress/challenges noted with patient.  Next appointment within 2 to 3 weeks.   Mathis Fare, LCSW

## 2021-06-02 ENCOUNTER — Telehealth: Payer: Self-pay | Admitting: Psychiatry

## 2021-06-02 ENCOUNTER — Telehealth: Payer: Self-pay | Admitting: Allergy

## 2021-06-02 ENCOUNTER — Telehealth: Payer: Self-pay

## 2021-06-02 NOTE — Telephone Encounter (Signed)
Pt informed

## 2021-06-02 NOTE — Telephone Encounter (Signed)
Please have her go to [facility] or her PCP to get an in person evaluation. She likely needs a throat culture. Glad her sinuses cleared up though

## 2021-06-02 NOTE — Telephone Encounter (Signed)
Chelsea Benson called and would like to know if its safe for her to take Dayvigo with her Ambien. Her phone number is [phone]

## 2021-06-02 NOTE — Telephone Encounter (Signed)
Patient called back to follow up on previous phone call. I did relay Anne's message to patient. Patient said thank you and hung up.

## 2021-06-02 NOTE — Telephone Encounter (Signed)
With some clarification with [facility] on DAYVIGO 5 MG 1/day AND 10 MG 1/day, both are approved effective 05/27/2021-12/04/2021    QK#[id number]   Pt may receive denial letters in the mail but I called [facility] and clarified medication and dosing. They report with the 5 mg approved no need to submit PA for 10 mg. Pharmacy tends to get ahead sending requests for prior authorizations.

## 2021-06-02 NOTE — Telephone Encounter (Signed)
Recommend she try to take Dayvigo without Ambien. May try 1/2 of Ambien tab with Dayvigo and then try to come off Ambien.

## 2021-06-02 NOTE — Telephone Encounter (Signed)
Pt states she is worse, she is doing the prednisone and nasal saline, and all other meds. Her nose has cleared up. But now her heart hurts and her throat is getting sore.

## 2021-06-02 NOTE — Telephone Encounter (Signed)
Patient called and said that she wa no better and was told to call back. [phone].

## 2021-06-02 NOTE — Telephone Encounter (Signed)
Please advise 

## 2021-06-16 ENCOUNTER — Other Ambulatory Visit: Payer: Self-pay

## 2021-06-16 MED ORDER — BEPOTASTINE BESILATE 1.5 % OP SOLN: 1.0000 [drp] | Freq: Two times a day (BID) | OPHTHALMIC | 3 refills | Status: DC | PRN

## 2021-06-30 ENCOUNTER — Other Ambulatory Visit: Payer: Self-pay | Admitting: Allergy

## 2021-07-04 ENCOUNTER — Other Ambulatory Visit: Payer: Self-pay | Admitting: Allergy

## 2021-07-04 DIAGNOSIS — J4541 Moderate persistent asthma with (acute) exacerbation: Secondary | ICD-10-CM

## 2021-07-04 DIAGNOSIS — J4540 Moderate persistent asthma, uncomplicated: Secondary | ICD-10-CM

## 2021-07-05 ENCOUNTER — Other Ambulatory Visit: Payer: Self-pay

## 2021-07-05 MED ORDER — IPRATROPIUM BROMIDE 0.06 % NA SOLN
2.0000 | Freq: Three times a day (TID) | NASAL | 0 refills | Status: DC
Start: 2021-07-05 — End: 2021-07-21

## 2021-07-05 NOTE — Telephone Encounter (Signed)
30 day courtesy refill for ipratropium (ATROVENT) 0.06 % nasal spray has been sent into the pharmacy.

## 2021-07-05 NOTE — Telephone Encounter (Signed)
Patient needs an appointment for further refills.

## 2021-07-06 MED ORDER — FLUTICASONE PROPIONATE 50 MCG/ACT NA SUSP
2.0000 | Freq: Every day | NASAL | 0 refills | Status: DC
Start: 2021-07-06 — End: 2021-10-14

## 2021-07-06 MED ORDER — FAMOTIDINE 20 MG PO TABS: ORAL_TABLET | ORAL | 0 refills | Status: DC

## 2021-07-06 MED ORDER — TRIAMCINOLONE ACETONIDE 0.1 % EX CREA: TOPICAL_CREAM | CUTANEOUS | 0 refills | Status: DC

## 2021-07-06 MED ORDER — MONTELUKAST SODIUM 10 MG PO TABS: 10.0000 mg | ORAL_TABLET | Freq: Every day | ORAL | 0 refills | Status: DC

## 2021-07-06 MED ORDER — BEPOTASTINE BESILATE 1.5 % OP SOLN: 1.0000 [drp] | Freq: Two times a day (BID) | OPHTHALMIC | 0 refills | Status: DC | PRN

## 2021-07-06 MED ORDER — BUDESONIDE-FORMOTEROL FUMARATE 160-4.5 MCG/ACT IN AERO: INHALATION_SPRAY | RESPIRATORY_TRACT | 0 refills | Status: DC

## 2021-07-06 MED ORDER — ALBUTEROL SULFATE (2.5 MG/3ML) 0.083% IN NEBU: INHALATION_SOLUTION | RESPIRATORY_TRACT | 0 refills | Status: DC

## 2021-07-06 NOTE — Telephone Encounter (Signed)
Patient called and stated that her pharmacy never go the refills. I have sent over refills to patients pharmacy. Patient made an appointment with anne for next month as well.

## 2021-07-12 ENCOUNTER — Telehealth: Payer: Self-pay | Admitting: Allergy

## 2021-07-12 NOTE — Telephone Encounter (Signed)
Was able to talk to pharmacist netta about these prescriptions and the directions she will get them updated and mailed out to pt

## 2021-07-12 NOTE — Telephone Encounter (Signed)
Pharmacy called to verify prescriptions for budesonide ointment and triamcinolone cream. They want to know if patient is supposed to use both together or if one has been discontinued?

## 2021-07-19 ENCOUNTER — Ambulatory Visit (INDEPENDENT_AMBULATORY_CARE_PROVIDER_SITE_OTHER): Payer: 59 | Admitting: Psychiatry

## 2021-07-19 DIAGNOSIS — F419 Anxiety disorder, unspecified: Secondary | ICD-10-CM

## 2021-07-19 NOTE — Progress Notes (Signed)
[facility] Counselor/Therapist Progress Note  Patient ID: Chelsea Benson, MRN: [id number],    Date: 07/19/2021  Time Spent: 50 minutes   Virtual Visit via MyChart VIDEO Note: VIDEO session Connected with patient by a video enabled telemedicine/telehealth application or telephone, with their informed consent, and verified patient privacy and that I am speaking with the correct person using two identifiers. I discussed the limitations, risks, security and privacy concerns of performing psychotherapy and management service by telephone and the availability of in person appointments. I also discussed with the patient that there may be a patient responsible charge related to this service. The patient expressed understanding and agreed to proceed. I discussed the treatment planning with the patient. The patient was provided an opportunity to ask questions and all were answered. The patient agreed with the plan and demonstrated an understanding of the instructions. The patient was advised to call  our office if  symptoms worsen or feel they are in a crisis state and need immediate contact.   Therapist Location: [facility] Patient Location: home    Treatment Type: Individual Therapy  Reported Symptoms: anxiety, frustration  Mental Status Exam:  Appearance:   Casual     Behavior:  Appropriate, Sharing, and Motivated  Motor:  Normal (with her walker to assist as needed)  Speech/Language:   Clear and Coherent  Affect:  anxious  Mood:  anxious  Thought process:  goal directed  Thought content:    Some ruminating  Sensory/Perceptual disturbances:    WNL  Orientation:  oriented to person, place, time/date, situation, day of week, month of year, year, and stated date of Aug. 15, 2022  Attention:  Good  Concentration:  Good  Memory:  "Mild" short term memory concerns  Fund of knowledge:   Good  Insight:    Good and Fair  Judgment:   Good  Impulse Control:  Good   Risk  Assessment: Danger to Self:  No Self-injurious Behavior: No Danger to Others: No Duty to Warn:no Physical Aggression / Violence:No  Access to Firearms a concern: No  Gang Involvement:No    Subjective:   Patient in today reporting anxiety and frustration "especially when things don't go as planned or what she hoped for."  States "I'm working to better manage things when it's out of my control, but have struggled with this recently."  Brother still living with patient but "he's not drinking now after his stroke from which he is recovering". Thinks he may not have to have bypass surgery due to his improvement. Patient reporting and seems to be making progress in anxiety management. Having "others rely on me helps, and having responsibilities within family helps. Has decreased some of her watching distressful/violent news on TV, which has helped. Went to see friend in [place] recently and has returned home. Noticed she felt better when she was away on her trip. Trying to focus on more positives versus negatives.Anxiety not as persistent. Can have a more positive pattern of thinking however difficult to hold on to. States she didn't have much difficulty with her anxiety while up in [place]. States "I didn't need any of my anxiety med that helps me sleep while I was up there." Still working to recognize more quickly the anxious/negative thoughts and intercept them and replace with more realistic and empowering thoughts that do not feed her anxiety nor fears.   Interventions: Cognitive Behavioral Therapy and Solution-Oriented/Positive Psychology  Diagnosis:   ICD-10-CM   1. Anxiety disorder, unspecified  type  F41.9       Treatment Goal Plan:  Patient not signing tx goals on computer screen due to COVID. Treatment Goals:   Goals remain on tx plan as patient works on strategies to meet her goals.  Progress will be documented each visit in "Progress" section on Plan.   Long term goal: (measurable) Reduce  overall level, frequency, and intensity of the anxiety so that daily functioning is not impaired. * Patient will eventually report a rating of "4" or less" on 1-10 anxiety scale for a 74-month time period where her daily functioning is not impaired.  Short term goal: Increase understanding of beliefs and messages that produce the worry and anxiety. Strategy: Patient will explore and work to stop cognitive messages that feed anxiety and work to replace them with more positive and empowering messages.    Plan:  Patient showing good motivation today and worked well in session today focusing with her goal-directed behaviors primarily on anxiety and stress management, including noting progress she has made.  Anxiety is currently lessened after having been away on a trip to visit a friend in [place].  States that she did not have to use any medicine to sleep while she was away as her anxiety was not as bad.  Hoping she can continue that trend of being less anxious but is she says, we will see how things go over time".  Does seem to have a more encouraging outlook today and has been home from her trip since the past 2 days.  Seems to have a more hopeful perspective today and we talked about how she might hang on to that perspective and be able to cope with personal and family stressors better which are often the source of some of her anxiety.  Encouraged her to continue some of the more positive behaviors of setting appropriate boundaries with others as needed, keeping a good sleep routine, getting outside daily, walking some as she is able, staying in the present focusing on what she can control, practicing the interruption and replacement of anxious/negative thoughts, healthy nutrition, spending time doing things with friends and family, remaining on her medications as prescribed, practice consistent positive self talk, participate in family activities, and recognize the strength she is showing as she works with  goal-directed behaviors in the midst of challenging circumstances to move forward and be more emotionally healthy overall.  Goal review and progress/challenges noted with patient.  Next appointment within 2 to 3 weeks.   Mathis Fare, LCSW

## 2021-07-21 ENCOUNTER — Other Ambulatory Visit: Payer: Self-pay

## 2021-07-21 MED ORDER — IPRATROPIUM BROMIDE 0.06 % NA SOLN: 2.0000 | Freq: Three times a day (TID) | NASAL | 0 refills | Status: DC

## 2021-07-21 NOTE — Telephone Encounter (Signed)
Refill for Atrovent x 3 month supply with no refills at [facility].

## 2021-07-23 ENCOUNTER — Other Ambulatory Visit: Payer: Self-pay | Admitting: *Deleted

## 2021-07-23 ENCOUNTER — Telehealth: Payer: Self-pay | Admitting: Allergy

## 2021-07-23 MED ORDER — PREDNISONE 10 MG PO TABS: 20.0000 mg | ORAL_TABLET | Freq: Every day | ORAL | 0 refills | Status: AC

## 2021-07-23 NOTE — Telephone Encounter (Signed)
Spoke to pt about the prednisone she stated understanding and was wondering about another medication was not sure what med it was but would call us back with the name so we can do pa

## 2021-07-23 NOTE — Telephone Encounter (Signed)
Called and spoke with patient and she stated that her cough was really bad last night and she was up all night. She did also state that she has been having yellow mucous. I advised about the Prednisone and sent it in to the requested pharmacy. Patient verbalized understanding.

## 2021-07-23 NOTE — Telephone Encounter (Signed)
Patient states her asthma got bad lastnight and she had to turn down the [facility] to feel a little better. This morning she woke up with swollen eyes and states it feels like she has rocks in them. She used eye drops but they are not working very well. She's wondering what she needs to do next.

## 2021-07-27 ENCOUNTER — Other Ambulatory Visit: Payer: Self-pay | Admitting: *Deleted

## 2021-07-27 MED ORDER — MONTELUKAST SODIUM 10 MG PO TABS: 10.0000 mg | ORAL_TABLET | Freq: Every day | ORAL | 1 refills | Status: DC

## 2021-08-02 ENCOUNTER — Ambulatory Visit (INDEPENDENT_AMBULATORY_CARE_PROVIDER_SITE_OTHER): Payer: 59 | Admitting: Psychiatry

## 2021-08-02 DIAGNOSIS — F419 Anxiety disorder, unspecified: Secondary | ICD-10-CM

## 2021-08-02 NOTE — Progress Notes (Signed)
[facility] Counselor/Therapist Progress Note  Patient ID: Chelsea Benson, MRN: [id number],    Date: 08/02/2021  Time Spent: 50 minutes   Virtual Visit via MyChart VIDEO Note:  VIDEO session Connected with patient by a video enabled telemedicine/telehealth application or telephone, with their informed consent, and verified patient privacy and that I am speaking with the correct person using two identifiers. I discussed the limitations, risks, security and privacy concerns of performing psychotherapy and management service by telephone and the availability of in person appointments. I also discussed with the patient that there may be a patient responsible charge related to this service. The patient expressed understanding and agreed to proceed. I discussed the treatment planning with the patient. The patient was provided an opportunity to ask questions and all were answered. The patient agreed with the plan and demonstrated an understanding of the instructions. The patient was advised to call  our office if  symptoms worsen or feel they are in a crisis state and need immediate contact.   Therapist Location: [facility] Patient Location: home   Treatment Type: Individual Therapy  Reported Symptoms: anxiety, stressed, some anger  Mental Status Exam:  Appearance:   Casual     Behavior:  Appropriate, Sharing, and Motivated  Motor:  Normal  Speech/Language:   Clear and Coherent  Affect:  anxiouos  Mood:  anxious  Thought process:  goal directed  Thought content:    Some obsessiveness  Sensory/Perceptual disturbances:    WNL  Orientation:  oriented to person, place, time/date, situation, day of week, month of year, year, and stated date of Aug. 29, 2022  Attention:  Fair  Concentration:  Good and Fair  Memory:  Feels that any memory concerns currently are related to stress "per my Dr."  Effie Shy of knowledge:   Good  Insight:    Good and Fair  Judgment:   Good  Impulse  Control:  Good and Fair   Risk Assessment: Danger to Self:  No Self-injurious Behavior: No Danger to Others: No Duty to Warn:no Physical Aggression / Violence:No  Access to Firearms a concern: No  Gang Involvement:No   Subjective: Patient today reporting increased stress and anxiety. Rates herself an "8" on 1-10 scale for stress/anxiety. Reports new health concerns as of 2 wks ago. Went to Dr and "my cataracts need to be removed and some issues regarding her diabetes "related to my vision". "Scared me but the doctor now says my issues can be fixed." Processes today her feelings of anger, frustration and anxiety re: brother's issues that affect her anxiety, and her own health concerns and acknowledges it's hard for her not to dwell on it.  Doctor is reportedly optimistic and patient trying to be more optimistic herself. Recognizing (sometimes) her tendency to assume to worst.  Trying to interrupt her anxious thoughts so they do not feed her anxiety and fear.  Worked with this some in session and patient responded well. States she is to have cataract surgery Nov. 30, 2022 so is looking forward to having that completed.   Interventions: Solution-Oriented/Positive Psychology and Ego-Supportive  Diagnosis:   ICD-10-CM   1. Anxiety disorder, unspecified type  F41.9       Treatment Goal Plan:  Patient not signing tx goals on computer screen due to COVID. Treatment Goals:   Goals remain on tx plan as patient works on strategies to meet her goals.  Progress will be documented each visit in "Progress" section on Plan.  Long term goal: (measurable) Reduce overall level, frequency, and intensity of the anxiety so that daily functioning is not impaired. * Patient will eventually report a rating of "4" or less" on 1-10 anxiety scale for a 68-month time period where her daily functioning is not impaired.  Short term goal: Increase understanding of beliefs and messages that produce the worry and  anxiety. Strategy: Patient will explore and work to stop cognitive messages that feed anxiety and work to replace them with more positive and empowering messages.     Plan:  Patient showing good motivation and participation in session today, working primarily on health-related anxiety and stress management to take better care of herself in this time of waiting for her cataract surgery.  She is checking which he TCC on her core status and being able to delay or make other arrangements for it, until after her surgery on November 30.  Anxiety has increased more recently due to health concerns and acknowledged this in session today and how she struggles to practice strategies that can help alleviate her anxiety.  Does stay on her medication as prescribed.  Denies any SI.  Encouraged patient to practice positive behaviors that have helped in the past including: Setting appropriate boundaries with others as needed, getting outside daily, walking some as she is able, staying in the present focusing on what she can control, keeping a good sleep routine, practice the interruption of her anxious/negative thoughts and challenge them and replace with thoughts that are more realistic and encouraging, healthy nutrition, spending time doing things with friends and family, staying on her medications as prescribed, consistent positive self talk, and feel good about the strength that she is showing as she works with goal-directed behaviors in the midst of challenging circumstances to move forward and be more emotionally healthy overall.  Goal review and progress/challenges noted with patient.  Next appointment within 3 weeks.   Mathis Fare, LCSW

## 2021-08-06 ENCOUNTER — Ambulatory Visit: Payer: Self-pay | Admitting: Family Medicine

## 2021-08-06 ENCOUNTER — Encounter: Payer: Self-pay | Admitting: Psychiatry

## 2021-08-06 ENCOUNTER — Telehealth (INDEPENDENT_AMBULATORY_CARE_PROVIDER_SITE_OTHER): Payer: 59 | Admitting: Psychiatry

## 2021-08-06 DIAGNOSIS — F419 Anxiety disorder, unspecified: Secondary | ICD-10-CM

## 2021-08-06 DIAGNOSIS — F410 Panic disorder [episodic paroxysmal anxiety] without agoraphobia: Secondary | ICD-10-CM

## 2021-08-06 DIAGNOSIS — F5101 Primary insomnia: Secondary | ICD-10-CM | POA: Diagnosis not present

## 2021-08-06 DIAGNOSIS — F3181 Bipolar II disorder: Secondary | ICD-10-CM | POA: Diagnosis not present

## 2021-08-06 MED ORDER — BUSPIRONE HCL 30 MG PO TABS: 30.0000 mg | ORAL_TABLET | Freq: Two times a day (BID) | ORAL | 2 refills | Status: DC

## 2021-08-06 MED ORDER — DAYVIGO 10 MG PO TABS: 10.0000 mg | ORAL_TABLET | Freq: Every day | ORAL | 0 refills | Status: DC

## 2021-08-06 MED ORDER — ZOLPIDEM TARTRATE 10 MG PO TABS: 5.0000 mg | ORAL_TABLET | Freq: Every evening | ORAL | 1 refills | Status: DC | PRN

## 2021-08-06 MED ORDER — OXCARBAZEPINE 300 MG PO TABS: 600.0000 mg | ORAL_TABLET | Freq: Every day | ORAL | 2 refills | Status: DC

## 2021-08-06 MED ORDER — MIRTAZAPINE 30 MG PO TABS: ORAL_TABLET | ORAL | 3 refills | Status: DC

## 2021-08-06 MED ORDER — CLONAZEPAM 0.5 MG PO TABS: ORAL_TABLET | ORAL | 2 refills | Status: DC
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
## 2021-08-10 ENCOUNTER — Telehealth: Payer: Self-pay | Admitting: Psychiatry

## 2021-08-10 ENCOUNTER — Other Ambulatory Visit: Payer: Self-pay

## 2021-08-10 DIAGNOSIS — F5101 Primary insomnia: Secondary | ICD-10-CM

## 2021-08-10 NOTE — Telephone Encounter (Signed)
Pt requesting Rx for Ambien to [facility]. Apt 12/2

## 2021-08-10 NOTE — Telephone Encounter (Signed)
Pended.

## 2021-08-10 NOTE — Telephone Encounter (Signed)
Sheralyn Boatman, it looks like Shanda Bumps already sent it on 08/06/21 to [facility]? Unless I saw it wrong

## 2021-08-10 NOTE — Telephone Encounter (Signed)
Yes you are right please refuse request

## 2021-08-16 ENCOUNTER — Ambulatory Visit (INDEPENDENT_AMBULATORY_CARE_PROVIDER_SITE_OTHER): Payer: 59 | Admitting: Psychiatry

## 2021-08-16 DIAGNOSIS — F419 Anxiety disorder, unspecified: Secondary | ICD-10-CM
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
## 2021-08-18 ENCOUNTER — Telehealth: Payer: Self-pay | Admitting: Allergy

## 2021-08-18 MED ORDER — BEPOTASTINE BESILATE 1.5 % OP SOLN: 1.0000 [drp] | Freq: Two times a day (BID) | OPHTHALMIC | 3 refills | Status: DC | PRN

## 2021-08-18 NOTE — Telephone Encounter (Signed)
Patient called and needs to have a rx  [facility] called into [facility] mail / [phone]

## 2021-08-18 NOTE — Telephone Encounter (Signed)
Put in refill for Bepreve to [facility]

## 2021-08-30 ENCOUNTER — Ambulatory Visit (INDEPENDENT_AMBULATORY_CARE_PROVIDER_SITE_OTHER): Payer: 59 | Admitting: Psychiatry

## 2021-08-30 DIAGNOSIS — F3181 Bipolar II disorder: Secondary | ICD-10-CM | POA: Diagnosis not present

## 2021-08-30 NOTE — Progress Notes (Signed)
[facility] Counselor/Therapist Progress Note  Patient ID: KILEE HEDDING, MRN: [id number],    Date: 08/30/2021  Time Spent: 48 minutes   Virtual Visit via MyChart Video Note:  VIDEO session Connected with patient by a video enabled telemedicine/telehealth application or telephone, with their informed consent, and verified patient privacy and that I am speaking with the correct person using two identifiers. I discussed the limitations, risks, security and privacy concerns of performing psychotherapy and management service by telephone and the availability of in person appointments. I also discussed with the patient that there may be a patient responsible charge related to this service. The patient expressed understanding and agreed to proceed. I discussed the treatment planning with the patient. The patient was provided an opportunity to ask questions and all were answered. The patient agreed with the plan and demonstrated an understanding of the instructions. The patient was advised to call  our office if  symptoms worsen or feel they are in a crisis state and need immediate contact.   Therapist Location: [facility] Patient Location: home   Treatment Type: Individual Therapy  Reported Symptoms: anxiety, some depression related to brother refusing surgery and began drinking again (continues to live with patient)  Mental Status Exam:  Appearance:   Casual     Behavior:  Appropriate, Sharing, and Motivated  Motor:  Uses cane in walking some  Speech/Language:   Clear and Coherent  Affect:  Anxious, some depression  Mood:  anxious and depressed  Thought process:  goal directed  Thought content:    Some overthinking, obsessiveness  Sensory/Perceptual disturbances:    WNL  Orientation:  oriented to person, place, time/date, situation, day of week, month of year, year, and stated date of Sept. 26, 2022  Attention:  Good  Concentration:  Fair  Memory:  WNL  Fund of  knowledge:   Good  Insight:    Good and Fair  Judgment:   Good  Impulse Control:  Good   Risk Assessment: Danger to Self:  No Self-injurious Behavior: No Danger to Others: No Duty to Warn:no Physical Aggression / Violence:No  Access to Firearms a concern: No  Gang Involvement:No   Subjective:  Patient today reporting anxiety and some depression related to family health and relationship issues, and her own health. Stressing over her cataract surgery coming up towards end of November.  Very frustrated that brother will not follow doctor's directions despite family encouragement and support. Rates herself a "9" again today on 1-10 anxiety scale. States that she talks to a neighbor daily now and that feels helpful and supportive. States doctor is watching her diabetes more closely.  Worked today in session with patient on her own anxious/fearful thoughts in challenging them and replacing with more positive and reality-based thoughts which do not feed her anxiety and stress. Focusing more on "what I can control or change" versus cannot. Working to decrease her own stress level and realizing she cannot make brother make certain choices for his own health. Encouraged her in contacts that help nuture herself including getting outside daily, staying in contact with her good friend daily, contact with friend in [place], watching her favorite TV programs, time with extended family, and trying not to assume worst case scenarios and rather have an outlook that is more neutral and allows for hoping of positive outcomes versus assuming something bad will happen.  Interventions: Solution-Oriented/Positive Psychology, Ego-Supportive, and Insight-Oriented  Diagnosis:   ICD-10-CM   1. Bipolar II disorder (HCC)  F31.81       Treatment Goal Plan:  Patient not signing tx goals on computer screen due to COVID. Treatment Goals:   Goals remain on tx plan as patient works on strategies to meet her goals.   Progress will be documented each visit in "Progress" section on Plan.   Long term goal: (measurable) Reduce overall level, frequency, and intensity of the anxiety so that daily functioning is not impaired. * Patient will eventually report a rating of "4" or less" on 1-10 anxiety scale for a 85-month time period where her daily functioning is not impaired.  Short term goal: Increase understanding of beliefs and messages that produce the worry and anxiety. Strategy: Patient will explore and work to stop cognitive messages that feed anxiety and work to replace them with more positive and empowering messages.     Plan:   Patient today showing motivation which did increase some during session.  Continues to be very frustrated with her brother who will not follow medical advice of his doctor and has resumed drinking alcohol.  Frustrated that she and other family members have tried to help support him, but now he is starting to make poor decisions and not follow medical advice.  Realizing that she cannot control her brother which she processed those feelings today in session.  Looked at some ways she can still offer support to him indirectly, which is something she has not tried.  Concerned about her own health also and is staying well connected with family and friends and her doctors.  They are watching her diabetic situation more closely per patient.  Seemed to be more calm towards the end of session and states that she is in touch with family and friends often.  Denies any SI.  Patient is encouraged to practice positive behaviors that have helped previously including: Setting appropriate boundaries with others as needed, staying in the present focusing on what she can control, practicing the interruptions of her anxious/negative thoughts and challenge them and replace with thoughts that are more realistic and encouraging, get outside some daily and walk as she is able, healthy nutrition, having a good sleep  routine, spending time doing things with friends and family, staying on her meds as prescribed, positive self talk, and recognize the strength she is showing as she works with goal-directed behaviors to move towards a direction of improved emotional health.  Goal review and progress/challenges noted with patient.  Next appointment within 2 to 3 weeks.  This record has been created using [employer].  Chart creation errors have been sought, but may not always have been located and corrected.  Such creation errors do not reflect on the standard of medical care provided.    Mathis Fare, LCSW

## 2021-08-31 ENCOUNTER — Other Ambulatory Visit: Payer: Self-pay | Admitting: Cardiology

## 2021-08-31 ENCOUNTER — Other Ambulatory Visit: Payer: Self-pay | Admitting: *Deleted

## 2021-08-31 ENCOUNTER — Ambulatory Visit: Payer: 59 | Admitting: Cardiology

## 2021-08-31 MED ORDER — POTASSIUM CHLORIDE CRYS ER 20 MEQ PO TBCR: 20.0000 meq | EXTENDED_RELEASE_TABLET | Freq: Every day | ORAL | 0 refills | Status: DC

## 2021-08-31 MED ORDER — APIXABAN 5 MG PO TABS: 5.0000 mg | ORAL_TABLET | Freq: Two times a day (BID) | ORAL | 0 refills | Status: DC

## 2021-08-31 MED ORDER — DILTIAZEM HCL ER COATED BEADS 300 MG PO CP24: 300.0000 mg | ORAL_CAPSULE | Freq: Every day | ORAL | 0 refills | Status: DC

## 2021-08-31 MED ORDER — PRAVASTATIN SODIUM 20 MG PO TABS: 20.0000 mg | ORAL_TABLET | Freq: Every day | ORAL | 0 refills | Status: DC

## 2021-08-31 MED ORDER — DILTIAZEM HCL 60 MG PO TABS: ORAL_TABLET | ORAL | 0 refills | Status: DC

## 2021-08-31 MED ORDER — HYDROCHLOROTHIAZIDE 25 MG PO TABS: 25.0000 mg | ORAL_TABLET | Freq: Every day | ORAL | 0 refills | Status: DC

## 2021-08-31 NOTE — Telephone Encounter (Signed)
Patient requested -- refill of medication 90 day supply  e-sent   Patient has follow up in Oct 26 ,2022

## 2021-09-08 ENCOUNTER — Other Ambulatory Visit: Payer: Self-pay | Admitting: Allergy

## 2021-09-16 ENCOUNTER — Other Ambulatory Visit: Payer: Self-pay | Admitting: Allergy

## 2021-09-16 DIAGNOSIS — J4541 Moderate persistent asthma with (acute) exacerbation: Secondary | ICD-10-CM

## 2021-09-23 ENCOUNTER — Other Ambulatory Visit: Payer: Self-pay | Admitting: Allergy

## 2021-09-28 ENCOUNTER — Ambulatory Visit (INDEPENDENT_AMBULATORY_CARE_PROVIDER_SITE_OTHER): Payer: 59 | Admitting: Psychiatry

## 2021-09-28 DIAGNOSIS — F3181 Bipolar II disorder: Secondary | ICD-10-CM

## 2021-09-28 NOTE — Progress Notes (Signed)
[facility] Counselor/Therapist Progress Note  Patient ID: Chelsea Benson, MRN: [id number],    Date: 09/28/2021  Time Spent: 50 minutes  Virtual Visit via Telephone Note Connected with patient by a telemedicine/telehealth application, with their informed consent, and verified patient privacy and that I am speaking with the correct person using two identifiers. I discussed the limitations, risks, security and privacy concerns of performing psychotherapy and the availability of in person appointments. I also discussed with the patient that there may be a patient responsible charge related to this service. The patient expressed understanding and agreed to proceed. I discussed the treatment planning with the patient. The patient was provided an opportunity to ask questions and all were answered. The patient agreed with the plan and demonstrated an understanding of the instructions. The patient was advised to call  our office if  symptoms worsen or feel they are in a crisis state and need immediate contact.   Therapist Location: [facility] Patient Location: home    Treatment Type: Individual Therapy  Reported Symptoms: anxiety, depression  Mental Status Exam:  Appearance:   Casual     Behavior:  Appropriate, Sharing, and Motivated  Motor:  Normal (with the help of a rollater)  Speech/Language:   Clear and Coherent  Affect:  Anxious, some depression  Mood:  anxious and depressed  Thought process:  goal directed  Thought content:    overthinking  Sensory/Perceptual disturbances:    WNL  Orientation:  oriented to person, place, time/date, situation, day of week, month of year, year, and stated date of Oct. 25, 2022  Attention:  Good  Concentration:  Good  Memory:  Some occasional short term memory issues, Dr is aware   Fund of knowledge:   Good  Insight:    Good and Fair  Judgment:   Good  Impulse Control:  Good   Risk Assessment: Danger to Self:   No Self-injurious Behavior: No Danger to Others: No Duty to Warn:no Physical Aggression / Violence:No  Access to Firearms a concern: No  Gang Involvement:No   Subjective: Patient today reporting some depression and continued anxiety mostly related to personal health issues, concerns with brother that lives with her, and other family situations. Brother making poor decisions, some of which impact patient. Patient working today on ways to set better limits with brother without feeling guilty.  Also seeing how she can easily get pulled into stressful situations with other family members and working today on better boundaries so that "their problems don't become my problems". Expressed concerns about her own health issues, various appts she has had and appts she has coming up soon. Anxiety level tends to fluctuate up and down and patient is wanting to set "even better limits and not answer the phone every time certain family members call, and create more space between them and her". Cataract surgery still on for November. Rates self a "7" today on anxiety 1-10 scale, a "7" on depression scale 1-10. Feels it helps her depression and anxiety level to talk through her situations and get support. "I have to remember I can't control others, just myself."  Patient encouraged to continue setting limits with others, and to follow up on the advice of the doctors with which she is involved in current medical issues.  Interventions: Solution-Oriented/Positive Psychology and Insight-Oriented  Diagnosis:   ICD-10-CM   1. Bipolar II disorder (HCC)  F31.81       Treatment Goal Plan:  Patient not signing tx  goals on computer screen due to COVID. Treatment Goals:   Goals remain on tx plan as patient works on strategies to meet her goals.  Progress will be documented each visit in "Progress" section on Plan.   Long term goal: (measurable) Reduce overall level, frequency, and intensity of the anxiety so that daily  functioning is not impaired.  Patient will eventually report a rating of "4" or less" on 1-10 anxiety scale for a 47-month time period where her daily functioning is not impaired.  Short term goal: Increase understanding of beliefs and messages that produce the worry and anxiety. Strategy: Patient will explore and work to stop cognitive messages that feed anxiety and work to replace them with more positive and empowering messages.     Plan: Patient today showing good motivation and she worked on personal issues of stress, anxiety, and depression as noted above.  Also worked on establishing better boundaries primarily within her family so that she does not get pulled into their stressful situations or poor choices, and can take care of herself and not add to her own stress and anxieties.  Patient encouraged in practicing positive behaviors that have helped her previously including: Getting outside some daily and walking as she is able, practicing interrupting her anxious/negative thoughts and challenge them and replace with thoughts that are more realistic and encouraging, setting appropriate boundaries with others as needed especially within the family, trying not to assume worst case scenarios and rather have an outlook that is more neutral and allows for hoping of positive outcomes versus assuming something bad will always happen, staying in the present focusing on what she can control, healthy nutrition, keeping a good sleep routine, staying on her medications as prescribed, positive self talk, spending time doing things with friends and family, and realize the strength she shows working with goal-directed behaviors to move in a direction that supports improved emotional health and overall stability.  Goal review and progress/challenges noted with patient.  Next appointment within 3 weeks.  This record has been created using [employer].  Chart creation errors have been sought, but may not always  have been located and corrected.  Such creation errors do not reflect on the standard of medical care provided.    Mathis Fare, LCSW

## 2021-09-29 ENCOUNTER — Other Ambulatory Visit: Payer: Self-pay

## 2021-09-29 ENCOUNTER — Ambulatory Visit (INDEPENDENT_AMBULATORY_CARE_PROVIDER_SITE_OTHER): Payer: 59 | Admitting: Cardiology

## 2021-09-29 ENCOUNTER — Encounter: Payer: Self-pay | Admitting: Cardiology

## 2021-09-29 VITALS — BP 150/100 | HR 82 | Ht 64.75 in | Wt 174.0 lb

## 2021-09-29 DIAGNOSIS — I480 Paroxysmal atrial fibrillation: Secondary | ICD-10-CM

## 2021-09-29 DIAGNOSIS — E1169 Type 2 diabetes mellitus with other specified complication: Secondary | ICD-10-CM

## 2021-09-29 DIAGNOSIS — R002 Palpitations: Secondary | ICD-10-CM | POA: Diagnosis not present

## 2021-09-29 DIAGNOSIS — E785 Hyperlipidemia, unspecified: Secondary | ICD-10-CM

## 2021-09-29 DIAGNOSIS — I10 Essential (primary) hypertension: Secondary | ICD-10-CM

## 2021-09-29 DIAGNOSIS — Z7189 Other specified counseling: Secondary | ICD-10-CM

## 2021-09-29 DIAGNOSIS — R071 Chest pain on breathing: Secondary | ICD-10-CM

## 2021-09-29 MED ORDER — POTASSIUM CHLORIDE CRYS ER 20 MEQ PO TBCR: 20.0000 meq | EXTENDED_RELEASE_TABLET | Freq: Every day | ORAL | 3 refills | Status: DC

## 2021-09-29 MED ORDER — HYDROCHLOROTHIAZIDE 25 MG PO TABS: 25.0000 mg | ORAL_TABLET | Freq: Every day | ORAL | 3 refills | Status: DC

## 2021-09-29 MED ORDER — PRAVASTATIN SODIUM 20 MG PO TABS: 20.0000 mg | ORAL_TABLET | Freq: Every day | ORAL | 3 refills | Status: AC

## 2021-09-29 MED ORDER — DILTIAZEM HCL ER COATED BEADS 300 MG PO CP24: 300.0000 mg | ORAL_CAPSULE | Freq: Every day | ORAL | 0 refills | Status: DC

## 2021-09-29 MED ORDER — APIXABAN 5 MG PO TABS: 5.0000 mg | ORAL_TABLET | Freq: Two times a day (BID) | ORAL | 3 refills | Status: DC

## 2021-09-29 NOTE — Patient Instructions (Signed)
Medication Instructions:   No chnages *If you need a refill on your cardiac medications before your next appointment, please call your pharmacy*   Lab Work:  Not needed   Testing/Procedures:  Not needed  Follow-Up: At [facility], you and your health needs are our priority.  As part of our continuing mission to provide you with exceptional heart care, we have created designated Provider Care Teams.  These Care Teams include your primary Cardiologist (physician) and Advanced Practice Providers (APPs -  Physician Assistants and Nurse Practitioners) who all work together to provide you with the care you need, when you need it.     Your next appointment:   12 month(s)  The format for your next appointment:   In Person  Provider:   Bryan Lemma, MD   Other Instructions

## 2021-09-29 NOTE — Progress Notes (Signed)
Primary Care Provider: Marda Stalker, PA-C Cardiologist: Glenetta Hew, MD Electrophysiologist: None  Clinic Note: Chief Complaint  Patient presents with   Follow-up    No major complaints.   Atrial Fibrillation    Has not been aware of any prolonged spells   Hypertension    Blood pressure at home ranges from 120-130/70-80 mmHg.    ===================================  ASSESSMENT/PLAN   Problem List Items Addressed This Visit       Cardiology Problems   Paroxysmal atrial fibrillation ([place]); CHA2DS2-VASc score 4 HTN, DM, female, age)-on Eliquis (Chronic)    She seems to be maintaining sinus rhythm.  Very fleeting episodes of A. fib.  Rates is well controlled on current dose of diltiazem.  Not having to use additional short acting dose.  She still has it PRN.  Plan: Continue current dose of diltiazem XT 200 mg daily with PRN 60 mg tablets as needed for breakthroughs. Continue Eliquis.-No bleeding issues.      Relevant Medications   apixaban (ELIQUIS) 5 MG TABS tablet   diltiazem (CARDIZEM CD) 300 MG 24 hr capsule   hydrochlorothiazide (HYDRODIURIL) 25 MG tablet   pravastatin (PRAVACHOL) 20 MG tablet   Other Relevant Orders   EKG 12-Lead (Completed)   Hyperlipidemia associated with type 2 diabetes mellitus (HCC) (Chronic)    Stable lipid panel with LDL of 79.  Pretty much at target goal.  Remains on pravastatin.  If numbers go up, would probably convert to more potent statin, but for now continue current meds.  Mild improvement in A1c from September 2020-down to 7 from 7.3. She is also lost 10 pounds on Ozempic in addition to metformin.  Labs to be followed by PCP.  A lot of her neurologic symptoms are notably improved with the addition of pregabalin.      Relevant Medications   apixaban (ELIQUIS) 5 MG TABS tablet   diltiazem (CARDIZEM CD) 300 MG 24 hr capsule   hydrochlorothiazide (HYDRODIURIL) 25 MG tablet   pravastatin (PRAVACHOL) 20 MG tablet   Essential  hypertension (Chronic)    Blood pressure is pretty high today.  Unusual.  At home it is usually much better controlled. I have asked that she monitor her home blood pressures continuously as she is doing.  If they do trend up, would probably need to add ARB      Relevant Medications   apixaban (ELIQUIS) 5 MG TABS tablet   diltiazem (CARDIZEM CD) 300 MG 24 hr capsule   hydrochlorothiazide (HYDRODIURIL) 25 MG tablet   pravastatin (PRAVACHOL) 20 MG tablet     Other   Pain of anterior chest wall with respiration (Chronic)    Normal coronary CTA, still has intermittent reproducible chest pain on exam.  Costochondritis is probably the main etiology.  We talked about using Tylenol or intermittent NSAIDs.  Leery of using too much in the way of NSAIDs because of the regulation.  Prefer Tylenol.      Cardiac risk counseling    Thankfully, her coronary CT angiogram back in 2020 showed coronary calcium score of 0  Although the calcium score is 0, she does have increased risk with borderline obesity, hypertension and diabetes (essentially metabolic syndrome).  LDL goal should be less than 100, closer to 70.  Close to control blood pressure and glycemic control as well as lipids is important.      Rapid palpitations - Primary (Chronic)   Relevant Orders   EKG 12-Lead (Completed)   ===================================  HPI:  Chelsea Benson is a 70 y.o. female with a PMH notable for HTN, HLD and recently diagnosed PAF, along with Bipolar Disorder who presents today for delayed 36-monthfollow-up  Diagnosed with A. fib back in July 2021.  She had a coronary CTA that showed no evidence of significant disease.  She monitors her rhythm using na EMAY monitor  FRENELLE STEGENGAwas last seen on February 24, 2021 to discuss results of event monitor.  She had 4 minutes of atrial fibrillation during the entire month monitoring.  Rates were well controlled on her milligram diltiazem.  Recent  Hospitalizations:  ER visit 05/21/2021 with chronic low back pain  Reviewed  CV studies:    The following studies were reviewed today: (if available, images/films reviewed: From Epic Chart or Care Everywhere) None:  Interval History:   FELINORE SHULTSreturns today for 657-monthollow-up overall doing pretty well.  She has had off-and-on chest discomfort but has not exertional in nature.  Nothing associate with dyspnea.  He does not recall feeling any irregular heartbeats or palpitations since last visit, has not had to use any additional dose of diltiazem-this seems to be the case increased diltiazem dose to 300 mg..  No bleeding issues on Eliquis.  Occasional skipped beats here there, nothing prolonged.  No lightheadedness or dizziness, fatigue.   No exertional dyspnea, chest deconditioning related dyspnea and fatigue. No chest heaviness or tightness with rest or exertion.  Only noted that symptom when she was in A. fib..  CV Review of Symptoms (Summary) Cardiovascular ROS: no chest pain or dyspnea on exertion positive for - irregular heartbeat, palpitations, and relatively well-controlled, nothing prolonged. negative for - edema, orthopnea, paroxysmal nocturnal dyspnea, rapid heart rate, shortness of breath, or lightheadedness, dizziness or wooziness, syncope/near syncope or TIA/PS, claudication  REVIEWED OF SYSTEMS   Review of Systems  Constitutional:  Negative for malaise/fatigue (Energy level is fine) and weight loss.  HENT:  Negative for nosebleeds.   Respiratory:  Negative for cough and shortness of breath.   Cardiovascular:  Negative for leg swelling.  Gastrointestinal:  Negative for abdominal pain, blood in stool and melena.  Genitourinary:  Negative for dysuria, flank pain and hematuria.  Musculoskeletal:  Positive for back pain and joint pain. Negative for falls.  Neurological:  Negative for dizziness and focal weakness.  Psychiatric/Behavioral: Negative.         Psych  symptoms seem to be stable..   I have reviewed and (if needed) personally updated the patient's problem list, medications, allergies, past medical and surgical history, social and family history.   PAST MEDICAL HISTORY   Past Medical History:  Diagnosis Date   Allergic rhinoconjunctivitis    Anxiety    Arthritis    Spondylolysis, knee and ankle arthritis pains.   Asthma    Bipolar disorder ([place]   Chronic back pain    Has had several surgeries.  He uses a walker   Depression    Diabetes mellitus without complication ([place]   [place] in 2007   Diabetes [place], type II ([place]   Eczema    Headache(784.0)    sinus related    History of nephrolithiasis    Hx of pulmonary embolus 2017   Hyperlipidemia    Hypertension    Insomnia    Memory change    OSA on CPAP    PSG 05/24/2016: AHI 17/hour 02 mean 76%.  HSAT 06/13/17, AHI 11-hour 02 mean 79%   Paroxysmal atrial fibrillation (  [place]) 02/24/2021   CHA2DS2-VASc score 4 (HTN, DM, female, age-53); on Eliquis   Tremor of both hands     PAST SURGICAL HISTORY   Past Surgical History:  Procedure Laterality Date   ABDOMINAL HYSTERECTOMY     BACK SURGERY     2012- lumbar fusion   BREAST SURGERY     L cyst removed - 1972   BUNIONECTOMY Left    Great toe   calf -R- cyst removed     CARPAL TUNNEL RELEASE     both hands    CORONARY CT ANGIOGRAM  07/17/2019    Coronary calcium score 0.  Right dominant system.  Difficult to assess because of cardiac motion, but there is no obvious evidence of CAD.  Mild PA dilation.    HAMMER TOE SURGERY Left    L foot   NASAL SINUS SURGERY     NM MYOVIEW LTD  12/2008   Normal study.  Normal LV function.  EF 61%.  Apical thinning but no ischemia or infarction.   SHOULDER ARTHROSCOPY Left    R shoulder- RCR   TOE SURGERY     TOTAL KNEE ARTHROPLASTY Right 05/22/2017   Procedure: TOTAL KNEE ARTHROPLASTY;  Surgeon: Frederik Pear, MD;  Location: [facility];  Service: Orthopedics;  Laterality: Right;    TRANSTHORACIC ECHOCARDIOGRAM  06/2009    Technically difficult.  Moderate concentric LVH with normal LV function.  No regional wall motion normalities.  EF> 55%.  Normal right ventricle.  No valve lesions.  Normal filling pressures.   TRIGGER FINGER RELEASE     L thumb   TUBAL LIGATION      Immunization History  Administered Date(s) Administered   Influenza Split 09/04/2010   Influenza, High Dose Seasonal PF 11/11/2011   Influenza,trivalent, recombinat, inj, PF 12/07/2012, 10/04/2013   PFIZER(Purple Top)SARS-COV-2 Vaccination 01/28/2020, 02/19/2020    MEDICATIONS/ALLERGIES   Current Meds  Medication Sig   ACCU-CHEK GUIDE test strip TEST BID PRN   albuterol (PROVENTIL) (2.5 MG/3ML) 0.083% nebulizer solution USE 1 VIAL IN NEBULIZER EVERY 4 HOURS - and as needed   albuterol (VENTOLIN HFA) 108 (90 Base) MCG/ACT inhaler USE 2 INHALATIONS BY MOUTH  EVERY 4 HOURS AS NEEDED FOR WHEEZING OR SHORTNESS OF  BREATH   apixaban (ELIQUIS) 5 MG TABS tablet Take 1 tablet (5 mg total) by mouth 2 (two) times daily.   ARIPiprazole (ABILIFY) 2 MG tablet TAKE 2 TABLETS BY MOUTH  DAILY   Bepotastine Besilate (BEPREVE) 1.5 % SOLN Place 1 drop into both eyes 2 (two) times daily as needed (for itchy water eyes).   Blood Glucose Monitoring Suppl (ACCU-CHEK GUIDE) w/Device KIT See admin instructions.   budesonide-formoterol (SYMBICORT) 160-4.5 MCG/ACT inhaler INHALE 2 INHALATIONS BY  MOUTH INTO THE LUNGS IN THE MORNING AND AT BEDTIME   busPIRone (BUSPAR) 30 MG tablet Take 1 tablet (30 mg total) by mouth 2 (two) times daily.   Carbinoxamine Maleate 4 MG TABS Take 1 tablet (4 mg total) by mouth in the morning and at bedtime.   Carbinoxamine Maleate 4 MG TABS Take 2 tablets (8 mg total) by mouth in the morning and at bedtime.   Carbinoxamine Maleate 4 MG TABS Take 2 tablets (8 mg total) by mouth 2 (two) times daily as needed.   CLOBETASOL PROPIONATE E 0.05 % emollient cream Apply 1 application topically 2 (two) times  daily as needed (rash).    clonazePAM (KLONOPIN) 0.5 MG tablet Take 1/2-1 tab po TID prn anxiety. (Do not combine  with Ambien)   desonide (DESOWEN) 0.05 % ointment APPLY TO AFFECTED AREA(S)  TOPICALLY TWICE DAILY AS  NEEDED   diltiazem (CARDIZEM CD) 300 MG 24 hr capsule Take 1 capsule (300 mg total) by mouth daily.   diltiazem (CARDIZEM) 60 MG tablet TAKE 1 TABLET BY MOUTH AS NEEDED FOR PALPITIATIONS. TAKE EVERY 6 HOURS UP TO TWICE DAILY WITH EACH EPISODE(THIS IS ADDITION TO 300MG DILITAZEM)   EPINEPHrine 0.3 mg/0.3 mL IJ SOAJ injection INJECT CONTENTS OF 1 PEN AS NEEDED FOR ALLERGIC  RESPONSE AS DIRECTED BY MD. SEEK MEDICAL ATTENTION  AFTER USE.   famotidine (PEPCID) 20 MG tablet TAKE 1 TABLET BY MOUTH  DAILY AFTER SUPPER.   fluticasone (FLONASE) 50 MCG/ACT nasal spray Place 2 sprays into both nostrils daily.   hydrochlorothiazide (HYDRODIURIL) 25 MG tablet Take 1 tablet (25 mg total) by mouth daily.   hydrOXYzine (ATARAX/VISTARIL) 25 MG tablet Take 1 tablet (25 mg total) by mouth every 6 (six) hours as needed.   ipratropium (ATROVENT) 0.06 % nasal spray Place 2 sprays into both nostrils 3 (three) times daily. Use for nasal drainage   Lemborexant (DAYVIGO) 10 MG TABS Take 10 mg by mouth at bedtime.   meloxicam (MOBIC) 15 MG tablet Take 1 tablet (15 mg total) by mouth daily.   metFORMIN (GLUCOPHAGE-XR) 500 MG 24 hr tablet Take 500 mg by mouth 1 day or 1 dose.    mirtazapine (REMERON) 30 MG tablet TAKE 1 TABLET BY MOUTH AT  BEDTIME   montelukast (SINGULAIR) 10 MG tablet Take 1 tablet (10 mg total) by mouth at bedtime.   NUCALA 100 MG/ML SOAJ INJECT 100MG SUBCUTANEOUSLY EVERY 4 WEEKS (GIVEN AT MD  OFFICE)   Oxcarbazepine (TRILEPTAL) 300 MG tablet Take 2 tablets (600 mg total) by mouth at bedtime.   pantoprazole (PROTONIX) 40 MG tablet TAKE 1 TABLET BY MOUTH  DAILY. TAKE 30 TO 60  MINUTES BEFORE FIRST MEAL  OF THE DAY   potassium chloride SA (KLOR-CON) 20 MEQ tablet Take 1 tablet (20 mEq total) by  mouth daily.   pravastatin (PRAVACHOL) 20 MG tablet Take 1 tablet (20 mg total) by mouth at bedtime.   pregabalin (LYRICA) 100 MG capsule Take 100 mg by mouth 3 (three) times daily.   Respiratory Therapy Supplies (NEBULIZER) DEVI Use as directed with nebulizer solution.   Respiratory Therapy Supplies (NEBULIZER/TUBING/MOUTHPIECE) KIT Use as directed with nebulizer machine   Semaglutide, 1 MG/DOSE, (OZEMPIC, 1 MG/DOSE,) 2 MG/1.5ML SOPN Take 1 mg by mouth once a week.   tizanidine (ZANAFLEX) 2 MG capsule Take 1 capsule (2 mg total) by mouth 3 (three) times daily.   triamcinolone cream (KENALOG) 0.1 % 1 application topically below the face avoiding the groin, and underarms   trimethoprim-polymyxin b (POLYTRIM) ophthalmic solution INSTILL 1 DROP INTO BOTH  EYES EVERY 3 HOURS FOR 7  DAYS AND THEN STOP AS  DIRECTED   zolpidem (AMBIEN) 10 MG tablet Take 0.5-1 tablets (5-10 mg total) by mouth at bedtime as needed. for sleep    Allergies  Allergen Reactions   Ramipril Other (See Comments)   Cymbalta [Duloxetine Hcl] Other (See Comments)    Caused hair loss   Methylprednisolone Acetate Hives and Rash    05/29/20 developed hives "all over my back" after MBB; resolved with Benadryl and "a few days."  Can tolerate Dexamethasone/Decadron. 05/29/20 developed hives "all over my back" after MBB; resolved with Benadryl and "a few days."  Can tolerate Dexamethasone/Decadron.   Rosuvastatin Calcium Other (See Comments)  and Cough    Rapid heart rate   Almond Meal Itching and Swelling    Tongue swells and itches   Almond Oil Itching and Swelling    Itching and swelling of tongue    Carvedilol Cough   Crestor [Rosuvastatin] Cough   Fish Allergy Itching    Halibut fish   Losartan Potassium Cough   Morphine And Related Itching    SOCIAL HISTORY/FAMILY HISTORY   Reviewed in Epic:  Pertinent findings:  Social History   Tobacco Use   Smoking status: Never    Passive exposure: Yes   Smokeless tobacco:  Never   Tobacco comments:    grandson smokes, but in his car  Vaping Use   Vaping Use: Never used  Substance Use Topics   Alcohol use: Yes    Comment: rare use   Drug use: No   Social History   Social History Narrative   She is currently single.  Has 2 girls.  Has some college education.  Currently disabled due to chronic back pain and not employed.   07/01/19 lives with brother Shanon Brow   Caffeine, none    OBJCTIVE -PE, EKG, labs   Wt Readings from Last 3 Encounters:  09/29/21 174 lb (78.9 kg)  02/24/21 177 lb 12.8 oz (80.6 kg)  01/15/21 176 lb 9.6 oz (80.1 kg)    Physical Exam: BP (!) 150/100 (BP Location: Left Arm)   Pulse 82   Ht 5' 4.75" (1.645 m)   Wt 174 lb (78.9 kg)   SpO2 95%   BMI 29.18 kg/m  Home blood pressure readings range from 120-130/70-80 mmHg. Physical Exam Vitals reviewed.  Constitutional:      Appearance: Normal appearance. She is obese. She is not ill-appearing or toxic-appearing.     Comments: Well-nourished, well-groomed.  Borderline obese.  HENT:     Head: Normocephalic and atraumatic.  Neck:     Vascular: No carotid bruit, hepatojugular reflux or JVD.  Cardiovascular:     Rate and Rhythm: Normal rate and regular rhythm. No extrasystoles are present.    Chest Wall: PMI is not displaced.     Pulses: Normal pulses.     Heart sounds: S1 normal and S2 normal. Heart sounds are distant. Murmur (soft 1/6 C-D SEM at RUSB.) heard.    No friction rub. No gallop.  Pulmonary:     Effort: Pulmonary effort is normal. No respiratory distress.     Breath sounds: Normal breath sounds.  Chest:     Chest wall: Tenderness (Mild bilateral costochondral tenderness.) present.  Musculoskeletal:        General: Swelling (Trivial ankle) present.     Cervical back: Normal range of motion and neck supple.  Skin:    General: Skin is warm and dry.  Neurological:     General: No focal deficit present.     Mental Status: She is alert and oriented to person, place,  and time.     Gait: Gait abnormal (Walks with a cane.).  Psychiatric:        Mood and Affect: Mood normal.        Behavior: Behavior normal.        Thought Content: Thought content normal.        Judgment: Judgment normal.     Comments: Somewhat blunted/flat affect.    Adult ECG Report  Rate: 82;  Rhythm: normal sinus rhythm and left axis deviation, nonspecific ST and T wave changes. ; Otherwise normal voltage, intervals, and durations.  Narrative Interpretation: Stable  Recent Labs: Reviewed from [facility] 07/30/2021: TC 155, TG 158, HDL 48, LDL 79.  A1c 7.0.  Hgb 12.5.  BUN 20, Cr 0.66, K+ 4.5.  TSH 0.85.  ALT 25, AST 16.  Alk phos 57.  Lab Results  Component Value Date   CREATININE 0.62 07/06/2020   BUN 13 07/06/2020   NA 139 07/06/2020   K 4.2 07/06/2020   CL 100 07/06/2020   CO2 26 07/06/2020   Lab Results  Component Value Date   HGBA1C 7.3 (H) 08/14/2019   No results found for: TSH  ==================================================  COVID-19 Education: The signs and symptoms of COVID-19 were discussed with the patient and how to seek care for testing (follow up with PCP or arrange E-visit).    I spent a total of 32 minutes with the patient spent in direct patient consultation.  Additional time spent with chart review  / charting (studies, outside notes, etc): 16 min Total Time: 48 min  Current medicines are reviewed at length with the patient today.  (+/- concerns) none  This visit occurred during the SARS-CoV-2 public health emergency.  Safety protocols were in place, including screening questions prior to the visit, additional usage of staff PPE, and extensive cleaning of exam room while observing appropriate contact time as indicated for disinfecting solutions.  Notice: This dictation was prepared with Dragon dictation along with smart phrase technology. Any transcriptional errors that result from this process are unintentional and may not be corrected upon  review.  Patient Instructions / Medication Changes & Studies & Tests Ordered   Patient Instructions  Medication Instructions:   No chnages *If you need a refill on your cardiac medications before your next appointment, please call your pharmacy*   Lab Work:  Not needed   Testing/Procedures:  Not needed  Follow-Up: At [facility], you and your health needs are our priority.  As part of our continuing mission to provide you with exceptional heart care, we have created designated Provider Care Teams.  These Care Teams include your primary Cardiologist (physician) and Advanced Practice Providers (APPs -  Physician Assistants and Nurse Practitioners) who all work together to provide you with the care you need, when you need it.     Your next appointment:   12 month(s)  The format for your next appointment:   In Person  Provider:   Glenetta Hew, MD   Other Instructions    Studies Ordered:   Orders Placed This Encounter  Procedures   EKG 12-Lead     Glenetta Hew, M.D., M.S. Interventional Cardiologist   Pager # [phone] Phone # [phone] [address] [place], [place] [address]   Thank you for choosing Heartcare at [facility]!!

## 2021-10-03 ENCOUNTER — Other Ambulatory Visit: Payer: Self-pay | Admitting: Allergy

## 2021-10-12 ENCOUNTER — Ambulatory Visit (INDEPENDENT_AMBULATORY_CARE_PROVIDER_SITE_OTHER): Payer: 59 | Admitting: Psychiatry

## 2021-10-12 DIAGNOSIS — F419 Anxiety disorder, unspecified: Secondary | ICD-10-CM | POA: Diagnosis not present

## 2021-10-12 NOTE — Progress Notes (Signed)
[facility] Counselor/Therapist Progress Note  Patient ID: Chelsea Benson, MRN: [id number],    Date: 10/12/2021  Time Spent: 48 minutes   Virtual Visit via VIDEO:  VIDEO session with patient Connected with patient by a telemedicine/telehealth application, with their informed consent, and verified patient privacy and that I am speaking with the correct person using two identifiers. I discussed the limitations, risks, security and privacy concerns of performing psychotherapy and the availability of in person appointments. I also discussed with the patient that there may be a patient responsible charge related to this service. The patient expressed understanding and agreed to proceed. I discussed the treatment planning with the patient. The patient was provided an opportunity to ask questions and all were answered. The patient agreed with the plan and demonstrated an understanding of the instructions. The patient was advised to call  our office if  symptoms worsen or feel they are in a crisis state and need immediate contact.   Therapist Location: [facility] Patient Location: home   Treatment Type: Individual Therapy  Reported Symptoms: anxiety, "worrying about her health"  Mental Status Exam:  Appearance:   Casual     Behavior:  Appropriate, Sharing, and Motivated  Motor:  Normal  Speech/Language:   Clear and Coherent  Affect:  anxious  Mood:  anxious  Thought process:  goal directed  Thought content:    Some rumination and overthinking  Sensory/Perceptual disturbances:    WNL  Orientation:  oriented to person, place, time/date, situation, day of week, month of year, year, and stated date of Nov.8, 2022.  Attention:  Good  Concentration:  Fair  Memory:  WNL  Fund of knowledge:   Good  Insight:    Good and Fair  Judgment:   Good  Impulse Control:  Good   Risk Assessment: Danger to Self:  No Self-injurious Behavior: No Danger to Others: No Duty to  Warn:no Physical Aggression / Violence:No  Access to Firearms a concern: No  Gang Involvement:No   Subjective: Patient today reporting anxiety as she "worries a lot about my health".  Did fall recently in bedroom and reports she saw Dr and was told she "maybe has a pinched nerve or disc problem but is to return to Dr next week.  States she is using prednisone patches with benefit in the meantime. Adult kids have checked in on her and brother still living with patient.  Patient working today on her long-term habit of worrying excessively and assuming worst case scenarios.  She does for it in sessions as this is a difficult behavior for her to change but she has had some success during the time that she has been involved in treatment here at our office.  She is able to show progress at times and the difficulty is in maintaining it, and it is it is hard to change "after being a worrier for so long".  She does show motivation and her interaction with other people helps her feel supported and in her trying to be more confident.  Good effort in session today and was appearing via our video session, to be less anxious and hoping for some positive news from her doctor at next visit.  Interventions: Solution-Oriented/Positive Psychology and Ego-Supportive  Diagnosis:   ICD-10-CM   1. Anxiety disorder, unspecified type  F41.9       Plan:  Patient today showing good motivation and participation in session working on "my worrying and my anxiety." Hard to change her  habit of worrying but did seem more involved today as we discussed how worrying does not help, and it actually impedes her ability to see things more realistically and have more confidence in her ability to cope in more helpful ways with various stressors.  Encouraged patient and her practice of positive behaviors that have been helpful to her in the past including: Staying in contact with a close neighbor friend, remaining in contact with her family  especially the ones that live locally, getting outside some each day, walking as she is able, practice interrupting her anxious/negative thoughts and challenge them and replace them with thoughts that are more realistic and encouraging, setting appropriate boundaries with others as needed especially within the family, trying not to assume worst case scenarios and rather have an outlook that is more neutral and allows for hoping of positive outcomes versus assuming something bad will always happen, staying in the present focusing on what she can control, healthy nutrition, keeping a good sleep routine, staying on her medications as prescribed, positive self talk, spending time doing things with friends and family, and realize her strength that she shows working with goal-directed behaviors to move in a direction that supports overall stability and improved emotional health.  Goal review and progress/challenges noted with patient.  Next appointment within 2 to 3 weeks.  This record has been created using [employer].  Chart creation errors have been sought, but may not always have been located and corrected.  Such creation errors do not reflect on the standard of medical care provided.    Mathis Fare, LCSW

## 2021-10-14 ENCOUNTER — Other Ambulatory Visit: Payer: Self-pay | Admitting: Family Medicine

## 2021-10-14 ENCOUNTER — Other Ambulatory Visit: Payer: Self-pay | Admitting: Psychiatry

## 2021-10-14 ENCOUNTER — Other Ambulatory Visit: Payer: Self-pay | Admitting: Allergy

## 2021-10-14 DIAGNOSIS — J4541 Moderate persistent asthma with (acute) exacerbation: Secondary | ICD-10-CM

## 2021-10-14 DIAGNOSIS — F419 Anxiety disorder, unspecified: Secondary | ICD-10-CM

## 2021-10-14 DIAGNOSIS — F3181 Bipolar II disorder: Secondary | ICD-10-CM

## 2021-10-14 DIAGNOSIS — T7800XD Anaphylactic reaction due to unspecified food, subsequent encounter: Secondary | ICD-10-CM

## 2021-10-14 DIAGNOSIS — F5101 Primary insomnia: Secondary | ICD-10-CM

## 2021-10-14 NOTE — Telephone Encounter (Signed)
Chelsea Benson filled 08/10/21

## 2021-10-17 ENCOUNTER — Encounter: Payer: Self-pay | Admitting: Cardiology

## 2021-10-17 NOTE — Assessment & Plan Note (Addendum)
Stable lipid panel with LDL of 79.  Pretty much at target goal.  Remains on pravastatin.  If numbers go up, would probably convert to more potent statin, but for now continue current meds.  Mild improvement in A1c from September 2020-down to 7 from 7.3. She is also lost 10 pounds on Ozempic in addition to metformin.  Labs to be followed by PCP.  A lot of her neurologic symptoms are notably improved with the addition of pregabalin.

## 2021-10-17 NOTE — Assessment & Plan Note (Addendum)
She seems to be maintaining sinus rhythm.  Very fleeting episodes of A. fib.  Rates is well controlled on current dose of diltiazem.  Not having to use additional short acting dose.  She still has it PRN.  Plan: Continue current dose of diltiazem XT 200 mg daily with PRN 60 mg tablets as needed for breakthroughs.  Continue Eliquis.-No bleeding issues.

## 2021-10-17 NOTE — Assessment & Plan Note (Signed)
Blood pressure is pretty high today.  Unusual.  At home it is usually much better controlled. I have asked that she monitor her home blood pressures continuously as she is doing.  If they do trend up, would probably need to add ARB

## 2021-10-17 NOTE — Assessment & Plan Note (Signed)
Thankfully, her coronary CT angiogram back in 2020 showed coronary calcium score of 0  Although the calcium score is 0, she does have increased risk with borderline obesity, hypertension and diabetes (essentially metabolic syndrome).  LDL goal should be less than 100, closer to 70.  Close to control blood pressure and glycemic control as well as lipids is important.

## 2021-10-17 NOTE — Assessment & Plan Note (Signed)
Normal coronary CTA, still has intermittent reproducible chest pain on exam.  Costochondritis is probably the main etiology.  We talked about using Tylenol or intermittent NSAIDs.  Leery of using too much in the way of NSAIDs because of the regulation.  Prefer Tylenol.

## 2021-10-26 ENCOUNTER — Ambulatory Visit: Payer: 59 | Admitting: Psychiatry

## 2021-10-27 ENCOUNTER — Ambulatory Visit (INDEPENDENT_AMBULATORY_CARE_PROVIDER_SITE_OTHER): Payer: 59 | Admitting: Psychiatry

## 2021-10-27 DIAGNOSIS — F419 Anxiety disorder, unspecified: Secondary | ICD-10-CM | POA: Diagnosis not present

## 2021-10-27 NOTE — Progress Notes (Signed)
[facility] Counselor/Therapist Progress Note  Patient ID: Chelsea Benson, MRN: [id number],    Date: 10/27/2021  Time Spent: 45 minutes   Virtual Visit via MyChart Video Note: Video Session Connected with patient by a telemedicine/telehealth application, with their informed consent, and verified patient privacy and that I am speaking with the correct person using two identifiers. I discussed the limitations, risks, security and privacy concerns of performing psychotherapy and the availability of in person appointments. I also discussed with the patient that there may be a patient responsible charge related to this service. The patient expressed understanding and agreed to proceed. I discussed the treatment planning with the patient. The patient was provided an opportunity to ask questions and all were answered. The patient agreed with the plan and demonstrated an understanding of the instructions. The patient was advised to call  our office if  symptoms worsen or feel they are in a crisis state and need immediate contact.   Therapist Location: [facility] Patient Location: home    Treatment Type: Individual Therapy  Reported Symptoms: anxiety, depression and notes some improvement with her anxiety  Mental Status Exam:  Appearance:   Casual     Behavior:  Appropriate, Sharing, and Motivated  Motor:  Normal  Speech/Language:   Clear and Coherent  Affect:  Depressed and anxious  Mood:  anxious and depressed  Thought process:  goal directed  Thought content:    Rumination  Sensory/Perceptual disturbances:    WNL  Orientation:  oriented to person, place, time/date, situation, day of week, month of year, year, and stated date of Nov. 23, 2022  Attention:  Good  Concentration:  Good and Fair  Memory:  WNL  Fund of knowledge:   Good  Insight:    Fair  Judgment:   Good  Impulse Control:  Good   Risk Assessment: Danger to Self:  No Self-injurious Behavior:  No Danger to Others: No Duty to Warn:no Physical Aggression / Violence:No  Access to Firearms a concern: No  Gang Involvement:No   Subjective: Patient today reporting some slight improvement in anxiety and also some depression.  States her anxiety is about her falling a few times and thinks it's related to a particular med and she plans to check with her doctor about this. "Have not broken anything in my fall." Using her walker for any walking and uses cane to help her stand up from chair. Worries about her health but states she does follow medical advice from her doctors. Vents a lot today about her health concerns and some relationships within her family, especially the brother that lives with her and doesn't respect her "house rules", adding that she plans to talk with him "more seriously today when he gets home from work". Continues to have people that check in on her.  Trying to reduce her long-time habit of excessive worrying that leads to assuming worst case scenarios. She feels she has progressed some "on my worrying" since she's been working on it in therapy. Admits her meds help her also with this. Feels she is also trying to take better care of herself and "be able to in better mood when I'm out in public and not be irritable."   Interventions: Solution-Oriented/Positive Psychology, Ego-Supportive, and Insight-Oriented   Treatment Goal Plan:  Patient not signing tx goals on computer screen due to COVID. Treatment Goals:   Goals remain on tx plan as patient works on strategies to meet her goals.  Progress will  be documented each visit in "Progress" section on Plan.   Long term goal: (measurable) Reduce overall level, frequency, and intensity of the anxiety so that daily functioning is not impaired.  Patient will eventually report a rating of "4" or less" on 1-10 anxiety scale for a 15-month time period where her daily functioning is not impaired.  Short term goal: Increase understanding  of beliefs and messages that produce the worry and anxiety. Strategy: Patient will explore and work to stop cognitive messages that feed anxiety and work to replace them with more positive and empowering messages.     Diagnosis:   ICD-10-CM   1. Anxiety disorder, unspecified type  F41.9      Plan:  Patient today showing good motivation and active participation in session today.  Seemed to be very grateful for the contact and did well in working on her anxiety, depression, excessive worrying about herself, health concerns, and her efforts "to be less irritable with others."  Encouraged patient in her practice of positive thoughts and positive behaviors including:  Believing in herself more, staying in contact with close neighbor friend, remaining in contact with her family especially the ones that live locally, getting out some each day and using safety precautions, practice interrupting anxious/negative thoughts as she is able  and replace them with thoughts that are more realistic and encouraging,setting appropriate boundaries with others as needed especially within the family, trying not to assume worst case scenarios, try to have a more positive or neutral outlook that allows for hoping for positive outcomes versus assuming the negative, healthy nutrition, keeping a good sleep routine, staying on her medications as prescribed, staying in the present focusing on what she can change or control, positive and encouraging self talk, spending time doing things with friends and family, and seeing the strength that she shows working with goal-directed behaviors to move in a direction that supports improved emotional health and overall wellbeing.  Goal review and progress/challenges noted with patient.  Next appointment within 2 to 3 weeks.  This record has been created using [employer].  Chart creation errors have been sought, but may not always have been located and corrected.  Such creation errors  do not reflect on the standard of medical care provided.    Shanon Ace, LCSW

## 2021-10-31 ENCOUNTER — Other Ambulatory Visit: Payer: Self-pay | Admitting: Allergy

## 2021-11-05 ENCOUNTER — Telehealth (INDEPENDENT_AMBULATORY_CARE_PROVIDER_SITE_OTHER): Payer: 59 | Admitting: Psychiatry

## 2021-11-05 ENCOUNTER — Encounter: Payer: Self-pay | Admitting: Psychiatry

## 2021-11-05 VITALS — BP 127/82 | HR 99 | Wt 171.0 lb

## 2021-11-05 DIAGNOSIS — F3181 Bipolar II disorder: Secondary | ICD-10-CM | POA: Diagnosis not present

## 2021-11-05 DIAGNOSIS — F410 Panic disorder [episodic paroxysmal anxiety] without agoraphobia: Secondary | ICD-10-CM | POA: Diagnosis not present

## 2021-11-05 DIAGNOSIS — F5101 Primary insomnia: Secondary | ICD-10-CM

## 2021-11-05 MED ORDER — DAYVIGO 10 MG PO TABS
1.0000 | ORAL_TABLET | Freq: Every day | ORAL | 1 refills | Status: DC
Start: 2021-11-05 — End: 2022-03-22

## 2021-11-05 MED ORDER — ARIPIPRAZOLE 2 MG PO TABS: 4.0000 mg | ORAL_TABLET | Freq: Every day | ORAL | 1 refills | Status: DC

## 2021-11-05 NOTE — Progress Notes (Signed)
Chelsea Benson [id number] 26-Oct-1951 70 y.o.  Virtual Visit via Video Note  I connected with pt @ on 11/05/21 at  8:30 AM EST by a video enabled telemedicine application and verified that I am speaking with the correct person using two identifiers.   I discussed the limitations of evaluation and management by telemedicine and the availability of in person appointments. The patient expressed understanding and agreed to proceed.  I discussed the assessment and treatment plan with the patient. The patient was provided an opportunity to ask questions and all were answered. The patient agreed with the plan and demonstrated an understanding of the instructions.   The patient was advised to call back or seek an in-person evaluation if the symptoms worsen or if the condition fails to improve as anticipated.  I provided 15 minutes of non-face-to-face time during this encounter.  The patient was located at home.  The provider was located at [place].   Thayer Headings, PMHNP   Subjective:   Patient ID:  Chelsea Benson is a 70 y.o. (DOB Aug 30, 1951) female.  Chief Complaint:  Chief Complaint  Patient presents with   Follow-up    Mood disturbance, anxiety, and insomnia    HPI Chelsea Benson presents for follow-up of anxiety, mood disturbance, and insomnia. She reports that she recently learned that she will need to have another back surgery and will also have eye surgery. She reports that her anxiety has been "good, actually." Mood has been stable. Sleep has improved. Appetite has been lower. She reports that she has been eating regularly. Energy and motivation have been good. Concentration has been fair. Denies SI.   She reports that her family has been doing well.   Denies any involuntary movements.   Has not needed Klonopin prn or Hydroxyzine prn recently. Taking Ambien prn regularly.   Review of Systems:  Review of Systems  Eyes:  Positive for visual disturbance.   Cardiovascular:  Negative for palpitations.       Recent dx of Afib  Musculoskeletal:  Positive for back pain and gait problem.  Neurological:  Negative for tremors.  Psychiatric/Behavioral:         Please refer to HPI   Medications: I have reviewed the patient's current medications.  Current Outpatient Medications  Medication Sig Dispense Refill   albuterol (PROVENTIL) (2.5 MG/3ML) 0.083% nebulizer solution USE 1 VIAL IN NEBULIZER EVERY 4 HOURS - and as needed 75 mL 0   albuterol (VENTOLIN HFA) 108 (90 Base) MCG/ACT inhaler USE 2 INHALATIONS BY MOUTH  EVERY 4 HOURS AS NEEDED FOR WHEEZING OR SHORTNESS OF  BREATH 40.2 g 0   apixaban (ELIQUIS) 5 MG TABS tablet Take 1 tablet (5 mg total) by mouth 2 (two) times daily. 180 tablet 3   budesonide-formoterol (SYMBICORT) 160-4.5 MCG/ACT inhaler INHALE 2 INHALATIONS BY  MOUTH INTO THE LUNGS IN THE MORNING AND AT BEDTIME 10.2 g 0   Carbinoxamine Maleate 4 MG TABS Take 1 tablet (4 mg total) by mouth in the morning and at bedtime. 60 tablet 5   CLOBETASOL PROPIONATE E 0.05 % emollient cream Apply 1 application topically 2 (two) times daily as needed (rash).      desonide (DESOWEN) 0.05 % ointment APPLY TO AFFECTED AREA(S)  TOPICALLY TWICE DAILY AS  NEEDED 180 g 1   diltiazem (CARDIZEM CD) 300 MG 24 hr capsule Take 1 capsule (300 mg total) by mouth daily. 90 capsule 0   diltiazem (CARDIZEM) 60 MG tablet TAKE 1 TABLET  BY MOUTH AS NEEDED FOR PALPITIATIONS. TAKE EVERY 6 HOURS UP TO TWICE DAILY WITH EACH EPISODE(THIS IS ADDITION TO 300MG DILITAZEM) 180 tablet 0   famotidine (PEPCID) 20 MG tablet TAKE 1 TABLET BY MOUTH DAILY  AFTER SUPPER 90 tablet 1   fluticasone (FLONASE) 50 MCG/ACT nasal spray USE 2 SPRAYS IN BOTH  NOSTRILS DAILY 16 g 0   hydrochlorothiazide (HYDRODIURIL) 25 MG tablet Take 1 tablet (25 mg total) by mouth daily. 90 tablet 3   meloxicam (MOBIC) 15 MG tablet Take 1 tablet (15 mg total) by mouth daily. 30 tablet 1   metFORMIN (GLUCOPHAGE-XR)  500 MG 24 hr tablet Take 500 mg by mouth 1 day or 1 dose.      mirtazapine (REMERON) 30 MG tablet TAKE 1 TABLET BY MOUTH AT  BEDTIME 90 tablet 3   montelukast (SINGULAIR) 10 MG tablet TAKE 1 TABLET BY MOUTH AT  BEDTIME 30 tablet 0   NUCALA 100 MG/ML SOAJ INJECT 100MG SUBCUTANEOUSLY EVERY 4 WEEKS (GIVEN AT MD  OFFICE) 1 mL 11   Oxcarbazepine (TRILEPTAL) 300 MG tablet Take 2 tablets (600 mg total) by mouth at bedtime. 180 tablet 2   pantoprazole (PROTONIX) 40 MG tablet TAKE 1 TABLET BY MOUTH  DAILY. TAKE 30 TO 60  MINUTES BEFORE FIRST MEAL  OF THE DAY 90 tablet 3   Polyethyl Glycol-Propyl Glycol (SYSTANE OP) Apply to eye.     potassium chloride SA (KLOR-CON) 20 MEQ tablet Take 1 tablet (20 mEq total) by mouth daily. 90 tablet 3   pravastatin (PRAVACHOL) 20 MG tablet Take 1 tablet (20 mg total) by mouth at bedtime. 90 tablet 3   pregabalin (LYRICA) 100 MG capsule Take 100 mg by mouth 3 (three) times daily.     Semaglutide, 1 MG/DOSE, (OZEMPIC, 1 MG/DOSE,) 2 MG/1.5ML SOPN Take 1 mg by mouth once a week.     tizanidine (ZANAFLEX) 2 MG capsule Take 1 capsule (2 mg total) by mouth 3 (three) times daily. 21 capsule 0   zolpidem (AMBIEN) 10 MG tablet Take 0.5-1 tablets (5-10 mg total) by mouth at bedtime as needed. for sleep 90 tablet 1   ACCU-CHEK GUIDE test strip TEST BID PRN  1   ARIPiprazole (ABILIFY) 2 MG tablet Take 2 tablets (4 mg total) by mouth daily. 180 tablet 1   Bepotastine Besilate (BEPREVE) 1.5 % SOLN Place 1 drop into both eyes 2 (two) times daily as needed (for itchy water eyes). (Patient not taking: Reported on 11/05/2021) 10 mL 3   Blood Glucose Monitoring Suppl (ACCU-CHEK GUIDE) w/Device KIT See admin instructions.  1   busPIRone (BUSPAR) 30 MG tablet Take 1 tablet (30 mg total) by mouth 2 (two) times daily. 180 tablet 2   Carbinoxamine Maleate 4 MG TABS Take 2 tablets (8 mg total) by mouth in the morning and at bedtime. 120 tablet 5   Carbinoxamine Maleate 4 MG TABS TAKE 2 TABLETS BY  MOUTH  TWICE DAILY AS NEEDED 360 tablet 0   clonazePAM (KLONOPIN) 0.5 MG tablet Take 1/2-1 tab po TID prn anxiety. (Do not combine with Ambien) 90 tablet 2   EPINEPHrine 0.3 mg/0.3 mL IJ SOAJ injection INJECT INTRAMUSCULARLY 1  PEN AS NEEDED FOR ALLERGIC  RESPONSE AS DIRECTED BY MD. SEEK MEDICAL HELP AFTER  USE. 2 each 0   ipratropium (ATROVENT) 0.06 % nasal spray Place 2 sprays into both nostrils 3 (three) times daily. Use for nasal drainage (Patient not taking: Reported on 11/05/2021) 45 mL 0  Lemborexant (DAYVIGO) 10 MG TABS Take 1 tablet by mouth at bedtime. 90 tablet 1   Respiratory Therapy Supplies (NEBULIZER) DEVI Use as directed with nebulizer solution. 1 each 1   Respiratory Therapy Supplies (NEBULIZER/TUBING/MOUTHPIECE) KIT Use as directed with nebulizer machine 1 kit 12   triamcinolone cream (KENALOG) 0.1 % 1 application topically below the face avoiding the groin, and underarms 45 g 0   trimethoprim-polymyxin b (POLYTRIM) ophthalmic solution INSTILL 1 DROP INTO BOTH  EYES EVERY 3 HOURS FOR 7  DAYS AND THEN STOP AS  DIRECTED (Patient not taking: Reported on 11/05/2021) 10 mL 0   No current facility-administered medications for this visit.    Medication Side Effects: None  Allergies:  Allergies  Allergen Reactions   Ramipril Other (See Comments)   Cymbalta [Duloxetine Hcl] Other (See Comments)    Caused hair loss   Methylprednisolone Acetate Hives and Rash    05/29/20 developed hives "all over my back" after MBB; resolved with Benadryl and "a few days."  Can tolerate Dexamethasone/Decadron. 05/29/20 developed hives "all over my back" after MBB; resolved with Benadryl and "a few days."  Can tolerate Dexamethasone/Decadron.   Rosuvastatin Calcium Other (See Comments) and Cough    Rapid heart rate   Almond Meal Itching and Swelling    Tongue swells and itches   Almond Oil Itching and Swelling    Itching and swelling of tongue    Carvedilol Cough   Crestor [Rosuvastatin] Cough    Fish Allergy Itching    Halibut fish   Losartan Potassium Cough   Morphine And Related Itching    Past Medical History:  Diagnosis Date   Allergic rhinoconjunctivitis    Anxiety    Arthritis    Spondylolysis, knee and ankle arthritis pains.   Asthma    Bipolar disorder ([place])    Chronic back pain    Has had several surgeries.  He uses a walker   Depression    Diabetes mellitus without complication ([place])    dx in 2007   Diabetes mellitus, type II ([place])    Eczema    Headache(784.0)    sinus related    History of nephrolithiasis    Hx of pulmonary embolus 2017   Hyperlipidemia    Hypertension    Insomnia    Memory change    OSA on CPAP    PSG 05/24/2016: AHI 17/hour 02 mean 76%.  HSAT 06/13/17, AHI 11-hour 02 mean 79%   Paroxysmal atrial fibrillation ([place]) 02/24/2021   CHA2DS2-VASc score 4 (HTN, DM, female, age-89); on Eliquis   Tremor of both hands     Family History  Problem Relation Age of Onset   Diabetes Sister    Diabetes Brother    Hypertension Brother    Cancer Maternal Grandmother    Heart failure Maternal Grandmother    Hypertension Paternal Grandmother    Asthma Daughter    Cancer Daughter    Depression Daughter    Hypertension Daughter    Heart failure Maternal Aunt    Pneumonia Mother    Diabetes Mother    Alcoholism Father    Alcohol abuse Father    Depression Daughter    Schizophrenia Grandchild    Allergies Neg Hx    Eczema Neg Hx    Immunodeficiency Neg Hx     Social History   Socioeconomic History   Marital status: Single    Spouse name: Not on file   Number of children: 2   Years of education:  Not on file   Highest education level: Some college, no degree  Occupational History    Comment: NA  Tobacco Use   Smoking status: Never    Passive exposure: Yes   Smokeless tobacco: Never   Tobacco comments:    grandson smokes, but in his car  Vaping Use   Vaping Use: Never used  Substance and Sexual Activity   Alcohol use: Yes     Comment: rare use   Drug use: No   Sexual activity: Not Currently  Other Topics Concern   Not on file  Social History Narrative   She is currently single.  Has 2 girls.  Has some college education.  Currently disabled due to chronic back pain and not employed.   07/01/19 lives with brother Shanon Brow   Caffeine, none   Social Determinants of Health   Financial Resource Strain: Not on file  Food Insecurity: Not on file  Transportation Needs: Not on file  Physical Activity: Not on file  Stress: Not on file  Social Connections: Not on file  Intimate Partner Violence: Not on file    Past Medical History, Surgical history, Social history, and Family history were reviewed and updated as appropriate.   Please see review of systems for further details on the patient's review from today.   Objective:   Physical Exam:  BP 127/82   Pulse 99   Wt 171 lb (77.6 kg)   BMI 28.68 kg/m   Physical Exam Neurological:     Mental Status: She is alert and oriented to person, place, and time.     Cranial Nerves: No dysarthria.  Psychiatric:        Attention and Perception: Attention and perception normal.        Mood and Affect: Mood normal.        Speech: Speech normal.        Behavior: Behavior is cooperative.        Thought Content: Thought content normal. Thought content is not paranoid or delusional. Thought content does not include homicidal or suicidal ideation. Thought content does not include homicidal or suicidal plan.        Cognition and Memory: Cognition and memory normal.        Judgment: Judgment normal.     Comments: Insight intact    Lab Review:     Component Value Date/Time   NA 139 07/06/2020 0834   K 4.2 07/06/2020 0834   CL 100 07/06/2020 0834   CO2 26 07/06/2020 0834   GLUCOSE 277 (H) 07/06/2020 0834   GLUCOSE 120 (H) 08/14/2019 0859   BUN 13 07/06/2020 0834   CREATININE 0.62 07/06/2020 0834   CALCIUM 10.2 07/06/2020 0834   PROT 7.3 04/28/2019 1712   ALBUMIN 4.2  04/28/2019 1712   AST 21 04/28/2019 1712   ALT 30 04/28/2019 1712   ALKPHOS 68 04/28/2019 1712   BILITOT 0.5 04/28/2019 1712   GFRNONAA 94 07/06/2020 0834   GFRAA 108 07/06/2020 0834       Component Value Date/Time   WBC 4.8 08/14/2019 0859   RBC 4.86 08/14/2019 0859   HGB 12.8 08/14/2019 0859   HGB 12.5 11/08/2018 1041   HCT 41.9 08/14/2019 0859   HCT 39.5 11/08/2018 1041   PLT 289 08/14/2019 0859   MCV 86.2 08/14/2019 0859   MCV 81 11/08/2018 1041   MCH 26.3 08/14/2019 0859   MCHC 30.5 08/14/2019 0859   RDW 14.9 08/14/2019 0859   RDW 14.3 11/08/2018 1041  LYMPHSABS 2.1 04/28/2019 1712   LYMPHSABS 1.4 11/08/2018 1041   MONOABS 0.5 04/28/2019 1712   EOSABS 0.3 04/28/2019 1712   EOSABS 0.2 11/08/2018 1041   BASOSABS 0.0 04/28/2019 1712   BASOSABS 0.0 11/08/2018 1041    No results found for: POCLITH, LITHIUM   No results found for: PHENYTOIN, PHENOBARB, VALPROATE, CBMZ   .res Assessment: Plan:   Will continue current plan of care since target signs and symptoms are well controlled without any tolerability issues. Continue Abilify 4 mg po qd for mood s/s.  Continue Buspar 30 mg po BID for anxiety. Continue Klonopin 0.5 mg 1/2-1 tab prn panic. Continue Dayvigo 10 mg at bedtime for insomnia.  Continue Remeron 30 mg po qhs for depression and insomnia.  Continue Trileptal 600 mg po QHS for mood stabilization and anxiety.  Continue Ambien 10 mg 1/2-1 tab po QHS prn insomnia.  Pt to follow-up in 3 months or sooner if clinically indicated.  Patient advised to contact office with any questions, adverse effects, or acute worsening in signs and symptoms. Recommend continuing therapy with Rinaldo Cloud, LCSW.    Tonnette was seen today for follow-up.  Diagnoses and all orders for this visit:  Bipolar II disorder ([place]) -     ARIPiprazole (ABILIFY) 2 MG tablet; Take 2 tablets (4 mg total) by mouth daily.  Primary insomnia -     Lemborexant (DAYVIGO) 10 MG TABS; Take 1 tablet  by mouth at bedtime.  Panic disorder    Please see After Visit Summary for patient specific instructions.  Future Appointments  Date Time Provider [place]  11/16/2021  8:00 AM Shanon Ace, LCSW [facility] None  12/01/2021  8:00 AM Shanon Ace, LCSW [facility] None  12/14/2021  8:00 AM Shanon Ace, LCSW [facility] None    No orders of the defined types were placed in this encounter.     -------------------------------

## 2021-11-16 ENCOUNTER — Ambulatory Visit: Payer: 59 | Admitting: Psychiatry

## 2021-11-17 ENCOUNTER — Ambulatory Visit (INDEPENDENT_AMBULATORY_CARE_PROVIDER_SITE_OTHER): Payer: 59 | Admitting: Psychiatry

## 2021-11-17 DIAGNOSIS — F3181 Bipolar II disorder: Secondary | ICD-10-CM

## 2021-11-17 NOTE — Progress Notes (Signed)
[facility] Counselor/Therapist Progress Note  Patient ID: Chelsea Benson, MRN: [id number],    Date: 11/17/2021  Time Spent: 45 minutes   Virtual Visit via Telehealth Note: VIDEO SESSION Connected with patient by a telemedicine/telehealth application, with their informed consent, and verified patient privacy and that I am speaking with the correct person using two identifiers. I discussed the limitations, risks, security and privacy concerns of performing psychotherapy and the availability of in person appointments. I also discussed with the patient that there may be a patient responsible charge related to this service. The patient expressed understanding and agreed to proceed. I discussed the treatment planning with the patient. The patient was provided an opportunity to ask questions and all were answered. The patient agreed with the plan and demonstrated an understanding of the instructions. The patient was advised to call  our office if  symptoms worsen or feel they are in a crisis state and need immediate contact.   Therapist Location: [facility] Patient Location: home   Treatment Type: Individual Therapy  Reported Symptoms:  anxiety, some depression "but not quite as bad as it was".  Mental Status Exam:  Appearance:   Casual     Behavior:  Appropriate, Sharing, and Motivated  Motor:  Normal  Speech/Language:   Clear and Coherent  Affect:  Anxious, some depression  Mood:  anxious and some depression  Thought process:  goal directed  Thought content:    Some overthinking and obsessing  Sensory/Perceptual disturbances:    WNL  Orientation:  oriented to person, place, time/date, situation, day of week, month of year, year, and stated date of Dec. 14, 2022  Attention:  Fair  Concentration:  Good and Fair  Memory:  WNL  Fund of knowledge:   Good  Insight:    Good and Fair  Judgment:   Good and Fair  Impulse Control:  Good   Risk Assessment: Danger to Self:   No Self-injurious Behavior: No Danger to Others: No Duty to Warn:no Physical Aggression / Violence:No  Access to Firearms a concern: No  Gang Involvement:No   Subjective: Patient today reporting anxiety, depression (not quite as bad), worrying, setting limits, and family issues that impact and frustrate patient.  Coping with several medical issues going on but has not fallen anymore since her last appointment.  States that brother who lives with her "has been more respectful recently and not had as many problems with him".  Appreciates the people that continue to check in on her.  Reports continued work on excessive worrying that leads to negative thoughts, and we followed up on this more in session today as well.  She feels that she is making some progress in her worrying.  Still getting out frequently and is glad for that.  Frustrated by some family situations that are "not in my control" and hard to accept that lack of control.  Affect overall is some better with more smiling at times and no tearfulness at all today.  Interventions: Solution-Oriented/Positive Psychology and Ego-Supportive  Treatment Goal Plan:  Patient not signing tx goals on computer screen due to COVID. Treatment Goals:   Goals remain on tx plan as patient works on strategies to meet her goals.  Progress will be documented each visit in "Progress" section on Plan.   Long term goal: (measurable) Reduce overall level, frequency, and intensity of the anxiety so that daily functioning is not impaired.  Patient will eventually report a rating of "4" or less"  on 1-10 anxiety scale for a 66-month time period where her daily functioning is not impaired.  Short term goal: Increase understanding of beliefs and messages that produce the worry and anxiety. Strategy: Patient will explore and work to stop cognitive messages that feed anxiety and work to replace them with more positive and empowering messages.    Diagnosis:   ICD-10-CM    1. Bipolar II disorder ([place])  F31.81      Plan: Patient today showing good motivation and actively engaged in session today as she worked further on her anxiety, depression, setting limits, excessive worrying and not assuming worst case scenarios.  Also focused on some "control" issues, understanding limits to influencing her family members in certain ways, needing to be able to "let go" more as it feeds her worrying over things out of her control. Expecting to have some type of procedure done in reference to a "disc in my back" and also her cataract surgery was postponed until rescheduled. Encouraging patient to continue efforts in practicing positive behaviors including: Staying in contact with supportive people, believing in herself more and her ability to make changes, frequent contact with family, maintain contact with close neighbor friend, getting out some each day and use safety precautions, interrupt anxious/negative thoughts and replace with more realistic and empowering thoughts, keep appropriate boundaries with others,  decrease her assuming worst case scenarios, allow for good sleep rountine, trying to have a more positive/neutral outlook that allows more hope for positive outcomes, healthy nutrition, stay on meds a prescribed, stay in the present focusing on what she can change versus cannot change, use more positive/encouraging self-talk, and feel good about the strength she shows working with goal-directed behaviors to move in a direction that supports overall wellbeing and improved emotional health.  Goal review and progress/challenges noted with patient.  Next appointment within 2 to 3 weeks.  This record has been created using [employer].  Chart creation errors have been sought, but may not always have been located and corrected.  Such creation errors do not reflect on the standard of medical care provided.  Shanon Ace, LCSW

## 2021-11-28 ENCOUNTER — Other Ambulatory Visit: Payer: Self-pay | Admitting: Allergy

## 2021-11-30 ENCOUNTER — Other Ambulatory Visit: Payer: Self-pay | Admitting: Cardiology

## 2021-12-01 ENCOUNTER — Ambulatory Visit (INDEPENDENT_AMBULATORY_CARE_PROVIDER_SITE_OTHER): Payer: 59 | Admitting: Psychiatry

## 2021-12-01 DIAGNOSIS — F3181 Bipolar II disorder: Secondary | ICD-10-CM

## 2021-12-01 NOTE — Progress Notes (Signed)
[facility] Counselor/Therapist Progress Note  Patient ID: Chelsea Benson, MRN: [id number],    Date: 12/01/2021  Time Spent: 48 minutes  Virtual Visit via Telehealth VIDEO Note Connected with patient by a telemedicine/telehealth application, with their informed consent, and verified patient privacy and that I am speaking with the correct person using two identifiers. I discussed the limitations, risks, security and privacy concerns of performing psychotherapy and the availability of in person appointments. I also discussed with the patient that there may be a patient responsible charge related to this service. The patient expressed understanding and agreed to proceed. I discussed the treatment planning with the patient. The patient was provided an opportunity to ask questions and all were answered. The patient agreed with the plan and demonstrated an understanding of the instructions. The patient was advised to call  our office if  symptoms worsen or feel they are in a crisis state and need immediate contact.   Therapist Location: [facility] Patient Location: home   Treatment Type: Individual Therapy  Reported Symptoms: anxiety (improving), some "holiday blues and depression and couldn't go anywhere"  Mental Status Exam:  Appearance:   Casual     Behavior:  Appropriate, Sharing, and Motivated  Motor:  Uses rollater when walking  Speech/Language:   Clear and Coherent  Affect:  Some anxiousness and depression (but improving)  Mood:  anxious and depressed  Thought process:  goal directed  Thought content:    Rumination  Sensory/Perceptual disturbances:    WNL  Orientation:  oriented to person, place, time/date, situation, day of week, month of year, year, and stated date of Dec. 28, 2022  Attention:  Good  Concentration:  Good  Memory:  Improving and "I'm taking Prevagen"  Fund of knowledge:   Good  Insight:    Good and Fair  Judgment:   Good  Impulse Control:   Good and Fair   Risk Assessment: Danger to Self:  No Self-injurious Behavior: No Danger to Others: No Duty to Warn:no Physical Aggression / Violence:No  Access to Firearms a concern: No  Gang Involvement:No   Subjective:  Patient today reporting anxiety and some holiday blues and depression, and adds that both symptoms are improving some. Sharing today her "holiday blues" and not being able to see all of her loved ones at the holidays. Especially concerned about some of her family members which she shared today and processed ways of setting limits but also being supportive in healthier ways, and letting go of some of her "worrying" about things out of her control. Depression "not quite as bad other than the holiday blues". Also talked through her current medical issues including cataract surgery pending and some back issues. Has family and neighbors that stay in contact with patient. Has not fallen anymore "in over a month now." Thinks her falling was med-related and is no longer that medication. Reports brother is still living with her but having difficulty respecting patient's "house rules". Mood seemed to elevate some during session and her outlook was more positive towards session end and encouraged to remain in contact with supportive people in her life.    Interventions: Solution-Oriented/Positive Psychology and Ego-Supportive  Treatment Goal Plan:  Patient not signing tx goals on computer screen due to [place]. Treatment Goals:   Goals remain on tx plan as patient works on strategies to meet her goals.  Progress will be documented each visit in "Progress" section on Plan.   Long term goal: (measurable) Reduce overall  level, frequency, and intensity of the anxiety so that daily functioning is not impaired.  Patient will eventually report a rating of "4" or less" on 1-10 anxiety scale for a 58-month time period where her daily functioning is not impaired.  Short term goal: Increase  understanding of beliefs and messages that produce the worry and anxiety. Strategy: Patient will explore and work to stop cognitive messages that feed anxiety and work to replace them with more positive and empowering messages.    Diagnosis:   ICD-10-CM   1. Bipolar II disorder ([facility])  F31.81      Plan:  Patient today showing good motivation and participated well in session working on her issues of anxiety, excessive worrying, managing the holiday blues, and depression.  Does report that some of her anxiety and depression is decreasing.  Hard to let go of things that are "out of my control" and also hard not to assume worst case scenarios.  States that she is trying to stop this as we discussed how it feeds her worry, anxiety, and depression. Processed some of her anxiety regarding upcoming cataract surgery and a back procedure.  Encouraged patient and her practice of positive behaviors including: Refraining from jumping to conclusions and assuming worst case scenarios, staying in contact with supportive people, believing in herself more in her ability to make changes, frequent contact with family that are healthy for her, maintain contact with a close neighbor friend, getting out some each day using safety precautions as she is able, interrupt anxious/negative thoughts and replace with more realistic and empowering thoughts, keep appropriate boundaries with others, allow for good sleep routines, try to have a more positive/neutral outlook that allows for more hope and positive outcomes, healthy nutrition, stay on meds as prescribed, stay in the present focusing on what she can change versus cannot change, use more positive/encouraging self talk, and recognize the strengths she shows working with goal-directed behaviors to move in a direction that supports overall improved emotional health and wellbeing.   Review and progress/challenges noted the patient.  Next appointment within 2 to 3 weeks.  This  record has been created using [employer].  Chart creation errors have been sought, but may not always have been located and corrected.  Such creation errors do not reflect on the standard of medical care provided.   Mathis Fare, LCSW

## 2021-12-10 ENCOUNTER — Ambulatory Visit
Admission: EM | Admit: 2021-12-10 | Discharge: 2021-12-10 | Disposition: A | Discharge status: Home / Self Care | Payer: 59 | Attending: Physician Assistant | Admitting: Physician Assistant

## 2021-12-10 ENCOUNTER — Other Ambulatory Visit: Payer: Self-pay

## 2021-12-10 DIAGNOSIS — M5441 Lumbago with sciatica, right side: Secondary | ICD-10-CM

## 2021-12-10 MED ORDER — MELOXICAM 7.5 MG PO TABS
7.5000 mg | ORAL_TABLET | Freq: Every day | ORAL | 0 refills | Status: DC
Start: 2021-12-10 — End: 2023-10-25

## 2021-12-10 MED ORDER — TIZANIDINE HCL 4 MG PO TABS: 4.0000 mg | ORAL_TABLET | Freq: Four times a day (QID) | ORAL | 0 refills | Status: DC | PRN

## 2021-12-10 NOTE — ED Triage Notes (Signed)
Pt c/o fall on right side 3-4 weeks ago. States waiting for CT scan. Whoever is ordering the scan also gave her a taper steroid tx. Pt states she has finished the steroids and pain has returned. Here for pain management.

## 2021-12-10 NOTE — ED Provider Notes (Signed)
[name]    CSN: [id number] Arrival date & time: 12/10/21  0840      History   Chief Complaint Chief Complaint  Patient presents with   Fall    HPI Chelsea Benson is a 71 y.o. female.   Patient here today for evaluation of right sided low back pain that started a few weeks ago after fall. She states she has been having muscle spasms and at times pain will radiate into her right leg. She is currently awaiting further imaging. She does not report any numbness. She has tried prednisone but has finished course. She is currently not taking meloxicam listed on med list.  The history is provided by the patient.  Fall Pertinent negatives include no shortness of breath.   Past Medical History:  Diagnosis Date   Allergic rhinoconjunctivitis    Anxiety    Arthritis    Spondylolysis, knee and ankle arthritis pains.   Asthma    Bipolar disorder ([place])    Chronic back pain    Has had several surgeries.  He uses a walker   Depression    Diabetes mellitus without complication ([place])    dx in 2007   Diabetes mellitus, type II ([place])    Eczema    Headache(784.0)    sinus related    History of nephrolithiasis    Hx of pulmonary embolus 2017   Hyperlipidemia    Hypertension    Insomnia    Memory change    OSA on CPAP    PSG 05/24/2016: AHI 17/hour 02 mean 76%.  HSAT 06/13/17, AHI 11-hour 02 mean 79%   Paroxysmal atrial fibrillation (HCC) 02/24/2021   CHA2DS2-VASc score 4 (HTN, DM, female, age-29); on Eliquis   Tremor of both hands     Patient Active Problem List   Diagnosis Date Noted   Paroxysmal atrial fibrillation ([place]); CHA2DS2-VASc score 4 HTN, DM, female, age)-on Eliquis 02/24/2021   Leg cramps - Left Calf 12/31/2020   Pain of anterior chest wall with respiration 06/17/2020   Rapid palpitations 06/17/2020   Acute pharyngitis 01/13/2020   Cough variant asthma vs UACS 06/06/2019   Moderate persistent asthma with acute exacerbation 03/13/2019   Allergic  rhinitis due to allergen 03/13/2019   Allergic conjunctivitis of both eyes 03/13/2019   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   Anxiety hyperventilation 10/03/2018   Bipolar II disorder ([place]) 09/18/2018   Cardiac risk counseling 01/07/2018   Influenza 10/19/2017   Cough 10/19/2017   Allergy to almonds 10/19/2017   Flexural atopic dermatitis 10/19/2017   Allergic rhinoconjunctivitis 10/19/2017   Moderate persistent asthma, uncomplicated 58/08/9832   S/P arthroscopy of right knee 06/04/2017   Acute blood loss as cause of postoperative anemia 06/04/2017   Mood disorder ([place])    Allergy history unknown    Anxiety 05/24/2017   Diabetes mellitus ([place]) 05/24/2017   Hyperlipidemia associated with type 2 diabetes mellitus ([place]) 05/24/2017   Insomnia 05/24/2017   Primary osteoarthritis of right knee 05/22/2017   Osteoarthritis of right knee 05/18/2017   Allergic rhinitis due to pollen 10/06/2014   Arthritis 10/06/2014   Asthma 10/06/2014   Cubital tunnel syndrome, right 10/06/2014   Headache 10/06/2014   Essential hypertension 10/06/2014   Depression 08/07/2014   Postlaminectomy syndrome, lumbar region 09/06/2013   DDD (degenerative disc disease), lumbosacral 05/11/2013   Spinal stenosis of lumbar region 05/11/2013    Past Surgical History:  Procedure Laterality Date   ABDOMINAL HYSTERECTOMY  BACK SURGERY     2012- lumbar fusion   BREAST SURGERY     L cyst removed - 1972   BUNIONECTOMY Left    Great toe   calf -R- cyst removed     CARPAL TUNNEL RELEASE     both hands    CORONARY CT ANGIOGRAM  07/17/2019    Coronary calcium score 0.  Right dominant system.  Difficult to assess because of cardiac motion, but there is no obvious evidence of CAD.  Mild PA dilation.    HAMMER TOE SURGERY Left    L foot   NASAL SINUS SURGERY     NM MYOVIEW LTD  12/2008   Normal study.  Normal LV function.  EF 61%.  Apical thinning but no ischemia or infarction.   SHOULDER  ARTHROSCOPY Left    R shoulder- RCR   TOE SURGERY     TOTAL KNEE ARTHROPLASTY Right 05/22/2017   Procedure: TOTAL KNEE ARTHROPLASTY;  Surgeon: Frederik Pear, MD;  Location: [facility];  Service: Orthopedics;  Laterality: Right;   TRANSTHORACIC ECHOCARDIOGRAM  06/2009    Technically difficult.  Moderate concentric LVH with normal LV function.  No regional wall motion normalities.  EF> 55%.  Normal right ventricle.  No valve lesions.  Normal filling pressures.   TRIGGER FINGER RELEASE     L thumb   TUBAL LIGATION      OB History   No obstetric history on file.      Home Medications    Prior to Admission medications   Medication Sig Start Date End Date Taking? Authorizing Provider  meloxicam (MOBIC) 7.5 MG tablet Take 1 tablet (7.5 mg total) by mouth daily. 12/10/21  Yes Francene Finders, PA-C  tiZANidine (ZANAFLEX) 4 MG tablet Take 1 tablet (4 mg total) by mouth every 6 (six) hours as needed for muscle spasms. 12/10/21  Yes Francene Finders, PA-C  ACCU-CHEK GUIDE test strip TEST BID PRN 09/17/18   [provider]  albuterol (PROVENTIL) (2.5 MG/3ML) 0.083% nebulizer solution USE 1 VIAL IN NEBULIZER EVERY 4 HOURS - and as needed 07/06/21   Kennith Gain, MD  albuterol (VENTOLIN HFA) 108 (90 Base) MCG/ACT inhaler USE 2 INHALATIONS BY MOUTH  EVERY 4 HOURS AS NEEDED FOR WHEEZING OR SHORTNESS OF  BREATH 09/24/21   Padgett, Rae Halsted, MD  apixaban (ELIQUIS) 5 MG TABS tablet Take 1 tablet (5 mg total) by mouth 2 (two) times daily. 09/29/21   Leonie Man, MD  ARIPiprazole (ABILIFY) 2 MG tablet Take 2 tablets (4 mg total) by mouth daily. 11/05/21   Thayer Headings, PMHNP  Bepotastine Besilate (BEPREVE) 1.5 % SOLN Place 1 drop into both eyes 2 (two) times daily as needed (for itchy water eyes). Patient not taking: Reported on 11/05/2021 08/18/21   Kennith Gain, MD  Blood Glucose Monitoring Suppl (ACCU-CHEK GUIDE) w/Device KIT See admin instructions. 09/18/18    [provider]  budesonide-formoterol (SYMBICORT) 160-4.5 MCG/ACT inhaler INHALE 2 INHALATIONS BY  MOUTH INTO THE LUNGS IN THE MORNING AND AT BEDTIME 10/14/21   Kennith Gain, MD  busPIRone (BUSPAR) 30 MG tablet Take 1 tablet (30 mg total) by mouth 2 (two) times daily. 08/06/21 11/04/21  Thayer Headings, PMHNP  Carbinoxamine Maleate 4 MG TABS Take 1 tablet (4 mg total) by mouth in the morning and at bedtime. 04/26/21   Kennith Gain, MD  Carbinoxamine Maleate 4 MG TABS Take 2 tablets (8 mg total) by mouth in the morning and  at bedtime. 04/26/21   Kennith Gain, MD  Carbinoxamine Maleate 4 MG TABS TAKE 2 TABLETS BY MOUTH  TWICE DAILY AS NEEDED 10/14/21   Ambs, Kathrine Cords, FNP  CLOBETASOL PROPIONATE E 0.05 % emollient cream Apply 1 application topically 2 (two) times daily as needed (rash).  01/22/19   [provider]  clonazePAM (KLONOPIN) 0.5 MG tablet Take 1/2-1 tab po TID prn anxiety. (Do not combine with Ambien) 08/06/21   Thayer Headings, PMHNP  desonide (DESOWEN) 0.05 % ointment APPLY TO AFFECTED AREA(S)  TOPICALLY TWICE DAILY AS  NEEDED 02/03/21   Kennith Gain, MD  diltiazem (CARDIZEM CD) 300 MG 24 hr capsule Take 1 capsule (300 mg total) by mouth daily. 09/29/21   Leonie Man, MD  diltiazem (CARDIZEM) 60 MG tablet TAKE 1 TABLET BY MOUTH AS NEEDED FOR PALPITIATIONS. TAKE EVERY 6 HOURS UP TO TWICE DAILY WITH EACH EPISODE(THIS IS ADDITION TO 300MG DILITAZEM) 08/31/21   Leonie Man, MD  EPINEPHrine 0.3 mg/0.3 mL IJ SOAJ injection INJECT INTRAMUSCULARLY 1  PEN AS NEEDED FOR ALLERGIC  RESPONSE AS DIRECTED BY MD. [place] AFTER  USE. 10/14/21   Kennith Gain, MD  famotidine (PEPCID) 20 MG tablet TAKE 1 TABLET BY MOUTH DAILY  AFTER SUPPER 11/01/21   Kennith Gain, MD  fluticasone [facility]) 50 MCG/ACT nasal spray USE 2 SPRAYS IN BOTH  NOSTRILS DAILY 10/14/21   Kennith Gain, MD  hydrochlorothiazide  (HYDRODIURIL) 25 MG tablet TAKE 1 TABLET(25 MG) BY MOUTH DAILY 11/30/21   Leonie Man, MD  ipratropium (ATROVENT) 0.06 % nasal spray Place 2 sprays into both nostrils 3 (three) times daily. Use for nasal drainage Patient not taking: Reported on 11/05/2021 07/21/21   Kennith Gain, MD  Lemborexant (DAYVIGO) 10 MG TABS Take 1 tablet by mouth at bedtime. 11/05/21   Thayer Headings, PMHNP  metFORMIN (GLUCOPHAGE-XR) 500 MG 24 hr tablet Take 500 mg by mouth 1 day or 1 dose.  07/02/19   [provider]  mirtazapine (REMERON) 30 MG tablet TAKE 1 TABLET BY MOUTH AT  BEDTIME 08/06/21   Thayer Headings, PMHNP  montelukast (SINGULAIR) 10 MG tablet TAKE 1 TABLET BY MOUTH AT  BEDTIME 11/30/21   Padgett, Rae Halsted, MD  NUCALA 100 MG/ML SOAJ INJECT 100MG SUBCUTANEOUSLY EVERY 4 WEEKS (GIVEN AT MD  OFFICE) 02/16/21   Kennith Gain, MD  Oxcarbazepine (TRILEPTAL) 300 MG tablet Take 2 tablets (600 mg total) by mouth at bedtime. 08/06/21 02/02/22  Thayer Headings, PMHNP  pantoprazole (PROTONIX) 40 MG tablet TAKE 1 TABLET BY MOUTH  DAILY. TAKE 30 TO 60  MINUTES BEFORE FIRST MEAL  OF THE DAY 10/25/19   Tanda Rockers, MD  Polyethyl Glycol-Propyl Glycol (SYSTANE OP) Apply to eye.    [provider]  potassium chloride SA (KLOR-CON) 20 MEQ tablet Take 1 tablet (20 mEq total) by mouth daily. 09/29/21   Leonie Man, MD  pravastatin (PRAVACHOL) 20 MG tablet Take 1 tablet (20 mg total) by mouth at bedtime. 09/29/21   Leonie Man, MD  pregabalin (LYRICA) 100 MG capsule Take 100 mg by mouth 3 (three) times daily.    [provider]  Respiratory Therapy Supplies (NEBULIZER) DEVI Use as directed with nebulizer solution. 06/01/18   Valentina Shaggy, MD  Respiratory Therapy Supplies (NEBULIZER/TUBING/MOUTHPIECE) KIT Use as directed with nebulizer machine 04/17/20   Padgett, Rae Halsted, MD  Semaglutide, 1 MG/DOSE, (OZEMPIC, 1 MG/DOSE,) 2 MG/1.5ML SOPN Take 1  mg by  mouth once a week.    [provider]  triamcinolone cream (KENALOG) 0.1 % 1 application topically below the face avoiding the groin, and underarms 07/06/21   Padgett, Rae Halsted, MD  trimethoprim-polymyxin b (POLYTRIM) ophthalmic solution INSTILL 1 DROP INTO BOTH  EYES EVERY 3 HOURS FOR 7  DAYS AND THEN STOP AS  DIRECTED Patient not taking: Reported on 11/05/2021 10/14/21   Kennith Gain, MD  zolpidem (AMBIEN) 10 MG tablet Take 0.5-1 tablets (5-10 mg total) by mouth at bedtime as needed. for sleep 08/06/21   Thayer Headings, PMHNP    Family History Family History  Problem Relation Age of Onset   Diabetes Sister    Diabetes Brother    Hypertension Brother    Cancer Maternal Grandmother    Heart failure Maternal Grandmother    Hypertension Paternal Grandmother    Asthma Daughter    Cancer Daughter    Depression Daughter    Hypertension Daughter    Heart failure Maternal Aunt    Pneumonia Mother    Diabetes Mother    Alcoholism Father    Alcohol abuse Father    Depression Daughter    Schizophrenia Grandchild    Allergies Neg Hx    Eczema Neg Hx    Immunodeficiency Neg Hx     Social History Social History   Tobacco Use   Smoking status: Never    Passive exposure: Yes   Smokeless tobacco: Never   Tobacco comments:    grandson smokes, but in his car  Vaping Use   Vaping Use: Never used  Substance Use Topics   Alcohol use: Yes    Comment: rare use   Drug use: No     Allergies   Ramipril, Cymbalta [duloxetine hcl], Methylprednisolone acetate, Rosuvastatin calcium, Almond meal, Almond oil, Carvedilol, Crestor [rosuvastatin], Fish allergy, Losartan potassium, and Morphine and related   Review of Systems Review of Systems  Constitutional:  Negative for chills and fever.  Eyes:  Negative for discharge and redness.  Respiratory:  Negative for shortness of breath.   Musculoskeletal:  Positive for back pain and myalgias.    Physical Exam Triage  Vital Signs ED Triage Vitals  Enc Vitals Group     BP 12/10/21 0857 139/89     Pulse Rate 12/10/21 0857 95     Resp 12/10/21 0857 18     Temp 12/10/21 0857 98.2 F (36.8 C)     Temp Source 12/10/21 0857 Oral     SpO2 12/10/21 0857 96 %     Weight --      Height --      Head Circumference --      Peak Flow --      Pain Score 12/10/21 0858 0     Pain Loc --      Pain Edu? --      Excl. in [place]? --    No data found.  Updated Vital Signs BP 139/89 (BP Location: Left Arm)    Pulse 95    Temp 98.2 F (36.8 C) (Oral)    Resp 18    SpO2 96%       Physical Exam Vitals and nursing note reviewed.  Constitutional:      General: She is not in acute distress.    Appearance: Normal appearance. She is not ill-appearing.  HENT:     Head: Normocephalic and atraumatic.  Eyes:     Conjunctiva/sclera: Conjunctivae normal.  Cardiovascular:  Rate and Rhythm: Normal rate.  Pulmonary:     Effort: Pulmonary effort is normal.  Musculoskeletal:     Comments: Mild TTP to right lower back. Patient uses cane for walking assistance  Neurological:     Mental Status: She is alert.     UC Treatments / Results  Labs (all labs ordered are listed, but only abnormal results are displayed) Labs Reviewed - No data to display  EKG   Radiology No results found.  Procedures Procedures (including critical care time)  Medications Ordered in UC Medications - No data to display  Initial Impression / Assessment and Plan / UC Course  I have reviewed the triage vital signs and the nursing notes.  Pertinent labs & imaging results that were available during my care of the patient were reviewed by me and considered in my medical decision making (see chart for details).   Will treat with muscle relaxer and low dose meloxicam to hopefully aid in improvement of symptoms while awaiting imaging. Recommend follow up with PCP if symptoms fail to improve or worsen.   Final Clinical Impressions(s) / UC  Diagnoses   Final diagnoses:  Acute right-sided low back pain with right-sided sciatica   Discharge Instructions   None    ED Prescriptions     Medication Sig Dispense Auth. Provider   tiZANidine (ZANAFLEX) 4 MG tablet Take 1 tablet (4 mg total) by mouth every 6 (six) hours as needed for muscle spasms. 30 tablet Ewell Poe F, PA-C   meloxicam (MOBIC) 7.5 MG tablet Take 1 tablet (7.5 mg total) by mouth daily. 30 tablet Francene Finders, PA-C      PDMP not reviewed this encounter.   Francene Finders, PA-C 12/10/21 1041

## 2021-12-13 ENCOUNTER — Telehealth: Payer: Self-pay | Admitting: *Deleted

## 2021-12-13 ENCOUNTER — Other Ambulatory Visit: Payer: Self-pay | Admitting: Neurosurgery

## 2021-12-13 NOTE — Telephone Encounter (Signed)
Patient with diagnosis of afib on Eliquis for anticoagulation.    Procedure: lumbar fusion Date of procedure: 12/27/21  CHA2DS2-VASc Score = 4  This indicates a 4.8% annual risk of stroke. The patient's score is based upon: CHF History: 0 HTN History: 1 Diabetes History: 1 Stroke History: 0 Vascular Disease History: 0 Age Score: 1 Gender Score: 1   Also with hx of PE in 2017.  CrCl 60mL/min using adjusted body weight  Per office protocol, patient can hold Eliquis for 3 days prior to procedure.

## 2021-12-13 NOTE — Telephone Encounter (Addendum)
° °  Primary Cardiologist: Glenetta Hew, MD  Chart reviewed as part of pre-operative protocol coverage. Given past medical history and time since last visit, based on ACC/AHA guidelines, TRANA STRID would be at acceptable risk for the planned procedure without further cardiovascular testing.   Patient with diagnosis of afib on Eliquis for anticoagulation.     Procedure: lumbar fusion Date of procedure: 12/27/21   CHA2DS2-VASc Score = 4  This indicates a 4.8% annual risk of stroke. The patient's score is based upon: CHF History: 0 HTN History: 1 Diabetes History: 1 Stroke History: 0 Vascular Disease History: 0 Age Score: 1 Gender Score: 1   Also with hx of PE in 2017.   CrCl 58mL/min using adjusted body weight   Per office protocol, patient can hold Eliquis for 3 days prior to procedure.   Patient was advised that if she develops new symptoms prior to surgery to contact our office to arrange a follow-up appointment.  She verbalized understanding.  I will route this recommendation to the requesting party via Epic fax function and remove from pre-op pool.  Please call with questions.  Jossie Ng. Zyrion Coey NP-C    12/13/2021, 3:55 PM [address] Office [phone] Fax [phone]

## 2021-12-13 NOTE — Telephone Encounter (Signed)
° °  Pre-operative Risk Assessment    Patient Name: Chelsea Benson  DOB: [birth year] MRN: [id number]      Request for Surgical Clearance    Procedure:  LUMBAR FUSION  Date of Surgery:  Clearance 12/27/21                                 Surgeon:  DR. Donalee Citrin Surgeon's Group or Practice Name:  [facility] Phone number:  [phone] Fax number:  [phone] ATTN [id number]   Type of Clearance Requested:   - Medical  - Pharmacy:  Hold Apixaban (Eliquis)     Type of Anesthesia:  General    Additional requests/questions:      Elpidio Anis   12/13/2021, 2:15 PM

## 2021-12-14 ENCOUNTER — Ambulatory Visit (INDEPENDENT_AMBULATORY_CARE_PROVIDER_SITE_OTHER): Payer: 59 | Admitting: Psychiatry

## 2021-12-14 DIAGNOSIS — F3181 Bipolar II disorder: Secondary | ICD-10-CM

## 2021-12-14 NOTE — Progress Notes (Addendum)
[facility] Counselor/Therapist Progress Note  Patient ID: Chelsea Benson, MRN: [id number],    Date: 12/14/2021  Time Spent: 47 minutes  Virtual Visit via Telehealth Note:  Telephone only, patient not able to do video Connected with patient by a telemedicine/telehealth application, with their informed consent, and verified patient privacy and that I am speaking with the correct person using two identifiers. I discussed the limitations, risks, security and privacy concerns of performing psychotherapy and the availability of in person appointments. I also discussed with the patient that there may be a patient responsible charge related to this service. The patient expressed understanding and agreed to proceed. I discussed the treatment planning with the patient. The patient was provided an opportunity to ask questions and all were answered. The patient agreed with the plan and demonstrated an understanding of the instructions. The patient was advised to call  our office if  symptoms worsen or feel they are in a crisis state and need immediate contact.   Therapist Location: [facility] Patient Location: home  Treatment Type: Individual Therapy  Reported Symptoms: anxiety "a lot about me and upcoming surgery with Dr. Saintclair Halsted on Jan. 23 for my back for spinal stenosis.  Also worried about family concerns and recent family member's death.  Mental Status Exam:  Appearance:   Casual     Behavior:  Appropriate, Sharing, and Motivated  Motor:  Some limitations due to pain in back and hip  Speech/Language:   Clear and Coherent  Affect:  Depressed and anxious  Mood:  anxious and depressed  Thought process:  goal directed  Thought content:    overthinking  Sensory/Perceptual disturbances:    WNL  Orientation:  oriented to person, place, time/date, situation, day of week, month of year, year, and stated date of Jan. 10th  Attention:  Good  Concentration:  Good and Fair  Memory:   WNL  Fund of knowledge:   Good  Insight:    Good and Fair  Judgment:   Good  Impulse Control:  Good   Risk Assessment: Danger to Self:  No Self-injurious Behavior: No Danger to Others: No Duty to Warn:no Physical Aggression / Violence:No  Access to Firearms a concern: No  Gang Involvement:No   Subjective: Patient today reporting anxiety escalated with another upcoming back surgery within 2 weeks.  Concerned that she could end up in wheelchair and working today on not assuming the "worst case". 5 yr old cousin died recently and that has been an extra "stress for me to deal with, as she dropped dead while talking on the phone, not sure what happened to her." States talking through these concerns helps her a lot, and "my faith helps and being with other family members." Processed well her anxiety and grief, looking at what helps and what doesn't help. Still working to have healthy boundaries with some family, as well as people outside her family. Trying to decrease her worrying about things out of her control. States she's trying more to not assume how things will turn out but hard to do consistently. Has not fallen anymore since our last appt and reports she is continuing to be careful. Frequent contact with family and neighbors. Brother still living with her and seems to be following patient's rules about no late night cooking as he has in the past he has not been very careful. Outlook is more positive after talking at length today with improved self-confidence.   Interventions: Solution-Oriented/Positive Psychology and Ego-Supportive  Treatment Goals:   Goals remain on tx plan as patient works on strategies to meet her goals.  Progress will be documented each visit in "Progress" section on Plan.   Long term goal: (measurable) Reduce overall level, frequency, and intensity of the anxiety so that daily functioning is not impaired.  Patient will eventually report a rating of "4" or less" on 1-10  anxiety scale for a 64-month time period where her daily functioning is not impaired.  Short term goal: Increase understanding of beliefs and messages that produce the worry and anxiety. Strategy: Patient will explore and work to stop cognitive messages that feed anxiety and work to replace them with more positive and empowering messages.   Diagnosis:   ICD-10-CM   1. Bipolar II disorder ([place])  F31.81      Plan:  Patient today showing good motivation and active participation in session as she worked on her issues of anxiety, often assuming the worst, excessive worrying, and practicing better emotional self-care.  As noted above, patient was able to talk through multiple issues that were contributing to her anxiety and worrying.  Responded well and working with goal-directed behaviors stated above, to improving her emotional self-care.  Difficulty letting go of things that are out of her control, and worked on this some more today.  Further processed her anxieties in reference to her upcoming back surgery and cataract surgery over the next few weeks, with back surgery being scheduled for January 23.  Encouraged patient in her practice of positive behaviors including: Believing in herself more and recognizing her strengths more than her limitations, refraining from jumping to conclusions and assuming worst case scenarios, staying in contact with supportive people, reflecting more often on her progress emotionally, frequent contact with family that are healthy for her, maintain contact with a close neighbor friend, getting outside some each day even if it is just on her porch which patient states is helpful, interrupt anxious/negative thoughts and replace with more realistic and empowering thoughts, keep appropriate boundaries with others, allow for good sleep patterns, healthy nutrition, staying on meds as prescribed, staying in the present focusing on what she can control versus cannot, use more  positive/encouraging self talk, and see the strength she shows working with goal-directed behaviors to move in a direction that supports overall improved emotional health.  Goal review and progress/challenges noted with patient.  Next appointment within 2 to 3 weeks.  This record has been created using [employer].  Chart creation errors have been sought, but may not always have been located and corrected.  Such creation errors do not reflect on the standard of medical care provided.  Shanon Ace, LCSW

## 2021-12-16 ENCOUNTER — Other Ambulatory Visit: Payer: Self-pay | Admitting: Allergy

## 2021-12-16 NOTE — Telephone Encounter (Signed)
Called and spoke with patient. She has been scheduled for a televisit for tomorrow.

## 2021-12-16 NOTE — Telephone Encounter (Signed)
Called and spoke with patient and advised that she is due for an office visit in order to receive more refills and that I can send in a courtesy refill of the albuterol. Patient verbalized understanding and stated that she is in extreme pain with her back and is having back surgery on January 23rd. I advised I would talk with Dr. Delorse Lek and see if we can do a telephone visit to help her get her refills until she is well enough to come back into office for an OV. Are you ok with this plan?

## 2021-12-17 ENCOUNTER — Ambulatory Visit (INDEPENDENT_AMBULATORY_CARE_PROVIDER_SITE_OTHER): Payer: 59 | Admitting: Allergy

## 2021-12-17 ENCOUNTER — Other Ambulatory Visit: Payer: Self-pay

## 2021-12-17 ENCOUNTER — Encounter: Payer: Self-pay | Admitting: Allergy

## 2021-12-17 ENCOUNTER — Telehealth: Payer: Self-pay | Admitting: Cardiology

## 2021-12-17 VITALS — BP 133/88 | HR 98 | Temp 97.8°F

## 2021-12-17 DIAGNOSIS — J3089 Other allergic rhinitis: Secondary | ICD-10-CM | POA: Diagnosis not present

## 2021-12-17 DIAGNOSIS — T50905D Adverse effect of unspecified drugs, medicaments and biological substances, subsequent encounter: Secondary | ICD-10-CM

## 2021-12-17 DIAGNOSIS — H1013 Acute atopic conjunctivitis, bilateral: Secondary | ICD-10-CM

## 2021-12-17 DIAGNOSIS — L2089 Other atopic dermatitis: Secondary | ICD-10-CM

## 2021-12-17 DIAGNOSIS — J4540 Moderate persistent asthma, uncomplicated: Secondary | ICD-10-CM | POA: Diagnosis not present

## 2021-12-17 DIAGNOSIS — T7800XD Anaphylactic reaction due to unspecified food, subsequent encounter: Secondary | ICD-10-CM

## 2021-12-17 MED ORDER — BEPOTASTINE BESILATE 1.5 % OP SOLN: 1.0000 [drp] | Freq: Two times a day (BID) | OPHTHALMIC | 3 refills | Status: DC | PRN

## 2021-12-17 MED ORDER — CARBINOXAMINE MALEATE 4 MG PO TABS: 1.0000 | ORAL_TABLET | Freq: Two times a day (BID) | ORAL | 5 refills | Status: DC

## 2021-12-17 MED ORDER — MONTELUKAST SODIUM 10 MG PO TABS: 10.0000 mg | ORAL_TABLET | Freq: Every day | ORAL | 5 refills | Status: DC

## 2021-12-17 MED ORDER — FLUTICASONE PROPIONATE 50 MCG/ACT NA SUSP: 2.0000 | Freq: Every day | NASAL | 5 refills | Status: DC

## 2021-12-17 MED ORDER — BUDESONIDE-FORMOTEROL FUMARATE 160-4.5 MCG/ACT IN AERO: INHALATION_SPRAY | RESPIRATORY_TRACT | 5 refills | Status: DC

## 2021-12-17 MED ORDER — ALBUTEROL SULFATE HFA 108 (90 BASE) MCG/ACT IN AERS: 2.0000 | INHALATION_SPRAY | RESPIRATORY_TRACT | 1 refills | Status: DC | PRN

## 2021-12-17 MED ORDER — IPRATROPIUM BROMIDE 0.06 % NA SOLN: NASAL | 1 refills | Status: DC

## 2021-12-17 NOTE — Telephone Encounter (Signed)
Follow Up:    Patient said she received a letter from her surgeon telling  her to hold her Eliquis for 7 days and she said she was told by PA here to hold for 3 days. Which one, does she do?

## 2021-12-17 NOTE — Patient Instructions (Addendum)
Allergic rhinitis with conjunctivitis -Continue Flonase 2 sprays daily as needed.  Use flonase for 1-2 weeks at a time before stopping once symptoms improve. -Continue nasal Atrovent 2 sprays each nostril up to 3-4 times a day as needed for nasal drainage control - Continue saline nasal rinses once a day for nasal symptoms. Use before nasal sprays  -Continue carbinoxamine (RyVent) 6 mg twice a day. - Continue Singulair 10 mg at bedtime -Can use Bepreve 1 drop in each twice a day as needed for watery, itchy, red eyes - Continue allergen avoidance measures   Moderate persistent asthma with exacerbation - Continue Symbicort 160 - 2 puffs twice daily - Continue singulair as above  - have access to albuterol inhaler 2 puffs every 4-6 hours as needed for cough/wheeze/shortness of breath/chest tightness.  May use 15-20 minutes prior to activity.   Monitor frequency of use.   -Continue Nucala monthly autoinjector to allow for ease of home administration.  Daugther is giving her injections once a month.    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 - Carry EpiPen at all times.   Flexural atopic dermatitis - Continue moisturizing routine with Aveeno - Use triamcinolone 0.1% below the face.   Adverse effect of drug  -Continue to avoid Depo-Medrol   Follow up in 2 6 months or sooner if needed.

## 2021-12-17 NOTE — Progress Notes (Signed)
RE: Chelsea Benson MRN: [id number] DOB: 12-01-1951 Date of Telemedicine Visit: 12/17/2021  Referring provider: Marda Stalker, PA-C Primary care provider: Marda Stalker, PA-C  Chief Complaint: Asthma (No issues ) and Sinus Problem (Headaches, water/ itchiy  eyes, congestion ) So if he can do it totally by himself he will  Telemedicine Follow Up Visit via Telephone: I connected with Chelsea Benson for a follow up on 12/17/21 by telephone and verified that I am speaking with the correct person using two identifiers.   I discussed the limitations, risks, security and privacy concerns of performing an evaluation and management service by telephone and the availability of in person appointments. I also discussed with the patient that there may be a patient responsible charge related to this service. The patient expressed understanding and agreed to proceed.  Patient is at home Provider is at the office.  Visit start time: 11:25 Visit end time: 11:40 Insurance consent/check in by: Elmyra Ricks Medical consent and medical assistant/nurse: Diandra  History of Present Illness: She is a 71 y.o. female, who is being followed for asthma, allergic rhinitis with conjunctivitis, nut allergy, atopic dermatitis. Her previous allergy visit was a telemedicine visit on 04/08/21 with Dr. Nelva Bush.  That she was due for an office visit and was in need of refills.  She states she is having back surgery on 12/27/2021 and is also set to have cataract surgery on 1 eye on 02/01/2022 and the other eye on 02/24/2022.  She does not have any allergy or asthma symptoms complaints at this time.  She states her asthma has been doing well.  She rarely uses her albuterol like once every 3 months or so.  She continues on Symbicort 2 puffs twice a day her Singulair daily and Nucala once a month.  Her Nucala is administered at home by her daughter.  They are not having any issues with this. She states her allergy symptoms have  been doing well other than some nasal drainage.  She states she does use the Atrovent does help.  She also has Flonase that she uses as well as her carbinoxamine.  With her eyes she states she did have a bad case of pinkeye since the last visit and we did see an ophthalmologist in relationship to this.  They are the ones who will be doing her cataract surgery as well. She does continue to avoid all meds and has not needed to use her epinephrine device. She states she has been having some eczema flares but the triamcinolone ointment does help when she uses it.  Assessment and Plan: Chelsea Benson is a 71 y.o. female with: Allergic rhinitis with conjunctivitis -Continue Flonase 2 sprays daily as needed.  Use flonase for 1-2 weeks at a time before stopping once symptoms improve. -Continue nasal Atrovent 2 sprays each nostril up to 3-4 times a day as needed for nasal drainage control - Continue saline nasal rinses once a day for nasal symptoms. Use before nasal sprays  -Continue carbinoxamine (RyVent) 6 mg twice a day. - Continue Singulair 10 mg at bedtime -Can use Bepreve 1 drop in each twice a day as needed for watery, itchy, red eyes - Continue allergen avoidance measures   Moderate persistent asthma with exacerbation - Continue Symbicort 160 - 2 puffs twice daily - Continue singulair as above  - have access to albuterol inhaler 2 puffs every 4-6 hours as needed for cough/wheeze/shortness of breath/chest tightness.  May use 15-20 minutes prior to activity.   Monitor frequency  of use.   -Continue Nucala monthly autoinjector to allow for ease of home administration.  Daugther is giving her injections once a month.    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 - Carry EpiPen at all times.   Flexural atopic dermatitis - Continue moisturizing routine with Aveeno - Use triamcinolone 0.1% below the face.   Adverse effect of drug  -Continue to avoid Depo-Medrol   Follow up in 6 months or sooner if needed.     Diagnostics: None.  Medication List:  Current Outpatient Medications  Medication Sig Dispense Refill   ACCU-CHEK GUIDE test strip TEST BID PRN  1   albuterol (PROVENTIL) (2.5 MG/3ML) 0.083% nebulizer solution USE 1 VIAL IN NEBULIZER EVERY 4 HOURS - and as needed 75 mL 0   apixaban (ELIQUIS) 5 MG TABS tablet Take 1 tablet (5 mg total) by mouth 2 (two) times daily. 180 tablet 3   ARIPiprazole (ABILIFY) 2 MG tablet Take 2 tablets (4 mg total) by mouth daily. 180 tablet 1   Bepotastine Besilate (BEPREVE) 1.5 % SOLN Place 1 drop into both eyes 2 (two) times daily as needed (for itchy water eyes). 10 mL 3   Blood Glucose Monitoring Suppl (ACCU-CHEK GUIDE) w/Device KIT See admin instructions.  1   Carbinoxamine Maleate 4 MG TABS Take 2 tablets (8 mg total) by mouth in the morning and at bedtime. 120 tablet 5   Carbinoxamine Maleate 4 MG TABS TAKE 2 TABLETS BY MOUTH  TWICE DAILY AS NEEDED 360 tablet 0   Carbinoxamine Maleate 4 MG TABS Take 1 tablet (4 mg total) by mouth in the morning and at bedtime. 60 tablet 5   CLOBETASOL PROPIONATE E 0.05 % emollient cream Apply 1 application topically 2 (two) times daily as needed (rash).      clonazePAM (KLONOPIN) 0.5 MG tablet Take 1/2-1 tab po TID prn anxiety. (Do not combine with Ambien) 90 tablet 2   desonide (DESOWEN) 0.05 % ointment APPLY TO AFFECTED AREA(S)  TOPICALLY TWICE DAILY AS  NEEDED 180 g 1   diltiazem (CARDIZEM CD) 300 MG 24 hr capsule Take 1 capsule (300 mg total) by mouth daily. 90 capsule 0   diltiazem (CARDIZEM) 60 MG tablet TAKE 1 TABLET BY MOUTH AS NEEDED FOR PALPITIATIONS. TAKE EVERY 6 HOURS UP TO TWICE DAILY WITH EACH  EPISODE(THIS IS ADDITION TO 300MG DILITAZEM) 180 tablet 0   EPINEPHrine 0.3 mg/0.3 mL IJ SOAJ injection INJECT INTRAMUSCULARLY 1  PEN AS NEEDED FOR ALLERGIC  RESPONSE AS DIRECTED BY MD. SEEK MEDICAL HELP AFTER  USE. 2 each 0   famotidine (PEPCID) 20 MG tablet TAKE 1 TABLET BY MOUTH DAILY  AFTER SUPPER 90 tablet 1   hydrochlorothiazide (HYDRODIURIL) 25 MG tablet TAKE 1 TABLET(25 MG) BY MOUTH DAILY 90 tablet 3   Lemborexant (DAYVIGO) 10 MG TABS Take 1 tablet by mouth at bedtime. 90 tablet 1   meloxicam (MOBIC) 7.5 MG tablet Take 1 tablet (7.5 mg total) by mouth daily. 30 tablet 0   metFORMIN (GLUCOPHAGE-XR) 500 MG 24 hr tablet Take 500 mg by mouth 1 day or 1 dose.      mirtazapine (REMERON) 30 MG tablet TAKE 1 TABLET BY  MOUTH AT  BEDTIME 90 tablet 3   NUCALA 100 MG/ML SOAJ INJECT 100MG SUBCUTANEOUSLY EVERY 4 WEEKS (GIVEN AT MD  OFFICE) 1 mL 11   Oxcarbazepine (TRILEPTAL) 300 MG tablet Take 2 tablets (600 mg total) by mouth at bedtime. 180 tablet 2   pantoprazole (PROTONIX) 40 MG tablet TAKE 1 TABLET BY MOUTH  DAILY. TAKE 30 TO 60  MINUTES BEFORE FIRST MEAL  OF THE DAY 90 tablet 3   Polyethyl Glycol-Propyl Glycol (SYSTANE OP) Apply to eye.     potassium chloride SA (KLOR-CON) 20 MEQ tablet Take 1 tablet (20 mEq total) by mouth daily. 90 tablet 3   pravastatin (PRAVACHOL) 20 MG tablet Take 1 tablet (20 mg total) by mouth at bedtime. 90 tablet 3   pregabalin (LYRICA) 100 MG capsule Take 100 mg by mouth 3 (three) times daily.     Respiratory Therapy Supplies (NEBULIZER) DEVI Use as directed with nebulizer solution. 1 each 1   Respiratory Therapy Supplies (NEBULIZER/TUBING/MOUTHPIECE) KIT Use as directed with nebulizer machine 1 kit 12   Semaglutide, 1 MG/DOSE, (OZEMPIC, 1 MG/DOSE,) 2 MG/1.5ML SOPN Take 1 mg by mouth once a week.     tiZANidine (ZANAFLEX) 4 MG tablet Take 1 tablet (4 mg total) by mouth every 6 (six) hours as needed for muscle spasms. 30 tablet 0   triamcinolone cream (KENALOG) 0.1 % 1  application topically below the face avoiding the groin, and underarms 45 g 0   trimethoprim-polymyxin b (POLYTRIM) ophthalmic solution INSTILL 1 DROP INTO BOTH  EYES EVERY 3 HOURS FOR 7  DAYS AND THEN STOP AS  DIRECTED 10 mL 0   zolpidem (AMBIEN) 10 MG tablet Take 0.5-1 tablets (5-10 mg total) by mouth at bedtime as needed. for sleep 90 tablet 1   albuterol (VENTOLIN HFA) 108 (90 Base) MCG/ACT inhaler Inhale 2 puffs into the lungs every 4 (four) hours as needed for wheezing or shortness of breath. 18 g 1   budesonide-formoterol (SYMBICORT) 160-4.5 MCG/ACT inhaler INHALE 2 INHALATIONS BY  MOUTH INTO THE LUNGS IN THE MORNING AND AT BEDTIME 10.2 g 5   busPIRone (BUSPAR) 30 MG tablet Take 1 tablet (30 mg total) by mouth 2 (two) times daily. 180 tablet 2   fluticasone (FLONASE) 50 MCG/ACT nasal spray Place 2 sprays into both nostrils daily. 16 g 5   ipratropium (ATROVENT) 0.06 % nasal spray 2 sprays each nostril up to 3-4 times a day as needed for nasal drainage control 45 mL 1   montelukast (SINGULAIR) 10 MG tablet Take 1 tablet (10 mg total) by mouth at bedtime. 30 tablet 5   No current facility-administered medications for this visit.   Allergies: Allergies  Allergen Reactions   Ramipril Other (See Comments)   Cymbalta [Duloxetine Hcl] Other (See Comments)    Caused hair loss   Methylprednisolone Acetate Hives and Rash    05/29/20 developed hives "all over my back" after MBB; resolved with Benadryl and "a few days."  Can tolerate Dexamethasone/Decadron. 05/29/20 developed hives "all over my back" after MBB; resolved with Benadryl and "a few days."  Can tolerate Dexamethasone/Decadron.   Rosuvastatin Calcium Other (See Comments) and Cough    Rapid heart rate   Almond Meal Itching and Swelling    Tongue swells and itches   Almond Oil Itching and Swelling    Itching and swelling of tongue    Carvedilol Cough   Crestor [Rosuvastatin] Cough   Fish Allergy Itching    Halibut fish   Losartan  Potassium Cough   Morphine And Related Itching   I reviewed her past medical history, social history, family history, and environmental history and no significant changes have been reported from previous visit on 04/08/21.  Review of Systems  Constitutional: Negative.   HENT:  Positive for postnasal drip.   Eyes: Negative.   Respiratory: Negative.    Cardiovascular: Negative.   Gastrointestinal: Negative.   Musculoskeletal:  Positive for back pain.  Skin: Negative.   Allergic/Immunologic: Negative.   Neurological: Negative.   Objective: Physical Exam Not obtained as encounter was done via telephone.   Previous notes and tests were reviewed.  I discussed the assessment and treatment plan with the patient. The patient was provided an opportunity to ask questions and all were answered. The patient agreed with the plan and demonstrated an understanding of the instructions.   The patient was advised to call back or seek an in-person evaluation if the symptoms worsen or if the condition fails to improve as anticipated.  I provided 15 minutes of non-face-to-face time during this encounter.  It was my pleasure to participate in [place] care today. Please feel free to contact me with any questions or concerns.   Sincerely,  Star Resler Charmian Muff, MD;

## 2021-12-17 NOTE — Telephone Encounter (Signed)
I have spoken with Dr. Lonie Peak surgery scheduler Erie Noe who confirms 3 day hold of Eliquis prior to the surgery is fine and she will contact the patient to make correction to the holding time to 3 days, not 7 days. I attempted to call Mrs. Schrimpf earlier but the call went to voicemail. Neurosurgery will call the patient to let her know.

## 2021-12-20 NOTE — Telephone Encounter (Signed)
I s/w the pt and help clarify for her the hold time for Eliquis for her upcoming procedure. Pt is aware that with her procedure being on 12/27/21 that he last dose of Eliquis will be 12/23/21, she will then hold Eliquis 1/20, 1/21, 1/22. Procedure 12/27/21 and she will resume Eliquis once surgeon feels from a bleeding stand point. Pt thanked me for the call and the help.

## 2021-12-20 NOTE — Telephone Encounter (Signed)
Patient calling to clarify when she holds her eliquis. She says she got a message from Dr. Lonie Peak office, but it was confusing.

## 2021-12-22 NOTE — Progress Notes (Signed)
Surgical Instructions    Your procedure is scheduled on Monday, January 23rd, 2023.   Report to [facility] Main Entrance "A" at 05:30 A.M., then check in with the Admitting office.  Call this number if you have problems the morning of surgery:  [phone]   If you have any questions prior to your surgery date call [phone]: Open Monday-Friday 8am-4pm    Remember:  Do not eat after midnight the night before your surgery  You may drink clear liquids until 04:30 the morning of your surgery.   Clear liquids allowed are: Water, Non-Citrus Juices (without pulp), Carbonated Beverages, Clear Tea, Black Coffee ONLY (NO MILK, CREAM OR POWDERED CREAMER of any kind), and Gatorade    Take these medicines the morning of surgery with A SIP OF WATER:  ARIPiprazole (ABILIFY)  budesonide-formoterol (SYMBICORT)  busPIRone (BUSPAR) diltiazem (CARDIZEM CD)  fluticasone (FLONASE)  oxybutynin (DITROPAN-XL)  pantoprazole (PROTONIX) pregabalin (LYRICA)    If needed:   albuterol (PROVENTIL) albuterol (VENTOLIN HFA)  Bepotastine Besilate (BEPREVE) clonazePAM (KLONOPIN) diltiazem (CARDIZEM)  EPINEPHrine  ipratropium (ATROVENT) tiZANidine (ZANAFLEX)  Polyethyl Glycol-Propyl Glycol (SYSTANE OP)  Please bring all inhalers with you the day of surgery.     Eliquis - last dose on 12/23/2021 per MD  As of today, STOP taking any Aspirin (unless otherwise instructed by your surgeon) Aleve, Naproxen, Ibuprofen, Motrin, Advil, Goody's, BC's, all herbal medications, fish oil, and all vitamins.   WHAT DO I DO ABOUT MY DIABETES MEDICATION?   Do not take metFORMIN (GLUCOPHAGE-XR) the morning of surgery.  Do not take Semaglutide the day of surgery   HOW TO MANAGE YOUR DIABETES BEFORE AND AFTER SURGERY  Why is it important to control my blood sugar before and after surgery? Improving blood sugar levels before and after surgery helps healing and can limit problems. A way of improving blood  sugar control is eating a healthy diet by:  Eating less sugar and carbohydrates  Increasing activity/exercise  Talking with your doctor about reaching your blood sugar goals High blood sugars (greater than 180 mg/dL) can raise your risk of infections and slow your recovery, so you will need to focus on controlling your diabetes during the weeks before surgery. Make sure that the doctor who takes care of your diabetes knows about your planned surgery including the date and location.  How do I manage my blood sugar before surgery? Check your blood sugar at least 4 times a day, starting 2 days before surgery, to make sure that the level is not too high or low.  Check your blood sugar the morning of your surgery when you wake up and every 2 hours until you get to the Short Stay unit.  If your blood sugar is less than 70 mg/dL, you will need to treat for low blood sugar: Do not take insulin. Treat a low blood sugar (less than 70 mg/dL) with  cup of clear juice (cranberry or apple), 4 glucose tablets, OR glucose gel. Recheck blood sugar in 15 minutes after treatment (to make sure it is greater than 70 mg/dL). If your blood sugar is not greater than 70 mg/dL on recheck, call [phone] for further instructions. Report your blood sugar to the short stay nurse when you get to Short Stay.  If you are admitted to the hospital after surgery: Your blood sugar will be checked by the staff and you will probably be given insulin after surgery (instead of oral diabetes medicines) to make sure you have good blood sugar  levels. The goal for blood sugar control after surgery is 80-180 mg/dL.   After your COVID test   You are not required to quarantine however you are required to wear a well-fitting mask when you are out and around people not in your household.  If your mask becomes wet or soiled, replace with a new one.  Wash your hands often with soap and water for 20 seconds or clean your hands with an  alcohol-based hand sanitizer that contains at least 60% alcohol.  Do not share personal items.  Notify your provider: if you are in close contact with someone who has COVID  or if you develop a fever of 100.4 or greater, sneezing, cough, sore throat, shortness of breath or body aches.           Do not wear jewelry or makeup Do not wear lotions, powders, perfumes/colognes, or deodorant. Do not shave 48 hours prior to surgery.  Men may shave face and neck. Do not bring valuables to the hospital. DO Not wear nail polish, gel polish, artificial nails, or any other type of covering on natural nails (fingers and toes) If you have artificial nails or gel coating that need to be removed by a nail salon, please have this removed prior to surgery. Artificial nails or gel coating may interfere with anesthesia's ability to adequately monitor your vital signs.             [facility] is not responsible for any belongings or valuables.  Do NOT Smoke (Tobacco/Vaping)  24 hours prior to your procedure  If you use a CPAP at night, you may bring your mask for your overnight stay.   Contacts, glasses, hearing aids, dentures or partials may not be worn into surgery, please bring cases for these belongings   For patients admitted to the hospital, discharge time will be determined by your treatment team.   Patients discharged the day of surgery will not be allowed to drive home, and someone needs to stay with them for 24 hours.  NO VISITORS WILL BE ALLOWED IN PRE-OP WHERE PATIENTS ARE PREPPED FOR SURGERY.  ONLY 1 SUPPORT PERSON MAY BE PRESENT IN THE WAITING ROOM WHILE YOU ARE IN SURGERY.  IF YOU ARE TO BE ADMITTED, ONCE YOU ARE IN YOUR ROOM YOU WILL BE ALLOWED TWO (2) VISITORS. 1 (ONE) VISITOR MAY STAY OVERNIGHT BUT MUST ARRIVE TO THE ROOM BY 8pm.  Minor children may have two parents present. Special consideration for safety and communication needs will be reviewed on a case by case basis.  Special  instructions:    Oral Hygiene is also important to reduce your risk of infection.  Remember - BRUSH YOUR TEETH THE MORNING OF SURGERY WITH YOUR REGULAR TOOTHPASTE   [place]- Preparing For Surgery  Before surgery, you can play an important role. Because skin is not sterile, your skin needs to be as free of germs as possible. You can reduce the number of germs on your skin by washing with CHG (chlorahexidine gluconate) Soap before surgery.  CHG is an antiseptic cleaner which kills germs and bonds with the skin to continue killing germs even after washing.     Please do not use if you have an allergy to CHG or antibacterial soaps. If your skin becomes reddened/irritated stop using the CHG.  Do not shave (including legs and underarms) for at least 48 hours prior to first CHG shower. It is OK to shave your face.  Please follow these instructions  carefully.     Shower the NIGHT BEFORE SURGERY and the MORNING OF SURGERY with CHG Soap.   If you chose to wash your hair, wash your hair first as usual with your normal shampoo. After you shampoo, rinse your hair and body thoroughly to remove the shampoo.  Then [employer] and genitals (private parts) with your normal soap and rinse thoroughly to remove soap.  After that Use CHG Soap as you would any other liquid soap. You can apply CHG directly to the skin and wash gently with a scrungie or a clean washcloth.   Apply the CHG Soap to your body ONLY FROM THE NECK DOWN.  Do not use on open wounds or open sores. Avoid contact with your eyes, ears, mouth and genitals (private parts). Wash Face and genitals (private parts)  with your normal soap.   Wash thoroughly, paying special attention to the area where your surgery will be performed.  Thoroughly rinse your body with warm water from the neck down.  DO NOT shower/wash with your normal soap after using and rinsing off the CHG Soap.  Pat yourself dry with a CLEAN TOWEL.  Wear CLEAN PAJAMAS to bed the  night before surgery  Place CLEAN SHEETS on your bed the night before your surgery  DO NOT SLEEP WITH PETS.   Day of Surgery:  Take a shower with CHG soap. Wear Clean/Comfortable clothing the morning of surgery Do not apply any deodorants/lotions.   Remember to brush your teeth WITH YOUR REGULAR TOOTHPASTE.   Please read over the following fact sheets that you were given.

## 2021-12-23 ENCOUNTER — Other Ambulatory Visit: Payer: Self-pay | Admitting: Allergy

## 2021-12-23 ENCOUNTER — Encounter (HOSPITAL_COMMUNITY)
Admission: RE | Admit: 2021-12-23 | Discharge: 2021-12-23 | Disposition: A | Discharge status: Home / Self Care | Payer: 59 | Source: Ambulatory Visit | Attending: Neurosurgery | Admitting: Neurosurgery

## 2021-12-23 ENCOUNTER — Encounter (HOSPITAL_COMMUNITY): Payer: Self-pay

## 2021-12-23 ENCOUNTER — Other Ambulatory Visit: Payer: Self-pay

## 2021-12-23 VITALS — BP 116/74 | HR 95 | Temp 97.7°F | Resp 18 | Ht 64.0 in | Wt 167.7 lb

## 2021-12-23 DIAGNOSIS — Z7951 Long term (current) use of inhaled steroids: Secondary | ICD-10-CM | POA: Diagnosis not present

## 2021-12-23 DIAGNOSIS — Z20822 Contact with and (suspected) exposure to covid-19: Secondary | ICD-10-CM | POA: Insufficient documentation

## 2021-12-23 DIAGNOSIS — Z01818 Encounter for other preprocedural examination: Secondary | ICD-10-CM

## 2021-12-23 DIAGNOSIS — Z01812 Encounter for preprocedural laboratory examination: Secondary | ICD-10-CM | POA: Diagnosis present

## 2021-12-23 DIAGNOSIS — E785 Hyperlipidemia, unspecified: Secondary | ICD-10-CM | POA: Insufficient documentation

## 2021-12-23 DIAGNOSIS — I480 Paroxysmal atrial fibrillation: Secondary | ICD-10-CM | POA: Insufficient documentation

## 2021-12-23 DIAGNOSIS — G4733 Obstructive sleep apnea (adult) (pediatric): Secondary | ICD-10-CM | POA: Insufficient documentation

## 2021-12-23 DIAGNOSIS — E119 Type 2 diabetes mellitus without complications: Secondary | ICD-10-CM | POA: Insufficient documentation

## 2021-12-23 DIAGNOSIS — Z7901 Long term (current) use of anticoagulants: Secondary | ICD-10-CM | POA: Diagnosis not present

## 2021-12-23 DIAGNOSIS — J4540 Moderate persistent asthma, uncomplicated: Secondary | ICD-10-CM | POA: Diagnosis not present

## 2021-12-23 DIAGNOSIS — I10 Essential (primary) hypertension: Secondary | ICD-10-CM | POA: Insufficient documentation

## 2021-12-23 HISTORY — DX: Angina pectoris, unspecified: I20.9

## 2021-12-23 HISTORY — DX: Cardiac arrhythmia, unspecified: I49.9

## 2021-12-23 LAB — TYPE AND SCREEN
ABO/RH(D): A POS
Antibody Screen: NEGATIVE

## 2021-12-23 LAB — BASIC METABOLIC PANEL
Anion gap: 8 (ref 5–15)
BUN: 16 mg/dL (ref 8–23)
CO2: 28 mmol/L (ref 22–32)
Calcium: 10 mg/dL (ref 8.9–10.3)
Chloride: 106 mmol/L (ref 98–111)
Creatinine, Ser: 0.82 mg/dL (ref 0.44–1.00)
GFR, Estimated: >60 mL/min (ref >60)
Glucose, Bld: 149 mg/dL — ABNORMAL HIGH (ref 70–99)
Potassium: 3.6 mmol/L (ref 3.5–5.1)
Sodium: 142 mmol/L (ref 135–145)

## 2021-12-23 LAB — GLUCOSE, CAPILLARY: Glucose-Capillary: 186 mg/dL — ABNORMAL HIGH (ref 70–99)

## 2021-12-23 LAB — SARS CORONAVIRUS 2 (TAT 6-24 HRS): SARS Coronavirus 2: NEGATIVE

## 2021-12-23 LAB — CBC
HCT: 40.8 % (ref 36.0–46.0)
Hemoglobin: 12.5 g/dL (ref 12.0–15.0)
MCH: 26.4 pg (ref 26.0–34.0)
MCHC: 30.6 g/dL (ref 30.0–36.0)
MCV: 86.1 fL (ref 80.0–100.0)
Platelets: 297 10*3/uL (ref 150–400)
RBC: 4.74 MIL/uL (ref 3.87–5.11)
RDW: 15.7 % — ABNORMAL HIGH (ref 11.5–15.5)
WBC: 4.2 10*3/uL (ref 4.0–10.5)
nRBC: 0 % (ref 0.0–0.2)

## 2021-12-23 LAB — HEMOGLOBIN A1C
Hgb A1c MFr Bld: 6.6 % — ABNORMAL HIGH (ref 4.8–5.6)
Mean Plasma Glucose: 142.72 mg/dL

## 2021-12-23 LAB — SURGICAL PCR SCREEN
MRSA, PCR: NEGATIVE
Staphylococcus aureus: NEGATIVE

## 2021-12-23 NOTE — Progress Notes (Signed)
PCP - Jarrett Soho, PA-C Cardiologist - Bryan Lemma, MD  PPM/ICD - denies Device Orders - n/a Rep Notified - n/a  Chest x-ray - n/a EKG - 09/29/2021 Stress Test - 03/23/2016 - [facility] ECHO - 07/05/2016 - [facility] Cardiac Cath - denies  Sleep Study - yes, positive for OSA CPAP - yes  Fasting Blood Sugar - 70 - 90 Checks Blood Sugar 2 times a day CBG today - 186 A1C - done in PAT 12/23/2020  Blood Thinner Instructions: Eliquis - last day - 12/23/2021 per MD  Aspirin Instructions: Patient was instructed: As of today, STOP taking any Aspirin (unless otherwise instructed by your surgeon) Aleve, Naproxen, Ibuprofen, Motrin, Advil, Goody's, BC's, all herbal medications, fish oil, and all vitamins.  ERAS Protcol - yes PRE-SURGERY Ensure or G2- n/a  COVID TEST- done in PAT on 12/23/2021   Anesthesia review: yes - cardiac clearance on 12/13/2021  Patient denies shortness of breath, fever, cough and chest pain at PAT appointment   All instructions explained to the patient, with a verbal understanding of the material. Patient agrees to go over the instructions while at home for a better understanding. Patient also instructed to self quarantine after being tested for COVID-19. The opportunity to ask questions was provided.

## 2021-12-24 ENCOUNTER — Ambulatory Visit (INDEPENDENT_AMBULATORY_CARE_PROVIDER_SITE_OTHER): Payer: 59 | Admitting: Psychiatry

## 2021-12-24 DIAGNOSIS — F419 Anxiety disorder, unspecified: Secondary | ICD-10-CM

## 2021-12-24 NOTE — Progress Notes (Signed)
[facility] Counselor/Therapist Progress Note  Patient ID: Chelsea Benson, MRN: [id number],    Date: 12/24/2021  Time Spent: 50 minutes   Virtual Visit via Telehealth (Video) Note: MyChart VIDEO Note Connected with patient by a telemedicine/telehealth application, with their informed consent, and verified patient privacy and that I am speaking with the correct person using two identifiers. I discussed the limitations, risks, security and privacy concerns of performing psychotherapy and the availability of in person appointments. I also discussed with the patient that there may be a patient responsible charge related to this service. The patient expressed understanding and agreed to proceed. I discussed the treatment planning with the patient. The patient was provided an opportunity to ask questions and all were answered. The patient agreed with the plan and demonstrated an understanding of the instructions. The patient was advised to call  our office if  symptoms worsen or feel they are in a crisis state and need immediate contact.   Therapist Location: [facility] Patient Location: home    Treatment Type: Individual Therapy  Reported Symptoms: anxiety, "no depression"  Mental Status Exam:  Appearance:   Neat     Behavior:  Appropriate, Sharing, and Motivated  Motor:  Normal  Speech/Language:   Clear and Coherent  Affect:  anxiety  Mood:  anxious  Thought process:  goal directed  Thought content:    WNL  Sensory/Perceptual disturbances:    WNL  Orientation:  oriented to person, place, time/date, situation, day of week, month of year, year, and stated date of Jan. 20, 2023  Attention:  Good  Concentration:  Fair  Memory:  [place] of knowledge:   Good  Insight:    Good and Fair  Judgment:   Good  Impulse Control:  Good   Risk Assessment: Danger to Self:  No Self-injurious Behavior: No Danger to Others: No Duty to Warn:no Physical Aggression /  Violence:No  Access to Firearms a concern: No  Gang Involvement:No    Subjective:  Patient today reporting anxiety with upcoming back surgery and processed her thoughts/feelings/uncertainties about having her 4th back surgery. Outlook is more positive than at prior session. Not making as many assumptions that "things may go wrong" but is actually staying more on the optimistic, hopeful side at this point and hoping to maintain this as she knows "I tend to [facility]".  Also processed further her sadness and shock of the sudden unexpected death of a cousin recently,leaving behind a 65yrold child. Continued work on having healthier boundaries with some family members and feels she is making some progress.Also reporting she has "managed my worrying better lately" and "I'm really trying hard on this especially when things are out of my control." Admits that "control issues" have typically been difficult for patient but adds that she is "some better with it now." Asked about any further falling and patient reports "no". Maintains contact with family and neighbors that are supportive of her. Family members will be staying with her a while after her upcoming surgery.   Interventions: Solution-Oriented/Positive Psychology and Ego-Supportive   Treatment Goals:   Goals remain on tx plan as patient works on strategies to meet her goals.  Progress will be documented each visit in "Progress" section on Plan.   Long term goal: (measurable) Reduce overall level, frequency, and intensity of the anxiety so that daily functioning is not impaired.  Patient will eventually report a rating of "4" or less" on 1-10 anxiety scale for  a 47-monthtime period where her daily functioning is not impaired.  Short term goal: Increase understanding of beliefs and messages that produce the worry and anxiety. Strategy: Patient will explore and work to stop cognitive messages that feed anxiety and work to replace them with more  positive and empowering messages.   Diagnosis:   ICD-10-CM   1. Anxiety disorder, unspecified type  F41.9      Plan: Patient today showing good motivation and was actively involved in session working on her issues of anxiety, and showing definite improvement today.  She has previously been more worried and fearful regarding upcoming surgery, assuming worst case scenarios.  Today, however she presents feeling more hopeful and optimistic and looking forward to not having as much pain.  Some of her family that are supportive will be with her in the days right after surgery to make sure her needs are met.  Continues to set limits with others and family as needed.  As noted above, she today is more aware of her tendency to over worry and assume negatives, but today there was very little of that expressed and she seemed to be at a better place emotionally, which she also recognized and commented on.  Encouraged her in practicing more positive behaviors including: Reflect on her progress often, for every negative thought create 2 positives, believe in herself more and recognize her strengths more than her limitations, refraining from jumping to conclusions and assuming worst case scenarios, staying in contact with people that are supportive, have frequent contact with family members that are healthy for her and set limits with others as needed, maintain contact with a close neighbor friend who is helpful to her, get outside some each day when possible as she enjoys being on her porch, interrupt anxious/negative thoughts and replace with more realistic and empowering thoughts, allow for good sleep patterns, healthy nutrition, staying on her meds as prescribed, staying in the present focusing on what she can change, use more positive/encouraging self talk, and feel good about the strength she shows working with goal-directed behaviors to move in a direction that supports overall improved emotional health.  Goal  review and progress/challenges noted with patient.  Next appointment within 2 to 3 weeks.  This record has been created using [employer]  Chart creation errors have been sought, but may not always have been located and corrected.  Such creation errors do not reflect on the standard of medical care provided.  DShanon Ace LCSW

## 2021-12-24 NOTE — Anesthesia Preprocedure Evaluation (Addendum)
Anesthesia Evaluation  Patient identified by MRN, date of birth, ID band Patient awake    Reviewed: Allergy & Precautions, NPO status , Patient's Chart, lab work & pertinent test results  Airway Mallampati: III  TM Distance: >3 FB Neck ROM: Full    Dental  (+) Teeth Intact, Dental Advisory Given   Pulmonary asthma , sleep apnea and Continuous Positive Airway Pressure Ventilation ,    Pulmonary exam normal breath sounds clear to auscultation       Cardiovascular hypertension, Pt. on medications (-) angina(-) Past MI Normal cardiovascular exam+ dysrhythmias Atrial Fibrillation  Rhythm:Regular Rate:Normal     Neuro/Psych  Headaches, PSYCHIATRIC DISORDERS Anxiety Depression Bipolar Disorder HERNIATED LUMBAR DISC WITHOUT MYELOPATHY    GI/Hepatic Neg liver ROS, GERD  Medicated,  Endo/Other  diabetes, Type 2, Oral Hypoglycemic Agents  Renal/GU negative Renal ROS     Musculoskeletal  (+) Arthritis ,   Abdominal   Peds  Hematology  (+) Blood dyscrasia (Eliquis), ,   Anesthesia Other Findings Day of surgery medications reviewed with the patient.  Reproductive/Obstetrics                           Anesthesia Physical Anesthesia Plan  ASA: 3  Anesthesia Plan: General   Post-op Pain Management: Tylenol PO (pre-op)   Induction: Intravenous  PONV Risk Score and Plan: 3 and Dexamethasone and Ondansetron  Airway Management Planned: Oral ETT  Additional Equipment:   Intra-op Plan:   Post-operative Plan: Extubation in OR  Informed Consent: I have reviewed the patients History and Physical, chart, labs and discussed the procedure including the risks, benefits and alternatives for the proposed anesthesia with the patient or authorized representative who has indicated his/her understanding and acceptance.     Dental advisory given  Plan Discussed with: CRNA  Anesthesia Plan Comments: (PAT note  by Karoline Caldwell, PA-C: Follows with cardiology for history of HLD, HTN, PE, paroxysmal atrial fibrillation on Eliquis.  Coronary CT 2020 showed calcium score of 0.  Last seen by Dr. Ellyn Hack 09/29/2021 and at that time noted to be maintaining sinus rhythm with very fleeting episodes of A. fib.  Cardiac clearance per telephone encounter 12/13/2021, "Chart reviewed as part of pre-operative protocol coverage. Given past medical history and time since last visit, based on ACC/AHA guidelines,Nohemy M Jacksonwould be at acceptable risk for the planned procedure without further cardiovascular testing.Patient with diagnosis ofafibon Eliquisfor anticoagulation.Marland KitchenMarland KitchenPer office protocol, patient can holdEliquisfor 3days prior to procedure.Patient was advised that if she develops new symptoms prior to surgery to contact our office to arrange a follow-up appointment.She verbalized understanding."  Follows with allergy and asthma for history of moderate persistent asthma, allergic rhinitis with conjunctivitis, not allergy, atopic dermatitis.  Last seen 12/17/2021 and doing well at that time.  She is maintained on Symbicort, Singulair, as needed albuterol, and monthly Nucala injections.  OSA on CPAP.  DM2, well controlled, A1c 6.6 on preop labs.  Preop labs reviewed, unremarkable.  EKG 09/29/2021: NSR. Rate 82. Possible LAE. Nonspecific T wave abnormality.   Cardiac event monitor 02/24/2021: Mostly sinus rhythm, minimum heart rate 68 bpm, maximum 127 bpm.. Average heart rate 88 bpm. Rare PACs and PVCs (<1%). There is one brief spell that appears to possibly be atrial fibrillation but only 4-1/2 minutes. Rate ranges from 91 to 124 bpm from 10:56 PM to 10:59 PM. Symptoms not noted.  [facility], in [place], [place]): 1. LV systolic function normal with  visual assessment. EF estimated to be 60%. No obvious wall motion abnormalities identified in the views obtained. 2. RV  systolic pressure mildly increased. Estimated peak pressure was 45 mmHg.  Nuclear stress test 03/23/16([facility], in [place], [place]): 1.Negative pharmacologic stress test from an electrocardiographic standpoint 2. Most likely normal perfusion study. Next line 3. Perfusion imaging shows no evidence of significant ongoing ischemia or prior infarction. There is apical wall thinning noted. This is likely consistent with tissue attenuation. 4. Gated SPECT imaging shows normal LV chamber size and function with an estimated EF 67%. No obvious regional wall motion abnormalities noted. Of note, there is no significant apical wall motion abnormality. 5. In comparison to prior study of 12/2008, no significant changes are noted. 6. This is low risk finding. )       Anesthesia Quick Evaluation

## 2021-12-24 NOTE — Progress Notes (Signed)
Anesthesia Chart Review:  Follows with cardiology for history of HLD, HTN, PE, paroxysmal atrial fibrillation on Eliquis.  Coronary CT 2020 showed calcium score of 0.  Last seen by Dr. Herbie Baltimore 09/29/2021 and at that time noted to be maintaining sinus rhythm with very fleeting episodes of A. fib.  Cardiac clearance per telephone encounter 12/13/2021, "Chart reviewed as part of pre-operative protocol coverage. Given past medical history and time since last visit, based on ACC/AHA guidelines, ZYARA RILING would be at acceptable risk for the planned procedure without further cardiovascular testing. Patient with diagnosis of afib on Eliquis for anticoagulation.Marland KitchenMarland KitchenPer office protocol, patient can hold Eliquis for 3 days prior to procedure. Patient was advised that if she develops new symptoms prior to surgery to contact our office to arrange a follow-up appointment.  She verbalized understanding."  Follows with allergy and asthma for history of moderate persistent asthma, allergic rhinitis with conjunctivitis, not allergy, atopic dermatitis.  Last seen 12/17/2021 and doing well at that time.  She is maintained on Symbicort, Singulair, as needed albuterol, and monthly Nucala injections.  OSA on CPAP.  DM2, well controlled, A1c 6.6 on preop labs.  Preop labs reviewed, unremarkable.  EKG 09/29/2021: NSR. Rate 82. Possible LAE. Nonspecific T wave abnormality.   Cardiac event monitor 02/24/2021: Mostly sinus rhythm, minimum heart rate 68 bpm, maximum 127 bpm.. Average heart rate 88 bpm. Rare PACs and PVCs (<1%). There is one brief spell that appears to possibly be atrial fibrillation but only 4-1/2 minutes. Rate ranges from 91 to 124 bpm from 10:56 PM to 10:59 PM. Symptoms not noted.  Echo 07/05/16  ([facility], in [place], [place]): 1. LV systolic function normal with visual assessment. EF estimated to be 60%. No obvious wall motion abnormalities identified in the views obtained. 2.  RV systolic pressure mildly increased. Estimated peak pressure was 45 mmHg.   Nuclear stress test 03/23/16 ([facility], in [place], [place]): 1. Negative pharmacologic stress test from an electrocardiographic standpoint 2. Most likely normal perfusion study. Next line 3. Perfusion imaging shows no evidence of significant ongoing ischemia or prior infarction. There is apical wall thinning noted. This is likely consistent with tissue attenuation. 4. Gated SPECT imaging shows normal LV chamber size and function with an estimated EF 67%. No obvious regional wall motion abnormalities noted. Of note, there is no significant apical wall motion abnormality. 5. In comparison to prior study of 12/2008, no significant changes are noted. 6. This is low risk finding.    Zannie Cove [facility]/Anesthesiology Phone [phone] 12/24/2021 10:25 AM'

## 2021-12-30 ENCOUNTER — Other Ambulatory Visit (HOSPITAL_COMMUNITY)
Admission: RE | Admit: 2021-12-30 | Discharge: 2021-12-30 | Disposition: A | Discharge status: Home / Self Care | Payer: 59 | Source: Ambulatory Visit | Attending: Neurosurgery | Admitting: Neurosurgery

## 2021-12-30 DIAGNOSIS — Z01812 Encounter for preprocedural laboratory examination: Secondary | ICD-10-CM | POA: Insufficient documentation

## 2021-12-30 DIAGNOSIS — Z20822 Contact with and (suspected) exposure to covid-19: Secondary | ICD-10-CM | POA: Insufficient documentation

## 2021-12-30 DIAGNOSIS — Z01818 Encounter for other preprocedural examination: Secondary | ICD-10-CM

## 2021-12-30 LAB — SARS CORONAVIRUS 2 (TAT 6-24 HRS): SARS Coronavirus 2: NEGATIVE

## 2021-12-31 ENCOUNTER — Ambulatory Visit (INDEPENDENT_AMBULATORY_CARE_PROVIDER_SITE_OTHER): Payer: 59 | Admitting: Psychiatry

## 2021-12-31 DIAGNOSIS — F419 Anxiety disorder, unspecified: Secondary | ICD-10-CM

## 2021-12-31 NOTE — Progress Notes (Signed)
[facility] Counselor/Therapist Progress Note  Patient ID: Chelsea Benson, MRN: [id number],    Date: 12/31/2021  Time Spent: 48 minutes   Virtual Visit via Telehealth Note: Telephone only, Pt not able to do video today Connected with patient by a telemedicine/telehealth application, with their informed consent, and verified patient privacy and that I am speaking with the correct person using two identifiers. I discussed the limitations, risks, security and privacy concerns of performing psychotherapy and the availability of in person appointments. I also discussed with the patient that there may be a patient responsible charge related to this service. The patient expressed understanding and agreed to proceed. I discussed the treatment planning with the patient. The patient was provided an opportunity to ask questions and all were answered. The patient agreed with the plan and demonstrated an understanding of the instructions. The patient was advised to call  our office if  symptoms worsen or feel they are in a crisis state and need immediate contact.   Therapist Location: [facility] Patient Location: home   Treatment Type: Individual Therapy  Reported Symptoms: anxiety (general and in reference to her upcoming surgery next week)  Mental Status Exam:  Appearance:   N/a   telehealth      Behavior:  Appropriate, Sharing, and Motivated  Motor:  Using rollater in walking  Speech/Language:   Clear and Coherent  Affect:  N/a  telehealth  Mood:  anxious  Thought process:  goal directed  Thought content:    Rumination and some obsessiveness  Sensory/Perceptual disturbances:    WNL  Orientation:  oriented to person, place, time/date, situation, day of week, month of year, year, and stated date of Jan. 27, 2023  Attention:  Good  Concentration:  Good and Fair  Memory:  WNL  Fund of knowledge:   Good  Insight:    Good and Fair  Judgment:   Good  Impulse Control:  Good    Risk Assessment: Danger to Self:  No Self-injurious Behavior: No Danger to Others: No Duty to Warn:no Physical Aggression / Violence:No  Access to Firearms a concern: No  Gang Involvement:No   Subjective: Patient today reporting anxiety regarding her upcoming back surgery this coming Monday, and also regarding additional stress and losses within her extended family.  Processed her concerns and feelings re: her back surgery and trying to look for the positives and hopefully will be having less pain going forward.  Also shared more and discussed 2 more family losses within past week. Did state she was limiting more of her watching "so much bad news on TV" which is a good decision and one that I've encouraged her previously to consider as it adds to her stress and worrying.  Is progressing and not making as many negative assumptions about events in her life and trying to decrease her thinking that "things are going to go wrong".  Encouraging more hopefulness and less over-worrying.  Still trying to have healthier boundaries with some family members and friends.  Difficulty with things "out of my control".  Encouraged patient in following through with recommendations by her surgeon and we plan a MyChart video session after she gets home from the hospital.  Interventions: Solution-Oriented/Positive Psychology and Ego-Supportive  Treatment Goals:   Goals remain on tx plan as patient works on strategies to meet her goals.  Progress will be documented each visit in "Progress" section on Plan.   Long term goal: (measurable) Reduce overall level, frequency, and intensity  of the anxiety so that daily functioning is not impaired.  Patient will eventually report a rating of "4" or less" on 1-10 anxiety scale for a 55-month time period where her daily functioning is not impaired.  Short term goal: Increase understanding of beliefs and messages that produce the worry and anxiety. Strategy: Patient will  explore and work to stop cognitive messages that feed anxiety and work to replace them with more positive and empowering messages  Diagnosis:   ICD-10-CM   1. Anxiety disorder, unspecified type  F41.9      Plan:  Patient today with some anxiety but also showing good motivation considering the stress of her upcoming surgery this coming week.  Talked through her anxious/fearful thoughts and did not get stuck in them, and instead, able to talk more in terms of hoping for the best and how she has a lot of support lined up between now in surgery and after surgery which is a comfort for her.  Encouraged patient in her practice of more positive behaviors including: Trusting the support and care of others, reflecting on her progress often, for every negative thought create to positives, believing herself more and recognize her strengths more than limitations, refraining from jumping to conclusions and assuming worst case scenarios, staying in contact with people that are supportive, having frequent contact with family members that are healthy for her and set limits with others as needed, maintain contact with a close neighbor friend who is helpful to her, get outside some each day when possible as she always enjoys being on her porch, interrupt anxious/negative thoughts and replace with more realistic and empowering thoughts, allow for good sleep patterns, healthy nutrition, remain on her medications as prescribed, stay in the present focusing on what she can change, use more positive/encouraging self talk, and feel good about the strength she shows working with goal-directed behaviors to move in a direction that supports improved emotional health.  Goal review and progress/challenges noted with patient.  Next appointment (after her surgery) within 2 to 3 weeks.  This record has been created using [employer].  Chart creation errors have been sought, but may not always have been located and corrected.  Such  creation errors do not reflect on the standard of medical care provided.   Mathis Fare, LCSW

## 2022-01-03 ENCOUNTER — Inpatient Hospital Stay (HOSPITAL_COMMUNITY): Payer: 59 | Admitting: Anesthesiology

## 2022-01-03 ENCOUNTER — Encounter (HOSPITAL_COMMUNITY): Payer: Self-pay | Admitting: Neurosurgery

## 2022-01-03 ENCOUNTER — Other Ambulatory Visit: Payer: Self-pay

## 2022-01-03 ENCOUNTER — Inpatient Hospital Stay (HOSPITAL_COMMUNITY): Payer: 59 | Admitting: Physician Assistant

## 2022-01-03 ENCOUNTER — Inpatient Hospital Stay (HOSPITAL_COMMUNITY): Payer: 59

## 2022-01-03 ENCOUNTER — Inpatient Hospital Stay (HOSPITAL_COMMUNITY)
Admission: RE | Admit: 2022-01-03 | Discharge: 2022-01-05 | DRG: 460 | Disposition: A | Discharge status: Home Health | Payer: 59 | Source: Ambulatory Visit | Attending: Neurosurgery | Admitting: Neurosurgery

## 2022-01-03 ENCOUNTER — Encounter (HOSPITAL_COMMUNITY)
Admission: RE | Disposition: A | Discharge status: Home Health | Payer: Self-pay | Source: Ambulatory Visit | Attending: Neurosurgery

## 2022-01-03 DIAGNOSIS — F419 Anxiety disorder, unspecified: Secondary | ICD-10-CM | POA: Diagnosis present

## 2022-01-03 DIAGNOSIS — J45909 Unspecified asthma, uncomplicated: Secondary | ICD-10-CM | POA: Diagnosis present

## 2022-01-03 DIAGNOSIS — Z96651 Presence of right artificial knee joint: Secondary | ICD-10-CM | POA: Diagnosis present

## 2022-01-03 DIAGNOSIS — G96198 Other disorders of meninges, not elsewhere classified: Secondary | ICD-10-CM | POA: Diagnosis present

## 2022-01-03 DIAGNOSIS — Z7984 Long term (current) use of oral hypoglycemic drugs: Secondary | ICD-10-CM

## 2022-01-03 DIAGNOSIS — G9600 Cerebrospinal fluid leak, unspecified: Secondary | ICD-10-CM | POA: Diagnosis not present

## 2022-01-03 DIAGNOSIS — Z7951 Long term (current) use of inhaled steroids: Secondary | ICD-10-CM

## 2022-01-03 DIAGNOSIS — Z833 Family history of diabetes mellitus: Secondary | ICD-10-CM

## 2022-01-03 DIAGNOSIS — R252 Cramp and spasm: Secondary | ICD-10-CM

## 2022-01-03 DIAGNOSIS — I10 Essential (primary) hypertension: Secondary | ICD-10-CM | POA: Diagnosis present

## 2022-01-03 DIAGNOSIS — Z981 Arthrodesis status: Secondary | ICD-10-CM | POA: Diagnosis not present

## 2022-01-03 DIAGNOSIS — Z8249 Family history of ischemic heart disease and other diseases of the circulatory system: Secondary | ICD-10-CM | POA: Diagnosis not present

## 2022-01-03 DIAGNOSIS — F319 Bipolar disorder, unspecified: Secondary | ICD-10-CM | POA: Diagnosis present

## 2022-01-03 DIAGNOSIS — G4733 Obstructive sleep apnea (adult) (pediatric): Secondary | ICD-10-CM | POA: Diagnosis present

## 2022-01-03 DIAGNOSIS — M961 Postlaminectomy syndrome, not elsewhere classified: Secondary | ICD-10-CM

## 2022-01-03 DIAGNOSIS — Z7901 Long term (current) use of anticoagulants: Secondary | ICD-10-CM

## 2022-01-03 DIAGNOSIS — Z888 Allergy status to other drugs, medicaments and biological substances status: Secondary | ICD-10-CM

## 2022-01-03 DIAGNOSIS — Z79899 Other long term (current) drug therapy: Secondary | ICD-10-CM

## 2022-01-03 DIAGNOSIS — E785 Hyperlipidemia, unspecified: Secondary | ICD-10-CM | POA: Diagnosis present

## 2022-01-03 DIAGNOSIS — Z91018 Allergy to other foods: Secondary | ICD-10-CM | POA: Diagnosis not present

## 2022-01-03 DIAGNOSIS — M5126 Other intervertebral disc displacement, lumbar region: Secondary | ICD-10-CM | POA: Diagnosis present

## 2022-01-03 DIAGNOSIS — E119 Type 2 diabetes mellitus without complications: Secondary | ICD-10-CM | POA: Diagnosis present

## 2022-01-03 DIAGNOSIS — Z419 Encounter for procedure for purposes other than remedying health state, unspecified: Secondary | ICD-10-CM

## 2022-01-03 DIAGNOSIS — I480 Paroxysmal atrial fibrillation: Secondary | ICD-10-CM | POA: Diagnosis present

## 2022-01-03 DIAGNOSIS — M5137 Other intervertebral disc degeneration, lumbosacral region: Secondary | ICD-10-CM

## 2022-01-03 DIAGNOSIS — Z91013 Allergy to seafood: Secondary | ICD-10-CM | POA: Diagnosis not present

## 2022-01-03 DIAGNOSIS — M48061 Spinal stenosis, lumbar region without neurogenic claudication: Secondary | ICD-10-CM | POA: Diagnosis present

## 2022-01-03 DIAGNOSIS — F3181 Bipolar II disorder: Secondary | ICD-10-CM

## 2022-01-03 DIAGNOSIS — Z818 Family history of other mental and behavioral disorders: Secondary | ICD-10-CM

## 2022-01-03 DIAGNOSIS — G8929 Other chronic pain: Secondary | ICD-10-CM | POA: Diagnosis present

## 2022-01-03 DIAGNOSIS — Z885 Allergy status to narcotic agent status: Secondary | ICD-10-CM

## 2022-01-03 DIAGNOSIS — Z825 Family history of asthma and other chronic lower respiratory diseases: Secondary | ICD-10-CM | POA: Diagnosis not present

## 2022-01-03 DIAGNOSIS — M1990 Unspecified osteoarthritis, unspecified site: Secondary | ICD-10-CM

## 2022-01-03 DIAGNOSIS — Z791 Long term (current) use of non-steroidal anti-inflammatories (NSAID): Secondary | ICD-10-CM

## 2022-01-03 DIAGNOSIS — Z86711 Personal history of pulmonary embolism: Secondary | ICD-10-CM | POA: Diagnosis not present

## 2022-01-03 DIAGNOSIS — G9741 Accidental puncture or laceration of dura during a procedure: Secondary | ICD-10-CM | POA: Diagnosis not present

## 2022-01-03 LAB — GLUCOSE, CAPILLARY
Glucose-Capillary: 109 mg/dL — ABNORMAL HIGH (ref 70–99)
Glucose-Capillary: 117 mg/dL — ABNORMAL HIGH (ref 70–99)
Glucose-Capillary: 133 mg/dL — ABNORMAL HIGH (ref 70–99)
Glucose-Capillary: 129 mg/dL — ABNORMAL HIGH (ref 70–99)

## 2022-01-03 SURGERY — POSTERIOR LUMBAR FUSION 1 WITH HARDWARE REMOVAL
Anesthesia: General | Site: Spine Lumbar

## 2022-01-03 MED ORDER — MOMETASONE FURO-FORMOTEROL FUM 200-5 MCG/ACT IN AERO
2.0000 | INHALATION_SPRAY | Freq: Two times a day (BID) | RESPIRATORY_TRACT | Status: DC
  Administered 2022-01-03 – 2022-01-04 (×3): 2 via RESPIRATORY_TRACT
  Filled 2022-01-03: qty 8.8

## 2022-01-03 MED ORDER — SODIUM CHLORIDE 0.9 % IV SOLN
250.0000 mL | INTRAVENOUS | Status: DC
  Administered 2022-01-03: 250 mL via INTRAVENOUS

## 2022-01-03 MED ORDER — ALBUTEROL SULFATE HFA 108 (90 BASE) MCG/ACT IN AERS: 2.0000 | INHALATION_SPRAY | RESPIRATORY_TRACT | Status: DC | PRN

## 2022-01-03 MED ORDER — CYCLOBENZAPRINE HCL 10 MG PO TABS: 10.0000 mg | ORAL_TABLET | Freq: Three times a day (TID) | ORAL | Status: DC | PRN

## 2022-01-03 MED ORDER — FENTANYL CITRATE (PF) 100 MCG/2ML IJ SOLN
25.0000 ug | INTRAMUSCULAR | Status: DC | PRN
  Administered 2022-01-03: 25 ug via INTRAVENOUS
  Administered 2022-01-03: 50 ug via INTRAVENOUS

## 2022-01-03 MED ORDER — ACETAMINOPHEN 325 MG PO TABS
650.0000 mg | ORAL_TABLET | ORAL | Status: DC | PRN
  Administered 2022-01-04: 650 mg via ORAL
  Filled 2022-01-03: qty 2

## 2022-01-03 MED ORDER — METFORMIN HCL ER 500 MG PO TB24
500.0000 mg | ORAL_TABLET | Freq: Two times a day (BID) | ORAL | Status: DC
  Administered 2022-01-03 – 2022-01-05 (×4): 500 mg via ORAL
  Filled 2022-01-03 (×4): qty 1

## 2022-01-03 MED ORDER — MONTELUKAST SODIUM 10 MG PO TABS
10.0000 mg | ORAL_TABLET | Freq: Every day | ORAL | Status: DC
  Administered 2022-01-03 – 2022-01-04 (×2): 10 mg via ORAL
  Filled 2022-01-03 (×2): qty 1

## 2022-01-03 MED ORDER — HYDROCORTISONE 1 % EX CREA: TOPICAL_CREAM | Freq: Two times a day (BID) | CUTANEOUS | Status: DC

## 2022-01-03 MED ORDER — ROCURONIUM BROMIDE 10 MG/ML (PF) SYRINGE
PREFILLED_SYRINGE | INTRAVENOUS | Status: AC
  Filled 2022-01-03: qty 10

## 2022-01-03 MED ORDER — ACETAMINOPHEN 650 MG RE SUPP: 650.0000 mg | RECTAL | Status: DC | PRN

## 2022-01-03 MED ORDER — DEXAMETHASONE SODIUM PHOSPHATE 10 MG/ML IJ SOLN
INTRAMUSCULAR | Status: AC
  Filled 2022-01-03: qty 1

## 2022-01-03 MED ORDER — LIDOCAINE 2% (20 MG/ML) 5 ML SYRINGE
INTRAMUSCULAR | Status: DC | PRN
Start: 2022-01-03 — End: 2022-01-03
  Administered 2022-01-03: 80 mg via INTRAVENOUS

## 2022-01-03 MED ORDER — HYDROCODONE-ACETAMINOPHEN 5-325 MG PO TABS
2.0000 | ORAL_TABLET | ORAL | Status: DC | PRN
  Administered 2022-01-04: 2 via ORAL
  Filled 2022-01-03: qty 2

## 2022-01-03 MED ORDER — PROPOFOL 10 MG/ML IV BOLUS
INTRAVENOUS | Status: AC
  Filled 2022-01-03: qty 20

## 2022-01-03 MED ORDER — ORAL CARE MOUTH RINSE: 15.0000 mL | Freq: Once | OROMUCOSAL | Status: AC

## 2022-01-03 MED ORDER — IPRATROPIUM BROMIDE 0.06 % NA SOLN: 2.0000 | Freq: Four times a day (QID) | NASAL | Status: DC | PRN

## 2022-01-03 MED ORDER — FLUTICASONE PROPIONATE 50 MCG/ACT NA SUSP
2.0000 | Freq: Every day | NASAL | Status: DC
  Administered 2022-01-04: 2 via NASAL
  Filled 2022-01-03: qty 16

## 2022-01-03 MED ORDER — HYDROMORPHONE HCL 1 MG/ML IJ SOLN
INTRAMUSCULAR | Status: AC
  Filled 2022-01-03: qty 0.5

## 2022-01-03 MED ORDER — ALUM & MAG HYDROXIDE-SIMETH 200-200-20 MG/5ML PO SUSP: 30.0000 mL | Freq: Four times a day (QID) | ORAL | Status: DC | PRN

## 2022-01-03 MED ORDER — THROMBIN 20000 UNITS EX SOLR
CUTANEOUS | Status: AC
  Filled 2022-01-03: qty 20000

## 2022-01-03 MED ORDER — LACTATED RINGERS IV SOLN: INTRAVENOUS | Status: DC

## 2022-01-03 MED ORDER — CEFAZOLIN SODIUM-DEXTROSE 2-4 GM/100ML-% IV SOLN
2.0000 g | Freq: Three times a day (TID) | INTRAVENOUS | Status: AC
  Administered 2022-01-03 – 2022-01-04 (×2): 2 g via INTRAVENOUS
  Filled 2022-01-03 (×2): qty 100

## 2022-01-03 MED ORDER — TRIAMCINOLONE ACETONIDE 0.1 % EX CREA: TOPICAL_CREAM | Freq: Two times a day (BID) | CUTANEOUS | Status: DC

## 2022-01-03 MED ORDER — FENTANYL CITRATE (PF) 250 MCG/5ML IJ SOLN
INTRAMUSCULAR | Status: AC
  Filled 2022-01-03: qty 5

## 2022-01-03 MED ORDER — MENTHOL 3 MG MT LOZG: 1.0000 | LOZENGE | OROMUCOSAL | Status: DC | PRN

## 2022-01-03 MED ORDER — ACETAMINOPHEN 500 MG PO TABS
1000.0000 mg | ORAL_TABLET | Freq: Once | ORAL | Status: AC
  Administered 2022-01-03: 1000 mg via ORAL
  Filled 2022-01-03: qty 2

## 2022-01-03 MED ORDER — PHENOL 1.4 % MT LIQD: 1.0000 | OROMUCOSAL | Status: DC | PRN

## 2022-01-03 MED ORDER — LEMBOREXANT 10 MG PO TABS: 1.0000 | ORAL_TABLET | Freq: Every day | ORAL | Status: DC

## 2022-01-03 MED ORDER — ZOLPIDEM TARTRATE 5 MG PO TABS: 5.0000 mg | ORAL_TABLET | Freq: Every evening | ORAL | Status: DC | PRN

## 2022-01-03 MED ORDER — PREGABALIN 100 MG PO CAPS
100.0000 mg | ORAL_CAPSULE | Freq: Three times a day (TID) | ORAL | Status: DC
  Administered 2022-01-03 – 2022-01-04 (×5): 100 mg via ORAL
  Filled 2022-01-03 (×5): qty 1

## 2022-01-03 MED ORDER — PANTOPRAZOLE SODIUM 40 MG PO TBEC
40.0000 mg | DELAYED_RELEASE_TABLET | Freq: Every day | ORAL | Status: DC
  Administered 2022-01-04: 40 mg via ORAL
  Filled 2022-01-03: qty 1

## 2022-01-03 MED ORDER — PHENYLEPHRINE 40 MCG/ML (10ML) SYRINGE FOR IV PUSH (FOR BLOOD PRESSURE SUPPORT)
PREFILLED_SYRINGE | INTRAVENOUS | Status: DC | PRN
Start: 2022-01-03 — End: 2022-01-03
  Administered 2022-01-03 (×2): 80 ug via INTRAVENOUS
  Administered 2022-01-03: 40 ug via INTRAVENOUS
  Administered 2022-01-03: 80 ug via INTRAVENOUS

## 2022-01-03 MED ORDER — OXCARBAZEPINE 300 MG PO TABS
600.0000 mg | ORAL_TABLET | Freq: Every day | ORAL | Status: DC
  Administered 2022-01-03 – 2022-01-04 (×2): 600 mg via ORAL
  Filled 2022-01-03 (×2): qty 2

## 2022-01-03 MED ORDER — SODIUM CHLORIDE 0.9% FLUSH: 3.0000 mL | Freq: Two times a day (BID) | INTRAVENOUS | Status: DC

## 2022-01-03 MED ORDER — ONDANSETRON HCL 4 MG/2ML IJ SOLN
INTRAMUSCULAR | Status: DC | PRN
Start: 2022-01-03 — End: 2022-01-03
  Administered 2022-01-03: 4 mg via INTRAVENOUS

## 2022-01-03 MED ORDER — LIDOCAINE-EPINEPHRINE 1 %-1:100000 IJ SOLN
INTRAMUSCULAR | Status: DC | PRN
  Administered 2022-01-03: 10 mL

## 2022-01-03 MED ORDER — POLYETHYL GLYCOL-PROPYL GLYCOL 0.4-0.3 % OP GEL: Freq: Two times a day (BID) | OPHTHALMIC | Status: DC | PRN

## 2022-01-03 MED ORDER — SUGAMMADEX SODIUM 200 MG/2ML IV SOLN
INTRAVENOUS | Status: DC | PRN
Start: 2022-01-03 — End: 2022-01-03
  Administered 2022-01-03: 100 mg via INTRAVENOUS
  Administered 2022-01-03: 200 mg via INTRAVENOUS

## 2022-01-03 MED ORDER — CHLORHEXIDINE GLUCONATE CLOTH 2 % EX PADS: 6.0000 | MEDICATED_PAD | Freq: Once | CUTANEOUS | Status: DC

## 2022-01-03 MED ORDER — ONDANSETRON HCL 4 MG/2ML IJ SOLN
INTRAMUSCULAR | Status: AC
  Filled 2022-01-03: qty 2

## 2022-01-03 MED ORDER — ROCURONIUM BROMIDE 10 MG/ML (PF) SYRINGE
PREFILLED_SYRINGE | INTRAVENOUS | Status: DC | PRN
  Administered 2022-01-03: 50 mg via INTRAVENOUS
  Administered 2022-01-03: 30 mg via INTRAVENOUS
  Administered 2022-01-03: 20 mg via INTRAVENOUS
  Administered 2022-01-03: 30 mg via INTRAVENOUS

## 2022-01-03 MED ORDER — HYDROCHLOROTHIAZIDE 25 MG PO TABS
25.0000 mg | ORAL_TABLET | Freq: Every day | ORAL | Status: DC
  Administered 2022-01-03 – 2022-01-04 (×2): 25 mg via ORAL
  Filled 2022-01-03 (×2): qty 1

## 2022-01-03 MED ORDER — POTASSIUM CHLORIDE CRYS ER 20 MEQ PO TBCR
20.0000 meq | EXTENDED_RELEASE_TABLET | Freq: Every day | ORAL | Status: DC
  Administered 2022-01-03 – 2022-01-04 (×2): 20 meq via ORAL
  Filled 2022-01-03 (×2): qty 1

## 2022-01-03 MED ORDER — ARIPIPRAZOLE 2 MG PO TABS
4.0000 mg | ORAL_TABLET | Freq: Every day | ORAL | Status: DC
  Administered 2022-01-04: 4 mg via ORAL
  Filled 2022-01-03 (×2): qty 2

## 2022-01-03 MED ORDER — DILTIAZEM HCL ER COATED BEADS 300 MG PO CP24
300.0000 mg | ORAL_CAPSULE | Freq: Every day | ORAL | Status: DC
Start: 2022-01-04 — End: 2022-01-05
  Administered 2022-01-04: 300 mg via ORAL
  Filled 2022-01-03 (×2): qty 1

## 2022-01-03 MED ORDER — DILTIAZEM HCL 60 MG PO TABS
60.0000 mg | ORAL_TABLET | Freq: Two times a day (BID) | ORAL | Status: DC | PRN
  Filled 2022-01-03: qty 1

## 2022-01-03 MED ORDER — OYSTER SHELL CALCIUM/D3 500-5 MG-MCG PO TABS
1.0000 | ORAL_TABLET | Freq: Every day | ORAL | Status: DC
  Administered 2022-01-04 – 2022-01-05 (×2): 1 via ORAL
  Filled 2022-01-03 (×2): qty 1

## 2022-01-03 MED ORDER — PHENYLEPHRINE 40 MCG/ML (10ML) SYRINGE FOR IV PUSH (FOR BLOOD PRESSURE SUPPORT)
PREFILLED_SYRINGE | INTRAVENOUS | Status: AC
  Filled 2022-01-03: qty 10

## 2022-01-03 MED ORDER — HYDROMORPHONE HCL 1 MG/ML IJ SOLN
0.5000 mg | INTRAMUSCULAR | Status: DC | PRN
  Administered 2022-01-03: 0.5 mg via INTRAVENOUS
  Filled 2022-01-03: qty 0.5

## 2022-01-03 MED ORDER — LIDOCAINE 2% (20 MG/ML) 5 ML SYRINGE
INTRAMUSCULAR | Status: AC
  Filled 2022-01-03: qty 5

## 2022-01-03 MED ORDER — BUPIVACAINE LIPOSOME 1.3 % IJ SUSP
INTRAMUSCULAR | Status: AC
  Filled 2022-01-03: qty 20

## 2022-01-03 MED ORDER — ONDANSETRON HCL 4 MG/2ML IJ SOLN: 4.0000 mg | Freq: Four times a day (QID) | INTRAMUSCULAR | Status: DC | PRN

## 2022-01-03 MED ORDER — PANTOPRAZOLE SODIUM 40 MG IV SOLR: 40.0000 mg | Freq: Every day | INTRAVENOUS | Status: DC

## 2022-01-03 MED ORDER — SODIUM CHLORIDE 0.9% FLUSH: 3.0000 mL | INTRAVENOUS | Status: DC | PRN

## 2022-01-03 MED ORDER — DEXAMETHASONE SODIUM PHOSPHATE 10 MG/ML IJ SOLN
INTRAMUSCULAR | Status: DC | PRN
  Administered 2022-01-03: 10 mg via INTRAVENOUS

## 2022-01-03 MED ORDER — MIDAZOLAM HCL 2 MG/2ML IJ SOLN
INTRAMUSCULAR | Status: DC | PRN
  Administered 2022-01-03: 1 mg via INTRAVENOUS

## 2022-01-03 MED ORDER — PRAVASTATIN SODIUM 10 MG PO TABS
20.0000 mg | ORAL_TABLET | Freq: Every day | ORAL | Status: DC
  Administered 2022-01-03 – 2022-01-04 (×2): 20 mg via ORAL
  Filled 2022-01-03 (×2): qty 2

## 2022-01-03 MED ORDER — PHENYLEPHRINE HCL-NACL 20-0.9 MG/250ML-% IV SOLN
INTRAVENOUS | Status: DC | PRN
Start: 2022-01-03 — End: 2022-01-03
  Administered 2022-01-03: 40 ug/min via INTRAVENOUS

## 2022-01-03 MED ORDER — MIDAZOLAM HCL 2 MG/2ML IJ SOLN
INTRAMUSCULAR | Status: AC
  Filled 2022-01-03: qty 2

## 2022-01-03 MED ORDER — ONDANSETRON HCL 4 MG PO TABS: 4.0000 mg | ORAL_TABLET | Freq: Four times a day (QID) | ORAL | Status: DC | PRN

## 2022-01-03 MED ORDER — EPINEPHRINE 0.3 MG/0.3ML IJ SOAJ: 0.3000 mg | Freq: Once | INTRAMUSCULAR | Status: DC | PRN

## 2022-01-03 MED ORDER — FAMOTIDINE 20 MG PO TABS
20.0000 mg | ORAL_TABLET | Freq: Every evening | ORAL | Status: DC
  Administered 2022-01-03 – 2022-01-04 (×2): 20 mg via ORAL
  Filled 2022-01-03 (×2): qty 1

## 2022-01-03 MED ORDER — INSULIN ASPART 100 UNIT/ML IJ SOLN
0.0000 [IU] | Freq: Three times a day (TID) | INTRAMUSCULAR | Status: DC
  Administered 2022-01-04 (×2): 2 [IU] via SUBCUTANEOUS

## 2022-01-03 MED ORDER — CLOBETASOL PROP EMOLLIENT BASE 0.05 % EX CREA: 1.0000 "application " | TOPICAL_CREAM | Freq: Two times a day (BID) | CUTANEOUS | Status: DC | PRN

## 2022-01-03 MED ORDER — BUPIVACAINE LIPOSOME 1.3 % IJ SUSP
INTRAMUSCULAR | Status: DC | PRN
  Administered 2022-01-03: 20 mL

## 2022-01-03 MED ORDER — POLYMYXIN B-TRIMETHOPRIM 10000-0.1 UNIT/ML-% OP SOLN: 1.0000 [drp] | OPHTHALMIC | Status: DC

## 2022-01-03 MED ORDER — SEMAGLUTIDE (1 MG/DOSE) 2 MG/1.5ML ~~LOC~~ SOPN: 1.0000 mg | PEN_INJECTOR | SUBCUTANEOUS | Status: DC

## 2022-01-03 MED ORDER — CHLORHEXIDINE GLUCONATE 0.12 % MT SOLN
15.0000 mL | Freq: Once | OROMUCOSAL | Status: AC
  Administered 2022-01-03: 15 mL via OROMUCOSAL
  Filled 2022-01-03: qty 15

## 2022-01-03 MED ORDER — OLOPATADINE HCL 0.1 % OP SOLN
1.0000 [drp] | Freq: Two times a day (BID) | OPHTHALMIC | Status: DC
  Administered 2022-01-03 – 2022-01-04 (×3): 1 [drp] via OPHTHALMIC
  Filled 2022-01-03: qty 5

## 2022-01-03 MED ORDER — LIDOCAINE-EPINEPHRINE 1 %-1:100000 IJ SOLN
INTRAMUSCULAR | Status: AC
  Filled 2022-01-03: qty 1

## 2022-01-03 MED ORDER — CLONAZEPAM 0.5 MG PO TABS
0.5000 mg | ORAL_TABLET | Freq: Three times a day (TID) | ORAL | Status: DC | PRN
  Administered 2022-01-03: 0.5 mg via ORAL
  Filled 2022-01-03: qty 1

## 2022-01-03 MED ORDER — FENTANYL CITRATE (PF) 250 MCG/5ML IJ SOLN
INTRAMUSCULAR | Status: DC | PRN
Start: 2022-01-03 — End: 2022-01-03
  Administered 2022-01-03 (×5): 50 ug via INTRAVENOUS

## 2022-01-03 MED ORDER — FENTANYL CITRATE (PF) 100 MCG/2ML IJ SOLN
INTRAMUSCULAR | Status: AC
  Administered 2022-01-03: 25 ug via INTRAVENOUS
  Filled 2022-01-03: qty 2

## 2022-01-03 MED ORDER — CARBINOXAMINE MALEATE 4 MG PO TABS: 8.0000 mg | ORAL_TABLET | Freq: Two times a day (BID) | ORAL | Status: DC

## 2022-01-03 MED ORDER — 0.9 % SODIUM CHLORIDE (POUR BTL) OPTIME
TOPICAL | Status: DC | PRN
  Administered 2022-01-03: 1000 mL

## 2022-01-03 MED ORDER — CARBINOXAMINE MALEATE 4 MG PO TABS: 1.0000 | ORAL_TABLET | Freq: Two times a day (BID) | ORAL | Status: DC

## 2022-01-03 MED ORDER — HYDROMORPHONE HCL 1 MG/ML IJ SOLN
INTRAMUSCULAR | Status: DC | PRN
Start: 2022-01-03 — End: 2022-01-03
  Administered 2022-01-03: .5 mg via INTRAVENOUS

## 2022-01-03 MED ORDER — ONDANSETRON HCL 4 MG/2ML IJ SOLN: 4.0000 mg | Freq: Once | INTRAMUSCULAR | Status: DC | PRN

## 2022-01-03 MED ORDER — CEFAZOLIN SODIUM-DEXTROSE 2-4 GM/100ML-% IV SOLN
2.0000 g | INTRAVENOUS | Status: AC
  Administered 2022-01-03: 2 g via INTRAVENOUS
  Filled 2022-01-03: qty 100

## 2022-01-03 MED ORDER — OXYBUTYNIN CHLORIDE ER 10 MG PO TB24
10.0000 mg | ORAL_TABLET | Freq: Every day | ORAL | Status: DC
  Administered 2022-01-04: 10 mg via ORAL
  Filled 2022-01-03 (×2): qty 1

## 2022-01-03 MED ORDER — MIRTAZAPINE 30 MG PO TABS
30.0000 mg | ORAL_TABLET | Freq: Every day | ORAL | Status: DC
  Administered 2022-01-03 – 2022-01-04 (×2): 30 mg via ORAL
  Filled 2022-01-03 (×2): qty 1

## 2022-01-03 MED ORDER — CALCIUM-VITAMIN D 500-3.125 MG-MCG PO TABS: ORAL_TABLET | Freq: Every day | ORAL | Status: DC

## 2022-01-03 MED ORDER — PROPOFOL 10 MG/ML IV BOLUS
INTRAVENOUS | Status: DC | PRN
Start: 2022-01-03 — End: 2022-01-03
  Administered 2022-01-03: 120 mg via INTRAVENOUS

## 2022-01-03 MED ORDER — OXYCODONE-ACETAMINOPHEN 5-325 MG PO TABS
1.0000 | ORAL_TABLET | ORAL | Status: DC | PRN
  Administered 2022-01-03 – 2022-01-04 (×3): 2 via ORAL
  Administered 2022-01-04: 1 via ORAL
  Filled 2022-01-03 (×2): qty 2
  Filled 2022-01-03: qty 1
  Filled 2022-01-03: qty 2

## 2022-01-03 MED ORDER — HYDROCODONE-ACETAMINOPHEN 5-325 MG PO TABS: 2.0000 | ORAL_TABLET | ORAL | Status: DC | PRN

## 2022-01-03 MED ORDER — TIZANIDINE HCL 4 MG PO TABS
4.0000 mg | ORAL_TABLET | Freq: Four times a day (QID) | ORAL | Status: DC | PRN
  Administered 2022-01-04: 4 mg via ORAL
  Filled 2022-01-03: qty 1

## 2022-01-03 MED ORDER — THROMBIN 20000 UNITS EX SOLR: CUTANEOUS | Status: DC | PRN

## 2022-01-03 MED ORDER — ALBUTEROL SULFATE (2.5 MG/3ML) 0.083% IN NEBU: 2.5000 mg | INHALATION_SOLUTION | RESPIRATORY_TRACT | Status: DC | PRN

## 2022-01-03 SURGICAL SUPPLY — 88 items
ADH SKN CLS APL DERMABOND .7 (GAUZE/BANDAGES/DRESSINGS) ×1
APL SKNCLS STERI-STRIP NONHPOA (GAUZE/BANDAGES/DRESSINGS) ×1
BAG COUNTER SPONGE SURGICOUNT (BAG) ×3 IMPLANT
BAG SPNG CNTER NS LX DISP (BAG) ×1
BASKET BONE COLLECTION (BASKET) ×3 IMPLANT
BENZOIN TINCTURE PRP APPL 2/3 (GAUZE/BANDAGES/DRESSINGS) ×3 IMPLANT
BIT DRILL 5.0/4.0 (BIT) IMPLANT
BIT DRILL NEURO 2X3.1 SFT TUCH (MISCELLANEOUS) IMPLANT
BLADE CLIPPER SURG (BLADE) IMPLANT
BLADE SURG 11 STRL SS (BLADE) ×3 IMPLANT
BONE VIVIGEN FORMABLE 5.4CC (Bone Implant) ×2 IMPLANT
BUR CUTTER 7.0 ROUND (BURR) ×3 IMPLANT
BUR MATCHSTICK NEURO 3.0 LAGG (BURR) ×3 IMPLANT
CANISTER SUCT 3000ML PPV (MISCELLANEOUS) ×3 IMPLANT
CAP LOCKING THREADED (Cap) ×8 IMPLANT
CARTRIDGE OIL MAESTRO DRILL (MISCELLANEOUS) ×2 IMPLANT
CLSR STERI-STRIP ANTIMIC 1/2X4 (GAUZE/BANDAGES/DRESSINGS) ×1 IMPLANT
CNTNR URN SCR LID CUP LEK RST (MISCELLANEOUS) ×2 IMPLANT
CONN OPEN MOD HEAD 5.5-6.35 (Connector) ×4 IMPLANT
CONNECTOR OPEN MOD HD 5.5-6.35 (Connector) IMPLANT
CONT SPEC 4OZ STRL OR WHT (MISCELLANEOUS) ×4
COVER BACK TABLE 60X90IN (DRAPES) ×3 IMPLANT
DECANTER SPIKE VIAL GLASS SM (MISCELLANEOUS) ×2 IMPLANT
DERMABOND ADVANCED (GAUZE/BANDAGES/DRESSINGS) ×1
DERMABOND ADVANCED .7 DNX12 (GAUZE/BANDAGES/DRESSINGS) ×2 IMPLANT
DIFFUSER DRILL AIR PNEUMATIC (MISCELLANEOUS) ×3 IMPLANT
DRAPE C-ARM 42X72 X-RAY (DRAPES) ×6 IMPLANT
DRAPE C-ARMOR (DRAPES) IMPLANT
DRAPE HALF SHEET 40X57 (DRAPES) IMPLANT
DRAPE LAPAROTOMY 100X72X124 (DRAPES) ×3 IMPLANT
DRAPE SURG 17X23 STRL (DRAPES) ×3 IMPLANT
DRILL 5.0/4.0 (BIT) ×2
DRILL NEURO 2X3.1 SOFT TOUCH (MISCELLANEOUS) ×2
DRSG OPSITE 4X5.5 SM (GAUZE/BANDAGES/DRESSINGS) ×3 IMPLANT
DRSG OPSITE POSTOP 4X6 (GAUZE/BANDAGES/DRESSINGS) ×3 IMPLANT
DRSG OPSITE POSTOP 4X8 (GAUZE/BANDAGES/DRESSINGS) ×1 IMPLANT
DURAPREP 26ML APPLICATOR (WOUND CARE) ×3 IMPLANT
ELECT REM PT RETURN 9FT ADLT (ELECTROSURGICAL) ×2
ELECTRODE REM PT RTRN 9FT ADLT (ELECTROSURGICAL) ×2 IMPLANT
EVACUATOR 3/16  PVC DRAIN (DRAIN)
EVACUATOR 3/16 PVC DRAIN (DRAIN) ×2 IMPLANT
GAUZE 4X4 16PLY ~~LOC~~+RFID DBL (SPONGE) IMPLANT
GAUZE SPONGE 4X4 12PLY STRL (GAUZE/BANDAGES/DRESSINGS) ×3 IMPLANT
GLOVE EXAM NITRILE XL STR (GLOVE) IMPLANT
GLOVE SURG ENC MOIS LTX SZ7 (GLOVE) ×3 IMPLANT
GLOVE SURG ENC MOIS LTX SZ8 (GLOVE) ×7 IMPLANT
GLOVE SURG POLYISO LF SZ6.5 (GLOVE) ×3 IMPLANT
GLOVE SURG UNDER POLY LF SZ7 (GLOVE) ×4 IMPLANT
GLOVE SURG UNDER POLY LF SZ8.5 (GLOVE) ×6 IMPLANT
GOWN STRL REUS W/ TWL LRG LVL3 (GOWN DISPOSABLE) IMPLANT
GOWN STRL REUS W/ TWL XL LVL3 (GOWN DISPOSABLE) ×4 IMPLANT
GOWN STRL REUS W/TWL 2XL LVL3 (GOWN DISPOSABLE) IMPLANT
GOWN STRL REUS W/TWL LRG LVL3 (GOWN DISPOSABLE) ×4
GOWN STRL REUS W/TWL XL LVL3 (GOWN DISPOSABLE) ×4
GRAFT BNE MATRIX VG FRMBL MD 5 (Bone Implant) IMPLANT
GRAFT BONE PROTEIOS LRG 5CC (Orthopedic Implant) ×1 IMPLANT
GRAFT DURAGEN MATRIX 1WX1L (Tissue) ×1 IMPLANT
KIT BASIN OR (CUSTOM PROCEDURE TRAY) ×3 IMPLANT
KIT GRAFTMAG DEL NEURO DISP (NEUROSURGERY SUPPLIES) IMPLANT
KIT TURNOVER KIT B (KITS) ×3 IMPLANT
MILL MEDIUM DISP (BLADE) ×3 IMPLANT
NDL HYPO 21X1.5 SAFETY (NEEDLE) ×2 IMPLANT
NDL HYPO 25X1 1.5 SAFETY (NEEDLE) ×2 IMPLANT
NEEDLE HYPO 21X1.5 SAFETY (NEEDLE) ×2 IMPLANT
NEEDLE HYPO 25X1 1.5 SAFETY (NEEDLE) ×2 IMPLANT
NS IRRIG 1000ML POUR BTL (IV SOLUTION) ×3 IMPLANT
OIL CARTRIDGE MAESTRO DRILL (MISCELLANEOUS) ×2
PACK LAMINECTOMY NEURO (CUSTOM PROCEDURE TRAY) ×3 IMPLANT
PAD ARMBOARD 7.5X6 YLW CONV (MISCELLANEOUS) ×12 IMPLANT
ROD STRT CREO 5.5X60 (Rod) ×2 IMPLANT
SCREW CORTICAL CREO 5.5-4.5X30 (Screw) ×1 IMPLANT
SCREW PA THRD CREO TULIP 5.5X4 (Head) ×4 IMPLANT
SEALANT ADHERUS EXTEND TIP (MISCELLANEOUS) ×1 IMPLANT
SHAFT CREO 30MM (Neuro Prosthesis/Implant) ×1 IMPLANT
SPACER SUSTAIN RT TI 8X22X11 8 (Spacer) ×2 IMPLANT
SPONGE SURGIFOAM ABS GEL 100 (HEMOSTASIS) ×3 IMPLANT
SPONGE T-LAP 4X18 ~~LOC~~+RFID (SPONGE) IMPLANT
STRIP CLOSURE SKIN 1/2X4 (GAUZE/BANDAGES/DRESSINGS) ×5 IMPLANT
SUT VIC AB 0 CT1 18XCR BRD8 (SUTURE) ×4 IMPLANT
SUT VIC AB 0 CT1 8-18 (SUTURE) ×4
SUT VIC AB 2-0 CT1 18 (SUTURE) ×4 IMPLANT
SUT VIC AB 4-0 PS2 27 (SUTURE) ×3 IMPLANT
SYR 20ML LL LF (SYRINGE) ×1 IMPLANT
SYR 5ML LL (SYRINGE) ×1 IMPLANT
TOWEL GREEN STERILE (TOWEL DISPOSABLE) ×3 IMPLANT
TOWEL GREEN STERILE FF (TOWEL DISPOSABLE) ×3 IMPLANT
TRAY FOLEY MTR SLVR 16FR STAT (SET/KITS/TRAYS/PACK) ×3 IMPLANT
WATER STERILE IRR 1000ML POUR (IV SOLUTION) ×3 IMPLANT

## 2022-01-03 NOTE — H&P (Signed)
Chelsea Benson is an 71 y.o. female.   Chief Complaint: Back bilateral hip and leg pain HPI: 71 year old female longstanding issues with back previously undergone L3-S1 fusion over successive operations presents now with progressive worsening back bilateral hip and leg pain rating down L2-L3 nerve root pattern.  Work-up revealed segmental generation at L2-3 above her previous 3-4 fusion.  Due to patient's progression of clinical syndrome imaging findings and failed conservative treatment I recommended decompressive laminectomy interbody fusion at L2-3 with tying into her old construct or removal at L3-4.  Extensive went over the risks and benefits of the operation with her as well as perioperative course expectations of outcome and alternatives to surgery and she understands and agrees to proceed forward.  Past Medical History:  Diagnosis Date   Allergic rhinoconjunctivitis    Anginal pain (HCC)    Anxiety    Arthritis    Spondylolysis, knee and ankle arthritis pains.   Asthma    Bipolar disorder ([place])    Chronic back pain    Has had several surgeries.  He uses a walker   Depression    Diabetes mellitus without complication ([place])    dx in 2007   Diabetes mellitus, type II ([place])    Dysrhythmia    Eczema    Headache(784.0)    sinus related    History of kidney stones    History of nephrolithiasis    Hx of pulmonary embolus 2017   Hyperlipidemia    Hypertension    Insomnia    Memory change    OSA on CPAP    PSG 05/24/2016: AHI 17/hour 02 mean 76%.  HSAT 06/13/17, AHI 11-hour 02 mean 79%   Paroxysmal atrial fibrillation ([place]) 02/24/2021   CHA2DS2-VASc score 4 (HTN, DM, female, age-10); on Eliquis   Pneumonia    Tremor of both hands     Past Surgical History:  Procedure Laterality Date   ABDOMINAL HYSTERECTOMY     BACK SURGERY     2012- lumbar fusion   BREAST SURGERY     L cyst removed - 1972   BUNIONECTOMY Left    Great toe   calf -R- cyst removed     CARPAL TUNNEL RELEASE      both hands    CORONARY CT ANGIOGRAM  07/17/2019    Coronary calcium score 0.  Right dominant system.  Difficult to assess because of cardiac motion, but there is no obvious evidence of CAD.  Mild PA dilation.    HAMMER TOE SURGERY Left    L foot   NASAL SINUS SURGERY     NM MYOVIEW LTD  12/2008   Normal study.  Normal LV function.  EF 61%.  Apical thinning but no ischemia or infarction.   SHOULDER ARTHROSCOPY Left    R shoulder- RCR   TOE SURGERY     TOTAL KNEE ARTHROPLASTY Right 05/22/2017   Procedure: TOTAL KNEE ARTHROPLASTY;  Surgeon: Frederik Pear, MD;  Location: [place];  Service: Orthopedics;  Laterality: Right;   TRANSTHORACIC ECHOCARDIOGRAM  06/2009    Technically difficult.  Moderate concentric LVH with normal LV function.  No regional wall motion normalities.  EF> 55%.  Normal right ventricle.  No valve lesions.  Normal filling pressures.   TRIGGER FINGER RELEASE     L thumb   TUBAL LIGATION      Family History  Problem Relation Age of Onset   Diabetes Sister    Diabetes Brother    Hypertension Brother  Cancer Maternal Grandmother    Heart failure Maternal Grandmother    Hypertension Paternal Grandmother    Asthma Daughter    Cancer Daughter    Depression Daughter    Hypertension Daughter    Heart failure Maternal Aunt    Pneumonia Mother    Diabetes Mother    Alcoholism Father    Alcohol abuse Father    Depression Daughter    Schizophrenia Grandchild    Allergies Neg Hx    Eczema Neg Hx    Immunodeficiency Neg Hx    Social History:  reports that she has never smoked. She has been exposed to tobacco smoke. She has never used smokeless tobacco. She reports current alcohol use. She reports that she does not use drugs.  Allergies:  Allergies  Allergen Reactions   Ramipril Other (See Comments)   Cymbalta [Duloxetine Hcl] Other (See Comments)    Caused hair loss   Methylprednisolone Acetate Hives and Rash    05/29/20 developed hives "all over my back" after  MBB; resolved with Benadryl and "a few days."  Can tolerate Dexamethasone/Decadron. 05/29/20 developed hives "all over my back" after MBB; resolved with Benadryl and "a few days."  Can tolerate Dexamethasone/Decadron.   Rosuvastatin Calcium Other (See Comments) and Cough    Rapid heart rate   Almond Meal Itching and Swelling    Tongue swells and itches   Almond Oil Itching and Swelling    Itching and swelling of tongue    Carvedilol Cough   Fish Allergy Itching    Halibut fish   Losartan Potassium Cough   Morphine And Related Itching    Medications Prior to Admission  Medication Sig Dispense Refill   albuterol (PROVENTIL) (2.5 MG/3ML) 0.083% nebulizer solution USE 1 VIAL IN NEBULIZER EVERY 4 HOURS - and as needed 75 mL 0   apixaban (ELIQUIS) 5 MG TABS tablet Take 1 tablet (5 mg total) by mouth 2 (two) times daily. 180 tablet 3   ARIPiprazole (ABILIFY) 2 MG tablet Take 2 tablets (4 mg total) by mouth daily. 180 tablet 1   Bepotastine Besilate (BEPREVE) 1.5 % SOLN Place 1 drop into both eyes 2 (two) times daily as needed (for itchy water eyes). 10 mL 3   budesonide-formoterol (SYMBICORT) 160-4.5 MCG/ACT inhaler INHALE 2 INHALATIONS BY  MOUTH INTO THE LUNGS IN THE MORNING AND AT BEDTIME 10.2 g 5   busPIRone (BUSPAR) 30 MG tablet Take 1 tablet (30 mg total) by mouth 2 (two) times daily. 180 tablet 2   CALCIUM-VITAMIN D PO Take 1 tablet by mouth daily.     Carbinoxamine Maleate 4 MG TABS Take 1 tablet (4 mg total) by mouth in the morning and at bedtime. 60 tablet 5   CLOBETASOL PROPIONATE E 0.05 % emollient cream Apply 1 application topically 2 (two) times daily as needed (rash).      clonazePAM (KLONOPIN) 0.5 MG tablet Take 1/2-1 tab po TID prn anxiety. (Do not combine with Ambien) 90 tablet 2   diltiazem (CARDIZEM CD) 300 MG 24 hr capsule Take 1 capsule (300 mg total) by mouth daily. 90 capsule 0   EPINEPHrine 0.3 mg/0.3 mL IJ SOAJ injection INJECT INTRAMUSCULARLY 1  PEN AS NEEDED FOR  ALLERGIC  RESPONSE AS DIRECTED BY MD. SEEK MEDICAL HELP AFTER  USE. 2 each 0   famotidine (PEPCID) 20 MG tablet TAKE 1 TABLET BY MOUTH DAILY  AFTER SUPPER 90 tablet 1   fluticasone (FLONASE) 50 MCG/ACT nasal spray Place 2 sprays into both  nostrils daily. (Patient taking differently: Place 2 sprays into both nostrils in the morning and at bedtime.) 16 g 5   hydrochlorothiazide (HYDRODIURIL) 25 MG tablet TAKE 1 TABLET(25 MG) BY MOUTH DAILY 90 tablet 3   ipratropium (ATROVENT) 0.06 % nasal spray 2 sprays each nostril up to 3-4 times a day as needed for nasal drainage control 45 mL 1   Lemborexant (DAYVIGO) 10 MG TABS Take 1 tablet by mouth at bedtime. 90 tablet 1   meloxicam (MOBIC) 7.5 MG tablet Take 1 tablet (7.5 mg total) by mouth daily. 30 tablet 0   metFORMIN (GLUCOPHAGE-XR) 500 MG 24 hr tablet Take 500 mg by mouth in the morning and at bedtime.     mirtazapine (REMERON) 30 MG tablet TAKE 1 TABLET BY MOUTH AT  BEDTIME 90 tablet 3   montelukast (SINGULAIR) 10 MG tablet Take 1 tablet (10 mg total) by mouth at bedtime. 30 tablet 5   NUCALA 100 MG/ML SOAJ INJECT 100MG SUBCUTANEOUSLY EVERY 4 WEEKS (GIVEN AT MD  OFFICE) 1 mL 11   Oxcarbazepine (TRILEPTAL) 300 MG tablet Take 2 tablets (600 mg total) by mouth at bedtime. 180 tablet 2   oxybutynin (DITROPAN-XL) 10 MG 24 hr tablet Take 10 mg by mouth daily.     pantoprazole (PROTONIX) 40 MG tablet TAKE 1 TABLET BY MOUTH  DAILY. TAKE 30 TO 60  MINUTES BEFORE FIRST MEAL  OF THE DAY 90 tablet 3   Polyethyl Glycol-Propyl Glycol (SYSTANE OP) Place 1 drop into both eyes 2 (two) times daily as needed (dry eyes).     potassium chloride SA (KLOR-CON) 20 MEQ tablet Take 1 tablet (20 mEq total) by mouth daily. 90 tablet 3   pravastatin (PRAVACHOL) 20 MG tablet Take 1 tablet (20 mg total) by mouth at bedtime. 90 tablet 3   pregabalin (LYRICA) 100 MG capsule Take 100 mg by mouth 3 (three) times daily.     Semaglutide, 1 MG/DOSE, (OZEMPIC, 1 MG/DOSE,) 2 MG/1.5ML SOPN  Take 1 mg by mouth once a week.     tiZANidine (ZANAFLEX) 4 MG tablet Take 1 tablet (4 mg total) by mouth every 6 (six) hours as needed for muscle spasms. 30 tablet 0   triamcinolone cream (KENALOG) 0.1 % 1 application topically below the face avoiding the groin, and underarms 45 g 0   zolpidem (AMBIEN) 10 MG tablet Take 0.5-1 tablets (5-10 mg total) by mouth at bedtime as needed. for sleep 90 tablet 1   ACCU-CHEK GUIDE test strip TEST BID PRN  1   albuterol (VENTOLIN HFA) 108 (90 Base) MCG/ACT inhaler Inhale 2 puffs into the lungs every 4 (four) hours as needed for wheezing or shortness of breath. 18 g 1   Blood Glucose Monitoring Suppl (ACCU-CHEK GUIDE) w/Device KIT See admin instructions.  1   Carbinoxamine Maleate 4 MG TABS Take 2 tablets (8 mg total) by mouth in the morning and at bedtime. (Patient not taking: Reported on 12/20/2021) 120 tablet 5   Carbinoxamine Maleate 4 MG TABS TAKE 2 TABLETS BY MOUTH  TWICE DAILY AS NEEDED (Patient not taking: Reported on 12/20/2021) 360 tablet 0   desonide (DESOWEN) 0.05 % ointment APPLY TO AFFECTED AREA(S)  TOPICALLY TWICE DAILY AS  NEEDED 180 g 1   diltiazem (CARDIZEM) 60 MG tablet TAKE 1 TABLET BY MOUTH AS NEEDED FOR PALPITIATIONS. TAKE EVERY 6 HOURS UP TO TWICE DAILY WITH EACH EPISODE(THIS IS ADDITION TO 300MG DILITAZEM) 180 tablet 0   Respiratory Therapy Supplies (NEBULIZER) DEVI Use  as directed with nebulizer solution. 1 each 1   Respiratory Therapy Supplies (NEBULIZER/TUBING/MOUTHPIECE) KIT Use as directed with nebulizer machine 1 kit 12   trimethoprim-polymyxin b (POLYTRIM) ophthalmic solution INSTILL 1 DROP INTO BOTH  EYES EVERY 3 HOURS FOR 7  DAYS AND THEN STOP AS  DIRECTED (Patient not taking: Reported on 12/20/2021) 10 mL 0    Results for orders placed or performed during the hospital encounter of 01/03/22 (from the past 48 hour(s))  Glucose, capillary     Status: Abnormal   Collection Time: 01/03/22  7:53 AM  Result Value Ref Range    Glucose-Capillary 109 (H) 70 - 99 mg/dL    Comment: Glucose reference range applies only to samples taken after fasting for at least 8 hours.   No results found.  Review of Systems  Musculoskeletal:  Positive for back pain.  Neurological:  Positive for numbness.   Blood pressure (!) 137/92, pulse 96, temperature 97.9 F (36.6 C), temperature source Oral, resp. rate 18, height 5' 4"  (1.626 m), weight 75.3 kg, SpO2 96 %. Physical Exam HENT:     Head: Normocephalic.     Right Ear: Tympanic membrane normal.     Nose: Nose normal.  Cardiovascular:     Rate and Rhythm: Normal rate.  Pulmonary:     Effort: Pulmonary effort is normal.  Abdominal:     General: Abdomen is flat.  Musculoskeletal:        General: Normal range of motion.  Skin:    General: Skin is warm.  Neurological:     Mental Status: She is alert.     Comments: During this 5-5 iliopsoas, quads, hamstrings, gastroc, tibialis, and EHL.     Assessment/Plan 71 year old presents for decompressive laminectomy interbody fusion L2-3  Elaina Hoops, MD 01/03/2022, 9:33 AM

## 2022-01-03 NOTE — Op Note (Signed)
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  Posterior lumbar interbody fusion L2-3 utilizing the globus titanium cages packed with locally harvested autograft mixed with vivigen and protios  3.  Cortical screw fixation utilizing the globus Creo amp modular cortical screw set with 2 new cortical screws placed at L2 and utilizing the addition set tying into her her old construct at L3-4  Surgeon: Jillyn Hidden Renlee Floor  Assistant: Julien Girt  Anesthesia: General  EBL: Minimal  Complications: Inadvertent dural tear  HPI: 71 year old female progressive worsening back and bilateral hip and leg pain work-up revealed progressive segmental degeneration above her previous fusion at L2-3 with a herniated disc and severe spinal stenosis at that level.  Due to patient's progression of clinical syndrome imaging findings and failed conservative treatment I recommended decompression and interbody fusion at L2-3.  I extensively reviewed the risks and benefits of the operation with her as well as perioperative course expectations of outcome and alternatives of surgery and she understood and agreed to proceed forward.  Operative procedure: Patient was brought into the OR was [facility] general anesthesia positioned prone the Wilson frame her back was prepped and draped in routine sterile fashion her old incision was opened up and extended slightly cephalad subperiosteal dissection was carried lamina of L1-L2 and expose the hardware at L3-4.  Then remove the spinous process at L2 performed a central  decompression was marked hourglass compression of thecal sac with a extensive mount of epidural fibrosis causing severe hourglass compression of thecal sac at the level of facet joint this was all freed up teased away and complete medial facetectomies were performed blew through the pars interarticularis at L2 performed radical foraminotomies of the L2 nerve root freed up the scar tissue and we did perform redo foraminotomies of the L3 nerve root great aggressively under bit the supra reticulating facet to gain access lateral margin disc base but was a small canal.  Had a narrow working channel.  Coagulated the epidural veins cleaned out the disc base and utilizing sequential distraction with a 10 distractor in place I selected 8-11 titanium cages and after packing them with the autograft mix inserted them sequentially with extensive mount of autograft mix centrally.  Due to the very narrow working channel there was a small inadvertent dural tear in the axilla of the left-sided L2 nerve root was too small and it was more inferior ventral not able to be primarily repaired but when it was packed with Gelfoam and a small clot and a cottonoid there was no CSF leak from it.  So proceeded on and finished by placing cortical screws at L2 bilaterally and utilizing the new addition set with globus placed to medial connectors popped on the screw heads and connected up with 55 mm rods I did a gentle compression of L2 against the connector anchored everything in place and tightened everything down.  I then reinspected the foramina to confirm patency no migration of graft material I then lay Gelfoam in the dura on the patient's right side I exposed the durotomy defect placed a small piece of one by one DuraGen plus over it and squirted the fibrin glue  product the green glue over the thecal sac and over the area where the dural tear was.  There was no CSF that was leaking out of it at the time.  We then closed the wound tightly  in layers with interrupted 0 was in the fascia I did inject Exparel in the fascia 2O's in the subcutaneous cutaneous tissue and a running 4 subcuticular.  Dermabond benzoin Steri-Strips and a sterile dressing was applied patient recovery in stable condition.  At the end the case all needle counts and sponge counts were correct.

## 2022-01-03 NOTE — Anesthesia Postprocedure Evaluation (Signed)
Anesthesia Post Note  Patient: Chelsea Benson  Procedure(s) Performed: Posterior Lumbar Interbody Fusion Lumbar two-three (Spine Lumbar)     Patient location during evaluation: PACU Anesthesia Type: General Level of consciousness: awake and alert Pain management: pain level controlled Vital Signs Assessment: post-procedure vital signs reviewed and stable Respiratory status: spontaneous breathing, nonlabored ventilation, respiratory function stable and patient connected to nasal cannula oxygen Cardiovascular status: blood pressure returned to baseline and stable Postop Assessment: no apparent nausea or vomiting Anesthetic complications: no   No notable events documented.  Last Vitals:  Vitals:   01/03/22 0751 01/03/22 1355  BP: (!) 137/92 107/82  Pulse: 96 81  Resp: 18   Temp: 36.6 C 36.5 C  SpO2: 96% 99%    Last Pain:  Vitals:   01/03/22 1425  TempSrc:   PainSc: 7                  Collene Schlichter

## 2022-01-03 NOTE — Anesthesia Procedure Notes (Signed)
Procedure Name: Intubation Date/Time: 01/03/2022 10:36 AM Performed by: Amadeo Garnet, CRNA Pre-anesthesia Checklist: Patient identified, Emergency Drugs available, Suction available and Patient being monitored Patient Re-evaluated:Patient Re-evaluated prior to induction Oxygen Delivery Method: Circle system utilized Preoxygenation: Pre-oxygenation with 100% oxygen Induction Type: IV induction Ventilation: Mask ventilation without difficulty Laryngoscope Size: Mac and 4 Grade View: Grade II Tube type: Oral Tube size: 7.0 mm Number of attempts: 1 Airway Equipment and Method: Stylet and Oral airway Placement Confirmation: ETT inserted through vocal cords under direct vision, positive ETCO2 and breath sounds checked- equal and bilateral Secured at: 22 cm Tube secured with: Tape Dental Injury: Teeth and Oropharynx as per pre-operative assessment

## 2022-01-03 NOTE — Transfer of Care (Signed)
Immediate Anesthesia Transfer of Care Note  Patient: Chelsea Benson  Procedure(s) Performed: Posterior Lumbar Interbody Fusion Lumbar two-three (Spine Lumbar)  Patient Location: PACU  Anesthesia Type:General  Level of Consciousness: awake, alert  and oriented  Airway & Oxygen Therapy: Patient Spontanous Breathing and Patient connected to face mask oxygen  Post-op Assessment: Report given to RN, Post -op Vital signs reviewed and stable and Patient moving all extremities  Post vital signs: Reviewed and stable  Last Vitals:  Vitals Value Taken Time  BP 107/82 01/03/22 1356  Temp    Pulse 80 01/03/22 1359  Resp 17 01/03/22 1359  SpO2 98 % 01/03/22 1359  Vitals shown include unvalidated device data.  Last Pain:  Vitals:   01/03/22 0834  TempSrc:   PainSc: 8       Patients Stated Pain Goal: 3 (01/03/22 0834)  Complications: No notable events documented.

## 2022-01-04 LAB — GLUCOSE, CAPILLARY
Glucose-Capillary: 124 mg/dL — ABNORMAL HIGH (ref 70–99)
Glucose-Capillary: 114 mg/dL — ABNORMAL HIGH (ref 70–99)
Glucose-Capillary: 124 mg/dL — ABNORMAL HIGH (ref 70–99)
Glucose-Capillary: 136 mg/dL — ABNORMAL HIGH (ref 70–99)

## 2022-01-04 NOTE — Progress Notes (Signed)
Subjective: Patient reports no headaches but pretty sleepy.   Objective: Vital signs in last 24 hours: Temp:  [97.7 F (36.5 C)-98.4 F (36.9 C)] 98.3 F (36.8 C) (01/31 0347) Pulse Rate:  [77-95] 95 (01/31 0347) Resp:  [11-20] 18 (01/31 0347) BP: (107-171)/(75-113) 124/75 (01/31 0347) SpO2:  [96 %-100 %] 97 % (01/31 0347)  Intake/Output from previous day: 01/30 0701 - 01/31 0700 In: 1100 [I.V.:1000; IV Piggyback:100] Out: 1575 [Urine:1075; Blood:500] Intake/Output this shift: No intake/output data recorded.  Neurologic: Grossly normal  Lab Results: Lab Results  Component Value Date   WBC 4.2 12/23/2021   HGB 12.5 12/23/2021   HCT 40.8 12/23/2021   MCV 86.1 12/23/2021   PLT 297 12/23/2021   Lab Results  Component Value Date   INR 0.98 05/11/2017   BMET Lab Results  Component Value Date   NA 142 12/23/2021   K 3.6 12/23/2021   CL 106 12/23/2021   CO2 28 12/23/2021   GLUCOSE 149 (H) 12/23/2021   BUN 16 12/23/2021   CREATININE 0.82 12/23/2021   CALCIUM 10.0 12/23/2021    Studies/Results: DG Lumbar Spine 2-3 Views  Result Date: 01/03/2022 CLINICAL DATA:  Fluoroscopic imaging during PLIF of L2-L3. EXAM: LUMBAR SPINE - 2-3 VIEW COMPARISON:  None. FINDINGS: Intraoperative fluoroscopic images for posterior lumbar fusion at L2-L3. Total fluoroscopic time was 34 seconds. Total radiation dose was 17.59 mGy. Posterior spinal fusion at L3-L4 as well as disc spacer devices at L4-L5 and L5-S1. IMPRESSION: Intraoperative utilization of fluoroscopy. Please see operative report for further details. Electronically Signed   By: Larose Hires D.O.   On: 01/03/2022 15:11   DG C-Arm 1-60 Min-No Report  Result Date: 01/03/2022 Fluoroscopy was utilized by the requesting physician.  No radiographic interpretation.    Assessment/Plan: Postop day 1 lumbar fusion. Will mobilize today and see how she does.    LOS: 1 day    Chelsea Benson Inez Rosato 01/04/2022, 8:00 AM

## 2022-01-04 NOTE — TOC Progression Note (Signed)
Transition of Care [facility]) - Progression Note    Patient Details  Name: Chelsea Benson MRN: [id number] Date of Birth: [birth year]  Transition of Care [facility]) CM/SW Contact  Dominque Levandowski, Adria Devon, RN Phone Number: 01/04/2022, 8:29 AM  Clinical Narrative:     Dr Lovell Sheehan office has arranged home health through [facility] , per Liborio Nixon with [facility] no home health orders needed.    3C staff will provide any needed DME.      Transition of Care [facility]) Screening Note   Patient Details  Name: Chelsea Benson Date of Birth: 1951-07-24   Transition of Care Department [facility]) has reviewed patient and no TOC needs have been identified at this time. We will continue to monitor patient advancement through interdisciplinary progression rounds. If new patient transition needs arise, please place a TOC consult.         Expected Discharge Plan and Services                                                 Social Determinants of Health (SDOH) Interventions    Readmission Risk Interventions No flowsheet data found.

## 2022-01-04 NOTE — Evaluation (Signed)
Physical Therapy Evaluation  Patient Details Name: Chelsea Benson MRN: [id number] DOB: 15-Feb-1951 Today's Date: 01/04/2022  History of Present Illness  Pt is a 71 y/o female who presents s/p L2-L3 PLIF on 01/03/2022. PMH significant for bipolar disorder, DM, history of PE, HTN, bilateral hand tremors, lumbar fusion 2012, R TKR.   Clinical Impression  Pt admitted with above diagnosis. At the time of PT eval, pt was able to demonstrate transfers and ambulation with gross min guard assist to supervision for safety and RW for support. Pt was educated on precautions, brace application/wearing schedule, appropriate activity progression, and car transfer. Pt currently with functional limitations due to the deficits listed below (see PT Problem List). Pt will benefit from skilled PT to increase their independence and safety with mobility to allow discharge to the venue listed below.         Recommendations for follow up therapy are one component of a multi-disciplinary discharge planning process, led by the attending physician.  Recommendations may be updated based on patient status, additional functional criteria and insurance authorization.  Follow Up Recommendations Home health PT    Assistance Recommended at Discharge Frequent or constant Supervision/Assistance  Patient can return home with the following  A little help with walking and/or transfers;A little help with bathing/dressing/bathroom;Assist for transportation    Equipment Recommendations None recommended by PT  Recommendations for Other Services       Functional Status Assessment Patient has had a recent decline in their functional status and demonstrates the ability to make significant improvements in function in a reasonable and predictable amount of time.     Precautions / Restrictions Precautions Precautions: Fall;Back Precaution Booklet Issued: Yes (comment) Precaution Comments: Reviewed handout and pt was cued for  precautions during functional mobility. Restrictions Weight Bearing Restrictions: No      Mobility  Bed Mobility               General bed mobility comments: Pt was received standing in room with nursing staff present.    Transfers Overall transfer level: Needs assistance Equipment used: Rolling walker (2 wheels) Transfers: Sit to/from Stand Sit to Stand: Min guard           General transfer comment: Close guard for safety as pt transitioned to/from stand. VC's for hand placement on seated surface for safety.    Ambulation/Gait Ambulation/Gait assistance: Min guard Gait Distance (Feet): 400 Feet Assistive device: Rolling walker (2 wheels) Gait Pattern/deviations: Step-through pattern, Decreased stride length, Trunk flexed Gait velocity: Decreased Gait velocity interpretation: <1.31 ft/sec, indicative of household ambulator   General Gait Details: VC's for improved posture, closer walker proximity, and forward gaze. Pt with very slow pace and short step/stride length. Able to make minor corrective changes but unable to maintain.  Stairs            Wheelchair Mobility    Modified Rankin (Stroke Patients Only)       Balance Overall balance assessment: Needs assistance Sitting-balance support: No upper extremity supported, Feet supported Sitting balance-Leahy Scale: Fair     Standing balance support: No upper extremity supported, During functional activity Standing balance-Leahy Scale: Poor Standing balance comment: flexed trunk and unsteadiness noted.                             Pertinent Vitals/Pain Pain Assessment Pain Assessment: No/denies pain    Home Living Family/patient expects to be discharged to:: Private residence Living Arrangements: Other  relatives (brother) Available Help at Discharge: Family;Available 24 hours/day (Between adult grandson, brother, and daughters) Type of Home: House Home Access: Level entry (If goes  around to the porch)       Home Layout: One level Home Equipment: Conservation officer, nature (2 wheels);BSC/3in1;Cane - single point;Rollator (4 wheels)      Prior Function Prior Level of Function : Independent/Modified Independent             Mobility Comments: Not requiring any assistance for mobility or ADL's as long as pt had DME. Pt reports she was using either the rollator or the [facility]. ADLs Comments: One BSC over the toilet, one BSC in the shower     Hand Dominance   Dominant Hand: Right    Extremity/Trunk Assessment   Upper Extremity Assessment Upper Extremity Assessment: Defer to OT evaluation    Lower Extremity Assessment Lower Extremity Assessment: Generalized weakness (Consistent with pre-op diagnosis)    Cervical / Trunk Assessment Cervical / Trunk Assessment: Back Surgery  Communication   Communication: No difficulties  Cognition Arousal/Alertness: Awake/alert Behavior During Therapy: Restless, Flat affect Overall Cognitive Status: Impaired/Different from baseline Area of Impairment: Memory, Safety/judgement, Problem solving                     Memory: Decreased short-term memory, Decreased recall of precautions   Safety/Judgement: Decreased awareness of safety, Decreased awareness of deficits   Problem Solving: Slow processing, Requires verbal cues General Comments: Pt repeating herself throughout session. When PT arrived, pt was standing in the room with nursing staff s/p getting up by herself and no AD to undress and go through her suitcase. Pt insistent that she needed a new RW and BSC even though already had both. Reports she will pay out of pocket for them after confirming that she wanted extra equipment that she already had. When RN discussed with pt, she reports she already has DME and does not require extra. Did not recall conversation with PT.        General Comments      Exercises     Assessment/Plan    PT Assessment Patient needs  continued PT services  PT Problem List Decreased strength;Decreased activity tolerance;Decreased balance;Decreased mobility;Decreased knowledge of use of DME;Decreased safety awareness;Decreased knowledge of precautions;Pain       PT Treatment Interventions DME instruction;Gait training;Stair training;Functional mobility training;Therapeutic activities;Therapeutic exercise;Neuromuscular re-education;Patient/family education    PT Goals (Current goals can be found in the Care Plan section)  Acute Rehab PT Goals Patient Stated Goal: Be able to return home and be by herself PT Goal Formulation: With patient Time For Goal Achievement: 01/11/22 Potential to Achieve Goals: Good    Frequency Min 5X/week     Co-evaluation               AM-PAC PT "6 Clicks" Mobility  Outcome Measure Help needed turning from your back to your side while in a flat bed without using bedrails?: None Help needed moving from lying on your back to sitting on the side of a flat bed without using bedrails?: A Little Help needed moving to and from a bed to a chair (including a wheelchair)?: A Little Help needed standing up from a chair using your arms (e.g., wheelchair or bedside chair)?: A Little Help needed to walk in hospital room?: A Little Help needed climbing 3-5 steps with a railing? : A Little 6 Click Score: 19    End of Session Equipment Utilized During Treatment: Gait belt  Activity Tolerance: Patient tolerated treatment well Patient left: in chair;with call bell/phone within reach Nurse Communication: Mobility status PT Visit Diagnosis: Unsteadiness on feet (R26.81);Pain Pain - part of body:  (back)    Time: 1006-1030 PT Time Calculation (min) (ACUTE ONLY): 24 min   Charges:   PT Evaluation $PT Eval Low Complexity: 1 Low PT Treatments $Gait Training: 8-22 mins        Rolinda Roan, PT, DPT Acute Rehabilitation Services Pager: [phone] Office: [phone]   Thelma Comp 01/04/2022, 1:39 PM

## 2022-01-05 LAB — GLUCOSE, CAPILLARY: Glucose-Capillary: 107 mg/dL — ABNORMAL HIGH (ref 70–99)

## 2022-01-05 MED ORDER — HYDROCODONE-ACETAMINOPHEN 5-325 MG PO TABS: 1.0000 | ORAL_TABLET | ORAL | Status: DC | PRN

## 2022-01-05 MED ORDER — HYDROCODONE-ACETAMINOPHEN 5-325 MG PO TABS: 1.0000 | ORAL_TABLET | ORAL | 0 refills | Status: DC | PRN

## 2022-01-05 NOTE — Discharge Summary (Signed)
Physician Discharge Summary  Patient ID: Chelsea Benson MRN: [id number] DOB/AGE: [birth year] 71 y.o. Estimated body mass index is 28.49 kg/m as calculated from the following:   Height as of this encounter: 5' 4"  (1.626 m).   Weight as of this encounter: 75.3 kg.   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 2 the old construct L3-4.  Patient postoperatively went to recovery room in the floor was ambulating voiding spontaneously tolerating regular diet stable for discharge.  Patient was kept flat for the first 24 hours secondary to intraoperative CSF leak but time of discharge, patient displayed no evidence of leak or postural headache.    Consults: Significant Diagnostic Studies: Treatments: L2-3 PLIF Discharge Exam: Blood pressure 102/74, pulse (!) 116, temperature 99.1 F (37.3 C), temperature source Oral, resp. rate 16, height 5' 4"  (1.626 m), weight 75.3 kg, SpO2 95 %. Strength 5 out of 5 wound clean and dry  Disposition: Home  Discharge Instructions     Face-to-face encounter (required for Medicare/Medicaid patients)   Complete by: As directed    I Elaina Hoops certify that this patient is under my care and that I, or a nurse practitioner or physician's assistant working with me, had a face-to-face encounter that meets the physician face-to-face encounter requirements with this patient on 01/05/2022. The encounter with the patient was in whole, or in part for the following medical condition(s) which is the primary reason for home health care (List medical condition): Degenerative disc disease lumbar spinal stenosis   The encounter with the patient was in whole, or in part, for the following medical condition, which is the  primary reason for home health care: Degenerative disc disease lumbar spinal stenosis   I certify that, based on my findings, the following services are medically necessary home health services: Physical therapy   Reason for Medically Necessary Home Health Services: Therapy- Instruction on Safe use of Assistive Devices for ADLs   My clinical findings support the need for the above services: Pain interferes with ambulation/mobility   Further, I certify that my clinical findings support that this patient is homebound due to: Pain interferes with ambulation/mobility   Home Health   Complete by: As directed    To provide the following care/treatments:  PT OT        Allergies as of 01/05/2022       Reactions   Ramipril Other (See Comments)   Cymbalta [duloxetine Hcl] Other (See Comments)   Caused hair loss   Methylprednisolone Acetate Hives, Rash   05/29/20 developed hives "all over my back" after MBB; resolved with Benadryl and "a few days."  Can tolerate Dexamethasone/Decadron. 05/29/20 developed hives "all over my back" after MBB; resolved with Benadryl and "a few days."  Can tolerate Dexamethasone/Decadron.   Rosuvastatin Calcium Other (See Comments), Cough   Rapid heart rate   Almond Meal Itching, Swelling   Tongue swells and itches   Almond Oil Itching, Swelling   Itching and swelling of tongue   Carvedilol Cough   Fish Allergy Itching   Halibut fish   Losartan Potassium Cough   Morphine And Related Itching        Medication List     TAKE these medications    Accu-Chek Guide test strip Generic drug: glucose blood TEST BID PRN  Accu-Chek Guide w/Device Kit See admin instructions.   albuterol (2.5 MG/3ML) 0.083% nebulizer solution Commonly known as: PROVENTIL USE 1 VIAL IN NEBULIZER EVERY 4 HOURS - and as needed   albuterol 108 (90 Base) MCG/ACT inhaler Commonly known as: VENTOLIN HFA Inhale 2 puffs into the lungs every 4 (four) hours as needed for wheezing or  shortness of breath.   apixaban 5 MG Tabs tablet Commonly known as: Eliquis Take 1 tablet (5 mg total) by mouth 2 (two) times daily.   ARIPiprazole 2 MG tablet Commonly known as: ABILIFY Take 2 tablets (4 mg total) by mouth daily.   Bepotastine Besilate 1.5 % Soln Commonly known as: Bepreve Place 1 drop into both eyes 2 (two) times daily as needed (for itchy water eyes).   budesonide-formoterol 160-4.5 MCG/ACT inhaler Commonly known as: Symbicort INHALE 2 INHALATIONS BY  MOUTH INTO THE LUNGS IN THE MORNING AND AT BEDTIME   busPIRone 30 MG tablet Commonly known as: BUSPAR Take 1 tablet (30 mg total) by mouth 2 (two) times daily.   CALCIUM-VITAMIN D PO Take 1 tablet by mouth daily.   Carbinoxamine Maleate 4 MG Tabs Take 1 tablet (4 mg total) by mouth in the morning and at bedtime.   Clobetasol Propionate E 0.05 % emollient cream Generic drug: Clobetasol Prop Emollient Base Apply 1 application topically 2 (two) times daily as needed (rash).   clonazePAM 0.5 MG tablet Commonly known as: KLONOPIN Take 1/2-1 tab po TID prn anxiety. (Do not combine with Ambien)   DayVigo 10 MG Tabs Generic drug: Lemborexant Take 1 tablet by mouth at bedtime.   desonide 0.05 % ointment Commonly known as: DESOWEN APPLY TO AFFECTED AREA(S)  TOPICALLY TWICE DAILY AS  NEEDED   diltiazem 300 MG 24 hr capsule Commonly known as: CARDIZEM CD Take 1 capsule (300 mg total) by mouth daily.   diltiazem 60 MG tablet Commonly known as: CARDIZEM TAKE 1 TABLET BY MOUTH AS NEEDED FOR PALPITIATIONS. TAKE EVERY 6 HOURS UP TO TWICE DAILY WITH EACH EPISODE(THIS IS ADDITION TO 300MG DILITAZEM)   EPINEPHrine 0.3 mg/0.3 mL Soaj injection Commonly known as: EPI-PEN INJECT INTRAMUSCULARLY 1  PEN AS NEEDED FOR ALLERGIC  RESPONSE AS DIRECTED BY MD. SEEK MEDICAL HELP AFTER  USE.   famotidine 20 MG tablet Commonly known as: PEPCID TAKE 1 TABLET BY MOUTH DAILY  AFTER SUPPER   fluticasone 50 MCG/ACT nasal  spray Commonly known as: FLONASE Place 2 sprays into both nostrils daily. What changed: when to take this   hydrochlorothiazide 25 MG tablet Commonly known as: HYDRODIURIL TAKE 1 TABLET(25 MG) BY MOUTH DAILY   HYDROcodone-acetaminophen 5-325 MG tablet Commonly known as: NORCO/VICODIN Take 1-2 tablets by mouth every 4 (four) hours as needed for moderate pain ((score 7 to 10)).   ipratropium 0.06 % nasal spray Commonly known as: ATROVENT 2 sprays each nostril up to 3-4 times a day as needed for nasal drainage control   meloxicam 7.5 MG tablet Commonly known as: Mobic Take 1 tablet (7.5 mg total) by mouth daily.   metFORMIN 500 MG 24 hr tablet Commonly known as: GLUCOPHAGE-XR Take 500 mg by mouth in the morning and at bedtime.   mirtazapine 30 MG tablet Commonly known as: REMERON TAKE 1 TABLET BY MOUTH AT  BEDTIME   montelukast 10 MG tablet Commonly known as: SINGULAIR Take 1 tablet (10 mg total) by mouth at bedtime.   Nebulizer Brady Use as directed with nebulizer solution.   Nebulizer/Tubing/Mouthpiece Kit Use as directed with  nebulizer machine   Nucala 100 MG/ML Soaj Generic drug: Mepolizumab INJECT 100MG SUBCUTANEOUSLY EVERY 4 WEEKS (GIVEN AT MD  OFFICE)   Oxcarbazepine 300 MG tablet Commonly known as: TRILEPTAL Take 2 tablets (600 mg total) by mouth at bedtime.   oxybutynin 10 MG 24 hr tablet Commonly known as: DITROPAN-XL Take 10 mg by mouth daily.   Ozempic (1 MG/DOSE) 2 MG/1.5ML Sopn Generic drug: Semaglutide (1 MG/DOSE) Take 1 mg by mouth once a week.   pantoprazole 40 MG tablet Commonly known as: PROTONIX TAKE 1 TABLET BY MOUTH  DAILY. TAKE 30 TO 60  MINUTES BEFORE FIRST MEAL  OF THE DAY   potassium chloride SA 20 MEQ tablet Commonly known as: KLOR-CON M Take 1 tablet (20 mEq total) by mouth daily.   pravastatin 20 MG tablet Commonly known as: PRAVACHOL Take 1 tablet (20 mg total) by mouth at bedtime.   pregabalin 100 MG capsule Commonly known  as: LYRICA Take 100 mg by mouth 3 (three) times daily.   SYSTANE OP Place 1 drop into both eyes 2 (two) times daily as needed (dry eyes).   tiZANidine 4 MG tablet Commonly known as: Zanaflex Take 1 tablet (4 mg total) by mouth every 6 (six) hours as needed for muscle spasms.   triamcinolone cream 0.1 % Commonly known as: KENALOG 1 application topically below the face avoiding the groin, and underarms   zolpidem 10 MG tablet Commonly known as: AMBIEN Take 0.5-1 tablets (5-10 mg total) by mouth at bedtime as needed. for sleep        Follow-up Information     [facility] Follow up.   Specialty: Home Health Services Why: [place] information: [place] [id number] [phone]                 Signed: Elaina Hoops 01/05/2022, 9:43 AM

## 2022-01-05 NOTE — Progress Notes (Signed)
Patient alert and oriented, mae's well, voiding adequate amount of urine, swallowing without difficulty, no c/o pain at time of discharge. Patient discharged home with family. Script and discharged instructions given to patient. Patient and daughter stated understanding of instructions given. Patient has an appointment with Dr. Wynetta Emery in 2 weeks

## 2022-01-05 NOTE — Progress Notes (Signed)
Physical Therapy Treatment Patient Details Name: Chelsea PorteousFrankie M Schappert MRN: [id number] DOB: 08/31/1951 Today's Date: 01/05/2022   History of Present Illness Pt is a 71 y/o female who presents s/p L2-L3 PLIF on 01/03/2022. PMH significant for bipolar disorder, DM, history of PE, HTN, bilateral hand tremors, lumbar fusion 2012, R TKR.    PT Comments    Pt progressing slowly towards physical therapy goals. Noted L lateral drift during ambulation this session which was more apparent today than yesterday. Pt continues to be mildly confused and with poor recall of precautions. Focus of session was education on precautions, brace application/wearing schedule, activity progression and car transfer. PT and NT present at d/c to assist pt with car transfer into daughter's vehicle. Pt required up to heavy mod assist for safe entry into car while maintaining back precautions.     Recommendations for follow up therapy are one component of a multi-disciplinary discharge planning process, led by the attending physician.  Recommendations may be updated based on patient status, additional functional criteria and insurance authorization.  Follow Up Recommendations  Home health PT     Assistance Recommended at Discharge Frequent or constant Supervision/Assistance  Patient can return home with the following A little help with walking and/or transfers;A little help with bathing/dressing/bathroom;Assist for transportation   Equipment Recommendations  None recommended by PT    Recommendations for Other Services       Precautions / Restrictions Precautions Precautions: Fall;Back Precaution Booklet Issued: Yes (comment) Precaution Comments: Reviewed handout and pt was cued for precautions during ADLs Restrictions Weight Bearing Restrictions: No     Mobility  Bed Mobility               General bed mobility comments: Pt was received standing in room with RN/NT present.    Transfers Overall transfer  level: Needs assistance Equipment used: Rolling walker (2 wheels) Transfers: Sit to/from Stand Sit to Stand: Min assist           General transfer comment: Poor safety awareness. Required assist to guide hips around as pt beginning to sit before she was lined up with EOB.    Ambulation/Gait Ambulation/Gait assistance: Min guard, Min assist Gait Distance (Feet): 250 Feet Assistive device: Rolling walker (2 wheels) Gait Pattern/deviations: Step-through pattern, Decreased stride length, Trunk flexed Gait velocity: Decreased Gait velocity interpretation: <1.31 ft/sec, indicative of household ambulator   General Gait Details: VC's for improved posture, closer walker proximity, and forward gaze. Slow and guarded, with L drift. Assist required to keep pt from running into L wall.   Stairs             Wheelchair Mobility    Modified Rankin (Stroke Patients Only)       Balance Overall balance assessment: Needs assistance Sitting-balance support: No upper extremity supported, Feet supported Sitting balance-Leahy Scale: Fair     Standing balance support: No upper extremity supported, During functional activity Standing balance-Leahy Scale: Fair Standing balance comment: flexed at the hips with static standing                            Cognition Arousal/Alertness: Awake/alert Behavior During Therapy: Flat affect, WFL for tasks assessed/performed Overall Cognitive Status: Impaired/Different from baseline Area of Impairment: Memory, Safety/judgement, Problem solving                     Memory: Decreased short-term memory, Decreased recall of precautions   Safety/Judgement: Decreased awareness of  safety, Decreased awareness of deficits   Problem Solving: Slow processing, Requires verbal cues General Comments: Poor recall of precautions        Exercises      General Comments        Pertinent Vitals/Pain Pain Assessment Pain Assessment:  No/denies pain    Home Living                          Prior Function            PT Goals (current goals can now be found in the care plan section) Acute Rehab PT Goals Patient Stated Goal: Be able to return home and be by herself PT Goal Formulation: With patient Time For Goal Achievement: 01/11/22 Potential to Achieve Goals: Good Progress towards PT goals: Progressing toward goals    Frequency    Min 5X/week      PT Plan Current plan remains appropriate    Co-evaluation              AM-PAC PT "6 Clicks" Mobility   Outcome Measure  Help needed turning from your back to your side while in a flat bed without using bedrails?: None Help needed moving from lying on your back to sitting on the side of a flat bed without using bedrails?: A Little Help needed moving to and from a bed to a chair (including a wheelchair)?: A Little Help needed standing up from a chair using your arms (e.g., wheelchair or bedside chair)?: A Little Help needed to walk in hospital room?: A Little Help needed climbing 3-5 steps with a railing? : A Little 6 Click Score: 19    End of Session Equipment Utilized During Treatment: Gait belt Activity Tolerance: Patient tolerated treatment well Patient left: in chair;with call bell/phone within reach Nurse Communication: Mobility status PT Visit Diagnosis: Unsteadiness on feet (R26.81);Pain Pain - part of body:  (back)     Time: [id number] PT Time Calculation (min) (ACUTE ONLY): 22 min  Charges:  $Gait Training: 8-22 mins                     Rolinda Roan, PT, DPT Acute Rehabilitation Services Pager: [phone] Office: [phone]    Thelma Comp 01/05/2022, 1:21 PM

## 2022-01-05 NOTE — Telephone Encounter (Signed)
error 

## 2022-01-05 NOTE — Discharge Instructions (Signed)

## 2022-01-05 NOTE — Evaluation (Signed)
Occupational Therapy Evaluation Patient Details Name: Chelsea Benson MRN: [id number] DOB: Aug 27, 1951 Today's Date: 01/05/2022   History of Present Illness Pt is a 71 y/o female who presents s/p L2-L3 PLIF on 01/03/2022. PMH significant for bipolar disorder, DM, history of PE, HTN, bilateral hand tremors, lumbar fusion 2012, R TKR.   Clinical Impression   Anntionette was generally mod I PTA with use of AD and DME for mobility and ADLs. She lives in a 1 level home with level entry and will have 24/7 assist from her family at d/c. Pt was unable to recall her back precautions or brace wear schedule, and she required max cues throughout to maintain precautions with little carryover noted. Overall she required min A for ADLs and min guard fro mobility with RW and cues throughout. She is limited by impaired cognition, generalized weakness and knowledge of compensatory techniques and will benefit from continued OT acutely, and HHOT at d/c.       Recommendations for follow up therapy are one component of a multi-disciplinary discharge planning process, led by the attending physician.  Recommendations may be updated based on patient status, additional functional criteria and insurance authorization.   Follow Up Recommendations  Home health OT       Patient can return home with the following A little help with walking and/or transfers;A little help with bathing/dressing/bathroom;Assistance with cooking/housework;Help with stairs or ramp for entrance;Assist for transportation    Functional Status Assessment  Patient has had a recent decline in their functional status and demonstrates the ability to make significant improvements in function in a reasonable and predictable amount of time.        Precautions / Restrictions Precautions Precautions: Fall;Back Precaution Booklet Issued: Yes (comment) Precaution Comments: Reviewed handout and pt was cued for precautions during ADLs Restrictions Weight Bearing  Restrictions: No      Mobility Bed Mobility Overal bed mobility: Needs Assistance             General bed mobility comments: pt was sitting EOB upon arrival    Transfers Overall transfer level: Needs assistance Equipment used: Rolling walker (2 wheels) Transfers: Sit to/from Stand Sit to Stand: Min guard           General transfer comment: Close guard for safety as pt transitioned to/from stand. VC's for hand placement on seated surface for safety. Poor carryover of hand placement, pt needed verbal cues with each transfer      Balance Overall balance assessment: Needs assistance Sitting-balance support: No upper extremity supported, Feet supported Sitting balance-Leahy Scale: Fair     Standing balance support: No upper extremity supported, During functional activity Standing balance-Leahy Scale: Fair Standing balance comment: flexed at the hips with static standing         ADL either performed or assessed with clinical judgement   ADL Overall ADL's : Needs assistance/impaired Eating/Feeding: Set up;Sitting   Grooming: Min guard;Standing Grooming Details (indicate cue type and reason): max cues for precautions during grooming Upper Body Bathing: Minimal assistance;Sitting   Lower Body Bathing: Minimal assistance;Sit to/from stand   Upper Body Dressing : Minimal assistance;Sitting   Lower Body Dressing: Minimal assistance;Sit to/from stand Lower Body Dressing Details (indicate cue type and reason): pt unable to obtain figure four with RLE Toilet Transfer: Min guard;Rolling walker (2 wheels) Toilet Transfer Details (indicate cue type and reason): significantly increased time Toileting- Clothing Manipulation and Hygiene: Supervision/safety;Sitting/lateral lean Toileting - Clothing Manipulation Details (indicate cue type and reason): verbal cues  Functional mobility during ADLs: Min guard;Rolling walker (2 wheels) General ADL Comments: pt requires maximal  cues throughout to maintain back precautions as well as significantly increased time.     Vision Baseline Vision/History: 1 Wears glasses Ability to See in Adequate Light: 0 Adequate Patient Visual Report: No change from baseline              Pertinent Vitals/Pain Pain Assessment Pain Assessment: No/denies pain     Hand Dominance Right   Extremity/Trunk Assessment Upper Extremity Assessment Upper Extremity Assessment: Generalized weakness   Lower Extremity Assessment Lower Extremity Assessment: Defer to PT evaluation   Cervical / Trunk Assessment Cervical / Trunk Assessment: Back Surgery   Communication Communication Communication: No difficulties   Cognition Arousal/Alertness: Awake/alert Behavior During Therapy: Flat affect, WFL for tasks assessed/performed Overall Cognitive Status: Impaired/Different from baseline Area of Impairment: Memory, Safety/judgement, Problem solving                     Memory: Decreased short-term memory, Decreased recall of precautions   Safety/Judgement: Decreased awareness of safety, Decreased awareness of deficits   Problem Solving: Slow processing, Requires verbal cues General Comments: Confusing today as Sunday. Unable to recall back precautions, required maximal repetitive cues to maintain throughout and pt stating "Oh. no twisting, that's right." Required increased time for processing, extremely slow for all tasks assessed.     General Comments  VSS on RA            Home Living Family/patient expects to be discharged to:: Private residence Living Arrangements: Other relatives Available Help at Discharge: Family;Available 24 hours/day Type of Home: House Home Access: Level entry     Home Layout: One level     Bathroom Shower/Tub: Chief Strategy OfficerTub/shower unit   Bathroom Toilet: Standard     Home Equipment: Agricultural consultantolling Walker (2 wheels);BSC/3in1;Cane - single point;Rollator (4 wheels)   Additional Comments: daughter is a Charity fundraiserN  who plans to assist as needed      Prior Functioning/Environment Prior Level of Function : Independent/Modified Independent             Mobility Comments: Not requiring any assistance for mobility or ADL's as long as pt had DME. Pt reports she was using either the rollator or the [facility]. ADLs Comments: One BSC over the toilet, one BSC in the shower        OT Problem List: Decreased strength;Decreased range of motion;Decreased activity tolerance;Impaired balance (sitting and/or standing);Decreased safety awareness;Decreased knowledge of precautions;Decreased knowledge of use of DME or AE;Pain      OT Treatment/Interventions: Self-care/ADL training;Therapeutic exercise;DME and/or AE instruction;Balance training;Patient/family education;Therapeutic activities    OT Goals(Current goals can be found in the care plan section) Acute Rehab OT Goals Patient Stated Goal: home OT Goal Formulation: With patient Time For Goal Achievement: 01/19/22 Potential to Achieve Goals: Good ADL Goals Pt Will Perform Grooming: with modified independence;standing Pt Will Perform Lower Body Dressing: with modified independence;sit to/from stand Pt Will Transfer to Toilet: with modified independence;ambulating Additional ADL Goal #1: pt will indep complete bed mobility with log roll as a precursor to OOB ADLs Additional ADL Goal #2: Pt will indep verbalize all back precautions and brace wear schedule.  OT Frequency: Min 2X/week       AM-PAC OT "6 Clicks" Daily Activity     Outcome Measure Help from another person eating meals?: None Help from another person taking care of personal grooming?: A Little Help from another person toileting, which includes using toliet,  bedpan, or urinal?: A Little Help from another person bathing (including washing, rinsing, drying)?: A Little Help from another person to put on and taking off regular upper body clothing?: A Little Help from another person to put on and taking  off regular lower body clothing?: A Little 6 Click Score: 19   End of Session Equipment Utilized During Treatment: Rolling walker (2 wheels);Back brace Nurse Communication: Mobility status  Activity Tolerance: Patient tolerated treatment well Patient left: in chair;with call bell/phone within reach  OT Visit Diagnosis: Unsteadiness on feet (R26.81);Other abnormalities of gait and mobility (R26.89);Pain;Muscle weakness (generalized) (M62.81)                Time: 6237-6283 OT Time Calculation (min): 23 min Charges:  OT General Charges $OT Visit: 1 Visit OT Evaluation $OT Eval Moderate Complexity: 1 Mod OT Treatments $Self Care/Home Management : 8-22 mins  Quinlin Conant A Emalee Knies 01/05/2022, 9:43 AM

## 2022-01-06 ENCOUNTER — Ambulatory Visit: Payer: 59 | Admitting: Psychiatry

## 2022-01-07 ENCOUNTER — Ambulatory Visit (INDEPENDENT_AMBULATORY_CARE_PROVIDER_SITE_OTHER): Payer: 59 | Admitting: Psychiatry

## 2022-01-07 DIAGNOSIS — F419 Anxiety disorder, unspecified: Secondary | ICD-10-CM

## 2022-01-07 NOTE — Progress Notes (Signed)
[facility] Counselor/Therapist Progress Note  Patient ID: Chelsea Benson, MRN: [id number],    Date: 01/07/2022  Time Spent: 48 minutes   Virtual Visit via Telehealth Note: Telephone only, pt not able to do video visit Connected with patient by a telemedicine/telehealth application, with their informed consent, and verified patient privacy and that I am speaking with the correct person using two identifiers. I discussed the limitations, risks, security and privacy concerns of performing psychotherapy and the availability of in person appointments. I also discussed with the patient that there may be a patient responsible charge related to this service. The patient expressed understanding and agreed to proceed. I discussed the treatment planning with the patient. The patient was provided an opportunity to ask questions and all were answered. The patient agreed with the plan and demonstrated an understanding of the instructions. The patient was advised to call  our office if  symptoms worsen or feel they are in a crisis state and need immediate contact.   Therapist Location: [facility] Patient Location: home   Treatment Type: Individual Therapy  Reported Symptoms: anxiety  Mental Status Exam:  Appearance:   N/a  telehealth      Behavior:  Sharing and Motivated  Motor:  Limited due to recent back surgery  Speech/Language:   Clear and Coherent  Affect:  N/a  telehealth  Mood:  anxious  Thought process:  goal directed  Thought content:    Some ruminating  Sensory/Perceptual disturbances:    WNL  Orientation:  oriented to person, place, time/date, situation, day of week, month of year, year, and stated date of Feb. 3, 2023  Attention:  Good  Concentration:  Good and Fair  Memory:  Some memory concerns recently that she feels is post-surgery med-related  Fund of knowledge:   Good and Fair  Insight:    Good and Fair  Judgment:   Good  Impulse Control:  Good   Risk  Assessment: Danger to Self:  No Self-injurious Behavior: No Danger to Others: No Duty to Warn:no Physical Aggression / Violence:No  Access to Firearms a concern: No  Gang Involvement:No   Subjective:   Patient today reporting anxiety after having had back surgery earlier this week.  Dealing with some pain, earlier "confusion related to medication", and frustrations with some family members making unhelpful comments. Does have other immediate family that is visiting her and helping at the home. Is glad to have the surgery "behind me." Discussed some of the family losses that have occurred more recently, and feels she is moving forward. States that having to be careful and not do much around the house and that her "worrying" is some less since surgery. Pain meds she reports are "very helpful" and she is feeling encouraged overall and knows "I got to be patient for now" adding that within 6 weeks she should be feeling gradually better.  Boundary issues with her brother continue.  Still has difficulty with "things out of my control" and did add that "while I am on this medication I do not worry as much about that".  Supported her in following through on her surgeon's recommendations and limitations.  Interventions: Solution-Oriented/Positive Psychology and Ego-Supportive  Treatment Goals:   Goals remain on tx plan as patient works on strategies to meet her goals.  Progress will be documented each visit in "Progress" section on Plan.   Long term goal: (measurable) Reduce overall level, frequency, and intensity of the anxiety so that daily functioning  is not impaired.  Patient will eventually report a rating of "4" or less" on 1-10 anxiety scale for a 63-month time period where her daily functioning is not impaired.  Short term goal: Increase understanding of beliefs and messages that produce the worry and anxiety. Strategy: Patient will explore and work to stop cognitive messages that feed anxiety and  work to replace them with more positive and empowering messages  Diagnosis:   ICD-10-CM   1. Anxiety disorder, unspecified type  F41.9      Plan: Patient today shows motivation and participated well in session as she was able.  She had wanted the session as it was after her back surgery earlier in the week and was concerned about how she might feel physically and emotionally.  Has had some pain but tolerable with the medication she has been given for it.  Relieved that surgery is over and does have the support of several people helping her, however disappointed with certain part of the family that has been less than helpful.  Appreciated the session today to be able to vent and process her anxieties in reference to the surgery and hoping she will recover okay, and more importantly some of the thoughts and feelings that she has in reference to some continued family issues with certain part of the family.  She does have other family members that are more helpful and supportive.  Encouraged patient in her practice of more positive behaviors including: Reflecting on the progress she has made often, trusting the support and care of others, for every negative thought create 2 positives, believing more in herself and recognizing her strengths versus her limitations, refraining from jumping to conclusions and assuming worst case scenarios, staying in contact with people that are supportive, having frequent contact with family members that are healthy for her and set limits with others as needed, maintain contact with a close neighbor friend who is helpful to her, get outside some on her porch as she is able, interrupt anxious/negative thoughts and try replacing with more realistic and empowering thoughts, allow for good sleep patterns, healthy nutrition, remain on her medications as prescribed, stay in the present focusing on what she can change, use more positive/encouraging self talk, and recognize the strengths  she shows working with goal-directed behaviors to move in a direction that supports improved emotional health.   Goal review and progress/challenges noted with patient.  Next appointment within 2 weeks.  This record has been created using [employer].  Chart creation errors have been sought, but may not always have been located and corrected.  Such creation errors do not reflect on the standard of medical care provided.  Shanon Ace, LCSW

## 2022-01-14 ENCOUNTER — Ambulatory Visit (INDEPENDENT_AMBULATORY_CARE_PROVIDER_SITE_OTHER): Payer: 59 | Admitting: Psychiatry

## 2022-01-14 DIAGNOSIS — F419 Anxiety disorder, unspecified: Secondary | ICD-10-CM | POA: Diagnosis not present

## 2022-01-14 NOTE — Progress Notes (Addendum)
[facility] Counselor/Therapist Progress Note  Patient ID: ALEDA CARMENATE, MRN: [id number],    Date: 01/14/2022  Time Spent: 48 minutes   Virtual Visit via Telehealth Note:  [place] with patient by a telemedicine/telehealth application, with their informed consent, and verified patient privacy and that I am speaking with the correct person using two identifiers. I discussed the limitations, risks, security and privacy concerns of performing psychotherapy and the availability of in person appointments. I also discussed with the patient that there may be a patient responsible charge related to this service. The patient expressed understanding and agreed to proceed. I discussed the treatment planning with the patient. The patient was provided an opportunity to ask questions and all were answered. The patient agreed with the plan and demonstrated an understanding of the instructions. The patient was advised to call  our office if  symptoms worsen or feel they are in a crisis state and need immediate contact.   Therapist Location: [facility] Patient Location: home   Treatment Type: Individual Therapy  Reported Symptoms: anxiety, depression (more so after surgery), impatience "wanting to recover faster")  Mental Status Exam:  Appearance:   Casual     Behavior:  Appropriate, Sharing, and Motivated  Motor:  Slowed due to back surgery, still using rollater  Speech/Language:   Clear and Coherent  Affect:  Depressed and anxiety  Mood:  anxious and depressed  Thought process:  normal  Thought content:    Rumination and overthinking  Sensory/Perceptual disturbances:    WNL  Orientation:  oriented to person, place, time/date, situation, day of week, month of year, year, and stated date of Feb. 10, 2023  Attention:  Good  Concentration:  Fair  Memory:  [place] of knowledge:   Good  Insight:    Good and Fair  Judgment:   Good  Impulse Control:  Good    Risk Assessment: Danger to Self:  No Self-injurious Behavior: No Danger to Others: No Duty to Warn:no Physical Aggression / Violence:No  Access to Firearms a concern: No  Gang Involvement:No   Subjective:  Patient today reporting anxiety, depression, and impatience. Anxiety and depression are aggravated some after her having had recent back surgery.  Impatience also more of an issue since surgery. Doing PT x3 weekly and making some progress. Does have multiple people checking in on her and someone there during the night with her. Reports pain is lessening and moving about some more with less difficulty, "I notice a little more improvement each day." Talked through her more recent increase in anxiety and depression, mostly related to her recuperation after surgery and family issues including recent deaths in past 3-4 weeks. Worked with some calming strategies to help her manage her anxiety and also trying to not "assume the worst" and decrease her tendency to excessively worry about things out of her control. Reminded her of strategies/thoughts that help her interrupt negative thought patterns.  Encouraged her to continue following up on her surgeon's recommendations and instructions following surgery during her recuperation.  Reinforced appropriate boundaries within family and with others as needed.   Interventions: Solution-Oriented/Positive Psychology and Ego-Supportive  Treatment Goals:   Goals remain on tx plan as patient works on strategies to meet her goals.  Progress will be documented each visit in "Progress" section on Plan.   Long term goal: (measurable) Reduce overall level, frequency, and intensity of the anxiety so that daily functioning is not impaired.  Patient will  eventually report a rating of "4" or less" on 1-10 anxiety scale for a 106-month time period where her daily functioning is not impaired.  Short term goal: Increase understanding of beliefs and messages that produce the  worry and anxiety. Strategy: Patient will explore and work to stop cognitive messages that feed anxiety and work to replace them with more positive and empowering messages  Diagnosis:   ICD-10-CM   1. Anxiety disorder, unspecified type  F41.9      Plan: Patient today showing real good motivation and active participation in session as she worked on her anxiety, depression, and increased impatience following recent back surgery.  Shared and processed some of her anxious/depressive thoughts and was able to get support in her confronting them and working to let go of them to move forward.  Reports feeling more confident about moving beyond surgery as she is noticing gradual improvements more recently.  Continues to have good support in her recovery from medical personnel and some of her family and friends.  States that some of the family issues continue to be a concern but she is trying not to dwell on them so much right now and focus more on her recovery from surgery, which was supported by therapist.  Was calm as we ended session and reviewed goal-directed behaviors that can help her each day as she progresses physically and emotionally. Encouraged patient in her practice of more positive behaviors including: Recognizing and reflecting more often on the progress she has made, trusting the support and care of others, for every negative thought create 2 positives, believing more in herself and recognizing her strengths versus her limitations, refraining from jumping to conclusions and assuming worst case scenarios, staying in contact with people that are supportive, having frequent contact with family members that are healthy for her and set limits with others as needed, maintain contact with a close neighbor/friend who is helpful to her, get outside some on her porch as she is able, interrupt anxious/negative thoughts and try replacing with more realistic/empowering thoughts, allow for good sleep patterns,  healthy nutrition, remain on her medications as prescribed, staying in the present focusing on what she can change, use more positive/encouraging self talk, and feel good about the strength that she shows working with goal-directed behaviors to move in a direction that supports improved emotional health and overall wellbeing.  Goal review and progress/challenges noted with patient.  Next appointment within 2 to 3 weeks.  This record has been created using [employer].  Chart creation errors have been sought, but may not always have been located and corrected.  Such creation errors do not reflect on the standard of medical care provided.  Shanon Ace, LCSW

## 2022-01-19 ENCOUNTER — Other Ambulatory Visit: Payer: Self-pay | Admitting: Allergy

## 2022-01-22 ENCOUNTER — Other Ambulatory Visit: Payer: Self-pay | Admitting: Allergy

## 2022-01-24 ENCOUNTER — Other Ambulatory Visit: Payer: Self-pay | Admitting: *Deleted

## 2022-01-24 MED ORDER — NUCALA 100 MG/ML ~~LOC~~ SOAJ: 100.0000 mg | SUBCUTANEOUS | 11 refills | Status: DC

## 2022-01-25 ENCOUNTER — Telehealth: Payer: Self-pay | Admitting: Allergy

## 2022-01-25 MED ORDER — FLUTICASONE PROPIONATE 50 MCG/ACT NA SUSP: NASAL | 1 refills | Status: DC

## 2022-01-25 NOTE — Telephone Encounter (Signed)
PATIENT CALLED AND WOULD LIKE REFILL OF FLONASE SENT TO [facility]

## 2022-01-25 NOTE — Telephone Encounter (Signed)
Sent in refill for pt of flonase to [facility] pt just seen in jan 2023

## 2022-01-27 ENCOUNTER — Ambulatory Visit (INDEPENDENT_AMBULATORY_CARE_PROVIDER_SITE_OTHER): Payer: 59 | Admitting: Psychiatry

## 2022-01-27 DIAGNOSIS — F419 Anxiety disorder, unspecified: Secondary | ICD-10-CM

## 2022-01-27 NOTE — Progress Notes (Signed)
[facility] Counselor/Therapist Progress Note  Patient ID: Chelsea Benson, MRN: [id number],    Date: 01/27/2022  Time Spent: 48 minutes   Virtual Visit via Telehealth Note: [place] with patient by a telemedicine/telehealth application, with their informed consent, and verified patient privacy and that I am speaking with the correct person using two identifiers. I discussed the limitations, risks, security and privacy concerns of performing psychotherapy and the availability of in person appointments. I also discussed with the patient that there may be a patient responsible charge related to this service. The patient expressed understanding and agreed to proceed. I discussed the treatment planning with the patient. The patient was provided an opportunity to ask questions and all were answered. The patient agreed with the plan and demonstrated an understanding of the instructions. The patient was advised to call  our office if  symptoms worsen or feel they are in a crisis state and need immediate contact.   Therapist Location: home office Patient Location: home   Treatment Type: Individual Therapy  Reported Symptoms: anxiety "but getting some better, I want the stitches out of my back but it is healing." No depression.  Mental Status Exam:  Appearance:   N/a  telehealth      Behavior:  Sharing  Motor:  Affected by recent back surgery but "doing better, I just have to take my time."  Speech/Language:   Clear and Coherent  Affect:  N/a   telehealth  Mood:  anxious (improving)  Thought process:  goal directed  Thought content:    WNL  Sensory/Perceptual disturbances:    WNL  Orientation:  oriented to person, place, time/date, situation, day of week, month of year, year, and stated date of Feb. 23, 2023  Attention:  Good  Concentration:  Good  Memory:  WNL  Fund of knowledge:   Good  Insight:    Good and Fair  Judgment:   Good  Impulse Control:  Good    Risk Assessment: Danger to Self:  No Self-injurious Behavior: No Danger to Others: No Duty to Warn:no Physical Aggression / Violence:No  Access to Firearms a concern: No  Gang Involvement:No   Subjective:  Patient today reporting anxiety but states her anxiety is decreasing some, and "not having any depression." Focusing on recovering from recent back surgery after having completed PT at her home. To "get my stitches out March 2nd and that makes me anxious because it might be too early." But Dr feels it is the time and patient trying to trust that and does "notice it's healing."  Reports staying on her meds, eating healthy, and moving about inside her home. Has family and friend checking in on her. Sleeping ok. Admits the worst part right now is "being patient with her healing." Is being "careful not to over-do it and not doing any chores etc."States she has everything she needs. Discussed her continued management of the anxiety she does have and also admits being held up inside may have "protected me from being anxious".  Encouraged her to stay in the present and feel good about her progress physically and emotionally.   Interventions: Solution-Oriented/Positive Psychology and Ego-Supportive  Treatment Goals:   Goals remain on tx plan as patient works on strategies to meet her goals.  Progress will be documented each visit in "Progress" section on Plan.   Long term goal: (measurable) Reduce overall level, frequency, and intensity of the anxiety so that daily functioning is not impaired.  Patient will eventually report a rating of "4" or less" on 1-10 anxiety scale for a 12-month time period where her daily functioning is not impaired.  Short term goal: Increase understanding of beliefs and messages that produce the worry and anxiety. Strategy: Patient will explore and work to stop cognitive messages that feed anxiety and work to replace them with more positive and empowering  messages  Diagnosis:   ICD-10-CM   1. Anxiety disorder, unspecified type  F41.9      Plan:  Patient showing motivation and good participation in session today. Reporting some decrease in her anxiety and "just trying to be patient while she heals from recent back surgery. Cited several ways that her progress can be seen and she is hopeful of keeping "my progress going." Still some health-related anxiety and anxiety about "all the troubles in the world" and she has been encouraged to watch/listen to less to disturbing news on TV/online. Is staying in touch with family and friends. Encouraged staying on her meds as prescribed and continue goal-directed behaviors that support improvement in her emotional health.   Goal review and progress/challenges noted with patient.  Next appt within 2-3 weeks.  This record has been created using [employer].  Chart creation errors have been sought, but may not always have been located and corrected.  Such creation errors do not reflect on the standard of medical care provided.   Shanon Ace, LCSW

## 2022-01-31 ENCOUNTER — Other Ambulatory Visit: Payer: Self-pay

## 2022-01-31 ENCOUNTER — Other Ambulatory Visit: Payer: Self-pay | Admitting: Psychiatry

## 2022-01-31 ENCOUNTER — Telehealth: Payer: Self-pay | Admitting: Psychiatry

## 2022-01-31 DIAGNOSIS — F410 Panic disorder [episodic paroxysmal anxiety] without agoraphobia: Secondary | ICD-10-CM

## 2022-01-31 DIAGNOSIS — F5101 Primary insomnia: Secondary | ICD-10-CM

## 2022-01-31 MED ORDER — CARBINOXAMINE MALEATE 4 MG PO TABS: 1.0000 | ORAL_TABLET | Freq: Two times a day (BID) | ORAL | 1 refills | Status: DC

## 2022-01-31 NOTE — Telephone Encounter (Signed)
Called [facility] and a PA is not required for clonazepam. Patient notified.

## 2022-01-31 NOTE — Telephone Encounter (Signed)
Pt LVM stating that [facility] told her that a PA had been started for her med and stopped.  (I think she said the Clonazepam).  She wants to know why it was stopped.  Next appt 4/18

## 2022-02-01 ENCOUNTER — Telehealth: Payer: Self-pay | Admitting: Psychiatry

## 2022-02-01 NOTE — Telephone Encounter (Signed)
Ok since she has been ok these long term without an issue. Klonopin is as needed.

## 2022-02-01 NOTE — Telephone Encounter (Signed)
Please see message and advise 

## 2022-02-01 NOTE — Telephone Encounter (Signed)
Returned call to [facility], ok to take both medications, though we do not prescribe the pain meds.

## 2022-02-01 NOTE — Telephone Encounter (Signed)
Chelsea Benson at [facility] called to say that pt has a script for the Clonazapam but is also on Hydrocodone.  Is it ok for the pt to be on a benzo and an opioid as it it increases the likely hood of opioid overdose.  Pls call the pharmacy back at [phone] to confirm if it is ok with Chelsea Benson.  Ref # [id number]  Next appt 4/18

## 2022-02-04 ENCOUNTER — Ambulatory Visit (INDEPENDENT_AMBULATORY_CARE_PROVIDER_SITE_OTHER): Payer: 59 | Admitting: Psychiatry

## 2022-02-04 DIAGNOSIS — F419 Anxiety disorder, unspecified: Secondary | ICD-10-CM | POA: Diagnosis not present

## 2022-02-04 NOTE — Progress Notes (Signed)
?    [facility] Counselor/Therapist Progress Note ? ?Patient ID: Chelsea Benson, MRN: [id number],   ? ?Date: 02/04/2022 ? ?Time Spent: 48 minutes   ? ?Virtual Visit via Telehealth Note:  VIDEO session ?Connected with patient by a telemedicine/telehealth application, with their informed consent, and verified patient privacy and that I am speaking with the correct person using two identifiers. I discussed the limitations, risks, security and privacy concerns of performing psychotherapy and the availability of in person appointments. I also discussed with the patient that there may be a patient responsible charge related to this service. The patient expressed understanding and agreed to proceed. ?I discussed the treatment planning with the patient. The patient was provided an opportunity to ask questions and all were answered. The patient agreed with the plan and demonstrated an understanding of the instructions. The patient was advised to call  our office if  symptoms worsen or feel they are in a crisis state and need immediate contact. ?  ?Therapist Location: office ?Patient Location: home ? ?Treatment Type: Individual Therapy ? ?Reported Symptoms: anxious, frustrations ? ?Mental Status Exam: ? ?Appearance:   Casual     ?Behavior:  Appropriate, Sharing, and Motivated  ?Motor:  Using rollator when walking  ?Speech/Language:   Clear and Coherent  ?Affect:  anxious  ?Mood:  anxious  ?Thought process:  goal directed  ?Thought content:    overthinking  ?Sensory/Perceptual disturbances:    WNL  ?Orientation:  oriented to person, place, time/date, situation, day of week, month of year, year, and stated date of February 04, 2022  ?Attention:  Good  ?Concentration:  Good  ?Memory:  WNL  ?Fund of knowledge:   Good  ?Insight:    Good and Fair  ?Judgment:   Good  ?Impulse Control:  Fair  ? ?Risk Assessment: ?Danger to Self:  No ?Self-injurious Behavior: No ?Danger to Others: No ?Duty to Warn:no ?Physical Aggression / Violence:No   ?Access to Firearms a concern: No  ?Gang Involvement:No  ? ?Subjective:  Patient today reporting anxiety, frustration with insurance "not knowing their jobs" and "I'm channeling all my frustration toward this right now." Encouraged patient to vent her frustration which she freely did and this seemed to help patient to feel heard and validated, and she was able to calm herself and actually focus on her positiveness and gratitude for her recent back surgery going as well as it did. Didn't get stitches out as planned yesterday but will get them out April 13th.  Did add later that they got her medication straightened out and has picked it up at pharmacy. Feeling positive about her recovery and how she "won't have to deal with so much pain."Sleeping good now , sometimes wake up during the night to go to bathroom but then able to go back to sleep. States she is eating healthy, has friends checking on her. Did encourage her not to "over do-it once she gets stitches out" as she is anxious to get some cleaning done at her home and she also shared she'll get family to help. ? ?Interventions: Solution-Oriented/Positive Psychology ? ?Treatment Goals:  ? Goals remain on tx plan as patient works on strategies to meet her goals.  Progress will be documented each visit in "Progress" section on Plan.   ?Long term goal: (measurable) ?Reduce overall level, frequency, and intensity of the anxiety so that daily functioning is not impaired.  Patient will eventually report a rating of "4" or less" on 1-10 anxiety scale for a 34-month  time period where her daily functioning is not impaired.  ?Short term goal: ?Increase understanding of beliefs and messages that produce the worry and anxiety. ?Strategy: ?Patient will explore and work to stop cognitive messages that feed anxiety and work to replace them with more positive and empowering messages ? ?Diagnosis: ?  ICD-10-CM   ?1. Anxiety disorder, unspecified type  F41.9   ?  ? ?Plan:  Patient  showing good motivation and engagement in session today as she continues to work on decreasing and better managing her anxiety as well as trying to have more patience with herself and with her healing process after recent back surgery.  Some frustration and irritability regarding a contact with her insurance but things got straightened out and she got her medication.  She was actually able to refocus some during the session after venting considerable anger, she refocused on some positives in regards to how well she is doing since her back surgery.  She is anxious to get her stitches out and that got postponed to April 13.  She is looking forward to that and states that she wants to be able to clean her house some.  I encouraged her to pace herself and not go too fast too quick and she shared that her family is that she will be helping her with any housecleaning. Encouraged patient in staying on her meds as prescribed, lessen the amount of TV or online exposure to disturbing news, maintain contact with people who are supportive of her, recognize and reflect more often on the progress she has made, trust the support and care of others, for every negative thought create 2 positives, believe more in herself and recognize her strengths versus her limitations, refraining from jumping to conclusions and assuming worst case scenarios, have frequent contact with family members that are healthy for her and set limits with others that stress her, maintain contact with a close neighbor/friend who is very helpful to her, get outside some on her porch as she is able to do safely as she loves being on her porch, interrupt anxious/negative thoughts and try replacing with more realistic/empowering thoughts as she has made progress with this recently, allow for good sleep patterns, healthy nutrition, stay in the present focusing on what she can control, use more positive/encouraging self talk, and recognize the strength she shows  working with goal-directed behaviors to move in a direction that supports overall wellbeing and her improved emotional health. ? ?Goal review and progress/challenges noted with patient. ? ?Next appointment within 2 to 3 weeks. ? ?This record has been created using [employer].  Chart creation errors have been sought, but may not always have been located and corrected.  Such creation errors do not reflect on the standard of medical care provided. ? ? ?Mathis Fare, LCSW ? ? ? ? ? ? ? ? ? ? ? ? ? ? ? ? ? ? ?

## 2022-02-12 ENCOUNTER — Other Ambulatory Visit: Payer: Self-pay | Admitting: Cardiology

## 2022-02-14 ENCOUNTER — Ambulatory Visit (INDEPENDENT_AMBULATORY_CARE_PROVIDER_SITE_OTHER): Payer: 59 | Admitting: Psychiatry

## 2022-02-14 DIAGNOSIS — F419 Anxiety disorder, unspecified: Secondary | ICD-10-CM | POA: Diagnosis not present

## 2022-02-14 NOTE — Progress Notes (Signed)
?    [facility] Counselor/Therapist Progress Note ? ?Patient ID: Chelsea Benson, MRN: [id number],   ? ?Date: 02/14/2022 ? ?Time Spent: 50 minutes  ? ?Virtual Visit via Telehealth Note: MyChart VIDEO Session ?Connected with patient by a telemedicine/telehealth application, with their informed consent, and verified patient privacy and that I am speaking with the correct person using two identifiers. I discussed the limitations, risks, security and privacy concerns of performing psychotherapy and the availability of in person appointments. I also discussed with the patient that there may be a patient responsible charge related to this service. The patient expressed understanding and agreed to proceed. ?I discussed the treatment planning with the patient. The patient was provided an opportunity to ask questions and all were answered. The patient agreed with the plan and demonstrated an understanding of the instructions. The patient was advised to call  our office if  symptoms worsen or feel they are in a crisis state and need immediate contact. ?  ?Therapist Location: office ?Patient Location: home ?  ?Treatment Type: Individual Therapy ? ?Reported Symptoms: anxiety, stressed ? ?Mental Status Exam: ? ?Appearance:   Casual     ?Behavior:  Appropriate, Sharing, and Motivated  ?Motor:  Normal  ?Speech/Language:   Clear and Coherent  ?Affect:  Anxious, stressed  ?Mood:  anxious  ?Thought process:  goal directed  ?Thought content:    Some overthinking and rumination  ?Sensory/Perceptual disturbances:    WNL  ?Orientation:  oriented to person, place, time/date, situation, day of week, month of year, year, and stated date of February 14, 2022  ?Attention:  Good  ?Concentration:  Good  ?Memory:  WNL  ?Fund of knowledge:   Good  ?Insight:    Good and Fair  ?Judgment:   Good  ?Impulse Control:  Good  ? ?Risk Assessment: ?Danger to Self:  No ?Self-injurious Behavior: No ?Danger to Others: No ?Duty to Warn:no ?Physical Aggression  / Violence:No  ?Access to Firearms a concern: No  ?Gang Involvement:No  ? ?Subjective:  Patient today reporting anxiety and feeling stressed. Feeling stressed over "not having my new rollator but it's in the works now as I had to go over the Dr's nurse and get someone else to take care of this. Frustrated about having to wear her backbrace and not sure how much longer that will be.  Discussed what helps her to have more patience with her healing. Is making progress and able to point out her progress. Sleeping well. Family still checking in on her.  Depression has not been as bad lately and we focused more on her anxiety today, reviewing strategies for managing anxiety as well as reframing the way she looks at certain situations.  He is feeling positive about her overall recovery from surgery although as noted above is having some difficulty being patient and we discussed this today also.  Is eating healthy and has good support from others. ? ?Interventions: Solution-Oriented/Positive Psychology, Ego-Supportive, and Insight-Oriented ? ?Treatment Goals:  ? Goals remain on tx plan as patient works on strategies to meet her goals.  Progress will be documented each visit in "Progress" section on Plan.   ?Long term goal: (measurable) ?Reduce overall level, frequency, and intensity of the anxiety so that daily functioning is not impaired.  Patient will eventually report a rating of "4" or less" on 1-10 anxiety scale for a 18-month time period where her daily functioning is not impaired.  ?Short term goal: ?Increase understanding of beliefs and messages that produce the  worry and anxiety. ?Strategy: ?Patient will explore and work to stop cognitive messages that feed anxiety and work to replace them with more positive and empowering messages ?  ? ?Diagnosis: ?  ICD-10-CM   ?1. Anxiety disorder, unspecified type  F41.9   ?  ? ?Plan:  Patient showing good motivation and participation in session today as she worked further on  decreasing and better managing her anxiety as well as having more patience with herself and others as she continues her healing process after having recent back surgery.  Having some depression but reports "not much in the last few days".  Anger has definitely lessened since last appointment.  Still encouraged her to pace herself and careful moving about, closely following her surgeon's directions. ?Encouraged patient to remain on her meds as prescribed, maintain contact with people who are supportive of her, lessen the amount of TV or online exposure to disturbing news, recognize and reflect more often on the progress she has made, trust the support and care of others, for every negative thought create 2 positives, believe more in herself and recognize her strengths versus her limitations, refraining from jumping to conclusions and assuming worst case scenarios, have frequent contact with family members that are healthy for her and set limits with others that stressor, maintain contact with a close neighbor/friend who is very helpful to her, get outside on her porch as she is able to do so safely, interrupt anxious/negative thoughts and try replacing with more realistic/empowering thoughts as she has made progress with this recently, allow for good sleep patterns, healthy nutrition, staying in the present focusing on what she can control, use more positive/encouraging self talk, and realize the strength she shows working with goal-directed behaviors aimed at supporting her improved emotional health and wellbeing. ? ?Goal review and progress/challenges noted with patient. ? ?Next appointment within 2 to 3 weeks. ? ?This record has been created using [employer].  Chart creation errors have been sought, but may not always have been located and corrected.  Such creation errors do not reflect on the standard of medical care provided. ? ? ?Shanon Ace, LCSW ? ? ? ? ? ? ? ? ? ? ? ? ? ? ? ? ? ? ?

## 2022-02-27 ENCOUNTER — Other Ambulatory Visit: Payer: Self-pay | Admitting: Allergy

## 2022-03-01 ENCOUNTER — Ambulatory Visit (INDEPENDENT_AMBULATORY_CARE_PROVIDER_SITE_OTHER): Payer: 59 | Admitting: Psychiatry

## 2022-03-01 DIAGNOSIS — F419 Anxiety disorder, unspecified: Secondary | ICD-10-CM | POA: Diagnosis not present
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
## 2022-03-04 ENCOUNTER — Other Ambulatory Visit: Payer: Self-pay | Admitting: Psychiatry

## 2022-03-04 DIAGNOSIS — F419 Anxiety disorder, unspecified: Secondary | ICD-10-CM

## 2022-03-07 ENCOUNTER — Other Ambulatory Visit: Payer: Self-pay | Admitting: Cardiology

## 2022-03-08 NOTE — Telephone Encounter (Signed)
?*  STAT* If patient is at the pharmacy, call can be transferred to refill team. ? ? ?1. Which medications need to be refilled? (please list name of each medication and dose if known) diltiazem (CARDIZEM) 60 MG tablet ? ?2. Which pharmacy/location (including street and city if local pharmacy) is medication to be sent to? [facility] [phone] - [place], [place] - [address] AT [facility] OF [address] ? ?3. Do they need a 30 day or 90 day supply? 90 day  ?

## 2022-03-15 ENCOUNTER — Ambulatory Visit (INDEPENDENT_AMBULATORY_CARE_PROVIDER_SITE_OTHER): Payer: 59 | Admitting: Psychiatry

## 2022-03-15 DIAGNOSIS — F419 Anxiety disorder, unspecified: Secondary | ICD-10-CM
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
## 2022-03-22 ENCOUNTER — Encounter: Payer: Self-pay | Admitting: Psychiatry

## 2022-03-22 ENCOUNTER — Telehealth (INDEPENDENT_AMBULATORY_CARE_PROVIDER_SITE_OTHER): Payer: 59 | Admitting: Psychiatry

## 2022-03-22 DIAGNOSIS — F5101 Primary insomnia: Secondary | ICD-10-CM | POA: Diagnosis not present

## 2022-03-22 DIAGNOSIS — F3181 Bipolar II disorder: Secondary | ICD-10-CM | POA: Diagnosis not present

## 2022-03-22 DIAGNOSIS — F419 Anxiety disorder, unspecified: Secondary | ICD-10-CM | POA: Diagnosis not present

## 2022-03-22 DIAGNOSIS — F410 Panic disorder [episodic paroxysmal anxiety] without agoraphobia: Secondary | ICD-10-CM | POA: Diagnosis not present

## 2022-03-22 MED ORDER — OXCARBAZEPINE 300 MG PO TABS: 600.0000 mg | ORAL_TABLET | Freq: Every day | ORAL | 1 refills | Status: DC

## 2022-03-22 MED ORDER — BUSPIRONE HCL 30 MG PO TABS: 30.0000 mg | ORAL_TABLET | Freq: Two times a day (BID) | ORAL | 2 refills | Status: DC

## 2022-03-22 MED ORDER — MIRTAZAPINE 30 MG PO TABS: ORAL_TABLET | ORAL | 3 refills | Status: DC

## 2022-03-22 MED ORDER — CLONAZEPAM 0.5 MG PO TABS: ORAL_TABLET | ORAL | 0 refills | Status: DC

## 2022-03-22 MED ORDER — DAYVIGO 10 MG PO TABS: 1.0000 | ORAL_TABLET | Freq: Every day | ORAL | 1 refills | Status: DC

## 2022-03-22 MED ORDER — ARIPIPRAZOLE 2 MG PO TABS: 4.0000 mg | ORAL_TABLET | Freq: Every day | ORAL | 1 refills | Status: DC
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
## 2022-03-23 ENCOUNTER — Other Ambulatory Visit: Payer: Self-pay | Admitting: Allergy

## 2022-03-25 ENCOUNTER — Other Ambulatory Visit: Payer: Self-pay | Admitting: Allergy

## 2022-03-29 ENCOUNTER — Ambulatory Visit (INDEPENDENT_AMBULATORY_CARE_PROVIDER_SITE_OTHER): Payer: 59 | Admitting: Psychiatry

## 2022-03-29 DIAGNOSIS — F419 Anxiety disorder, unspecified: Secondary | ICD-10-CM | POA: Diagnosis not present

## 2022-03-29 NOTE — Progress Notes (Signed)
?    [facility] Counselor/Therapist Progress Note ? ?Patient ID: Chelsea Benson, MRN: [id number],   ? ?Date: 03/29/2022 ? ?Time Spent: 50 minutes  ? ?Virtual Visit via Telehealth Note: MyChart VIDEO session ?Connected with patient by a telemedicine/telehealth application, with their informed consent, and verified patient privacy and that I am speaking with the correct person using two identifiers. I discussed the limitations, risks, security and privacy concerns of performing psychotherapy and the availability of in person appointments. I also discussed with the patient that there may be a patient responsible charge related to this service. The patient expressed understanding and agreed to proceed. ?I discussed the treatment planning with the patient. The patient was provided an opportunity to ask questions and all were answered. The patient agreed with the plan and demonstrated an understanding of the instructions. The patient was advised to call  our office if  symptoms worsen or feel they are in a crisis state and need immediate contact. ?  ?Therapist Location: office ?Patient Location: home ?  ? ?Treatment Type: Individual Therapy ? ?Reported Symptoms: anxiety, no depression ? ?Mental Status Exam: ? ?Appearance:   Casual     ?Behavior:  Appropriate, Sharing, and Motivated  ?Motor:  Some back pain (post surgery)  ?Speech/Language:   Clear and Coherent  ?Affect:  anxious  ?Mood:  anxious  ?Thought process:  goal directed  ?Thought content:    Some ruminating  ?Sensory/Perceptual disturbances:    WNL  ?Orientation:  oriented to person, place, time/date, situation, day of week, month of year, year, and stated date of March 29, 2022  ?Attention:  Good  ?Concentration:  Good  ?Memory:  WNL  ?Fund of knowledge:   Good  ?Insight:    Good and Fair  ?Judgment:   Good  ?Impulse Control:  Good  ? ?Risk Assessment: ?Danger to Self:  No ?Self-injurious Behavior: No ?Danger to Others: No ?Duty to Warn:no ?Physical  Aggression / Violence:No  ?Access to Firearms a concern: No  ?Gang Involvement:No  ? ?Subjective:  Patient reports "not being so stressed out, not having any depression, but having anxiety."  Anxiety mostly related to her back issues and some related concerns with brother living with her based on prior experience. Wanting to see friend in [place] but not up for it yet til her back gets stronger. Looking some at what she can do to help relieve her anxiety, especially in trying to not assume worse case scenarios, getting extra rest as needed, setting boundaries as needed with others,  staying on her meds as prescribed, and remaining in contact with supportive friends. Brother not creating any problems at home since last contact with patient, and she's feeling positive about that.Staying in contact with family members and friends that are supportive  is helping. ? ?Interventions: Solution-Oriented/Positive Psychology, Ego-Supportive, and Insight-Oriented ? ?Treatment Goals:  ? Goals remain on tx plan as patient works on strategies to meet her goals.  Progress will be documented each visit in "Progress" section on Plan.   ?Long term goal: (measurable) ?Reduce overall level, frequency, and intensity of the anxiety so that daily functioning is not impaired.  Patient will eventually report a rating of "4" or less" on 1-10 anxiety scale for a 27-month time period where her daily functioning is not impaired.  ?Short term goal: ?Increase understanding of beliefs and messages that produce the worry and anxiety. ?Strategy: ?Patient will explore and work to stop cognitive messages that feed anxiety and work to replace them with  more positive and empowering messages ? ?Diagnosis: ?  ICD-10-CM   ?1. Anxiety disorder, unspecified type  F41.9   ?  ? ?Plan: Patient today working on anxiety management especially related to her back pain following recent surgery and some issues/stressors within the family.  Is trying to set some  healthy boundaries and also making her own healthcare a priority as she is hoping to heal more from her back surgery and be able to visit a friend in [place].  Good motivation and active participation in session today.  Grateful that her brother has not created any more safety issues the last few weeks and is feeling hopeful about that.  Expresses difficulty being patient with her back healing as she is eager to get out more with the better weather.  Encouraged her being able to get out as she is able on her front porch which she typically enjoys and in seeing her neighbors that live close by.  Continues to feel positive about not feeling depressed at this point. Encouraged patient in her practice of more positive behaviors including: Maintaining contact with people who are supportive, remain on her medications as prescribed, lessen her exposure to disturbing news whether it is on TV or online, believing more in herself and recognize her strengths versus limitations, recognize and reflect on her progress daily, trust the support and care of others, refraining from jumping to conclusions and assuming worst case scenarios, have frequent contact with family members that are healthy for her and set limits with others, maintain contact with a close neighbor friend who is very helpful to her, get outside on her porch as she is able to do so safely as that is a place she enjoys, interrupt anxious/negative thoughts to replace them with more realistic/empowering thoughts, allow for better sleep patterns, healthy nutrition, stay in the present focusing on what she can control, positive self talk, and recognize the strength she shows working with goal-directed behaviors aimed at improving her emotional health. ? ?Goal review and progress/challenges noted with patient. ? ?Next appointment within 3-4 weeks. ? ?This record has been created using [employer].  Chart creation errors have been sought, but may not always have  been located and corrected.  Such creation errors do not reflect on the standard of medical care provided. ? ? ?Shanon Ace, LCSW ? ? ? ? ? ? ? ? ? ? ? ? ? ? ? ? ? ? ?

## 2022-04-19 ENCOUNTER — Other Ambulatory Visit: Payer: Self-pay | Admitting: Cardiology

## 2022-04-19 DIAGNOSIS — I480 Paroxysmal atrial fibrillation: Secondary | ICD-10-CM

## 2022-04-19 MED ORDER — APIXABAN 5 MG PO TABS: 5.0000 mg | ORAL_TABLET | Freq: Two times a day (BID) | ORAL | 1 refills | Status: DC

## 2022-04-19 NOTE — Telephone Encounter (Signed)
°*  STAT* If patient is at the pharmacy, call can be transferred to refill team. ° ° °1. Which medications need to be refilled? (please list name of each medication and dose if known) Eliquis ° °2. Which pharmacy/location (including street and city if local pharmacy) is medication to be sent to? [facility] ° °3. Do they need a 30 day or 90 day supply? 90 days and refills ° °

## 2022-04-19 NOTE — Telephone Encounter (Signed)
Prescription refill request for Eliquis received. ?Indication:Afib  ?Last office visit:09/29/21 Chelsea Benson)  ?Scr: 0.82 (12/23/21) ?Age: 71 ?Weight: 75.3kg ? ?Appropriate dose and refill sent to requested pharmacy.  ?

## 2022-04-21 ENCOUNTER — Encounter: Payer: Self-pay | Admitting: Allergy

## 2022-04-21 ENCOUNTER — Ambulatory Visit (INDEPENDENT_AMBULATORY_CARE_PROVIDER_SITE_OTHER): Payer: Medicare Other | Admitting: Allergy

## 2022-04-21 VITALS — BP 130/76 | HR 88 | Temp 98.0°F | Resp 20 | Ht 64.0 in | Wt 168.0 lb

## 2022-04-21 DIAGNOSIS — H1013 Acute atopic conjunctivitis, bilateral: Secondary | ICD-10-CM

## 2022-04-21 DIAGNOSIS — T7800XD Anaphylactic reaction due to unspecified food, subsequent encounter: Secondary | ICD-10-CM

## 2022-04-21 DIAGNOSIS — T7805XD Anaphylactic reaction due to tree nuts and seeds, subsequent encounter: Secondary | ICD-10-CM

## 2022-04-21 DIAGNOSIS — J4541 Moderate persistent asthma with (acute) exacerbation: Secondary | ICD-10-CM | POA: Diagnosis not present

## 2022-04-21 DIAGNOSIS — T380X5D Adverse effect of glucocorticoids and synthetic analogues, subsequent encounter: Secondary | ICD-10-CM | POA: Diagnosis not present

## 2022-04-21 DIAGNOSIS — L2089 Other atopic dermatitis: Secondary | ICD-10-CM

## 2022-04-21 DIAGNOSIS — T50905D Adverse effect of unspecified drugs, medicaments and biological substances, subsequent encounter: Secondary | ICD-10-CM

## 2022-04-21 DIAGNOSIS — J3089 Other allergic rhinitis: Secondary | ICD-10-CM | POA: Diagnosis not present

## 2022-04-21 DIAGNOSIS — J4540 Moderate persistent asthma, uncomplicated: Secondary | ICD-10-CM

## 2022-04-21 MED ORDER — AZELASTINE HCL 0.1 % NA SOLN: 2.0000 | Freq: Two times a day (BID) | NASAL | 5 refills | Status: DC

## 2022-04-21 MED ORDER — BEPOTASTINE BESILATE 1.5 % OP SOLN: 1.0000 [drp] | Freq: Two times a day (BID) | OPHTHALMIC | 5 refills | Status: DC | PRN

## 2022-04-21 MED ORDER — AMOXICILLIN-POT CLAVULANATE 875-125 MG PO TABS: 1.0000 | ORAL_TABLET | Freq: Two times a day (BID) | ORAL | 0 refills | Status: AC

## 2022-04-21 NOTE — Progress Notes (Signed)
Follow-up Note  RE: Chelsea Benson MRN: [id number] DOB: 09-Jan-1951 Date of Office Visit: 04/21/2022   History of present illness: Chelsea Benson is a 71 y.o. female presenting today for follow-up of allergic rhinitis with conjunctivitis, asthma, food allergy, eczema and medication allergy.  She was last seen as a telemetry visit on 12/17/2021 by myself.  She states none of her medications are working for her allergy symptoms.  She stays in the house most of the time thus she feels like she is not getting a lot of pollen exposure. She states the carbinaxmine 1m 1 tab twice a day is not working as she still has to take benadryl on top of it.   She states her congestion and drainage is worse and she is coughing more.  The drainage is light yellow.  Her eyes feel like they have rockes in them as she is out of eye drop, bepreve.  Of her medications do 1 thing that does help is the eyedrop.  The nasal Atrovent at this point is not helping with drainage control.  She does continue to take Singulair daily.  She also reports having sinus pressure and headache and she had right ear pain last week as well.  She is prone to sinus infections.   Her asthma has been relatively stable despite the above symptoms.  She continues to take Symbicort 2 puffs twice a day, Singulair daily and Nucala monthly.  Her daughter gives her her injections monthly without any issues. She continues to avoid almonds and has access to an epinephrine device. She states her skin has been doing well without any rash or flares and she has not needed to use triamcinolone. She continues to avoid Depo-Medrol.  Review of systems: Review of Systems  Constitutional: Negative.   HENT:         See HPI  Eyes:        See HPI  Respiratory: Negative.    Cardiovascular: Negative.   Gastrointestinal: Negative.   Musculoskeletal: Negative.   Skin: Negative.   Allergic/Immunologic: Negative.   Neurological: Negative.     All other  systems negative unless noted above in HPI  Past medical/social/surgical/family history have been reviewed and are unchanged unless specifically indicated below.  No changes  Medication List: Current Outpatient Medications  Medication Sig Dispense Refill   ACCU-CHEK GUIDE test strip TEST BID PRN  1   albuterol (PROVENTIL) (2.5 MG/3ML) 0.083% nebulizer solution USE 1 VIAL IN NEBULIZER EVERY 4 HOURS - and as needed 75 mL 0   albuterol (VENTOLIN HFA) 108 (90 Base) MCG/ACT inhaler USE 2 INHALATIONS BY MOUTH EVERY 4 HOURS AS NEEDED FOR WHEEZING  OR SHORTNESS OF BREATH 18 g 1   amoxicillin-clavulanate (AUGMENTIN) 875-125 MG tablet Take 1 tablet by mouth 2 (two) times daily for 10 days. 20 tablet 0   apixaban (ELIQUIS) 5 MG TABS tablet Take 1 tablet (5 mg total) by mouth 2 (two) times daily. 180 tablet 1   ARIPiprazole (ABILIFY) 2 MG tablet Take 2 tablets (4 mg total) by mouth daily. 180 tablet 1   azelastine (ASTELIN) 0.1 % nasal spray Place 2 sprays into both nostrils 2 (two) times daily. Use in each nostril as directed 30 mL 5   Blood Glucose Monitoring Suppl (ACCU-CHEK GUIDE) w/Device KIT See admin instructions.  1   budesonide-formoterol (SYMBICORT) 160-4.5 MCG/ACT inhaler INHALE 2 INHALATIONS BY MOUTH  INTO THE LUNGS IN THE MORNING  AND AT BEDTIME 10.2 g 11  busPIRone (BUSPAR) 30 MG tablet Take 1 tablet (30 mg total) by mouth 2 (two) times daily. 180 tablet 2   CALCIUM-VITAMIN D PO Take 1 tablet by mouth daily.     Carbinoxamine Maleate 4 MG TABS Take 1 tablet (4 mg total) by mouth in the morning and at bedtime. 180 tablet 1   CLOBETASOL PROPIONATE E 0.05 % emollient cream Apply 1 application topically 2 (two) times daily as needed (rash).      clonazePAM (KLONOPIN) 0.5 MG tablet Take 1/2-1 tab po TID prn anxiety. (Do not combine with Ambien) 90 tablet 0   desonide (DESOWEN) 0.05 % ointment APPLY TO AFFECTED AREA(S)  TOPICALLY TWICE DAILY AS  NEEDED 180 g 1   diltiazem (CARDIZEM CD) 300 MG 24  hr capsule TAKE 1 CAPSULE BY MOUTH DAILY 90 capsule 3   diltiazem (CARDIZEM) 60 MG tablet TAKE DILITAZEM 60 MG BY MOUTH AS NEEDED FOR PALPITATIONS. YOU CAN TAKE EVERY 6 HOURS AS NEEDED UP TO TWICE DAILY WITH EACH EPISODE 60 tablet 7   EPINEPHrine 0.3 mg/0.3 mL IJ SOAJ injection INJECT INTRAMUSCULARLY 1  PEN AS NEEDED FOR ALLERGIC  RESPONSE AS DIRECTED BY MD. SEEK MEDICAL HELP AFTER  USE. 2 each 0   famotidine (PEPCID) 20 MG tablet TAKE 1 TABLET BY MOUTH DAILY  AFTER SUPPER 90 tablet 1   fluticasone (FLONASE) 50 MCG/ACT nasal spray USE 2 SPRAYS IN BOTH NOSTRILS  DAILY 48 g 1   hydrochlorothiazide (HYDRODIURIL) 25 MG tablet TAKE 1 TABLET(25 MG) BY MOUTH DAILY 90 tablet 3   HYDROcodone-acetaminophen (NORCO/VICODIN) 5-325 MG tablet Take 1-2 tablets by mouth every 4 (four) hours as needed for moderate pain ((score 7 to 10)). 30 tablet 0   Lemborexant (DAYVIGO) 10 MG TABS Take 1 tablet by mouth at bedtime. 90 tablet 1   meloxicam (MOBIC) 7.5 MG tablet Take 1 tablet (7.5 mg total) by mouth daily. 30 tablet 0   Mepolizumab (NUCALA) 100 MG/ML SOAJ Inject 1 mL (100 mg total) into the skin every 28 (twenty-eight) days. 1 mL 11   metFORMIN (GLUCOPHAGE-XR) 500 MG 24 hr tablet Take 500 mg by mouth in the morning and at bedtime.     mirtazapine (REMERON) 30 MG tablet TAKE 1 TABLET BY MOUTH AT  BEDTIME 90 tablet 3   montelukast (SINGULAIR) 10 MG tablet Take 1 tablet (10 mg total) by mouth at bedtime. 30 tablet 5   Oxcarbazepine (TRILEPTAL) 300 MG tablet Take 2 tablets (600 mg total) by mouth at bedtime. 180 tablet 1   oxybutynin (DITROPAN-XL) 10 MG 24 hr tablet Take 10 mg by mouth daily.     pantoprazole (PROTONIX) 40 MG tablet TAKE 1 TABLET BY MOUTH  DAILY. TAKE 30 TO 60  MINUTES BEFORE FIRST MEAL  OF THE DAY 90 tablet 3   Polyethyl Glycol-Propyl Glycol (SYSTANE OP) Place 1 drop into both eyes 2 (two) times daily as needed (dry eyes).     potassium chloride SA (KLOR-CON) 20 MEQ tablet Take 1 tablet (20 mEq total)  by mouth daily. 90 tablet 3   pravastatin (PRAVACHOL) 20 MG tablet Take 1 tablet (20 mg total) by mouth at bedtime. 90 tablet 3   pregabalin (LYRICA) 100 MG capsule Take 100 mg by mouth 3 (three) times daily.     Respiratory Therapy Supplies (NEBULIZER) DEVI Use as directed with nebulizer solution. 1 each 1   Respiratory Therapy Supplies (NEBULIZER/TUBING/MOUTHPIECE) KIT Use as directed with nebulizer machine 1 kit 12   Semaglutide, 1 MG/DOSE, (  OZEMPIC, 1 MG/DOSE,) 2 MG/1.5ML SOPN Take 1 mg by mouth once a week.     tiZANidine (ZANAFLEX) 4 MG tablet Take 1 tablet (4 mg total) by mouth every 6 (six) hours as needed for muscle spasms. 30 tablet 0   triamcinolone cream (KENALOG) 0.1 % 1 application topically below the face avoiding the groin, and underarms 45 g 0   zolpidem (AMBIEN) 10 MG tablet TAKE 1/2 TO 1 TABLET BY  MOUTH AT BEDTIME AS NEEDED  FOR SLEEP 90 tablet 1   Bepotastine Besilate (BEPREVE) 1.5 % SOLN Place 1 drop into both eyes 2 (two) times daily as needed (for itchy water eyes). 10 mL 5   No current facility-administered medications for this visit.     Known medication allergies: Allergies  Allergen Reactions   Ramipril Other (See Comments)   Cymbalta [Duloxetine Hcl] Other (See Comments)    Caused hair loss   Methylprednisolone Acetate Hives and Rash    05/29/20 developed hives "all over my back" after MBB; resolved with Benadryl and "a few days."  Can tolerate Dexamethasone/Decadron. 05/29/20 developed hives "all over my back" after MBB; resolved with Benadryl and "a few days."  Can tolerate Dexamethasone/Decadron.   Rosuvastatin Calcium Other (See Comments) and Cough    Rapid heart rate   Almond Meal Itching and Swelling    Tongue swells and itches   Almond Oil Itching and Swelling    Itching and swelling of tongue    Carvedilol Cough   Fish Allergy Itching    Halibut fish   Losartan Potassium Cough   Morphine And Related Itching     Physical examination: Blood  pressure 130/76, pulse 88, temperature 98 F (36.7 C), resp. rate 20, height _0  (1.626 m), weight 168 lb (76.2 kg), SpO2 97 %.  General: Alert, interactive, in no acute distress. HEENT: PERRLA, TMs pearly gray, turbinates mildly edematous with clear discharge, post-pharynx non erythematous. Neck: Supple without lymphadenopathy. Lungs: Clear to auscultation without wheezing, rhonchi or rales. {no increased work of breathing. CV: Normal S1, S2 without murmurs. Abdomen: Nondistended, nontender. Skin: Warm and dry, without lesions or rashes. Extremities:  No clubbing, cyanosis or edema. Neuro:   Grossly intact.  Diagnositics/Labs: None today  Assessment and plan:   Allergic rhinitis with conjunctivitis - Continue Flonase 2 sprays daily as needed.  Use flonase for 1-2 weeks at a time before stopping once symptoms improve. -at this time Atrovent does not seem effective anymore.   Change to Astelin 2 sprays each nostril twice a day for drainage control - Continue saline nasal rinses once a day for nasal symptoms. Use before nasal sprays - Continue carbinoxamine (RyVent) 6 mg tab 1-2 tab twice a day. - Continue Singulair 10 mg at bedtime - Can use Bepreve 1 drop in each twice a day as needed for watery, itchy, red eyes - Continue allergen avoidance measures - Concern you have had another sinus infection.  If symptoms are not improved with above changes in the next 2-3 days then would take Augmentin 869m 1 tab twice a day x 10 days for sinus infection treatment   Moderate persistent asthma with exacerbation - Continue Symbicort 160 - 2 puffs twice daily - Continue singulair as above  - Have access to albuterol inhaler 2 puffs every 4-6 hours as needed for cough/wheeze/shortness of breath/chest tightness.  May use 15-20 minutes prior to activity.   Monitor frequency of use.   - Continue Nucala monthly autoinjector to allow for ease of  home administration.  Daugther is giving her injections  once a month.    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 - Carry EpiPen at all times.   Flexural atopic dermatitis - Continue moisturizing routine with Aveeno - Use triamcinolone 0.1% below the face.   Adverse effect of drug  -Continue to avoid Depo-Medrol   Follow up in 6 months or sooner if needed.   I appreciate the opportunity to take part in Jadaya's care. Please do not hesitate to contact me with questions.  Sincerely,   Prudy Feeler, MD Allergy/Immunology [facility]

## 2022-04-21 NOTE — Patient Instructions (Addendum)
Allergic rhinitis with conjunctivitis - Continue Flonase 2 sprays daily as needed.  Use flonase for 1-2 weeks at a time before stopping once symptoms improve. -at this time Atrovent does not seem effective anymore.   Change to Astelin 2 sprays each nostril twice a day for drainage control - Continue saline nasal rinses once a day for nasal symptoms. Use before nasal sprays - Continue carbinoxamine (RyVent) 6 mg tab 1-2 tab twice a day. - Continue Singulair 10 mg at bedtime - Can use Bepreve 1 drop in each twice a day as needed for watery, itchy, red eyes - Continue allergen avoidance measures - Concern you have had another sinus infection.  If symptoms are not improved with above changes in the next 2-3 days then would take Augmentin 875mg  1 tab twice a day x 10 days for sinus infection treatment   Moderate persistent asthma with exacerbation - Continue Symbicort 160 - 2 puffs twice daily - Continue singulair as above  - Have access to albuterol inhaler 2 puffs every 4-6 hours as needed for cough/wheeze/shortness of breath/chest tightness.  May use 15-20 minutes prior to activity.   Monitor frequency of use.   - Continue Nucala monthly autoinjector to allow for ease of home administration.  Daugther is giving her injections once a month.    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 - Carry EpiPen at all times.   Flexural atopic dermatitis - Continue moisturizing routine with Aveeno - Use triamcinolone 0.1% below the face.   Adverse effect of drug  -Continue to avoid Depo-Medrol   Follow up in 6 months or sooner if needed.

## 2022-04-25 ENCOUNTER — Telehealth: Payer: Self-pay | Admitting: Allergy

## 2022-04-25 ENCOUNTER — Ambulatory Visit (INDEPENDENT_AMBULATORY_CARE_PROVIDER_SITE_OTHER): Payer: Medicaid Other | Admitting: Psychiatry

## 2022-04-25 DIAGNOSIS — F3181 Bipolar II disorder: Secondary | ICD-10-CM

## 2022-04-25 MED ORDER — PREDNISONE 10 MG PO TABS: ORAL_TABLET | ORAL | 0 refills | Status: DC

## 2022-04-25 NOTE — Telephone Encounter (Signed)
Patient called to say she is feeling worse. She still has all the symptoms from her visit last Thursday. Now she is coughing and throwing up phlegm and is not getting better. Patient has been doing her nasal sprays and nasal rinses. Patient picked up her augmentin on Friday morning and has been taking it.   Please advise.   Best contact number: [phone] .

## 2022-04-25 NOTE — Telephone Encounter (Signed)
Spoken to patient and notified Christine's comments.  Patient stated that she can tolerate oral prednisone and verbalized understanding for [place] to go ahead and sent Rx to [facility].

## 2022-04-25 NOTE — Telephone Encounter (Signed)
Is she able to tolerate oral prednisone? I see where she has listed methylprednisolone as an allergy. If she can tolerate oral prednisone send in a prescription for prednisone 10 mg taking  1 tablet twice a day for 4 days then on the 5th day take one tablet and stop.Quantity 9 tablets with no refills. She will need to check her blood sugar since she is a diabetic.

## 2022-04-25 NOTE — Progress Notes (Signed)
[facility] Counselor/Therapist Progress Note  Patient ID: Chelsea Benson, MRN: [id number],    Date: 04/25/2022  Time Spent: 48 minutes   Treatment Type: Individual Therapy  Reported Symptoms:  anxiety, "no depression", frustration and some anger with brother who lives with patient  Mental Status Exam:  Appearance:   Casual     Behavior:  Appropriate, Sharing, and Motivated  Motor:  Normal  Speech/Language:   Clear and Coherent  Affect:  Anxious, frustrated  Mood:  anxious  Thought process:  goal directed  Thought content:    Rumination  Sensory/Perceptual disturbances:    WNL  Orientation:  oriented to person, place, time/date, situation, day of week, month of year, year, and stated date of Apr 25, 2022  Attention:  Good  Concentration:  Good and Fair  Memory:  WNL  Fund of knowledge:   Good  Insight:    Good and Fair  Judgment:   Good and Fair  Impulse Control:  Fair   Risk Assessment: Danger to Self:  No Self-injurious Behavior: No Danger to Others: No Duty to Warn:no Physical Aggression / Violence:No  Access to Firearms a concern: No  Gang Involvement:No   Subjective:  Patient today reporting anxiety, frustration with brother, some anger but it's better now, "and no depression." Needing to process her anxiety and frustration with brother as his drinking is increasing, not doing anything to help patient out at her home where brother is still currently living. Did well in venting her frustration and her plan to potentially to move and live with a sister who is encouraging patient to come live with her. Talked through her frustrations and anger today which helped he to be calm and more grounded by session end. States her back is "doing better after surgery, able to sit up several hours at the time, sitting ok, but not pushing it." Frustrated with her grandson who is not doing well, doesn't listen, doesn't manage his money well." A little improvement re: not assuming  the worst case scenarios, getting rest as needed, setting healthy boundaries, contact with supportive friends.   Interventions: Solution-Oriented/Positive Psychology, Ego-Supportive, and Insight-Oriented  Treatment Goals:   Goals remain on tx plan as patient works on strategies to meet her goals.  Progress will be documented each visit in "Progress" section on Plan.   Long term goal: (measurable) Reduce overall level, frequency, and intensity of the anxiety so that daily functioning is not impaired.  Patient will eventually report a rating of "4" or less" on 1-10 anxiety scale for a 40-month time period where her daily functioning is not impaired.  Short term goal: Increase understanding of beliefs and messages that produce the worry and anxiety. Strategy: Patient will explore and work to stop cognitive messages that feed anxiety and work to replace them with more positive and empowering messages   Diagnosis:   ICD-10-CM   1. Bipolar II disorder ([place])  F31.81      Plan:   Patient today showing good participation and motivation as she worked on her anxiety which related a lot to some frustration and anger with her brother.  Talked through the situation and feels that she handled it so far the best way possible and also explained how she is needing to have a clear boundaries between him and her.  Is planning to go visit a sister after she has cataract surgery, and this sister is wanting patient to come live with her.  Patient is willing to  go and just visit for now and we will decide later about whether she wants to move or not.  Patient states that it is up to her daughter as to whether or not brother can continue to live there because daughter actually owns the home.  Patient has explained to daughter about brother's behavioral issues.  Hoping to have her cataract surgery done soon as she is looking forward to being able to visit her sister and take a break from brother's behavior.  Right now she  does not have another place to go other than the sister's home.  Does feel she would definitely be safe there.  Supported her decision to go visit with her sister as long as her doctor is okay with that and patient is feeling it would be a good break for her. Encouraged patient in her practice of more affirming and positive behaviors including: Staying in contact with people who are supportive, remain on her medications as prescribed, lessen her exposure to disturbing news whether it is on TV or online, believing more in herself and recognize her strengths versus limitations, recognize and reflect on her progress daily, trust the support and care of others, refraining from jumping to conclusions and assuming worst case scenarios, have frequent contact with family members that are healthy for her, set limits with those who are unhealthy for her, maintain contact with close neighbor friend who is helpful to her, get outside on her porch as she is able to do so safely as that is a place she really enjoys, interrupt anxious/negative thoughts to replace them with more realistic/empowering thoughts, allow for better sleep patterns, healthy nutrition, stay in the present focusing on what she can control, positive self talk, and realize the strength she shows working with goal directed behaviors aimed at improving her emotional health and overall outlook.  Goal review and progress/challenges noted with patient.  Next appointment within 3 to 4 weeks.  This record has been created using [employer].  Chart creation errors have been sought, but may not always have been located and corrected.  Such creation errors do not reflect on the standard of medical care provided.   Shanon Ace, LCSW

## 2022-04-27 ENCOUNTER — Telehealth: Payer: Self-pay | Admitting: Allergy

## 2022-04-27 NOTE — Telephone Encounter (Signed)
Chelsea Benson called in and states she received a letter from her insurance company stating that the manufacturer for her [employer]leda Grana is going out of business and will no longer make her nose spray.  Chelsea Benson wants to know if this will affect her flonase from here on out?  If so, she wants to know how this can be fixed for her?  Please advise.

## 2022-04-28 MED ORDER — TRIAMCINOLONE ACETONIDE 55 MCG/ACT NA AERO: 2.0000 | INHALATION_SPRAY | Freq: Every day | NASAL | 5 refills | Status: DC

## 2022-04-28 NOTE — Telephone Encounter (Signed)
Per Dr. Willette Brace has been sent into verified pharmacy. Patient has been called and notified of medication being sent into pharmacy.

## 2022-04-28 NOTE — Telephone Encounter (Signed)
Patient called to check in on status of new nasal spray. I spoke to Dr. Delorse Lek, patient's options are Nasacort or Rhinocort - unsure if insurance will cover.   I advised patient of this & let her know either one would be sent in for her.   Patient is a requesting a call once nasal spray is sent in, [phone]

## 2022-05-04 DIAGNOSIS — E1169 Type 2 diabetes mellitus with other specified complication: Secondary | ICD-10-CM | POA: Diagnosis not present

## 2022-05-04 DIAGNOSIS — E1140 Type 2 diabetes mellitus with diabetic neuropathy, unspecified: Secondary | ICD-10-CM | POA: Diagnosis not present

## 2022-05-04 DIAGNOSIS — E118 Type 2 diabetes mellitus with unspecified complications: Secondary | ICD-10-CM | POA: Diagnosis not present

## 2022-05-04 DIAGNOSIS — M5459 Other low back pain: Secondary | ICD-10-CM | POA: Diagnosis not present

## 2022-05-04 DIAGNOSIS — G4733 Obstructive sleep apnea (adult) (pediatric): Secondary | ICD-10-CM | POA: Diagnosis not present

## 2022-05-06 ENCOUNTER — Telehealth: Payer: Self-pay | Admitting: Allergy

## 2022-05-06 MED ORDER — CARBINOXAMINE MALEATE 4 MG PO TABS
1.0000 | ORAL_TABLET | Freq: Two times a day (BID) | ORAL | 1 refills | Status: DC
Start: 2022-05-06 — End: 2022-08-09

## 2022-05-06 NOTE — Telephone Encounter (Signed)
Sent in refill to [facility] of carbin[facility]

## 2022-05-06 NOTE — Telephone Encounter (Signed)
Patient is requesting a refill on Carbinoxamine sent to [facility].

## 2022-05-12 DIAGNOSIS — Z1231 Encounter for screening mammogram for malignant neoplasm of breast: Secondary | ICD-10-CM | POA: Diagnosis not present

## 2022-05-16 ENCOUNTER — Ambulatory Visit (INDEPENDENT_AMBULATORY_CARE_PROVIDER_SITE_OTHER): Payer: Medicare Other | Admitting: Psychiatry

## 2022-05-16 DIAGNOSIS — F3181 Bipolar II disorder: Secondary | ICD-10-CM

## 2022-05-16 NOTE — Progress Notes (Signed)
[facility] Counselor/Therapist Progress Note  Patient ID: Chelsea Benson, MRN: [id number],    Date: 05/16/2022  Time Spent: 50 minutes    Virtual Visit via Telehealth Note:  MyChart Video session  Connected with patient by a telemedicine/telehealth application, with their informed consent, and verified patient privacy and that I am speaking with the correct person using two identifiers. I discussed the limitations, risks, security and privacy concerns of performing psychotherapy and the availability of in person appointments. I also discussed with the patient that there may be a patient responsible charge related to this service. The patient expressed understanding and agreed to proceed. I discussed the treatment planning with the patient. The patient was provided an opportunity to ask questions and all were answered. The patient agreed with the plan and demonstrated an understanding of the instructions. The patient was advised to call  our office if  symptoms worsen or feel they are in a crisis state and need immediate contact.   Therapist Location: office Patient Location: home   Treatment Type: Individual Therapy  Reported Symptoms: anxiety, angry, "not really depressed"  Mental Status Exam:  Appearance:   Casual     Behavior:  Appropriate, Sharing, and Motivated  Motor:  Uses rollator or cane if I leave the apt  Speech/Language:   Clear and Coherent  Affect:  anxious  Mood:  anxious  Thought process:  goal directed  Thought content:    overthinking  Sensory/Perceptual disturbances:    WNL  Orientation:  oriented to person, place, time/date, situation, day of week, month of year, year, and stated date of May 16, 2022  Attention:  Fair  Concentration:  Fair  Memory:  "Not really unless I get distracted"  Fund of knowledge:   Good and Fair  Insight:    Good and Fair  Judgment:   Good  Impulse Control:  Good and Fair   Risk Assessment: Danger to Self:   No Self-injurious Behavior: No Danger to Others: No Duty to Warn:no Physical Aggression / Violence:No  Access to Firearms a concern: No  Gang Involvement:No   Subjective: Patient today reporting anxiety, frustration and anger due to adult daughter making poor choices, some of which affect patient because she is worried about a grand-daughter in the home although patient has spoken with grand-daughter who says that things are ok in the home. Patient doesn't have many facts about the situation and admits she is assuming things. Does plan to speak with her other adult daughter who is reportedly more level-headed and knows that would help and that she would take action if she had any concerns about other daughter's harm. Feeling more grounded by session end after venting her concerns, denies any thoughts to harm self or others, and plans to follow up with other daughter "the one who teaches."   Interventions: Cognitive Behavioral Therapy and Solution-Oriented/Positive Psychology  Treatment Goals:   Goals remain on tx plan as patient works on strategies to meet her goals.  Progress will be documented each visit in "Progress" section on Plan.   Long term goal: (measurable) Reduce overall level, frequency, and intensity of the anxiety so that daily functioning is not impaired.  Patient will eventually report a rating of "4" or less" on 1-10 anxiety scale for a 21-month time period where her daily functioning is not impaired.  Short term goal: Increase understanding of beliefs and messages that produce the worry and anxiety. Strategy: Patient will explore and work to stop cognitive  messages that feed anxiety and work to replace them with more positive and empowering messages  Diagnosis:   ICD-10-CM   1. Bipolar II disorder ([place])  F31.81      Plan:  Patient today showing good motivation and participation mostly related to one of her daughters and the choices she makes, and her brother and the choices  that he makes that are not healthy and some of which impact patient.  Having some healthier boundaries with brother and states that she has not decided about going to visit her sister just yet lives out of state.  Continues to do better physically since her back surgery and states "I am getting better and better each day from it".  Reviewed treatment goals especially short-term goal and strategy for her to continue working on between now and next session. Encouraged patient in practicing more positive and affirming behaviors including: Remaining in contact with people who are supportive, remain on her medications as prescribed, lessen her exposure to disturbing news on TV or online, believe more in herself and recognize her strengths versus limitations, recognize and reflect on her progress often, trust the support and care of others, refraining from jumping to conclusions and assuming worst case scenarios, frequent contact with family members that are healthy for her, set limits with those who are unhealthy for her, maintain contact with a close neighbor friend who is helpful to her, get outside on her porch as she is able to do so safely as that is a place she really enjoys, interrupt anxious/negative thoughts to replace them with more realistic, empowering thoughts, allow for better sleep patterns, healthy nutrition, stay in the present focusing on what she can control, positive self talk, and recognize the strengths she shows working with goal directed behaviors aimed at improving her emotional health and overall wellbeing.  Goal review and progress/challenges noted with patient.  Next appointment within 3 weeks.  This record has been created using [employer].  Chart creation errors have been sought, but may not always have been located and corrected.  Such creation errors do not reflect on the standard of medical care provided.   Shanon Ace, LCSW

## 2022-05-19 ENCOUNTER — Ambulatory Visit: Payer: Medicare Other | Admitting: Allergy

## 2022-05-22 ENCOUNTER — Other Ambulatory Visit: Payer: Self-pay | Admitting: Cardiology

## 2022-05-26 DIAGNOSIS — H524 Presbyopia: Secondary | ICD-10-CM | POA: Diagnosis not present

## 2022-05-26 DIAGNOSIS — E232 Diabetes insipidus: Secondary | ICD-10-CM | POA: Diagnosis not present

## 2022-05-26 DIAGNOSIS — H2513 Age-related nuclear cataract, bilateral: Secondary | ICD-10-CM | POA: Diagnosis not present

## 2022-05-26 DIAGNOSIS — H52223 Regular astigmatism, bilateral: Secondary | ICD-10-CM | POA: Diagnosis not present

## 2022-05-26 DIAGNOSIS — H5203 Hypermetropia, bilateral: Secondary | ICD-10-CM | POA: Diagnosis not present

## 2022-05-26 DIAGNOSIS — E119 Type 2 diabetes mellitus without complications: Secondary | ICD-10-CM | POA: Diagnosis not present

## 2022-05-27 ENCOUNTER — Other Ambulatory Visit: Payer: Self-pay | Admitting: Allergy

## 2022-06-04 ENCOUNTER — Other Ambulatory Visit: Payer: Self-pay | Admitting: Psychiatry

## 2022-06-04 DIAGNOSIS — F419 Anxiety disorder, unspecified: Secondary | ICD-10-CM

## 2022-06-06 ENCOUNTER — Ambulatory Visit: Payer: 59 | Admitting: Psychiatry

## 2022-06-06 DIAGNOSIS — H2512 Age-related nuclear cataract, left eye: Secondary | ICD-10-CM | POA: Diagnosis not present

## 2022-06-06 DIAGNOSIS — H25013 Cortical age-related cataract, bilateral: Secondary | ICD-10-CM | POA: Diagnosis not present

## 2022-06-06 DIAGNOSIS — H25043 Posterior subcapsular polar age-related cataract, bilateral: Secondary | ICD-10-CM | POA: Diagnosis not present

## 2022-06-06 DIAGNOSIS — H2513 Age-related nuclear cataract, bilateral: Secondary | ICD-10-CM | POA: Diagnosis not present

## 2022-06-06 DIAGNOSIS — E119 Type 2 diabetes mellitus without complications: Secondary | ICD-10-CM | POA: Diagnosis not present

## 2022-06-08 ENCOUNTER — Ambulatory Visit (INDEPENDENT_AMBULATORY_CARE_PROVIDER_SITE_OTHER): Payer: Medicare Other | Admitting: Psychiatry

## 2022-06-08 DIAGNOSIS — F419 Anxiety disorder, unspecified: Secondary | ICD-10-CM | POA: Diagnosis not present

## 2022-06-08 NOTE — Progress Notes (Signed)
[facility] Counselor/Therapist Progress Note  Patient ID: Chelsea Benson, MRN: [id number],    Date: 06/08/2022  Time Spent: 50 minutes   Virtual Visit via Telehealth Note: MyChart Video session Connected with patient by a telemedicine/telehealth application, with their informed consent, and verified patient privacy and that I am speaking with the correct person using two identifiers. I discussed the limitations, risks, security and privacy concerns of performing psychotherapy and the availability of in person appointments. I also discussed with the patient that there may be a patient responsible charge related to this service. The patient expressed understanding and agreed to proceed. I discussed the treatment planning with the patient. The patient was provided an opportunity to ask questions and all were answered. The patient agreed with the plan and demonstrated an understanding of the instructions. The patient was advised to call  our office if  symptoms worsen or feel they are in a crisis state and need immediate contact.   Therapist Location: office Patient Location: home   Treatment Type: Individual Therapy  Reported Symptoms:  anxiety, depression  Mental Status Exam:  Appearance:   Casual     Behavior:  Appropriate, Sharing, and Motivated  Motor:  Uses cane to walk  Speech/Language:   Clear and Coherent  Affect:  Depressed and anxious  Mood:  anxious and depressed  Thought process:  goal directed  Thought content:    Some overthinking and rumination  Sensory/Perceptual disturbances:    WNL  Orientation:  oriented to person, place, time/date, situation, day of week, month of year, year, and stated date of June 08, 2022  Attention:  Good  Concentration:  Good and Fair  Memory:  WNL  Fund of knowledge:   Good  Insight:    Good and Fair  Judgment:   Good  Impulse Control:  Good   Risk Assessment: Danger to Self:  No Self-injurious Behavior: No Danger to Others:  No Duty to Warn:no Physical Aggression / Violence:No  Access to Firearms a concern: No  Gang Involvement:No   Subjective:  Patient today reporting anxiety, some depression, sadness, and anger due to personal and family issues, and DSS. Significant stress within family, with young children involved. (Not all details included in this note due to patient privacy needs.) Upset, anxious, some frustration with other family member.and patient talking through all this today.  Needing to set and keep healthier boundaries. Denies any SI/HI and shows no indication of this. Concerned about her cataracts and is scheduled for first surgery Aug. 17, 2023.   Interventions: Cognitive Behavioral Therapy and Solution-Oriented/Positive Psychology  Treatment Goals:   Goals remain on tx plan as patient works on strategies to meet her goals.  Progress will be documented each visit in "Progress" section on Plan.   Long term goal: (measurable) Reduce overall level, frequency, and intensity of the anxiety so that daily functioning is not impaired.  Patient will eventually report a rating of "4" or less" on 1-10 anxiety scale for a 63-month time period where her daily functioning is not impaired.  Short term goal: Increase understanding of beliefs and messages that produce the worry and anxiety. Strategy: Patient will explore and work to stop cognitive messages that feed anxiety and work to replace them with more positive and empowering messages  Diagnosis:   ICD-10-CM   1. Anxiety disorder, unspecified type  F41.9      Plan:  Patient today showing good participation and motivation focusing on recently personal and family issues  as noted above. Patient wanting to have clearer boundaries and discussed today how she can do that and she states she'd needing to "take one day at the time, pay attention to my own needs, and trying to set better boundaries." Diabetes "is better and I won't have to be monitored as closely".   Discussed healthier boundaries and what specifically that would look like for her.  Reports physically she is continuing to do better following back surgery several weeks ago.  Review of treatment goal plan and goal directed behaviors especially those relating to decreasing worry and anxiety, having healthy boundaries, and interrupting/challenging/replacing anxious thoughts with more positive and empowering thoughts that can help reduce her anxiety. Encouraged patient in her practice of more positive and affirming behaviors including: Staying in contact with people who are supportive, remaining on her medications as prescribed, lessen her exposure to disturbing news on TV or online, trusting more in herself and recognizing her strengths versus limitations, recognize and reflect on her progress often, trust the support and care with family members that are healthy for her, set limits with those who are unhealthy for her, maintain contact with a close neighbor who has been a longtime support to her, get outside on her porch as she is able to do so safely as that is a place she really enjoys, interrupt anxious/negative thoughts to challenge and replace with more realistic and empowering thoughts, healthy nutrition, staying in the present focusing on what she can control, positive self talk, and recognize the strength she shows working with goal directed behaviors to move in a direction of improved emotional health.  Goal review and progress/challenges noted with patient.  Next appointment within approx 3 weeks.  This record has been created using [employer].  Chart creation errors have been sought, but may not always have been located and corrected.  Such creation errors do not reflect on the standard of medical care provided.   Mathis Fare, LCSW

## 2022-06-22 ENCOUNTER — Other Ambulatory Visit: Payer: Self-pay | Admitting: Allergy

## 2022-06-23 ENCOUNTER — Ambulatory Visit
Admission: RE | Admit: 2022-06-23 | Discharge: 2022-06-23 | Disposition: A | Discharge status: Home / Self Care | Payer: Medicare Other | Source: Ambulatory Visit | Attending: Internal Medicine | Admitting: Internal Medicine

## 2022-06-23 ENCOUNTER — Ambulatory Visit (INDEPENDENT_AMBULATORY_CARE_PROVIDER_SITE_OTHER): Payer: Medicare Other

## 2022-06-23 VITALS — BP 124/87 | HR 90 | Temp 97.6°F | Resp 17

## 2022-06-23 DIAGNOSIS — J069 Acute upper respiratory infection, unspecified: Secondary | ICD-10-CM | POA: Diagnosis not present

## 2022-06-23 DIAGNOSIS — R053 Chronic cough: Secondary | ICD-10-CM | POA: Diagnosis not present

## 2022-06-23 DIAGNOSIS — R0602 Shortness of breath: Secondary | ICD-10-CM | POA: Diagnosis not present

## 2022-06-23 MED ORDER — AMOXICILLIN-POT CLAVULANATE 875-125 MG PO TABS: 1.0000 | ORAL_TABLET | Freq: Two times a day (BID) | ORAL | 0 refills | Status: DC

## 2022-06-23 MED ORDER — BENZONATATE 100 MG PO CAPS: 100.0000 mg | ORAL_CAPSULE | Freq: Three times a day (TID) | ORAL | 0 refills | Status: DC | PRN

## 2022-06-23 MED ORDER — PREDNISONE 20 MG PO TABS: 40.0000 mg | ORAL_TABLET | Freq: Every day | ORAL | 0 refills | Status: AC

## 2022-06-23 NOTE — ED Triage Notes (Signed)
Pt is present today with c/o cough, mid back pain, and chest congestion Pt sx started x3 weeks ago.  Pt states she used her inhaler this morning and felt a little relief.

## 2022-06-23 NOTE — Discharge Instructions (Signed)
Your chest x-ray was normal.  You have been prescribed antibiotic, prednisone, cough medication.  Please follow-up if symptoms persist or worsen.

## 2022-06-23 NOTE — ED Provider Notes (Signed)
[name]    CSN: [id number] Arrival date & time: 06/23/22  0941      History   Chief Complaint Chief Complaint  Patient presents with   Cough   Back Pain    Chest congestion    HPI Chelsea Benson is a 71 y.o. female.   Patient presents with nasal congestion, runny nose, cough that started about 3 weeks ago.  Patient reports upper back pain that she is attributing to forceful coughing.  Denies chest pain, sore throat, ear pain, nausea, vomiting, diarrhea, abdominal pain.  Patient does report intermittent shortness of breath.  Reports that she has asthma and has had to use her albuterol inhaler with improvement.  Denies any known fevers or sick contacts.   Cough Back Pain   Past Medical History:  Diagnosis Date   Allergic rhinoconjunctivitis    Anginal pain (HCC)    Anxiety    Arthritis    Spondylolysis, knee and ankle arthritis pains.   Asthma    Bipolar disorder ([place])    Chronic back pain    Has had several surgeries.  He uses a walker   Depression    Diabetes mellitus without complication ([place])    dx in 2007   Diabetes mellitus, type II ([place])    Dysrhythmia    Eczema    Headache(784.0)    sinus related    History of kidney stones    History of nephrolithiasis    Hx of pulmonary embolus 2017   Hyperlipidemia    Hypertension    Insomnia    Memory change    OSA on CPAP    PSG 05/24/2016: AHI 17/hour 02 mean 76%.  HSAT 06/13/17, AHI 11-hour 02 mean 79%   Paroxysmal atrial fibrillation (HCC) 02/24/2021   CHA2DS2-VASc score 4 (HTN, DM, female, age-83); on Eliquis   Pneumonia    Tremor of both hands     Patient Active Problem List   Diagnosis Date Noted   HNP (herniated nucleus pulposus), lumbar 01/03/2022   Paroxysmal atrial fibrillation (HCC); CHA2DS2-VASc score 4 HTN, DM, female, age)-on Eliquis 02/24/2021   Leg cramps - Left Calf 12/31/2020   Pain of anterior chest wall with respiration 06/17/2020   Rapid palpitations 06/17/2020    Acute pharyngitis 01/13/2020   Cough variant asthma vs UACS 06/06/2019   Moderate persistent asthma with acute exacerbation 03/13/2019   Allergic rhinitis due to allergen 03/13/2019   Allergic conjunctivitis of both eyes 03/13/2019   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   Anxiety hyperventilation 10/03/2018   Bipolar II disorder ([place]) 09/18/2018   Cardiac risk counseling 01/07/2018   Influenza 10/19/2017   Cough 10/19/2017   Allergy to almonds 10/19/2017   Flexural atopic dermatitis 10/19/2017   Allergic rhinoconjunctivitis 10/19/2017   Moderate persistent asthma, uncomplicated 21/22/4825   S/P arthroscopy of right knee 06/04/2017   Acute blood loss as cause of postoperative anemia 06/04/2017   Mood disorder ([place])    Allergy history unknown    Anxiety 05/24/2017   Diabetes mellitus ([place]) 05/24/2017   Hyperlipidemia associated with type 2 diabetes mellitus ([place]) 05/24/2017   Insomnia 05/24/2017   Primary osteoarthritis of right knee 05/22/2017   Osteoarthritis of right knee 05/18/2017   Allergic rhinitis due to pollen 10/06/2014   Arthritis 10/06/2014   Asthma 10/06/2014   Cubital tunnel syndrome, right 10/06/2014   Headache 10/06/2014   Essential hypertension 10/06/2014   Depression 08/07/2014   Postlaminectomy syndrome, lumbar region 09/06/2013  DDD (degenerative disc disease), lumbosacral 05/11/2013   Spinal stenosis of lumbar region 05/11/2013    Past Surgical History:  Procedure Laterality Date   ABDOMINAL HYSTERECTOMY     BACK SURGERY     2012- lumbar fusion   BREAST SURGERY     L cyst removed - 1972   BUNIONECTOMY Left    Great toe   calf -R- cyst removed     CARPAL TUNNEL RELEASE     both hands    CORONARY CT ANGIOGRAM  07/17/2019    Coronary calcium score 0.  Right dominant system.  Difficult to assess because of cardiac motion, but there is no obvious evidence of CAD.  Mild PA dilation.    HAMMER TOE SURGERY Left    L foot   NASAL  SINUS SURGERY     NM MYOVIEW LTD  12/2008   Normal study.  Normal LV function.  EF 61%.  Apical thinning but no ischemia or infarction.   SHOULDER ARTHROSCOPY Left    R shoulder- RCR   TOE SURGERY     TOTAL KNEE ARTHROPLASTY Right 05/22/2017   Procedure: TOTAL KNEE ARTHROPLASTY;  Surgeon: Frederik Pear, MD;  Location: [facility];  Service: Orthopedics;  Laterality: Right;   TRANSTHORACIC ECHOCARDIOGRAM  06/2009    Technically difficult.  Moderate concentric LVH with normal LV function.  No regional wall motion normalities.  EF> 55%.  Normal right ventricle.  No valve lesions.  Normal filling pressures.   TRIGGER FINGER RELEASE     L thumb   TUBAL LIGATION      OB History   No obstetric history on file.      Home Medications    Prior to Admission medications   Medication Sig Start Date End Date Taking? Authorizing Provider  amoxicillin-clavulanate (AUGMENTIN) 875-125 MG tablet Take 1 tablet by mouth every 12 (twelve) hours. 06/23/22  Yes , Hildred Alamin E, FNP  benzonatate (TESSALON) 100 MG capsule Take 1 capsule (100 mg total) by mouth every 8 (eight) hours as needed for cough. 06/23/22  Yes , Hildred Alamin E, FNP  predniSONE (DELTASONE) 20 MG tablet Take 2 tablets (40 mg total) by mouth daily for 5 days. 06/23/22 06/28/22 Yes , Michele Rockers, FNP  ACCU-CHEK GUIDE test strip TEST BID PRN 09/17/18   [provider]  albuterol (PROVENTIL) (2.5 MG/3ML) 0.083% nebulizer solution USE 1 VIAL IN NEBULIZER EVERY 4 HOURS - and as needed 07/06/21   Kennith Gain, MD  albuterol (VENTOLIN HFA) 108 (90 Base) MCG/ACT inhaler USE 2 INHALATIONS BY MOUTH EVERY 4 HOURS AS NEEDED FOR WHEEZING  OR SHORTNESS OF BREATH 05/30/22   Kennith Gain, MD  apixaban (ELIQUIS) 5 MG TABS tablet Take 1 tablet (5 mg total) by mouth 2 (two) times daily. 04/19/22   Leonie Man, MD  ARIPiprazole (ABILIFY) 2 MG tablet Take 2 tablets (4 mg total) by mouth daily. 03/22/22   [facility] Headings, PMHNP  azelastine  (ASTELIN) 0.1 % nasal spray Place 2 sprays into both nostrils 2 (two) times daily. Use in each nostril as directed 04/21/22   Kennith Gain, MD  Bepotastine Besilate (BEPREVE) 1.5 % SOLN Place 1 drop into both eyes 2 (two) times daily as needed (for itchy water eyes). 04/21/22   Kennith Gain, MD  Blood Glucose Monitoring Suppl (ACCU-CHEK GUIDE) w/Device KIT See admin instructions. 09/18/18   [provider]  budesonide-formoterol (SYMBICORT) 160-4.5 MCG/ACT inhaler INHALE 2 INHALATIONS BY MOUTH  INTO THE LUNGS IN THE MORNING  AND AT BEDTIME 02/28/22   Kennith Gain, MD  busPIRone (BUSPAR) 30 MG tablet Take 1 tablet (30 mg total) by mouth 2 (two) times daily. 03/22/22 06/20/22  [facility] Headings, PMHNP  CALCIUM-VITAMIN D PO Take 1 tablet by mouth daily.    [provider]  Carbinoxamine Maleate 4 MG TABS Take 1 tablet (4 mg total) by mouth in the morning and at bedtime. 05/06/22   Kennith Gain, MD  CLOBETASOL PROPIONATE E 0.05 % emollient cream Apply 1 application topically 2 (two) times daily as needed (rash).  01/22/19   [provider]  clonazePAM (KLONOPIN) 0.5 MG tablet Take 1/2-1 tab po TID prn anxiety. (Do not combine with Ambien) 03/22/22   [facility] Headings, PMHNP  desonide (DESOWEN) 0.05 % ointment APPLY TO AFFECTED AREA(S)  TOPICALLY TWICE DAILY AS  NEEDED 02/03/21   Kennith Gain, MD  diltiazem (CARDIZEM CD) 300 MG 24 hr capsule TAKE 1 CAPSULE BY MOUTH DAILY 02/14/22   Leonie Man, MD  diltiazem (CARDIZEM) 60 MG tablet TAKE DILITAZEM 60 MG BY MOUTH AS NEEDED FOR PALPITATIONS. YOU CAN TAKE EVERY 6 HOURS AS NEEDED UP TO TWICE DAILY WITH EACH EPISODE 03/08/22   Leonie Man, MD  EPINEPHrine 0.3 mg/0.3 mL IJ SOAJ injection INJECT INTRAMUSCULARLY 1  PEN AS NEEDED FOR ALLERGIC  RESPONSE AS DIRECTED BY MD. [place] AFTER  USE. 10/14/21   Kennith Gain, MD  famotidine (PEPCID) 20 MG tablet TAKE 1  TABLET BY MOUTH DAILY  AFTER SUPPER 03/28/22   Kennith Gain, MD  fluticasone (FLONASE) 50 MCG/ACT nasal spray USE 2 SPRAYS IN BOTH NOSTRILS  DAILY 01/25/22   Kennith Gain, MD  hydrochlorothiazide (HYDRODIURIL) 25 MG tablet TAKE 1 TABLET(25 MG) BY MOUTH DAILY 11/30/21   Leonie Man, MD  HYDROcodone-acetaminophen (NORCO/VICODIN) 5-325 MG tablet Take 1-2 tablets by mouth every 4 (four) hours as needed for moderate pain ((score 7 to 10)). 01/05/22   Kary Kos, MD  Lemborexant (DAYVIGO) 10 MG TABS Take 1 tablet by mouth at bedtime. 04/20/22   [facility] Headings, PMHNP  meloxicam (MOBIC) 7.5 MG tablet Take 1 tablet (7.5 mg total) by mouth daily. 12/10/21   Francene Finders, PA-C  Mepolizumab (NUCALA) 100 MG/ML SOAJ Inject 1 mL (100 mg total) into the skin every 28 (twenty-eight) days. 01/24/22   Kennith Gain, MD  metFORMIN (GLUCOPHAGE-XR) 500 MG 24 hr tablet Take 500 mg by mouth in the morning and at bedtime. 07/02/19   [provider]  mirtazapine (REMERON) 30 MG tablet TAKE 1 TABLET BY MOUTH AT  BEDTIME 03/22/22   [facility] Headings, PMHNP  montelukast (SINGULAIR) 10 MG tablet Take 1 tablet (10 mg total) by mouth at bedtime. 12/17/21   Kennith Gain, MD  Oxcarbazepine (TRILEPTAL) 300 MG tablet Take 2 tablets (600 mg total) by mouth at bedtime. 03/22/22   [facility] Headings, PMHNP  oxybutynin (DITROPAN-XL) 10 MG 24 hr tablet Take 10 mg by mouth daily. 12/17/21   [provider]  pantoprazole (PROTONIX) 40 MG tablet TAKE 1 TABLET BY MOUTH  DAILY. TAKE 30 TO 60  MINUTES BEFORE FIRST MEAL  OF THE DAY 10/25/19   Tanda Rockers, MD  Polyethyl Glycol-Propyl Glycol (SYSTANE OP) Place 1 drop into both eyes 2 (two) times daily as needed (dry eyes).    [provider]  potassium chloride SA (KLOR-CON M) 20 MEQ tablet TAKE 1 TABLET BY MOUTH DAILY 05/23/22   Leonie Man, MD  pravastatin (PRAVACHOL) 20 MG tablet Take 1 tablet (20 mg total) by mouth at  bedtime. 09/29/21   Leonie Man, MD  pregabalin (LYRICA) 100 MG capsule Take 100 mg by mouth 3 (three) times daily.    [provider]  Respiratory Therapy Supplies (NEBULIZER) DEVI Use as directed with nebulizer solution. 06/01/18   Valentina Shaggy, MD  Respiratory Therapy Supplies (NEBULIZER/TUBING/MOUTHPIECE) KIT Use as directed with nebulizer machine 04/17/20   [place], Rae Halsted, MD  Semaglutide, 1 MG/DOSE, (OZEMPIC, 1 MG/DOSE,) 2 MG/1.5ML SOPN Take 1 mg by mouth once a week.    [provider]  tiZANidine (ZANAFLEX) 4 MG tablet Take 1 tablet (4 mg total) by mouth every 6 (six) hours as needed for muscle spasms. 12/10/21   Francene Finders, PA-C  triamcinolone (NASACORT) 55 MCG/ACT AERO nasal inhaler Place 2 sprays into the nose daily. 04/28/22   Kennith Gain, MD  triamcinolone cream (KENALOG) 0.1 % 1 application topically below the face avoiding the groin, and underarms 07/06/21   Kennith Gain, MD  zolpidem (AMBIEN) 10 MG tablet TAKE 1/2 TO 1 TABLET BY  MOUTH AT BEDTIME AS NEEDED  FOR SLEEP 01/31/22   [facility] Headings, PMHNP    Family History Family History  Problem Relation Age of Onset   Diabetes Sister    Diabetes Brother    Hypertension Brother    Cancer Maternal Grandmother    Heart failure Maternal Grandmother    Hypertension Paternal Grandmother    Asthma Daughter    Cancer Daughter    Depression Daughter    Hypertension Daughter    Heart failure Maternal Aunt    Pneumonia Mother    Diabetes Mother    Alcoholism Father    Alcohol abuse Father    Depression Daughter    Schizophrenia Grandchild    Allergies Neg Hx    Eczema Neg Hx    Immunodeficiency Neg Hx     Social History Social History   Tobacco Use   Smoking status: Never    Passive exposure: Yes   Smokeless tobacco: Never   Tobacco comments:    grandson smokes, but in his car  Vaping Use   Vaping Use: Never used  Substance Use Topics   Alcohol use:  Yes    Comment: rare use   Drug use: No     Allergies   Ramipril, Cymbalta [duloxetine hcl], Methylprednisolone acetate, Rosuvastatin calcium, Almond meal, Almond oil, Carvedilol, Fish allergy, Losartan potassium, and Morphine and related   Review of Systems Review of Systems Per HPI  Physical Exam Triage Vital Signs ED Triage Vitals  Enc Vitals Group     BP 06/23/22 1000 124/87     Pulse Rate 06/23/22 1000 90     Resp 06/23/22 1000 17     Temp 06/23/22 1000 97.6 F (36.4 C)     Temp src --      SpO2 06/23/22 1000 95 %     Weight --      Height --      Head Circumference --      Peak Flow --      Pain Score 06/23/22 0958 0     Pain Loc --      Pain Edu? --      Excl. in [place]? --    No data found.  Updated Vital Signs BP 124/87   Pulse 90   Temp 97.6 F (36.4 C)   Resp 17   SpO2 95%  Visual Acuity Right Eye Distance:   Left Eye Distance:   Bilateral Distance:    Right Eye Near:   Left Eye Near:    Bilateral Near:     Physical Exam Constitutional:      General: She is not in acute distress.    Appearance: Normal appearance. She is not toxic-appearing or diaphoretic.  HENT:     Head: Normocephalic and atraumatic.     Right Ear: Tympanic membrane and ear canal normal.     Left Ear: Tympanic membrane and ear canal normal.     Nose: Congestion present.     Mouth/Throat:     Mouth: Mucous membranes are moist.     Pharynx: No posterior oropharyngeal erythema.  Eyes:     Extraocular Movements: Extraocular movements intact.     Conjunctiva/sclera: Conjunctivae normal.     Pupils: Pupils are equal, round, and reactive to light.  Cardiovascular:     Rate and Rhythm: Normal rate and regular rhythm.     Pulses: Normal pulses.     Heart sounds: Normal heart sounds.  Pulmonary:     Effort: Pulmonary effort is normal. No respiratory distress.     Breath sounds: Normal breath sounds. No stridor. No wheezing, rhonchi or rales.  Abdominal:     General: Abdomen  is flat. Bowel sounds are normal.     Palpations: Abdomen is soft.  Musculoskeletal:        General: Normal range of motion.     Cervical back: Normal range of motion.       Back:     Comments: Tenderness to palpation across upper back.  No obvious crepitus or step-off noted.  Skin:    General: Skin is warm and dry.  Neurological:     General: No focal deficit present.     Mental Status: She is alert and oriented to person, place, and time. Mental status is at baseline.  Psychiatric:        Mood and Affect: Mood normal.        Behavior: Behavior normal.      UC Treatments / Results  Labs (all labs ordered are listed, but only abnormal results are displayed) Labs Reviewed - No data to display  EKG   Radiology DG Chest 2 View  Result Date: 06/23/2022 CLINICAL DATA:  Shortness of breath. EXAM: CHEST - 2 VIEW COMPARISON:  Multiple priors, most recent February 14, 2019. FINDINGS: Mild streaky bibasilar opacities. No confluent consolidation. No visible pleural effusions or pneumothorax. Cardiomediastinal silhouette is mildly enlarged. Partially imaged lumbar fusion hardware. IMPRESSION: Mild chronic bibasilar atelectasis/scar. Electronically Signed   By: Margaretha Sheffield M.D.   On: 06/23/2022 10:29    Procedures Procedures (including critical care time)  Medications Ordered in UC Medications - No data to display  Initial Impression / Assessment and Plan / UC Course  I have reviewed the triage vital signs and the nursing notes.  Pertinent labs & imaging results that were available during my care of the patient were reviewed by me and considered in my medical decision making (see chart for details).     Chest x-ray was negative for any acute cardiopulmonary process.  It did show mild chronic changes.  There is concern for bronchitis.  Given duration of symptoms, will opt to treat with antibiotic.  Will treat with Augmentin given patient had antibiotic therapy a few months prior.   I also think the patient would benefit from prednisone to decrease information and help alleviate  harsh cough.  Patient also has asthma so prednisone should be helpful with asthma in the setting of acute illness.  Patient has taken prednisone before and has tolerated well.  Patient reports that her blood sugars tolerate prednisone well as well with the highest blood sugar being 150s when taking prednisone.  No other obvious contraindications to prednisone at this time.  Advised patient to monitor blood sugars very closely while prednisone.  Patient voiced understanding.  Do not think viral testing is necessary given duration of symptoms and would not change treatment.  Discussed return precautions.  Patient verbalized understanding and was agreeable with plan. Final Clinical Impressions(s) / UC Diagnoses   Final diagnoses:  Acute upper respiratory infection  Persistent cough for 3 weeks or longer     Discharge Instructions      Your chest x-ray was normal.  You have been prescribed antibiotic, prednisone, cough medication.  Please follow-up if symptoms persist or worsen.     ED Prescriptions     Medication Sig Dispense Auth. Provider   amoxicillin-clavulanate (AUGMENTIN) 875-125 MG tablet Take 1 tablet by mouth every 12 (twelve) hours. 14 tablet [place], [place] E, [place]   predniSONE (DELTASONE) 20 MG tablet Take 2 tablets (40 mg total) by mouth daily for 5 days. 10 tablet [place], [place] E, [place]   benzonatate (TESSALON) 100 MG capsule Take 1 capsule (100 mg total) by mouth every 8 (eight) hours as needed for cough. 21 capsule [place], Michele Rockers, [place]      PDMP not reviewed this encounter.   Teodora Medici, [place] 06/23/22 1047

## 2022-06-28 ENCOUNTER — Other Ambulatory Visit: Payer: Self-pay | Admitting: Cardiology

## 2022-06-29 ENCOUNTER — Ambulatory Visit (INDEPENDENT_AMBULATORY_CARE_PROVIDER_SITE_OTHER): Payer: Medicare Other | Admitting: Psychiatry

## 2022-06-29 DIAGNOSIS — F3181 Bipolar II disorder: Secondary | ICD-10-CM

## 2022-06-29 NOTE — Progress Notes (Signed)
[facility] Counselor/Therapist Progress Note  Patient ID: Chelsea Benson, MRN: [id number],    Date: 06/29/2022  Time Spent: 50 minutes   Virtual Visit via Telehealth Note: MyChart Video session Connected with patient by a telemedicine/telehealth application, with their informed consent, and verified patient privacy and that I am speaking with the correct person using two identifiers. I discussed the limitations, risks, security and privacy concerns of performing psychotherapy and the availability of in person appointments. I also discussed with the patient that there may be a patient responsible charge related to this service. The patient expressed understanding and agreed to proceed. I discussed the treatment planning with the patient. The patient was provided an opportunity to ask questions and all were answered. The patient agreed with the plan and demonstrated an understanding of the instructions. The patient was advised to call  our office if  symptoms worsen or feel they are in a crisis state and need immediate contact.   Therapist Location: office Patient Location: home   Treatment Type: Individual Therapy  Reported Symptoms: anxiety, some depression but better more recently  Mental Status Exam:  Appearance:   Casual     Behavior:  Appropriate, Sharing, and Motivated  Motor:  Uses rollator for walking  Speech/Language:   Clear and Coherent  Affect:  anxious  Mood:  anxious  Thought process:  goal directed  Thought content:    Some overthinking  Sensory/Perceptual disturbances:    WNL  Orientation:  oriented to person, place, time/date, situation, day of week, month of year, year, and stated date of June 29, 2022  Attention:  Good  Concentration:  Good  Memory:  WNL  Fund of knowledge:   Good  Insight:    Good and Fair  Judgment:   Good and Fair  Impulse Control:  Good   Risk Assessment: Danger to Self:  No Self-injurious Behavior: No Danger to Others:  No Duty to Warn:no Physical Aggression / Violence:No  Access to Firearms a concern: No  Gang Involvement:No   Subjective: Patient today reports anxiety as main symptom, and her depression is improving recently. Is feeling more calm re: DSS and other family issue. Cataract surgery scheduled for Aug. 17, 2023 and looking forward to getting that done. States anxiety and difficult situation with younger daughter still persists. Has joined a "[employer]" where she can her medical care and participate in social activities. Plans to visit the center this Friday. Processed the relationship issues with her younger daughter in more detail today and showing more patience and waiting to talk with daughter "when the timing is right for both of [place]", and trying not to stress over it as much. Feels getting involved in group mentioned above may offer her more socialization and positive interactions.   Interventions: Cognitive Behavioral Therapy  Treatment Goals:   Goals remain on tx plan as patient works on strategies to meet her goals.  Progress will be documented each visit in "Progress" section on Plan.   Long term goal: (measurable) Reduce overall level, frequency, and intensity of the anxiety so that daily functioning is not impaired.  Patient will eventually report a rating of "4" or less" on 1-10 anxiety scale for a 26-month time period where her daily functioning is not impaired.  Short term goal: Increase understanding of beliefs and messages that produce the worry and anxiety. Strategy: Patient will explore and work to stop cognitive messages that feed anxiety and work to replace them with  more positive and empowering messages  Diagnosis:   ICD-10-CM   1. Bipolar II disorder (HCC)  F31.81      Plan:  Patient today showing good motivation and participation as she worked on her anxiety and depression, some related to the fact that one of her daughters is still not speaking to her  but feels that they will get that issue resolved soon.  Patient is not pushing it and instead waiting until her daughter is more receptive.  As noted above, she got information and joined a [employer]" where she can get her medical care and participate in social activities, but stressed that it is not an adult daycare.  She is to visit the Center at this Friday.  Reports that there is no cost to her and "maybe my Medicare covers it".  Feels that it might potentially be placed to offer more socialization and positive interactions with other people, as she is reluctant to get out much in her neighborhood for safety reasons.  Reports that her diabetes is continuing to be improved and is being monitored closely.  Also continue to work on healthy boundaries as discussed in the last session and revisited in session today, and could help in the relief of anxiety based on some of her past experiences discussed in sessions. Encouraged patient in her practice of more positive and affirming behaviors including: Remaining on her medication as prescribed, staying in contact with people who are supportive, lessen her exposure to disturbing news on TV or online, trusting more in herself and recognizing her strengths versus limitations, reflect on her progress often, trust the support and care with family members that are healthy for her, set limits for those who are unhealthy for her, maintain contact with close neighbor who is a longtime support to her, get outside on her porch as she is able as that is a place she really enjoys, healthy nutrition, stay in the present focusing on what she can control or change, positive self talk, and recognize the strength she shows when working with goal directed behaviors to move in a direction that supports her improved emotional health and overall wellbeing.  Goal review and progress/challenges noted with patient.  Next appointment within 2 to 3 weeks.  This  record has been created using [employer].  Chart creation errors have been sought, but may not always have been located and corrected.  Such creation errors do not reflect on the standard of medical care provided.  Mathis Fare, LCSW

## 2022-06-30 DIAGNOSIS — G4733 Obstructive sleep apnea (adult) (pediatric): Secondary | ICD-10-CM | POA: Diagnosis not present

## 2022-07-08 ENCOUNTER — Telehealth: Payer: Self-pay

## 2022-07-08 MED ORDER — FLUTICASONE PROPIONATE 50 MCG/ACT NA SUSP: NASAL | 1 refills | Status: DC

## 2022-07-08 NOTE — Telephone Encounter (Signed)
Patient called in - DOB/Pharmacy verified - stated Fluticasone (Flonase) was never sent in to [facility].  Electronically sent in medication refill to [facility].

## 2022-07-18 ENCOUNTER — Ambulatory Visit
Admission: RE | Admit: 2022-07-18 | Discharge: 2022-07-18 | Disposition: A | Discharge status: Home / Self Care | Payer: 59 | Source: Ambulatory Visit | Attending: Family | Admitting: Family

## 2022-07-18 ENCOUNTER — Other Ambulatory Visit: Payer: Self-pay | Admitting: Family

## 2022-07-18 DIAGNOSIS — R059 Cough, unspecified: Secondary | ICD-10-CM

## 2022-07-20 ENCOUNTER — Ambulatory Visit (INDEPENDENT_AMBULATORY_CARE_PROVIDER_SITE_OTHER): Payer: 59 | Admitting: Physician Assistant

## 2022-07-20 ENCOUNTER — Ambulatory Visit (INDEPENDENT_AMBULATORY_CARE_PROVIDER_SITE_OTHER): Payer: 59 | Admitting: Psychiatry

## 2022-07-20 ENCOUNTER — Encounter: Payer: Self-pay | Admitting: Physician Assistant

## 2022-07-20 VITALS — BP 100/70 | HR 90 | Ht 63.0 in | Wt 169.0 lb

## 2022-07-20 DIAGNOSIS — I517 Cardiomegaly: Secondary | ICD-10-CM | POA: Diagnosis not present

## 2022-07-20 DIAGNOSIS — F419 Anxiety disorder, unspecified: Secondary | ICD-10-CM

## 2022-07-20 DIAGNOSIS — I480 Paroxysmal atrial fibrillation: Secondary | ICD-10-CM | POA: Diagnosis not present

## 2022-07-20 DIAGNOSIS — I10 Essential (primary) hypertension: Secondary | ICD-10-CM | POA: Diagnosis not present

## 2022-07-20 NOTE — Assessment & Plan Note (Signed)
Blood pressure is currently controlled on a combination of diltiazem, HCTZ.

## 2022-07-20 NOTE — Assessment & Plan Note (Signed)
She has been maintaining sinus rhythm.  She is tolerating anticoagulation and remains on Eliquis in addition to diltiazem for rate control.

## 2022-07-20 NOTE — Assessment & Plan Note (Signed)
Cardiomegaly noted on recent chest x-ray.  We discussed the potential causes of cardiomegaly on x-ray.  She does have a long history of hypertension.  She is certainly at risk for hypertensive heart disease.  Her coronary CTA in 2021 demonstrated dilated pulmonary artery suggestive of pulmonary hypertension.  I have recommended that we proceed with a 2D echocardiogram to further evaluate.  Follow-up as previously planned.

## 2022-07-20 NOTE — Progress Notes (Signed)
[facility] Counselor/Therapist Progress Note  Patient ID: Chelsea Benson, MRN: [id number],    Date: 07/20/2022  Time Spent: 50 minutes   Virtual Visit via Telehealth Note: MyChart Video session Connected with patient by a telemedicine/telehealth application, with their informed consent, and verified patient privacy and that I am speaking with the correct person using two identifiers. I discussed the limitations, risks, security and privacy concerns of performing psychotherapy and the availability of in person appointments. I also discussed with the patient that there may be a patient responsible charge related to this service. The patient expressed understanding and agreed to proceed. I discussed the treatment planning with the patient. The patient was provided an opportunity to ask questions and all were answered. The patient agreed with the plan and demonstrated an understanding of the instructions. The patient was advised to call  our office if  symptoms worsen or feel they are in a crisis state and need immediate contact.   Therapist Location: office Patient Location: home    Treatment Type: Individual Therapy  Reported Symptoms: anxiety, frustrations with some family members  Mental Status Exam:  Appearance:   Casual     Behavior:  Appropriate, Sharing, and Motivated  Motor:  Normal  Speech/Language:   Clear and Coherent  Affect:  anxious  Mood:  anxious  Thought process:  goal directed  Thought content:    Some ruminating  Sensory/Perceptual disturbances:    WNL  Orientation:  oriented to person, place, time/date, situation, day of week, month of year, year, and stated date of August 2023  Attention:  Good  Concentration:  Good  Memory:  WNL  Fund of knowledge:   Good  Insight:    Good and Fair  Judgment:   Good  Impulse Control:  Good   Risk Assessment: Danger to Self:  No Self-injurious Behavior: No Danger to Others: No Duty to Warn:no Physical Aggression  / Violence:No  Access to Firearms a concern: No  Gang Involvement:No   Subjective:  Patient today reporting some return of behavioral issues with her 6 yr old brother that lives in her home with her. Patient's anxiety "is up some as she is having an enlarged heart issue checked out with a cardiologist" later today. Anxiety remains "my most frequent symptom and I worry a lot". States she has no depression. Tomorrow is having cataract surgery and other eye will be on Sept 7th. No further contact with daughter re:DSS situation. Daughter is being evasive with patient, and patient states she is getting better in managing the evasiveness. Processed in detail multiple family situations that are challenging. Shared with patient how she seems to have gotten stronger throughout various issues and obstacles, showing good effort, holding onto her really strong faith, believing more in herself, and keeping positive contact with others that are healthy for her.    Interventions: Cognitive Behavioral Therapy and Ego-Supportive  Treatment Goals:   Goals remain on tx plan as patient works on strategies to meet her goals.  Progress will be documented each visit in "Progress" section on Plan.   Long term goal: (measurable) Reduce overall level, frequency, and intensity of the anxiety so that daily functioning is not impaired.  Patient will eventually report a rating of "4" or less" on 1-10 anxiety scale for a 73-month time period where her daily functioning is not impaired.  Short term goal: Increase understanding of beliefs and messages that produce the worry and anxiety. Strategy: Patient will explore and work  to stop cognitive messages that feed anxiety and work to replace them with more positive and empowering messages  Diagnosis:   ICD-10-CM   1. Anxiety disorder, unspecified type  F41.9      Plan: Patient actively working in session today on her anxiety especially in regards to some family situations and  her own health issues.  Denies any depression currently.  Stressed with a daughter that is not speaking to her but does accept things that patient gives her.  Patient does feel that situation will resolve eventually and is not pushing anything with daughter.  Working to curb her anxious thoughts and noticing some progress at times with that.  As noted above, she has cataract surgery tomorrow, experiencing some anxiety, "but not as much as I would have previously".  Also has an appointment this afternoon with cardiologist and states that she is not really experiencing a lot of anxiety about that either.  Realizes how anxious she used to feel prior to most of her appointments so does feel that she is improving her anxiety management and it helps to not always assume the worst case scenarios.  Staying in touch with other members of her family that are supportive especially a sister who lives out of state.  Also has supportive friends that she is in touch with.  To continue working with anxiety and stress in healthier ways and maintain some good contacts with other people that are healthy for her. Encouraged patient in practicing more positive and self-affirming behaviors including: Staying in contact with supportive people, remaining on her prescribed medication, continue lessening her exposure to disturbing news on TV or online, trusting herself and recognizing her strengths versus limitations, reflect on the progress she has made, trust the support and care of family members that are healthy for her and support her, set limits with others who are unhealthy for her, get outside on her porch as much as possible and good weather as that is a place she really enjoys, healthy nutrition, staying in the present focusing on what she can control or change versus jumping ahead or going back to the past, positive self talk, and realize the strength she shows working with goal directed behaviors to move in a direction that  supports her improved emotional health.  Review and progress/challenges noted with patient.  Next appointment within 3 to 4 weeks.  This record has been created using [employer].  Chart creation errors have been sought, but may not always have been located and corrected.  Such creation errors do not reflect on the standard of medical care provided.   Mathis Fare, LCSW

## 2022-07-20 NOTE — Progress Notes (Signed)
Cardiology Office Note:    Date:  07/20/2022   ID:  Chelsea Benson, DOB [birth year], MRN [id number]  PCP:  Marda Stalker, [facility] Cardiologist:  Glenetta Hew, MD    Referring MD: Marda Stalker, PA-C   Chief Complaint:  Evaluation for cardiomegaly    Patient Profile: Paroxysmal atrial fibrillation  CCTA 07/2020: CAC score 0, no CAD Diabetes mellitus  Hypertension  Hyperlipidemia  Asthma OSA Bipolar disorder Chronic back pain; several back surgeries (last 12/2021) Hx of pulmonary embolism in 2017   Prior CV Studies: CARDIAC TELEMETRY MONITORING-INTERPRETATION ONLY 02/05/2021  Mostly sinus rhythm, minimum heart rate 68 bpm, maximum 127 bpm.. Average heart rate 88 bpm.  Rare PACs and PVCs (<1%).  There is one brief spell that appears to possibly be atrial fibrillation but only 4-1/2 minutes. Rate ranges from 91 to 124 bpm from 10:56 PM to 10:59 PM. Symptoms not noted.   CT CORONARY MORPH W/CTA COR W/SCORE W/CA W/CM &/OR WO/CM 07/16/2020 IMPRESSION: 1. Coronary calcium score of 0. 2. Normal coronary origin with right dominance. 3. Significant stair step artifact due to cardiac motion, but the coronary arteries appear to be normal without evidence of CAD. 4. Dilated pulmonary artery suggestive of pulmonary hypertension. RECOMMENDATIONS: 1. No evidence of CAD (0%). Consider non-atherosclerotic causes of chest pain.  ECHOCARDIOGRAM 06/2009 (according to notes) Mod LVH, EF > 55, normal RV, normal filling pressures   History of Present Illness:   Chelsea Benson is a 71 y.o. female with the above problem list.  She has recently been treated for bronchitis with antibiotics.  She has a continued cough.  She saw her primary care provider recently and had a chest x-ray obtained.  This demonstrated stable cardiomegaly and no acute abnormality.  I personally reviewed the images.  She saw the results of her chest x-ray on my chart and was  concerned about cardiomegaly.  She scheduled this appointment today to review.  She has not had chest pain, significant shortness of breath, orthopnea or leg edema.  She has not had syncope.  She does have some neck discomfort but no exertional symptoms.      Past Medical History:  Diagnosis Date   Allergic rhinoconjunctivitis    Anginal pain (HCC)    Anxiety    Arthritis    Spondylolysis, knee and ankle arthritis pains.   Asthma    Bipolar disorder ([place])    Chronic back pain    Has had several surgeries.  He uses a walker   Depression    Diabetes mellitus without complication ([place])    dx in 2007   Diabetes mellitus, type II ([place])    Dysrhythmia    Eczema    Headache(784.0)    sinus related    History of kidney stones    History of nephrolithiasis    Hx of pulmonary embolus 2017   Hyperlipidemia    Hypertension    Insomnia    Memory change    OSA on CPAP    PSG 05/24/2016: AHI 17/hour 02 mean 76%.  HSAT 06/13/17, AHI 11-hour 02 mean 79%   Paroxysmal atrial fibrillation (HCC) 02/24/2021   CHA2DS2-VASc score 4 (HTN, DM, female, age-79); on Eliquis   Pneumonia    Tremor of both hands    Current Medications: Current Meds  Medication Sig   ACCU-CHEK GUIDE test strip TEST BID PRN   albuterol (PROVENTIL) (2.5 MG/3ML) 0.083% nebulizer solution USE 1 VIAL IN NEBULIZER EVERY 4 HOURS -  and as needed   albuterol (VENTOLIN HFA) 108 (90 Base) MCG/ACT inhaler USE 2 INHALATIONS BY MOUTH EVERY 4 HOURS AS NEEDED FOR WHEEZING  OR SHORTNESS OF BREATH   apixaban (ELIQUIS) 5 MG TABS tablet Take 1 tablet (5 mg total) by mouth 2 (two) times daily.   ARIPiprazole (ABILIFY) 2 MG tablet Take 2 tablets (4 mg total) by mouth daily.   azelastine (ASTELIN) 0.1 % nasal spray Place 2 sprays into both nostrils 2 (two) times daily. Use in each nostril as directed   Blood Glucose Monitoring Suppl (ACCU-CHEK GUIDE) w/Device KIT See admin instructions.   budesonide-formoterol (SYMBICORT) 160-4.5 MCG/ACT inhaler  INHALE 2 INHALATIONS BY MOUTH  INTO THE LUNGS IN THE MORNING  AND AT BEDTIME   busPIRone (BUSPAR) 30 MG tablet Take 1 tablet (30 mg total) by mouth 2 (two) times daily.   CALCIUM-VITAMIN D PO Take 1 tablet by mouth daily.   Carbinoxamine Maleate 4 MG TABS Take 1 tablet (4 mg total) by mouth in the morning and at bedtime.   clonazePAM (KLONOPIN) 0.5 MG tablet Take 1/2-1 tab po TID prn anxiety. (Do not combine with Ambien)   desonide (DESOWEN) 0.05 % ointment APPLY TO AFFECTED AREA(S)  TOPICALLY TWICE DAILY AS  NEEDED   diltiazem (CARDIZEM CD) 300 MG 24 hr capsule TAKE 1 CAPSULE BY MOUTH DAILY   diltiazem (CARDIZEM) 60 MG tablet TAKE DILITAZEM 60 MG BY MOUTH AS NEEDED FOR PALPITATIONS. YOU CAN TAKE EVERY 6 HOURS AS NEEDED UP TO TWICE DAILY WITH EACH EPISODE   EPINEPHrine 0.3 mg/0.3 mL IJ SOAJ injection INJECT INTRAMUSCULARLY 1  PEN AS NEEDED FOR ALLERGIC  RESPONSE AS DIRECTED BY MD. SEEK MEDICAL HELP AFTER  USE.   famotidine (PEPCID) 20 MG tablet TAKE 1 TABLET BY MOUTH DAILY  AFTER SUPPER   fluticasone (FLONASE) 50 MCG/ACT nasal spray USE 2 SPRAYS IN BOTH NOSTRILS  DAILY   hydrochlorothiazide (HYDRODIURIL) 25 MG tablet TAKE 1 TABLET BY MOUTH DAILY   meloxicam (MOBIC) 7.5 MG tablet Take 1 tablet (7.5 mg total) by mouth daily.   Mepolizumab (NUCALA) 100 MG/ML SOAJ Inject 1 mL (100 mg total) into the skin every 28 (twenty-eight) days.   metFORMIN (GLUCOPHAGE-XR) 500 MG 24 hr tablet Take 500 mg by mouth in the morning and at bedtime.   mirtazapine (REMERON) 30 MG tablet TAKE 1 TABLET BY MOUTH AT  BEDTIME   montelukast (SINGULAIR) 10 MG tablet Take 1 tablet (10 mg total) by mouth at bedtime.   Oxcarbazepine (TRILEPTAL) 300 MG tablet Take 2 tablets (600 mg total) by mouth at bedtime.   oxybutynin (DITROPAN-XL) 10 MG 24 hr tablet Take 10 mg by mouth daily.   pantoprazole (PROTONIX) 40 MG tablet TAKE 1 TABLET BY MOUTH  DAILY. TAKE 30 TO 60  MINUTES BEFORE FIRST MEAL  OF THE DAY   potassium chloride SA  (KLOR-CON M) 20 MEQ tablet TAKE 1 TABLET BY MOUTH DAILY   pravastatin (PRAVACHOL) 20 MG tablet Take 1 tablet (20 mg total) by mouth at bedtime.   pregabalin (LYRICA) 100 MG capsule Take 100 mg by mouth 3 (three) times daily.   Respiratory Therapy Supplies (NEBULIZER) DEVI Use as directed with nebulizer solution.   Respiratory Therapy Supplies (NEBULIZER/TUBING/MOUTHPIECE) KIT Use as directed with nebulizer machine   Semaglutide, 1 MG/DOSE, (OZEMPIC, 1 MG/DOSE,) 2 MG/1.5ML SOPN Take 1 mg by mouth once a week.   zolpidem (AMBIEN) 10 MG tablet TAKE 1/2 TO 1 TABLET BY  MOUTH AT BEDTIME AS NEEDED  FOR SLEEP    Allergies:   Ramipril, Cymbalta [duloxetine hcl], Methylprednisolone acetate, Rosuvastatin calcium, Almond meal, Almond oil, Carvedilol, Fish allergy, Losartan potassium, and Morphine and related   Social History   Tobacco Use   Smoking status: Never    Passive exposure: Yes   Smokeless tobacco: Never   Tobacco comments:    grandson smokes, but in his car  Vaping Use   Vaping Use: Never used  Substance Use Topics   Alcohol use: Yes    Comment: rare use   Drug use: No    Family Hx: The patient's family history includes Alcohol abuse in her father; Alcoholism in her father; Asthma in her daughter; Cancer in her daughter and maternal grandmother; Depression in her daughter and daughter; Diabetes in her brother, mother, and sister; Heart failure in her maternal aunt and maternal grandmother; Hypertension in her brother, daughter, and paternal grandmother; Pneumonia in her mother; Schizophrenia in her grandchild. There is no history of Allergies, Eczema, or Immunodeficiency.  Review of Systems  Gastrointestinal:  Negative for hematochezia.  Genitourinary:  Negative for hematuria.     EKGs/Labs/Other Test Reviewed:    EKG:  EKG is not ordered today.  The ekg ordered today demonstrates n/a  Recent Labs: 12/23/2021: BUN 16; Creatinine, Ser 0.82; Hemoglobin 12.5; Platelets 297; Potassium  3.6; Sodium 142   Recent Lipid Panel No results for input(s): "CHOL", "TRIG", "HDL", "VLDL", "LDLCALC", "LDLDIRECT" in the last 8760 hours.   Risk Assessment/Calculations/Metrics:    CHA2DS2-VASc Score = 4   This indicates a 4.8% annual risk of stroke. The patient's score is based upon: CHF History: 0 HTN History: 1 Diabetes History: 1 Stroke History: 0 Vascular Disease History: 0 Age Score: 1 Gender Score: 1             Physical Exam:    VS:  BP 100/70   Pulse 90   Ht 5' 3"  (1.6 m)   Wt 169 lb (76.7 kg)   SpO2 98%   BMI 29.94 kg/m     Wt Readings from Last 3 Encounters:  07/20/22 169 lb (76.7 kg)  04/21/22 168 lb (76.2 kg)  01/03/22 166 lb (75.3 kg)    Constitutional:      Appearance: Healthy appearance. Not in distress.  Neck:     Vascular: JVD normal.  Pulmonary:     Effort: Pulmonary effort is normal.     Breath sounds: No wheezing. No rales.  Cardiovascular:     Normal rate. Regular rhythm. Normal S1. Normal S2.      Murmurs: There is no murmur.  Edema:    Peripheral edema absent.  Abdominal:     Palpations: Abdomen is soft.  Skin:    General: Skin is warm and dry.  Neurological:     Mental Status: Alert and oriented to person, place and time.         ASSESSMENT & PLAN:   Cardiomegaly Cardiomegaly noted on recent chest x-ray.  We discussed the potential causes of cardiomegaly on x-ray.  She does have a long history of hypertension.  She is certainly at risk for hypertensive heart disease.  Her coronary CTA in 2021 demonstrated dilated pulmonary artery suggestive of pulmonary hypertension.  I have recommended that we proceed with a 2D echocardiogram to further evaluate.  Follow-up as previously planned.  Paroxysmal atrial fibrillation ([place]); CHA2DS2-VASc score 4 HTN, DM, female, age)-on Eliquis She has been maintaining sinus rhythm.  She is tolerating anticoagulation and remains on Eliquis in  addition to diltiazem for rate control.  Essential  hypertension Blood pressure is currently controlled on a combination of diltiazem, HCTZ.           Dispo:  Return for Scheduled Follow Up with Dr. Ellyn Hack.   Medication Adjustments/Labs and Tests Ordered: Current medicines are reviewed at length with the patient today.  Concerns regarding medicines are outlined above.  Tests Ordered: Orders Placed This Encounter  Procedures   ECHOCARDIOGRAM COMPLETE   Medication Changes: No orders of the defined types were placed in this encounter.  Signed, Richardson Dopp, PA-C  07/20/2022 5:18 PM    [address], [place], [place]  [address] Phone: [phone]; Fax: [phone]

## 2022-07-20 NOTE — Patient Instructions (Addendum)
Medication Instructions:  Your physician recommends that you continue on your current medications as directed. Please refer to the Current Medication list given to you today.  *If you need a refill on your cardiac medications before your next appointment, please call your pharmacy*   Lab Work: None ordered  If you have labs (blood work) drawn today and your tests are completely normal, you will receive your results only by: MyChart Message (if you have MyChart) OR A paper copy in the mail If you have any lab test that is abnormal or we need to change your treatment, we will call you to review the results.   Testing/Procedures: Your physician has requested that you have an echocardiogram. Echocardiography is a painless test that uses sound waves to create images of your heart. It provides your doctor with information about the size and shape of your heart and how well your heart's chambers and valves are working. This procedure takes approximately one hour. There are no restrictions for this procedure.    Follow-Up: At [facility], you and your health needs are our priority.  As part of our continuing mission to provide you with exceptional heart care, we have created designated Provider Care Teams.  These Care Teams include your primary Cardiologist (physician) and Advanced Practice Providers (APPs -  Physician Assistants and Nurse Practitioners) who all work together to provide you with the care you need, when you need it.  We recommend signing up for the patient portal called "MyChart".  Sign up information is provided on this After Visit Summary.  MyChart is used to connect with patients for Virtual Visits (Telemedicine).  Patients are able to view lab/test results, encounter notes, upcoming appointments, etc.  Non-urgent messages can be sent to your provider as well.   To learn more about what you can do with MyChart, go to [email or web address].    Your next appointment:   As  scheduled  The format for your next appointment:   In Person  Provider:   Bryan Lemma, MD     Other Instructions   Important Information About Sugar

## 2022-07-21 ENCOUNTER — Other Ambulatory Visit: Payer: Self-pay

## 2022-07-22 ENCOUNTER — Telehealth (INDEPENDENT_AMBULATORY_CARE_PROVIDER_SITE_OTHER): Payer: 59 | Admitting: Psychiatry

## 2022-07-22 ENCOUNTER — Encounter: Payer: Self-pay | Admitting: Psychiatry

## 2022-07-22 DIAGNOSIS — F410 Panic disorder [episodic paroxysmal anxiety] without agoraphobia: Secondary | ICD-10-CM | POA: Diagnosis not present

## 2022-07-22 DIAGNOSIS — F3181 Bipolar II disorder: Secondary | ICD-10-CM | POA: Diagnosis not present

## 2022-07-22 DIAGNOSIS — F419 Anxiety disorder, unspecified: Secondary | ICD-10-CM | POA: Diagnosis not present

## 2022-07-22 DIAGNOSIS — F5101 Primary insomnia: Secondary | ICD-10-CM

## 2022-07-22 MED ORDER — CLONAZEPAM 0.5 MG PO TABS: ORAL_TABLET | ORAL | 1 refills | Status: DC

## 2022-07-22 MED ORDER — OXCARBAZEPINE 300 MG PO TABS: 600.0000 mg | ORAL_TABLET | Freq: Every day | ORAL | 1 refills | Status: DC

## 2022-07-22 MED ORDER — ARIPIPRAZOLE 2 MG PO TABS: 4.0000 mg | ORAL_TABLET | Freq: Every day | ORAL | 1 refills | Status: DC

## 2022-07-22 MED ORDER — ZOLPIDEM TARTRATE 10 MG PO TABS: 5.0000 mg | ORAL_TABLET | Freq: Every evening | ORAL | 1 refills | Status: DC | PRN

## 2022-07-22 NOTE — Progress Notes (Signed)
Chelsea Benson [id number] 03/24/1951 71 y.o.  Virtual Visit via Video Note  I connected with pt @ on 07/22/22 at  9:00 AM EDT by a video enabled telemedicine application and verified that I am speaking with the correct person using two identifiers.   I discussed the limitations of evaluation and management by telemedicine and the availability of in person appointments. The patient expressed understanding and agreed to proceed.  I discussed the assessment and treatment plan with the patient. The patient was provided an opportunity to ask questions and all were answered. The patient agreed with the plan and demonstrated an understanding of the instructions.   The patient was advised to call back or seek an in-person evaluation if the symptoms worsen or if the condition fails to improve as anticipated.  I provided 25 minutes of non-face-to-face time during this encounter.  The patient was located at home.  The provider was located at [place].   Thayer Headings, PMHNP   Subjective:   Patient ID:  Chelsea Benson is a 71 y.o. (DOB 1951/06/17) female.  Chief Complaint:  Chief Complaint  Patient presents with   Follow-up    Anxiety, mood disturbance, and insomnia    HPI Chelsea Benson presents for follow-up of anxiety, mood disturbance, and insomnia.   She reports that she had cataract surgery yesterday. Has surgery scheduled for the cataracts on her other eye in early September.    She reports that she had a stressful situation with her daughter- "but I'm getting through it alright" and was able to manage anxiety during this time. She reports, "I was surprised."  She reports that she had a brief period of increased anxiety, tearfulness,  and depression and realized she had been forgetting to take her psychiatric medications. She reports "I got back on track" and symptoms resolved. Denies depressed mood. She reports that she had some irritability with missed medication  and "I'm good now." She reports that she is sleeping well and averaging 9 hours a night. Energy and motivation have been good. Concentration has been good. Denies impulsive or risky behavior. Denies SI.  She has been celebrating her birthday and having people over. She has enjoyed a Estate manager/land agent and is looking forward to this.    Past Psychiatric Medication Trials: Cymbalta-hair loss Mirtazapine Prozac Trazodone-ineffective Ambien BuSpar Hydroxyzine Abilify Trileptal Xanax  Review of Systems:  Review of Systems  Musculoskeletal:  Positive for gait problem.  Neurological:  Negative for tremors.  Psychiatric/Behavioral:         Please refer to HPI    Medications: I have reviewed the patient's current medications.  Current Outpatient Medications  Medication Sig Dispense Refill   ACCU-CHEK GUIDE test strip TEST BID PRN  1   albuterol (PROVENTIL) (2.5 MG/3ML) 0.083% nebulizer solution USE 1 VIAL IN NEBULIZER EVERY 4 HOURS - and as needed 75 mL 0   albuterol (VENTOLIN HFA) 108 (90 Base) MCG/ACT inhaler USE 2 INHALATIONS BY MOUTH EVERY 4 HOURS AS NEEDED FOR WHEEZING  OR SHORTNESS OF BREATH 26.8 g 4   apixaban (ELIQUIS) 5 MG TABS tablet Take 1 tablet (5 mg total) by mouth 2 (two) times daily. 180 tablet 1   ARIPiprazole (ABILIFY) 2 MG tablet Take 2 tablets (4 mg total) by mouth daily. 180 tablet 1   azelastine (ASTELIN) 0.1 % nasal spray Place 2 sprays into both nostrils 2 (two) times daily. Use in each nostril as directed 30 mL 5   Blood Glucose Monitoring Suppl (  ACCU-CHEK GUIDE) w/Device KIT See admin instructions.  1   budesonide-formoterol (SYMBICORT) 160-4.5 MCG/ACT inhaler INHALE 2 INHALATIONS BY MOUTH  INTO THE LUNGS IN THE MORNING  AND AT BEDTIME 10.2 g 11   busPIRone (BUSPAR) 30 MG tablet Take 1 tablet (30 mg total) by mouth 2 (two) times daily. 180 tablet 2   CALCIUM-VITAMIN D PO Take 1 tablet by mouth daily.     Carbinoxamine Maleate 4 MG TABS Take 1 tablet (4 mg total) by  mouth in the morning and at bedtime. 180 tablet 1   clonazePAM (KLONOPIN) 0.5 MG tablet Take 1/2-1 tab po TID prn anxiety. (Do not combine with Ambien) 90 tablet 1   desonide (DESOWEN) 0.05 % ointment APPLY TO AFFECTED AREA(S)  TOPICALLY TWICE DAILY AS  NEEDED 180 g 1   diltiazem (CARDIZEM CD) 300 MG 24 hr capsule TAKE 1 CAPSULE BY MOUTH DAILY 90 capsule 3   diltiazem (CARDIZEM) 60 MG tablet TAKE DILITAZEM 60 MG BY MOUTH AS NEEDED FOR PALPITATIONS. YOU CAN TAKE EVERY 6 HOURS AS NEEDED UP TO TWICE DAILY WITH EACH EPISODE 60 tablet 7   EPINEPHrine 0.3 mg/0.3 mL IJ SOAJ injection INJECT INTRAMUSCULARLY 1  PEN AS NEEDED FOR ALLERGIC  RESPONSE AS DIRECTED BY MD. SEEK MEDICAL HELP AFTER  USE. 2 each 0   famotidine (PEPCID) 20 MG tablet TAKE 1 TABLET BY MOUTH DAILY  AFTER SUPPER 100 tablet 2   fluticasone (FLONASE) 50 MCG/ACT nasal spray USE 2 SPRAYS IN BOTH NOSTRILS  DAILY 48 g 1   hydrochlorothiazide (HYDRODIURIL) 25 MG tablet TAKE 1 TABLET BY MOUTH DAILY 90 tablet 0   meloxicam (MOBIC) 7.5 MG tablet Take 1 tablet (7.5 mg total) by mouth daily. 30 tablet 0   Mepolizumab (NUCALA) 100 MG/ML SOAJ Inject 1 mL (100 mg total) into the skin every 28 (twenty-eight) days. 1 mL 11   metFORMIN (GLUCOPHAGE-XR) 500 MG 24 hr tablet Take 500 mg by mouth in the morning and at bedtime.     mirtazapine (REMERON) 30 MG tablet TAKE 1 TABLET BY MOUTH AT  BEDTIME 90 tablet 3   montelukast (SINGULAIR) 10 MG tablet Take 1 tablet (10 mg total) by mouth at bedtime. 30 tablet 5   Oxcarbazepine (TRILEPTAL) 300 MG tablet Take 2 tablets (600 mg total) by mouth at bedtime. 180 tablet 1   oxybutynin (DITROPAN-XL) 10 MG 24 hr tablet Take 10 mg by mouth daily.     pantoprazole (PROTONIX) 40 MG tablet TAKE 1 TABLET BY MOUTH  DAILY. TAKE 30 TO 60  MINUTES BEFORE FIRST MEAL  OF THE DAY 90 tablet 3   potassium chloride SA (KLOR-CON M) 20 MEQ tablet TAKE 1 TABLET BY MOUTH DAILY 100 tablet 2   pravastatin (PRAVACHOL) 20 MG tablet Take 1  tablet (20 mg total) by mouth at bedtime. 90 tablet 3   pregabalin (LYRICA) 100 MG capsule Take 100 mg by mouth 3 (three) times daily.     Respiratory Therapy Supplies (NEBULIZER) DEVI Use as directed with nebulizer solution. 1 each 1   Respiratory Therapy Supplies (NEBULIZER/TUBING/MOUTHPIECE) KIT Use as directed with nebulizer machine 1 kit 12   Semaglutide, 1 MG/DOSE, (OZEMPIC, 1 MG/DOSE,) 2 MG/1.5ML SOPN Take 1 mg by mouth once a week.     [START ON 07/26/2022] zolpidem (AMBIEN) 10 MG tablet Take 0.5-1 tablets (5-10 mg total) by mouth at bedtime as needed. for sleep 90 tablet 1   No current facility-administered medications for this visit.    Medication Side  Effects: None  Allergies:  Allergies  Allergen Reactions   Ramipril Other (See Comments)   Cymbalta [Duloxetine Hcl] Other (See Comments)    Caused hair loss   Methylprednisolone Acetate Hives and Rash    05/29/20 developed hives "all over my back" after MBB; resolved with Benadryl and "a few days."  Can tolerate Dexamethasone/Decadron. 05/29/20 developed hives "all over my back" after MBB; resolved with Benadryl and "a few days."  Can tolerate Dexamethasone/Decadron.   Rosuvastatin Calcium Other (See Comments) and Cough    Rapid heart rate   Almond Meal Itching and Swelling    Tongue swells and itches   Almond Oil Itching and Swelling    Itching and swelling of tongue    Carvedilol Cough   Fish Allergy Itching    Halibut fish   Losartan Potassium Cough   Morphine And Related Itching    Past Medical History:  Diagnosis Date   Allergic rhinoconjunctivitis    Anginal pain (HCC)    Anxiety    Arthritis    Spondylolysis, knee and ankle arthritis pains.   Asthma    Bipolar disorder ([place])    Chronic back pain    Has had several surgeries.  He uses a walker   Depression    Diabetes mellitus without complication ([place])    dx in 2007   Diabetes mellitus, type II ([place])    Dysrhythmia    Eczema    Headache(784.0)     sinus related    History of kidney stones    History of nephrolithiasis    Hx of pulmonary embolus 2017   Hyperlipidemia    Hypertension    Insomnia    Memory change    OSA on CPAP    PSG 05/24/2016: AHI 17/hour 02 mean 76%.  HSAT 06/13/17, AHI 11-hour 02 mean 79%   Paroxysmal atrial fibrillation ([place]) 02/24/2021   CHA2DS2-VASc score 4 (HTN, DM, female, age-86); on Eliquis   Pneumonia    Tremor of both hands     Family History  Problem Relation Age of Onset   Diabetes Sister    Diabetes Brother    Hypertension Brother    Cancer Maternal Grandmother    Heart failure Maternal Grandmother    Hypertension Paternal Grandmother    Asthma Daughter    Cancer Daughter    Depression Daughter    Hypertension Daughter    Heart failure Maternal Aunt    Pneumonia Mother    Diabetes Mother    Alcoholism Father    Alcohol abuse Father    Depression Daughter    Schizophrenia Grandchild    Allergies Neg Hx    Eczema Neg Hx    Immunodeficiency Neg Hx     Social History   Socioeconomic History   Marital status: Single    Spouse name: Not on file   Number of children: 2   Years of education: Not on file   Highest education level: Some college, no degree  Occupational History    Comment: NA  Tobacco Use   Smoking status: Never    Passive exposure: Yes   Smokeless tobacco: Never   Tobacco comments:    grandson smokes, but in his car  Vaping Use   Vaping Use: Never used  Substance and Sexual Activity   Alcohol use: Yes    Comment: rare use   Drug use: No   Sexual activity: Not Currently  Other Topics Concern   Not on file  Social History Narrative  She is currently single.  Has 2 girls.  Has some college education.  Currently disabled due to chronic back pain and not employed.   07/01/19 lives with brother Shanon Brow   Caffeine, none   Social Determinants of Health   Financial Resource Strain: Not on file  Food Insecurity: Not on file  Transportation Needs: Not on file   Physical Activity: Not on file  Stress: Not on file  Social Connections: Not on file  Intimate Partner Violence: Not on file    Past Medical History, Surgical history, Social history, and Family history were reviewed and updated as appropriate.   Please see review of systems for further details on the patient's review from today.   Objective:   Physical Exam:  There were no vitals taken for this visit.  Physical Exam Neurological:     Mental Status: She is alert and oriented to person, place, and time.     Cranial Nerves: No dysarthria.  Psychiatric:        Attention and Perception: Attention and perception normal.        Mood and Affect: Mood normal.        Speech: Speech normal.        Behavior: Behavior is cooperative.        Thought Content: Thought content normal. Thought content is not paranoid or delusional. Thought content does not include homicidal or suicidal ideation. Thought content does not include homicidal or suicidal plan.        Cognition and Memory: Cognition and memory normal.        Judgment: Judgment normal.     Comments: Insight intact     Lab Review:     Component Value Date/Time   NA 142 12/23/2021 1116   NA 139 07/06/2020 0834   K 3.6 12/23/2021 1116   CL 106 12/23/2021 1116   CO2 28 12/23/2021 1116   GLUCOSE 149 (H) 12/23/2021 1116   BUN 16 12/23/2021 1116   BUN 13 07/06/2020 0834   CREATININE 0.82 12/23/2021 1116   CALCIUM 10.0 12/23/2021 1116   PROT 7.3 04/28/2019 1712   ALBUMIN 4.2 04/28/2019 1712   AST 21 04/28/2019 1712   ALT 30 04/28/2019 1712   ALKPHOS 68 04/28/2019 1712   BILITOT 0.5 04/28/2019 1712   GFRNONAA >60 12/23/2021 1116   GFRAA 108 07/06/2020 0834       Component Value Date/Time   WBC 4.2 12/23/2021 1116   RBC 4.74 12/23/2021 1116   HGB 12.5 12/23/2021 1116   HGB 12.5 11/08/2018 1041   HCT 40.8 12/23/2021 1116   HCT 39.5 11/08/2018 1041   PLT 297 12/23/2021 1116   MCV 86.1 12/23/2021 1116   MCV 81 11/08/2018  1041   MCH 26.4 12/23/2021 1116   MCHC 30.6 12/23/2021 1116   RDW 15.7 (H) 12/23/2021 1116   RDW 14.3 11/08/2018 1041   LYMPHSABS 2.1 04/28/2019 1712   LYMPHSABS 1.4 11/08/2018 1041   MONOABS 0.5 04/28/2019 1712   EOSABS 0.3 04/28/2019 1712   EOSABS 0.2 11/08/2018 1041   BASOSABS 0.0 04/28/2019 1712   BASOSABS 0.0 11/08/2018 1041    No results found for: "POCLITH", "LITHIUM"   No results found for: "PHENYTOIN", "PHENOBARB", "VALPROATE", "CBMZ"   .res Assessment: Plan:   Will continue current plan of care since target signs and symptoms are well controlled without any tolerability issues. Continue Abilify 4 mg po qd for mood s/s.  Continue Buspar 30 mg po BID for anxiety. Continue Klonopin 0.5  mg 1/2-1 tab prn panic. Continue Dayvigo 10 mg at bedtime for insomnia.  Continue Remeron 30 mg po qhs for depression and insomnia.  Continue Trileptal 600 mg po QHS for mood stabilization and anxiety.  Continue Ambien 10 mg 1/2-1 tab po QHS prn insomnia.  Recommend continuing therapy with Rinaldo Cloud, LCSW.  Pt to follow-up in 4 months or sooner if clinically indicated.  Patient advised to contact office with any questions, adverse effects, or acute worsening in signs and symptoms.    Ohanna was seen today for follow-up.  Diagnoses and all orders for this visit:  Bipolar II disorder ([place]) -     ARIPiprazole (ABILIFY) 2 MG tablet; Take 2 tablets (4 mg total) by mouth daily.  Panic disorder -     clonazePAM (KLONOPIN) 0.5 MG tablet; Take 1/2-1 tab po TID prn anxiety. (Do not combine with Ambien)  Primary insomnia -     zolpidem (AMBIEN) 10 MG tablet; Take 0.5-1 tablets (5-10 mg total) by mouth at bedtime as needed. for sleep  Anxiety disorder, unspecified type -     Oxcarbazepine (TRILEPTAL) 300 MG tablet; Take 2 tablets (600 mg total) by mouth at bedtime.     Please see After Visit Summary for patient specific instructions.  Future Appointments  Date Time Provider  [place]  08/03/2022  9:20 AM [facility] ECHO 2 [id number]  08/10/2022 10:00 AM Shanon Ace, LCSW [facility] None  10/03/2022  3:00 PM Leonie Man, MD [facility]  11/02/2022 10:00 AM Kennith Gain, MD [facility] None    No orders of the defined types were placed in this encounter.     -------------------------------

## 2022-08-01 ENCOUNTER — Telehealth: Payer: Self-pay | Admitting: Psychiatry

## 2022-08-01 NOTE — Telephone Encounter (Signed)
Patient said she got in an argument with her daughter and she won't let her see her grandchildren. She said yesterday she took 4 clonazepam. She said that is the only time she has taken extra so far.  Please advise.

## 2022-08-01 NOTE — Telephone Encounter (Signed)
Patient lvm at 10:50 regarding problem with her anxiety. She is unsure if this is due to issues with her daughter but she is having to take more pills during the day. She is unsure what she should do. Pls rtc [phone]

## 2022-08-02 NOTE — Telephone Encounter (Signed)
Is she still experiencing severe anxiety? If so, she may try taking 1/2 of oxcarbazepine in the morning since that helped quickly with her anxiety in the past. She could then go back to her normal dose when stressor resolves. Please advise her that dizziness can sometimes happen with higher doses of Oxcarbazepine and that she should stop morning dose if she starts feeling dizzy and/or unsteady.

## 2022-08-02 NOTE — Telephone Encounter (Signed)
Notified patient of recommendations.

## 2022-08-03 ENCOUNTER — Encounter (HOSPITAL_COMMUNITY): Payer: Self-pay | Admitting: Physician Assistant

## 2022-08-03 ENCOUNTER — Ambulatory Visit (HOSPITAL_COMMUNITY): Payer: 59 | Attending: Cardiology

## 2022-08-06 ENCOUNTER — Other Ambulatory Visit: Payer: Self-pay | Admitting: Allergy

## 2022-08-06 DIAGNOSIS — T7800XD Anaphylactic reaction due to unspecified food, subsequent encounter: Secondary | ICD-10-CM

## 2022-08-09 ENCOUNTER — Ambulatory Visit (INDEPENDENT_AMBULATORY_CARE_PROVIDER_SITE_OTHER): Payer: 59 | Admitting: Family Medicine

## 2022-08-09 ENCOUNTER — Encounter: Payer: Self-pay | Admitting: Family Medicine

## 2022-08-09 ENCOUNTER — Telehealth: Payer: 59 | Admitting: Family Medicine

## 2022-08-09 DIAGNOSIS — J3089 Other allergic rhinitis: Secondary | ICD-10-CM | POA: Diagnosis not present

## 2022-08-09 DIAGNOSIS — L2089 Other atopic dermatitis: Secondary | ICD-10-CM | POA: Diagnosis not present

## 2022-08-09 DIAGNOSIS — H1013 Acute atopic conjunctivitis, bilateral: Secondary | ICD-10-CM | POA: Diagnosis not present

## 2022-08-09 DIAGNOSIS — J4550 Severe persistent asthma, uncomplicated: Secondary | ICD-10-CM | POA: Diagnosis not present

## 2022-08-09 DIAGNOSIS — T7800XD Anaphylactic reaction due to unspecified food, subsequent encounter: Secondary | ICD-10-CM

## 2022-08-09 MED ORDER — FLUTICASONE PROPIONATE HFA 110 MCG/ACT IN AERO: INHALATION_SPRAY | RESPIRATORY_TRACT | 0 refills | Status: DC

## 2022-08-09 MED ORDER — EPINEPHRINE 0.3 MG/0.3ML IJ SOAJ: INTRAMUSCULAR | 0 refills | Status: DC

## 2022-08-09 MED ORDER — CARBINOXAMINE MALEATE 4 MG PO TABS
1.0000 | ORAL_TABLET | Freq: Three times a day (TID) | ORAL | 1 refills | Status: DC | PRN
Start: 2022-08-09 — End: 2024-04-01

## 2022-08-09 NOTE — Patient Instructions (Addendum)
Asthma Begin Flovent 44-4 puffs twice a day with a spacer for the next 2 weeks or until cough and wheeze free (insurance would not cover Flovent 110 so we have changed the inhaler to Flovent 44 and increased the puffs to 4 puffs twice a day) Continue montelukast 10 mg once a day to prevent cough or wheeze Continue Symbicort 160-2 puffs twice a day to prevent cough or wheeze For the next 1 week, pretreat with albuterol before using Symbicort and Flovent.  Then use albuterol only as needed Continue Nucala injections once every 4 weeks  Allergic rhinitis/allergic conjunctivitis Continue allergen avoidance measures directed toward pollens, mold, pet, dust mite, cockroach, and mouse urine as listed below Increase carbinoxamine 4 mg to 2-3 times a day as needed for nasal symptoms Begin Mucinex 600 to 1200 mg twice a day and increase fluid intake in order to thin out mucus Continue Flonase 2 sprays in each nostril once a day as needed for stuffy nose Continue azelastine 2 sprays in each nostril twice a day as needed for runny nose Continue montelukast 10 mg once a day as listed above Continue a lubricating eyedrop as needed  Food allergy Continue to avoid almonds.  In case of an allergic reaction, take Benadryl 50 mg every 4 hours, and if life-threatening symptoms occur, inject with EpiPen 0.3 mg.  Atopic dermatitis Continue a twice a day moisturizing routine with Aveeno Continue triamcinolone 0.1% ointment up to red and itchy areas below your face up to twice a day as needed  Reflux Continue dietary and lifestyle modifications as listed below Continue Protonix 40 mg once a day as previously prescribed  Call the clinic if this treatment plan is not working well for you.  Follow up in the clinic in 1 month or sooner if needed.  Reducing Pollen Exposure The American Academy of Allergy, Asthma and Immunology suggests the following steps to reduce your exposure to pollen during allergy  seasons. Do not hang sheets or clothing out to dry; pollen may collect on these items. Do not mow lawns or spend time around freshly cut grass; mowing stirs up pollen. Keep windows closed at night.  Keep car windows closed while driving. Minimize morning activities outdoors, a time when pollen counts are usually at their highest. Stay indoors as much as possible when pollen counts or humidity is high and on windy days when pollen tends to remain in the air longer. Use air conditioning when possible.  Many air conditioners have filters that trap the pollen spores. Use a HEPA room air filter to remove pollen form the indoor air you breathe.  Control of Mold Allergen Mold and fungi can grow on a variety of surfaces provided certain temperature and moisture conditions exist.  Outdoor molds grow on plants, decaying vegetation and soil.  The major outdoor mold, Alternaria and Cladosporium, are found in very high numbers during hot and dry conditions.  Generally, a late Summer - Fall peak is seen for common outdoor fungal spores.  Rain will temporarily lower outdoor mold spore count, but counts rise rapidly when the rainy period ends.  The most important indoor molds are Aspergillus and Penicillium.  Dark, humid and poorly ventilated basements are ideal sites for mold growth.  The next most common sites of mold growth are the bathroom and the kitchen.  Outdoor [employer] Use air conditioning and keep windows closed Avoid exposure to decaying vegetation. Avoid leaf raking. Avoid grain handling. Consider wearing a face mask if working in  moldy areas.  Indoor Mold Control Maintain humidity below 50%. Clean washable surfaces with 5% bleach solution. Remove sources e.g. Contaminated carpets.   Control of Dust Mite Allergen Dust mites play a major role in allergic asthma and rhinitis. They occur in environments with high humidity wherever human skin is found. Dust mites absorb humidity from the  atmosphere (ie, they do not drink) and feed on organic matter (including shed human and animal skin). Dust mites are a microscopic type of insect that you cannot see with the naked eye. High levels of dust mites have been detected from mattresses, pillows, carpets, upholstered furniture, bed covers, clothes, soft toys and any woven material. The principal allergen of the dust mite is found in its feces. A gram of dust may contain 1,000 mites and 250,000 fecal particles. Mite antigen is easily measured in the air during house cleaning activities. Dust mites do not bite and do not cause harm to humans, other than by triggering allergies/asthma.  Ways to decrease your exposure to dust mites in your home:  1. Encase mattresses, box springs and pillows with a mite-impermeable barrier or cover  2. Wash sheets, blankets and drapes weekly in hot water (130 F) with detergent and dry them in a dryer on the hot setting.  3. Have the room cleaned frequently with a vacuum cleaner and a damp dust-mop. For carpeting or rugs, vacuuming with a vacuum cleaner equipped with a high-efficiency particulate air (HEPA) filter. The dust mite allergic individual should not be in a room which is being cleaned and should wait 1 hour after cleaning before going into the room.  4. Do not sleep on upholstered furniture (eg, couches).  5. If possible removing carpeting, upholstered furniture and drapery from the home is ideal. Horizontal blinds should be eliminated in the rooms where the person spends the most time (bedroom, study, television room). Washable vinyl, roller-type shades are optimal.  6. Remove all non-washable stuffed toys from the bedroom. Wash stuffed toys weekly like sheets and blankets above.  7. Reduce indoor humidity to less than 50%. Inexpensive humidity monitors can be purchased at most hardware stores. Do not use a humidifier as can make the problem worse and are not recommended.  Control of Dog or Cat  Allergen Avoidance is the best way to manage a dog or cat allergy. If you have a dog or cat and are allergic to dog or cats, consider removing the dog or cat from the home. If you have a dog or cat but don't want to find it a new home, or if your family wants a pet even though someone in the household is allergic, here are some strategies that may help keep symptoms at bay:  Keep the pet out of your bedroom and restrict it to only a few rooms. Be advised that keeping the dog or cat in only one room will not limit the allergens to that room. Don't pet, hug or kiss the dog or cat; if you do, wash your hands with soap and water. High-efficiency particulate air (HEPA) cleaners run continuously in a bedroom or living room can reduce allergen levels over time. Regular use of a high-efficiency vacuum cleaner or a central vacuum can reduce allergen levels. Giving your dog or cat a bath at least once a week can reduce airborne allergen.  Control of Cockroach Allergen Cockroach allergen has been identified as an important cause of acute attacks of asthma, especially in urban settings.  There are fifty-five species of  cockroach that exist in the [place], however only three, the [place], [place] species produce allergen that can affect patients with Asthma.  Allergens can be obtained from fecal particles, egg casings and secretions from cockroaches.    Remove food sources. Reduce access to water. Seal access and entry points. Spray runways with 0.5-1% Diazinon or Chlorpyrifos Blow boric acid power under stoves and refrigerator. Place bait stations (hydramethylnon) at feeding sites.

## 2022-08-09 NOTE — Progress Notes (Signed)
RE: Chelsea Benson MRN: [id number] DOB: 1951-11-08 Date of Telemedicine Visit: 08/09/2022  Referring provider: Marda Stalker, PA-C Primary care provider: Marda Stalker, PA-C  Chief Complaint: Nasal Congestion and Cough   Telemedicine Follow Up Visit via Telephone: I connected with Chelsea Benson for a follow up on 08/09/22 by telephone and verified that I am speaking with the correct person using two identifiers.   I discussed the limitations, risks, security and privacy concerns of performing an evaluation and management service by telephone and the availability of in person appointments. I also discussed with the patient that there may be a patient responsible charge related to this service. The patient expressed understanding and agreed to proceed.  Patient is at home  Provider is at the office.  Visit start time: 31 Visit end time: 43 Insurance consent/check in by: Angela Nevin Medical consent and medical assistant/nurse: Vallarie Mare  History of Present Illness: She is a 71 y.o. 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 Her previous allergy office visit was on 04/21/2022 with Dr. Nelva Bush.  In the interim, she visited the emergency department on 06/23/2022 for evaluation of cough, chest congestion, and back pain.  At that time she had a chest x-ray indicating mild streaky bibasilar opacities.  No confluent consolidation, no visible pleural effusion or pneumothorax.  She was prescribed Augmentin, prednisone, and benzonatate with relief of symptoms.  She reports that she went to her primary care provider about 2 weeks ago for evaluation of cough, mucus, and shortness of breath where she had an additional chest x-ray on 07/18/2022 indicating no focal consolidations or pleural effusion.  Minimal streaky opacities at the lung bases consistent with atelectasis or scarring were noted again.  At today's visit,  she reports that she began to experience symptoms about 1 month ago which improved with prednisone, antibiotic, and Tessalon Perles.  She reports that for the last week she has been experiencing some shortness of breath, occasional wheeze, and cough producing clear to white mucus.  She continues montelukast 10 mg once a day, Symbicort 160-2 puffs twice a day, and uses albuterol frequently over the last week with moderate relief of symptoms.  Reflux is reported as well controlled with no symptoms including heartburn or vomiting.  She continues Protonix 40 mg once a day.  Allergic rhinitis is reported as poorly controlled with symptoms occurring over the last 2 weeks including clear to white rhinorrhea, nasal congestion, sneezing, and postnasal drainage.  She continues Flonase daily, azelastine twice a day, nasal saline rinses daily, and carbinoxamine 4 mg once a day.  Allergic conjunctivitis is reported as moderately well controlled with occasional red and itchy eyes.  She does report that she had cataract surgery just under 2 weeks ago and is ready to undergo an additional cataract surgery next week.  She is currently not using an allergy eyedrop and uses lubricating eyedrops as needed.  Atopic dermatitis is reported as moderately well controlled with red and itchy areas mainly occurring on her back.  She continues a moisturizing routine utilizing Aveeno twice a day and uses triamcinolone as needed for relief of symptoms.  She continues to avoid almonds with no accidental ingestion or EpiPen use since her last visit to this clinic.  Her current medications are listed in the chart.   Assessment and Plan: Chelsea Benson is a 71 y.o. female with: Patient Instructions  Asthma Begin Flovent 44-4 puffs twice a day with a spacer for  the next 2 weeks or until cough and wheeze free (insurance would not cover Flovent 110 so we have changed the inhaler to Flovent 44 and increased the puffs to 4 puffs twice a day) Continue  montelukast 10 mg once a day to prevent cough or wheeze Continue Symbicort 160-2 puffs twice a day to prevent cough or wheeze For the next 1 week, pretreat with albuterol before using Symbicort and Flovent.  Then use albuterol only as needed Continue Nucala injections once every 4 weeks  Allergic rhinitis/allergic conjunctivitis Continue allergen avoidance measures directed toward pollens, mold, pet, dust mite, cockroach, and mouse urine as listed below Increase carbinoxamine 4 mg to 2-3 times a day as needed for nasal symptoms Begin Mucinex 600 to 1200 mg twice a day and increase fluid intake in order to thin out mucus Continue Flonase 2 sprays in each nostril once a day as needed for stuffy nose Continue azelastine 2 sprays in each nostril twice a day as needed for runny nose Continue montelukast 10 mg once a day as listed above Continue a lubricating eyedrop as needed  Food allergy Continue to avoid almonds.  In case of an allergic reaction, take Benadryl 50 mg every 4 hours, and if life-threatening symptoms occur, inject with EpiPen 0.3 mg.  Atopic dermatitis Continue a twice a day moisturizing routine with Aveeno Continue triamcinolone 0.1% ointment up to red and itchy areas below your face up to twice a day as needed  Reflux Continue dietary and lifestyle modifications as listed below Continue Protonix 40 mg once a day as previously prescribed  Call the clinic if this treatment plan is not working well for you.  Follow up in 1 month or sooner if needed.    Return in about 4 weeks (around 09/06/2022), or if symptoms worsen or fail to improve.  Meds ordered this encounter  Medications   fluticasone (FLOVENT HFA) 110 MCG/ACT inhaler    Sig: Inhale 4 puffs twice a day with a spacer for the next 2 weeks or until cough and wheeze free    Dispense:  1 each    Refill:  0   EPINEPHrine 0.3 mg/0.3 mL IJ SOAJ injection    Sig: INJECT INTRAMUSCULARLY 1  PEN AS NEEDED FOR ALLERGIC   RESPONSE AS DIRECTED BY MD. Kirke Corin MEDICAL HELP AFTER  USE.    Dispense:  2 each    Refill:  0    This is a courtesy refill. Patient needs an OV for further refills.   Carbinoxamine Maleate 4 MG TABS    Sig: Take 1 tablet (4 mg total) by mouth 3 (three) times daily as needed.    Dispense:  180 tablet    Refill:  1    Medication List:  Current Outpatient Medications  Medication Sig Dispense Refill   ACCU-CHEK GUIDE test strip TEST BID PRN  1   albuterol (PROVENTIL) (2.5 MG/3ML) 0.083% nebulizer solution USE 1 VIAL IN NEBULIZER EVERY 4 HOURS - and as needed 75 mL 0   albuterol (VENTOLIN HFA) 108 (90 Base) MCG/ACT inhaler USE 2 INHALATIONS BY MOUTH EVERY 4 HOURS AS NEEDED FOR WHEEZING  OR SHORTNESS OF BREATH 26.8 g 4   apixaban (ELIQUIS) 5 MG TABS tablet Take 1 tablet (5 mg total) by mouth 2 (two) times daily. 180 tablet 1   ARIPiprazole (ABILIFY) 2 MG tablet Take 2 tablets (4 mg total) by mouth daily. 180 tablet 1   azelastine (ASTELIN) 0.1 % nasal spray Place 2 sprays into both  nostrils 2 (two) times daily. Use in each nostril as directed 30 mL 5   Blood Glucose Monitoring Suppl (ACCU-CHEK GUIDE) w/Device KIT See admin instructions.  1   budesonide-formoterol (SYMBICORT) 160-4.5 MCG/ACT inhaler INHALE 2 INHALATIONS BY MOUTH  INTO THE LUNGS IN THE MORNING  AND AT BEDTIME 10.2 g 11   CALCIUM-VITAMIN D PO Take 1 tablet by mouth daily.     clonazePAM (KLONOPIN) 0.5 MG tablet Take 1/2-1 tab po TID prn anxiety. (Do not combine with Ambien) 90 tablet 1   desonide (DESOWEN) 0.05 % ointment APPLY TO AFFECTED AREA(S)  TOPICALLY TWICE DAILY AS  NEEDED 180 g 1   diltiazem (CARDIZEM CD) 300 MG 24 hr capsule TAKE 1 CAPSULE BY MOUTH DAILY 90 capsule 3   diltiazem (CARDIZEM) 60 MG tablet TAKE DILITAZEM 60 MG BY MOUTH AS NEEDED FOR PALPITATIONS. YOU CAN TAKE EVERY 6 HOURS AS NEEDED UP TO TWICE DAILY WITH EACH EPISODE 60 tablet 7   famotidine (PEPCID) 20 MG tablet TAKE 1 TABLET BY MOUTH DAILY  AFTER SUPPER 100  tablet 2   fluticasone (FLONASE) 50 MCG/ACT nasal spray USE 2 SPRAYS IN BOTH NOSTRILS  DAILY 48 g 1   fluticasone (FLOVENT HFA) 110 MCG/ACT inhaler Inhale 4 puffs twice a day with a spacer for the next 2 weeks or until cough and wheeze free 1 each 0   hydrochlorothiazide (HYDRODIURIL) 25 MG tablet TAKE 1 TABLET BY MOUTH DAILY 90 tablet 0   meloxicam (MOBIC) 7.5 MG tablet Take 1 tablet (7.5 mg total) by mouth daily. 30 tablet 0   Mepolizumab (NUCALA) 100 MG/ML SOAJ Inject 1 mL (100 mg total) into the skin every 28 (twenty-eight) days. 1 mL 11   metFORMIN (GLUCOPHAGE-XR) 500 MG 24 hr tablet Take 500 mg by mouth in the morning and at bedtime.     mirtazapine (REMERON) 30 MG tablet TAKE 1 TABLET BY MOUTH AT  BEDTIME 90 tablet 3   montelukast (SINGULAIR) 10 MG tablet Take 1 tablet (10 mg total) by mouth at bedtime. 30 tablet 5   Oxcarbazepine (TRILEPTAL) 300 MG tablet Take 2 tablets (600 mg total) by mouth at bedtime. 180 tablet 1   oxybutynin (DITROPAN-XL) 10 MG 24 hr tablet Take 10 mg by mouth daily.     pantoprazole (PROTONIX) 40 MG tablet TAKE 1 TABLET BY MOUTH  DAILY. TAKE 30 TO 60  MINUTES BEFORE FIRST MEAL  OF THE DAY 90 tablet 3   potassium chloride SA (KLOR-CON M) 20 MEQ tablet TAKE 1 TABLET BY MOUTH DAILY 100 tablet 2   pravastatin (PRAVACHOL) 20 MG tablet Take 1 tablet (20 mg total) by mouth at bedtime. 90 tablet 3   pregabalin (LYRICA) 100 MG capsule Take 100 mg by mouth 3 (three) times daily.     Respiratory Therapy Supplies (NEBULIZER) DEVI Use as directed with nebulizer solution. 1 each 1   Respiratory Therapy Supplies (NEBULIZER/TUBING/MOUTHPIECE) KIT Use as directed with nebulizer machine 1 kit 12   Semaglutide, 1 MG/DOSE, (OZEMPIC, 1 MG/DOSE,) 2 MG/1.5ML SOPN Take 1 mg by mouth once a week.     zolpidem (AMBIEN) 10 MG tablet Take 0.5-1 tablets (5-10 mg total) by mouth at bedtime as needed. for sleep 90 tablet 1   busPIRone (BUSPAR) 30 MG tablet Take 1 tablet (30 mg total) by mouth 2  (two) times daily. 180 tablet 2   Carbinoxamine Maleate 4 MG TABS Take 1 tablet (4 mg total) by mouth 3 (three) times daily as needed. 180 tablet  1   EPINEPHrine 0.3 mg/0.3 mL IJ SOAJ injection INJECT INTRAMUSCULARLY 1  PEN AS NEEDED FOR ALLERGIC  RESPONSE AS DIRECTED BY MD. Kirke Corin MEDICAL HELP AFTER  USE. 2 each 0   No current facility-administered medications for this visit.   Allergies: Allergies  Allergen Reactions   Ramipril Other (See Comments)   Cymbalta [Duloxetine Hcl] Other (See Comments)    Caused hair loss   Methylprednisolone Acetate Hives and Rash    05/29/20 developed hives "all over my back" after MBB; resolved with Benadryl and "a few days."  Can tolerate Dexamethasone/Decadron. 05/29/20 developed hives "all over my back" after MBB; resolved with Benadryl and "a few days."  Can tolerate Dexamethasone/Decadron.   Rosuvastatin Calcium Other (See Comments) and Cough    Rapid heart rate   Almond Meal Itching and Swelling    Tongue swells and itches   Almond Oil Itching and Swelling    Itching and swelling of tongue    Carvedilol Cough   Fish Allergy Itching    Halibut fish   Losartan Potassium Cough   Morphine And Related Itching   I reviewed her past medical history, social history, family history, and environmental history and no significant changes have been reported from previous visit on 04/21/2022.   Objective: Physical Exam Not obtained as encounter was done via telephone.   Previous notes and tests were reviewed.  I discussed the assessment and treatment plan with the patient. The patient was provided an opportunity to ask questions and all were answered. The patient agreed with the plan and demonstrated an understanding of the instructions.   The patient was advised to call back or seek an in-person evaluation if the symptoms worsen or if the condition fails to improve as anticipated.  I provided 36 minutes of non-face-to-face time during this  encounter.  It was my pleasure to participate in [facility] care today. Please feel free to contact me with any questions or concerns.   Sincerely,  Gareth Morgan, FNP

## 2022-08-10 ENCOUNTER — Ambulatory Visit (INDEPENDENT_AMBULATORY_CARE_PROVIDER_SITE_OTHER): Payer: 59 | Admitting: Psychiatry

## 2022-08-10 DIAGNOSIS — F3181 Bipolar II disorder: Secondary | ICD-10-CM

## 2022-08-10 NOTE — Progress Notes (Signed)
[facility] Counselor/Therapist Progress Note  Patient ID: Chelsea Benson, MRN: [id number],    Date: 08/10/2022  Time Spent: 50 minutes  Virtual Visit via Telehealth Note ; MyChart Video Session  Connected with patient by a telemedicine/telehealth application, with their informed consent, and verified patient privacy and that I am speaking with the correct person using two identifiers. I discussed the limitations, risks, security and privacy concerns of performing psychotherapy and the availability of in person appointments. I also discussed with the patient that there may be a patient responsible charge related to this service. The patient expressed understanding and agreed to proceed. I discussed the treatment planning with the patient. The patient was provided an opportunity to ask questions and all were answered. The patient agreed with the plan and demonstrated an understanding of the instructions. The patient was advised to call  our office if  symptoms worsen or feel they are in a crisis state and need immediate contact.   Therapist Location: office Patient Location: home   Treatment Type: Individual Therapy  Reported Symptoms: anxiety,frustrations, anger, hurt  Mental Status Exam:  Appearance:   anxious   Behavior:  Appropriate  Motor:  Uses a cane or rollator in walking  Speech/Language:   Clear and Coherent  Affect:  anxious  Mood:  anxious, frustrated, some anger  Thought process:  goal directed  Thought content:    overthinking  Sensory/Perceptual disturbances:    WNL  Orientation:  oriented to person, place, time/date, situation, day of week, month of year, year, and stated date of Sept. 6, 2023  Attention:  Good  Concentration:  Fair  Memory:  WNL  Fund of knowledge:   Good  Insight:    Good and Fair  Judgment:   Good  Impulse Control:  Good   Risk Assessment: Danger to Self:  No Self-injurious Behavior: No Danger to Others: No Duty to Warn:no Physical  Aggression / Violence:No  Access to Firearms a concern: No  Gang Involvement:No   Subjective: Patient today reports anxiety as main symptom along with frustration, hurt, anger, all related to personal and family situations. Issues continue with her 50 yr old brother living with her and her adult grandson, which she processed more today as well as "things that I need to do to take care of myself". "Nobody ever asks how I'm doing." Feels she is trying to take care of herself better but it can be difficult due to some family "problems" and prior DSS situation.. States her earlier fear of "enlarged heart issue" was not correct, per her doctor. Trying not to worry as much, and trying to have more contact with positive people in her life.  Interventions: Cognitive Behavioral Therapy and Ego-Supportive  Treatment Goals:   Goals remain on tx plan as patient works on strategies to meet her goals.  Progress will be documented each visit in "Progress" section on Plan.   Long term goal: (measurable) Reduce overall level, frequency, and intensity of the anxiety so that daily functioning is not impaired.  Patient will eventually report a rating of "4" or less" on 1-10 anxiety scale for a 66-month time period where her daily functioning is not impaired.  Short term goal: Increase understanding of beliefs and messages that produce the worry and anxiety. Strategy: Patient will explore and work to stop cognitive messages that feed anxiety and work to replace them with more positive and empowering messages   Diagnosis:   ICD-10-CM   1. Bipolar II  disorder (HCC)  F31.81      Plan: Patient did well working in session today on her anxiety, frustration, hurt, and anger, which she reports are all related to family and personal situations.  Stressors continue with the 50 year old brother and adult grandson living with patient but at this point she is not wanting to make any changes on that.  Does state that she is  trying to focus more on the things "I need to do to take care of myself".  Is reportedly following through on recommendations by her medical providers including this therapist and working on her anxiety, excessive worrying, and setting healthier limits with others.  Is showing some very gradual progress in these areas but needs continued therapy to work further on having better boundaries and letting go of worrying over things out of her control. Encouraged patient in her practice of more positive and self affirming behaviors including: Remaining in contact with supportive people, staying on her prescribed medication, continue lessening her exposure to disturbing news on TV or online, trusting herself and recognizing her strengths versus limitations, reflect on the progress she has made, trust the support and care of family members that are healthy for her and support her, set limits with others who are unhealthy for her, get outside on her porch as much as possible in good weather as that is a place she really enjoys, healthy nutrition, staying in the present focusing on what she can change or control versus jumping ahead or going back to the past, positive self talk, and recognize the strength she shows working with goal directed behaviors to move in a direction that supports her improved emotional health and overall wellbeing.  Goal review and progress/challenges noted with patient.  Next appointment within 3 weeks.  This record has been created using [employer].  Chart creation errors have been sought, but may not always have been located and corrected.  Such creation errors do not reflect on the standard of medical care provided.   Mathis Fare, LCSW

## 2022-08-11 ENCOUNTER — Telehealth: Payer: 59 | Admitting: Family Medicine

## 2022-08-19 ENCOUNTER — Ambulatory Visit (HOSPITAL_COMMUNITY): Payer: 59 | Attending: Physician Assistant

## 2022-08-19 DIAGNOSIS — I517 Cardiomegaly: Secondary | ICD-10-CM | POA: Diagnosis present

## 2022-08-19 LAB — ECHOCARDIOGRAM COMPLETE
Area-P 1/2: 3.99 cm2
S' Lateral: 3.3 cm

## 2022-08-20 ENCOUNTER — Encounter: Payer: Self-pay | Admitting: Physician Assistant

## 2022-08-20 DIAGNOSIS — I77810 Thoracic aortic ectasia: Secondary | ICD-10-CM

## 2022-08-20 HISTORY — DX: Thoracic aortic ectasia: I77.810

## 2022-08-23 ENCOUNTER — Other Ambulatory Visit: Payer: Self-pay | Admitting: Cardiology

## 2022-08-23 DIAGNOSIS — I480 Paroxysmal atrial fibrillation: Secondary | ICD-10-CM

## 2022-08-23 NOTE — Telephone Encounter (Signed)
Prescription refill request for Eliquis received. Indication:Afib Last office visit:8/23 Scr:0.8 Age: 71 Weight:76.7 kg  Prescription refilled

## 2022-09-01 ENCOUNTER — Ambulatory Visit (INDEPENDENT_AMBULATORY_CARE_PROVIDER_SITE_OTHER): Payer: 59 | Admitting: Psychiatry

## 2022-09-01 DIAGNOSIS — F3181 Bipolar II disorder: Secondary | ICD-10-CM | POA: Diagnosis not present

## 2022-09-01 NOTE — Progress Notes (Signed)
[facility] Counselor/Therapist Progress Note  Patient ID: Chelsea Benson, MRN: [id number],    Date: 09/01/2022  Time Spent: 50 minutes   Virtual Visit via Telehealth Note: MyChart Video session Connected with patient by a telemedicine/telehealth application, with their informed consent, and verified patient privacy and that I am speaking with the correct person using two identifiers. I discussed the limitations, risks, security and privacy concerns of performing psychotherapy and the availability of in person appointments. I also discussed with the patient that there may be a patient responsible charge related to this service. The patient expressed understanding and agreed to proceed. I discussed the treatment planning with the patient. The patient was provided an opportunity to ask questions and all were answered. The patient agreed with the plan and demonstrated an understanding of the instructions. The patient was advised to call  our office if  symptoms worsen or feel they are in a crisis state and need immediate contact.   Therapist Location: office Patient Location: home  Treatment Type: Individual Therapy  Reported Symptoms: anxiety, frustration (especially with brother ad adult grandson)  Mental Status Exam:  Appearance:   Casual     Behavior:  Appropriate, Sharing, and Motivated  Motor:  Uses cane or rollator in walking  Speech/Language:   Clear and Coherent  Affect:  anxious  Mood:  anxious  Thought process:  goal directed  Thought content:    Some rumination  Sensory/Perceptual disturbances:    WNL  Orientation:  oriented to person, place, time/date, situation, day of week, month of year, year, and stated date of Sept. 28, 2023  Attention:  Fair  Concentration:  Good and Fair  Memory:  WNL  Fund of knowledge:   Good  Insight:    Good and Fair  Judgment:   Good  Impulse Control:  Good   Risk Assessment: Danger to Self:  No Self-injurious Behavior:  No Danger to Others: No Duty to Warn:no Physical Aggression / Violence:No  Access to Firearms a concern: No  Gang Involvement:No   Subjective:  Patient today reporting anxiety and frustration mostly related to family situations especially with her brother and adult grandson.  They both live with patient and she reports that they are getting worse in not picking up after themselves, not just in their bedrooms.Talked through her frustrations with them and how she plans to set limits with them if they continue to live with her. Knows that has not gone very well previously so plans to talk with her daughter who owns the home and set some boundaries for brother and grandson. Having some arthritic pain that worsens in damp weather and had meds to help with that. One daughter still not talking to patient after recent family concerns and DSS. Continues to try to decrease her worrying and look more for what may go right versus wrong. Does have some positive supportive people in her life.   Interventions: Cognitive Behavioral Therapy and Ego-Supportive   Treatment Goals:   Goals remain on tx plan as patient works on strategies to meet her goals.  Progress will be documented each visit in "Progress" section on Plan.   Long term goal: (measurable) Reduce overall level, frequency, and intensity of the anxiety so that daily functioning is not impaired.  Patient will eventually report a rating of "4" or less" on 1-10 anxiety scale for a 36-month time period where her daily functioning is not impaired.  Short term goal: Increase understanding of beliefs and messages  that produce the worry and anxiety. Strategy: Patient will explore and work to stop cognitive messages that feed anxiety and work to replace them with more positive and empowering messages  Diagnosis:   ICD-10-CM   1. Bipolar II disorder (HCC)  F31.81      Plan:  Patient showing good motivation and active participations in session today focusing  more on her anxiety, some anger and frustration and anger as noted above, in stressors involving her brother and her adult grandson. Shares that she does plan this week to get other family members involved to help "fix" the current problems she's having in her home as stated above, as she definitely feels the situation is worsening and is not healthy for herself to live in.  She is feeling confident that the adult daughter that owns the home can do something about the situation.  Forts that she is following through on directions from her different medical providers as far as her medications and also per our discussions in sessions to help her better manage anxiety, worry, and setting healthier limits for herself with others.  Does at times show some gradual progress, it is just difficult for her due to life situations that impact her.  Review of goal-directed behaviors again and patient is scheduled for next appointment within 4 weeks.  Knows that she can contact our office if needed before that time.  Encouraged patient and practicing more self affirming and positive behaviors including: Staying in contact with supportive people, remaining on her prescribed medication, continue lessening her exposure to disturbing news on TV or online, trusting herself and recognizing her strengths versus limitations, reflect on the progress she has already made, stressed the support and care of family members that are healthy for her and support her, set limits with others who are unhealthy for her, get outside on her porch as much as possible in good weather as that is a place she enjoys, healthy nutrition, staying in the present focusing on what she can change or control versus jumping ahead or going back to the past, encouraging self talk, and realize the strength she shows working with goal directed behaviors to move in a direction that supports her improved emotional health.  Goal review and progress/challenges noted with  patient.  Next appointment within 3 weeks.  This record has been created using [employer].  Chart creation errors have been sought, but may not always have been located and corrected.  Such creation errors do not reflect on the standard of medical care provided.   Mathis Fare, LCSW

## 2022-09-14 ENCOUNTER — Telehealth: Payer: Self-pay | Admitting: Family Medicine

## 2022-09-14 ENCOUNTER — Other Ambulatory Visit: Payer: Self-pay

## 2022-09-14 MED ORDER — TRIAMCINOLONE ACETONIDE 55 MCG/ACT NA AERO: 2.0000 | INHALATION_SPRAY | Freq: Every day | NASAL | 5 refills | Status: DC

## 2022-09-14 NOTE — Telephone Encounter (Signed)
Key for epipen is [id number]

## 2022-09-14 NOTE — Telephone Encounter (Signed)
Patient called and informed of the medication change and prescription sent into pharmacy.

## 2022-09-14 NOTE — Telephone Encounter (Signed)
Patient called in - DOB/Pharmacy verified - regarding the below notation.  Flonase PA...  KEY: [id number] PA Case ID: PA - [id number] created 09/14/22  Denied today Request Reference Number: [id number]. FLONASE ALGY SPR 50MCG is denied for not meeting the prior authorization requirement(s). Details of this decision are in the notice attached below or have been faxed to you.  Patient advised message will be forwarded to provider for alternative nasal spray prescription to be sent to [facility].

## 2022-09-14 NOTE — Telephone Encounter (Signed)
Patient received letter from [facility] saying her flonase and epi pen are no longer going to be covered. Patient is requesting a prior authorization to be completed for both medications.   Please advise

## 2022-09-14 NOTE — Telephone Encounter (Signed)
PA'S FOR BOTH HAVE been completed key for flonase [id number] for epipen

## 2022-09-14 NOTE — Progress Notes (Signed)
Per Webb Silversmith: Nasacort is being sent in to replace the Flonase that was denied via PA.

## 2022-09-14 NOTE — Telephone Encounter (Signed)
Nasacort 2 sprays in each nostril once a day as needed for nasal congestion

## 2022-09-15 ENCOUNTER — Other Ambulatory Visit: Payer: Self-pay | Admitting: Infectious Diseases

## 2022-09-15 ENCOUNTER — Ambulatory Visit: Payer: 59 | Admitting: Physical Therapy

## 2022-09-15 DIAGNOSIS — Z1231 Encounter for screening mammogram for malignant neoplasm of breast: Secondary | ICD-10-CM

## 2022-09-16 ENCOUNTER — Other Ambulatory Visit: Payer: Self-pay | Admitting: Cardiology

## 2022-09-17 ENCOUNTER — Ambulatory Visit
Admission: EM | Admit: 2022-09-17 | Discharge: 2022-09-17 | Disposition: A | Discharge status: Home / Self Care | Payer: 59 | Attending: Family Medicine | Admitting: Family Medicine

## 2022-09-17 ENCOUNTER — Encounter: Payer: Self-pay | Admitting: Emergency Medicine

## 2022-09-17 DIAGNOSIS — J069 Acute upper respiratory infection, unspecified: Secondary | ICD-10-CM

## 2022-09-17 DIAGNOSIS — E119 Type 2 diabetes mellitus without complications: Secondary | ICD-10-CM | POA: Diagnosis not present

## 2022-09-17 DIAGNOSIS — I10 Essential (primary) hypertension: Secondary | ICD-10-CM | POA: Insufficient documentation

## 2022-09-17 DIAGNOSIS — Z1152 Encounter for screening for COVID-19: Secondary | ICD-10-CM | POA: Insufficient documentation

## 2022-09-17 DIAGNOSIS — J4541 Moderate persistent asthma with (acute) exacerbation: Secondary | ICD-10-CM | POA: Insufficient documentation

## 2022-09-17 DIAGNOSIS — R059 Cough, unspecified: Secondary | ICD-10-CM | POA: Insufficient documentation

## 2022-09-17 MED ORDER — BENZONATATE 100 MG PO CAPS: 100.0000 mg | ORAL_CAPSULE | Freq: Three times a day (TID) | ORAL | 0 refills | Status: DC | PRN

## 2022-09-17 NOTE — ED Provider Notes (Signed)
[name]    CSN: [id number] Arrival date & time: 09/17/22  5701      History   Chief Complaint Chief Complaint  Patient presents with   Nasal Congestion   Cough    HPI Chelsea Benson is a 71 y.o. female.    Cough  Here for congestion and cough and headache that began yesterday.  She has not had any fever or chills.  No nausea, vomiting, or diarrhea.  No shortness of breath.  She has been around her brother who had had similar symptoms.  He had several negative COVID tests.  Past medical history includes history of asthma, diabetes, and hypertension.  She takes requests.  Past Medical History:  Diagnosis Date   Allergic rhinoconjunctivitis    Anginal pain (HCC)    Anxiety    Arthritis    Spondylolysis, knee and ankle arthritis pains.   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  He uses a walker   Depression    Diabetes mellitus without complication ([place])    dx in 2007   Diabetes mellitus, type II ([place])    Dysrhythmia    Eczema    Headache(784.0)    sinus related    History of kidney stones    History of nephrolithiasis    Hx of pulmonary embolus 2017   Hyperlipidemia    Hypertension    Insomnia    Memory change    OSA on CPAP    PSG 05/24/2016: AHI 17/hour 02 mean 76%.  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   Not well controlled severe persistent asthma 08/09/2022   HNP (herniated nucleus pulposus), lumbar 01/03/2022    Paroxysmal atrial fibrillation ([place]); CHA2DS2-VASc score 4 HTN, DM, female, age)-on Eliquis 02/24/2021   Leg cramps - Left Calf 12/31/2020   Pain of anterior chest wall with respiration 06/17/2020   Rapid palpitations 06/17/2020   Acute pharyngitis 01/13/2020   Cough variant asthma vs UACS 06/06/2019   Moderate persistent asthma with acute exacerbation 03/13/2019   Allergic rhinitis due to allergen 03/13/2019   Allergic conjunctivitis of both eyes 03/13/2019   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   Anxiety hyperventilation 10/03/2018   Bipolar II disorder ([place]) 09/18/2018   Cardiac risk counseling 01/07/2018   Influenza 10/19/2017   Cough 10/19/2017   Allergy to almonds 10/19/2017   Flexural atopic dermatitis 10/19/2017   Allergic rhinoconjunctivitis 10/19/2017   Moderate persistent asthma, uncomplicated 77/93/9030   S/P arthroscopy of right knee 06/04/2017   Acute blood loss as cause of postoperative anemia 06/04/2017   Mood disorder ([place])    Allergy history unknown    Anxiety 05/24/2017   Diabetes mellitus ([place]) 05/24/2017   Hyperlipidemia associated with type 2 diabetes mellitus ([place]) 05/24/2017   Insomnia 05/24/2017   Primary osteoarthritis of right knee 05/22/2017   Osteoarthritis of right knee 05/18/2017   Allergic  rhinitis due to pollen 10/06/2014   Arthritis 10/06/2014   Asthma 10/06/2014   Cubital tunnel syndrome, right 10/06/2014   Headache 10/06/2014   Essential hypertension 10/06/2014   Depression 08/07/2014   Postlaminectomy syndrome, lumbar region 09/06/2013   DDD (degenerative disc disease), lumbosacral 05/11/2013   Spinal stenosis of lumbar region 05/11/2013    Past Surgical History:  Procedure Laterality Date   ABDOMINAL HYSTERECTOMY     BACK SURGERY     2012- lumbar fusion   BREAST SURGERY     L cyst removed - 1972   BUNIONECTOMY Left    Great toe   calf -R- cyst removed     CARPAL TUNNEL RELEASE     both hands    CATARACT  EXTRACTION     CORONARY CT ANGIOGRAM  07/17/2019    Coronary calcium score 0.  Right dominant system.  Difficult to assess because of cardiac motion, but there is no obvious evidence of CAD.  Mild PA dilation.    HAMMER TOE SURGERY Left    L foot   NASAL SINUS SURGERY     NM MYOVIEW LTD  12/2008   Normal study.  Normal LV function.  EF 61%.  Apical thinning but no ischemia or infarction.   SHOULDER ARTHROSCOPY Left    R shoulder- RCR   TOE SURGERY     TOTAL KNEE ARTHROPLASTY Right 05/22/2017   Procedure: TOTAL KNEE ARTHROPLASTY;  Surgeon: Frederik Pear, MD;  Location: [facility];  Service: Orthopedics;  Laterality: Right;   TRANSTHORACIC ECHOCARDIOGRAM  06/2009    Technically difficult.  Moderate concentric LVH with normal LV function.  No regional wall motion normalities.  EF> 55%.  Normal right ventricle.  No valve lesions.  Normal filling pressures.   TRIGGER FINGER RELEASE     L thumb   TUBAL LIGATION      OB History   No obstetric history on file.      Home Medications    Prior to Admission medications   Medication Sig Start Date End Date Taking? Authorizing Provider  benzonatate (TESSALON) 100 MG capsule Take 1 capsule (100 mg total) by mouth 3 (three) times daily as needed for cough. 09/17/22  Yes Barrett Henle, MD  ACCU-CHEK GUIDE test strip TEST BID PRN 09/17/18   [provider]  albuterol (PROVENTIL) (2.5 MG/3ML) 0.083% nebulizer solution USE 1 VIAL IN NEBULIZER EVERY 4 HOURS - and as needed 07/06/21   Kennith Gain, MD  albuterol (VENTOLIN HFA) 108 (90 Base) MCG/ACT inhaler USE 2 INHALATIONS BY MOUTH EVERY 4 HOURS AS NEEDED FOR WHEEZING  OR SHORTNESS OF BREATH 05/30/22   Kennith Gain, MD  ARIPiprazole (ABILIFY) 2 MG tablet Take 2 tablets (4 mg total) by mouth daily. 07/22/22   Thayer Headings, PMHNP  azelastine (ASTELIN) 0.1 % nasal spray Place 2 sprays into both nostrils 2 (two) times daily. Use in each nostril as directed 04/21/22    Kennith Gain, MD  Blood Glucose Monitoring Suppl (ACCU-CHEK GUIDE) w/Device KIT See admin instructions. 09/18/18   [provider]  budesonide-formoterol (SYMBICORT) 160-4.5 MCG/ACT inhaler INHALE 2 INHALATIONS BY MOUTH  INTO THE LUNGS IN THE MORNING  AND AT BEDTIME 02/28/22   Kennith Gain, MD  busPIRone (BUSPAR) 30 MG tablet Take 1 tablet (30 mg total) by mouth 2 (two) times daily. 03/22/22 07/20/22  Thayer Headings, PMHNP  CALCIUM-VITAMIN D PO Take 1 tablet by mouth daily.    [provider]  Carbinoxamine Maleate 4  MG TABS Take 1 tablet (4 mg total) by mouth 3 (three) times daily as needed. 08/09/22   Dara Hoyer, FNP  clonazePAM (KLONOPIN) 0.5 MG tablet Take 1/2-1 tab po TID prn anxiety. (Do not combine with Ambien) 07/22/22   Thayer Headings, PMHNP  desonide (DESOWEN) 0.05 % ointment APPLY TO AFFECTED AREA(S)  TOPICALLY TWICE DAILY AS  NEEDED 02/03/21   Kennith Gain, MD  diltiazem (CARDIZEM CD) 300 MG 24 hr capsule TAKE 1 CAPSULE BY MOUTH DAILY 02/14/22   Leonie Man, MD  diltiazem (CARDIZEM) 60 MG tablet TAKE DILITAZEM 60 MG BY MOUTH AS NEEDED FOR PALPITATIONS. YOU CAN TAKE EVERY 6 HOURS AS NEEDED UP TO TWICE DAILY WITH EACH EPISODE 03/08/22   Leonie Man, MD  ELIQUIS 5 MG TABS tablet TAKE 1 TABLET BY MOUTH TWICE  DAILY 08/23/22   Leonie Man, MD  EPINEPHrine 0.3 mg/0.3 mL IJ SOAJ injection INJECT INTRAMUSCULARLY 1  PEN AS NEEDED FOR ALLERGIC  RESPONSE AS DIRECTED BY MD. Dalhart AFTER  USE. 08/09/22   Ambs, Kathrine Cords, FNP  famotidine (PEPCID) 20 MG tablet TAKE 1 TABLET BY MOUTH DAILY  AFTER SUPPER 06/23/22   Kennith Gain, MD  fluticasone [facility]) 50 MCG/ACT nasal spray USE 2 SPRAYS IN BOTH NOSTRILS  DAILY 07/08/22   Kennith Gain, MD  fluticasone (FLOVENT HFA) 110 MCG/ACT inhaler Inhale 4 puffs twice a day with a spacer for the next 2 weeks or until cough and wheeze free 08/09/22   Dara Hoyer, FNP   hydrochlorothiazide (HYDRODIURIL) 25 MG tablet TAKE 1 TABLET BY MOUTH DAILY 06/29/22   Leonie Man, MD  meloxicam (MOBIC) 7.5 MG tablet Take 1 tablet (7.5 mg total) by mouth daily. 12/10/21   Francene Finders, PA-C  Mepolizumab (NUCALA) 100 MG/ML SOAJ Inject 1 mL (100 mg total) into the skin every 28 (twenty-eight) days. 01/24/22   Kennith Gain, MD  metFORMIN (GLUCOPHAGE-XR) 500 MG 24 hr tablet Take 500 mg by mouth in the morning and at bedtime. 07/02/19   [provider]  mirtazapine (REMERON) 30 MG tablet TAKE 1 TABLET BY MOUTH AT  BEDTIME 03/22/22   Thayer Headings, PMHNP  montelukast (SINGULAIR) 10 MG tablet Take 1 tablet (10 mg total) by mouth at bedtime. 12/17/21   Kennith Gain, MD  Oxcarbazepine (TRILEPTAL) 300 MG tablet Take 2 tablets (600 mg total) by mouth at bedtime. 07/22/22   Thayer Headings, PMHNP  oxybutynin (DITROPAN-XL) 10 MG 24 hr tablet Take 10 mg by mouth daily. 12/17/21   [provider]  pantoprazole (PROTONIX) 40 MG tablet TAKE 1 TABLET BY MOUTH  DAILY. TAKE 30 TO 60  MINUTES BEFORE FIRST MEAL  OF THE DAY 10/25/19   Tanda Rockers, MD  potassium chloride SA (KLOR-CON M) 20 MEQ tablet TAKE 1 TABLET BY MOUTH DAILY 05/23/22   Leonie Man, MD  pravastatin (PRAVACHOL) 20 MG tablet Take 1 tablet (20 mg total) by mouth at bedtime. 09/29/21   Leonie Man, MD  pregabalin (LYRICA) 100 MG capsule Take 100 mg by mouth 3 (three) times daily.    [provider]  Respiratory Therapy Supplies (NEBULIZER) DEVI Use as directed with nebulizer solution. 06/01/18   Valentina Shaggy, MD  Respiratory Therapy Supplies (NEBULIZER/TUBING/MOUTHPIECE) KIT Use as directed with nebulizer machine 04/17/20   Padgett, Rae Halsted, MD  Semaglutide, 1 MG/DOSE, (OZEMPIC, 1 MG/DOSE,) 2 MG/1.5ML SOPN Take 1 mg by mouth once a week.  [provider]  triamcinolone (NASACORT) 55 MCG/ACT AERO nasal inhaler Place 2 sprays into the nose  daily. 09/14/22   Ambs, Kathrine Cords, FNP  zolpidem (AMBIEN) 10 MG tablet Take 0.5-1 tablets (5-10 mg total) by mouth at bedtime as needed. for sleep 07/26/22   Thayer Headings, PMHNP    Family History Family History  Problem Relation Age of Onset   Diabetes Sister    Diabetes Brother    Hypertension Brother    Cancer Maternal Grandmother    Heart failure Maternal Grandmother    Hypertension Paternal Grandmother    Asthma Daughter    Cancer Daughter    Depression Daughter    Hypertension Daughter    Heart failure Maternal Aunt    Pneumonia Mother    Diabetes Mother    Alcoholism Father    Alcohol abuse Father    Depression Daughter    Schizophrenia Grandchild    Allergies Neg Hx    Eczema Neg Hx    Immunodeficiency Neg Hx     Social History Social History   Tobacco Use   Smoking status: Never    Passive exposure: Yes   Smokeless tobacco: Never   Tobacco comments:    grandson smokes, but in his car  Vaping Use   Vaping Use: Never used  Substance Use Topics   Alcohol use: Yes    Comment: rare use   Drug use: No     Allergies   Ramipril, Cymbalta [duloxetine hcl], Methylprednisolone acetate, Rosuvastatin calcium, Almond meal, Almond oil, Carvedilol, Fish allergy, Losartan potassium, and Morphine and related   Review of Systems Review of Systems  Respiratory:  Positive for cough.      Physical Exam Triage Vital Signs ED Triage Vitals  Enc Vitals Group     BP 09/17/22 0932 (!) 167/89     Pulse Rate 09/17/22 0932 82     Resp 09/17/22 0932 18     Temp 09/17/22 0932 97.9 F (36.6 C)     Temp Source 09/17/22 0932 Oral     SpO2 09/17/22 0932 95 %     Weight --      Height --      Head Circumference --      Peak Flow --      Pain Score 09/17/22 0930 6     Pain Loc --      Pain Edu? --      Excl. in [place]? --    No data found.  Updated Vital Signs BP (!) 167/89 (BP Location: Left Arm)   Pulse 82   Temp 97.9 F (36.6 C) (Oral)   Resp 18   SpO2 95%    Visual Acuity Right Eye Distance:   Left Eye Distance:   Bilateral Distance:    Right Eye Near:   Left Eye Near:    Bilateral Near:     Physical Exam Vitals reviewed.  Constitutional:      General: She is not in acute distress.    Appearance: She is not toxic-appearing.  HENT:     Right Ear: Tympanic membrane and ear canal normal.     Left Ear: Tympanic membrane and ear canal normal.     Nose: Nose normal.     Mouth/Throat:     Mouth: Mucous membranes are moist.     Pharynx: No oropharyngeal exudate or posterior oropharyngeal erythema.  Eyes:     Extraocular Movements: Extraocular movements intact.     Conjunctiva/sclera: Conjunctivae normal.  Pupils: Pupils are equal, round, and reactive to light.  Cardiovascular:     Rate and Rhythm: Normal rate and regular rhythm.     Heart sounds: No murmur heard. Pulmonary:     Effort: Pulmonary effort is normal. No respiratory distress.     Breath sounds: No stridor. No wheezing, rhonchi or rales.  Musculoskeletal:     Cervical back: Neck supple.  Lymphadenopathy:     Cervical: No cervical adenopathy.  Skin:    Capillary Refill: Capillary refill takes less than 2 seconds.     Coloration: Skin is not jaundiced or pale.  Neurological:     General: No focal deficit present.     Mental Status: She is alert and oriented to person, place, and time.  Psychiatric:        Behavior: Behavior normal.      UC Treatments / Results  Labs (all labs ordered are listed, but only abnormal results are displayed) Labs Reviewed  SARS CORONAVIRUS 2 (TAT 6-24 HRS)    EKG   Radiology No results found.  Procedures Procedures (including critical care time)  Medications Ordered in UC Medications - No data to display  Initial Impression / Assessment and Plan / UC Course  I have reviewed the triage vital signs and the nursing notes.  Pertinent labs & imaging results that were available during my care of the patient were reviewed  by me and considered in my medical decision making (see chart for details).        She is swabbed for COVID, and we will notify her if positive.  If positive she should have a prescription for molnupiravir.  She cannot take Paxlovid as it interacts with her Eliquis. Tessalon perles for the cough. She is already taking coricidin HBP Final Clinical Impressions(s) / UC Diagnoses   Final diagnoses:  Viral URI with cough     Discharge Instructions       You have been swabbed for COVID, and the test will result in the next 24 hours. Our staff will call you if positive. If the test is positive, you should quarantine for 5 days from the start of your symptoms  Take benzonatate 100 mg, 1 tab every 8 hours as needed for cough.       ED Prescriptions     Medication Sig Dispense Auth. Provider   benzonatate (TESSALON) 100 MG capsule Take 1 capsule (100 mg total) by mouth 3 (three) times daily as needed for cough. 21 capsule Barrett Henle, MD      I have reviewed the PDMP during this encounter.   Barrett Henle, MD 09/17/22 1002

## 2022-09-17 NOTE — ED Triage Notes (Signed)
Pt said yesterday started with cough, congestion, headache. Pt has mucus in her chest and coughing it up. No fevers, no chills

## 2022-09-17 NOTE — Discharge Instructions (Addendum)
  You have been swabbed for COVID, and the test will result in the next 24 hours. Our staff will call you if positive. If the test is positive, you should quarantine for 5 days from the start of your symptoms  Take benzonatate 100 mg, 1 tab every 8 hours as needed for cough.   

## 2022-09-18 LAB — SARS CORONAVIRUS 2 (TAT 6-24 HRS): SARS Coronavirus 2: NEGATIVE

## 2022-09-20 ENCOUNTER — Ambulatory Visit: Payer: 59

## 2022-09-21 ENCOUNTER — Ambulatory Visit (INDEPENDENT_AMBULATORY_CARE_PROVIDER_SITE_OTHER): Payer: 59 | Admitting: Psychiatry

## 2022-09-21 DIAGNOSIS — F3181 Bipolar II disorder: Secondary | ICD-10-CM | POA: Diagnosis not present

## 2022-09-21 NOTE — Progress Notes (Addendum)
[facility] Counselor/Therapist Progress Note  Patient ID: Chelsea Benson, MRN: [id number],    Date: 09/21/2022  Time Spent: 50 minutes   Virtual Visit via Telehealth Note: MyChart Video session Connected with patient by a telemedicine/telehealth application, with their informed consent, and verified patient privacy and that I am speaking with the correct person using two identifiers. I discussed the limitations, risks, security and privacy concerns of performing psychotherapy and the availability of in person appointments. I also discussed with the patient that there may be a patient responsible charge related to this service. The patient expressed understanding and agreed to proceed. I discussed the treatment planning with the patient. The patient was provided an opportunity to ask questions and all were answered. The patient agreed with the plan and demonstrated an understanding of the instructions. The patient was advised to call  our office if  symptoms worsen or feel they are in a crisis state and need immediate contact.   Therapist Location: office Patient Location: home   Treatment Type: Individual Therapy  Reported Symptoms: anxiety  Mental Status Exam:  Appearance:   Casual     Behavior:  Appropriate, Sharing, and Motivated  Motor:  Uses cane when walking  Speech/Language:   Clear and Coherent  Affect:  anxious  Mood:  anxious  Thought process:  goal directed  Thought content:    Some overthinking  Sensory/Perceptual disturbances:    WNL  Orientation:  oriented to person, place, time/date, situation, day of week, month of year, year, and stated date of Oct. 18, 2023  Attention:  Good  Concentration:  Good  Memory:  WNL  Fund of knowledge:   Good  Insight:    Good and Fair  Judgment:   Good  Impulse Control:  Good   Risk Assessment: Danger to Self:  No Self-injurious Behavior: No Danger to Others: No Duty to Warn:no Physical Aggression / Violence:No   Access to Firearms a concern: No  Gang Involvement:No   Subjective:   Patient reporting in session today symptoms of anxiety related to personal, family, and health. "I was a mess a week ago because one of my daughters put a message on facebook about how "I was dead to her".  Processes this more today although more calm than she states she was earlier in the week. Explored with patient what she can control and what she cannot control, and how to take care of her own needs and being able to heal from this and other previous painful encounters with daughter. Shares her grief that the relationship with this daughter has changed so much and feeling she can't do anything to control or change it. Is planning to visit a friend in [place] for a month in November which she feels "will be a good thing for me." Ongoing issues with brother and adult grandson who live with her. Had some difficulty sleeping but she called her med provider and med adjusted and sleeping better now. Making some progress in her health concerns, back is better post-surgery but painful in rainy weather but "Tylenol for arthritis helps a lot". Needed session to share and process a lot of family relationship stressors that seems to be her biggest struggle right now. Trying to work with healthier boundaries and taking "breaks" from dwelling on the issues with her younger daughter as patient is realizing she has no control over daughter and can't make daughter communicate with her to try and resolve their current issues impacting their relationship.Worked  with patient some additional self-care and positive self-talk which she feels can be helpful. Able to name her positive supports available to her including a sister, a friend in [place], and girlfriend across the street.   Interventions: Cognitive Behavioral Therapy and Ego-Supportive   Treatment Goals:   Goals remain on tx plan as patient works on strategies to meet her goals.  Progress will  be documented each visit in "Progress" section on Plan.   Long term goal: (measurable) Reduce overall level, frequency, and intensity of the anxiety so that daily functioning is not impaired.  Patient will eventually report a rating of "4" or less" on 1-10 anxiety scale for a 27-month time period where her daily functioning is not impaired.  Short term goal: Increase understanding of beliefs and messages that produce the worry and anxiety. Strategy: Patient will explore and work to stop cognitive messages that feed anxiety and work to replace them with more positive and empowering messages  Diagnosis:   ICD-10-CM   1. Bipolar II disorder ([place])  F31.81      Plan:  Patient today showing active participation and good motivation in session focusing on some very difficult family behaviors and communication that have been hurtful to patient, and as she describes, she does not understand what would have provoked this except the fact she has had to set some boundaries with one of her daughters due to behavioral issues.  Patient states it helped so much today just to be able to vent her concerns and get some support for herself.  Is following through on some self caring behaviors, and planning a trip to visit a friend in [place] for approximately 1 month.  She is scheduled to leave November 5th and is really looking forward to that trip.  Still trying to have boundaries for her adult grandson and a brother that live with her and not following "house rules".  Patient feels a little powered to be able to do much about it as her daughter owns the home and she is actually looking for a different place to live, perhaps an apartment in a different area of town.  Plans to follow-up on this after she gets back from [place].  Denies any SI.  The [place] trip she expects to be overly positive thing for her as that is a longtime friend who she sees approximately 3 times a year but does stay in phone contact with them.   Review of goal-directed behaviors, good self-care, and healthy boundaries as we ended session. Encouraged patient in her practice of more self affirming and positive behaviors including: Remaining in contact with supportive people, remaining on her prescribed medication, continue to lessen her exposure to disturbing news on TV or on line as this easily feels her anxiety, trusting herself and recognizing her strengths versus limitations, reflect on the progress she has already made, no that she has a support and care of family members that are healthy for her and help support her, set limits with others who are unhealthy for her, get outside on her porch when possible and good weather as that is a place she really enjoys, healthy nutrition, staying in the present focusing on what she can change versus going back to the past or jumping to forehead, encouraging self talk, and recognize the strength she shows working with goal-directed behaviors to move in a direction that supports her improved emotional health.  Goal reviewing progress/challenges noted with patient.  Next appointment within 3 weeks.  This record  has been created using [employer].  Chart creation errors have been sought, but may not always have been located and corrected.  Such creation errors do not reflect on the standard of medical care provided.   Mathis Fare, LCSW

## 2022-09-22 ENCOUNTER — Ambulatory Visit: Payer: 59 | Admitting: Physical Therapy

## 2022-09-22 ENCOUNTER — Ambulatory Visit
Admission: RE | Admit: 2022-09-22 | Discharge: 2022-09-22 | Disposition: A | Discharge status: Home / Self Care | Payer: 59 | Source: Ambulatory Visit | Attending: Physician Assistant | Admitting: Physician Assistant

## 2022-09-22 ENCOUNTER — Other Ambulatory Visit: Payer: Self-pay

## 2022-09-22 VITALS — BP 156/93 | HR 90 | Temp 98.3°F | Resp 18

## 2022-09-22 DIAGNOSIS — E1169 Type 2 diabetes mellitus with other specified complication: Secondary | ICD-10-CM | POA: Diagnosis not present

## 2022-09-22 DIAGNOSIS — J45901 Unspecified asthma with (acute) exacerbation: Secondary | ICD-10-CM

## 2022-09-22 DIAGNOSIS — H1033 Unspecified acute conjunctivitis, bilateral: Secondary | ICD-10-CM

## 2022-09-22 DIAGNOSIS — J019 Acute sinusitis, unspecified: Secondary | ICD-10-CM | POA: Diagnosis not present

## 2022-09-22 MED ORDER — PREDNISONE 20 MG PO TABS: 40.0000 mg | ORAL_TABLET | Freq: Every day | ORAL | 0 refills | Status: AC

## 2022-09-22 MED ORDER — POLYMYXIN B-TRIMETHOPRIM 10000-0.1 UNIT/ML-% OP SOLN: 1.0000 [drp] | OPHTHALMIC | 0 refills | Status: AC

## 2022-09-22 MED ORDER — AMOXICILLIN-POT CLAVULANATE 875-125 MG PO TABS: 1.0000 | ORAL_TABLET | Freq: Two times a day (BID) | ORAL | 0 refills | Status: DC

## 2022-09-22 NOTE — ED Triage Notes (Signed)
Pt here for cough and congestion; pt sts seen on Saturday for same and no improvement

## 2022-09-22 NOTE — ED Provider Notes (Addendum)
[facility]    CSN: [id number] Arrival date & time: 09/22/22  3710      History   Chief Complaint Chief Complaint  Patient presents with   Appointment   Cough    HPI Chelsea Benson is a 71 y.o. female.   Patient here today for evaluation of cough and congestion.  She reports that she was seen about 4 days ago for similar symptoms without significant improvement since that time.  COVID test was negative.  She reports that she has had more shortness of breath and wheezing.  She complains of sinus drainage that is thick and has a bad taste.  She has not had fever.  She denies any diarrhea but has had some vomiting.  The history is provided by the patient.  Cough Associated symptoms: eye discharge, shortness of breath and wheezing   Associated symptoms: no chills, no ear pain, no fever and no sore throat     Past Medical History:  Diagnosis Date   Allergic rhinoconjunctivitis    Anginal pain (HCC)    Anxiety    Arthritis    Spondylolysis, knee and ankle arthritis pains.   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  He uses a walker   Depression    Diabetes mellitus without complication ([place])    dx in 2007   Diabetes mellitus, type II ([place])    Dysrhythmia    Eczema    Headache(784.0)    sinus related    History of kidney stones    History of nephrolithiasis    Hx of pulmonary embolus 2017   Hyperlipidemia    Hypertension    Insomnia    Memory change    OSA on CPAP    PSG 05/24/2016: AHI 17/hour 02 mean 76%.  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   Not well controlled severe persistent asthma 08/09/2022   HNP (herniated nucleus pulposus), lumbar 01/03/2022   Paroxysmal atrial fibrillation ([place]); CHA2DS2-VASc score 4 HTN, DM, female, age)-on Eliquis 02/24/2021   Leg cramps - Left Calf 12/31/2020   Pain of anterior chest wall with respiration 06/17/2020   Rapid palpitations 06/17/2020   Acute pharyngitis 01/13/2020   Cough variant asthma vs UACS 06/06/2019   Moderate persistent asthma with acute exacerbation 03/13/2019   Allergic rhinitis due to allergen 03/13/2019   Allergic conjunctivitis of both eyes 03/13/2019   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   Anxiety hyperventilation 10/03/2018   Bipolar II disorder ([place]) 09/18/2018   Cardiac risk counseling 01/07/2018   Influenza 10/19/2017   Cough 10/19/2017   Allergy to almonds 10/19/2017   Flexural atopic dermatitis 10/19/2017   Allergic rhinoconjunctivitis 10/19/2017   Moderate persistent asthma, uncomplicated 62/69/4854   S/P arthroscopy of right knee 06/04/2017   Acute blood loss as cause of postoperative anemia 06/04/2017   Mood disorder ([place])    Allergy  history unknown    Anxiety 05/24/2017   Diabetes mellitus ([place]) 05/24/2017   Hyperlipidemia associated with type 2 diabetes mellitus ([place]) 05/24/2017   Insomnia 05/24/2017   Primary osteoarthritis of right knee 05/22/2017   Osteoarthritis of right knee 05/18/2017   Allergic rhinitis due to pollen 10/06/2014   Arthritis 10/06/2014   Asthma 10/06/2014   Cubital tunnel syndrome, right 10/06/2014   Headache 10/06/2014   Essential hypertension 10/06/2014   Depression 08/07/2014   Postlaminectomy syndrome, lumbar region 09/06/2013   DDD (degenerative disc disease), lumbosacral 05/11/2013   Spinal stenosis of lumbar region 05/11/2013    Past Surgical History:  Procedure Laterality Date    ABDOMINAL HYSTERECTOMY     BACK SURGERY     2012- lumbar fusion   BREAST SURGERY     L cyst removed - 1972   BUNIONECTOMY Left    Great toe   calf -R- cyst removed     CARPAL TUNNEL RELEASE     both hands    CATARACT EXTRACTION     CORONARY CT ANGIOGRAM  07/17/2019    Coronary calcium score 0.  Right dominant system.  Difficult to assess because of cardiac motion, but there is no obvious evidence of CAD.  Mild PA dilation.    HAMMER TOE SURGERY Left    L foot   NASAL SINUS SURGERY     NM MYOVIEW LTD  12/2008   Normal study.  Normal LV function.  EF 61%.  Apical thinning but no ischemia or infarction.   SHOULDER ARTHROSCOPY Left    R shoulder- RCR   TOE SURGERY     TOTAL KNEE ARTHROPLASTY Right 05/22/2017   Procedure: TOTAL KNEE ARTHROPLASTY;  Surgeon: Frederik Pear, MD;  Location: [facility];  Service: Orthopedics;  Laterality: Right;   TRANSTHORACIC ECHOCARDIOGRAM  06/2009    Technically difficult.  Moderate concentric LVH with normal LV function.  No regional wall motion normalities.  EF> 55%.  Normal right ventricle.  No valve lesions.  Normal filling pressures.   TRIGGER FINGER RELEASE     L thumb   TUBAL LIGATION      OB History   No obstetric history on file.      Home Medications    Prior to Admission medications   Medication Sig Start Date End Date Taking? Authorizing Provider  amoxicillin-clavulanate (AUGMENTIN) 875-125 MG tablet Take 1 tablet by mouth every 12 (twelve) hours. 09/22/22  Yes Francene Finders, PA-C  predniSONE (DELTASONE) 20 MG tablet Take 2 tablets (40 mg total) by mouth daily with breakfast for 5 days. 09/22/22 09/27/22 Yes Francene Finders, PA-C  trimethoprim-polymyxin b (POLYTRIM) ophthalmic solution Place 1 drop into both eyes every 4 (four) hours for 7 days. 09/22/22 09/29/22 Yes Francene Finders, PA-C  ACCU-CHEK GUIDE test strip TEST BID PRN 09/17/18   [provider]  albuterol (PROVENTIL) (2.5 MG/3ML) 0.083% nebulizer solution USE 1 VIAL  IN NEBULIZER EVERY 4 HOURS - and as needed 07/06/21   Kennith Gain, MD  albuterol (VENTOLIN HFA) 108 (90 Base) MCG/ACT inhaler USE 2 INHALATIONS BY MOUTH EVERY 4 HOURS AS NEEDED FOR WHEEZING  OR SHORTNESS OF BREATH 05/30/22   Kennith Gain, MD  ARIPiprazole (ABILIFY) 2 MG tablet Take 2 tablets (4 mg total) by mouth daily. 07/22/22   Thayer Headings, PMHNP  azelastine (ASTELIN) 0.1 % nasal spray Place 2 sprays into both nostrils 2 (two) times daily. Use in each nostril as directed 04/21/22   Kennith Gain,  MD  benzonatate (TESSALON) 100 MG capsule Take 1 capsule (100 mg total) by mouth 3 (three) times daily as needed for cough. 09/17/22   Barrett Henle, MD  Blood Glucose Monitoring Suppl (ACCU-CHEK GUIDE) w/Device KIT See admin instructions. 09/18/18   [provider]  budesonide-formoterol (SYMBICORT) 160-4.5 MCG/ACT inhaler INHALE 2 INHALATIONS BY MOUTH  INTO THE LUNGS IN THE MORNING  AND AT BEDTIME 02/28/22   Kennith Gain, MD  busPIRone (BUSPAR) 30 MG tablet Take 1 tablet (30 mg total) by mouth 2 (two) times daily. 03/22/22 07/20/22  Thayer Headings, PMHNP  CALCIUM-VITAMIN D PO Take 1 tablet by mouth daily.    [provider]  Carbinoxamine Maleate 4 MG TABS Take 1 tablet (4 mg total) by mouth 3 (three) times daily as needed. 08/09/22   Dara Hoyer, FNP  clonazePAM (KLONOPIN) 0.5 MG tablet Take 1/2-1 tab po TID prn anxiety. (Do not combine with Ambien) 07/22/22   Thayer Headings, PMHNP  desonide (DESOWEN) 0.05 % ointment APPLY TO AFFECTED AREA(S)  TOPICALLY TWICE DAILY AS  NEEDED 02/03/21   Kennith Gain, MD  diltiazem (CARDIZEM CD) 300 MG 24 hr capsule TAKE 1 CAPSULE BY MOUTH DAILY 02/14/22   Leonie Man, MD  diltiazem (CARDIZEM) 60 MG tablet TAKE DILITAZEM 60 MG BY MOUTH AS NEEDED FOR PALPITATIONS. YOU CAN TAKE EVERY 6 HOURS AS NEEDED UP TO TWICE DAILY WITH EACH EPISODE 03/08/22   Leonie Man, MD  ELIQUIS 5 MG TABS  tablet TAKE 1 TABLET BY MOUTH TWICE  DAILY 08/23/22   Leonie Man, MD  EPINEPHrine 0.3 mg/0.3 mL IJ SOAJ injection INJECT INTRAMUSCULARLY 1  PEN AS NEEDED FOR ALLERGIC  RESPONSE AS DIRECTED BY MD. [place] AFTER  USE. 08/09/22   Ambs, Kathrine Cords, FNP  famotidine (PEPCID) 20 MG tablet TAKE 1 TABLET BY MOUTH DAILY  AFTER SUPPER 06/23/22   Kennith Gain, MD  fluticasone [facility]) 50 MCG/ACT nasal spray USE 2 SPRAYS IN BOTH NOSTRILS  DAILY 07/08/22   Kennith Gain, MD  fluticasone (FLOVENT HFA) 110 MCG/ACT inhaler Inhale 4 puffs twice a day with a spacer for the next 2 weeks or until cough and wheeze free 08/09/22   Dara Hoyer, FNP  hydrochlorothiazide (HYDRODIURIL) 25 MG tablet TAKE 1 TABLET BY MOUTH DAILY 09/19/22   Leonie Man, MD  meloxicam (MOBIC) 7.5 MG tablet Take 1 tablet (7.5 mg total) by mouth daily. 12/10/21   Francene Finders, PA-C  Mepolizumab (NUCALA) 100 MG/ML SOAJ Inject 1 mL (100 mg total) into the skin every 28 (twenty-eight) days. 01/24/22   Kennith Gain, MD  metFORMIN (GLUCOPHAGE-XR) 500 MG 24 hr tablet Take 500 mg by mouth in the morning and at bedtime. 07/02/19   [provider]  mirtazapine (REMERON) 30 MG tablet TAKE 1 TABLET BY MOUTH AT  BEDTIME 03/22/22   Thayer Headings, PMHNP  montelukast (SINGULAIR) 10 MG tablet Take 1 tablet (10 mg total) by mouth at bedtime. 12/17/21   Kennith Gain, MD  Oxcarbazepine (TRILEPTAL) 300 MG tablet Take 2 tablets (600 mg total) by mouth at bedtime. 07/22/22   Thayer Headings, PMHNP  oxybutynin (DITROPAN-XL) 10 MG 24 hr tablet Take 10 mg by mouth daily. 12/17/21   [provider]  pantoprazole (PROTONIX) 40 MG tablet TAKE 1 TABLET BY MOUTH  DAILY. TAKE 30 TO 60  MINUTES BEFORE FIRST MEAL  OF THE DAY 10/25/19   Tanda Rockers, MD  potassium  chloride SA (KLOR-CON M) 20 MEQ tablet TAKE 1 TABLET BY MOUTH DAILY 05/23/22   Leonie Man, MD  pravastatin (PRAVACHOL) 20 MG tablet Take  1 tablet (20 mg total) by mouth at bedtime. 09/29/21   Leonie Man, MD  pregabalin (LYRICA) 100 MG capsule Take 100 mg by mouth 3 (three) times daily.    [provider]  Respiratory Therapy Supplies (NEBULIZER) DEVI Use as directed with nebulizer solution. 06/01/18   Valentina Shaggy, MD  Respiratory Therapy Supplies (NEBULIZER/TUBING/MOUTHPIECE) KIT Use as directed with nebulizer machine 04/17/20   Padgett, Rae Halsted, MD  Semaglutide, 1 MG/DOSE, (OZEMPIC, 1 MG/DOSE,) 2 MG/1.5ML SOPN Take 1 mg by mouth once a week.    [provider]  triamcinolone (NASACORT) 55 MCG/ACT AERO nasal inhaler Place 2 sprays into the nose daily. 09/14/22   Ambs, Kathrine Cords, FNP  zolpidem (AMBIEN) 10 MG tablet Take 0.5-1 tablets (5-10 mg total) by mouth at bedtime as needed. for sleep 07/26/22   Thayer Headings, PMHNP    Family History Family History  Problem Relation Age of Onset   Diabetes Sister    Diabetes Brother    Hypertension Brother    Cancer Maternal Grandmother    Heart failure Maternal Grandmother    Hypertension Paternal Grandmother    Asthma Daughter    Cancer Daughter    Depression Daughter    Hypertension Daughter    Heart failure Maternal Aunt    Pneumonia Mother    Diabetes Mother    Alcoholism Father    Alcohol abuse Father    Depression Daughter    Schizophrenia Grandchild    Allergies Neg Hx    Eczema Neg Hx    Immunodeficiency Neg Hx     Social History Social History   Tobacco Use   Smoking status: Never    Passive exposure: Yes   Smokeless tobacco: Never   Tobacco comments:    grandson smokes, but in his car  Vaping Use   Vaping Use: Never used  Substance Use Topics   Alcohol use: Yes    Comment: rare use   Drug use: No     Allergies   Ramipril, Cymbalta [duloxetine hcl], Methylprednisolone acetate, Rosuvastatin calcium, Almond meal, Almond oil, Carvedilol, Fish allergy, Losartan potassium, and Morphine and related   Review of  Systems Review of Systems  Constitutional:  Negative for chills and fever.  HENT:  Positive for congestion and sinus pressure. Negative for ear pain and sore throat.   Eyes:  Positive for discharge and redness.  Respiratory:  Positive for cough, shortness of breath and wheezing.   Gastrointestinal:  Positive for vomiting. Negative for abdominal pain and diarrhea.     Physical Exam Triage Vital Signs ED Triage Vitals [09/22/22 0852]  Enc Vitals Group     BP (!) 156/93     Pulse Rate 90     Resp 18     Temp 98.3 F (36.8 C)     Temp Source Oral     SpO2 95 %     Weight      Height      Head Circumference      Peak Flow      Pain Score 4     Pain Loc      Pain Edu?      Excl. in [place]?    No data found.  Updated Vital Signs BP (!) 156/93 (BP Location: Left Arm)   Pulse 90   Temp  98.3 F (36.8 C) (Oral)   Resp 18   SpO2 95%      Physical Exam Vitals and nursing note reviewed.  Constitutional:      General: She is not in acute distress.    Appearance: Normal appearance. She is not ill-appearing.  HENT:     Head: Normocephalic and atraumatic.     Nose: Congestion present.     Mouth/Throat:     Mouth: Mucous membranes are moist.     Pharynx: No oropharyngeal exudate or posterior oropharyngeal erythema.  Eyes:     Comments: Bilateral conjunctiva injected- right worse than left  Cardiovascular:     Rate and Rhythm: Normal rate and regular rhythm.     Heart sounds: Normal heart sounds. No murmur heard. Pulmonary:     Effort: Pulmonary effort is normal. No respiratory distress.     Breath sounds: Normal breath sounds. No wheezing, rhonchi or rales.  Skin:    General: Skin is warm and dry.  Neurological:     Mental Status: She is alert.  Psychiatric:        Mood and Affect: Mood normal.        Thought Content: Thought content normal.      UC Treatments / Results  Labs (all labs ordered are listed, but only abnormal results are displayed) Labs Reviewed - No  data to display  EKG   Radiology No results found.  Procedures Procedures (including critical care time)  Medications Ordered in UC Medications - No data to display  Initial Impression / Assessment and Plan / UC Course  I have reviewed the triage vital signs and the nursing notes.  Pertinent labs & imaging results that were available during my care of the patient were reviewed by me and considered in my medical decision making (see chart for details).    Augmentin prescribed to cover sinusitis.  Steroid burst prescribed for asthma exacerbation, discussed this may increase her glucose levels and recommended she monitor same. Polytrim drops prescribed to cover conjunctivitis. Encouraged follow up with any further concerns or lack of improvement with treatment. Patient expressed understanding.   Final Clinical Impressions(s) / UC Diagnoses   Final diagnoses:  Acute sinusitis, recurrence not specified, unspecified location  Asthma with acute exacerbation, unspecified asthma severity, unspecified whether persistent  Type 2 diabetes mellitus with other specified complication, without long-term current use of insulin (HCC)  Acute conjunctivitis of both eyes, unspecified acute conjunctivitis type   Discharge Instructions   None    ED Prescriptions     Medication Sig Dispense Auth. Provider   predniSONE (DELTASONE) 20 MG tablet Take 2 tablets (40 mg total) by mouth daily with breakfast for 5 days. 10 tablet Francene Finders, PA-C   amoxicillin-clavulanate (AUGMENTIN) 875-125 MG tablet Take 1 tablet by mouth every 12 (twelve) hours. 14 tablet Ewell Poe F, PA-C   trimethoprim-polymyxin b (POLYTRIM) ophthalmic solution Place 1 drop into both eyes every 4 (four) hours for 7 days. 10 mL Francene Finders, PA-C      PDMP not reviewed this encounter.   Francene Finders, PA-C 09/22/22 0936    Francene Finders, PA-C 09/22/22 [phone]

## 2022-09-23 ENCOUNTER — Telehealth: Payer: Self-pay | Admitting: Psychiatry

## 2022-09-23 ENCOUNTER — Ambulatory Visit: Payer: 59

## 2022-09-23 NOTE — Telephone Encounter (Signed)
LVM to rtc with pharmacy,[facility] is a coupon

## 2022-09-23 NOTE — Telephone Encounter (Signed)
Patient lvm requesting a refill on the Klonopin. Patient stated to send to [facility]. A follow up appointment is scheduled for 12/19  Contact information [phone]

## 2022-09-26 ENCOUNTER — Other Ambulatory Visit: Payer: Self-pay | Admitting: Allergy

## 2022-09-26 ENCOUNTER — Other Ambulatory Visit: Payer: Self-pay

## 2022-09-26 DIAGNOSIS — F419 Anxiety disorder, unspecified: Secondary | ICD-10-CM

## 2022-09-26 DIAGNOSIS — F410 Panic disorder [episodic paroxysmal anxiety] without agoraphobia: Secondary | ICD-10-CM

## 2022-09-26 MED ORDER — BUSPIRONE HCL 30 MG PO TABS: 30.0000 mg | ORAL_TABLET | Freq: Two times a day (BID) | ORAL | 0 refills | Status: DC

## 2022-09-26 MED ORDER — CLONAZEPAM 0.5 MG PO TABS: ORAL_TABLET | ORAL | 0 refills | Status: DC

## 2022-09-26 NOTE — Telephone Encounter (Signed)
Pt called back at 9:22a.  She said she meant to say [facility].  She also said she will need her Buspar before she Janett Billow again.  Pls send it in also.  Next appt 12/19.

## 2022-09-26 NOTE — Telephone Encounter (Signed)
Pended.

## 2022-10-03 ENCOUNTER — Ambulatory Visit: Payer: 59 | Admitting: Cardiology

## 2022-10-06 ENCOUNTER — Other Ambulatory Visit: Payer: Self-pay | Admitting: Allergy

## 2022-10-06 ENCOUNTER — Telehealth: Payer: Self-pay | Admitting: Allergy

## 2022-10-06 MED ORDER — FLUTICASONE PROPIONATE 50 MCG/ACT NA SUSP: NASAL | 0 refills | Status: DC

## 2022-10-06 MED ORDER — AZELASTINE HCL 0.1 % NA SOLN: 2.0000 | Freq: Two times a day (BID) | NASAL | 0 refills | Status: DC

## 2022-10-06 NOTE — Telephone Encounter (Signed)
1 rx of flonase and azelastine nasal spray sent to [facility].

## 2022-10-06 NOTE — Telephone Encounter (Signed)
Patient is requesting a refill for azelastine nasal spray. She said she usually gets it from a mail order, but she is completley out and would like it to be sent to [facility] on [address].

## 2022-10-07 ENCOUNTER — Ambulatory Visit
Admission: RE | Admit: 2022-10-07 | Discharge: 2022-10-07 | Disposition: A | Discharge status: Home / Self Care | Payer: 59 | Source: Ambulatory Visit | Attending: Internal Medicine | Admitting: Internal Medicine

## 2022-10-07 VITALS — BP 163/97 | HR 101 | Temp 98.2°F | Resp 20

## 2022-10-07 DIAGNOSIS — M545 Low back pain, unspecified: Secondary | ICD-10-CM | POA: Diagnosis not present

## 2022-10-07 LAB — POCT URINALYSIS DIP (MANUAL ENTRY)
Blood, UA: NEGATIVE
Glucose, UA: NEGATIVE mg/dL
Leukocytes, UA: NEGATIVE
Nitrite, UA: NEGATIVE
Protein Ur, POC: 30 mg/dL — AB
Spec Grav, UA: 1.025 (ref 1.010–1.025)
Urobilinogen, UA: 0.2 E.U./dL
pH, UA: 6.5 (ref 5.0–8.0)

## 2022-10-07 MED ORDER — LIDOCAINE 5 % EX PTCH
1.0000 | MEDICATED_PATCH | CUTANEOUS | 0 refills | Status: DC
Start: 2022-10-07 — End: 2023-05-25

## 2022-10-07 NOTE — ED Provider Notes (Signed)
[name]    CSN: [id number] Arrival date & time: 10/07/22  1135      History   Chief Complaint Chief Complaint  Patient presents with   Back Pain    Entered by patient    HPI Chelsea Benson is a 71 y.o. female.   Patient presents with left-sided back pain that has been present for about 3 days.  She denies any obvious injury to the area.  Patient does report history of chronic back pain where she is followed by neurosurgeon.  She had lumbar fusion in January 2023.  She states that her chronic back pain typically is "all over".  This back pain is localized to the left side which is different than her typical back pain.  She denies any numbness or tingling.  Pain does not radiate.  Denies dysuria, urinary frequency, hematuria, fever.  Patient has taken Tylenol,meloxicam, and is wearing a back brace with no improvement in symptoms.   Back Pain   Past Medical History:  Diagnosis Date   Allergic rhinoconjunctivitis    Anginal pain (HCC)    Anxiety    Arthritis    Spondylolysis, knee and ankle arthritis pains.   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  He uses a walker   Depression    Diabetes mellitus without complication ([place])    dx in 2007   Diabetes mellitus, type II ([place])    Dysrhythmia    Eczema    Headache(784.0)    sinus related    History of kidney stones    History of nephrolithiasis    Hx of pulmonary embolus 2017   Hyperlipidemia    Hypertension    Insomnia    Memory change    OSA on CPAP    PSG 05/24/2016: AHI 17/hour 02 mean 76%.  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   Not well controlled severe persistent asthma 08/09/2022   HNP (herniated nucleus pulposus), lumbar 01/03/2022   Paroxysmal atrial fibrillation ([place]); CHA2DS2-VASc score 4 HTN, DM, female, age)-on Eliquis 02/24/2021   Leg cramps - Left Calf 12/31/2020   Pain of anterior chest wall with respiration 06/17/2020   Rapid palpitations 06/17/2020   Acute pharyngitis 01/13/2020   Cough variant asthma vs UACS 06/06/2019   Moderate persistent asthma with acute exacerbation 03/13/2019   Allergic rhinitis due to allergen 03/13/2019   Allergic conjunctivitis of both eyes 03/13/2019   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   Anxiety hyperventilation 10/03/2018   Bipolar II disorder ([place]) 09/18/2018   Cardiac risk counseling 01/07/2018   Influenza 10/19/2017   Cough 10/19/2017   Allergy to almonds 10/19/2017   Flexural atopic dermatitis 10/19/2017   Allergic rhinoconjunctivitis 10/19/2017   Moderate persistent asthma, uncomplicated 02/40/9735   S/P arthroscopy of right knee 06/04/2017   Acute blood loss as cause of postoperative anemia 06/04/2017   Mood  disorder ([place])    Allergy history unknown    Anxiety 05/24/2017   Diabetes mellitus ([place]) 05/24/2017   Hyperlipidemia associated with type 2 diabetes mellitus ([place]) 05/24/2017   Insomnia 05/24/2017   Primary osteoarthritis of right knee 05/22/2017   Osteoarthritis of right knee 05/18/2017   Allergic rhinitis due to pollen 10/06/2014   Arthritis 10/06/2014   Asthma 10/06/2014   Cubital tunnel syndrome, right 10/06/2014   Headache 10/06/2014   Essential hypertension 10/06/2014   Depression 08/07/2014   Postlaminectomy syndrome, lumbar region 09/06/2013   DDD (degenerative disc disease), lumbosacral 05/11/2013   Spinal stenosis of lumbar region 05/11/2013    Past Surgical  History:  Procedure Laterality Date   ABDOMINAL HYSTERECTOMY     BACK SURGERY     2012- lumbar fusion   BREAST SURGERY     L cyst removed - 1972   BUNIONECTOMY Left    Great toe   calf -R- cyst removed     CARPAL TUNNEL RELEASE     both hands    CATARACT EXTRACTION     CORONARY CT ANGIOGRAM  07/17/2019    Coronary calcium score 0.  Right dominant system.  Difficult to assess because of cardiac motion, but there is no obvious evidence of CAD.  Mild PA dilation.    HAMMER TOE SURGERY Left    L foot   NASAL SINUS SURGERY     NM MYOVIEW LTD  12/2008   Normal study.  Normal LV function.  EF 61%.  Apical thinning but no ischemia or infarction.   SHOULDER ARTHROSCOPY Left    R shoulder- RCR   TOE SURGERY     TOTAL KNEE ARTHROPLASTY Right 05/22/2017   Procedure: TOTAL KNEE ARTHROPLASTY;  Surgeon: Frederik Pear, MD;  Location: [facility];  Service: Orthopedics;  Laterality: Right;   TRANSTHORACIC ECHOCARDIOGRAM  06/2009    Technically difficult.  Moderate concentric LVH with normal LV function.  No regional wall motion normalities.  EF> 55%.  Normal right ventricle.  No valve lesions.  Normal filling pressures.   TRIGGER FINGER RELEASE     L thumb   TUBAL LIGATION      OB History   No obstetric history on file.      Home Medications    Prior to Admission medications   Medication Sig Start Date End Date Taking? Authorizing Provider  lidocaine (LIDODERM) 5 % Place 1 patch onto the skin daily. Remove & Discard patch within 12 hours or as directed by MD 10/07/22  Yes Teodora Medici, FNP  ACCU-CHEK GUIDE test strip TEST BID PRN 09/17/18   [provider]  albuterol (PROVENTIL) (2.5 MG/3ML) 0.083% nebulizer solution USE 1 VIAL IN NEBULIZER EVERY 4 HOURS - and as needed 07/06/21   Kennith Gain, MD  albuterol (VENTOLIN HFA) 108 (90 Base) MCG/ACT inhaler USE 2 INHALATIONS BY MOUTH EVERY 4 HOURS AS NEEDED FOR WHEEZING  OR SHORTNESS OF BREATH 05/30/22   Kennith Gain,  MD  amoxicillin-clavulanate (AUGMENTIN) 875-125 MG tablet Take 1 tablet by mouth every 12 (twelve) hours. 09/22/22   Francene Finders, PA-C  ARIPiprazole (ABILIFY) 2 MG tablet Take 2 tablets (4 mg total) by mouth daily. 07/22/22   Thayer Headings, PMHNP  azelastine (ASTELIN) 0.1 % nasal spray Place 2 sprays into both nostrils 2 (two) times daily. 10/06/22   Kennith Gain, MD  benzonatate (TESSALON) 100 MG capsule Take 1 capsule (100 mg total) by mouth 3 (three) times daily as needed for  cough. 09/17/22   Barrett Henle, MD  Blood Glucose Monitoring Suppl (ACCU-CHEK GUIDE) w/Device KIT See admin instructions. 09/18/18   [provider]  budesonide-formoterol (SYMBICORT) 160-4.5 MCG/ACT inhaler INHALE 2 INHALATIONS BY MOUTH  INTO THE LUNGS IN THE MORNING  AND AT BEDTIME 02/28/22   Kennith Gain, MD  busPIRone (BUSPAR) 30 MG tablet Take 1 tablet (30 mg total) by mouth 2 (two) times daily. 09/26/22 12/25/22  Thayer Headings, PMHNP  CALCIUM-VITAMIN D PO Take 1 tablet by mouth daily.    [provider]  Carbinoxamine Maleate 4 MG TABS Take 1 tablet (4 mg total) by mouth 3 (three) times daily as needed. 08/09/22   Dara Hoyer, FNP  clonazePAM (KLONOPIN) 0.5 MG tablet Take 1/2-1 tab po TID prn anxiety. (Do not combine with Ambien) 09/26/22   Thayer Headings, PMHNP  desonide (DESOWEN) 0.05 % ointment APPLY TO AFFECTED AREA(S)  TOPICALLY TWICE DAILY AS  NEEDED 02/03/21   Kennith Gain, MD  diltiazem (CARDIZEM CD) 300 MG 24 hr capsule TAKE 1 CAPSULE BY MOUTH DAILY 02/14/22   Leonie Man, MD  diltiazem (CARDIZEM) 60 MG tablet TAKE DILITAZEM 60 MG BY MOUTH AS NEEDED FOR PALPITATIONS. YOU CAN TAKE EVERY 6 HOURS AS NEEDED UP TO TWICE DAILY WITH EACH EPISODE 03/08/22   Leonie Man, MD  ELIQUIS 5 MG TABS tablet TAKE 1 TABLET BY MOUTH TWICE  DAILY 08/23/22   Leonie Man, MD  EPINEPHrine 0.3 mg/0.3 mL IJ SOAJ injection INJECT INTRAMUSCULARLY 1  PEN AS NEEDED  FOR ALLERGIC  RESPONSE AS DIRECTED BY MD. [place] AFTER  USE. 08/09/22   Ambs, Kathrine Cords, FNP  famotidine (PEPCID) 20 MG tablet TAKE 1 TABLET BY MOUTH DAILY  AFTER SUPPER 06/23/22   Kennith Gain, MD  fluticasone [facility]) 50 MCG/ACT nasal spray USE 2 SPRAYS IN BOTH NOSTRILS  DAILY 10/06/22   Kennith Gain, MD  fluticasone (FLOVENT HFA) 110 MCG/ACT inhaler Inhale 4 puffs twice a day with a spacer for the next 2 weeks or until cough and wheeze free 08/09/22   Dara Hoyer, FNP  hydrochlorothiazide (HYDRODIURIL) 25 MG tablet TAKE 1 TABLET BY MOUTH DAILY 09/19/22   Leonie Man, MD  meloxicam (MOBIC) 7.5 MG tablet Take 1 tablet (7.5 mg total) by mouth daily. 12/10/21   Francene Finders, PA-C  Mepolizumab (NUCALA) 100 MG/ML SOAJ Inject 1 mL (100 mg total) into the skin every 28 (twenty-eight) days. 01/24/22   Kennith Gain, MD  metFORMIN (GLUCOPHAGE-XR) 500 MG 24 hr tablet Take 500 mg by mouth in the morning and at bedtime. 07/02/19   [provider]  mirtazapine (REMERON) 30 MG tablet TAKE 1 TABLET BY MOUTH AT  BEDTIME 03/22/22   Thayer Headings, PMHNP  montelukast (SINGULAIR) 10 MG tablet TAKE 1 TABLET BY MOUTH AT  BEDTIME 09/27/22   Kennith Gain, MD  Oxcarbazepine (TRILEPTAL) 300 MG tablet Take 2 tablets (600 mg total) by mouth at bedtime. 07/22/22   Thayer Headings, PMHNP  oxybutynin (DITROPAN-XL) 10 MG 24 hr tablet Take 10 mg by mouth daily. 12/17/21   [provider]  pantoprazole (PROTONIX) 40 MG tablet TAKE 1 TABLET BY MOUTH  DAILY. TAKE 30 TO 60  MINUTES BEFORE FIRST MEAL  OF THE DAY 10/25/19   Tanda Rockers, MD  potassium chloride SA (KLOR-CON M) 20 MEQ tablet TAKE 1 TABLET BY MOUTH DAILY 05/23/22   Leonie Man, MD  pravastatin (PRAVACHOL) 20  MG tablet Take 1 tablet (20 mg total) by mouth at bedtime. 09/29/21   Leonie Man, MD  pregabalin (LYRICA) 100 MG capsule Take 100 mg by mouth 3 (three) times daily.    [provider]  Respiratory Therapy Supplies (NEBULIZER) DEVI Use as directed with nebulizer solution. 06/01/18   Valentina Shaggy, MD  Respiratory Therapy Supplies (NEBULIZER/TUBING/MOUTHPIECE) KIT Use as directed with nebulizer machine 04/17/20   Padgett, Rae Halsted, MD  Semaglutide, 1 MG/DOSE, (OZEMPIC, 1 MG/DOSE,) 2 MG/1.5ML SOPN Take 1 mg by mouth once a week.    [provider]  triamcinolone (NASACORT) 55 MCG/ACT AERO nasal inhaler Place 2 sprays into the nose daily. 09/14/22   Ambs, Kathrine Cords, FNP  zolpidem (AMBIEN) 10 MG tablet Take 0.5-1 tablets (5-10 mg total) by mouth at bedtime as needed. for sleep 07/26/22   Thayer Headings, PMHNP    Family History Family History  Problem Relation Age of Onset   Diabetes Sister    Diabetes Brother    Hypertension Brother    Cancer Maternal Grandmother    Heart failure Maternal Grandmother    Hypertension Paternal Grandmother    Asthma Daughter    Cancer Daughter    Depression Daughter    Hypertension Daughter    Heart failure Maternal Aunt    Pneumonia Mother    Diabetes Mother    Alcoholism Father    Alcohol abuse Father    Depression Daughter    Schizophrenia Grandchild    Allergies Neg Hx    Eczema Neg Hx    Immunodeficiency Neg Hx     Social History Social History   Tobacco Use   Smoking status: Never    Passive exposure: Yes   Smokeless tobacco: Never   Tobacco comments:    grandson smokes, but in his car  Vaping Use   Vaping Use: Never used  Substance Use Topics   Alcohol use: Yes    Comment: rare use   Drug use: No     Allergies   Ramipril, Cymbalta [duloxetine hcl], Methylprednisolone acetate, Rosuvastatin calcium, Almond meal, Almond oil, Carvedilol, Fish allergy, Losartan potassium, and Morphine and related   Review of Systems Review of Systems Per HPI  Physical Exam Triage Vital Signs ED Triage Vitals  Enc Vitals Group     BP 10/07/22 1146 (!) 163/97     Pulse Rate 10/07/22 1146  (!) 110     Resp 10/07/22 1146 20     Temp 10/07/22 1146 98.2 F (36.8 C)     Temp Source 10/07/22 1146 Oral     SpO2 10/07/22 1146 98 %     Weight --      Height --      Head Circumference --      Peak Flow --      Pain Score 10/07/22 1145 10     Pain Loc --      Pain Edu? --      Excl. in [place]? --    No data found.  Updated Vital Signs BP (!) 163/97   Pulse (!) 101   Temp 98.2 F (36.8 C) (Oral)   Resp 20   SpO2 98%   Visual Acuity Right Eye Distance:   Left Eye Distance:   Bilateral Distance:    Right Eye Near:   Left Eye Near:    Bilateral Near:     Physical Exam Constitutional:      General: She is not in acute distress.  Appearance: Normal appearance. She is not toxic-appearing or diaphoretic.  HENT:     Head: Normocephalic and atraumatic.  Eyes:     Extraocular Movements: Extraocular movements intact.     Conjunctiva/sclera: Conjunctivae normal.  Pulmonary:     Effort: Pulmonary effort is normal.  Musculoskeletal:       Back:     Comments: Patient has tenderness to palpation throughout left lower back.  No direct spinal tenderness, crepitus, step-off.  Patient has multiple surgical scars throughout the lower back as well.  No obvious swelling, discoloration, lacerations, abrasions noted.  Neurological:     General: No focal deficit present.     Mental Status: She is alert and oriented to person, place, and time. Mental status is at baseline.     Deep Tendon Reflexes: Reflexes are normal and symmetric.  Psychiatric:        Mood and Affect: Mood normal.        Behavior: Behavior normal.        Thought Content: Thought content normal.        Judgment: Judgment normal.      UC Treatments / Results  Labs (all labs ordered are listed, but only abnormal results are displayed) Labs Reviewed  POCT URINALYSIS DIP (MANUAL ENTRY) - Abnormal; Notable for the following components:      Result Value   Bilirubin, UA small (*)    Ketones, POC UA large (80)  (*)    Protein Ur, POC =30 (*)    All other components within normal limits  COMPREHENSIVE METABOLIC PANEL    EKG   Radiology No results found.  Procedures Procedures (including critical care time)  Medications Ordered in UC Medications - No data to display  Initial Impression / Assessment and Plan / UC Course  I have reviewed the triage vital signs and the nursing notes.  Pertinent labs & imaging results that were available during my care of the patient were reviewed by me and considered in my medical decision making (see chart for details).     UA did not show any signs of urinary tract infection.  It did show some ketones and protein so CMP is pending to rule out any worrisome etiologies.  Suspect muscle strain versus flareup of chronic back pain.  Limited options on pain management given patient's chronic health problems and her daily prescription medications.  Topical medications will be safest so lidocaine was prescribed for patient.  She has mildly elevated blood pressure and heart rate which is most likely related to pain. These vitals also appear baseline.  No concern for cardiac etiology or need for further work-up at this time.  Patient was advised to follow-up with her neurosurgeon who follows her for her chronic back pain if symptoms persist or worsen.  Discussed return precautions.  Patient verbalized understanding and was agreeable with plan. Final Clinical Impressions(s) / UC Diagnoses   Final diagnoses:  Acute left-sided low back pain without sciatica     Discharge Instructions      Your urine was clear.  I have prescribed you lidocaine patches to apply directly to area.  Follow-up with your neurosurgeon if symptoms persist or worsen.  Blood work is pending.  We will call if there are any abnormalities.    ED Prescriptions     Medication Sig Dispense Auth. Provider   lidocaine (LIDODERM) 5 % Place 1 patch onto the skin daily. Remove & Discard patch within  12 hours or as directed by MD 30  patch Teodora Medici, [place]      PDMP not reviewed this encounter.   Teodora Medici, [place] 10/07/22 1230

## 2022-10-07 NOTE — Discharge Instructions (Addendum)
Your urine was clear.  I have prescribed you lidocaine patches to apply directly to area.  Follow-up with your neurosurgeon if symptoms persist or worsen.  Blood work is pending.  We will call if there are any abnormalities.

## 2022-10-07 NOTE — ED Triage Notes (Signed)
Pt presents to uc with co of back pain for 3 days, pt is concerned for a pulled muscle on the right middle/low back. Pt denies dysuria. Pain is 10/10 and has attempted tylenol and heat with minimal improvement. Pt also has taken meloxicam without assistance and wearing a back brace.

## 2022-10-08 LAB — COMPREHENSIVE METABOLIC PANEL
ALT: 15 IU/L (ref 0–32)
AST: 18 IU/L (ref 0–40)
Albumin/Globulin Ratio: 2 (ref 1.2–2.2)
Albumin: 4.7 g/dL (ref 3.9–4.9)
Alkaline Phosphatase: 56 IU/L (ref 44–121)
BUN/Creatinine Ratio: 22 (ref 12–28)
BUN: 14 mg/dL (ref 8–27)
Bilirubin Total: <0.2 mg/dL (ref 0.0–1.2)
CO2: 21 mmol/L (ref 20–29)
Calcium: 9.8 mg/dL (ref 8.7–10.3)
Chloride: 106 mmol/L (ref 96–106)
Creatinine, Ser: 0.64 mg/dL (ref 0.57–1.00)
Globulin, Total: 2.3 g/dL (ref 1.5–4.5)
Glucose: 148 mg/dL — ABNORMAL HIGH (ref 70–99)
Potassium: 3.8 mmol/L (ref 3.5–5.2)
Sodium: 141 mmol/L (ref 134–144)
Total Protein: 7 g/dL (ref 6.0–8.5)
eGFR: 95 mL/min/{1.73_m2} (ref >59)

## 2022-10-10 ENCOUNTER — Telehealth: Payer: Self-pay | Admitting: Family Medicine

## 2022-10-10 NOTE — Telephone Encounter (Signed)
Patient is requesting PA to be completed for symbicort.   Please advise

## 2022-10-11 NOTE — Telephone Encounter (Signed)
PA has been submitted through CoverMyMeds for Symbicort and is currently pending approval/denial. Called patient and informed of PA and that will call when hear back from insurance, patient verbalized understanding.

## 2022-10-12 ENCOUNTER — Ambulatory Visit: Payer: 59 | Admitting: Psychiatry

## 2022-10-12 ENCOUNTER — Other Ambulatory Visit (HOSPITAL_COMMUNITY): Payer: Self-pay

## 2022-10-12 NOTE — Telephone Encounter (Signed)
Chelsea Benson, I didn't see any mention of switching inhalers, did you mean to document this for another patient?

## 2022-10-12 NOTE — Telephone Encounter (Signed)
Per insurance, PA for Symbicort has been cancelled due to the medication being on the list of covered drugs.  Letter has been attached in patients documents.   Per test claims, Brand Symbicort is covered.

## 2022-10-12 NOTE — Telephone Encounter (Signed)
PLEASE ADVISE TO CHANGE FROM FLOVENT TO [facility] BRAND

## 2022-10-13 ENCOUNTER — Other Ambulatory Visit: Payer: Self-pay

## 2022-10-13 MED ORDER — BUDESONIDE-FORMOTEROL FUMARATE 160-4.5 MCG/ACT IN AERO: INHALATION_SPRAY | RESPIRATORY_TRACT | 11 refills | Status: DC

## 2022-10-13 NOTE — Telephone Encounter (Signed)
Raelyn Number can you run a test claims for wixela, symbicort and dulera and let us know possible copay? Thank you

## 2022-10-13 NOTE — Telephone Encounter (Signed)
Please see previous notes.  Brand Symbicort is covered under the patients current plan.

## 2022-10-13 NOTE — Telephone Encounter (Signed)
LVM letting patient know that her medication will be delivered between Nov 14 and the 17th.

## 2022-10-13 NOTE — Telephone Encounter (Signed)
Can you please find out which duel inhaler is covered? Symbicort, dulera, wixela. Brand or generic please. Please let me know what ever duel inhaler she can get. Thank you

## 2022-10-13 NOTE — Telephone Encounter (Signed)
Patient called again about her Symbicort. Spoke to the pharmacy and let them know to run the brand name of Symbicort because that is what is covered by insurance. Resent prescription because the pharmacy states that they do not have the script with the 11 refills from March. Pharmacy states that it will be be delivered Nov 14th and no later than the 17th. Order # [id number]. Spoke to [place] at [facility]

## 2022-10-13 NOTE — Telephone Encounter (Signed)
Thank you :)

## 2022-10-13 NOTE — Telephone Encounter (Signed)
Brand symbicort is preferred

## 2022-10-27 ENCOUNTER — Other Ambulatory Visit: Payer: Self-pay | Admitting: Cardiology

## 2022-11-02 ENCOUNTER — Ambulatory Visit: Payer: Medicare Other | Admitting: Allergy

## 2022-11-04 ENCOUNTER — Telehealth: Payer: Self-pay | Admitting: Allergy

## 2022-11-04 NOTE — Telephone Encounter (Signed)
Informed pt and she said she has a follow up this month and will go into further detail with you

## 2022-11-04 NOTE — Telephone Encounter (Signed)
Patient states the Bepotastine eye drops are not helping her eye allergies. She woke up this morning with her right eye matted, itchy and red. She is wondering if theres an alternative eye drop that she can switch to.

## 2022-11-04 NOTE — Telephone Encounter (Signed)
Please advise to different eye drop thank you

## 2022-11-09 ENCOUNTER — Ambulatory Visit (INDEPENDENT_AMBULATORY_CARE_PROVIDER_SITE_OTHER): Payer: 59 | Admitting: Psychiatry

## 2022-11-09 DIAGNOSIS — F419 Anxiety disorder, unspecified: Secondary | ICD-10-CM | POA: Diagnosis not present

## 2022-11-09 NOTE — Progress Notes (Signed)
[facility] Counselor/Therapist Progress Note  Patient ID: Chelsea Benson, MRN: [id number],    Date: 11/09/2022  Time Spent: 48 minutes   Virtual Visit via Telehealth Note:My Chart Video Connected with patient by a telemedicine/telehealth application, with their informed consent, and verified patient privacy and that I am speaking with the correct person using two identifiers. I discussed the limitations, risks, security and privacy concerns of performing psychotherapy and the availability of in person appointments. I also discussed with the patient that there may be a patient responsible charge related to this service. The patient expressed understanding and agreed to proceed. I discussed the treatment planning with the patient. The patient was provided an opportunity to ask questions and all were answered. The patient agreed with the plan and demonstrated an understanding of the instructions. The patient was advised to call  our office if  symptoms worsen or feel they are in a crisis state and need immediate contact.   Therapist Location: office Patient Location: home   Treatment Type: Individual Therapy  Reported Symptoms: anxiety, some turmoil with brother and adult grandson  Mental Status Exam:  Appearance:   Casual     Behavior:  Appropriate, Sharing, and Motivated  Motor:  Often uses cane or rollator in walking  Speech/Language:   Clear and Coherent  Affect:  anxious  Mood:  anxious  Thought process:  goal directed  Thought content:    Rumination  Sensory/Perceptual disturbances:    WNL  Orientation:  oriented to person, place, time/date, situation, day of week, month of year, year, and stated date of Dec. 6, 2023  Attention:  Good  Concentration:  Good and Fair  Memory:  WNL  Fund of knowledge:   Good  Insight:    Good and Fair  Judgment:   Good  Impulse Control:  Good and Fair   Risk Assessment: Danger to Self:  No Self-injurious Behavior: No Danger to  Others: No Duty to Warn:no Physical Aggression / Violence:No  Access to Firearms a concern: No  Gang Involvement:No   Subjective: Patient today working on Boundary setting specifically re: relationship with alcoholic brother and adult grandson who have been staying at her house.  Focused on changing some of her daily habits to better manage her anxiety, including strengthening her boundaries as noted above, especially within the extended family. Worked with some specific examples that patient has experienced at her home, and processed how she could use healthier boundaries with particular family members and have the support of the other family members who do respect her boundaries.  States she is doing a little better than she was the last time we spoke and had a back episode since that appointment but her back is doing better now.  Stated later that there was "a kidney issue that has resolved".  Not much contact with the daughter who was verbally hurtful to patient previously.  Patient adds that she thinks that daughter will be involved in family activities during the holidays.  Encouraged her on good self-care along with the boundary setting and positive self talk, and staying in contact with family members that are supportive of her.  Interventions: Cognitive Behavioral Therapy, Solution-Oriented/Positive Psychology, and Ego-Supportive  Long term goal: (measurable) Reduce overall level, frequency, and intensity of the anxiety so that daily functioning is not impaired.  Patient will eventually report a rating of "4" or less" on 1-10 anxiety scale for a 45-month time period where her daily functioning is not impaired.  Short term goal: Increase understanding of beliefs and messages that produce the worry and anxiety. Strategy: Patient will explore and work to stop cognitive messages that feed anxiety and work to replace them with more positive and empowering messages    Diagnosis:   ICD-10-CM    1. Anxiety disorder, unspecified type  F41.9      Plan: Patient today actively participating in session and showing good motivation.  Focused further on some difficult family interactions and communication, as well as family members that are disrespectful of patient on occasions.  Continue to focus on more appropriate and effective boundaries with others including family, without guilt.  Expressed appreciation of her being able to talk through her concerns and get nonjudgmental feedback.  To follow through on some improved self-care as discussed in session.  Also to be in more frequent contact with supportive friends including a sister out of state and a friend in [place] as she does feel very supported when they are able to talk.  To continue goal-directed behaviors aimed at helping her feel more stable and moving forward eventually in a more positive direction, while also maintaining good self-care and making her personal boundaries healthier. Encourage patient in practicing more positive and self affirming behaviors as noted in session including: Staying in contact with supportive people, continue to lessen her exposure to disturbing news on TV-year-old or online as this easily feeds anxiety, stay on her prescribed medication, trusting herself and recognizing her strengths versus limitations, know that she has the support and care of family members, set limits with others who are unhealthy for her, get outside on her porch when possible and good weather as that is a place she really enjoys, healthy nutrition, remain in the present focusing on what she can change or control versus going back to the past or jumping too far ahead, encouraging self talk, and realize the strength she shows working with goal-directed behaviors to move in a direction that supports her improved emotional health and overall wellbeing.  Goal review and progress/challenges noted with patient.  Next appointment within 3  weeks.  This record has been created using [employer].  Chart creation errors have been sought, but may not always have been located and corrected.  Such creation errors do not reflect on the standard of medical care provided.    Mathis Fare, LCSW

## 2022-11-22 ENCOUNTER — Encounter: Payer: Self-pay | Admitting: Psychiatry

## 2022-11-22 ENCOUNTER — Telehealth (INDEPENDENT_AMBULATORY_CARE_PROVIDER_SITE_OTHER): Payer: 59 | Admitting: Psychiatry

## 2022-11-22 DIAGNOSIS — F410 Panic disorder [episodic paroxysmal anxiety] without agoraphobia: Secondary | ICD-10-CM

## 2022-11-22 DIAGNOSIS — F3181 Bipolar II disorder: Secondary | ICD-10-CM | POA: Diagnosis not present

## 2022-11-22 DIAGNOSIS — F419 Anxiety disorder, unspecified: Secondary | ICD-10-CM

## 2022-11-22 DIAGNOSIS — F5101 Primary insomnia: Secondary | ICD-10-CM

## 2022-11-22 MED ORDER — ARIPIPRAZOLE 2 MG PO TABS: 4.0000 mg | ORAL_TABLET | Freq: Every day | ORAL | 0 refills | Status: DC

## 2022-11-22 MED ORDER — CLONAZEPAM 0.5 MG PO TABS: ORAL_TABLET | ORAL | 1 refills | Status: DC

## 2022-11-22 MED ORDER — OXCARBAZEPINE 300 MG PO TABS: 600.0000 mg | ORAL_TABLET | Freq: Every day | ORAL | 1 refills | Status: DC

## 2022-11-22 MED ORDER — BUSPIRONE HCL 30 MG PO TABS: 30.0000 mg | ORAL_TABLET | Freq: Two times a day (BID) | ORAL | 0 refills | Status: DC

## 2022-11-22 MED ORDER — MIRTAZAPINE 30 MG PO TABS: ORAL_TABLET | ORAL | 3 refills | Status: DC

## 2022-11-22 NOTE — Progress Notes (Signed)
Chelsea Benson [id number] 1951/07/31 71 y.o.  Virtual Visit via Video Note  I connected with Chelsea Benson @ on 11/22/22 at  9:30 AM EST by a video enabled telemedicine application and verified that I am speaking with the correct person using two identifiers.   I discussed the limitations of evaluation and management by telemedicine and the availability of in person appointments. The patient expressed understanding and agreed to proceed.  I discussed the assessment and treatment plan with the patient. The patient was provided an opportunity to ask questions and all were answered. The patient agreed with the plan and demonstrated an understanding of the instructions.   The patient was advised to call back or seek an in-person evaluation if the symptoms worsen or if the condition fails to improve as anticipated.  I provided 32 minutes of non-face-to-face time during this encounter.  The patient was located at home.  The provider was located at [place].   Thayer Headings, PMHNP   Subjective:   Patient ID:  Chelsea Benson is a 71 y.o. (DOB 1951/02/01) female.  Chief Complaint:  Chief Complaint  Patient presents with   Anxiety   Insomnia    HPI Chelsea Benson presents for follow-up of anxiety, mood disturbance, and insomnia.   Chelsea Benson and Chelsea Benson daughter are not speaking after Chelsea Benson reported concerns about daughter's boyfriend. Chelsea Benson just learned yesterday that Chelsea Benson will not be able to see Chelsea Benson daughter's children for Christmas. Chelsea Benson reports that Chelsea Benson brother is off antidepressant, not doing well, and drinking more. Chelsea Benson is worried about his health and he is not responding to Chelsea Benson requests to take care of himself. Chelsea Benson recently went to [place] and was not able to enjoy it due to getting frequent calls. "But I have been handling it pretty well." Chelsea Benson reports that Chelsea Benson medication and therapy have been helping Chelsea Benson cope with this stressor.   Chelsea Benson reports that Chelsea Benson has had some panic attacks and worry in  response to stressors. Chelsea Benson reports that Chelsea Benson had a panic attack last night. Panic attacks are occurring almost daily. "I don't know how to deal with it." Chelsea Benson reports that Chelsea Benson withdrew yesterday and cried after learning Chelsea Benson could not see Chelsea Benson grandchildren on Christmas day. Chelsea Benson reports sadness in response to this situation- "not depression. Denies irritability. Chelsea Benson did not sleep well last night and had frequent awakenings. Chelsea Benson reports that Chelsea Benson has been having more middle of the night awakenings and may be awake several hours before returning to sleep. Some early morning awakenings. Chelsea Benson reports that Chelsea Benson has decreased appetite and is having to make herself eat because of DM. Low motivation today and motivation was ok before yesterday. Chelsea Benson reports difficulty with concentration. Denies SI.   Ambien last filled 11/11/22 for 90 day supply x 2. Klonopin 0.5 mg 10/06/22 x 1.   Past Psychiatric Medication Trials: Cymbalta-hair loss Mirtazapine Prozac Trazodone-ineffective Ambien Belsomra BuSpar Hydroxyzine Abilify Trileptal Xanax  Review of Systems:  Review of Systems  Musculoskeletal:  Negative for gait problem.  Allergic/Immunologic: Positive for environmental allergies.  Neurological:  Negative for tremors.  Psychiatric/Behavioral:         Please refer to HPI    Medications: I have reviewed the patient's current medications.  Current Outpatient Medications  Medication Sig Dispense Refill   albuterol (PROVENTIL) (2.5 MG/3ML) 0.083% nebulizer solution USE 1 VIAL IN NEBULIZER EVERY 4 HOURS - and as needed 75 mL 0   albuterol (VENTOLIN HFA) 108 (90 Base) MCG/ACT inhaler USE 2  INHALATIONS BY MOUTH EVERY 4 HOURS AS NEEDED FOR WHEEZING  OR SHORTNESS OF BREATH 26.8 g 4   azelastine (ASTELIN) 0.1 % nasal spray USE 2 SPRAYS IN EACH NOSTRIL TWICE DAILY 90 mL 1   budesonide-formoterol (SYMBICORT) 160-4.5 MCG/ACT inhaler INHALE 2 INHALATIONS BY MOUTH  INTO THE LUNGS IN THE MORNING  AND AT BEDTIME 10.2 g  11   CALCIUM-VITAMIN D PO Take 1 tablet by mouth daily.     Carbinoxamine Maleate 4 MG TABS Take 1 tablet (4 mg total) by mouth 3 (three) times daily as needed. 180 tablet 1   desonide (DESOWEN) 0.05 % ointment APPLY TO AFFECTED AREA(S)  TOPICALLY TWICE DAILY AS  NEEDED 180 g 1   diltiazem (CARDIZEM CD) 300 MG 24 hr capsule TAKE 1 CAPSULE BY MOUTH DAILY 100 capsule 2   diltiazem (CARDIZEM) 60 MG tablet TAKE DILITAZEM 60 MG BY MOUTH AS NEEDED FOR PALPITATIONS. YOU CAN TAKE EVERY 6 HOURS AS NEEDED UP TO TWICE DAILY WITH EACH EPISODE 60 tablet 7   doxycycline (VIBRAMYCIN) 100 MG capsule Take 100 mg by mouth 2 (two) times daily.     ELIQUIS 5 MG TABS tablet TAKE 1 TABLET BY MOUTH TWICE  DAILY 200 tablet 2   famotidine (PEPCID) 20 MG tablet TAKE 1 TABLET BY MOUTH DAILY  AFTER SUPPER 100 tablet 2   fluticasone (FLONASE) 50 MCG/ACT nasal spray USE 2 SPRAYS IN BOTH NOSTRILS  DAILY 16 g 0   hydrochlorothiazide (HYDRODIURIL) 25 MG tablet TAKE 1 TABLET BY MOUTH DAILY 90 tablet 3   meloxicam (MOBIC) 7.5 MG tablet Take 1 tablet (7.5 mg total) by mouth daily. 30 tablet 0   Mepolizumab (NUCALA) 100 MG/ML SOAJ Inject 1 mL (100 mg total) into the skin every 28 (twenty-eight) days. 1 mL 11   metFORMIN (GLUCOPHAGE-XR) 500 MG 24 hr tablet Take 500 mg by mouth in the morning and at bedtime.     montelukast (SINGULAIR) 10 MG tablet TAKE 1 TABLET BY MOUTH AT  BEDTIME 90 tablet 1   oxybutynin (DITROPAN-XL) 10 MG 24 hr tablet Take 10 mg by mouth daily.     pantoprazole (PROTONIX) 40 MG tablet TAKE 1 TABLET BY MOUTH  DAILY. TAKE 30 TO 60  MINUTES BEFORE FIRST MEAL  OF THE DAY 90 tablet 3   potassium chloride SA (KLOR-CON M) 20 MEQ tablet TAKE 1 TABLET BY MOUTH DAILY 100 tablet 2   pravastatin (PRAVACHOL) 20 MG tablet Take 1 tablet (20 mg total) by mouth at bedtime. 90 tablet 3   pregabalin (LYRICA) 100 MG capsule Take 100 mg by mouth 3 (three) times daily.     Semaglutide, 1 MG/DOSE, (OZEMPIC, 1 MG/DOSE,) 2 MG/1.5ML SOPN  Take 1 mg by mouth once a week.     zolpidem (AMBIEN) 10 MG tablet Take 0.5-1 tablets (5-10 mg total) by mouth at bedtime as needed. for sleep 90 tablet 1   ACCU-CHEK GUIDE test strip TEST BID PRN  1   ARIPiprazole (ABILIFY) 2 MG tablet Take 2 tablets (4 mg total) by mouth daily. 180 tablet 0   Blood Glucose Monitoring Suppl (ACCU-CHEK GUIDE) w/Device KIT See admin instructions.  1   busPIRone (BUSPAR) 30 MG tablet Take 1 tablet (30 mg total) by mouth 2 (two) times daily. 180 tablet 0   [START ON 12/29/2022] clonazePAM (KLONOPIN) 0.5 MG tablet Take 1/2-1 tab po TID prn anxiety. (Do not combine with Ambien) 270 tablet 1   EPINEPHrine 0.3 mg/0.3 mL IJ SOAJ injection INJECT  INTRAMUSCULARLY 1  PEN AS NEEDED FOR ALLERGIC  RESPONSE AS DIRECTED BY MD. SEEK MEDICAL HELP AFTER  USE. 2 each 0   fluticasone (FLOVENT HFA) 110 MCG/ACT inhaler Inhale 4 puffs twice a day with a spacer for the next 2 weeks or until cough and wheeze free (Patient not taking: Reported on 11/22/2022) 1 each 0   lidocaine (LIDODERM) 5 % Place 1 patch onto the skin daily. Remove & Discard patch within 12 hours or as directed by MD (Patient not taking: Reported on 11/22/2022) 30 patch 0   mirtazapine (REMERON) 30 MG tablet TAKE 1 TABLET BY MOUTH AT  BEDTIME 90 tablet 3   Oxcarbazepine (TRILEPTAL) 300 MG tablet Take 2 tablets (600 mg total) by mouth at bedtime. 180 tablet 1   Respiratory Therapy Supplies (NEBULIZER) DEVI Use as directed with nebulizer solution. 1 each 1   Respiratory Therapy Supplies (NEBULIZER/TUBING/MOUTHPIECE) KIT Use as directed with nebulizer machine 1 kit 12   triamcinolone (NASACORT) 55 MCG/ACT AERO nasal inhaler Place 2 sprays into the nose daily. (Patient not taking: Reported on 11/22/2022) 16.9 mL 5   No current facility-administered medications for this visit.    Medication Side Effects: None  Allergies:  Allergies  Allergen Reactions   Ramipril Other (See Comments)   Cymbalta [Duloxetine Hcl] Other (See  Comments)    Caused hair loss   Methylprednisolone Acetate Hives and Rash    05/29/20 developed hives "all over my back" after MBB; resolved with Benadryl and "a few days."  Can tolerate Dexamethasone/Decadron. 05/29/20 developed hives "all over my back" after MBB; resolved with Benadryl and "a few days."  Can tolerate Dexamethasone/Decadron.   Rosuvastatin Calcium Other (See Comments) and Cough    Rapid heart rate   Almond Meal Itching and Swelling    Tongue swells and itches   Almond Oil Itching and Swelling    Itching and swelling of tongue    Carvedilol Cough   Fish Allergy Itching    Halibut fish   Losartan Potassium Cough   Morphine And Related Itching    Past Medical History:  Diagnosis Date   Allergic rhinoconjunctivitis    Anginal pain (HCC)    Anxiety    Arthritis    Spondylolysis, knee and ankle arthritis pains.   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  He uses a walker   Depression    Diabetes mellitus without complication ([place])    dx in 2007   Diabetes mellitus, type II ([place])    Dysrhythmia    Eczema    Headache(784.0)    sinus related    History of kidney stones    History of nephrolithiasis    Hx of pulmonary embolus 2017   Hyperlipidemia    Hypertension    Insomnia    Memory change    OSA on CPAP    PSG 05/24/2016: AHI 17/hour 02 mean 76%.  HSAT 06/13/17, AHI 11-hour 02 mean 79%   Paroxysmal atrial fibrillation (HCC) 02/24/2021   CHA2DS2-VASc score 4 (HTN, DM, female, age-5); on Eliquis   Pneumonia    Tremor of both hands     Family History  Problem Relation Age of Onset  Diabetes Sister    Diabetes Brother    Hypertension Brother    Cancer Maternal Grandmother    Heart failure  Maternal Grandmother    Hypertension Paternal Grandmother    Asthma Daughter    Cancer Daughter    Depression Daughter    Hypertension Daughter    Heart failure Maternal Aunt    Pneumonia Mother    Diabetes Mother    Alcoholism Father    Alcohol abuse Father    Depression Daughter    Schizophrenia Grandchild    Allergies Neg Hx    Eczema Neg Hx    Immunodeficiency Neg Hx     Social History   Socioeconomic History   Marital status: Single    Spouse name: Not on file   Number of children: 2   Years of education: Not on file   Highest education level: Some college, no degree  Occupational History    Comment: NA  Tobacco Use   Smoking status: Never    Passive exposure: Yes   Smokeless tobacco: Never   Tobacco comments:    grandson smokes, but in his car  Vaping Use   Vaping Use: Never used  Substance and Sexual Activity   Alcohol use: Yes    Comment: rare use   Drug use: No   Sexual activity: Not Currently  Other Topics Concern   Not on file  Social History Narrative   Chelsea Benson is currently single.  Has 2 girls.  Has some college education.  Currently disabled due to chronic back pain and not employed.   07/01/19 lives with brother Shanon Brow   Caffeine, none   Social Determinants of Health   Financial Resource Strain: Not on file  Food Insecurity: Not on file  Transportation Needs: Not on file  Physical Activity: Not on file  Stress: Not on file  Social Connections: Not on file  Intimate Partner Violence: Not on file    Past Medical History, Surgical history, Social history, and Family history were reviewed and updated as appropriate.   Please see review of systems for further details on the patient's review from today.   Objective:   Physical Exam:  There were no vitals taken for this visit.  Physical Exam Neurological:     Mental Status: Chelsea Benson is alert and oriented to person, place, and time.     Cranial Nerves: No dysarthria.  Psychiatric:        Attention  and Perception: Attention and perception normal.        Mood and Affect: Mood is anxious. Affect is tearful.        Speech: Speech normal.        Behavior: Behavior is cooperative.        Thought Content: Thought content normal. Thought content is not paranoid or delusional. Thought content does not include homicidal or suicidal ideation. Thought content does not include homicidal or suicidal plan.        Cognition and Memory: Cognition and memory normal.        Judgment: Judgment normal.     Comments: Insight intact Mood is sad and anxious when discussing not being able to see Chelsea Benson grandchildren for Christmas     Lab Review:     Component Value Date/Time   NA 141 10/07/2022 1219   K 3.8 10/07/2022 1219   CL 106 10/07/2022 1219   CO2 21 10/07/2022 1219   GLUCOSE 148 (H) 10/07/2022 1219   GLUCOSE 149 (H) 12/23/2021 1116  BUN 14 10/07/2022 1219   CREATININE 0.64 10/07/2022 1219   CALCIUM 9.8 10/07/2022 1219   PROT 7.0 10/07/2022 1219   ALBUMIN 4.7 10/07/2022 1219   AST 18 10/07/2022 1219   ALT 15 10/07/2022 1219   ALKPHOS 56 10/07/2022 1219   BILITOT <0.2 10/07/2022 1219   GFRNONAA >60 12/23/2021 1116   GFRAA 108 07/06/2020 0834       Component Value Date/Time   WBC 4.2 12/23/2021 1116   RBC 4.74 12/23/2021 1116   HGB 12.5 12/23/2021 1116   HGB 12.5 11/08/2018 1041   HCT 40.8 12/23/2021 1116   HCT 39.5 11/08/2018 1041   PLT 297 12/23/2021 1116   MCV 86.1 12/23/2021 1116   MCV 81 11/08/2018 1041   MCH 26.4 12/23/2021 1116   MCHC 30.6 12/23/2021 1116   RDW 15.7 (H) 12/23/2021 1116   RDW 14.3 11/08/2018 1041   LYMPHSABS 2.1 04/28/2019 1712   LYMPHSABS 1.4 11/08/2018 1041   MONOABS 0.5 04/28/2019 1712   EOSABS 0.3 04/28/2019 1712   EOSABS 0.2 11/08/2018 1041   BASOSABS 0.0 04/28/2019 1712   BASOSABS 0.0 11/08/2018 1041    No results found for: "POCLITH", "LITHIUM"   No results found for: "PHENYTOIN", "PHENOBARB", "VALPROATE", "CBMZ"   .res Assessment: Plan:    Chelsea Benson seen for 32 minutes and time spent discussing recent exacerbation in anxiety and insomnia after acute family stressor. Chelsea Benson reports that Chelsea Benson would like to continue current medications without changes since Chelsea Benson feels that Chelsea Benson would have severe depression, anxiety, and insomnia if Chelsea Benson were not taking medications and that Chelsea Benson is managing better than Chelsea Benson would expect.  Discussed Ambien CR if Chelsea Benson continues to experience middle of the night awakenings. Chelsea Benson reports that Chelsea Benson does not want to make a med change at this time "because there are some things I just need to work through." Advised Chelsea Benson to contact office if insomnia worsens or does not improve.  Continue Abilify 4 mg po qd for mood s/s.  Continue Buspar 30 mg po BID for anxiety. Continue Klonopin 0.5 mg 1/2-1 tab prn panic. Continue Dayvigo 10 mg at bedtime for insomnia.  Continue Remeron 30 mg po qhs for depression and insomnia.  Continue Trileptal 600 mg po QHS for mood stabilization and anxiety.  Continue Ambien 10 mg 1/2-1 tab po QHS prn insomnia.  Recommend continuing therapy with Rinaldo Cloud, LCSW.  Chelsea Benson to follow-up with this provider in 4 weeks or sooner if clinically indicated.  Patient advised to contact office with any questions, adverse effects, or acute worsening in signs and symptoms.  Chelsea Benson was seen today for anxiety and insomnia.  Diagnoses and all orders for this visit:  Anxiety disorder, unspecified type -     busPIRone (BUSPAR) 30 MG tablet; Take 1 tablet (30 mg total) by mouth 2 (two) times daily. -     Oxcarbazepine (TRILEPTAL) 300 MG tablet; Take 2 tablets (600 mg total) by mouth at bedtime.  Primary insomnia -     mirtazapine (REMERON) 30 MG tablet; TAKE 1 TABLET BY MOUTH AT  BEDTIME  Bipolar II disorder (HCC) -     ARIPiprazole (ABILIFY) 2 MG tablet; Take 2 tablets (4 mg total) by mouth daily. -     mirtazapine (REMERON) 30 MG tablet; TAKE 1 TABLET BY MOUTH AT  BEDTIME  Panic disorder -     clonazePAM (KLONOPIN)  0.5 MG tablet; Take 1/2-1 tab po TID prn anxiety. (Do not combine with Ambien)     Please  see After Visit Summary for patient specific instructions.  Future Appointments  Date Time Provider [place]  11/23/2022 11:00 AM Kennith Gain, MD [facility] None  12/14/2022  9:00 AM Shanon Ace, LCSW [facility] None    No orders of the defined types were placed in this encounter.     -------------------------------

## 2022-11-23 ENCOUNTER — Ambulatory Visit (INDEPENDENT_AMBULATORY_CARE_PROVIDER_SITE_OTHER): Payer: 59 | Admitting: Allergy

## 2022-11-23 ENCOUNTER — Encounter: Payer: Self-pay | Admitting: Allergy

## 2022-11-23 VITALS — BP 140/92 | HR 86 | Temp 98.0°F | Resp 16

## 2022-11-23 DIAGNOSIS — T7800XD Anaphylactic reaction due to unspecified food, subsequent encounter: Secondary | ICD-10-CM | POA: Diagnosis not present

## 2022-11-23 DIAGNOSIS — J3089 Other allergic rhinitis: Secondary | ICD-10-CM

## 2022-11-23 DIAGNOSIS — H1013 Acute atopic conjunctivitis, bilateral: Secondary | ICD-10-CM | POA: Diagnosis not present

## 2022-11-23 DIAGNOSIS — J4540 Moderate persistent asthma, uncomplicated: Secondary | ICD-10-CM | POA: Diagnosis not present

## 2022-11-23 DIAGNOSIS — L2089 Other atopic dermatitis: Secondary | ICD-10-CM

## 2022-11-23 MED ORDER — FLUTICASONE PROPIONATE 50 MCG/ACT NA SUSP
NASAL | 1 refills | Status: DC
Start: 2022-11-23 — End: 2023-01-31

## 2022-11-23 MED ORDER — AZELASTINE HCL 0.1 % NA SOLN
2.0000 | Freq: Two times a day (BID) | NASAL | 1 refills | Status: DC
Start: 2022-11-23 — End: 2023-01-31

## 2022-11-23 NOTE — Patient Instructions (Addendum)
Asthma Continue Symbicort 160- 2 puffs twice a day to prevent cough or wheeze. Continue montelukast 10mg  once a day to prevent cough or wheeze. Continue Nucala injections once every 4 weeks. Asthma Action Plan (during respiratory illness or asthma flare): add in Flovent 2 puffs twice a day for 1-2 weeks and can stop once illness/symptoms have resolved.  Asthma control goals:  Full participation in all desired activities (may need albuterol before activity) Albuterol use two time or less a week on average (not counting use with activity) Cough interfering with sleep two time or less a month Oral steroids no more than once a year No hospitalizations   Allergic rhinitis/allergic conjunctivitis Continue allergen avoidance measures directed toward pollens, mold, pet, dust mite, cockroach, and mouse urine. Continue Carbinoxamine 4mg  3 times a day as needed for nasal symptoms.  Can take 8mg  (2 tabs) together if needed for increase allergy symptom control.   Continue Flonase 2 sprays in each nostril once a day as needed for stuffy nose. Continue Azelastine 2 sprays in each nostril twice a day as needed for runny nose. Continue montelukast 10 mg once a day as listed above. Continue Bepreve eye drop 1 drop each eye twice a day as needed for itchy/watery eyes. Continue a lubricating eyedrop as needed.  Food allergy Continue to avoid almonds.  In case of an allergic reaction, take Benadryl 50 mg every 4 hours, and if life-threatening symptoms occur, inject with EpiPen 0.3 mg.  Atopic dermatitis Continue a twice a day moisturizing routine with Aveeno. Continue triamcinolone 0.1% ointment up to red and itchy areas below your face up to twice a day as needed.  Reflux Continue dietary and lifestyle modifications. Continue Protonix 40 mg once a day as previously prescribed.  Follow up in the clinic in 6 month or sooner if needed.

## 2022-11-23 NOTE — Progress Notes (Signed)
Follow-up Note  RE: Chelsea Benson MRN: [id number] DOB: [birth year] Date of Office Visit: 11/23/2022   History of present illness: Chelsea Benson is a 71 y.o. female presenting today for follow-up of asthma, allergic rhinitis with conjunctivitis, food allergy, atopic dermatitis and reflux.  Her last visit was a telemedicine visit on 08/09/22 by our nurse practitioner [facility].  She states she realized that her eye symptoms are better controlled with use of the eye drop (bepreve currently) with use of nasal sprays (steroid and antihitsamine).  She has used flonase and asteline.  When use separately (eye drop and nose sprays) she does not get the best benefit.  Thus she has been doing them at same time.  She does feel the carbinoxamine is effective and taking 1 tab 3 times a day but sometimes states she can have more allergy symptoms and it flares up a bit and not sure what to do at those times.   She states she has noted some wheezing but denies any significant cough or shortness of breath symptoms.  She states she did need to use albuterol last month when she was on vacation.  She is taking singulair daily as well as using symbicort 2 puffs twice a day. She has not had any systemic steroids for asthma symptoms since last visit.  She continues on nucala monthly injections and tolerating well administered by her daughter.  She continues to avoid almonds without accidental ingestion or needed for epinephrine use.  She reports no issues at this time with eczema or need for triamcinolone use.   She continues to use protonix for reflux control.    Review of systems: Review of Systems  Constitutional: Negative.   HENT: Negative.    Eyes: Negative.   Respiratory: Negative.    Cardiovascular: Negative.   Gastrointestinal: Negative.   Musculoskeletal: Negative.   Skin: Negative.   Allergic/Immunologic: Negative.   Neurological: Negative.      All other systems negative unless noted above in  HPI  Past medical/social/surgical/family history have been reviewed and are unchanged unless specifically indicated below.  No changes  Medication List: Current Outpatient Medications  Medication Sig Dispense Refill   ACCU-CHEK GUIDE test strip TEST BID PRN  1   albuterol (PROVENTIL) (2.5 MG/3ML) 0.083% nebulizer solution USE 1 VIAL IN NEBULIZER EVERY 4 HOURS - and as needed 75 mL 0   albuterol (VENTOLIN HFA) 108 (90 Base) MCG/ACT inhaler USE 2 INHALATIONS BY MOUTH EVERY 4 HOURS AS NEEDED FOR WHEEZING  OR SHORTNESS OF BREATH 26.8 g 4   ARIPiprazole (ABILIFY) 2 MG tablet Take 2 tablets (4 mg total) by mouth daily. 180 tablet 0   Blood Glucose Monitoring Suppl (ACCU-CHEK GUIDE) w/Device KIT See admin instructions.  1   budesonide-formoterol (SYMBICORT) 160-4.5 MCG/ACT inhaler INHALE 2 INHALATIONS BY MOUTH  INTO THE LUNGS IN THE MORNING  AND AT BEDTIME 10.2 g 11   busPIRone (BUSPAR) 30 MG tablet Take 1 tablet (30 mg total) by mouth 2 (two) times daily. 180 tablet 0   CALCIUM-VITAMIN D PO Take 1 tablet by mouth daily.     Carbinoxamine Maleate 4 MG TABS Take 1 tablet (4 mg total) by mouth 3 (three) times daily as needed. 180 tablet 1   [START ON 12/29/2022] clonazePAM (KLONOPIN) 0.5 MG tablet Take 1/2-1 tab po TID prn anxiety. (Do not combine with Ambien) 270 tablet 1   desonide (DESOWEN) 0.05 % ointment APPLY TO AFFECTED AREA(S)  TOPICALLY TWICE DAILY AS  NEEDED 180 g 1   diltiazem (CARDIZEM CD) 300 MG 24 hr capsule TAKE 1 CAPSULE BY MOUTH DAILY 100 capsule 2   diltiazem (CARDIZEM) 60 MG tablet TAKE DILITAZEM 60 MG BY MOUTH AS NEEDED FOR PALPITATIONS. YOU CAN TAKE EVERY 6 HOURS AS NEEDED UP TO TWICE DAILY WITH EACH EPISODE 60 tablet 7   doxycycline (VIBRAMYCIN) 100 MG capsule Take 100 mg by mouth 2 (two) times daily.     ELIQUIS 5 MG TABS tablet TAKE 1 TABLET BY MOUTH TWICE  DAILY 200 tablet 2   EPINEPHrine 0.3 mg/0.3 mL IJ SOAJ injection INJECT INTRAMUSCULARLY 1  PEN AS NEEDED FOR ALLERGIC   RESPONSE AS DIRECTED BY MD. SEEK MEDICAL HELP AFTER  USE. 2 each 0   famotidine (PEPCID) 20 MG tablet TAKE 1 TABLET BY MOUTH DAILY  AFTER SUPPER 100 tablet 2   fluticasone (FLOVENT HFA) 110 MCG/ACT inhaler Inhale 4 puffs twice a day with a spacer for the next 2 weeks or until cough and wheeze free 1 each 0   hydrochlorothiazide (HYDRODIURIL) 25 MG tablet TAKE 1 TABLET BY MOUTH DAILY 90 tablet 3   lidocaine (LIDODERM) 5 % Place 1 patch onto the skin daily. Remove & Discard patch within 12 hours or as directed by MD 30 patch 0   meloxicam (MOBIC) 7.5 MG tablet Take 1 tablet (7.5 mg total) by mouth daily. 30 tablet 0   Mepolizumab (NUCALA) 100 MG/ML SOAJ Inject 1 mL (100 mg total) into the skin every 28 (twenty-eight) days. 1 mL 11   metFORMIN (GLUCOPHAGE-XR) 500 MG 24 hr tablet Take 500 mg by mouth in the morning and at bedtime.     mirtazapine (REMERON) 30 MG tablet TAKE 1 TABLET BY MOUTH AT  BEDTIME 90 tablet 3   montelukast (SINGULAIR) 10 MG tablet TAKE 1 TABLET BY MOUTH AT  BEDTIME 90 tablet 1   Oxcarbazepine (TRILEPTAL) 300 MG tablet Take 2 tablets (600 mg total) by mouth at bedtime. 180 tablet 1   oxybutynin (DITROPAN-XL) 10 MG 24 hr tablet Take 10 mg by mouth daily.     pantoprazole (PROTONIX) 40 MG tablet TAKE 1 TABLET BY MOUTH  DAILY. TAKE 30 TO 60  MINUTES BEFORE FIRST MEAL  OF THE DAY 90 tablet 3   potassium chloride SA (KLOR-CON M) 20 MEQ tablet TAKE 1 TABLET BY MOUTH DAILY 100 tablet 2   pravastatin (PRAVACHOL) 20 MG tablet Take 1 tablet (20 mg total) by mouth at bedtime. 90 tablet 3   pregabalin (LYRICA) 100 MG capsule Take 100 mg by mouth 3 (three) times daily.     Respiratory Therapy Supplies (NEBULIZER) DEVI Use as directed with nebulizer solution. 1 each 1   Respiratory Therapy Supplies (NEBULIZER/TUBING/MOUTHPIECE) KIT Use as directed with nebulizer machine 1 kit 12   Semaglutide, 1 MG/DOSE, (OZEMPIC, 1 MG/DOSE,) 2 MG/1.5ML SOPN Take 1 mg by mouth once a week.     triamcinolone  (NASACORT) 55 MCG/ACT AERO nasal inhaler Place 2 sprays into the nose daily. 16.9 mL 5   zolpidem (AMBIEN) 10 MG tablet Take 0.5-1 tablets (5-10 mg total) by mouth at bedtime as needed. for sleep 90 tablet 1   azelastine (ASTELIN) 0.1 % nasal spray Place 2 sprays into both nostrils 2 (two) times daily. 90 mL 1   fluticasone (FLONASE) 50 MCG/ACT nasal spray USE 2 SPRAYS IN BOTH NOSTRILS  DAILY 48 g 1   No current facility-administered medications for this visit.     Known medication allergies: Allergies  Allergen Reactions   Ramipril Other (See Comments)   Cymbalta [Duloxetine Hcl] Other (See Comments)    Caused hair loss   Methylprednisolone Acetate Hives and Rash    05/29/20 developed hives "all over my back" after MBB; resolved with Benadryl and "a few days."  Can tolerate Dexamethasone/Decadron. 05/29/20 developed hives "all over my back" after MBB; resolved with Benadryl and "a few days."  Can tolerate Dexamethasone/Decadron.   Rosuvastatin Calcium Other (See Comments) and Cough    Rapid heart rate   Almond Meal Itching and Swelling    Tongue swells and itches   Almond Oil Itching and Swelling    Itching and swelling of tongue    Carvedilol Cough   Fish Allergy Itching    Halibut fish   Losartan Potassium Cough   Morphine And Related Itching     Physical examination: Blood pressure (!) 140/92, pulse 86, temperature 98 F (36.7 C), temperature source Temporal, resp. rate 16, SpO2 99 %.  General: Alert, interactive, in no acute distress. HEENT: PERRLA, TMs pearly gray, turbinates minimally edematous without discharge, post-pharynx non erythematous. Neck: Supple without lymphadenopathy. Lungs: Clear to auscultation without wheezing, rhonchi or rales. {no increased work of breathing. CV: Normal S1, S2 without murmurs. Abdomen: Nondistended, nontender. Skin: Warm and dry, without lesions or rashes. Extremities:  No clubbing, cyanosis or edema. Neuro:   Grossly  intact.  Diagnositics/Labs:  Spirometry: FEV1: 1.1L 63%, FVC: 1.43L 63% predicted.  This is an improved study from previous in 2019.    Assessment and plan: Asthma Continue Symbicort 160- 2 puffs twice a day to prevent cough or wheeze. Continue montelukast 69m once a day to prevent cough or wheeze. Continue Nucala injections once every 4 weeks. Asthma Action Plan (during respiratory illness or asthma flare): add in Flovent 2 puffs twice a day for 1-2 weeks and can stop once illness/symptoms have resolved.  Asthma control goals:  Full participation in all desired activities (may need albuterol before activity) Albuterol use two time or less a week on average (not counting use with activity) Cough interfering with sleep two time or less a month Oral steroids no more than once a year No hospitalizations  Allergic rhinitis/allergic conjunctivitis Continue allergen avoidance measures directed toward pollens, mold, pet, dust mite, cockroach, and mouse urine. Continue Carbinoxamine 420m3 times a day as needed for nasal symptoms.  Can take 71m14m2 tabs) together if needed for increase allergy symptom control.   Continue Flonase 2 sprays in each nostril once a day as needed for stuffy nose. Continue Azelastine 2 sprays in each nostril twice a day as needed for runny nose. Continue montelukast 10 mg once a day as listed above. Continue Bepreve eye drop 1 drop each eye twice a day as needed for itchy/watery eyes. Continue a lubricating eyedrop as needed.  Food allergy Continue to avoid almonds.  In case of an allergic reaction, take Benadryl 50 mg every 4 hours, and if life-threatening symptoms occur, inject with EpiPen 0.3 mg.  Atopic dermatitis Continue a twice a day moisturizing routine with Aveeno. Continue triamcinolone 0.1% ointment up to red and itchy areas below your face up to twice a day as needed.  Reflux Continue dietary and lifestyle modifications. Continue Protonix 40 mg  once a day as previously prescribed.  Follow up in the clinic in 6 month or sooner if needed.  I appreciate the opportunity to take part in Loletha's care. Please do not hesitate to contact me with questions.  Sincerely,  Prudy Feeler, MD Allergy/Immunology [facility]

## 2022-12-06 ENCOUNTER — Telehealth: Payer: Self-pay

## 2022-12-06 NOTE — Telephone Encounter (Signed)
Please Advise as  Patient called stating she is still having a lot of Mucous/Congestion/Cough. Her Mucous is yellow/thick. She was last seen 11/23/2022 with Dr Nelva Bush.  [place] called back and wanted it known her cough is wet.     Routing to [place] as she has seen her before and Dr.Padgett is out.

## 2022-12-06 NOTE — Telephone Encounter (Signed)
I did not see anything mentioned in Dr. Jeralyn Ruths last note about cough. Can you please find out some additional info about symptoms? Any fever? Any sick contacts when did this start. Please have her make sure to use all the nasal sprays and stop taking any antihistamines for the next few days. Please have her come into the clinic for further eval if needed. Thank you

## 2022-12-06 NOTE — Telephone Encounter (Signed)
Chelsea Benson called back and wanted it known her cough is wet.

## 2022-12-06 NOTE — Telephone Encounter (Signed)
Patient called stating she is still having a lot of Mucous/Congestion/Cough. Her Mucous is yellow/thick. She was last seen 11/23/2022 with Dr Nelva Bush.   [place]

## 2022-12-07 NOTE — Telephone Encounter (Signed)
Thank you :)

## 2022-12-08 ENCOUNTER — Telehealth: Payer: Self-pay | Admitting: Cardiology

## 2022-12-08 MED ORDER — DILTIAZEM HCL ER COATED BEADS 300 MG PO CP24: 300.0000 mg | ORAL_CAPSULE | Freq: Every day | ORAL | 2 refills | Status: DC

## 2022-12-08 NOTE — Telephone Encounter (Signed)
Spoke with pt, aware per the pharmacy, they do not have it in stock but can order and have it here tomorrow. Refill sent to the pharmacy electronically.

## 2022-12-08 NOTE — Telephone Encounter (Signed)
Patient stated [facility] does not have diltiazem. Recommended she contact her local pharmacy to find out if they can get the medication. She will contact our clinic to inform us.

## 2022-12-08 NOTE — Telephone Encounter (Signed)
Patient is calling stating that she contacted her local pharmacy and they are not able to fill the medication either. She states they advised her the manufacturer is on back order. Please advise.

## 2022-12-08 NOTE — Telephone Encounter (Signed)
Pt c/o medication issue:  1. Name of Medication: Diltiazem 300 mg  2. How are you currently taking this medication (dosage and times per day)?  1 daily  3. Are you having a reaction (difficulty breathing--STAT)?   4. What is your medication issue?  the last 2 months, pharmacy have not been able to get Diltiazem from the manufacturer- she needs something else to take

## 2022-12-12 ENCOUNTER — Telehealth: Payer: Self-pay | Admitting: Family Medicine

## 2022-12-12 DIAGNOSIS — J4540 Moderate persistent asthma, uncomplicated: Secondary | ICD-10-CM

## 2022-12-12 MED ORDER — ALBUTEROL SULFATE (2.5 MG/3ML) 0.083% IN NEBU: INHALATION_SOLUTION | RESPIRATORY_TRACT | 3 refills | Status: DC

## 2022-12-12 MED ORDER — FEXOFENADINE HCL 180 MG PO TABS: 180.0000 mg | ORAL_TABLET | Freq: Every day | ORAL | 0 refills | Status: DC

## 2022-12-12 NOTE — Telephone Encounter (Signed)
Patient called back into the office stating that she needed refills on her nebulizer solution. Also advised patient of mychart message that was sent about her antihistamines.

## 2022-12-12 NOTE — Telephone Encounter (Signed)
[place] called in and states she is taking Carbinoamine Maleate and oxybutynin and was told by her pharmacist she can't take these two together.  [place] states she needs a new antihistamine.  Please advise.

## 2022-12-12 NOTE — Telephone Encounter (Signed)
Mychart message sent with instructions

## 2022-12-12 NOTE — Telephone Encounter (Signed)
OK to change to Allegra and even better to change to azelastine nasal spray 2 sprays in each nostril up to twice a day in each nostril. Thank you

## 2022-12-14 ENCOUNTER — Ambulatory Visit (INDEPENDENT_AMBULATORY_CARE_PROVIDER_SITE_OTHER): Payer: 59 | Admitting: Psychiatry

## 2022-12-14 DIAGNOSIS — F419 Anxiety disorder, unspecified: Secondary | ICD-10-CM | POA: Diagnosis not present
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
## 2022-12-26 ENCOUNTER — Encounter: Payer: Self-pay | Admitting: Psychiatry

## 2022-12-26 ENCOUNTER — Telehealth (INDEPENDENT_AMBULATORY_CARE_PROVIDER_SITE_OTHER): Payer: 59 | Admitting: Psychiatry

## 2022-12-26 DIAGNOSIS — F3181 Bipolar II disorder: Secondary | ICD-10-CM

## 2022-12-26 DIAGNOSIS — F419 Anxiety disorder, unspecified: Secondary | ICD-10-CM

## 2022-12-26 DIAGNOSIS — F5101 Primary insomnia: Secondary | ICD-10-CM

## 2022-12-26 MED ORDER — ARIPIPRAZOLE 2 MG PO TABS: 4.0000 mg | ORAL_TABLET | Freq: Every day | ORAL | 0 refills | Status: DC

## 2022-12-26 MED ORDER — BUSPIRONE HCL 30 MG PO TABS: 30.0000 mg | ORAL_TABLET | Freq: Two times a day (BID) | ORAL | 0 refills | Status: DC

## 2022-12-26 MED ORDER — ZOLPIDEM TARTRATE 10 MG PO TABS: 5.0000 mg | ORAL_TABLET | Freq: Every evening | ORAL | 1 refills | Status: DC | PRN

## 2022-12-26 NOTE — Progress Notes (Signed)
Chelsea Benson [id number] 1951/02/01 72 y.o.  Virtual Visit via Video Note  I connected with pt @ on 12/26/22 at 10:00 AM EST by a video enabled telemedicine application and verified that I am speaking with the correct person using two identifiers.   I discussed the limitations of evaluation and management by telemedicine and the availability of in person appointments. The patient expressed understanding and agreed to proceed.  I discussed the assessment and treatment plan with the patient. The patient was provided an opportunity to ask questions and all were answered. The patient agreed with the plan and demonstrated an understanding of the instructions.   The patient was advised to call back or seek an in-person evaluation if the symptoms worsen or if the condition fails to improve as anticipated.  I provided 26 minutes of non-face-to-face time during this encounter.  The patient was located at home.  The provider was located at [facility].   Corie Chiquito, PMHNP   Subjective:   Patient ID:  Chelsea Benson is a 72 y.o. (DOB 1950-12-24) female.  Chief Complaint:  Chief Complaint  Patient presents with   Follow-up    Anxiety, insomnia, depression    HPI Chelsea Benson presents for follow-up of mood disturbance, anxiety, and insomnia.  She reports that she had a good Christmas and was able to see her grandchildren. She reports that situation has not improved with her daughter.She reports that she has "been learning to accept the situation" with her daughter. She would like to talk with her daughter. She reports that she has been receiving support from family. She reports that something happened over the weekend that caused conflict in a relationship and this has been resolved. Denies depressed mood. She reports that she has some situational anxiety in response to relationship stressor. She was not able to sleep the night after relationship stressor and some trouble  last night. She reports that otherwise her sleep has been ok. Denies any recent panic attacks. Denies irritability. Energy and motivation have been good. She plans to return to school in August. Has 2.5 semesters remaining. Concentration has been good. Denies SI.   She is in a relationship with someone in [place]. She will go visit him for long periods of time and that it causes some difficulty re-adjusting when she returns to [facility]. His family has discouraged him from returning to [place] to be with her. She reports that she wishes he would move to [place].   Ambien #90 last filled 11/11/22 Klonopin last filled 10/06/22  Past Psychiatric Medication Trials: Cymbalta-hair loss Mirtazapine Prozac Trazodone-ineffective Ambien Belsomra BuSpar Hydroxyzine Abilify Trileptal Xanax  Review of Systems:  Review of Systems  Musculoskeletal:  Negative for gait problem.  Allergic/Immunologic:       Improved allergy symptoms  Psychiatric/Behavioral:         Please refer to HPI    Medications: I have reviewed the patient's current medications.  Current Outpatient Medications  Medication Sig Dispense Refill   fexofenadine (ALLEGRA) 180 MG tablet Take 1 tablet (180 mg total) by mouth daily. 30 tablet 0   ACCU-CHEK GUIDE test strip TEST BID PRN  1   albuterol (PROVENTIL) (2.5 MG/3ML) 0.083% nebulizer solution USE 1 VIAL IN NEBULIZER EVERY 4 HOURS - and as needed 75 mL 3   albuterol (VENTOLIN HFA) 108 (90 Base) MCG/ACT inhaler USE 2 INHALATIONS BY MOUTH EVERY 4 HOURS AS NEEDED FOR WHEEZING  OR SHORTNESS OF BREATH 26.8 g 4   ARIPiprazole (ABILIFY)  2 MG tablet Take 2 tablets (4 mg total) by mouth daily. 180 tablet 0   azelastine (ASTELIN) 0.1 % nasal spray Place 2 sprays into both nostrils 2 (two) times daily. 90 mL 1   Blood Glucose Monitoring Suppl (ACCU-CHEK GUIDE) w/Device KIT See admin instructions.  1   budesonide-formoterol (SYMBICORT) 160-4.5 MCG/ACT inhaler INHALE 2 INHALATIONS BY MOUTH  INTO THE LUNGS IN  THE MORNING  AND AT BEDTIME 10.2 g 11   busPIRone (BUSPAR) 30 MG tablet Take 1 tablet (30 mg total) by mouth 2 (two) times daily. 180 tablet 0   CALCIUM-VITAMIN D PO Take 1 tablet by mouth daily.     Carbinoxamine Maleate 4 MG TABS Take 1 tablet (4 mg total) by mouth 3 (three) times daily as needed. 180 tablet 1   [START ON 12/29/2022] clonazePAM (KLONOPIN) 0.5 MG tablet Take 1/2-1 tab po TID prn anxiety. (Do not combine with Ambien) 270 tablet 1   desonide (DESOWEN) 0.05 % ointment APPLY TO AFFECTED AREA(S)  TOPICALLY TWICE DAILY AS  NEEDED 180 g 1   diltiazem (CARDIZEM CD) 300 MG 24 hr capsule Take 1 capsule (300 mg total) by mouth daily. 100 capsule 2   diltiazem (CARDIZEM) 60 MG tablet TAKE DILITAZEM 60 MG BY MOUTH AS NEEDED FOR PALPITATIONS. YOU CAN TAKE EVERY 6 HOURS AS NEEDED UP TO TWICE DAILY WITH EACH EPISODE 60 tablet 7   doxycycline (VIBRAMYCIN) 100 MG capsule Take 100 mg by mouth 2 (two) times daily.     ELIQUIS 5 MG TABS tablet TAKE 1 TABLET BY MOUTH TWICE  DAILY 200 tablet 2   EPINEPHrine 0.3 mg/0.3 mL IJ SOAJ injection INJECT INTRAMUSCULARLY 1  PEN AS NEEDED FOR ALLERGIC  RESPONSE AS DIRECTED BY MD. SEEK MEDICAL HELP AFTER  USE. 2 each 0   famotidine (PEPCID) 20 MG tablet TAKE 1 TABLET BY MOUTH DAILY  AFTER SUPPER 100 tablet 2   fluticasone (FLONASE) 50 MCG/ACT nasal spray USE 2 SPRAYS IN BOTH NOSTRILS  DAILY 48 g 1   fluticasone (FLOVENT HFA) 110 MCG/ACT inhaler Inhale 4 puffs twice a day with a spacer for the next 2 weeks or until cough and wheeze free 1 each 0   hydrochlorothiazide (HYDRODIURIL) 25 MG tablet TAKE 1 TABLET BY MOUTH DAILY 90 tablet 3   lidocaine (LIDODERM) 5 % Place 1 patch onto the skin daily. Remove & Discard patch within 12 hours or as directed by MD 30 patch 0   meloxicam (MOBIC) 7.5 MG tablet Take 1 tablet (7.5 mg total) by mouth daily. 30 tablet 0   Mepolizumab (NUCALA) 100 MG/ML SOAJ Inject 1 mL (100 mg total) into the skin every 28 (twenty-eight) days. 1 mL 11    metFORMIN (GLUCOPHAGE-XR) 500 MG 24 hr tablet Take 500 mg by mouth in the morning and at bedtime.     mirtazapine (REMERON) 30 MG tablet TAKE 1 TABLET BY MOUTH AT  BEDTIME 90 tablet 3   montelukast (SINGULAIR) 10 MG tablet TAKE 1 TABLET BY MOUTH AT  BEDTIME 90 tablet 1   Oxcarbazepine (TRILEPTAL) 300 MG tablet Take 2 tablets (600 mg total) by mouth at bedtime. 180 tablet 1   oxybutynin (DITROPAN-XL) 10 MG 24 hr tablet Take 10 mg by mouth daily.     pantoprazole (PROTONIX) 40 MG tablet TAKE 1 TABLET BY MOUTH  DAILY. TAKE 30 TO 60  MINUTES BEFORE FIRST MEAL  OF THE DAY 90 tablet 3   potassium chloride SA (KLOR-CON M) 20 MEQ tablet  TAKE 1 TABLET BY MOUTH DAILY 100 tablet 2   pravastatin (PRAVACHOL) 20 MG tablet Take 1 tablet (20 mg total) by mouth at bedtime. 90 tablet 3   pregabalin (LYRICA) 100 MG capsule Take 100 mg by mouth 3 (three) times daily.     Respiratory Therapy Supplies (NEBULIZER) DEVI Use as directed with nebulizer solution. 1 each 1   Respiratory Therapy Supplies (NEBULIZER/TUBING/MOUTHPIECE) KIT Use as directed with nebulizer machine 1 kit 12   Semaglutide, 1 MG/DOSE, (OZEMPIC, 1 MG/DOSE,) 2 MG/1.5ML SOPN Take 1 mg by mouth once a week.     triamcinolone (NASACORT) 55 MCG/ACT AERO nasal inhaler Place 2 sprays into the nose daily. 16.9 mL 5   [START ON 02/03/2023] zolpidem (AMBIEN) 10 MG tablet Take 0.5-1 tablets (5-10 mg total) by mouth at bedtime as needed. for sleep 90 tablet 1   No current facility-administered medications for this visit.    Medication Side Effects: None  Allergies:  Allergies  Allergen Reactions   Ramipril Other (See Comments)   Cymbalta [Duloxetine Hcl] Other (See Comments)    Caused hair loss   Methylprednisolone Acetate Hives and Rash    05/29/20 developed hives "all over my back" after MBB; resolved with Benadryl and "a few days."  Can tolerate Dexamethasone/Decadron. 05/29/20 developed hives "all over my back" after MBB; resolved with Benadryl and "a  few days."  Can tolerate Dexamethasone/Decadron.   Rosuvastatin Calcium Other (See Comments) and Cough    Rapid heart rate   Almond Meal Itching and Swelling    Tongue swells and itches   Almond Oil Itching and Swelling    Itching and swelling of tongue    Carvedilol Cough   Fish Allergy Itching    Halibut fish   Losartan Potassium Cough   Morphine And Related Itching    Past Medical History:  Diagnosis Date   Allergic rhinoconjunctivitis    Anginal pain (HCC)    Anxiety    Arthritis    Spondylolysis, knee and ankle arthritis pains.   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  He uses a walker   Depression    Diabetes mellitus without complication (HCC)    dx in 2007   Diabetes mellitus, type II (HCC)    Dysrhythmia    Eczema    Headache(784.0)    sinus related    History of kidney stones    History of nephrolithiasis    Hx of pulmonary embolus 2017   Hyperlipidemia    Hypertension    Insomnia    Memory change    OSA on CPAP    PSG 05/24/2016: AHI 17/hour 02 mean 76%.  HSAT 06/13/17, AHI 11-hour 02 mean 79%   Paroxysmal atrial fibrillation (HCC) 02/24/2021   CHA2DS2-VASc score 4 (HTN, DM, female, age-66); on Eliquis   Pneumonia    Tremor of both hands     Family History  Problem Relation Age of Onset   Diabetes Sister    Diabetes Brother    Hypertension Brother    Cancer Maternal Grandmother    Heart failure Maternal Grandmother    Hypertension Paternal Grandmother    Asthma Daughter    Cancer Daughter  Depression Daughter    Hypertension Daughter    Heart failure Maternal Aunt    Pneumonia Mother    Diabetes Mother    Alcoholism Father    Alcohol abuse Father    Depression Daughter     Schizophrenia Grandchild    Allergies Neg Hx    Eczema Neg Hx    Immunodeficiency Neg Hx     Social History   Socioeconomic History   Marital status: Single    Spouse name: Not on file   Number of children: 2   Years of education: Not on file   Highest education level: Some college, no degree  Occupational History    Comment: NA  Tobacco Use   Smoking status: Never    Passive exposure: Yes   Smokeless tobacco: Never   Tobacco comments:    grandson smokes, but in his car  Vaping Use   Vaping Use: Never used  Substance and Sexual Activity   Alcohol use: Yes    Comment: rare use   Drug use: No   Sexual activity: Not Currently  Other Topics Concern   Not on file  Social History Narrative   She is currently single.  Has 2 girls.  Has some college education.  Currently disabled due to chronic back pain and not employed.   07/01/19 lives with brother Shanon Brow   Caffeine, none   Social Determinants of Health   Financial Resource Strain: Not on file  Food Insecurity: Not on file  Transportation Needs: Not on file  Physical Activity: Not on file  Stress: Not on file  Social Connections: Not on file  Intimate Partner Violence: Not on file    Past Medical History, Surgical history, Social history, and Family history were reviewed and updated as appropriate.   Please see review of systems for further details on the patient's review from today.   Objective:   Physical Exam:  There were no vitals taken for this visit.  Physical Exam Neurological:     Mental Status: She is alert and oriented to person, place, and time.     Cranial Nerves: No dysarthria.  Psychiatric:        Attention and Perception: Attention and perception normal.        Mood and Affect: Mood normal.        Speech: Speech normal.        Behavior: Behavior is cooperative.        Thought Content: Thought content normal. Thought content is not paranoid or delusional. Thought content does not include  homicidal or suicidal ideation. Thought content does not include homicidal or suicidal plan.        Cognition and Memory: Cognition and memory normal.        Judgment: Judgment normal.     Comments: Insight intact     Lab Review:     Component Value Date/Time   NA 141 10/07/2022 1219   K 3.8 10/07/2022 1219   CL 106 10/07/2022 1219   CO2 21 10/07/2022 1219   GLUCOSE 148 (H) 10/07/2022 1219   GLUCOSE 149 (H) 12/23/2021 1116   BUN 14 10/07/2022 1219   CREATININE 0.64 10/07/2022 1219   CALCIUM 9.8 10/07/2022 1219   PROT 7.0 10/07/2022 1219   ALBUMIN 4.7 10/07/2022 1219   AST 18 10/07/2022 1219   ALT 15 10/07/2022 1219   ALKPHOS 56 10/07/2022 1219   BILITOT <0.2 10/07/2022 1219   GFRNONAA >60 12/23/2021 1116   GFRAA 108 07/06/2020 0834  Component Value Date/Time   WBC 4.2 12/23/2021 1116   RBC 4.74 12/23/2021 1116   HGB 12.5 12/23/2021 1116   HGB 12.5 11/08/2018 1041   HCT 40.8 12/23/2021 1116   HCT 39.5 11/08/2018 1041   PLT 297 12/23/2021 1116   MCV 86.1 12/23/2021 1116   MCV 81 11/08/2018 1041   MCH 26.4 12/23/2021 1116   MCHC 30.6 12/23/2021 1116   RDW 15.7 (H) 12/23/2021 1116   RDW 14.3 11/08/2018 1041   LYMPHSABS 2.1 04/28/2019 1712   LYMPHSABS 1.4 11/08/2018 1041   MONOABS 0.5 04/28/2019 1712   EOSABS 0.3 04/28/2019 1712   EOSABS 0.2 11/08/2018 1041   BASOSABS 0.0 04/28/2019 1712   BASOSABS 0.0 11/08/2018 1041    No results found for: "POCLITH", "LITHIUM"   No results found for: "PHENYTOIN", "PHENOBARB", "VALPROATE", "CBMZ"   .res Assessment: Plan:   Pt seen for 25 minutes and time spent discussing recent anxiety and insomnia in response to stressors. She reports that overall her sleep has improved, aside from some recent worsening after relationship stressor.  Continue Abilify 4 mg po qd for mood s/s.  Continue Buspar 30 mg po BID for anxiety. Continue Klonopin 0.5 mg 1/2-1 tab prn panic. Continue Dayvigo 10 mg at bedtime for insomnia.   Continue Remeron 30 mg po qhs for depression and insomnia.  Continue Trileptal 600 mg po QHS for mood stabilization and anxiety.  Continue Ambien 10 mg 1/2-1 tab po QHS prn insomnia.  Recommend continuing therapy with Rinaldo Cloud, LCSW.  Pt to follow-up with this provider in 3 months or sooner if clinically indicated.  Patient advised to contact office with any questions, adverse effects, or acute worsening in signs and symptoms.   Chelsea Benson was seen today for follow-up.  Diagnoses and all orders for this visit:  Primary insomnia -     zolpidem (AMBIEN) 10 MG tablet; Take 0.5-1 tablets (5-10 mg total) by mouth at bedtime as needed. for sleep  Bipolar II disorder (HCC) -     ARIPiprazole (ABILIFY) 2 MG tablet; Take 2 tablets (4 mg total) by mouth daily.  Anxiety disorder, unspecified type -     busPIRone (BUSPAR) 30 MG tablet; Take 1 tablet (30 mg total) by mouth 2 (two) times daily.     Please see After Visit Summary for patient specific instructions.  Future Appointments  Date Time Provider [place]  01/11/2023 10:00 AM Shanon Ace, LCSW [facility] None  01/24/2023 10:30 AM Tanda Rockers, MD [facility] None  05/25/2023 11:00 AM Kennith Gain, MD [facility] None    No orders of the defined types were placed in this encounter.     -------------------------------

## 2022-12-28 ENCOUNTER — Telehealth: Payer: Self-pay | Admitting: Cardiology

## 2022-12-28 MED ORDER — DILTIAZEM HCL ER COATED BEADS 300 MG PO CP24: 300.0000 mg | ORAL_CAPSULE | Freq: Every day | ORAL | 2 refills | Status: DC

## 2022-12-28 NOTE — Telephone Encounter (Signed)
*  STAT* If patient is at the pharmacy, call can be transferred to refill team.   1. Which medications need to be refilled? (please list name of each medication and dose if known)  diltiazem (CARDIZEM) 60 MG tablet  2. Which pharmacy/location (including street and city if local pharmacy) is medication to be sent to? [facility] [phone] - [place], [place] - [address] AT [place]  3. Do they need a 30 day or 90 day supply?  90 day supply

## 2023-01-01 ENCOUNTER — Other Ambulatory Visit: Payer: Self-pay | Admitting: Allergy

## 2023-01-06 DIAGNOSIS — G4733 Obstructive sleep apnea (adult) (pediatric): Secondary | ICD-10-CM | POA: Diagnosis not present

## 2023-01-09 DIAGNOSIS — M5440 Lumbago with sciatica, unspecified side: Secondary | ICD-10-CM | POA: Diagnosis not present

## 2023-01-10 ENCOUNTER — Telehealth: Payer: Self-pay | Admitting: Allergy

## 2023-01-10 DIAGNOSIS — I10 Essential (primary) hypertension: Secondary | ICD-10-CM | POA: Diagnosis not present

## 2023-01-10 DIAGNOSIS — Z9181 History of falling: Secondary | ICD-10-CM | POA: Diagnosis not present

## 2023-01-10 DIAGNOSIS — M545 Low back pain, unspecified: Secondary | ICD-10-CM | POA: Diagnosis not present

## 2023-01-10 MED ORDER — BEPOTASTINE BESILATE 1.5 % OP SOLN: 1.0000 [drp] | Freq: Two times a day (BID) | OPHTHALMIC | 1 refills | Status: DC

## 2023-01-10 NOTE — Telephone Encounter (Signed)
Refill sent to [facility] that is on file.

## 2023-01-10 NOTE — Telephone Encounter (Signed)
Patient called and needs to have Bepreve eye drops called into mail order pharmacy [phone]

## 2023-01-11 ENCOUNTER — Other Ambulatory Visit: Payer: Self-pay | Admitting: Family Medicine

## 2023-01-11 ENCOUNTER — Ambulatory Visit (INDEPENDENT_AMBULATORY_CARE_PROVIDER_SITE_OTHER): Payer: 59 | Admitting: Psychiatry

## 2023-01-11 DIAGNOSIS — F419 Anxiety disorder, unspecified: Secondary | ICD-10-CM

## 2023-01-11 NOTE — Progress Notes (Signed)
[facility] Counselor/Therapist Progress Note  Patient ID: Chelsea Benson, MRN: [id number],    Date: 01/11/2023  Time Spent: 45 minutes  Treatment Type: Individual Therapy  Virtual Visit via Telehealth Note; MyChart Video Connected with patient by a telemedicine/telehealth application, with their informed consent, and verified patient privacy and that I am speaking with the correct person using two identifiers. I discussed the limitations, risks, security and privacy concerns of performing psychotherapy and the availability of in person appointments. I also discussed with the patient that there may be a patient responsible charge related to this service. The patient expressed understanding and agreed to proceed. I discussed the treatment planning with the patient. The patient was provided an opportunity to ask questions and all were answered. The patient agreed with the plan and demonstrated an understanding of the instructions. The patient was advised to call  our office if  symptoms worsen or feel they are in a crisis state and need immediate contact.   Therapist Location: office Patient Location: home   Reported Symptoms:  anxiety, "frustration and upset"  Mental Status Exam:  Appearance:   Casual     Behavior:  Appropriate, Sharing, and Motivated  Motor:  Uses cane or rollater  Speech/Language:   Clear and Coherent  Affect:  Anxious  Mood:  anxious  Thought process:  goal directed  Thought content:    Rumination and some obsessive thoughts about physical issues  Sensory/Perceptual disturbances:    WNL  Orientation:  oriented to person, place, time/date, situation, day of week, month of year, year, and stated date of Feb. 7, 2024  Attention:  Good  Concentration:  Good  Memory:  [place] of knowledge:   Good  Insight:    Good and Fair  Judgment:   Good  Impulse Control:  Good   Risk Assessment: Danger to Self:  No Self-injurious Behavior: No Danger to  Others: No Duty to Warn:no Physical Aggression / Violence:No  Access to Firearms a concern: No  Gang Involvement:No   Subjective:   Patient today reporting anxiety, frustration, and "upset". "Upset" with a previous Dr over the way she feels her situation was not handled in helpful manner. Now has new Dr and feels better and is involved in PT, and is to follow up with her neurosurgeon to see if current left side pain might be related to prior surgery. Grandson moving out from patient's home to his own apt in [place] next week. Shares that daughter did speak to her recently when patient attended an event for her granddaughter.  Home/family situations that have been more stressful and difficult and communication recently, seems to be changing some in a more positive direction which is encouraging for patient.  Patient had previously been having a lot of stress and worrying over some family dynamics but hoping that things are changing some gradually.  Has decided that she is not planning to move to [place] at this point, per her discussion at last appointment.  Processed some physical and emotional health issues within the family and how this impacts patient, and what she can do to better manage her concerns "other than worrying and being upset".  Also discussed trying to have some better limits and not "always allowing or accepting any behavior".  Continue to encourage patient and having healthy boundaries both within the family and beyond as well as positive self-care and making good choices for her own health and wellbeing.  Interventions: Cognitive Behavioral Therapy  and Ego-Supportive  Long term goal: (measurable) Reduce overall level, frequency, and intensity of the anxiety so that daily functioning is not impaired.  Patient will eventually report a rating of "4" or less" on 1-10 anxiety scale for a 36-month time period where her daily functioning is not impaired.  Short term goal: Increase  understanding of beliefs and messages that produce the worry and anxiety. Strategy: Patient will explore and work to stop cognitive messages that feed anxiety and work to replace them with more positive and empowering messages   Diagnosis:   ICD-10-CM   1. Anxiety disorder, unspecified type  F41.9      Plan:  Patient today showing active participation and motivation in session as she worked on her anxiety and frustrations that were leading her to be more upset.  Has had some frustrations in different areas of life including health concerns, family concerns, and her future as she sees it.  Is becoming some better at "letting some things go" and especially noticing the things that she cannot control or change, and focusing more on "what I can change".  Has made progress and needs to continue working with goal-directed behaviors to keep moving in a forward direction.  Denies any depression at this point.  Making her own self-care more of a priority. Encouraged patient and practicing more positive and self affirming behaviors as discussed in session including: Staying in contact with supportive people, continue to lessen her exposure to disturbing news on TV or online as this feeds her anxiety, remain on her prescribed medications, trusting herself more and recognizing her strengths versus just her limitations, setting limits with others who are unhealthy for her, get outside on her porch when possible as that is a place she really enjoys being, healthy nutrition, remain in the present focusing on what she can change or control rather than getting stuck in the past or jumping too far ahead, encouraging self talk, and realize the strength she shows working with goal-directed behaviors to move in a direction that supports her improved emotional health and overall wellbeing.  Goal review and progress/challenges noted with patient.  Next appointment within 3 to 4 weeks.  This record has been created using  [employer].  Chart creation errors have been sought, but may not always have been located and corrected.  Such creation errors do not reflect on the standard of medical care provided.   Shanon Ace, LCSW

## 2023-01-13 DIAGNOSIS — M545 Low back pain, unspecified: Secondary | ICD-10-CM | POA: Diagnosis not present

## 2023-01-22 ENCOUNTER — Other Ambulatory Visit: Payer: Self-pay | Admitting: Cardiology

## 2023-01-23 ENCOUNTER — Telehealth: Payer: Self-pay

## 2023-01-23 NOTE — Progress Notes (Unsigned)
Chelsea Benson, female    DOB: [birth year]     MRN: [id number]   Brief patient profile:  39  yobf  never smoker asthma from childhood on shots in 50's> no better  and started nucala June 5th 2020 per Chelsea Benson with chronic cough Chelsea Benson always some better p prednisone referred to pulmonary clinic 06/05/2019 by Chelsea Benson   Chelsea Benson for refractory cough.     History of Present Illness  06/05/2019  Pulmonary/ 1st office eval/Chelsea Benson  Chief Complaint  Patient presents with   Pulmonary Consult    Referred by Chelsea Benson for eval of Asthma.   Dyspnea:  Limited by back more than breathing  Cough: 24/7 much more day than night and dry/hacking  Sleep: on side prop up on pillows x 2  SABA use: rarely / nebulizer helps more rec Stop spiriva and powdered albuterol (respiclick)  Plan A = Automatic =  symbicort 160 Take 2 puffs first thing in am and then another 2 puffs about 12 hours later.  Work on inhaler technique:   Plan B = Backup Only use your albuterol nebulizer  as a rescue medication  Pantoprazole (protonix) 40 mg   Take  30-60 min before first meal of the day and Pepcid (famotidine)  20 mg one after supper until return to office  GERD diet  Please schedule a follow up office visit in 4 weeks, sooner if needed  with all medications /inhalers/ solutions in hand so we can verify exactly what you are taking. This includes all medications from all doctors and over the counters     07/08/2019  f/u ov/Chelsea Benson re: uacs vs cough variant maint on symb 160 2bid  Chief Complaint  Patient presents with   Follow-up    Breathing is overall doing well. She is having some wheezing. She is using her neb 3 x per day.   Dyspnea:  Better p stopped powdered inhalers  Cough: better but around lunch feels need for  neb > knocks it out Sleeping: snores > sleep eval 8/4 scheduled SABA use: way too much  02: none  Rec Work on inhaler technique:   01/24/2023 Re-establish ov/Chelsea Benson re: asthma since childhood  maint on  symbicort 160 and dupixent  prn flovent   Chief Complaint  Patient presents with   Consult    Wheezing this am.  Needs med refills.  Dyspnea: walks slowly  rollator ok/ [employer] pushing cart = M[employer]3 = can't walk 100 yards even at a slow pace at a flat grade s stopping due to sob   Cough: none  Sleeping: bed is flat/ one pillow  SABA use: not much  hfa/  02: none  Covid status:   vax x 4 , never infected   Worse since 01/22/23 plugged in gain air freshner  No obvious day to day or daytime variability or assoc excess/ purulent sputum or mucus plugs or hemoptysis or cp or chest tightness,  overt sinus or hb symptoms.   Sleeping  without nocturnal  or early am exacerbation  of respiratory  c/o's or need for noct saba. Also denies any obvious fluctuation of symptoms with weather or environmental changes or other aggravating or alleviating factors except as outlined above   No unusual exposure hx or h/o childhood pna/   or knowledge of premature birth.  Current Allergies, Complete Past Medical History, Past Surgical History, Family History, and Social History were reviewed in [employer] record.  ROS  The following are  not active complaints unless bolded Hoarseness, sore throat, dysphagia, dental problems, itching, sneezing,  nasal congestion or discharge of excess mucus or purulent secretions, ear ache,   fever, chills, sweats, unintended wt loss or wt gain, classically pleuritic or exertional cp,  orthopnea pnd or arm/hand swelling  or leg swelling, presyncope, palpitations, abdominal pain, anorexia, nausea, vomiting, diarrhea  or change in bowel habits or change in bladder habits, change in stools or change in urine, dysuria, hematuria,  rash, arthralgias, visual complaints/eyes watery, headache, numbness, weakness or ataxia or problems with walking or coordination,  change in mood or  memory.        Current Meds  Medication Sig   ACCU-CHEK GUIDE test strip TEST BID  PRN   albuterol (PROVENTIL) (2.5 MG/3ML) 0.083% nebulizer solution USE 1 VIAL IN NEBULIZER EVERY 4 HOURS - and as needed   albuterol (VENTOLIN HFA) 108 (90 Base) MCG/ACT inhaler USE 2 INHALATIONS BY MOUTH EVERY 4 HOURS AS NEEDED FOR WHEEZING  OR SHORTNESS OF BREATH   ARIPiprazole (ABILIFY) 2 MG tablet Take 2 tablets (4 mg total) by mouth daily.   azelastine (ASTELIN) 0.1 % nasal spray Place 2 sprays into both nostrils 2 (two) times daily.   Bepotastine Besilate 1.5 % SOLN Place 1 drop into both eyes 2 (two) times daily.   Blood Glucose Monitoring Suppl (ACCU-CHEK GUIDE) w/Device KIT See admin instructions.   budesonide-formoterol (SYMBICORT) 160-4.5 MCG/ACT inhaler INHALE 2 INHALATIONS BY MOUTH  INTO THE LUNGS IN THE MORNING  AND AT BEDTIME   busPIRone (BUSPAR) 30 MG tablet Take 1 tablet (30 mg total) by mouth 2 (two) times daily.   CALCIUM-VITAMIN D PO Take 1 tablet by mouth daily.   Carbinoxamine Maleate 4 MG TABS Take 1 tablet (4 mg total) by mouth 3 (three) times daily as needed.   clonazePAM (KLONOPIN) 0.5 MG tablet Take 1/2-1 tab po TID prn anxiety. (Do not combine with Ambien)   desonide (DESOWEN) 0.05 % ointment APPLY TO AFFECTED AREA(S)  TOPICALLY TWICE DAILY AS  NEEDED   diltiazem (CARDIZEM CD) 300 MG 24 hr capsule Take 1 capsule (300 mg total) by mouth daily.   diltiazem (CARDIZEM) 60 MG tablet TAKE DILITAZEM 60 MG BY MOUTH AS NEEDED FOR PALPITATIONS. YOU CAN TAKE EVERY 6 HOURS AS NEEDED UP TO TWICE DAILY WITH EACH EPISODE   ELIQUIS 5 MG TABS tablet TAKE 1 TABLET BY MOUTH TWICE  DAILY   EPINEPHrine 0.3 mg/0.3 mL IJ SOAJ injection INJECT INTRAMUSCULARLY 1  PEN AS NEEDED FOR ALLERGIC  RESPONSE AS DIRECTED BY MD. SEEK MEDICAL HELP AFTER  USE.   famotidine (PEPCID) 20 MG tablet TAKE 1 TABLET BY MOUTH DAILY  AFTER SUPPER   fexofenadine (ALLERGY RELIEF) 180 MG tablet TAKE 1 TABLET(180 MG) BY MOUTH DAILY   fluticasone (FLONASE) 50 MCG/ACT nasal spray USE 2 SPRAYS IN BOTH NOSTRILS  DAILY    fluticasone (FLOVENT HFA) 110 MCG/ACT inhaler Inhale 4 puffs twice a day with a spacer for the next 2 weeks or until cough and wheeze free   hydrochlorothiazide (HYDRODIURIL) 25 MG tablet TAKE 1 TABLET BY MOUTH DAILY   lidocaine (LIDODERM) 5 % Place 1 patch onto the skin daily. Remove & Discard patch within 12 hours or as directed by MD   meloxicam (MOBIC) 7.5 MG tablet Take 1 tablet (7.5 mg total) by mouth daily.   Mepolizumab (NUCALA) 100 MG/ML SOAJ Inject 1 mL (100 mg total) into the skin every 28 (twenty-eight) days.   metFORMIN (GLUCOPHAGE-XR) 500  MG 24 hr tablet Take 500 mg by mouth in the morning and at bedtime.   mirtazapine (REMERON) 30 MG tablet TAKE 1 TABLET BY MOUTH AT  BEDTIME   montelukast (SINGULAIR) 10 MG tablet TAKE 1 TABLET BY MOUTH AT  BEDTIME   Oxcarbazepine (TRILEPTAL) 300 MG tablet Take 2 tablets (600 mg total) by mouth at bedtime.   oxybutynin (DITROPAN-XL) 10 MG 24 hr tablet Take 10 mg by mouth daily.   pantoprazole (PROTONIX) 40 MG tablet TAKE 1 TABLET BY MOUTH  DAILY. TAKE 30 TO 60  MINUTES BEFORE FIRST MEAL  OF THE DAY   potassium chloride SA (KLOR-CON M) 20 MEQ tablet TAKE 1 TABLET BY MOUTH DAILY   pravastatin (PRAVACHOL) 20 MG tablet Take 1 tablet (20 mg total) by mouth at bedtime.   pregabalin (LYRICA) 100 MG capsule Take 100 mg by mouth 3 (three) times daily.   Respiratory Therapy Supplies (NEBULIZER) DEVI Use as directed with nebulizer solution.   Respiratory Therapy Supplies (NEBULIZER/TUBING/MOUTHPIECE) KIT Use as directed with nebulizer machine   Semaglutide, 1 MG/DOSE, (OZEMPIC, 1 MG/DOSE,) 2 MG/1.5ML SOPN Take 1 mg by mouth once a week.   [START ON 02/03/2023] zolpidem (AMBIEN) 10 MG tablet Take 0.5-1 tablets (5-10 mg total) by mouth at bedtime as needed. for sleep           Past Medical History:  Diagnosis Date   Allergic rhinoconjunctivitis    Allergy history unknown    Anxiety    Arthritis    DDD, spondylosis   Asthma    Chronic back pain    Has  had several surgeries.  He uses a walker   Chronic kidney disease    renal calculi   Depression    Diabetes mellitus without complication ([place])    dx in 2007   Diabetes mellitus, type II ([place])    Eczema    Headache(784.0)    sinus related    History of kidney stones    Hx of pulmonary embolus 2017   Hyperlipidemia    Hypertension    Insomnia    Knee pain    Left ankle pain    Mental disorder    Mood disorder ([place])    OSA on CPAP    PSG 05/24/2016: AHI 17/hour 02 mean 76%.  HSAT 06/13/17, AHI 11-hour 02 mean 79%   Recurrent upper respiratory infection (URI)    Sleep apnea       Objective:    Wts  01/24/2023       175   07/08/19 186 lb 3.2 oz (84.5 kg)  07/01/19 189 lb 6.4 oz (85.9 kg)  06/05/19 191 lb (86.6 kg)      Vital signs reviewed  01/24/2023  - Note at rest 02 sats  97% on RA   General appearance:    amb mildly obese bf nad   HEENT : Oropharynx  clear      Nasal turbinates nl / eyes slt red R>L    NECK :  without  apparent JVD/ palpable Nodes/TM    LUNGS: no acc muscle use,  Nl contour chest which is clear to A and P bilaterally without cough on insp or exp maneuvers   CV:  RRR  no s3 or murmur or increase in P2, and no edema   ABD:  soft and nontender with nl inspiratory excursion in the supine position. No bruits or organomegaly appreciated   MS:  Nl gait/ ext warm without deformities Or obvious joint restrictions  calf tenderness, cyanosis or clubbing    SKIN: warm and dry without lesions    NEURO:  alert, approp, nl sensorium with  no motor or cerebellar deficits apparent.              .    Assessment

## 2023-01-23 NOTE — Telephone Encounter (Signed)
Patient called in - DOB verified- stated [facility] is requesting a new medication refill prescription be sent.  Patient advised message would be forwarded to Tammy, our Biologic Coordinator regarding the refill request.  Patient verbalized understading, no questions.

## 2023-01-24 ENCOUNTER — Ambulatory Visit (INDEPENDENT_AMBULATORY_CARE_PROVIDER_SITE_OTHER): Payer: 59 | Admitting: Internal Medicine

## 2023-01-24 ENCOUNTER — Encounter: Payer: Self-pay | Admitting: Internal Medicine

## 2023-01-24 VITALS — BP 122/72 | HR 94 | Temp 97.6°F | Ht 63.0 in | Wt 175.0 lb

## 2023-01-24 DIAGNOSIS — J45991 Cough variant asthma: Secondary | ICD-10-CM

## 2023-01-24 MED ORDER — NUCALA 100 MG/ML ~~LOC~~ SOAJ: 100.0000 mg | SUBCUTANEOUS | 11 refills | Status: DC

## 2023-01-24 MED ORDER — FAMOTIDINE 20 MG PO TABS: ORAL_TABLET | ORAL | 2 refills | Status: DC

## 2023-01-24 NOTE — Assessment & Plan Note (Signed)
Onset in childhood -  Spirometry 06/05/2019  FEV1 1.07 (55%)  Ratio 0.74 though f/v not really physiologic  - placed on nucala May 10 2019  - 06/05/2019    try consolidate rx with symb 160/ prn neb and d/c dpis = spiriva/ respiclick hfa  - [id number]  After extensive coaching inhaler device,  effectiveness =   50%   >>> 01/24/2023 now on dupixent no longer coughing > try d/c PPI and just use pepcid bid with HS 1st gen H1 blockers per guidelines  for pnds at hs  - 01/24/2023  After extensive coaching inhaler device,  effectiveness =    75% (short ti)   Since under allergy care pulmonary f/u can be prn with all meds in hand using a trust but verify approach to confirm accurate Medication  Reconciliation The principal here is that until we are certain that the  patients are doing what we've asked, it makes no sense to ask them to do more.          Each maintenance medication was reviewed in detail including emphasizing most importantly the difference between maintenance and prns and under what circumstances the prns are to be triggered using an action plan format where appropriate.  Total time for H and P, chart review, counseling, reviewing hfa  device(s) and generating customized AVS unique to this office visit / same day charting = 30 min with pt not seen in > 3 y

## 2023-01-24 NOTE — Patient Instructions (Signed)
Stop pantoprazole and take pepcid 20 mg after breakfast  and an hour before bed   For drainage / throat tickle try take CHLORPHENIRAMINE  4 mg  ("Allergy Relief" 65m  [facility] in the blue box usually on bottom shelf)  take one every 4 hours as needed - extremely effective and inexpensive over the counter- may cause drowsiness so start with just a dose or two an hour before bedtime and see how you tolerate it before trying in daytime.   If not doing better with cough, please return with your repiratory and allergy meds

## 2023-01-24 NOTE — Addendum Note (Signed)
Addended by: Carin Hock on: 01/24/2023 08:42 AM   Modules accepted: Orders

## 2023-01-24 NOTE — Telephone Encounter (Signed)
Rx sent to [facility]

## 2023-01-30 ENCOUNTER — Ambulatory Visit (INDEPENDENT_AMBULATORY_CARE_PROVIDER_SITE_OTHER): Payer: 59 | Admitting: Psychiatry

## 2023-01-30 DIAGNOSIS — F419 Anxiety disorder, unspecified: Secondary | ICD-10-CM

## 2023-01-30 NOTE — Progress Notes (Signed)
[facility] Counselor/Therapist Progress Note  Patient ID: Chelsea Benson, MRN: [id number],    Date: 01/30/2023  Time Spent: 50 minutes   Treatment Type: Individual Therapy  Virtual Visit via Telehealth Note: MyChart Video Connected with patient by a telemedicine/telehealth application, with their informed consent, and verified patient privacy and that I am speaking with the correct person using two identifiers. I discussed the limitations, risks, security and privacy concerns of performing psychotherapy and the availability of in person appointments. I also discussed with the patient that there may be a patient responsible charge related to this service. The patient expressed understanding and agreed to proceed. I discussed the treatment planning with the patient. The patient was provided an opportunity to ask questions and all were answered. The patient agreed with the plan and demonstrated an understanding of the instructions. The patient was advised to call  our office if  symptoms worsen or feel they are in a crisis state and need immediate contact.   Therapist Location: office Patient Location: home   Reported Symptoms:  anxiety, frustration, "not much depression at all right now"  Mental Status Exam:  Appearance:   Casual     Behavior:  Appropriate, Sharing, and Motivated  Motor:  Uses cane and rollater when walking  Speech/Language:   Clear and Coherent  Affect:  anxious  Mood:  anxious  Thought process:  goal directed  Thought content:    Rumination  Sensory/Perceptual disturbances:    WNL  Orientation:  oriented to person, place, time/date, situation, day of week, month of year, year, and stated date of Feb. 26, 2024  Attention:  Good  Concentration:  Good  Memory:  "Sometimes good and sometimes  Fund of knowledge:   Good  Insight:    Good and Fair  Judgment:   Good  Impulse Control:  Good   Risk Assessment: Danger to Self:  No Self-injurious Behavior:  No Danger to Others: No Duty to Warn:no Physical Aggression / Violence:No  Access to Firearms a concern: No  Gang Involvement:No   Subjective:   Patient today reporting anxiety and frustration regarding recent issue with muscle spasms in her back and got injection at [facility] (Dr. Alyson Ingles) and is no longer having spasms "because they also gave me medication. Had fallen previously but "feeling much better now." Is to see Dr. Saintclair Halsted tomorrow.  New PCP at [facility] on [address] (Dr. Vanessa Wailea). Today "needed to talk more about the problems she is experiencing with her live-in alcoholic brother." This is an onging issue and patient has difficulty setting healthy limits with him even though she has worked on this in sessions. Discussed today alternative for having more support from others in family. Some healing occurred within relationship with one of her adult children. Reviewed healthy limits that she can use within family and encouraged patient in having healthier boundaries with family members.   Interventions: Cognitive Behavioral Therapy and Ego-Supportive  Long term goal: (measurable) Reduce overall level, frequency, and intensity of the anxiety so that daily functioning is not impaired.  Patient will eventually report a rating of "4" or less" on 1-10 anxiety scale for a 76-monthtime period where her daily functioning is not impaired.  Short term goal: Increase understanding of beliefs and messages that produce the worry and anxiety. Strategy: Patient will explore and work to stop cognitive messages that feed anxiety and work to replace them with more positive and empowering messages  Diagnosis:   ICD-10-CM  1. Anxiety disorder, unspecified type  F41.9      Plan:  Patient today continues working on her own emotional "issues" and also issues regarding family, especially in her need to have and keep clear boundaries.  Just the patient on patient's part today is good but is very  frustrated with family situation and not having much support regarding alcoholic brother that lives with her.  Plans to talk with 2 different family members soon and hoping for better results.  Still having some health concerns and following up with needed appointments.  Has made some progress overall in her coping and trying to move in a more positive direction.  Needs to continue with goal-directed behaviors and build up on the progress that she has made. Encouraged patient in her practice of more self affirming and positive behaviors as noted in session including: Remaining in contact with supportive people, continue to decrease her exposure to disturbing news on TV or watching online as this feeds her anxiety, stay on her prescribed medications, trusting herself more and recognizing her strengths versus just her limitations, setting limits with others who are unhealthy for her, get outside on her porch when possible as that is a place she really enjoys being, healthy nutrition, stay in the present focusing on what she can change her control rather than getting stuck in the past or jumping to forehead, encouraging self talk, and recognize the strength she shows working with goal-directed behaviors to move in a direction that supports her improved emotional health and outlook.  Goal review and progress/challenges noted with patient.  Next appointment within 3 weeks.  This record has been created using [employer].  Chart creation errors have been sought, but may not always have been located and corrected.  Such creation errors do not reflect on the standard of medical care provided.   Shanon Ace, LCSW

## 2023-01-31 ENCOUNTER — Other Ambulatory Visit: Payer: Self-pay

## 2023-01-31 ENCOUNTER — Ambulatory Visit (INDEPENDENT_AMBULATORY_CARE_PROVIDER_SITE_OTHER): Payer: 59 | Admitting: Internal Medicine

## 2023-01-31 DIAGNOSIS — J4540 Moderate persistent asthma, uncomplicated: Secondary | ICD-10-CM | POA: Diagnosis not present

## 2023-01-31 DIAGNOSIS — M5440 Lumbago with sciatica, unspecified side: Secondary | ICD-10-CM | POA: Diagnosis not present

## 2023-01-31 DIAGNOSIS — J3089 Other allergic rhinitis: Secondary | ICD-10-CM | POA: Diagnosis not present

## 2023-01-31 DIAGNOSIS — R052 Subacute cough: Secondary | ICD-10-CM | POA: Diagnosis not present

## 2023-01-31 MED ORDER — IPRATROPIUM BROMIDE 0.06 % NA SOLN: 2.0000 | Freq: Four times a day (QID) | NASAL | 5 refills | Status: DC | PRN

## 2023-01-31 MED ORDER — FLUTICASONE PROPIONATE 50 MCG/ACT NA SUSP: NASAL | 1 refills | Status: DC

## 2023-01-31 MED ORDER — AZELASTINE HCL 0.1 % NA SOLN: 2.0000 | Freq: Two times a day (BID) | NASAL | 1 refills | Status: DC

## 2023-01-31 NOTE — Patient Instructions (Addendum)
Asthma Continue Symbicort 160- 2 puffs twice a day to prevent cough or wheeze. Continue Montelukast '10mg'$  once a day to prevent cough or wheeze. Continue Nucala injections once every 4 weeks. Asthma Action Plan (during respiratory illness or asthma flare): add in Flovent 2 puffs twice a day for 1-2 weeks and can stop once illness/symptoms have resolved.  Asthma control goals:  Full participation in all desired activities (may need albuterol before activity) Albuterol use two time or less a week on average (not counting use with activity) Cough interfering with sleep two time or less a month Oral steroids no more than once a year No hospitalizations   Allergic Rhinitis/Allergic Conjunctivitis Chronic Cough with Post Nasal Drainage - Discussed appropriate technique with nasal sprays.  With uncontrolled upper respiratory symptoms and post nasal drainage, that could be the cause for her cough.  Will also obtain CXR to rule out any acute/subacute pathology.  Will try adding Ipratroprium also.  -Continue allergen avoidance measures directed toward pollens, mold, pet, dust mite, cockroach, and mouse urine. - Use nasal saline rinses before nose sprays such as with Neilmed Sinus Rinse.  Use distilled water.   - Use Flonase 2 sprays each nostril daily or 1 spray twice daily. Aim upward and outward. - Use Azelastine 2 sprays each nostril twice daily. Aim upward and outward. - Use Ipratroprium 1-2 sprays up to four times daily as needed for runny nose. Aim upward and outward. - Use Allegra '180mg'$  daily.  - Use Singulair '10mg'$  daily.   - For eyes, use Bepreve 1 eye drop daily as needed for itchy, watery eyes.  Also use lubricating eye drops as needed.  - If unimproved, recommend follow up with Dr. Nelva Bush in person, NOT telehealth.  Otherwise, keep regular follow up with her as scheduled 05/25/2023.

## 2023-01-31 NOTE — Progress Notes (Signed)
RE: Chelsea Benson MRN: [id number] DOB: 03-04-1951 Date of Telemedicine Visit: 01/31/2023  Referring provider: Marda Stalker, PA-C Primary care provider: Marda Stalker, PA-C  Chief Complaint: Cough (About 3 months now. Mucous (yellow color) coming out of nose and mouth. Taking all medications as prescribed. Nothing has helped. )   Telemedicine Follow Up Visit via Telephone: I connected with Neaveh Gobrecht for a follow up on 01/31/23 by telephone and verified that I am speaking with the correct person using two identifiers.   I discussed the limitations, risks, security and privacy concerns of performing an evaluation and management service by telephone and the availability of in person appointments. I also discussed with the patient that there may be a patient responsible charge related to this service. The patient expressed understanding and agreed to proceed.  Patient is at home accompanied by herself who provided/contributed to the history.  Provider is at the office.  Visit start time: 3:29 PM Visit end time: 3:47 PM Insurance consent/check in by: front desk Medical consent and medical assistant/nurse: Trayce  History of Present Illness:  She is a 72 y.o. female, who is being followed for moderate persistent asthma, allergic rhinitis, food allergies and eczema. Her previous allergy office visit was in 11/2022 with Dr. Nelva Bush.   Reports having trouble with runny nose, congestion, mucoid drainage and wet coughing for the past 3-4 months.  She has noted a lot of post nasal drainage causing her cough. No fevers.  Denies any  She is using Flonase and Azelastine 1 spray BID with the wrong technique. She does do Netipot rinses at nighttime.  She waits a few minutes in between each spray.  She is on Singulair.   Denies any issues with asthma.  She rarely requires albuterol.  Not having much shortness of breath or wheezing with the cough.  Taking Nucala every month and Symbicort  19mg 2 puffs BID.  SArlyce Harmanat last visit showed possible restriction, no obstruction.   She saw Dr. WMelvyn Novas [facility] who thought all her symptoms were UACS and informed her to follow up with Allergy.   Otherwise, there have been no changes to her past medical history, surgical history, family history, or social history.  Assessment and Plan:  FEmyleis a 72y.o. female with:  Moderate Persistent Asthma Continue Symbicort 160- 2 puffs twice a day to prevent cough or wheeze. Continue Montelukast '10mg'$  once a day to prevent cough or wheeze. Continue Nucala injections once every 4 weeks. Asthma Action Plan (during respiratory illness or asthma flare): add in Flovent 2 puffs twice a day for 1-2 weeks and can stop once illness/symptoms have resolved.  Asthma control goals:  Full participation in all desired activities (may need albuterol before activity) Albuterol use two time or less a week on average (not counting use with activity) Cough interfering with sleep two time or less a month Oral steroids no more than once a year No hospitalizations   Allergic Rhinitis/Allergic Conjunctivitis Chronic Cough  - Discussed appropriate technique with nasal sprays.  With uncontrolled upper respiratory symptoms and post nasal drainage, that could be the cause for her cough.  Will also obtain CXR to rule out any acute/subacute pathology.  Will try adding Ipratroprium also.  -Continue allergen avoidance measures directed toward pollens, mold, pet, dust mite, cockroach, and mouse urine. - Use nasal saline rinses before nose sprays such as with Neilmed Sinus Rinse.  Use distilled water.   - Use Flonase 2 sprays each nostril daily or 1  spray twice daily. Aim upward and outward. - Use Azelastine 2 sprays each nostril twice daily. Aim upward and outward. - Use Ipratroprium 1-2 sprays up to four times daily as needed for runny nose. Aim upward and outward. - Use Allegra '180mg'$  daily.  - Use Singulair '10mg'$  daily.    - For eyes, use Bepreve 1 eye drop daily as needed for itchy, watery eyes.  Also use lubricating eye drops as needed.  - If unimproved, recommend follow up with Dr. Nelva Bush in person, NOT telehealth.  Otherwise, keep regular follow up with her as scheduled 05/25/2023.   Diagnostics: None.  Medication List:  Current Outpatient Medications  Medication Sig Dispense Refill   ACCU-CHEK GUIDE test strip TEST BID PRN  1   albuterol (PROVENTIL) (2.5 MG/3ML) 0.083% nebulizer solution USE 1 VIAL IN NEBULIZER EVERY 4 HOURS - and as needed 75 mL 3   albuterol (VENTOLIN HFA) 108 (90 Base) MCG/ACT inhaler USE 2 INHALATIONS BY MOUTH EVERY 4 HOURS AS NEEDED FOR WHEEZING  OR SHORTNESS OF BREATH 26.8 g 4   ARIPiprazole (ABILIFY) 2 MG tablet Take 2 tablets (4 mg total) by mouth daily. 180 tablet 0   azelastine (ASTELIN) 0.1 % nasal spray Place 2 sprays into both nostrils 2 (two) times daily. 90 mL 1   Bepotastine Besilate 1.5 % SOLN Place 1 drop into both eyes 2 (two) times daily. 30 mL 1   Blood Glucose Monitoring Suppl (ACCU-CHEK GUIDE) w/Device KIT See admin instructions.  1   budesonide-formoterol (SYMBICORT) 160-4.5 MCG/ACT inhaler INHALE 2 INHALATIONS BY MOUTH  INTO THE LUNGS IN THE MORNING  AND AT BEDTIME 10.2 g 11   busPIRone (BUSPAR) 30 MG tablet Take 1 tablet (30 mg total) by mouth 2 (two) times daily. 180 tablet 0   CALCIUM-VITAMIN D PO Take 1 tablet by mouth daily.     Carbinoxamine Maleate 4 MG TABS Take 1 tablet (4 mg total) by mouth 3 (three) times daily as needed. 180 tablet 1   clonazePAM (KLONOPIN) 0.5 MG tablet Take 1/2-1 tab po TID prn anxiety. (Do not combine with Ambien) 270 tablet 1   desonide (DESOWEN) 0.05 % ointment APPLY TO AFFECTED AREA(S)  TOPICALLY TWICE DAILY AS  NEEDED 180 g 1   diltiazem (CARDIZEM CD) 300 MG 24 hr capsule Take 1 capsule (300 mg total) by mouth daily. 100 capsule 2   diltiazem (CARDIZEM) 60 MG tablet TAKE DILITAZEM 60 MG BY MOUTH AS NEEDED FOR PALPITATIONS. YOU  CAN TAKE EVERY 6 HOURS AS NEEDED UP TO TWICE DAILY WITH EACH EPISODE 60 tablet 7   ELIQUIS 5 MG TABS tablet TAKE 1 TABLET BY MOUTH TWICE  DAILY 200 tablet 2   EPINEPHrine 0.3 mg/0.3 mL IJ SOAJ injection INJECT INTRAMUSCULARLY 1  PEN AS NEEDED FOR ALLERGIC  RESPONSE AS DIRECTED BY MD. SEEK MEDICAL HELP AFTER  USE. 2 each 0   famotidine (PEPCID) 20 MG tablet Take after breakfast and an hour before bedime 180 tablet 2   fexofenadine (ALLERGY RELIEF) 180 MG tablet TAKE 1 TABLET(180 MG) BY MOUTH DAILY 30 tablet 2   fluticasone (FLONASE) 50 MCG/ACT nasal spray USE 2 SPRAYS IN BOTH NOSTRILS  DAILY 48 g 1   fluticasone (FLOVENT HFA) 110 MCG/ACT inhaler Inhale 4 puffs twice a day with a spacer for the next 2 weeks or until cough and wheeze free 1 each 0   hydrochlorothiazide (HYDRODIURIL) 25 MG tablet TAKE 1 TABLET BY MOUTH DAILY 90 tablet 3   lidocaine (LIDODERM)  5 % Place 1 patch onto the skin daily. Remove & Discard patch within 12 hours or as directed by MD 30 patch 0   meloxicam (MOBIC) 7.5 MG tablet Take 1 tablet (7.5 mg total) by mouth daily. 30 tablet 0   Mepolizumab (NUCALA) 100 MG/ML SOAJ Inject 1 mL (100 mg total) into the skin every 28 (twenty-eight) days. 1 mL 11   metFORMIN (GLUCOPHAGE-XR) 500 MG 24 hr tablet Take 500 mg by mouth in the morning and at bedtime.     methocarbamol (ROBAXIN) 500 MG tablet Take 500 mg by mouth as needed for muscle spasms.     mirtazapine (REMERON) 30 MG tablet TAKE 1 TABLET BY MOUTH AT  BEDTIME 90 tablet 3   montelukast (SINGULAIR) 10 MG tablet TAKE 1 TABLET BY MOUTH AT  BEDTIME 100 tablet 2   Oxcarbazepine (TRILEPTAL) 300 MG tablet Take 2 tablets (600 mg total) by mouth at bedtime. 180 tablet 1   oxybutynin (DITROPAN-XL) 10 MG 24 hr tablet Take 10 mg by mouth daily.     potassium chloride SA (KLOR-CON M) 20 MEQ tablet TAKE 1 TABLET BY MOUTH DAILY 100 tablet 1   pravastatin (PRAVACHOL) 20 MG tablet Take 1 tablet (20 mg total) by mouth at bedtime. 90 tablet 3    pregabalin (LYRICA) 100 MG capsule Take 100 mg by mouth 3 (three) times daily.     Respiratory Therapy Supplies (NEBULIZER) DEVI Use as directed with nebulizer solution. 1 each 1   Respiratory Therapy Supplies (NEBULIZER/TUBING/MOUTHPIECE) KIT Use as directed with nebulizer machine 1 kit 12   Semaglutide, 1 MG/DOSE, (OZEMPIC, 1 MG/DOSE,) 2 MG/1.5ML SOPN Take 1 mg by mouth once a week.     triamcinolone (NASACORT) 55 MCG/ACT AERO nasal inhaler Place 2 sprays into the nose daily. 16.9 mL 5   [START ON 02/03/2023] zolpidem (AMBIEN) 10 MG tablet Take 0.5-1 tablets (5-10 mg total) by mouth at bedtime as needed. for sleep 90 tablet 1   No current facility-administered medications for this visit.   Allergies: Allergies  Allergen Reactions   Ramipril Other (See Comments)   Cymbalta [Duloxetine Hcl] Other (See Comments)    Caused hair loss   Methylprednisolone Acetate Hives and Rash    05/29/20 developed hives "all over my back" after MBB; resolved with Benadryl and "a few days."  Can tolerate Dexamethasone/Decadron. 05/29/20 developed hives "all over my back" after MBB; resolved with Benadryl and "a few days."  Can tolerate Dexamethasone/Decadron.   Rosuvastatin Calcium Other (See Comments) and Cough    Rapid heart rate   Almond Meal Itching and Swelling    Tongue swells and itches   Almond Oil Itching and Swelling    Itching and swelling of tongue    Carvedilol Cough   Fish Allergy Itching    Halibut fish   Losartan Potassium Cough   Morphine And Related Itching   I reviewed her past medical history, social history, family history, and environmental history and no significant changes have been reported from previous visits.  Review of Systems  Objective:  Physical exam not obtained as encounter was done via telephone.   Previous notes and tests were reviewed.  I discussed the assessment and treatment plan with the patient. The patient was provided an opportunity to ask questions and all  were answered. The patient agreed with the plan and demonstrated an understanding of the instructions.   The patient was advised to call back or seek an in-person evaluation if the symptoms worsen or  if the condition fails to improve as anticipated.  I provided 18 minutes of non-face-to-face time during this encounter.  Harlon Flor, MD [place] [facility] [place]

## 2023-02-01 ENCOUNTER — Ambulatory Visit
Admission: RE | Admit: 2023-02-01 | Discharge: 2023-02-01 | Disposition: A | Discharge status: Home / Self Care | Payer: 59 | Source: Ambulatory Visit | Attending: Internal Medicine | Admitting: Internal Medicine

## 2023-02-01 DIAGNOSIS — R052 Subacute cough: Secondary | ICD-10-CM

## 2023-02-01 DIAGNOSIS — R053 Chronic cough: Secondary | ICD-10-CM | POA: Diagnosis not present

## 2023-02-02 ENCOUNTER — Telehealth: Payer: Self-pay

## 2023-02-02 NOTE — Telephone Encounter (Signed)
Patient called in  - DOB verified - stated she would like a call once Chest xray results have come in.  Forwarding message to provider to review results.

## 2023-02-03 NOTE — Telephone Encounter (Signed)
Patient called back to ask about lab results. Read out Dr. Serita Grit message to patient. Patient verbalized understanding - had no further questions.

## 2023-02-06 ENCOUNTER — Ambulatory Visit (INDEPENDENT_AMBULATORY_CARE_PROVIDER_SITE_OTHER): Payer: 59 | Admitting: Psychiatry

## 2023-02-06 DIAGNOSIS — F419 Anxiety disorder, unspecified: Secondary | ICD-10-CM | POA: Diagnosis not present
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
## 2023-02-11 ENCOUNTER — Emergency Department (HOSPITAL_COMMUNITY): Payer: 59

## 2023-02-11 ENCOUNTER — Other Ambulatory Visit: Payer: Self-pay

## 2023-02-11 ENCOUNTER — Emergency Department (HOSPITAL_COMMUNITY)
Admission: EM | Admit: 2023-02-11 | Discharge: 2023-02-11 | Disposition: A | Discharge status: Home / Self Care | Payer: 59 | Attending: Emergency Medicine | Admitting: Emergency Medicine

## 2023-02-11 DIAGNOSIS — R109 Unspecified abdominal pain: Secondary | ICD-10-CM | POA: Diagnosis not present

## 2023-02-11 DIAGNOSIS — Z79899 Other long term (current) drug therapy: Secondary | ICD-10-CM | POA: Insufficient documentation

## 2023-02-11 DIAGNOSIS — Z7901 Long term (current) use of anticoagulants: Secondary | ICD-10-CM | POA: Insufficient documentation

## 2023-02-11 DIAGNOSIS — Z7984 Long term (current) use of oral hypoglycemic drugs: Secondary | ICD-10-CM | POA: Insufficient documentation

## 2023-02-11 DIAGNOSIS — M545 Low back pain, unspecified: Secondary | ICD-10-CM | POA: Insufficient documentation

## 2023-02-11 DIAGNOSIS — I10 Essential (primary) hypertension: Secondary | ICD-10-CM | POA: Diagnosis not present

## 2023-02-11 DIAGNOSIS — E119 Type 2 diabetes mellitus without complications: Secondary | ICD-10-CM | POA: Diagnosis not present

## 2023-02-11 DIAGNOSIS — M5459 Other low back pain: Secondary | ICD-10-CM | POA: Diagnosis not present

## 2023-02-11 DIAGNOSIS — J45909 Unspecified asthma, uncomplicated: Secondary | ICD-10-CM | POA: Diagnosis not present

## 2023-02-11 LAB — CBC WITH DIFFERENTIAL/PLATELET
Abs Immature Granulocytes: 0.02 10*3/uL (ref 0.00–0.07)
Basophils Absolute: 0 10*3/uL (ref 0.0–0.1)
Basophils Relative: 0 %
Eosinophils Absolute: 0 10*3/uL (ref 0.0–0.5)
Eosinophils Relative: 0 %
HCT: 34 % — ABNORMAL LOW (ref 36.0–46.0)
Hemoglobin: 10.3 g/dL — ABNORMAL LOW (ref 12.0–15.0)
Immature Granulocytes: 0 %
Lymphocytes Relative: 15 %
Lymphs Abs: 1.3 10*3/uL (ref 0.7–4.0)
MCH: 24.6 pg — ABNORMAL LOW (ref 26.0–34.0)
MCHC: 30.3 g/dL (ref 30.0–36.0)
MCV: 81.1 fL (ref 80.0–100.0)
Monocytes Absolute: 0.9 10*3/uL (ref 0.1–1.0)
Monocytes Relative: 11 %
Neutro Abs: 6.2 10*3/uL (ref 1.7–7.7)
Neutrophils Relative %: 74 %
Platelets: 368 10*3/uL (ref 150–400)
RBC: 4.19 MIL/uL (ref 3.87–5.11)
RDW: 18.6 % — ABNORMAL HIGH (ref 11.5–15.5)
WBC: 8.5 10*3/uL (ref 4.0–10.5)
nRBC: 0 % (ref 0.0–0.2)

## 2023-02-11 LAB — CK: Total CK: 89 U/L (ref 38–234)

## 2023-02-11 LAB — COMPREHENSIVE METABOLIC PANEL
ALT: 16 U/L (ref 0–44)
AST: 18 U/L (ref 15–41)
Albumin: 3.8 g/dL (ref 3.5–5.0)
Alkaline Phosphatase: 60 U/L (ref 38–126)
Anion gap: 11 (ref 5–15)
BUN: 16 mg/dL (ref 8–23)
CO2: 25 mmol/L (ref 22–32)
Calcium: 9.7 mg/dL (ref 8.9–10.3)
Chloride: 104 mmol/L (ref 98–111)
Creatinine, Ser: 0.66 mg/dL (ref 0.44–1.00)
GFR, Estimated: >60 mL/min (ref >60)
Glucose, Bld: 89 mg/dL (ref 70–99)
Potassium: 3.9 mmol/L (ref 3.5–5.1)
Sodium: 140 mmol/L (ref 135–145)
Total Bilirubin: 0.3 mg/dL (ref 0.3–1.2)
Total Protein: 6.7 g/dL (ref 6.5–8.1)

## 2023-02-11 LAB — URINALYSIS, ROUTINE W REFLEX MICROSCOPIC
Bacteria, UA: NONE SEEN
Bilirubin Urine: NEGATIVE
Glucose, UA: NEGATIVE mg/dL
Hgb urine dipstick: NEGATIVE
Ketones, ur: 5 mg/dL — AB
Nitrite: NEGATIVE
Protein, ur: NEGATIVE mg/dL
Specific Gravity, Urine: 1.02 (ref 1.005–1.030)
pH: 6 (ref 5.0–8.0)

## 2023-02-11 MED ORDER — TIZANIDINE HCL 4 MG PO CAPS: 4.0000 mg | ORAL_CAPSULE | Freq: Three times a day (TID) | ORAL | 0 refills | Status: AC

## 2023-02-11 MED ORDER — FENTANYL CITRATE PF 50 MCG/ML IJ SOSY
50.0000 ug | PREFILLED_SYRINGE | Freq: Once | INTRAMUSCULAR | Status: AC
  Administered 2023-02-11: 50 ug via INTRAVENOUS
  Filled 2023-02-11: qty 1

## 2023-02-11 MED ORDER — IOHEXOL 350 MG/ML SOLN
75.0000 mL | Freq: Once | INTRAVENOUS | Status: AC | PRN
  Administered 2023-02-11: 75 mL via INTRAVENOUS

## 2023-02-11 MED ORDER — KETOROLAC TROMETHAMINE 15 MG/ML IJ SOLN
15.0000 mg | Freq: Once | INTRAMUSCULAR | Status: AC
  Administered 2023-02-11: 15 mg via INTRAVENOUS
  Filled 2023-02-11: qty 1

## 2023-02-11 MED ORDER — TIZANIDINE HCL 4 MG PO TABS
4.0000 mg | ORAL_TABLET | Freq: Once | ORAL | Status: AC
  Administered 2023-02-11: 4 mg via ORAL
  Filled 2023-02-11: qty 1

## 2023-02-11 MED ORDER — TIZANIDINE HCL 4 MG PO CAPS: 4.0000 mg | ORAL_CAPSULE | Freq: Three times a day (TID) | ORAL | 0 refills | Status: DC

## 2023-02-11 NOTE — ED Triage Notes (Signed)
Pt BIB EMS from home. Per EMS, pt c/o bilateral flank pain that started yesterday afternoon. Area is painful with palpation and movement. Pt denies urinary symptoms. Hx of kidney stones but says this feels different. A/Ox4.

## 2023-02-11 NOTE — ED Notes (Signed)
Patient verbalizes understanding of discharge instructions. Opportunity for questioning and answers were provided. Armband removed by staff, pt discharged from ED. Pt taken to ED waiting room via wheel chair.  

## 2023-02-11 NOTE — ED Provider Notes (Signed)
[facility] Provider Note   CSN: [id number] Arrival date & time: 02/11/23  1519     History  Chief Complaint  Patient presents with   Flank Pain    bilateral   Back Pain    Chelsea Benson is a 72 y.o. female.  This is a 72 year old female with history of hypertension, hyperlipidemia, type 2 diabetes, paroxysmal A-fib on Eliquis, asthma and anxiety presenting to the ED for flank pain and back pain.  Patient states yesterday she was cooking when she is experienced sudden onset mid and lower back pain which radiated around to her abdomen.  She states it is worse with movement, palpation, it is minimally relieved by rest.  She has tried her Robaxin at home which has not helped, she has not found any therapeutic interventions which helped.  She denies any chest pain, does endorse some shortness of breath which is pain related.  Denies any fevers, chills, dysuria, hematuria.     Home Medications Prior to Admission medications   Medication Sig Start Date End Date Taking? Authorizing Provider  ACCU-CHEK GUIDE test strip TEST BID PRN 09/17/18   [provider]  albuterol (PROVENTIL) (2.5 MG/3ML) 0.083% nebulizer solution USE 1 VIAL IN NEBULIZER EVERY 4 HOURS - and as needed 12/12/22   Kennith Gain, MD  albuterol (VENTOLIN HFA) 108 (90 Base) MCG/ACT inhaler USE 2 INHALATIONS BY MOUTH EVERY 4 HOURS AS NEEDED FOR WHEEZING  OR SHORTNESS OF BREATH 05/30/22   Kennith Gain, MD  ARIPiprazole (ABILIFY) 2 MG tablet Take 2 tablets (4 mg total) by mouth daily. 12/26/22   Thayer Headings, PMHNP  azelastine (ASTELIN) 0.1 % nasal spray Place 2 sprays into both nostrils 2 (two) times daily. 01/31/23   Larose Kells, MD  Bepotastine Besilate 1.5 % SOLN Place 1 drop into both eyes 2 (two) times daily. 01/10/23   Kennith Gain, MD  Blood Glucose Monitoring Suppl (ACCU-CHEK GUIDE) w/Device KIT See admin instructions. 09/18/18    [provider]  budesonide-formoterol (SYMBICORT) 160-4.5 MCG/ACT inhaler INHALE 2 INHALATIONS BY MOUTH  INTO THE LUNGS IN THE MORNING  AND AT BEDTIME 10/13/22   Ambs, Kathrine Cords, FNP  busPIRone (BUSPAR) 30 MG tablet Take 1 tablet (30 mg total) by mouth 2 (two) times daily. 12/26/22 03/26/23  Thayer Headings, PMHNP  CALCIUM-VITAMIN D PO Take 1 tablet by mouth daily.    [provider]  Carbinoxamine Maleate 4 MG TABS Take 1 tablet (4 mg total) by mouth 3 (three) times daily as needed. 08/09/22   Dara Hoyer, FNP  clonazePAM (KLONOPIN) 0.5 MG tablet Take 1/2-1 tab po TID prn anxiety. (Do not combine with Ambien) 12/29/22   Thayer Headings, PMHNP  desonide (DESOWEN) 0.05 % ointment APPLY TO AFFECTED AREA(S)  TOPICALLY TWICE DAILY AS  NEEDED 02/03/21   Kennith Gain, MD  diltiazem (CARDIZEM CD) 300 MG 24 hr capsule Take 1 capsule (300 mg total) by mouth daily. 12/28/22   Leonie Man, MD  diltiazem (CARDIZEM) 60 MG tablet TAKE DILITAZEM 60 MG BY MOUTH AS NEEDED FOR PALPITATIONS. YOU CAN TAKE EVERY 6 HOURS AS NEEDED UP TO TWICE DAILY WITH EACH EPISODE 03/08/22   Leonie Man, MD  ELIQUIS 5 MG TABS tablet TAKE 1 TABLET BY MOUTH TWICE  DAILY 08/23/22   Leonie Man, MD  EPINEPHrine 0.3 mg/0.3 mL IJ SOAJ injection INJECT INTRAMUSCULARLY 1  PEN AS NEEDED FOR ALLERGIC  RESPONSE AS  DIRECTED BY MD. [place] AFTER  USE. 08/09/22   Ambs, Kathrine Cords, FNP  famotidine (PEPCID) 20 MG tablet Take after breakfast and an hour before bedime 01/24/23   Tanda Rockers, MD  fexofenadine (ALLERGY RELIEF) 180 MG tablet TAKE 1 TABLET(180 MG) BY MOUTH DAILY 01/11/23   Kennith Gain, MD  fluticasone [facility]) 50 MCG/ACT nasal spray USE 2 SPRAYS IN BOTH NOSTRILS  DAILY 01/31/23   Larose Kells, MD  fluticasone (FLOVENT HFA) 110 MCG/ACT inhaler Inhale 4 puffs twice a day with a spacer for the next 2 weeks or until cough and wheeze free 08/09/22   Dara Hoyer, FNP  hydrochlorothiazide  (HYDRODIURIL) 25 MG tablet TAKE 1 TABLET BY MOUTH DAILY 09/19/22   Leonie Man, MD  ipratropium (ATROVENT) 0.06 % nasal spray Place 2 sprays into both nostrils 4 (four) times daily as needed for rhinitis. 01/31/23   Larose Kells, MD  lidocaine (LIDODERM) 5 % Place 1 patch onto the skin daily. Remove & Discard patch within 12 hours or as directed by MD 10/07/22   Teodora Medici, FNP  meloxicam (MOBIC) 7.5 MG tablet Take 1 tablet (7.5 mg total) by mouth daily. 12/10/21   Francene Finders, PA-C  Mepolizumab (NUCALA) 100 MG/ML SOAJ Inject 1 mL (100 mg total) into the skin every 28 (twenty-eight) days. 01/24/23   Kennith Gain, MD  metFORMIN (GLUCOPHAGE-XR) 500 MG 24 hr tablet Take 500 mg by mouth in the morning and at bedtime. 07/02/19   [provider]  methocarbamol (ROBAXIN) 500 MG tablet Take 500 mg by mouth as needed for muscle spasms.    [provider]  mirtazapine (REMERON) 30 MG tablet TAKE 1 TABLET BY MOUTH AT  BEDTIME 11/22/22   Thayer Headings, PMHNP  montelukast (SINGULAIR) 10 MG tablet TAKE 1 TABLET BY MOUTH AT  BEDTIME 01/02/23   Kennith Gain, MD  Oxcarbazepine (TRILEPTAL) 300 MG tablet Take 2 tablets (600 mg total) by mouth at bedtime. 11/22/22   Thayer Headings, PMHNP  oxybutynin (DITROPAN-XL) 10 MG 24 hr tablet Take 10 mg by mouth daily. 12/17/21   [provider]  potassium chloride SA (KLOR-CON M) 20 MEQ tablet TAKE 1 TABLET BY MOUTH DAILY 01/23/23   Leonie Man, MD  pravastatin (PRAVACHOL) 20 MG tablet Take 1 tablet (20 mg total) by mouth at bedtime. 09/29/21   Leonie Man, MD  pregabalin (LYRICA) 100 MG capsule Take 100 mg by mouth 3 (three) times daily.    [provider]  Respiratory Therapy Supplies (NEBULIZER) DEVI Use as directed with nebulizer solution. 06/01/18   Valentina Shaggy, MD  Respiratory Therapy Supplies (NEBULIZER/TUBING/MOUTHPIECE) KIT Use as directed with nebulizer machine 04/17/20   Padgett,  Rae Halsted, MD  Semaglutide, 1 MG/DOSE, (OZEMPIC, 1 MG/DOSE,) 2 MG/1.5ML SOPN Take 1 mg by mouth once a week.    [provider]  tiZANidine (ZANAFLEX) 4 MG capsule Take 1 capsule (4 mg total) by mouth 3 (three) times daily for 5 days. 02/11/23 02/16/23  Jimmie Molly, MD  triamcinolone (NASACORT) 55 MCG/ACT AERO nasal inhaler Place 2 sprays into the nose daily. 09/14/22   Ambs, Kathrine Cords, FNP  zolpidem (AMBIEN) 10 MG tablet Take 0.5-1 tablets (5-10 mg total) by mouth at bedtime as needed. for sleep 02/03/23   Thayer Headings, PMHNP      Allergies    Ramipril, Cymbalta [duloxetine hcl], Methylprednisolone acetate, Rosuvastatin calcium, Almond meal, Almond oil, Carvedilol, Fish allergy, Losartan  potassium, and Morphine and related    Review of Systems   Review of Systems  Constitutional:  Negative for chills and fever.  Gastrointestinal:  Positive for abdominal pain. Negative for nausea and vomiting.  Genitourinary:  Negative for dysuria and urgency.  Musculoskeletal:  Positive for back pain.  Neurological:  Negative for syncope and weakness.    Physical Exam Updated Vital Signs BP (!) 144/93   Pulse 94   Temp 97.9 F (36.6 C) (Oral)   Resp (!) 25   Ht '5\' 3"'$  (1.6 m)   Wt 78.9 kg   SpO2 98%   BMI 30.82 kg/m  Physical Exam Vitals and nursing note reviewed.  Constitutional:      General: She is not in acute distress. HENT:     Head: Normocephalic.     Nose: Nose normal.     Mouth/Throat:     Mouth: Mucous membranes are moist.  Eyes:     Pupils: Pupils are equal, round, and reactive to light.  Cardiovascular:     Rate and Rhythm: Normal rate and regular rhythm.     Pulses: Normal pulses.     Heart sounds: Normal heart sounds. No murmur heard.    No friction rub. No gallop.  Pulmonary:     Effort: Pulmonary effort is normal. No respiratory distress.     Breath sounds: No wheezing or rales.  Chest:     Chest wall: No tenderness.  Abdominal:     General: There is  no distension.     Palpations: Abdomen is soft.     Tenderness: There is no abdominal tenderness. There is right CVA tenderness and left CVA tenderness. There is no guarding or rebound.  Musculoskeletal:        General: Tenderness (Bilateral paraspinals extending from thoracic to lumbar spine) present.  Skin:    General: Skin is warm.     Capillary Refill: Capillary refill takes less than 2 seconds.  Neurological:     General: No focal deficit present.     Mental Status: She is alert and oriented to person, place, and time.     ED Results / Procedures / Treatments   Labs (all labs ordered are listed, but only abnormal results are displayed) Labs Reviewed  CBC WITH DIFFERENTIAL/PLATELET - Abnormal; Notable for the following components:      Result Value   Hemoglobin 10.3 (*)    HCT 34.0 (*)    MCH 24.6 (*)    RDW 18.6 (*)    All other components within normal limits  URINALYSIS, ROUTINE W REFLEX MICROSCOPIC - Abnormal; Notable for the following components:   Ketones, ur 5 (*)    Leukocytes,Ua TRACE (*)    Non Squamous Epithelial 0-5 (*)    All other components within normal limits  COMPREHENSIVE METABOLIC PANEL  CK    EKG None  Radiology CT ABDOMEN PELVIS W CONTRAST  Result Date: 02/11/2023 CLINICAL DATA:  Retroperitoneal bleed suspected EXAM: CT ABDOMEN AND PELVIS WITH CONTRAST TECHNIQUE: Multidetector CT imaging of the abdomen and pelvis was performed using the standard protocol following bolus administration of intravenous contrast. RADIATION DOSE REDUCTION: This exam was performed according to the departmental dose-optimization program which includes automated exposure control, adjustment of the mA and/or kV according to patient size and/or use of iterative reconstruction technique. CONTRAST:  72m OMNIPAQUE IOHEXOL 350 MG/ML SOLN COMPARISON:  None Available. FINDINGS: Lower chest: No acute abnormality. Hepatobiliary: No focal liver abnormality is seen. No gallstones,  gallbladder wall  thickening, or biliary dilatation. Pancreas: Unremarkable. No pancreatic ductal dilatation or surrounding inflammatory changes. Spleen: Normal in size without focal abnormality. Adrenals/Urinary Tract: Adrenal glands are unremarkable. Kidneys are normal, without renal calculi, focal lesion, or hydronephrosis. Bladder is unremarkable. Stomach/Bowel: Stomach is within normal limits. Appendix appears normal. No evidence of bowel wall thickening, distention, or inflammatory changes. Vascular/Lymphatic: No significant vascular findings are present. No enlarged abdominal or pelvic lymph nodes. Reproductive: Status post hysterectomy. No adnexal masses. Other: No abdominal wall hernia or abnormality. No abdominopelvic ascites. No retroperitoneal hematoma identified. Musculoskeletal: L2-L4 posterior fusion is present. Disc spacers are seen at L4-L5 and L5-S1. IMPRESSION: No acute process in the abdomen or pelvis. No retroperitoneal hematoma. Electronically Signed   By: Ronney Asters M.D.   On: 02/11/2023 18:46    Procedures Procedures    Medications Ordered in ED Medications  fentaNYL (SUBLIMAZE) injection 50 mcg (50 mcg Intravenous Given 02/11/23 1625)  tiZANidine (ZANAFLEX) tablet 4 mg (4 mg Oral Given 02/11/23 1626)  iohexol (OMNIPAQUE) 350 MG/ML injection 75 mL (75 mLs Intravenous Contrast Given 02/11/23 1828)  ketorolac (TORADOL) 15 MG/ML injection 15 mg (15 mg Intravenous Given 02/11/23 1934)    ED Course/ Medical Decision Making/ A&P                             Medical Decision Making Patient presents with back pain and flank pain.  Her comorbidities which complicate picture include atrial fibrillation on Eliquis, hypertension and diabetes.  As patient is on a blood thinner and is experiencing sudden onset back pain rating to her abdomen, cannot rule out retroperitoneal bleed, compression fracture, or aortic pathology.  Will obtain CTA abdomen and pelvis for further evaluation.  Patient's  presentation could also represent musculoskeletal etiology or UTI, less likely kidney stone given it is bilateral flank pain..  Will obtain screening labs including CBC, CMP, CK, urinalysis, EKG.  50 mcg fentanyl and 4 mg tizanidine given for pain.   I personally reviewed and interpreted patient's labs, CMP completely unremarkable, creatinine normal with no electrolyte abnormalities.  CBC shows a mildly decreased hemoglobin of 10.3, per chart review patient has history of anemia.  Urinalysis noninfectious.    I personally reviewed and interpreted patient's CT abdomen and pelvis with contrast, agree with radiology, no retroperitoneal hematoma, no obvious acute aortic pathology, appears normal size and caliber, no acute findings.    I personally reviewed and interpreted patient's EKG, sinus rhythm with rate 97, PR, QRS, QTc normal, mild diffuse ST-T wave flattening without evidence of acute ischemic changes.   Upon reevaluation patient still having some back pain, I am able to reproduce patient's pain by palpating the paraspinal muscles.  She has no aortic pathology or retroperitoneal hemorrhage.  She has no obvious kidney stone.  Labs are otherwise unremarkable.  I discussed with patient this is likely musculoskeletal.  Noted to be slightly tachypneic secondary to pain, lungs remain clear to auscultation bilaterally. I recommended Motrin and Tylenol at home, will give patient prescription for tizanidine and recommended lidocaine patches.  Patient given a dose of Toradol prior to discharge.  Return precautions given if she has worsening pain, chest pain or shortness of breath.  Recommended outpatient follow-up with PCP in 2 to 3 days.  Patient was stable at discharge.  Problems Addressed: Acute bilateral low back pain without sciatica: acute illness or injury that poses a threat to life or bodily functions  Amount and/or Complexity of  Data Reviewed Labs: ordered. Decision-making details documented  in ED Course. Radiology: ordered and independent interpretation performed. Decision-making details documented in ED Course. ECG/medicine tests: ordered and independent interpretation performed. Decision-making details documented in ED Course.  Risk Prescription drug management.          Final Clinical Impression(s) / ED Diagnoses Final diagnoses:  Acute bilateral low back pain without sciatica    Rx / DC Orders ED Discharge Orders          Ordered    tiZANidine (ZANAFLEX) 4 MG capsule  3 times daily,   Status:  Discontinued        02/11/23 1923    tiZANidine (ZANAFLEX) 4 MG capsule  3 times daily        02/11/23 1947              Jimmie Molly, MD 02/11/23 2022    Elnora Morrison, MD 02/15/23 [phone]

## 2023-02-11 NOTE — ED Notes (Signed)
Patient transported to CT 

## 2023-02-11 NOTE — ED Notes (Signed)
Pt c/o bilateral lower back pain that worsens with movement and palpation. Pt unable to find comfortable position. Pt denies painful/burning urination. Denies N/V/D.

## 2023-02-11 NOTE — ED Notes (Signed)
Varney Biles (daughter) [phone]. Would like an update

## 2023-02-11 NOTE — Discharge Instructions (Addendum)
You were evaluated in the emergency department for flank pain and back pain.  We think at this time its musculoskeletal pain, please use Motrin and Tylenol at home every 6 hours, I am sending you home with a prescription for Zanaflex, please take as prescribed.  We also recommend lidocaine patches for symptomatic relief.  Please follow-up with your primary care doctor in 2 days for reevaluation.  Return to the ED if your pain worsens or becomes intractable, if you have any chest pain or shortness of breath. Marland Kitchen

## 2023-02-12 ENCOUNTER — Other Ambulatory Visit: Payer: Self-pay | Admitting: Allergy

## 2023-02-13 NOTE — Addendum Note (Signed)
Addended byShanon Ace on: 02/13/2023 12:57 PM   Modules accepted: Level of Service

## 2023-02-14 DIAGNOSIS — M5440 Lumbago with sciatica, unspecified side: Secondary | ICD-10-CM | POA: Diagnosis not present

## 2023-02-16 ENCOUNTER — Ambulatory Visit: Payer: 59 | Admitting: Podiatry

## 2023-02-17 ENCOUNTER — Other Ambulatory Visit: Payer: Self-pay | Admitting: Cardiology

## 2023-02-17 DIAGNOSIS — I480 Paroxysmal atrial fibrillation: Secondary | ICD-10-CM

## 2023-02-17 NOTE — Telephone Encounter (Signed)
*  STAT* If patient is at the pharmacy, call can be transferred to refill team.   1. Which medications need to be refilled? (please list name of each medication and dose if known) ELIQUIS 5 MG TABS tablet   TAKE 1 TABLET BY MOUTH TWICE DAILY    2. Which pharmacy/location (including street and city if local pharmacy) is medication to be sent to?[place], [place]   3. Do they need a 30 day or 90 day supply? 90 Day Supply

## 2023-02-21 MED ORDER — APIXABAN 5 MG PO TABS: 5.0000 mg | ORAL_TABLET | Freq: Two times a day (BID) | ORAL | 2 refills | Status: DC

## 2023-02-21 NOTE — Telephone Encounter (Signed)
Prescription refill request for Eliquis received. Indication: Afib  Last office visit: 07/20/22 Kathlen Mody)  Scr: 0.66 (10/07/22)  Age: 72 Weight: 78.9kg  Appropriate dose. Refill sent.

## 2023-02-22 ENCOUNTER — Telehealth (INDEPENDENT_AMBULATORY_CARE_PROVIDER_SITE_OTHER): Payer: 59 | Admitting: Psychiatry

## 2023-02-22 ENCOUNTER — Encounter: Payer: Self-pay | Admitting: Psychiatry

## 2023-02-22 DIAGNOSIS — F3181 Bipolar II disorder: Secondary | ICD-10-CM | POA: Diagnosis not present

## 2023-02-22 DIAGNOSIS — F419 Anxiety disorder, unspecified: Secondary | ICD-10-CM | POA: Diagnosis not present

## 2023-02-22 DIAGNOSIS — F410 Panic disorder [episodic paroxysmal anxiety] without agoraphobia: Secondary | ICD-10-CM

## 2023-02-22 DIAGNOSIS — F5101 Primary insomnia: Secondary | ICD-10-CM

## 2023-02-22 MED ORDER — BUSPIRONE HCL 30 MG PO TABS: 30.0000 mg | ORAL_TABLET | Freq: Two times a day (BID) | ORAL | 0 refills | Status: DC

## 2023-02-22 MED ORDER — ARIPIPRAZOLE 2 MG PO TABS: 4.0000 mg | ORAL_TABLET | Freq: Every day | ORAL | 0 refills | Status: DC

## 2023-02-22 NOTE — Progress Notes (Signed)
Chelsea Benson [id number] 1951/02/17 72 y.o.  Virtual Visit via Video Note  I connected with pt @ on 02/22/23 at 10:00 AM EDT by a video enabled telemedicine application and verified that I am speaking with the correct person using two identifiers.   I discussed the limitations of evaluation and management by telemedicine and the availability of in person appointments. The patient expressed understanding and agreed to proceed.  I discussed the assessment and treatment plan with the patient. The patient was provided an opportunity to ask questions and all were answered. The patient agreed with the plan and demonstrated an understanding of the instructions.   The patient was advised to call back or seek an in-person evaluation if the symptoms worsen or if the condition fails to improve as anticipated.  I provided 16 minutes of non-face-to-face time during this encounter.  The patient was located at home.  The provider was located at [place].   Thayer Headings, PMHNP   Subjective:   Patient ID:  Chelsea Benson is a 72 y.o. (DOB 1951/08/10) female.  Chief Complaint:  Chief Complaint  Patient presents with   Follow-up    Anxiety, depression, insomnia    HPI Chelsea Benson presents for follow-up of anxiety, mood disturbance, and insomnia. She and her daughter have started talking again and reconciled their relationship. She has been able to see her grandchildren more. She reports that her mood has been "great, I feel great. I really do." Denies depressed mood. Anxiety has improved. She reports "a little bit of anxiety" in response to some recent medical concerns. She reports that she had a panic attack when she had extreme pain. Sleeping well. Appetite has been good. She reports eating 3 meals daily. Energy and motivation have been good. Enjoying family. Concentration is adequate. Denies SI.   Looking forward to a family meal next week.   Grandson moved out recently and  got his own place nearby. She is concerned about brother's drinking. She reports that she will stay in her room when he is drinking.   Past Psychiatric Medication Trials: Cymbalta-hair loss Mirtazapine Prozac Trazodone-ineffective Ambien Belsomra BuSpar Hydroxyzine Abilify Trileptal Xanax   Review of Systems:  Review of Systems  Gastrointestinal: Negative.   Musculoskeletal:  Negative for gait problem.       Improved muscle spasms. Has MRI scheduled.   Allergic/Immunologic: Positive for environmental allergies.  Neurological:  Negative for tremors and headaches.  Psychiatric/Behavioral:         Please refer to HPI    Medications: I have reviewed the patient's current medications.  Current Outpatient Medications  Medication Sig Dispense Refill   ACCU-CHEK GUIDE test strip TEST BID PRN  1   albuterol (PROVENTIL) (2.5 MG/3ML) 0.083% nebulizer solution USE 1 VIAL IN NEBULIZER EVERY 4 HOURS - and as needed 75 mL 3   albuterol (VENTOLIN HFA) 108 (90 Base) MCG/ACT inhaler USE 2 INHALATIONS BY MOUTH EVERY 4 HOURS AS NEEDED FOR WHEEZING  OR SHORTNESS OF BREATH 40.2 g 2   apixaban (ELIQUIS) 5 MG TABS tablet Take 1 tablet (5 mg total) by mouth 2 (two) times daily. 200 tablet 2   ARIPiprazole (ABILIFY) 2 MG tablet Take 2 tablets (4 mg total) by mouth daily. 180 tablet 0   azelastine (ASTELIN) 0.1 % nasal spray Place 2 sprays into both nostrils 2 (two) times daily. 90 mL 1   Bepotastine Besilate 1.5 % SOLN Place 1 drop into both eyes 2 (two) times daily. 30 mL  1   Blood Glucose Monitoring Suppl (ACCU-CHEK GUIDE) w/Device KIT See admin instructions.  1   budesonide-formoterol (SYMBICORT) 160-4.5 MCG/ACT inhaler INHALE 2 INHALATIONS BY MOUTH  INTO THE LUNGS IN THE MORNING  AND AT BEDTIME 10.2 g 11   busPIRone (BUSPAR) 30 MG tablet Take 1 tablet (30 mg total) by mouth 2 (two) times daily. 180 tablet 0   CALCIUM-VITAMIN D PO Take 1 tablet by mouth daily.     Carbinoxamine Maleate 4 MG TABS  Take 1 tablet (4 mg total) by mouth 3 (three) times daily as needed. 180 tablet 1   clonazePAM (KLONOPIN) 0.5 MG tablet Take 1/2-1 tab po TID prn anxiety. (Do not combine with Ambien) 270 tablet 1   desonide (DESOWEN) 0.05 % ointment APPLY TO AFFECTED AREA(S)  TOPICALLY TWICE DAILY AS  NEEDED 180 g 1   diltiazem (CARDIZEM CD) 300 MG 24 hr capsule Take 1 capsule (300 mg total) by mouth daily. 100 capsule 2   diltiazem (CARDIZEM) 60 MG tablet TAKE DILITAZEM 60 MG BY MOUTH AS NEEDED FOR PALPITATIONS. YOU CAN TAKE EVERY 6 HOURS AS NEEDED UP TO TWICE DAILY WITH EACH EPISODE 60 tablet 7   EPINEPHrine 0.3 mg/0.3 mL IJ SOAJ injection INJECT INTRAMUSCULARLY 1  PEN AS NEEDED FOR ALLERGIC  RESPONSE AS DIRECTED BY MD. SEEK MEDICAL HELP AFTER  USE. 2 each 0   famotidine (PEPCID) 20 MG tablet Take after breakfast and an hour before bedime 180 tablet 2   fexofenadine (ALLERGY RELIEF) 180 MG tablet TAKE 1 TABLET(180 MG) BY MOUTH DAILY 30 tablet 2   fluticasone (FLONASE) 50 MCG/ACT nasal spray USE 2 SPRAYS IN BOTH NOSTRILS  DAILY 48 g 1   fluticasone (FLOVENT HFA) 110 MCG/ACT inhaler Inhale 4 puffs twice a day with a spacer for the next 2 weeks or until cough and wheeze free 1 each 0   hydrochlorothiazide (HYDRODIURIL) 25 MG tablet TAKE 1 TABLET BY MOUTH DAILY 90 tablet 3   ipratropium (ATROVENT) 0.06 % nasal spray Place 2 sprays into both nostrils 4 (four) times daily as needed for rhinitis. 15 mL 5   lidocaine (LIDODERM) 5 % Place 1 patch onto the skin daily. Remove & Discard patch within 12 hours or as directed by MD 30 patch 0   meloxicam (MOBIC) 7.5 MG tablet Take 1 tablet (7.5 mg total) by mouth daily. 30 tablet 0   Mepolizumab (NUCALA) 100 MG/ML SOAJ Inject 1 mL (100 mg total) into the skin every 28 (twenty-eight) days. 1 mL 11   metFORMIN (GLUCOPHAGE-XR) 500 MG 24 hr tablet Take 500 mg by mouth in the morning and at bedtime.     methocarbamol (ROBAXIN) 500 MG tablet Take 500 mg by mouth as needed for muscle  spasms.     mirtazapine (REMERON) 30 MG tablet TAKE 1 TABLET BY MOUTH AT  BEDTIME 90 tablet 3   montelukast (SINGULAIR) 10 MG tablet TAKE 1 TABLET BY MOUTH AT  BEDTIME 100 tablet 2   Oxcarbazepine (TRILEPTAL) 300 MG tablet Take 2 tablets (600 mg total) by mouth at bedtime. 180 tablet 1   oxybutynin (DITROPAN-XL) 10 MG 24 hr tablet Take 10 mg by mouth daily.     potassium chloride SA (KLOR-CON M) 20 MEQ tablet TAKE 1 TABLET BY MOUTH DAILY 100 tablet 1   pravastatin (PRAVACHOL) 20 MG tablet Take 1 tablet (20 mg total) by mouth at bedtime. 90 tablet 3   pregabalin (LYRICA) 100 MG capsule Take 100 mg by mouth 3 (three)  times daily.     Respiratory Therapy Supplies (NEBULIZER) DEVI Use as directed with nebulizer solution. 1 each 1   Respiratory Therapy Supplies (NEBULIZER/TUBING/MOUTHPIECE) KIT Use as directed with nebulizer machine 1 kit 12   Semaglutide, 1 MG/DOSE, (OZEMPIC, 1 MG/DOSE,) 2 MG/1.5ML SOPN Take 1 mg by mouth once a week.     triamcinolone (NASACORT) 55 MCG/ACT AERO nasal inhaler Place 2 sprays into the nose daily. 16.9 mL 5   zolpidem (AMBIEN) 10 MG tablet Take 0.5-1 tablets (5-10 mg total) by mouth at bedtime as needed. for sleep 90 tablet 1   No current facility-administered medications for this visit.    Medication Side Effects: None  Allergies:  Allergies  Allergen Reactions   Ramipril Other (See Comments)   Cymbalta [Duloxetine Hcl] Other (See Comments)    Caused hair loss   Methylprednisolone Acetate Hives and Rash    05/29/20 developed hives "all over my back" after MBB; resolved with Benadryl and "a few days."  Can tolerate Dexamethasone/Decadron. 05/29/20 developed hives "all over my back" after MBB; resolved with Benadryl and "a few days."  Can tolerate Dexamethasone/Decadron.   Rosuvastatin Calcium Other (See Comments) and Cough    Rapid heart rate   Almond Meal Itching and Swelling    Tongue swells and itches   Almond Oil Itching and Swelling    Itching and  swelling of tongue    Carvedilol Cough   Fish Allergy Itching    Halibut fish   Losartan Potassium Cough   Morphine And Related Itching    Past Medical History:  Diagnosis Date   Allergic rhinoconjunctivitis    Anginal pain (HCC)    Anxiety    Arthritis    Spondylolysis, knee and ankle arthritis pains.   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  He uses a walker   Depression    Diabetes mellitus without complication ([place])    dx in 2007   Diabetes mellitus, type II ([place])    Dysrhythmia    Eczema    Headache(784.0)    sinus related    History of kidney stones    History of nephrolithiasis    Hx of pulmonary embolus 2017   Hyperlipidemia    Hypertension    Insomnia    Memory change    OSA on CPAP    PSG 05/24/2016: AHI 17/hour 02 mean 76%.  HSAT 06/13/17, AHI 11-hour 02 mean 79%   Paroxysmal atrial fibrillation (HCC) 02/24/2021   CHA2DS2-VASc score 4 (HTN, DM, female, age-73); on Eliquis   Pneumonia    Tremor of both hands     Family History  Problem Relation Age of Onset   Diabetes Sister    Diabetes Brother    Hypertension Brother    Cancer Maternal Grandmother    Heart failure Maternal Grandmother    Hypertension Paternal Grandmother    Asthma Daughter    Cancer Daughter    Depression Daughter    Hypertension Daughter    Heart failure Maternal Aunt    Pneumonia Mother    Diabetes Mother    Alcoholism Father    Alcohol abuse Father    Depression Daughter  Schizophrenia Grandchild    Allergies Neg Hx    Eczema Neg Hx    Immunodeficiency Neg Hx     Social History   Socioeconomic History   Marital status: Single    Spouse name: Not on file   Number of children: 2   Years of education:  Not on file   Highest education level: Some college, no degree  Occupational History    Comment: NA  Tobacco Use   Smoking status: Never    Passive exposure: Yes   Smokeless tobacco: Never   Tobacco comments:    grandson smokes, but in his car  Vaping Use   Vaping Use: Never used  Substance and Sexual Activity   Alcohol use: Yes    Comment: rare use   Drug use: No   Sexual activity: Not Currently  Other Topics Concern   Not on file  Social History Narrative   She is currently single.  Has 2 girls.  Has some college education.  Currently disabled due to chronic back pain and not employed.   07/01/19 lives with brother Shanon Brow   Caffeine, none   Social Determinants of Health   Financial Resource Strain: Not on file  Food Insecurity: Not on file  Transportation Needs: Not on file  Physical Activity: Not on file  Stress: Not on file  Social Connections: Not on file  Intimate Partner Violence: Not on file    Past Medical History, Surgical history, Social history, and Family history were reviewed and updated as appropriate.   Please see review of systems for further details on the patient's review from today.   Objective:   Physical Exam:  There were no vitals taken for this visit.  Physical Exam Constitutional:      General: She is not in acute distress. Musculoskeletal:        General: No deformity.  Neurological:     Mental Status: She is alert and oriented to person, place, and time.     Coordination: Coordination normal.  Psychiatric:        Attention and Perception: Attention and perception normal. She does not perceive auditory or visual hallucinations.        Mood and Affect: Mood normal. Mood is not anxious or depressed. Affect is not labile, blunt, angry or inappropriate.        Speech: Speech normal.        Behavior: Behavior normal.        Thought Content: Thought content normal. Thought content is not paranoid or delusional. Thought content does not  include homicidal or suicidal ideation. Thought content does not include homicidal or suicidal plan.        Cognition and Memory: Cognition and memory normal.        Judgment: Judgment normal.     Comments: Insight intact     Lab Review:     Component Value Date/Time   NA 140 02/11/2023 1625   NA 141 10/07/2022 1219   K 3.9 02/11/2023 1625   CL 104 02/11/2023 1625   CO2 25 02/11/2023 1625   GLUCOSE 89 02/11/2023 1625   BUN 16 02/11/2023 1625   BUN 14 10/07/2022 1219   CREATININE 0.66 02/11/2023 1625   CALCIUM 9.7 02/11/2023 1625   PROT 6.7 02/11/2023 1625   PROT 7.0 10/07/2022 1219   ALBUMIN 3.8 02/11/2023 1625   ALBUMIN 4.7 10/07/2022 1219   AST 18 02/11/2023 1625   ALT 16 02/11/2023 1625   ALKPHOS 60 02/11/2023 1625  BILITOT 0.3 02/11/2023 1625   BILITOT <0.2 10/07/2022 1219   GFRNONAA >60 02/11/2023 1625   GFRAA 108 07/06/2020 0834       Component Value Date/Time   WBC 8.5 02/11/2023 1625   RBC 4.19 02/11/2023 1625   HGB 10.3 (L) 02/11/2023 1625   HGB 12.5 11/08/2018 1041   HCT 34.0 (L) 02/11/2023 1625   HCT 39.5 11/08/2018 1041   PLT 368 02/11/2023 1625   MCV 81.1 02/11/2023 1625   MCV 81 11/08/2018 1041   MCH 24.6 (L) 02/11/2023 1625   MCHC 30.3 02/11/2023 1625   RDW 18.6 (H) 02/11/2023 1625   RDW 14.3 11/08/2018 1041   LYMPHSABS 1.3 02/11/2023 1625   LYMPHSABS 1.4 11/08/2018 1041   MONOABS 0.9 02/11/2023 1625   EOSABS 0.0 02/11/2023 1625   EOSABS 0.2 11/08/2018 1041   BASOSABS 0.0 02/11/2023 1625   BASOSABS 0.0 11/08/2018 1041    No results found for: "POCLITH", "LITHIUM"   No results found for: "PHENYTOIN", "PHENOBARB", "VALPROATE", "CBMZ"   .res Assessment: Plan:   Will continue current plan of care since target signs and symptoms are well controlled without any tolerability issues. Continue Abilify 4 mg po qd for mood s/s.  Continue Buspar 30 mg po BID for anxiety. Continue Klonopin 0.5 mg 1/2-1 tab prn panic. Continue Dayvigo 10 mg at  bedtime for insomnia.  Continue Remeron 30 mg po qhs for depression and insomnia.  Continue Trileptal 600 mg po QHS for mood stabilization and anxiety.  Continue Ambien 10 mg 1/2-1 tab po QHS prn insomnia.  Pt to follow-up in 2 months or sooner if clinically indicated.  Recommend continuing therapy with Rinaldo Cloud, LCSW.  Patient advised to contact office with any questions, adverse effects, or acute worsening in signs and symptoms.   Brytney was seen today for follow-up.  Diagnoses and all orders for this visit:  Bipolar II disorder ([place]) -     ARIPiprazole (ABILIFY) 2 MG tablet; Take 2 tablets (4 mg total) by mouth daily.  Anxiety disorder, unspecified type -     busPIRone (BUSPAR) 30 MG tablet; Take 1 tablet (30 mg total) by mouth 2 (two) times daily.  Primary insomnia  Panic disorder     Please see After Visit Summary for patient specific instructions.  Future Appointments  Date Time Provider [place]  02/28/2023  1:00 PM Shanon Ace, LCSW [facility] None  03/22/2023  8:00 AM Shanon Ace, LCSW [facility] None  04/12/2023  8:00 AM Shanon Ace, LCSW [facility] None  05/25/2023 11:00 AM Kennith Gain, MD [facility] None    No orders of the defined types were placed in this encounter.     -------------------------------

## 2023-02-28 ENCOUNTER — Ambulatory Visit (INDEPENDENT_AMBULATORY_CARE_PROVIDER_SITE_OTHER): Payer: 59 | Admitting: Psychiatry

## 2023-02-28 DIAGNOSIS — F3181 Bipolar II disorder: Secondary | ICD-10-CM | POA: Diagnosis not present

## 2023-02-28 NOTE — Progress Notes (Signed)
[facility] Counselor/Therapist Progress Note  Patient ID: Chelsea Benson, MRN: [id number],    Date: 02/28/2023  Time Spent: 50 minutes   Virtual Visit via Telehealth Note: [place] with patient by a telemedicine/telehealth application, with their informed consent, and verified patient privacy and that I am speaking with the correct person using two identifiers. I discussed the limitations, risks, security and privacy concerns of performing psychotherapy and the availability of in person appointments. I also discussed with the patient that there may be a patient responsible charge related to this service. The patient expressed understanding and agreed to proceed. I discussed the treatment planning with the patient. The patient was provided an opportunity to ask questions and all were answered. The patient agreed with the plan and demonstrated an understanding of the instructions. The patient was advised to call  our office if  symptoms worsen or feel they are in a crisis state and need immediate contact.   Therapist Location: office Patient Location: home   Treatment Type: Individual Therapy  Reported Symptoms: anxiety, worrying  Mental Status Exam:  Appearance:   Casual     Behavior:  Appropriate, Sharing, and Motivated  Motor:  Uses rollator to walk  Speech/Language:   Clear and Coherent  Affect:  anxious  Mood:  anxious  Thought process:  goal directed  Thought content:    Rumination  Sensory/Perceptual disturbances:    WNL  Orientation:  oriented to person, place, time/date, situation, day of week, month of year, year, and stated date of February 28, 2023  Attention:  Good  Concentration:  Good and Fair  Memory:  WNL  Fund of knowledge:   Good  Insight:    Good and Fair  Judgment:   Good  Impulse Control:  Good   Risk Assessment: Danger to Self:  No Self-injurious Behavior: No Danger to Others: No Duty to Warn:no Physical Aggression / Violence:No   Access to Firearms a concern: No  Gang Involvement:No   Subjective: Patient today reporting symptoms of anxiety and worrying, mostly related to personal and family/friend concerns. Did go to hospital ED recently due to recurring back pain. Has since seen her neurosurgeon and got meds which are helping. Frustrated as she's "not sure what her return of back pain may mean for her and hoping it won't mean another surgery." Venting her frustrations today in session freely and stated this has been helpful. Processed some concerns about family including continued issues with brother living with her.  Worked further today on some of her worrying especially about things in the past in the future.  Trying to let go of her "habit" of worrying so much, especially when realizing some of the things she worries about are totally out of her control, and feeds her anxiety.  Is grateful that she is now talking again with a family member that had created some distance for a while but is now back in more regular contact with patient.  Supported patient's ongoing efforts to have healthier boundaries with others within the family.  Interventions: Cognitive Behavioral Therapy and Ego-Supportive  Long term goal: (measurable) Reduce overall level, frequency, and intensity of the anxiety so that daily functioning is not impaired.  Patient will eventually report a rating of "4" or less" on 1-10 anxiety scale for a 62-month time period where her daily functioning is not impaired.  Short term goal: Increase understanding of beliefs and messages that produce the worry and anxiety. Strategy: Patient will explore  and work to stop cognitive messages that feed anxiety and work to replace them with more positive and empowering messages  Diagnosis:   ICD-10-CM   1. Bipolar II disorder ([place])  F31.81      Plan: Patient today showing good motivation and participation in session today as she worked on her anxiety and worrying, and  trying to reduce both of these behaviors especially in relationship to the family.  Notes that she has a long history of anxiety and worrying and that making changes is hard but she feels that she is making some progress.  Does need to continue with goal-directed behaviors to keep moving in a forward direction. Encouraged patient in her practice of more positive/self affirming behaviors as noted in session including: Remaining in contact with supportive people, decreasing her watching of disturbing news on TV as this feeds her anxiety, remain on her prescribed medications, trusting herself more and recognizing her strengths versus her limitations, setting limits with others who are unhealthy for her, getting outside on her porch as she is able which is one of her favorite places to be, healthy nutrition, stay in the present focusing on what she can change or control rather than getting stuck in the past, encouraging self talk, realize the strength she shows working with goal-directed behaviors to move in a direction that supports her improved emotional health and overall outlook.  Goal review and progress/challenges noted with patient.  Next appointment within 2 to 3 weeks.  This record has been created using [employer].  Chart creation errors have been sought, but may not always have been located and corrected.  Such creation errors do not reflect on the standard of medical care provided.   Shanon Ace, LCSW

## 2023-03-01 DIAGNOSIS — M5126 Other intervertebral disc displacement, lumbar region: Secondary | ICD-10-CM | POA: Diagnosis not present

## 2023-03-01 DIAGNOSIS — M5440 Lumbago with sciatica, unspecified side: Secondary | ICD-10-CM | POA: Diagnosis not present

## 2023-03-14 ENCOUNTER — Ambulatory Visit: Payer: 59 | Admitting: Psychiatry

## 2023-03-14 DIAGNOSIS — M47816 Spondylosis without myelopathy or radiculopathy, lumbar region: Secondary | ICD-10-CM | POA: Diagnosis not present

## 2023-03-15 ENCOUNTER — Other Ambulatory Visit: Payer: Self-pay | Admitting: Allergy

## 2023-03-20 DIAGNOSIS — M7062 Trochanteric bursitis, left hip: Secondary | ICD-10-CM | POA: Diagnosis not present

## 2023-03-22 ENCOUNTER — Ambulatory Visit (INDEPENDENT_AMBULATORY_CARE_PROVIDER_SITE_OTHER): Payer: 59 | Admitting: Psychiatry

## 2023-03-22 DIAGNOSIS — F3181 Bipolar II disorder: Secondary | ICD-10-CM | POA: Diagnosis not present

## 2023-03-22 NOTE — Progress Notes (Signed)
[facility] Counselor/Therapist Progress Note  Patient ID: Chelsea Benson, MRN: [id number],    Date: 03/22/2023  Time Spent: 50 minutes   Treatment Type: Individual Therapy  Virtual Visit via Telehealth Note: MyChartVideo session Connected with patient by a telemedicine/telehealth application, with their informed consent, and verified patient privacy and that I am speaking with the correct person using two identifiers. I discussed the limitations, risks, security and privacy concerns of performing psychotherapy and the availability of in person appointments. I also discussed with the patient that there may be a patient responsible charge related to this service. The patient expressed understanding and agreed to proceed. I discussed the treatment planning with the patient. The patient was provided an opportunity to ask questions and all were answered. The patient agreed with the plan and demonstrated an understanding of the instructions. The patient was advised to call  our office if  symptoms worsen or feel they are in a crisis state and need immediate contact.   Therapist Location: office Patient Location: home   Reported Symptoms: anxiety, "pissed off"  Mental Status Exam:  Appearance:   Casual and Neat     Behavior:  Appropriate, Sharing, and Motivated  Motor:  Uses cane when walking  Speech/Language:   Clear and Coherent  Affect:  Anxious, angry  Mood:  angry, anxious, and irritable  Thought process:  goal directed  Thought content:    WNL  Sensory/Perceptual disturbances:    WNL  Orientation:  oriented to person, place, time/date, situation, day of week, month of year, year, and stated date of March 22, 2023  Attention:  Good  Concentration:  Good  Memory:  WNL  Fund of knowledge:   Good  Insight:    Good and Fair  Judgment:   Good  Impulse Control:  Good and Fair   Risk Assessment: Danger to Self:  No Self-injurious Behavior: No Danger to Others: No Duty to  Warn:no Physical Aggression / Violence:No  Access to Firearms a concern: No  Gang Involvement:No   Subjective:   Patient today reporting anxiety and "I'm pissed off about the way she feels she has been treated re: ongoing back pain. Venting today seemed to help patient look at different sides of the issue some. Notes that more importantly she did get some helpful medication. Reports relationship with daughter that had been difficult, continues to be improved. 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 Discussed more concerns about brother living with her and how to have healthier limits/boundaries and being able to set more clear limits with him as needed.Continues working on "trying not to worry so much". Recognizes that she has some issues with control and is working "to let go" at times and be ok with not being able to control everything. Stated her worrying has decreased "a little bit. Healthier boundaries is a work in progress.   Interventions: Cognitive Behavioral Therapy and Ego-Supportive  Long term goal: (measurable) Reduce overall level, frequency, and intensity of the anxiety so that daily functioning is not impaired.  Patient will eventually report a rating of "4" or less" on 1-10 anxiety scale for a 110-month time period where her daily functioning is not impaired.  Short term goal: Increase understanding of beliefs and messages that produce the worry and anxiety. Strategy: Patient will explore and work to stop cognitive messages that feed anxiety and work to replace them with more positive and empowering messages   Diagnosis:   ICD-10-CM   1. Bipolar  II disorder  F31.81      Plan:  Patient today actively participating in session working further on her anxiety and some anger that had built up due to interactions with others that did not go well, within family and beyond.  Continues to have good motivation and participation in sessions working on her tendency  to worry which feeds her anxiety and she seems to be realizing this more and trying to follow through in goal-directed behaviors to help lessen both the anxiety and worry.  Has made some progress and needs to continue working with goal-directed behaviors in order to keep moving in a forward direction. Encouraged patient and practicing more positive/self affirming behaviors as noted in session including: Staying in contact with supportive people, decrease her watching of disturbing news on TV as this feeds her anxiety, stay on her prescribed medications, trusting herself more and recognizing her strengths versus limitations, setting limits with others who are unhealthy for her, getting outside on her porch as she is able which is one of her favorite places to be, healthy nutrition, stay in the present focusing on what she can change or control rather than remain stuck in the past, encouraging self talk, and realize the strengths she shows working with goal-directed behaviors to move in a direction that supports her improved emotional health and overall wellbeing.  Self rating scales: 1-10 depression scale-0/1 1-10 anxiety scale-4/5  Goal review and progress/challenges noted with patient.  Next appointment within 3 weeks.  This record has been created using [employer].  Chart creation errors have been sought, but may not always have been located and corrected.  Such creation errors do not reflect on the standard of medical care provided.   Mathis Fare, LCSW

## 2023-03-27 ENCOUNTER — Other Ambulatory Visit: Payer: Self-pay | Admitting: Psychiatry

## 2023-03-27 DIAGNOSIS — F419 Anxiety disorder, unspecified: Secondary | ICD-10-CM

## 2023-03-28 ENCOUNTER — Ambulatory Visit: Payer: 59 | Admitting: Psychiatry

## 2023-03-30 ENCOUNTER — Other Ambulatory Visit: Payer: Self-pay | Admitting: Psychiatry

## 2023-03-30 DIAGNOSIS — F419 Anxiety disorder, unspecified: Secondary | ICD-10-CM

## 2023-03-30 DIAGNOSIS — M47816 Spondylosis without myelopathy or radiculopathy, lumbar region: Secondary | ICD-10-CM | POA: Diagnosis not present

## 2023-03-30 DIAGNOSIS — M4326 Fusion of spine, lumbar region: Secondary | ICD-10-CM | POA: Diagnosis not present

## 2023-04-03 ENCOUNTER — Other Ambulatory Visit: Payer: Self-pay | Admitting: *Deleted

## 2023-04-03 DIAGNOSIS — M47816 Spondylosis without myelopathy or radiculopathy, lumbar region: Secondary | ICD-10-CM | POA: Diagnosis not present

## 2023-04-03 DIAGNOSIS — M4326 Fusion of spine, lumbar region: Secondary | ICD-10-CM | POA: Diagnosis not present

## 2023-04-03 MED ORDER — FLUTICASONE PROPIONATE 50 MCG/ACT NA SUSP: NASAL | 1 refills | Status: DC

## 2023-04-04 ENCOUNTER — Other Ambulatory Visit: Payer: Self-pay | Admitting: Cardiology

## 2023-04-05 DIAGNOSIS — M4326 Fusion of spine, lumbar region: Secondary | ICD-10-CM | POA: Diagnosis not present

## 2023-04-05 DIAGNOSIS — M47816 Spondylosis without myelopathy or radiculopathy, lumbar region: Secondary | ICD-10-CM | POA: Diagnosis not present

## 2023-04-10 DIAGNOSIS — Z9989 Dependence on other enabling machines and devices: Secondary | ICD-10-CM | POA: Diagnosis not present

## 2023-04-10 DIAGNOSIS — D509 Iron deficiency anemia, unspecified: Secondary | ICD-10-CM | POA: Diagnosis not present

## 2023-04-10 DIAGNOSIS — R5383 Other fatigue: Secondary | ICD-10-CM | POA: Diagnosis not present

## 2023-04-10 DIAGNOSIS — I10 Essential (primary) hypertension: Secondary | ICD-10-CM | POA: Diagnosis not present

## 2023-04-10 DIAGNOSIS — G4733 Obstructive sleep apnea (adult) (pediatric): Secondary | ICD-10-CM | POA: Diagnosis not present

## 2023-04-11 DIAGNOSIS — M4326 Fusion of spine, lumbar region: Secondary | ICD-10-CM | POA: Diagnosis not present

## 2023-04-11 DIAGNOSIS — M47816 Spondylosis without myelopathy or radiculopathy, lumbar region: Secondary | ICD-10-CM | POA: Diagnosis not present

## 2023-04-12 ENCOUNTER — Ambulatory Visit (INDEPENDENT_AMBULATORY_CARE_PROVIDER_SITE_OTHER): Payer: 59 | Admitting: Psychiatry

## 2023-04-12 DIAGNOSIS — F3181 Bipolar II disorder: Secondary | ICD-10-CM | POA: Diagnosis not present

## 2023-04-12 NOTE — Progress Notes (Addendum)
[facility] Counselor/Therapist Progress Note  Patient ID: Chelsea Benson, MRN: [id number],    Date: 04/12/2023  Time Spent: 48 minutes  Treatment Type: Individual Therapy  Virtual Visit via Telehealth Note; MyChart Video session Connected with patient by a telemedicine/telehealth application, with their informed consent, and verified patient privacy and that I am speaking with the correct person using two identifiers. I discussed the limitations, risks, security and privacy concerns of performing psychotherapy and the availability of in person appointments. I also discussed with the patient that there may be a patient responsible charge related to this service. The patient expressed understanding and agreed to proceed. I discussed the treatment planning with the patient. The patient was provided an opportunity to ask questions and all were answered. The patient agreed with the plan and demonstrated an understanding of the instructions. The patient was advised to call  our office if  symptoms worsen or feel they are in a crisis state and need immediate contact.   Therapist Location: office Patient Location: home  Reported Symptoms: anxiety "and some ups and downs depending on all the things I'm dealing with" (medical issues, family relationships, brother living back in the home with patient), fell a few times recently and has been in contact with her doctor  Mental Status Exam:  Appearance:   Casual     Behavior:  Appropriate, Sharing, and Motivated  Motor:  Uses rollater to walk about  Speech/Language:   Clear and Coherent  Affect:  Anxious, some depression noted but decreased  Mood:  anxious and some depression but decreased  Thought process:  goal directed  Thought content:    WNL  Sensory/Perceptual disturbances:    WNL  Orientation:  oriented to person, place, time/date, situation, day of week, month of year, year, and stated date of Apr 12, 2023  Attention:  Good   Concentration:  Good  Memory:  "Good, I take prevagen"  Fund of knowledge:   Good  Insight:    Good and Fair  Judgment:   Good  Impulse Control:  Good   Risk Assessment: Danger to Self:  No Self-injurious Behavior: No Danger to Others: No Duty to Warn:no Physical Aggression / Violence:No  Access to Firearms a concern: No  Gang Involvement:No   Subjective: Patient in session today reporting anxiety, decreased depression, ups and downs with medical issues and is in touch with her Dr.  Jerilynn Som having some issues with alcoholic brother that lives with her but feels that she is doing some better and it helps that older brother is trying to assist with the alcoholic brother that is living with her. Daughter who had some difficulties in relationship with patient a few weeks back, is still interacting more with patient now and on better terms. Still bothered by some back pain in "same area where I had surgery". Concerned about some balance issues that have led to patient falling a few times recently but was not hurt in the falls. Is to check back today with her PCP about some treatment/exercises for her balance issues. Needed session today to vent personal and family concerns and discuss ways of better managing family relationships and setting more effective personal boundaries. Also shared today her "worrying about her falling more" but does feel she is being more careful in trying to take precautions that can help her refrain from falling.  Is limited in positive contacts with others as some family relationships are very stressful for her.   Interventions: Cognitive  Behavioral Therapy and Ego-Supportive  Long term goal: (measurable) Reduce overall level, frequency, and intensity of the anxiety so that daily functioning is not impaired.  Patient will eventually report a rating of "4" or less" on 1-10 anxiety scale for a 45-month time period where her daily functioning is not impaired.  Short term  goal: Increase understanding of beliefs and messages that produce the worry and anxiety. Strategy: Patient will explore and work to stop cognitive messages that feed anxiety and work to replace them with more positive and empowering messages  Diagnosis:   ICD-10-CM   1. Bipolar II disorder (HCC)  F31.81      Plan: Patient today can change some of her prior work on difficult family relationships and setting boundaries with particular family members as she can.  Older brother seems to be helping out more with monitoring her alcoholic brother that is living with her, and that seems to be helping.  Patient's motivation and determination have both strength and some which is to her advantage.  Patient is showing progress and needs to continue her work with goal-directed behaviors to keep moving in a forward direction. Encouraged patient in her practice of more positive/self affirming behaviors as noted in session including: Staying in contact with supportive people, decrease her watching of disturbing news on TV as this feeds her anxiety, stay on her prescribed medications, recognizing more of her strengths versus limitations, setting limits with others who are unhealthy for her, getting outside on her porch as she is able which is one of her favorite places to be, healthy nutrition, stay in the present focusing on what she can change or control rather than remain stuck in the past, encouraging self talk, and recognize the strengths she shows working with goal-directed behaviors to move in a direction that supports her improved emotional health and outlook.  Goal review of progress/challenges noted with patient.  Next appointment within 3 weeks.   Mathis Fare, LCSW

## 2023-04-14 DIAGNOSIS — M47816 Spondylosis without myelopathy or radiculopathy, lumbar region: Secondary | ICD-10-CM | POA: Diagnosis not present

## 2023-04-14 DIAGNOSIS — M4326 Fusion of spine, lumbar region: Secondary | ICD-10-CM | POA: Diagnosis not present

## 2023-04-17 ENCOUNTER — Other Ambulatory Visit: Payer: Self-pay | Admitting: *Deleted

## 2023-04-17 ENCOUNTER — Telehealth: Payer: Self-pay | Admitting: Allergy

## 2023-04-17 MED ORDER — ALBUTEROL SULFATE (2.5 MG/3ML) 0.083% IN NEBU: INHALATION_SOLUTION | RESPIRATORY_TRACT | 1 refills | Status: DC

## 2023-04-17 NOTE — Telephone Encounter (Signed)
Patient called and stated needs a refill on nebulizer solution sent to [facility].

## 2023-04-17 NOTE — Telephone Encounter (Signed)
Refills of the Albuterol nebulizer solution have been sent in to [facility]. Called patient and informed, patient verbalized understanding.

## 2023-04-18 DIAGNOSIS — M4326 Fusion of spine, lumbar region: Secondary | ICD-10-CM | POA: Diagnosis not present

## 2023-04-18 DIAGNOSIS — M47816 Spondylosis without myelopathy or radiculopathy, lumbar region: Secondary | ICD-10-CM | POA: Diagnosis not present

## 2023-04-19 DIAGNOSIS — M4326 Fusion of spine, lumbar region: Secondary | ICD-10-CM | POA: Diagnosis not present

## 2023-04-19 DIAGNOSIS — M47816 Spondylosis without myelopathy or radiculopathy, lumbar region: Secondary | ICD-10-CM | POA: Diagnosis not present

## 2023-04-20 DIAGNOSIS — D649 Anemia, unspecified: Secondary | ICD-10-CM | POA: Diagnosis not present

## 2023-04-20 NOTE — Addendum Note (Signed)
Addended byMathis Fare on: 04/20/2023 06:01 PM   Modules accepted: Level of Service

## 2023-04-24 ENCOUNTER — Telehealth (INDEPENDENT_AMBULATORY_CARE_PROVIDER_SITE_OTHER): Payer: 59 | Admitting: Psychiatry

## 2023-04-24 ENCOUNTER — Encounter: Payer: Self-pay | Admitting: Psychiatry

## 2023-04-24 DIAGNOSIS — F3181 Bipolar II disorder: Secondary | ICD-10-CM | POA: Diagnosis not present

## 2023-04-24 DIAGNOSIS — F5101 Primary insomnia: Secondary | ICD-10-CM | POA: Diagnosis not present

## 2023-04-24 DIAGNOSIS — F419 Anxiety disorder, unspecified: Secondary | ICD-10-CM

## 2023-04-24 DIAGNOSIS — G4733 Obstructive sleep apnea (adult) (pediatric): Secondary | ICD-10-CM | POA: Diagnosis not present

## 2023-04-24 MED ORDER — BUSPIRONE HCL 30 MG PO TABS: 30.0000 mg | ORAL_TABLET | Freq: Two times a day (BID) | ORAL | 0 refills | Status: DC

## 2023-04-24 MED ORDER — ARIPIPRAZOLE 2 MG PO TABS: 4.0000 mg | ORAL_TABLET | Freq: Every day | ORAL | 1 refills | Status: DC

## 2023-04-24 NOTE — Progress Notes (Signed)
Chelsea Benson [id number] 02-26-1951 72 y.o.  Virtual Visit via Video Note  I connected with pt @ on 04/24/23 at  9:00 AM EDT by a video enabled telemedicine application and verified that I am speaking with the correct person using two identifiers.   I discussed the limitations of evaluation and management by telemedicine and the availability of in person appointments. The patient expressed understanding and agreed to proceed.  I discussed the assessment and treatment plan with the patient. The patient was provided an opportunity to ask questions and all were answered. The patient agreed with the plan and demonstrated an understanding of the instructions.   The patient was advised to call back or seek an in-person evaluation if the symptoms worsen or if the condition fails to improve as anticipated.  I provided 23 minutes of non-face-to-face time during this encounter.  The patient was located at home.  The provider was located at [facility].   Corie Chiquito, PMHNP   Subjective:   Patient ID:  Chelsea Benson is a 72 y.o. (DOB October 15, 1951) female.  Chief Complaint:  Chief Complaint  Patient presents with   Follow-up    Anxiety, depression, and insomnia    HPI [name] presents for follow-up of anxiety, depression, and insomnia.   She reports that she is having acute back pain and spasms. She reports that her sleep was interrupted last night due to back pain. Denies depressed mood and reports that her mood is "good." She reports that "most of the time, it's good" in regards to anxiety. Denies panic attacks. She reports some anxiety in response to trying to arrange her schedule. Energy and motivation have been ok. Concentration has been good. Denies impulsive or risky behavior. Appetite has been good. Denies SI.   She is wanting to complete school. She is planning to go to [place] for a month. She is trying complete medical apts before her trip.   She has a  significant other that they have been off and on for 50 years.   She reports that her brother's ETOH use has improved and this has helped her anxiety. Mother died at 4 yo and Gihanna was 21 yo. Reports that brother went to live with their father. Brother is 85 yo.   Oldest daughter recently got a divorce after 10 year marriage.   She has one semester of school left.   Ambien last filled 02/06/23 for a 90-day supply. Klonopin last filled 01/23/23 for 90-day supply.  Past Psychiatric Medication Trials: Cymbalta-hair loss Mirtazapine Prozac Trazodone-ineffective Ambien Belsomra BuSpar Hydroxyzine Abilify Trileptal Xanax   Review of Systems:  Review of Systems  Musculoskeletal:  Positive for back pain.  Neurological:  Negative for tremors.  Psychiatric/Behavioral:         Please refer to HPI    Medications: I have reviewed the patient's current medications.  Current Outpatient Medications  Medication Sig Dispense Refill   ACCU-CHEK GUIDE test strip TEST BID PRN  1   albuterol (PROVENTIL) (2.5 MG/3ML) 0.083% nebulizer solution USE 1 VIAL IN NEBULIZER EVERY 4 HOURS - and as needed 225 mL 1   albuterol (VENTOLIN HFA) 108 (90 Base) MCG/ACT inhaler USE 2 INHALATIONS BY MOUTH EVERY 4 HOURS AS NEEDED FOR WHEEZING  OR SHORTNESS OF BREATH 40.2 g 2   apixaban (ELIQUIS) 5 MG TABS tablet Take 1 tablet (5 mg total) by mouth 2 (two) times daily. 200 tablet 2   ARIPiprazole (ABILIFY) 2 MG tablet Take 2 tablets (4  mg total) by mouth daily. 180 tablet 1   azelastine (ASTELIN) 0.1 % nasal spray Place 2 sprays into both nostrils 2 (two) times daily. 90 mL 1   Bepotastine Besilate 1.5 % SOLN Place 1 drop into both eyes 2 (two) times daily. 30 mL 1   Blood Glucose Monitoring Suppl (ACCU-CHEK GUIDE) w/Device KIT See admin instructions.  1   budesonide-formoterol (SYMBICORT) 160-4.5 MCG/ACT inhaler INHALE 2 INHALATIONS BY MOUTH  INTO THE LUNGS IN THE MORNING  AND AT BEDTIME 10.2 g 11   busPIRone  (BUSPAR) 30 MG tablet Take 1 tablet (30 mg total) by mouth 2 (two) times daily. 180 tablet 0   CALCIUM-VITAMIN D PO Take 1 tablet by mouth daily.     Carbinoxamine Maleate 4 MG TABS Take 1 tablet (4 mg total) by mouth 3 (three) times daily as needed. 180 tablet 1   clonazePAM (KLONOPIN) 0.5 MG tablet Take 1/2-1 tab po TID prn anxiety. (Do not combine with Ambien) 270 tablet 1   desonide (DESOWEN) 0.05 % ointment APPLY TO AFFECTED AREA(S)  TOPICALLY TWICE DAILY AS  NEEDED 180 g 1   diltiazem (CARDIZEM CD) 300 MG 24 hr capsule Take 1 capsule (300 mg total) by mouth daily. 100 capsule 2   diltiazem (CARDIZEM) 60 MG tablet TAKE 1 TABLET BY MOUTH EVERY 6 HOURS AS NEEDED FOR PALPITATION EDISODE. UP TO 2 TABLETS PER DAY 60 tablet 7   EPINEPHrine 0.3 mg/0.3 mL IJ SOAJ injection INJECT INTRAMUSCULARLY 1  PEN AS NEEDED FOR ALLERGIC  RESPONSE AS DIRECTED BY MD. SEEK MEDICAL HELP AFTER  USE. 2 each 0   famotidine (PEPCID) 20 MG tablet TAKE 1 TABLET BY MOUTH DAILY  AFTER SUPPER 100 tablet 2   fexofenadine (ALLERGY RELIEF) 180 MG tablet TAKE 1 TABLET(180 MG) BY MOUTH DAILY 30 tablet 2   fluticasone (FLONASE) 50 MCG/ACT nasal spray USE 2 SPRAYS IN BOTH NOSTRILS  DAILY 48 g 1   fluticasone (FLOVENT HFA) 110 MCG/ACT inhaler Inhale 4 puffs twice a day with a spacer for the next 2 weeks or until cough and wheeze free 1 each 0   hydrochlorothiazide (HYDRODIURIL) 25 MG tablet TAKE 1 TABLET BY MOUTH DAILY 90 tablet 3   ipratropium (ATROVENT) 0.06 % nasal spray Place 2 sprays into both nostrils 4 (four) times daily as needed for rhinitis. 15 mL 5   lidocaine (LIDODERM) 5 % Place 1 patch onto the skin daily. Remove & Discard patch within 12 hours or as directed by MD 30 patch 0   meloxicam (MOBIC) 7.5 MG tablet Take 1 tablet (7.5 mg total) by mouth daily. 30 tablet 0   Mepolizumab (NUCALA) 100 MG/ML SOAJ Inject 1 mL (100 mg total) into the skin every 28 (twenty-eight) days. 1 mL 11   metFORMIN (GLUCOPHAGE-XR) 500 MG 24 hr  tablet Take 500 mg by mouth in the morning and at bedtime.     methocarbamol (ROBAXIN) 500 MG tablet Take 500 mg by mouth as needed for muscle spasms.     mirtazapine (REMERON) 30 MG tablet TAKE 1 TABLET BY MOUTH AT  BEDTIME 90 tablet 3   montelukast (SINGULAIR) 10 MG tablet TAKE 1 TABLET BY MOUTH AT  BEDTIME 100 tablet 2   Oxcarbazepine (TRILEPTAL) 300 MG tablet TAKE 2 TABLETS BY MOUTH AT  BEDTIME 200 tablet 1   oxybutynin (DITROPAN-XL) 10 MG 24 hr tablet Take 10 mg by mouth daily.     potassium chloride SA (KLOR-CON M) 20 MEQ tablet TAKE 1  TABLET BY MOUTH DAILY 100 tablet 1   pravastatin (PRAVACHOL) 20 MG tablet Take 1 tablet (20 mg total) by mouth at bedtime. 90 tablet 3   pregabalin (LYRICA) 100 MG capsule Take 100 mg by mouth 3 (three) times daily.     Respiratory Therapy Supplies (NEBULIZER) DEVI Use as directed with nebulizer solution. 1 each 1   Respiratory Therapy Supplies (NEBULIZER/TUBING/MOUTHPIECE) KIT Use as directed with nebulizer machine 1 kit 12   Semaglutide, 1 MG/DOSE, (OZEMPIC, 1 MG/DOSE,) 2 MG/1.5ML SOPN Take 1 mg by mouth once a week.     triamcinolone (NASACORT) 55 MCG/ACT AERO nasal inhaler Place 2 sprays into the nose daily. 16.9 mL 5   zolpidem (AMBIEN) 10 MG tablet Take 0.5-1 tablets (5-10 mg total) by mouth at bedtime as needed. for sleep 90 tablet 1   No current facility-administered medications for this visit.    Medication Side Effects: None  Allergies:  Allergies  Allergen Reactions   Ramipril Other (See Comments)   Cymbalta [Duloxetine Hcl] Other (See Comments)    Caused hair loss   Methylprednisolone Acetate Hives and Rash    05/29/20 developed hives "all over my back" after MBB; resolved with Benadryl and "a few days."  Can tolerate Dexamethasone/Decadron. 05/29/20 developed hives "all over my back" after MBB; resolved with Benadryl and "a few days."  Can tolerate Dexamethasone/Decadron.   Rosuvastatin Calcium Other (See Comments) and Cough    Rapid  heart rate   Almond Meal Itching and Swelling    Tongue swells and itches   Almond Oil Itching and Swelling    Itching and swelling of tongue    Carvedilol Cough   Fish Allergy Itching    Halibut fish   Losartan Potassium Cough   Morphine And Codeine Itching    Past Medical History:  Diagnosis Date   Allergic rhinoconjunctivitis    Anginal pain (HCC)    Anxiety    Arthritis    Spondylolysis, knee and ankle arthritis pains.   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  He uses a walker   Depression    Diabetes mellitus without complication (HCC)    dx in 2007   Diabetes mellitus, type II (HCC)    Dysrhythmia    Eczema    Headache(784.0)    sinus related    History of kidney stones    History of nephrolithiasis    Hx of pulmonary embolus 2017   Hyperlipidemia    Hypertension    Insomnia    Memory change    OSA on CPAP    PSG 05/24/2016: AHI 17/hour 02 mean 76%.  HSAT 06/13/17, AHI 11-hour 02 mean 79%   Paroxysmal atrial fibrillation (HCC) 02/24/2021   CHA2DS2-VASc score 4 (HTN, DM, female, age-72); on Eliquis   Pneumonia    Tremor of both hands     Family History  Problem Relation Age of Onset   Diabetes Sister    Diabetes Brother    Hypertension Brother    Cancer Maternal Grandmother    Heart failure Maternal Grandmother    Hypertension Paternal Grandmother    Asthma Daughter    Cancer Daughter    Depression Daughter  Hypertension Daughter    Heart failure Maternal Aunt    Pneumonia Mother    Diabetes Mother    Alcoholism Father    Alcohol abuse Father    Depression Daughter    Schizophrenia Grandchild    Allergies Neg Hx    Eczema Neg Hx    Immunodeficiency Neg Hx     Social History    Socioeconomic History   Marital status: Single    Spouse name: Not on file   Number of children: 2   Years of education: Not on file   Highest education level: Some college, no degree  Occupational History    Comment: NA  Tobacco Use   Smoking status: Never    Passive exposure: Yes   Smokeless tobacco: Never   Tobacco comments:    grandson smokes, but in his car  Vaping Use   Vaping Use: Never used  Substance and Sexual Activity   Alcohol use: Yes    Comment: rare use   Drug use: No   Sexual activity: Not Currently  Other Topics Concern   Not on file  Social History Narrative   She is currently single.  Has 2 girls.  Has some college education.  Currently disabled due to chronic back pain and not employed.   07/01/19 lives with brother Onalee Hua   Caffeine, none   Social Determinants of Health   Financial Resource Strain: Not on file  Food Insecurity: Not on file  Transportation Needs: Not on file  Physical Activity: Not on file  Stress: Not on file  Social Connections: Not on file  Intimate Partner Violence: Not on file    Past Medical History, Surgical history, Social history, and Family history were reviewed and updated as appropriate.   Please see review of systems for further details on the patient's review from today.   Objective:   Physical Exam:  There were no vitals taken for this visit.  Physical Exam Constitutional:      General: She is not in acute distress. Musculoskeletal:        General: No deformity.  Neurological:     Mental Status: She is alert and oriented to person, place, and time.     Coordination: Coordination normal.  Psychiatric:        Attention and Perception: Attention and perception normal. She does not perceive auditory or visual hallucinations.        Mood and Affect: Mood is not depressed. Affect is not labile, blunt, angry or inappropriate.        Speech: Speech normal.        Behavior: Behavior normal.        Thought  Content: Thought content normal. Thought content is not paranoid or delusional. Thought content does not include homicidal or suicidal ideation. Thought content does not include homicidal or suicidal plan.        Cognition and Memory: Cognition and memory normal.        Judgment: Judgment normal.     Comments: Insight intact Mood is anxious in response to making plans.     Lab Review:     Component Value Date/Time   NA 140 02/11/2023 1625   NA 141 10/07/2022 1219   K 3.9 02/11/2023 1625   CL 104 02/11/2023 1625   CO2 25 02/11/2023 1625   GLUCOSE 89 02/11/2023 1625   BUN 16 02/11/2023 1625   BUN 14 10/07/2022 1219   CREATININE 0.66 02/11/2023 1625   CALCIUM 9.7 02/11/2023 1625  PROT 6.7 02/11/2023 1625   PROT 7.0 10/07/2022 1219   ALBUMIN 3.8 02/11/2023 1625   ALBUMIN 4.7 10/07/2022 1219   AST 18 02/11/2023 1625   ALT 16 02/11/2023 1625   ALKPHOS 60 02/11/2023 1625   BILITOT 0.3 02/11/2023 1625   BILITOT <0.2 10/07/2022 1219   GFRNONAA >60 02/11/2023 1625   GFRAA 108 07/06/2020 0834       Component Value Date/Time   WBC 8.5 02/11/2023 1625   RBC 4.19 02/11/2023 1625   HGB 10.3 (L) 02/11/2023 1625   HGB 12.5 11/08/2018 1041   HCT 34.0 (L) 02/11/2023 1625   HCT 39.5 11/08/2018 1041   PLT 368 02/11/2023 1625   MCV 81.1 02/11/2023 1625   MCV 81 11/08/2018 1041   MCH 24.6 (L) 02/11/2023 1625   MCHC 30.3 02/11/2023 1625   RDW 18.6 (H) 02/11/2023 1625   RDW 14.3 11/08/2018 1041   LYMPHSABS 1.3 02/11/2023 1625   LYMPHSABS 1.4 11/08/2018 1041   MONOABS 0.9 02/11/2023 1625   EOSABS 0.0 02/11/2023 1625   EOSABS 0.2 11/08/2018 1041   BASOSABS 0.0 02/11/2023 1625   BASOSABS 0.0 11/08/2018 1041    No results found for: "POCLITH", "LITHIUM"   No results found for: "PHENYTOIN", "PHENOBARB", "VALPROATE", "CBMZ"   .res Assessment: Plan:    Pt reports some recent mild anxiety in the context of several family stressors. Discussed the effect that these stressors have had  on her anxiety. Pt reports that overall her mood and anxiety symptoms are well controlled. Will continue current plan of care since target signs and symptoms are well controlled without any tolerability issues. Continue Remeron 30 mg po qhs for depression and insomnia.  Continue Abilify 4 mg po qd for mood s/s.  Continue Buspar 30 mg po BID for anxiety. Continue Trileptal 600 mg po QHS for mood stabilization and anxiety.  Continue Klonopin 0.5 mg 1/2-1 tab prn panic. Continue Dayvigo 10 mg at bedtime for insomnia.  Continue Ambien 10 mg 1/2-1 tab po QHS prn insomnia.  Recommend continuing therapy with Rockne Menghini, LCSW.   Pt to follow-up in 3 months or sooner if clinically indicated.  Patient advised to contact office with any questions, adverse effects, or acute worsening in signs and symptoms.   I spent 30 minutes dedicated to the care of this patient on the date of this  encounter to include pre-visit review of records, face-to-face time with the patient discussing recent stressors, ordering of medication, and post visit documentation.   Musetta was seen today for follow-up.  Diagnoses and all orders for this visit:  Bipolar II disorder (HCC) -     ARIPiprazole (ABILIFY) 2 MG tablet; Take 2 tablets (4 mg total) by mouth daily.  Anxiety disorder, unspecified type -     busPIRone (BUSPAR) 30 MG tablet; Take 1 tablet (30 mg total) by mouth 2 (two) times daily.  Primary insomnia     Please see After Visit Summary for patient specific instructions.  Future Appointments  Date Time Provider Department Center  05/04/2023  8:00 AM Mathis Fare, LCSW [facility] None  05/25/2023 11:00 AM Padgett, Pilar Grammes, MD [facility] None    No orders of the defined types were placed in this encounter.     -------------------------------

## 2023-04-26 DIAGNOSIS — M4326 Fusion of spine, lumbar region: Secondary | ICD-10-CM | POA: Diagnosis not present

## 2023-04-26 DIAGNOSIS — M47816 Spondylosis without myelopathy or radiculopathy, lumbar region: Secondary | ICD-10-CM | POA: Diagnosis not present

## 2023-04-27 DIAGNOSIS — M47816 Spondylosis without myelopathy or radiculopathy, lumbar region: Secondary | ICD-10-CM | POA: Diagnosis not present

## 2023-04-27 DIAGNOSIS — M4326 Fusion of spine, lumbar region: Secondary | ICD-10-CM | POA: Diagnosis not present

## 2023-05-02 DIAGNOSIS — M4326 Fusion of spine, lumbar region: Secondary | ICD-10-CM | POA: Diagnosis not present

## 2023-05-02 DIAGNOSIS — M47816 Spondylosis without myelopathy or radiculopathy, lumbar region: Secondary | ICD-10-CM | POA: Diagnosis not present

## 2023-05-04 ENCOUNTER — Ambulatory Visit (INDEPENDENT_AMBULATORY_CARE_PROVIDER_SITE_OTHER): Payer: 59 | Admitting: Psychiatry

## 2023-05-04 DIAGNOSIS — F419 Anxiety disorder, unspecified: Secondary | ICD-10-CM

## 2023-05-04 DIAGNOSIS — M47816 Spondylosis without myelopathy or radiculopathy, lumbar region: Secondary | ICD-10-CM | POA: Diagnosis not present

## 2023-05-04 DIAGNOSIS — M4326 Fusion of spine, lumbar region: Secondary | ICD-10-CM | POA: Diagnosis not present

## 2023-05-04 NOTE — Progress Notes (Signed)
[facility] Counselor/Therapist Progress Note  Patient ID: Chelsea Benson, MRN: [id number],    Date: 05/04/2023  Time Spent:  45 minutes  Treatment Type: Individual Therapy  Virtual Visit via Telehealth Note: MyChart Video Connected with patient by a telemedicine/telehealth application, with their informed consent, and verified patient privacy and that I am speaking with the correct person using two identifiers. I discussed the limitations, risks, security and privacy concerns of performing psychotherapy and the availability of in person appointments. I also discussed with the patient that there may be a patient responsible charge related to this service. The patient expressed understanding and agreed to proceed. I discussed the treatment planning with the patient. The patient was provided an opportunity to ask questions and all were answered. The patient agreed with the plan and demonstrated an understanding of the instructions. The patient was advised to call  our office if  symptoms worsen or feel they are in a crisis state and need immediate contact.   Therapist Location: office Patient Location: home   Reported Symptoms:  anxiety, "no depression", has fallen again since last appt and "bruised but not hurt badly" and did contact her Dr.   Mental Status Exam:  Appearance:   Casual     Behavior:  Appropriate, Sharing, and Motivated  Motor:  Uses rollater or cane in moving about  Speech/Language:   Clear and Coherent  Affect:  anxious  Mood:  anxious  Thought process:  goal directed  Thought content:    WNL  Sensory/Perceptual disturbances:    WNL  Orientation:  oriented to person, place, time/date, situation, day of week, month of year, year, and stated date of May 04, 2023  Attention:  Good  Concentration:  Good  Memory:  WNL  Fund of knowledge:   Good  Insight:    Good  Judgment:   Good  Impulse Control:  Good   Risk Assessment: Danger to Self:  No Self-injurious  Behavior: No Danger to Others: No Duty to Warn:no Physical Aggression / Violence:No  Access to Firearms a concern: No  Gang Involvement:No   Subjective:  Patient participating well in session today as she reports anxiety as main symptom related to family relationships, recent health issues including another fall but not hurt other than bruising, frustrations in some of her health issues. Alcoholic brother has stopped drinking again and hoping he will "stay stopped but refused to participate in AA". Daughter with whom there has been distance and avoidance, that relationship is better at this point, with 1-2 times of contact between them each week. Friend in [place], she is back in contact with "and that is a good friendship." Sleeping good. Staying in good contact with friends outside the family. Continues to experience back pain and is going to P-T twice weekly at [facility]. Balance issues still a problem at times and patient has communicated with her Dr and patient following through with exercises at home also to help. Continues working on healthier boundaries with others, as well as improved communication within her family.  Not worry as much about falling and is "I am being more careful".  Feels that her PT is helping also with this and her overall stability and an emotional outlook.  Interventions: Cognitive Behavioral Therapy and Ego-Supportive  Long term goal: (measurable) Reduce overall level, frequency, and intensity of the anxiety so that daily functioning is not impaired.  Patient will eventually report a rating of "4" or less" on 1-10 anxiety scale  for a 38-month time period where her daily functioning is not impaired.  Short term goal: Increase understanding of beliefs and messages that produce the worry and anxiety. Strategy: Patient will explore and work to stop cognitive messages that feed anxiety and work to replace them with more positive and empowering messages  Diagnosis:    ICD-10-CM   1. Anxiety disorder, unspecified type  F41.9      Plan:   Patient today continues her work on better managing boundaries and difficult situations within a family relationship that continues to be challenging for her but has improved some.  Her mood is some better today and she has her next PT appointment later this morning and seems to like the place where she attends.  Grateful that brother has stopped drinking and hoping that we will continue. Patient making progress and needs to continue working with her goal-directed behaviors that help her move in a forward direction. Encouraged patient in her practice of more positive/self affirming behaviors as noted in session including: Remain in contact with supportive people, decrease her watching of disturbing news on TV as this feeds anxiety, remain on her prescribed medications, recognize more of her strengths versus limitations, setting limits with others who are unhealthy for her, getting outside on the porch as she is able which is one of her favorite places she enjoys, healthy nutrition, stay in the present focusing on what she can change or control, refrain from getting stuck in the past encouraging self talk, and realize the strength she shows working with goal-directed behaviors to move in a direction that supports her improved emotional health and overall wellbeing.  Self rating scales: 1-10 depression scale-1 1-10 anxiety scale-6 1-10 self-esteem scale-7 1-10 motivation scale-7 1-10 hopefulness scale-7  Goal review and progress/challenges noted with patient.  Next appointment within 3 to 4 weeks.   Mathis Fare, LCSW

## 2023-05-09 DIAGNOSIS — M47816 Spondylosis without myelopathy or radiculopathy, lumbar region: Secondary | ICD-10-CM | POA: Diagnosis not present

## 2023-05-09 DIAGNOSIS — M4326 Fusion of spine, lumbar region: Secondary | ICD-10-CM | POA: Diagnosis not present

## 2023-05-12 DIAGNOSIS — M4326 Fusion of spine, lumbar region: Secondary | ICD-10-CM | POA: Diagnosis not present

## 2023-05-12 DIAGNOSIS — M47816 Spondylosis without myelopathy or radiculopathy, lumbar region: Secondary | ICD-10-CM | POA: Diagnosis not present

## 2023-05-16 DIAGNOSIS — M4326 Fusion of spine, lumbar region: Secondary | ICD-10-CM | POA: Diagnosis not present

## 2023-05-16 DIAGNOSIS — M47816 Spondylosis without myelopathy or radiculopathy, lumbar region: Secondary | ICD-10-CM | POA: Diagnosis not present

## 2023-05-17 DIAGNOSIS — M4326 Fusion of spine, lumbar region: Secondary | ICD-10-CM | POA: Diagnosis not present

## 2023-05-17 DIAGNOSIS — M47816 Spondylosis without myelopathy or radiculopathy, lumbar region: Secondary | ICD-10-CM | POA: Diagnosis not present

## 2023-05-18 DIAGNOSIS — M8588 Other specified disorders of bone density and structure, other site: Secondary | ICD-10-CM | POA: Diagnosis not present

## 2023-05-18 DIAGNOSIS — Z7952 Long term (current) use of systemic steroids: Secondary | ICD-10-CM | POA: Diagnosis not present

## 2023-05-22 DIAGNOSIS — R3121 Asymptomatic microscopic hematuria: Secondary | ICD-10-CM | POA: Diagnosis not present

## 2023-05-22 DIAGNOSIS — R1084 Generalized abdominal pain: Secondary | ICD-10-CM | POA: Diagnosis not present

## 2023-05-23 DIAGNOSIS — M47816 Spondylosis without myelopathy or radiculopathy, lumbar region: Secondary | ICD-10-CM | POA: Diagnosis not present

## 2023-05-23 DIAGNOSIS — M4326 Fusion of spine, lumbar region: Secondary | ICD-10-CM | POA: Diagnosis not present

## 2023-05-24 ENCOUNTER — Ambulatory Visit (INDEPENDENT_AMBULATORY_CARE_PROVIDER_SITE_OTHER): Payer: 59 | Admitting: Psychiatry

## 2023-05-24 DIAGNOSIS — F3181 Bipolar II disorder: Secondary | ICD-10-CM | POA: Diagnosis not present

## 2023-05-24 NOTE — Progress Notes (Addendum)
[facility] Counselor/Therapist Progress Note  Patient ID: Chelsea Benson, MRN: [id number],    Date: 05/24/2023  Time Spent: 50 minutes   Treatment Type: Individual Therapy  Virtual Visit via Telehealth Note: MyChart Video session Connected with patient by a telemedicine/telehealth application, with their informed consent, and verified patient privacy and that I am speaking with the correct person using two identifiers. I discussed the limitations, risks, security and privacy concerns of performing psychotherapy and the availability of in person appointments. I also discussed with the patient that there may be a patient responsible charge related to this service. The patient expressed understanding and agreed to proceed. I discussed the treatment planning with the patient. The patient was provided an opportunity to ask questions and all were answered. The patient agreed with the plan and demonstrated an understanding of the instructions. The patient was advised to call  our office if  symptoms worsen or feel they are in a crisis state and need immediate contact.   Therapist Location: office Patient Location: home  Reported Symptoms: anxiety, depression decreased. Symptoms mostly related to her health issues and family/alcoholic brother.  Mental Status Exam:  Appearance:   Casual     Behavior:  Appropriate, Sharing, and Motivated  Motor:  Normal and getting along better since her P-T  Speech/Language:   Clear and Coherent  Affect:  anxious  Mood:  anxious  Thought process:  goal directed  Thought content:    WNL  Sensory/Perceptual disturbances:    WNL  Orientation:  oriented to person, place, time/date, situation, day of week, month of year, year, and stated date of May 24, 2023  Attention:  Good  Concentration:  Good  Memory:  Some short term memory issues and reports Dr. Is aware  Fund of knowledge:   Good  Insight:    Good  Judgment:   Good  Impulse Control:  Good    Risk Assessment: Danger to Self:  No Self-injurious Behavior: No Danger to Others: No Duty to Warn:no Physical Aggression / Violence:No  Access to Firearms a concern: No  Gang Involvement:No   Subjective:  Patient today reporting anxiety, depression remains decreased, some "personally" and with my back problems. Continued her involvement with PT and recently graduated from there and is doing better. Reports no back pain currently and was given exercises to follow up on at home. Alcoholic brother living with her, he had stopped drinking but relapsed and is now back not drinking. Older brother still supportive of patient and other brother. Relationship with daughter that had been quite stressful, continues to improve. Balance "a little better and is still working on the exercises they gave her at PT. Fell 1 other time but not hurt. Worked further today in session on goals related to stopping cognitive messages that feed her anxiety and assist patient in replacing those messages with more positive messages. Noted some repeating herself in session today.  Interventions: Cognitive Behavioral Therapy and Ego-Supportive  Long term goal: (measurable) Reduce overall level, frequency, and intensity of the anxiety so that daily functioning is not impaired.  Patient will eventually report a rating of "4" or less" on 1-10 anxiety scale for a 29-month time period where her daily functioning is not impaired.  Short term goal: Increase understanding of beliefs and messages that produce the worry and anxiety. Strategy: Patient will explore and work to stop cognitive messages that feed anxiety and work to replace them with more positive and empowering messages  Diagnosis:   ICD-10-CM   1. Bipolar II disorder (HCC)  F31.81      Plan: Patient today in session focused more on some of her previous work regarding challenging family relationships and some much-needed boundaries with particular family members.  Is showing progress and needs to continues working with goal directed behaviors to move in a forward direction.Encouraged patient in her practice of more positive/self affirming behaviors as noted in session including: Remain in contact with supportive people, remain on her prescribed medications, decrease watching of disturbing news on TV as this feeds her anxiety, recognizing more of her strengths versus limitations, setting limits with others who are unhealthy for her, getting outside on her porch which is one of her favorite places to be, healthy nutrition, stay in the present focusing on what she can change or control rather than remain stuck in the past.  Positive self talk, and realize the strength she shows working with goal-directed behaviors to move in a direction that supports her improved emotional health and overall wellbeing.  Self rating scales: (05/24/23) 1-10 depression scale-2 1-10 anxiety scale-6 1-10 self-esteem scale-9 1-10 motivation scale-8/9 1-10 hopefulness scale-7/8  Goal review and progress/challenges noted with patient.  Next appointment within 3 to 4 weeks.   Mathis Fare, LCSW

## 2023-05-25 ENCOUNTER — Encounter: Payer: Self-pay | Admitting: Allergy

## 2023-05-25 ENCOUNTER — Telehealth: Payer: Self-pay

## 2023-05-25 ENCOUNTER — Other Ambulatory Visit: Payer: Self-pay

## 2023-05-25 ENCOUNTER — Telehealth (INDEPENDENT_AMBULATORY_CARE_PROVIDER_SITE_OTHER): Payer: 59 | Admitting: Allergy

## 2023-05-25 ENCOUNTER — Other Ambulatory Visit (HOSPITAL_COMMUNITY): Payer: Self-pay

## 2023-05-25 DIAGNOSIS — T7800XD Anaphylactic reaction due to unspecified food, subsequent encounter: Secondary | ICD-10-CM

## 2023-05-25 DIAGNOSIS — H1013 Acute atopic conjunctivitis, bilateral: Secondary | ICD-10-CM

## 2023-05-25 DIAGNOSIS — J0140 Acute pansinusitis, unspecified: Secondary | ICD-10-CM

## 2023-05-25 DIAGNOSIS — J3089 Other allergic rhinitis: Secondary | ICD-10-CM | POA: Diagnosis not present

## 2023-05-25 DIAGNOSIS — J4541 Moderate persistent asthma with (acute) exacerbation: Secondary | ICD-10-CM | POA: Diagnosis not present

## 2023-05-25 DIAGNOSIS — L2089 Other atopic dermatitis: Secondary | ICD-10-CM | POA: Diagnosis not present

## 2023-05-25 MED ORDER — FEXOFENADINE HCL 180 MG PO TABS: ORAL_TABLET | ORAL | 5 refills | Status: DC

## 2023-05-25 MED ORDER — FAMOTIDINE 20 MG PO TABS: ORAL_TABLET | ORAL | 2 refills | Status: DC

## 2023-05-25 MED ORDER — BUDESONIDE-FORMOTEROL FUMARATE 160-4.5 MCG/ACT IN AERO: INHALATION_SPRAY | RESPIRATORY_TRACT | 5 refills | Status: DC

## 2023-05-25 MED ORDER — TRIAMCINOLONE ACETONIDE 55 MCG/ACT NA AERO: 2.0000 | INHALATION_SPRAY | Freq: Every day | NASAL | 5 refills | Status: AC

## 2023-05-25 MED ORDER — EPINEPHRINE 0.3 MG/0.3ML IJ SOAJ: INTRAMUSCULAR | 0 refills | Status: DC

## 2023-05-25 MED ORDER — AMOXICILLIN-POT CLAVULANATE 875-125 MG PO TABS: 1.0000 | ORAL_TABLET | Freq: Two times a day (BID) | ORAL | 0 refills | Status: AC

## 2023-05-25 MED ORDER — ALBUTEROL SULFATE HFA 108 (90 BASE) MCG/ACT IN AERS: INHALATION_SPRAY | RESPIRATORY_TRACT | 2 refills | Status: DC

## 2023-05-25 MED ORDER — MONTELUKAST SODIUM 10 MG PO TABS: 10.0000 mg | ORAL_TABLET | Freq: Every day | ORAL | 2 refills | Status: DC

## 2023-05-25 MED ORDER — AZELASTINE HCL 0.1 % NA SOLN: 2.0000 | Freq: Two times a day (BID) | NASAL | 1 refills | Status: DC

## 2023-05-25 MED ORDER — DESONIDE 0.05 % EX OINT: TOPICAL_OINTMENT | CUTANEOUS | 1 refills | Status: DC

## 2023-05-25 MED ORDER — FLUTICASONE PROPIONATE 50 MCG/ACT NA SUSP: NASAL | 1 refills | Status: DC

## 2023-05-25 MED ORDER — FLUTICASONE PROPIONATE HFA 110 MCG/ACT IN AERO: INHALATION_SPRAY | RESPIRATORY_TRACT | 5 refills | Status: DC

## 2023-05-25 MED ORDER — IPRATROPIUM BROMIDE 0.06 % NA SOLN: 2.0000 | Freq: Four times a day (QID) | NASAL | 5 refills | Status: DC | PRN

## 2023-05-25 MED ORDER — ALBUTEROL SULFATE (2.5 MG/3ML) 0.083% IN NEBU: INHALATION_SOLUTION | RESPIRATORY_TRACT | 1 refills | Status: DC

## 2023-05-25 MED ORDER — PREDNISONE 20 MG PO TABS: 20.0000 mg | ORAL_TABLET | Freq: Two times a day (BID) | ORAL | 0 refills | Status: AC

## 2023-05-25 NOTE — Telephone Encounter (Signed)
Forwarding message to provider for next step. 

## 2023-05-25 NOTE — Progress Notes (Signed)
RE: Chelsea Benson MRN: [id number] DOB: Sep 04, 1951 Date of Telemedicine Visit: 05/25/2023  Primary care provider: Jackelyn Poling, DO  Chief Complaint: Nasal Congestion and Sinusitis   Telemedicine Follow Up Visit via Video: I connected with Chelsea Benson for a follow up on 05/25/23 by video and verified that I am speaking with the correct person using two identifiers.   I discussed the limitations, risks, security and privacy concerns of performing an evaluation and management service by telephone and the availability of in person appointments. I also discussed with the patient that there may be a patient responsible charge related to this service. The patient expressed understanding and agreed to proceed.  Patient is at home. Provider is at the office.  Visit start time: 11:45 Visit end time: 11:53 Insurance consent/check in by: [facility] Medical consent and medical assistant/nurse: Chelsea Benson  History of Present Illness: She is a 72 y.o. female, who is being followed for asthma, allergic rhinitis with conjunctivitis, food allergy, atopic dermatitis and reflux. Her previous allergy office visit was on 01/31/23 with  Chelsea Benson .  At this visit she discussed a cough and she had a CXR that was unremarkable.  She states for the past week now she has been having sinus pressure, pain in forehead and cheeks as well as increased nasal congestion and drainage, crusting around the nose, coughing up yellow mucus that's worse in the morning time, wheezing, sweats and chills.  She has not tried any OTC medications for these symptoms.  She does not feel her rescue inhaler is working well with these symptoms.  She has continued her regular medications which include Symbicort 2 puffs twice day, singulair, as well as antihistamine allegra, nasal sprays fluticasone and astelin, bepreve eye drop.  She also continues to receive Nucala monthly injections administered by her daughter without issue; no large local or  systemic symptoms.  She also has access to flovent to add in during times of asthma flare or illness.   She states outside of this past week her allergy regimen was helping in controlling her day to day allergy symptoms.   She has not had any systemic steroids since last visit or antibiotic needs.  She avoids almonds and has an epinephrine device.   For eczema control she has triamincinolone for as needed use.  For reflux she takes protonix for control.    Assessment and Plan: Chelsea Benson is a 72 y.o. female with:   Sinusitis with asthma exacerbation Symptoms ongoing for week now not improving with sinus pressure/pain, increased congestion and mucus, headache, wheezing, cough, chills/sweats Will treat with Augmentin course 1 tab twice a day x 10 days Prednisone 20mg  1 tab twice a day x 5 days Get plenty of rest and stay well hydrated If needed can use Mucinex with plenty of water to help with cough or mucus thinning (reports currently mucus is thin)  Asthma Continue Symbicort 160- 2 puffs twice a day to prevent cough or wheeze. Continue Montelukast 10mg  once a day to prevent cough or wheeze. Continue Nucala injections once every 4 weeks. Asthma Action Plan (during respiratory illness or asthma flare): add in Flovent 2 puffs twice a day for 1-2 weeks and can stop once illness/symptoms have resolved.  Asthma control goals:  Full participation in all desired activities (may need albuterol before activity) Albuterol use two time or less a week on average (not counting use with activity) Cough interfering with sleep two time or less a month Oral steroids no more than once  a year No hospitalizations  Allergic Rhinitis/Allergic Conjunctivitis Chronic Cough with Post Nasal Drainage -Continue allergen avoidance measures directed toward pollens, mold, pet, dust mite, cockroach, and mouse urine. - Use nasal saline rinses before nose sprays such as with Neilmed Sinus Rinse.  Use distilled water.    - Use Flonase 2 sprays each nostril daily or 1 spray twice daily. Aim upward and outward. - Use Azelastine 2 sprays each nostril twice daily. Aim upward and outward. - Use Ipratroprium 1-2 sprays up to four times daily as needed for runny nose. Aim upward and outward. - Use Allegra 180mg  daily.  - Use Singulair 10mg  daily.   - For eyes, use Bepreve 1 eye drop daily as needed for itchy, watery eyes.  Also use lubricating eye drops as needed.   Food allergy Continue to avoid almonds.  In case of an allergic reaction, take Benadryl 50 mg every 4 hours, and if life-threatening symptoms occur, inject with EpiPen 0.3 mg.   Atopic dermatitis Continue a twice a day moisturizing routine with Aveeno. Continue triamcinolone 0.1% ointment up to red and itchy areas below your face up to twice a day as needed.   Reflux Continue dietary and lifestyle modifications. Continue Protonix 40 mg once a day as previously prescribed.    Follow-up in 4-6 months or sooner if needed   Diagnostics: None.  Medication List:  Current Outpatient Medications  Medication Sig Dispense Refill   ACCU-CHEK GUIDE test strip TEST BID PRN  1   amoxicillin-clavulanate (AUGMENTIN) 875-125 MG tablet Take 1 tablet by mouth 2 (two) times daily for 10 days. 20 tablet 0   apixaban (ELIQUIS) 5 MG TABS tablet Take 1 tablet (5 mg total) by mouth 2 (two) times daily. 200 tablet 2   ARIPiprazole (ABILIFY) 2 MG tablet Take 2 tablets (4 mg total) by mouth daily. 180 tablet 1   Bepotastine Besilate 1.5 % SOLN Place 1 drop into both eyes 2 (two) times daily. 30 mL 1   Blood Glucose Monitoring Suppl (ACCU-CHEK GUIDE) w/Device KIT See admin instructions.  1   busPIRone (BUSPAR) 30 MG tablet Take 1 tablet (30 mg total) by mouth 2 (two) times daily. 180 tablet 0   CALCIUM-VITAMIN D PO Take 1 tablet by mouth daily.     Carbinoxamine Maleate 4 MG TABS Take 1 tablet (4 mg total) by mouth 3 (three) times daily as needed. 180 tablet 1    clonazePAM (KLONOPIN) 0.5 MG tablet Take 1/2-1 tab po TID prn anxiety. (Do not combine with Ambien) 270 tablet 1   diltiazem (CARDIZEM CD) 300 MG 24 hr capsule Take 1 capsule (300 mg total) by mouth daily. 100 capsule 2   diltiazem (CARDIZEM) 60 MG tablet TAKE 1 TABLET BY MOUTH EVERY 6 HOURS AS NEEDED FOR PALPITATION EDISODE. UP TO 2 TABLETS PER DAY 60 tablet 7   hydrochlorothiazide (HYDRODIURIL) 25 MG tablet TAKE 1 TABLET BY MOUTH DAILY 90 tablet 3   Lancets (ONETOUCH DELICA PLUS LANCET33G) MISC SMARTSIG:1 Topical Daily     meloxicam (MOBIC) 7.5 MG tablet Take 1 tablet (7.5 mg total) by mouth daily. 30 tablet 0   Mepolizumab (NUCALA) 100 MG/ML SOAJ Inject 1 mL (100 mg total) into the skin every 28 (twenty-eight) days. 1 mL 11   metFORMIN (GLUCOPHAGE-XR) 500 MG 24 hr tablet Take 500 mg by mouth in the morning and at bedtime.     methocarbamol (ROBAXIN) 500 MG tablet Take 500 mg by mouth as needed for muscle spasms.  mirtazapine (REMERON) 30 MG tablet TAKE 1 TABLET BY MOUTH AT  BEDTIME 90 tablet 3   omeprazole (PRILOSEC) 20 MG capsule omeprazole 20 mg capsule,delayed release TK ONE C PO  BID     Oxcarbazepine (TRILEPTAL) 300 MG tablet TAKE 2 TABLETS BY MOUTH AT  BEDTIME 200 tablet 1   oxybutynin (DITROPAN-XL) 10 MG 24 hr tablet Take 10 mg by mouth daily.     potassium chloride SA (KLOR-CON M) 20 MEQ tablet TAKE 1 TABLET BY MOUTH DAILY 100 tablet 1   pravastatin (PRAVACHOL) 20 MG tablet Take 1 tablet (20 mg total) by mouth at bedtime. 90 tablet 3   predniSONE (DELTASONE) 20 MG tablet Take 1 tablet (20 mg total) by mouth 2 (two) times daily with a meal for 5 days. 10 tablet 0   pregabalin (LYRICA) 100 MG capsule Take 100 mg by mouth 3 (three) times daily.     Respiratory Therapy Supplies (NEBULIZER) DEVI Use as directed with nebulizer solution. 1 each 1   Respiratory Therapy Supplies (NEBULIZER/TUBING/MOUTHPIECE) KIT Use as directed with nebulizer machine 1 kit 12   Semaglutide, 1 MG/DOSE,  (OZEMPIC, 1 MG/DOSE,) 2 MG/1.5ML SOPN Take 1 mg by mouth once a week.     zolpidem (AMBIEN) 10 MG tablet Take 0.5-1 tablets (5-10 mg total) by mouth at bedtime as needed. for sleep 90 tablet 1   albuterol (PROVENTIL) (2.5 MG/3ML) 0.083% nebulizer solution USE 1 VIAL IN NEBULIZER EVERY 4 HOURS - and as needed 225 mL 1   albuterol (VENTOLIN HFA) 108 (90 Base) MCG/ACT inhaler USE 2 INHALATIONS BY MOUTH EVERY 4 HOURS AS NEEDED FOR WHEEZING  OR SHORTNESS OF BREATH 40.2 g 2   azelastine (ASTELIN) 0.1 % nasal spray Place 2 sprays into both nostrils 2 (two) times daily. 90 mL 1   budesonide-formoterol (SYMBICORT) 160-4.5 MCG/ACT inhaler INHALE 2 INHALATIONS BY MOUTH  INTO THE LUNGS IN THE MORNING  AND AT BEDTIME 10.2 g 5   desonide (DESOWEN) 0.05 % ointment APPLY TO AFFECTED AREA(S)  TOPICALLY TWICE DAILY AS  NEEDED 180 g 1   EPINEPHrine 0.3 mg/0.3 mL IJ SOAJ injection INJECT INTRAMUSCULARLY 1  PEN AS NEEDED FOR ALLERGIC  RESPONSE AS DIRECTED BY MD. SEEK MEDICAL HELP AFTER  USE. 2 each 0   famotidine (PEPCID) 20 MG tablet TAKE 1 TABLET BY MOUTH DAILY  AFTER SUPPER 100 tablet 2   fexofenadine (ALLERGY RELIEF) 180 MG tablet TAKE 1 TABLET(180 MG) BY MOUTH DAILY 30 tablet 5   fluticasone (FLONASE) 50 MCG/ACT nasal spray USE 2 SPRAYS IN BOTH NOSTRILS  DAILY 48 g 1   fluticasone (FLOVENT HFA) 110 MCG/ACT inhaler Inhale 4 puffs twice a day with a spacer for the next 2 weeks or until cough and wheeze free 1 each 5   ipratropium (ATROVENT) 0.06 % nasal spray Place 2 sprays into both nostrils 4 (four) times daily as needed for rhinitis. 15 mL 5   montelukast (SINGULAIR) 10 MG tablet Take 1 tablet (10 mg total) by mouth at bedtime. 100 tablet 2   tiZANidine (ZANAFLEX) 2 MG tablet      triamcinolone (NASACORT) 55 MCG/ACT AERO nasal inhaler Place 2 sprays into the nose daily. 16.9 mL 5   No current facility-administered medications for this visit.   Allergies: Allergies  Allergen Reactions   Ramipril Other (See  Comments)   Cymbalta [Duloxetine Hcl] Other (See Comments)    Caused hair loss   Methylprednisolone Acetate Hives and Rash    05/29/20 developed hives "  all over my back" after MBB; resolved with Benadryl and "a few days."  Can tolerate Dexamethasone/Decadron. 05/29/20 developed hives "all over my back" after MBB; resolved with Benadryl and "a few days."  Can tolerate Dexamethasone/Decadron.   Rosuvastatin Calcium Other (See Comments) and Cough    Rapid heart rate   Almond Mea Itching and Swelling    Tongue swells and itches   Almond Oil Itching and Swelling    Itching and swelling of tongue    Carvedilol Cough   Fish Allergy Itching    Halibut fish   Losartan Potassium Cough   Morphine And Codeine Itching   I reviewed her past medical history, social history, family history, and environmental history and no significant changes have been reported from previous visit on 01/31/23.  Review of Systems  Constitutional:  Positive for chills and diaphoresis.  HENT:  Positive for congestion, rhinorrhea, sinus pressure and sinus pain.   Eyes: Negative.   Respiratory:  Positive for cough and wheezing.   Cardiovascular: Negative.   Gastrointestinal: Negative.   Musculoskeletal: Negative.   Neurological:  Positive for headaches.   Objective: Physical Exam Not obtained as encounter was done via telephone.   Previous notes and tests were reviewed.  I discussed the assessment and treatment plan with the patient. The patient was provided an opportunity to ask questions and all were answered. The patient agreed with the plan and demonstrated an understanding of the instructions.   The patient was advised to call back or seek an in-person evaluation if the symptoms worsen or if the condition fails to improve as anticipated.  I provided 8 minutes of non-face-to-face time during this encounter.  It was my pleasure to participate in York Armon's care today. Please feel free to contact me with  any questions or concerns.   Sincerely,  Blayde Bacigalupi Larose Hires, MD

## 2023-05-25 NOTE — Telephone Encounter (Signed)
Patient called in - DOB verified - stated she did televisit with Dr. Delorse Lek today, Thursday, 05/25/23 - doesn't remember whether or not they discussed her cough. Patient stated she is now coughing up yellow mucous - requesting cough medicine to be called in.  Patient advised per provider:   Sinusitis with asthma exacerbation Symptoms ongoing for week now not improving with sinus pressure/pain, increased congestion and mucus, headache, wheezing, cough, chills/sweats Will treat with Augmentin course 1 tab twice a day x 10 days Prednisone 20mg  1 tab twice a day x 5 days Get plenty of rest and stay well hydrated If needed can use Mucinex with plenty of water to help with cough or mucus thinning (reports currently mucus is thin)  Patient verbalized understanding, no further questions.

## 2023-05-25 NOTE — Patient Instructions (Addendum)
Sinusitis with asthma exacerbation Symptoms ongoing for week now not improving with sinus pressure/pain, increased congestion and mucus, headache, wheezing, cough, chills/sweats Will treat with Augmentin course 1 tab twice a day x 10 days Prednisone 20mg  1 tab twice a day x 5 days Get plenty of rest and stay well hydrated If needed can use Mucinex with plenty of water to help with cough or mucus thinning (reports currently mucus is thin)  Asthma Continue Symbicort 160- 2 puffs twice a day to prevent cough or wheeze. Continue Montelukast 10mg  once a day to prevent cough or wheeze. Continue Nucala injections once every 4 weeks. Asthma Action Plan (during respiratory illness or asthma flare): add in Flovent 2 puffs twice a day for 1-2 weeks and can stop once illness/symptoms have resolved.  Asthma control goals:  Full participation in all desired activities (may need albuterol before activity) Albuterol use two time or less a week on average (not counting use with activity) Cough interfering with sleep two time or less a month Oral steroids no more than once a year No hospitalizations  Allergic Rhinitis/Allergic Conjunctivitis Chronic Cough with Post Nasal Drainage -Continue allergen avoidance measures directed toward pollens, mold, pet, dust mite, cockroach, and mouse urine. - Use nasal saline rinses before nose sprays such as with Neilmed Sinus Rinse.  Use distilled water.   - Use Flonase 2 sprays each nostril daily or 1 spray twice daily. Aim upward and outward. - Use Azelastine 2 sprays each nostril twice daily. Aim upward and outward. - Use Ipratroprium 1-2 sprays up to four times daily as needed for runny nose. Aim upward and outward. - Use Allegra 180mg  daily.  - Use Singulair 10mg  daily.   - For eyes, use Bepreve 1 eye drop daily as needed for itchy, watery eyes.  Also use lubricating eye drops as needed.   Food allergy Continue to avoid almonds.  In case of an allergic reaction,  take Benadryl 50 mg every 4 hours, and if life-threatening symptoms occur, inject with EpiPen 0.3 mg.   Atopic dermatitis Continue a twice a day moisturizing routine with Aveeno. Continue triamcinolone 0.1% ointment up to red and itchy areas below your face up to twice a day as needed.   Reflux Continue dietary and lifestyle modifications. Continue Protonix 40 mg once a day as previously prescribed.   Follow-up in 4-6 months or sooner if needed

## 2023-05-25 NOTE — Telephone Encounter (Signed)
Patient Advocate Encounter   Received notification from [employer] Part D that prior authorization is required for Fluticasone Propionate HFA 110MCG/ACT aerosol   Submitted: 05-25-2023 Key [id number]  Status is pending

## 2023-05-25 NOTE — Telephone Encounter (Signed)
Patient Advocate Encounter  Received a fax from [employer] Part D regarding Prior Authorization for Fluticasone Propionate HFA 110MCG/ACT aerosol.   Key: [id number]  Authorization has been DENIED due to

## 2023-05-26 MED ORDER — ARNUITY ELLIPTA 100 MCG/ACT IN AEPB: 1.0000 | INHALATION_SPRAY | Freq: Every day | RESPIRATORY_TRACT | 5 refills | Status: DC | PRN

## 2023-05-26 NOTE — Telephone Encounter (Signed)
Spoke to patient and informed her of the Arnuity being sent in and how to use it. Patient understood.  Tomma Lightning [phone]

## 2023-05-29 ENCOUNTER — Other Ambulatory Visit: Payer: Self-pay | Admitting: *Deleted

## 2023-05-29 MED ORDER — NUCALA 100 MG/ML ~~LOC~~ SOAJ: 100.0000 mg | SUBCUTANEOUS | 11 refills | Status: DC

## 2023-05-29 NOTE — Addendum Note (Signed)
Addended by: Devoria Glassing on: 05/29/2023 02:35 PM   Modules accepted: Orders

## 2023-05-30 DIAGNOSIS — D6869 Other thrombophilia: Secondary | ICD-10-CM | POA: Diagnosis not present

## 2023-05-30 DIAGNOSIS — E1136 Type 2 diabetes mellitus with diabetic cataract: Secondary | ICD-10-CM | POA: Diagnosis not present

## 2023-05-30 DIAGNOSIS — I480 Paroxysmal atrial fibrillation: Secondary | ICD-10-CM | POA: Diagnosis not present

## 2023-05-30 DIAGNOSIS — J4550 Severe persistent asthma, uncomplicated: Secondary | ICD-10-CM | POA: Diagnosis not present

## 2023-05-30 DIAGNOSIS — M25552 Pain in left hip: Secondary | ICD-10-CM | POA: Diagnosis not present

## 2023-05-31 ENCOUNTER — Ambulatory Visit
Admission: RE | Admit: 2023-05-31 | Discharge: 2023-05-31 | Disposition: A | Discharge status: Home / Self Care | Payer: 59 | Source: Ambulatory Visit | Attending: Family Medicine | Admitting: Family Medicine

## 2023-05-31 ENCOUNTER — Other Ambulatory Visit: Payer: Self-pay | Admitting: Family Medicine

## 2023-05-31 DIAGNOSIS — M25552 Pain in left hip: Secondary | ICD-10-CM

## 2023-06-07 DIAGNOSIS — E1140 Type 2 diabetes mellitus with diabetic neuropathy, unspecified: Secondary | ICD-10-CM | POA: Diagnosis not present

## 2023-06-07 DIAGNOSIS — I480 Paroxysmal atrial fibrillation: Secondary | ICD-10-CM | POA: Diagnosis not present

## 2023-06-07 DIAGNOSIS — I10 Essential (primary) hypertension: Secondary | ICD-10-CM | POA: Diagnosis not present

## 2023-06-07 DIAGNOSIS — E785 Hyperlipidemia, unspecified: Secondary | ICD-10-CM | POA: Diagnosis not present

## 2023-06-07 DIAGNOSIS — E1169 Type 2 diabetes mellitus with other specified complication: Secondary | ICD-10-CM | POA: Diagnosis not present

## 2023-06-27 ENCOUNTER — Telehealth: Payer: Self-pay | Admitting: Psychiatry

## 2023-06-27 NOTE — Telephone Encounter (Signed)
Pt called requesting RF Clonazepam [facility] [address]pt 8/20

## 2023-06-27 NOTE — Telephone Encounter (Signed)
LF 5/29 for 90 days

## 2023-06-28 ENCOUNTER — Ambulatory Visit: Payer: 59 | Admitting: Psychiatry

## 2023-06-28 NOTE — Telephone Encounter (Signed)
Told patient she is not due for a RF yet based on last one.

## 2023-06-29 ENCOUNTER — Other Ambulatory Visit: Payer: Self-pay | Admitting: Cardiology

## 2023-07-10 ENCOUNTER — Telehealth: Payer: Self-pay | Admitting: *Deleted

## 2023-07-10 DIAGNOSIS — R195 Other fecal abnormalities: Secondary | ICD-10-CM | POA: Diagnosis not present

## 2023-07-10 DIAGNOSIS — D649 Anemia, unspecified: Secondary | ICD-10-CM | POA: Diagnosis not present

## 2023-07-10 DIAGNOSIS — I4891 Unspecified atrial fibrillation: Secondary | ICD-10-CM | POA: Diagnosis not present

## 2023-07-10 DIAGNOSIS — Z7901 Long term (current) use of anticoagulants: Secondary | ICD-10-CM | POA: Diagnosis not present

## 2023-07-10 DIAGNOSIS — Z86010 Personal history of colonic polyps: Secondary | ICD-10-CM | POA: Diagnosis not present

## 2023-07-10 NOTE — Telephone Encounter (Signed)
   Pre-operative Risk Assessment    Patient Name: Chelsea Benson  DOB: Jan 22, 1951 MRN: [id number]      Request for Surgical Clearance    Procedure:   COLONOSCOPY  Date of Surgery:  Clearance 08/01/23                                 Surgeon:  DR. Bosie Clos Surgeon's Group or Practice Name:  [facility] Phone number:  [phone] Fax number:  [phone]   Type of Clearance Requested:   - Medical  - Pharmacy:  Hold Apixaban (Eliquis) NOT INDICATED HOW LONG   Type of Anesthesia:   PROPOFOL   Additional requests/questions:    [name]   07/10/2023, 10:18 AM

## 2023-07-11 ENCOUNTER — Telehealth: Payer: Self-pay

## 2023-07-11 ENCOUNTER — Ambulatory Visit (INDEPENDENT_AMBULATORY_CARE_PROVIDER_SITE_OTHER): Payer: 59 | Admitting: Psychiatry

## 2023-07-11 DIAGNOSIS — F419 Anxiety disorder, unspecified: Secondary | ICD-10-CM

## 2023-07-11 NOTE — Telephone Encounter (Signed)
Pt is scheduled tele on 08/21 at 9am. Med rec and consent done

## 2023-07-11 NOTE — Telephone Encounter (Signed)
Pt is scheduled tele on 08/21 at 9am. Med rec and consent done    Patient Consent for Virtual Visit        Chelsea Benson has provided verbal consent on 07/11/2023 for a virtual visit (video or telephone).   CONSENT FOR VIRTUAL VISIT FOR:  Chelsea Benson  By participating in this virtual visit I agree to the following:  I hereby voluntarily request, consent and authorize [facility] and its employed or contracted physicians, physician assistants, nurse practitioners or other licensed health care professionals (the Practitioner), to provide me with telemedicine health care services (the "Services") as deemed necessary by the treating Practitioner. I acknowledge and consent to receive the Services by the Practitioner via telemedicine. I understand that the telemedicine visit will involve communicating with the Practitioner through live audiovisual communication technology and the disclosure of certain medical information by electronic transmission. I acknowledge that I have been given the opportunity to request an in-person assessment or other available alternative prior to the telemedicine visit and am voluntarily participating in the telemedicine visit.  I understand that I have the right to withhold or withdraw my consent to the use of telemedicine in the course of my care at any time, without affecting my right to future care or treatment, and that the Practitioner or I may terminate the telemedicine visit at any time. I understand that I have the right to inspect all information obtained and/or recorded in the course of the telemedicine visit and may receive copies of available information for a reasonable fee.  I understand that some of the potential risks of receiving the Services via telemedicine include:  Delay or interruption in medical evaluation due to technological equipment failure or disruption; Information transmitted may not be sufficient (e.g. poor resolution of  images) to allow for appropriate medical decision making by the Practitioner; and/or  In rare instances, security protocols could fail, causing a breach of personal health information.  Furthermore, I acknowledge that it is my responsibility to provide information about my medical history, conditions and care that is complete and accurate to the best of my ability. I acknowledge that Practitioner's advice, recommendations, and/or decision may be based on factors not within their control, such as incomplete or inaccurate data provided by me or distortions of diagnostic images or specimens that may result from electronic transmissions. I understand that the practice of medicine is not an exact science and that Practitioner makes no warranties or guarantees regarding treatment outcomes. I acknowledge that a copy of this consent can be made available to me via my patient portal [facility] MyChart), or I can request a printed copy by calling the office of  [facility].    I understand that my insurance will be billed for this visit.   I have read or had this consent read to me. I understand the contents of this consent, which adequately explains the benefits and risks of the Services being provided via telemedicine.  I have been provided ample opportunity to ask questions regarding this consent and the Services and have had my questions answered to my satisfaction. I give my informed consent for the services to be provided through the use of telemedicine in my medical care

## 2023-07-11 NOTE — Telephone Encounter (Signed)
Patient with diagnosis of afib on Eliquis for anticoagulation.    Procedure: colonoscopy Date of procedure: 08/01/23   CHA2DS2-VASc Score = 4   This indicates a 4.8% annual risk of stroke. The patient's score is based upon: CHF History: 0 HTN History: 1 Diabetes History: 1 Stroke History: 0 Vascular Disease History: 0 Age Score: 1 Gender Score: 1      Patient with remote hx of PE in 2017  CrCl 77 ml/min Platelet count 368  Per office protocol, patient can hold Eliquis for 2 days prior to procedure.    **This guidance is not considered finalized until pre-operative APP has relayed final recommendations.**
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
## 2023-07-11 NOTE — Telephone Encounter (Signed)
   Name: Chelsea Benson  DOB: [birth year]  MRN: [id number]  Primary Cardiologist: Bryan Lemma, MD   Preoperative team, please contact this patient and set up a phone call appointment for further preoperative risk assessment. Please obtain consent and complete medication review. Thank you for your help.  I confirm that guidance regarding antiplatelet and oral anticoagulation therapy has been completed and, if necessary, noted below.  Per pharm D, patient may hold Eliquis for 2 days prior to procedure.    Carlos Levering, NP 07/11/2023, 2:50 PM [facility]

## 2023-07-11 NOTE — Telephone Encounter (Signed)
Please advise holding Eliquis prior to colonoscopy scheduled for 08/01/2023.  Thank you!  DW

## 2023-07-12 DIAGNOSIS — R42 Dizziness and giddiness: Secondary | ICD-10-CM | POA: Diagnosis not present

## 2023-07-12 DIAGNOSIS — N3281 Overactive bladder: Secondary | ICD-10-CM | POA: Diagnosis not present

## 2023-07-12 DIAGNOSIS — I10 Essential (primary) hypertension: Secondary | ICD-10-CM | POA: Diagnosis not present

## 2023-07-12 DIAGNOSIS — E785 Hyperlipidemia, unspecified: Secondary | ICD-10-CM | POA: Diagnosis not present

## 2023-07-12 DIAGNOSIS — I480 Paroxysmal atrial fibrillation: Secondary | ICD-10-CM | POA: Diagnosis not present

## 2023-07-12 DIAGNOSIS — E1136 Type 2 diabetes mellitus with diabetic cataract: Secondary | ICD-10-CM | POA: Diagnosis not present

## 2023-07-14 DIAGNOSIS — M47816 Spondylosis without myelopathy or radiculopathy, lumbar region: Secondary | ICD-10-CM | POA: Diagnosis not present

## 2023-07-14 DIAGNOSIS — G894 Chronic pain syndrome: Secondary | ICD-10-CM | POA: Diagnosis not present

## 2023-07-14 DIAGNOSIS — M62838 Other muscle spasm: Secondary | ICD-10-CM | POA: Diagnosis not present

## 2023-07-14 DIAGNOSIS — M791 Myalgia, unspecified site: Secondary | ICD-10-CM | POA: Diagnosis not present

## 2023-07-17 ENCOUNTER — Telehealth: Payer: Self-pay | Admitting: Allergy

## 2023-07-17 DIAGNOSIS — M47816 Spondylosis without myelopathy or radiculopathy, lumbar region: Secondary | ICD-10-CM | POA: Diagnosis not present

## 2023-07-17 DIAGNOSIS — G894 Chronic pain syndrome: Secondary | ICD-10-CM | POA: Diagnosis not present

## 2023-07-17 DIAGNOSIS — M62838 Other muscle spasm: Secondary | ICD-10-CM | POA: Diagnosis not present

## 2023-07-17 MED ORDER — BEPOTASTINE BESILATE 1.5 % OP SOLN: OPHTHALMIC | 1 refills | Status: DC

## 2023-07-17 NOTE — Telephone Encounter (Signed)
Patient is requesting refills for her astelin and ipratropium nasal sprays. Patient is also requesting refill for eyedrops.   [facility] - [address] [place] [address]  Best contact number: [phone]

## 2023-07-17 NOTE — Telephone Encounter (Signed)
Called patient - DOB/Pharmacy verified - advised the following: Azelastine (Astelin) and Ipratropium (Atrovent) have refills; sending in refill prescription for Bepotastine Besilate (Bepreve) 1.5% eye solution.  Patient verbalized understanding to all, no further questions.

## 2023-07-20 DIAGNOSIS — R3121 Asymptomatic microscopic hematuria: Secondary | ICD-10-CM | POA: Diagnosis not present

## 2023-07-20 DIAGNOSIS — N200 Calculus of kidney: Secondary | ICD-10-CM | POA: Diagnosis not present

## 2023-07-20 DIAGNOSIS — R3129 Other microscopic hematuria: Secondary | ICD-10-CM | POA: Diagnosis not present

## 2023-07-25 ENCOUNTER — Encounter: Payer: Self-pay | Admitting: Psychiatry

## 2023-07-25 ENCOUNTER — Telehealth (INDEPENDENT_AMBULATORY_CARE_PROVIDER_SITE_OTHER): Payer: 59 | Admitting: Psychiatry

## 2023-07-25 DIAGNOSIS — F5101 Primary insomnia: Secondary | ICD-10-CM

## 2023-07-25 DIAGNOSIS — F3181 Bipolar II disorder: Secondary | ICD-10-CM | POA: Diagnosis not present

## 2023-07-25 DIAGNOSIS — F410 Panic disorder [episodic paroxysmal anxiety] without agoraphobia: Secondary | ICD-10-CM

## 2023-07-25 DIAGNOSIS — F419 Anxiety disorder, unspecified: Secondary | ICD-10-CM

## 2023-07-25 MED ORDER — ZOLPIDEM TARTRATE 10 MG PO TABS
5.0000 mg | ORAL_TABLET | Freq: Every evening | ORAL | 1 refills | Status: DC | PRN
Start: 2023-07-25 — End: 2023-08-28

## 2023-07-25 MED ORDER — BUSPIRONE HCL 30 MG PO TABS
30.0000 mg | ORAL_TABLET | Freq: Two times a day (BID) | ORAL | 0 refills | Status: DC
Start: 2023-07-25 — End: 2023-09-27

## 2023-07-25 MED ORDER — OXCARBAZEPINE 300 MG PO TABS: 600.0000 mg | ORAL_TABLET | Freq: Every day | ORAL | 1 refills | Status: DC

## 2023-07-25 MED ORDER — ARIPIPRAZOLE 2 MG PO TABS: 4.0000 mg | ORAL_TABLET | Freq: Every day | ORAL | 1 refills | Status: DC

## 2023-07-25 MED ORDER — CLONAZEPAM 0.5 MG PO TABS
ORAL_TABLET | ORAL | 1 refills | Status: DC
Start: 2023-07-25 — End: 2023-09-27

## 2023-07-25 MED ORDER — MIRTAZAPINE 30 MG PO TABS
ORAL_TABLET | ORAL | 3 refills | Status: DC
Start: 2023-07-25 — End: 2023-09-27

## 2023-07-25 NOTE — Progress Notes (Signed)
Chelsea Benson [id number] 1951-03-29 72 y.o.  Virtual Visit via Video Note  I connected with pt @ on 07/25/23 at  8:30 AM EDT by a video enabled telemedicine application and verified that I am speaking with the correct person using two identifiers.   I discussed the limitations of evaluation and management by telemedicine and the availability of in person appointments. The patient expressed understanding and agreed to proceed.  I discussed the assessment and treatment plan with the patient. The patient was provided an opportunity to ask questions and all were answered. The patient agreed with the plan and demonstrated an understanding of the instructions.   The patient was advised to call back or seek an in-person evaluation if the symptoms worsen or if the condition fails to improve as anticipated.  I provided 34 minutes of non-face-to-face time during this encounter.  The patient was located at home.  The provider was located at home.   Chelsea Benson, PMHNP   Subjective:   Patient ID:  Chelsea Benson is a 72 y.o. (DOB 1951-03-28) female.  Chief Complaint:  Chief Complaint  Patient presents with   Follow-up    Anxiety, mood disturbance, and insomnia    HPI Chelsea Benson presents for follow-up of anxiety, depression, and insomnia. She reports some anxiety about her brother and his behavior. She and her brother live in a house that her oldest daughter owns. She also reports worry about finances. She reports that her mood has been, "good, except when I get frustrated with my brother. She reports that she had "a little bit of depression" when she learned that a 47-13 yo cousin died on her birthday. Denies excessive irritability. She reports that she "used to feel very, very tired" and this improved with iron. She reports that her motivation has been good. She reports that she has not been sleeping well. She reports that Saturday night she woke up at 2:30 am and unable to return to  sleep until some time Sunday morning. Appetite has been good. Concentration has been good. Denies SI.   She spent a month in [place] and did not want to return home to [facility]. She is helping family with occasionally babysitting 54 yo grandchild. She celebrated her birthday with a party. Aunt recently visited from [place].  Oldest daughter's father recently died. She reports that oldest daughter is grieving this loss. She reports that oldest daughter's father was "violent" and controlling.   Chelsea Benson has schizophrenia and occasionally stays in the same house.   Recently reconnected with an old friend and it's like they were never apart.   Klonopin last filled 05/03/23. Ambien last filled 05/03/23.  Past Psychiatric Medication Trials: Cymbalta-hair loss Mirtazapine Prozac Trazodone-ineffective Ambien Belsomra BuSpar Hydroxyzine Abilify Trileptal Xanax  Review of Systems:  Review of Systems  Gastrointestinal:  Positive for blood in stool.       She reports that she has an upcoming colonoscopy to find cause of anemia.  Genitourinary:  Positive for hematuria.  Hematological:        Anemia  Psychiatric/Behavioral:         Please refer to HPI    Medications: I have reviewed the patient's current medications.  Current Outpatient Medications  Medication Sig Dispense Refill   ferrous sulfate 325 (65 FE) MG tablet Take 325 mg by mouth daily with breakfast.     ACCU-CHEK GUIDE test strip TEST BID PRN  1   albuterol (PROVENTIL) (2.5 MG/3ML) 0.083% nebulizer solution USE 1 VIAL  IN NEBULIZER EVERY 4 HOURS - and as needed 225 mL 1   albuterol (VENTOLIN HFA) 108 (90 Base) MCG/ACT inhaler USE 2 INHALATIONS BY MOUTH EVERY 4 HOURS AS NEEDED FOR WHEEZING  OR SHORTNESS OF BREATH 40.2 g 2   apixaban (ELIQUIS) 5 MG TABS tablet Take 1 tablet (5 mg total) by mouth 2 (two) times daily. 200 tablet 2   ARIPiprazole (ABILIFY) 2 MG tablet Take 2 tablets (4 mg total) by mouth daily. 180 tablet 1   azelastine  (ASTELIN) 0.1 % nasal spray Place 2 sprays into both nostrils 2 (two) times daily. 90 mL 1   Bepotastine Besilate 1.5 % SOLN 1 drop each eye daily as needed for itchy, watery eyes. 90 mL 1   Blood Glucose Monitoring Suppl (ACCU-CHEK GUIDE) w/Device KIT See admin instructions.  1   budesonide-formoterol (SYMBICORT) 160-4.5 MCG/ACT inhaler INHALE 2 INHALATIONS BY MOUTH  INTO THE LUNGS IN THE MORNING  AND AT BEDTIME 10.2 g 5   busPIRone (BUSPAR) 30 MG tablet Take 1 tablet (30 mg total) by mouth 2 (two) times daily. 180 tablet 0   CALCIUM-VITAMIN D PO Take 1 tablet by mouth daily.     Carbinoxamine Maleate 4 MG TABS Take 1 tablet (4 mg total) by mouth 3 (three) times daily as needed. 180 tablet 1   clonazePAM (KLONOPIN) 0.5 MG tablet Take 1/2-1 tab po TID prn anxiety. (Do not combine with Ambien) 270 tablet 1   desonide (DESOWEN) 0.05 % ointment APPLY TO AFFECTED AREA(S)  TOPICALLY TWICE DAILY AS  NEEDED 180 g 1   diltiazem (CARDIZEM CD) 300 MG 24 hr capsule Take 1 capsule (300 mg total) by mouth daily. 100 capsule 2   diltiazem (CARDIZEM) 60 MG tablet TAKE 1 TABLET BY MOUTH EVERY 6 HOURS AS NEEDED FOR PALPITATION EDISODE. UP TO 2 TABLETS PER DAY 60 tablet 7   EPINEPHrine 0.3 mg/0.3 mL IJ SOAJ injection INJECT INTRAMUSCULARLY 1  PEN AS NEEDED FOR ALLERGIC  RESPONSE AS DIRECTED BY MD. SEEK MEDICAL HELP AFTER  USE. 2 each 0   famotidine (PEPCID) 20 MG tablet TAKE 1 TABLET BY MOUTH DAILY  AFTER SUPPER 100 tablet 2   fexofenadine (ALLERGY RELIEF) 180 MG tablet TAKE 1 TABLET(180 MG) BY MOUTH DAILY 30 tablet 5   fluticasone (FLONASE) 50 MCG/ACT nasal spray USE 2 SPRAYS IN BOTH NOSTRILS  DAILY 48 g 1   fluticasone (FLOVENT HFA) 110 MCG/ACT inhaler Inhale 4 puffs twice a day with a spacer for the next 2 weeks or until cough and wheeze free 1 each 5   Fluticasone Furoate (ARNUITY ELLIPTA) 100 MCG/ACT AEPB Inhale 1 puff into the lungs daily as needed. 30 each 5   hydrochlorothiazide (HYDRODIURIL) 25 MG tablet  TAKE 1 TABLET BY MOUTH DAILY 90 tablet 3   ipratropium (ATROVENT) 0.06 % nasal spray Place 2 sprays into both nostrils 4 (four) times daily as needed for rhinitis. 15 mL 5   Lancets (ONETOUCH DELICA PLUS LANCET33G) MISC SMARTSIG:1 Topical Daily     meloxicam (MOBIC) 7.5 MG tablet Take 1 tablet (7.5 mg total) by mouth daily. 30 tablet 0   Mepolizumab (NUCALA) 100 MG/ML SOAJ Inject 1 mL (100 mg total) into the skin every 28 (twenty-eight) days. 1 mL 11   metFORMIN (GLUCOPHAGE-XR) 500 MG 24 hr tablet Take 500 mg by mouth in the morning and at bedtime.     methocarbamol (ROBAXIN) 500 MG tablet Take 500 mg by mouth as needed for muscle spasms.  mirtazapine (REMERON) 30 MG tablet TAKE 1 TABLET BY MOUTH AT  BEDTIME 90 tablet 3   montelukast (SINGULAIR) 10 MG tablet Take 1 tablet (10 mg total) by mouth at bedtime. 100 tablet 2   omeprazole (PRILOSEC) 20 MG capsule omeprazole 20 mg capsule,delayed release TK ONE C PO  BID     Oxcarbazepine (TRILEPTAL) 300 MG tablet Take 2 tablets (600 mg total) by mouth at bedtime. 200 tablet 1   oxybutynin (DITROPAN-XL) 10 MG 24 hr tablet Take 10 mg by mouth daily.     potassium chloride SA (KLOR-CON M) 20 MEQ tablet TAKE 1 TABLET BY MOUTH DAILY 100 tablet 0   pravastatin (PRAVACHOL) 20 MG tablet Take 1 tablet (20 mg total) by mouth at bedtime. 90 tablet 3   pregabalin (LYRICA) 100 MG capsule Take 100 mg by mouth 3 (three) times daily.     Respiratory Therapy Supplies (NEBULIZER) DEVI Use as directed with nebulizer solution. 1 each 1   Respiratory Therapy Supplies (NEBULIZER/TUBING/MOUTHPIECE) KIT Use as directed with nebulizer machine 1 kit 12   Semaglutide, 1 MG/DOSE, (OZEMPIC, 1 MG/DOSE,) 2 MG/1.5ML SOPN Take 1 mg by mouth once a week.     tiZANidine (ZANAFLEX) 2 MG tablet      triamcinolone (NASACORT) 55 MCG/ACT AERO nasal inhaler Place 2 sprays into the nose daily. 16.9 mL 5   zolpidem (AMBIEN) 10 MG tablet Take 0.5-1 tablets (5-10 mg total) by mouth at bedtime  as needed. for sleep 90 tablet 1   No current facility-administered medications for this visit.    Medication Side Effects: None  Allergies:  Allergies  Allergen Reactions   Ramipril Other (See Comments)   Cymbalta [Duloxetine Hcl] Other (See Comments)    Caused hair loss   Methylprednisolone Acetate Hives and Rash    05/29/20 developed hives "all over my back" after MBB; resolved with Benadryl and "a few days."  Can tolerate Dexamethasone/Decadron. 05/29/20 developed hives "all over my back" after MBB; resolved with Benadryl and "a few days."  Can tolerate Dexamethasone/Decadron.   Rosuvastatin Calcium Other (See Comments) and Cough    Rapid heart rate   Almond Mea Itching and Swelling    Tongue swells and itches   Almond Oil Itching and Swelling    Itching and swelling of tongue    Carvedilol Cough   Fish Allergy Itching    Halibut fish   Losartan Potassium Cough   Morphine And Codeine Itching    Past Medical History:  Diagnosis Date   Allergic rhinoconjunctivitis    Anginal pain (HCC)    Anxiety    Arthritis    Spondylolysis, knee and ankle arthritis pains.   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  He uses a walker   Depression    Diabetes mellitus without complication (HCC)    dx in 2007   Diabetes mellitus, type II (HCC)    Dysrhythmia    Eczema    Headache(784.0)    sinus related    History of kidney stones    History of nephrolithiasis    Hx of pulmonary embolus 2017   Hyperlipidemia    Hypertension    Insomnia  Memory change    OSA on CPAP    PSG 05/24/2016: AHI 17/hour 02 mean 76%.  HSAT 06/13/17, AHI 11-hour 02 mean 79%   Paroxysmal atrial fibrillation (HCC) 02/24/2021   CHA2DS2-VASc  score 4 (HTN, DM, female, age-9); on Eliquis   Pneumonia    Tremor of both hands     Family History  Problem Relation Age of Onset   Diabetes Sister    Diabetes Brother    Hypertension Brother    Cancer Maternal Grandmother    Heart failure Maternal Grandmother    Hypertension Paternal Grandmother    Asthma Daughter    Cancer Daughter    Depression Daughter    Hypertension Daughter    Heart failure Maternal Aunt    Pneumonia Mother    Diabetes Mother    Alcoholism Father    Alcohol abuse Father    Depression Daughter    Schizophrenia Grandchild    Allergies Neg Hx    Eczema Neg Hx    Immunodeficiency Neg Hx     Social History   Socioeconomic History   Marital status: Single    Spouse name: Not on file   Number of children: 2   Years of education: Not on file   Highest education level: Some college, no degree  Occupational History    Comment: NA  Tobacco Use   Smoking status: Never    Passive exposure: Yes   Smokeless tobacco: Never   Tobacco comments:    grandson smokes, but in his car  Vaping Use   Vaping status: Never Used  Substance and Sexual Activity   Alcohol use: Yes    Comment: rare use   Drug use: No   Sexual activity: Not Currently  Other Topics Concern   Not on file  Social History Narrative   She is currently single.  Has 2 girls.  Has some college education.  Currently disabled due to chronic back pain and not employed.   07/01/19 lives with brother Onalee Hua   Caffeine, none   Social Determinants of Health   Financial Resource Strain: Not on file  Food Insecurity: Not on file  Transportation Needs: Not on file  Physical Activity: Not on file  Stress: Not on file  Social Connections: Not on file  Intimate Partner Violence: Not on file    Past Medical History, Surgical history, Social history, and Family history were reviewed and updated as appropriate.   Please see review of systems for further details on the patient's review from today.    Objective:   Physical Exam:  There were no vitals taken for this visit.  Physical Exam Neurological:     Mental Status: She is alert and oriented to person, place, and time.     Cranial Nerves: No dysarthria.  Psychiatric:        Attention and Perception: Attention and perception normal.        Mood and Affect: Mood is not depressed.        Speech: Speech normal.        Behavior: Behavior is cooperative.        Thought Content: Thought content normal. Thought content is not paranoid or delusional. Thought content does not include homicidal or suicidal ideation. Thought content does not include homicidal or suicidal plan.        Cognition and Memory: Cognition and memory normal.        Judgment: Judgment normal.     Comments: Insight intact Mood is mildly  anxious in response to family stressors and recent medical issues     Lab Review:     Component Value Date/Time   NA 140 02/11/2023 1625   NA 141 10/07/2022 1219   K 3.9 02/11/2023 1625   CL 104 02/11/2023 1625   CO2 25 02/11/2023 1625   GLUCOSE 89 02/11/2023 1625   BUN 16 02/11/2023 1625   BUN 14 10/07/2022 1219   CREATININE 0.66 02/11/2023 1625   CALCIUM 9.7 02/11/2023 1625   PROT 6.7 02/11/2023 1625   PROT 7.0 10/07/2022 1219   ALBUMIN 3.8 02/11/2023 1625   ALBUMIN 4.7 10/07/2022 1219   AST 18 02/11/2023 1625   ALT 16 02/11/2023 1625   ALKPHOS 60 02/11/2023 1625   BILITOT 0.3 02/11/2023 1625   BILITOT <0.2 10/07/2022 1219   GFRNONAA >60 02/11/2023 1625   GFRAA 108 07/06/2020 0834       Component Value Date/Time   WBC 8.5 02/11/2023 1625   RBC 4.19 02/11/2023 1625   HGB 10.3 (L) 02/11/2023 1625   HGB 12.5 11/08/2018 1041   HCT 34.0 (L) 02/11/2023 1625   HCT 39.5 11/08/2018 1041   PLT 368 02/11/2023 1625   MCV 81.1 02/11/2023 1625   MCV 81 11/08/2018 1041   MCH 24.6 (L) 02/11/2023 1625   MCHC 30.3 02/11/2023 1625   RDW 18.6 (H) 02/11/2023 1625   RDW 14.3 11/08/2018 1041   LYMPHSABS 1.3 02/11/2023  1625   LYMPHSABS 1.4 11/08/2018 1041   MONOABS 0.9 02/11/2023 1625   EOSABS 0.0 02/11/2023 1625   EOSABS 0.2 11/08/2018 1041   BASOSABS 0.0 02/11/2023 1625   BASOSABS 0.0 11/08/2018 1041    No results found for: "POCLITH", "LITHIUM"   No results found for: "PHENYTOIN", "PHENOBARB", "VALPROATE", "CBMZ"   .res Assessment: Plan:    38 minutes spent dedicated to the care of this patient on the date of this encounter to include pre-visit review of records, ordering of medication, post visit documentation, and face-to-face time with the patient discussing recent stressors and the effects this has had on her anxiety, as well as how anemia could cause fatigue. Pt reports that overall mood and anxiety symptoms are well controlled and she would like to continue current medications without changes.  Will continue Remeron 30 mg po at bedtime for depression, insomnia, and anxiety.  Continue Abilify 4 mg daily for augmentation of depression.  Continue Buspar 30 mg twice daily for anxiety.  Continue Oxcarbazepine 600 mg at bed time for mood stabilization, anxiety, and insomnia.  Continue Ambien 5-10 mg po at bedtime prn insomnia.  Continue Klonopin 0.5 mg 1/2-1 tab po TID prn anxiety.  Recommend continuing therapy with Rockne Menghini, LCSW.  Pt to follow-up in 3 months or sooner if clinically indicated.  Patient advised to contact office with any questions, adverse effects, or acute worsening in signs and symptoms.   Keylly was seen today for follow-up.  Diagnoses and all orders for this visit:  Bipolar II disorder (HCC) -     ARIPiprazole (ABILIFY) 2 MG tablet; Take 2 tablets (4 mg total) by mouth daily. -     mirtazapine (REMERON) 30 MG tablet; TAKE 1 TABLET BY MOUTH AT  BEDTIME  Anxiety disorder, unspecified type -     busPIRone (BUSPAR) 30 MG tablet; Take 1 tablet (30 mg total) by mouth 2 (two) times daily. -     Oxcarbazepine (TRILEPTAL) 300 MG tablet; Take 2 tablets (600 mg total) by  mouth at bedtime.  Panic disorder -  clonazePAM (KLONOPIN) 0.5 MG tablet; Take 1/2-1 tab po TID prn anxiety. (Do not combine with Ambien)  Primary insomnia -     zolpidem (AMBIEN) 10 MG tablet; Take 0.5-1 tablets (5-10 mg total) by mouth at bedtime as needed. for sleep -     mirtazapine (REMERON) 30 MG tablet; TAKE 1 TABLET BY MOUTH AT  BEDTIME     Please see After Visit Summary for patient specific instructions.  Future Appointments  Date Time Provider Department Center  07/26/2023  9:00 AM [facility]  08/08/2023 10:00 AM Mathis Fare, LCSW [facility] None    No orders of the defined types were placed in this encounter.     -------------------------------

## 2023-07-26 ENCOUNTER — Telehealth: Payer: Self-pay | Admitting: Nurse Practitioner

## 2023-07-26 ENCOUNTER — Ambulatory Visit: Payer: 59 | Attending: Internal Medicine

## 2023-07-26 DIAGNOSIS — Z01810 Encounter for preprocedural cardiovascular examination: Secondary | ICD-10-CM | POA: Diagnosis not present

## 2023-07-26 NOTE — Progress Notes (Signed)
Virtual Visit via Telephone Note   Because of Chelsea Benson's co-morbid illnesses, she is at least at moderate risk for complications without adequate follow up.  This format is felt to be most appropriate for this patient at this time.  The patient did not have access to video technology/had technical difficulties with video requiring transitioning to audio format only (telephone).  All issues noted in this document were discussed and addressed.  No physical exam could be performed with this format.  Please refer to the patient's chart for her consent to telehealth for [facility].  Evaluation Performed:  Preoperative cardiovascular risk assessment _____________   Date:  07/26/2023   Patient ID:  Chelsea Benson, DOB [birth year], MRN [id number] Patient Location:  Home Provider location:   Office  Primary Care Provider:  Jackelyn Poling, DO Primary Cardiologist:  Bryan Lemma, MD  Chief Complaint / Patient Profile   72 y.o. y/o female with a h/o paroxysmal AF (on Eliquis), DM type II, HTN, HLD, asthma, OSA, bipolar disorder, history of PE who is pending colonoscopy and presents today for telephonic preoperative cardiovascular risk assessment.  History of Present Illness    Chelsea Benson is a 72 y.o. female who presents via audio/video conferencing for a telehealth visit today.  Pt was last seen in cardiology clinic on 07/20/2022 by Tereso Newcomer, PA.  At that time Chelsea Benson was doing well with no new cardiac complaints.  She underwent a echo due to cardiomegaly on chest x-ray that showed aorta with recommendation to repeat 2D echo in 1 year.  The patient is now pending procedure as outlined above. Since her last visit, she has been doing well with no new cardiac complaints or concerns.  She denies chest pain, shortness of breath, lower extremity edema, fatigue, palpitations, melena, hematuria, hemoptysis, diaphoresis, weakness, presyncope, syncope, orthopnea, and  PND.    Past Medical History    Past Medical History:  Diagnosis Date   Allergic rhinoconjunctivitis    Anginal pain (HCC)    Anxiety    Arthritis    Spondylolysis, knee and ankle arthritis pains.   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  He uses a walker   Depression    Diabetes mellitus without complication (HCC)    dx in 2007   Diabetes mellitus, type II (HCC)    Dysrhythmia    Eczema    Headache(784.0)    sinus related    History of kidney stones    History of nephrolithiasis    Hx of pulmonary embolus 2017   Hyperlipidemia    Hypertension    Insomnia    Memory change    OSA on CPAP    PSG 05/24/2016: AHI 17/hour 02 mean 76%.  HSAT 06/13/17, AHI 11-hour 02 mean 79%   Paroxysmal atrial fibrillation (HCC) 02/24/2021   CHA2DS2-VASc score 4 (HTN, DM, female, age-59); on Eliquis   Pneumonia    Tremor of both hands    Past Surgical History:  Procedure Laterality Date   ABDOMINAL HYSTERECTOMY     BACK SURGERY     2012- lumbar fusion   BREAST SURGERY     L cyst  removed - 1972   BUNIONECTOMY Left    Great toe   calf -R- cyst removed     CARPAL TUNNEL RELEASE     both hands    CATARACT EXTRACTION     CORONARY CT ANGIOGRAM  07/17/2019    Coronary calcium score 0.  Right dominant system.  Difficult to assess because of cardiac motion, but there is no obvious evidence of CAD.  Mild PA dilation.    HAMMER TOE SURGERY Left    L foot   NASAL SINUS SURGERY     NM MYOVIEW LTD  12/2008   Normal study.  Normal LV function.  EF 61%.  Apical thinning but no ischemia or infarction.   SHOULDER ARTHROSCOPY Left    R shoulder- RCR   TOE SURGERY     TOTAL KNEE ARTHROPLASTY Right 05/22/2017   Procedure: TOTAL KNEE  ARTHROPLASTY;  Surgeon: Gean Birchwood, MD;  Location: [facility] OR;  Service: Orthopedics;  Laterality: Right;   TRANSTHORACIC ECHOCARDIOGRAM  06/2009    Technically difficult.  Moderate concentric LVH with normal LV function.  No regional wall motion normalities.  EF> 55%.  Normal right ventricle.  No valve lesions.  Normal filling pressures.   TRIGGER FINGER RELEASE     L thumb   TUBAL LIGATION      Allergies  Allergies  Allergen Reactions   Ramipril Other (See Comments)   Cymbalta [Duloxetine Hcl] Other (See Comments)    Caused hair loss   Methylprednisolone Acetate Hives and Rash    05/29/20 developed hives "all over my back" after MBB; resolved with Benadryl and "a few days."  Can tolerate Dexamethasone/Decadron. 05/29/20 developed hives "all over my back" after MBB; resolved with Benadryl and "a few days."  Can tolerate Dexamethasone/Decadron.   Rosuvastatin Calcium Other (See Comments) and Cough    Rapid heart rate   Almond Mea Itching and Swelling    Tongue swells and itches   Almond Oil Itching and Swelling    Itching and swelling of tongue    Carvedilol Cough   Fish Allergy Itching    Halibut fish   Losartan Potassium Cough   Morphine And Codeine Itching    Home Medications    Prior to Admission medications   Medication Sig Start Date End Date Taking? Authorizing Provider  ACCU-CHEK GUIDE test strip TEST BID PRN 09/17/18   [provider]  albuterol (PROVENTIL) (2.5 MG/3ML) 0.083% nebulizer solution USE 1 VIAL IN NEBULIZER EVERY 4 HOURS - and as needed 05/25/23   Marcelyn Bruins, MD  albuterol (VENTOLIN HFA) 108 (90 Base) MCG/ACT inhaler USE 2 INHALATIONS BY MOUTH EVERY 4 HOURS AS NEEDED FOR WHEEZING  OR SHORTNESS OF BREATH 05/25/23   Marcelyn Bruins, MD  apixaban (ELIQUIS) 5 MG TABS tablet Take 1 tablet (5 mg total) by mouth 2 (two) times daily. 02/21/23   Marykay Lex, MD  ARIPiprazole (ABILIFY) 2 MG tablet Take 2 tablets (4 mg total) by mouth  daily. 07/25/23   Corie Chiquito, PMHNP  azelastine (ASTELIN) 0.1 % nasal spray Place 2 sprays into both nostrils 2 (two) times daily. 05/25/23   Marcelyn Bruins, MD  Bepotastine Besilate 1.5 % SOLN 1 drop each eye daily as needed for itchy, watery eyes. 07/17/23   Marcelyn Bruins, MD  Blood Glucose Monitoring Suppl (ACCU-CHEK GUIDE) w/Device KIT See admin instructions. 09/18/18   [provider]  budesonide-formoterol (SYMBICORT) 160-4.5 MCG/ACT inhaler INHALE 2 INHALATIONS BY MOUTH  INTO THE LUNGS IN  THE MORNING  AND AT BEDTIME 05/25/23   Marcelyn Bruins, MD  busPIRone (BUSPAR) 30 MG tablet Take 1 tablet (30 mg total) by mouth 2 (two) times daily. 07/25/23 10/23/23  Corie Chiquito, PMHNP  CALCIUM-VITAMIN D PO Take 1 tablet by mouth daily.    [provider]  Carbinoxamine Maleate 4 MG TABS Take 1 tablet (4 mg total) by mouth 3 (three) times daily as needed. 08/09/22   Hetty Blend, FNP  clonazePAM (KLONOPIN) 0.5 MG tablet Take 1/2-1 tab po TID prn anxiety. (Do not combine with Ambien) 07/25/23   Corie Chiquito, PMHNP  desonide (DESOWEN) 0.05 % ointment APPLY TO AFFECTED AREA(S)  TOPICALLY TWICE DAILY AS  NEEDED 05/25/23   Marcelyn Bruins, MD  diltiazem (CARDIZEM CD) 300 MG 24 hr capsule Take 1 capsule (300 mg total) by mouth daily. 12/28/22   Marykay Lex, MD  diltiazem (CARDIZEM) 60 MG tablet TAKE 1 TABLET BY MOUTH EVERY 6 HOURS AS NEEDED FOR PALPITATION EDISODE. UP TO 2 TABLETS PER DAY 04/04/23   Marykay Lex, MD  EPINEPHrine 0.3 mg/0.3 mL IJ SOAJ injection INJECT INTRAMUSCULARLY 1  PEN AS NEEDED FOR ALLERGIC  RESPONSE AS DIRECTED BY MD. Assunta Found MEDICAL HELP AFTER  USE. 05/25/23   Marcelyn Bruins, MD  famotidine (PEPCID) 20 MG tablet TAKE 1 TABLET BY MOUTH DAILY  AFTER SUPPER 05/25/23   Marcelyn Bruins, MD  ferrous sulfate 325 (65 FE) MG tablet Take 325 mg by mouth daily with breakfast.    [provider]   fexofenadine (ALLERGY RELIEF) 180 MG tablet TAKE 1 TABLET(180 MG) BY MOUTH DAILY 05/25/23   Marcelyn Bruins, MD  fluticasone [facility]) 50 MCG/ACT nasal spray USE 2 SPRAYS IN BOTH NOSTRILS  DAILY 05/25/23   Marcelyn Bruins, MD  fluticasone (FLOVENT HFA) 110 MCG/ACT inhaler Inhale 4 puffs twice a day with a spacer for the next 2 weeks or until cough and wheeze free 05/25/23   Marcelyn Bruins, MD  Fluticasone Furoate (ARNUITY ELLIPTA) 100 MCG/ACT AEPB Inhale 1 puff into the lungs daily as needed. 05/26/23   Marcelyn Bruins, MD  hydrochlorothiazide (HYDRODIURIL) 25 MG tablet TAKE 1 TABLET BY MOUTH DAILY 09/19/22   Marykay Lex, MD  ipratropium (ATROVENT) 0.06 % nasal spray Place 2 sprays into both nostrils 4 (four) times daily as needed for rhinitis. 05/25/23   Marcelyn Bruins, MD  Lancets [facility] DELICA PLUS [place]) MISC SMARTSIG:1 Topical Daily 03/20/23   [provider]  meloxicam (MOBIC) 7.5 MG tablet Take 1 tablet (7.5 mg total) by mouth daily. 12/10/21   Tomi Bamberger, PA-C  Mepolizumab (NUCALA) 100 MG/ML SOAJ Inject 1 mL (100 mg total) into the skin every 28 (twenty-eight) days. 05/29/23   Marcelyn Bruins, MD  metFORMIN (GLUCOPHAGE-XR) 500 MG 24 hr tablet Take 500 mg by mouth in the morning and at bedtime. 07/02/19   [provider]  methocarbamol (ROBAXIN) 500 MG tablet Take 500 mg by mouth as needed for muscle spasms.    [provider]  mirtazapine (REMERON) 30 MG tablet TAKE 1 TABLET BY MOUTH AT  BEDTIME 07/25/23   Corie Chiquito, PMHNP  montelukast (SINGULAIR) 10 MG tablet Take 1 tablet (10 mg total) by mouth at bedtime. 05/25/23   Marcelyn Bruins, MD  omeprazole (PRILOSEC) 20 MG capsule omeprazole 20 mg capsule,delayed release TK ONE C PO  BID 11/03/21   [provider]  Oxcarbazepine (TRILEPTAL) 300 MG tablet Take 2 tablets (  600 mg total) by mouth at bedtime. 07/25/23   Corie Chiquito,  PMHNP  oxybutynin (DITROPAN-XL) 10 MG 24 hr tablet Take 10 mg by mouth daily. 12/17/21   [provider]  potassium chloride SA (KLOR-CON M) 20 MEQ tablet TAKE 1 TABLET BY MOUTH DAILY 06/30/23   Marykay Lex, MD  pravastatin (PRAVACHOL) 20 MG tablet Take 1 tablet (20 mg total) by mouth at bedtime. 09/29/21   Marykay Lex, MD  pregabalin (LYRICA) 100 MG capsule Take 100 mg by mouth 3 (three) times daily.    [provider]  Respiratory Therapy Supplies (NEBULIZER) DEVI Use as directed with nebulizer solution. 06/01/18   Alfonse Spruce, MD  Respiratory Therapy Supplies (NEBULIZER/TUBING/MOUTHPIECE) KIT Use as directed with nebulizer machine 04/17/20   Padgett, Pilar Grammes, MD  Semaglutide, 1 MG/DOSE, (OZEMPIC, 1 MG/DOSE,) 2 MG/1.5ML SOPN Take 1 mg by mouth once a week.    [provider]  tiZANidine (ZANAFLEX) 2 MG tablet     [provider]  triamcinolone (NASACORT) 55 MCG/ACT AERO nasal inhaler Place 2 sprays into the nose daily. 05/25/23   Marcelyn Bruins, MD  zolpidem (AMBIEN) 10 MG tablet Take 0.5-1 tablets (5-10 mg total) by mouth at bedtime as needed. for sleep 07/25/23   Corie Chiquito, PMHNP    Physical Exam    Vital Signs:  Chelsea Benson does not have vital signs available for review today.121/71  Given telephonic nature of communication, physical exam is limited. AAOx3. NAD. Normal affect.  Speech and respirations are unlabored.  Accessory Clinical Findings    None  Assessment & Plan    1.  Preoperative Cardiovascular Risk Assessment:  -Patient's RCRI score is 0.9%  The patient affirms she has been doing well without any new cardiac symptoms. They are able to achieve 4 METS without cardiac limitations. Therefore, based on ACC/AHA guidelines, the patient would be at acceptable risk for the planned procedure without further cardiovascular testing. The patient was advised that if she develops new symptoms prior to  surgery to contact our office to arrange for a follow-up visit, and she verbalized understanding.   The patient was advised that if she develops new symptoms prior to surgery to contact our office to arrange for a follow-up visit, and she verbalized understanding.  Patient can hold Eliquis 2 days prior to procedure and should restart postprocedure when surgically safe.  A copy of this note will be routed to requesting surgeon.  Time:   Today, I have spent 6 minutes with the patient with telehealth technology discussing medical history, symptoms, and management plan.     Napoleon Form, Leodis Rains, NP  07/26/2023, 6:58 AM

## 2023-07-26 NOTE — Telephone Encounter (Signed)
Preop clearance call placed today at 9 AM for scheduled clearance visit however patient's phone went straight to voicemail.  Detailed message left for patient to return call at earliest convenience.  Robin Searing, NP

## 2023-07-31 DIAGNOSIS — G4733 Obstructive sleep apnea (adult) (pediatric): Secondary | ICD-10-CM | POA: Diagnosis not present

## 2023-08-01 DIAGNOSIS — K635 Polyp of colon: Secondary | ICD-10-CM | POA: Diagnosis not present

## 2023-08-01 DIAGNOSIS — D509 Iron deficiency anemia, unspecified: Secondary | ICD-10-CM | POA: Diagnosis not present

## 2023-08-01 DIAGNOSIS — K2930 Chronic superficial gastritis without bleeding: Secondary | ICD-10-CM | POA: Diagnosis not present

## 2023-08-01 DIAGNOSIS — R195 Other fecal abnormalities: Secondary | ICD-10-CM | POA: Diagnosis not present

## 2023-08-01 DIAGNOSIS — K5730 Diverticulosis of large intestine without perforation or abscess without bleeding: Secondary | ICD-10-CM | POA: Diagnosis not present

## 2023-08-01 DIAGNOSIS — K648 Other hemorrhoids: Secondary | ICD-10-CM | POA: Diagnosis not present

## 2023-08-03 ENCOUNTER — Telehealth: Payer: Self-pay | Admitting: Allergy

## 2023-08-03 DIAGNOSIS — K2930 Chronic superficial gastritis without bleeding: Secondary | ICD-10-CM | POA: Diagnosis not present

## 2023-08-03 DIAGNOSIS — K635 Polyp of colon: Secondary | ICD-10-CM | POA: Diagnosis not present

## 2023-08-03 NOTE — Telephone Encounter (Signed)
Patient called stating she is needing [place] to send a prescription of Nucala to [facility] so she can get her Nucala.

## 2023-08-03 NOTE — Telephone Encounter (Signed)
L/m for patient refill sent to [facility] 05/29/23 and should reach out to [facility] number provided

## 2023-08-04 MED ORDER — NUCALA 100 MG/ML ~~LOC~~ SOAJ: 100.0000 mg | SUBCUTANEOUS | 11 refills | Status: DC

## 2023-08-04 NOTE — Telephone Encounter (Signed)
Pt would like a call back, she states she called [facility] and they have no refills for her.

## 2023-08-04 NOTE — Telephone Encounter (Signed)
Patient called back. Advised her to reach out to [facility] per Tammy's message.

## 2023-08-04 NOTE — Telephone Encounter (Signed)
L/m for patient that [facility] did receive Nucala rx on 05/29/23 but patient called in and advised she was going to get medication from a local pharmacy so they d/c same. I have sent another rx to them and advised patient to give it a couple days and reach out to them to order same

## 2023-08-08 ENCOUNTER — Ambulatory Visit (INDEPENDENT_AMBULATORY_CARE_PROVIDER_SITE_OTHER): Payer: 59 | Admitting: Psychiatry

## 2023-08-08 DIAGNOSIS — F419 Anxiety disorder, unspecified: Secondary | ICD-10-CM
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
## 2023-08-16 DIAGNOSIS — M545 Low back pain, unspecified: Secondary | ICD-10-CM | POA: Diagnosis not present

## 2023-08-16 DIAGNOSIS — I10 Essential (primary) hypertension: Secondary | ICD-10-CM | POA: Diagnosis not present

## 2023-08-17 DIAGNOSIS — M47816 Spondylosis without myelopathy or radiculopathy, lumbar region: Secondary | ICD-10-CM | POA: Diagnosis not present

## 2023-08-17 DIAGNOSIS — M62838 Other muscle spasm: Secondary | ICD-10-CM | POA: Diagnosis not present

## 2023-08-17 DIAGNOSIS — G894 Chronic pain syndrome: Secondary | ICD-10-CM | POA: Diagnosis not present

## 2023-08-25 ENCOUNTER — Telehealth: Payer: Self-pay | Admitting: Psychiatry

## 2023-08-25 NOTE — Telephone Encounter (Signed)
Chelsea Benson called at 10:00 to report that she is not sleeping.  She picked up her generic ambien but the pill is different,  It is smaller and white.  Not the pink she used to get.  She is going to sleep around 12:30-1 and up again at 2am, then 4am then 6am.  Please call to discuss or advice what to do.  Pharmacy is [facility] on [address]

## 2023-08-25 NOTE — Telephone Encounter (Signed)
Patient notified. Told her not to take with the Ambien.

## 2023-08-25 NOTE — Telephone Encounter (Signed)
Chelsea Benson's patient. She is reporting not sleeping. She has been on 10 mg zolpidem for a long time, has been getting a pink pill thru [facility]. It looks like this is from [employer] She got last fill at local [facility] and it is [place].  She is also on clonazepam 0.5 1/2 to 1 tab TID for anxiety. Can she take extra clonazepam to get her thru the weekend? She is 72.

## 2023-08-25 NOTE — Telephone Encounter (Signed)
She can take clonazepam 0.75 mg and at most 1 mg through weekend only and then discuss with provider

## 2023-08-28 ENCOUNTER — Other Ambulatory Visit: Payer: Self-pay

## 2023-08-28 DIAGNOSIS — F5101 Primary insomnia: Secondary | ICD-10-CM

## 2023-08-28 MED ORDER — ZOLPIDEM TARTRATE 10 MG PO TABS: 5.0000 mg | ORAL_TABLET | Freq: Every evening | ORAL | 0 refills | Status: DC | PRN

## 2023-08-28 NOTE — Telephone Encounter (Signed)
Patient reported she didn't take any clonazepam during the day and took 1 mg at bedtime. She reports going to bed at 8:30, was up at 11:00 PM, but was able to go back to sleep until 5 AM.

## 2023-08-28 NOTE — Telephone Encounter (Signed)
Patient has called several times today to report not sleeping. She said she is very sleepy now. I advised her not to take any more clonazepam until she heard back from [place].

## 2023-08-28 NOTE — Telephone Encounter (Signed)
Please call pt to follow-up on how she is doing taking additional Klonopin and not taking Ambien.

## 2023-08-28 NOTE — Telephone Encounter (Signed)
Addendum to attached message. Edy is having trouble sleeping at night, different times of the night. The Clonazepam is not helping. States Ambien is made by a different manufacturer which is different in color and size. Could someone please call her at [phone].

## 2023-08-28 NOTE — Telephone Encounter (Signed)
Rx had been sent and patient notified. Told her she could resume her normal dosage of clonazepam.

## 2023-08-29 ENCOUNTER — Other Ambulatory Visit: Payer: Self-pay

## 2023-08-30 ENCOUNTER — Ambulatory Visit: Payer: 59 | Admitting: Psychiatry

## 2023-08-30 DIAGNOSIS — F419 Anxiety disorder, unspecified: Secondary | ICD-10-CM | POA: Diagnosis not present
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
## 2023-09-16 DIAGNOSIS — G894 Chronic pain syndrome: Secondary | ICD-10-CM | POA: Diagnosis not present

## 2023-09-16 DIAGNOSIS — M62838 Other muscle spasm: Secondary | ICD-10-CM | POA: Diagnosis not present

## 2023-09-16 DIAGNOSIS — M47816 Spondylosis without myelopathy or radiculopathy, lumbar region: Secondary | ICD-10-CM | POA: Diagnosis not present

## 2023-09-20 ENCOUNTER — Other Ambulatory Visit: Payer: Self-pay

## 2023-09-20 ENCOUNTER — Ambulatory Visit: Payer: 59 | Admitting: Psychiatry

## 2023-09-20 DIAGNOSIS — F419 Anxiety disorder, unspecified: Secondary | ICD-10-CM

## 2023-09-20 DIAGNOSIS — I480 Paroxysmal atrial fibrillation: Secondary | ICD-10-CM

## 2023-09-20 MED ORDER — HYDROCHLOROTHIAZIDE 25 MG PO TABS: 25.0000 mg | ORAL_TABLET | Freq: Every day | ORAL | 1 refills | Status: DC

## 2023-09-20 MED ORDER — APIXABAN 5 MG PO TABS
5.0000 mg | ORAL_TABLET | Freq: Two times a day (BID) | ORAL | 0 refills | Status: DC
Start: 2023-09-20 — End: 2023-12-29

## 2023-09-20 MED ORDER — DILTIAZEM HCL ER COATED BEADS 300 MG PO CP24: 300.0000 mg | ORAL_CAPSULE | Freq: Every day | ORAL | 1 refills | Status: DC

## 2023-09-20 MED ORDER — POTASSIUM CHLORIDE CRYS ER 20 MEQ PO TBCR: 20.0000 meq | EXTENDED_RELEASE_TABLET | Freq: Every day | ORAL | 1 refills | Status: DC

## 2023-09-20 NOTE — Telephone Encounter (Signed)
Prescription refill request for Eliquis received. Indication: Afib  Last office visit: 07/20/22 Alben Spittle)  Scr: 0.66 (02/11/23)  Age: 72 Weight: 78.9kg  Office visit overdue. Pt has appt on 11/01/23 with Dr Herbie Baltimore. Refill sent to prevent missed doses.
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
## 2023-09-21 ENCOUNTER — Telehealth: Payer: Self-pay | Admitting: Allergy

## 2023-09-21 NOTE — Telephone Encounter (Signed)
[facility] request refills for Epi- Pen, albuterol, arnuity, albuterol inhaler, desonide, prednisone, ipratropium, flonase, bepostastine, fax [phone]

## 2023-09-21 NOTE — Telephone Encounter (Signed)
Pt is over due for an appt, once hse makes an appt I will send in the meds

## 2023-09-27 ENCOUNTER — Other Ambulatory Visit: Payer: Self-pay

## 2023-09-27 ENCOUNTER — Telehealth: Payer: Self-pay | Admitting: Psychiatry

## 2023-09-27 DIAGNOSIS — F5101 Primary insomnia: Secondary | ICD-10-CM

## 2023-09-27 DIAGNOSIS — F419 Anxiety disorder, unspecified: Secondary | ICD-10-CM

## 2023-09-27 DIAGNOSIS — F410 Panic disorder [episodic paroxysmal anxiety] without agoraphobia: Secondary | ICD-10-CM

## 2023-09-27 DIAGNOSIS — F3181 Bipolar II disorder: Secondary | ICD-10-CM

## 2023-09-27 MED ORDER — OXCARBAZEPINE 300 MG PO TABS: 600.0000 mg | ORAL_TABLET | Freq: Every day | ORAL | 0 refills | Status: DC

## 2023-09-27 MED ORDER — ZOLPIDEM TARTRATE 10 MG PO TABS: 5.0000 mg | ORAL_TABLET | Freq: Every evening | ORAL | 0 refills | Status: DC | PRN

## 2023-09-27 MED ORDER — CLONAZEPAM 0.5 MG PO TABS: ORAL_TABLET | ORAL | 0 refills | Status: DC

## 2023-09-27 MED ORDER — MIRTAZAPINE 30 MG PO TABS: ORAL_TABLET | ORAL | 0 refills | Status: DC

## 2023-09-27 MED ORDER — BUSPIRONE HCL 30 MG PO TABS
30.0000 mg | ORAL_TABLET | Freq: Two times a day (BID) | ORAL | 0 refills | Status: DC
Start: 2023-09-27 — End: 2023-10-25

## 2023-09-27 MED ORDER — ARIPIPRAZOLE 2 MG PO TABS
4.0000 mg | ORAL_TABLET | Freq: Every day | ORAL | 0 refills | Status: DC
Start: 2023-09-27 — End: 2023-10-25

## 2023-09-27 NOTE — Telephone Encounter (Signed)
PENDED

## 2023-09-27 NOTE — Telephone Encounter (Signed)
LVM RC . PT's medication that Corie Chiquito prescribed are as follows: Abilify 2 mg BID; Buspar 30 mg BID; Klonopin 0.5 mg PRN; Remeron 30 mg 1 time daily; Trileptal 300 mg BID and Ambien 10 mg 0.5-1 tablet daily.

## 2023-09-27 NOTE — Telephone Encounter (Signed)
Pt lvm that she is no longer using  [facility] and now all of her prescriptions  to [facility]. She wants a list of meds that jessica prescribed. The number to the pharmacy is [phone]

## 2023-09-27 NOTE — Telephone Encounter (Signed)
Chelsea Benson called back in. The message is not clear. We need to send all her psychiatric meds that [facility] in to the new pharmacy - [facility] at phone #[phone].

## 2023-09-28 ENCOUNTER — Telehealth: Payer: Self-pay | Admitting: Allergy

## 2023-09-28 NOTE — Telephone Encounter (Signed)
Patient called stating she needs to change her pharmacy from [facility] to a mail order pharmacy which is [facility].

## 2023-09-28 NOTE — Telephone Encounter (Signed)
[facility] is listed as pharmacy on pts chart

## 2023-10-02 ENCOUNTER — Telehealth: Payer: Self-pay | Admitting: Allergy

## 2023-10-02 MED ORDER — AZELASTINE HCL 0.1 % NA SOLN: 2.0000 | Freq: Two times a day (BID) | NASAL | 1 refills | Status: DC

## 2023-10-02 MED ORDER — MONTELUKAST SODIUM 10 MG PO TABS: 10.0000 mg | ORAL_TABLET | Freq: Every day | ORAL | 2 refills | Status: DC

## 2023-10-02 MED ORDER — FLUTICASONE PROPIONATE 50 MCG/ACT NA SUSP: NASAL | 1 refills | Status: DC

## 2023-10-02 MED ORDER — FEXOFENADINE HCL 180 MG PO TABS: 180.0000 mg | ORAL_TABLET | Freq: Every day | ORAL | 5 refills | Status: DC

## 2023-10-02 MED ORDER — ARNUITY ELLIPTA 100 MCG/ACT IN AEPB: 1.0000 | INHALATION_SPRAY | Freq: Every day | RESPIRATORY_TRACT | 5 refills | Status: DC | PRN

## 2023-10-02 MED ORDER — BUDESONIDE-FORMOTEROL FUMARATE 160-4.5 MCG/ACT IN AERO: INHALATION_SPRAY | RESPIRATORY_TRACT | 5 refills | Status: DC

## 2023-10-02 MED ORDER — BEPOTASTINE BESILATE 1.5 % OP SOLN: 1.0000 [drp] | Freq: Every day | OPHTHALMIC | 5 refills | Status: DC | PRN

## 2023-10-02 NOTE — Telephone Encounter (Signed)
Patient stated she wanted her pharmacy changed from [facility] on [address] to [facility]. She stated she needed the prescriptions fax as soon as possible.  Best Contact: [phone]

## 2023-10-02 NOTE — Telephone Encounter (Signed)
Can you please call pt and schedule her an office visit and find out exactly what meds she needs a curtesy refill on she was suppose to be seen in June this year

## 2023-10-02 NOTE — Addendum Note (Signed)
Addended by: Berna Bue on: 10/02/2023 01:35 PM   Modules accepted: Orders

## 2023-10-02 NOTE — Telephone Encounter (Signed)
Sent in meds 

## 2023-10-02 NOTE — Telephone Encounter (Signed)
I do see that now ill send in refill

## 2023-10-03 ENCOUNTER — Other Ambulatory Visit: Payer: Self-pay | Admitting: Psychiatry

## 2023-10-03 DIAGNOSIS — F419 Anxiety disorder, unspecified: Secondary | ICD-10-CM

## 2023-10-05 ENCOUNTER — Telehealth: Payer: Self-pay | Admitting: Allergy

## 2023-10-05 NOTE — Telephone Encounter (Signed)
Called patient - DOB verified - advised all medication refill prescriptions were electronically sent on 10/02/23 to [facility].  Patient stated she did contact [facility] advising of the above notation as well - they confirmed the prescriptions have been received - she did inquire why they keep sending notifications that the prescriptions have not been received - rep advised they didn't know but confirmed all prescriptions have been received and been processed.  Patient verbalized understanding, no further questions.

## 2023-10-05 NOTE — Telephone Encounter (Signed)
Patient states medications was sent to [facility] but the pharmacy is stating they do not have these prescriptions to fill patient asking for these to be resent or have the nurse call her to clarify they was sent

## 2023-10-16 ENCOUNTER — Telehealth: Payer: Self-pay | Admitting: Psychiatry

## 2023-10-16 DIAGNOSIS — F419 Anxiety disorder, unspecified: Secondary | ICD-10-CM

## 2023-10-16 MED ORDER — OXCARBAZEPINE 300 MG PO TABS: 600.0000 mg | ORAL_TABLET | Freq: Every day | ORAL | 0 refills | Status: DC

## 2023-10-16 NOTE — Telephone Encounter (Signed)
[facility] LVM @ 11/8 @ 3:31p requesting refill for Oxcabpazepine.     Next appt 11/20

## 2023-10-16 NOTE — Telephone Encounter (Signed)
Sent!

## 2023-10-17 DIAGNOSIS — M62838 Other muscle spasm: Secondary | ICD-10-CM | POA: Diagnosis not present

## 2023-10-17 DIAGNOSIS — M47816 Spondylosis without myelopathy or radiculopathy, lumbar region: Secondary | ICD-10-CM | POA: Diagnosis not present

## 2023-10-17 DIAGNOSIS — G894 Chronic pain syndrome: Secondary | ICD-10-CM | POA: Diagnosis not present

## 2023-10-17 MED ORDER — FLUTICASONE PROPIONATE 50 MCG/ACT NA SUSP: NASAL | 1 refills | Status: DC

## 2023-10-17 MED ORDER — AZELASTINE HCL 0.1 % NA SOLN: 2.0000 | Freq: Two times a day (BID) | NASAL | 1 refills | Status: DC

## 2023-10-17 MED ORDER — IPRATROPIUM BROMIDE 0.06 % NA SOLN: 2.0000 | Freq: Four times a day (QID) | NASAL | 5 refills | Status: DC | PRN

## 2023-10-17 NOTE — Addendum Note (Signed)
Addended by: Berna Bue on: 10/17/2023 10:32 AM   Modules accepted: Orders

## 2023-10-17 NOTE — Telephone Encounter (Signed)
Patient called stating she needs [place] to send all her prescriptions to [facility]. Patient states they have the medications on file but does not have the prescriptions for them. Patient states she is running out of her nasal sprays such as Atrovent, Fluticasone, Azelastine and needs the prescriptions sent to [facility].

## 2023-10-17 NOTE — Telephone Encounter (Signed)
Azelastine flonase and atrovent have all been sent into [facility]

## 2023-10-18 ENCOUNTER — Encounter: Payer: Self-pay | Admitting: Psychiatry

## 2023-10-18 DIAGNOSIS — E1136 Type 2 diabetes mellitus with diabetic cataract: Secondary | ICD-10-CM | POA: Diagnosis not present

## 2023-10-18 DIAGNOSIS — E785 Hyperlipidemia, unspecified: Secondary | ICD-10-CM | POA: Diagnosis not present

## 2023-10-18 DIAGNOSIS — Z0000 Encounter for general adult medical examination without abnormal findings: Secondary | ICD-10-CM | POA: Diagnosis not present

## 2023-10-18 DIAGNOSIS — I480 Paroxysmal atrial fibrillation: Secondary | ICD-10-CM | POA: Diagnosis not present

## 2023-10-18 DIAGNOSIS — D509 Iron deficiency anemia, unspecified: Secondary | ICD-10-CM | POA: Diagnosis not present

## 2023-10-18 DIAGNOSIS — Z79899 Other long term (current) drug therapy: Secondary | ICD-10-CM | POA: Diagnosis not present

## 2023-10-24 ENCOUNTER — Ambulatory Visit: Payer: 59 | Admitting: Psychiatry

## 2023-10-24 DIAGNOSIS — F419 Anxiety disorder, unspecified: Secondary | ICD-10-CM | POA: Diagnosis not present
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
## 2023-10-25 ENCOUNTER — Encounter: Payer: Self-pay | Admitting: Psychiatry

## 2023-10-25 ENCOUNTER — Telehealth (INDEPENDENT_AMBULATORY_CARE_PROVIDER_SITE_OTHER): Payer: 59 | Admitting: Psychiatry

## 2023-10-25 DIAGNOSIS — F5101 Primary insomnia: Secondary | ICD-10-CM | POA: Diagnosis not present

## 2023-10-25 DIAGNOSIS — F410 Panic disorder [episodic paroxysmal anxiety] without agoraphobia: Secondary | ICD-10-CM | POA: Diagnosis not present

## 2023-10-25 DIAGNOSIS — F419 Anxiety disorder, unspecified: Secondary | ICD-10-CM | POA: Diagnosis not present

## 2023-10-25 DIAGNOSIS — F3181 Bipolar II disorder: Secondary | ICD-10-CM | POA: Diagnosis not present

## 2023-10-25 MED ORDER — ZOLPIDEM TARTRATE 10 MG PO TABS: 5.0000 mg | ORAL_TABLET | Freq: Every evening | ORAL | 5 refills | Status: DC | PRN

## 2023-10-25 MED ORDER — ARIPIPRAZOLE 2 MG PO TABS: 4.0000 mg | ORAL_TABLET | Freq: Every day | ORAL | 5 refills | Status: DC

## 2023-10-25 MED ORDER — MIRTAZAPINE 30 MG PO TABS: ORAL_TABLET | ORAL | 5 refills | Status: DC

## 2023-10-25 MED ORDER — OXCARBAZEPINE 300 MG PO TABS: 600.0000 mg | ORAL_TABLET | Freq: Every day | ORAL | 5 refills | Status: DC

## 2023-10-25 MED ORDER — CLONAZEPAM 0.5 MG PO TABS: ORAL_TABLET | ORAL | 5 refills | Status: DC

## 2023-10-25 MED ORDER — BUSPIRONE HCL 30 MG PO TABS
30.0000 mg | ORAL_TABLET | Freq: Two times a day (BID) | ORAL | 5 refills | Status: DC
Start: 2023-10-25 — End: 2024-02-28

## 2023-10-25 NOTE — Progress Notes (Signed)
Chelsea Benson [id number] 13-May-1951 72 y.o.  Virtual Visit via Video Note  I connected with pt @ on 10/25/23 at  9:00 AM EST by a video enabled telemedicine application and verified that I am speaking with the correct person using two identifiers.   I discussed the limitations of evaluation and management by telemedicine and the availability of in person appointments. The patient expressed understanding and agreed to proceed.  I discussed the assessment and treatment plan with the patient. The patient was provided an opportunity to ask questions and all were answered. The patient agreed with the plan and demonstrated an understanding of the instructions.   The patient was advised to call back or seek an in-person evaluation if the symptoms worsen or if the condition fails to improve as anticipated.  I provided 27 minutes of non-face-to-face time during this encounter.  The patient was located at home.  The provider was located at [facility].   Chelsea Benson, PMHNP   Subjective:   Patient ID:  Chelsea Benson is a 72 y.o. (DOB 10/01/1951) female.  Chief Complaint:  Chief Complaint  Patient presents with   Anxiety    HPI Chelsea Benson presents for follow-up of anxiety, mood disturbance, and insomnia.  She reports that she was not able to sleep last night due to back pain. She reports that she has been sleeping ok overall. "It's been ok, but I still have anxiety." She reports that medication helps with anxiety and she will "start feeling weird" if she misses medications. Denies any panic attacks. Denies depression. She reports, "I get irritable sometimes." She will sometimes scream to release tension. Denies impulsive or risky behavior. She reports that her energy and motivation have been good with the upcoming holidays. Appetite is good. She reports adequate concentration. Denies SI.   Some worry about daughter who is working two jobs and dealing with child  support/custody issues. Oldest daughter is continuing to grieve the loss of her father. They are planning a celebration of life service. Oldest daughter is working 3 jobs and has a son with schizophrenia.   Friend across the street recently fell and sustained fractures and laid on her floor for 7 hours before she was able to get help. Friend is in rehab facility now.   Klonopin last filled 09/29/23.  Ambien last filled 08/29/23.   Past Psychiatric Medication Trials: Cymbalta-hair loss Mirtazapine Prozac Trazodone-ineffective Ambien Belsomra BuSpar Hydroxyzine Abilify Trileptal Xanax  Review of Systems:  Review of Systems  Musculoskeletal:  Positive for back pain and gait problem.  Neurological:  Negative for tremors.  Psychiatric/Behavioral:         Please refer to HPI  She reports that she had recent annual physical and this was normal.   Medications: I have reviewed the patient's current medications.  Current Outpatient Medications  Medication Sig Dispense Refill   GEMTESA 75 MG TABS Take 1 tablet by mouth daily.     methocarbamol (ROBAXIN) 500 MG tablet Take 500 mg by mouth as needed for muscle spasms.     ACCU-CHEK GUIDE test strip TEST BID PRN  1   albuterol (PROVENTIL) (2.5 MG/3ML) 0.083% nebulizer solution USE 1 VIAL IN NEBULIZER EVERY 4 HOURS - and as needed 225 mL 1   albuterol (VENTOLIN HFA) 108 (90 Base) MCG/ACT inhaler USE 2 INHALATIONS BY MOUTH EVERY 4 HOURS AS NEEDED FOR WHEEZING  OR SHORTNESS OF BREATH 40.2 g 2   apixaban (ELIQUIS) 5 MG TABS tablet Take 1 tablet (  5 mg total) by mouth 2 (two) times daily. 200 tablet 0   ARIPiprazole (ABILIFY) 2 MG tablet Take 2 tablets (4 mg total) by mouth daily. 60 tablet 5   azelastine (ASTELIN) 0.1 % nasal spray Place 2 sprays into both nostrils 2 (two) times daily. 90 mL 1   Bepotastine Besilate (BEPREVE) 1.5 % SOLN Place 1 drop into both eyes daily as needed. 10 mL 5   Bepotastine Besilate 1.5 % SOLN 1 drop each eye daily  as needed for itchy, watery eyes. 90 mL 1   Blood Glucose Monitoring Suppl (ACCU-CHEK GUIDE) w/Device KIT See admin instructions.  1   budesonide-formoterol (SYMBICORT) 160-4.5 MCG/ACT inhaler INHALE 2 INHALATIONS BY MOUTH  INTO THE LUNGS IN THE MORNING  AND AT BEDTIME 10.2 g 5   busPIRone (BUSPAR) 30 MG tablet Take 1 tablet (30 mg total) by mouth 2 (two) times daily. 60 tablet 5   CALCIUM-VITAMIN D PO Take 1 tablet by mouth daily.     Carbinoxamine Maleate 4 MG TABS Take 1 tablet (4 mg total) by mouth 3 (three) times daily as needed. 180 tablet 1   [START ON 10/27/2023] clonazePAM (KLONOPIN) 0.5 MG tablet Take 1/2-1 tab po TID prn anxiety. (Do not combine with Ambien) 45 tablet 5   desonide (DESOWEN) 0.05 % ointment APPLY TO AFFECTED AREA(S)  TOPICALLY TWICE DAILY AS  NEEDED 180 g 1   diltiazem (CARDIZEM CD) 300 MG 24 hr capsule Take 1 capsule (300 mg total) by mouth daily. 30 capsule 1   diltiazem (CARDIZEM) 60 MG tablet TAKE 1 TABLET BY MOUTH EVERY 6 HOURS AS NEEDED FOR PALPITATION EDISODE. UP TO 2 TABLETS PER DAY 60 tablet 7   EPINEPHrine 0.3 mg/0.3 mL IJ SOAJ injection INJECT INTRAMUSCULARLY 1  PEN AS NEEDED FOR ALLERGIC  RESPONSE AS DIRECTED BY MD. SEEK MEDICAL HELP AFTER  USE. 2 each 0   famotidine (PEPCID) 20 MG tablet TAKE 1 TABLET BY MOUTH DAILY  AFTER SUPPER 100 tablet 2   ferrous sulfate 325 (65 FE) MG tablet Take 325 mg by mouth daily with breakfast.     fexofenadine (ALLEGRA) 180 MG tablet Take 1 tablet (180 mg total) by mouth daily. 30 tablet 5   fexofenadine (ALLERGY RELIEF) 180 MG tablet TAKE 1 TABLET(180 MG) BY MOUTH DAILY 30 tablet 5   fluticasone (FLONASE) 50 MCG/ACT nasal spray USE 2 SPRAYS IN BOTH NOSTRILS  DAILY 48 g 1   fluticasone (FLOVENT HFA) 110 MCG/ACT inhaler Inhale 4 puffs twice a day with a spacer for the next 2 weeks or until cough and wheeze free 1 each 5   Fluticasone Furoate (ARNUITY ELLIPTA) 100 MCG/ACT AEPB Inhale 1 puff into the lungs daily as needed. 30 each 5    hydrochlorothiazide (HYDRODIURIL) 25 MG tablet Take 1 tablet (25 mg total) by mouth daily. 30 tablet 1   ipratropium (ATROVENT) 0.06 % nasal spray Place 2 sprays into both nostrils 4 (four) times daily as needed for rhinitis. 15 mL 5   Lancets (ONETOUCH DELICA PLUS LANCET33G) MISC SMARTSIG:1 Topical Daily     Mepolizumab (NUCALA) 100 MG/ML SOAJ Inject 1 mL (100 mg total) into the skin every 28 (twenty-eight) days. 1 mL 11   metFORMIN (GLUCOPHAGE-XR) 500 MG 24 hr tablet Take 500 mg by mouth in the morning and at bedtime.     mirtazapine (REMERON) 30 MG tablet TAKE 1 TABLET BY MOUTH AT  BEDTIME 30 tablet 5   montelukast (SINGULAIR) 10 MG tablet Take 1  tablet (10 mg total) by mouth at bedtime. 100 tablet 2   omeprazole (PRILOSEC) 20 MG capsule omeprazole 20 mg capsule,delayed release TK ONE C PO  BID     Oxcarbazepine (TRILEPTAL) 300 MG tablet Take 2 tablets (600 mg total) by mouth at bedtime. 60 tablet 5   potassium chloride SA (KLOR-CON M) 20 MEQ tablet Take 1 tablet (20 mEq total) by mouth daily. 30 tablet 1   pravastatin (PRAVACHOL) 20 MG tablet Take 1 tablet (20 mg total) by mouth at bedtime. 90 tablet 3   pregabalin (LYRICA) 100 MG capsule Take 100 mg by mouth 3 (three) times daily.     Respiratory Therapy Supplies (NEBULIZER) DEVI Use as directed with nebulizer solution. 1 each 1   Respiratory Therapy Supplies (NEBULIZER/TUBING/MOUTHPIECE) KIT Use as directed with nebulizer machine 1 kit 12   Semaglutide, 1 MG/DOSE, (OZEMPIC, 1 MG/DOSE,) 2 MG/1.5ML SOPN Take 1 mg by mouth once a week.     tiZANidine (ZANAFLEX) 2 MG tablet      triamcinolone (NASACORT) 55 MCG/ACT AERO nasal inhaler Place 2 sprays into the nose daily. 16.9 mL 5   zolpidem (AMBIEN) 10 MG tablet Take 0.5-1 tablets (5-10 mg total) by mouth at bedtime as needed. for sleep 30 tablet 5   No current facility-administered medications for this visit.    Medication Side Effects: None  Allergies:  Allergies  Allergen Reactions    Ramipril Other (See Comments)   Cymbalta [Duloxetine Hcl] Other (See Comments)    Caused hair loss   Methylprednisolone Acetate Hives and Rash    05/29/20 developed hives "all over my back" after MBB; resolved with Benadryl and "a few days."  Can tolerate Dexamethasone/Decadron. 05/29/20 developed hives "all over my back" after MBB; resolved with Benadryl and "a few days."  Can tolerate Dexamethasone/Decadron.   Rosuvastatin Calcium Other (See Comments) and Cough    Rapid heart rate   Almond Mea Itching and Swelling    Tongue swells and itches   Almond Oil Itching and Swelling    Itching and swelling of tongue    Carvedilol Cough   Fish Allergy Itching    Halibut fish   Losartan Potassium Cough   Morphine And Codeine Itching    Past Medical History:  Diagnosis Date   Allergic rhinoconjunctivitis    Anginal pain (HCC)    Anxiety    Arthritis    Spondylolysis, knee and ankle arthritis pains.   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  He uses a walker   Depression    Diabetes mellitus without complication (HCC)    dx in 2007   Diabetes mellitus, type II (HCC)    Dysrhythmia    Eczema    Headache(784.0)    sinus related    History of kidney stones    History of nephrolithiasis    Hx of pulmonary embolus 2017   Hyperlipidemia    Hypertension    Insomnia    Memory change    OSA on CPAP    PSG 05/24/2016: AHI 17/hour 02 mean 76%.  HSAT 06/13/17, AHI 11-hour 02 mean 79%   Paroxysmal atrial fibrillation (HCC) 02/24/2021   CHA2DS2-VASc score 4 (  HTN, DM, female, age-76); on Eliquis   Pneumonia    Tremor of both hands     Family History  Problem Relation Age of Onset   Diabetes Sister    Diabetes Brother     Hypertension Brother    Cancer Maternal Grandmother    Heart failure Maternal Grandmother    Hypertension Paternal Grandmother    Asthma Daughter    Cancer Daughter    Depression Daughter    Hypertension Daughter    Heart failure Maternal Aunt    Pneumonia Mother    Diabetes Mother    Alcoholism Father    Alcohol abuse Father    Depression Daughter    Schizophrenia Grandchild    Allergies Neg Hx    Eczema Neg Hx    Immunodeficiency Neg Hx     Social History   Socioeconomic History   Marital status: Single    Spouse name: Not on file   Number of children: 2   Years of education: Not on file   Highest education level: Some college, no degree  Occupational History    Comment: NA  Tobacco Use   Smoking status: Never    Passive exposure: Yes   Smokeless tobacco: Never   Tobacco comments:    grandson smokes, but in his car  Vaping Use   Vaping status: Never Used  Substance and Sexual Activity   Alcohol use: Yes    Comment: rare use   Drug use: No   Sexual activity: Not Currently  Other Topics Concern   Not on file  Social History Narrative   She is currently single.  Has 2 girls.  Has some college education.  Currently disabled due to chronic back pain and not employed.   07/01/19 lives with brother Onalee Hua   Caffeine, none   Social Determinants of Health   Financial Resource Strain: Not on file  Food Insecurity: Not on file  Transportation Needs: Not on file  Physical Activity: Not on file  Stress: Not on file  Social Connections: Not on file  Intimate Partner Violence: Not on file    Past Medical History, Surgical history, Social history, and Family history were reviewed and updated as appropriate.   Please see review of systems for further details on the patient's review from today.   Objective:   Physical Exam:  There were no vitals taken for this visit.  Physical Exam Neurological:     Mental Status: She is alert and oriented to person, place, and  time.     Cranial Nerves: No dysarthria.  Psychiatric:        Attention and Perception: Attention and perception normal.        Mood and Affect: Mood normal.        Speech: Speech normal.        Behavior: Behavior is cooperative.        Thought Content: Thought content normal. Thought content is not paranoid or delusional. Thought content does not include homicidal or suicidal ideation. Thought content does not include homicidal or suicidal plan.        Cognition and Memory: Cognition and memory normal.        Judgment: Judgment normal.     Comments: Insight intact     Lab Review:     Component Value Date/Time   NA 140 02/11/2023 1625   NA 141 10/07/2022 1219   K 3.9 02/11/2023 1625   CL 104 02/11/2023 1625   CO2 25 02/11/2023 1625  GLUCOSE 89 02/11/2023 1625   BUN 16 02/11/2023 1625   BUN 14 10/07/2022 1219   CREATININE 0.66 02/11/2023 1625   CALCIUM 9.7 02/11/2023 1625   PROT 6.7 02/11/2023 1625   PROT 7.0 10/07/2022 1219   ALBUMIN 3.8 02/11/2023 1625   ALBUMIN 4.7 10/07/2022 1219   AST 18 02/11/2023 1625   ALT 16 02/11/2023 1625   ALKPHOS 60 02/11/2023 1625   BILITOT 0.3 02/11/2023 1625   BILITOT <0.2 10/07/2022 1219   GFRNONAA >60 02/11/2023 1625   GFRAA 108 07/06/2020 0834       Component Value Date/Time   WBC 8.5 02/11/2023 1625   RBC 4.19 02/11/2023 1625   HGB 10.3 (L) 02/11/2023 1625   HGB 12.5 11/08/2018 1041   HCT 34.0 (L) 02/11/2023 1625   HCT 39.5 11/08/2018 1041   PLT 368 02/11/2023 1625   MCV 81.1 02/11/2023 1625   MCV 81 11/08/2018 1041   MCH 24.6 (L) 02/11/2023 1625   MCHC 30.3 02/11/2023 1625   RDW 18.6 (H) 02/11/2023 1625   RDW 14.3 11/08/2018 1041   LYMPHSABS 1.3 02/11/2023 1625   LYMPHSABS 1.4 11/08/2018 1041   MONOABS 0.9 02/11/2023 1625   EOSABS 0.0 02/11/2023 1625   EOSABS 0.2 11/08/2018 1041   BASOSABS 0.0 02/11/2023 1625   BASOSABS 0.0 11/08/2018 1041    No results found for: "POCLITH", "LITHIUM"   No results found for:  "PHENYTOIN", "PHENOBARB", "VALPROATE", "CBMZ"   .res Assessment: Plan:    32 minutes spent dedicated to the care of this patient on the date of this encounter to include pre-visit review of records, ordering of medication, post visit documentation, and face-to-face time with the patient discussing transfer of care to Yvette Rack, DNP since provider is leaving the practice. Pt reports that she would like to continue current medications without changes at this time.  Continue Remeron 30 mg at bedtime for depression and insomnia.  Continue Buspar 30 mg twice daily for anxiety.  Continue Abilify 4 mg daily for mood symptoms.  Continue Ambien as needed for insomnia.  Continue Klonopin 0.5 mg 1/2-1 tab po TID prn anxiety.  Continue Trileptal 600 mg at bedtime for mood stabilization and anxiety. Recommend continuing therapy with Rockne Menghini, LCSW.  Pt to follow-up in 3 months with [facility], DNP, or sooner if clinically indicated.  Patient advised to contact office with any questions, adverse effects, or acute worsening in signs and symptoms.    Areisy was seen today for anxiety.  Diagnoses and all orders for this visit:  Panic disorder -     clonazePAM (KLONOPIN) 0.5 MG tablet; Take 1/2-1 tab po TID prn anxiety. (Do not combine with Ambien)  Primary insomnia -     zolpidem (AMBIEN) 10 MG tablet; Take 0.5-1 tablets (5-10 mg total) by mouth at bedtime as needed. for sleep -     mirtazapine (REMERON) 30 MG tablet; TAKE 1 TABLET BY MOUTH AT  BEDTIME  Bipolar II disorder (HCC) -     ARIPiprazole (ABILIFY) 2 MG tablet; Take 2 tablets (4 mg total) by mouth daily. -     mirtazapine (REMERON) 30 MG tablet; TAKE 1 TABLET BY MOUTH AT  BEDTIME  Anxiety disorder, unspecified type -     busPIRone (BUSPAR) 30 MG tablet; Take 1 tablet (30 mg total) by mouth 2 (two) times daily. -     Oxcarbazepine (TRILEPTAL) 300 MG tablet; Take 2 tablets (600 mg total) by mouth at bedtime.     Please see  After Visit Summary for patient specific instructions.  Future Appointments  Date Time Provider Department Center  11/01/2023  3:20 PM Marykay Lex, MD [facility] None  11/10/2023 10:40 AM Marcelyn Bruins, MD [facility] None  11/14/2023  3:00 PM Mathis Fare, LCSW [facility] None  12/05/2023  3:00 PM Mathis Fare, LCSW [facility] None    No orders of the defined types were placed in this encounter.     -------------------------------

## 2023-10-31 ENCOUNTER — Other Ambulatory Visit: Payer: Self-pay

## 2023-10-31 MED ORDER — BEPOTASTINE BESILATE 1.5 % OP SOLN: 1.0000 [drp] | Freq: Every day | OPHTHALMIC | 0 refills | Status: DC | PRN

## 2023-11-01 ENCOUNTER — Ambulatory Visit: Payer: 59 | Attending: Cardiology | Admitting: Cardiology

## 2023-11-03 ENCOUNTER — Other Ambulatory Visit: Payer: Self-pay | Admitting: Cardiology

## 2023-11-10 ENCOUNTER — Telehealth: Payer: Self-pay | Admitting: Cardiology

## 2023-11-10 ENCOUNTER — Encounter: Payer: Self-pay | Admitting: Allergy

## 2023-11-10 ENCOUNTER — Ambulatory Visit (INDEPENDENT_AMBULATORY_CARE_PROVIDER_SITE_OTHER): Payer: 59 | Admitting: Allergy

## 2023-11-10 ENCOUNTER — Other Ambulatory Visit: Payer: Self-pay

## 2023-11-10 ENCOUNTER — Other Ambulatory Visit: Payer: Self-pay | Admitting: *Deleted

## 2023-11-10 VITALS — BP 120/76 | HR 96 | Temp 97.7°F | Resp 12 | Ht 64.25 in | Wt 173.2 lb

## 2023-11-10 DIAGNOSIS — H1013 Acute atopic conjunctivitis, bilateral: Secondary | ICD-10-CM | POA: Diagnosis not present

## 2023-11-10 DIAGNOSIS — L2089 Other atopic dermatitis: Secondary | ICD-10-CM | POA: Diagnosis not present

## 2023-11-10 DIAGNOSIS — J3089 Other allergic rhinitis: Secondary | ICD-10-CM

## 2023-11-10 DIAGNOSIS — J4541 Moderate persistent asthma with (acute) exacerbation: Secondary | ICD-10-CM

## 2023-11-10 DIAGNOSIS — K219 Gastro-esophageal reflux disease without esophagitis: Secondary | ICD-10-CM | POA: Diagnosis not present

## 2023-11-10 DIAGNOSIS — T7800XD Anaphylactic reaction due to unspecified food, subsequent encounter: Secondary | ICD-10-CM | POA: Diagnosis not present

## 2023-11-10 DIAGNOSIS — J0140 Acute pansinusitis, unspecified: Secondary | ICD-10-CM

## 2023-11-10 MED ORDER — AZELASTINE HCL 0.1 % NA SOLN: 2.0000 | Freq: Two times a day (BID) | NASAL | 1 refills | Status: DC

## 2023-11-10 MED ORDER — ALBUTEROL SULFATE (2.5 MG/3ML) 0.083% IN NEBU: INHALATION_SOLUTION | RESPIRATORY_TRACT | 1 refills | Status: DC

## 2023-11-10 MED ORDER — AMOXICILLIN-POT CLAVULANATE 875-125 MG PO TABS: 1.0000 | ORAL_TABLET | Freq: Two times a day (BID) | ORAL | 0 refills | Status: AC

## 2023-11-10 MED ORDER — PREDNISONE 20 MG PO TABS: 20.0000 mg | ORAL_TABLET | Freq: Two times a day (BID) | ORAL | 0 refills | Status: AC

## 2023-11-10 MED ORDER — FEXOFENADINE HCL 180 MG PO TABS: 180.0000 mg | ORAL_TABLET | Freq: Every day | ORAL | 1 refills | Status: DC

## 2023-11-10 MED ORDER — MONTELUKAST SODIUM 10 MG PO TABS: 10.0000 mg | ORAL_TABLET | Freq: Every day | ORAL | 1 refills | Status: DC

## 2023-11-10 MED ORDER — BUDESONIDE-FORMOTEROL FUMARATE 160-4.5 MCG/ACT IN AERO: INHALATION_SPRAY | RESPIRATORY_TRACT | 5 refills | Status: DC

## 2023-11-10 MED ORDER — IPRATROPIUM BROMIDE 0.06 % NA SOLN: 2.0000 | Freq: Four times a day (QID) | NASAL | 5 refills | Status: DC | PRN

## 2023-11-10 MED ORDER — ARNUITY ELLIPTA 100 MCG/ACT IN AEPB: 1.0000 | INHALATION_SPRAY | Freq: Every day | RESPIRATORY_TRACT | 5 refills | Status: AC | PRN

## 2023-11-10 MED ORDER — FLUTICASONE PROPIONATE 50 MCG/ACT NA SUSP: NASAL | 1 refills | Status: DC

## 2023-11-10 MED ORDER — ALBUTEROL SULFATE HFA 108 (90 BASE) MCG/ACT IN AERS: INHALATION_SPRAY | RESPIRATORY_TRACT | 2 refills | Status: DC

## 2023-11-10 MED ORDER — EPINASTINE HCL 0.05 % OP SOLN: 1.0000 [drp] | Freq: Two times a day (BID) | OPHTHALMIC | 5 refills | Status: DC | PRN

## 2023-11-10 NOTE — Telephone Encounter (Signed)
*  STAT* If patient is at the pharmacy, call can be transferred to refill team.   1. Which medications need to be refilled? (please list name of each medication and dose if known)   hydrochlorothiazide (HYDRODIURIL) 25 MG tablet  potassium chloride SA (KLOR-CON M) 20 MEQ tablet   2. Would you like to learn more about the convenience, safety, & potential cost savings by using the [facility]?   3. Are you open to using the [facility] (Type [facility]. ).  4. Which pharmacy/location (including street and city if local pharmacy) is medication to be sent to?  [facility] - [place] [address]   5. Do they need a 30 day or 90 day supply?   90 day  Caller Regis Bill) stated patient still has some medication.

## 2023-11-10 NOTE — Patient Instructions (Addendum)
Sinusitis with asthma exacerbation Symptoms ongoing for 1-2 weeks now not improving with sinus pressure/pain, increased congestion and mucus, headache, wheezing, cough, night sweats Will treat with Augmentin course 1 tab twice a day x 10 days Prednisone 20mg  1 tab twice a day x 5 days Get plenty of rest and stay well hydrated If needed can use Mucinex with plenty of water to help with cough or mucus thinning  Asthma Continue Symbicort 160- 2 puffs twice a day to prevent cough or wheeze. Continue Montelukast 10mg  once a day to prevent cough or wheeze. Continue Nucala injections once every 4 weeks. Asthma Action Plan (during respiratory illness or asthma flare): add in Arnuity1 puff once a day for 1-2 weeks and can stop once illness/symptoms have resolved.  Asthma control goals:  Full participation in all desired activities (may need albuterol before activity) Albuterol use two time or less a week on average (not counting use with activity) Cough interfering with sleep two time or less a month Oral steroids no more than once a year No hospitalizations  Allergic Rhinitis/Allergic Conjunctivitis Chronic Cough with Post Nasal Drainage -Continue allergen avoidance measures directed toward pollens, mold, pet, dust mite, cockroach, and mouse urine. - Use nasal saline rinses before nose sprays such as with Neilmed Sinus Rinse.  Use distilled water.   - Use Flonase 2 sprays each nostril daily or 1 spray twice daily. Aim upward and outward. - Use Azelastine 2 sprays each nostril twice daily. Aim upward and outward. - Use Ipratroprium 1-2 sprays up to four times daily as needed for runny nose. Aim upward and outward. - Use Allegra 180mg  daily.  - Use Singulair 10mg  daily.   - For eyes, use Elestat 1 eye drop daily as needed for itchy, watery eyes.  Also use lubricating eye drops as needed.   Food allergy Continue to avoid almonds.  In case of an allergic reaction, take Benadryl 50 mg every 4  hours, and if life-threatening symptoms occur, inject with EpiPen 0.3 mg.   Atopic dermatitis Continue a twice a day moisturizing routine with Aveeno. Continue triamcinolone 0.1% ointment up to red and itchy areas below your face up to twice a day as needed.   Reflux Continue dietary and lifestyle modifications. Continue Protonix 40 mg once a day as previously prescribed.   Follow-up in 6 months or sooner if needed

## 2023-11-10 NOTE — Progress Notes (Signed)
Follow-up Note  RE: Chelsea Benson MRN: [id number] DOB: [birth year] Date of Office Visit: 11/10/2023   History of present illness: Chelsea Benson is a 72 y.o. female presenting today for follow-up of asthma, allergic rhinoconjunctivitis, food allergy, atopic dermatitis and reflux.  Her last office visit on 05/25/23 by telemedicine.    Discussed the use of AI scribe software for clinical note transcription with the patient, who gave verbal consent to proceed.  She presents with symptoms suggestive of a sinus infection again. She reports a runny nose, runny eyes, and coughing, with mucus described as light yellow. These symptoms started approximately two weeks ago and have not improved. The patient denies any wheezing, chest tightness, or shortness of breath but has needed to use her rescue inhaler due to the cough. She also reports experiencing night sweats.  The patient has been using her regular medications, including Symbicort twice daily and Arnuity during flares, for her asthma. She has been using the Arnuity inhaler more frequently over the past week due to the current symptoms. She also reports using a nasal spray, flonase, but she ran out of it recently and believes this may have contributed to her current symptoms.  The patient also reports some issues with her eyes, which she describes as not matting of her eyes but matting around her eyelids. She has been using Bepreve eye drops, but she reports that these are no longer helpful.  The patient has not had any other illnesses or infections since her last visit in June, and she has not needed any antibiotics since then. The last time she had antibiotics was for a sinus infection.   Review of systems: 10pt ROS negative unless noted above in HPI   All other systems negative unless noted above in HPI  Past medical/social/surgical/family history have been reviewed and are unchanged unless specifically indicated below.  No  changes  Medication List: Current Outpatient Medications  Medication Sig Dispense Refill   ACCU-CHEK GUIDE test strip TEST BID PRN  1   amoxicillin-clavulanate (AUGMENTIN) 875-125 MG tablet Take 1 tablet by mouth 2 (two) times daily for 10 days. 20 tablet 0   apixaban (ELIQUIS) 5 MG TABS tablet Take 1 tablet (5 mg total) by mouth 2 (two) times daily. 200 tablet 0   ARIPiprazole (ABILIFY) 2 MG tablet Take 2 tablets (4 mg total) by mouth daily. 60 tablet 5   BISACODYL 5 MG EC tablet Take 5 mg by mouth once.     Blood Glucose Monitoring Suppl (ACCU-CHEK GUIDE) w/Device KIT See admin instructions.  1   busPIRone (BUSPAR) 30 MG tablet Take 1 tablet (30 mg total) by mouth 2 (two) times daily. 60 tablet 5   CALCIUM-VITAMIN D PO Take 1 tablet by mouth daily.     Carbinoxamine Maleate 4 MG TABS Take 1 tablet (4 mg total) by mouth 3 (three) times daily as needed. 180 tablet 1   clonazePAM (KLONOPIN) 0.5 MG tablet Take 1/2-1 tab po TID prn anxiety. (Do not combine with Ambien) 45 tablet 5   desonide (DESOWEN) 0.05 % ointment APPLY TO AFFECTED AREA(S)  TOPICALLY TWICE DAILY AS  NEEDED 180 g 1   diltiazem (CARDIZEM CD) 300 MG 24 hr capsule Take 1 capsule (300 mg total) by mouth daily. 30 capsule 1   diltiazem (CARDIZEM) 60 MG tablet TAKE 1 TABLET BY MOUTH EVERY 6 HOURS AS NEEDED FOR PALPITATION EDISODE. UP TO 2 TABLETS PER DAY 60 tablet 7   Epinastine HCl 0.05 %  ophthalmic solution Place 1 drop into both eyes 2 (two) times daily as needed (itchy/watery eyes). 510 mL 5   EPINEPHrine 0.3 mg/0.3 mL IJ SOAJ injection INJECT INTRAMUSCULARLY 1  PEN AS NEEDED FOR ALLERGIC  RESPONSE AS DIRECTED BY MD. SEEK MEDICAL HELP AFTER  USE. 2 each 0   famotidine (PEPCID) 20 MG tablet TAKE 1 TABLET BY MOUTH DAILY  AFTER SUPPER 100 tablet 2   ferrous sulfate 325 (65 FE) MG tablet Take 325 mg by mouth daily with breakfast.     fexofenadine (ALLERGY RELIEF) 180 MG tablet TAKE 1 TABLET(180 MG) BY MOUTH DAILY 30 tablet 5    GEMTESA 75 MG TABS Take 1 tablet by mouth daily.     hydrochlorothiazide (HYDRODIURIL) 25 MG tablet TAKE ONE TABLET (25 MG) BY MOUTH DAILY AT 9AM (MUST KEEP UPCOMING NOVEMBER APPOINTMENT FOR FURTHER REFILLS) 30 tablet 0   hydrocortisone 2.5 % cream Apply 1 Application topically 2 (two) times daily.     Lancets (ONETOUCH DELICA PLUS LANCET33G) MISC SMARTSIG:1 Topical Daily     lidocaine (LIDODERM) 5 % Place 1 patch onto the skin daily.     Mepolizumab (NUCALA) 100 MG/ML SOAJ Inject 1 mL (100 mg total) into the skin every 28 (twenty-eight) days. 1 mL 11   metFORMIN (GLUCOPHAGE-XR) 500 MG 24 hr tablet Take 500 mg by mouth in the morning and at bedtime.     methocarbamol (ROBAXIN) 500 MG tablet Take 500 mg by mouth as needed for muscle spasms.     mirtazapine (REMERON) 30 MG tablet TAKE 1 TABLET BY MOUTH AT  BEDTIME 30 tablet 5   omeprazole (PRILOSEC) 20 MG capsule omeprazole 20 mg capsule,delayed release TK ONE C PO  BID     Oxcarbazepine (TRILEPTAL) 300 MG tablet Take 2 tablets (600 mg total) by mouth at bedtime. 60 tablet 5   potassium chloride SA (KLOR-CON M) 20 MEQ tablet TAKE ONE TABLET (20 MEQ) BY MOUTH DAILY AT 9AM (MUST KEEP UPCOMING NOVEMBER APPOINTMENT FOR FURTHER REFILLS) 30 tablet 0   pravastatin (PRAVACHOL) 20 MG tablet Take 1 tablet (20 mg total) by mouth at bedtime. 90 tablet 3   predniSONE (DELTASONE) 20 MG tablet Take 1 tablet (20 mg total) by mouth 2 (two) times daily with a meal for 5 days. 10 tablet 0   pregabalin (LYRICA) 100 MG capsule Take 100 mg by mouth 3 (three) times daily.     Respiratory Therapy Supplies (NEBULIZER) DEVI Use as directed with nebulizer solution. 1 each 1   Respiratory Therapy Supplies (NEBULIZER/TUBING/MOUTHPIECE) KIT Use as directed with nebulizer machine 1 kit 12   Semaglutide, 1 MG/DOSE, (OZEMPIC, 1 MG/DOSE,) 2 MG/1.5ML SOPN Take 1 mg by mouth once a week.     tiZANidine (ZANAFLEX) 2 MG tablet      traMADol (ULTRAM) 50 MG tablet Take 50 mg by mouth  every 8 (eight) hours as needed.     triamcinolone (NASACORT) 55 MCG/ACT AERO nasal inhaler Place 2 sprays into the nose daily. 16.9 mL 5   zolpidem (AMBIEN) 10 MG tablet Take 0.5-1 tablets (5-10 mg total) by mouth at bedtime as needed. for sleep 30 tablet 5   albuterol (PROVENTIL) (2.5 MG/3ML) 0.083% nebulizer solution USE 1 VIAL IN NEBULIZER EVERY 4 HOURS - and as needed 225 mL 1   albuterol (VENTOLIN HFA) 108 (90 Base) MCG/ACT inhaler USE 2 INHALATIONS BY MOUTH EVERY 4 HOURS AS NEEDED FOR WHEEZING  OR SHORTNESS OF BREATH 40.2 g 2   azelastine (ASTELIN) 0.1 %  nasal spray Place 2 sprays into both nostrils 2 (two) times daily. 90 mL 1   budesonide-formoterol (SYMBICORT) 160-4.5 MCG/ACT inhaler INHALE 2 INHALATIONS BY MOUTH  INTO THE LUNGS IN THE MORNING  AND AT BEDTIME 10.2 g 5   fexofenadine (ALLEGRA) 180 MG tablet Take 1 tablet (180 mg total) by mouth daily. 90 tablet 1   fluticasone (FLONASE) 50 MCG/ACT nasal spray USE 2 SPRAYS IN BOTH NOSTRILS  DAILY 48 g 1   Fluticasone Furoate (ARNUITY ELLIPTA) 100 MCG/ACT AEPB Inhale 1 puff into the lungs daily as needed. 30 each 5   ipratropium (ATROVENT) 0.06 % nasal spray Place 2 sprays into both nostrils 4 (four) times daily as needed for rhinitis. 15 mL 5   montelukast (SINGULAIR) 10 MG tablet Take 1 tablet (10 mg total) by mouth at bedtime. 90 tablet 1   No current facility-administered medications for this visit.     Known medication allergies: Allergies  Allergen Reactions   Ramipril Other (See Comments)   Cymbalta [Duloxetine Hcl] Other (See Comments)    Caused hair loss   Methylprednisolone Acetate Hives and Rash    05/29/20 developed hives "all over my back" after MBB; resolved with Benadryl and "a few days."  Can tolerate Dexamethasone/Decadron. 05/29/20 developed hives "all over my back" after MBB; resolved with Benadryl and "a few days."  Can tolerate Dexamethasone/Decadron.   Rosuvastatin Calcium Other (See Comments) and Cough    Rapid  heart rate   Almond Mea Itching and Swelling    Tongue swells and itches   Almond Oil Itching and Swelling    Itching and swelling of tongue    Carvedilol Cough   Fish Allergy Itching    Halibut fish   Losartan Potassium Cough   Morphine And Codeine Itching    Physical examination: Blood pressure 120/76, pulse 96, temperature 97.7 F (36.5 C), temperature source Temporal, resp. rate 12, height 5' 4.25" (1.632 m), weight 173 lb 3.2 oz (78.6 kg), SpO2 96%.  General: Alert, interactive, in no acute distress. HEENT: PERRLA, slightly goopiness of corners of eyes, TMs pearly gray, turbinates moderately edematous with clear discharge, post-pharynx non erythematous. Neck: Supple without lymphadenopathy. Lungs: Clear to auscultation without wheezing, rhonchi or rales. {no increased work of breathing. CV: Normal S1, S2 without murmurs. Abdomen: Nondistended, nontender. Skin: Warm and dry, without lesions or rashes. Extremities:  No clubbing, cyanosis or edema. Neuro:   Grossly intact.  Diagnositics/Labs:  Spirometry: FEV1: 1.39L 74%, FVC: 1.94L 81%, ratio consistent with nonobstructve pattern for age/demographic  Assessment and plan:   Sinusitis with asthma exacerbation Symptoms ongoing for 1-2 weeks now not improving with sinus pressure/pain, increased congestion and mucus, headache, wheezing, cough, night sweats Will treat with Augmentin course 1 tab twice a day x 10 days Prednisone 20mg  1 tab twice a day x 5 days Get plenty of rest and stay well hydrated If needed can use Mucinex with plenty of water to help with cough or mucus thinning  Asthma Continue Symbicort 160- 2 puffs twice a day to prevent cough or wheeze. Continue Montelukast 10mg  once a day to prevent cough or wheeze. Continue Nucala injections once every 4 weeks. Asthma Action Plan (during respiratory illness or asthma flare): add in Arnuity1 puff once a day for 1-2 weeks and can stop once illness/symptoms have  resolved.  Asthma control goals:  Full participation in all desired activities (may need albuterol before activity) Albuterol use two time or less a week on average (not counting use with  activity) Cough interfering with sleep two time or less a month Oral steroids no more than once a year No hospitalizations  Allergic Rhinitis/Allergic Conjunctivitis Chronic Cough with Post Nasal Drainage -Continue allergen avoidance measures directed toward pollens, mold, pet, dust mite, cockroach, and mouse urine. - Use nasal saline rinses before nose sprays such as with Neilmed Sinus Rinse.  Use distilled water.   - Use Flonase 2 sprays each nostril daily or 1 spray twice daily. Aim upward and outward. - Use Azelastine 2 sprays each nostril twice daily. Aim upward and outward. - Use Ipratroprium 1-2 sprays up to four times daily as needed for runny nose. Aim upward and outward. - Use Allegra 180mg  daily.  - Use Singulair 10mg  daily.   - For eyes, use Elestat 1 eye drop daily as needed for itchy, watery eyes.  Also use lubricating eye drops as needed.   Food allergy Continue to avoid almonds.  In case of an allergic reaction, take Benadryl 50 mg every 4 hours, and if life-threatening symptoms occur, inject with EpiPen 0.3 mg.   Atopic dermatitis Continue a twice a day moisturizing routine with Aveeno. Continue triamcinolone 0.1% ointment up to red and itchy areas below your face up to twice a day as needed.   Reflux Continue dietary and lifestyle modifications. Continue Protonix 40 mg once a day as previously prescribed.   Follow-up in 6 months or sooner if needed  I appreciate the opportunity to take part in Chelsea Benson's care. Please do not hesitate to contact me with questions.  Sincerely,   Margo Aye, MD Allergy/Immunology [facility]

## 2023-11-14 ENCOUNTER — Ambulatory Visit: Payer: 59 | Admitting: Psychiatry

## 2023-11-14 ENCOUNTER — Other Ambulatory Visit: Payer: Self-pay

## 2023-11-14 ENCOUNTER — Telehealth: Payer: Self-pay | Admitting: Psychiatry

## 2023-11-14 DIAGNOSIS — F5101 Primary insomnia: Secondary | ICD-10-CM

## 2023-11-14 DIAGNOSIS — F419 Anxiety disorder, unspecified: Secondary | ICD-10-CM

## 2023-11-14 NOTE — Telephone Encounter (Signed)
Called [facility]. Was told that the Trileptal was shipped with her other medications in the same box and was delivered on 11/29. Pharmacy did not have [facility] zolpidem. Had difficulty finding it locally, but did find a pharmacy that had it and Rx sent. Rx sent to [facility] has note of preferred manufacturer but they said they can't get it. LVM to [facility].

## 2023-11-14 NOTE — Progress Notes (Signed)
[facility] Counselor/Therapist Progress Note  Patient ID: Chelsea Benson, MRN: [id number],    Date: 11/14/2023  Time Spent: 50 minutes   Treatment Type: Individual Therapy  Virtual Visit via Telehealth Note: MyChart Video session Connected with patient by a telemedicine/telehealth application, with their informed consent, and verified patient privacy and that I am speaking with the correct person using two identifiers. I discussed the limitations, risks, security and privacy concerns of performing psychotherapy and the availability of in person appointments. I also discussed with the patient that there may be a patient responsible charge related to this service. The patient expressed understanding and agreed to proceed. I discussed the treatment planning with the patient. The patient was provided an opportunity to ask questions and all were answered. The patient agreed with the plan and demonstrated an understanding of the instructions. The patient was advised to call  our office if  symptoms worsen or feel they are in a crisis state and need immediate contact.   Therapist Location: office Patient Location: home   Reported Symptoms: anxiety, family problem due to family member's alcoholism and is with patient currently   Mental Status Exam:  Appearance:   Casual     Behavior:  Appropriate, Sharing, and Motivated  Motor:  Uses rollater  Speech/Language:   Clear and Coherent  Affect:  anxious  Mood:  anxious  Thought process:  goal directed  Thought content:    Rumination  Sensory/Perceptual disturbances:    WNL  Orientation:  oriented to person, place, time/date, situation, day of week, month of year, year, and stated date of Dec. 10, 2024  Attention:  Good  Concentration:  Good  Memory:  WNL  Fund of knowledge:   Good  Insight:    Good and Fair  Judgment:   Good  Impulse Control:  Good and Fair   Risk Assessment: Danger to Self:  No Self-injurious Behavior:  No Danger to Others: No Duty to Warn:no Physical Aggression / Violence:No  Access to Firearms a concern: No  Gang Involvement:No   Subjective:  Patient participating in session today and reporting issues within her family and extended family, challenges of alcoholic brother living with her and reports he "has increased his drinking some."  Has been sick recently with "allergies and a cold" but "getting better now." Shared and processed today some issues with a friend living in [place]. (Not all details included in this note due to patient privacy needs.) Upset with friend and not understanding his decisions/boundaries, but did talk through this more at length today and seemed to benefit from this and helped her at least to vent, feel heard, and eventually feel more settled and focused. Encouraged to maintain healthy contact with others, stay on her prescribed meds, get enough sleep, maintain healthy boundaries within family, and stay in contact with supportive friends.   Interventions: Cognitive Behavioral Therapy, Solution-Oriented/Positive Psychology, and Ego-Supportive  Long term goal: (measurable) Reduce overall level, frequency, and intensity of the anxiety so that daily functioning is not impaired.  Patient will eventually report a rating of "4" or less" on 1-10 anxiety scale for a 31-month time period where her daily functioning is not impaired.  Short term goal: Increase understanding of beliefs and messages that produce the worry and anxiety. Strategy: Patient will explore and work to stop cognitive messages that feed anxiety and work to replace them with more positive and empowering messages   Diagnosis:   ICD-10-CM   1. Anxiety disorder, unspecified  type  F41.9      Plan:  Patient today showing good effort and participation in session as she focused more on her issues and concerns relating to her anxiety but also noticing some improvement "in certain situations".  Anxiety is also  aggravated at times by her alcoholic brother and other issues within the extended family.  She has shown progress and motivation, and needs to continue working with her goal-directed behaviors to keep moving in a more positive and healthier direction.  Goal review and progress/challenges noted with patient.  Next appointment within 3 to 4 weeks.   Mathis Fare, LCSW

## 2023-11-14 NOTE — Telephone Encounter (Signed)
Called patient and she said [facility] sent the wrong manufacturer for Zolpidem. She also reports not getting her Trileptal.

## 2023-11-14 NOTE — Telephone Encounter (Signed)
Pt called and left ambiguous message about sending RXS to [facility]. I called her back and asked her to clarify which meds she needs.

## 2023-11-14 NOTE — Telephone Encounter (Signed)
Pended Rx for #6 tablets of Zolpidem to [facility] on [address]. Confirmed they have [facility] in stock. She last filled 11/18 with [facility]. This will cover her until she is due for a full RF.

## 2023-11-15 MED ORDER — ZOLPIDEM TARTRATE 10 MG PO TABS: 5.0000 mg | ORAL_TABLET | Freq: Every evening | ORAL | 0 refills | Status: DC | PRN

## 2023-11-16 DIAGNOSIS — G894 Chronic pain syndrome: Secondary | ICD-10-CM | POA: Diagnosis not present

## 2023-11-16 DIAGNOSIS — M47816 Spondylosis without myelopathy or radiculopathy, lumbar region: Secondary | ICD-10-CM | POA: Diagnosis not present

## 2023-11-16 DIAGNOSIS — M62838 Other muscle spasm: Secondary | ICD-10-CM | POA: Diagnosis not present

## 2023-11-17 ENCOUNTER — Telehealth: Payer: Self-pay | Admitting: Allergy

## 2023-11-29 ENCOUNTER — Other Ambulatory Visit: Payer: Self-pay | Admitting: Cardiology

## 2023-12-04 ENCOUNTER — Ambulatory Visit (INDEPENDENT_AMBULATORY_CARE_PROVIDER_SITE_OTHER): Payer: 59

## 2023-12-04 ENCOUNTER — Telehealth: Payer: Self-pay | Admitting: Allergy

## 2023-12-04 DIAGNOSIS — Z538 Procedure and treatment not carried out for other reasons: Secondary | ICD-10-CM

## 2023-12-04 MED ORDER — BREATHE COMFORT CHAMBER/ADULT DEVI: 1.0000 | Freq: Every day | 0 refills | Status: DC | PRN

## 2023-12-04 NOTE — Telephone Encounter (Signed)
Put an order in for a spacer, called and informed Ronne that it was put into the pharmacy.  Tomma Lightning [phone]

## 2023-12-04 NOTE — Telephone Encounter (Signed)
Patient called and stated that she needs a prescription called in for a spacer for her inhaler. Patients pharmacy is [facility] on [address]. Patient is requesting a call back to let her know when prescription is called in. Patients call back number is [phone]

## 2023-12-05 ENCOUNTER — Ambulatory Visit: Payer: 59 | Admitting: Psychiatry

## 2023-12-05 DIAGNOSIS — F3181 Bipolar II disorder: Secondary | ICD-10-CM

## 2023-12-05 NOTE — Progress Notes (Signed)
 [facility] Counselor/Therapist Progress Note  Patient ID: Chelsea Benson, MRN: [id number],    Date: 12/05/2023  Time Spent: 48 minutes   Treatment Type: Individual Therapy  Virtual Visit via Telehealth Note: MyChart Video Note Connected with patient by a telemedicine/telehealth application, with their informed consent, and verified patient privacy and that I am speaking with the correct person using two identifiers. I discussed the limitations, risks, security and privacy concerns of performing psychotherapy and the availability of in person appointments. I also discussed with the patient that there may be a patient responsible charge related to this service. The patient expressed understanding and agreed to proceed. I discussed the treatment planning with the patient. The patient was provided an opportunity to ask questions and all were answered. The patient agreed with the plan and demonstrated an understanding of the instructions. The patient was advised to call  our office if  symptoms worsen or feel they are in a crisis state and need immediate contact.   Therapist Location: office Patient Location: home   Reported Symptoms:  anxiety, family member's alcoholism and he is living with patient, worrying   Mental Status Exam:  Appearance:   Casual     Behavior:  Appropriate, Sharing, and Motivated  Motor:  Using rollator as she moves about  Speech/Language:   Clear and Coherent  Affect:  anxious  Mood:  anxious  Thought process:  goal directed  Thought content:    Rumination  Sensory/Perceptual disturbances:    WNL  Orientation:  oriented to person, place, time/date, situation, day of week, month of year, year, and stated date of Dec. 31, 2024  Attention:  Good  Concentration:  Good  Memory:  WNL  Fund of knowledge:   Good  Insight:    Good  Judgment:   Good  Impulse Control:  Good   Risk Assessment: Danger to Self:  No Self-injurious Behavior: No Danger to  Others: No Duty to Warn:no Physical Aggression / Violence:No  Access to Firearms a concern: No  Gang Involvement:No   Subjective: Patient showing good motivation and participation in session today as she continues to work on personal and family/extended family issues, particularly some issues with setting limits with her alcoholic brother that lives with her, and being able to set healthy boundaries as needed. Reports the holidays were nice, went to daughter's house for Christmas Day for dinner and gift exchange, and all went well. Currently sickly with coughing and sneezing and wonders if she might have pneumonia, but insisted she felt up for having our appt and is participating quite well. Does feel brother is decreasing his drinking at this point. Is wanting to live with friend in [place]  but that has not worked out so far. Is going to family funeral in early January and stay a few nights and then return home. Has had some bipolar issues recently, moved around some furniture, and that helped. States she is remaining on her meds. Talked further about her anxiety and bipolar issues which she states she managed flare-ups better than some times in the past. Talks frequently with a 71 yr old female minister friend and that is a help to patient also. Reports sleeping well.  Encouraged patient to follow through with her doctors appointment later this week and also to keep up her more frequent contact with others that are supportive of her as that definitely helps impact her mood and positive ways.  Continue to encourage patient to remain on her prescribed  meds, allow for good sleep patterns, continue her work on establishing healthier boundaries within the family, staying in contact with supportive friends, exercise care and caution as she moves about especially with her arthritis issues, and practice some improved strategies as noted in session regarding ways of better managing anxiety and worry when  they occur.  Interventions: Cognitive Behavioral Therapy and Ego-Supportive  Long term goal: (measurable) Reduce overall level, frequency, and intensity of the anxiety so that daily functioning is not impaired.  Patient will eventually report a rating of 4 or less on 1-10 anxiety scale for a 86-month time period where her daily functioning is not impaired.  Short term goal: Increase understanding of beliefs and messages that produce the worry and anxiety. Strategy: Patient will explore and work to stop cognitive messages that feed anxiety and work to replace them with more positive and empowering messages  Diagnosis:   ICD-10-CM   1. Bipolar II disorder (HCC)  F31.81      Plan:  Patient today continuing to have good participation in sessions and today followed up on her anxiety and worrying issues that relate mostly to personal and family relationships, interactions, and the need for healthier boundaries.  Patient has definitely made some progress and needs to continue working with goal-directed behaviors in order to keep moving in a more hopeful and healthier direction.  Goal review and progress/challenges noted with patient.  Next appointment within 3 to 4 weeks.   Barnie Bunde, LCSW

## 2023-12-07 DIAGNOSIS — I10 Essential (primary) hypertension: Secondary | ICD-10-CM | POA: Diagnosis not present

## 2023-12-07 DIAGNOSIS — J4550 Severe persistent asthma, uncomplicated: Secondary | ICD-10-CM | POA: Diagnosis not present

## 2023-12-07 DIAGNOSIS — I480 Paroxysmal atrial fibrillation: Secondary | ICD-10-CM | POA: Diagnosis not present

## 2023-12-07 DIAGNOSIS — E785 Hyperlipidemia, unspecified: Secondary | ICD-10-CM | POA: Diagnosis not present

## 2023-12-07 DIAGNOSIS — J069 Acute upper respiratory infection, unspecified: Secondary | ICD-10-CM | POA: Diagnosis not present

## 2023-12-17 DIAGNOSIS — M47816 Spondylosis without myelopathy or radiculopathy, lumbar region: Secondary | ICD-10-CM | POA: Diagnosis not present

## 2023-12-17 DIAGNOSIS — M62838 Other muscle spasm: Secondary | ICD-10-CM | POA: Diagnosis not present

## 2023-12-17 DIAGNOSIS — G894 Chronic pain syndrome: Secondary | ICD-10-CM | POA: Diagnosis not present

## 2023-12-22 ENCOUNTER — Other Ambulatory Visit: Payer: Self-pay | Admitting: Cardiology

## 2023-12-27 ENCOUNTER — Ambulatory Visit: Payer: 59 | Admitting: Psychiatry

## 2023-12-28 ENCOUNTER — Other Ambulatory Visit: Payer: Self-pay | Admitting: Cardiology

## 2023-12-28 DIAGNOSIS — I480 Paroxysmal atrial fibrillation: Secondary | ICD-10-CM

## 2023-12-29 NOTE — Telephone Encounter (Signed)
Prescription refill request for Eliquis received. Indication: Afib  Last office visit: 07/26/23 Louanne Skye)  Scr: 0.66 (02/11/23)  Age: 73 Weight: 78.6kg  Appropriate dose. Refill sent.

## 2024-01-05 ENCOUNTER — Telehealth: Payer: Self-pay | Admitting: Cardiology

## 2024-01-05 NOTE — Telephone Encounter (Signed)
*  STAT* If patient is at the pharmacy, call can be transferred to refill team.   1. Which medications need to be refilled? (please list name of each medication and dose if known)   diltiazem (CARDIZEM) 60 MG tablet    4. Which pharmacy/location (including street and city if local pharmacy) is medication to be sent to?[id number] - [place], [place] - [address] AT [address]    5. Do they need a 30 day or 90 day supply? 90

## 2024-01-08 MED ORDER — DILTIAZEM HCL 60 MG PO TABS: ORAL_TABLET | ORAL | 1 refills | Status: DC

## 2024-01-12 ENCOUNTER — Telehealth: Payer: Self-pay | Admitting: Allergy

## 2024-01-12 DIAGNOSIS — F3181 Bipolar II disorder: Secondary | ICD-10-CM

## 2024-01-12 MED ORDER — FLUTICASONE PROPIONATE 50 MCG/ACT NA SUSP: NASAL | 1 refills | Status: DC

## 2024-01-12 NOTE — Telephone Encounter (Signed)
 I did send in a refill of Flonase  as Dr Tempie Fee had prescribed that to her in dec 2024. The Abilify  was prescribed by another provider

## 2024-01-12 NOTE — Telephone Encounter (Signed)
 Sunny from [facility] called and requested a refill on medication  ARIPiprazole  ARIPiprazole  (ABILIFY ) 2 MG tablet and  fluticasone  fluticasone  (FLONASE ) 50 MCG/ACT nasal spray and requested a call back at [phone].

## 2024-01-16 ENCOUNTER — Ambulatory Visit (INDEPENDENT_AMBULATORY_CARE_PROVIDER_SITE_OTHER): Payer: 59 | Admitting: Psychiatry

## 2024-01-16 DIAGNOSIS — F3181 Bipolar II disorder: Secondary | ICD-10-CM | POA: Diagnosis not present

## 2024-01-16 NOTE — Progress Notes (Signed)
[facility] Counselor/Therapist Progress Note  Patient ID: Chelsea Benson, MRN: [id number],    Date: 01/16/2024  Time Spent: 48 minutes  Treatment Type: Individual Therapy  Virtual Visit via Telehealth Note: MyChart Video session: Connected with patient by a telemedicine/telehealth application, with their informed consent, and verified patient privacy and that I am speaking with the correct person using two identifiers. I discussed the limitations, risks, security and privacy concerns of performing psychotherapy and the availability of in person appointments. I also discussed with the patient that there may be a patient responsible charge related to this service. The patient expressed understanding and agreed to proceed. I discussed the treatment planning with the patient. The patient was provided an opportunity to ask questions and all were answered. The patient agreed with the plan and demonstrated an understanding of the instructions. The patient was advised to call  our office if  symptoms worsen or feel they are in a crisis state and need immediate contact.   Therapist Location: office Patient Location: home   Reported Symptoms: increased anxiety, "had family emergency with adult grandson in hospital but got discharged and is ok now but needing followup care with his physician" ("heart and breathing issues, retaining fluids"), worrying although adult grandson is discharged from hospital and stabilized".     Mental Status Exam:  Appearance:   Casual     Behavior:  Appropriate and Sharing  Motor:  Uses rollator in walking  Speech/Language:   Clear and Coherent  Affect:  anxious  Mood:  anxious  Thought process:  goal directed  Thought content:    Rumination  Sensory/Perceptual disturbances:    WNL  Orientation:  oriented to person, place, time/date, situation, day of week, month of year, year, and stated date of Feb. 11, 2025  Attention:  Good  Concentration:  Good   Memory:  WNL  Fund of knowledge:   Good  Insight:    Good  Judgment:   Good  Impulse Control:  Good   Risk Assessment: Danger to Self:  No Self-injurious Behavior: No Danger to Others: No Duty to Warn:no Physical Aggression / Violence:No  Access to Firearms a concern: No  Gang Involvement:No   Subjective:   Patient today reporting increased anxiety due to family illness issues and family conflicts. (Not all details included in this note due to patient privacy needs.) Did have a fall in her yard last week but reports she was not hurt. Concerned as noted above about adult grandson's emergency admission to hospital but is discharged now and will be living with his mom. Motivated and good participation in session today. Reports continues work on family/personal issues and needing to set healthier boundaries. Is still communicating with friend in [place] and considering living up there but friend is not encouraging that move. Remains on her meds and "doing better physically". Sleeping well. In touch with female minister friend which is supportive. Maintaining contact with family and friends, and trying to have better boundaries with alcoholic brother. Still checks in behind him when he is cooking to make sure all kitchen appliances are cut off when he is finished, due to prior concerns.  Overall, some recent volatility in her mood, including anxiety and depression but she states it has been more anxiety however feels more leveled out now as family situations have calmed some more recently.  Encouraged her to be moving about as she is able within her home, remain on her prescribed medications, allow for good sleep  patterns, use healthier boundaries within the family, remain in contact with supportive friends, and continue her work on better managing anxiety and worry (as discussed in session).   Interventions: Cognitive Behavioral Therapy and Ego-Supportive  Long term goal: (measurable) Reduce overall  level, frequency, and intensity of the anxiety so that daily functioning is not impaired.  Patient will eventually report a rating of "4" or less" on 1-10 anxiety scale for a 3-month time period where her daily functioning is not impaired.  Short term goal: Increase understanding of beliefs and messages that produce the worry and anxiety. Strategy: Patient will explore and work to stop cognitive messages that feed anxiety and work to replace them with more positive and empowering messages.  Diagnosis:   ICD-10-CM   1. Bipolar II disorder (HCC)  F31.81      Plan:  Patient motivated and focusing today further on her anxiety and tendency to "over worry" however she reports these past couple of weeks she has been better after having gotten through and processed more today a couple of family crises.  Reports it is difficult for her not to worry about family situations but that it does help her to talk through situations when stressors do occur and that helps her and better manage her symptoms.  Active participation in session today particularly regarding her worry and various family circumstances.  (Not all details included in this note due to patient privacy needs).  Overall patient has made some progress and needs to continue her work with goal-directed behaviors aiming to move in a hopeful and healthier direction.  Goal review and progress/challenges noted with patient.  Next appointment within 3 to 4 weeks.   Mathis Fare, LCSW

## 2024-01-17 DIAGNOSIS — M5416 Radiculopathy, lumbar region: Secondary | ICD-10-CM | POA: Diagnosis not present

## 2024-01-17 DIAGNOSIS — M47816 Spondylosis without myelopathy or radiculopathy, lumbar region: Secondary | ICD-10-CM | POA: Diagnosis not present

## 2024-01-17 DIAGNOSIS — M62838 Other muscle spasm: Secondary | ICD-10-CM | POA: Diagnosis not present

## 2024-01-17 DIAGNOSIS — G894 Chronic pain syndrome: Secondary | ICD-10-CM | POA: Diagnosis not present

## 2024-01-18 DIAGNOSIS — M47816 Spondylosis without myelopathy or radiculopathy, lumbar region: Secondary | ICD-10-CM | POA: Diagnosis not present

## 2024-01-18 DIAGNOSIS — G894 Chronic pain syndrome: Secondary | ICD-10-CM | POA: Diagnosis not present

## 2024-01-18 DIAGNOSIS — M62838 Other muscle spasm: Secondary | ICD-10-CM | POA: Diagnosis not present

## 2024-01-19 ENCOUNTER — Telehealth: Payer: Self-pay | Admitting: Psychiatry

## 2024-01-19 NOTE — Telephone Encounter (Signed)
Chart shows RF available, patient notified.

## 2024-01-19 NOTE — Telephone Encounter (Signed)
Patient called with refill request for Clonazepam 0.5mg . She will continue with TH. Doesn't have an appt scheduled right now. Ph: [phone] Pharmacy [facility] [address] [place]

## 2024-01-22 ENCOUNTER — Other Ambulatory Visit: Payer: Self-pay | Admitting: Cardiology

## 2024-01-23 DIAGNOSIS — M25511 Pain in right shoulder: Secondary | ICD-10-CM | POA: Diagnosis not present

## 2024-01-25 ENCOUNTER — Ambulatory Visit: Payer: 59 | Admitting: Physician Assistant

## 2024-01-29 ENCOUNTER — Other Ambulatory Visit: Payer: Self-pay | Admitting: Cardiology

## 2024-01-31 DIAGNOSIS — M25511 Pain in right shoulder: Secondary | ICD-10-CM | POA: Diagnosis not present

## 2024-02-01 DIAGNOSIS — E232 Diabetes insipidus: Secondary | ICD-10-CM | POA: Diagnosis not present

## 2024-02-01 DIAGNOSIS — H2513 Age-related nuclear cataract, bilateral: Secondary | ICD-10-CM | POA: Diagnosis not present

## 2024-02-01 DIAGNOSIS — H11823 Conjunctivochalasis, bilateral: Secondary | ICD-10-CM | POA: Diagnosis not present

## 2024-02-01 DIAGNOSIS — H524 Presbyopia: Secondary | ICD-10-CM | POA: Diagnosis not present

## 2024-02-06 ENCOUNTER — Ambulatory Visit: Payer: 59 | Admitting: Psychiatry

## 2024-02-07 DIAGNOSIS — M25511 Pain in right shoulder: Secondary | ICD-10-CM | POA: Diagnosis not present

## 2024-02-13 ENCOUNTER — Other Ambulatory Visit: Payer: Self-pay | Admitting: Allergy

## 2024-02-15 DIAGNOSIS — M25511 Pain in right shoulder: Secondary | ICD-10-CM | POA: Diagnosis not present

## 2024-02-16 DIAGNOSIS — M19211 Secondary osteoarthritis, right shoulder: Secondary | ICD-10-CM | POA: Diagnosis not present

## 2024-02-16 DIAGNOSIS — M47816 Spondylosis without myelopathy or radiculopathy, lumbar region: Secondary | ICD-10-CM | POA: Diagnosis not present

## 2024-02-16 DIAGNOSIS — G894 Chronic pain syndrome: Secondary | ICD-10-CM | POA: Diagnosis not present

## 2024-02-16 DIAGNOSIS — M62838 Other muscle spasm: Secondary | ICD-10-CM | POA: Diagnosis not present

## 2024-02-17 DIAGNOSIS — M47816 Spondylosis without myelopathy or radiculopathy, lumbar region: Secondary | ICD-10-CM | POA: Diagnosis not present

## 2024-02-17 DIAGNOSIS — M62838 Other muscle spasm: Secondary | ICD-10-CM | POA: Diagnosis not present

## 2024-02-17 DIAGNOSIS — G894 Chronic pain syndrome: Secondary | ICD-10-CM | POA: Diagnosis not present

## 2024-02-19 ENCOUNTER — Telehealth: Payer: Self-pay | Admitting: *Deleted

## 2024-02-19 NOTE — Telephone Encounter (Signed)
   Pre-operative Risk Assessment    Patient Name: Chelsea Benson  DOB: 04/10/1951 MRN: [id number]   Date of last office visit: 07/26/23 TELE VISIT; 07/20/22 SCOTT WEAVER, PAC IN OFFICE  Date of next office visit: 03/05/24 DR. HARDING   Request for Surgical Clearance    Procedure:   RIGHT REVERSE TOTAL SHOULDER ARTHROPLASTY  Date of Surgery:  Clearance TBD                                Surgeon:  DR. Jones Broom Surgeon's Group or Practice Name:  [facility] Phone number:  [phone] Fax number:  [phone]   Type of Clearance Requested:   - Medical  - Pharmacy:  Hold Apixaban (Eliquis)     Type of Anesthesia:   CHOICE   Additional requests/questions:    Elpidio Anis   02/19/2024, 5:45 PM

## 2024-02-20 ENCOUNTER — Other Ambulatory Visit: Payer: Self-pay | Admitting: Cardiology

## 2024-02-20 NOTE — Telephone Encounter (Signed)
 Preoperative team patient has upcoming appointment with Dr. Herbie Baltimore on 03/05/2024.  Please add preoperative cardiac evaluation to appointment notes.  I will defer cardiac evaluation to appointment.  Thank you for your help.  Thomasene Ripple. Creighton Longley NP-C     02/20/2024, 2:29 PM [facility] [address] Office [phone] Fax [phone]

## 2024-02-20 NOTE — Telephone Encounter (Signed)
 Patient with diagnosis of atrial fibrilation on Eliquis for anticoagulation.    Procedure:   RIGHT REVERSE TOTAL SHOULDER ARTHROPLASTY   Date of Surgery:  Clearance TBD     CHA2DS2-VASc Score = 4   This indicates a 4.8% annual risk of stroke. The patient's score is based upon: CHF History: 0 HTN History: 1 Diabetes History: 1 Stroke History: 0 Vascular Disease History: 0 Age Score: 1 Gender Score: 1   CrCl 91 Platelet count 368  Per office protocol, patient can hold Eliquis for 3 days prior to procedure.   Patient will not need bridging with Lovenox (enoxaparin) around procedure.  **This guidance is not considered finalized until pre-operative APP has relayed final recommendations.**

## 2024-02-20 NOTE — Telephone Encounter (Signed)
 Patient has upcoming appointment and clearance is going to be addressed then and made a note on appointment note that patient needs preop clearance

## 2024-02-21 ENCOUNTER — Telehealth: Payer: Self-pay | Admitting: Physician Assistant

## 2024-02-21 ENCOUNTER — Telehealth: Payer: Self-pay

## 2024-02-21 DIAGNOSIS — M25511 Pain in right shoulder: Secondary | ICD-10-CM | POA: Diagnosis not present

## 2024-02-21 MED ORDER — BREATHE COMFORT CHAMBER/ADULT DEVI: 1.0000 | Freq: Every day | 1 refills | Status: AC | PRN

## 2024-02-21 MED ORDER — EPINASTINE HCL 0.05 % OP SOLN: 1.0000 [drp] | Freq: Every day | OPHTHALMIC | 0 refills | Status: DC | PRN

## 2024-02-21 MED ORDER — FLUTICASONE PROPIONATE 50 MCG/ACT NA SUSP: NASAL | 1 refills | Status: DC

## 2024-02-21 MED ORDER — BUDESONIDE-FORMOTEROL FUMARATE 160-4.5 MCG/ACT IN AERO: 2.0000 | INHALATION_SPRAY | Freq: Two times a day (BID) | RESPIRATORY_TRACT | 0 refills | Status: DC

## 2024-02-21 MED ORDER — TRIAMCINOLONE ACETONIDE 0.1 % EX OINT: 1.0000 | TOPICAL_OINTMENT | Freq: Two times a day (BID) | CUTANEOUS | 0 refills | Status: DC

## 2024-02-21 MED ORDER — NEBULIZER MISC: 1.0000 | 1 refills | Status: AC | PRN

## 2024-02-21 MED ORDER — PANTOPRAZOLE SODIUM 40 MG PO TBEC: 40.0000 mg | DELAYED_RELEASE_TABLET | Freq: Every morning | ORAL | 0 refills | Status: DC

## 2024-02-21 MED ORDER — AZELASTINE HCL 0.1 % NA SOLN: 2.0000 | Freq: Two times a day (BID) | NASAL | 0 refills | Status: DC

## 2024-02-21 MED ORDER — MONTELUKAST SODIUM 10 MG PO TABS: 10.0000 mg | ORAL_TABLET | Freq: Every day | ORAL | 1 refills | Status: DC

## 2024-02-21 MED ORDER — ALBUTEROL SULFATE HFA 108 (90 BASE) MCG/ACT IN AERS: 2.0000 | INHALATION_SPRAY | Freq: Four times a day (QID) | RESPIRATORY_TRACT | 0 refills | Status: DC | PRN

## 2024-02-21 MED ORDER — EPINEPHRINE 0.3 MG/0.3ML IJ SOAJ: 0.3000 mg | INTRAMUSCULAR | 0 refills | Status: AC | PRN

## 2024-02-21 MED ORDER — IPRATROPIUM BROMIDE 0.06 % NA SOLN: 2.0000 | Freq: Four times a day (QID) | NASAL | 5 refills | Status: DC | PRN

## 2024-02-21 MED ORDER — ALBUTEROL SULFATE (2.5 MG/3ML) 0.083% IN NEBU: 2.5000 mg | INHALATION_SOLUTION | RESPIRATORY_TRACT | 0 refills | Status: DC | PRN

## 2024-02-21 MED ORDER — FEXOFENADINE HCL 180 MG PO TABS: 180.0000 mg | ORAL_TABLET | Freq: Every day | ORAL | 0 refills | Status: DC | PRN

## 2024-02-21 MED ORDER — HYDROCORTISONE 2.5 % EX CREA: 1.0000 | TOPICAL_CREAM | Freq: Two times a day (BID) | CUTANEOUS | 0 refills | Status: AC

## 2024-02-21 NOTE — Telephone Encounter (Signed)
 Patient called and expresse tht

## 2024-02-21 NOTE — Telephone Encounter (Signed)
 Chelsea Benson called stating that she is not using [facility] anymore. She switched to [facility]. Please note this in her chart.

## 2024-02-21 NOTE — Telephone Encounter (Signed)
Updated pharmacy profile.

## 2024-02-21 NOTE — Telephone Encounter (Signed)
 Patient called and expressed that she needed her medications switched to [facility] as she has switched pharmacies.

## 2024-02-22 ENCOUNTER — Other Ambulatory Visit: Payer: Self-pay

## 2024-02-22 ENCOUNTER — Other Ambulatory Visit: Payer: Self-pay | Admitting: *Deleted

## 2024-02-22 DIAGNOSIS — E118 Type 2 diabetes mellitus with unspecified complications: Secondary | ICD-10-CM | POA: Diagnosis not present

## 2024-02-22 MED ORDER — DILTIAZEM HCL ER COATED BEADS 300 MG PO CP24: 300.0000 mg | ORAL_CAPSULE | Freq: Every day | ORAL | 0 refills | Status: DC

## 2024-02-22 MED ORDER — ALBUTEROL SULFATE HFA 108 (90 BASE) MCG/ACT IN AERS: 2.0000 | INHALATION_SPRAY | RESPIRATORY_TRACT | 1 refills | Status: AC | PRN

## 2024-02-23 DIAGNOSIS — M25511 Pain in right shoulder: Secondary | ICD-10-CM | POA: Diagnosis not present

## 2024-02-26 ENCOUNTER — Telehealth: Payer: Self-pay | Admitting: Physician Assistant

## 2024-02-26 DIAGNOSIS — F419 Anxiety disorder, unspecified: Secondary | ICD-10-CM

## 2024-02-26 DIAGNOSIS — F410 Panic disorder [episodic paroxysmal anxiety] without agoraphobia: Secondary | ICD-10-CM

## 2024-02-26 DIAGNOSIS — F5101 Primary insomnia: Secondary | ICD-10-CM

## 2024-02-26 DIAGNOSIS — F3181 Bipolar II disorder: Secondary | ICD-10-CM

## 2024-02-26 NOTE — Telephone Encounter (Signed)
 Next visit is 03/28/24. Chelsea Benson states that she has not been able to contact [facility] for three weeks and still can't. She does use [employer]. She is worried about not getting her Clonazepam. She would like to be called at [phone] to find out what she needs to do for the medication.

## 2024-02-27 DIAGNOSIS — I480 Paroxysmal atrial fibrillation: Secondary | ICD-10-CM | POA: Diagnosis not present

## 2024-02-27 DIAGNOSIS — E1136 Type 2 diabetes mellitus with diabetic cataract: Secondary | ICD-10-CM | POA: Diagnosis not present

## 2024-02-27 DIAGNOSIS — M549 Dorsalgia, unspecified: Secondary | ICD-10-CM | POA: Diagnosis not present

## 2024-02-27 DIAGNOSIS — Z01818 Encounter for other preprocedural examination: Secondary | ICD-10-CM | POA: Diagnosis not present

## 2024-02-27 NOTE — Telephone Encounter (Signed)
 Called [facility]. They said patient's meds are due to ship the first of the month. I said that patient was unable to reach them. I was told to have her call:  [phone]   LVM for patient to [facility]. I don't show that [facility] has filled any of her controlled medications, regardless of who the provider is.

## 2024-02-27 NOTE — Telephone Encounter (Signed)
 Called patient and she wants all medications to be sent to [facility]. Will [facility] at [facility] and repend/send as appropriate.

## 2024-02-28 ENCOUNTER — Ambulatory Visit: Payer: 59 | Admitting: Professional Counselor

## 2024-02-28 ENCOUNTER — Other Ambulatory Visit: Payer: Self-pay

## 2024-02-28 DIAGNOSIS — F410 Panic disorder [episodic paroxysmal anxiety] without agoraphobia: Secondary | ICD-10-CM

## 2024-02-28 MED ORDER — MIRTAZAPINE 30 MG PO TABS
ORAL_TABLET | ORAL | 0 refills | Status: DC
Start: 2024-02-28 — End: 2024-05-10

## 2024-02-28 MED ORDER — OXCARBAZEPINE 300 MG PO TABS: 600.0000 mg | ORAL_TABLET | Freq: Every day | ORAL | 0 refills | Status: DC

## 2024-02-28 MED ORDER — ARIPIPRAZOLE 2 MG PO TABS: 4.0000 mg | ORAL_TABLET | Freq: Every day | ORAL | 0 refills | Status: DC

## 2024-02-28 MED ORDER — CLONAZEPAM 0.5 MG PO TABS: ORAL_TABLET | ORAL | 1 refills | Status: DC

## 2024-02-28 MED ORDER — BUSPIRONE HCL 30 MG PO TABS: 30.0000 mg | ORAL_TABLET | Freq: Two times a day (BID) | ORAL | 0 refills | Status: DC

## 2024-02-29 ENCOUNTER — Telehealth: Payer: Self-pay | Admitting: Physician Assistant

## 2024-02-29 ENCOUNTER — Other Ambulatory Visit: Payer: Self-pay | Admitting: *Deleted

## 2024-02-29 DIAGNOSIS — E1140 Type 2 diabetes mellitus with diabetic neuropathy, unspecified: Secondary | ICD-10-CM | POA: Diagnosis not present

## 2024-02-29 DIAGNOSIS — I10 Essential (primary) hypertension: Secondary | ICD-10-CM | POA: Diagnosis not present

## 2024-02-29 DIAGNOSIS — E785 Hyperlipidemia, unspecified: Secondary | ICD-10-CM | POA: Diagnosis not present

## 2024-02-29 DIAGNOSIS — E1159 Type 2 diabetes mellitus with other circulatory complications: Secondary | ICD-10-CM | POA: Diagnosis not present

## 2024-02-29 DIAGNOSIS — I480 Paroxysmal atrial fibrillation: Secondary | ICD-10-CM | POA: Diagnosis not present

## 2024-02-29 DIAGNOSIS — G4733 Obstructive sleep apnea (adult) (pediatric): Secondary | ICD-10-CM | POA: Diagnosis not present

## 2024-02-29 MED ORDER — AZELASTINE HCL 0.1 % NA SOLN: 2.0000 | Freq: Two times a day (BID) | NASAL | 1 refills | Status: DC

## 2024-02-29 NOTE — Telephone Encounter (Signed)
 Pt called at 3:14p.  She said she will get the Clonazepam from [facility] this time but from [facility] next time.  She said she has everything straightened out and doesn't need a call back.  I see the last note, so I'm not sure if she THINKs everything is ok.

## 2024-02-29 NOTE — Telephone Encounter (Signed)
 Pt called today to ask if medications had been sent back to [facility] and they have. She needs to call [facility] and cancel with them. I attempted to cancel but was told patient had to cancel.

## 2024-03-01 ENCOUNTER — Other Ambulatory Visit: Payer: Self-pay | Admitting: Cardiology

## 2024-03-01 DIAGNOSIS — M25511 Pain in right shoulder: Secondary | ICD-10-CM | POA: Diagnosis not present

## 2024-03-04 ENCOUNTER — Other Ambulatory Visit: Payer: Self-pay | Admitting: Cardiology

## 2024-03-04 ENCOUNTER — Encounter (HOSPITAL_BASED_OUTPATIENT_CLINIC_OR_DEPARTMENT_OTHER): Payer: Self-pay

## 2024-03-04 ENCOUNTER — Telehealth: Payer: Self-pay | Admitting: Physician Assistant

## 2024-03-04 ENCOUNTER — Other Ambulatory Visit: Payer: Self-pay

## 2024-03-04 DIAGNOSIS — F5101 Primary insomnia: Secondary | ICD-10-CM

## 2024-03-04 MED ORDER — ZOLPIDEM TARTRATE 10 MG PO TABS: 5.0000 mg | ORAL_TABLET | Freq: Every evening | ORAL | 0 refills | Status: DC | PRN

## 2024-03-04 NOTE — Telephone Encounter (Signed)
 Chelsea Benson called at 9:30 to request refill of her Ambien. Appt 4/24.  Send to  [facility] [place], [place] - [address]

## 2024-03-04 NOTE — Telephone Encounter (Signed)
 Pended Ambien to [facility].

## 2024-03-05 ENCOUNTER — Encounter: Payer: Self-pay | Admitting: Cardiology

## 2024-03-05 ENCOUNTER — Ambulatory Visit: Payer: 59 | Attending: Cardiology | Admitting: Cardiology

## 2024-03-05 ENCOUNTER — Ambulatory Visit: Payer: 59 | Admitting: Psychiatry

## 2024-03-05 ENCOUNTER — Other Ambulatory Visit: Payer: Self-pay | Admitting: Orthopedic Surgery

## 2024-03-05 VITALS — BP 134/84 | HR 92 | Ht 63.0 in | Wt 176.0 lb

## 2024-03-05 DIAGNOSIS — I480 Paroxysmal atrial fibrillation: Secondary | ICD-10-CM | POA: Diagnosis not present

## 2024-03-05 DIAGNOSIS — D6869 Other thrombophilia: Secondary | ICD-10-CM

## 2024-03-05 DIAGNOSIS — I77810 Thoracic aortic ectasia: Secondary | ICD-10-CM

## 2024-03-05 DIAGNOSIS — M1711 Unilateral primary osteoarthritis, right knee: Secondary | ICD-10-CM

## 2024-03-05 DIAGNOSIS — E785 Hyperlipidemia, unspecified: Secondary | ICD-10-CM | POA: Diagnosis not present

## 2024-03-05 DIAGNOSIS — I10 Essential (primary) hypertension: Secondary | ICD-10-CM | POA: Diagnosis not present

## 2024-03-05 DIAGNOSIS — E1169 Type 2 diabetes mellitus with other specified complication: Secondary | ICD-10-CM | POA: Diagnosis not present

## 2024-03-05 DIAGNOSIS — Z01810 Encounter for preprocedural cardiovascular examination: Secondary | ICD-10-CM | POA: Diagnosis not present

## 2024-03-05 NOTE — Assessment & Plan Note (Addendum)
 Scheduled for a right shoulder replacement, considered a low-risk surgery. No additional studies needed for preoperative clearance. Should hold Eliquis for 2-3 days prior to surgery. - Instruct to hold Eliquis for 2-3 days before shoulder replacement surgery  Chelsea Benson perioperative risk of a major cardiac event is  % according to the Revised Cardiac Risk Index (RCRI).  Therefore, she is at low risk for perioperative complications.   Her functional capacity is fair at 3.97 METs according to the Duke Activity Status Index (DASI). Recommendations: According to ACC/AHA guidelines, no further cardiovascular testing needed.  The patient may proceed to surgery at acceptable risk.   Antiplatelet and/or Anticoagulation Recommendations: Not on antiplatelet agent Eliquis (Apixaban) can be held for 2 to 3 days days prior to surgery.  Please resume post op when felt to be safe.

## 2024-03-05 NOTE — Patient Instructions (Addendum)
 Medication Instructions:  No changes   *If you need a refill on your cardiac medications before your next appointment, please call your pharmacy*   Lab Work: [facility] before  Chest CTA of Aorta  .   Testing/Procedures: Will be  in Nov - Dec 2025  Your physician has requested that you have cardiac CTA of aorta . Cardiac computed tomography (CT) is a painless test that uses an x-ray machine to take clear, detailed pictures of your heart.  Please follow instruction sheet as given.   She does not need contrast allergies protocol   Follow-Up: At [facility], you and your health needs are our priority.  As part of our continuing mission to provide you with exceptional heart care, we have created designated Provider Care Teams.  These Care Teams include your primary Cardiologist (physician) and Advanced Practice Providers (APPs -  Physician Assistants and Nurse Practitioners) who all work together to provide you with the care you need, when you need it.     Your next appointment:   12 month(s)  The format for your next appointment:   In Person  Provider:   Bryan Lemma, MD    Other Instructions    You are cleared for  right shoulder surgery from a cardiac standpoint.      s

## 2024-03-05 NOTE — Progress Notes (Unsigned)
 Cardiology Office Note:  .   Date:  03/07/2024  ID:  Chelsea Benson, DOB 1951/03/28, MRN [id number] PCP: Jackelyn Poling, DO  [facility] Providers Cardiologist:  Bryan Lemma, MD     Chief Complaint  Patient presents with   Follow-up    Delayed 55-month follow-up; Needs preop for shoulder surgery   Atrial Fibrillation    Intermittent breakthrough episodes    Patient Profile: .     Chelsea Benson is a obese 73 y.o. female with a PMH notable for PAF, HLD, DM-2, HTN who presents here for delayed 24-month follow-up.  She was initially referred at the request of Jackelyn Poling, DO.  PMH: PAF HTN/HLD/DM-2 OSA, h/o PE   BiPolar II    I last saw Chelsea Benson on September 29, 2021: Was a 33-month follow-up-doing well.  Off-and-on chest comfort but not exertional.  No sensation of A-fib.  No use of PRN diltiazem since increasing the dose to 300 mg.  Just rare skipped beats but nothing prolonged.  Maintaining sinus rhythm and rate control with diltiazem 300 mg daily and on apixaban 5 mg twice daily DOAC.  On HCTZ 25 mg daily along with pravastatin 40 mg daily  She was last seen on 07/20/2022 by Tereso Newcomer, PA -> currently being treated for a proctitis.  Chest x-ray suggested cardiomegaly and therefore echo was ordered.  Maintaining sinus rhythm.  Echo was relatively normal, but there was concern about mild ascending arctic enlargement.  Telephonic preop visit on 07/26/2023 for colonoscopy.  Subjective  Discussed the use of AI scribe software for clinical note transcription with the patient, who gave verbal consent to proceed.  History of Present Illness History of Present Illness Chelsea Benson is a 72 year old female with atrial fibrillation who presents for follow-up of her condition.  She experiences breakthrough episodes of atrial fibrillation with abnormal heart rates, occurring three times in the last three months. Each episode lasts approximately 45 minutes, during  which her heart feels like it's 'beating funny' and fast, accompanied by shortness of breath. She takes an extra dose of diltiazem 60 mg (short acting) during these episodes, which helps resolve the symptoms. She is currently on a 300 mg dose of diltiazem CD daily.  No chest pain, pressure, or tightness during these episodes. Additionally, no issues with shortness of breath when lying flat or waking up short of breath, and no swelling in her legs. No chest tightness, pressure, or pain during physical activities, and no pain in her calves or thighs when walking. She reports sweating during the day and at night, which she attributes to the blankets she uses.  Her medication regimen includes daily use of Symbicort and Arnuity for asthma, with rare use of a rescue inhaler. She is also on Abilify, Buspar, Klonopin (used rarely), an Epipen for allergies, daily Pepcid, Flonase spray, Gemtesa for bladder control, HCTZ 25 mg for blood pressure, metformin for diabetes, pravastatin for cholesterol, and Ozempic.  Her blood pressure typically ranges from 112 to 120 over 80. A recent echocardiogram in September 2023 showed normal heart function with a pump function of 55-60% and no abnormal wall motion. Her ascending aorta was noted to be at the upper limit of normal at 40 mm.  She uses a walker for mobility, which helps her walk faster compared to a cane. When grocery shopping, she uses a cane placed inside the basket for support. No issues with breathlessness during these activities. She denies any episodes  of dizziness, wooziness, or stroke-like symptoms such as sudden weakness, numbness, or vision changes.  Cardiovascular ROS: positive for - irregular heartbeat, rapid heart rate, and - maybe 1episode every couple of months - lasts ~45 min -1 hr - breaks with PRN Diltiazem negative for - chest pain, edema, orthopnea, paroxysmal nocturnal dyspnea, shortness of breath, or syncope/near syncope, TIA/CVA  ROS:  Review  of Systems - Negative except walk with cane or walker; daytime sweating.    Objective   Cardiovascular Medications Include: Diltiazem CD2 300 mg daily with PRN 60 mg tablets up to 2 tablets a day as needed breakthrough A-fib; & Eliquis 5 mg twice daily HCTZ 25 mg daily Pravastatin 20 mg daily DM meds: Metformin XR 500 mg twice daily; Ozempic 2 mg injection-1 mg weekly Other medications reviewed.  Studies Reviewed: Marland Kitchen   EKG Interpretation Date/Time:  Tuesday March 05 2024 08:01:20 EDT Ventricular Rate:  92 PR Interval:  148 QRS Duration:  78 QT Interval:  356 QTC Calculation: 440 R Axis:   -23  Text Interpretation: Normal sinus rhythm Possible Left atrial enlargement Minimal voltage criteria for LVH, may be normal variant ( R in aVL ) When compared with ECG of 11-Feb-2023 15:46, No significant change since last tracing Confirmed by Bryan Lemma ([id number]) on 03/05/2024 8:30:15 AM    LABS Total Cholesterol: 143 (07/12/2023) Triglycerides: 106 (07/12/2023) HDL: 46 (07/12/2023) LDL: 78 (07/12/2023) HbA1c: 6.3 (02/2024) Hemoglobin: 12.9 (02/2024) Potassium: 4.5 (02/2024) Creatinine: 0.7 (02/2024)  RADIOLOGY Chest X-ray: Suggested cardiomegaly  DIAGNOSTIC Echocardiogram (08/2022): EF 55 to 60%.  No RWMA.  GR 1 DD.  Normal RV and RVSP.  Normal RAP.  Normal valves.  ascending aorta measured 40 mm  Coronary CTA 07/16/2020: CAC 0.  There is CAD.  PA dilation.  No comment on thoracic aorta  Risk Assessment/Calculations:    CHA2DS2-VASc Score = 4   This indicates a 4.8% annual risk of stroke. The patient's score is based upon: CHF History: 0 HTN History: 1 Diabetes History: 1 Stroke History: 0 Vascular Disease History: 0 Age Score: 1 Gender Score: 1   Eliquis 5 mg BID     Chelsea Benson perioperative risk of a major cardiac event is 0.4% according to the Revised Cardiac Risk Index (RCRI).  Therefore, she is at low risk for perioperative complications.   Her functional capacity  is fair at 3.97 METs according to the Duke Activity Status Index (DASI). Recommendations: According to ACC/AHA guidelines, no further cardiovascular testing needed.  The patient may proceed to surgery at acceptable risk.   Antiplatelet and/or Anticoagulation Recommendations: Not on antiplatelet agent Eliquis (Apixaban) can be held for 2 to 3 days days prior to surgery.  Please resume post op when felt to be safe.         Physical Exam:   VS:  BP 134/84   Pulse 92   Ht 5\' 3"  (1.6 m)   Wt 176 lb (79.8 kg)   SpO2 95%   BMI 31.18 kg/m    Wt Readings from Last 3 Encounters:  03/05/24 176 lb (79.8 kg)  11/10/23 173 lb 3.2 oz (78.6 kg)  02/11/23 174 lb (78.9 kg)    GEN: Well nourished, well developed in no acute distress; mildly obese but otherwise healthy-appearing.  Somewhat blunted affect NECK: No JVD; No carotid bruits CARDIAC: ; RRR,Normal S1, S2; 1/6C-D SEM at RUSB;  no rubs, gallops; bilateral costochondral tenderness RESPIRATORY:  Clear to auscultation without rales, wheezing or rhonchi ; nonlabored, good air movement.  ABDOMEN: Soft, non-tender, non-distended EXTREMITIES: Trivial edema; No deformity; slow, antalgic gait, walks with walker.     ASSESSMENT AND PLAN: .    Problem List Items Addressed This Visit       Cardiology Problems   Ascending aorta dilation (HCC) (Chronic)   Previous echocardiogram indicated the ascending aorta was at the upper limit of normal at 40 mm, possibly due to an off-angle measurement.  . - Order CT scan of the aorta before the next annual visit to confirm the size of the ascending aorta -Will need chemistry evaluation prior to study.      Relevant Orders   CT ANGIO CHEST AORTA W/ & OR WO/CM & GATING ([place] ONLY)   Basic metabolic panel with GFR   Essential hypertension (Chronic)   Blood pressure is well-controlled, with readings typically between 112-120/80 mmHg. . On HCTZ 25 mg daily along with diltiazem CD 300 mg - Continue HCTZ  and diltiazem CD.        Relevant Orders   EKG 12-Lead (Completed)   Basic metabolic panel with GFR   Hypercoagulable state due to paroxysmal atrial fibrillation (HCC) (Chronic)   CHA2DS2-VASc Score = 4 [CHF History: 0, HTN History: 1, Diabetes History: 1, Stroke History: 0, Vascular Disease History: 0, Age Score: 1, Gender Score: 1].  Therefore, the patient's annual risk of stroke is 4.8 %.     Discussed importance of continued use of Eliquis especially with her having breakthrough spells.  This is for stroke prevention.    However, okay to hold Eliquis 2 to 3 days preop for surgeries or procedures.      Hyperlipidemia associated with type 2 diabetes mellitus (HCC) (Chronic)   Hyperlipidemia Cholesterol levels are well-controlled on pravastatin. Recent labs show total cholesterol at 143 mg/dL, triglycerides at 161 mg/dL, HDL at 46 mg/dL, and LDL at 78 mg/dL. - Continue pravastatin 20 mg for cholesterol management  Diabetes is well-controlled with an A1c of 6.3%. Current management includes metformin and Ozempic, which are effective. - Continue current diabetes management with metformin and Ozempic (this would likely need to be held 1 week prior to surgery)      Paroxysmal atrial fibrillation (HCC); CHA2DS2-VASc score 4 HTN, DM, female, age)-on Eliquis - Primary (Chronic)   Experiences breakthrough episodes approximately every two months, lasting about 45 minutes, characterized by a fast and irregular heartbeat and occasional shortness of breath without chest pain. An extra dose of diltiazem during episodes resolves symptoms. Current management with diltiazem effectively controls frequency and severity of episodes, with no hospital visits required due to short duration.  - Continue current diltiazem regimen: Standing dose of 300 mg diltiazem CD daily with short acting 60 mg tablets for breakthrough A-fib.  Working well. -On Eliquis without any bleeding issues.      Relevant Orders   CT  ANGIO CHEST AORTA W/ & OR WO/CM & GATING ([place] ONLY)     Other   Osteoarthritis of right knee (Chronic)   Instability or osteoarthritis of minutes or walking more so than cardiac issues.      Pre-operative cardiovascular examination   Scheduled for a right shoulder replacement, considered a low-risk surgery. No additional studies needed for preoperative clearance. Should hold Eliquis for 2-3 days prior to surgery. - Instruct to hold Eliquis for 2-3 days before shoulder replacement surgery  Chelsea Benson perioperative risk of a major cardiac event is  % according to the Revised Cardiac Risk Index (RCRI).  Therefore, she is at low  risk for perioperative complications.   Her functional capacity is fair at 3.97 METs according to the Duke Activity Status Index (DASI). Recommendations: According to ACC/AHA guidelines, no further cardiovascular testing needed.  The patient may proceed to surgery at acceptable risk.   Antiplatelet and/or Anticoagulation Recommendations: Not on antiplatelet agent Eliquis (Apixaban) can be held for 2 to 3 days days prior to surgery.  Please resume post op when felt to be safe.               Follow-up Has not been seen in person for a couple of years. Regular follow-up is necessary to monitor conditions and adjust treatment as needed. CT scan planned to be done before the next visit to discuss results. - Schedule follow-up visit in March next year - Perform CT scan of the aorta in February before the follow-up visit - Check blood work including kidney function and cholesterol levels before the CT scan Follow-Up: Return in about 1 year (around 03/05/2025) for Routine follow up with me, [employer].  Recording duration: 23 minutes Total time spent: 23 min spent with patient + 20 min spent charting = 43 min I spent 43 minutes in the care of Chelsea Benson today including reviewing labs (2 minutes), reviewing studies (3 minutes), face to  face time discussing treatment options (23 minutes), reviewing records from visit with PA (3 minutes), 12 minutes dictating, and documenting in the encounter.      Signed, Marykay Lex, MD, MS Bryan Lemma, M.D., M.S. Interventional Cardiologist  [facility]  Pager # [phone] Phone # [phone] [address] [place], [place] [address]

## 2024-03-07 ENCOUNTER — Encounter: Payer: Self-pay | Admitting: Cardiology

## 2024-03-07 ENCOUNTER — Ambulatory Visit: Payer: 59 | Admitting: Psychiatry

## 2024-03-07 ENCOUNTER — Other Ambulatory Visit: Payer: Self-pay | Admitting: Cardiology

## 2024-03-07 DIAGNOSIS — F3181 Bipolar II disorder: Secondary | ICD-10-CM

## 2024-03-07 DIAGNOSIS — D6869 Other thrombophilia: Secondary | ICD-10-CM | POA: Insufficient documentation

## 2024-03-07 NOTE — Assessment & Plan Note (Addendum)
 Hyperlipidemia Cholesterol levels are well-controlled on pravastatin. Recent labs show total cholesterol at 143 mg/dL, triglycerides at 161 mg/dL, HDL at 46 mg/dL, and LDL at 78 mg/dL. - Continue pravastatin 20 mg for cholesterol management  Diabetes is well-controlled with an A1c of 6.3%. Current management includes metformin and Ozempic, which are effective. - Continue current diabetes management with metformin and Ozempic (this would likely need to be held 1 week prior to surgery)

## 2024-03-07 NOTE — Assessment & Plan Note (Signed)
 Instability or osteoarthritis of minutes or walking more so than cardiac issues.

## 2024-03-07 NOTE — Assessment & Plan Note (Signed)
 Blood pressure is well-controlled, with readings typically between 112-120/80 mmHg. . On HCTZ 25 mg daily along with diltiazem CD 300 mg - Continue HCTZ and diltiazem CD.

## 2024-03-07 NOTE — Assessment & Plan Note (Signed)
 CHA2DS2-VASc Score = 4 [CHF History: 0, HTN History: 1, Diabetes History: 1, Stroke History: 0, Vascular Disease History: 0, Age Score: 1, Gender Score: 1].  Therefore, the patient's annual risk of stroke is 4.8 %.     Discussed importance of continued use of Eliquis especially with her having breakthrough spells.  This is for stroke prevention.    However, okay to hold Eliquis 2 to 3 days preop for surgeries or procedures.

## 2024-03-07 NOTE — Assessment & Plan Note (Signed)
 Previous echocardiogram indicated the ascending aorta was at the upper limit of normal at 40 mm, possibly due to an off-angle measurement.  . - Order CT scan of the aorta before the next annual visit to confirm the size of the ascending aorta -Will need chemistry evaluation prior to study.
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
## 2024-03-07 NOTE — Assessment & Plan Note (Signed)
 Experiences breakthrough episodes approximately every two months, lasting about 45 minutes, characterized by a fast and irregular heartbeat and occasional shortness of breath without chest pain. An extra dose of diltiazem during episodes resolves symptoms. Current management with diltiazem effectively controls frequency and severity of episodes, with no hospital visits required due to short duration.  - Continue current diltiazem regimen: Standing dose of 300 mg diltiazem CD daily with short acting 60 mg tablets for breakthrough A-fib.  Working well. -On Eliquis without any bleeding issues.

## 2024-03-11 DIAGNOSIS — R252 Cramp and spasm: Secondary | ICD-10-CM | POA: Diagnosis not present

## 2024-03-18 DIAGNOSIS — M62838 Other muscle spasm: Secondary | ICD-10-CM | POA: Diagnosis not present

## 2024-03-18 DIAGNOSIS — M47816 Spondylosis without myelopathy or radiculopathy, lumbar region: Secondary | ICD-10-CM | POA: Diagnosis not present

## 2024-03-18 DIAGNOSIS — G894 Chronic pain syndrome: Secondary | ICD-10-CM | POA: Diagnosis not present

## 2024-03-19 DIAGNOSIS — G894 Chronic pain syndrome: Secondary | ICD-10-CM | POA: Diagnosis not present

## 2024-03-19 DIAGNOSIS — M62838 Other muscle spasm: Secondary | ICD-10-CM | POA: Diagnosis not present

## 2024-03-19 DIAGNOSIS — M47816 Spondylosis without myelopathy or radiculopathy, lumbar region: Secondary | ICD-10-CM | POA: Diagnosis not present

## 2024-03-21 ENCOUNTER — Other Ambulatory Visit: Payer: Self-pay | Admitting: Orthopedic Surgery

## 2024-03-28 ENCOUNTER — Encounter: Payer: Self-pay | Admitting: Physician Assistant

## 2024-03-28 ENCOUNTER — Ambulatory Visit: Admitting: Physician Assistant

## 2024-03-28 ENCOUNTER — Ambulatory Visit (HOSPITAL_COMMUNITY): Admit: 2024-03-28 | Admitting: Orthopedic Surgery

## 2024-03-28 DIAGNOSIS — F419 Anxiety disorder, unspecified: Secondary | ICD-10-CM

## 2024-03-28 DIAGNOSIS — F5101 Primary insomnia: Secondary | ICD-10-CM | POA: Diagnosis not present

## 2024-03-28 DIAGNOSIS — F3181 Bipolar II disorder: Secondary | ICD-10-CM | POA: Diagnosis not present

## 2024-03-28 SURGERY — ARTHROPLASTY, SHOULDER, TOTAL, REVERSE
Anesthesia: Choice | Site: Shoulder | Laterality: Right

## 2024-03-28 MED ORDER — ZOLPIDEM TARTRATE 10 MG PO TABS: 5.0000 mg | ORAL_TABLET | Freq: Every evening | ORAL | 1 refills | Status: DC | PRN

## 2024-03-28 NOTE — Progress Notes (Signed)
 [facility]  Patient ID: Chelsea Benson,  MRN: [id number]  PCP: Mordechai April, DO  Date of Evaluation: 03/28/2024 Time spent:30 minutes  Chief Complaint:  Chief Complaint   Anxiety; Depression; Insomnia; Follow-up    HISTORY/CURRENT STATUS: HPI Transfer to my care from Roselyn Connor, NP, who is no longer with the practice.  Not sleeping well. The last Rx of ambien  was a different color and doesn't work like the other one. She can fall asleep but can't stay asleep.  Might get about 4 hours per night and then naps 1-2 hours during the day. No caffeine.   Patient is able to enjoy things.  Likes to read and watch tv. Likes to [employer], bake, cleans her house.  Energy and motivation are good.  No extreme sadness, tearfulness, or feelings of hopelessness.  ADLs and personal hygiene are normal.   Denies any changes in concentration, making decisions, or remembering things.  Appetite has not changed.  Weight is stable.  Anxiety is controlled.   Denies suicidal or homicidal thoughts.  Patient denies increased energy with decreased need for sleep, increased talkativeness, racing thoughts, impulsivity or risky behaviors, increased spending, increased libido, grandiosity, increased irritability or anger, paranoia, or hallucinations.  Denies dizziness, syncope, seizures, numbness, tingling, tremor, tics, unsteady gait, slurred speech, confusion.   Individual Medical History/ Review of Systems: Changes? :Yes  is having right shoulder replacement surgery in 3 weeks.  Past Psychiatric Medication Trials: Cymbalta-hair loss Mirtazapine  Prozac  Trazodone -ineffective Ambien  Belsomra  BuSpar  Hydroxyzine  Abilify  Trileptal  Xanax  Allergies: Ramipril , Cymbalta [duloxetine hcl], Methylprednisolone  acetate, Rosuvastatin calcium , Almond meal (obsolete), Almond oil, Carvedilol, Fish allergy, Losartan potassium, and Morphine and codeine  Current Medications:  Current Outpatient Medications:     ACCU-CHEK GUIDE test strip, TEST BID PRN, Disp: , Rfl: 1   albuterol  (PROVENTIL ) (2.5 MG/3ML) 0.083% nebulizer solution, Take 3 mLs (2.5 mg total) by nebulization every 4 (four) hours as needed for wheezing or shortness of breath. USE 1 VIAL IN NEBULIZER EVERY 4 HOURS - and as needed, Disp: 150 mL, Rfl: 0   albuterol  (VENTOLIN  HFA) 108 (90 Base) MCG/ACT inhaler, Inhale 2 puffs into the lungs every 4 (four) hours as needed for wheezing or shortness of breath., Disp: 54 g, Rfl: 1   apixaban  (ELIQUIS ) 5 MG TABS tablet, TAKE ONE TABLET (5 MG TOTAL) BY MOUTH TWICE DAILY, Disp: 200 tablet, Rfl: 1   ARIPiprazole  (ABILIFY ) 2 MG tablet, Take 2 tablets (4 mg total) by mouth daily., Disp: 180 tablet, Rfl: 0   azelastine  (ASTELIN ) 0.1 % nasal spray, Place 2 sprays into both nostrils 2 (two) times daily., Disp: 90 mL, Rfl: 1   BISACODYL  5 MG EC tablet, Take 5 mg by mouth once., Disp: , Rfl:    Blood Glucose Monitoring Suppl (ACCU-CHEK GUIDE) w/Device KIT, See admin instructions., Disp: , Rfl: 1   budesonide -formoterol  (SYMBICORT ) 160-4.5 MCG/ACT inhaler, Inhale 2 puffs into the lungs in the morning and at bedtime. INHALE 2 INHALATIONS BY MOUTH  INTO THE LUNGS IN THE MORNING  AND AT BEDTIME, Disp: 30.6 g, Rfl: 0   busPIRone  (BUSPAR ) 30 MG tablet, Take 1 tablet (30 mg total) by mouth 2 (two) times daily., Disp: 180 tablet, Rfl: 0   CALCIUM -VITAMIN D  PO, Take 1 tablet by mouth daily., Disp: , Rfl:    clonazePAM  (KLONOPIN ) 0.5 MG tablet, Take 1/2-1 tab po TID prn anxiety. (Do not combine with Ambien ), Disp: 45 tablet, Rfl: 1   desonide  (DESOWEN ) 0.05 % ointment, APPLY TO THE  AFFECTED AREA(S) TOPICALLY TWICE DAILY AS NEEDED, Disp: 180 g, Rfl: 0   diltiazem  (CARDIZEM ) 60 MG tablet, TAKE 1 TABLET BY MOUTH EVERY 6 HOURS AS NEEDED FOR PALPITATION EDISODE. UP TO 2 TABLETS PER DAY, Disp: 60 tablet, Rfl: 1   diltiazem  (CARTIA  XT) 300 MG 24 hr capsule, TAKE 1 CAPSULE BY MOUTH DAILY, Disp: 90 capsule, Rfl: 3   Epinastine  HCl 0.05 % ophthalmic solution, Place 1 drop into both eyes daily as needed (itchy/watery eyes)., Disp: 15 mL, Rfl: 0   EPINEPHrine  0.3 mg/0.3 mL IJ SOAJ injection, Inject 0.3 mg into the muscle as needed for anaphylaxis. INJECT INTRAMUSCULARLY 1  PEN AS NEEDED FOR ALLERGIC  RESPONSE AS DIRECTED BY MD. SEEK MEDICAL HELP AFTER  USE., Disp: 2 each, Rfl: 0   famotidine  (PEPCID ) 20 MG tablet, TAKE 1 TABLET BY MOUTH DAILY  AFTER SUPPER, Disp: 100 tablet, Rfl: 2   ferrous sulfate 325 (65 FE) MG tablet, Take 325 mg by mouth daily with breakfast., Disp: , Rfl:    fexofenadine  (ALLEGRA ) 180 MG tablet, Take 1 tablet (180 mg total) by mouth daily., Disp: 90 tablet, Rfl: 1   fluticasone  (FLONASE ) 50 MCG/ACT nasal spray, USE 2 SPRAYS IN BOTH NOSTRILS  DAILY, Disp: 48 g, Rfl: 1   Fluticasone  Furoate (ARNUITY ELLIPTA ) 100 MCG/ACT AEPB, Inhale 1 puff into the lungs daily as needed., Disp: 30 each, Rfl: 5   GEMTESA 75 MG TABS, Take 1 tablet by mouth daily., Disp: , Rfl:    hydrochlorothiazide  (HYDRODIURIL ) 25 MG tablet, Take 1 tablet (25 mg total) by mouth daily., Disp: 30 tablet, Rfl: 0   hydrocortisone  2.5 % cream, Apply 1 Application topically 2 (two) times daily., Disp: 30 g, Rfl: 0   ipratropium (ATROVENT ) 0.06 % nasal spray, Place 2 sprays into both nostrils 4 (four) times daily as needed for rhinitis., Disp: 15 mL, Rfl: 5   Lancets (ONETOUCH DELICA PLUS LANCET33G) MISC, SMARTSIG:1 Topical Daily, Disp: , Rfl:    lidocaine  (LIDODERM ) 5 %, Place 1 patch onto the skin daily., Disp: , Rfl:    Mepolizumab  (NUCALA ) 100 MG/ML SOAJ, Inject 1 mL (100 mg total) into the skin every 28 (twenty-eight) days., Disp: 1 mL, Rfl: 11   metFORMIN  (GLUCOPHAGE -XR) 500 MG 24 hr tablet, Take 500 mg by mouth in the morning and at bedtime., Disp: , Rfl:    methocarbamol  (ROBAXIN ) 500 MG tablet, Take 500 mg by mouth as needed for muscle spasms., Disp: , Rfl:    mirtazapine  (REMERON ) 30 MG tablet, TAKE 1 TABLET BY MOUTH AT  BEDTIME, Disp:  90 tablet, Rfl: 0   montelukast  (SINGULAIR ) 10 MG tablet, Take 1 tablet (10 mg total) by mouth at bedtime., Disp: 90 tablet, Rfl: 1   Nebulizer MISC, 1 Device by Does not apply route as needed., Disp: 1 each, Rfl: 1   omeprazole (PRILOSEC) 20 MG capsule, omeprazole 20 mg capsule,delayed release TK ONE C PO  BID, Disp: , Rfl:    Oxcarbazepine  (TRILEPTAL ) 300 MG tablet, Take 2 tablets (600 mg total) by mouth at bedtime., Disp: 180 tablet, Rfl: 0   potassium chloride  SA (KLOR-CON  M) 20 MEQ tablet, Take 1 tablet (20 mEq total) by mouth daily., Disp: 30 tablet, Rfl: 0   pravastatin  (PRAVACHOL ) 20 MG tablet, Take 1 tablet (20 mg total) by mouth at bedtime., Disp: 90 tablet, Rfl: 3   pregabalin  (LYRICA ) 100 MG capsule, Take 100 mg by mouth 3 (three) times daily., Disp: , Rfl:    Respiratory  Therapy Supplies (NEBULIZER) DEVI, Use as directed with nebulizer solution., Disp: 1 each, Rfl: 1   Respiratory Therapy Supplies (NEBULIZER/TUBING/MOUTHPIECE) KIT, Use as directed with nebulizer machine, Disp: 1 kit, Rfl: 12   Semaglutide , 1 MG/DOSE, (OZEMPIC , 1 MG/DOSE,) 2 MG/1.5ML SOPN, Take 1 mg by mouth once a week., Disp: , Rfl:    Spacer/Aero-Holding Chambers (BREATHE COMFORT CHAMBER/ADULT) DEVI, 1 each by Does not apply route daily as needed., Disp: 1 each, Rfl: 1   tiZANidine  (ZANAFLEX ) 2 MG tablet, , Disp: , Rfl:    traMADol (ULTRAM) 50 MG tablet, Take 50 mg by mouth every 8 (eight) hours as needed., Disp: , Rfl:    triamcinolone  (NASACORT ) 55 MCG/ACT AERO nasal inhaler, Place 2 sprays into the nose daily., Disp: 16.9 mL, Rfl: 5   triamcinolone  ointment (KENALOG ) 0.1 %, Apply 1 Application topically 2 (two) times daily., Disp: 30 g, Rfl: 0   Carbinoxamine  Maleate 4 MG TABS, Take 1 tablet (4 mg total) by mouth 3 (three) times daily as needed. (Patient not taking: Reported on 03/28/2024), Disp: 180 tablet, Rfl: 1   pantoprazole  (PROTONIX ) 40 MG tablet, Take 1 tablet (40 mg total) by mouth every morning. (Patient  not taking: Reported on 03/28/2024), Disp: 90 tablet, Rfl: 0   zolpidem  (AMBIEN ) 10 MG tablet, Take 0.5-1 tablets (5-10 mg total) by mouth at bedtime as needed. for sleep, Disp: 90 tablet, Rfl: 1 Medication Side Effects: none  Family Medical/ Social History: Changes? No  MENTAL HEALTH EXAM:  There were no vitals taken for this visit.There is no height or weight on file to calculate BMI.  General Appearance: Casual and Well Groomed  Eye Contact:  Good  Speech:  Clear and Coherent and Normal Rate  Volume:  Normal  Mood:  Euthymic  Affect:  Congruent  Thought Process:  Goal Directed and Descriptions of Associations: Circumstantial  Orientation:  Full (Time, Place, and Person)  Thought Content: Logical   Suicidal Thoughts:  No  Homicidal Thoughts:  No  Memory:  WNL  Judgement:  Good  Insight:  Good  Psychomotor Activity:   Walks slowly with a rolling walker  Concentration:  Concentration: Good  Recall:  Good  Fund of Knowledge: Good  Language: Good  Assets:  Communication Skills Desire for Improvement Financial Resources/Insurance Housing  ADL's:  Intact  Cognition: WNL  Prognosis:  Good   PCP follows labs  DIAGNOSES:    ICD-10-CM   1. Bipolar II disorder (HCC)  F31.81     2. Primary insomnia  F51.01 zolpidem  (AMBIEN ) 10 MG tablet    3. Anxiety disorder, unspecified type  F41.9       Receiving Psychotherapy: Yes with Reid Capuchin, LCSW  RECOMMENDATIONS:   PDMP reviewed.  Klonopin  filled 03/14/2024.  Also on tramadol and Lyrica .  Ambien  filled 11/15/2023. I provided  30 minutes of face to face time during this encounter, including time spent before and after the visit in records review, medical decision making, counseling pertinent to today's visit, and charting.   We discussed the insomnia.  Sleep hygiene discussed.  When she gets the Ambien  from [facility] the brand is correct and it is more effective.  I will send the next prescription there.  She understands the  increased risk of falls and confusion with Ambien  and Klonopin  and accepts those risks.  She is doing well as far as her mental health medications go so no changes will be made.  Continue Abilify  2 mg, 2 p.o. daily. Continue BuSpar  30  mg, 1 p.o. twice daily. Continue Klonopin  0.5 mg, 1/2-1 p.o. 3 times daily as needed. Continue mirtazapine  30 mg, 1 p.o. nightly. Continue Trileptal  300 mg, 2 p.o. nightly. Continue Ambien  10 mg, 1/2-1 p.o. nightly as needed sleep. Continue therapy with Reid Capuchin, LCSW. Return in 3 months.  Telehealth is okay.  Marvia Slocumb, PA-C

## 2024-03-31 NOTE — Patient Instructions (Addendum)
 SURGICAL WAITING ROOM VISITATION Patients having surgery or a procedure may have no more than 2 support people in the waiting area - these visitors may rotate in the visitor waiting room.   If the patient needs to stay at the hospital during part of their recovery, the visitor guidelines for inpatient rooms apply.  PRE-OP VISITATION  Pre-op nurse will coordinate an appropriate time for 1 support person to accompany the patient in pre-op.  This support person may not rotate.  This visitor will be contacted when the time is appropriate for the visitor to come back in the pre-op area.  Please refer to the [facility] website for the visitor guidelines for Inpatients (after your surgery is over and you are in a regular room).  You are not required to quarantine at this time prior to your surgery. However, you must do this: Hand Hygiene often Do NOT share personal items Notify your provider if you are in close contact with someone who has COVID or you develop fever 100.4 or greater, new onset of sneezing, cough, sore throat, shortness of breath or body aches.  If you test positive for Covid or have been in contact with anyone that has tested positive in the last 10 days please notify you surgeon.    Your procedure is scheduled on:  Thursday  04-11-24  Report to [facility] Main Entrance: [address] entrance where the [employer] is available.   Report to admitting at: 07:30    AM  Call this number if you have any questions or problems the morning of surgery [phone]  FOLLOW ANY ADDITIONAL PRE OP INSTRUCTIONS YOU RECEIVED FROM YOUR SURGEON'S OFFICE!!!  Do not eat food after Midnight the night prior to your surgery/procedure.  After Midnight you may have the following liquids until  07:00  AM  DAY OF SURGERY  Clear Liquid Diet Water Black Coffee (sugar ok, NO MILK/CREAM OR CREAMERS)  Tea (sugar ok, NO MILK/CREAM OR CREAMERS) regular and decaf                             Plain  Jell-O  with no fruit (NO RED)                                           Fruit ices (not with fruit pulp, NO RED)                                     Popsicles (NO RED)                                                                  Juice: NO CITRUS JUICES: only apple, WHITE grape, WHITE cranberry Sports drinks like Gatorade or Powerade (NO RED)                   The day of surgery:  Drink ONE (1) Pre-Surgery G2 at   07:00 AM the morning of surgery. Drink in one sitting. Do not sip.  This drink was given to  you during your hospital pre-op appointment visit. Nothing else to drink after completing the Pre-Surgery G2 : No candy, chewing gum or throat lozenges.     Oral Hygiene is also important to reduce your risk of infection.        Remember - BRUSH YOUR TEETH THE MORNING OF SURGERY WITH YOUR REGULAR TOOTHPASTE  Do NOT smoke after Midnight the night before surgery.  STOP TAKING all Vitamins, Herbs and supplements 1 week before your surgery.   METFORMIN -  DO NOT TAKE ON THE DAY OF SURGERY. OZEMPIC - Last Injection was on 03-31-2024, Don't inject again until after your surgery.   ELIQUIS -  Stop taking 72 hours before your surgery, last dose will be taken on Sunday 04-07-24  Take ONLY these medicines the morning of surgery with A SIP OF WATER: Diltiazem , Aripiprazole , Gemtesa, Fexofenadine , Buspirone , Pantoprazole , Pregabalin  and Clonazepam  if needed. You may take EITHER Tylenol  OR Tramadol if needed for pain. You may use your inhalers, nebulizer and nasal sprays if needed. Please bring your Albuterol  inhaler with you on the day of surgery.    If You have been diagnosed with Sleep Apnea - Bring CPAP mask and tubing day of surgery. We will provide you with a CPAP machine on the day of your surgery.                   You may not have any metal on your body including hair pins, jewelry, and body piercing  Do not wear make-up, lotions, powders, perfumes  or deodorant  Do not wear nail polish  including gel and S&S, artificial / acrylic nails, or any other type of covering on natural nails including finger and toenails. If you have artificial nails, gel coating, etc., that needs to be removed by a nail salon, Please have this removed prior to surgery. Not doing so may mean that your surgery could be cancelled or delayed if the Surgeon or anesthesia staff feels like they are unable to monitor you safely.   Do not shave 48 hours prior to surgery to avoid nicks in your skin which may contribute to postoperative infections.   Contacts, Hearing Aids, dentures or bridgework may not be worn into surgery. DENTURES WILL BE REMOVED PRIOR TO SURGERY PLEASE DO NOT APPLY "Poly grip" OR ADHESIVES!!!  You may bring a small overnight bag with you on the day of surgery, only pack items that are not valuable. [facility] IS NOT RESPONSIBLE   FOR VALUABLES THAT ARE LOST OR STOLEN.   Patients discharged on the day of surgery will not be allowed to drive home.  Someone NEEDS to stay with you for the first 24 hours after anesthesia.  Do not bring your home medications to the hospital. The Pharmacy will dispense medications listed on your medication list to you during your admission in the Hospital.  Please read over the following fact sheets you were given: IF YOU HAVE QUESTIONS ABOUT YOUR PRE-OP INSTRUCTIONS, PLEASE CALL [phone]   SINCE YOU HAVE A CHG (chlorahexidine gluconate) ALLERGY Please follow the following instructions:   [place] - Preparing for Surgery Before surgery, you can play an important role.  Because skin is not sterile, your skin needs to be as free of germs as possible.  You can reduce the number of germs on your skin by washing with Antibacterial soap before surgery.  . Do not shave (including legs and underarms) for at least 48 hours prior to the first shower.  You may shave  your face/neck.  Please follow these instructions carefully:  1.  Shower with antibacterial Soap the  night before surgery and the  morning of surgery.  2.  If you choose to wash your hair, wash your hair first as usual with your normal  shampoo.  3.  After you shampoo, rinse your hair and body thoroughly to remove the shampoo.                             4.  You can apply soap directly to the skin and wash.  Gently with a scrungie or clean washcloth.  5.  Wash face,  Genitals (private parts) with your normal soap.             6.  Wash thoroughly, paying special attention to the area where your  surgery  will be performed.  7.  Thoroughly rinse your body with warm water from the neck down.  8.   Pat yourself dry with a clean towel.             9  Wear clean pajamas.            10 Place clean sheets on your bed the night of your first shower and do not  sleep with pets.    Preparing for Total Shoulder Arthroplasty ================================================================= Please follow these instructions carefully, in addition to any other special Bathing information that was explained to you at the Presurgical Appointment:  BENZOYL PEROXIDE 5% GEL: Used to kill bacteria on the skin which could cause an infection at the surgery site.   Please do not use if you have an allergy to benzoyl peroxide. If your skin becomes reddened/irritated stop using the benzoyl peroxide and inform your Doctor.   Starting two days before surgery, apply as follows:  1. Apply benzoyl peroxide gel in the morning and at night. Apply after taking a shower. If you are not taking a shower, clean entire shoulder front, back, and side, along with the armpit with a clean wet washcloth.  2. Place a quarter-sized dollop of the gel on your SHOULDER and rub in thoroughly, making sure to cover the front, back, and side of your shoulder, along with the armpit.   2 Days prior to Surgery  TUESDAY  April 02, 2024 First Application _______ Morning Second Application _______ Night  Day Before Surgery       WEDNESDAY   April 03, 2024 First Application______ Morning  On the night before surgery, wash your entire body (except hair, face and private areas) with CHG Soap. THEN, rub in the LAST application of the Benzoyl Peroxide Gel on your shoulder.   3. On the Morning of Surgery wash your BODY AGAIN with CHG Soap (except hair, face and private areas)  4. DO NOT USE THE BENZOYL PEROXIDE GEL ON THE DAY OF YOUR SURGERY    ON THE DAY OF SURGERY : Do not apply any lotions/deodorants the morning of surgery.  Please wear clean clothes to the hospital/surgery center.     FAILURE TO FOLLOW THESE INSTRUCTIONS MAY RESULT IN THE CANCELLATION OF YOUR SURGERY  PATIENT SIGNATURE_________________________________  NURSE SIGNATURE__________________________________  ________________________________________________________________________         Benjamen Brand    An incentive spirometer is a tool that can help keep your lungs clear and active. This tool measures how well you are filling your lungs with each breath. Taking long deep breaths may help reverse or decrease  the chance of developing breathing (pulmonary) problems (especially infection) following: A long period of time when you are unable to move or be active. BEFORE THE PROCEDURE  If the spirometer includes an indicator to show your best effort, your nurse or respiratory therapist will set it to a desired goal. If possible, sit up straight or lean slightly forward. Try not to slouch. Hold the incentive spirometer in an upright position. INSTRUCTIONS FOR USE  Sit on the edge of your bed if possible, or sit up as far as you can in bed or on a chair. Hold the incentive spirometer in an upright position. Breathe out normally. Place the mouthpiece in your mouth and seal your lips tightly around it. Breathe in slowly and as deeply as possible, raising the piston or the ball toward the top of the column. Hold your breath for 3-5 seconds or for as  long as possible. Allow the piston or ball to fall to the bottom of the column. Remove the mouthpiece from your mouth and breathe out normally. Rest for a few seconds and repeat Steps 1 through 7 at least 10 times every 1-2 hours when you are awake. Take your time and take a few normal breaths between deep breaths. The spirometer may include an indicator to show your best effort. Use the indicator as a goal to work toward during each repetition. After each set of 10 deep breaths, practice coughing to be sure your lungs are clear. If you have an incision (the cut made at the time of surgery), support your incision when coughing by placing a pillow or rolled up towels firmly against it. Once you are able to get out of bed, walk around indoors and cough well. You may stop using the incentive spirometer when instructed by your caregiver.  RISKS AND COMPLICATIONS Take your time so you do not get dizzy or light-headed. If you are in pain, you may need to take or ask for pain medication before doing incentive spirometry. It is harder to take a deep breath if you are having pain. AFTER USE Rest and breathe slowly and easily. It can be helpful to keep track of a log of your progress. Your caregiver can provide you with a simple table to help with this. If you are using the spirometer at home, follow these instructions: SEEK MEDICAL CARE IF:  You are having difficultly using the spirometer. You have trouble using the spirometer as often as instructed. Your pain medication is not giving enough relief while using the spirometer. You develop fever of 100.5 F (38.1 C) or higher.                                                                                                    SEEK IMMEDIATE MEDICAL CARE IF:  You cough up bloody sputum that had not been present before. You develop fever of 102 F (38.9 C) or greater. You develop worsening pain at or near the incision site. MAKE SURE YOU:  Understand these  instructions. Will watch your condition. Will get help right away if you are  not doing well or get worse. Document Released: 04/03/2007 Document Revised: 02/13/2012 Document Reviewed: 06/04/2007 [facility] Patient Information 2014 [place], [place].       If you would like to see a video about joint replacement:   [email or web address]

## 2024-03-31 NOTE — Progress Notes (Signed)
 COVID Vaccine received:  []  No [x]  Yes Date of any COVID positive Test in last 90 days:  PCP - Mordechai April, DO  Cardiologist - Randene Bustard, MD cardiac clearance 03-05-24 Epic note   Chest x-ray - 01-22-2023 2v Epic   MD ordered repeat EKG -  03-05-2024  Epic Stress Test - 12-2008  Epic ECHO - 08-19-2022  Epic Cardiac Cath -   Pacemaker / ICD device [x]  No []  Yes   Spinal Cord Stimulator:[x]  No []  Yes       History of Sleep Apnea? []  No [x]  Yes   CPAP used?- []  No [x]  Yes    Does the patient monitor blood sugar?   []  N/A   []  No []  Yes  Patient has: []  NO Hx DM   []  Pre-DM   []  DM1  [x]   DM2 Last A1c was: 6.6  on    12-23-2021  Does patient have a [employer] or Dexcom? []  No []  Yes   Fasting Blood Sugar Ranges-  Checks Blood Sugar _____ times a day Metformin - Hold DOS Ozempic  - hold 7-10 days  Blood Thinner / Instructions:  Eliquis  Hold x 72 hours   last dose: 04-07-24  Aspirin  Instructions:  none  ERAS Protocol Ordered: []  No  [x]  Yes PRE-SURGERY []  ENSURE  [x]  G2  Patient is to be NPO after: 0700  Dental hx: []  Dentures:  []  N/A      []  Bridge or Partial:                   []  Loose or Damaged teeth:   Comments: The patient was given Benzoyl peroxide Gel as ordered. Instruction regarding application starting 2 days prior to surgery was given and patient voiced understanding.   Patient may have CHG allergy (Rash w/ polyethylene glycol) so did not give her any CHG.   Activity level: Patient is able / unable to climb a flight of stairs without difficulty; []  No CP  []  No SOB, but would have ___   Patient can / can not perform ADLs without assistance.   Anesthesia review: DM2, OSA-CPAP, Bipolar II, HTN, A. Fib, Hx PE, asthma, anemia.   Patient denies shortness of breath, fever, cough and chest pain at PAT appointment.  Patient verbalized understanding and agreement to the Pre-Surgical Instructions that were given to them at this PAT appointment. Patient was also educated of  the need to review these PAT instructions again prior to her surgery.I reviewed the appropriate phone numbers to call if they have any and questions or concerns.

## 2024-04-01 ENCOUNTER — Ambulatory Visit: Admitting: Psychiatry

## 2024-04-01 NOTE — Progress Notes (Signed)
 Patient not able to have session today.   No charge.  Am working with her to get her rescheduled for a telehealth session.

## 2024-04-02 ENCOUNTER — Other Ambulatory Visit: Payer: Self-pay

## 2024-04-02 ENCOUNTER — Encounter (HOSPITAL_COMMUNITY)
Admission: RE | Admit: 2024-04-02 | Discharge: 2024-04-02 | Disposition: A | Discharge status: Home / Self Care | Source: Ambulatory Visit | Attending: Orthopedic Surgery | Admitting: Orthopedic Surgery

## 2024-04-02 ENCOUNTER — Ambulatory Visit (HOSPITAL_COMMUNITY)
Admission: RE | Admit: 2024-04-02 | Discharge: 2024-04-02 | Disposition: A | Discharge status: Home / Self Care | Source: Ambulatory Visit | Attending: Orthopedic Surgery | Admitting: Orthopedic Surgery

## 2024-04-02 ENCOUNTER — Encounter (HOSPITAL_COMMUNITY): Payer: Self-pay

## 2024-04-02 VITALS — BP 167/104 | HR 89 | Temp 97.7°F | Resp 16 | Ht 63.0 in | Wt 173.5 lb

## 2024-04-02 DIAGNOSIS — Z79899 Other long term (current) drug therapy: Secondary | ICD-10-CM | POA: Diagnosis not present

## 2024-04-02 DIAGNOSIS — G4733 Obstructive sleep apnea (adult) (pediatric): Secondary | ICD-10-CM | POA: Insufficient documentation

## 2024-04-02 DIAGNOSIS — F419 Anxiety disorder, unspecified: Secondary | ICD-10-CM | POA: Insufficient documentation

## 2024-04-02 DIAGNOSIS — I10 Essential (primary) hypertension: Secondary | ICD-10-CM | POA: Insufficient documentation

## 2024-04-02 DIAGNOSIS — S46011A Strain of muscle(s) and tendon(s) of the rotator cuff of right shoulder, initial encounter: Secondary | ICD-10-CM | POA: Diagnosis not present

## 2024-04-02 DIAGNOSIS — Z7901 Long term (current) use of anticoagulants: Secondary | ICD-10-CM | POA: Insufficient documentation

## 2024-04-02 DIAGNOSIS — I480 Paroxysmal atrial fibrillation: Secondary | ICD-10-CM | POA: Diagnosis not present

## 2024-04-02 DIAGNOSIS — F319 Bipolar disorder, unspecified: Secondary | ICD-10-CM | POA: Insufficient documentation

## 2024-04-02 DIAGNOSIS — E1169 Type 2 diabetes mellitus with other specified complication: Secondary | ICD-10-CM | POA: Diagnosis not present

## 2024-04-02 DIAGNOSIS — Z01810 Encounter for preprocedural cardiovascular examination: Secondary | ICD-10-CM

## 2024-04-02 DIAGNOSIS — D6869 Other thrombophilia: Secondary | ICD-10-CM | POA: Diagnosis not present

## 2024-04-02 DIAGNOSIS — Z7984 Long term (current) use of oral hypoglycemic drugs: Secondary | ICD-10-CM | POA: Diagnosis not present

## 2024-04-02 DIAGNOSIS — I4891 Unspecified atrial fibrillation: Secondary | ICD-10-CM | POA: Diagnosis not present

## 2024-04-02 DIAGNOSIS — J45909 Unspecified asthma, uncomplicated: Secondary | ICD-10-CM | POA: Insufficient documentation

## 2024-04-02 DIAGNOSIS — Z86711 Personal history of pulmonary embolism: Secondary | ICD-10-CM | POA: Diagnosis not present

## 2024-04-02 DIAGNOSIS — Z981 Arthrodesis status: Secondary | ICD-10-CM | POA: Diagnosis not present

## 2024-04-02 DIAGNOSIS — I771 Stricture of artery: Secondary | ICD-10-CM | POA: Diagnosis not present

## 2024-04-02 DIAGNOSIS — Z01818 Encounter for other preprocedural examination: Secondary | ICD-10-CM | POA: Insufficient documentation

## 2024-04-02 HISTORY — DX: Anemia, unspecified: D64.9

## 2024-04-02 LAB — HEMOGLOBIN A1C
Hgb A1c MFr Bld: 6 % — ABNORMAL HIGH (ref 4.8–5.6)
Mean Plasma Glucose: 125.5 mg/dL

## 2024-04-02 LAB — CBC
HCT: 40 % (ref 36.0–46.0)
Hemoglobin: 11.9 g/dL — ABNORMAL LOW (ref 12.0–15.0)
MCH: 26.7 pg (ref 26.0–34.0)
MCHC: 29.8 g/dL — ABNORMAL LOW (ref 30.0–36.0)
MCV: 89.9 fL (ref 80.0–100.0)
Platelets: 258 10*3/uL (ref 150–400)
RBC: 4.45 MIL/uL (ref 3.87–5.11)
RDW: 15.3 % (ref 11.5–15.5)
WBC: 4 10*3/uL (ref 4.0–10.5)
nRBC: 0 % (ref 0.0–0.2)

## 2024-04-02 LAB — SURGICAL PCR SCREEN
MRSA, PCR: NEGATIVE
Staphylococcus aureus: NEGATIVE

## 2024-04-02 LAB — COMPREHENSIVE METABOLIC PANEL WITH GFR
ALT: 15 U/L (ref 0–44)
AST: 15 U/L (ref 15–41)
Albumin: 4.1 g/dL (ref 3.5–5.0)
Alkaline Phosphatase: 54 U/L (ref 38–126)
Anion gap: 9 (ref 5–15)
BUN: 22 mg/dL (ref 8–23)
CO2: 25 mmol/L (ref 22–32)
Calcium: 9.6 mg/dL (ref 8.9–10.3)
Chloride: 106 mmol/L (ref 98–111)
Creatinine, Ser: 0.75 mg/dL (ref 0.44–1.00)
GFR, Estimated: >60 mL/min (ref >60)
Glucose, Bld: 97 mg/dL (ref 70–99)
Potassium: 3.6 mmol/L (ref 3.5–5.1)
Sodium: 140 mmol/L (ref 135–145)
Total Bilirubin: 0.5 mg/dL (ref 0.0–1.2)
Total Protein: 7.1 g/dL (ref 6.5–8.1)

## 2024-04-02 LAB — GLUCOSE, CAPILLARY: Glucose-Capillary: 97 mg/dL (ref 70–99)

## 2024-04-02 NOTE — Care Plan (Signed)
 Ortho Bundle Case Management Note  Patient Details  Name: Chelsea Benson MRN: [id number] Date of Birth: 09-24-1951     spoke with patient. will discharge to home with family to assist. no DME needed. OPPT set up with [facility] [facility] discharge instructions mailed and discussed. questions answered. Patient and MD in agreement with plan. Choice offered.                 DME Arranged:    DME Agency:     HH Arranged:    HH Agency:     Additional Comments: Please contact me with any questions of if this plan should need to change.  Cornelia Dieter,  RN,BSN,MHA,CCM  [facility]  [phone] 04/02/2024, 1:28 PM

## 2024-04-03 ENCOUNTER — Encounter (HOSPITAL_COMMUNITY): Payer: Self-pay

## 2024-04-03 NOTE — Anesthesia Preprocedure Evaluation (Addendum)
 Anesthesia Evaluation  Patient identified by MRN, date of birth, ID band Patient awake    Reviewed: Allergy & Precautions, NPO status , Patient's Chart, lab work & pertinent test results  Airway Mallampati: II  TM Distance: >3 FB Neck ROM: Full    Dental  (+) Dental Advisory Given   Pulmonary sleep apnea and Continuous Positive Airway Pressure Ventilation , COPD,  COPD inhaler   breath sounds clear to auscultation       Cardiovascular hypertension, Pt. on medications (-) CAD + dysrhythmias  Rhythm:Regular Rate:Normal  '23 ECHO: EF 55-60%, normal LVF, Grade 1 DDm normal RVF, mild MR   Neuro/Psych  Headaches  Anxiety Depression Bipolar Disorder      GI/Hepatic Neg liver ROS,GERD  ,,  Endo/Other  diabetes, Oral Hypoglycemic Agents  semaglutide   Renal/GU negative Renal ROS     Musculoskeletal  (+) Arthritis ,    Abdominal   Peds  Hematology Eliquis : last sdose Sunday   Anesthesia Other Findings   Reproductive/Obstetrics                             Anesthesia Physical Anesthesia Plan  ASA: 3  Anesthesia Plan: General   Post-op Pain Management: Tylenol  PO (pre-op)* and Regional block*   Induction: Intravenous  PONV Risk Score and Plan: 3 and Ondansetron  and Dexamethasone   Airway Management Planned: Oral ETT  Additional Equipment: None  Intra-op Plan:   Post-operative Plan: Extubation in OR  Informed Consent: I have reviewed the patients History and Physical, chart, labs and discussed the procedure including the risks, benefits and alternatives for the proposed anesthesia with the patient or authorized representative who has indicated his/her understanding and acceptance.     Dental advisory given  Plan Discussed with: CRNA and Surgeon  Anesthesia Plan Comments: (See PAT note from 4/29 Plan routine monitors, GETA with interscalene block for post op analgesia)         Anesthesia Quick Evaluation

## 2024-04-03 NOTE — Progress Notes (Signed)
 Case: [id number] Date/Time: 04/11/24 0945   Procedure: ARTHROPLASTY, SHOULDER, TOTAL, REVERSE (Right: Shoulder)   Anesthesia type: Choice   Diagnosis: Complete tear of right rotator cuff, unspecified whether traumatic [M75.121]   Pre-op diagnosis: RIGHT SHOULDER ROTATOR CUFF TEAR   Location: [facility] ROOM 07 / [facility]   Surgeons: Sammye Cristal, MD       DISCUSSION: Chelsea Benson is a 73 yo female who presents to PAT prior to surgery above. PMH of HTN, A.fib on Eliquis , hx of PE, TAA, OSA (uses CPAP), asthma, anemia, arthritis, bipolar d/o, anxiety, depression, s/p lumbar fusion (L2-S1)  Pt follows with Cardiology for hx of A fib on Eliquis . Seen on 03/05/24 by Dr. Addie Benson for routine f/u and pre op clearance. Noted to be doing well overall. Reporting intermittent palpitations. BP noted to be controlled (although elevated at PAT visit). Cleared for upcoming surgery:  "Pre-operative cardiovascular examination   Scheduled for a right shoulder replacement, considered a low-risk surgery. No additional studies needed for preoperative clearance. Should hold Eliquis  for 2-3 days prior to surgery. - Instruct to hold Eliquis  for 2-3 days before shoulder replacement surgery   [name] perioperative risk of a major cardiac event is  % according to the Revised Cardiac Risk Index (RCRI).  Therefore, she is at low risk for perioperative complications.   Her functional capacity is fair at 3.97 METs according to the Duke Activity Status Index (DASI). Recommendations: According to ACC/AHA guidelines, no further cardiovascular testing needed.  The patient may proceed to surgery at acceptable risk.   Antiplatelet and/or Anticoagulation Recommendations: Not on antiplatelet agent Eliquis  (Apixaban ) can be held for 2 to 3 days days prior to surgery.  Please resume post op when felt to be safe."  LD Eliquis : 04-07-24  LD Ozempic : 4/27   VS: BP (!) 167/104 Comment: right arm sitting  Pulse 89   Temp 36.5 C  (Oral)   Resp 16   Ht 5\' 3"  (1.6 m)   Wt 78.7 kg   SpO2 99%   BMI 30.73 kg/m   PROVIDERS: Chelsea April, DO   LABS: Labs reviewed: Acceptable for surgery. (all labs ordered are listed, but only abnormal results are displayed)  Labs Reviewed  HEMOGLOBIN A1C - Abnormal; Notable for the following components:      Result Value   Hgb A1c MFr Bld 6.0 (*)    All other components within normal limits  CBC - Abnormal; Notable for the following components:   Hemoglobin 11.9 (*)    MCHC 29.8 (*)    All other components within normal limits  SURGICAL PCR SCREEN  GLUCOSE, CAPILLARY  COMPREHENSIVE METABOLIC PANEL WITH GFR     IMAGES: CXR 04/02/24:  FINDINGS: Stable heart size and mediastinal contours. Aortic tortuosity. Subsegmental scarring in the left mid lung. Mild chronic hyperinflation. No acute airspace disease, pulmonary edema, pleural effusion or pneumothorax. Scoliosis in the thoracolumbar spine. Lumbar fusion hardware partially included.   IMPRESSION: No acute chest findings. EKG:   CV:  Echo 08/19/2022:  IMPRESSIONS     1. Left ventricular ejection fraction, by estimation, is 55 to 60%. The  left ventricle has normal function. The left ventricle has no regional  wall motion abnormalities. Left ventricular diastolic parameters are  consistent with Grade I diastolic  dysfunction (impaired relaxation). The average left ventricular global  longitudinal strain is -21.5 %. The global longitudinal strain is normal.   2. Right ventricular systolic function is normal. The right ventricular  size is normal. There is  normal pulmonary artery systolic pressure. The  estimated right ventricular systolic pressure is 34.1 mmHg.   3. The mitral valve is normal in structure. Mild mitral valve  regurgitation. No evidence of mitral stenosis.   4. The aortic valve is tricuspid. Aortic valve regurgitation is not  visualized. No aortic stenosis is present.   5. There is mild  dilatation of the ascending aorta, measuring 40 mm.   6. The inferior vena cava is normal in size with greater than 50%  respiratory variability, suggesting right atrial pressure of 3 mmHg.   Cardiac monitor 02/05/2021: Mostly sinus rhythm, minimum heart rate 68 bpm, maximum 127 bpm.. Average heart rate 88 bpm. Rare PACs and PVCs (<1%). There is one brief spell that appears to possibly be atrial fibrillation but only 4-1/2 minutes. Rate ranges from 91 to 124 bpm from 10:56 PM to 10:59 PM. Symptoms not noted.  CT coronary 07/16/2020:  IMPRESSION: 1. Coronary calcium  score of 0.   2. Normal coronary origin with right dominance.   3. Significant stair step artifact due to cardiac motion, but the coronary arteries appear to be normal without evidence of CAD.   4. Dilated pulmonary artery suggestive of pulmonary hypertension.   RECOMMENDATIONS: 1. No evidence of CAD (0%). Consider non-atherosclerotic causes of chest pain.    Past Medical History:  Diagnosis Date   Allergic rhinoconjunctivitis    Anemia    Anginal pain (HCC)    Anxiety    Arthritis    Spondylolysis, knee and ankle arthritis pains.   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  He uses a walker   Depression    Diabetes mellitus without complication (HCC)    dx in 2007   Diabetes mellitus, type II (HCC)    Dysrhythmia    A.fib  takes Eliquis    Eczema    Headache(784.0)    sinus related    History of kidney stones    History of nephrolithiasis    Hx of pulmonary embolus 2017   Hyperlipidemia    Hypertension    Insomnia    Memory change    OSA on CPAP    PSG 05/24/2016: AHI 17/hour 02 mean 76%.  HSAT 06/13/17, AHI 11-hour 02 mean 79%   Paroxysmal  atrial fibrillation (HCC) 02/24/2021   CHA2DS2-VASc score 4 (HTN, DM, female, age-12); on Eliquis    Pneumonia    Tremor of both hands     Past Surgical History:  Procedure Laterality Date   ABDOMINAL HYSTERECTOMY     BACK SURGERY     x 4      last in 2023,   2012- lumbar fusion   BREAST SURGERY     L cyst removed - 1972   BUNIONECTOMY Left    Great toe   calf -R- cyst removed     CARPAL TUNNEL RELEASE     both hands    CATARACT EXTRACTION     CORONARY CT ANGIOGRAM  07/17/2019    Coronary calcium  score 0.  Right dominant system.  Difficult to assess because of cardiac motion, but there is no obvious evidence of CAD.  Mild PA dilation.    HAMMER TOE SURGERY  Left    L foot   NASAL SINUS SURGERY     NM MYOVIEW  LTD  12/2008   Normal study.  Normal LV function.  EF 61%.  Apical thinning but no ischemia or infarction.   SHOULDER ARTHROSCOPY Left    R shoulder- RCR   TOE SURGERY     TOTAL KNEE ARTHROPLASTY Right 05/22/2017   Procedure: TOTAL KNEE ARTHROPLASTY;  Surgeon: Wendolyn Hamburger, MD;  Location: [facility];  Service: Orthopedics;  Laterality: Right;   TRANSTHORACIC ECHOCARDIOGRAM  06/2009    Technically difficult.  Moderate concentric LVH with normal LV function.  No regional wall motion normalities.  EF> 55%.  Normal right ventricle.  No valve lesions.  Normal filling pressures.   TRIGGER FINGER RELEASE     L thumb   TUBAL LIGATION      MEDICATIONS:  ACCU-CHEK GUIDE test strip   acetaminophen  (TYLENOL ) 650 MG CR tablet   albuterol  (PROVENTIL ) (2.5 MG/3ML) 0.083% nebulizer solution   albuterol  (VENTOLIN  HFA) 108 (90 Base) MCG/ACT inhaler   apixaban  (ELIQUIS ) 5 MG TABS tablet   ARIPiprazole  (ABILIFY ) 2 MG tablet   azelastine  (ASTELIN ) 0.1 % nasal spray   Bepotastine  Besilate 1.5 % SOLN   Blood Glucose Monitoring Suppl (ACCU-CHEK GUIDE) w/Device KIT   budesonide -formoterol  (SYMBICORT ) 160-4.5 MCG/ACT inhaler   busPIRone  (BUSPAR ) 30 MG tablet   Calcium -Magnesium-Zinc-Vit D3  (CALCIUM -MAGNESIUM-ZINC-D3 PO)   clonazePAM  (KLONOPIN ) 0.5 MG tablet   desonide  (DESOWEN ) 0.05 % ointment   diclofenac Sodium (VOLTAREN ARTHRITIS PAIN) 1 % GEL   diltiazem  (CARDIZEM ) 60 MG tablet   diltiazem  (CARTIA  XT) 300 MG 24 hr capsule   Epinastine HCl 0.05 % ophthalmic solution   EPINEPHrine  0.3 mg/0.3 mL IJ SOAJ injection   famotidine  (PEPCID ) 20 MG tablet   ferrous sulfate 325 (65 FE) MG tablet   fexofenadine  (ALLEGRA ) 180 MG tablet   fluticasone  (FLONASE ) 50 MCG/ACT nasal spray   Fluticasone  Furoate (ARNUITY ELLIPTA ) 100 MCG/ACT AEPB   GEMTESA 75 MG TABS   hydrochlorothiazide  (HYDRODIURIL ) 25 MG tablet   hydrocortisone  2.5 % cream   ipratropium (ATROVENT ) 0.06 % nasal spray   Lancets (ONETOUCH DELICA PLUS LANCET33G) MISC   lidocaine  (LIDODERM ) 5 %   Mepolizumab  (NUCALA ) 100 MG/ML SOAJ   metFORMIN  (GLUCOPHAGE -XR) 500 MG 24 hr tablet   methocarbamol  (ROBAXIN ) 500 MG tablet   mirtazapine  (REMERON ) 30 MG tablet   montelukast  (SINGULAIR ) 10 MG tablet   Nebulizer MISC   Oxcarbazepine  (TRILEPTAL ) 300 MG tablet   pantoprazole  (PROTONIX ) 40 MG tablet   potassium chloride  SA (KLOR-CON  M) 20 MEQ tablet   pravastatin  (PRAVACHOL ) 20 MG tablet   pregabalin  (LYRICA ) 100 MG capsule   Respiratory Therapy Supplies (NEBULIZER) DEVI   Respiratory Therapy Supplies (NEBULIZER/TUBING/MOUTHPIECE) KIT   Semaglutide , 1 MG/DOSE, (OZEMPIC , 1 MG/DOSE,) 2 MG/1.5ML SOPN   Spacer/Aero-Holding Chambers (BREATHE COMFORT CHAMBER/ADULT) DEVI   traMADol (ULTRAM) 50 MG tablet   triamcinolone  (NASACORT ) 55 MCG/ACT AERO nasal inhaler   triamcinolone  ointment (KENALOG ) 0.1 %   zolpidem  (AMBIEN ) 10 MG tablet   No current facility-administered medications for this encounter.   Antoinette Kirschner [facility] Short Stay/Anesthesiology [facility] Phone [phone] 04/03/2024 11:27 AM

## 2024-04-04 DIAGNOSIS — G4733 Obstructive sleep apnea (adult) (pediatric): Secondary | ICD-10-CM | POA: Diagnosis not present

## 2024-04-11 ENCOUNTER — Other Ambulatory Visit (HOSPITAL_COMMUNITY): Payer: Self-pay

## 2024-04-11 ENCOUNTER — Ambulatory Visit (HOSPITAL_COMMUNITY)
Admission: RE | Admit: 2024-04-11 | Discharge: 2024-04-11 | Disposition: A | Discharge status: Home / Self Care | Attending: Orthopedic Surgery | Admitting: Orthopedic Surgery

## 2024-04-11 ENCOUNTER — Other Ambulatory Visit: Payer: Self-pay

## 2024-04-11 ENCOUNTER — Ambulatory Visit (HOSPITAL_COMMUNITY): Payer: Self-pay | Admitting: Medical

## 2024-04-11 ENCOUNTER — Ambulatory Visit (HOSPITAL_COMMUNITY): Admitting: Anesthesiology

## 2024-04-11 ENCOUNTER — Encounter (HOSPITAL_COMMUNITY)
Admission: RE | Disposition: A | Discharge status: Home / Self Care | Payer: Self-pay | Source: Home / Self Care | Attending: Orthopedic Surgery

## 2024-04-11 ENCOUNTER — Encounter (HOSPITAL_COMMUNITY): Payer: Self-pay | Admitting: Orthopedic Surgery

## 2024-04-11 DIAGNOSIS — Z79899 Other long term (current) drug therapy: Secondary | ICD-10-CM | POA: Diagnosis not present

## 2024-04-11 DIAGNOSIS — F418 Other specified anxiety disorders: Secondary | ICD-10-CM | POA: Insufficient documentation

## 2024-04-11 DIAGNOSIS — M19011 Primary osteoarthritis, right shoulder: Secondary | ICD-10-CM | POA: Diagnosis not present

## 2024-04-11 DIAGNOSIS — I10 Essential (primary) hypertension: Secondary | ICD-10-CM | POA: Insufficient documentation

## 2024-04-11 DIAGNOSIS — G4730 Sleep apnea, unspecified: Secondary | ICD-10-CM | POA: Insufficient documentation

## 2024-04-11 DIAGNOSIS — Z7985 Long-term (current) use of injectable non-insulin antidiabetic drugs: Secondary | ICD-10-CM | POA: Insufficient documentation

## 2024-04-11 DIAGNOSIS — Z7984 Long term (current) use of oral hypoglycemic drugs: Secondary | ICD-10-CM | POA: Diagnosis not present

## 2024-04-11 DIAGNOSIS — M75101 Unspecified rotator cuff tear or rupture of right shoulder, not specified as traumatic: Secondary | ICD-10-CM | POA: Diagnosis not present

## 2024-04-11 DIAGNOSIS — E119 Type 2 diabetes mellitus without complications: Secondary | ICD-10-CM | POA: Diagnosis not present

## 2024-04-11 DIAGNOSIS — Z96611 Presence of right artificial shoulder joint: Secondary | ICD-10-CM | POA: Diagnosis not present

## 2024-04-11 DIAGNOSIS — J449 Chronic obstructive pulmonary disease, unspecified: Secondary | ICD-10-CM | POA: Insufficient documentation

## 2024-04-11 DIAGNOSIS — Z7951 Long term (current) use of inhaled steroids: Secondary | ICD-10-CM | POA: Diagnosis not present

## 2024-04-11 DIAGNOSIS — G4733 Obstructive sleep apnea (adult) (pediatric): Secondary | ICD-10-CM

## 2024-04-11 DIAGNOSIS — F319 Bipolar disorder, unspecified: Secondary | ICD-10-CM | POA: Diagnosis not present

## 2024-04-11 DIAGNOSIS — Z7901 Long term (current) use of anticoagulants: Secondary | ICD-10-CM | POA: Diagnosis not present

## 2024-04-11 DIAGNOSIS — Z01818 Encounter for other preprocedural examination: Secondary | ICD-10-CM

## 2024-04-11 DIAGNOSIS — M12811 Other specific arthropathies, not elsewhere classified, right shoulder: Secondary | ICD-10-CM

## 2024-04-11 DIAGNOSIS — M75121 Complete rotator cuff tear or rupture of right shoulder, not specified as traumatic: Secondary | ICD-10-CM | POA: Diagnosis not present

## 2024-04-11 DIAGNOSIS — E1169 Type 2 diabetes mellitus with other specified complication: Secondary | ICD-10-CM

## 2024-04-11 DIAGNOSIS — I480 Paroxysmal atrial fibrillation: Secondary | ICD-10-CM | POA: Diagnosis not present

## 2024-04-11 DIAGNOSIS — G8918 Other acute postprocedural pain: Secondary | ICD-10-CM | POA: Diagnosis not present

## 2024-04-11 HISTORY — PX: REVERSE SHOULDER ARTHROPLASTY: SHX5054

## 2024-04-11 HISTORY — DX: Other specific arthropathies, not elsewhere classified, right shoulder: M12.811

## 2024-04-11 LAB — GLUCOSE, CAPILLARY: Glucose-Capillary: 111 mg/dL — ABNORMAL HIGH (ref 70–99)

## 2024-04-11 SURGERY — ARTHROPLASTY, SHOULDER, TOTAL, REVERSE
Anesthesia: General | Site: Shoulder | Laterality: Right

## 2024-04-11 MED ORDER — BUPIVACAINE LIPOSOME 1.3 % IJ SUSP
INTRAMUSCULAR | Status: DC | PRN
  Administered 2024-04-11: 10 mL via PERINEURAL

## 2024-04-11 MED ORDER — ORAL CARE MOUTH RINSE: 15.0000 mL | Freq: Once | OROMUCOSAL | Status: DC

## 2024-04-11 MED ORDER — ONDANSETRON HCL 4 MG/2ML IJ SOLN
INTRAMUSCULAR | Status: AC
  Filled 2024-04-11: qty 2

## 2024-04-11 MED ORDER — PROPOFOL 10 MG/ML IV BOLUS
INTRAVENOUS | Status: DC | PRN
  Administered 2024-04-11: 100 mg via INTRAVENOUS

## 2024-04-11 MED ORDER — PHENYLEPHRINE HCL-NACL 20-0.9 MG/250ML-% IV SOLN
INTRAVENOUS | Status: DC | PRN
Start: 2024-04-11 — End: 2024-04-11
  Administered 2024-04-11: 50 ug/min via INTRAVENOUS

## 2024-04-11 MED ORDER — OXYCODONE HCL 5 MG/5ML PO SOLN: 5.0000 mg | Freq: Once | ORAL | Status: DC | PRN

## 2024-04-11 MED ORDER — ONDANSETRON HCL 4 MG PO TABS: 4.0000 mg | ORAL_TABLET | Freq: Four times a day (QID) | ORAL | Status: DC | PRN

## 2024-04-11 MED ORDER — LIDOCAINE HCL (PF) 2 % IJ SOLN
INTRAMUSCULAR | Status: AC
  Filled 2024-04-11: qty 5

## 2024-04-11 MED ORDER — OXYCODONE HCL 5 MG PO TABS
5.0000 mg | ORAL_TABLET | ORAL | 0 refills | Status: AC | PRN
  Filled 2024-04-11: qty 30, 5d supply, fill #0

## 2024-04-11 MED ORDER — TRANEXAMIC ACID-NACL 1000-0.7 MG/100ML-% IV SOLN
1000.0000 mg | INTRAVENOUS | Status: AC
  Administered 2024-04-11: 1000 mg via INTRAVENOUS
  Filled 2024-04-11: qty 100

## 2024-04-11 MED ORDER — HYDROMORPHONE HCL 1 MG/ML IJ SOLN: 0.2500 mg | INTRAMUSCULAR | Status: DC | PRN

## 2024-04-11 MED ORDER — METHOCARBAMOL 500 MG PO TABS
500.0000 mg | ORAL_TABLET | Freq: Four times a day (QID) | ORAL | 0 refills | Status: AC | PRN
Start: 2024-04-11 — End: ?
  Filled 2024-04-11 (×2): qty 30, 8d supply, fill #0

## 2024-04-11 MED ORDER — LACTATED RINGERS IV SOLN: INTRAVENOUS | Status: DC

## 2024-04-11 MED ORDER — ROCURONIUM BROMIDE 10 MG/ML (PF) SYRINGE
PREFILLED_SYRINGE | INTRAVENOUS | Status: DC | PRN
  Administered 2024-04-11: 60 mg via INTRAVENOUS

## 2024-04-11 MED ORDER — 0.9 % SODIUM CHLORIDE (POUR BTL) OPTIME
TOPICAL | Status: DC | PRN
  Administered 2024-04-11: 1000 mL

## 2024-04-11 MED ORDER — MIDAZOLAM HCL 2 MG/2ML IJ SOLN: 0.5000 mg | Freq: Once | INTRAMUSCULAR | Status: DC | PRN

## 2024-04-11 MED ORDER — OXYCODONE HCL 5 MG PO TABS: 5.0000 mg | ORAL_TABLET | Freq: Once | ORAL | Status: DC | PRN

## 2024-04-11 MED ORDER — MIDAZOLAM HCL 2 MG/2ML IJ SOLN: 1.0000 mg | INTRAMUSCULAR | Status: DC

## 2024-04-11 MED ORDER — ONDANSETRON HCL 4 MG/2ML IJ SOLN
INTRAMUSCULAR | Status: DC | PRN
  Administered 2024-04-11: 4 mg via INTRAVENOUS

## 2024-04-11 MED ORDER — CEFAZOLIN SODIUM-DEXTROSE 2-4 GM/100ML-% IV SOLN
2.0000 g | INTRAVENOUS | Status: AC
  Administered 2024-04-11: 2 g via INTRAVENOUS
  Filled 2024-04-11: qty 100

## 2024-04-11 MED ORDER — LIDOCAINE HCL (PF) 2 % IJ SOLN
INTRAMUSCULAR | Status: DC | PRN
  Administered 2024-04-11: 10 mg via INTRADERMAL

## 2024-04-11 MED ORDER — SUGAMMADEX SODIUM 200 MG/2ML IV SOLN
INTRAVENOUS | Status: DC | PRN
  Administered 2024-04-11: 200 mg via INTRAVENOUS

## 2024-04-11 MED ORDER — FENTANYL CITRATE PF 50 MCG/ML IJ SOSY
50.0000 ug | PREFILLED_SYRINGE | INTRAMUSCULAR | Status: DC
  Administered 2024-04-11: 100 ug via INTRAVENOUS
  Filled 2024-04-11: qty 2

## 2024-04-11 MED ORDER — CHLORHEXIDINE GLUCONATE 0.12 % MT SOLN
15.0000 mL | Freq: Once | OROMUCOSAL | Status: DC
Start: 2024-04-11 — End: 2024-04-11

## 2024-04-11 MED ORDER — WATER FOR IRRIGATION, STERILE IR SOLN
Status: DC | PRN
  Administered 2024-04-11: 1000 mL

## 2024-04-11 MED ORDER — ONDANSETRON HCL 4 MG/2ML IJ SOLN: 4.0000 mg | Freq: Four times a day (QID) | INTRAMUSCULAR | Status: DC | PRN

## 2024-04-11 MED ORDER — PROPOFOL 10 MG/ML IV BOLUS
INTRAVENOUS | Status: AC
  Filled 2024-04-11: qty 20

## 2024-04-11 MED ORDER — SODIUM CHLORIDE 0.9 % IR SOLN
Status: DC | PRN
  Administered 2024-04-11: 1000 mL

## 2024-04-11 MED ORDER — ACETAMINOPHEN 500 MG PO TABS
1000.0000 mg | ORAL_TABLET | Freq: Once | ORAL | Status: AC
  Administered 2024-04-11: 1000 mg via ORAL
  Filled 2024-04-11: qty 2

## 2024-04-11 MED ORDER — CHLORHEXIDINE GLUCONATE 0.12 % MT SOLN
15.0000 mL | Freq: Once | OROMUCOSAL | Status: AC
  Administered 2024-04-11: 15 mL via OROMUCOSAL

## 2024-04-11 MED ORDER — BUPIVACAINE-EPINEPHRINE (PF) 0.5% -1:200000 IJ SOLN
INTRAMUSCULAR | Status: DC | PRN
Start: 2024-04-11 — End: 2024-04-11
  Administered 2024-04-11: 10 mL via PERINEURAL

## 2024-04-11 MED ORDER — ROCURONIUM BROMIDE 10 MG/ML (PF) SYRINGE
PREFILLED_SYRINGE | INTRAVENOUS | Status: AC
  Filled 2024-04-11: qty 10

## 2024-04-11 MED ORDER — DEXAMETHASONE SODIUM PHOSPHATE 10 MG/ML IJ SOLN
INTRAMUSCULAR | Status: AC
Start: 2024-04-11 — End: ?
  Filled 2024-04-11: qty 1

## 2024-04-11 MED ORDER — INSULIN ASPART 100 UNIT/ML IJ SOLN: 0.0000 [IU] | INTRAMUSCULAR | Status: DC | PRN

## 2024-04-11 MED ORDER — MEPERIDINE HCL 50 MG/ML IJ SOLN: 6.2500 mg | INTRAMUSCULAR | Status: DC | PRN

## 2024-04-11 SURGICAL SUPPLY — 73 items
BAG COUNTER SPONGE SURGICOUNT (BAG) IMPLANT
BAG ZIPLOCK 12X15 (MISCELLANEOUS) ×2 IMPLANT
BASEPLATE P2 COATD GLND 6.5X30 (Shoulder) IMPLANT
BIT DRILL 1.6MX128 (BIT) IMPLANT
BIT DRILL 2.5 DIA 127 CALI (BIT) IMPLANT
BLADE SAW SGTL 73X25 THK (BLADE) ×2 IMPLANT
BOOTIES KNEE HIGH SLOAN (MISCELLANEOUS) ×4 IMPLANT
CLSR STERI-STRIP ANTIMIC 1/2X4 (GAUZE/BANDAGES/DRESSINGS) IMPLANT
COOLER ICEMAN CLASSIC (MISCELLANEOUS) ×2 IMPLANT
COVER BACK TABLE 60X90IN (DRAPES) ×2 IMPLANT
COVER SURGICAL LIGHT HANDLE (MISCELLANEOUS) ×2 IMPLANT
DRAPE INCISE IOBAN 66X45 STRL (DRAPES) ×2 IMPLANT
DRAPE POUCH INSTRU U-SHP 10X18 (DRAPES) ×2 IMPLANT
DRAPE SHEET LG 3/4 BI-LAMINATE (DRAPES) ×2 IMPLANT
DRAPE SURG 17X11 SM STRL (DRAPES) ×2 IMPLANT
DRAPE SURG ORHT 6 SPLT 77X108 (DRAPES) ×4 IMPLANT
DRAPE TOP 10253 STERILE (DRAPES) ×2 IMPLANT
DRAPE U-SHAPE 47X51 STRL (DRAPES) ×2 IMPLANT
DRILL GLEN ALTIVATE 3.5 (DRILL) IMPLANT
DRSG AQUACEL AG ADV 3.5X 6 (GAUZE/BANDAGES/DRESSINGS) ×2 IMPLANT
DRSG AQUACEL AG ADV 3.5X10 (GAUZE/BANDAGES/DRESSINGS) IMPLANT
DURAPREP 26ML APPLICATOR (WOUND CARE) ×4 IMPLANT
ELECT BLADE TIP CTD 4 INCH (ELECTRODE) ×2 IMPLANT
ELECT PENCIL ROCKER SW 15FT (MISCELLANEOUS) ×2 IMPLANT
ELECT REM PT RETURN 15FT ADLT (MISCELLANEOUS) ×2 IMPLANT
FACESHIELD WRAPAROUND (MASK) ×1 IMPLANT
FACESHIELD WRAPAROUND OR TEAM (MASK) ×2 IMPLANT
GLOVE BIO SURGEON STRL SZ7.5 (GLOVE) ×2 IMPLANT
GLOVE BIOGEL PI IND STRL 6.5 (GLOVE) ×2 IMPLANT
GLOVE BIOGEL PI IND STRL 8 (GLOVE) ×2 IMPLANT
GLOVE SURG SS PI 6.5 STRL IVOR (GLOVE) ×2 IMPLANT
GOWN STRL REUS W/ TWL LRG LVL3 (GOWN DISPOSABLE) ×2 IMPLANT
GOWN STRL REUS W/ TWL XL LVL3 (GOWN DISPOSABLE) ×2 IMPLANT
HOOD PEEL AWAY T7 (MISCELLANEOUS) ×6 IMPLANT
INSERT SMALL SOCKET 32MM NEU (Insert) IMPLANT
KIT BASIN OR (CUSTOM PROCEDURE TRAY) ×2 IMPLANT
KIT TURNOVER KIT A (KITS) ×2 IMPLANT
MANIFOLD NEPTUNE II (INSTRUMENTS) ×2 IMPLANT
NDL MA TROC 1/2 CIR (NEEDLE) IMPLANT
NDL TROCAR POINT SZ 2 1/2 (NEEDLE) IMPLANT
NEEDLE MA TROC 1/2 CIR (NEEDLE) ×1 IMPLANT
NEEDLE TROCAR POINT SZ 2 1/2 (NEEDLE) IMPLANT
NS IRRIG 1000ML POUR BTL (IV SOLUTION) ×2 IMPLANT
PACK SHOULDER (CUSTOM PROCEDURE TRAY) ×2 IMPLANT
PAD COLD SHLDR WRAP-ON (PAD) ×2 IMPLANT
RESTRAINT HEAD UNIVERSAL NS (MISCELLANEOUS) ×2 IMPLANT
RETRIEVER SUT HEWSON (MISCELLANEOUS) IMPLANT
SCREW BONE LOCKING RSP 5.0X14 (Screw) ×1 IMPLANT
SCREW BONE LOCKING RSP 5.0X30 (Screw) ×1 IMPLANT
SCREW BONE RSP LOCK 5X14 (Screw) IMPLANT
SCREW BONE RSP LOCK 5X18 (Screw) IMPLANT
SCREW BONE RSP LOCK 5X26 (Screw) IMPLANT
SCREW BONE RSP LOCK 5X30 (Screw) IMPLANT
SCREW BONE RSP LOCKING 18MM LG (Screw) ×1 IMPLANT
SCREW BONE RSP LOCKING 5.0X26 (Screw) ×1 IMPLANT
SCREW RETAIN W/HEAD 4MM OFFSET (Shoulder) IMPLANT
SET HNDPC FAN SPRY TIP SCT (DISPOSABLE) ×2 IMPLANT
SLING ARM FOAM STRAP LRG (SOFTGOODS) IMPLANT
SLING ARM FOAM STRAP MED (SOFTGOODS) IMPLANT
STEM HUMERAL SM SHELL SHOU 10 (Miscellaneous) IMPLANT
STRIP CLOSURE SKIN 1/2X4 (GAUZE/BANDAGES/DRESSINGS) ×2 IMPLANT
SUCTION TUBE FRAZIER 10FR DISP (SUCTIONS) IMPLANT
SUPPORT WRAP ARM LG (MISCELLANEOUS) ×2 IMPLANT
SUT ETHIBOND 2 V 37 (SUTURE) IMPLANT
SUT MNCRL AB 4-0 PS2 18 (SUTURE) ×2 IMPLANT
SUT VIC AB 2-0 CT1 TAPERPNT 27 (SUTURE) ×4 IMPLANT
SUTURE FIBERWR#2 38 REV NDL BL (SUTURE) IMPLANT
TAP SURG THRD DJ 6.5 (ORTHOPEDIC DISPOSABLE SUPPLIES) IMPLANT
TAPE LABRALWHITE 1.5X36 (TAPE) IMPLANT
TAPE SUT LABRALTAP WHT/BLK (SUTURE) IMPLANT
TOWEL OR 17X26 10 PK STRL BLUE (TOWEL DISPOSABLE) ×2 IMPLANT
TUBE SUCTION HIGH CAP CLEAR NV (SUCTIONS) ×2 IMPLANT
WATER STERILE IRR 1000ML POUR (IV SOLUTION) ×2 IMPLANT

## 2024-04-11 NOTE — Anesthesia Postprocedure Evaluation (Signed)
 Anesthesia Post Note  Patient: Chelsea Benson  Procedure(s) Performed: ARTHROPLASTY, SHOULDER, TOTAL, REVERSE (Right: Shoulder)     Patient location during evaluation: PACU Anesthesia Type: General Level of consciousness: awake and alert, patient cooperative and oriented Pain management: pain level controlled Vital Signs Assessment: post-procedure vital signs reviewed and stable Respiratory status: spontaneous breathing, nonlabored ventilation and respiratory function stable Cardiovascular status: blood pressure returned to baseline and stable Postop Assessment: no apparent nausea or vomiting and adequate PO intake Anesthetic complications: no   No notable events documented.  Last Vitals:  Vitals:   04/11/24 1215 04/11/24 1230  BP: 127/85 122/84  Pulse: 63 64  Resp: 12 12  Temp:    SpO2: 97% 90%    Last Pain:  Vitals:   04/11/24 1215  TempSrc:   PainSc: 0-No pain                 Shaira Sova,E. Bird Swetz

## 2024-04-11 NOTE — Anesthesia Procedure Notes (Signed)
 Procedure Name: Intubation Date/Time: 04/11/2024 10:06 AM  Performed by: Micky Albee, CRNAPre-anesthesia Checklist: Patient identified, Emergency Drugs available, Suction available, Patient being monitored and Timeout performed Patient Re-evaluated:Patient Re-evaluated prior to induction Oxygen Delivery Method: Circle system utilized Preoxygenation: Pre-oxygenation with 100% oxygen Induction Type: IV induction Ventilation: Mask ventilation without difficulty Laryngoscope Size: Mac and 4 Grade View: Grade I Tube type: Oral Tube size: 7.0 mm Number of attempts: 1 Airway Equipment and Method: Stylet

## 2024-04-11 NOTE — Op Note (Signed)
 Procedure(s): ARTHROPLASTY, SHOULDER, TOTAL, REVERSE Procedure Note  Chelsea Benson female 73 y.o. 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 female  With endstage right shoulder arthritis with rotator cuff disease. Pain and dysfunction interfered with quality of life and nonoperative treatment with activity modification, NSAIDS and injections failed.     Surgeon: Audrene Blessing   Assistants: Sidra Dredge PA-C Amber was present and scrubbed throughout the procedure and was essential in positioning, retraction, exposure, and closure)  Anesthesia: General endotracheal anesthesia with preoperative interscalene block given by the attending anesthesiologist    Procedure Detail  ARTHROPLASTY, SHOULDER, TOTAL, REVERSE   Estimated Blood Loss:  200 mL         Drains: none  Blood Given: none          Specimens: none        Complications:  * No complications entered in OR log *         Disposition: PACU - hemodynamically stable.         Condition: stable      OPERATIVE FINDINGS:  A DJO Altivate pressfit reverse total shoulder arthroplasty was placed with a  size 10 stem, a 32-4 glenosphere, and a standard-mm poly insert. The base plate  fixation was excellent.  PROCEDURE: The patient was identified in the preoperative holding area  where I personally marked the operative site after verifying site, side,  and procedure with the patient. An interscalene block given by  the attending anesthesiologist in the holding area and the patient was taken back to the operating room where all extremities were  carefully padded in position after general anesthesia was induced. She  was placed in a beach-chair position and the operative upper extremity was  prepped  and draped in a standard sterile fashion. An approximately 10-  cm incision was made from the tip of the coracoid process to the center  point of the humerus at the level of the axilla. Dissection was carried  down through subcutaneous tissues to the level of the cephalic vein  which was taken laterally with the deltoid. The pectoralis major was  retracted medially. The subdeltoid space was developed and the lateral  edge of the conjoined tendon was identified. The undersurface of  conjoined tendon was palpated and the musculocutaneous nerve was not in  the field. Retractor was placed underneath the conjoined and second  retractor was placed lateral into the deltoid. The circumflex humeral  artery and vessels were identified and clamped and coagulated. The  biceps tendon was tenotomized.  The subscapularis was taken down as a peel.  The  joint was then gently externally rotated while the capsule was released  from the humeral neck around to just beyond the 6 o'clock position. At  this point, the joint was dislocated and the humeral head was presented  into the wound. The excessive osteophyte formation was removed with a  large rongeur.  The cutting guide was used to make the appropriate  head cut and the head was saved for potentially bone grafting.  The glenoid was exposed with the arm in an  abducted extended position. The anterior and posterior labrum were  completely excised and the capsule was released circumferentially to  allow for exposure of the glenoid for preparation. The 2.5 mm drill was  placed using  the guide in 5-10 inferior angulation and the tap was then advanced in the same hole. Small and large reamers were then used. The tap was then removed and the Metaglene was then screwed in with excellent purchase.  The peripheral guide was then used to drilled measured and filled peripheral locking screws. The size 32-4 glenosphere was then impacted on the [facility] taper and the central  screw was placed. The humerus was then again exposed and the diaphyseal reamers were used followed by the metaphyseal reamers. The final broach was left in place in the proximal trial was placed. The joint was reduced and with this implant it was felt that soft tissue tensioning was appropriate with excellent stability and excellent range of motion. Therefore, final humeral stem was placed press-fit.  And then the trial polyethylene inserts were tested again and the above implant was felt to be the most appropriate for final insertion. The joint was reduced taken through full range of motion and felt to be stable. Soft tissue tension was appropriate.  The joint was then copiously irrigated with pulse  lavage and the wound was then closed. The subscapularis was repaired with 2 labral tapes previously passed through bone tunnels and around the implant.  Skin was closed with 2-0 Vicryl in a deep dermal layer and 4-0  Monocryl for skin closure. Steri-Strips were applied. Sterile  dressings were then applied as well as a sling. The patient was allowed  to awaken from general anesthesia, transferred to stretcher, and taken  to recovery room in stable condition.   POSTOPERATIVE PLAN: The patient will be observed in the recovery room and if her pain is well-controlled with regional anesthesia and she is hemodynamically stable she can be discharged home today with family.

## 2024-04-11 NOTE — Transfer of Care (Signed)
 Immediate Anesthesia Transfer of Care Note  Patient: Chelsea Benson  Procedure(s) Performed: ARTHROPLASTY, SHOULDER, TOTAL, REVERSE (Right: Shoulder)  Patient Location: PACU  Anesthesia Type:General  Level of Consciousness: sedated  Airway & Oxygen Therapy: Patient Spontanous Breathing and Patient connected to face mask oxygen  Post-op Assessment: Report given to RN and Post -op Vital signs reviewed and stable  Post vital signs: Reviewed and stable  Last Vitals:  Vitals Value Taken Time  BP 132/85 04/11/24 1134  Temp    Pulse 66 04/11/24 1137  Resp 12 04/11/24 1137  SpO2 100 % 04/11/24 1137  Vitals shown include unfiled device data.  Last Pain:  Vitals:   04/11/24 0846  TempSrc:   PainSc: 0-No pain         Complications: No notable events documented.

## 2024-04-11 NOTE — Evaluation (Signed)
 Occupational Therapy Evaluation Patient Details Name: Chelsea Benson MRN: [id number] DOB: 03/30/1951 Today's Date: 04/11/2024   History of Present Illness   73 yo F s/p R TSA.  PMH includes: DM, hx of PE, HTN, PLIF and TKR.     Clinical Impressions PTA pt lives aline, but will be staying with her daughter.  Education completed regarding compensatory strategies for ADL tasks and functional mobility, management of sling, A/P ROM per specified parameters in the order set as indicated below, positioning of operative arm in sitting and supine and edema control, including use of "Iceman" Cold Therapy machine. Caregiver present for education, written handouts provided and reviewed using Teach Back and pt/caregiver verbalized/demonstrated understanding. Due to the below listed deficits, pt requires Mod assistance with ADL tasks and supervision assist with functional mobility. Caregiver will be able to provide necessary level of assistance at discharge. Pt to follow up with MD to progress rehab of the operative shoulder.  No shoulder ROM allowed; elbow/wrist/hand AROM only.  Recommend follow up with MD as prescribed.       If plan is discharge home, recommend the following:   A little help with bathing/dressing/bathroom;Assist for transportation;Assistance with cooking/housework     Functional Status Assessment   Patient has had a recent decline in their functional status and demonstrates the ability to make significant improvements in function in a reasonable and predictable amount of time.     Equipment Recommendations   None recommended by OT     Recommendations for Other Services         Precautions/Restrictions   Precautions Precautions: Shoulder Shoulder Interventions: Shoulder sling/immobilizer;Off for dressing/bathing/exercises Precaution Booklet Issued: Yes (comment) Recall of Precautions/Restrictions: Intact Required Braces or Orthoses: Sling Restrictions Weight  Bearing Restrictions Per Provider Order: Yes RUE Weight Bearing Per Provider Order: Non weight bearing     Mobility Bed Mobility               General bed mobility comments: Up in recliner    Transfers Overall transfer level: Needs assistance   Transfers: Sit to/from Stand Sit to Stand: Supervision                  Balance Overall balance assessment: Mild deficits observed, not formally tested                                         ADL either performed or assessed with clinical judgement   ADL                   Upper Body Dressing : Moderate assistance   Lower Body Dressing: Moderate assistance   Toilet Transfer: Supervision/safety;Contact guard assist                   Vision Patient Visual Report: No change from baseline       Perception Perception: Not tested       Praxis Praxis: Not tested       Pertinent Vitals/Pain Pain Assessment Pain Assessment: Faces Faces Pain Scale: No hurt Pain Location: Block in place Pain Intervention(s): Premedicated before session     Extremity/Trunk Assessment Upper Extremity Assessment Upper Extremity Assessment: Right hand dominant;RUE deficits/detail RUE Deficits / Details: Block in place RUE Sensation: decreased light touch;decreased proprioception RUE Coordination: decreased fine motor;decreased gross motor   Lower Extremity Assessment Lower Extremity Assessment: Overall WFL for tasks assessed  Cervical / Trunk Assessment Cervical / Trunk Assessment: Kyphotic   Communication Communication Communication: No apparent difficulties   Cognition Arousal: Lethargic Behavior During Therapy: WFL for tasks assessed/performed Cognition: No apparent impairments                               Following commands: Intact       Cueing  General Comments   Cueing Techniques: Verbal cues   O2 sats in high 80's but not a great pleth.  RN aware.     Exercises      Shoulder Instructions      Home Living Family/patient expects to be discharged to:: Private residence Living Arrangements: Children Available Help at Discharge: Family;Available 24 hours/day Type of Home: Apartment Home Access: Level entry     Home Layout: One level     Bathroom Shower/Tub: Chief Strategy Officer: Standard Bathroom Accessibility: Yes How Accessible: Accessible via walker Home Equipment: Rolling Walker (2 wheels);BSC/3in1;Cane - single point;Rollator (4 wheels);Shower seat          Prior Functioning/Environment Prior Level of Function : Independent/Modified Independent;Driving                    OT Problem List: Impaired balance (sitting and/or standing);Decreased coordination;Impaired sensation   OT Treatment/Interventions:        OT Goals(Current goals can be found in the care plan section)   Acute Rehab OT Goals Patient Stated Goal: Return home OT Goal Formulation: With patient Time For Goal Achievement: 04/15/24 Potential to Achieve Goals: Good   OT Frequency:       Co-evaluation              AM-PAC OT "6 Clicks" Daily Activity     Outcome Measure Help from another person eating meals?: None Help from another person taking care of personal grooming?: A Little Help from another person toileting, which includes using toliet, bedpan, or urinal?: A Little Help from another person bathing (including washing, rinsing, drying)?: A Lot Help from another person to put on and taking off regular upper body clothing?: A Lot Help from another person to put on and taking off regular lower body clothing?: A Lot 6 Click Score: 16   End of Session Nurse Communication: Mobility status  Activity Tolerance: Patient tolerated treatment well Patient left: in chair;with call bell/phone within reach;with family/visitor present  OT Visit Diagnosis: Unsteadiness on feet (R26.81)                Time: 1610-9604 OT Time Calculation  (min): 24 min Charges:  OT General Charges $OT Visit: 1 Visit OT Evaluation $OT Eval Moderate Complexity: 1 Mod OT Treatments $Self Care/Home Management : 8-22 mins  04/11/2024  RP, OTR/L  Acute Rehabilitation Services  Office:  [phone]   Benjamen Brand 04/11/2024, 2:03 PM

## 2024-04-11 NOTE — Discharge Instructions (Addendum)
Discharge Instructions after Reverse Total Shoulder Arthroplasty ? ? ?A sling has been provided for you. You are to wear this at all times (except for bathing and dressing), until your first post operative visit with Dr. Tamera Punt. Please also wear while sleeping at night. While you bath and dress, let the arm/elbow extend straight down to stretch your elbow. Wiggle your fingers and pump your first while your in the sling to prevent hand swelling. ?Use ice on the shoulder intermittently over the first 48 hours after surgery. Continue to use ice or and ice machine as needed after 48 hours for pain control/swelling.  ?Pain medicine has been prescribed for you.  ?Use your medicine liberally over the first 48 hours, and then you can begin to taper your use. You may take Extra Strength Tylenol or Tylenol only in place of the pain pills. DO NOT take ANY nonsteroidal anti-inflammatory pain medications: Advil, Motrin, Ibuprofen, Aleve, Naproxen or Naprosyn.  ?Resume Eliquis the day after surgery ?Leave your dressing on until your first follow up visit.  You may shower with the dressing.  Hold your arm as if you still have your sling on while you shower. ?Simply allow the water to wash over the site and then pat dry. Make sure your axilla (armpit) is completely dry after showering. ? ? ? ?Please call [phone] during normal business hours or [phone] after hours for any problems. Including the following: ? ?- excessive redness of the incisions ?- drainage for more than 4 days ?- fever of more than 101.5 F ? ?*Please note that pain medications will not be refilled after hours or on weekends. ? ? ? Dental Antibiotics: ? ?In most cases prophylactic antibiotics for Dental procdeures after total joint surgery are not necessary. ? ?Exceptions are as follows: ? ?1. History of prior total joint infection ? ?2. Severely immunocompromised (Organ Transplant, cancer chemotherapy, Rheumatoid biologic ?meds such as [place]) ? ?3.  Poorly controlled diabetes (A1C &gt; 8.0, blood glucose over 200) ? ?If you have one of these conditions, contact your surgeon for an antibiotic prescription, prior to your ?dental procedure.  ?

## 2024-04-11 NOTE — H&P (Signed)
 Chelsea Benson is an 73 y.o. female.   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  Pain interferes with sleep and quality of life.   Past Medical History:  Diagnosis Date   Allergic rhinoconjunctivitis    Anemia    Anginal pain (HCC)    Anxiety    Arthritis    Spondylolysis, knee and ankle arthritis pains.   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  He uses a walker   Depression    Diabetes mellitus, type II (HCC)    Dysrhythmia    A.fib  takes Eliquis    Eczema    Headache(784.0)    sinus related    History of kidney stones    Hx of pulmonary embolus 2017   Hyperlipidemia    Hypertension    Insomnia    Memory change    OSA on CPAP    PSG 05/24/2016: AHI 17/hour 02 mean 76%.  HSAT 06/13/17, AHI 11-hour 02 mean 79%   Paroxysmal atrial fibrillation (HCC) 02/24/2021   CHA2DS2-VASc score 4 (HTN, DM, female, age-56); on Eliquis    Pneumonia    Tremor of both hands     Past Surgical History:  Procedure Laterality Date   ABDOMINAL HYSTERECTOMY     BACK SURGERY     x 4      last in 2023,   2012- lumbar fusion   BREAST SURGERY     L cyst removed - 1972   BUNIONECTOMY Left    Great toe   calf -R- cyst removed     CARPAL TUNNEL RELEASE     both hands    CATARACT EXTRACTION     CORONARY CT ANGIOGRAM  07/17/2019    Coronary calcium  score 0.  Right dominant system.  Difficult to assess because of cardiac motion, but there is no obvious evidence of CAD.  Mild PA dilation.    HAMMER TOE SURGERY Left    L foot   NASAL SINUS SURGERY     NM MYOVIEW  LTD  12/2008   Normal  study.  Normal LV function.  EF 61%.  Apical thinning but no ischemia or infarction.   SHOULDER ARTHROSCOPY Left    R shoulder- RCR   TOE SURGERY     TOTAL KNEE ARTHROPLASTY Right 05/22/2017   Procedure: TOTAL KNEE ARTHROPLASTY;  Surgeon: Wendolyn Hamburger, MD;  Location: [facility] OR;  Service: Orthopedics;  Laterality: Right;   TRANSTHORACIC ECHOCARDIOGRAM  06/2009    Technically difficult.  Moderate concentric LVH with normal LV function.  No regional wall motion normalities.  EF> 55%.  Normal right ventricle.  No valve lesions.  Normal filling pressures.   TRIGGER FINGER RELEASE     L thumb   TUBAL LIGATION      Family History  Problem Relation Age of Onset   Diabetes Sister    Diabetes Brother    Hypertension Brother    Cancer Maternal Grandmother    Heart failure Maternal Grandmother    Hypertension Paternal Grandmother    Asthma Daughter    Cancer Daughter    Depression  Daughter    Hypertension Daughter    Heart failure Maternal Aunt    Pneumonia Mother    Diabetes Mother    Alcoholism Father    Alcohol abuse Father    Depression Daughter    Schizophrenia Grandchild    Allergies Neg Hx    Eczema Neg Hx    Immunodeficiency Neg Hx    Social History:  reports that she has never smoked. She has been exposed to tobacco smoke. She has never used smokeless tobacco. She reports current alcohol use. She reports that she does not use drugs.  Allergies:  Allergies  Allergen Reactions   Ramipril  Other (See Comments)   Cymbalta [Duloxetine Hcl] Other (See Comments)    Caused hair loss   Methylprednisolone  Acetate Hives and Rash    05/29/20 developed hives "all over my back" after MBB; resolved with Benadryl  and "a few days."  Can tolerate Dexamethasone /Decadron . 05/29/20 developed hives "all over my back" after MBB; resolved with Benadryl  and "a few days."  Can tolerate Dexamethasone /Decadron .   Rosuvastatin Calcium  Other (See Comments) and Cough    Rapid heart rate   Iodinated Contrast  Media Itching   Almond Meal (Obsolete) Itching and Swelling    Tongue swells and itches   Almond Oil Itching and Swelling    Itching and swelling of tongue    Carvedilol Cough   Fish Allergy Itching    Halibut fish   Losartan Potassium Cough   Morphine And Codeine Itching    Medications Prior to Admission  Medication Sig Dispense Refill   albuterol  (VENTOLIN  HFA) 108 (90 Base) MCG/ACT inhaler Inhale 2 puffs into the lungs every 4 (four) hours as needed for wheezing or shortness of breath. 54 g 1   apixaban  (ELIQUIS ) 5 MG TABS tablet TAKE ONE TABLET (5 MG TOTAL) BY MOUTH TWICE DAILY 200 tablet 1   ARIPiprazole  (ABILIFY ) 2 MG tablet Take 2 tablets (4 mg total) by mouth daily. (Patient taking differently: Take 8 mg by mouth in the morning.) 180 tablet 0   azelastine  (ASTELIN ) 0.1 % nasal spray Place 2 sprays into both nostrils 2 (two) times daily. 90 mL 1   Bepotastine  Besilate 1.5 % SOLN Place 1 drop into both eyes daily as needed (allergies).     budesonide -formoterol  (SYMBICORT ) 160-4.5 MCG/ACT inhaler Inhale 2 puffs into the lungs in the morning and at bedtime. INHALE 2 INHALATIONS BY MOUTH  INTO THE LUNGS IN THE MORNING  AND AT BEDTIME 30.6 g 0   busPIRone  (BUSPAR ) 30 MG tablet Take 1 tablet (30 mg total) by mouth 2 (two) times daily. 180 tablet 0   Calcium -Magnesium-Zinc-Vit D3 (CALCIUM -MAGNESIUM-ZINC-D3 PO) Take 1 tablet by mouth daily.     clonazePAM  (KLONOPIN ) 0.5 MG tablet Take 1/2-1 tab po TID prn anxiety. (Do not combine with Ambien ) 45 tablet 1   diclofenac Sodium (VOLTAREN ARTHRITIS PAIN) 1 % GEL Apply 2 g topically daily as needed (Knee pain).     diltiazem  (CARDIZEM ) 60 MG tablet TAKE 1 TABLET BY MOUTH EVERY 6 HOURS AS NEEDED FOR PALPITATION EDISODE. UP TO 2 TABLETS PER DAY 60 tablet 1   diltiazem  (CARTIA  XT) 300 MG 24 hr capsule TAKE 1 CAPSULE BY MOUTH DAILY 90 capsule 3   EPINEPHrine  0.3 mg/0.3 mL IJ SOAJ injection Inject 0.3 mg into the muscle as needed for anaphylaxis.  INJECT INTRAMUSCULARLY 1  PEN AS NEEDED FOR ALLERGIC  RESPONSE AS DIRECTED BY MD. SEEK MEDICAL HELP AFTER  USE. 2 each 0  famotidine  (PEPCID ) 20 MG tablet TAKE 1 TABLET BY MOUTH DAILY  AFTER SUPPER 100 tablet 2   ferrous sulfate 325 (65 FE) MG tablet Take 325 mg by mouth daily with breakfast.     fexofenadine  (ALLEGRA ) 180 MG tablet Take 1 tablet (180 mg total) by mouth daily. 90 tablet 1   fluticasone  (FLONASE ) 50 MCG/ACT nasal spray USE 2 SPRAYS IN BOTH NOSTRILS  DAILY 48 g 1   GEMTESA 75 MG TABS Take 75 mg by mouth daily.     hydrochlorothiazide  (HYDRODIURIL ) 25 MG tablet Take 1 tablet (25 mg total) by mouth daily. 30 tablet 0   lidocaine  (LIDODERM ) 5 % Place 1 patch onto the skin daily as needed (pain).     Mepolizumab  (NUCALA ) 100 MG/ML SOAJ Inject 1 mL (100 mg total) into the skin every 28 (twenty-eight) days. 1 mL 11   metFORMIN  (GLUCOPHAGE -XR) 500 MG 24 hr tablet Take 1,000 mg by mouth in the morning and at bedtime.     mirtazapine  (REMERON ) 30 MG tablet TAKE 1 TABLET BY MOUTH AT  BEDTIME 90 tablet 0   montelukast  (SINGULAIR ) 10 MG tablet Take 1 tablet (10 mg total) by mouth at bedtime. 90 tablet 1   Oxcarbazepine  (TRILEPTAL ) 300 MG tablet Take 2 tablets (600 mg total) by mouth at bedtime. 180 tablet 0   pantoprazole  (PROTONIX ) 40 MG tablet Take 1 tablet (40 mg total) by mouth every morning. 90 tablet 0   potassium chloride  SA (KLOR-CON  M) 20 MEQ tablet Take 1 tablet (20 mEq total) by mouth daily. 30 tablet 0   pravastatin  (PRAVACHOL ) 20 MG tablet Take 1 tablet (20 mg total) by mouth at bedtime. (Patient taking differently: Take 40 mg by mouth at bedtime.) 90 tablet 3   pregabalin  (LYRICA ) 100 MG capsule Take 100 mg by mouth 3 (three) times daily.     Semaglutide , 1 MG/DOSE, (OZEMPIC , 1 MG/DOSE,) 2 MG/1.5ML SOPN Take 1 mg by mouth once a week.     traMADol (ULTRAM) 50 MG tablet Take 50 mg by mouth every 8 (eight) hours as needed for moderate pain (pain score 4-6) or severe pain (pain score  7-10).     zolpidem  (AMBIEN ) 10 MG tablet Take 0.5-1 tablets (5-10 mg total) by mouth at bedtime as needed. for sleep (Patient taking differently: Take 10 mg by mouth at bedtime.) 90 tablet 1   ACCU-CHEK GUIDE test strip TEST BID PRN  1   acetaminophen  (TYLENOL ) 650 MG CR tablet Take 1,300 mg by mouth 2 (two) times daily.     albuterol  (PROVENTIL ) (2.5 MG/3ML) 0.083% nebulizer solution Take 3 mLs (2.5 mg total) by nebulization every 4 (four) hours as needed for wheezing or shortness of breath. USE 1 VIAL IN NEBULIZER EVERY 4 HOURS - and as needed 150 mL 0   Blood Glucose Monitoring Suppl (ACCU-CHEK GUIDE) w/Device KIT See admin instructions.  1   desonide  (DESOWEN ) 0.05 % ointment APPLY TO THE AFFECTED AREA(S) TOPICALLY TWICE DAILY AS NEEDED 180 g 0   Epinastine HCl 0.05 % ophthalmic solution Place 1 drop into both eyes daily as needed (itchy/watery eyes). (Patient not taking: Reported on 04/01/2024) 15 mL 0   Fluticasone  Furoate (ARNUITY ELLIPTA ) 100 MCG/ACT AEPB Inhale 1 puff into the lungs daily as needed. (Patient taking differently: Inhale 1 puff into the lungs daily.) 30 each 5   hydrocortisone  2.5 % cream Apply 1 Application topically 2 (two) times daily. (Patient taking differently: Apply 1 Application topically 2 (two) times daily as  needed (Hermorroids).) 30 g 0   ipratropium (ATROVENT ) 0.06 % nasal spray Place 2 sprays into both nostrils 4 (four) times daily as needed for rhinitis. (Patient taking differently: Place 2 sprays into both nostrils 2 (two) times daily.) 15 mL 5   Lancets (ONETOUCH DELICA PLUS LANCET33G) MISC SMARTSIG:1 Topical Daily     methocarbamol  (ROBAXIN ) 500 MG tablet Take 500 mg by mouth 2 (two) times daily.     Nebulizer MISC 1 Device by Does not apply route as needed. 1 each 1   Respiratory Therapy Supplies (NEBULIZER) DEVI Use as directed with nebulizer solution. 1 each 1   Respiratory Therapy Supplies (NEBULIZER/TUBING/MOUTHPIECE) KIT Use as directed with nebulizer  machine 1 kit 12   Spacer/Aero-Holding Chambers (BREATHE COMFORT CHAMBER/ADULT) DEVI 1 each by Does not apply route daily as needed. 1 each 1   triamcinolone  (NASACORT ) 55 MCG/ACT AERO nasal inhaler Place 2 sprays into the nose daily. (Patient not taking: Reported on 04/01/2024) 16.9 mL 5   triamcinolone  ointment (KENALOG ) 0.1 % Apply 1 Application topically 2 (two) times daily. (Patient taking differently: Apply 1 Application topically 2 (two) times daily as needed (Eczema).) 30 g 0    Results for orders placed or performed during the hospital encounter of 04/11/24 (from the past 48 hours)  Glucose, capillary     Status: Abnormal   Collection Time: 04/11/24  8:15 AM  Result Value Ref Range   Glucose-Capillary 111 (H) 70 - 99 mg/dL    Comment: Glucose reference range applies only to samples taken after fasting for at least 8 hours.   No results found.  Review of Systems  All other systems reviewed and are negative.   Blood pressure (!) 151/93, pulse 71, temperature 97.9 F (36.6 C), temperature source Oral, resp. rate 16, height 5\' 3"  (1.6 m), weight 78.7 kg, SpO2 95%. Physical Exam HENT:     Head: Atraumatic.  Eyes:     Extraocular Movements: Extraocular movements intact.  Cardiovascular:     Pulses: Normal pulses.  Pulmonary:     Effort: Pulmonary effort is normal.  Musculoskeletal:     Comments: R shoulder pain with limited ROM> nVID  Neurological:     Mental Status: She is alert.  Psychiatric:        Mood and Affect: Mood normal.      Assessment/Plan Right shoulder rotator cuff tear arthropathy, failed conservative management Plan right reverse total shoulder arthroplasty Risks / benefits of surgery discussed Consent on chart  NPO for OR Preop antibiotics   Audrene Blessing, MD 04/11/2024, 9:26 AM

## 2024-04-11 NOTE — Anesthesia Procedure Notes (Signed)
 Anesthesia Regional Block: Interscalene brachial plexus block   Pre-Anesthetic Checklist: , timeout performed,  Correct Patient, Correct Site, Correct Laterality,  Correct Procedure, Correct Position, site marked,  Risks and benefits discussed,  Surgical consent,  Pre-op evaluation,  At surgeon's request and post-op pain management  Laterality: Right and Upper  Prep: chloraprep       Needles:  Injection technique: Single-shot  Needle Type: Echogenic Needle     Needle Length: 9cm  Needle Gauge: 21     Additional Needles:   Procedures:,,,, ultrasound used (permanent image in chart),,    Narrative:  Start time: 04/11/2024 9:36 AM End time: 04/11/2024 9:42 AM Injection made incrementally with aspirations every 5 mL.  Performed by: Personally  Anesthesiologist: Jonne Netters, MD  Additional Notes: Pt identified in Holding room.  Monitors applied. Working IV access confirmed. Timeout, Sterile prep R clavicle and neck.  #21ga ECHOgenic Arrow block needle to interscalene brachial plexus with US  guidance.  10cc 0.5% Bupivacaine  1:200k epi, exparel  injected incrementally after negative test dose.  Patient asymptomatic, VSS, no heme aspirated, tolerated well.   Fay Hoop, MD

## 2024-04-12 ENCOUNTER — Encounter (HOSPITAL_COMMUNITY): Payer: Self-pay | Admitting: Orthopedic Surgery

## 2024-04-17 DIAGNOSIS — G894 Chronic pain syndrome: Secondary | ICD-10-CM | POA: Diagnosis not present

## 2024-04-17 DIAGNOSIS — M47816 Spondylosis without myelopathy or radiculopathy, lumbar region: Secondary | ICD-10-CM | POA: Diagnosis not present

## 2024-04-17 DIAGNOSIS — M62838 Other muscle spasm: Secondary | ICD-10-CM | POA: Diagnosis not present

## 2024-04-18 DIAGNOSIS — G894 Chronic pain syndrome: Secondary | ICD-10-CM | POA: Diagnosis not present

## 2024-04-18 DIAGNOSIS — M62838 Other muscle spasm: Secondary | ICD-10-CM | POA: Diagnosis not present

## 2024-04-18 DIAGNOSIS — M47816 Spondylosis without myelopathy or radiculopathy, lumbar region: Secondary | ICD-10-CM | POA: Diagnosis not present

## 2024-04-19 ENCOUNTER — Other Ambulatory Visit: Payer: Self-pay | Admitting: *Deleted

## 2024-04-19 MED ORDER — FAMOTIDINE 20 MG PO TABS: ORAL_TABLET | ORAL | 0 refills | Status: DC

## 2024-04-22 ENCOUNTER — Ambulatory Visit: Admitting: Psychiatry

## 2024-04-23 ENCOUNTER — Ambulatory Visit: Admitting: Psychiatry

## 2024-04-23 ENCOUNTER — Other Ambulatory Visit: Payer: Self-pay | Admitting: Allergy

## 2024-04-23 ENCOUNTER — Ambulatory Visit (INDEPENDENT_AMBULATORY_CARE_PROVIDER_SITE_OTHER): Admitting: Psychiatry

## 2024-04-23 DIAGNOSIS — F3181 Bipolar II disorder: Secondary | ICD-10-CM | POA: Diagnosis not present

## 2024-04-23 NOTE — Progress Notes (Signed)
 [facility] Counselor/Therapist Progress Note  Patient ID: Chelsea Benson, MRN: [id number],    Date: 04/23/2024  Time Spent: 48 minutes   Treatment Type: Individual Therapy  Virtual Visit via Telehealth Note: MyChart Telehealth Note: Telephone only as patient unable to do the video today due to equpiment malfunction Connected with patient by a telemedicine/telehealth application, with their informed consent, and verified patient privacy and that I am speaking with the correct person using two identifiers. I discussed the limitations, risks, security and privacy concerns of performing psychotherapy and the availability of in person appointments. I also discussed with the patient that there may be a patient responsible charge related to this service. The patient expressed understanding and agreed to proceed. I discussed the treatment planning with the patient. The patient was provided an opportunity to ask questions and all were answered. The patient agreed with the plan and demonstrated an understanding of the instructions. The patient was advised to call  our office if  symptoms worsen or feel they are in a crisis state and need immediate contact.   Therapist Location: office Patient Location: home    Reported Symptoms:  "bipolar", stressed, anxiety, worrying "about my hand after having surgery", depression decreased    Mental Status Exam:  Appearance:   N/a     Behavior:  Appropriate, Sharing, and Motivated  Motor:  Impacted by recent surgery  Speech/Language:   Clear and Coherent  Affect:  N/A Telehealth  Mood:  anxious and some depression but decreased  Thought process:  goal directed  Thought content:    Rumination  Sensory/Perceptual disturbances:    WNL  Orientation:  oriented to person, place, time/date, situation, day of week, month of year, year, and stated date of Apr 23, 2024  Attention:  Good  Concentration:  Good  Memory:  "Fair" "related some to the drugs they  gave me for surgery"  Fund of knowledge:   Good and Fair  Insight:    Good and Fair  Judgment:   Good  Impulse Control:  Good   Risk Assessment: Danger to Self:  No Self-injurious Behavior: No Danger to Others: No Duty to Warn:no Physical Aggression / Violence:No  Access to Firearms a concern: No  Gang Involvement:No   Subjective:   Patient actively involved in her telehealth session today focusing on her recent surgery, which has "mostly gone well."  Some anxiety and stress going through recent shoulder surgery and talked through her feelings of increased stress today. Has helped that she has backup home-health care. Family also being helpful. Has several supportive people checking in on her. Worrying less.Frustrated with her brother who continues to stay in her house and drinking heavily. Relieved that her surgery is behind her and looking forward to being back at her home and more independent when she has healed more.  Has actually done better after surgery than she had imagined.  Continues to be frustrated with alcoholic brother that lives in her home but feels she cannot have him leave.  She has discussed this at length previously and has found it hard to handle effectively and only family members she reports are not willing for him to live with them.  States that she may eventually find her own place and 1 that would accommodate just a single person living there.  Seem to appreciate feeling validated and her concerns about him but also limitations as to what she can do right now until she heals more and decides what  she plans to do herself.  Continue to encourage patient to stay in contact with extended family and other supportive people, trying to have healthier boundaries with her brother, remaining on her meds as prescribed, maintaining contact with friends that are supportive, making healthy choices for herself, and continue to work with anxiety strategies that helps her manage anxiety and  some of her worrying.   Interventions: Cognitive Behavioral Therapy and Ego-Supportive Long term goal: (measurable) Reduce overall level, frequency, and intensity of the anxiety so that daily functioning is not impaired.  Patient will eventually report a rating of "4" or less" on 1-10 anxiety scale for a 31-month time period where her daily functioning is not impaired.  Short term goal: Increase understanding of beliefs and messages that produce the worry and anxiety. Strategy: Patient will explore and work to stop cognitive messages that feed anxiety and work to replace them with more positive and empowering messages.    Diagnosis:   ICD-10-CM   1. Bipolar II disorder (HCC)  F31.81      Plan:  Patient participating well in session today as she worked further on her anxiety, worrying, and tendency to assume worst-case scenarios.  What is helping her the most right now seems to be that she has done really well coming out of surgery on her shoulder recently.  Has family members helping her out as well as friends.  Feels that surgery went better than she imagined.  Continues to work with her goal-directed behaviors both in sessions and outside of sessions and feels that she is getting stronger and moving in a healthier direction.  Goal review and progress/challenges noted with patient.  Next appointment within 3 to 4 weeks.   Kelleen Patee, LCSW

## 2024-04-25 ENCOUNTER — Ambulatory Visit: Admitting: Psychiatry

## 2024-04-26 DIAGNOSIS — Z96611 Presence of right artificial shoulder joint: Secondary | ICD-10-CM | POA: Diagnosis not present

## 2024-04-26 DIAGNOSIS — M25611 Stiffness of right shoulder, not elsewhere classified: Secondary | ICD-10-CM | POA: Diagnosis not present

## 2024-04-26 DIAGNOSIS — M19011 Primary osteoarthritis, right shoulder: Secondary | ICD-10-CM | POA: Diagnosis not present

## 2024-05-08 ENCOUNTER — Other Ambulatory Visit: Payer: Self-pay | Admitting: Physician Assistant

## 2024-05-08 ENCOUNTER — Other Ambulatory Visit: Payer: Self-pay | Admitting: Allergy

## 2024-05-08 DIAGNOSIS — F5101 Primary insomnia: Secondary | ICD-10-CM

## 2024-05-08 DIAGNOSIS — F3181 Bipolar II disorder: Secondary | ICD-10-CM

## 2024-05-08 DIAGNOSIS — F419 Anxiety disorder, unspecified: Secondary | ICD-10-CM

## 2024-05-09 DIAGNOSIS — Z96611 Presence of right artificial shoulder joint: Secondary | ICD-10-CM | POA: Diagnosis not present

## 2024-05-09 DIAGNOSIS — M25611 Stiffness of right shoulder, not elsewhere classified: Secondary | ICD-10-CM | POA: Diagnosis not present

## 2024-05-13 ENCOUNTER — Ambulatory Visit: Admitting: Psychiatry

## 2024-05-14 DIAGNOSIS — M25611 Stiffness of right shoulder, not elsewhere classified: Secondary | ICD-10-CM | POA: Diagnosis not present

## 2024-05-14 DIAGNOSIS — Z96611 Presence of right artificial shoulder joint: Secondary | ICD-10-CM | POA: Diagnosis not present

## 2024-05-16 ENCOUNTER — Ambulatory Visit: Admitting: Allergy

## 2024-05-16 ENCOUNTER — Encounter: Payer: Self-pay | Admitting: Allergy

## 2024-05-16 ENCOUNTER — Other Ambulatory Visit: Payer: Self-pay

## 2024-05-16 VITALS — BP 120/80 | Temp 97.8°F | Ht 63.0 in | Wt 175.0 lb

## 2024-05-16 DIAGNOSIS — J45909 Unspecified asthma, uncomplicated: Secondary | ICD-10-CM | POA: Diagnosis not present

## 2024-05-16 DIAGNOSIS — H1013 Acute atopic conjunctivitis, bilateral: Secondary | ICD-10-CM

## 2024-05-16 DIAGNOSIS — R053 Chronic cough: Secondary | ICD-10-CM | POA: Diagnosis not present

## 2024-05-16 DIAGNOSIS — T7800XD Anaphylactic reaction due to unspecified food, subsequent encounter: Secondary | ICD-10-CM

## 2024-05-16 DIAGNOSIS — J4540 Moderate persistent asthma, uncomplicated: Secondary | ICD-10-CM

## 2024-05-16 DIAGNOSIS — L2089 Other atopic dermatitis: Secondary | ICD-10-CM

## 2024-05-16 DIAGNOSIS — M25611 Stiffness of right shoulder, not elsewhere classified: Secondary | ICD-10-CM | POA: Diagnosis not present

## 2024-05-16 DIAGNOSIS — J3089 Other allergic rhinitis: Secondary | ICD-10-CM

## 2024-05-16 DIAGNOSIS — K219 Gastro-esophageal reflux disease without esophagitis: Secondary | ICD-10-CM | POA: Diagnosis not present

## 2024-05-16 DIAGNOSIS — Z96611 Presence of right artificial shoulder joint: Secondary | ICD-10-CM | POA: Diagnosis not present

## 2024-05-16 MED ORDER — MONTELUKAST SODIUM 10 MG PO TABS: 10.0000 mg | ORAL_TABLET | Freq: Every day | ORAL | 1 refills | Status: DC

## 2024-05-16 MED ORDER — IPRATROPIUM BROMIDE 0.06 % NA SOLN
2.0000 | Freq: Four times a day (QID) | NASAL | 5 refills | Status: DC | PRN
Start: 2024-05-16 — End: 2024-09-02

## 2024-05-16 MED ORDER — PREDNISONE 20 MG PO TABS
40.0000 mg | ORAL_TABLET | Freq: Every day | ORAL | 0 refills | Status: AC
Start: 2024-05-16 — End: 2024-05-21

## 2024-05-16 MED ORDER — EPINASTINE HCL 0.05 % OP SOLN
1.0000 [drp] | Freq: Every day | OPHTHALMIC | 0 refills | Status: DC | PRN
Start: 2024-05-16 — End: 2024-09-19

## 2024-05-16 MED ORDER — TRIAMCINOLONE ACETONIDE 0.1 % EX OINT
1.0000 | TOPICAL_OINTMENT | Freq: Two times a day (BID) | CUTANEOUS | 0 refills | Status: AC
Start: 2024-05-16 — End: ?

## 2024-05-16 MED ORDER — AZELASTINE HCL 0.1 % NA SOLN: 2.0000 | Freq: Two times a day (BID) | NASAL | 1 refills | Status: DC

## 2024-05-16 MED ORDER — FEXOFENADINE HCL 180 MG PO TABS: 180.0000 mg | ORAL_TABLET | Freq: Every day | ORAL | 1 refills | Status: AC

## 2024-05-16 NOTE — Progress Notes (Signed)
 RE: Chelsea Benson MRN: [id number] DOB: 04/29/1951 Date of Telemedicine Visit: 05/16/2024  Primary care provider: Mordechai April, DO  Chief Complaint: Asthma (Says she is fine. ) and Sinus Problem (Post nasal drip and congestion.)   Telemedicine Follow Up Visit via Telephone: I connected with Chelsea Benson for a follow up on 05/16/24 by telephone and verified that I am speaking with the correct person using two identifiers.   I discussed the limitations, risks, security and privacy concerns of performing an evaluation and management service by telephone and the availability of in person appointments. I also discussed with the patient that there may be a patient responsible charge related to this service. The patient expressed understanding and agreed to proceed.  Patient is at home.  Provider is at the office.  Visit start time: 1110 Visit end time: 1121 Insurance consent/check in by: Peterson Brandt Medical consent and medical assistant/nurse: Cree  History of Present Illness: She is a 73 y.o. female, with increased nasal drainage and cough.  She has history of asthma, allergic rhinoconjunctivitis, food allergy, atopic dermatitis and reflux.  Her previous allergy office visit was on 11/10/23 with Dr. Tempie Fee.   Discussed the use of AI scribe software for clinical note transcription with the patient, who gave verbal consent to proceed.  She has been experiencing nasal drainage and coughing for the past month. The nasal drainage is primarily postnasal, leading to a cough with clear phlegm. There has been no increase in the use of her albuterol  inhaler during this period.  She is currently using azelastine  and ipratropium nasal sprays, which provide minimal relief. The ipratropium spray is used multiple times a day. Additionally, she takes Allegra  and Singulair  once daily.  Her medication regimen includes Nucala  injections administered by her daughter once a month. She has not needed to use her  Arnuity or EpiPen  recently.  She does continue to take Symbicort  2 puffs twice a day as her maintenance asthma medication.  She continues to take Protonix  for reflux control with no increase in reflux symptoms.  Assessment and Plan: Kaprice is a 73 y.o. female with:   Asthma - under good control Continue Symbicort  160- 2 puffs twice a day to prevent cough or wheeze. Continue Montelukast  10mg  once a day to prevent cough or wheeze. Continue Nucala  injections once every 4 weeks. Asthma Action Plan (during respiratory illness or asthma flare): add in Arnuity1 puff once a day for 1-2 weeks and can stop once illness/symptoms have resolved.  Asthma control goals:  Full participation in all desired activities (may need albuterol  before activity) Albuterol  use two time or less a week on average (not counting use with activity) Cough interfering with sleep two time or less a month Oral steroids no more than once a year No hospitalizations  Allergic Rhinitis/Allergic Conjunctivitis Chronic Cough with Post Nasal Drainage - not under control -Continue allergen avoidance measures directed toward pollens, mold, pet, dust mite, cockroach, and mouse urine. - Use nasal saline rinses before nose sprays such as with Neilmed Sinus Rinse.  Use distilled water .   - Use Flonase  2 sprays each nostril daily or 1 spray twice daily. Aim upward and outward. - Use Azelastine  2 sprays each nostril twice daily. Aim upward and outward. - Use Ipratroprium 1-2 sprays up to four times daily as needed for runny nose. Aim upward and outward. - Use Allegra  180mg  daily.  - Use Singulair  10mg  daily.   - For eyes, use Elestat 1 eye drop daily as needed for  itchy, watery eyes.  Also use lubricating eye drops as needed.  - Advised to take predisone 40mg  daily for 5 days to help dampen down sinusitis symptoms driving increased nasal drainage leading to increased cough.  This should help her maintenance sprays be more effective.    Food allergy Continue to avoid almonds.  In case of an allergic reaction, take Benadryl  50 mg every 4 hours, and if life-threatening symptoms occur, inject with EpiPen  0.3 mg.   Atopic dermatitis Continue a twice a day moisturizing routine with Aveeno. Continue triamcinolone  0.1% ointment up to red and itchy areas below your face up to twice a day as needed.   Reflux Continue dietary and lifestyle modifications. Continue Protonix  40 mg once a day as previously prescribed.   Follow-up in 6 months or sooner if needed  Diagnostics: None.  Medication List:  Current Outpatient Medications  Medication Sig Dispense Refill   ACCU-CHEK GUIDE test strip TEST BID PRN  1   albuterol  (PROVENTIL ) (2.5 MG/3ML) 0.083% nebulizer solution Take 3 mLs (2.5 mg total) by nebulization every 4 (four) hours as needed for wheezing or shortness of breath. USE 1 VIAL IN NEBULIZER EVERY 4 HOURS - and as needed 150 mL 0   albuterol  (VENTOLIN  HFA) 108 (90 Base) MCG/ACT inhaler Inhale 2 puffs into the lungs every 4 (four) hours as needed for wheezing or shortness of breath. 54 g 1   apixaban  (ELIQUIS ) 5 MG TABS tablet TAKE ONE TABLET (5 MG TOTAL) BY MOUTH TWICE DAILY 200 tablet 1   ARIPiprazole  (ABILIFY ) 2 MG tablet TAKE 2 TABLETS BY MOUTH DAILY 180 tablet 0   Bepotastine  Besilate 1.5 % SOLN Place 1 drop into both eyes daily as needed (allergies).     Blood Glucose Monitoring Suppl (ACCU-CHEK GUIDE) w/Device KIT See admin instructions.  1   busPIRone  (BUSPAR ) 30 MG tablet Take 1 tablet (30 mg total) by mouth 2 (two) times daily. 180 tablet 0   Calcium -Magnesium-Zinc-Vit D3 (CALCIUM -MAGNESIUM-ZINC-D3 PO) Take 1 tablet by mouth daily.     clonazePAM  (KLONOPIN ) 0.5 MG tablet Take 1/2-1 tab po TID prn anxiety. (Do not combine with Ambien ) 45 tablet 1   desonide  (DESOWEN ) 0.05 % ointment APPLY TO THE AFFECTED AREA(S) TOPICALLY TWICE DAILY AS NEEDED 180 g 0   diclofenac Sodium (VOLTAREN ARTHRITIS PAIN) 1 % GEL Apply 2 g  topically daily as needed (Knee pain).     diltiazem  (CARDIZEM ) 60 MG tablet TAKE 1 TABLET BY MOUTH EVERY 6 HOURS AS NEEDED FOR PALPITATION EDISODE. UP TO 2 TABLETS PER DAY 60 tablet 1   diltiazem  (CARTIA  XT) 300 MG 24 hr capsule TAKE 1 CAPSULE BY MOUTH DAILY 90 capsule 3   EPINEPHrine  0.3 mg/0.3 mL IJ SOAJ injection Inject 0.3 mg into the muscle as needed for anaphylaxis. INJECT INTRAMUSCULARLY 1  PEN AS NEEDED FOR ALLERGIC  RESPONSE AS DIRECTED BY MD. SEEK MEDICAL HELP AFTER  USE. 2 each 0   famotidine  (PEPCID ) 20 MG tablet TAKE 1 TABLET BY MOUTH DAILY 90 tablet 0   ferrous sulfate 325 (65 FE) MG tablet Take 325 mg by mouth daily with breakfast.     fluticasone  (FLONASE ) 50 MCG/ACT nasal spray USE 2 SPRAYS IN BOTH NOSTRILS  DAILY 48 g 1   Fluticasone  Furoate (ARNUITY ELLIPTA ) 100 MCG/ACT AEPB Inhale 1 puff into the lungs daily as needed. 30 each 5   GEMTESA 75 MG TABS Take 75 mg by mouth daily.     hydrochlorothiazide  (HYDRODIURIL ) 25 MG tablet Take  1 tablet (25 mg total) by mouth daily. 30 tablet 0   hydrocortisone  2.5 % cream Apply 1 Application topically 2 (two) times daily. 30 g 0   Lancets (ONETOUCH DELICA PLUS LANCET33G) MISC SMARTSIG:1 Topical Daily     lidocaine  (LIDODERM ) 5 % Place 1 patch onto the skin daily as needed (pain).     Mepolizumab  (NUCALA ) 100 MG/ML SOAJ Inject 1 mL (100 mg total) into the skin every 28 (twenty-eight) days. 1 mL 11   metFORMIN  (GLUCOPHAGE -XR) 500 MG 24 hr tablet Take 1,000 mg by mouth in the morning and at bedtime.     methocarbamol  (ROBAXIN ) 500 MG tablet Take 1 tablet (500 mg total) by mouth every 6 (six) hours as needed for muscle spasms. 30 tablet 0   mirtazapine  (REMERON ) 30 MG tablet TAKE 1 TABLET BY MOUTH AT  BEDTIME 90 tablet 0   Nebulizer MISC 1 Device by Does not apply route as needed. 1 each 1   Oxcarbazepine  (TRILEPTAL ) 300 MG tablet TAKE 2 TABLETS BY MOUTH AT  BEDTIME 180 tablet 0   oxyCODONE  (ROXICODONE ) 5 MG immediate release tablet Take 1  tablet (5 mg total) by mouth every 4 (four) hours as needed for severe pain (pain score 7-10). 30 tablet 0   pantoprazole  (PROTONIX ) 40 MG tablet TAKE 1 TABLET BY MOUTH EVERY  MORNING 90 tablet 3   potassium chloride  SA (KLOR-CON  M) 20 MEQ tablet Take 1 tablet (20 mEq total) by mouth daily. 30 tablet 0   pravastatin  (PRAVACHOL ) 20 MG tablet Take 1 tablet (20 mg total) by mouth at bedtime. 90 tablet 3   predniSONE  (DELTASONE ) 20 MG tablet Take 2 tablets (40 mg total) by mouth daily with breakfast for 5 days. 10 tablet 0   pregabalin  (LYRICA ) 100 MG capsule Take 100 mg by mouth 3 (three) times daily.     Respiratory Therapy Supplies (NEBULIZER) DEVI Use as directed with nebulizer solution. 1 each 1   Respiratory Therapy Supplies (NEBULIZER/TUBING/MOUTHPIECE) KIT Use as directed with nebulizer machine 1 kit 12   Semaglutide , 1 MG/DOSE, (OZEMPIC , 1 MG/DOSE,) 2 MG/1.5ML SOPN Take 1 mg by mouth once a week.     Spacer/Aero-Holding Chambers (BREATHE COMFORT CHAMBER/ADULT) DEVI 1 each by Does not apply route daily as needed. 1 each 1   SYMBICORT  160-4.5 MCG/ACT inhaler INHALE 2 INHALATIONS BY MOUTH  INTO THE LUNGS IN THE MORNING  AND AT BEDTIME 30.6 g 3   triamcinolone  (NASACORT ) 55 MCG/ACT AERO nasal inhaler Place 2 sprays into the nose daily. 16.9 mL 5   zolpidem  (AMBIEN ) 10 MG tablet Take 0.5-1 tablets (5-10 mg total) by mouth at bedtime as needed. for sleep 90 tablet 1   azelastine  (ASTELIN ) 0.1 % nasal spray Place 2 sprays into both nostrils 2 (two) times daily. 90 mL 1   Epinastine HCl 0.05 % ophthalmic solution Place 1 drop into both eyes daily as needed (itchy/watery eyes). 15 mL 0   fexofenadine  (ALLEGRA ) 180 MG tablet Take 1 tablet (180 mg total) by mouth daily. 90 tablet 1   ipratropium (ATROVENT ) 0.06 % nasal spray Place 2 sprays into both nostrils 4 (four) times daily as needed for rhinitis. 15 mL 5   montelukast  (SINGULAIR ) 10 MG tablet Take 1 tablet (10 mg total) by mouth at bedtime. 90  tablet 1   triamcinolone  ointment (KENALOG ) 0.1 % Apply 1 Application topically 2 (two) times daily. 30 g 0   No current facility-administered medications for this visit.   Allergies: Allergies  Allergen Reactions   Ramipril  Other (See Comments)   Cymbalta [Duloxetine Hcl] Other (See Comments)    Caused hair loss   Methylprednisolone  Acetate Hives and Rash    05/29/20 developed hives all over my back after MBB; resolved with Benadryl  and a few days.  Can tolerate Dexamethasone /Decadron . 05/29/20 developed hives all over my back after MBB; resolved with Benadryl  and a few days.  Can tolerate Dexamethasone /Decadron .   Rosuvastatin Calcium  Other (See Comments) and Cough    Rapid heart rate   Iodinated Contrast Media Itching   Almond Meal (Obsolete) Itching and Swelling    Tongue swells and itches   Almond Oil Itching and Swelling    Itching and swelling of tongue    Carvedilol Cough   Fish Allergy Itching    Halibut fish   Losartan Potassium Cough   Morphine And Codeine Itching   I reviewed her past medical history, social history, family history, and environmental history and no significant changes have been reported from previous visit on 11/10/23.  Objective: Physical Exam Not obtained as encounter was done via telephone.   Previous notes and tests were reviewed.  I discussed the assessment and treatment plan with the patient. The patient was provided an opportunity to ask questions and all were answered. The patient agreed with the plan and demonstrated an understanding of the instructions.   The patient was advised to call back or seek an in-person evaluation if the symptoms worsen or if the condition fails to improve as anticipated.  I provided 11 minutes of non-face-to-face time during this encounter.  It was my pleasure to participate in Chelsea Benson's care today. Please feel free to contact me with any questions or concerns.   Sincerely,  Dontrelle Mazon Rama Burkitt, MD

## 2024-05-16 NOTE — Patient Instructions (Addendum)
 Asthma - under good control Continue Symbicort  160- 2 puffs twice a day to prevent cough or wheeze. Continue Montelukast  10mg  once a day to prevent cough or wheeze. Continue Nucala  injections once every 4 weeks. Asthma Action Plan (during respiratory illness or asthma flare): add in Arnuity1 puff once a day for 1-2 weeks and can stop once illness/symptoms have resolved.  Asthma control goals:  Full participation in all desired activities (may need albuterol  before activity) Albuterol  use two time or less a week on average (not counting use with activity) Cough interfering with sleep two time or less a month Oral steroids no more than once a year No hospitalizations  Allergic Rhinitis/Allergic Conjunctivitis Chronic Cough with Post Nasal Drainage - not under control -Continue allergen avoidance measures directed toward pollens, mold, pet, dust mite, cockroach, and mouse urine. - Use nasal saline rinses before nose sprays such as with Neilmed Sinus Rinse.  Use distilled water .   - Use Flonase  2 sprays each nostril daily or 1 spray twice daily. Aim upward and outward. - Use Azelastine  2 sprays each nostril twice daily. Aim upward and outward. - Use Ipratroprium 1-2 sprays up to four times daily as needed for runny nose. Aim upward and outward. - Use Allegra  180mg  daily.  - Use Singulair  10mg  daily.   - For eyes, use Elestat 1 eye drop daily as needed for itchy, watery eyes.  Also use lubricating eye drops as needed.  - Advised to take prednisone  40mg  daily for 5 days to help dampen down sinusitis symptoms driving increased nasal drainage leading to increased cough.  This should help her maintenance sprays be more effective.   Food allergy Continue to avoid almonds.  In case of an allergic reaction, take Benadryl  50 mg every 4 hours, and if life-threatening symptoms occur, inject with EpiPen  0.3 mg.   Atopic dermatitis Continue a twice a day moisturizing routine with Aveeno. Continue  triamcinolone  0.1% ointment up to red and itchy areas below your face up to twice a day as needed.   Reflux Continue dietary and lifestyle modifications. Continue Protonix  40 mg once a day as previously prescribed.   Follow-up in 6 months or sooner if needed

## 2024-05-18 ENCOUNTER — Other Ambulatory Visit: Payer: Self-pay | Admitting: Allergy

## 2024-05-18 DIAGNOSIS — G894 Chronic pain syndrome: Secondary | ICD-10-CM | POA: Diagnosis not present

## 2024-05-18 DIAGNOSIS — M62838 Other muscle spasm: Secondary | ICD-10-CM | POA: Diagnosis not present

## 2024-05-18 DIAGNOSIS — M47816 Spondylosis without myelopathy or radiculopathy, lumbar region: Secondary | ICD-10-CM | POA: Diagnosis not present

## 2024-05-19 DIAGNOSIS — G894 Chronic pain syndrome: Secondary | ICD-10-CM | POA: Diagnosis not present

## 2024-05-19 DIAGNOSIS — M62838 Other muscle spasm: Secondary | ICD-10-CM | POA: Diagnosis not present

## 2024-05-19 DIAGNOSIS — M47816 Spondylosis without myelopathy or radiculopathy, lumbar region: Secondary | ICD-10-CM | POA: Diagnosis not present

## 2024-05-21 DIAGNOSIS — Z96611 Presence of right artificial shoulder joint: Secondary | ICD-10-CM | POA: Diagnosis not present

## 2024-05-21 DIAGNOSIS — M25611 Stiffness of right shoulder, not elsewhere classified: Secondary | ICD-10-CM | POA: Diagnosis not present

## 2024-05-22 ENCOUNTER — Other Ambulatory Visit: Payer: Self-pay

## 2024-05-22 ENCOUNTER — Other Ambulatory Visit (HOSPITAL_COMMUNITY): Payer: Self-pay

## 2024-05-22 MED ORDER — DILTIAZEM HCL 60 MG PO TABS
60.0000 mg | ORAL_TABLET | Freq: Four times a day (QID) | ORAL | 2 refills | Status: DC
  Filled 2024-05-22: qty 180, 90d supply, fill #0
  Filled 2024-07-23: qty 180, 45d supply, fill #0

## 2024-05-23 DIAGNOSIS — M25611 Stiffness of right shoulder, not elsewhere classified: Secondary | ICD-10-CM | POA: Diagnosis not present

## 2024-05-23 DIAGNOSIS — Z96611 Presence of right artificial shoulder joint: Secondary | ICD-10-CM | POA: Diagnosis not present

## 2024-05-27 ENCOUNTER — Telehealth: Payer: Self-pay | Admitting: Allergy

## 2024-05-27 MED ORDER — NUCALA 100 MG/ML ~~LOC~~ SOAJ: 100.0000 mg | SUBCUTANEOUS | 11 refills | Status: AC

## 2024-05-27 NOTE — Telephone Encounter (Signed)
 PT called to ask that Dr. Jeneal send Nucala  via [facility] - pt requested ASAP

## 2024-05-27 NOTE — Telephone Encounter (Signed)
 L/m for patient Rx for Nucala  sent to [facility]

## 2024-05-28 DIAGNOSIS — M25611 Stiffness of right shoulder, not elsewhere classified: Secondary | ICD-10-CM | POA: Diagnosis not present

## 2024-05-28 DIAGNOSIS — Z96611 Presence of right artificial shoulder joint: Secondary | ICD-10-CM | POA: Diagnosis not present

## 2024-05-31 ENCOUNTER — Ambulatory Visit (INDEPENDENT_AMBULATORY_CARE_PROVIDER_SITE_OTHER): Admitting: Psychiatry

## 2024-05-31 DIAGNOSIS — F3181 Bipolar II disorder: Secondary | ICD-10-CM | POA: Diagnosis not present

## 2024-05-31 DIAGNOSIS — G4733 Obstructive sleep apnea (adult) (pediatric): Secondary | ICD-10-CM | POA: Diagnosis not present

## 2024-05-31 DIAGNOSIS — M25611 Stiffness of right shoulder, not elsewhere classified: Secondary | ICD-10-CM | POA: Diagnosis not present

## 2024-05-31 DIAGNOSIS — Z96611 Presence of right artificial shoulder joint: Secondary | ICD-10-CM | POA: Diagnosis not present

## 2024-05-31 NOTE — Progress Notes (Signed)
 [facility] Counselor/Therapist Progress Note  Patient ID: GENORA ARP, MRN: [id number],    Date: 05/31/2024  Time Spent: 50 minutes   Treatment Type: Individual Therapy  Virtual Visit via Telehealth Note: MyChart Video session Connected with patient by a telemedicine/telehealth application, with their informed consent, and verified patient privacy and that I am speaking with the correct person using two identifiers. I discussed the limitations, risks, security and privacy concerns of performing psychotherapy and the availability of in person appointments. I also discussed with the patient that there may be a patient responsible charge related to this service. The patient expressed understanding and agreed to proceed. I discussed the treatment planning with the patient. The patient was provided an opportunity to ask questions and all were answered. The patient agreed with the plan and demonstrated an understanding of the instructions. The patient was advised to call  our office if  symptoms worsen or feel they are in a crisis state and need immediate contact.   Therapist Location: office Patient Location: home   Reported Symptoms:  bipolar, anxiety decreased, depression has improved, less worrying   Mental Status Exam:  Appearance:   Casual     Behavior:  Appropriate and Sharing  Motor:  Sometimes uses cane to walk  Speech/Language:   Clear and Coherent  Affect:  Some anxiety, some bipolar  Mood:  anxious  Thought process:  goal directed  Thought content:    WNL  Sensory/Perceptual disturbances:    WNL  Orientation:  oriented to person, place, time/date, situation, day of week, month of year, year, and stated date of May 31, 2024  Attention:  Good  Concentration:  Good  Memory:  WNL  Fund of knowledge:   Good  Insight:    Good  Judgment:   Good  Impulse Control:  Good   Risk Assessment: Danger to Self:  No Self-injurious Behavior: No Danger to Others:  No Duty to Warn:no Physical Aggression / Violence:No  Access to Firearms a concern: No  Gang Involvement:No   Subjective:   Patient today showing good effort in her telehealth session. Is staying with a daughter currently and recovering well after surgery and will return to her home tomorrow. Alcoholic brother still living in patient's home and patient feels he has improved his attitude some and is more caring.  Needed time in session today to talk through recent stressors of surgery and her ongoing recovery, and seemed to appreciate feeling heard. Does seem to be making progress physically after surgery and also emotionally. Feels she is showing emotional progress in addition to her physical progress. States she is thankful that extended family has been able to help her through the recent time of surgery and recovery. Stated that she has learned more about herself during this time of recuperation and feeling she has personally made progress, giving several examples of her progress physically and emotionally. Likes the aide that helps her with chores and dressing. Less worrying. Not worrying as much, I'm more grounded in some ways. Working to enjoy my moments more. Staying on meds as prescribed, continues working on boundaries with brother, and remaining in touch with supportive friends.   Interventions: Cognitive Behavioral Therapy and Ego-Supportive Long term goal: (measurable) Reduce overall level, frequency, and intensity of the anxiety so that daily functioning is not impaired.  Patient will eventually report a rating of 4 or less on 1-10 anxiety scale for a 59-month time period where her daily functioning is not impaired.  Short term goal: Increase understanding of beliefs and messages that produce the worry and anxiety. Strategy: Patient will explore and work to stop cognitive messages that feed anxiety and work to replace them with more positive and empowering messages.  Diagnosis:    ICD-10-CM   1. Bipolar II disorder (HCC)  F31.81      Plan:   Patient showing good effort in session today concentrating more on improving her management of anxiety, less imagining worst case scenarios and less overthinking, and the tendency to assume worst-case scenarios.  Supportive family and friends.  Some stressors within the family that tend to be episodic and not chronic.  Patient does continue to work with her goal-directed behaviors in everyday life outside of sessions and also when participating actively in the session even though most of her visits have to be virtual (with a video) due to her health.  Goal review and progress/challenges noted with patient.  Next appointment within 3 to 4 weeks.   Barnie Bunde, LCSW

## 2024-06-03 ENCOUNTER — Other Ambulatory Visit: Payer: Self-pay

## 2024-06-03 ENCOUNTER — Telehealth: Payer: Self-pay | Admitting: Physician Assistant

## 2024-06-03 ENCOUNTER — Ambulatory Visit: Admitting: Psychiatry

## 2024-06-03 DIAGNOSIS — F410 Panic disorder [episodic paroxysmal anxiety] without agoraphobia: Secondary | ICD-10-CM

## 2024-06-03 NOTE — Telephone Encounter (Signed)
 Patient lvm at 2:11 stating that she called a couple of months ago regarding Ambien . It isn't working and she sleeps about four hours. She would like to try something else. She also needs refill on Clonazepam  0.5mg . Ph: [phone] Appt 7/24 Pharmacy [facility] [address] [place], [place]

## 2024-06-03 NOTE — Telephone Encounter (Signed)
 Pended clonazepam . Pt prefers [employer] international aid. I find at a pharmacy and then she changes pharmacies. Currently using [facility].

## 2024-06-04 ENCOUNTER — Other Ambulatory Visit (HOSPITAL_COMMUNITY): Payer: Self-pay

## 2024-06-04 MED ORDER — CLONAZEPAM 0.5 MG PO TABS: ORAL_TABLET | ORAL | 1 refills | Status: DC

## 2024-06-04 NOTE — Telephone Encounter (Signed)
 [facility] was able to get Torrent, but said they can't guarantee they can get it. Note on Rx requests Torrent and also noted was other brands ineffective. They recommend she try finding a local pharmacy that may be able to order the medication for her. I had located one probably a year ago. Will research and contact the pharmacy. Patient has been on Ambien  for years and it has worked well for her until manufacturers were changed. She asks about switching medications, but I told her we knew what worked for her and it would be better if we could stay with it, if we could find pharmacy with the preferred manufacturer. Told her I would research and let her know tomorrow.

## 2024-06-05 ENCOUNTER — Other Ambulatory Visit: Payer: Self-pay

## 2024-06-05 DIAGNOSIS — F5101 Primary insomnia: Secondary | ICD-10-CM

## 2024-06-05 MED ORDER — ZOLPIDEM TARTRATE 10 MG PO TABS: 5.0000 mg | ORAL_TABLET | Freq: Every evening | ORAL | 0 refills | Status: DC | PRN

## 2024-06-05 NOTE — Telephone Encounter (Signed)
 [facility] on [address] can get Torrent for zolpidem . Canceled RF at [facility] and pended to [facility]. Patient is aware.

## 2024-06-11 DIAGNOSIS — Z96611 Presence of right artificial shoulder joint: Secondary | ICD-10-CM | POA: Diagnosis not present

## 2024-06-11 DIAGNOSIS — M25611 Stiffness of right shoulder, not elsewhere classified: Secondary | ICD-10-CM | POA: Diagnosis not present

## 2024-06-12 ENCOUNTER — Telehealth: Payer: Self-pay | Admitting: Physician Assistant

## 2024-06-12 ENCOUNTER — Other Ambulatory Visit: Payer: Self-pay

## 2024-06-12 DIAGNOSIS — F419 Anxiety disorder, unspecified: Secondary | ICD-10-CM

## 2024-06-12 DIAGNOSIS — I480 Paroxysmal atrial fibrillation: Secondary | ICD-10-CM

## 2024-06-12 MED ORDER — BUSPIRONE HCL 30 MG PO TABS: 30.0000 mg | ORAL_TABLET | Freq: Two times a day (BID) | ORAL | 0 refills | Status: DC

## 2024-06-12 MED ORDER — APIXABAN 5 MG PO TABS: 5.0000 mg | ORAL_TABLET | Freq: Two times a day (BID) | ORAL | 1 refills | Status: DC

## 2024-06-12 NOTE — Telephone Encounter (Signed)
 Buspar  Rx sent to [facility]

## 2024-06-12 NOTE — Telephone Encounter (Signed)
 Pt LVM @ 2:01p requesting refill of Buspar  to  [facility] [place], [place] - [phone] [address] [place] [address] Phone: [phone]  Fax: [phone]    Next appt 7/24

## 2024-06-12 NOTE — Telephone Encounter (Signed)
 Received faxed Eliquis  refill request from [facility] pharmacy.  Pt last saw Dr Anner 03/05/24, last labs 04/02/24 Creat 0.75, age 73, weight 79.4kg, based on specified criteria pt is on appropriate dosage of Eliquis  5mg  BID for afib.  Will refill rx.

## 2024-06-13 ENCOUNTER — Other Ambulatory Visit: Payer: Self-pay | Admitting: Allergy

## 2024-06-14 DIAGNOSIS — M25611 Stiffness of right shoulder, not elsewhere classified: Secondary | ICD-10-CM | POA: Diagnosis not present

## 2024-06-14 DIAGNOSIS — Z96611 Presence of right artificial shoulder joint: Secondary | ICD-10-CM | POA: Diagnosis not present

## 2024-06-16 ENCOUNTER — Ambulatory Visit: Admission: EM | Admit: 2024-06-16 | Discharge: 2024-06-16 | Disposition: A | Discharge status: Home / Self Care

## 2024-06-16 ENCOUNTER — Encounter: Payer: Self-pay | Admitting: Emergency Medicine

## 2024-06-16 DIAGNOSIS — J4521 Mild intermittent asthma with (acute) exacerbation: Secondary | ICD-10-CM | POA: Diagnosis not present

## 2024-06-16 DIAGNOSIS — J0100 Acute maxillary sinusitis, unspecified: Secondary | ICD-10-CM

## 2024-06-16 DIAGNOSIS — J22 Unspecified acute lower respiratory infection: Secondary | ICD-10-CM | POA: Diagnosis not present

## 2024-06-16 DIAGNOSIS — H6123 Impacted cerumen, bilateral: Secondary | ICD-10-CM

## 2024-06-16 LAB — POC SARS CORONAVIRUS 2 AG -  ED: SARS Coronavirus 2 Ag: NEGATIVE

## 2024-06-16 LAB — POCT INFLUENZA A/B
Influenza A, POC: NEGATIVE
Influenza B, POC: NEGATIVE

## 2024-06-16 MED ORDER — AZITHROMYCIN 250 MG PO TABS: ORAL_TABLET | ORAL | 0 refills | Status: AC

## 2024-06-16 MED ORDER — PREDNISONE 20 MG PO TABS: 40.0000 mg | ORAL_TABLET | Freq: Every day | ORAL | 0 refills | Status: AC

## 2024-06-16 MED ORDER — DEXAMETHASONE SODIUM PHOSPHATE 10 MG/ML IJ SOLN
10.0000 mg | Freq: Once | INTRAMUSCULAR | Status: AC
  Administered 2024-06-16: 10 mg via INTRAMUSCULAR

## 2024-06-16 MED ORDER — IPRATROPIUM-ALBUTEROL 0.5-2.5 (3) MG/3ML IN SOLN
3.0000 mL | Freq: Once | RESPIRATORY_TRACT | Status: AC
  Administered 2024-06-16: 3 mL via RESPIRATORY_TRACT

## 2024-06-16 MED ORDER — BENZONATATE 100 MG PO CAPS: 100.0000 mg | ORAL_CAPSULE | Freq: Three times a day (TID) | ORAL | 0 refills | Status: AC | PRN

## 2024-06-16 NOTE — Discharge Instructions (Addendum)
 Flu A, flu B and COVID testing done today.  These tests are negative.  Symptoms and physical exam findings are most consistent with an asthma exacerbation secondary to a sinus infection with possible lower respiratory infection.  Chest x-ray was deferred today as it would not change the course of our treatment (x-ray was not available at our site today but no acute indication for urgent chest imaging).  Also on physical exam, the left ear canal was occluded with earwax and the right was partially occluded.  We have treated all conditions with the following: DuoNeb breathing treatment done today. Decadron  injection given today. This is a steroid to help with inflammation. Azithromycin  250mg  Take 2 tablets today and the 1 tablet daily for 4 more days. Prednisone  40 mg (2 tablets) once daily for 5 days. Take this in the morning.  This is a steroid to help with inflammation and pain. Benzonatate  (tessalon ) 100 mg every 8 hours as needed for cough.   If breathing symptoms do not improve in the next 3 to 4 days or if symptoms worsen then return to urgent care as this may indicate you need a chest x-ray. Ear irrigation done today.  Tympanic membranes (eardrums) are clear.  Can use over-the-counter Debrox to help prevent earwax buildup.  Do not use Q-tips to clean out the ears. Rest when needed.  Make sure to stay hydrated and drink plenty of water . Return to urgent care or PCP if symptoms worsen or fail to resolve.

## 2024-06-16 NOTE — ED Triage Notes (Addendum)
 Pt reports c/o URI sxs since last Monday. Pt denies emesis but does c/o diarrhea yesterday. Pt denies fever and reports she was keeping track of it at home.

## 2024-06-16 NOTE — ED Provider Notes (Signed)
 [name]    CSN: [id number] Arrival date & time: 06/16/24  0804      History   Chief Complaint Chief Complaint  Patient presents with   Facial Pain   Cough   Nasal Congestion    HPI Chelsea Benson is a 73 y.o. female.   73 year old female who presents urgent care with complaints of sinus congestion, pressure and swelling in the face, cough, chest tightness, poor appetite and generally feeling unwell.  She reports that all of her symptoms started on Monday.  They have gotten worse.  She does have a history of asthma and has had to use her albuterol  nebulizing treatments as well as her inhaler much more frequently.  She denies any fevers, nausea or vomiting.  She does report that the cough can be severe at times.  Sometimes the cough is more of a clearing of the throat but many times the cough is productive with light yellow mucus.  She reports the cough tends to be the same day and night.  She has been using her normal medications including Flonase , Allegra  and Atrovent .  She denies any sick contacts that she knows of.      Cough Associated symptoms: no chest pain, no chills, no ear pain, no fever, no rash, no shortness of breath and no sore throat     Past Medical History:  Diagnosis Date   Allergic rhinoconjunctivitis    Anemia    Anginal pain (HCC)    Anxiety    Arthritis    Spondylolysis, knee and ankle arthritis pains.   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  He uses a walker   Depression    Diabetes mellitus, type II (HCC)    Dysrhythmia    A.fib  takes Eliquis    Eczema    Headache(784.0)    sinus related    History of kidney stones    Hx of pulmonary embolus 2017    Hyperlipidemia    Hypertension    Insomnia    Memory change    OSA on CPAP    PSG 05/24/2016: AHI 17/hour 02 mean 76%.  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   Not well controlled severe persistent asthma 08/09/2022   HNP (herniated nucleus pulposus), lumbar 01/03/2022   Paroxysmal atrial fibrillation (HCC); CHA2DS2-VASc score 4 HTN, DM, female, age)-on Eliquis  02/24/2021   Leg cramps - Left Calf 12/31/2020   Rapid palpitations 06/17/2020   Acute pharyngitis 01/13/2020   Cough variant asthma vs UACS 06/06/2019   Moderate persistent asthma with acute exacerbation 03/13/2019   Allergic rhinitis due to allergen 03/13/2019   Allergic conjunctivitis of both eyes 03/13/2019   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   Anxiety hyperventilation 10/03/2018  Bipolar II disorder (HCC) 09/18/2018   Cardiac risk counseling 01/07/2018   Influenza 10/19/2017   Cough 10/19/2017   Allergy to almonds 10/19/2017   Flexural atopic dermatitis 10/19/2017   Allergic rhinoconjunctivitis 10/19/2017   Moderate persistent asthma, uncomplicated 10/19/2017   S/P arthroscopy of right knee 06/04/2017   Acute blood loss as cause of postoperative anemia 06/04/2017   Mood disorder (HCC)    Allergy history unknown    Anxiety 05/24/2017   Diabetes mellitus (HCC) 05/24/2017   Hyperlipidemia associated with type 2 diabetes mellitus (HCC) 05/24/2017   Insomnia 05/24/2017   Primary osteoarthritis of right knee 05/22/2017   Osteoarthritis of right knee 05/18/2017   Allergic  rhinitis due to pollen 10/06/2014   Arthritis 10/06/2014   Asthma 10/06/2014   Cubital tunnel syndrome, right 10/06/2014   Headache 10/06/2014   Essential hypertension 10/06/2014   Depression 08/07/2014   Postlaminectomy syndrome, lumbar region 09/06/2013   DDD (degenerative disc disease), lumbosacral 05/11/2013   Spinal stenosis of lumbar region 05/11/2013    Past Surgical History:  Procedure Laterality Date   ABDOMINAL HYSTERECTOMY     BACK SURGERY     x 4      last in 2023,   2012- lumbar fusion   BREAST SURGERY     L cyst removed - 1972   BUNIONECTOMY Left    Great toe   calf -R- cyst removed     CARPAL TUNNEL RELEASE     both hands    CATARACT EXTRACTION     CORONARY CT ANGIOGRAM  07/17/2019    Coronary calcium  score 0.  Right dominant system.  Difficult to assess because of cardiac motion, but there is no obvious evidence of CAD.  Mild PA dilation.    HAMMER TOE SURGERY Left    L foot   NASAL SINUS SURGERY     NM MYOVIEW  LTD  12/2008   Normal study.  Normal LV function.  EF 61%.  Apical thinning but no ischemia or infarction.   REVERSE SHOULDER ARTHROPLASTY Right 04/11/2024   Procedure: ARTHROPLASTY, SHOULDER, TOTAL, REVERSE;  Surgeon: Dozier Soulier, MD;  Location: [facility];  Service: Orthopedics;  Laterality: Right;   SHOULDER ARTHROSCOPY Left    R shoulder- RCR   TOE SURGERY     TOTAL KNEE ARTHROPLASTY Right 05/22/2017   Procedure: TOTAL KNEE ARTHROPLASTY;  Surgeon: Liam Lerner, MD;  Location: [facility];  Service: Orthopedics;  Laterality: Right;   TRANSTHORACIC ECHOCARDIOGRAM  06/2009    Technically difficult.  Moderate concentric LVH with normal LV function.  No regional wall motion normalities.  EF> 55%.  Normal right ventricle.  No valve lesions.  Normal filling pressures.   TRIGGER FINGER RELEASE     L thumb   TUBAL LIGATION      OB History   No obstetric history on file.      Home Medications    Prior to Admission medications   Medication Sig Start  Date End Date Taking? Authorizing Provider  azithromycin  (ZITHROMAX ) 250 MG tablet Take first 2 tablets together, then 1 every day until finished. 06/16/24  Yes Tenille Morrill A, PA-C  benzonatate  (TESSALON ) 100 MG capsule Take 1 capsule (100 mg total) by mouth every 8 (eight) hours as needed for cough. 06/16/24  Yes Kolston Lacount A, PA-C  predniSONE  (DELTASONE ) 20 MG tablet Take 2 tablets (40 mg total) by mouth daily with breakfast for 5 days. 06/16/24 06/21/24 Yes Kacey Vicuna A, PA-C  ACCU-CHEK GUIDE test strip TEST BID  PRN 09/17/18   [provider]  albuterol  (PROVENTIL ) (2.5 MG/3ML) 0.083% nebulizer solution Take 3 mLs (2.5 mg total) by nebulization every 4 (four) hours as needed for wheezing or shortness of breath. USE 1 VIAL IN NEBULIZER EVERY 4 HOURS - and as needed 02/21/24   Jeneal Danita Macintosh, MD  albuterol  (VENTOLIN  HFA) 108 [phone] Base) MCG/ACT inhaler Inhale 2 puffs into the lungs every 4 (four) hours as needed for wheezing or shortness of breath. 02/22/24   Padgett, Danita Macintosh, MD  apixaban  (ELIQUIS ) 5 MG TABS tablet Take 1 tablet (5 mg total) by mouth 2 (two) times daily. 06/12/24   Anner Alm ORN, MD  ARIPiprazole  (ABILIFY ) 2 MG tablet TAKE 2 TABLETS BY MOUTH DAILY 05/10/24   Rhys Boyer T, PA-C  azelastine  (ASTELIN ) 0.1 % nasal spray Place 2 sprays into both nostrils 2 (two) times daily. 05/16/24   Jeneal Danita Macintosh, MD  Bepotastine  Besilate 1.5 % SOLN Place 1 drop into both eyes daily as needed (allergies).    [provider]  Blood Glucose Monitoring Suppl (ACCU-CHEK GUIDE) w/Device KIT See admin instructions. 09/18/18   [provider]  busPIRone  (BUSPAR ) 30 MG tablet Take 1 tablet (30 mg total) by mouth 2 (two) times daily. 06/12/24 09/10/24  Rhys Boyer T, PA-C  Calcium -Magnesium-Zinc-Vit D3 (CALCIUM -MAGNESIUM-ZINC-D3 PO) Take 1 tablet by mouth daily.    [provider]  clonazePAM  (KLONOPIN ) 0.5 MG tablet Take 1/2-1 tab po  TID prn anxiety. (Do not combine with Ambien ) 06/04/24   Rhys Boyer T, PA-C  desonide  (DESOWEN ) 0.05 % ointment APPLY TO THE AFFECTED AREA(S) TOPICALLY TWICE DAILY AS NEEDED 02/13/24   Jeneal Danita Macintosh, MD  diclofenac Sodium (VOLTAREN ARTHRITIS PAIN) 1 % GEL Apply 2 g topically daily as needed (Knee pain).    [provider]  diltiazem  (CARDIZEM ) 60 MG tablet Take 1 tablet (60 mg total) by mouth every 6 (six) hours as needed for palpitation episode. MAX up to 2 tablets per day. 05/22/24   Anner Alm ORN, MD  diltiazem  (CARTIA  XT) 300 MG 24 hr capsule TAKE 1 CAPSULE BY MOUTH DAILY 03/07/24   Anner Alm ORN, MD  Epinastine HCl 0.05 % ophthalmic solution Place 1 drop into both eyes daily as needed (itchy/watery eyes). 05/16/24   Jeneal Danita Macintosh, MD  EPINEPHrine  0.3 mg/0.3 mL IJ SOAJ injection Inject 0.3 mg into the muscle as needed for anaphylaxis. INJECT INTRAMUSCULARLY 1  PEN AS NEEDED FOR ALLERGIC  RESPONSE AS DIRECTED BY MD. GREEN MEDICAL HELP AFTER  USE. 02/21/24   Jeneal Danita Macintosh, MD  famotidine  (PEPCID ) 20 MG tablet TAKE 1 TABLET BY MOUTH DAILY 04/19/24   Jeneal Danita Macintosh, MD  ferrous sulfate 325 (65 FE) MG tablet Take 325 mg by mouth daily with breakfast.    [provider]  fexofenadine  (ALLEGRA ) 180 MG tablet Take 1 tablet (180 mg total) by mouth daily. 05/16/24   Jeneal Danita Macintosh, MD  fluticasone  (FLONASE ) 50 MCG/ACT nasal spray USE 2 SPRAYS IN BOTH NOSTRILS  DAILY 02/21/24   Jeneal Danita Macintosh, MD  Fluticasone  Furoate (ARNUITY ELLIPTA ) 100 MCG/ACT AEPB Inhale 1 puff into the lungs daily as needed. 11/10/23   Jeneal Danita Macintosh, MD  GEMTESA 75 MG TABS Take 75 mg by mouth daily. 10/23/23   [provider]  hydrochlorothiazide  (HYDRODIURIL ) 25 MG tablet Take 1 tablet (25 mg total) by mouth daily. 02/20/24   Anner Alm ORN, MD  hydrocortisone  2.5 % cream Apply 1 Application topically 2 (  two) times daily. 02/21/24    Jeneal Danita Macintosh, MD  ipratropium (ATROVENT ) 0.06 % nasal spray Place 2 sprays into both nostrils 4 (four) times daily as needed for rhinitis. 05/16/24   Jeneal Danita Macintosh, MD  Lancets [facility] DELICA PLUS [place]) MISC SMARTSIG:1 Topical Daily 03/20/23   [provider]  lidocaine  (LIDODERM ) 5 % Place 1 patch onto the skin daily as needed (pain).    [provider]  Mepolizumab  (NUCALA ) 100 MG/ML SOAJ Inject 1 mL (100 mg total) into the skin every 28 (twenty-eight) days. 05/27/24   Jeneal Danita Macintosh, MD  metFORMIN  (GLUCOPHAGE -XR) 500 MG 24 hr tablet Take 1,000 mg by mouth in the morning and at bedtime. 07/02/19   [provider]  methocarbamol  (ROBAXIN ) 500 MG tablet Take 1 tablet (500 mg total) by mouth every 6 (six) hours as needed for muscle spasms. 04/11/24   [place], Hospital doctor, PA-C  mirtazapine  (REMERON ) 30 MG tablet TAKE 1 TABLET BY MOUTH AT  BEDTIME 05/10/24   Hurst, Verneita T, PA-C  montelukast  (SINGULAIR ) 10 MG tablet TAKE 1 TABLET BY MOUTH AT  BEDTIME 05/20/24   Jeneal Danita Macintosh, MD  Nebulizer MISC 1 Device by Does not apply route as needed. 02/21/24   Jeneal Danita Macintosh, MD  Oxcarbazepine  (TRILEPTAL ) 300 MG tablet TAKE 2 TABLETS BY MOUTH AT  BEDTIME 05/10/24   Hurst, Verneita T, PA-C  oxyCODONE  (ROXICODONE ) 5 MG immediate release tablet Take 1 tablet (5 mg total) by mouth every 4 (four) hours as needed for severe pain (pain score 7-10). 04/11/24   [place], Hospital doctor, PA-C  pantoprazole  (PROTONIX ) 40 MG tablet TAKE 1 TABLET BY MOUTH EVERY  MORNING 04/23/24   Padgett, Danita Macintosh, MD  potassium chloride  SA (KLOR-CON  M) 20 MEQ tablet Take 1 tablet (20 mEq total) by mouth daily. 02/20/24   Anner Alm ORN, MD  pravastatin  (PRAVACHOL ) 20 MG tablet Take 1 tablet (20 mg total) by mouth at bedtime. 09/29/21   Anner Alm ORN, MD  pregabalin  (LYRICA ) 100 MG capsule Take 100 mg by mouth 3 (three) times daily.    [provider]   Respiratory Therapy Supplies (NEBULIZER) DEVI Use as directed with nebulizer solution. 06/01/18   Iva Marty Saltness, MD  Respiratory Therapy Supplies (NEBULIZER/TUBING/MOUTHPIECE) KIT Use as directed with nebulizer machine 04/17/20   Padgett, Danita Macintosh, MD  Semaglutide , 1 MG/DOSE, (OZEMPIC , 1 MG/DOSE,) 2 MG/1.5ML SOPN Take 1 mg by mouth once a week.    [provider]  Spacer/Aero-Holding Chambers (BREATHE COMFORT CHAMBER/ADULT) DEVI 1 each by Does not apply route daily as needed. 02/21/24   Jeneal Danita Macintosh, MD  SYMBICORT  160-4.5 MCG/ACT inhaler INHALE 2 INHALATIONS BY MOUTH  INTO THE LUNGS IN THE MORNING  AND AT BEDTIME 05/10/24   Jeneal Danita Macintosh, MD  traMADol (ULTRAM) 50 MG tablet 1 tablet as needed Oral twice a day; Duration: 12 days    [provider]  triamcinolone  (NASACORT ) 55 MCG/ACT AERO nasal inhaler Place 2 sprays into the nose daily. 05/25/23   Jeneal Danita Macintosh, MD  triamcinolone  ointment (KENALOG ) 0.1 % Apply 1 Application topically 2 (two) times daily. 05/16/24   Jeneal Danita Macintosh, MD  zolpidem  (AMBIEN ) 10 MG tablet Take 0.5-1 tablets (5-10 mg total) by mouth at bedtime as needed for sleep. 06/05/24   Rhys Verneita DASEN, PA-C    Family History Family History  Problem Relation Age of Onset   Diabetes Sister    Diabetes Brother    Hypertension Brother  Cancer Maternal Grandmother    Heart failure Maternal Grandmother    Hypertension Paternal Grandmother    Asthma Daughter    Cancer Daughter    Depression Daughter    Hypertension Daughter    Heart failure Maternal Aunt    Pneumonia Mother    Diabetes Mother    Alcoholism Father    Alcohol abuse Father    Depression Daughter    Schizophrenia Grandchild    Allergies Neg Hx    Eczema Neg Hx    Immunodeficiency Neg Hx     Social History Social History   Tobacco Use   Smoking status: Never    Passive exposure: Yes   Smokeless tobacco: Never   Tobacco comments:     grandson smokes, but in his car  Vaping Use   Vaping status: Never Used  Substance Use Topics   Alcohol use: Yes    Comment: rare use   Drug use: No     Allergies   Ramipril , Cymbalta [duloxetine hcl], Methylprednisolone  acetate, Rosuvastatin calcium , Iodinated contrast media, Almond meal (obsolete), Almond oil, Carvedilol, Fish allergy, Losartan potassium, Methylprednisolone , Mirabegron, and Morphine and codeine   Review of Systems Review of Systems  Constitutional:  Positive for appetite change and fatigue. Negative for chills and fever.  HENT:  Positive for congestion, sinus pressure and sinus pain. Negative for ear pain and sore throat.   Eyes:  Negative for pain and visual disturbance.  Respiratory:  Positive for cough and chest tightness. Negative for shortness of breath.   Cardiovascular:  Negative for chest pain and palpitations.  Gastrointestinal:  Negative for abdominal pain, nausea and vomiting.  Genitourinary:  Negative for dysuria and hematuria.  Musculoskeletal:  Negative for arthralgias and back pain.  Skin:  Negative for color change and rash.  Neurological:  Negative for seizures and syncope.  All other systems reviewed and are negative.    Physical Exam Triage Vital Signs ED Triage Vitals  Encounter Vitals Group     BP 06/16/24 0826 (!) 144/90     Girls Systolic BP Percentile --      Girls Diastolic BP Percentile --      Boys Systolic BP Percentile --      Boys Diastolic BP Percentile --      Pulse Rate 06/16/24 0826 100     Resp 06/16/24 0826 18     Temp 06/16/24 0826 98.4 F (36.9 C)     Temp Source 06/16/24 0826 Oral     SpO2 06/16/24 0826 96 %     Weight 06/16/24 0826 175 lb 0.7 oz (79.4 kg)     Height --      Head Circumference --      Peak Flow --      Pain Score 06/16/24 0823 5     Pain Loc --      Pain Education --      Exclude from Growth Chart --    No data found.  Updated Vital Signs BP (!) 144/90 (BP Location: Left Arm)    Pulse 100   Temp 98.4 F (36.9 C) (Oral)   Resp 18   Wt 175 lb 0.7 oz (79.4 kg)   SpO2 96%   BMI 31.01 kg/m   Visual Acuity Right Eye Distance:   Left Eye Distance:   Bilateral Distance:    Right Eye Near:   Left Eye Near:    Bilateral Near:     Physical Exam Vitals and nursing note reviewed.  Constitutional:  General: She is not in acute distress.    Appearance: She is well-developed.  HENT:     Head: Normocephalic and atraumatic.     Right Ear: There is impacted cerumen (Partially).     Left Ear: There is impacted cerumen.     Nose: Nasal tenderness, mucosal edema, congestion and rhinorrhea present.     Right Sinus: Maxillary sinus tenderness present.     Left Sinus: Maxillary sinus tenderness present.     Mouth/Throat:     Mouth: Mucous membranes are moist.  Eyes:     Conjunctiva/sclera: Conjunctivae normal.  Cardiovascular:     Rate and Rhythm: Normal rate and regular rhythm.     Heart sounds: No murmur heard. Pulmonary:     Effort: Pulmonary effort is normal. No tachypnea or respiratory distress.     Breath sounds: Examination of the right-lower field reveals decreased breath sounds, wheezing and rhonchi. Examination of the left-lower field reveals decreased breath sounds, wheezing and rhonchi. Decreased breath sounds, wheezing and rhonchi present.  Abdominal:     Palpations: Abdomen is soft.     Tenderness: There is no abdominal tenderness.  Musculoskeletal:        General: No swelling.     Cervical back: Neck supple.  Skin:    General: Skin is warm and dry.     Capillary Refill: Capillary refill takes less than 2 seconds.  Neurological:     Mental Status: She is alert.  Psychiatric:        Mood and Affect: Mood normal.      UC Treatments / Results  Labs (all labs ordered are listed, but only abnormal results are displayed) Labs Reviewed  POCT INFLUENZA A/B - Normal  POC SARS CORONAVIRUS 2 AG -  ED - Normal    EKG   Radiology No results  found.  Procedures Procedures (including critical care time)  Medications Ordered in UC Medications  ipratropium-albuterol  (DUONEB) 0.5-2.5 (3) MG/3ML nebulizer solution 3 mL (3 mLs Nebulization Given 06/16/24 0854)  dexamethasone  (DECADRON ) injection 10 mg (10 mg Intramuscular Given 06/16/24 0853)    Initial Impression / Assessment and Plan / UC Course  I have reviewed the triage vital signs and the nursing notes.  Pertinent labs & imaging results that were available during my care of the patient were reviewed by me and considered in my medical decision making (see chart for details).     Mild intermittent asthma with acute exacerbation  Acute non-recurrent maxillary sinusitis  Lower respiratory infection  Bilateral impacted cerumen  Flu A, flu B and COVID testing done today.  These tests are negative.  Symptoms and physical exam findings are most consistent with an asthma exacerbation secondary to a sinus infection with possible lower respiratory infection.  Chest x-ray was deferred today as it would not change the course of our treatment (x-ray was not available at our site today but no acute indication for urgent chest imaging).  Also on physical exam, the left ear canal was occluded with earwax and the right was partially occluded.  We have treated all conditions with the following: DuoNeb breathing treatment done today. Decadron  injection given today. This is a steroid to help with inflammation. Azithromycin  250mg  Take 2 tablets today and the 1 tablet daily for 4 more days. Prednisone  40 mg (2 tablets) once daily for 5 days. Take this in the morning.  This is a steroid to help with inflammation and pain. If breathing symptoms do not improve in the next  3 to 4 days or if symptoms worsen then return to urgent care as this may indicate you need a chest x-ray. Ear irrigation done today.  Tympanic membranes (eardrums) are clear.  Can use over-the-counter Debrox to help prevent earwax  buildup.  Do not use Q-tips to clean out the ears. Rest when needed.  Make sure to stay hydrated and drink plenty of water . Return to urgent care or PCP if symptoms worsen or fail to resolve.     Final Clinical Impressions(s) / UC Diagnoses   Final diagnoses:  Mild intermittent asthma with acute exacerbation  Acute non-recurrent maxillary sinusitis  Lower respiratory infection  Bilateral impacted cerumen     Discharge Instructions      Flu A, flu B and COVID testing done today.  These tests are negative.  Symptoms and physical exam findings are most consistent with an asthma exacerbation secondary to a sinus infection with possible lower respiratory infection.  Chest x-ray was deferred today as it would not change the course of our treatment (x-ray was not available at our site today but no acute indication for urgent chest imaging).  Also on physical exam, the left ear canal was occluded with earwax and the right was partially occluded.  We have treated all conditions with the following: DuoNeb breathing treatment done today. Decadron  injection given today. This is a steroid to help with inflammation. Azithromycin  250mg  Take 2 tablets today and the 1 tablet daily for 4 more days. Prednisone  40 mg (2 tablets) once daily for 5 days. Take this in the morning.  This is a steroid to help with inflammation and pain. Benzonatate  (tessalon ) 100 mg every 8 hours as needed for cough.   If breathing symptoms do not improve in the next 3 to 4 days or if symptoms worsen then return to urgent care as this may indicate you need a chest x-ray. Ear irrigation done today.  Tympanic membranes (eardrums) are clear.  Can use over-the-counter Debrox to help prevent earwax buildup.  Do not use Q-tips to clean out the ears. Rest when needed.  Make sure to stay hydrated and drink plenty of water . Return to urgent care or PCP if symptoms worsen or fail to resolve.      ED Prescriptions     Medication Sig  Dispense Auth. Provider   azithromycin  (ZITHROMAX ) 250 MG tablet Take first 2 tablets together, then 1 every day until finished. 6 tablet Nare Gaspari A, PA-C   predniSONE  (DELTASONE ) 20 MG tablet Take 2 tablets (40 mg total) by mouth daily with breakfast for 5 days. 10 tablet Teresa Norris A, PA-C   benzonatate  (TESSALON ) 100 MG capsule Take 1 capsule (100 mg total) by mouth every 8 (eight) hours as needed for cough. 21 capsule Teresa Norris LABOR, [place]      PDMP not reviewed this encounter.   Teresa Norris LABOR, PA-C 06/16/24 [phone]

## 2024-06-17 DIAGNOSIS — M47816 Spondylosis without myelopathy or radiculopathy, lumbar region: Secondary | ICD-10-CM | POA: Diagnosis not present

## 2024-06-17 DIAGNOSIS — G894 Chronic pain syndrome: Secondary | ICD-10-CM | POA: Diagnosis not present

## 2024-06-17 DIAGNOSIS — M62838 Other muscle spasm: Secondary | ICD-10-CM | POA: Diagnosis not present

## 2024-06-18 DIAGNOSIS — M62838 Other muscle spasm: Secondary | ICD-10-CM | POA: Diagnosis not present

## 2024-06-18 DIAGNOSIS — M47816 Spondylosis without myelopathy or radiculopathy, lumbar region: Secondary | ICD-10-CM | POA: Diagnosis not present

## 2024-06-18 DIAGNOSIS — G894 Chronic pain syndrome: Secondary | ICD-10-CM | POA: Diagnosis not present

## 2024-06-19 ENCOUNTER — Telehealth: Payer: Self-pay | Admitting: Physician Assistant

## 2024-06-19 ENCOUNTER — Other Ambulatory Visit: Payer: Self-pay

## 2024-06-19 DIAGNOSIS — F5101 Primary insomnia: Secondary | ICD-10-CM

## 2024-06-19 MED ORDER — ZOLPIDEM TARTRATE 10 MG PO TABS: 5.0000 mg | ORAL_TABLET | Freq: Every evening | ORAL | 0 refills | Status: DC | PRN

## 2024-06-19 NOTE — Telephone Encounter (Signed)
 Pt called reporting clonazepam  was mailed from pharmacy but she has not received. Anything TH can send to local pharmacy to help with her anxiety until received?. Return call [phone] Apt 7/24

## 2024-06-19 NOTE — Telephone Encounter (Signed)
 I sent this on 7/2 and not sure what happened to it, but hasn't been approved. Resending.   LF 12/11 - Rx had been sent to [facility] in April, but they can no longer get her preferred manufacturer. Canceled RF at [facility] and pended to [facility], who can get [employer].

## 2024-06-19 NOTE — Telephone Encounter (Signed)
 RF had been sent to provider on 7/2 and has not been addressed. Resent RF request to provider.

## 2024-06-20 NOTE — Telephone Encounter (Signed)
 PO had tried to deliver. Pt picked up at the post office today. Pt said PO had come twice but there was no car in the driveway and the door was not open, said they didn't come to the door.

## 2024-06-21 ENCOUNTER — Encounter: Payer: Self-pay | Admitting: Advanced Practice Midwife

## 2024-06-21 DIAGNOSIS — M25611 Stiffness of right shoulder, not elsewhere classified: Secondary | ICD-10-CM | POA: Diagnosis not present

## 2024-06-21 DIAGNOSIS — Z96611 Presence of right artificial shoulder joint: Secondary | ICD-10-CM | POA: Diagnosis not present

## 2024-06-21 DIAGNOSIS — G4733 Obstructive sleep apnea (adult) (pediatric): Secondary | ICD-10-CM | POA: Diagnosis not present

## 2024-06-24 ENCOUNTER — Ambulatory Visit: Admitting: Psychiatry

## 2024-06-24 DIAGNOSIS — F419 Anxiety disorder, unspecified: Secondary | ICD-10-CM | POA: Diagnosis not present

## 2024-06-24 NOTE — Progress Notes (Signed)
 [facility] Counselor/Therapist Progress Note  Patient ID: Chelsea Benson, MRN: [id number],    Date: 06/24/2024  Time Spent: 50 minutes   Treatment Type: Individual Therapy  Virtual Visit via Telehealth Note: MyChart telephone session: Connected with patient by a telemedicine/telehealth application, with their informed consent, and verified patient privacy and that I am speaking with the correct person using two identifiers. I discussed the limitations, risks, security and privacy concerns of performing psychotherapy and the availability of in person appointments. I also discussed with the patient that there may be a patient responsible charge related to this service. The patient expressed understanding and agreed to proceed. I discussed the treatment planning with the patient. The patient was provided an opportunity to ask questions and all were answered. The patient agreed with the plan and demonstrated an understanding of the instructions. The patient was advised to call  our office if  symptoms worsen or feel they are in a crisis state and need immediate contact.   Therapist Location: office Patient Location: home   Reported Symptoms: stressed out but better, anxiety decreased, depression improving, not worrying too much unless something happens     Mental Status Exam:  Appearance:   N/a  patient having to do telephone visit today as can't get her picture working on video     Behavior:  Appropriate, Sharing, and Motivated  Motor:  Impacted by some health issues  Speech/Language:   Clear and Coherent  Affect:  N/a  unable to have video and is doing telephone session  Mood:  Some anxiety, some depression, not worried as much  Thought process:  goal directed  Thought content:    Rumination  Sensory/Perceptual disturbances:    WNL  Orientation:  oriented to person, place, time/date, situation, day of week, month of year, year, and stated date of June 24, 2024  Attention:   Good  Concentration:  Good  Memory:  WNL  Fund of knowledge:   Good and Fair  Insight:    Good  Judgment:   Good  Impulse Control:  Good   Risk Assessment: Danger to Self:  No Self-injurious Behavior: No Danger to Others: No Duty to Warn:no Physical Aggression / Violence:No  Access to Firearms a concern: No  Gang Involvement:No   Subjective:  Patient today motivated and working further on her anxiety, stress, depression, and worrying which are all some better and she continues to work on better managing them. Due to recent surgery, she stayed a while with her daughter but has now returned home and is doing well so far. Alcoholic brother still living with her and his drinking has not decreased and his attitude sucks. States she is maintaining boundaries with brother. Her health care aide comes 5 days weekly to help with cleaning, showering, laundry. Recovering well from surgery and stress has decreased some except for the ongoing stress with brother. Continues to work with this but not much progress. Other that this issue, she does have some good family support. Progressing from her surgery especially her arm. States she also feels she is emotionally progressing by pushing myself to get better and noticing some improvement. Have learned more about herself including how to be a better person, being able to depend on herself more as well as other people. Feeling more grounded at times and doesn't worry as much, healthier boundaries with brother and others, and able to say no when needed and stick with it.   Interventions: Cognitive Behavioral Therapy, Solution-Oriented/Positive  Psychology, and Ego-Supportive Long term goal: (measurable) Reduce overall level, frequency, and intensity of the anxiety so that daily functioning is not impaired.  Patient will eventually report a rating of 4 or less on 1-10 anxiety scale for a 93-month time period where her daily functioning is not impaired.   Short term goal: Increase understanding of beliefs and messages that produce the worry and anxiety. Strategy: Patient will explore and work to stop cognitive messages that feed anxiety and work to replace them with more positive and empowering messages.   Diagnosis:   ICD-10-CM   1. Anxiety disorder, unspecified type  F41.9      Plan: Patient feeling more encouraged and more strength.  Noticing less negativity in her thoughts and not worrying nor overthinking quite as much.  Also better at not assuming worst-case scenarios.  Appreciating the support of family and friends.  She notes that the stressors that do occur within the family tend to be more episodic stressors that they can deal with, with the exception of her brother and his alcoholism.  Patient to continue working with her goal-directed behaviors and daily life inside and outside of sessions.  Continues working on goal-directed behaviors and showing progress especially with her anxiety, worry, and interrupting negative thought patterns or assumptions.  Goal review and progress/challenges noted with patient.  Next appointment within 3 to 4 weeks.   Barnie Bunde, LCSW

## 2024-06-27 ENCOUNTER — Encounter: Payer: Self-pay | Admitting: Physician Assistant

## 2024-06-27 ENCOUNTER — Telehealth: Admitting: Physician Assistant

## 2024-06-27 DIAGNOSIS — F5101 Primary insomnia: Secondary | ICD-10-CM | POA: Diagnosis not present

## 2024-06-27 DIAGNOSIS — F410 Panic disorder [episodic paroxysmal anxiety] without agoraphobia: Secondary | ICD-10-CM | POA: Diagnosis not present

## 2024-06-27 DIAGNOSIS — F419 Anxiety disorder, unspecified: Secondary | ICD-10-CM

## 2024-06-27 DIAGNOSIS — F3181 Bipolar II disorder: Secondary | ICD-10-CM | POA: Diagnosis not present

## 2024-06-27 MED ORDER — ZOLPIDEM TARTRATE 10 MG PO TABS: 5.0000 mg | ORAL_TABLET | Freq: Every evening | ORAL | 0 refills | Status: DC | PRN

## 2024-06-27 MED ORDER — CLONAZEPAM 0.5 MG PO TABS: ORAL_TABLET | ORAL | 5 refills | Status: AC

## 2024-06-27 NOTE — Progress Notes (Signed)
 [facility] Med Check  Patient ID: Chelsea Benson,  MRN: [id number]  PCP: Dayna Motto, DO  Date of Evaluation: 06/27/2024 Time spent:25 minutes  Chief Complaint:  Chief Complaint   Anxiety; Insomnia; Depression; Follow-up   Virtual Visit via Telehealth  I connected with patient by a video enabled telemedicine application with their informed consent, and verified patient privacy and that I am speaking with the correct person using two identifiers.  I am private, in my office and the patient is at home.  I discussed the limitations, risks, security and privacy concerns of performing an evaluation and management service by video and the availability of in person appointments. I also discussed with the patient that there may be a patient responsible charge related to this service. The patient expressed understanding and agreed to proceed.   I discussed the assessment and treatment plan with the patient. The patient was provided an opportunity to ask questions and all were answered. The patient agreed with the plan and demonstrated an understanding of the instructions.   The patient was advised to call back or seek an in-person evaluation if the symptoms worsen or if the condition fails to improve as anticipated.  I provided approximately 25 minutes of non-face-to-face time during this encounter.  HISTORY/CURRENT STATUS: HPI for routine 48-month med check  She is doing well as far as her medications go except for one problem.  She was only given a 45-day supply of the Ambien .  It was written for 10 mg, 1/2-1 nightly as needed.  Patient reports that 5 mg is not helpful at all.  She has been on Ambien  for quite a while and her insurance (or pharmacy) has just started giving her the decreased quantity.  She practices good sleep hygiene as best she can.  Patient is able to enjoy things.  Energy and motivation are fair right now, she has been taking more naps during the day since she had  her shoulder replacement.  No extreme sadness, tearfulness, or feelings of hopelessness.  ADLs and personal hygiene are normal.   Denies any changes in concentration, making decisions, or remembering things.  Appetite has not changed.  Weight is stable.  Not having panic attacks but does get overwhelmed easily.  The Klonopin  is helpful.  No mania, delirium, AH/VH.  No SI/HI.  Denies dizziness, syncope, seizures, numbness, tingling, tremor, tics, unsteady gait, slurred speech, confusion. Denies muscle or joint pain, stiffness, or dystonia. Denies unexplained weight loss, frequent infections, or sores that heal slowly.  No polyphagia, polydipsia, or polyuria. Denies visual changes or paresthesias.   Individual Medical History/ Review of Systems: Changes? 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  Currently in PT.  Past Psychiatric Medication Trials: Cymbalta-hair loss Mirtazapine  Prozac  Trazodone -ineffective Ambien  Belsomra  BuSpar  Hydroxyzine  Abilify  Trileptal  Xanax  Allergies: Ramipril , Cymbalta [duloxetine hcl], Methylprednisolone  acetate, Rosuvastatin calcium , Iodinated contrast media, Almond meal (obsolete), Almond oil, Carvedilol, Fish allergy, Losartan potassium, Methylprednisolone , Mirabegron, and Morphine and codeine  Current Medications:  Current Outpatient Medications:    albuterol  (PROVENTIL ) (2.5 MG/3ML) 0.083% nebulizer solution, Take 3 mLs (2.5 mg total) by nebulization every 4 (four) hours as needed for wheezing or shortness of breath. USE 1 VIAL IN NEBULIZER EVERY 4 HOURS - and as needed, Disp: 150 mL, Rfl: 0   albuterol  (VENTOLIN  HFA) 108 (90 Base) MCG/ACT inhaler, Inhale 2 puffs into the lungs every 4 (four) hours as needed for wheezing or shortness of breath., Disp: 54 g, Rfl: 1   apixaban  (ELIQUIS ) 5 MG  TABS tablet, Take 1 tablet (5 mg total) by mouth 2 (two) times daily., Disp: 200 tablet, Rfl: 1   ARIPiprazole  (ABILIFY ) 2 MG tablet, TAKE 2 TABLETS BY MOUTH DAILY,  Disp: 180 tablet, Rfl: 0   azelastine  (ASTELIN ) 0.1 % nasal spray, Place 2 sprays into both nostrils 2 (two) times daily., Disp: 90 mL, Rfl: 1   Bepotastine  Besilate 1.5 % SOLN, Place 1 drop into both eyes daily as needed (allergies)., Disp: , Rfl:    Blood Glucose Monitoring Suppl (ACCU-CHEK GUIDE) w/Device KIT, See admin instructions., Disp: , Rfl: 1   busPIRone  (BUSPAR ) 30 MG tablet, Take 1 tablet (30 mg total) by mouth 2 (two) times daily., Disp: 180 tablet, Rfl: 0   Calcium -Magnesium-Zinc-Vit D3 (CALCIUM -MAGNESIUM-ZINC-D3 PO), Take 1 tablet by mouth daily., Disp: , Rfl:    desonide  (DESOWEN ) 0.05 % ointment, APPLY TO THE AFFECTED AREA(S) TOPICALLY TWICE DAILY AS NEEDED, Disp: 180 g, Rfl: 0   diclofenac Sodium (VOLTAREN ARTHRITIS PAIN) 1 % GEL, Apply 2 g topically daily as needed (Knee pain)., Disp: , Rfl:    diltiazem  (CARDIZEM ) 60 MG tablet, Take 1 tablet (60 mg total) by mouth every 6 (six) hours as needed for palpitation episode. MAX up to 2 tablets per day., Disp: 180 tablet, Rfl: 2   Epinastine HCl 0.05 % ophthalmic solution, Place 1 drop into both eyes daily as needed (itchy/watery eyes)., Disp: 15 mL, Rfl: 0   EPINEPHrine  0.3 mg/0.3 mL IJ SOAJ injection, Inject 0.3 mg into the muscle as needed for anaphylaxis. INJECT INTRAMUSCULARLY 1  PEN AS NEEDED FOR ALLERGIC  RESPONSE AS DIRECTED BY MD. SEEK MEDICAL HELP AFTER  USE., Disp: 2 each, Rfl: 0   famotidine  (PEPCID ) 20 MG tablet, TAKE 1 TABLET BY MOUTH DAILY, Disp: 90 tablet, Rfl: 3   ferrous sulfate 325 (65 FE) MG tablet, Take 325 mg by mouth daily with breakfast., Disp: , Rfl:    fexofenadine  (ALLEGRA ) 180 MG tablet, Take 1 tablet (180 mg total) by mouth daily., Disp: 90 tablet, Rfl: 1   fluticasone  (FLONASE ) 50 MCG/ACT nasal spray, USE 2 SPRAYS IN BOTH NOSTRILS  DAILY, Disp: 48 g, Rfl: 1   Fluticasone  Furoate (ARNUITY ELLIPTA ) 100 MCG/ACT AEPB, Inhale 1 puff into the lungs daily as needed., Disp: 30 each, Rfl: 5   GEMTESA 75 MG TABS, Take  75 mg by mouth daily., Disp: , Rfl:    hydrochlorothiazide  (HYDRODIURIL ) 25 MG tablet, Take 1 tablet (25 mg total) by mouth daily., Disp: 30 tablet, Rfl: 0   hydrocortisone  2.5 % cream, Apply 1 Application topically 2 (two) times daily., Disp: 30 g, Rfl: 0   ipratropium (ATROVENT ) 0.06 % nasal spray, Place 2 sprays into both nostrils 4 (four) times daily as needed for rhinitis., Disp: 15 mL, Rfl: 5   Lancets (ONETOUCH DELICA PLUS LANCET33G) MISC, SMARTSIG:1 Topical Daily, Disp: , Rfl:    lidocaine  (LIDODERM ) 5 %, Place 1 patch onto the skin daily as needed (pain)., Disp: , Rfl:    Mepolizumab  (NUCALA ) 100 MG/ML SOAJ, Inject 1 mL (100 mg total) into the skin every 28 (twenty-eight) days., Disp: 1 mL, Rfl: 11   metFORMIN  (GLUCOPHAGE -XR) 500 MG 24 hr tablet, Take 1,000 mg by mouth in the morning and at bedtime., Disp: , Rfl:    methocarbamol  (ROBAXIN ) 500 MG tablet, Take 1 tablet (500 mg total) by mouth every 6 (six) hours as needed for muscle spasms., Disp: 30 tablet, Rfl: 0   mirtazapine  (REMERON ) 30 MG tablet, TAKE 1 TABLET  BY MOUTH AT  BEDTIME, Disp: 90 tablet, Rfl: 0   montelukast  (SINGULAIR ) 10 MG tablet, TAKE 1 TABLET BY MOUTH AT  BEDTIME, Disp: 100 tablet, Rfl: 2   Nebulizer MISC, 1 Device by Does not apply route as needed., Disp: 1 each, Rfl: 1   Oxcarbazepine  (TRILEPTAL ) 300 MG tablet, TAKE 2 TABLETS BY MOUTH AT  BEDTIME, Disp: 180 tablet, Rfl: 0   pantoprazole  (PROTONIX ) 40 MG tablet, TAKE 1 TABLET BY MOUTH EVERY  MORNING, Disp: 90 tablet, Rfl: 3   potassium chloride  SA (KLOR-CON  M) 20 MEQ tablet, Take 1 tablet (20 mEq total) by mouth daily., Disp: 30 tablet, Rfl: 0   pravastatin  (PRAVACHOL ) 20 MG tablet, Take 1 tablet (20 mg total) by mouth at bedtime., Disp: 90 tablet, Rfl: 3   pregabalin  (LYRICA ) 100 MG capsule, Take 100 mg by mouth 3 (three) times daily., Disp: , Rfl:    Respiratory Therapy Supplies (NEBULIZER) DEVI, Use as directed with nebulizer solution., Disp: 1 each, Rfl: 1    Respiratory Therapy Supplies (NEBULIZER/TUBING/MOUTHPIECE) KIT, Use as directed with nebulizer machine, Disp: 1 kit, Rfl: 12   Semaglutide , 1 MG/DOSE, (OZEMPIC , 1 MG/DOSE,) 2 MG/1.5ML SOPN, Take 1 mg by mouth once a week., Disp: , Rfl:    Spacer/Aero-Holding Chambers (BREATHE COMFORT CHAMBER/ADULT) DEVI, 1 each by Does not apply route daily as needed., Disp: 1 each, Rfl: 1   SYMBICORT  160-4.5 MCG/ACT inhaler, INHALE 2 INHALATIONS BY MOUTH  INTO THE LUNGS IN THE MORNING  AND AT BEDTIME, Disp: 30.6 g, Rfl: 3   traMADol (ULTRAM) 50 MG tablet, 1 tablet as needed Oral twice a day; Duration: 12 days, Disp: , Rfl:    triamcinolone  (NASACORT ) 55 MCG/ACT AERO nasal inhaler, Place 2 sprays into the nose daily., Disp: 16.9 mL, Rfl: 5   triamcinolone  ointment (KENALOG ) 0.1 %, Apply 1 Application topically 2 (two) times daily., Disp: 30 g, Rfl: 0   ACCU-CHEK GUIDE test strip, TEST BID PRN, Disp: , Rfl: 1   azithromycin  (ZITHROMAX ) 250 MG tablet, Take first 2 tablets together, then 1 every day until finished. (Patient not taking: Reported on 06/27/2024), Disp: 6 tablet, Rfl: 0   benzonatate  (TESSALON ) 100 MG capsule, Take 1 capsule (100 mg total) by mouth every 8 (eight) hours as needed for cough. (Patient not taking: Reported on 06/27/2024), Disp: 21 capsule, Rfl: 0   clonazePAM  (KLONOPIN ) 0.5 MG tablet, Take 1/2-1 tab po TID prn anxiety. (Do not combine with Ambien ), Disp: 45 tablet, Rfl: 5   diltiazem  (CARTIA  XT) 300 MG 24 hr capsule, TAKE 1 CAPSULE BY MOUTH DAILY, Disp: 90 capsule, Rfl: 3   oxyCODONE  (ROXICODONE ) 5 MG immediate release tablet, Take 1 tablet (5 mg total) by mouth every 4 (four) hours as needed for severe pain (pain score 7-10). (Patient not taking: Reported on 06/27/2024), Disp: 30 tablet, Rfl: 0   zolpidem  (AMBIEN ) 10 MG tablet, Take 0.5-1 tablets (5-10 mg total) by mouth at bedtime as needed for sleep., Disp: 90 tablet, Rfl: 0 Medication Side Effects: none  Family Medical/ Social History:  Changes? No  MENTAL HEALTH EXAM:  There were no vitals taken for this visit.There is no height or weight on file to calculate BMI.  General Appearance: Casual and Well Groomed  Eye Contact:  Good  Speech:  Clear and Coherent and Normal Rate  Volume:  Normal  Mood:  Euthymic  Affect:  Congruent  Thought Process:  Goal Directed and Descriptions of Associations: Circumstantial  Orientation:  Full (Time, Place, and  Person)  Thought Content: Logical   Suicidal Thoughts:  No  Homicidal Thoughts:  No  Memory:  WNL  Judgement:  Good  Insight:  Good  Psychomotor Activity:  Normal  Concentration:  Concentration: Good  Recall:  Good  Fund of Knowledge: Good  Language: Good  Assets:  Communication Skills Desire for Improvement Financial Resources/Insurance Housing  ADL's:  Intact  Cognition: WNL  Prognosis:  Good   PCP follows labs  DIAGNOSES:    ICD-10-CM   1. Bipolar II disorder (HCC)  F31.81     2. Anxiety disorder, unspecified type  F41.9     3. Primary insomnia  F51.01 zolpidem  (AMBIEN ) 10 MG tablet    4. Panic disorder  F41.0 clonazePAM  (KLONOPIN ) 0.5 MG tablet     Receiving Psychotherapy: Yes with Marval Bunde, LCSW  RECOMMENDATIONS:   PDMP reviewed.  Ambien  for 06/05/2024.  Klonopin  filled 06/04/2024. I provided approximately 25 minutes of non-face-to-face time during this encounter, including time spent before and after the visit in records review, medical decision making, counseling pertinent to today's visit, and charting.   We discussed sleep hygiene.  In women and people over 15 it is recommended to take only 5 mg of Ambien .  However she has been on Ambien  for a long time and 10 mg is the only thing that will help.  I have requested the pharmacy give her the 10 mg, the full pill every night.  Continue Abilify  2 mg, 2 p.o. daily. Continue BuSpar  30 mg, 1 p.o. twice daily. Continue Klonopin  0.5 mg, 1/2-1 p.o. 3 times daily as needed. Continue mirtazapine  30 mg, 1  p.o. nightly. Continue Trileptal  300 mg, 2 p.o. nightly. Continue Ambien  10 mg, 1/2-1 p.o. nightly as needed sleep. Continue therapy with Marval Bunde, LCSW. Return in 3 months.     Verneita Cooks, PA-C

## 2024-07-03 DIAGNOSIS — M25611 Stiffness of right shoulder, not elsewhere classified: Secondary | ICD-10-CM | POA: Diagnosis not present

## 2024-07-04 DIAGNOSIS — M25611 Stiffness of right shoulder, not elsewhere classified: Secondary | ICD-10-CM | POA: Diagnosis not present

## 2024-07-04 DIAGNOSIS — Z96611 Presence of right artificial shoulder joint: Secondary | ICD-10-CM | POA: Diagnosis not present

## 2024-07-10 ENCOUNTER — Other Ambulatory Visit: Payer: Self-pay | Admitting: Physician Assistant

## 2024-07-10 DIAGNOSIS — F419 Anxiety disorder, unspecified: Secondary | ICD-10-CM

## 2024-07-10 DIAGNOSIS — F5101 Primary insomnia: Secondary | ICD-10-CM

## 2024-07-10 DIAGNOSIS — F3181 Bipolar II disorder: Secondary | ICD-10-CM

## 2024-07-12 NOTE — Telephone Encounter (Signed)
 SABRA

## 2024-07-15 ENCOUNTER — Ambulatory Visit: Admitting: Psychiatry

## 2024-07-15 DIAGNOSIS — F3181 Bipolar II disorder: Secondary | ICD-10-CM | POA: Diagnosis not present

## 2024-07-15 NOTE — Progress Notes (Signed)
 [facility] Counselor/Therapist Progress Note  Patient ID: Chelsea Benson, MRN: [id number],    Date: 07/15/2024  Time Spent: 45 minutes   Treatment Type: Individual Therapy  Virtual Visit via Telehealth Note: MyChart Video session Connected with patient by a telemedicine/telehealth application, with their informed consent, and verified patient privacy and that I am speaking with the correct person using two identifiers. I discussed the limitations, risks, security and privacy concerns of performing psychotherapy and the availability of in person appointments. I also discussed with the patient that there may be a patient responsible charge related to this service. The patient expressed understanding and agreed to proceed. I discussed the treatment planning with the patient. The patient was provided an opportunity to ask questions and all were answered. The patient agreed with the plan and demonstrated an understanding of the instructions. The patient was advised to call  our office if  symptoms worsen or feel they are in a crisis state and need immediate contact.   Therapist Location: office Patient Location: home    Reported Symptoms: Anxiety, stressed, less worrying    Mental Status Exam:  Appearance:   Casual     Behavior:  Appropriate, Sharing, and Motivated  Motor:  Using cane and rollator  Speech/Language:   Clear and Coherent  Affect:  anxious  Mood:  anxious  Thought process:  goal directed  Thought content:    WNL  Sensory/Perceptual disturbances:    WNL  Orientation:  oriented to person, place, time/date, situation, day of week, month of year, year, and stated date of Aug. 11, 2025  Attention:  Good  Concentration:  Good  Memory:  WNL  Fund of knowledge:   Good  Insight:    Good and Fair  Judgment:   Good  Impulse Control:  Good   Risk Assessment: Danger to Self:  No Self-injurious Behavior: No Danger to Others: No Duty to Warn:no Physical Aggression /  Violence:No  Access to Firearms a concern: No  Gang Involvement:No   Subjective:  Patient today in MyChartVideo session reporting anxiety, stressed, but less overall worrying. Sleep is good. Looking forward to visiting friend in [place]  next month for 3 wks. States her anxiety and stress is mostly my problems right now and when I'm stressed my eczema comes out. Feels motivated more than usual especially when doing tasks I couldn't do for a while. Still having problems with alcoholic brother that also stays at her home (which her adult daughter owns). Both daughter and patient have talked with brother his behavior and it is some better but not much, but he is drinking a little less. Patient maintaining her boundaries with brother. Still has her health care aide ( 5 days per week) that helps with daily living tasks (laundry, showering, cleaning, cooking), but patient shares she has started to cook a little bit. Continued recovery after surgery and states she can not raise both arms up in the air which is really progressing for her. Good family support. Reports that she is also continuing to improve emotionally which is encouraging for patient.  Reports less worrying overall, maintaining healthier boundaries with her brother especially and his alcoholism, and saying no more often as needed.   Interventions: Cognitive Behavioral Therapy, Solution-Oriented/Positive Psychology, and Ego-Supportive Long term goal: (measurable) Reduce overall level, frequency, and intensity of the anxiety so that daily functioning is not impaired.  Patient will eventually report a rating of 4 or less on 1-10 anxiety scale for a 42-month  time period where her daily functioning is not impaired.  Short term goal: Increase understanding of beliefs and messages that produce the worry and anxiety. Strategy: Patient will explore and work to stop cognitive messages that feed anxiety and work to replace them with more positive  and empowering messages.    Diagnosis:   ICD-10-CM   1. Bipolar II disorder (HCC)  F31.81      Plan: Patient today is likely stronger as she is continuing to make progress after having surgery a few weeks ago.  Feels that she is becoming more self-sufficient even though she still has her aide with her for now.  Is able to do a few more skills than when we last spoke and feels that she will be able to keep going forward to regain more of her skills.  Continuing to work on her anxiety and stress management, and able to cite some specific examples of her improvement.  Continues to feel increased strength and more encouragement, less negativity and less self-negating, decrease in her worrying and overthinking, and excited to be going to visit a friend in [place]  in a few weeks.  Family is supportive.  Caregiver has been very helpful and continues to be.  Patient remains motivated and continues to work on her goal-directed behaviors showing some gradual progress particularly in her worrying, her anxiety, and decreasing her negative thought patterns and assumptions.  Goal review and progress/challenges noted with patient.  Next appointment within 3 to 4 weeks.   Barnie Bunde, LCSW

## 2024-07-19 DIAGNOSIS — M62838 Other muscle spasm: Secondary | ICD-10-CM | POA: Diagnosis not present

## 2024-07-19 DIAGNOSIS — G894 Chronic pain syndrome: Secondary | ICD-10-CM | POA: Diagnosis not present

## 2024-07-19 DIAGNOSIS — M47816 Spondylosis without myelopathy or radiculopathy, lumbar region: Secondary | ICD-10-CM | POA: Diagnosis not present

## 2024-07-23 ENCOUNTER — Other Ambulatory Visit (HOSPITAL_COMMUNITY): Payer: Self-pay

## 2024-07-24 DIAGNOSIS — N3281 Overactive bladder: Secondary | ICD-10-CM | POA: Diagnosis not present

## 2024-07-24 DIAGNOSIS — M549 Dorsalgia, unspecified: Secondary | ICD-10-CM | POA: Diagnosis not present

## 2024-08-05 DIAGNOSIS — G4733 Obstructive sleep apnea (adult) (pediatric): Secondary | ICD-10-CM | POA: Diagnosis not present

## 2024-08-08 ENCOUNTER — Telehealth: Payer: Self-pay | Admitting: Cardiology

## 2024-08-08 MED ORDER — DILTIAZEM HCL 60 MG PO TABS: 60.0000 mg | ORAL_TABLET | Freq: Four times a day (QID) | ORAL | 2 refills | Status: AC

## 2024-08-08 NOTE — Telephone Encounter (Signed)
 Pt's medication was sent to pt's pharmacy as requested. Confirmation received.

## 2024-08-08 NOTE — Telephone Encounter (Signed)
*  STAT* If patient is at the pharmacy, call can be transferred to refill team.   1. Which medications need to be refilled? (please list name of each medication and dose if known) diltiazem  (CARDIZEM ) 60 MG tablet diltiazem  (CARDIZEM ) 60 MG tablet   2. Which pharmacy/location (including street and city if local pharmacy) is medication to be sent to?  [id number] - [place], [place] - [address] AT [address]    3. Do they need a 30 day or 90 day supply? 90

## 2024-08-27 ENCOUNTER — Other Ambulatory Visit: Payer: Self-pay

## 2024-08-27 MED ORDER — HYDROCHLOROTHIAZIDE 25 MG PO TABS: 25.0000 mg | ORAL_TABLET | Freq: Every day | ORAL | 1 refills | Status: DC

## 2024-08-31 ENCOUNTER — Other Ambulatory Visit: Payer: Self-pay | Admitting: Allergy

## 2024-09-02 ENCOUNTER — Telehealth: Payer: Self-pay | Admitting: Physician Assistant

## 2024-09-02 DIAGNOSIS — E1159 Type 2 diabetes mellitus with other circulatory complications: Secondary | ICD-10-CM | POA: Diagnosis not present

## 2024-09-02 NOTE — Telephone Encounter (Signed)
 error

## 2024-09-02 NOTE — Telephone Encounter (Signed)
 Pt has RF available at [facility]. Notified her.

## 2024-09-02 NOTE — Telephone Encounter (Signed)
 Patient called in regarding Clonazepam  0.5mg  states that she is having trouble getting medication from her mail order pharmacy and would like medication sent to local pharmacy instead. Sent new prescription to  [facility] [address] [place] Appt 10/24

## 2024-09-10 DIAGNOSIS — E1140 Type 2 diabetes mellitus with diabetic neuropathy, unspecified: Secondary | ICD-10-CM | POA: Diagnosis not present

## 2024-09-10 DIAGNOSIS — I480 Paroxysmal atrial fibrillation: Secondary | ICD-10-CM | POA: Diagnosis not present

## 2024-09-10 DIAGNOSIS — E1159 Type 2 diabetes mellitus with other circulatory complications: Secondary | ICD-10-CM | POA: Diagnosis not present

## 2024-09-10 DIAGNOSIS — I10 Essential (primary) hypertension: Secondary | ICD-10-CM | POA: Diagnosis not present

## 2024-09-10 DIAGNOSIS — E785 Hyperlipidemia, unspecified: Secondary | ICD-10-CM | POA: Diagnosis not present

## 2024-09-11 ENCOUNTER — Other Ambulatory Visit: Payer: Self-pay | Admitting: Physician Assistant

## 2024-09-11 DIAGNOSIS — F419 Anxiety disorder, unspecified: Secondary | ICD-10-CM

## 2024-09-12 ENCOUNTER — Ambulatory Visit: Admitting: Psychiatry

## 2024-09-17 ENCOUNTER — Other Ambulatory Visit: Payer: Self-pay | Admitting: Allergy

## 2024-09-19 DIAGNOSIS — G4733 Obstructive sleep apnea (adult) (pediatric): Secondary | ICD-10-CM | POA: Diagnosis not present

## 2024-09-20 ENCOUNTER — Ambulatory Visit: Admitting: Psychiatry

## 2024-09-20 DIAGNOSIS — F419 Anxiety disorder, unspecified: Secondary | ICD-10-CM | POA: Diagnosis not present

## 2024-09-20 NOTE — Progress Notes (Signed)
 [facility] Counselor/Therapist Progress Note  Patient ID: Chelsea Benson, MRN: [id number],    Date: 09/20/2024  Time Spent: 53 minutes   Treatment Type: Individual Therapy  Reported Symptoms:     Anxiety, stressed, but not worrying    Mental Status Exam:  Appearance:   Casual and Neat     Behavior:  Appropriate, Sharing, and Motivated  Motor:  Impacted by her physical issues  Speech/Language:   Normal Rate  Affect:  anxiety  Mood:  anxious  Thought process:  goal directed  Thought content:    Rumination  Sensory/Perceptual disturbances:    WNL  Orientation:  oriented to person, place, time/date, situation, day of week, month of year, year, and stated date of Oct. 17, 2025  Attention:  Good  Concentration:  Good  Memory:  WNL  Fund of knowledge:   Good  Insight:    Good and Fair  Judgment:   Good  Impulse Control:  Good   Risk Assessment: Danger to Self:  No Self-injurious Behavior: No Danger to Others: No Duty to Warn:no Physical Aggression / Violence:No  Access to Firearms a concern: No  Gang Involvement:No   Subjective:  Patient today came into office for her appt rather than doing a video session. Casual, neat dress and mood is pretty good. Wants to work on better communication within my family/adult children which we made a priority in session today. Needs for daughter to be more thoughtful of patient and let patient know of changes in their plans/schedules ahead of time. Patient frustrated and talking through multiple issues related to family, and together worked on different ways of managing her frustration better verbally and in her behavior. Trying to have better boundaries with others and set healthier limits, which she is feeling good about but needing to be more consistent with this and other changes. Motivation pretty good. 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 Alcoholic brother continues to abuse alcohol and still living  with patient. Still has health care aide that helps with some tasks around her home. Showing progress in her mobility. Good support from friends and family. Improving emotionally, decreasing worry.  Talked about some sensitive issues today regarding family but also showed some brightness in her mood and enjoyed a few laughs.    Interventions: Cognitive Behavioral Therapy, Solution-Oriented/Positive Psychology, and Ego-Supportive Long term goal: (measurable) Reduce overall level, frequency, and intensity of the anxiety so that daily functioning is not impaired.  Patient will eventually report a rating of 4 or less on 1-10 anxiety scale for a 77-month time period where her daily functioning is not impaired.  Short term goal: Increase understanding of beliefs and messages that produce the worry and anxiety. Strategy: Patient will explore and work to stop cognitive messages that feed anxiety and work to replace them with more positive and empowering messages.    Diagnosis:   ICD-10-CM   1. Anxiety disorder, unspecified type  F41.9      Plan:  Patient continues her work on goal-directed behaviors, noticing more strength in her moving around physically and able to do more for herself. Some noted progress in her management of stress, and in her being more self sufficient in some ways. Some decrease in worrying and overthinking.  Family supportive, in addition to several good friends. Feeling encouraged.   Goal review and progress/challenges noted with patient.  Next appointment within approximately 4 weeks.   Barnie Bunde, LCSW

## 2024-09-21 DIAGNOSIS — G4733 Obstructive sleep apnea (adult) (pediatric): Secondary | ICD-10-CM | POA: Diagnosis not present

## 2024-09-23 ENCOUNTER — Other Ambulatory Visit: Payer: Self-pay

## 2024-09-25 ENCOUNTER — Telehealth: Payer: Self-pay | Admitting: Cardiology

## 2024-09-25 MED ORDER — POTASSIUM CHLORIDE CRYS ER 20 MEQ PO TBCR: 20.0000 meq | EXTENDED_RELEASE_TABLET | Freq: Every day | ORAL | 1 refills | Status: DC

## 2024-09-25 NOTE — Telephone Encounter (Signed)
 RX sent in

## 2024-09-25 NOTE — Telephone Encounter (Signed)
*  STAT* If patient is at the pharmacy, call can be transferred to refill team.   1. Which medications need to be refilled? (please list name of each medication and dose if known)   potassium chloride  SA (KLOR-CON  M) 20 MEQ tablet     2. Would you like to learn more about the convenience, safety, & potential cost savings by using the [facility]? No     3. Are you open to using the [facility] (Type [facility]. No    4. Which pharmacy/location (including street and city if local pharmacy) is medication to be sent to?[facility] - [place], [place] - [address]    5. Do they need a 30 day or 90 day supply? 90 day

## 2024-09-27 ENCOUNTER — Encounter: Payer: Self-pay | Admitting: Physician Assistant

## 2024-09-27 ENCOUNTER — Telehealth: Admitting: Physician Assistant

## 2024-09-27 DIAGNOSIS — F3181 Bipolar II disorder: Secondary | ICD-10-CM

## 2024-09-27 DIAGNOSIS — F5101 Primary insomnia: Secondary | ICD-10-CM

## 2024-09-27 MED ORDER — ZOLPIDEM TARTRATE 10 MG PO TABS: 5.0000 mg | ORAL_TABLET | Freq: Every evening | ORAL | 0 refills | Status: AC | PRN

## 2024-09-27 NOTE — Progress Notes (Addendum)
 [facility] Med Check  Patient ID: Chelsea Benson,  MRN: [id number]  PCP: Dayna Motto, DO  Date of Evaluation: 09/27/2024 Time spent:20 minutes  Chief Complaint:  Chief Complaint   Anxiety; Depression; Insomnia; Follow-up    Virtual Visit via Telehealth  I connected with patient by a video enabled telemedicine application with their informed consent, and verified patient privacy and that I am speaking with the correct person using two identifiers.  I am private, in my office and the patient is at home.  I discussed the limitations, risks, security and privacy concerns of performing an evaluation and management service by video and the availability of in person appointments. I also discussed with the patient that there may be a patient responsible charge related to this service. The patient expressed understanding and agreed to proceed.   I discussed the assessment and treatment plan with the patient. The patient was provided an opportunity to ask questions and all were answered. The patient agreed with the plan and demonstrated an understanding of the instructions.   The patient was advised to call back or seek an in-person evaluation if the symptoms worsen or if the condition fails to improve as anticipated.  I provided approximately 20 minutes this encounter.  HISTORY/CURRENT STATUS: HPI for routine 35-month med check  Doing well with her psych meds. Is able to enjoy things.  Energy and motivation are good.   No extreme sadness, tearfulness, or feelings of hopelessness.  Sleeps ok.  ADLs and personal hygiene are normal.  No change in memory. Appetite has not changed.  Weight is stable.   Anxiety is controlled.  Klonopin  is helpful.  No SI/HI.  No reports of increased energy with decreased need for sleep, increased talkativeness, racing thoughts, impulsivity or risky behaviors, increased spending, grandiosity, increased irritability or anger, paranoia, or  hallucinations.  Denies dizziness, syncope, seizures, numbness, tingling, tremor, tics, unsteady gait, slurred speech, confusion. Denies muscle or joint pain, stiffness, or dystonia.  Individual Medical History/ Review of Systems: Changes? :Yes   pneumonia a few months.  Reconstruction of right rotator cuff.   Past Psychiatric Medication Trials: Cymbalta-hair loss Mirtazapine  Prozac  Trazodone -ineffective Ambien  Belsomra  BuSpar  Hydroxyzine  Abilify  Trileptal  Xanax  Allergies: Ramipril , Cymbalta [duloxetine hcl], Methylprednisolone  acetate, Rosuvastatin calcium , Iodinated contrast media, Almond meal (obsolete), Almond oil, Carvedilol, Fish allergy, Losartan potassium, Methylprednisolone , Mirabegron, and Morphine and codeine  Current Medications:  Current Outpatient Medications:    ACCU-CHEK GUIDE test strip, TEST BID PRN, Disp: , Rfl: 1   albuterol  (PROVENTIL ) (2.5 MG/3ML) 0.083% nebulizer solution, USE 1 VIAL VIA NEBULIZER EVERY 4 HOURS AS NEEDED FOR WHEEZING OR  SHORTNESS OF BREATH  (MANUFACTURER RECOMMENDS NOT  EXCEEDING 4 VIALS/DAY), Disp: 150 mL, Rfl: 26   albuterol  (VENTOLIN  HFA) 108 (90 Base) MCG/ACT inhaler, Inhale 2 puffs into the lungs every 4 (four) hours as needed for wheezing or shortness of breath., Disp: 54 g, Rfl: 1   apixaban  (ELIQUIS ) 5 MG TABS tablet, Take 1 tablet (5 mg total) by mouth 2 (two) times daily., Disp: 200 tablet, Rfl: 1   ARIPiprazole  (ABILIFY ) 2 MG tablet, TAKE 2 TABLETS BY MOUTH DAILY, Disp: 180 tablet, Rfl: 1   Azelastine  HCl 137 MCG/SPRAY SOLN, USE 2 SPRAYS IN BOTH NOSTRILS  TWICE DAILY, Disp: 60 mL, Rfl: 4   Bepotastine  Besilate 1.5 % SOLN, Place 1 drop into both eyes daily as needed (allergies)., Disp: , Rfl:    Blood Glucose Monitoring Suppl (ACCU-CHEK GUIDE) w/Device KIT, See admin instructions., Disp: , Rfl: 1  busPIRone  (BUSPAR ) 30 MG tablet, TAKE 1 TABLET BY MOUTH TWICE  DAILY, Disp: 180 tablet, Rfl: 0   Calcium -Magnesium-Zinc-Vit D3  (CALCIUM -MAGNESIUM-ZINC-D3 PO), Take 1 tablet by mouth daily., Disp: , Rfl:    clonazePAM  (KLONOPIN ) 0.5 MG tablet, Take 1/2-1 tab po TID prn anxiety. (Do not combine with Ambien ), Disp: 45 tablet, Rfl: 5   desonide  (DESOWEN ) 0.05 % ointment, APPLY TO THE AFFECTED AREA(S) TOPICALLY TWICE DAILY AS NEEDED, Disp: 180 g, Rfl: 0   diclofenac Sodium (VOLTAREN ARTHRITIS PAIN) 1 % GEL, Apply 2 g topically daily as needed (Knee pain)., Disp: , Rfl:    diltiazem  (CARDIZEM ) 60 MG tablet, Take 1 tablet (60 mg total) by mouth every 6 (six) hours as needed for palpitation episode. MAX up to 2 tablets per day., Disp: 180 tablet, Rfl: 2   diltiazem  (CARTIA  XT) 300 MG 24 hr capsule, TAKE 1 CAPSULE BY MOUTH DAILY, Disp: 90 capsule, Rfl: 3   EPINEPHrine  0.3 mg/0.3 mL IJ SOAJ injection, Inject 0.3 mg into the muscle as needed for anaphylaxis. INJECT INTRAMUSCULARLY 1  PEN AS NEEDED FOR ALLERGIC  RESPONSE AS DIRECTED BY MD. SEEK MEDICAL HELP AFTER  USE., Disp: 2 each, Rfl: 0   famotidine  (PEPCID ) 20 MG tablet, TAKE 1 TABLET BY MOUTH DAILY, Disp: 90 tablet, Rfl: 3   ferrous sulfate 325 (65 FE) MG tablet, Take 325 mg by mouth daily with breakfast., Disp: , Rfl:    fexofenadine  (ALLEGRA ) 180 MG tablet, Take 1 tablet (180 mg total) by mouth daily., Disp: 90 tablet, Rfl: 1   fluticasone  (FLONASE ) 50 MCG/ACT nasal spray, USE 2 SPRAYS IN BOTH NOSTRILS  DAILY, Disp: 48 g, Rfl: 1   Fluticasone  Furoate (ARNUITY ELLIPTA ) 100 MCG/ACT AEPB, Inhale 1 puff into the lungs daily as needed., Disp: 30 each, Rfl: 5   GEMTESA 75 MG TABS, Take 75 mg by mouth daily., Disp: , Rfl:    hydrochlorothiazide  (HYDRODIURIL ) 25 MG tablet, Take 1 tablet (25 mg total) by mouth daily., Disp: 90 tablet, Rfl: 1   hydrocortisone  2.5 % cream, Apply 1 Application topically 2 (two) times daily., Disp: 30 g, Rfl: 0   Lancets (ONETOUCH DELICA PLUS LANCET33G) MISC, SMARTSIG:1 Topical Daily, Disp: , Rfl:    lidocaine  (LIDODERM ) 5 %, Place 1 patch onto the skin  daily as needed (pain)., Disp: , Rfl:    Mepolizumab  (NUCALA ) 100 MG/ML SOAJ, Inject 1 mL (100 mg total) into the skin every 28 (twenty-eight) days., Disp: 1 mL, Rfl: 11   metFORMIN  (GLUCOPHAGE -XR) 500 MG 24 hr tablet, Take 1,000 mg by mouth in the morning and at bedtime., Disp: , Rfl:    methocarbamol  (ROBAXIN ) 500 MG tablet, Take 1 tablet (500 mg total) by mouth every 6 (six) hours as needed for muscle spasms., Disp: 30 tablet, Rfl: 0   mirtazapine  (REMERON ) 30 MG tablet, TAKE 1 TABLET BY MOUTH AT  BEDTIME, Disp: 90 tablet, Rfl: 1   montelukast  (SINGULAIR ) 10 MG tablet, TAKE 1 TABLET BY MOUTH AT  BEDTIME, Disp: 100 tablet, Rfl: 2   Nebulizer MISC, 1 Device by Does not apply route as needed., Disp: 1 each, Rfl: 1   Oxcarbazepine  (TRILEPTAL ) 300 MG tablet, TAKE 2 TABLETS BY MOUTH AT  BEDTIME, Disp: 180 tablet, Rfl: 1   pantoprazole  (PROTONIX ) 40 MG tablet, TAKE 1 TABLET BY MOUTH EVERY  MORNING, Disp: 90 tablet, Rfl: 3   potassium chloride  SA (KLOR-CON  M) 20 MEQ tablet, Take 1 tablet (20 mEq total) by mouth daily., Disp: 90 tablet,  Rfl: 1   pravastatin  (PRAVACHOL ) 20 MG tablet, Take 1 tablet (20 mg total) by mouth at bedtime., Disp: 90 tablet, Rfl: 3   pregabalin  (LYRICA ) 100 MG capsule, Take 100 mg by mouth 3 (three) times daily., Disp: , Rfl:    Respiratory Therapy Supplies (NEBULIZER) DEVI, Use as directed with nebulizer solution., Disp: 1 each, Rfl: 1   Respiratory Therapy Supplies (NEBULIZER/TUBING/MOUTHPIECE) KIT, Use as directed with nebulizer machine, Disp: 1 kit, Rfl: 12   Semaglutide , 1 MG/DOSE, (OZEMPIC , 1 MG/DOSE,) 2 MG/1.5ML SOPN, Take 1 mg by mouth once a week., Disp: , Rfl:    Spacer/Aero-Holding Chambers (BREATHE COMFORT CHAMBER/ADULT) DEVI, 1 each by Does not apply route daily as needed., Disp: 1 each, Rfl: 1   SYMBICORT  160-4.5 MCG/ACT inhaler, INHALE 2 INHALATIONS BY MOUTH  INTO THE LUNGS IN THE MORNING  AND AT BEDTIME, Disp: 30.6 g, Rfl: 3   traMADol (ULTRAM) 50 MG tablet, 1  tablet as needed Oral twice a day; Duration: 12 days, Disp: , Rfl:    triamcinolone  ointment (KENALOG ) 0.1 %, Apply 1 Application topically 2 (two) times daily., Disp: 30 g, Rfl: 0   azithromycin  (ZITHROMAX ) 250 MG tablet, Take first 2 tablets together, then 1 every day until finished. (Patient not taking: Reported on 06/27/2024), Disp: 6 tablet, Rfl: 0   benzonatate  (TESSALON ) 100 MG capsule, Take 1 capsule (100 mg total) by mouth every 8 (eight) hours as needed for cough. (Patient not taking: Reported on 06/27/2024), Disp: 21 capsule, Rfl: 0   Epinastine HCl 0.05 % ophthalmic solution, INSTILL 1 DROP INTO BOTH EYES  DAILY AS NEEDED FOR ITCHY/WATERY EYES (Patient not taking: Reported on 09/27/2024), Disp: 15 mL, Rfl: 0   ipratropium (ATROVENT ) 0.06 % nasal spray, USE 2 SPRAYS IN BOTH NOSTRILS 4  TIMES DAILY AS NEEDED FOR  RHINITIS (Patient not taking: Reported on 09/27/2024), Disp: 90 mL, Rfl: 1   oxyCODONE  (ROXICODONE ) 5 MG immediate release tablet, Take 1 tablet (5 mg total) by mouth every 4 (four) hours as needed for severe pain (pain score 7-10). (Patient not taking: Reported on 09/27/2024), Disp: 30 tablet, Rfl: 0   triamcinolone  (NASACORT ) 55 MCG/ACT AERO nasal inhaler, Place 2 sprays into the nose daily. (Patient not taking: Reported on 09/27/2024), Disp: 16.9 mL, Rfl: 5   zolpidem  (AMBIEN ) 10 MG tablet, Take 0.5-1 tablets (5-10 mg total) by mouth at bedtime as needed for sleep., Disp: 90 tablet, Rfl: 0 Medication Side Effects: none  Family Medical/ Social History: Changes? No  MENTAL HEALTH EXAM:  There were no vitals taken for this visit.There is no height or weight on file to calculate BMI.  General Appearance: Casual and Well Groomed  Eye Contact:  Good  Speech:  Clear and Coherent and Normal Rate  Volume:  Normal  Mood:  Euthymic  Affect:  Congruent  Thought Process:  Goal Directed and Descriptions of Associations: Circumstantial  Orientation:  Full (Time, Place, and Person)   Thought Content: Logical   Suicidal Thoughts:  No  Homicidal Thoughts:  No  Memory:  WNL  Judgement:  Good  Insight:  Good  Psychomotor Activity:  Normal  Concentration:  Concentration: Good  Recall:  Good  Fund of Knowledge: Good  Language: Good  Assets:  Communication Skills Desire for Improvement Financial Resources/Insurance Housing Resilience  ADL's:  Intact  Cognition: WNL  Prognosis:  Good   PCP follows labs  DIAGNOSES:    ICD-10-CM   1. Bipolar II disorder (HCC)  F31.81  2. Primary insomnia  F51.01 zolpidem  (AMBIEN ) 10 MG tablet     Receiving Psychotherapy: Yes with Marval Bunde, LCSW  RECOMMENDATIONS:   PDMP reviewed.  Ambien  09/02/2024. Tramadol filled 09/03/2024. I provided approximately  20 minutes of non-face-to-face time during this encounter, including time spent before and after the visit in records review, medical decision making, counseling pertinent to today's visit, and charting.   Camesha is doing well on her current medications and no changes will be made.  Continue Abilify  2 mg, 2 p.o. daily. Continue BuSpar  30 mg, 1 p.o. twice daily. Continue Klonopin  0.5 mg, 1/2-1 p.o. 3 times daily as needed. Continue mirtazapine  30 mg, 1 p.o. nightly. Continue Trileptal  300 mg, 2 p.o. nightly. Continue Ambien  10 mg, 1/2-1 p.o. nightly as needed sleep. Continue therapy with Marval Bunde, LCSW. Return in 4 months.     Verneita Cooks, PA-C

## 2024-10-06 ENCOUNTER — Other Ambulatory Visit: Payer: Self-pay | Admitting: Physician Assistant

## 2024-10-06 DIAGNOSIS — F419 Anxiety disorder, unspecified: Secondary | ICD-10-CM

## 2024-10-07 ENCOUNTER — Other Ambulatory Visit (HOSPITAL_COMMUNITY): Payer: Self-pay | Admitting: *Deleted

## 2024-10-07 ENCOUNTER — Encounter (HOSPITAL_COMMUNITY): Payer: Self-pay

## 2024-10-07 ENCOUNTER — Ambulatory Visit (HOSPITAL_COMMUNITY)
Admission: RE | Admit: 2024-10-07 | Discharge: 2024-10-07 | Disposition: A | Discharge status: Home / Self Care | Source: Ambulatory Visit | Attending: Cardiology | Admitting: Cardiology

## 2024-10-07 DIAGNOSIS — I77810 Thoracic aortic ectasia: Secondary | ICD-10-CM

## 2024-10-07 DIAGNOSIS — I480 Paroxysmal atrial fibrillation: Secondary | ICD-10-CM

## 2024-10-07 MED ORDER — DIPHENHYDRAMINE HCL 50 MG PO TABS: ORAL_TABLET | ORAL | 0 refills | Status: AC

## 2024-10-07 MED ORDER — PREDNISONE 50 MG PO TABS: ORAL_TABLET | ORAL | 0 refills | Status: AC

## 2024-10-09 ENCOUNTER — Ambulatory Visit (HOSPITAL_COMMUNITY)
Admission: RE | Admit: 2024-10-09 | Discharge: 2024-10-09 | Disposition: A | Discharge status: Home / Self Care | Source: Ambulatory Visit | Attending: Cardiology | Admitting: Cardiology

## 2024-10-09 DIAGNOSIS — I480 Paroxysmal atrial fibrillation: Secondary | ICD-10-CM | POA: Diagnosis present

## 2024-10-09 DIAGNOSIS — I77810 Thoracic aortic ectasia: Secondary | ICD-10-CM | POA: Diagnosis present

## 2024-10-09 MED ORDER — IOHEXOL 350 MG/ML SOLN
75.0000 mL | Freq: Once | INTRAVENOUS | Status: AC | PRN
  Administered 2024-10-09: 75 mL via INTRAVENOUS

## 2024-10-14 ENCOUNTER — Ambulatory Visit: Payer: Self-pay | Admitting: Cardiology

## 2024-10-24 ENCOUNTER — Ambulatory Visit (INDEPENDENT_AMBULATORY_CARE_PROVIDER_SITE_OTHER): Admitting: Psychiatry

## 2024-10-24 DIAGNOSIS — F419 Anxiety disorder, unspecified: Secondary | ICD-10-CM

## 2024-10-24 NOTE — Progress Notes (Signed)
 [facility] Counselor/Therapist Progress Note  Patient ID: Chelsea Benson, MRN: [id number],    Date: 10/24/2024  Time Spent: 50 minutes   Treatment Type: Individual Therapy  Virtual Visit via Telehealth Note: MyChart Video Connected with patient by a telemedicine/telehealth application, with their informed consent, and verified patient privacy and that I am speaking with the correct person using two identifiers. I discussed the limitations, risks, security and privacy concerns of performing psychotherapy and the availability of in person appointments. I also discussed with the patient that there may be a patient responsible charge related to this service. The patient expressed understanding and agreed to proceed. I discussed the treatment planning with the patient. The patient was provided an opportunity to ask questions and all were answered. The patient agreed with the plan and demonstrated an understanding of the instructions. The patient was advised to call  our office if  symptoms worsen or feel they are in a crisis state and need immediate contact.   Therapist Location: office Patient Location: home   Reported Symptoms: anxiety, not stressed so much today, less worrying, not depressed   Mental Status Exam:  Appearance:   Casual     Behavior:  Appropriate  Motor:  Uses cane or walker in walking; also uses a back brace for now (after her surgery)  Speech/Language:   Clear and Coherent  Affect:  Appropriate  Mood:  anxious  Thought process:  goal directed  Thought content:    WNL  Sensory/Perceptual disturbances:    WNL  Orientation:  oriented to person, place, time/date, situation, day of week, month of year, year, and stated date of Nov. 20, 2025  Attention:  Good  Concentration:  Good  Memory:  WNL  Fund of knowledge:   Good  Insight:    Good  Judgment:   Good  Impulse Control:  Good   Risk Assessment: Danger to Self:  No Self-injurious Behavior: No Danger  to Others: No Duty to Warn:no Physical Aggression / Violence:No  Access to Firearms a concern: No  Gang Involvement:No   Subjective:   Patient in her telehealth video session today and reports better mood. States she hasn't needed her Lorazepam quite as much as in previous times but am taking it at least once every day in the afternoon and have been doing fine. Planning to have family visit over Thanksgiving and looking forward to that time. Trying to worry less and adds that she stresses and worries mostly over world violence, some family concerns, but does feel overall she is doing better at managing some of her stress and handling it better and I'm trying not to go off on people. Feeling more positive about some increase in her ability to not always respond when someone pisses me off, and instead has been practicing some calming self-talk and the ability to speak to other in a nicer way. Some improvement needed in my boundaries and discussed how she might practice some strategies re: boundaries between now and next therapy. Alcoholic brother has not stopped his drinking but has cut back, and continues to live with patient.  Interventions: Cognitive Behavioral Therapy, Solution-Oriented/Positive Psychology, and Ego-Supportive Long term goal: (measurable) Reduce overall level, frequency, and intensity of the anxiety so that daily functioning is not impaired.  Patient will eventually report a rating of 4 or less on 1-10 anxiety scale for a 85-month time period where her daily functioning is not impaired.  Short term goal: Increase understanding of beliefs and  messages that produce the worry and anxiety. Strategy: Patient will explore and work to stop cognitive messages that feed anxiety and work to replace them with more positive and empowering messages.    Diagnosis:   ICD-10-CM   1. Anxiety disorder, unspecified type  F41.9      Plan:   Patient today working well on her goals and  noticing more of her progress.  Progressing emotionally and physically which she feels good about.  Setting better boundaries at times and needs to continue her work on this.  Very mindful of taking better care of herself and working on decreasing her stress at times and being able to say no when she needs to say no to others, along with further decreasing her worrying and overthinking.  Does have some supportive family which helps.  Patient definitely continues to feel encouraged.  Goal review and progress/challenges noted with patient.  Next appointment within approximately 4 weeks.   Barnie Bunde, LCSW

## 2024-10-28 ENCOUNTER — Other Ambulatory Visit: Payer: Self-pay | Admitting: Allergy

## 2024-10-28 ENCOUNTER — Other Ambulatory Visit: Payer: Self-pay | Admitting: Cardiology

## 2024-10-28 DIAGNOSIS — I480 Paroxysmal atrial fibrillation: Secondary | ICD-10-CM

## 2024-10-29 ENCOUNTER — Telehealth: Payer: Self-pay | Admitting: Allergy

## 2024-10-29 NOTE — Telephone Encounter (Signed)
 Pt called and stated [employer] needs a form stating she needs to use Symbicort  and requested a call back.

## 2024-10-30 ENCOUNTER — Telehealth: Payer: Self-pay | Admitting: Allergy

## 2024-11-04 NOTE — Telephone Encounter (Signed)
 Prescription refill request for Eliquis  received. Indication: A. FIB Last office visit: 03/09/24 Scr: 0.75 (04/02/24 EPIC) Age: 73 Weight: 79.4 kg  Per protocol okay to refill at current dose.

## 2024-11-08 NOTE — Telephone Encounter (Signed)
 I called patient to get more clarity about form for insurance. Had to leave voicemail.

## 2024-11-10 ENCOUNTER — Other Ambulatory Visit: Payer: Self-pay | Admitting: Allergy

## 2024-11-11 ENCOUNTER — Telehealth: Payer: Self-pay | Admitting: Physician Assistant

## 2024-11-11 DIAGNOSIS — F419 Anxiety disorder, unspecified: Secondary | ICD-10-CM

## 2024-11-11 MED ORDER — BUSPIRONE HCL 30 MG PO TABS: 30.0000 mg | ORAL_TABLET | Freq: Two times a day (BID) | ORAL | 0 refills | Status: AC

## 2024-11-11 NOTE — Telephone Encounter (Signed)
 Pt called requesting Rx to [facility] for Buspar . Apt 2/24

## 2024-11-11 NOTE — Telephone Encounter (Signed)
 Sent!

## 2024-11-13 ENCOUNTER — Other Ambulatory Visit: Payer: Self-pay | Admitting: Allergy

## 2024-11-13 ENCOUNTER — Telehealth: Payer: Self-pay | Admitting: Allergy

## 2024-11-13 NOTE — Telephone Encounter (Signed)
 I called and spoke with the patient, she stated that she spoke with the insurance company this morning and they are going to pay for the Symbicort  and a form no longer needs to be filled out. I advised that if she needs anything to please let us  know. Patient verbalized understanding.

## 2024-11-13 NOTE — Telephone Encounter (Signed)
 error

## 2024-11-13 NOTE — Telephone Encounter (Signed)
 Left voicemail to give the office a call back to schedule Nucala reapproval appointment.

## 2024-11-21 ENCOUNTER — Telehealth: Payer: Self-pay | Admitting: Allergy

## 2024-11-21 NOTE — Telephone Encounter (Signed)
 I called the patient about the neb tubing. I left a message to call the office back.

## 2024-11-21 NOTE — Telephone Encounter (Signed)
 Chelsea Benson called and stated that she needs new tubing for her nebulizer. She states she has tried to go directly through the supply company but it has been over 2 months and she still has not been able to get any.

## 2024-11-22 ENCOUNTER — Ambulatory Visit: Admitting: Podiatry

## 2024-11-26 ENCOUNTER — Ambulatory Visit: Admitting: Psychiatry

## 2024-11-26 DIAGNOSIS — F419 Anxiety disorder, unspecified: Secondary | ICD-10-CM | POA: Diagnosis not present

## 2024-11-26 NOTE — Progress Notes (Signed)
 "       [facility] Counselor/Therapist Progress Note  Patient ID: Chelsea Benson, MRN: [id number],    Date: 11/26/2024  Time Spent: 48 minutes   Treatment Type: Individual Therapy  Virtual Visit via Telehealth Note: MyChartVideo session with patient Connected with patient by a telemedicine/telehealth application, with their informed consent, and verified patient privacy and that I am speaking with the correct person using two identifiers. I discussed the limitations, risks, security and privacy concerns of performing psychotherapy and the availability of in person appointments. I also discussed with the patient that there may be a patient responsible charge related to this service. The patient expressed understanding and agreed to proceed. I discussed the treatment planning with the patient. The patient was provided an opportunity to ask questions and all were answered. The patient agreed with the plan and demonstrated an understanding of the instructions. The patient was advised to call  our office if  symptoms worsen or feel they are in a crisis state and need immediate contact.   Therapist Location: office Patient Location: home   Reported Symptoms: anxiety, less depression   Mental Status Exam:  Appearance:   Casual and Neat     Behavior:  Appropriate, Sharing, and Motivated  Motor:  Uses her back brace and rollator  Speech/Language:   Clear and Coherent  Affect:  Depressed; anxious  Mood:  Anxiety, some depression  Thought process:  goal directed  Thought content:    WNL  Sensory/Perceptual disturbances:    WNL  Orientation:  oriented to person, place, time/date, situation, day of week, month of year, year, and stated date of Dec. 23, 2025  Attention:  Good  Concentration:  Good  Memory:  WNL  Fund of knowledge:   Good  Insight:    Good  Judgment:   Good  Impulse Control:  Good   Risk Assessment: Danger to Self:  No Self-injurious Behavior: No Danger to Others:  No Duty to Warn:no Physical Aggression / Violence:No  Access to Firearms a concern: No  Gang Involvement:No   Subjective:  Patient today reporting anxiety, some depression but looking forward to being with family at the upcoming holidays. Mood has been a little more even keel. But I did get upset this past week about my insurance for next year but states it is getting worked out and she understands it better now. Reports she is sleeping ok, does wake up some but go right back to sleep.  Having some foot issues that she is getting checked out due to a bunion.  Able to talk through some of her anxiety and depression today in session, most of which is related to family, personal health, and all the bad things that have been in the world.  States that she does feel safe and has relatively close neighbors.  Alcoholic brother does still live with her but not a close relationship.  Has a couple of friends that she stays in touch with daily on the phone and has a worker that comes in several times a week to help with things around the house.  Adds that she does get out for her appointments when needed but not a lot of extra getting out.  Feels that she is better and managing the relationship with her alcoholic brother.  Continue to encourage her in maintaining boundaries that feel healthy for her, and also and reaching out to others that are helpful to her and provide visits or transportation as needed, including family.  Interventions: Cognitive Behavioral Therapy, Solution-Oriented/Positive Psychology, and Ego-Supportive Long term goal: (measurable) Reduce overall level, frequency, and intensity of the anxiety so that daily functioning is not impaired.  Patient will eventually report a rating of 4 or less on 1-10 anxiety scale for a 41-month time period where her daily functioning is not impaired.  Short term goal: Increase understanding of beliefs and messages that produce the worry and  anxiety. Strategy: Patient will explore and work to stop cognitive messages that feed anxiety and work to replace them with more positive and empowering messages.   Diagnosis:   ICD-10-CM   1. Anxiety disorder, unspecified type  F41.9      Plan: Patient working really well and in session today, reporting anxiety and some depression.  Feels that she is managing it okay for now and looking forward to family coming in to visit at Christmas.  States she does feel she is progressing especially in the way she handles stressors, and in the way she is able to get information on things as needed regarding her health and wellbeing.  Very good and following through on health recommendations from her physicians and taking her medications as prescribed.  Continue to work on healthier boundaries in specific situations.  (Not all details included in this note due to patient privacy needs.)  Having some supportive family members certainly helps patient's outlook.   Goal review and progress/challenges noted with patient.  Next appointment within approximately 4 weeks.   Barnie Bunde, LCSW                   "

## 2024-11-26 NOTE — Addendum Note (Signed)
 Addended by: Gizell Danser on: 11/26/2024 11:10 AM   Modules accepted: Level of Service

## 2024-11-29 ENCOUNTER — Other Ambulatory Visit: Payer: Self-pay | Admitting: Cardiology

## 2024-12-02 ENCOUNTER — Telehealth: Payer: Self-pay | Admitting: Allergy

## 2024-12-02 NOTE — Telephone Encounter (Signed)
 Chelsea Benson called in and states she needs new tubes for her nebulizer.  I informed her she had to call [facility].  Chelsea Benson states she called them and they never return her calls.  When she finally got someone, they told her to call us .  She doesn't need a mask, she just needs tubing.  Is this something we have here or does she still have to go through [facility]?

## 2024-12-03 ENCOUNTER — Other Ambulatory Visit: Payer: Self-pay

## 2024-12-03 MED ORDER — NEBULIZER/TUBING/MOUTHPIECE KIT: 1.0000 | PACK | 12 refills | Status: AC | PRN

## 2024-12-03 NOTE — Telephone Encounter (Signed)
 Duplicate message I sent rx to [facility] and patient a my chart message

## 2024-12-03 NOTE — Telephone Encounter (Signed)
 I sent in  rx to [facility] mail order and a my chart message to patient.

## 2024-12-04 NOTE — Telephone Encounter (Signed)
 My chart message sent and tubing left at front

## 2024-12-06 ENCOUNTER — Telehealth: Payer: Self-pay | Admitting: Allergy

## 2024-12-06 NOTE — Telephone Encounter (Signed)
 Chelsea Benson called and stated that she needs new tubing for her nebulizer machine, and that Dr. Jeneal sent in orders to [facility] for it, but they do not carry it. She is still in need of tubing.

## 2024-12-10 NOTE — Telephone Encounter (Signed)
 Called and spoke with the patient and she stated that the nebulizer is over 73 years old, I informed the patient that she can come in and get a new nebulizer since that one is so old. Patient verbalized understanding, she stated that she is waiting for her insurance card to come in the mail then she will come in and pick up the new nebulizer. The nebulizer has been placed up front for pickup. Patient verbalized understanding.

## 2024-12-13 ENCOUNTER — Other Ambulatory Visit: Payer: Self-pay | Admitting: Cardiology

## 2024-12-19 ENCOUNTER — Telehealth: Payer: Self-pay | Admitting: Physician Assistant

## 2024-12-19 NOTE — Telephone Encounter (Signed)
Updated pharmacy profile.

## 2024-12-19 NOTE — Telephone Encounter (Signed)
 Chelsea Benson called and said that she has a new pharmacy since she has new insurance. She will now be using [facility] [address] [place], [place] [address]

## 2024-12-20 ENCOUNTER — Telehealth: Payer: Self-pay | Admitting: Cardiology

## 2024-12-20 NOTE — Telephone Encounter (Signed)
" °*  STAT* If patient is at the pharmacy, call can be transferred to refill team.   1. Which medications need to be refilled? (please list name of each medication and dose if known)   apixaban  (ELIQUIS ) 5 MG TABS tablet     2. Would you like to learn more about the convenience, safety, & potential cost savings by using the [facility]? No     3. Are you open to using the [facility] (Type [facility].  ). No    4. Which pharmacy/location (including street and city if local pharmacy) is medication to be sent to?[facility] - [place], [place] - [facility] AT Portal to [facility]    5. Do they need a 30 day or 90 day supply? 90    "

## 2024-12-25 NOTE — Telephone Encounter (Signed)
 Refill was sent 11/04/2024.

## 2024-12-26 ENCOUNTER — Telehealth: Payer: Self-pay | Admitting: Allergy

## 2024-12-26 DIAGNOSIS — J4540 Moderate persistent asthma, uncomplicated: Secondary | ICD-10-CM

## 2024-12-26 NOTE — Telephone Encounter (Signed)
 PT called stating that the insurance will be sending out a prior authorization form for Nucala  and Symbicort . To inform the clinical staff

## 2024-12-31 ENCOUNTER — Ambulatory Visit: Admitting: Psychiatry

## 2025-01-01 ENCOUNTER — Ambulatory Visit: Admitting: Psychiatry

## 2025-01-01 DIAGNOSIS — F419 Anxiety disorder, unspecified: Secondary | ICD-10-CM

## 2025-01-01 MED ORDER — SYMBICORT 160-4.5 MCG/ACT IN AERO: INHALATION_SPRAY | RESPIRATORY_TRACT | 0 refills | Status: AC

## 2025-01-01 NOTE — Progress Notes (Addendum)
 "       [facility] Counselor/Therapist Progress Note  Patient ID: Chelsea Benson, MRN: [id number],    Date: 01/01/2025  Time Spent: 45 minutes   Treatment Type: Individual Therapy  Virtual Visit via Telehealth Note Connected with patient by a telemedicine/telehealth application, with their informed consent, and verified patient privacy and that I am speaking with the correct person using two identifiers. I discussed the limitations, risks, security and privacy concerns of performing psychotherapy and the availability of in person appointments. I also discussed with the patient that there may be a patient responsible charge related to this service. The patient expressed understanding and agreed to proceed. I discussed the treatment planning with the patient. The patient was provided an opportunity to ask questions and all were answered. The patient agreed with the plan and demonstrated an understanding of the instructions. The patient was advised to call  our office if  symptoms worsen or feel they are in a crisis state and need immediate contact.   Therapist Location: office Patient Location: home    Reported Symptoms: anxiety is my main symptom, some depression re: oldest daughter's mental health issues.   Mental Status Exam:   Appearance:   Casual     Behavior:  Appropriate, Sharing, and Motivated  Motor:  Uses walker  Speech/Language:   Clear and Coherent  Affect:  Anxious, depressed  Mood:  anxious and depressed  Thought process:  goal directed  Thought content:    Rumination  Sensory/Perceptual disturbances:    WNL  Orientation:  oriented to person, place, time/date, situation, day of week, month of year, year, and stated date of Jan.28, 2026  Attention:  Good  Concentration:  Good  Memory:  WNL  Fund of knowledge:   Good and Fair  Insight:    Good and Fair  Judgment:   Good  Impulse Control:  Good and Fair   Risk Assessment: Danger to Self:  No Self-injurious  Behavior: No Danger to Others: No Duty to Warn:no Physical Aggression / Violence:No  Access to Firearms a concern: No  Gang Involvement:No   Subjective:  Processing anxiety today in session about recent snowstorm, but mostly related to some family and interpersonal issues, trying not to worry about my adult kids. I try to not worry much but I can't just stop being their mom.  Talked about her working to reduce her worry and anxiety but she questions whether or not she can really change her worrying . Encouraged patient to give some anxiety-reduction skills a chance and shared some ways that could happen. Vented a lot today about her family today, some nervousness about another snow storm on the way in a couple days. Enjoys watching college and NBA basketball for fun, along with reaching out to family and friends, watching soap operas, talking with my aide that comes by 5 times a week. Review of treatment goals and patient reports staying motivated.   Interventions: Cognitive Behavioral Therapy, Solution-Oriented/Positive Psychology, and Ego-Supportive Long term goal: (measurable) Reduce overall level, frequency, and intensity of the anxiety so that daily functioning is not impaired. Patient will eventually report a rating of 3 or less on 1-10 anxiety scale for at least a 2 month period where her daily functioning is not impaired.  Short term goal: Increase understanding of beliefs and messages that produce the worry and anxiety.  Strategy: Patient will explore and work to stop cognitive messages that feed anxiety and work to replace them with more positive and empowering  messages.    Diagnosis:   ICD-10-CM   1. Anxiety disorder, unspecified type  F41.9      Plan:  Patient to continue her work on treatment goals, noting her progress as well as her challenges. Is good in recognizing her needs and will contact someone for help when needed or wanted. Very alert and interactive in session  today, noting some progress and also some challenges with her goals which she reports continued efforts, and able to identify the positives and the negatives, and what she can change versus what she cannot change. Encouraged her remaining connected to helpful family and friends.  Goal review and progress noted with patient.  Next appt within 3 weeks.   Barnie Bunde, LCSW                   "

## 2025-01-01 NOTE — Addendum Note (Signed)
 Addended by: MARCINE ISAIAH CROME on: 01/01/2025 12:05 PM   Modules accepted: Orders

## 2025-01-01 NOTE — Telephone Encounter (Signed)
 Chelsea Benson called again stating it is almost time for her to have her Nucala  again, and that she is still in need of her Symbicort  as well

## 2025-01-02 MED ORDER — BUDESONIDE-FORMOTEROL FUMARATE 160-4.5 MCG/ACT IN AERO: 2.0000 | INHALATION_SPRAY | Freq: Two times a day (BID) | RESPIRATORY_TRACT | 2 refills | Status: AC

## 2025-01-02 NOTE — Telephone Encounter (Signed)
 Received fax from [facility] to complete PA for Symbicort . It does state that Breyna  is a covered alternative. Sending in new Rx. Will check with pharmacy after giving them time to process.

## 2025-01-02 NOTE — Addendum Note (Signed)
 Addended by: ONEITA CHRISTIANS D on: 01/02/2025 03:38 PM   Modules accepted: Orders

## 2025-01-02 NOTE — Telephone Encounter (Signed)
 Spoke with [facility] and the Brand Symbicort  was covered and they had to order it and it will be ready tomorrow for no charge. Tried calling to inform patient, no answer. Left detailed message per DPR.

## 2025-01-03 NOTE — Telephone Encounter (Signed)
 PT called to advise received PA for Symbicort , need to call [phone].  I advised seems like Leontine had already taken care of situation and that Rx should be good for pickup, PT thanked

## 2025-01-06 ENCOUNTER — Other Ambulatory Visit: Payer: Self-pay | Admitting: Physician Assistant

## 2025-01-06 DIAGNOSIS — F5101 Primary insomnia: Secondary | ICD-10-CM

## 2025-01-06 DIAGNOSIS — F3181 Bipolar II disorder: Secondary | ICD-10-CM

## 2025-01-07 NOTE — Telephone Encounter (Signed)
 Still waiting on patient to come in for MD appt message left for patient Dec

## 2025-01-10 ENCOUNTER — Ambulatory Visit: Admitting: Family Medicine

## 2025-01-28 ENCOUNTER — Telehealth: Admitting: Physician Assistant

## 2025-01-29 ENCOUNTER — Ambulatory Visit: Admitting: Psychiatry

## 2025-02-18 ENCOUNTER — Ambulatory Visit: Admitting: Allergy & Immunology

## 2025-02-20 ENCOUNTER — Ambulatory Visit: Admitting: Family Medicine

## 2025-03-12 ENCOUNTER — Ambulatory Visit: Admitting: Cardiology
